# Patient Record
Sex: Male | Born: 1940 | Race: White | Hispanic: No | Marital: Married | Smoking: Never smoker
Health system: Southern US, Community
[De-identification: ages and names within clinical notes are randomized; demographics above are authoritative.]

## PROBLEM LIST (undated history)

## (undated) VITALS — BP 137/74 | HR 76 | Temp 96.8°F | Resp 18 | Ht 68.0 in | Wt 251.0 lb

## (undated) VITALS — BP 136/80 | HR 94 | Temp 97.5°F | Resp 16 | Ht 68.0 in | Wt 256.0 lb

## (undated) DIAGNOSIS — M1990 Unspecified osteoarthritis, unspecified site: Secondary | ICD-10-CM

## (undated) DIAGNOSIS — Z87442 Personal history of urinary calculi: Secondary | ICD-10-CM

## (undated) DIAGNOSIS — I447 Left bundle-branch block, unspecified: Secondary | ICD-10-CM

## (undated) DIAGNOSIS — R7303 Prediabetes: Secondary | ICD-10-CM

## (undated) DIAGNOSIS — Z973 Presence of spectacles and contact lenses: Secondary | ICD-10-CM

## (undated) DIAGNOSIS — Z8614 Personal history of Methicillin resistant Staphylococcus aureus infection: Secondary | ICD-10-CM

## (undated) DIAGNOSIS — K219 Gastro-esophageal reflux disease without esophagitis: Secondary | ICD-10-CM

## (undated) DIAGNOSIS — F319 Bipolar disorder, unspecified: Secondary | ICD-10-CM

## (undated) DIAGNOSIS — F419 Anxiety disorder, unspecified: Secondary | ICD-10-CM

## (undated) DIAGNOSIS — J45909 Unspecified asthma, uncomplicated: Secondary | ICD-10-CM

## (undated) DIAGNOSIS — F329 Major depressive disorder, single episode, unspecified: Secondary | ICD-10-CM

## (undated) DIAGNOSIS — H269 Unspecified cataract: Secondary | ICD-10-CM

## (undated) DIAGNOSIS — IMO0001 Reserved for inherently not codable concepts without codable children: Secondary | ICD-10-CM

## (undated) DIAGNOSIS — I10 Essential (primary) hypertension: Secondary | ICD-10-CM

## (undated) DIAGNOSIS — K602 Anal fissure, unspecified: Secondary | ICD-10-CM

## (undated) DIAGNOSIS — H3530 Unspecified macular degeneration: Secondary | ICD-10-CM

## (undated) DIAGNOSIS — H9190 Unspecified hearing loss, unspecified ear: Secondary | ICD-10-CM

## (undated) DIAGNOSIS — F32A Depression, unspecified: Secondary | ICD-10-CM

## (undated) DIAGNOSIS — T884XXA Failed or difficult intubation, initial encounter: Secondary | ICD-10-CM

## (undated) DIAGNOSIS — E785 Hyperlipidemia, unspecified: Secondary | ICD-10-CM

## (undated) HISTORY — PX: ANKLE SURGERY: SHX546

## (undated) HISTORY — PX: ORIF ELBOW FRACTURE: SHX5031

## (undated) HISTORY — PX: CARDIAC CATHETERIZATION: SHX172

## (undated) HISTORY — DX: Failed or difficult intubation, initial encounter: T88.4XXA

## (undated) HISTORY — PX: INGUINAL HERNIA REPAIR: SHX194

## (undated) HISTORY — DX: Unspecified macular degeneration: H35.30

## (undated) HISTORY — PX: TONSILLECTOMY: SUR1361

## (undated) HISTORY — DX: Gastro-esophageal reflux disease without esophagitis: K21.9

## (undated) HISTORY — DX: Anal fissure, unspecified: K60.2

## (undated) HISTORY — DX: Left bundle-branch block, unspecified: I44.7

## (undated) HISTORY — PX: KNEE SURGERY: SHX244

## (undated) HISTORY — DX: Essential (primary) hypertension: I10

## (undated) HISTORY — DX: Unspecified cataract: H26.9

## (undated) HISTORY — PX: TOTAL KNEE ARTHROPLASTY: SHX125

## (undated) HISTORY — PX: ANKLE FUSION: SHX5718

## (undated) HISTORY — PX: ELBOW SURGERY: SHX618

## (undated) HISTORY — PX: SHOULDER ARTHROSCOPY: SHX128

## (undated) HISTORY — DX: Depression, unspecified: F32.A

## (undated) HISTORY — DX: Bipolar disorder, unspecified: F31.9

## (undated) HISTORY — DX: Major depressive disorder, single episode, unspecified: F32.9

---

## 2003-08-19 ENCOUNTER — Encounter: Admission: RE | Admit: 2003-08-19 | Discharge: 2003-08-19 | Payer: Self-pay | Admitting: Family Medicine

## 2003-11-14 ENCOUNTER — Ambulatory Visit (HOSPITAL_COMMUNITY): Admission: RE | Admit: 2003-11-14 | Discharge: 2003-11-14 | Payer: Self-pay | Admitting: Cardiology

## 2005-03-31 ENCOUNTER — Inpatient Hospital Stay (HOSPITAL_COMMUNITY): Admission: RE | Admit: 2005-03-31 | Discharge: 2005-04-03 | Payer: Self-pay | Admitting: Psychiatry

## 2005-03-31 ENCOUNTER — Ambulatory Visit: Payer: Self-pay | Admitting: Psychiatry

## 2005-04-11 ENCOUNTER — Ambulatory Visit (HOSPITAL_COMMUNITY): Payer: Self-pay | Admitting: Psychiatry

## 2005-04-21 ENCOUNTER — Ambulatory Visit (HOSPITAL_COMMUNITY): Payer: Self-pay | Admitting: Psychiatry

## 2005-05-17 ENCOUNTER — Ambulatory Visit (HOSPITAL_COMMUNITY): Payer: Self-pay | Admitting: Psychiatry

## 2005-05-30 ENCOUNTER — Emergency Department (HOSPITAL_COMMUNITY): Admission: EM | Admit: 2005-05-30 | Discharge: 2005-05-30 | Payer: Self-pay | Admitting: Emergency Medicine

## 2005-06-13 ENCOUNTER — Ambulatory Visit (HOSPITAL_COMMUNITY): Payer: Self-pay | Admitting: Psychiatry

## 2005-07-14 ENCOUNTER — Ambulatory Visit (HOSPITAL_COMMUNITY): Payer: Self-pay | Admitting: Psychiatry

## 2005-09-20 ENCOUNTER — Ambulatory Visit (HOSPITAL_COMMUNITY): Payer: Self-pay | Admitting: Psychiatry

## 2005-10-10 ENCOUNTER — Ambulatory Visit (HOSPITAL_COMMUNITY): Payer: Self-pay | Admitting: Psychiatry

## 2005-11-09 ENCOUNTER — Ambulatory Visit (HOSPITAL_COMMUNITY): Payer: Self-pay | Admitting: Psychiatry

## 2006-01-09 ENCOUNTER — Ambulatory Visit (HOSPITAL_COMMUNITY): Payer: Self-pay | Admitting: Psychiatry

## 2006-02-02 ENCOUNTER — Ambulatory Visit (HOSPITAL_COMMUNITY): Payer: Self-pay | Admitting: Psychiatry

## 2006-03-02 ENCOUNTER — Ambulatory Visit (HOSPITAL_COMMUNITY): Payer: Self-pay | Admitting: Psychiatry

## 2006-04-26 ENCOUNTER — Ambulatory Visit (HOSPITAL_COMMUNITY): Payer: Self-pay | Admitting: Psychiatry

## 2006-05-30 ENCOUNTER — Ambulatory Visit (HOSPITAL_COMMUNITY): Payer: Self-pay | Admitting: Psychiatry

## 2006-07-26 ENCOUNTER — Ambulatory Visit (HOSPITAL_COMMUNITY): Payer: Self-pay | Admitting: Psychiatry

## 2006-09-22 ENCOUNTER — Ambulatory Visit (HOSPITAL_COMMUNITY): Payer: Self-pay | Admitting: Psychiatry

## 2006-10-06 ENCOUNTER — Ambulatory Visit (HOSPITAL_COMMUNITY): Payer: Self-pay | Admitting: Psychiatry

## 2006-11-06 ENCOUNTER — Ambulatory Visit (HOSPITAL_COMMUNITY): Payer: Self-pay | Admitting: Psychiatry

## 2006-12-11 ENCOUNTER — Ambulatory Visit (HOSPITAL_COMMUNITY): Payer: Self-pay | Admitting: Psychiatry

## 2007-02-06 ENCOUNTER — Ambulatory Visit (HOSPITAL_COMMUNITY): Payer: Self-pay | Admitting: Physician Assistant

## 2007-03-06 ENCOUNTER — Ambulatory Visit (HOSPITAL_COMMUNITY): Payer: Self-pay | Admitting: Psychiatry

## 2007-04-10 ENCOUNTER — Ambulatory Visit (HOSPITAL_COMMUNITY): Payer: Self-pay | Admitting: Psychiatry

## 2007-05-17 ENCOUNTER — Ambulatory Visit (HOSPITAL_COMMUNITY): Payer: Self-pay | Admitting: Psychiatry

## 2007-06-11 ENCOUNTER — Ambulatory Visit (HOSPITAL_COMMUNITY): Payer: Self-pay | Admitting: Psychiatry

## 2007-06-18 ENCOUNTER — Ambulatory Visit (HOSPITAL_COMMUNITY): Payer: Self-pay | Admitting: Psychiatry

## 2007-07-10 ENCOUNTER — Ambulatory Visit (HOSPITAL_COMMUNITY): Payer: Self-pay | Admitting: Psychiatry

## 2007-08-01 ENCOUNTER — Ambulatory Visit (HOSPITAL_COMMUNITY): Payer: Self-pay | Admitting: Psychiatry

## 2007-08-31 ENCOUNTER — Ambulatory Visit (HOSPITAL_COMMUNITY): Payer: Self-pay | Admitting: Psychiatry

## 2007-10-26 ENCOUNTER — Ambulatory Visit (HOSPITAL_COMMUNITY): Payer: Self-pay | Admitting: Psychiatry

## 2008-01-07 ENCOUNTER — Ambulatory Visit (HOSPITAL_COMMUNITY): Payer: Self-pay | Admitting: Psychiatry

## 2008-03-05 ENCOUNTER — Ambulatory Visit (HOSPITAL_COMMUNITY): Payer: Self-pay | Admitting: Psychiatry

## 2008-04-12 ENCOUNTER — Emergency Department (HOSPITAL_COMMUNITY): Admission: EM | Admit: 2008-04-12 | Discharge: 2008-04-12 | Payer: Self-pay | Admitting: Emergency Medicine

## 2008-05-14 ENCOUNTER — Ambulatory Visit (HOSPITAL_COMMUNITY): Payer: Self-pay | Admitting: Psychiatry

## 2008-06-27 ENCOUNTER — Ambulatory Visit (HOSPITAL_COMMUNITY): Payer: Self-pay | Admitting: Psychiatry

## 2008-08-06 ENCOUNTER — Ambulatory Visit (HOSPITAL_COMMUNITY): Payer: Self-pay | Admitting: Psychiatry

## 2008-11-04 ENCOUNTER — Ambulatory Visit (HOSPITAL_COMMUNITY): Payer: Self-pay | Admitting: Psychiatry

## 2009-02-24 ENCOUNTER — Ambulatory Visit (HOSPITAL_COMMUNITY): Payer: Self-pay | Admitting: Psychiatry

## 2009-06-17 ENCOUNTER — Ambulatory Visit (HOSPITAL_COMMUNITY): Payer: Self-pay | Admitting: Psychiatry

## 2009-09-30 ENCOUNTER — Ambulatory Visit (HOSPITAL_COMMUNITY): Payer: Self-pay | Admitting: Psychiatry

## 2009-12-29 ENCOUNTER — Ambulatory Visit (HOSPITAL_COMMUNITY): Payer: Self-pay | Admitting: Psychiatry

## 2010-03-23 ENCOUNTER — Ambulatory Visit (HOSPITAL_COMMUNITY): Payer: Self-pay | Admitting: Psychiatry

## 2010-04-15 ENCOUNTER — Ambulatory Visit (HOSPITAL_COMMUNITY): Payer: Self-pay | Admitting: Psychiatry

## 2010-04-23 ENCOUNTER — Ambulatory Visit (HOSPITAL_COMMUNITY): Payer: Self-pay | Admitting: Psychiatry

## 2010-05-07 ENCOUNTER — Ambulatory Visit (HOSPITAL_COMMUNITY): Payer: Self-pay | Admitting: Psychiatry

## 2010-05-11 ENCOUNTER — Ambulatory Visit (HOSPITAL_COMMUNITY): Payer: Self-pay | Admitting: Psychiatry

## 2010-07-30 ENCOUNTER — Ambulatory Visit (HOSPITAL_COMMUNITY): Payer: Self-pay | Admitting: Psychiatry

## 2010-11-23 ENCOUNTER — Encounter (HOSPITAL_COMMUNITY): Payer: 59 | Admitting: Physician Assistant

## 2010-11-23 DIAGNOSIS — F319 Bipolar disorder, unspecified: Secondary | ICD-10-CM

## 2010-12-24 ENCOUNTER — Encounter (HOSPITAL_COMMUNITY): Payer: 59 | Admitting: Physician Assistant

## 2011-01-07 ENCOUNTER — Encounter (HOSPITAL_COMMUNITY): Payer: 59 | Admitting: Physician Assistant

## 2011-01-07 DIAGNOSIS — F39 Unspecified mood [affective] disorder: Secondary | ICD-10-CM

## 2011-01-25 ENCOUNTER — Encounter (HOSPITAL_COMMUNITY): Payer: 59 | Admitting: Physician Assistant

## 2011-01-25 DIAGNOSIS — F39 Unspecified mood [affective] disorder: Secondary | ICD-10-CM

## 2011-01-26 ENCOUNTER — Encounter (HOSPITAL_COMMUNITY): Payer: 59 | Admitting: Physician Assistant

## 2011-02-04 NOTE — Cardiovascular Report (Signed)
NAME:  Sean Hill, Sean Hill                      ACCOUNT NO.:  [id number]   MEDICAL RECORD NO.:  [id number]                   PATIENT TYPE:  OIB   LOCATION:  2854                                 FACILITY:  [facility]   PHYSICIAN:  Salvadore Farber, M.D.             DATE OF BIRTH:  08/14/1941   DATE OF PROCEDURE:  11/14/2003  DATE OF DISCHARGE:                              CARDIAC CATHETERIZATION   PROCEDURE:  Left heart catheterization, left ventriculography, coronary  angiography.   INDICATIONS FOR PROCEDURE:  Mr. Woodfield is a 70 year old gentleman with  chest and arm discomfort occurring with exertion over the past several  months. On Monday he had an episode of similar discomfort  while arguing  with his boss. He was advised to come to the hospital  for inpatient  evaluation  and refused. He was therefore referred for outpatient diagnostic  angiography.  The patient had a cardiac catheterization [id number] reportedly demonstrating  angiographically normal coronary arteries.   DESCRIPTION OF PROCEDURE:  Informed consent was obtained. Under 1% Lidocaine  local anesthesia, a 6 French sheath was placed in the right femoral artery  using the modified Seldinger technique. Diagnostic angiography  and  ventriculography were performed using JL-4, JR-4, Sinclair Ship and  pigtail catheters.   During the procedure the patient experienced chest discomfort  identical to  his presenting symptom. He did experience a catheter-induced spasm of the  right coronary artery. However, his pain began several minutes prior to any  manipulation in the right coronary artery at a time when there was no spasm  in the coronary arteries and TIMI grade 3 flow to the entirety of the distal  vasculature.   While not related to his symptoms, intracoronary nitroglycerin was given to  relieve catheter induced spasm of the native RCA. After ventriculography the  pigtail catheter was pulled back to the aortic root.  Aortography was  performed by power injection.   The patient tolerated the procedure well and was transferred to the holding  room in stable condition. Sheaths will be removed there.   COMPLICATIONS:  None.   FINDINGS:  1. Left ventricular. 112/7/13. Ejection fraction 65% without regional wall     motion abnormality.  2. No aortic stenosis or mitral regurgitation.  3. Left main, angiographically normal.  4. Left anterior descending artery. A large vessel giving rise to a single     large diagonal branch. It is angiographically normal.  5. Circumflex.  A moderate sized vessel giving rise to a single obtuse     marginal. There was an ostial 20% lesion.  6. Right coronary artery. Large dominant vessel. It is angiographically     normal.   IMPRESSION/RECOMMENDATIONS:  1. Minimal coronary disease.  2. Normal left ventricular size and systolic  function.  3. No aortic stenosis of mitral regurgitation.  4. Arch aortogram, no evidence of aortic dissection. The left common carotid     arises from the  innominate artery. The left vertebral likely arises from     the arch just proximal to the takeoff of the left subclavian.   During the procedure the patient had chest pain identical to his presenting  pain. This clearly preceded the onset of any catheter induced spasm. There  were no electrocardiographic changes during this pain on monitor. Thus I  think we can definitively state that there is no evidence of cardiac  etiology to his chest discomfort. With the mild stenosis seen in the  circumflex, would proceed with aggressive primary prevention including  continued aspirin therapy.                                               Salvadore Farber, M.D.    WED/MEDQ  D:  11/14/2003  T:  11/15/2003  Job:  [id number]   cc:   Quita Skye. Artis Flock, M.D.  [address], [address]  [place]  [place] [address]  Fax: [phone]   Jesse Sans. Wall, M.D.

## 2011-02-04 NOTE — Discharge Summary (Signed)
NAME:  Sean Hill, Sean Hill NO.:  [id number]   MEDICAL RECORD NO.:  [id number]          PATIENT TYPE:  IPS   LOCATION:  0302                          FACILITY:  [facility]   PHYSICIAN:  Jeanice Lim, M.D. 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  Increased depression for 4-6 weeks,  neurovegetative symptoms, unable to sleep.  First inpatient treatment, first  psychiatric treatment, history of prior suicide attempt years ago by  overdose.   ADMISSION MEDICATIONS:  Zantac.   ALLERGIES:  PENICILLIN, CODEINE.   PHYSICAL AND NEUROLOGICAL EXAMINATION:  Multiple tattoos.  Otherwise within  normal limits.   ROUTINE ADMISSION LABS:  Within normal limits.   MENTAL STATUS EXAM:  Fully alert.  Affect some flattening and psychomotor  slowing.  Mood depressed, affect restricted.  Thought process positive for  suicidal ideation with plan.  Contracts for safety on the unit.  No  psychotic symptoms reported.  Cognitively intact.  Judgment and insight  impaired.   ADMISSION DIAGNOSES:  AXIS I:  Rule out bipolar disorder type 2.  AXIS II:  Deferred.  AXIS III:  Degenerative joint disease.  AXIS IV:  Severe stress with chronic psychosocial issues and medical  problems.  AXIS V:  35/65.   HOSPITAL COURSE:  The patient was admitted and ordered routine p.r.n.  medications, underwent further monitoring, and was encouraged to participate  in individual, group and milieu therapy.  Risperdal was started at 0.25 q.6  p.r.n., 0.5 q.h.s.  Weapons were followed up regarding securing weapons.  The patient described mood swings, obsessions with  computer games, driving  90 miles an hour, was irritable.  History of hypomanic episodes.  Family was  contacted for mobilizing support system and developing aftercare plan.  Had  difficulty sleeping.  Reported eventually tolerating medications and getting  a positive response and was discharged in improved condition with no  dangerous ideation or psychotic symptoms.  Mood more stable.  Given  medication education and discharged on:  1.  Trazodone 50 mg 1/2 to 2 one hour before sleep as needed, 6-8 for      consistent sleeping.  2.  Ambien 10 mg q.h.s. p.r.n. insomnia.  3.  Claritin 10 mg q.a.m.  4.  Lamictal 25 mg q.a.m. until July 28, then 2 q.a.m.  5.  Risperdal 0.5 1.5 q.8 p.m. and 1 hour before sleep.   DISPOSITION:  The patient was discharged in improved condition with no risk  issues.   DISCHARGE DIAGNOSES:  AXIS I:  Rule out bipolar disorder type 2.  AXIS II:  Deferred.  AXIS III:  Degenerative joint disease.  AXIS IV:  Severe stress with chronic psychosocial issues and medical  problems.  AXIS V:  Global assessment of function on discharge was  60.  Condition was  improved.      Jeanice Lim, M.D.  Electronically Signed     JEM/MEDQ  D:  05/29/2005  T:  05/29/2005  Job:  [id number]

## 2011-02-11 ENCOUNTER — Encounter (HOSPITAL_COMMUNITY): Payer: 59 | Admitting: Physician Assistant

## 2011-02-17 ENCOUNTER — Encounter (HOSPITAL_COMMUNITY): Payer: 59 | Admitting: Psychiatry

## 2011-02-17 DIAGNOSIS — F329 Major depressive disorder, single episode, unspecified: Secondary | ICD-10-CM

## 2011-02-17 DIAGNOSIS — F3289 Other specified depressive episodes: Secondary | ICD-10-CM

## 2011-03-04 ENCOUNTER — Encounter (HOSPITAL_COMMUNITY): Payer: 59 | Admitting: Psychiatry

## 2011-03-04 DIAGNOSIS — F3130 Bipolar disorder, current episode depressed, mild or moderate severity, unspecified: Secondary | ICD-10-CM

## 2011-03-04 DIAGNOSIS — F4010 Social phobia, unspecified: Secondary | ICD-10-CM

## 2011-03-08 ENCOUNTER — Ambulatory Visit (HOSPITAL_BASED_OUTPATIENT_CLINIC_OR_DEPARTMENT_OTHER)
Admission: RE | Admit: 2011-03-08 | Discharge: 2011-03-08 | Disposition: A | Discharge status: Home / Self Care | Payer: Medicare PPO | Source: Ambulatory Visit | Attending: Family Medicine | Admitting: Family Medicine

## 2011-03-08 ENCOUNTER — Ambulatory Visit (INDEPENDENT_AMBULATORY_CARE_PROVIDER_SITE_OTHER): Payer: 59 | Admitting: Family Medicine

## 2011-03-08 ENCOUNTER — Encounter: Payer: Self-pay | Admitting: Family Medicine

## 2011-03-08 DIAGNOSIS — G8929 Other chronic pain: Secondary | ICD-10-CM

## 2011-03-08 DIAGNOSIS — F319 Bipolar disorder, unspecified: Secondary | ICD-10-CM

## 2011-03-08 DIAGNOSIS — Z79899 Other long term (current) drug therapy: Secondary | ICD-10-CM

## 2011-03-08 DIAGNOSIS — M542 Cervicalgia: Secondary | ICD-10-CM | POA: Insufficient documentation

## 2011-03-08 DIAGNOSIS — M47812 Spondylosis without myelopathy or radiculopathy, cervical region: Secondary | ICD-10-CM

## 2011-03-08 DIAGNOSIS — K5732 Diverticulitis of large intestine without perforation or abscess without bleeding: Secondary | ICD-10-CM

## 2011-03-08 DIAGNOSIS — K5792 Diverticulitis of intestine, part unspecified, without perforation or abscess without bleeding: Secondary | ICD-10-CM

## 2011-03-08 LAB — COMPREHENSIVE METABOLIC PANEL
ALT: 32 U/L (ref 0–53)
AST: 35 U/L (ref 0–37)
Albumin: 4.5 g/dL (ref 3.5–5.2)
Alkaline Phosphatase: 76 U/L (ref 39–117)
BUN: 10 mg/dL (ref 6–23)
CO2: 22 mEq/L (ref 19–32)
Calcium: 9.5 mg/dL (ref 8.4–10.5)
Chloride: 103 mEq/L (ref 96–112)
Creat: 0.87 mg/dL (ref 0.50–1.35)
Glucose, Bld: 85 mg/dL (ref 70–99)
Potassium: 4.3 mEq/L (ref 3.5–5.3)
Sodium: 138 mEq/L (ref 135–145)
Total Bilirubin: 0.5 mg/dL (ref 0.3–1.2)
Total Protein: 7.2 g/dL (ref 6.0–8.3)

## 2011-03-08 LAB — CBC WITH DIFFERENTIAL/PLATELET
Basophils Absolute: 0 10*3/uL (ref 0.0–0.1)
Basophils Relative: 1 % (ref 0–1)
Eosinophils Absolute: 0.1 10*3/uL (ref 0.0–0.7)
Eosinophils Relative: 2 % (ref 0–5)
HCT: 47.8 % (ref 39.0–52.0)
Hemoglobin: 16 g/dL (ref 13.0–17.0)
Lymphocytes Relative: 38 % (ref 12–46)
Lymphs Abs: 2.2 10*3/uL (ref 0.7–4.0)
MCH: 28.3 pg (ref 26.0–34.0)
MCHC: 33.5 g/dL (ref 30.0–36.0)
MCV: 84.6 fL (ref 78.0–100.0)
Monocytes Absolute: 0.6 10*3/uL (ref 0.1–1.0)
Monocytes Relative: 10 % (ref 3–12)
Neutro Abs: 2.9 10*3/uL (ref 1.7–7.7)
Neutrophils Relative %: 49 % (ref 43–77)
Platelets: 258 10*3/uL (ref 150–400)
RBC: 5.65 MIL/uL (ref 4.22–5.81)
RDW: 15.4 % (ref 11.5–15.5)
WBC: 5.8 10*3/uL (ref 4.0–10.5)

## 2011-03-08 MED ORDER — TRAMADOL HCL 50 MG PO TABS: ORAL_TABLET | ORAL | Status: DC

## 2011-03-08 MED ORDER — METRONIDAZOLE 500 MG PO TABS: 500.0000 mg | ORAL_TABLET | Freq: Three times a day (TID) | ORAL | Status: AC

## 2011-03-08 MED ORDER — CIPROFLOXACIN HCL 500 MG PO TABS: 500.0000 mg | ORAL_TABLET | Freq: Two times a day (BID) | ORAL | Status: AC

## 2011-03-08 NOTE — Assessment & Plan Note (Signed)
Start tramadol 50mg , 1-2 q6h prn. Check c-spine x-rays first.  Proceed to MRI if significant abnormalities noted. Likely will refer to neurosurgeon after this.

## 2011-03-08 NOTE — Assessment & Plan Note (Addendum)
We are making the assumption now that his hip pain is referred pain from his diverticular disease. Will treat with cipro and flagyl as per orders today. Discussed dx, diet changes. Check CBC and CMET today.

## 2011-03-08 NOTE — Progress Notes (Addendum)
Office Note 03/08/2011  CC:  Chief Complaint  Patient presents with  . Establish Care  . Hip Pain    bilateral hip pain for the past 5-6 months  . Neck Pain    numbness and sharp pain with numbing    HPI:  Sean Hill is a 70 y.o. White male who is here to establish care and discuss neck pain and hip pain. Patient's most recent primary MD: none.  Orthopedist: Dr. Dimas Aguas.  Psychiatrist: Dr. Dora Sims at [facility]. Old records were not reviewed prior to or during today's visit.  Neck pain x 20+ yrs, worse the last few years, now with chronic numbness and pain radiating into top of right shoulder/trapezius area.  No history of acute neck injury or trauma to neck.  Pain intensity 7/10 most of the time.  No radiation into arms, no weakness.  +occipital headaches chronic lately.  Says he got x-rays a few years ago and was told he had some DDD ? Spinal stenosis, went through some PT and did cervical traction but none of this helped.  No surgery or injections.  Can't tolerate anti-inflammitories or tylenol due to GI side effects. He asks for re-evaluation of his neck problem +/- referral to specialist.  Also describes right hip pains that he was seeing his orthopedist for, waxing and waning intensity, injection no help, PT no help. MRI was done that revealed no hip abnormality but did show diverticulitis.  This MRI was about 3 wks ago and he was told to f/u with a PCP for the diverticulitis, no meds rx'd.  Past Medical History  Diagnosis Date  . Bipolar disorder     Has psychiatrist  . Hyperlipemia     Intol of meds: GI side effects  . 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  No hx of MRSA infection.  . 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 Ankle fracture surgery     hardware still in both ankles (titanium)  . Inguinal hernia  repair     Right side with mesh about 1993  . Elbow surgery     left---tendon  . Ankle surgery     Ankle tendon surgery    No family history on file.  History   Social History  . Marital Status: Married    Spouse Name: N/A    Number of Children: N/A  . Years of Education: N/A   Occupational History  . Not on file.   Social History Main Topics  . Smoking status: Never Smoker   . Smokeless tobacco: Not on file  . Alcohol Use: No  . Drug Use: No  . Sexually Active: Not on file   Other Topics Concern  . Not on file   Social History Narrative   Married, lives in [place].  Retired Quarry manager.Two daughters, both married, 2 grandchildren.No regular exercise.  Never smoker.  No ETOH.  No drugs.   NOTE: pt is NOT taking metronidazole and cipro as listed below (these were rx'd AFTER this encounter) Outpatient Encounter Prescriptions as of 03/08/2011  Medication Sig Dispense Refill  . Chlorphen-Pseudoeph-Methscop (ALLERGY AM/PM PO) Take by mouth 2 (two) times daily.        . clonazePAM (KLONOPIN) 1 MG tablet Take 1 mg by mouth at bedtime.        Marland Kitchen  divalproex (DEPAKOTE) 500 MG EC tablet Take 500 mg by mouth at bedtime.        . traZODone (DESYREL) 100 MG tablet Take 100 mg by mouth at bedtime.        Marland Kitchen venlafaxine (EFFEXOR) 100 MG tablet Take 100 mg by mouth 2 (two) times daily.        . ciprofloxacin (CIPRO) 500 MG tablet Take 1 tablet (500 mg total) by mouth 2 (two) times daily.  28 tablet  0  . metroNIDAZOLE (FLAGYL) 500 MG tablet Take 1 tablet (500 mg total) by mouth 3 (three) times daily.  42 tablet  0  . traMADol (ULTRAM) 50 MG tablet 1-2 tabs po q6h prn pain  30 tablet  1  Metamucil qd Dulcolax prn  Allergies  Allergen Reactions  . Penicillins     Hives and watery blisters    ROS Review of Systems  Constitutional: Negative for fever, chills, appetite change and fatigue.  HENT: Positive for neck pain (as per HPI) and neck stiffness. Negative for ear pain,  congestion, sore throat and dental problem.   Eyes: Negative for discharge, redness and visual disturbance.  Respiratory: Negative for cough, chest tightness, shortness of breath and wheezing.   Cardiovascular: Negative for chest pain, palpitations and leg swelling.  Gastrointestinal: Positive for constipation. Negative for nausea, vomiting, abdominal pain, diarrhea and blood in stool.  Genitourinary: Negative for dysuria, urgency, frequency, hematuria, flank pain and difficulty urinating.  Musculoskeletal: Positive for back pain (chronic low back) and arthralgias (hips). Negative for myalgias and joint swelling.  Skin: Negative for pallor and rash.  Neurological: Positive for headaches (occipital). Negative for dizziness, speech difficulty and weakness.  Hematological: Negative for adenopathy. Does not bruise/bleed easily.  Psychiatric/Behavioral: Negative for confusion and sleep disturbance. The patient is not nervous/anxious.     PE; Blood pressure 130/70, pulse 90, temperature 98.4 F (36.9 C), temperature source Oral, resp. rate 22, height 5\' 8"  (1.727 m), weight 259 lb (117.482 kg), SpO2 91.00%. Gen: Alert, well appearing.  Patient is oriented to person, place, time, and situation. HEENT: Scalp without lesions or hair loss.  Ears: EACs clear, normal epithelium.  TMs with good light reflex and landmarks bilaterally.  Eyes: no injection, icteris, swelling, or exudate.  EOMI, PERRLA. Nose: no drainage or turbinate edema/swelling.  No injection or focal lesion.  Mouth: lips without lesion/swelling.  Oral mucosa pink and moist.  Dentition intact and without obvious caries or gingival swelling.  Oropharynx without erythema, exudate, or swelling.  Neck: supple, ROM full.  Carotids 2+ bilat, without bruit.  No lymphadenopathy, thyromegaly, or mass. Chest: symmetric expansion, nonlabored respirations.  Clear and equal breath sounds in all lung fields.   CV: RRR, no m/r/g.  Peripheral pulses 2+ and  symmetric. ABD: soft, NT, ND, BS normal.  No hepatospenomegaly or mass.  No bruits. EXT: no clubbing, cyanosis, or edema.  Neck: no TTP.  ROM mildly limited in flexion/extension.  Limited lateral bending and rotation.  Spurlings neg. UE strength and sensation intact.   DTRs undetectable in all areas of both UEs and LEs.  Pertinent labs:  none  ASSESSMENT AND PLAN:  New patient: need to gather old records.  Neck pain, chronic Start tramadol 50mg , 1-2 q6h prn. Check c-spine x-rays first.  Proceed to MRI if significant abnormalities noted. Likely will refer to neurosurgeon after this.  Diverticulitis We are making the assumption now that his hip pain is referred pain from his diverticular disease. Will treat with cipro  and flagyl as per orders today. Discussed dx, diet changes. Check CBC and CMET today.     Return in about 4 weeks (around 04/05/2011).    ADDENDUM:  Reviewed records from orthopedist today and apparently his pelvic and left hip MRI 02/2011 showed a small left inguinal hernia with fat, as well as sigmoid diverticulosis and bilat L4-5 facet hypertrophy. Based on this info, it is unlikely that the antibiotics I rx'd at this encounter will help his hip pain.  Will discuss gen surg referral with patient at f/u. Also need to consider getting colonoscopy.  If still nothing to explain his left groin/hip pain after these things, then will discuss getting L-spine MRI with patient.--PM

## 2011-03-09 ENCOUNTER — Telehealth: Payer: Self-pay | Admitting: Family

## 2011-03-09 ENCOUNTER — Telehealth: Payer: Self-pay | Admitting: Family Medicine

## 2011-03-09 NOTE — Telephone Encounter (Signed)
Call returned to patient [phone] states that he would like to proceed with the MRI that will be scheduled by our office. He states that he has received a letter in the mail regarding a appointment with [facility] for July 10 th, 2012 with  Hali Marry, [name]. He asked that when his testing is complete if the results could be sent to [facility]. He was informed that our office would call him once the MRI has been scheduled. Patient has verbalized understanding and wait to hear back regarding MRI appointment date and time

## 2011-03-09 NOTE — Telephone Encounter (Signed)
Faxed to [facility]

## 2011-03-09 NOTE — Telephone Encounter (Signed)
Pt states he was returning your phone call??

## 2011-03-09 NOTE — Telephone Encounter (Signed)
Please request records from Dr. Dimas Aguas (or [facility]?) in [facility] (he's an orthopedist).  Thanks--PM

## 2011-03-16 ENCOUNTER — Ambulatory Visit (HOSPITAL_BASED_OUTPATIENT_CLINIC_OR_DEPARTMENT_OTHER)
Admission: RE | Admit: 2011-03-16 | Discharge: 2011-03-16 | Disposition: A | Discharge status: Home / Self Care | Payer: Medicare PPO | Source: Ambulatory Visit | Attending: Family Medicine | Admitting: Family Medicine

## 2011-03-16 DIAGNOSIS — M5030 Other cervical disc degeneration, unspecified cervical region: Secondary | ICD-10-CM | POA: Insufficient documentation

## 2011-03-16 DIAGNOSIS — M542 Cervicalgia: Secondary | ICD-10-CM

## 2011-03-16 DIAGNOSIS — G8929 Other chronic pain: Secondary | ICD-10-CM

## 2011-03-18 ENCOUNTER — Other Ambulatory Visit: Payer: Self-pay | Admitting: Family Medicine

## 2011-03-18 DIAGNOSIS — M47812 Spondylosis without myelopathy or radiculopathy, cervical region: Secondary | ICD-10-CM

## 2011-03-18 DIAGNOSIS — M542 Cervicalgia: Secondary | ICD-10-CM

## 2011-03-18 DIAGNOSIS — G8929 Other chronic pain: Secondary | ICD-10-CM

## 2011-03-21 ENCOUNTER — Encounter (HOSPITAL_COMMUNITY): Payer: 59 | Admitting: Psychiatry

## 2011-03-25 ENCOUNTER — Encounter (HOSPITAL_COMMUNITY): Payer: 59 | Admitting: Psychiatry

## 2011-03-25 DIAGNOSIS — F3130 Bipolar disorder, current episode depressed, mild or moderate severity, unspecified: Secondary | ICD-10-CM

## 2011-03-25 DIAGNOSIS — F4010 Social phobia, unspecified: Secondary | ICD-10-CM

## 2011-03-28 ENCOUNTER — Encounter: Payer: Self-pay | Admitting: Family Medicine

## 2011-04-04 ENCOUNTER — Encounter (HOSPITAL_COMMUNITY): Payer: 59 | Admitting: Psychiatry

## 2011-04-04 DIAGNOSIS — F3130 Bipolar disorder, current episode depressed, mild or moderate severity, unspecified: Secondary | ICD-10-CM

## 2011-04-04 DIAGNOSIS — F4010 Social phobia, unspecified: Secondary | ICD-10-CM

## 2011-04-07 ENCOUNTER — Ambulatory Visit: Payer: 59 | Admitting: Family Medicine

## 2011-04-11 ENCOUNTER — Encounter (HOSPITAL_COMMUNITY): Payer: 59 | Admitting: Psychiatry

## 2011-04-11 DIAGNOSIS — F4010 Social phobia, unspecified: Secondary | ICD-10-CM

## 2011-04-11 DIAGNOSIS — F3130 Bipolar disorder, current episode depressed, mild or moderate severity, unspecified: Secondary | ICD-10-CM

## 2011-04-19 ENCOUNTER — Encounter (HOSPITAL_COMMUNITY): Payer: 59 | Admitting: Physician Assistant

## 2011-04-19 DIAGNOSIS — F319 Bipolar disorder, unspecified: Secondary | ICD-10-CM

## 2011-05-09 ENCOUNTER — Encounter (HOSPITAL_COMMUNITY): Payer: Medicare PPO | Admitting: Psychiatry

## 2011-05-09 ENCOUNTER — Encounter (HOSPITAL_COMMUNITY): Payer: 59 | Admitting: Psychiatry

## 2011-05-09 DIAGNOSIS — F3130 Bipolar disorder, current episode depressed, mild or moderate severity, unspecified: Secondary | ICD-10-CM

## 2011-05-09 DIAGNOSIS — F4010 Social phobia, unspecified: Secondary | ICD-10-CM

## 2011-05-18 ENCOUNTER — Other Ambulatory Visit: Payer: Self-pay | Admitting: *Deleted

## 2011-05-18 ENCOUNTER — Encounter: Payer: Self-pay | Admitting: Family Medicine

## 2011-05-18 ENCOUNTER — Ambulatory Visit (INDEPENDENT_AMBULATORY_CARE_PROVIDER_SITE_OTHER): Payer: Medicare PPO | Admitting: Family Medicine

## 2011-05-18 VITALS — BP 132/80 | HR 84 | Wt 262.0 lb

## 2011-05-18 DIAGNOSIS — M26629 Arthralgia of temporomandibular joint, unspecified side: Secondary | ICD-10-CM

## 2011-05-18 DIAGNOSIS — R1032 Left lower quadrant pain: Secondary | ICD-10-CM

## 2011-05-18 DIAGNOSIS — R609 Edema, unspecified: Secondary | ICD-10-CM

## 2011-05-18 DIAGNOSIS — G8929 Other chronic pain: Secondary | ICD-10-CM

## 2011-05-18 DIAGNOSIS — K589 Irritable bowel syndrome without diarrhea: Secondary | ICD-10-CM

## 2011-05-18 DIAGNOSIS — M542 Cervicalgia: Secondary | ICD-10-CM

## 2011-05-18 LAB — POCT URINALYSIS DIPSTICK
Bilirubin, UA: NEGATIVE
Blood, UA: NEGATIVE
Glucose, UA: NEGATIVE
Ketones, UA: NEGATIVE
Leukocytes, UA: NEGATIVE
Nitrite, UA: NEGATIVE
Protein, UA: NEGATIVE
Spec Grav, UA: 1.01
Urobilinogen, UA: 1
pH, UA: 7

## 2011-05-18 MED ORDER — ZOSTER VACCINE LIVE 19400 UNT/0.65ML ~~LOC~~ SOLR: 0.6500 mL | Freq: Once | SUBCUTANEOUS | Status: DC

## 2011-05-18 NOTE — Progress Notes (Signed)
OFFICE VISIT  05/19/2011   CC:  Chief Complaint  Patient presents with  . Foot Swelling    bilateral  . earache/toothache    left side  . Shortness of Breath     HPI:    Patient is a 70 y.o. Caucasian male who presents for left ear/jaw pain. About 10-15d ago he developed pain in left lower molar area, cold sensitivity.  No fever. Went to dentist in [place] (Dr. Hanley Seamen) and eval was normal there (x-ray too) per pt report.  Was rx'd 10d course of clindamycin. Feels like it is not significantly better.  The pain seems to be more in the area of the TMJ/ear/mandible now.  Has chronic left sided tinnitis with some associated chronic hearing loss--bothering him worse lately with current pain on that side. No ST, no jaw swelling.  ROS: feels "out of shape"--has started trying to get into exercise lately on stationary bike, feels easily winded and mild malaise with this.  No chest pain, no dyspnea at rest, no cough.  ? Of some intermittent wheezing. Has chronic left groin/hip area pain still, mild and nagging.  No problems eating.  Says he feels like hands and feet swell a lot of the time--chronic problem without acute worsening.  Says he watches salt intake. Still with chronic nagging neck pain--says he was told by [facility] in [place] that they reviewed his MRI (but did not see him in the office per pt report) and told him that he was not a surgical candidate and that his best next option would be a pain management center.   Says he has had some postprandial BMs, bloating, mild abd pain over the last couple of months.  Seems to have come after finishing cipro/flagyl for what we thought was diverticulitis (see past 2 office notes of mine).  However, he seems to have had this to some degree for over a year, perhaps a few years, hard to tell.  No blood or mucous.  Appetite is fine.  No nocturnal BMs/diarrhea.  This is not tied to any specific food types. Also, he stopped his valproate for a  while recently but had to restart b/c he got into a manic phase.  He expresses some financial concerns and says he has to minimize medical costs as much as possible the rest of this year, plans on pursuing a few things next year.  Past Medical History  Diagnosis Date  . Bipolar disorder     Has psychiatrist  . Hyperlipemia     Intol of meds: GI side effects  . 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  No hx of MRSA infection.  . 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 Depression   . Nephrolithiasis   . GERD (gastroesophageal reflux disease)   . Inguinal hernia 02/25/11    Left sided hernia with fat  . Hip pain, left 2011-2012    Left hip and groin: MRI pelvis and left hip 02/18/11 showed NORMAL hip, with small inguinal hernia containing fat and sigmoid diverticulosis.  Hip pain presumably referred pain from hernia/diverticular dz??.  . Cervical spondylosis     Primarily C4-5 and C5-6--referred to [facility] 02/2011.    Past Surgical History  Procedure Date  . 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 Ankle fracture surgery     hardware still in both ankles (titanium)  .  Inguinal hernia repair     Right side with mesh about 1993  . Elbow surgery     left---tendon  . Ankle surgery     Ankle tendon surgery    Outpatient Prescriptions Prior to Visit  Medication Sig Dispense Refill  . Chlorphen-Pseudoeph-Methscop (ALLERGY AM/PM PO) Take by mouth 2 (two) times daily as needed.       . clonazePAM (KLONOPIN) 1 MG tablet Take 1 mg by mouth at bedtime.        . divalproex (DEPAKOTE) 500 MG EC tablet Take 500 mg by mouth at bedtime.        . traMADol (ULTRAM) 50 MG tablet 1-2 tabs po q6h prn pain  30 tablet  1  . traZODone (DESYREL) 100 MG tablet Take 100 mg by mouth at bedtime.        Marland Kitchen venlafaxine (EFFEXOR) 100 MG tablet Take 100 mg by mouth 2 (two) times daily.          Allergies  Allergen  Reactions  . Penicillins     Hives and watery blisters    ROS As per HPI  PE: Blood pressure 132/80, pulse 84, weight 262 lb (118.842 kg), SpO2 98.00%. Gen: Alert, well appearing.  Patient is oriented to person, place, time, and situation. HEENT: Scalp without lesions or hair loss.  Ears: EACs clear, normal epithelium.  TMs with good light reflex and landmarks bilaterally.  Eyes: no injection, icteris, swelling, or exudate.  EOMI, PERRLA. Nose: no drainage or turbinate edema/swelling.  No injection or focal lesion.  Mouth: lips without lesion/swelling.  Oral mucosa pink and moist.  NO gingival erythema or swelling or tenderness.  Several teeth are discolored and eroded but none are sensitive to touch with toungue blade.  Oropharynx without erythema, exudate, or swelling.  His left mandibular and especially left TMJ area are TTP and has mild laxity. Neck: supple, ROM full.  Carotids 2+ bilat, without bruit.  No lymphadenopathy, thyromegaly, or mass. Chest: symmetric expansion, nonlabored respirations.  Clear and equal breath sounds in all lung fields.   CV: RRR, no m/r/g.  Peripheral pulses 2+ and symmetric. ABD: soft, NT, ND, BS normal.  No hepatospenomegaly or mass.  No bruits.   Mild tenderness to palpation in left groin area but no mass palpable.  This same area is mildly uncomfortable to him with passive flexion of left hip and ER/IR of left hip. EXT: no clubbing or cyanosis.  Trace pitting edema from ankles to mid tibial area bilat.   No tenderness or erythema.  LABS:  CC UA today NORMAL. Normal CBC and CMET on 03/08/11.  IMPRESSION AND PLAN:  Irritable bowel Start trial of levsin 0.125mg , 1-2 tabs q4h prn. He wants to shoot for getting a colonoscopy next year (financial reasons)--for screening (also, diverticulosis was noted on MRI done of L-hip recently).   Edema I see no sign of significant fluid balance issues today. UA w/out protein. Reassured patient.  TMJ  arthralgia Discussed prn tylenol, possible dental visit for custom fitting of mouth piece/guard. Will get dental records (x-ray)  from recent visit in [place]. I really don't see any evidence suggestive of dental abscess or other infection.  Neck pain, chronic Stable, monitor for now, continue tylenol prn.  Never actually saw specialist that I referred him to. I told him he needs a more formal neurosurgery or pain management evaluation in the near future if pain and disability persists.  Left groin pain Stable, seems to wax and wane some.  Seems to possibly correlate with small inguinal hernia noted on the MRI done of his left hip recently.  For financial reasons he wants to defer surgical referral until 2013.   Pt did request zostavax today so I gave rx and he'll take it to pharmacy for dispensation and administration.  FOLLOW UP: Return if symptoms worsen or fail to improve.

## 2011-05-19 DIAGNOSIS — R609 Edema, unspecified: Secondary | ICD-10-CM | POA: Insufficient documentation

## 2011-05-19 DIAGNOSIS — R1032 Left lower quadrant pain: Secondary | ICD-10-CM | POA: Insufficient documentation

## 2011-05-19 DIAGNOSIS — M26629 Arthralgia of temporomandibular joint, unspecified side: Secondary | ICD-10-CM | POA: Insufficient documentation

## 2011-05-19 DIAGNOSIS — K589 Irritable bowel syndrome without diarrhea: Secondary | ICD-10-CM | POA: Insufficient documentation

## 2011-05-19 NOTE — Assessment & Plan Note (Signed)
Stable, monitor for now, continue tylenol prn.  Never actually saw specialist that I referred him to. I told him he needs a more formal neurosurgery or pain management evaluation in the near future if pain and disability persists.

## 2011-05-19 NOTE — Assessment & Plan Note (Signed)
Stable, seems to wax and wane some. Seems to possibly correlate with small inguinal hernia noted on the MRI done of his left hip recently.  For financial reasons he wants to defer surgical referral until 2013.

## 2011-05-19 NOTE — Assessment & Plan Note (Signed)
I see no sign of significant fluid balance issues today. UA w/out protein. Reassured patient.

## 2011-05-19 NOTE — Assessment & Plan Note (Signed)
Discussed prn tylenol, possible dental visit for custom fitting of mouth piece/guard. Will get dental records (x-ray)  from recent visit in [place]. I really don't see any evidence suggestive of dental abscess or other infection.

## 2011-05-19 NOTE — Assessment & Plan Note (Addendum)
Start trial of levsin 0.125mg , 1-2 tabs q4h prn. He wants to shoot for getting a colonoscopy next year (financial reasons)--for screening (also, diverticulosis was noted on MRI done of L-hip recently).

## 2011-06-13 ENCOUNTER — Encounter (HOSPITAL_COMMUNITY): Payer: Medicare PPO | Admitting: Psychiatry

## 2011-06-17 LAB — DIFFERENTIAL
Basophils Absolute: 0.1
Basophils Relative: 1
Eosinophils Absolute: 0.2
Eosinophils Relative: 2
Lymphocytes Relative: 24
Lymphs Abs: 1.8
Monocytes Absolute: 0.8
Monocytes Relative: 10
Neutro Abs: 4.8
Neutrophils Relative %: 63
Smear Review: ADEQUATE

## 2011-06-17 LAB — COMPREHENSIVE METABOLIC PANEL
ALT: 34
AST: 39 — ABNORMAL HIGH
Albumin: 3.9
Alkaline Phosphatase: 92
BUN: 7
CO2: 22
Calcium: 9.7
Chloride: 111
Creatinine, Ser: 0.9
GFR calc Af Amer: >60
GFR calc non Af Amer: >60
Glucose, Bld: 94
Potassium: 4.9
Sodium: 141
Total Bilirubin: 1.2
Total Protein: 6.5

## 2011-06-17 LAB — CBC
HCT: 49
Hemoglobin: 16.8
MCHC: 34.2
MCV: 85.8
Platelets: 178
RBC: 5.71
RDW: 14
WBC: 7.7

## 2011-06-17 LAB — D-DIMER, QUANTITATIVE: D-Dimer, Quant: 0.73 — ABNORMAL HIGH

## 2011-06-29 ENCOUNTER — Encounter (HOSPITAL_COMMUNITY): Payer: Medicare PPO | Admitting: Physician Assistant

## 2011-07-18 ENCOUNTER — Telehealth: Payer: Self-pay | Admitting: Family

## 2011-07-18 NOTE — Telephone Encounter (Signed)
I've left a message for the referral coordinator at [facility] to call me back to see what the status is of this referral. I also spoke with Dr Nash Dimmer office. He does see patients for neck pain but does not do surgery.

## 2011-07-18 NOTE — Telephone Encounter (Signed)
Pt was supposed to be referred to [facility] in June.  Referral was entered and info was faxed to them in July.  Pt states appt was never made.  Pt is in a lot of pain and he does not care who appt is made with.  Pt can go anytime/anyday.

## 2011-07-18 NOTE — Telephone Encounter (Signed)
Please investigate this, his initial referral was made in July to [facility] but somehow was never completed. Please try and facilitate for him secondary to worsening pain in necl

## 2011-07-18 NOTE — Telephone Encounter (Signed)
Patient called back to request Dr. Abner Greenspan to [facility] the referral to Dr. Modesto Charon office because he is in a lot of pain and they will not schedule it without a referral.

## 2011-07-19 ENCOUNTER — Telehealth: Payer: Self-pay | Admitting: Family Medicine

## 2011-07-19 MED ORDER — GABAPENTIN 300 MG PO CAPS: ORAL_CAPSULE | ORAL | Status: DC

## 2011-07-19 MED ORDER — HYDROCODONE-ACETAMINOPHEN 5-325 MG PO TABS: ORAL_TABLET | ORAL | Status: DC

## 2011-07-19 NOTE — Telephone Encounter (Signed)
[facility] called back, they do not take patient's insurance, I can set up the referral with Dr Modesto Charon or at [facility] do you have a preference?

## 2011-07-19 NOTE — Telephone Encounter (Signed)
I'm going to put in a referral order for this patient but need to see who has the earliest open appt.  We talked with Dr. Oneal Grout and she can see him Dec 1 at the earliest. Will you call [facility] at [phone] [facility]) and see when their first available opening is (for neck pain and degenerative disc disease).  Their web site says they accept his insurance [facility]). Let me know, then I'll decide where to refer and I'll put the order in EPIC.  Thx!

## 2011-07-19 NOTE — Telephone Encounter (Signed)
Spoke w/ pt on phone about ongoing neck pain and paresthesias into shoulder/arm. Will refer to either neurosurgeon or interventional pain MD and I also will start him on neurontin and prn vicodin.

## 2011-07-19 NOTE — Telephone Encounter (Signed)
Please advise 

## 2011-07-20 ENCOUNTER — Inpatient Hospital Stay (HOSPITAL_COMMUNITY)
Admission: RE | Admit: 2011-07-20 | Discharge: 2011-07-29 | DRG: 885 | Disposition: A | Discharge status: Home / Self Care | Payer: Medicare PPO | Attending: Psychiatry | Admitting: Psychiatry

## 2011-07-20 DIAGNOSIS — F339 Major depressive disorder, recurrent, unspecified: Principal | ICD-10-CM

## 2011-07-20 DIAGNOSIS — Z880 Allergy status to penicillin: Secondary | ICD-10-CM

## 2011-07-20 DIAGNOSIS — F411 Generalized anxiety disorder: Secondary | ICD-10-CM

## 2011-07-20 DIAGNOSIS — Z79899 Other long term (current) drug therapy: Secondary | ICD-10-CM

## 2011-07-20 DIAGNOSIS — R45851 Suicidal ideations: Secondary | ICD-10-CM

## 2011-07-20 DIAGNOSIS — F329 Major depressive disorder, single episode, unspecified: Secondary | ICD-10-CM

## 2011-07-20 DIAGNOSIS — Z96659 Presence of unspecified artificial knee joint: Secondary | ICD-10-CM

## 2011-07-20 DIAGNOSIS — M25569 Pain in unspecified knee: Secondary | ICD-10-CM

## 2011-07-20 DIAGNOSIS — G8929 Other chronic pain: Secondary | ICD-10-CM

## 2011-07-20 NOTE — Telephone Encounter (Signed)
I have faxed the referral form to [facility], when I called there they felt like they would be able to schedule the appt before 08/20/11. I was able to make notes on the old referral so I'm not sure if you need to set up a new referral or not.

## 2011-07-21 DIAGNOSIS — F411 Generalized anxiety disorder: Secondary | ICD-10-CM

## 2011-07-21 DIAGNOSIS — F339 Major depressive disorder, recurrent, unspecified: Secondary | ICD-10-CM

## 2011-07-21 LAB — COMPREHENSIVE METABOLIC PANEL
ALT: 41 U/L (ref 0–53)
AST: 38 U/L — ABNORMAL HIGH (ref 0–37)
Albumin: 4 g/dL (ref 3.5–5.2)
Alkaline Phosphatase: 90 U/L (ref 39–117)
BUN: 7 mg/dL (ref 6–23)
CO2: 26 mEq/L (ref 19–32)
Calcium: 9.8 mg/dL (ref 8.4–10.5)
Chloride: 102 mEq/L (ref 96–112)
Creatinine, Ser: 0.87 mg/dL (ref 0.50–1.35)
GFR calc Af Amer: >90 mL/min (ref >90)
GFR calc non Af Amer: 87 mL/min — ABNORMAL LOW (ref >90)
Glucose, Bld: 111 mg/dL — ABNORMAL HIGH (ref 70–99)
Potassium: 3.9 mEq/L (ref 3.5–5.1)
Sodium: 139 mEq/L (ref 135–145)
Total Bilirubin: 0.5 mg/dL (ref 0.3–1.2)
Total Protein: 7.3 g/dL (ref 6.0–8.3)

## 2011-07-21 LAB — CBC
HCT: 45.2 % (ref 39.0–52.0)
Hemoglobin: 15.9 g/dL (ref 13.0–17.0)
MCH: 29.3 pg (ref 26.0–34.0)
MCHC: 35.2 g/dL (ref 30.0–36.0)
MCV: 83.4 fL (ref 78.0–100.0)
Platelets: 267 10*3/uL (ref 150–400)
RBC: 5.42 MIL/uL (ref 4.22–5.81)
RDW: 13.9 % (ref 11.5–15.5)
WBC: 6 10*3/uL (ref 4.0–10.5)

## 2011-07-21 LAB — DIFFERENTIAL
Basophils Absolute: 0.1 10*3/uL (ref 0.0–0.1)
Basophils Relative: 1 % (ref 0–1)
Eosinophils Absolute: 0.1 10*3/uL (ref 0.0–0.7)
Eosinophils Relative: 1 % (ref 0–5)
Lymphocytes Relative: 41 % (ref 12–46)
Lymphs Abs: 2.5 10*3/uL (ref 0.7–4.0)
Monocytes Absolute: 0.5 10*3/uL (ref 0.1–1.0)
Monocytes Relative: 9 % (ref 3–12)
Neutro Abs: 2.8 10*3/uL (ref 1.7–7.7)
Neutrophils Relative %: 48 % (ref 43–77)

## 2011-07-21 LAB — DRUGS OF ABUSE SCREEN W/O ALC, ROUTINE URINE
Amphetamine Screen, Ur: NEGATIVE
Barbiturate Quant, Ur: NEGATIVE
Benzodiazepines.: NEGATIVE
Cocaine Metabolites: NEGATIVE
Creatinine,U: 73.9 mg/dL
Marijuana Metabolite: NEGATIVE
Methadone: NEGATIVE
Opiate Screen, Urine: NEGATIVE
Phencyclidine (PCP): NEGATIVE
Propoxyphene: NEGATIVE

## 2011-07-22 LAB — T3, FREE: T3, Free: 3 pg/mL (ref 2.3–4.2)

## 2011-07-22 LAB — TESTOSTERONE: Testosterone: 298.99 ng/dL (ref 250–890)

## 2011-07-23 MED ORDER — ACETAMINOPHEN 325 MG PO TABS: 650.0000 mg | ORAL_TABLET | Freq: Four times a day (QID) | ORAL | Status: DC | PRN

## 2011-07-23 MED ORDER — HYDROCODONE-ACETAMINOPHEN 5-325 MG PO TABS: 1.0000 | ORAL_TABLET | Freq: Four times a day (QID) | ORAL | Status: DC | PRN

## 2011-07-23 MED ORDER — SALINE SPRAY 0.65 % NA SOLN
2.0000 | Freq: Two times a day (BID) | NASAL | Status: DC | PRN
  Administered 2011-07-24 – 2011-07-26 (×3): 2 via NASAL
  Filled 2011-07-23: qty 44

## 2011-07-23 MED ORDER — HYDROXYZINE HCL 25 MG PO TABS
25.0000 mg | ORAL_TABLET | Freq: Three times a day (TID) | ORAL | Status: DC | PRN
  Filled 2011-07-23: qty 1

## 2011-07-23 MED ORDER — GABAPENTIN 300 MG PO CAPS
300.0000 mg | ORAL_CAPSULE | ORAL | Status: DC
  Administered 2011-07-24 (×2): 300 mg via ORAL
  Filled 2011-07-23 (×3): qty 1

## 2011-07-23 MED ORDER — VENLAFAXINE HCL ER 150 MG PO CP24
150.0000 mg | ORAL_CAPSULE | Freq: Every day | ORAL | Status: DC
  Administered 2011-07-24: 150 mg via ORAL
  Filled 2011-07-23: qty 1

## 2011-07-23 MED ORDER — ZIPRASIDONE HCL 80 MG PO CAPS
80.0000 mg | ORAL_CAPSULE | Freq: Every day | ORAL | Status: DC
  Administered 2011-07-24 – 2011-07-28 (×5): 80 mg via ORAL
  Filled 2011-07-23 (×7): qty 1

## 2011-07-23 MED ORDER — TRAZODONE HCL 100 MG PO TABS
100.0000 mg | ORAL_TABLET | Freq: Every day | ORAL | Status: DC
  Administered 2011-07-24 – 2011-07-28 (×5): 100 mg via ORAL
  Filled 2011-07-23: qty 14
  Filled 2011-07-23 (×7): qty 1

## 2011-07-23 MED ORDER — VENLAFAXINE HCL ER 75 MG PO CP24
75.0000 mg | ORAL_CAPSULE | Freq: Every day | ORAL | Status: DC
  Administered 2011-07-24: 75 mg via ORAL
  Filled 2011-07-23: qty 1

## 2011-07-23 MED ORDER — CLONAZEPAM 1 MG PO TABS
0.5000 mg | ORAL_TABLET | ORAL | Status: DC
  Administered 2011-07-24 – 2011-07-29 (×12): 0.5 mg via ORAL
  Filled 2011-07-23 (×7): qty 1

## 2011-07-23 MED ORDER — NABUMETONE 500 MG PO TABS
500.0000 mg | ORAL_TABLET | ORAL | Status: DC
  Administered 2011-07-24 – 2011-07-29 (×11): 500 mg via ORAL
  Filled 2011-07-23 (×4): qty 1
  Filled 2011-07-23: qty 28
  Filled 2011-07-23 (×5): qty 1
  Filled 2011-07-23: qty 28
  Filled 2011-07-23 (×3): qty 1

## 2011-07-23 MED ORDER — CLONAZEPAM 1 MG PO TABS
1.0000 mg | ORAL_TABLET | Freq: Every day | ORAL | Status: DC
  Administered 2011-07-24 – 2011-07-28 (×5): 1 mg via ORAL
  Filled 2011-07-23 (×4): qty 1

## 2011-07-23 MED ORDER — ALUM & MAG HYDROXIDE-SIMETH 200-200-20 MG/5ML PO SUSP
30.0000 mL | ORAL | Status: DC | PRN
  Administered 2011-07-24 – 2011-07-29 (×7): 30 mL via ORAL

## 2011-07-23 MED ORDER — MAGNESIUM HYDROXIDE 400 MG/5ML PO SUSP: 30.0000 mL | Freq: Every day | ORAL | Status: DC | PRN

## 2011-07-24 MED ORDER — HYDROXYZINE HCL 25 MG PO TABS: 25.0000 mg | ORAL_TABLET | ORAL | Status: DC | PRN

## 2011-07-24 MED ORDER — VENLAFAXINE HCL ER 150 MG PO CP24
150.0000 mg | ORAL_CAPSULE | ORAL | Status: DC
  Administered 2011-07-24 – 2011-07-29 (×11): 150 mg via ORAL
  Filled 2011-07-24 (×3): qty 1
  Filled 2011-07-24: qty 14
  Filled 2011-07-24 (×6): qty 1
  Filled 2011-07-24: qty 14
  Filled 2011-07-24 (×2): qty 1

## 2011-07-24 MED ORDER — GABAPENTIN 400 MG PO CAPS
400.0000 mg | ORAL_CAPSULE | ORAL | Status: DC
  Administered 2011-07-24 – 2011-07-26 (×7): 400 mg via ORAL
  Filled 2011-07-24 (×11): qty 1

## 2011-07-24 NOTE — Progress Notes (Signed)
  [facility] MD Progress Note  07/24/2011 2:44 PM  Diagnosis: Axis I:  Major Depressive Disorder - Recurrent                                  Generalized Anxiety Disorder   The patient was seen today and reports the following:  ADL's: Intact  Sleep: Good Sleep.Marland Kitchen  Appetite: Yes, AEB: Good Appetite.   Mild>(1-10) >Severe   Hopelessness (1-10): 0  Depression (1-10): 4  Anxiety (1-10): 1  Suicidal Ideation: None - Adamantly denies any current suicidal ideations. Plan: No  Intent: No  Means: No  Homicidal Ideation:   Adamantly denies any Homicidal Ideations. Plan: No  Intent: No  Means: No Mental Status: AO x 3  General Appearance /Behavior: Neat and Casual  Eye Contact: Good  Motor Behavior: Normal  Speech: Normal  Level of Consciousness: Alert  Mood: Depressed - Mild to Moderate  Affect: Mildly Constricted  Anxiety Level: Mild  Thought Process: Coherent  Thought Content: WNL  Perception: Normal  Judgment: Good  Insight: Good  Cognition: Orientation time, place and person  Sleep: Good  Tinnitus - Mild Vital Signs:Blood pressure 120/72, pulse 83, temperature 97.5 F (36.4 C), resp. rate 16, height 5\' 8"  (1.727 m), weight 113.85 kg (250 lb 15.9 oz).  Lab Results: No results found for this or any previous visit (from the past 48 hour(s)).  Treatment Plan Summary:  Daily contact with patient to assess and evaluate symptoms and progress in treatment  Medication management  The patient will deny suicidal ideations for 48 hours prior to discharge and have a depression and anxiety rating of 3 or less.   Plan:  1. Will increase Effexor XR 150 mgs to 1 capsule po q 8 am and 2 pm to further address his depressive symptoms. 2. Will increase the medication Neuronin to 400 mgs po q 8 am, 2 pm and 10 pm to further address the patient's tinnitus. 3. Continue to monitor.  07/24/2011, 2:44 PM

## 2011-07-24 NOTE — Progress Notes (Signed)
[facility] Group Notes:  (Counselor/Nursing/MHT/Case Management/Adjunct)  07/24/2011 1315pm  Type of Therapy:  Psychoeducational Skills  Participation Level:  Did Not Attend    Neila Gear 07/24/2011, 3:19 PM

## 2011-07-24 NOTE — Progress Notes (Signed)
  Notes:patient is depressed this morning, but still having a pretty good day, went to Dr for breakfast, taking meds as ordered by MD, attending group and participating, denies SI or Hi. q79min safety checks continue and support offered Safety maintained.     [name] 07/24/2011 11:20 AM

## 2011-07-25 MED ORDER — ACAMPROSATE CALCIUM 333 MG PO TBEC
333.0000 mg | DELAYED_RELEASE_TABLET | Freq: Three times a day (TID) | ORAL | Status: DC
  Administered 2011-07-25 – 2011-07-26 (×3): 333 mg via ORAL
  Filled 2011-07-25 (×7): qty 1

## 2011-07-25 NOTE — Progress Notes (Signed)
[facility] Group Notes:  (Counselor/Nursing/MHT/Case Management/Adjunct)  07/25/2011 1:03 PM  Type of Therapy:  Group Therapy  Participation Level:  Active  Participation Quality:  Inattentive  Affect:  Labile  Cognitive:  Appropriate  Insight:  Limited  Engagement in Group:  Good  Engagement in Therapy:  Limited  Modes of Intervention:  Support and Exploration  Summary of Progress/Problems: Corian participated in activity of choosing a picture to represent wellness to him, and identified that he is still in a fog but believes that being in the hospital is like someone handing him a flashlight. After this statement, he became less attentive carrying a side conversation with another group member, laughing softly at times and finally leaving the group all together.    Billie Lade 07/25/2011, 1:03 PM

## 2011-07-25 NOTE — Assessment & Plan Note (Signed)
NAMEWARREN, LINDAHL NO.:  [id number]  MEDICAL RECORD NO.:  [id number]  LOCATION:  0505                          FACILITY:  [facility]  PHYSICIAN:  Franchot Gallo, MD     DATE OF BIRTH:  1941/07/21  DATE OF ADMISSION:  07/20/2011 DATE OF DISCHARGE:                      PSYCHIATRIC ADMISSION ASSESSMENT   This is a 70 year old male voluntarily admitted on July 20, 2011.  REASON FOR ADMISSION:  The patient reports that his therapist and his wife thought he was depressed and that he should be admitted.  He does report that he "does not enjoy life" and has had trouble focusing.  He states that he does have a plan to kill himself at some point in time when, he states, "It's enough."  He talks about that he is unable to do his normal activities of daily living, hurting, having problems with ringing in the ears.  He was admitted to the unit yesterday and attempted to harm himself with his watch as he got very upset with one of the employees.  He denies any substance use.  He denies any homicidal thoughts.  He denies any psychotic symptoms.  PAST PSYCHIATRIC HISTORY:  First admission to [facility]. Did have a suicide attempt approximately 7 years ago.  Has been seen by [name] in the [facility] outpatient department.  SOCIAL HISTORY:  Patient is married.  Has been married for 45 years.  He lives with his wife.  FAMILY HISTORY:  None.  ALCOHOL AND DRUG HISTORY:  None.  PRIMARY CARE PROVIDER:  Dr. Marvel Plan.  MEDICAL PROBLEMS: 1. He has had knee replacements. 2. Currently, he has a pinched nerve in his neck. 3. Multiple ankle surgeries.  MEDICATIONS:  He lists: 1. Trazodone 100 mg q.h.s. 2. Klonopin 1 mg b.i.d. 3. Gabapentin 300 mg 1 b.i.d. 4. Effexor b.i.d. 5. Geodon 80 mg at bedtime.  DRUG ALLERGIES:  PENICILLIN.  PHYSICAL EXAM:  This is a well-nourished, well-developed male.  He is in no acute distress.  He is complaining of anxiety as he states  he has not received his medications as ordered.  MENTAL STATUS EXAM:  The patient was seen in the interdisciplinary treatment team.  Very angry.  He was on a one-to-one as he tried to hurt himself yesterday trying to kill self with watch band. Very angry, again at an employee.  Initially came in stating that he was "not worth a damn" when asked how he was doing.  He talks about his quality of life and how, at some point in time, he would end his life due to that.  He knows that his family would be upset, but they would eventually get over it.  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  He is dressed in his own clothing at this time.  Speech is articulate.  Axis I:  Major depressive disorder, recurrent; generalized anxiety disorder. Axis II:  Deferred. Axis III:  History of knee pain, pinched nerve. Axis IV:  Medical problems, possible other psychosocial problems. Axis V:  40.  PLAN:  After speaking with the patient, discontinue the one-to-one. Patient promises safety.  We also had the nursing supervisor  to speak with the patient to address the patient's issues.  We will order further labs.  We will put patient on long-acting Effexor.  We will reorder his Klonopin and have his trazodone for sleep, have hydrocodone available for pain and Relafen for his arthritic pain.  We will also contact his support group for safety issues.  His tentative length of stay at this time is 3-4 days.     Landry Corporal, N.P.   ______________________________ Franchot Gallo, MD    JO/MEDQ  D:  07/21/2011  T:  07/21/2011  Job:  [id number]  Electronically Signed by Limmie PatriciaP. on 07/25/2011 03:54:18 PM Electronically Signed by Franchot Gallo MD on 07/25/2011 05:45:43 PM

## 2011-07-25 NOTE — Progress Notes (Signed)
Pt denies hallucinations at this times. Pt said that he has had hallucinations in the past of floor moving and medication is helping. He does voice concerns about paying for medications at discharge and reports that he is following up with CM. Offered support and encouragement. Pt denies si and safety maintained on unit.

## 2011-07-25 NOTE — Progress Notes (Signed)
[facility] MD Progress Note  07/25/2011 4:24 PM  Diagnosis: Axis I:  Major Depressive Disorder - Recurrent                Generalized Anxiety Disorder   The patient was seen today and reports the following:   ADL's: Intact  Sleep: Good Sleep.  Appetite: Yes, AEB: Good Appetite.  Mild>(1-10) >Severe   Hopelessness (1-10): 0  Depression (1-10): 2-3  Anxiety (1-10): 0   Suicidal Ideation: None - Adamantly denies any current suicidal ideations.  Plan: No  Intent: No  Means: No  Homicidal Ideation: Adamantly denies any Homicidal Ideations.  Plan: No  Intent: No  Means: No Mental Status: AO x 3  General Appearance /Behavior: Neat and Casual  Eye Contact: Good  Motor Behavior: Normal  Speech: Normal  Level of Consciousness: Alert  Mood: Depressed - Mild  Affect: Mildly Constricted  Anxiety Level: Resolved  Thought Process: Coherent  Thought Content: WNL  Perception: Normal  Judgment: Good  Insight: Good  Cognition: Orientation time, place and person  Sleep: Good  Tinnitus - Moderate   Vital Signs:Blood pressure 125/80, pulse 77, temperature 97.4 F (36.3 C), temperature source Oral, resp. rate 20, height 5\' 8"  (1.727 m), weight 113.85 kg (250 lb 15.9 oz).  Lab Results: No results found for this or any previous visit (from the past 48 hour(s)). hour(s)).   Treatment Plan Summary:  1.  Daily contact with patient to assess and evaluate symptoms and progress in treatment  2.  Medication management  3.  The patient will deny suicidal ideations for 48 hours prior to discharge and have a depression and anxiety rating of 3 or less.   Plan:  1. Will continue current medications. 2. Will start Campral at 333 mgs po q 8 hours for tinnitus. 3. Continue to monitor.   Elberta Lachapelle 07/25/2011, 4:24 PM

## 2011-07-25 NOTE — Progress Notes (Signed)
  Pt. was compliant with scheduled hs medications, pt voiced no complaints or concerns,  Safety maintained on unit, will continue to monitor.

## 2011-07-25 NOTE — Progress Notes (Addendum)
Pt has been here since 07/20/11 and reports his depression at a 3 today and SI at a zero.  Pt reports that the meds they are giving him here are not helping with his sleep and he stays awake staring at the wall. Pt reported being maniac this weekend in terms of being very talkative and that not being usual for him. Pt. Currently seeing [name] and expressed a desire to change psychiatrists and wanting to see PA Lloyd Huger. Pt currently sees Maxcine Ham for out pt and will start the IOP program upon d/c. Potential d/c date tomorrow.  Pt was given information on suicide risk, warning signs and contact information as well as a safety planning. Jeannette How 07/25/2011 12:45 PM

## 2011-07-26 LAB — VITAMIN D 1,25 DIHYDROXY: Vitamin D 1, 25 (OH)2 Total: 87 pg/mL — ABNORMAL HIGH (ref 18–72)

## 2011-07-26 MED ORDER — ACAMPROSATE CALCIUM 333 MG PO TBEC
666.0000 mg | DELAYED_RELEASE_TABLET | Freq: Three times a day (TID) | ORAL | Status: DC
  Administered 2011-07-26 – 2011-07-28 (×5): 666 mg via ORAL
  Filled 2011-07-26 (×6): qty 2

## 2011-07-26 NOTE — Progress Notes (Signed)
Met with pt individually on this date.  Pt refused to come to d/c planning group today.  Pt was upset because he ran into night nurse he was not suppose to have contact with last night which prompted him to get upset again.  Pt states that a barrier to his d/c and plan to stay well is he is unable to pay for meds when he leaves the hospital.  Pt states he was paying $60 for his meds per month.  SW referred pt to [facility], explaining that they have a pharmacy that could assist him with meds.  Yesterday, pt states he doesn't want to do IOP anymore because it would cost a copay of $35 a visit and he is unable to afford this.  SW discussed pt being able to work with finance dept and set up a payment plan, to afford and attend IOP.  Pt was set up with [name] for therapy and [name], PA for med management.  SW will refer pt to IOP again.  Pt also was able to talk with [name], PA and explain why he is switching providers.

## 2011-07-26 NOTE — Progress Notes (Signed)
[facility] Group Notes:  (Counselor/Nursing/MHT/Case Management/Adjunct)  07/26/2011 4:06 PM  Type of Therapy:  Group Therapy  Participation Level:  Did Not Attend   Billie Lade 07/26/2011, 4:06 PM

## 2011-07-26 NOTE — Progress Notes (Signed)
  [facility] MD Progress Note  07/26/2011 3:27 PM  The patient was seen today and reports the following:  Diagnosis:  Axis I: Major Depressive Disorder - Recurrent  Generalized Anxiety Disorder   The patient was seen today and reports the following:   ADL's: Intact  Sleep: Good Sleep.  Appetite: Yes, AEB: Good Appetite.  Mild>(1-10) >Severe  Hopelessness (1-10): 8  Depression (1-10): 8 Anxiety (1-10): 2-3   Suicidal Ideation: None - Adamantly denies any current suicidal ideations.  Plan: No  Intent: No  Means: No  Homicidal Ideation: Adamantly denies any Homicidal Ideations.  Plan: No  Intent: No  Means: No  Mental Status: AO x 3  General Appearance /Behavior: Neat and Casual  Eye Contact: Good  Motor Behavior: Normal  Speech: Normal  Level of Consciousness: Alert  Mood: Depressed - Moderate to Severe Affect: Moderately Constricted  Anxiety Level: Mild  Thought Process: Coherent  Thought Content: WNL  Perception: Normal  Judgment: Good  Insight: Good  Cognition: Orientation time, place and person  Sleep: Good  Tinnitus - Moderate   The patient is concerned about how he is going to obtain his medications after discharge.  He states this has made him depressed and more hopeless.  Options including [facility] was offered.  Vital Signs:Blood pressure 134/82, pulse 73, temperature 97.3 F (36.3 C), temperature source Oral, resp. rate 18, height 5\' 8"  (1.727 m), weight 113.85 kg (250 lb 15.9 oz).  Lab Results: No results found for this or any previous visit (from the past 48 hour(s)).  Treatment Plan Summary:  1. Daily contact with patient to assess and evaluate symptoms and progress in treatment  2. Medication management  3. The patient will deny suicidal ideations for 48 hours prior to discharge and have a depression and anxiety rating of 3 or less.  Plan:   1. Will continue current medications.  2. Will increase Campral at 666 mgs po q 8 hours for tinnitus.  3.  Continue to monitor.   Sean Hill 07/26/2011, 3:27 PM

## 2011-07-26 NOTE — Progress Notes (Signed)
  Affect flat this morning.  Stated he did not want to attend any groups and would rather lie in his bed.  States that is what he normally does when he is depressed.  Patient rates his depression and hopelessness at an 8.  States he has had some problems since admission and did not feet they were being adequately addressed.  Patient spoke at length and I continued to encourage him to attend groups and to get out of his room and not allow this one situation to control his stay here.  He was able to process with me and agreed to try to get through the situation.  He also spent some time speaking with R. Suzie Portela at his request and he states this was a positive intervention for him.  Has been pleasant and cooperative with staff this shift.

## 2011-07-26 NOTE — Progress Notes (Signed)
Following the evening Wrap-Up group and snack, this Thereasa Parkin was approached by a male pt.regarding this gentleman's behavior. It was alleged that the pt. stared at a male peer of his and argued with her. She states that the pt.brought up information that she had revealed in an earlier group. She states that she felt uncomfortable since he confronted her repeatedly from the other side of the room and felt that he might harm her.  She also verbalized that she attempted to reason with the pt. by reassuring him that they were both dealing with similar issues and had been struggling with coping. The pt. Apparently responded back to her in an angry tone and another male peer mentioned to this Thereasa Parkin that she was willing to harm this gentleman if he had attacked her male peer. Both of the male patients reported to this Thereasa Parkin that they felt very uncomfortable around with this man and that they were also upset that he had violated the rules of the group by bringing up their responses from a previous group. Approximately 20-30 minutes prior to this incident, the patient yelled at a nurse and taunted him verbally.

## 2011-07-27 ENCOUNTER — Encounter: Payer: Self-pay | Admitting: Family Medicine

## 2011-07-27 MED ORDER — BUPROPION HCL ER (SR) 150 MG PO TB12
150.0000 mg | ORAL_TABLET | Freq: Every day | ORAL | Status: DC
  Administered 2011-07-27 – 2011-07-29 (×3): 150 mg via ORAL
  Filled 2011-07-27: qty 14
  Filled 2011-07-27 (×3): qty 1

## 2011-07-27 MED ORDER — ZIPRASIDONE HCL 40 MG PO CAPS
40.0000 mg | ORAL_CAPSULE | Freq: Every day | ORAL | Status: DC
  Administered 2011-07-27 – 2011-07-29 (×3): 40 mg via ORAL
  Filled 2011-07-27 (×2): qty 1
  Filled 2011-07-27: qty 42
  Filled 2011-07-27: qty 1

## 2011-07-27 MED ORDER — CARBAMAZEPINE 200 MG PO TABS
200.0000 mg | ORAL_TABLET | ORAL | Status: DC
  Administered 2011-07-27 – 2011-07-29 (×5): 200 mg via ORAL
  Filled 2011-07-27 (×3): qty 1
  Filled 2011-07-27: qty 28
  Filled 2011-07-27 (×2): qty 1
  Filled 2011-07-27: qty 28
  Filled 2011-07-27: qty 1

## 2011-07-27 NOTE — Progress Notes (Signed)
  Pt is awake and active on the unit this AM. Pt denies SI/HI and A/V hallucinations. Pt is attending groups and states that he will return to eating meals regularly. Pt affect is sad and mood is depressed but pt is cooperative with staff. Writer discussed the interpersonnal issue he was having with another pt, and he states that he will no longer address her in a negative manner. Pt states that he has been feeling angry due to his experience at [facility] during this admission. Pt has been ruminating over the events leading up to today and decided that he would stop eating and attending groups. Writer listened to pt, offered self and suggested that he focus on moving forward and living in the moment. Writer stated that it would be a greater loss for the pt to cheat himself out of the benefits of treatment by withdrawing than to move forward and accept the situation as it is. Pt states that he will participate in the future and eat his regular meals. Pt has been attending groups so far today. Director of adult unit notified of situation with pt conflict.  Pt expressed gratitude for staff intervention. Writer will continue to monitor.

## 2011-07-27 NOTE — Tx Team (Signed)
Interdisciplinary Treatment Plan Update (Adult)  Date:  07/28/2011  Time Reviewed:   10:55 am  Progress in Treatment: Attending groups: No. Participating in groups:  As evidenced by:  Sean Hill reports he is not attneding and prefers to stay in his room Taking medication as prescribed:  Yes. Tolerating medication:  As evidenced by:  some meds have been changes and pt report he is toelrating medications Family/Significant othe contact made:  Yes, individual(s) contacted:  wife was contacted over the weekend Patient understands diagnosis:  Yes. Discussing patient identified problems/goals with staff:  Yes. and As evidenced by:  Sean Hill has been discussing his concerns and problems he is having during treatment team and with other members of administration Medical problems stabilized or resolved:  Yes. Denies suicidal/homicidal ideation: No. and As evidenced by:  Sean Hill reported that he was not having SI "at this moment" duirng tx team on 07-27-11. Sean Hill went on to say that he didn't have anything to "do it with" and that he had some suicidal thoughts the evening before. Suicude prevention was and safety planning was discussed with Sean Hill Issues/concerns per patient self-inventory:  No. Other:  New problem(s) identified: Yes, Describe:  Sean Hill continues to regress citing his anxiety and depression are at an 8. He reports meds don't seem to be working and he is refusing to eat meals citing "it is the only thing I have control over". Sean Hill continues to get into Health and safety inspector with staff and other patients  Reason for Continuation of Hospitalization: Medication stabilization  Interventions implemented related to continuation of hospitalization:  Additional comments:  Estimated length of stay: 1 day  Discharge Plan:  [facility]   New goal(s):  Review of initial/current patient goals per problem list:   1.  Goal(s): decrease depression  Met:  Yes  Target date: by d/c date  As  evidenced by: pt reports decrease in depression from 8 to 1  2.  Goal (s): reduce anxiety  Met:  Yes  Target date:by date of d/c  As evidenced by: anxiety dropped from 8 to a 1  3.  Goal(s): reduce SI  Met:  Yes  Target date: by d/c date  As evidenced by: pt denies SI  4.  Goal(s):  Met:  No  Target date:  As evidenced by:  Attendees: Patient:     Family:     Physician:  Franchot Gallo, MD 07/28/2011 10:55  Nursing:   Neill Loft 07/28/2011 10:55  Case Manager:  Jeannette How  07/28/2011 10:55  Counselor:  Angus Palms 07/28/2011 10:55  Other:  Horace Porteous Hodnett 07/28/2011 10:55  Other:  Harl Favor 07/28/2011 10:55  Other:  Leeroy Bock Horton 07/28/2011 10:55  Other:  Cato Mulligan, RN 07/28/2011 10:55   Scribe for Treatment Team:   Jeannette How, 07/28/2011, 10:55

## 2011-07-27 NOTE — Progress Notes (Signed)
  Pt participated in group and interacts with the other patients well. Pt denies SI but has been reported by other patients that he has suggested that suicide is possible with the use of their wristbands. Pt also has some animosity towards a staff member, where he threatens to hit him if he approaches him. Pt was verbally deescalated and he went to his room shortly afterwards. I also had a 1:1 interaction with the patient, where I told him to focus on his treatment and to not be involved with the dislike of the employees presence. Pt reports that he didn't know that he was signing off his rights here, when he voluntarily admitted himself as a patient. Pt was educated on the purpose of the the units rules and policies. Pt stated that he doesn't eat breakfast and dinner as a outlet of having some control over what he can and can not do while he is here. Pt has been encouraged to eat and educated on the fact that hospitals psychiatric or not offer food at specific times. Pt has also reported that he doesn't want to come out for groups due to other patients input that he dislikes. Pt has been encouraged to go to groups and to decrease the amount of time in his room. Pt thoughts briefly diverged from his thoughts of the hospital as he talked about politics and the war. Undivided attention was given to this patient, with no disclosure of political views. Availability has been extended to this patient as needed. Pt safety remains with q84min checks.

## 2011-07-27 NOTE — Progress Notes (Signed)
[facility] Group Notes:  (Counselor/Nursing/MHT/Case Management/Adjunct)  07/27/2011 1:40 PM  Type of Therapy:  Group Therapy  Participation Level:  Minimal  Participation Quality:  Attentive and Inappropriate   Affect:  Irritable  Cognitive:  Appropriate  Insight:  Limited  Engagement in Group:  Limited  Engagement in Therapy:  Limited  Modes of Intervention:  Support and Exploration  Summary of Progress/Problems: Lanell talked about his tendency to be negative and get caught up in negative anxiety. He shared that if he were to lose a job he would be unable to focus on finding a new one because he would be so caught up in worrying about the consequences of not having a job. Levaughn seemed able to realize that the anxiety was worsening this situation, but did not seem willing/able to process through it to deal with the anxiety. He made several jokes throughout group that seemed aimed at getting the group off course.    Billie Lade 07/27/2011, 1:40 PM

## 2011-07-27 NOTE — Progress Notes (Signed)
Recreation Therapy Group Note  Date: 07/27/2011         Time: 1415      Group Topic/Focus: The focus of this group is on enhancing patients' ability to work cooperatively with others. Groups discusses barriers to cooperation and strategies for successful cooperation.   Participation Level: Did not attend  Participation Quality: Not Applicable  Affect: Not Applicable  Cognitive: Not Applicable   Additional Comments: None   Clorine Swing 07/27/2011 3:55 PM 

## 2011-07-27 NOTE — Telephone Encounter (Signed)
SW Chris at [facility], she has left several messages for patient. Advised her of recent hospital admit so she will hold referral & try calling later

## 2011-07-27 NOTE — Progress Notes (Signed)
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  Appetite: Yes, AEB: Good Appetite.   Mild>(1-10) >Severe  Hopelessness (1-10): 3  Depression (1-10): 8  Anxiety (1-10): 2-3   Suicidal Ideation: None - Adamantly denies any current suicidal ideations.  Plan: No  Intent: No  Means: No  Homicidal Ideation: Adamantly denies any Homicidal Ideations.  Plan: No  Intent: No  Means: No  Mental Status: AO x 3  General Appearance /Behavior: Neat and Casual  Eye Contact: Good  Motor Behavior: Normal  Speech: Normal  Level of Consciousness: Alert  Mood: Depressed - Moderate to Severe  Affect: Moderately Constricted  Anxiety Level: Mild  Thought Process: Coherent  Thought Content: WNL  Perception: Normal  Judgment: Good  Insight: Good  Cognition: Orientation time, place and person  Sleep: Good   Treatment Plan Summary:  1. Daily contact with patient to assess and evaluate symptoms and progress in treatment  2. Medication management  3. The patient will deny suicidal ideations for 48 hours prior to discharge and have a depression and anxiety rating of 3 or less.   Plan:  1. Will continue current medications.  2. Increase Geodon to 40 mgs po qam and 80 mgs po qhs for irritability and mood stabilization. 3. Start Tegretol 200 mgs po qam and hs for mood stabilization. 3. Start Wellbutrin SR 150 mgs po qam for antidepressant augmentation. 4. D/C Neurontin secondary to no benefit. 5. Continue to monitor.  Randy Readling 07/27/2011, 2:42 PM
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
## 2011-07-28 LAB — GLUCOSE, CAPILLARY: Glucose-Capillary: 80 mg/dL (ref 70–99)

## 2011-07-28 LAB — COMPREHENSIVE METABOLIC PANEL
ALT: 37 U/L (ref 0–53)
Alkaline Phosphatase: 85 U/L (ref 39–117)
BUN: 10 mg/dL (ref 6–23)
CO2: 24 mEq/L (ref 19–32)
GFR calc Af Amer: >90 mL/min (ref >90)
GFR calc non Af Amer: 83 mL/min — ABNORMAL LOW (ref >90)
Glucose, Bld: 102 mg/dL — ABNORMAL HIGH (ref 70–99)
Potassium: 3.8 mEq/L (ref 3.5–5.1)
Sodium: 136 mEq/L (ref 135–145)

## 2011-07-28 LAB — DIFFERENTIAL
Eosinophils Relative: 1 % (ref 0–5)
Lymphocytes Relative: 35 % (ref 12–46)
Lymphs Abs: 2 10*3/uL (ref 0.7–4.0)
Monocytes Relative: 9 % (ref 3–12)
Neutrophils Relative %: 53 % (ref 43–77)

## 2011-07-28 LAB — CBC
Hemoglobin: 15.1 g/dL (ref 13.0–17.0)
MCV: 83.6 fL (ref 78.0–100.0)
Platelets: 243 10*3/uL (ref 150–400)
RBC: 5.17 MIL/uL (ref 4.22–5.81)
WBC: 5.8 10*3/uL (ref 4.0–10.5)

## 2011-07-28 LAB — CARBAMAZEPINE LEVEL, TOTAL: Carbamazepine Lvl: 3.8 ug/mL — ABNORMAL LOW (ref 4.0–12.0)

## 2011-07-28 MED ORDER — ACAMPROSATE CALCIUM 333 MG PO TBEC
333.0000 mg | DELAYED_RELEASE_TABLET | Freq: Three times a day (TID) | ORAL | Status: DC
  Administered 2011-07-28 – 2011-07-29 (×4): 333 mg via ORAL
  Filled 2011-07-28 (×3): qty 1

## 2011-07-28 NOTE — Progress Notes (Signed)
  Pt is awake and active on the unit this AM. Pt affect is sad but mood is appropriate. Pt states that he is still doing his best to stay on track with his treatment plan. Writer offered self and encouraged pt to keep moving forward. Pt is attending meals and groups today and has continued to uphold his agreement with staff to let other pt's benefit from treatment without interrupting their plan of care. Pt denies SI/HI and A/V hallucinations. Writer will continue to monitor.

## 2011-07-28 NOTE — Progress Notes (Signed)
Inpatient Recreation Therapy Assessment  Patient Stressors: Other : Increased Depression, Medication Stabilization (tired all day), Stress of Admission  Coping Skills: Isolates (will leave stressful situations), Argues (verbally aggressive with others when he doesn't get his way), Talking (sharing feelings with others, occassionally), Suicide Attempt (last attempt prior to admission- 7 years ago), Self-Injury (cutting) and Other (restricting food)  Leisure Interests (2+): Role-play games (patient reports leisure interests were limited prior to discharge because of drowsiness related to medication)  Support System: Spouse/Partner Wife and Other 2 daughters  Self Esteem: 8  Personal Strengths: Psychologist, 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 Patient worries his medications don't allow him to be alert enough to drive.  Barriers to Leisure Upon Admission: Social and Physical  Awareness of Community Resources: Yes and City/County [place]  Patient's Goal for Hospitalization: Medication Stablization  Treatment Plan: Patient fully participated in the Treatment Plan  Goals for Discharge: Leisure Education Counseling  Comments/Additional Information: LRT met with patient at the request of the Publishing copy. Patient pleasant and cooperative, initially blaming other staff for his lengthy admission, but began to accept some responsibility as the discussion continued. Patient reports his leisure pursuits have been limited because his medications prior to admission made him too drowsy and he was uncomfortable being around other people. Patient feels these issues have been resolved during his admission. Patient reports he "has the tools," but hasn't been using them. Patient reports that at discharge he plans on walking, spending more time with his wife, and is  considering joining the [facility]. Patient also reports that he will be enrolling in an anger management class through Mental Health to address that concern. Recreation Therapist feels further individual treatment is not required, as the patient is scheduled for discharge tomorrow and has identified a comprehensive plan for discharge. Patient encouraged to recreation therapy group tomorrow if he hasn't been discharged yet.  Patient agreed to allow recreation therapy intern to participate in treatment: N/A - Not applicable    Sean Hill 07/28/2011 1:00 PM

## 2011-07-28 NOTE — Plan of Care (Signed)
Problem: Diagnosis: Increased Risk For Suicide Attempt Goal: STG-Patient Will Participate in Any Family Sessions STG: Patient will participate in any family sessions arranged by counselor.  Outcome: Adequate for Discharge RT met individually with patient (see Recreation Therapy Assessment) and patient is able to identify positive leisure activities he will engage in upon discharge to cope with stress/depression.
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
## 2011-07-28 NOTE — Plan of Care (Signed)
Problem: Alteration in mood Goal: STG-Patient is able to discuss feelings and issues (Patient is able to discuss feelings and issues leading to depression)  Outcome: Progressing Patient has not been attending all recreation therapy groups, RT met with patient today, patient encouraged to attend group tomorrow if he hasn't been discharged yet.

## 2011-07-28 NOTE — Progress Notes (Signed)
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  Appetite: Yes, AEB: Good Appetite.   Mild>(1-10) >Severe  Hopelessness (1-10): 0  Depression (1-10): 1  Anxiety (1-10): 1   Suicidal Ideation:  None - Adamantly denies any suicidal ideations.  Plan: No  Intent: No  Means: No  Homicidal Ideation: Adamantly denies any Homicidal Ideations.  Plan: No  Intent: No  Means: No  Mental Status: AO x 3  General Appearance /Behavior: Neat and Casual  Eye Contact: Good  Motor Behavior: Normal  Speech: Normal  Level of Consciousness: Alert  Mood: Depressed - Mildly Depressed.  Affect: Mildly Constricted  Anxiety Level: Mild  Thought Process: Coherent  Thought Content: WNL  Perception: Normal  Judgment: Good  Insight: Good  Cognition: Orientation time, place and person  Sleep:  Number of Hours: 6.25   Tinnitus:  Unchanged.  Vital Signs:Blood pressure 130/79, pulse 78, temperature 98.6 F (37 C), temperature source Oral, resp. rate 16, height 5\' 8"  (1.727 m), weight 113.85 kg (250 lb 15.9 oz).  Lab Results: No results found for this or any previous visit (from the past 48 hour(s)).  Treatment Plan Summary:  1. Daily contact with patient to assess and evaluate symptoms and progress in treatment  2. Medication management  3. The patient will deny suicidal ideations for 48 hours prior to discharge and have a depression and anxiety rating of 3 or less.   Plan:  1. Will continue current medications.  2. Will decrease Campral to 333 mgs po q 8 hours with plans to discontinue it since the patient has received no benefit regarding his tinnitus. 3. CBC with Diff, CMP and Serum Tegretol level tonight. 4. Possible discharge tomorrow.  Mirza Kidney 07/28/2011, 2:42 PM

## 2011-07-28 NOTE — Progress Notes (Addendum)
  Pt. Reports that he feels "fantastic" this evening. Reports that he is moving forward and feels better since he has gotten his "medications situated". Reports that he will  Attend Karaoke.    Encouragement and support given.  Pt. receptive

## 2011-07-28 NOTE — Progress Notes (Signed)
Pt. complians of pain associated with indigestion, writer administered Maalox. Pt. Denies SHI. Pt. Reports day been up and down "hadn't been eating and today is the first time I ate three meals". Writer asked patient if he had a medical problems that was keeping him from eating. He responds "no I thought that was the only control I had". Writer reminded patient that he was only doing something that would harm himself. Pt responded "I know, so I started eating" Staff will continue to monitor q12min for safety.
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
## 2011-07-29 MED ORDER — NABUMETONE 500 MG PO TABS: 500.0000 mg | ORAL_TABLET | ORAL | Status: DC

## 2011-07-29 MED ORDER — RANITIDINE HCL 150 MG/10ML PO SYRP: 150.0000 mg | ORAL_SOLUTION | Freq: Once | ORAL | Status: DC

## 2011-07-29 MED ORDER — BUPROPION HCL ER (SR) 150 MG PO TB12: 150.0000 mg | ORAL_TABLET | Freq: Every day | ORAL | Status: DC

## 2011-07-29 MED ORDER — PANTOPRAZOLE SODIUM 40 MG PO TBEC: 40.0000 mg | DELAYED_RELEASE_TABLET | Freq: Every day | ORAL | Status: DC

## 2011-07-29 MED ORDER — PANTOPRAZOLE SODIUM 40 MG PO TBEC
40.0000 mg | DELAYED_RELEASE_TABLET | Freq: Every day | ORAL | Status: DC
  Administered 2011-07-29: 40 mg via ORAL
  Filled 2011-07-29 (×2): qty 1
  Filled 2011-07-29: qty 14

## 2011-07-29 MED ORDER — FAMOTIDINE 20 MG PO TABS
20.0000 mg | ORAL_TABLET | Freq: Once | ORAL | Status: AC
  Administered 2011-07-29: 20 mg via ORAL
  Filled 2011-07-29 (×2): qty 1

## 2011-07-29 MED ORDER — ZIPRASIDONE HCL 40 MG PO CAPS: ORAL_CAPSULE | ORAL | Status: DC

## 2011-07-29 MED ORDER — CLONAZEPAM 0.5 MG PO TABS: 0.5000 mg | ORAL_TABLET | ORAL | Status: DC

## 2011-07-29 MED ORDER — VENLAFAXINE HCL ER 150 MG PO CP24: 150.0000 mg | ORAL_CAPSULE | ORAL | Status: DC

## 2011-07-29 MED ORDER — CARBAMAZEPINE 200 MG PO TABS: 200.0000 mg | ORAL_TABLET | ORAL | Status: DC

## 2011-07-29 NOTE — Progress Notes (Signed)
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  Appetite: Yes, AEB: Good Appetite.   Mild>(1-10) >Severe  Hopelessness (1-10): 0  Depression (1-10): 0 to 1  Anxiety (1-10): 0   Suicidal Ideation: None - Adamantly denies any suicidal ideations.  Plan: No  Intent: No  Means: No  Homicidal Ideation: Adamantly denies any Homicidal Ideations.  Plan: No  Intent: No  Means: No   Mental Status: AO x 3  General Appearance /Behavior: Neat and Casual  Eye Contact: Good  Motor Behavior: Normal  Speech: Normal  Level of Consciousness: Alert  Mood: Depressed - Mildly Depressed.  Affect: Mildly Constricted  Anxiety Level: Resolved. Thought Process: Coherent  Thought Content: WNL  Perception: Normal  Judgment: Good  Insight: Good  Cognition: Orientation time, place and person  Tinnitus: Unchanged.   The patient was very appreciative of the care he received while at behavioral health.  He states "you have saved my life."  He plans to begin [facility] IOP early next week.  Treatment Plan Summary:  1. Daily contact with patient to assess and evaluate symptoms and progress in treatment  2. Medication management  3. The patient will deny suicidal ideations for 48 hours prior to discharge and have a depression and anxiety rating of 3 or less.   Plan:  1. Will continue current medications.  2. Will discontinue the medication Campral since the patient has received no benefit regarding his tinnitus.  3. The patient will be discharged today as requested and will begin [facility] IOP early next week.  SUICIDE RISK:   Minimal: No identifiable suicidal ideation.  Patients presenting with no risk factors but with morbid ruminations; may be  classified as minimal risk based on the severity of the depressive symptoms   Sharhonda Atwood 07/29/2011, 12:58 PM

## 2011-07-29 NOTE — Progress Notes (Signed)
  Pt. Was d/c to care of  wife. All goals met, medications reviewed.  Samples given and prescriptions provided.  Pt. Denies SI/HI and denies pain.  Follow up plans and paper work reviewed.  All consents signed. Pt. Was appreciative of care given and terminated relationships with staff appropriately.

## 2011-07-29 NOTE — Progress Notes (Signed)
Pt. Eyes closed, resp. Even no distress noted. Staff will continue to monitor q15min for safety. 

## 2011-07-29 NOTE — Tx Team (Signed)
Interdisciplinary Treatment Plan Update (Adult)  Date:  07/29/2011 Time Reviewed:  10:00 am  Progress in Treatment: Attending groups: Yes. Participating in groups:  Yes. Taking medication as prescribed:  Yes. Tolerating medication:  Yes. Family/Significant othe contact made:  Yes, individual(s) contacted:  contact made with pt's wife Patient understands diagnosis:  Yes. Discussing patient identified problems/goals with staff:  Yes. Medical problems stabilized or resolved:  Yes. Denies suicidal/homicidal ideation: Yes. Pt denies SI/HI/.  Issues/concerns per patient self-inventory:  No. Other:  New problem(s) identified: No, Describe:  none identified  Reason for Continuation of Hospitalization: Pt stable to d/c today  Interventions implemented related to continuation of hospitalization: NA  Additional comments:   Estimated length of stay: D/C today  Discharge Plan: Pt will f/u with [facility] on Tuesday and [facility] for meds  New goal(s):  Review of initial/current patient goals per problem list:   1.  Goal(s): Reduce Depression  Met:  Yes  Target date: by d/c  As evidenced by: Pt ranking a depression from a 10 to a 0 today.  2.  Goal (s): Eliminate Suicidal Ideation  Met:  Yes  Target date: by d/c  As evidenced by: Pt denies SI.   3.  Goal(s): Medication Stabilization  Met:  Yes  Target date: by d/c  As evidenced by: Pt stable on medications as evidence by improved mood.   4.  Goal(s):  Met:  No  Target date:  As evidenced by:  Attendees: Patient:     Family:     Physician:  Franchot Gallo, MD 07/29/2011 10:10am  Nursing:    07/29/2011 10:10am  Case Manager:  Jeannette How, LCSW 07/29/2011 10:10am  Counselor:  Angus Palms, LCSW 07/29/2011 10:10am  Other:  Juline Patch, LCSW 07/29/2011 10:10am  Other:     Other:     Other:      Scribe for Treatment Team:   Carmina Miller
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
## 2011-07-29 NOTE — Progress Notes (Signed)
[facility] Group Notes:  (Counselor/Nursing/MHT/Case Management/Adjunct)  07/29/2011 2:41 PM  Type of Therapy:  Group Therapy  Participation Level:  Active  Participation Quality:  Attentive and sharing  Affect:  Appropriate  Cognitive:  Alert and Appropriate  Insight:  Limited  Engagement in Group:  Good  Engagement in Therapy:  Good  Modes of Intervention:  Support and Exploration  Summary of Progress/Problems: Maximino identified being around others engaging in the activity he is trying to avoid as a risk factor for relapse. He talked about not wanting to relapse into depression or mania, and identified that he is trying to trust his medication and adjust to his "new normal." Zachariah stated that his wife is his protective factor.   Billie Lade 07/29/2011, 2:41 PM

## 2011-08-01 NOTE — Progress Notes (Signed)
Patient Discharge Instructions:  Dictated admission note faxed, Date faxed:  08/01/2011 D/C instructions faxed, Date faxed:  08/01/2011 Med. Rec. Form faxed, Date faxed:  08/01/2011 Sent to [place] [facility]  Wandra Scot, 08/01/2011, 12:54 PM

## 2011-08-02 ENCOUNTER — Ambulatory Visit (HOSPITAL_COMMUNITY): Payer: Medicare PPO | Admitting: Physician Assistant

## 2011-08-02 LAB — CARBAMAZEPINE, FREE AND TOTAL: Carbamazepine Metabolite/: 0.2 ug/mL

## 2011-08-04 ENCOUNTER — Telehealth (HOSPITAL_COMMUNITY): Payer: Self-pay | Admitting: *Deleted

## 2011-08-04 NOTE — Telephone Encounter (Signed)
See message under phone encounter

## 2011-08-05 ENCOUNTER — Ambulatory Visit (HOSPITAL_COMMUNITY): Payer: Medicare PPO | Admitting: Psychology

## 2011-08-10 ENCOUNTER — Telehealth (HOSPITAL_COMMUNITY): Payer: Self-pay | Admitting: Physician Assistant

## 2011-08-10 NOTE — Telephone Encounter (Signed)
The patient states that his depression is getting worse since he was discharged from the hospital.  He states he is not suicidal, but is feeling remorseful due to being in the hospital. Is concerned about how his family is responding to his hospitalization.  He did not complete his post hospitalization follow up to start IOP and did not call to reschedule.  I will have [name] to call him and get him rescheduled to start IOP.  He was fine with this.

## 2011-08-13 ENCOUNTER — Ambulatory Visit (HOSPITAL_COMMUNITY)
Admission: RE | Admit: 2011-08-13 | Discharge: 2011-08-13 | Disposition: A | Discharge status: Home / Self Care | Payer: Medicare PPO | Source: Ambulatory Visit | Attending: Psychiatry | Admitting: Psychiatry

## 2011-08-13 DIAGNOSIS — F3132 Bipolar disorder, current episode depressed, moderate: Secondary | ICD-10-CM | POA: Insufficient documentation
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
## 2011-08-16 ENCOUNTER — Other Ambulatory Visit (HOSPITAL_COMMUNITY): Payer: Medicare PPO

## 2011-08-17 ENCOUNTER — Other Ambulatory Visit (HOSPITAL_COMMUNITY): Payer: Medicare PPO | Attending: Psychiatry

## 2011-08-17 ENCOUNTER — Encounter (HOSPITAL_COMMUNITY): Payer: Self-pay

## 2011-08-17 DIAGNOSIS — F419 Anxiety disorder, unspecified: Secondary | ICD-10-CM

## 2011-08-17 DIAGNOSIS — F39 Unspecified mood [affective] disorder: Secondary | ICD-10-CM

## 2011-08-17 DIAGNOSIS — F331 Major depressive disorder, recurrent, moderate: Secondary | ICD-10-CM | POA: Insufficient documentation

## 2011-08-17 DIAGNOSIS — F411 Generalized anxiety disorder: Secondary | ICD-10-CM | POA: Insufficient documentation

## 2011-08-17 NOTE — Progress Notes (Signed)
    Daily Group Progress Note  Program: IOP  Group Time: 9:00 - 10:30 a.m.  Participation Level: None  Behavioral Response: Appropriate  Type of Therapy:  Process Group  Summary of Progress: Patient did not say a word during the group even though the group made attempts to get him involved.  This was his first IOP group.  Group Time: 11:00 - Noon  Participation Level:  None  Behavioral Response: Appropriate  Type of Therapy: Psycho-education Group  Summary of Progress: Patient attended group on cognitive behavior therapy but did not interact.    Cleophas Dunker, LMFT, CTS
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
## 2011-08-18 ENCOUNTER — Other Ambulatory Visit (HOSPITAL_COMMUNITY): Payer: Medicare PPO

## 2011-08-18 NOTE — Progress Notes (Signed)
    Daily Group Progress Note  Program: IOP  Group Time: 9:00 - 10:30 a.m.  Participation Level: Minimal  Behavioral Response: Appropriate  Type of Therapy:  Process Group  Summary of Progress: Patient told the group that he had bipolar disorder and five suicide attempts including on a few weeks ago in our inpatient department.  Patient states he wants to die.  He reports his body is giving out due to all of the "thrills he experienced" throughout his life including skydiving, bungy jumping, etc.  Patient thinks he now has nothing to live for.  However, he did state with this last suicide attempt he was able to recognize what it was doing to his wife of 4 years of marriage and his children so he made a commitment not to hurt himself.  Group Time: 11:00 - Noon  Participation Level:  None  Behavioral Response: Appropriate  Type of Therapy: Psycho-education Group  Summary of Progress: Patient participated in a group on discharge planning.  Cleophas Dunker, LMFT, CTS

## 2011-08-19 ENCOUNTER — Other Ambulatory Visit (HOSPITAL_COMMUNITY): Payer: Medicare PPO

## 2011-08-19 MED ORDER — CLONAZEPAM 0.5 MG PO TABS: 0.5000 mg | ORAL_TABLET | ORAL | Status: DC

## 2011-08-19 NOTE — Progress Notes (Signed)
    Daily Group Progress Note  Program: IOP  Group Time: 0900-1015  Participation Level: Active  Behavioral Response: Appropriate, Sharing and Motivated  Type of Therapy:  Process Group  Summary of Progress:   Group discussion about developing a plan for self care.  Considered including enjoyable things on To-do lists, relaxation breaks during work, and avoiding being self-critical.  This generated a lot of sharing about things that have worked or not worked so well.  Handout included other items as well.     Group Time: 1030 - 1200  Participation Level:  Active  Behavioral Response: Appropriate, Sharing and Motivated  Type of Therapy: Process Group  Summary of Progress:  Sean Hill presented a rather intellectual justification for wanting to die, but stated that he had promised his wife he would not kill himself.  This facilitated a good discussion of coping with suicidal thoughts or death wishes shared by most of the group.  He continued to indicate no sense of a reason to continue to live, but attended well to the conclusions of peers that were different than his.  Shonna Chock, APRN, MS

## 2011-08-19 NOTE — Progress Notes (Unsigned)
Patient ID: Sean Hill, male   DOB: 1940-11-09, 70 y.o.   MRN: [id number]    Pt seen today has dc his tegretol and notes no difference. Has trouble sleeping , discussed sleep hygeine.   Pt will also increase his Klonopin to 2 mg po q hs and will continue 0.25 mg  q am and 2 pm.

## 2011-08-22 ENCOUNTER — Other Ambulatory Visit (HOSPITAL_COMMUNITY): Payer: Medicare PPO | Attending: Psychiatry

## 2011-08-22 DIAGNOSIS — F331 Major depressive disorder, recurrent, moderate: Secondary | ICD-10-CM | POA: Insufficient documentation

## 2011-08-22 DIAGNOSIS — F39 Unspecified mood [affective] disorder: Secondary | ICD-10-CM | POA: Insufficient documentation

## 2011-08-22 DIAGNOSIS — F411 Generalized anxiety disorder: Secondary | ICD-10-CM | POA: Insufficient documentation

## 2011-08-22 MED ORDER — CLONAZEPAM 0.5 MG PO TABS: 0.2500 mg | ORAL_TABLET | ORAL | Status: DC
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
## 2011-08-22 NOTE — Progress Notes (Signed)
    Daily Group Progress Note  Program: IOP  Group Time: 9:00 - 10:30 a.m.  Participation Level: Active  Behavioral Response: Appropriate and Sharing  Type of Therapy:  Process Group  Summary of Progress: Patient reported that from last weeks' group he was feeling better somewhat.  He still talks about "getting his medications right."  He also reported that his wife told him she does not trust him not to try to kill himself unless he shows less depression.  Gave feedback that he and his wife would do well to attend support groups on bipolar disorder.  Group Time: 11:00 - Noon  Participation Level:  Active  Behavioral Response: Appropriate, Sharing and Motivated  Type of Therapy: Psycho-education Group  Summary of Progress: Patient participated in a group on calming interventions including biofeedback and mindfulness meditations.  Reports he is interested in buying the [employer] biofeedback software.  Cleophas Dunker, LMFT, CTS

## 2011-08-22 NOTE — Progress Notes (Signed)
Addended by: Margit Banda D on: 08/22/2011 10:46 PM   Modules accepted: Orders

## 2011-08-23 ENCOUNTER — Other Ambulatory Visit (HOSPITAL_COMMUNITY): Payer: Medicare PPO

## 2011-08-23 NOTE — Progress Notes (Signed)
    Daily Group Progress Note  Program: IOP  Group Time: 9:00 - 10:30 a.m.  Participation Level: Active  Behavioral Response: Appropriate, Sharing and Motivated  Type of Therapy:  Process Group  Summary of Progress: Patient reports feeling sedated and actually fell asleep during the group.  Patient talked about having bipolar disorder regarding liking the mania that lead him to taking risks throughout his life.  He did state taking risks he "did not have a lot of regret," and suggested he think it through in order to identify if his behavior affected others in his life.  Group Time: 11:00 - Noon  Participation Level:  Active  Behavioral Response: Appropriate, Sharing and Motivated  Type of Therapy: Psycho-education Group  Summary of Progress: Patient participated in the group on medication management.  Cleophas Dunker, LMFT, CTS

## 2011-08-24 ENCOUNTER — Other Ambulatory Visit (HOSPITAL_COMMUNITY): Payer: Medicare PPO

## 2011-08-24 NOTE — Progress Notes (Signed)
    Daily Group Progress Note  Program: IOP  Group Time: 9:00 - 10:30 a.m.  Participation Level: Active  Behavioral Response: Appropriate and Sharing  Type of Therapy:  Process Group  Summary of Progress: Patient continues to talk about his life being over.  He reports he does not want to commit suicide however he does not want to live as well.  He was confronting on talking about suicide in a way that he believes he is "special" compared to others who have felt suicidal.  Group Time: 11:00 - Noon  Participation Level:  Active  Behavioral Response: Appropriate and Sharing  Type of Therapy: Psycho-education Group  Summary of Progress: Patient participated in a group on how to cope with fear.  Sean Dunker, LMFT, CTS

## 2011-08-25 ENCOUNTER — Other Ambulatory Visit (HOSPITAL_COMMUNITY): Payer: Medicare PPO

## 2011-08-26 ENCOUNTER — Other Ambulatory Visit (HOSPITAL_COMMUNITY): Payer: Medicare PPO | Admitting: Psychiatry

## 2011-08-26 MED ORDER — OMEPRAZOLE 40 MG PO CPDR: 40.0000 mg | DELAYED_RELEASE_CAPSULE | Freq: Once | ORAL | Status: DC

## 2011-08-26 MED ORDER — NEFAZODONE HCL 100 MG PO TABS: 100.0000 mg | ORAL_TABLET | Freq: Two times a day (BID) | ORAL | Status: AC

## 2011-08-26 MED ORDER — VENLAFAXINE HCL ER 150 MG PO CP24: 150.0000 mg | ORAL_CAPSULE | ORAL | Status: DC

## 2011-08-26 MED ORDER — CLONAZEPAM 1 MG PO TABS: 1.0000 mg | ORAL_TABLET | Freq: Three times a day (TID) | ORAL | Status: DC

## 2011-08-26 MED ORDER — BUPROPION HCL ER (SR) 150 MG PO TB12: 150.0000 mg | ORAL_TABLET | Freq: Every day | ORAL | Status: DC

## 2011-08-26 MED ORDER — ZIPRASIDONE HCL 40 MG PO CAPS: ORAL_CAPSULE | ORAL | Status: DC

## 2011-08-26 NOTE — Progress Notes (Signed)
    Daily Group Progress Note  Program: IOP  Group Time: 1030-1200  Participation Level: Active  Behavioral Response: Appropriate, Sharing and Motivated  Type of Therapy:  Process Group  Summary of Progress: Micheal reported that he is doing better and defined what this meant for him.  He accepted the group talking about being uncomfortable with his idea that suicide might be an option for him again in the future.  He was clearly taking responsibility for being quite self-centered in his life and having regrets about how he treated his wife and children by neglect and not being a warm person.  He was able to give helpful feedback to others.  He denies any intent to take his life at this time.    Group Time: 0900 - 1015  Participation Level:  Active  Behavioral Response: Appropriate, Sharing and Motivated  Type of Therapy: Psycho-education Group  Summary of Progress:   Using the theme of forgiveness, we talked about forgiving and being forgiven, the benefits and the difficulties.  Used the example of the [place] practice:  I'm sorry.  Please forgive me.  Thank You.  I love you.  Good discussion by the group.  Shonna Chock, APRN, CNS

## 2011-08-29 ENCOUNTER — Telehealth (HOSPITAL_COMMUNITY): Payer: Self-pay | Admitting: Psychiatry

## 2011-08-29 ENCOUNTER — Other Ambulatory Visit (HOSPITAL_COMMUNITY): Payer: Medicare PPO | Admitting: Psychiatry

## 2011-08-29 NOTE — Progress Notes (Signed)
    Daily Group Progress Note  Program: IOP  Group Time: 9:00 - 10:30 a.m.  Participation Level: None  Behavioral Response:   Type of Therapy:  Process Group  Summary of Progress: Patient did not attend group due to not being able to obtain transportation.  Group Time: 11:00 - Noon  Participation Level:  None  Behavioral Response:   Type of Therapy: Psycho-education Group  Summary of Progress: Patient participated in the grief and loss group with Theda Belfast.  Patient did not attend.  Cleophas Dunker, LMFT, CTS

## 2011-08-30 ENCOUNTER — Other Ambulatory Visit (HOSPITAL_COMMUNITY): Payer: Medicare PPO | Admitting: Psychiatry

## 2011-08-30 NOTE — Progress Notes (Signed)
    Daily Group Progress Note  Program: IOP  Group Time: 9:00 - 10:30 a.m.  Participation Level: Active  Behavioral Response: Appropriate, Sharing and Motivated  Type of Therapy:  Process Group  Summary of Progress:  Patient stated he realized he was triggered emotionally by the fact that his wife takes care of his father-in-law and that his wife's sisters do not help.  He states he had an argument with his wife last night leading to suicidal ideation and he began to think about "how to do it in the least upsetting way for my wife when she finds me."  Reported he thought about being admitted and then decided he would put off killing himself.  He did state his wife would be living with the father-in-law for a while to help take care of him.  Patient stated he was "very concerned about being alone and did not know how he would cope.  (this Clinical research associate spoke with [name] regarding suicidal thinking and possible plan and was given more days in IOP as a result of the doctor evaluating him for safety).  Patient did state he was going to buy the HeartMath biofeedback software to help calm anxiety at home.  Also discussed ways patient could approach his wife concerning being upset about her caretaking.  Group Time: 11:00  - Noon  Participation Level:  Minimal  Behavioral Response: Appropriate and Sharing  Type of Therapy: Psycho-education Group  Summary of Progress: Patient participated in a group on recovery, gratitude and resilience.  Patient stated he had a difficult time thinking about what to be grateful for but did come up with wife, family, and grandchildren.  He continues to state his belief that "getting his medications right" will be the event that will change him and also, that the topics being presented to him do not apply, that his situation is "different."  Cleophas Dunker, LMFT, CTS

## 2011-08-31 ENCOUNTER — Ambulatory Visit (HOSPITAL_COMMUNITY): Payer: Medicare PPO | Admitting: Physician Assistant

## 2011-08-31 ENCOUNTER — Other Ambulatory Visit (HOSPITAL_COMMUNITY): Payer: Medicare PPO | Admitting: Psychiatry

## 2011-08-31 NOTE — Progress Notes (Signed)
    Daily Group Progress Note  Program: IOP  Group Time: 9:00 - 10:30 a.m.  Participation Level: Minimal  Behavioral Response: Appropriate  Type of Therapy:  Process Group  Summary of Progress: Patient reported he was not suicidal last night.  He did try to talk to his wife but reported "not getting response I wanted" namely she was defensive.  The group commended him on trying and suggested that this was his first attempt.  Group Time: 11:00 - Noon  Participation Level:  Minimal  Behavioral Response: Appropriate  Type of Therapy: Psycho-education Group  Summary of Progress: Patient participated in a group on HeartMath biofeedback software.  Patient states he bought the software to use at home to help decrease his anxiety.  Cleophas Dunker, LMFT, CTS

## 2011-09-01 ENCOUNTER — Other Ambulatory Visit (HOSPITAL_COMMUNITY): Payer: Medicare PPO

## 2011-09-01 NOTE — Progress Notes (Signed)
    Daily Group Progress Note  Program: IOP  Group Time: 0900 - 10:15 AM  Participation Level: Active  Behavioral Response: Appropriate and Sharing  Type of Therapy:  Psycho-education Group  Summary of Progress: Patient unable to identify one coping skill. Patient states that he is physically unable to participate in the risky activities he once used as a way to cope. "I am just waiting to die, I wouldn't hurt myself because I have to much to live for".     Group Time: 10:45 - 12:00 PM  Participation Level:  Active  Behavioral Response: Appropriate  Type of Therapy: Process Group  Summary of Progress: Patient shared that he is just waiting for the end of his life as it comes naturally. He shared that he does not believe in anything after this life and does not believe in "a higher power." Group gave multiple suggestions on ways to occupy his free time, patient resistant. Patient gave appropriate and helpful input to other group members.  [facility]

## 2011-09-02 ENCOUNTER — Other Ambulatory Visit (HOSPITAL_COMMUNITY): Payer: Medicare PPO

## 2011-09-02 NOTE — Progress Notes (Signed)
    Daily Group Progress Note  Program: IOP  Group Time: 0900 - 1045  Participation Level: Active  Behavioral Response: Appropriate, Sharing and Motivated  Type of Therapy:  Process Group  Summary of Progress: Shared once again his intellectual argument for suicide as euthanasia.  Did get more involved in his feelings about being ill or needing physical caregiver and how hard this would be for him.  However, he does not have a medical advanced directive, so group encouraged him to get that done and especially to share his feelings with his children in detail so that if he cannot make decisions, they will have his wishes well understood.  Group listened to a funny story about one of his adventures and suggested he could be writing or recording his stories for his grandchildren.    Group Time: 1100 1200  Participation Level:  Active  Behavioral Response: Appropriate, Sharing and Motivated  Type of Therapy: Psycho-education Group  Summary of Progress:   Education provided in greater depth about using Heartmath.  Also focused on seeking greater contentment by recognizing and giving up our "stories" about ourselves and others than interfere with living in the present.  Recognize that our interpretations are only that, not "the truth".   Shonna Chock, APRN, CNS    [facility]

## 2011-09-05 ENCOUNTER — Other Ambulatory Visit (HOSPITAL_COMMUNITY): Payer: Medicare PPO

## 2011-09-05 MED ORDER — CARBAMAZEPINE 200 MG PO TABS: 200.0000 mg | ORAL_TABLET | Freq: Three times a day (TID) | ORAL | Status: DC

## 2011-09-05 NOTE — Patient Instructions (Signed)
Patient has appointments set with Forde Radon on 09/19/11 @ 9:30 AM and [name] PA on 10/10/11 @ 1 PM

## 2011-09-06 ENCOUNTER — Other Ambulatory Visit (HOSPITAL_COMMUNITY): Payer: Medicare PPO

## 2011-09-07 ENCOUNTER — Encounter (HOSPITAL_COMMUNITY): Payer: Self-pay | Admitting: Psychiatry

## 2011-09-07 ENCOUNTER — Other Ambulatory Visit (HOSPITAL_COMMUNITY): Payer: Medicare PPO

## 2011-09-07 NOTE — Progress Notes (Signed)
  [facility] Intensive Outpatient Program Discharge Summary  Sean Hill [id number]  Admission date: 08-17-11 Discharge date: 09-05-11  Reason for admission: Depression.   70 yr old male was dc from inpatient unit on 07-29-11 was admitted for further stabilization. Pt attended all groups and was continued on his meds which were. 1, Effexxor XR 150 mg BID,  2, Geodon 40 mg q am and 80 mg q hs. 3,Klonopin 0.5 mg q am  And 2 pm and  2tabs q hs.. 4, WellbutrinSR 150 mg q am. 5, Tegretol 200 mg po BID 6, Relafen 500 mg po BID. 7, Protonix 40 mg q am.   Pt was continued on these meds but c/o of sedation so his Tegretol was dc and his Geodon was changed to 60 mg po BID.and his Klonopinwas increased to 0.5 mg am and 2 pm and 2 mg po q hs. Pt gradually stabilized but began feeling depressed so he was restarted on his Tegretol 200 mg BID. Pt did well after this . Sleep, aap-good. Mood was stable with no SI /HI . No Hallucinations / delusions.He was tol his meds well and coping well.  Mental status at Discharge. ------ Alert, O/3. Mood-stable, No SI / HI. No Hallucinations / Delusions . R/R memory-good, judgement /insight-good, concentration / recall-good.  Diagnosis. Axis I- Bipolar disorder.-Mixed, -Depressed. II------- deffered. III---tinnitus,Arthralgia. IV-------Problems with social environment. V------- GAF   60.  DC Meds- -------------------   Geodon 60 mg BID.   , Klonopin 0.5 mg am and 2 pm and 2 mg at hs   , Tegretol 200 mg BID,     Wellbutrin SR 150 mg q am, and Relafen and Protonix.  Diet -regular, Activity as tolerated. F/u - Hessie Diener watt for meds and Adella Hare for therapy. Progress in Program Toward Treatment Goals: -----Heide Guile, MD 09/07/2011

## 2011-09-07 NOTE — Discharge Summary (Signed)
Physician Discharge Summary  Patient ID: Sean Hill MRN: [id number] DOB/AGE: [id number] y.o.  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  Appetite: Yes, AEB: Good Appetite.   Mild>(1-10) >Severe  Hopelessness (1-10): 0  Depression (1-10): 0 to 1  Anxiety (1-10): 0   Suicidal Ideation: None - Adamantly denies any suicidal ideations.  Plan: No  Intent: No  Means: No   Homicidal Ideation: Adamantly denies any Homicidal Ideations.  Plan: No  Intent: No  Means: No   Mental Status: AO x 3  General Appearance /Behavior: Neat and Casual  Eye Contact: Good  Motor Behavior: Normal  Speech: Normal  Level of Consciousness: Alert  Mood: Depressed - Mildly Depressed.  Affect: Mildly Constricted  Anxiety Level: Resolved.  Thought Process: Coherent  Thought Content: WNL  Perception: Normal  Judgment: Good  Insight: Good  Cognition: Orientation time, place and person  Tinnitus: Unchanged.  The patient was very appreciative of the care he received while at behavioral health. He states "you have saved my life." He plans to begin Mental Health IOP early next week.   Treatment Plan Summary:  1. Daily contact with patient to assess and evaluate symptoms and progress in treatment  2. Medication management  3. The patient will deny suicidal ideations for 48 hours prior to discharge and have a depression and anxiety rating of 3 or less.   Plan:  1. Will continue current medications.  2. Will discontinue the medication Campral since the patient has received no benefit regarding his tinnitus.  3. The patient will be discharged today as requested and will begin Mental Health IOP  early next week.   SUICIDE RISK:  Minimal: No identifiable suicidal ideation. Patients presenting with no risk factors but with morbid ruminations; may be classified as minimal risk based on the severity of the depressive symptoms  Hospital Course: The patient was admitted to [facility] on 07/20/2011 for treatment of depression and possible bipolar symptoms.  The patient also was expressing suicidal ideations secondary to not being able to perform the physical activities he has in the past.  He medications were adjusted and he attended group therapy multiple times a day.  On 07/29/2011, his condition had improved to where discharge to outpatient follow up was appropriate.  Discharged Condition: At time of discharge, the patient denied any significant feelings of depression, anxiety or hopelessness.  He also denied any suicidal or homicidal ideations and was appreciative of the help he had received at this facility.  Consults:  None.  Discharge Exam: Blood pressure 137/74, pulse 76, temperature 96.8 F (36 C), temperature source Oral, resp. rate 18, height 5\' 8"  (1.727 m), weight 113.85 kg (250 lb 15.9 oz).  Disposition: Home or Self Care  Discharge Orders    Future Orders Please Complete By Expires   Diet - low sodium heart healthy                                                 Follow-up Information    Make an appointment with [facility]. (Walk in Clinic for  medication assistance 8:00-11:00 am M-F)    Contact information:   [address], [place] [address] [phone]       Follow up with [facility] on 08/02/2011. (Start IOP on 11/13 at 8:45 am)    Contact information:   [address] [place], [place] [address] [phone]         Signed: Franchot Gallo 09/07/2011, 4:20 PM

## 2011-09-08 ENCOUNTER — Other Ambulatory Visit (HOSPITAL_COMMUNITY): Payer: Medicare PPO

## 2011-09-08 ENCOUNTER — Other Ambulatory Visit (HOSPITAL_COMMUNITY): Payer: Self-pay

## 2011-09-09 ENCOUNTER — Other Ambulatory Visit (HOSPITAL_COMMUNITY): Payer: Medicare PPO

## 2011-09-12 ENCOUNTER — Other Ambulatory Visit (HOSPITAL_COMMUNITY): Payer: Medicare PPO

## 2011-09-19 ENCOUNTER — Ambulatory Visit (INDEPENDENT_AMBULATORY_CARE_PROVIDER_SITE_OTHER): Payer: Medicare PPO | Admitting: Psychology

## 2011-09-19 ENCOUNTER — Encounter (HOSPITAL_COMMUNITY): Payer: Self-pay | Admitting: Psychology

## 2011-09-19 DIAGNOSIS — F3132 Bipolar disorder, current episode depressed, moderate: Secondary | ICD-10-CM

## 2011-09-19 NOTE — Progress Notes (Signed)
   THERAPIST PROGRESS NOTE  Session Time: 9.30am-10:25am  Participation Level: Active  Behavioral Response: Well GroomedAlertDepressed  Type of Therapy: Individual Therapy  Treatment Goals addressed: Diagnosis: Bipolar D/O and goal 1.  Interventions: CBT and Supportive  Summary: AUTHOR HATLESTAD is a 70 y.o. male who presents with depressed mood and affect.  Pt is on f/u from IOP and transferred care to this provider while primary therapist, Eloisa Northern is on leave till mid Feb 2013.  Pt discussed his care in inpt and IOP acknowledging that d/c meds himself prior to inpt tx led to deteroration in his functioning.  Pt reported that still thinks about suicide daily but not w/ intent or plan and reports daily SI as baseline.  Pt discussed protective factor of wife and family and promise made to them to not harm self as long as able to care for self.  Pt reported that bought Heart Math program and using several times a day and finds beneficial in reducing anxiety.  Pt discussed goal to maintain current improvements and checkin w/ therapist until primary therapist returns.   Suicidal/Homicidal: Yeswithout intent/plan  Therapist Response: Introduced self to pt and explored w/ pt recent care and transitions.  Processed w/pt current mood and assessed for safety.  Explored w/ pt use of coping skills and engagement w/ others.  Encouraged continued use of heartmath.  Plan: Return again in 2 weeks.  Diagnosis: Axis I: Bipolar, Depressed    Axis II: Deferred    Atlee Kluth, LPC 09/19/2011

## 2011-09-21 NOTE — Telephone Encounter (Signed)
Can you contact this patient and see where we are in this process of referral, etc.  thx--PM

## 2011-09-22 ENCOUNTER — Other Ambulatory Visit (HOSPITAL_COMMUNITY): Payer: Self-pay | Admitting: Physician Assistant

## 2011-09-22 DIAGNOSIS — F319 Bipolar disorder, unspecified: Secondary | ICD-10-CM

## 2011-10-03 ENCOUNTER — Ambulatory Visit (INDEPENDENT_AMBULATORY_CARE_PROVIDER_SITE_OTHER): Payer: Medicare PPO | Admitting: Psychology

## 2011-10-03 DIAGNOSIS — F314 Bipolar disorder, current episode depressed, severe, without psychotic features: Secondary | ICD-10-CM

## 2011-10-03 NOTE — Progress Notes (Signed)
   THERAPIST PROGRESS NOTE  Session Time: 11.20am-12:04pm  Participation Level: Active  Behavioral Response: Well GroomedAlertDepressed  Type of Therapy: Individual Therapy  Treatment Goals addressed: Diagnosis: Bipolar I D/O and goal 1.   Interventions: CBT and Other: Deep Breathing  Summary: Sean Hill is a 71 y.o. male who presents with blunted affect.  Pt reported that he is still struggling w/ feeling foggy and feels related to medication.  Pt thought process was slow but coherent.  Pt expresses hope that medications can be reduced but yet effective to manage moods.  Pt reported some depressed mood couple days ago- slept almost 24hours.  Pt denies any SI or intent.  Pt recognized yesterday had missed 3 doses of Effexor and feels this may have contributed to mood.  Pt reported using heartmath several times a day and effective in reducing anxiety.  Pt expressed anxiety about upcoming appt w/ [name] and whether can still have good working relationship together.  Pt acknowledged other perspectives for lack of interactions w/ Hessie Diener when inpt.  Pt was able to use deep breathing to reduce restless and acknowledged increased anxiety in session.   Suicidal/Homicidal: Nowithout intent/plan  Therapist Response: Assessed pt current functioning per his report.  Processed w/ pt increased depressed mood reported and potential contributing factors.  Processed w/pt anxiety about upcoming appointment and assisted in challenging cognitive distortions that lack of interactions w/ provider reflected their relationship.  Reflected to pt increased motor activity in leg and prompted for use of deep breathing skills.  Plan: Return again in 2 weeks, f/u as schedule w/ [name], PA on 10/10/11 and pt to call prn to counselor next week.  Diagnosis: Axis I: Bipolar, Depressed    Axis II: Deferred    YATES,LEANNE, LPC 10/03/2011

## 2011-10-10 ENCOUNTER — Ambulatory Visit (INDEPENDENT_AMBULATORY_CARE_PROVIDER_SITE_OTHER): Payer: MEDICARE | Admitting: Physician Assistant

## 2011-10-10 DIAGNOSIS — F319 Bipolar disorder, unspecified: Secondary | ICD-10-CM

## 2011-10-10 MED ORDER — GABAPENTIN 300 MG PO CAPS: 300.0000 mg | ORAL_CAPSULE | Freq: Three times a day (TID) | ORAL | Status: DC

## 2011-10-10 MED ORDER — BUPROPION HCL ER (SR) 150 MG PO TB12: 150.0000 mg | ORAL_TABLET | Freq: Every day | ORAL | Status: DC

## 2011-10-10 NOTE — Progress Notes (Signed)
[facility] [id number] Progress Note  Sean Hill [id number] 71 y.o.  10/10/2011 1:42 PM  Chief Complaint: Depression, anxiety, irritability  History of Present Illness: Sean Hill first clinic visit after having been hospitalized for manic behavior after he stopped taking his medications. After leaving the hospital. He attended the IOP group for about 2 weeks. While in the IOP his medications were adjusted. Sean Hill was seen with his wife, Sean Hill, today. She states that this is one of his "dumb" days . Sean Hill states that one of the medications that he is on is causing him to feel like a zombie. He continues to experience periods of depression, as well as periods of irritability and agitation. Some days he will want to stay in bed all day long. He also endorses having had some visual hallucinations after leaving the hospital.  Suicidal Ideation: No Plan Formed: No Patient has means to carry out plan: No  Homicidal Ideation: No Plan Formed: No Patient has means to carry out plan: No  Review of Systems: Psychiatric: Agitation: Yes Hallucination: Yes Depressed Mood: Yes Insomnia: No Hypersomnia: Yes Altered Concentration: Yes Feels Worthless: No Grandiose Ideas: No Belief In Special Powers: No New/Increased Substance Abuse: No Compulsions: No  Neurologic: Headache: No Seizure: No Paresthesias: No  Past Medical Family, Social History:   Outpatient Encounter Prescriptions as of 10/10/2011  Medication Sig Dispense Refill  . carbamazepine (TEGRETOL) 200 MG tablet Take 1 tablet (200 mg total) by mouth 3 (three) times daily.  90 tablet  2  . clonazePAM (KLONOPIN) 1 MG tablet Take 1 tablet (1 mg total) by mouth 3 (three) times daily.  90 tablet  0  . nabumetone (RELAFEN) 500 MG tablet Take 1 tablet (500 mg total) by mouth 2 (two) times daily in the am and at bedtime..  30 tablet  0  . omeprazole (PRILOSEC) 40 MG capsule Take 1 capsule (40 mg total) by mouth once.  30  capsule  0  . traZODone (DESYREL) 100 MG tablet TAKE ONE TABLET BY MOUTH AT BEDTIME  90 tablet  0  . venlafaxine (EFFEXOR-XR) 150 MG 24 hr capsule Take 1 capsule (150 mg total) by mouth 2 (two) times daily at 8am and 2pm.  60 capsule  1  . ziprasidone (GEODON) 40 MG capsule Take one in the am and 2 at bedtime  90 capsule  0  . DISCONTD: buPROPion (WELLBUTRIN SR) 150 MG 12 hr tablet Take 1 tablet (150 mg total) by mouth daily.  30 tablet  0  . DISCONTD: gabapentin (NEURONTIN) 100 MG capsule Take 100 mg by mouth 3 (three) times daily.        Marland Kitchen buPROPion (WELLBUTRIN SR) 150 MG 12 hr tablet Take 1 tablet (150 mg total) by mouth daily.  30 tablet  1  . gabapentin (NEURONTIN) 300 MG capsule Take 1 capsule (300 mg total) by mouth 3 (three) times daily.  90 capsule  1    Past Psychiatric History/Hospitalization(s): Anxiety: Yes Bipolar Disorder: Yes Depression: Yes Mania: Yes Psychosis: Yes Schizophrenia: No Personality Disorder: No Hospitalization for psychiatric illness: Yes History of Electroconvulsive Shock Therapy: No Prior Suicide Attempts: Yes  Physical Exam: Constitutional:  There were no vitals taken for this visit.  General Appearance: alert, oriented, no acute distress, well nourished and obese  Musculoskeletal: Strength & Muscle Tone: within normal limits Gait & Station: normal Patient leans: N/A  Psychiatric: Speech (describe rate, volume, coherence, spontaneity, and abnormalities if any): Clear and even  Thought Process (describe  rate, content, abstract reasoning, and computation): Slightly delayed  Associations: Coherent, Relevant and Circumstantial  Thoughts: normal  Mental Status: Orientation: oriented to person, place, time/date and situation Mood & Affect: depressed affect Attention Span & Concentration: Intact  Medical Decision Making (Choose Three): Established Problem, Stable/Improving (1), Review of Medication Regimen & Side Effects (2) and Review of New  Medication or Change in Dosage (2)  Assessment: Axis I: Bipolar disorder, not otherwise specified; mixed episode  Axis II: Deferred  Axis III: Obesity  Axis IV: Minimal  Axis V: 50   Plan: We will decrease his Tegretol from 200 mg 3 times daily to 200 mg twice daily, and increase his Neurontin from 100 mg 3 times a day to 300 mg 3 times a day. We will continue his other medications including Klonopin, Geodon, trazodone, Wellbutrin, and Effexor as currently prescribed. He will return for followup in 3-4 weeks. He will continue to visit frequently with his counselor. He is to call if there are any problems. He has been educated on increasing his exercise level as well as changing his diet to healthier choices.  Ottis Sarnowski, PA 10/10/2011

## 2011-10-12 NOTE — Progress Notes (Signed)
Addended by: Clarene Essex on: 10/12/2011 10:17 AM   Modules accepted: Level of Service

## 2011-10-18 ENCOUNTER — Ambulatory Visit (INDEPENDENT_AMBULATORY_CARE_PROVIDER_SITE_OTHER): Payer: MEDICARE | Admitting: Psychology

## 2011-10-18 DIAGNOSIS — F314 Bipolar disorder, current episode depressed, severe, without psychotic features: Secondary | ICD-10-CM

## 2011-10-18 NOTE — Progress Notes (Signed)
   THERAPIST PROGRESS NOTE  Session Time: 9.30am-10:15am  Participation Level: Active  Behavioral Response: NeatDrowsyDepressed  Type of Therapy: Individual Therapy  Treatment Goals addressed: Diagnosis: Bipolar I D/O and goal 1.  Interventions: CBT and Supportive  Summary: Sean Hill is a 71 y.o. male who presents with depressed mood and affect and reports feeling very drowsy (returning home from hospital at 2am last night).  Pt reported a lot of stressors over the past week including father-in-law in the hospital and wife receiving a dx of B-cell Non hodgkin's lymphona yesterday.  Pt discussed how if wife dies it's "a deal breaker" for him and would kill himself and believes to be euthanasia.  Pt denied any current plan or intent for suicide.  Pt expressed how he has been a support to wife in past w/ breast cancer and her tx and plan for being support to her now as well and that this is important focus for positive self care of own illness.  Pt reported that still dealing w/ a lot of drowsiness w/ depression and medication side effects.  Pt did report that med eval w/ [name] went well and feels confident in their working relationship.  Suicidal/Homicidal: Nowithout intent/plan  Therapist Response: assessed current functioning per pt report.  Assessed for pt safety give recent stressors reported.  Explored w/ pt use of coping skills w/ breathing and daily positive self care.  Discussed support network for pt and his wife during this time.  Plan: Return again in 1 weeks.  Diagnosis: Axis I: Bipolar, Depressed    Axis II: No diagnosis    YATES,LEANNE, LPC 10/18/2011

## 2011-10-25 ENCOUNTER — Ambulatory Visit (INDEPENDENT_AMBULATORY_CARE_PROVIDER_SITE_OTHER): Payer: MEDICARE | Admitting: Psychology

## 2011-10-25 ENCOUNTER — Other Ambulatory Visit (HOSPITAL_COMMUNITY): Payer: Self-pay | Admitting: Physician Assistant

## 2011-10-25 DIAGNOSIS — F319 Bipolar disorder, unspecified: Secondary | ICD-10-CM

## 2011-10-25 DIAGNOSIS — F314 Bipolar disorder, current episode depressed, severe, without psychotic features: Secondary | ICD-10-CM

## 2011-10-25 MED ORDER — ZIPRASIDONE HCL 60 MG PO CAPS: 60.0000 mg | ORAL_CAPSULE | Freq: Two times a day (BID) | ORAL | Status: DC

## 2011-10-25 MED ORDER — VENLAFAXINE HCL ER 150 MG PO CP24: 150.0000 mg | ORAL_CAPSULE | ORAL | Status: DC

## 2011-10-25 NOTE — Progress Notes (Signed)
   THERAPIST PROGRESS NOTE  Session Time: 9.30am-10:12am  Participation Level: Active  Behavioral Response: Casual and Well GroomedDrowsyDepressed  Type of Therapy: Individual Therapy  Treatment Goals addressed: Coping and Diagnosis: Bipolar I D/O.  Interventions: CBT and Strength-based  Summary: Sean Hill is a 71 y.o. male who presents with depressed affect- and drowsy as pt reports tired around this time in the morning following taken his medication.  Pt reported that his fatherinlaw died on 2011/11/19 and had memorial service yesterday.  Pt reported that he did sleep 24 hours on 10/23/11 and also missed his morning meds yesterday feeling increased hyper and shaky.  Pt also reported on upcoming treatment for wife's cancer and that prognosis has been given that very treatable.  Pt discussed need to restart his [employer] for coping,  Receptive to setting alarm for reminder of meds and discussed feeling purpose w/ caring for his wife and care for his fatherinlaw.    Suicidal/Homicidal: Nowithout intent/plan  Therapist Response: Assessed pt current functioning per his report.  Processed w/ pt stressors over past week and how he has coped through and awareness of needs for managing his meds more effectively. Discussed use of phone alarm for reminder and having travel pill box as well.  Processed w/pt use of other coping skills and encouraged restart of heartmath.  Reflected w/ pt reports of purpose and how this has had a positive effect on pt.  Plan: Return again in 1 week to f/u w/ [name], PA; transition to primary therapist who is returning in 2 weeks, call prn to this counselor prior to appt w/ Maxcine Ham.  Diagnosis: Axis I: Bipolar, Depressed    Axis II: No diagnosis    YATES,LEANNE, LPC 10/25/2011

## 2011-11-01 ENCOUNTER — Ambulatory Visit (INDEPENDENT_AMBULATORY_CARE_PROVIDER_SITE_OTHER): Payer: MEDICARE | Admitting: Physician Assistant

## 2011-11-01 DIAGNOSIS — F319 Bipolar disorder, unspecified: Secondary | ICD-10-CM

## 2011-11-02 NOTE — Progress Notes (Signed)
   [facility] Follow-up Outpatient Visit  Sean Hill 03-18-1941  Date: 11/01/11   Subjective: Sean Hill presents today to follow-up on his medications for Bipolar Disorder.  He complains that his current regimen is causing significant sedation, and he spends much of his time in bed.  He did not take his morning meds today, and he is able to function, but feels he is becoming more irritable.  The reduction of the Tegregtol at his last appt has not made significant improvement in his level of sedation.  He denies any SI/HI or AVH.  He is eating well, and is sleeping excessively.  He expresses much concern about his wife's health, as she has been diagnosed with Non-Hogkins Lymphoma, and is beginning chemotherapy.    There were no vitals filed for this visit.  Mental Status Examination  Appearance: Well groomed and casually dressed Alert: Yes Attention: good  Cooperative: Yes Eye Contact: Good Speech: clear and even Psychomotor Activity: Normal Memory/Concentration: intact Oriented: person, place, time/date and situation Mood: Depressed, Hopeless and Irritable Affect: Blunt and Constricted Thought Processes and Associations: Circumstantial and Intact Fund of Knowledge: Good Thought Content:  Insight: Fair Judgement: Fair  Diagnosis: Bipolar disorder  Treatment Plan: We will further decrease his Tegretol to 100mg  twice daily, and have him decrease his Klonopin to 1/2 tablet each morning and afternoon, with a full tablet at bedtime.  He will resume 1:1 with Maxcine Ham, and follow-up in 3 - 4 weeks for medication management.  Zlaty Alexa, PA

## 2011-11-04 ENCOUNTER — Encounter: Payer: Self-pay | Admitting: Family Medicine

## 2011-11-04 ENCOUNTER — Telehealth (HOSPITAL_COMMUNITY): Payer: Self-pay | Admitting: Psychology

## 2011-11-04 ENCOUNTER — Ambulatory Visit (INDEPENDENT_AMBULATORY_CARE_PROVIDER_SITE_OTHER): Payer: MEDICARE | Admitting: Family Medicine

## 2011-11-04 DIAGNOSIS — N3941 Urge incontinence: Secondary | ICD-10-CM

## 2011-11-04 DIAGNOSIS — J209 Acute bronchitis, unspecified: Secondary | ICD-10-CM

## 2011-11-04 MED ORDER — PREDNISONE 20 MG PO TABS: ORAL_TABLET | ORAL | Status: DC

## 2011-11-04 MED ORDER — ALBUTEROL SULFATE HFA 108 (90 BASE) MCG/ACT IN AERS: 2.0000 | INHALATION_SPRAY | RESPIRATORY_TRACT | Status: DC | PRN

## 2011-11-04 MED ORDER — OXYBUTYNIN CHLORIDE ER 5 MG PO TB24: ORAL_TABLET | ORAL | Status: DC

## 2011-11-04 NOTE — Progress Notes (Signed)
OFFICE NOTE  11/04/2011  CC:  Chief Complaint  Patient presents with  . URI    ? flu, clammy, cough, HA, nasal congestion, labored breathing began yesterday.  Has been at hospital with family last 2 weeks     HPI: Patient is a 71 y.o. Caucasian male who is here for cough. Pt presents complaining of respiratory symptoms for 2  days.  Mostly nasal congestion/runny nose, sneezing, and PND cough, ST.  Worst symptoms seems to be the chest tightness, +SOB.  Lately the symptoms seem to be worsening. Sinus pressure and HA in frontal/temples region.  Symptoms made worse by activity.  Symptoms improved by ibuprofen. Smoker? never Recent sick contact? Possible  Muscle or joint aches? Nothing new or significant.  Fatigue+.  Appetite down.   Flu shot this season at least 2 wks ago? yes Spending time at hospital a lot lately with sick family members, not getting much sleep.  ROS: no n/v/d or abdominal pain.  No rash.  No neck stiffness.   +Mild fatigue.  +Mild appetite loss.  Also, c/o urinary urgency without frequency, obstuctive sx's, without burning with urination.  No hematuria. Onset after additional psych meds started.   No nocturia.  Pertinent PMH:  Past Medical History  Diagnosis Date  . Bipolar disorder     Has psychiatrist.  BH admission (suicidal) 07/20/11.  Marland Kitchen Hyperlipemia     Intol of meds: GI side effects  . 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  No hx of MRSA infection.  . 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 Depression   . Nephrolithiasis   . GERD (gastroesophageal reflux disease)   . Inguinal hernia 02/25/11    Left sided hernia with fat  . Hip pain, left 2011-2012    Left hip and groin: MRI pelvis and left hip 02/18/11 showed NORMAL hip, with small inguinal hernia containing fat and sigmoid diverticulosis.  Hip pain presumably referred pain from hernia/diverticular dz??.  . Cervical spondylosis     Primarily C4-5 and  C5-6--referred to [facility] 02/2011.   Past surgical, social, and family history reviewed and no changes noted since last office visit.  MEDS:  Outpatient Prescriptions Prior to Visit  Medication Sig Dispense Refill  . buPROPion (WELLBUTRIN SR) 150 MG 12 hr tablet Take 1 tablet (150 mg total) by mouth daily.  30 tablet  1  . carbamazepine (TEGRETOL) 200 MG tablet Take 1 tablet (200 mg total) by mouth 3 (three) times daily.  90 tablet  2  . clonazePAM (KLONOPIN) 1 MG tablet Take 1 tablet (1 mg total) by mouth 3 (three) times daily.  90 tablet  0  . gabapentin (NEURONTIN) 300 MG capsule Take 1 capsule (300 mg total) by mouth 3 (three) times daily.  90 capsule  1  . traZODone (DESYREL) 100 MG tablet TAKE ONE TABLET BY MOUTH AT BEDTIME  90 tablet  0  . venlafaxine (EFFEXOR-XR) 150 MG 24 hr capsule Take 1 capsule (150 mg total) by mouth 2 (two) times daily at 8am and 2pm.  60 capsule  1  . ziprasidone (GEODON) 60 MG capsule Take 1 capsule (60 mg total) by mouth 2 (two) times daily with a meal.  60 capsule  1  . nabumetone (RELAFEN) 500 MG tablet Take 1 tablet (500 mg total) by mouth 2 (two) times daily in the am and at bedtime..  30 tablet  0  .  omeprazole (PRILOSEC) 40 MG capsule Take 1 capsule (40 mg total) by mouth once.  30 capsule  0    PE: Blood pressure 137/82, pulse 77, temperature 98.2 F (36.8 C), temperature source Temporal, weight 263 lb (119.296 kg), SpO2 94.00%. VS: noted--normal. Gen: alert, NAD, NONTOXIC APPEARING. HEENT: eyes without injection, drainage, or swelling.  Ears: EACs clear, TMs with normal light reflex and landmarks.  Nose: Clear rhinorrhea, with some dried, crusty exudate adherent to mildly injected mucosa.  No purulent d/c.  No paranasal sinus TTP.  No facial swelling.  Throat and mouth without focal lesion.  No pharyngial swelling, erythema, or exudate.   Neck: supple, no LAD.   LUNGS: CTA bilat, nonlabored resps.  Post-exhalation  coughing.  NO prolongation of exp phase. CV: RRR, no m/r/g. EXT: no c/c/e SKIN: no rash  IMPRESSION AND PLAN: 1) Acute bronchitis: prednisone 40mg  qd x 5d, albuterol HFA 2 puffs q4h prn, mucinex dm or robitussin dm otc prn.   2) Urinary urgency, likely med related. Discussed options, wants to try ditropan XL 5mg , 1-2 qAM.  FOLLOW UP:  4-6 wks to recheck urinary urgency

## 2011-11-07 ENCOUNTER — Encounter (HOSPITAL_COMMUNITY): Payer: Self-pay | Admitting: *Deleted

## 2011-11-07 NOTE — Progress Notes (Signed)
Patient called over weekend. Was diagnosed with bronchitis and put on Prednisone over the weekend. The Prednisone makes him jumpy and feel manic, so he took extra Klonopin. Instructed patient to take less Klonopin or go back to prescribed dose as Prednisone tapered.  Patient was taking Prednisone to get over bronchitis quicker so that her could go see his wife-in hospital with Non-Hodgkins lymphoma.

## 2011-11-08 ENCOUNTER — Ambulatory Visit (INDEPENDENT_AMBULATORY_CARE_PROVIDER_SITE_OTHER): Payer: MEDICARE | Admitting: Psychiatry

## 2011-11-08 ENCOUNTER — Telehealth: Payer: Self-pay | Admitting: Family Medicine

## 2011-11-08 DIAGNOSIS — F319 Bipolar disorder, unspecified: Secondary | ICD-10-CM

## 2011-11-08 MED ORDER — HYDROCODONE-HOMATROPINE 5-1.5 MG/5ML PO SYRP: ORAL_SOLUTION | ORAL | Status: AC

## 2011-11-08 NOTE — Telephone Encounter (Signed)
Cough is only symptom that remains for patient.  Sometimes productive.  Pt is using inhaler and taking prednisone.  Pt is hydrating well.  Pt would like cough syrup or something to help knock out cough.  Wife in hospital with pneumonia now and is starting chemo and he would like to be there with her.  Pt aware Dr. Milinda Cave out of office until this afternoon and is OK with that.

## 2011-11-08 NOTE — Telephone Encounter (Signed)
See notes related to phone call

## 2011-11-08 NOTE — Progress Notes (Signed)
   THERAPIST PROGRESS NOTE  Session Time: 3:00-3:50 pm  Participation Level: Active  Behavioral Response: CasualAlertDepressed  Type of Therapy: Individual Therapy  Treatment Goals addressed: Diagnosis: Bipolar Disorder  Interventions: Strength-based, Supportive and Reframing  Summary: Sean Hill is a 72 y.o. male who presents with Bipolar Disorder. This is writers first visit with patient following writers maternity leave. Patient updated Clinical research associate on events since last session including the death of his father in law in 2022/10/28, who patient was very close to and patients wife recent cancer diagnosis. Patient described the sadness and fear he has over his wife's cancer and how she is still in the hospital for active treatment.  Patient denies being suicidal and states he has been managing his depressive symptoms well considering these life stressors. Patient states he is starting to become active in the church again and describes trying to be less judgmental towards others.  Suicidal/Homicidal: Nowithout intent/plan  Therapist Response: Assessed patients current level of functioning, provided supportive counseling given his recent loss and wife's cancer diagnosis. Challenged negative thoughts and his tendency to jump to negative conclusions, assessed patient for active suicidal thoughts.   Plan: Return again in 1 weeks. Resume weekly counseling and update treatment plan at next session.  Diagnosis: Axis I: Bipolar, mixed    Axis II: none    Melanee Cordial E, LCSW 11/08/2011

## 2011-11-08 NOTE — Telephone Encounter (Signed)
Notified pt RX faxed to pharmacy.  Drowsiness warnings given.

## 2011-11-08 NOTE — Telephone Encounter (Signed)
Hycodan rx printed.  Please advise pt that this medication usually causes drowsiness and/or mild impairment so he should not drive while taking this med. thx

## 2011-11-09 ENCOUNTER — Telehealth: Payer: Self-pay | Admitting: *Deleted

## 2011-11-09 MED ORDER — AZITHROMYCIN 250 MG PO TABS: ORAL_TABLET | ORAL | Status: DC

## 2011-11-09 MED ORDER — PREDNISONE 20 MG PO TABS: ORAL_TABLET | ORAL | Status: DC

## 2011-11-09 NOTE — Telephone Encounter (Signed)
Pt called stating he needed to have a "come to [facility] meeting" with Dr. Milinda Cave and myself.  Pt states we called in cough syrup for him yesterday, but it has done nothing to help cough.  Pt coughed through most of phone call.  Pt states he is no better.  Cough is not productive, but "this stuff" is just sitting in his chest and not breaking up.  Pt states he is worse now than when he was seen.  Denies any fever.  Is hydrating well with hot and cold liquids. Please advise next step. Thanks.

## 2011-11-09 NOTE — Telephone Encounter (Signed)
I have sent in 2 new rx's: azithromycin x 5d and more prednisone (20mg  qd x 5d and then 1/2 20mg  tab qd x 6d). Encourage him to be patient, as these kinds of respiratory illnesses take a couple of weeks usually to get much improved. If not improved in 5d then he needs to come in for o/v f/u.

## 2011-11-09 NOTE — Telephone Encounter (Signed)
Advised and pt is agreeable.

## 2011-11-11 ENCOUNTER — Telehealth: Payer: Self-pay | Admitting: Internal Medicine

## 2011-11-11 NOTE — Telephone Encounter (Signed)
Ok with me 

## 2011-11-11 NOTE — Telephone Encounter (Signed)
Phone note sent to Dr Milinda Cave

## 2011-11-11 NOTE — Telephone Encounter (Signed)
Fine with me

## 2011-11-11 NOTE — Telephone Encounter (Signed)
Pt would like to switch from Dr Milinda Cave to Dr Artist Pais because family sees Dr Artist Pais and location is closer.

## 2011-11-15 ENCOUNTER — Ambulatory Visit (INDEPENDENT_AMBULATORY_CARE_PROVIDER_SITE_OTHER): Payer: MEDICARE | Admitting: Psychiatry

## 2011-11-15 ENCOUNTER — Telehealth (HOSPITAL_COMMUNITY): Payer: Self-pay | Admitting: Physician Assistant

## 2011-11-15 DIAGNOSIS — F319 Bipolar disorder, unspecified: Secondary | ICD-10-CM

## 2011-11-15 NOTE — Progress Notes (Signed)
   THERAPIST PROGRESS NOTE  Session Time: 3:00-3:50 pm  Participation Level: Active  Behavioral Response: Fairly GroomedAlertAnxious and Depressed  Type of Therapy: Individual Therapy  Treatment Goals addressed: Diagnosis: Bipolar disorder  Interventions: Solution Focused and Supportive  Summary: Sean Hill is a 71 y.o. male who presents with anxiety and depression. Patient reports concerns about taking a medication for his bronchitis and it increasing mania symptoms and leaving messages for his PA and not hearing back. Patient reports feeling unimportant and questioning a need to see a different PA. Patients appeared extremely anxious while discussing the possibility of addressing these concerns with the PA and stated he fears conflict. Patient was unable to come to a resolution due to his severe anxiety regarding the issue. Patient states he is managing his mood with his wife's cancer treatment and is stable.    Suicidal/Homicidal: Nowithout intent/plan  Therapist Response: Problem solved options for patient resolving the situation with the PA and assessed patients overall level of functioning.   Plan: Return again in 1 weeks.  Diagnosis: Axis I: Bipolar, mixed    Axis II: none    Irianna Gilday E, LCSW 11/15/2011

## 2011-11-15 NOTE — Telephone Encounter (Signed)
lmom for pt to call back

## 2011-11-15 NOTE — Telephone Encounter (Signed)
Tried to return pt's call regarding medications to take while he is on predisone as prescribed by another provider for bronchitis.  Left message on answering machine.

## 2011-11-17 NOTE — Telephone Encounter (Signed)
Pt is aware new pcp Dr Artist Pais

## 2011-11-22 ENCOUNTER — Ambulatory Visit (INDEPENDENT_AMBULATORY_CARE_PROVIDER_SITE_OTHER): Payer: MEDICARE | Admitting: Physician Assistant

## 2011-11-22 DIAGNOSIS — F319 Bipolar disorder, unspecified: Secondary | ICD-10-CM

## 2011-11-22 MED ORDER — GABAPENTIN 300 MG PO CAPS: 600.0000 mg | ORAL_CAPSULE | Freq: Three times a day (TID) | ORAL | Status: DC

## 2011-11-22 MED ORDER — BUPROPION HCL ER (SR) 150 MG PO TB12: 150.0000 mg | ORAL_TABLET | Freq: Every day | ORAL | Status: DC

## 2011-11-22 NOTE — Progress Notes (Signed)
   [facility] Behavioral Health Follow-up Outpatient Visit  Sean Hill December 23, 1940  Date: 11/22/11   Subjective: Sean Hill presents today with his wife to followup on medications prescribed for his bipolar disorder. He has recently been prescribed prednisone to take for a bout of bronchitis. He is no longer on his prednisone, and has decreased his Klonopin to his previous dose, which is 1/2 mg each morning, 1/2 mg at midday, and 1 mg at bedtime. He had increased it slightly while on the prednisone to control his increasingly manic behavior. He continues to experience a significant amount of anxiety and irritability. He is sleeping well at nighttime. His mind has cleared significantly since reducing the Tegretol, and he wishes to discontinue that medication. His wife raves that Sean Hill is doing extremely well, and he is taking care of her while she is undergoing chemotherapy for non-Hodgkin's lymphoma. Sean Hill denies any suicidal or homicidal ideation. He denies any auditory or visual hallucinations.  There were no vitals filed for this visit.  Mental Status Examination  Appearance: Casual Alert: Yes Attention: good  Cooperative: Yes Eye Contact: Good Speech: Clear and even Psychomotor Activity: Normal Memory/Concentration: Intact Oriented: person, place, time/date and situation Mood: Depressed, mildly Affect: Constricted Thought Processes and Associations: Goal Directed and Logical Fund of Knowledge: Good Thought Content:  Insight: Fair Judgement: Good  Diagnosis: Bipolar disorder  Treatment Plan: We will increase his Neurontin to 600 mg 3 times daily to target anxiety and irritability. We will further taper and discontinue his Tegretol. We will continue the Wellbutrin S. are 150 mg daily Klonopin 1 mg up to 3 times daily, trazodone 100 mg at bedtime and Effexor XR 150 mg twice daily. He will followup in one month.  Kennede Lusk, PA

## 2011-11-25 ENCOUNTER — Encounter (HOSPITAL_COMMUNITY): Payer: Self-pay | Admitting: Psychiatry

## 2011-11-25 ENCOUNTER — Ambulatory Visit (INDEPENDENT_AMBULATORY_CARE_PROVIDER_SITE_OTHER): Payer: MEDICARE | Admitting: Psychiatry

## 2011-11-25 DIAGNOSIS — F319 Bipolar disorder, unspecified: Secondary | ICD-10-CM

## 2011-11-25 NOTE — Progress Notes (Signed)
   THERAPIST PROGRESS NOTE  Session Time: 3:00-3:50 pm  Participation Level: Active  Behavioral Response: Fairly GroomedAlertEuthymic  Type of Therapy: Individual Therapy  Treatment Goals addressed: Diagnosis: Bipolar, most recent episode manic  Interventions: DBT  Summary: Sean Hill is a 71 y.o. male who presents with Bipolar disorder with most recent episode manic. He reports mild mania and describes it as "wanting to do everything right now" with a sense if immediacy or urgency. Patient states he met with his PA, [name] and his anger he had towards him in the last session was a misunderstanding. Patient states he is trying to manage his emotions better and be less reactive by using religion as a tool to help remind him to be a better person. Patient gave examples of how he continues to be emotionally reactive and struggle with making good decisions. Patient was introduced the the DBT skill of mindfulness and practiced the skill of "observing" and "describing" using only facts instead of patiens tendency to use opinions. Patient stated it was very helpful and states he will try to use this skill over the next week when he feels he is emotionally escalating.   Suicidal/Homicidal: Nowithout intent/plan  Therapist Response: Introduced patient to the DBT skill of mindfulness to assist with emotion regulation and to help patient manage his negative assumptions that fuel his anger. Patient responded well and appears interested in learning other DBT skills to manage emotional reactivity.   Plan: Return again in 1 week.  Diagnosis: Axis I: Bipolar, Manic    Axis II: None    Jamecia Lerman E, LCSW 11/25/2011

## 2011-11-28 ENCOUNTER — Ambulatory Visit (INDEPENDENT_AMBULATORY_CARE_PROVIDER_SITE_OTHER): Payer: MEDICARE | Admitting: Internal Medicine

## 2011-11-28 ENCOUNTER — Encounter: Payer: Self-pay | Admitting: Internal Medicine

## 2011-11-28 VITALS — BP 132/70 | HR 76 | Temp 98.1°F | Ht 70.0 in | Wt 258.0 lb

## 2011-11-28 DIAGNOSIS — K219 Gastro-esophageal reflux disease without esophagitis: Secondary | ICD-10-CM | POA: Insufficient documentation

## 2011-11-28 DIAGNOSIS — R3129 Other microscopic hematuria: Secondary | ICD-10-CM | POA: Insufficient documentation

## 2011-11-28 DIAGNOSIS — R3915 Urgency of urination: Secondary | ICD-10-CM

## 2011-11-28 LAB — POCT URINALYSIS DIPSTICK
Bilirubin, UA: NEGATIVE
Ketones, UA: NEGATIVE
Leukocytes, UA: NEGATIVE

## 2011-11-28 MED ORDER — OMEPRAZOLE-SODIUM BICARBONATE 40-1100 MG PO CAPS: 1.0000 | ORAL_CAPSULE | Freq: Every day | ORAL | Status: DC

## 2011-11-28 NOTE — Assessment & Plan Note (Signed)
71 year old with symptoms of urinary urgency has microscopic hematuria of unclear etiology. Refer to urology for further workup.

## 2011-11-28 NOTE — Assessment & Plan Note (Addendum)
Patient having persistent symptoms despite taking over-the-counter PPI. Increase Zegerid dose to 40/1100 mg once daily. Anti reflux handout provided.  If persistent, symptoms we discussed GI referral.

## 2011-11-28 NOTE — Assessment & Plan Note (Signed)
UA only shows microscopic hematuria.   No hx or sign to suggest prostatitis.  Refer to urology for further evaluation.  Question, symptoms related to psychiatric medication.  No glucouria.

## 2011-11-28 NOTE — Progress Notes (Signed)
Subjective:    Patient ID: Sean Hill, male    DOB: 06-02-1941, 71 y.o.   MRN: [id number]  HPI  71 year old white male with history of bipolar disorder and gastroesophageal reflux disease to establish. Patient is switching from Dr. Milinda Cave.   Patient reports history of gastroesophageal reflux disease. He is been using over-the-counter Zegerid. But despite taking PPI he is experiencing persistent reflux symptoms.  He reports being evaluated by a gastroenterologist in the remote past but does not recall having EGD. He denies any lower esophageal dysphagia. He denies weight loss.  Patient also complains of urinary urgency over the last 2 months. This is not associated with decrease in urine stream or any dysuria. He reports his urges have been so strong that he is experiencing occasional incontinence.  He is not sure whether his symptoms related to start of new psychiatric medications.   Review of Systems Negative for significant weight change. Denies hx of elevated blood sugars.  Past Medical History  Diagnosis Date  . Bipolar disorder     Has psychiatrist.  BH admission (suicidal) 07/20/11.  Marland Kitchen Hyperlipemia     Intol of meds: GI side effects  . 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  No hx of MRSA infection.  . 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 Depression   . Nephrolithiasis   . GERD (gastroesophageal reflux disease)   . Inguinal hernia 02/25/11    Left sided hernia with fat  . Hip pain, left 2011-2012    Left hip and groin: MRI pelvis and left hip 02/18/11 showed NORMAL hip, with small inguinal hernia containing fat and sigmoid diverticulosis.  Hip pain presumably referred pain from hernia/diverticular dz??.  . Cervical spondylosis     Primarily C4-5 and C5-6--referred to [facility] 02/2011.    History   Social History  . Marital Status: Married    Spouse Name: N/A    Number of Children: N/A    . Years of Education: N/A   Occupational History  . Not on file.   Social History Main Topics  . Smoking status: Never Smoker   . Smokeless tobacco: Not on file  . Alcohol Use: No  . Drug Use: No  . Sexually Active: Yes   Other Topics Concern  . Not on file   Social History Narrative   Married, lives in [place].  Retired Quarry manager.Two daughters, both married, 2 grandchildren.No regular exercise.  Never smoker.  No ETOH.  No drugs.    Past Surgical History  Procedure Date  . 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 Ankle fracture surgery     hardware still in both ankles (titanium)  . Inguinal hernia repair     Right side with mesh about 1993  . Elbow surgery     left---tendon  . Ankle surgery     Ankle tendon surgery    Family History  Problem Relation Age of Onset  . Alcohol abuse Mother   . Alcohol abuse Father   . Depression Daughter   . Paranoid behavior Daughter     Allergies  Allergen Reactions  . Penicillins Hives    Current Outpatient Prescriptions on File Prior to Visit  Medication Sig Dispense Refill  . albuterol (VENTOLIN HFA) 108 (90 BASE) MCG/ACT inhaler Inhale 2 puffs into the lungs every 4 (four) hours as needed for wheezing.  1 Inhaler  0  . azithromycin (ZITHROMAX) 250 MG tablet 2 tabs po qd x 1d, then 1 tab po qd x4d  6 each  0  . buPROPion (WELLBUTRIN SR) 150 MG 12 hr tablet Take 1 tablet (150 mg total) by mouth daily.  30 tablet  0  . carbamazepine (TEGRETOL) 200 MG tablet Take 1 tablet (200 mg total) by mouth 3 (three) times daily.  90 tablet  2  . clonazePAM (KLONOPIN) 1 MG tablet Take 1 tablet (1 mg total) by mouth 3 (three) times daily.  90 tablet  0  . gabapentin (NEURONTIN) 300 MG capsule Take 2 capsules (600 mg total) by mouth 3 (three) times daily.  90 capsule  0  . nabumetone (RELAFEN) 500 MG tablet Take 1 tablet (500 mg total) by mouth 2 (two) times daily in the am and at bedtime..  30 tablet  0  . omeprazole  (PRILOSEC) 40 MG capsule Take 1 capsule (40 mg total) by mouth once.  30 capsule  0  . oxybutynin (DITROPAN XL) 5 MG 24 hr tablet 1-2 tabs po qAM  30 tablet  2  . predniSONE (DELTASONE) 20 MG tablet 2 tabs po qd x 5d  10 tablet  0  . predniSONE (DELTASONE) 20 MG tablet 1 tab po qd x 5d, then 1/2 tab po qd x 6d, then stop  8 tablet  0  . traZODone (DESYREL) 100 MG tablet TAKE ONE TABLET BY MOUTH AT BEDTIME  90 tablet  0  . venlafaxine (EFFEXOR-XR) 150 MG 24 hr capsule Take 1 capsule (150 mg total) by mouth 2 (two) times daily at 8am and 2pm.  60 capsule  1  . ziprasidone (GEODON) 60 MG capsule Take 1 capsule (60 mg total) by mouth 2 (two) times daily with a meal.  60 capsule  1    BP 132/70  Pulse 76  Temp(Src) 98.1 F (36.7 C) (Oral)  Ht 5\' 10"  (1.778 m)  Wt 258 lb (117.028 kg)  BMI 37.02 kg/m2       Objective:   Physical Exam  Constitutional: He is oriented to person, place, and time. He appears well-developed and well-nourished.  HENT:  Head: Normocephalic and atraumatic.  Right Ear: External ear normal.  Left Ear: External ear normal.  Mouth/Throat: Oropharynx is clear and moist.  Eyes: Conjunctivae are normal. Pupils are equal, round, and reactive to light.  Neck: Neck supple.       No carotid bruit  Cardiovascular: Normal rate, regular rhythm and normal heart sounds.   Pulmonary/Chest: Effort normal and breath sounds normal. He has no wheezes. He has no rales.  Genitourinary: Rectum normal and prostate normal. Guaiac negative stool.  Musculoskeletal: He exhibits no edema.  Neurological: He is alert and oriented to person, place, and time. No cranial nerve deficit.  Skin: Skin is warm and dry.  Psychiatric: He has a normal mood and affect. His behavior is normal.      Assessment & Plan:

## 2011-11-29 LAB — MICROALBUMIN / CREATININE URINE RATIO
Microalb Creat Ratio: 6.3 mg/g (ref 0.0–30.0)
Microalb, Ur: 1.5 mg/dL (ref 0.0–1.9)

## 2011-12-01 ENCOUNTER — Other Ambulatory Visit (HOSPITAL_COMMUNITY): Payer: Self-pay

## 2011-12-02 ENCOUNTER — Encounter (HOSPITAL_COMMUNITY): Payer: Self-pay | Admitting: Psychiatry

## 2011-12-02 ENCOUNTER — Other Ambulatory Visit (HOSPITAL_COMMUNITY): Payer: Self-pay | Admitting: Physician Assistant

## 2011-12-02 ENCOUNTER — Other Ambulatory Visit (INDEPENDENT_AMBULATORY_CARE_PROVIDER_SITE_OTHER): Payer: Self-pay

## 2011-12-02 ENCOUNTER — Ambulatory Visit (HOSPITAL_COMMUNITY): Payer: Self-pay | Admitting: Psychiatry

## 2011-12-02 ENCOUNTER — Ambulatory Visit (INDEPENDENT_AMBULATORY_CARE_PROVIDER_SITE_OTHER): Payer: MEDICARE | Admitting: Psychiatry

## 2011-12-02 DIAGNOSIS — F319 Bipolar disorder, unspecified: Secondary | ICD-10-CM

## 2011-12-02 DIAGNOSIS — R3129 Other microscopic hematuria: Secondary | ICD-10-CM

## 2011-12-02 MED ORDER — GABAPENTIN 300 MG PO CAPS: 600.0000 mg | ORAL_CAPSULE | Freq: Three times a day (TID) | ORAL | Status: DC

## 2011-12-02 NOTE — Progress Notes (Signed)
   THERAPIST PROGRESS NOTE  Session Time: 3:00-3:50 pm  Participation Level: Active  Behavioral Response: NeatAlertAnxious  Type of Therapy: Individual Therapy  Treatment Goals addressed: Diagnosis: Bipolar Disorder  Interventions: Other: Heartmath (biofeedback)  Summary: Sean Hill is a 71 y.o. male who presents with manic behavior and reports feeling manic with racing thoughts and high anxiety. Patient states he was not planning on attending this session, but spoke to his PA, [name] and was informed he needed to attend. Patient is impulsive while manic and makes decisions that are not in the best interest of his care, but he states he listened to Hessie Diener and decided to attend.  When asked what he wanted to do to try to resolve some of his anxiety he stated he would like to use the session to practice the [employer] [facility], biofeedback technique that he used in previous sessions. Patient admitted he had purchased the program, but had not used it since January due to life being too stressful and busy. Patient was connected to the machine and registered high anxiety, but was able to use the "nuetral" technique" and then the "Quick Coherence technique" to regain a sense of calm. Patient reported less racing thoughts and decreased tension in his body following twenty minutes on the program. Patient reported back on the previous session by saying he used the DBT skill of mindfulness four times this past week and found it helpful some in reducing his anger towards others. Patient states he hopes to learn ways to better manage his mood so he can stop taking some of he medications due to being frustrated by the side affects.   Suicidal/Homicidal: Nowithout intent/plan  Therapist Response: Explored ways patient manages his mania and anxiety symptoms. Re-educated patient on the how to use heart math regularly in order for it to be effective to mange stress and to the "nuetral" technique" and the "Quick  Coherence technique". Encouraged him to breathe in the feeling of "calm" and transition away from thoughts as he had them. Assigned patient homework to do five minutes of heartmath daily until his next session next week and then writer down his reaction to it on a log to bring to his next session. Discussed how we will focus on combining DBT skills with the Biofeedback as a way to manage his depression and anxiety.   Plan: Return again in 1 week.  Diagnosis: Axis I: Bipolar, Manic    Axis II: None    Gevin Perea E, LCSW 12/02/2011

## 2011-12-03 LAB — URINALYSIS, MICROSCOPIC ONLY: Squamous Epithelial / LPF: NONE SEEN

## 2011-12-09 ENCOUNTER — Ambulatory Visit (INDEPENDENT_AMBULATORY_CARE_PROVIDER_SITE_OTHER): Payer: MEDICARE | Admitting: Psychiatry

## 2011-12-09 DIAGNOSIS — F3113 Bipolar disorder, current episode manic without psychotic features, severe: Secondary | ICD-10-CM

## 2011-12-09 NOTE — Progress Notes (Signed)
   THERAPIST PROGRESS NOTE  Session Time: 3:00-3:50 pm  Participation Level: Active  Behavioral Response: CasualAlertAnxious  Type of Therapy: Individual Therapy  Treatment Goals addressed: Diagnosis: Bipolar with mania  Interventions: Solution Focused and Biofeedback  Summary: Sean Hill is a 71 y.o. male who presents with manic behavior, impulsivity and limited insight. He states he feels "good" and describes how he was unable to complete his homework assignment from the previous session to do five minutes daily of heartmath for anxiety management, but how he researched how to stop all of his medications by tapering them down so he could be tested for Alzheimers. Patient was persistent in his desire to stop his medications due to being tired of the negative side affects. Patient states he has not informed his wife of his attempt to stop the medications and contacted his PA, but since he had not heard back he decided to proceed on his own. Patient did not appear motivated to explore the biofeedback and wanted to focus the conversation only on medication management despite writers numerous attempts at redirection back to therapy related goals. Patient eventually agreed to sign onto the heartmath program in compliance with his treatment goals and spent five minutes during the session practicing the technique while breathing in the word calm. patient agreed to retry the homework assignment over the course of the next week.   Suicidal/Homicidal: Nowithout intent/plan  Therapist Response: Confronted patient on his manic and impulsive behaviors. Discussed concerns with patient stopping his medications again and revisited consequences from his previous attempt a few months ago that sent him into the hospital. Set limits with patient to agree to involve his wife, PA and therapist before making any of his own medication changes. Educated patient on symptoms of mania and reminded him of therapists  role in doing therapeutic interventions not on m education management. Reintroduced heartmath as patients chosen strategy to try to manage mania symptoms.   Plan: Return again in 2  Weeks. Patient states he can't attend next week due to scheduling conflict. He agrees to do heartmath daily for five minutes immediately following breakfast and will discuss this at the next session.   Diagnosis: Axis I: Bipolar, Manic    Axis II: None    Gurfateh Mcclain E, LCSW 12/09/2011

## 2011-12-10 ENCOUNTER — Other Ambulatory Visit (HOSPITAL_COMMUNITY): Payer: Self-pay | Admitting: Physician Assistant

## 2011-12-12 ENCOUNTER — Telehealth (HOSPITAL_COMMUNITY): Payer: Self-pay | Admitting: Physician Assistant

## 2011-12-12 NOTE — Telephone Encounter (Signed)
Left message on pt's answering machine re: his message that he wanted to be referred to another provider.  He displayed similar behavior the last time he had become manic.  Pt also left a note last week after visiting with Maxcine Ham, that he was changing his Effexor back to the shorter acting version.  I approved of this.

## 2011-12-14 ENCOUNTER — Other Ambulatory Visit (HOSPITAL_COMMUNITY): Payer: Self-pay | Admitting: *Deleted

## 2011-12-19 NOTE — Telephone Encounter (Signed)
Patient is be followed by  Dr Artist Pais

## 2011-12-20 ENCOUNTER — Other Ambulatory Visit (HOSPITAL_COMMUNITY): Payer: Self-pay | Admitting: *Deleted

## 2011-12-22 ENCOUNTER — Other Ambulatory Visit (HOSPITAL_COMMUNITY): Payer: Self-pay | Admitting: Physician Assistant

## 2011-12-22 ENCOUNTER — Other Ambulatory Visit (HOSPITAL_COMMUNITY): Payer: Self-pay

## 2011-12-23 ENCOUNTER — Ambulatory Visit (INDEPENDENT_AMBULATORY_CARE_PROVIDER_SITE_OTHER): Payer: MEDICARE | Admitting: Psychiatry

## 2011-12-23 ENCOUNTER — Encounter (HOSPITAL_COMMUNITY): Payer: Self-pay | Admitting: Psychiatry

## 2011-12-23 DIAGNOSIS — F3112 Bipolar disorder, current episode manic without psychotic features, moderate: Secondary | ICD-10-CM

## 2011-12-23 NOTE — Progress Notes (Signed)
   THERAPIST PROGRESS NOTE  Session Time: 2:00-2:50 pm   Participation Level: Active  Behavioral Response: CasualAlertEuthymic  Type of Therapy: Individual Therapy  Treatment Goals addressed: Diagnosis: Bipolar Disorder  Interventions: Supportive and Reframing  Summary: Sean Hill is a 71 y.o. male who presents with euthymic mood and affect. He does not appear manic or depressed and reports feeling "stable". His speech was normal instead of pressured and his thoughts were organized and goal directed. He described having one of the best weeks that he has had in a long time with regards to elevated mood. He provided an update on his homework of doing heartmath daily and brought in a printout of his latest session which showed high coherence. He states he has been able to be more effective with the technique now that his thoughts are clearer. He described feeling optimistic about his upcoming appointment with [name] on Tuesday at the [facility] as she will be taking over the monitoring of his medications. Patient discussed his decision to leave [name], his PA for the past seven years due to feeling like he was not making as much progress with his mood stability as he would have preferred.   Suicidal/Homicidal: Nowithout intent/plan  Therapist Response: Assessed patients level of functioning for mania and depression, explored how to organize his first visit with his new PA in order to stay focused and ensure his needs are met. Assisted with creating a list of questions to ask at the first session. Provided supportive counseling regarding the progress he feels he has made with mood stability over the past week.   Plan: Return again in 1 week.  Diagnosis: Axis I: Bipolar, mixed    Axis II: None    Srishti Strnad E, LCSW 12/23/2011

## 2011-12-26 ENCOUNTER — Ambulatory Visit (HOSPITAL_COMMUNITY): Payer: Self-pay | Admitting: Physician Assistant

## 2011-12-26 ENCOUNTER — Encounter (HOSPITAL_COMMUNITY): Payer: Self-pay | Admitting: *Deleted

## 2011-12-26 NOTE — Progress Notes (Signed)
Received faxed refill request from [facility] on 12/20/11 for Tegretol 200 mg originally prescribed by Dr. Rutherford Limerick while patient was in IOP. Patient has decided to seek medical services per phone conversation with [name] PA on 12/12/11. Verifiedwith Mr.Watt that he is no longer filling RX for this patient on 4/2 and returned fax to pharmacy with that note on it.  Received duplicate fax on 12/21/11 requesting Tegretol. Contacted pharmacist to inform that patient not currently seen in this office for medication management. The pharmacist acknowledged information and said she would contact patient.

## 2012-01-09 ENCOUNTER — Encounter (HOSPITAL_COMMUNITY): Payer: Self-pay | Admitting: Psychiatry

## 2012-01-09 ENCOUNTER — Ambulatory Visit (INDEPENDENT_AMBULATORY_CARE_PROVIDER_SITE_OTHER): Payer: MEDICARE | Admitting: Psychiatry

## 2012-01-09 DIAGNOSIS — F319 Bipolar disorder, unspecified: Secondary | ICD-10-CM

## 2012-01-09 NOTE — Progress Notes (Signed)
   THERAPIST PROGRESS NOTE  Session Time: 2:00-2:50 pm  Participation Level Active  Behavioral Response: CasualAlertEuthymic  Type of Therapy: Individual Therapy  Treatment Goals addressed: Diagnosis: Bipolar Disorder  Interventions: DBT, Solution Focused and Strength-based  Summary: Sean Hill is a 71 y.o. male who presents with euthymic mood and affect. He reports stable mood consistent for the past month. He describes feeling "good" and "balanced". He states he had a positive experience at the [facility] with Sean Fordyce, PA and described medication changes (see record) that have been made. He states he is holding up with his wife's cancer treatment and reports overall good health and a feeling of "happiness". He was able to identify and list the contributing factors to his stable mood.    Suicidal/Homicidal: Nowithout intent/plan  Therapist Response: Inquired about visit with new PA and medication changes. Explored reasons for stable mood. Gave positive feedback for making behavioral changes using DBT skills. Explored future of treatment due to stable mood.   Plan: Return again in 1 week. Continue weekly sessions for the next three week and then determine a need for reduced sessions if mood remains stable.   Diagnosis: Axis I: Bipolar, mixed    Axis II: None    Sean Whiteaker E, LCSW 01/09/2012

## 2012-01-20 ENCOUNTER — Ambulatory Visit (INDEPENDENT_AMBULATORY_CARE_PROVIDER_SITE_OTHER): Payer: MEDICARE | Admitting: Psychiatry

## 2012-01-20 DIAGNOSIS — F319 Bipolar disorder, unspecified: Secondary | ICD-10-CM

## 2012-01-20 NOTE — Progress Notes (Signed)
   THERAPIST PROGRESS NOTE  Session Time: 2:00-2:50 pm  Participation Level: Active  Behavioral Response: CasualAlertEuthymic  Type of Therapy: Individual Therapy  Treatment Goals addressed: Diagnosis: Bipolar Disorder  Interventions: DBT, Solution Focused and Strength-based  Summary: Sean Hill is a 71 y.o. male who presents with euthymic mood and affect. This is the longest patient reports and demonstrates stable mood without having any signs of manic or depressive behavior. He demonstrates calm mannerisms and clear, goal oriented thought process with good insight. He states he continues to work closely with [name], PA, to manage his mental health medications and using the DBT skill of "[name]" to manage anger and impulsivity. He states this skill has been life changing for him and he is able to "stop" and think before responding to his anger. He states he is managing his wife's cancer treatment and also thinks he is being strong for her and taking more responsibility over his wellness due to her sickness. He is aware that this will be his challenge when she heals from treatment to continue taking this personal responsibility. He states he would like to return in two weeks and if mood continues to remain stable, move sessions out to monthly.   Suicidal/Homicidal: Nowithout intent/plan  Therapist Response: Reviewed emotionally stability and ways he is maintaining it, reviewed the DBT skill of "Wise Mind" and how he continues to use it for anger management. Explored ways to identify if depression or mania is returning. Discussed reducing therapy session frequency due to continued progress. Educated patient on the opportunity to attend a DBT class and gain further DBT skills, which patient is considering.   Plan: Return again in 2 weeks. Begin monthly sessions if mood remains stable.   Diagnosis: Axis I: Bipolar, mixed    Axis II: None    Yuette Putnam E,  LCSW 01/20/2012

## 2012-01-26 ENCOUNTER — Ambulatory Visit (HOSPITAL_COMMUNITY): Payer: Self-pay | Admitting: Psychiatry

## 2012-02-03 ENCOUNTER — Ambulatory Visit (INDEPENDENT_AMBULATORY_CARE_PROVIDER_SITE_OTHER): Payer: Medicare Other | Admitting: Psychiatry

## 2012-02-03 DIAGNOSIS — F3163 Bipolar disorder, current episode mixed, severe, without psychotic features: Secondary | ICD-10-CM

## 2012-02-06 NOTE — Progress Notes (Signed)
   THERAPIST PROGRESS NOTE  Session Time: 3:00-3:50 pm  Participation Level: Active  Behavioral Response: CasualAlertEuthymic  Type of Therapy: Individual Therapy  Treatment Goals addressed: Diagnosis: Bipolar Disorder  Interventions: DBT and Strength-based  Summary: Sean Hill is a 71 y.o. male who presents with euthymic mood and affect and in stable condition as he has for several weeks consistently. His mood appears balanced and stable, thoughts logical and coherent and has insight into his mood. He states he feels good and balanced and described how he is taking his medication as prescribed and intervening and challenging the depression and mania at the earliest sign of any shifts in mood. He states he has been using the DBT skill of [name] and it has been helping tremendously to manage anger and mood shifts. He states he would like to learn more how to be "more present in the moment" and described how he tends to struggle paying attention to just one thing. He states he would like to learn a technique to help him more with this and agreed to use the next session to practice the skill of Mindfulness. He states he is paying more attention to his physical health and going to doctors appointments, which he states would have scared him before he was well and states he is no longer suicidal or wanting to die. He described wanting to do whatever is necessary to maintain a long and healthy life with his wife. He gave a report on her cancer treatment and states if he continues to remain stable after her next treatment, he wants to begin monthly therapy sessions.   Suicidal/Homicidal: Nowithout intent/plan  Therapist Response: Reviewed progress in treatment and ways patient is maintaining stability, explored coping strategies and how he is managing his wife's cancer treatments. Discussed beginning monthly sessions after next session.   Plan: Return again in 2 weeks. Teach Mindfulness skill  to practice staying in the moment and then explore monthly sessions.   Diagnosis: Axis I: Bipolar, mixed    Axis II: None    [name], LCSW 02/06/2012

## 2012-02-14 ENCOUNTER — Other Ambulatory Visit (INDEPENDENT_AMBULATORY_CARE_PROVIDER_SITE_OTHER): Payer: Medicare Other

## 2012-02-14 DIAGNOSIS — F319 Bipolar disorder, unspecified: Secondary | ICD-10-CM

## 2012-02-14 DIAGNOSIS — Z79899 Other long term (current) drug therapy: Secondary | ICD-10-CM

## 2012-02-14 DIAGNOSIS — R3915 Urgency of urination: Secondary | ICD-10-CM

## 2012-02-14 DIAGNOSIS — Z0000 Encounter for general adult medical examination without abnormal findings: Secondary | ICD-10-CM

## 2012-02-14 DIAGNOSIS — M26629 Arthralgia of temporomandibular joint, unspecified side: Secondary | ICD-10-CM

## 2012-02-14 DIAGNOSIS — Z136 Encounter for screening for cardiovascular disorders: Secondary | ICD-10-CM

## 2012-02-14 LAB — CBC WITH DIFFERENTIAL/PLATELET
Basophils Absolute: 0 10*3/uL (ref 0.0–0.1)
Eosinophils Relative: 1.2 % (ref 0.0–5.0)
HCT: 48.6 % (ref 39.0–52.0)
Lymphs Abs: 1.9 10*3/uL (ref 0.7–4.0)
MCV: 88.5 fl (ref 78.0–100.0)
Monocytes Absolute: 0.6 10*3/uL (ref 0.1–1.0)
Platelets: 239 10*3/uL (ref 150.0–400.0)
RDW: 14.5 % (ref 11.5–14.6)

## 2012-02-14 LAB — BASIC METABOLIC PANEL
BUN: 12 mg/dL (ref 6–23)
Chloride: 105 mEq/L (ref 96–112)
Glucose, Bld: 103 mg/dL — ABNORMAL HIGH (ref 70–99)
Potassium: 4.4 mEq/L (ref 3.5–5.1)

## 2012-02-14 LAB — LIPID PANEL
Cholesterol: 234 mg/dL — ABNORMAL HIGH (ref 0–200)
Total CHOL/HDL Ratio: 6
Triglycerides: 453 mg/dL — ABNORMAL HIGH (ref 0.0–149.0)

## 2012-02-14 LAB — HEPATIC FUNCTION PANEL
Albumin: 3.8 g/dL (ref 3.5–5.2)
Bilirubin, Direct: 0.1 mg/dL (ref 0.0–0.3)
Total Protein: 6.6 g/dL (ref 6.0–8.3)

## 2012-02-16 LAB — POCT URINALYSIS DIPSTICK
Bilirubin, UA: NEGATIVE
Ketones, UA: NEGATIVE
pH, UA: 7

## 2012-02-21 ENCOUNTER — Ambulatory Visit (INDEPENDENT_AMBULATORY_CARE_PROVIDER_SITE_OTHER): Payer: Medicare Other | Admitting: Internal Medicine

## 2012-02-21 ENCOUNTER — Encounter: Payer: Self-pay | Admitting: Internal Medicine

## 2012-02-21 VITALS — BP 148/86 | HR 92 | Temp 98.1°F | Ht 68.5 in | Wt 259.0 lb

## 2012-02-21 DIAGNOSIS — R079 Chest pain, unspecified: Secondary | ICD-10-CM

## 2012-02-21 DIAGNOSIS — Z0000 Encounter for general adult medical examination without abnormal findings: Secondary | ICD-10-CM

## 2012-02-21 DIAGNOSIS — K219 Gastro-esophageal reflux disease without esophagitis: Secondary | ICD-10-CM

## 2012-02-21 MED ORDER — PANTOPRAZOLE SODIUM 40 MG PO TBEC: 40.0000 mg | DELAYED_RELEASE_TABLET | Freq: Two times a day (BID) | ORAL | Status: DC

## 2012-02-21 NOTE — Progress Notes (Signed)
Subjective:    Patient ID: Sean Hill, male    DOB: [birth year], 71 y.o.   MRN: [id number]  HPI  71 year old white male with history of bipolar disorder and gastroesophageal reflux disease for routine Medicare physical. At previous visit patient was switched to Zegerid for refractory reflux symptoms. It did not make significant improvement and it is somewhat cost prohibitive. Patient requests to be put back on protonic 40 mg twice daily.  Interval history - he was taken off Geodon.  His weight is stable.  He was seen by urologists and underwent cystoscopy. There were no bladder tumors but question benign tumor of kidney. His symptoms presumed secondary to prostatitis and he has been finishing course of Cipro for the last 30 days with improvement in his symptoms.  Medicare annual on his physical questionnaire reviewed in detail. See attached form.  Health maintenance items reviewed. Patient has never had a colonoscopy. He declines referral for colonoscopy due to financial reasons. He also declines Zostavax for financial reasons.   Review of Systems   Constitutional: Negative for activity change, appetite change and unexpected weight change.  Eyes: Negative for visual disturbance.  Respiratory: Negative for cough, chest tightness and shortness of breath.   Cardiovascular: positive  for chest wall pain (right side).  Genitourinary: Negative for difficulty urinating.  Neurological: Negative for headaches.  Gastrointestinal: Negative for abdominal pain, heartburn melena or hematochezia Psych: Negative for depression      Past Medical History  Diagnosis Date  . Bipolar disorder     Has psychiatrist.  BH admission (suicidal) 07/20/11.  Marland Kitchen Hyperlipemia     Intol of meds: GI side effects  . 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  No hx of MRSA infection.  . 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 Depression   . Nephrolithiasis     . GERD (gastroesophageal reflux disease)   . Inguinal hernia 02/25/11    Left sided hernia with fat  . Hip pain, left 2011-2012    Left hip and groin: MRI pelvis and left hip 02/18/11 showed NORMAL hip, with small inguinal hernia containing fat and sigmoid diverticulosis.  Hip pain presumably referred pain from hernia/diverticular dz??.  . Cervical spondylosis     Primarily C4-5 and C5-6--referred to [facility] 02/2011.    History   Social History  . Marital Status: Married    Spouse Name: N/A    Number of Children: N/A  . Years of Education: N/A   Occupational History  . Not on file.   Social History Main Topics  . Smoking status: Never Smoker   . Smokeless tobacco: Not on file  . Alcohol Use: No  . Drug Use: No  . Sexually Active: Yes   Other Topics Concern  . Not on file   Social History Narrative   Married, lives in [place].  Retired Quarry manager.Two daughters, both married, 2 grandchildren.No regular exercise.  Never smoker.  No ETOH.  No drugs.    Past Surgical History  Procedure Date  . 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 Ankle fracture surgery     hardware still in both ankles (titanium)  . Inguinal hernia repair     Right side with mesh about 1993  . Elbow surgery     left---tendon  . Ankle surgery     Ankle tendon surgery    Family History  Problem Relation Age of Onset  . Alcohol abuse Mother   . Alcohol abuse Father   . Depression Daughter   . Paranoid behavior Daughter     Allergies  Allergen Reactions  . Penicillins Hives    Current Outpatient Prescriptions on File Prior to Visit  Medication Sig Dispense Refill  . buPROPion (WELLBUTRIN SR) 150 MG 12 hr tablet TAKE ONE TABLET BY MOUTH ONE TIME DAILY  30 tablet  0  . clonazePAM (KLONOPIN) 1 MG tablet Take 1 tablet (1 mg total) by mouth 3 (three) times daily.  90 tablet  0  . gabapentin (NEURONTIN) 300 MG capsule Take 2 capsules (600 mg total)  by mouth 3 (three) times daily.  180 capsule  0  . LamoTRIgine (LAMICTAL STARTER PO) Take 5 mg by mouth daily.      . traZODone (DESYREL) 100 MG tablet TAKE ONE TABLET BY MOUTH AT BEDTIME  90 tablet  0  . DISCONTD: buPROPion (WELLBUTRIN SR) 150 MG 12 hr tablet Take 1 tablet (150 mg total) by mouth daily.  30 tablet  0  . DISCONTD: omeprazole (PRILOSEC) 40 MG capsule Take 1 capsule (40 mg total) by mouth once.  30 capsule  0  . DISCONTD: omeprazole-sodium bicarbonate (ZEGERID) 40-1100 MG per capsule Take 1 capsule by mouth daily before breakfast.  90 capsule  1  . DISCONTD: venlafaxine (EFFEXOR-XR) 150 MG 24 hr capsule Take 1 capsule (150 mg total) by mouth 2 (two) times daily at 8am and 2pm.  60 capsule  1    BP 148/86  Pulse 92  Temp(Src) 98.1 F (36.7 C) (Oral)  Ht 5' 8.5" (1.74 m)  Wt 259 lb (117.482 kg)  BMI 38.81 kg/m2   EKG reviewed    Objective:   Physical Exam  Constitutional: He is oriented to person, place, and time. He appears well-developed and well-nourished.  HENT:  Head: Normocephalic and atraumatic.  Right Ear: External ear normal.  Left Ear: External ear normal.  Mouth/Throat: Oropharynx is clear and moist.  Neck: Normal range of motion. Neck supple.  Cardiovascular: Normal rate, regular rhythm and normal heart sounds.   No murmur heard. Pulmonary/Chest: Effort normal and breath sounds normal. He has no wheezes. He has no rales.  Abdominal: Soft. He exhibits no mass. There is no tenderness.       Abdominal obesity  Musculoskeletal: He exhibits no edema.  Lymphadenopathy:    He has no cervical adenopathy.  Neurological: He is alert and oriented to person, place, and time. No cranial nerve deficit.  Skin: Skin is warm and dry.  Psychiatric: He has a normal mood and affect. His behavior is normal.          Assessment & Plan:

## 2012-02-21 NOTE — Patient Instructions (Addendum)
Please start weight loss program and start regular exercise program (Goal weight loss - approximately 25 lbs within next 6 months) Monitor your blood pressure at home.  Call our office if your readings are consistently greater than 140/80

## 2012-02-21 NOTE — Assessment & Plan Note (Signed)
Medicare annual bonus physical questionnaire reviewed in detail. Nutrition referral recommended the patient declined. Arrange IFOB.  He declines colonoscopy due to financial reasons. Mother adult vaccines up to date. Lipid panel reviewed in detail. Regular exercise and dietary changes discussed.

## 2012-02-21 NOTE — Assessment & Plan Note (Signed)
No improvement with Zegerid. Resume protonic 40 mg twice a day. Continue antireflux diet. Weight loss encouraged

## 2012-03-01 ENCOUNTER — Ambulatory Visit (INDEPENDENT_AMBULATORY_CARE_PROVIDER_SITE_OTHER): Payer: Medicare Other | Admitting: Psychiatry

## 2012-03-01 DIAGNOSIS — F319 Bipolar disorder, unspecified: Secondary | ICD-10-CM

## 2012-03-07 NOTE — Progress Notes (Signed)
   THERAPIST PROGRESS NOTE  Session Time: 3:00-3:50 pm  Participation Level: Active  Behavioral Response: CasualAlertEuthymic  Type of Therapy: Individual Therapy  Treatment Goals addressed: Diagnosis: Bipolar Disorder  Interventions: DBT, Strength-based and Supportive  Summary: Sean Hill is a 71 y.o. male who presents with euthymic mood and affect. He continues to report stable mood and expresses an ability to manage symptoms of mania and depression as they come by working his daily [employer] of activities. He provided an update on his wife's cancer treatment and how he is coping with her symptoms and chemo treatments. He expressed an interest near the end of the session to want to get off more of his mental health medications, as he has done in the past on his own and not under the supervision of a doctor. He agreed not to make any changes without consulting his PA. He also expressed an interest in monthly sessions due to his mood stability.   Suicidal/Homicidal: Nowithout intent/plan  Therapist Response: Reviewed patients progress and assessed for mood stability. Challenged his desire to begin changing his medications without consulting his doctor. Reminded him this is a symptom of his impulsive manic behavior. Agreed to monthly sessions to monitor wellness.   Plan: Return again in 4 weeks.  Diagnosis: Axis I: Bipolar, mixed    Axis II: None    Danaye Sobh E, LCSW 03/07/2012

## 2012-03-13 ENCOUNTER — Other Ambulatory Visit: Payer: Medicare Other

## 2012-03-13 LAB — FECAL OCCULT BLOOD, IMMUNOCHEMICAL: Fecal Occult Bld: NEGATIVE

## 2012-03-15 NOTE — Progress Notes (Signed)
Pt informed

## 2012-03-28 ENCOUNTER — Encounter: Payer: Self-pay | Admitting: Internal Medicine

## 2012-03-28 ENCOUNTER — Ambulatory Visit (INDEPENDENT_AMBULATORY_CARE_PROVIDER_SITE_OTHER): Payer: Medicare Other | Admitting: Internal Medicine

## 2012-03-28 VITALS — BP 130/80 | Temp 98.0°F | Wt 257.0 lb

## 2012-03-28 DIAGNOSIS — L84 Corns and callosities: Secondary | ICD-10-CM

## 2012-03-28 DIAGNOSIS — N529 Male erectile dysfunction, unspecified: Secondary | ICD-10-CM

## 2012-03-28 DIAGNOSIS — L0390 Cellulitis, unspecified: Secondary | ICD-10-CM | POA: Insufficient documentation

## 2012-03-28 DIAGNOSIS — L0291 Cutaneous abscess, unspecified: Secondary | ICD-10-CM

## 2012-03-28 MED ORDER — SILDENAFIL CITRATE 100 MG PO TABS: ORAL_TABLET | ORAL | Status: DC

## 2012-03-28 MED ORDER — OMEPRAZOLE 40 MG PO CPDR: 40.0000 mg | DELAYED_RELEASE_CAPSULE | Freq: Two times a day (BID) | ORAL | Status: DC

## 2012-03-28 MED ORDER — CEPHALEXIN 500 MG PO CAPS: 1000.0000 mg | ORAL_CAPSULE | Freq: Two times a day (BID) | ORAL | Status: AC

## 2012-03-28 NOTE — Progress Notes (Signed)
Subjective:    Patient ID: Sean Hill, male    DOB: 06/11/1941, 71 y.o.   MRN: [id number]  HPI  71 year old white male complains of redness and tenderness of his right great toe.  He has has history of callus on top of base of right toe. Skin  cracked and then turned red and tender.  He denies fever chills. He has a prosthetic right knee. He is worried about cellulitis and sepsis.  Review of Systems Negative for fever or chills  Past Medical History  Diagnosis Date  . Bipolar disorder     Has psychiatrist.  BH admission (suicidal) 07/20/11.  Marland Kitchen Hyperlipemia     Intol of meds: GI side effects  . 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  No hx of MRSA infection.  . 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 Depression   . Nephrolithiasis   . GERD (gastroesophageal reflux disease)   . Inguinal hernia 02/25/11    Left sided hernia with fat  . Hip pain, left 2011-2012    Left hip and groin: MRI pelvis and left hip 02/18/11 showed NORMAL hip, with small inguinal hernia containing fat and sigmoid diverticulosis.  Hip pain presumably referred pain from hernia/diverticular dz??.  . Cervical spondylosis     Primarily C4-5 and C5-6--referred to [facility] 02/2011.    History   Social History  . Marital Status: Married    Spouse Name: N/A    Number of Children: N/A  . Years of Education: N/A   Occupational History  . Not on file.   Social History Main Topics  . Smoking status: Never Smoker   . Smokeless tobacco: Not on file  . Alcohol Use: No  . Drug Use: No  . Sexually Active: Yes   Other Topics Concern  . Not on file   Social History Narrative   Married, lives in [place].  Retired Quarry manager.Two daughters, both married, 2 grandchildren.No regular exercise.  Never smoker.  No ETOH.  No drugs.    Past Surgical History  Procedure Date  . Total knee arthroplasty     Right and left (titanium)    . Ankle fracture surgery     hardware still in both ankles (titanium)  . Inguinal hernia repair     Right side with mesh about 1993  . Elbow surgery     left---tendon  . Ankle surgery     Ankle tendon surgery    Family History  Problem Relation Age of Onset  . Alcohol abuse Mother   . Alcohol abuse Father   . Depression Daughter   . Paranoid behavior Daughter     Allergies  Allergen Reactions  . Penicillins Hives    Current Outpatient Prescriptions on File Prior to Visit  Medication Sig Dispense Refill  . buPROPion (WELLBUTRIN SR) 150 MG 12 hr tablet TAKE ONE TABLET BY MOUTH ONE TIME DAILY  30 tablet  0  . clonazePAM (KLONOPIN) 1 MG tablet Take 1 tablet (1 mg total) by mouth 3 (three) times daily.  90 tablet  0  . gabapentin (NEURONTIN) 300 MG capsule Take 2 capsules (600 mg total) by mouth 3 (three) times daily.  180 capsule  0  . LamoTRIgine (LAMICTAL STARTER PO) Take 5 mg by mouth daily.      . traZODone (DESYREL) 100 MG tablet TAKE ONE TABLET BY MOUTH AT BEDTIME  90 tablet  0  .  omeprazole (PRILOSEC) 40 MG capsule Take 1 capsule (40 mg total) by mouth 2 (two) times daily.  60 capsule  5  . sildenafil (VIAGRA) 100 MG tablet 50-100 mg daily as needed for erectile dysfunction  3 tablet  0  . DISCONTD: venlafaxine (EFFEXOR-XR) 150 MG 24 hr capsule Take 1 capsule (150 mg total) by mouth 2 (two) times daily at 8am and 2pm.  60 capsule  1    BP 130/80  Temp 98 F (36.7 C) (Oral)  Wt 257 lb (116.574 kg)       Objective:   Physical Exam  Constitutional: He appears well-developed and well-nourished.  Cardiovascular: Normal rate, regular rhythm and normal heart sounds.   Pulmonary/Chest: Effort normal and breath sounds normal. He has no wheezes.  Skin:       2-3 cm callus at base of right great toe.  Skin has 2 small cracks and surrounding erythema       Assessment & Plan:

## 2012-03-28 NOTE — Assessment & Plan Note (Addendum)
71 year old white male with early cellulitis of right great toe. Treat with Keflex 1000 mg twice a day for 7 days. Keep area covered. 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 Refer to podiatrist.  He will likely require special shoes.  Patient advised to call office if symptoms persist or worsen.

## 2012-04-06 ENCOUNTER — Ambulatory Visit (INDEPENDENT_AMBULATORY_CARE_PROVIDER_SITE_OTHER): Payer: Medicare Other | Admitting: Psychiatry

## 2012-04-06 NOTE — Progress Notes (Unsigned)
   THERAPIST PROGRESS NOTE  Session Time: 2:00-2:50 pm  Participation Level: Active  Behavioral Response: CasualAlertAnxious and Depressed  Type of Therapy: Individual Therapy  Treatment Goals addressed: Anxiety and Diagnosis: Bipolar, manic  Interventions: CBT and Solution Focused  Summary: Sean Hill is a 71 y.o. male who presents with feelings of anxiety and mainia. He reports an increase in depressive symptoms since his last visit that did not get to a level of suicidal thinking and he was able to manage but states his mood has shifted to a manic state. He is aware of this because he has felt an increase in agitation, lack of patience, increase in desire to act impulsively and has found himself driving faster than the speed limit. He began the session by talking about not being able to afford his medications and having a desire to make changes. He is aware that this is symptom that usually accompanies his mania and was receptive to exploring this. He agreed to look into a program to assist with paying for his medications and remain on the ones he was prescribed. He identified his depression trigger as his wife's final cancer screen coming back as inconclusive to the cancer being completely gone. He processed feelings of anger and sadness with the outcome of the test. Patient described taking up free weight lifting as a way to help get more physical activity to help manage his mood. He continues to take an active role in trying to mange symptoms and had good insight into his current emotional state.   Suicidal/Homicidal: Nowithout intent/plan  Therapist Response: Provided supportive counseling and reflective listening regarding the update on his wife's cancer treatment. Explored symptoms of mania and how patient is maintaining his mood. Challenged impulsive thoughts and behaviors Discussed a need to increase sessions to weekly until his mood returns to stable.   Plan: Return again in  1 week. Monitory mania and depressive symptoms. Review ability to afford medications. Call patient with a medicare assistance program to help with financial costs. Provide supportive counseling regarding his wife's cancer treatment.   Diagnosis: Axis I: Bipolar, Manic    Axis II: None    Sean Donlon E, LCSW 04/06/2012

## 2012-04-12 ENCOUNTER — Encounter (HOSPITAL_COMMUNITY): Payer: Self-pay | Admitting: Psychiatry

## 2012-04-12 ENCOUNTER — Ambulatory Visit (INDEPENDENT_AMBULATORY_CARE_PROVIDER_SITE_OTHER): Payer: Medicare Other | Admitting: Psychiatry

## 2012-04-12 DIAGNOSIS — F3160 Bipolar disorder, current episode mixed, unspecified: Secondary | ICD-10-CM

## 2012-04-12 NOTE — Progress Notes (Signed)
   THERAPIST PROGRESS NOTE  Session Time: 3:00-3:50 pm  Participation Level: Active  Behavioral Response: Casual, Neat and Well GroomedAlertAnxious and Depressed  Type of Therapy: Individual Therapy  Treatment Goals addressed: Anxiety and Diagnosis: Bipolar Disorder,mixed  Interventions: CBT and Solution Focused  Summary: Sean Hill is a 71 y.o. male who presents with depressed and manic mood and affect. He reports an increase in depressive and manic symptoms and identified the triggers as those discussed in the previous session (learning about his wife's cancer update) and being on a steroid for his knee inflammation. Patient states every time he takes a steroid, it throws his mood off. He states he has two days left on this medication and is going to contact his doctor to discuss options for getting off it or switching medications. He also aggress to inform his mental health PA next time his doctor recommends a steroid to discuss possible side affects prior to filling the medication. He denies suicidal thoughts and agrees to enact the understood safety plan if this symptom occurs. He is still logical with his thought process and is not showing signs of impulsivity.  He processed feelings of disappointment with the return of his symptoms and states he gave his wife the key to the gun cabinet as a preventative measure despite no current intension's of self harm.   Suicidal/Homicidal: Nowithout intent/plan  Therapist Response: used strength based therapy to identify the personal responsibility patient is using over his illness and praise his efforts. Created an updated safety plan to manage symptoms and what to do if symptoms worsen. Educated patient on ways to collaborate between mental health and medical professions to reduce future relapse.   Plan: Return again in 1 week. Assess symptom severity and safety. Monitor for self harm. Explore results of ending the steroids.    Diagnosis: Axis I: Bipolar, mixed    Axis II: None    Sean Hill E, LCSW 04/12/2012

## 2012-04-23 ENCOUNTER — Encounter (HOSPITAL_COMMUNITY): Payer: Self-pay | Admitting: Psychiatry

## 2012-04-23 ENCOUNTER — Ambulatory Visit (INDEPENDENT_AMBULATORY_CARE_PROVIDER_SITE_OTHER): Payer: Medicare Other | Admitting: Psychiatry

## 2012-04-23 DIAGNOSIS — F319 Bipolar disorder, unspecified: Secondary | ICD-10-CM

## 2012-04-23 NOTE — Progress Notes (Signed)
   THERAPIST PROGRESS NOTE  Session Time: 2:00-2:50 pm  Participation Level: Active  Behavioral Response: Casual, Neat and Well GroomedAlertAnxious  Type of Therapy: Individual Therapy  Treatment Goals addressed: Anxiety  Interventions: Strength-based and Meditation: Progressive Muscle Relaxation  Summary: Sean Hill is a 71 y.o. male who presents with anxious mood and affect. Patient reports continued anxiety but less manic mood and behavior without any depressive symptoms. He states he is holding tension in his body and grinding his teeth to the point of cracking a filling that will need dental repair. He provided an update on how he is managing his wife's cancer recovery and states he had a medication evaluation this past week where they agreed to increase his Lamictal to help reduce the manic symptoms and he feels some improvement with this change. He states the feelings of anxiety are his main stressor and agreed to use the session to practice a relaxation technique called progressive muscle relaxation. He rated his anxiety high at the start of the exercise and following the exercise he noted a significant reduction in his tension and anxiety. He agreed to practice this technique daily until his next session.   Suicidal/Homicidal: Nowithout intent/plan  Therapist Response: Assess overall level of function and gathered information regarding medication changes. Discussed anxiety symptoms and introduced patient to a meditation technique for stress reduction called progressive muscle relaxation. Informed patient how he could find free versions of meditation exercises to practice at home.   Plan: Return again in 2 weeks. Re-assess levels of anxiety and mania. Revisit homework to use relaxation to manage anxiety.   Diagnosis: Axis I: Bipolar, Manic    Axis II: None    Sherrian Nunnelley E, LCSW 04/23/2012

## 2012-05-03 ENCOUNTER — Ambulatory Visit (HOSPITAL_COMMUNITY): Payer: Self-pay | Admitting: Psychiatry

## 2012-05-14 ENCOUNTER — Ambulatory Visit (INDEPENDENT_AMBULATORY_CARE_PROVIDER_SITE_OTHER): Payer: Medicare Other | Admitting: Psychiatry

## 2012-05-14 ENCOUNTER — Encounter (HOSPITAL_COMMUNITY): Payer: Self-pay | Admitting: Psychiatry

## 2012-05-14 DIAGNOSIS — F314 Bipolar disorder, current episode depressed, severe, without psychotic features: Secondary | ICD-10-CM

## 2012-05-14 NOTE — Progress Notes (Signed)
   THERAPIST PROGRESS NOTE  Session Time: 3:00-3:50 pm  Participation Level: Active  Behavioral Response: CasualAlertDepressed  Type of Therapy: Individual Therapy  Treatment Goals addressed: Coping  Interventions: Solution Focused, Supportive and Other: Education on Bipolar Disorder  Summary: Sean Hill is a 72 y.o. male who presents with depressed mood and affect. He states his wife had to drive him to the appointment due to being on new medications from his PA for depression and feeling "drunk" as his body adjusts to the increased doses. He has stayed in contact over the phone with writer this week and with his doctor, [name], to discuss and manage his depression symptoms. He has maintained good insight throughout this depression relapse. He reports having active thought of suicide two days ago, but enacted his safety plan and kept himself safe. He states he no longer has the thoughts of suicide but states he has eight of the nine depression symptoms. He describes some improvement in mood from yesterday. He had some negative thinking, as is common with his depression states. He began talking about "only having ten more years to live" and wondering about his medical health. He was able to see this negative thinking as a symptom of his depression.  He states he has been using his coping skills to manage symptoms and listed them. He questioned if he would have to live with mood shifts for the rest of his life and appeared discouraged. He continues being engaged in his wellness and was able to see how he is an active participant with this relapse as he is learning how to manage the symptoms.     Suicidal/Homicidal: Nowithout intent/plan  Therapist Response: Assessed level of depression, risk for safety. Monitored symptoms, discussed how patient is taking a more active role in his wellness, reviewed progress and made an action plan if symptoms return or worsen.   Plan: Return again  in 1 weeks. Monitor patient for safety. Continue teaching coping skills to maintain mood stability.   Diagnosis: Axis I: Bipolar, Depressed    Axis II: None    Granvil Djordjevic E, LCSW 05/14/2012

## 2012-05-18 ENCOUNTER — Ambulatory Visit (HOSPITAL_COMMUNITY): Payer: Self-pay | Admitting: Psychiatry

## 2012-05-23 ENCOUNTER — Ambulatory Visit (HOSPITAL_COMMUNITY): Payer: Self-pay | Admitting: Psychiatry

## 2012-05-30 ENCOUNTER — Encounter (HOSPITAL_COMMUNITY): Payer: Self-pay | Admitting: Psychiatry

## 2012-05-30 ENCOUNTER — Ambulatory Visit (INDEPENDENT_AMBULATORY_CARE_PROVIDER_SITE_OTHER): Payer: Medicare Other | Admitting: Psychiatry

## 2012-05-30 DIAGNOSIS — F3112 Bipolar disorder, current episode manic without psychotic features, moderate: Secondary | ICD-10-CM

## 2012-05-30 NOTE — Progress Notes (Signed)
   THERAPIST PROGRESS NOTE  Session Time: 2:00-2:50 pm  Participation Level: Active  Behavioral Response: Casual and NeatAlertAnxious and Irritable  Type of Therapy: Individual Therapy  Treatment Goals addressed: Anxiety and Diagnosis: Bipolar, manic  Interventions: Meditation: Mindfulness  Summary: Sean Hill is a 72 y.o. male who presents with anxious mood and affect and reports manic symptoms. He has racing thoughts and accelerated speech. He states he is aware he is manic and can identify the symptoms. He describes meting with his doctor and making some medication adjustments he is happy with. His goal for the session was to reduce the anxiety.  He participated in a [employer] and reported less anxiety afterwards. He agreed to do this daily until his next session. He also identified some of his anxiety triggers that increase his agitation and agreed to try to avoid them to reduce anger outbursts until the next session. He reported less anxiety at the end of the session.    Suicidal/Homicidal: Nowithout intent/plan  Therapist Response: Introduced patient to a mindfulness meditation and assigned it for daily homework. Explored triggers to agitation and a plan to avoid them.   Plan: Return again in 2 weeks.  Diagnosis: Axis I: Bipolar, Manic    Axis II: None    Blanton Kardell E, LCSW 05/30/2012

## 2012-06-27 ENCOUNTER — Ambulatory Visit (INDEPENDENT_AMBULATORY_CARE_PROVIDER_SITE_OTHER): Payer: Medicare Other | Admitting: Psychiatry

## 2012-06-27 ENCOUNTER — Encounter (HOSPITAL_COMMUNITY): Payer: Self-pay | Admitting: Psychiatry

## 2012-06-27 ENCOUNTER — Ambulatory Visit (HOSPITAL_COMMUNITY)
Admission: RE | Admit: 2012-06-27 | Discharge: 2012-06-27 | Disposition: A | Discharge status: Home / Self Care | Payer: No Typology Code available for payment source | Attending: Psychiatry | Admitting: Psychiatry

## 2012-06-27 DIAGNOSIS — F314 Bipolar disorder, current episode depressed, severe, without psychotic features: Secondary | ICD-10-CM

## 2012-06-27 DIAGNOSIS — F332 Major depressive disorder, recurrent severe without psychotic features: Secondary | ICD-10-CM | POA: Insufficient documentation

## 2012-06-27 NOTE — Progress Notes (Signed)
   THERAPIST PROGRESS NOTE  Session Time: 2:00-2:50 pm  Participation Level: Active  Behavioral Response: CasualAlertDepressed  Type of Therapy: Individual Therapy  Treatment Goals addressed: Coping  Interventions: Other: Crisis Stabilization  Summary: Sean Hill is a 71 y.o. male who presents with severe depressed mood and affect. Entire session was used for safety assessment and crisis planning. Patient reports high depression with thoughts of suicide this past week but denied current thoughts. He reported his depression at a 7/8 out of 10 but contracted for safety and agreed to take himself to the inpatient unit if thoughts returned. Sean Hill, Case manager witnessed the verbal contracting. Patient created a daily wellness list of activities to resume to combat the depression symptoms and agreed to begin the IOP group therapy program tomorrow.   Suicidal/Homicidal: Nowithout intent/plan  Therapist Response: Assessed patient for suicidality, referred patient to the assessment department for admission into the IOP program.   Plan: Begin the IOP group therapy program tomorrow.   Diagnosis: Axis I: Bipolar, Depressed    Axis II: None    Sean Mccrystal E, LCSW 06/27/2012

## 2012-06-27 NOTE — BH Assessment (Signed)
Assessment Note   Sean Hill is an 71 y.o. male, married, white who was referred for admission assessment for Psychiatric IOP by his current outpatient therapist, Maxcine Ham. Pt was accompanied by his wife who was present during assessment at the Pt's request. Pt reports he has been depressed for years but his depressive symptoms have been worse for the past month. His depressive symptoms including decreased energy, decreased sleep, not wanting to leave the house and feelings of sadness and hopelessness. He reports that he has been experiencing memory problems, both short-term and long-term memory. He repeatedly denies any current or recent suicidal ideation but Pt reports a history of 4 previous suicide attempts, including overdose, threatening to shoot himself with a gun and cutting his wrist while on a inpatient psychiatric unit on suicide precautions. Pt's wife does not feel Pt is at risk for suicide at this time and says she has no concerns with him going home today. Wife and Pt also confirm Pt does not have access to guns. Pt denies homicidal ideations or a history of violence. He denies psychotic symptoms. He denies alcohol or substance abuse. He denies any medical problems other than GERD.  Pt could not identify any stressors. He reports that his psychiatric Nurse Practioner, [name], recently changed his Trazodone, which he has been taking for years, to Ambien and that he cannot sleep well with Ambien. He also reports she prescribed Adderall but he has not filled the prescription due to his sleep problems. His wife reports she was recently treated for cancer and this has been stressful for the Pt. Pt's wife reports they have two adult daughters, both of whom have mental health problems. Pt reports both his parents were alcoholic and his wife says he has "horror stories" of growing up in an alcoholic household.   Axis I: 296.33 Major Depressive Disorder, Recurrent, Severe Without  Psychotic Features Axis II: Deferred Axis III:  Past Medical History  Diagnosis Date  . Bipolar disorder     Has psychiatrist.  BH admission (suicidal) 07/20/11.  Marland Kitchen Hyperlipemia     Intol of meds: GI side effects  . 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  No hx of MRSA infection.  . 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 Depression   . Nephrolithiasis   . GERD (gastroesophageal reflux disease)   . Inguinal hernia 02/25/11    Left sided hernia with fat  . Hip pain, left 2011-2012    Left hip and groin: MRI pelvis and left hip 02/18/11 showed NORMAL hip, with small inguinal hernia containing fat and sigmoid diverticulosis.  Hip pain presumably referred pain from hernia/diverticular dz??.  . Cervical spondylosis     Primarily C4-5 and C5-6--referred to [facility] 02/2011.   Axis IV: Spouse recovering from cancer Axis V: 41-50 serious symptoms  Past Medical History:  Past Medical History  Diagnosis Date  . Bipolar disorder     Has psychiatrist.  BH admission (suicidal) 07/20/11.  Marland Kitchen Hyperlipemia     Intol of meds: GI side effects  . 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  No hx of MRSA infection.  . 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 Depression   . Nephrolithiasis   . GERD (gastroesophageal reflux disease)   . Inguinal hernia 02/25/11    Left sided hernia with fat  .  Hip pain, left 2011-2012    Left hip and groin: MRI pelvis and left hip 02/18/11 showed NORMAL hip, with small inguinal hernia containing fat and sigmoid diverticulosis.  Hip pain presumably referred pain from hernia/diverticular dz??.  . Cervical spondylosis     Primarily C4-5 and C5-6--referred to [facility] 02/2011.    Past Surgical History  Procedure Date  . 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 Ankle fracture surgery     hardware  still in both ankles (titanium)  . Inguinal hernia repair     Right side with mesh about 1993  . Elbow surgery     left---tendon  . Ankle surgery     Ankle tendon surgery    Family History:  Family History  Problem Relation Age of Onset  . Alcohol abuse Mother   . Alcohol abuse Father   . Depression Daughter   . Paranoid behavior Daughter     Social History:  reports that he has never smoked. He does not have any smokeless tobacco history on file. He reports that he does not drink alcohol or use illicit drugs.  Additional Social History:  Alcohol / Drug Use Pain Medications: Denies Prescriptions: Denies Over the Counter: Denies History of alcohol / drug use?: No history of alcohol / drug abuse Longest period of sobriety (when/how long): NA  CIWA:   COWS:    Allergies:  Allergies  Allergen Reactions  . Penicillins Hives    Home Medications:  (Not in a hospital admission)  OB/GYN Status:  No LMP for male patient.  General Assessment Data Location of Assessment: [facility] Assessment Services Living Arrangements: Spouse/significant other Can pt return to current living arrangement?: Yes Admission Status: Voluntary Is patient capable of signing voluntary admission?: Yes Transfer from: Other (Comment) [facility]) Referral Source: Other (Therapist: Maxcine Ham)  Education Status Is patient currently in school?: No  Risk to self Suicidal Ideation: No Suicidal Intent: No Is patient at risk for suicide?: Yes (Pt denies any recent SI but has a history of 4 previous atte) Suicidal Plan?: No Access to Means: No What has been your use of drugs/alcohol within the last 12 months?: Pt denies use of drugs and alcohol Previous Attempts/Gestures: Yes How many times?: 4  Other Self Harm Risks: Pt denies Triggers for Past Attempts: Other (Comment) (Severe depressive symptoms) Intentional Self Injurious Behavior: None Family Suicide History: Yes (Nephew  committed suicide in 2012) Recent stressful life event(s): Other (Comment) (Wife recently was treated for cancer) Persecutory voices/beliefs?: No Depression: Yes Depression Symptoms: Despondent;Isolating;Fatigue;Guilt;Loss of interest in usual pleasures;Feeling worthless/self pity Substance abuse history and/or treatment for substance abuse?: No Suicide prevention information given to non-admitted patients: Yes (Pt and wife received suicide prevention information)  Risk to Others Homicidal Ideation: No Thoughts of Harm to Others: No Current Homicidal Intent: No Current Homicidal Plan: No Access to Homicidal Means: No Identified Victim: None History of harm to others?: No Assessment of Violence: None Noted Violent Behavior Description: Pt denies history of violence Does patient have access to weapons?: No (Wife says Pt does not have access to guns) Criminal Charges Pending?: No Does patient have a court date: No  Psychosis Hallucinations: None noted Delusions: None noted  Mental Status Report Appear/Hygiene: Other (Comment) (Casually dressed) Eye Contact: Good Motor Activity: Unremarkable Speech: Logical/coherent Level of Consciousness: Alert Mood: Depressed Affect: Depressed Anxiety Level: None Thought Processes: Coherent;Relevant Judgement: Unimpaired Orientation: Person;Place;Time;Situation Obsessive Compulsive Thoughts/Behaviors: Minimal  Cognitive Functioning Concentration: Decreased Memory: Recent Intact;Remote Intact (Pt reports episodic memory problems) IQ: Average Insight: Good Impulse Control: Good Appetite: Fair Weight Loss: 0  Weight Gain: 0  Sleep: Decreased Total Hours of Sleep: 3  Vegetative Symptoms: None  ADLScreening [facility] Assessment Services) Patient's cognitive ability adequate to safely complete daily activities?: Yes Patient able to express need for assistance with ADLs?: Yes Independently performs ADLs?: Yes (appropriate for developmental  age)  Abuse/Neglect [facility]) Physical Abuse: Denies Verbal Abuse: Yes, past (Comment) (Pt reports he grew up in an alcoholic household) Sexual Abuse: Denies  Prior Inpatient Therapy Prior Inpatient Therapy: Yes Prior Therapy Dates: 06/2011 Prior Therapy Facilty/Provider(s): [facility] Reason for Treatment: Suicidal ideation, depression  Prior Outpatient Therapy Prior Outpatient Therapy: Yes Prior Therapy Dates: Current Prior Therapy Facilty/Provider(s): [name] [name] Reason for Treatment: Depression  ADL Screening (condition at time of admission) Patient's cognitive ability adequate to safely complete daily activities?: Yes Patient able to express need for assistance with ADLs?: Yes Independently performs ADLs?: Yes (appropriate for developmental age) Weakness of Legs: None Weakness of Arms/Hands: None  Home Assistive Devices/Equipment Home Assistive Devices/Equipment: None    Abuse/Neglect Assessment (Assessment to be complete while patient is alone) Physical Abuse: Denies Verbal Abuse: Yes, past (Comment) (Pt reports he grew up in an alcoholic household) Sexual Abuse: Denies Exploitation of patient/patient's resources: Denies Self-Neglect: Denies     Merchant navy officer (For Healthcare) Advance Directive: Patient does not have advance directive;Not applicable, patient <33 years old Pre-existing out of facility DNR order (yellow form or pink MOST form): No Nutrition Screen- [facility] Adult/WL/AP Patient's home diet: Regular Have you recently lost weight without trying?: No Have you been eating poorly because of a decreased appetite?: No Malnutrition Screening Tool Score: 0   Additional Information 1:1 In Past 12 Months?: Yes (Pt attempted to cut his wrist while on inpatient unit) CIRT Risk: No Elopement Risk: No Does patient have medical clearance?: No     Disposition:  Disposition Disposition of Patient: Outpatient treatment Type of outpatient  treatment: Psych Intensive Outpatient  On Site Evaluation by:   Reviewed with Physician: Nelly Rout, MD   Pt contracted for safety and signed No Harm Contract. Both Pt and wife were given suicide prevention information and wife says she will be with Pt and has no concerns regarding his safety. Pt signed Consent For Release of Information and [name] and Maxcine Ham were contacted by voicemail notifying that Pt is agreeable to start Psych IOP on 06/28/2012. Contacted [name], [name] to complete Review of Systems. After waiting 45 minutes for examination Pt refused to wait any longer, declined examination and left with his wife.   Patsy Baltimore, Harlin Rain 06/27/2012 4:49 PM

## 2012-06-28 ENCOUNTER — Other Ambulatory Visit (HOSPITAL_COMMUNITY): Payer: No Typology Code available for payment source | Attending: Psychiatry

## 2012-06-28 ENCOUNTER — Encounter (HOSPITAL_COMMUNITY): Payer: Self-pay

## 2012-06-28 DIAGNOSIS — E785 Hyperlipidemia, unspecified: Secondary | ICD-10-CM | POA: Insufficient documentation

## 2012-06-28 DIAGNOSIS — K219 Gastro-esophageal reflux disease without esophagitis: Secondary | ICD-10-CM | POA: Insufficient documentation

## 2012-06-28 DIAGNOSIS — M47812 Spondylosis without myelopathy or radiculopathy, cervical region: Secondary | ICD-10-CM | POA: Insufficient documentation

## 2012-06-28 DIAGNOSIS — F411 Generalized anxiety disorder: Secondary | ICD-10-CM | POA: Insufficient documentation

## 2012-06-28 DIAGNOSIS — M25559 Pain in unspecified hip: Secondary | ICD-10-CM | POA: Insufficient documentation

## 2012-06-28 DIAGNOSIS — Z79899 Other long term (current) drug therapy: Secondary | ICD-10-CM | POA: Insufficient documentation

## 2012-06-28 DIAGNOSIS — F319 Bipolar disorder, unspecified: Secondary | ICD-10-CM

## 2012-06-28 DIAGNOSIS — F3132 Bipolar disorder, current episode depressed, moderate: Secondary | ICD-10-CM | POA: Insufficient documentation

## 2012-06-28 MED ORDER — TRAZODONE HCL 100 MG PO TABS: 50.0000 mg | ORAL_TABLET | Freq: Every day | ORAL | Status: DC

## 2012-06-28 MED ORDER — LAMOTRIGINE 100 MG PO TABS: 200.0000 mg | ORAL_TABLET | Freq: Two times a day (BID) | ORAL | Status: DC

## 2012-06-28 MED ORDER — GABAPENTIN 300 MG PO CAPS: 300.0000 mg | ORAL_CAPSULE | Freq: Three times a day (TID) | ORAL | Status: DC
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
## 2012-06-28 NOTE — Progress Notes (Signed)
Psychiatric Assessment Adult  Patient Identification:  Sean Hill Date of Evaluation:  06/28/2012 Chief Complaint: Depression and anxiety History of Chief Complaint:  71 year old white male well known to me from a previous admission was referred by his therapist Irven Shelling on because of increasing depression dysphoria and feelings of hopelessness. Patient has been undergoing multiple stressors since the beginning of this year. His wife was diagnosed with cancer in January 2 013, his father-in-law who he was close to died in [id number]. Patient and his wife are trying to downsize and it's been very overwhelming for him. Patient's  nurse practitioner is [name]  who he states has been changing his meds every time he goes in to see he, patient feels over medicated. States that she wanted to start him on Adderall but he did not feel comfortable. Last week his trazodone was discontinued and he was started on Ambien which she took for one night and stated that it did not help him so he returned back to taking trazodone. Patient states that due to all the medication changes he has developed a dry mouth and constantly has to drink water. He has also developed a tremor. Patient feels isolated overwhelmed and has been withdrawing from his social life. Chief Complaint  Patient presents with  . Depression    HPI Review of Systems Physical Exam  Depressive Symptoms: depressed mood, anhedonia, insomnia, psychomotor retardation, fatigue, feelings of worthlessness/guilt, difficulty concentrating, hopelessness, impaired memory, anxiety, insomnia, loss of energy/fatigue, weight gain, increased appetite,  (Hypo) Manic Symptoms:  None Anxiety Symptoms: Excessive Worry:  Yes Panic Symptoms:  No Agoraphobia:  No Obsessive Compulsive: No  Symptoms: None, Specific Phobias:  Yes Social Anxiety:  Yes  Psychotic Symptoms:  Hallucinations: No None Delusions:  No Paranoia:  No     Ideas of Reference:  No  PTSD Symptoms: None   Traumatic Brain Injury: No   Past Psychiatric History: Diagnosis: Bipolar disorder.   Hospitalizations: To inpatient hospitalizations at [facility]. Last one was in November 2 012. [facility]   Outpatient Care: Sees [name] nurse practitioner for medications and sees [place] in the [facility] at [facility] outpatient in [place]. In the past patient has seen Dr. Lolly Mustache and Hessie Diener watt  Substance Abuse Care:   Self-Mutilation:   Suicidal Attempts: 2 attempts for which she was hospitalized   Violent Behaviors:    Past Medical History:   Past Medical History  Diagnosis Date  . Bipolar disorder     Has psychiatrist.  BH admission (suicidal) 07/20/11.  Marland Kitchen Hyperlipemia     Intol of meds: GI side effects  . 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  No hx of MRSA infection.  . 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 Depression   . Nephrolithiasis   . GERD (gastroesophageal reflux disease)   . Inguinal hernia 02/25/11    Left sided hernia with fat  . Hip pain, left 2011-2012    Left hip and groin: MRI pelvis and left hip 02/18/11 showed NORMAL hip, with small inguinal hernia containing fat and sigmoid diverticulosis.  Hip pain presumably referred pain from hernia/diverticular dz??.  . Cervical spondylosis     Primarily C4-5 and C5-6--referred to [facility] 02/2011.   History of Loss of Consciousness:  No Seizure History:  No Cardiac History:  No Allergies:   Allergies  Allergen Reactions  .  Penicillins Hives   Current Medications:  Current Outpatient Prescriptions  Medication Sig Dispense Refill  . buPROPion (WELLBUTRIN SR) 150 MG 12 hr tablet TAKE ONE TABLET BY MOUTH ONE TIME DAILY  30 tablet  0  . clonazePAM (KLONOPIN) 1 MG tablet Take 1 mg by mouth at bedtime as needed.      . gabapentin (NEURONTIN) 300 MG capsule Take 1 capsule (300  mg total) by mouth 3 (three) times daily.  180 capsule  0  . omeprazole (PRILOSEC) 40 MG capsule Take 1 capsule (40 mg total) by mouth 2 (two) times daily.  60 capsule  5  . traZODone (DESYREL) 100 MG tablet Take 0.5 tablets (50 mg total) by mouth at bedtime.  90 tablet  0  . DISCONTD: clonazePAM (KLONOPIN) 1 MG tablet Take 1 tablet (1 mg total) by mouth 3 (three) times daily.  90 tablet  0  . DISCONTD: gabapentin (NEURONTIN) 300 MG capsule Take 2 capsules (600 mg total) by mouth 3 (three) times daily.  180 capsule  0  . DISCONTD: traZODone (DESYREL) 100 MG tablet TAKE ONE TABLET BY MOUTH AT BEDTIME  90 tablet  0  . lamoTRIgine (LAMICTAL) 100 MG tablet Take 2 tablets (200 mg total) by mouth 2 (two) times daily.  30 tablet  2  . sildenafil (VIAGRA) 100 MG tablet 50-100 mg daily as needed for erectile dysfunction  3 tablet  0  . DISCONTD: venlafaxine (EFFEXOR-XR) 150 MG 24 hr capsule Take 1 capsule (150 mg total) by mouth 2 (two) times daily at 8am and 2pm.  60 capsule  1    Previous Psychotropic Medications:  Medication Dose   Effexor, Geodon, Klonopin, Wellbutrin SR, Tegretol,   unknown                      Substance Abuse History in the last 12 months: Not applicable Substance Age of 1st Use Last Use Amount Specific Type  Nicotine      Alcohol      Cannabis      Opiates      Cocaine      Methamphetamines      LSD      Ecstasy      Benzodiazepines      Caffeine      Inhalants      Others:                          Medical Consequences of Substance Abuse:   Legal Consequences of Substance Abuse:   Family Consequences of Substance Abuse:   Blackouts:  No DT's:  No Withdrawal Symptoms:  No None  Social History: Current Place of Residence:  Place of Birth:  Family Members:  Marital Status:  Married Children: 2  Sons: 0  Daughters: 2 Relationships:  Education:  HS Print production planner Problems/Performance:  Religious Beliefs/Practices:  History of Abuse:  emotional (parents were alcoholic) Occupational Experiences; Hotel manager History:  None. Legal History:  Hobbies/Interests:   Family History:   Family History  Problem Relation Age of Onset  . Alcohol abuse Mother   . Alcohol abuse Father   . Depression Daughter   . Paranoid behavior Daughter     Mental Status Examination/Evaluation: Objective:  Appearance: Fairly Groomed obese male with multiple tattoos on his arms   Eye Contact::  Fair  Speech:  Normal Rate and Slow  Volume:  Decreased  Mood:  Depressed and anxious   Affect:  Constricted, Depressed and Restricted  Thought Process:  Circumstantial  Orientation:  Full  Thought Content:  Obsessions  Suicidal Thoughts:  No  Homicidal Thoughts:  No  Judgement:  Fair  Insight:  Fair  Psychomotor Activity:  Decreased  Akathisia:  No  Handed:  Right  AIMS (if indicated):    Assets:  Communication Skills Desire for Improvement Resilience Social Support    Laboratory/X-Ray Psychological Evaluation(s)        Assessment:  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, Depressed  AXIS I Anxiety Disorder NOS and Bipolar, Depressed  AXIS II Cluster C Traits  AXIS III Past Medical History  Diagnosis Date  . Bipolar disorder     Has psychiatrist.  BH admission (suicidal) 07/20/11.  Marland Kitchen Hyperlipemia     Intol of meds: GI side effects  . 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  No hx of MRSA infection.  . 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 Depression   . Nephrolithiasis   . GERD (gastroesophageal reflux disease)   . Inguinal hernia 02/25/11    Left sided hernia with fat  . Hip pain, left 2011-2012    Left hip and groin: MRI pelvis and left hip 02/18/11 showed NORMAL hip, with small inguinal hernia containing fat and sigmoid diverticulosis.  Hip pain presumably referred pain from hernia/diverticular dz??.  . Cervical spondylosis     Primarily C4-5 and C5-6--referred to [facility] 02/2011.     AXIS IV other psychosocial or environmental problems, problems related to social environment and problems with primary support group  AXIS V 51-60 moderate symptoms   Treatment Plan/Recommendations:  Plan of Care: Begin IOP   Laboratory:  None at this time  Psychotherapy: Group and individual therapy   Medications: Discussed decreasing Neurontin 300 mg 3 times a day, and trazodone 50 mg by mouth q. at bedtime. Patient will continue his other medications at the current doses. Discussed sleep hygiene and patient stated understanding.   Routine PRN Medications:  Yes  Consultations:   Safety Concerns:    Other:     Margit Banda [facility] 10/10/201312:23 PM

## 2012-06-29 ENCOUNTER — Other Ambulatory Visit (HOSPITAL_COMMUNITY): Payer: No Typology Code available for payment source

## 2012-06-29 NOTE — Progress Notes (Signed)
    Daily Group Progress Note  Program: IOP  Group Time: 9:00-10:30 am   Participation Level: None  Behavioral Response: None  Type of Therapy:  Process Group  Summary of Progress: Patient was not present for this portion of the group. Writer spoke to the patient on the phone prior to the start of the group with [name], Case Manager, present on the phone call due to patient stating he wanted to quit the group and that he did not plan to return. Patient appears to be lacking insight and judgment due to severe depression and was informed that if he did not come to the group it could result in consultation with the treatment team and psychiatrist to explore if inpatient hospitalization would need to be implemented due to the severity of depression symptoms and lack of insight. Patient was going against medical advice without consulting his individual therapist Hydrographic surveyor) by quitting the group. He agreed to come for the second portion of the group and arrived at 10:30 am.       Group Time: 10:30 am - 12:00 pm   Participation Level:  Minimal  Behavioral Response: Appropriate  Type of Therapy: Psycho-education Group  Summary of Progress: Patient expressed his desire to quit the group and how he only attended due to writer telling him he had to attend or it could result in inpatient hospitalization. He is questioning his psychiatrist and starting to make excuses for not attending the program due to financial stressors, which are two pattern symptoms of when his depression is returning. He agreed to attend one more group day on Monday to determine if he plans to continue in the group.   Maxcine Ham, MSW, LCSW

## 2012-07-02 ENCOUNTER — Other Ambulatory Visit (HOSPITAL_COMMUNITY): Payer: No Typology Code available for payment source

## 2012-07-02 NOTE — Progress Notes (Signed)
    Daily Group Progress Note  Program: IOP  Group Time: 9:00-10:30 am   Participation Level: None  Behavioral Response: Passive-Aggressive and Resistant  Type of Therapy:  Process Group  Summary of Progress: Patient appeared anxious and agitated and only participated when called upon. His thoughts were negative and he talked about not wanting to be in the group but how his wife made him attend. He said his depression is very high and he tapped his leg nervously. He said a family friends son committed suicide this weekend which made his wife more concerned about his own depression. He said he is struggling to relate to anyone in the group and does not feel like anyone is like him. He is being challenged to reframe negative thinking and consider ways the group could help him.      Group Time: 10:30 am - 12:00 pm   Participation Level:  Active  Behavioral Response: Appropriate  Type of Therapy: Psycho-education Group  Summary of Progress: Patient participated in a group with the focus on grief and loss and identifying ways to cope with painful losses.   Maxcine Ham, MSW, LCSW

## 2012-07-03 ENCOUNTER — Other Ambulatory Visit (HOSPITAL_COMMUNITY): Payer: No Typology Code available for payment source

## 2012-07-03 NOTE — Progress Notes (Signed)
    Daily Group Progress Note  Program: IOP  Group Time: 9:00-10:30 am   Participation Level: Active  Behavioral Response: Appropriate  Type of Therapy:  Process Group  Summary of Progress: Patient presented with agitated mood and affect. He reported feeling "annoyed" and was focused on the negatives during the discussion such as "what won't work, and how this group is not helping him". However, he talked more today and is starting to connect with others and trust the process of sharing. He wanted to end the group last Friday and he continues to attend. He shared how he feels the depression is shifting in to manic behavior and how he fears becoming impulsive with his anger, but contracts for safety and denies any thoughts to harm self or others. He agreed to return tomorrow. He also admitted that when he is manic, he begins making medication changes without consulting his doctor.      Group Time: 10:30 am - 12:00 pm   Participation Level:  Active  Behavioral Response: Appropriate  Type of Therapy: Psycho-education Group  Summary of Progress: Patient was introduced to the DBT skill of "Distress Tolerance" and identified unhealthy coping skills used to manage painful emotions and began learning about healthy skills to manage them.  Maxcine Ham, MSW, LCSW

## 2012-07-04 ENCOUNTER — Other Ambulatory Visit (HOSPITAL_COMMUNITY): Payer: No Typology Code available for payment source

## 2012-07-04 ENCOUNTER — Ambulatory Visit (HOSPITAL_COMMUNITY): Payer: Self-pay | Admitting: Psychiatry

## 2012-07-04 NOTE — Progress Notes (Signed)
    Daily Group Progress Note  Program: IOP  Group Time: 9:00-10:30 am   Participation Level: Active  Behavioral Response: Appropriate  Type of Therapy:  Process Group  Summary of Progress: Patient reports feeling "manic" today. He apologized for how negative he was about the presenter on Monday and is exploring how his impulsive comments could have a negative impact on relationships he has with others. He also identified that he still has not grieved his father-in-law's death and is unsure how to proceed with this process. He is getting more comfortable in the group setting and sharing more.      Group Time: 10:30 am - 12:00 pm   Participation Level:  Active  Behavioral Response: Appropriate  Type of Therapy: Psycho-education Group  Summary of Progress: Patient continued learning DBT skills of Distress Tolerance and focused on ACCEPTS, and self-soothing to manage painful feelings.   Maxcine Ham, MSW, LCSW

## 2012-07-05 ENCOUNTER — Other Ambulatory Visit (HOSPITAL_COMMUNITY): Payer: No Typology Code available for payment source

## 2012-07-05 NOTE — Progress Notes (Signed)
    Daily Group Progress Note  Program: IOP  Group Time: 9:00-10:30 am   Participation Level: Active  Behavioral Response: Appropriate  Type of Therapy:  Process Group  Summary of Progress: Patient brought food to share with writer and the group. Patient states he feels "manic" and tapped his leg nervously at different moments during the group. He is opening up more to the group and being less defensive. He connected with others who shared difficult relationship experiences and shared that his marriage of forty four years to his wife has had up's and down's. He is practicing expressing emotions with others and talking through feelings instead of isolating and using negative coping skills.      Group Time: 10:30 am - 12:00 pm   Participation Level:  Active  Behavioral Response: Appropriate  Type of Therapy: Psycho-education Group  Summary of Progress: Patient was educated on support groups through the [employer] that they can access to provide additional support to manage depression and anxiety symptoms.   Maxcine Ham, MSW, LCSW

## 2012-07-06 ENCOUNTER — Other Ambulatory Visit (HOSPITAL_COMMUNITY): Payer: No Typology Code available for payment source

## 2012-07-06 NOTE — Progress Notes (Signed)
    Daily Group Progress Note  Program: IOP  Group Time: 9:00-10:30 am   Participation Level: Active  Behavioral Response: Appropriate  Type of Therapy:  Process Group  Summary of Progress: Patient expressed and presented with high agitation. He was preoccupied with how he feels his medications for Bipolar Disorder are not working and was stuck in a negative thought pattern where he struggled to see situations from a neutral perspective. He expressed anger over having mental illness and received support from others who feel the same. He was able to use his words to release feelings instead of holding them in and as he shared he looked less angry and more engaged.      Group Time: 10:30 am - 12:00 pm   Participation Level:  Active  Behavioral Response: Appropriate  Type of Therapy: Psycho-education Group  Summary of Progress:  Patient was introduced to a stress management tool to symptoms of anxiety (biofeedback). Patient was educated on stress and how to regulate the body to create an internal feeling of calm.   Maxcine Ham, MSW, LCSW

## 2012-07-09 ENCOUNTER — Other Ambulatory Visit (HOSPITAL_COMMUNITY): Payer: No Typology Code available for payment source

## 2012-07-09 NOTE — Progress Notes (Signed)
    Daily Group Progress Note  Program: IOP  Group Time: 9:00-10:30 am   Participation Level: Active  Behavioral Response: Appropriate  Type of Therapy:  Process Group  Summary of Progress: Patient reports feeling "manic" today and appears anxious. He states it is a good feeling in comparison to the depression but he struggled to connect with other members and hear what they were saying because he was not able to fully listen to them. He practiced skills to allow him to try to be present with the group.      Group Time: 10:30 am - 12:00 pm   Participation Level:  Active  Behavioral Response: Appropriate  Type of Therapy: Grief and Loss with Nadine Counts [place]  Summary of Progress: Patient participated in a group with a focus in identifying current losses and identifying appropriate ways to grieve.   Maxcine Ham, MSW, LCSW

## 2012-07-10 ENCOUNTER — Other Ambulatory Visit (HOSPITAL_COMMUNITY): Payer: No Typology Code available for payment source

## 2012-07-10 NOTE — Progress Notes (Signed)
    Daily Group Progress Note  Program: IOP  Group Time: 9:00-10:30 am   Participation Level: Active  Behavioral Response: Appropriate  Type of Therapy:  Process Group  Summary of Progress: Patient is struggling with "being compliant" with treatment. When he is in a "manic state" he takes control of his treatment which he is demonstrating in the group. He stated he is feeling better and is starting to take personal responsibility over needing to take an active role in his wellness, but is still very quick to point out the negatives when people talk or about he skills he is learning to mange his depression. He states he has racing thoughts and has difficulty doing relaxation but is challenging himself daily to do meditation to calm his anxiety.      Group Time: 10:30 am - 12:00 pm   Participation Level:  Active  Behavioral Response: Resistant  Type of Therapy: Psycho-education Group  Summary of Progress: Patient learned about "The Five Love Languages" by Dr. Santina Evans. He refused to complete the quiz to explore his language and challenged the purpose of the assignment.   Maxcine Ham, MSW, LCSW

## 2012-07-11 ENCOUNTER — Ambulatory Visit (HOSPITAL_COMMUNITY): Payer: Self-pay | Admitting: Psychiatry

## 2012-07-11 ENCOUNTER — Other Ambulatory Visit (HOSPITAL_COMMUNITY): Payer: No Typology Code available for payment source

## 2012-07-11 NOTE — Progress Notes (Signed)
    Daily Group Progress Note  Program: IOP  Group Time: 9:00-10:30 am   Participation Level: Active  Behavioral Response: Appropriate  Type of Therapy:  Process Group  Summary of Progress: Patient continues to present with manic mood and behavior. He is struggling to connect fully to the group and is focused on interacting mainly with Clinical research associate. He is being redirected to form with the group as a whole. He is still annoyed at his illness of Bipolar Disorder and struggling to accept it as a condition. He fears not getting better.     Group Time: 10:30 am - 12:00 pm   Participation Level:  Active  Behavioral Response: Appropriate  Type of Therapy: Psycho-education Group  Summary of Progress: Patient learned the skill of Progressive Muscle Relaxation Meditation and practiced the technique to reduce stress symptoms and discussed how this could part of a regular routine for stress reduction.   Maxcine Ham, MSW, LCSW

## 2012-07-12 ENCOUNTER — Other Ambulatory Visit (HOSPITAL_COMMUNITY): Payer: No Typology Code available for payment source

## 2012-07-12 NOTE — Progress Notes (Unsigned)
    Daily Group Progress Note  Program: IOP  Group Time: 9:00-10:30 am    Participation Level: Active  Behavioral Response: Appropriate  Type of Therapy:  Process Group  Summary of Progress: Patient still reports high depression and anxiety symptoms. His main struggle is negative thinking pertaining to his mental illness. He is upset that he will have Bipolar Disorder for the rest of his life and have to take medications to treat it. His thoughts go quickly to what is not working and what is wrong in his life and he struggles to see the times in his life recently where he was happy.      Group Time: 10:30 am - 12:00 pm   Participation Level:  Active  Behavioral Response: Appropriate  Type of Therapy: Psycho-education Group  Summary of Progress: Patient learned the CBT skill of "Reframing" negative thoughts into neutral thoughts to reduce feelings of anxiety and depression.  Maxcine Ham, MSW, LCSW

## 2012-07-13 ENCOUNTER — Other Ambulatory Visit (HOSPITAL_COMMUNITY): Payer: No Typology Code available for payment source

## 2012-07-13 NOTE — Progress Notes (Unsigned)
    Daily Group Progress Note  Program: IOP  Group Time: 9:00-10:30 am   Participation Level: Active  Behavioral Response: Appropriate  Type of Therapy:  Process Group  Summary of Progress: Patient reports continued anger at his Bipolar illness and with having to take medications daily to treat the illness. He is solidified in his negative thinking and is unable to think rationally. Members challenged him on this, but he appeared to lack insight to hear their comments.      Group Time: 10:30 am - 12:00 pm   Participation Level:  Active  Behavioral Response: Appropriate  Type of Therapy: Psycho-education Group  Summary of Progress: Patient participated in a goodbye ceremony for two members ending the program today and said how they impacted her and wished them well while practicing healthy closure.   Maxcine Ham, MSW, LCSW

## 2012-07-16 ENCOUNTER — Other Ambulatory Visit (HOSPITAL_COMMUNITY): Payer: No Typology Code available for payment source

## 2012-07-16 ENCOUNTER — Telehealth (HOSPITAL_COMMUNITY): Payer: Self-pay | Admitting: Psychiatry

## 2012-07-16 NOTE — Telephone Encounter (Signed)
D:  Patient apparently left [facility] this morning after realizing that the group leader [name] [place], [place]) wasn't going to be leading group today.  Writer placed call to patient.  He stated that he left because someone else was leading group and he didn't get anything out of this particular person leading previously.  A:  Instructed patient to return to [facility] tomorrow so he can be discharged by the doctor and to say good-bye to the group.  R:  Pt receptive.

## 2012-07-17 ENCOUNTER — Other Ambulatory Visit (HOSPITAL_COMMUNITY): Payer: No Typology Code available for payment source

## 2012-07-17 NOTE — Progress Notes (Unsigned)
    Daily Group Progress Note  Program: IOP  Group Time: 9:00-10:30 am    Participation Level: None  Behavioral Response: Resistant  Type of Therapy:  Process Group  Summary of Progress: Today is patients last day in the group. He appeared annoyed that he had to be there to say goodbye and sat quietly and did not participate much. He sates he does not feel any better since starting, but was able to identify that he is impulsive and "acts first and thinks after". He continues to direct his own treatment without always working with his providers. He will return to [facility] for individual therapy to work on symptoms of mania.      Group Time: 10:30 am - 12:00 pm   Participation Level:  Active  Behavioral Response: Appropriate  Type of Therapy: Psycho-education Group  Summary of Progress: Patient participated in a goodbye ceremony for himself and practiced the skill of communication, expressing feelings and having healthy closure.   Maxcine Ham, MSW, LCSW

## 2012-07-17 NOTE — Progress Notes (Signed)
Patient ID: Sean Hill, male   DOB: 1941/07/28, 71 y.o.   MRN: [id number] D:  This is a 71 yr old married caucasian male, who was referred per therapist Maxcine Ham, LCSW), treatment for worsening depressive symptoms with SI.  Currently denies any SI/HI or A/V hallucinations.  Pt reports feeling a little worse since being in [facility]. "My depression and anxiety comes and goes.  Depends on the stress that day." Admits to binge drinking this past weekend.  States he had 10 coolers.  Reports that the groups were good.  C/O the age differences, but was able to learn some tools.  A:  D/C today.  F/U with [name], NP on 07-24-12 and Maxcine Ham, LCSW on 07-25-12.  Encouraged support groups.  R:  Pt receptive.

## 2012-07-17 NOTE — Patient Instructions (Signed)
Patient completed [facility] today.  Follow up with [name], NP on 07-24-12 @ 8:30 a.m and Maxcine Ham, LCSW on 07-25-12 @ 3pm.  Encouraged support groups.

## 2012-07-18 ENCOUNTER — Other Ambulatory Visit (HOSPITAL_COMMUNITY): Payer: No Typology Code available for payment source

## 2012-07-19 ENCOUNTER — Other Ambulatory Visit (HOSPITAL_COMMUNITY): Payer: No Typology Code available for payment source

## 2012-07-22 NOTE — Progress Notes (Signed)
  [facility] Intensive Outpatient Program Discharge Summary  [name] [id number]  Discharge Note  Patient:  Sean Hill is an 71 y.o., male DOB:  Nov 25, 1940  Date of Admission:  06-27-12  Date of Discharge:  07-17-12  Reason for Admission:Depression and anxiety  Hospital Course:Pt was continued on his medications , his trazadone was decresed to 50 mg q hs due to drowsiness but the pt coudnt sleep so it was increase to 100 mg q hs. His Neurontin was decreased to 300 mg tid. He tol this well He did well in group and gradually stabilized. He was coping well and tol his meds well.  Mental Status at Discharge:Alert, O/3, affect-appropriate, mood-good, speech-normal, no suicidal/ homicidal ideation noted. No hallucinations/ delusions.  Recent/remote memory-good, judgement/ insight-good, concentration/ recall-good.  Lab Results: No results found for this or any previous visit (from the past 48 hour(s)).  Current outpatient prescriptions:buPROPion (WELLBUTRIN SR) 150 MG 12 hr tablet, TAKE ONE TABLET BY MOUTH ONE TIME DAILY, Disp: 30 tablet, Rfl: 0;  clonazePAM (KLONOPIN) 1 MG tablet, Take 1 mg by mouth at bedtime as needed., Disp: , Rfl: ;  gabapentin (NEURONTIN) 300 MG capsule, Take 1 capsule (300 mg total) by mouth 3 (three) times daily., Disp: 180 capsule, Rfl: 0 lamoTRIgine (LAMICTAL) 100 MG tablet, Take 2 tablets (200 mg total) by mouth 2 (two) times daily., Disp: 30 tablet, Rfl: 2;  omeprazole (PRILOSEC) 40 MG capsule, Take 1 capsule (40 mg total) by mouth 2 (two) times daily., Disp: 60 capsule, Rfl: 5;  sildenafil (VIAGRA) 100 MG tablet, 50-100 mg daily as needed for erectile dysfunction, Disp: 3 tablet, Rfl: 0 traZODone (DESYREL) 100 MG tablet, Take 0.5 tablets (50 mg total) by mouth at bedtime., Disp: 90 tablet, Rfl: 0;  [DISCONTINUED] venlafaxine (EFFEXOR-XR) 150 MG 24 hr capsule, Take 1 capsule (150 mg total) by mouth 2 (two) times daily at 8am and 2pm., Disp:  60 capsule, Rfl: 1  Axis Diagnosis:  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, Depressed   Level of Care:  OP  Discharge destination:  Home  Is patient on multiple antipsychotic therapies at discharge:  No    Has Patient had three or more failed trials of antipsychotic monotherapy by history:  No  Patient phone:  [phone] (home)  Patient address:   [address] [place] [address],   Follow-up recommendations:  Activity:  as tolerated Diet:  Regular Other:  follow up for med with [name] and Maxcine Ham for therapy  Comments:    The patient received suicide prevention pamphlet:  Yes Belongings returned:    Margit Banda 07/22/2012, 5:41 PM    [facility] 07/22/2012

## 2012-07-25 ENCOUNTER — Ambulatory Visit (INDEPENDENT_AMBULATORY_CARE_PROVIDER_SITE_OTHER): Payer: Medicare Other | Admitting: Psychiatry

## 2012-08-03 ENCOUNTER — Ambulatory Visit (INDEPENDENT_AMBULATORY_CARE_PROVIDER_SITE_OTHER): Payer: Medicare Other | Admitting: Psychiatry

## 2012-08-03 DIAGNOSIS — F319 Bipolar disorder, unspecified: Secondary | ICD-10-CM

## 2012-08-03 DIAGNOSIS — K589 Irritable bowel syndrome without diarrhea: Secondary | ICD-10-CM

## 2012-08-03 NOTE — Progress Notes (Signed)
   THERAPIST PROGRESS NOTE  Session Time: 3:00-3:50 pm  Participation Level: Active  Behavioral Response: CasualAlertAnxious and Depressed  Type of Therapy: Individual Therapy  Treatment Goals addressed: Anxiety and Coping  Interventions: CBT and Supportive  Summary: Sean Hill is a 71 y.o. male who presents with depressed and anxious mood and affect. He reports higher anxiety than depression and appears anxious during the session. He states he is doing one hour of meditation daily and it is helping. He states his sleep is poor, about four hours per night and he feels that contributes to his anxiety level. He also states he enjoys being up late at night because that is the only time he has away from his wife, who he loves, but her tendency to micromanage things in the home causes him stress. The nights are his alone time. He states he has had thoughts of suicide this week, but has not had a plan to act on them, He states they were just fleeting thoughts due to feeling like his life does not have any purpose. He lacks activities that is engaged in that Hill in enjoyment. He showed the mood tracking device he found for his I-Pad that was his homework from last week and states he has been using it daily to track his moods and have more insight into his depression and manic episodes. He agrees to continue to use his tracker daily for mood management and he contracted for safety.  Suicidal/Homicidal: Nowithout intent/plan  Therapist Response: Assessed mood and overall level of functioning. Reviewed homework and discussed how to use the application to track mood affectively. Used cbt to explore how to add purposeful activities into daily functioning.   Plan: Return again in 1 weeks. Review new homework assignment of listing interests and strengths to discuss at next session to begin exploring activities that could Hill a sense of purpose.   Diagnosis: Axis I: Bipolar, depressed    Axis  II: No diagnosis    Lorrena Goranson E, LCSW 08/03/2012

## 2012-08-09 ENCOUNTER — Ambulatory Visit (INDEPENDENT_AMBULATORY_CARE_PROVIDER_SITE_OTHER): Payer: Medicare Other | Admitting: Psychiatry

## 2012-08-09 DIAGNOSIS — F319 Bipolar disorder, unspecified: Secondary | ICD-10-CM

## 2012-08-09 NOTE — Progress Notes (Signed)
   THERAPIST PROGRESS NOTE  Session Time: 3:00-3:50 pm  Participation Level: Active  Behavioral Response: CasualAlertAnxious and Depressed  Type of Therapy: Individual Therapy  Treatment Goals addressed: Coping  Interventions: CBT and Solution Focused  Summary: Sean Hill is a 71 y.o. male who presents with depressed and anxious mood and affect. Patient presents in an impulsive manic state with a lack of awareness of it. He immediatly starts the session with "I want to get off all my medications and I am serious this time". An extreme focus on stopping medications and an impulsive tendency to make his own medication changes is a primary symptom of his mania, which was discussed with patient during his last session. He states has has already began tapering down on some of his medications, but contracted with writer to not make any additional changes until next week. He struggled to see why he should involve his PA, [name] in helping taper down because he fears she wont' support this decision due to the severity of his past suicide attempts that occurred while tapering his own medications. He states he continues to do daily meditations for one hour and wants to explore other holistic ways to manage his bipolar symptoms including acupuncture. He contracted a second time before leaving the session that he will not make any medication changes until next weeks counseling session.   Suicidal/Homicidal: Nowithout intent/plan  Therapist Response: Assessed level of depression and mania. Explored active symptoms of mania and used cbt to challenge negative thought distortions and perceptions. Contracted for safety and with medications.   Plan: Return again in 1 weeks. Re-assess depression and mania symptoms. Explore medications and create a follow up plan.   Diagnosis: Axis I: Bipolar Disorder, manic    Axis II: No diagnosis    Carman Ching, LCSW 08/09/2012

## 2012-08-22 ENCOUNTER — Ambulatory Visit (INDEPENDENT_AMBULATORY_CARE_PROVIDER_SITE_OTHER): Payer: Medicare Other | Admitting: Psychiatry

## 2012-09-06 ENCOUNTER — Ambulatory Visit (INDEPENDENT_AMBULATORY_CARE_PROVIDER_SITE_OTHER): Payer: Medicare Other | Admitting: Psychiatry

## 2012-09-06 DIAGNOSIS — F319 Bipolar disorder, unspecified: Secondary | ICD-10-CM

## 2012-09-06 NOTE — Progress Notes (Signed)
   THERAPIST PROGRESS NOTE  Session Time: 2:00-2:50 pm  Participation Level: Active  Behavioral Response: Negative and CasualAlertNegative and Depressed  Type of Therapy: Individual Therapy  Treatment Goals addressed: Coping  Interventions: CBT and Solution Focused  Summary: Sean Hill is a 71 y.o. male who presents with severe depressed mood and anxious affect. Writer contacted patient to be able to come in earlier due to the severity of depression symptoms. Patient still reports severe depression symptoms including thoughts of suicide with a plan to jump off a tall building when he feels he is no longer able to manage his depression. He states that day is not today and contracts for safety. Patient said when his depression gets to the point he can no longer manage it then he will not want to live anymore. He is currently actively seeking treatments including acupuncture that was dicussed in his previous session last week. He has an appointment on December 27 at [facility] in [place] to help him treat his Bipolar symptoms.  Patient stated he he does not have any feelings towards any of his family and has been having conflict with his wife that has been increasing due to her feeling "burdened by patients mental illness:" Patient is struggling to see what he has going well in his life. He contracted for safety and agreed to attend his next therapy appointment and also agreed to come in for more frequent appointment if they become available.  Suicidal/Homicidal: Yeswith intent/plan but patient states the plan is not currently in affect and this is his baseline.  Therapist Response: Assessed patient for safety and explored patients motive for suicide. Discussed other treatments for the treatment of Patients Bipolar Disorder.   Plan: Return again in 1 week. Continue assessing for safety. Consult with the clinical team regarding patient safety.  Diagnosis: Axis I: Bipolar,  Depressed    Axis II: No diagnosis    Edwardo Wojnarowski E, LCSW 09/06/2012

## 2012-09-06 NOTE — Progress Notes (Deleted)
Patient ID: Sean Hill, male   DOB: 09-08-1941, 70 y.o.   MRN: [id number] Patient:   Sean Hill   DOB:   28-May-1941  MR Number:  [id number]  Location:  [facility] [place] [id number] [place] [address] Dept: [phone]           Date of Service:   ***  Start Time:   *** End Time:   ***  Provider/Observer:  Carman Ching LCSW       Billing Code/Service: [phone]  Chief Complaint:    No chief complaint on file.   Reason for Service:  ***  Current Status:  ***  Reliability of Information: ***  Behavioral Observation: Sean Hill  presents as a 71 y.o.-year-old {Handed:22697} {Race/ethnicity:17218} {INFANT GENDER IN OR:22171} who appeared his stated age. his dress was {Desc;appropriate/inappropriate:5787::"Appropriate"} and he was {Appearance:22683} and his manners were {Desc;appropriate/inappropriate:5787::"Appropriate"} to the situation.  There {were/were KVQ:25956} any physical disabilities noted.  he displayed an {Desc; ppropriate/inappropriate:30686::"appropriate"} level of cooperation and motivation.    Interactions:    {BHH PARTICIPATION LOVFI:43329}   Attention:   {Desc; normal/abnormal/low/high:18745}  Memory:   {Desc; normal/abnormal/low/high:18745}  Visuo-spatial:   {Desc; normal/abnormal/low/high:18745}  Speech (Volume):  {desc; low/normal/high/v JJOA:41660}  Speech:   {findings; speech psych:31885}  Thought Process:  {BHH THOUGHT PROCESS:22309}  Though Content:  {BHH THOUGHT CONTENT:22310}  Orientation:   {orientation:30299}  Judgment:   {BHH JUDGMENT:22312}  Planning:   {BHH JUDGMENT:22312}  Affect:    {BHH AFFECT:22266}  Mood:    {BHH MOOD:22306}  Insight:   {Insight (PAA):22695}  Intelligence:   {desc; low/normal/high/v YTKZ:60109}  Marital Status/Living: ***  Current Employment: ***  Past Employment:  ***  Substance  Use:  {Substance abuse:20568}  ***  Education:   {Education:22679}  Medical History:   Past Medical History  Diagnosis Date  . Bipolar disorder     Has psychiatrist.  BH admission (suicidal) 07/20/11.  Marland Kitchen Hyperlipemia     Intol of meds: GI side effects  . 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  No hx of MRSA infection.  . 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 Depression   . Nephrolithiasis   . GERD (gastroesophageal reflux disease)   . Inguinal hernia 02/25/11    Left sided hernia with fat  . Hip pain, left 2011-2012    Left hip and groin: MRI pelvis and left hip 02/18/11 showed NORMAL hip, with small inguinal hernia containing fat and sigmoid diverticulosis.  Hip pain presumably referred pain from hernia/diverticular dz??.  . Cervical spondylosis     Primarily C4-5 and C5-6--referred to [facility] 02/2011.        Outpatient Encounter Prescriptions as of 09/06/2012  Medication Sig Dispense Refill  . buPROPion (WELLBUTRIN SR) 150 MG 12 hr tablet TAKE ONE TABLET BY MOUTH ONE TIME DAILY  30 tablet  0  . clonazePAM (KLONOPIN) 1 MG tablet Take 1 mg by mouth at bedtime as needed.      . gabapentin (NEURONTIN) 300 MG capsule Take 1 capsule (300 mg total) by mouth 3 (three) times daily.  180 capsule  0  . lamoTRIgine (LAMICTAL) 100 MG tablet Take 2 tablets (200 mg total) by mouth 2 (two) times daily.  30 tablet  2  . omeprazole (PRILOSEC) 40 MG capsule Take 1 capsule (40 mg total) by mouth 2 (two) times daily.  60  capsule  5  . sildenafil (VIAGRA) 100 MG tablet 50-100 mg daily as needed for erectile dysfunction  3 tablet  0  . traZODone (DESYREL) 100 MG tablet Take 0.5 tablets (50 mg total) by mouth at bedtime.  90 tablet  0        ***  Sexual History:   History  Sexual Activity  . Sexually Active: Yes    Abuse/Trauma History: ***  Psychiatric History:  ***  Family Med/Psych History:  Family History   Problem Relation Age of Onset  . Alcohol abuse Mother   . Alcohol abuse Father   . Depression Daughter   . Paranoid behavior Daughter     Risk of Suicide/Violence: {desc; high/low:14016} ***  Impression/DX:  ***  Disposition/Plan:  ***  Diagnosis:    Axis I:  No diagnosis found.      Axis II: {psych axis 2:31910}       Axis III:  ***      Axis IV:  {psych axis iv:31915}          Axis V:  {psych axis v score:31919}

## 2012-10-01 ENCOUNTER — Ambulatory Visit (INDEPENDENT_AMBULATORY_CARE_PROVIDER_SITE_OTHER): Payer: Medicare Other | Admitting: Psychiatry

## 2012-10-01 DIAGNOSIS — F319 Bipolar disorder, unspecified: Secondary | ICD-10-CM

## 2012-10-01 NOTE — Progress Notes (Signed)
   THERAPIST PROGRESS NOTE  Session Time: 3:00-3:50 pm  Participation Level: Active  Behavioral Response: CasualAlertManic  Type of Therapy: Individual Therapy  Treatment Goals addressed: Coping  Interventions: CBT and Solution Focused  Summary: Sean Hill is a 72 y.o. male who presents with moderate manic mood and behavior. Patient lacked insight into his mania, despite giving an example as to how he gave his car for scrap metal the other day when it broke down instead of looking into the cause of the problem. He regrets this decision now and admitted he was "probably manic". He said he stopped doing his daily mood tracking application and agreed to resume this due to his inability to identify his mood state each day. He was very "medicaiton focused", as he gets when he is in a manic phase. He feels like none of them are working and is dissatisfied with the side affects. He said he is enjoying doing "holistic" mental health treatments and has done acupuncture weekly since our last visit. He still wants to explore a holistic psychiatrist to see if that would be more beneficial to him. He agrees to resume his daily mood tracker, look into adding therapeutic message and aromatherapy to his regimen of wellness activities and is going to explore holistic psychiatrists.   Suicidal/Homicidal: Nowithout intent/plan  Therapist Response: Assessed level of mania and determined it to be moderate. Explored daily maintenance list of wellness activities and recommended aromatherapy message to his regimen to explore. Gave patient the phone number to a holistic psychiatrist in [place] to explore before the next session.   Plan: Return again in 1 weeks. Continue treating depression and mania symptoms.  Diagnosis: Axis I: Bipolar, Manic    Axis II: No diagnosis    Carman Ching, LCSW 10/01/2012

## 2012-10-05 ENCOUNTER — Encounter: Payer: Self-pay | Admitting: Internal Medicine

## 2012-10-05 ENCOUNTER — Ambulatory Visit (INDEPENDENT_AMBULATORY_CARE_PROVIDER_SITE_OTHER): Payer: Medicare Other | Admitting: Internal Medicine

## 2012-10-05 VITALS — BP 140/70 | HR 80 | Temp 98.1°F | Wt 260.0 lb

## 2012-10-05 DIAGNOSIS — J209 Acute bronchitis, unspecified: Secondary | ICD-10-CM

## 2012-10-05 DIAGNOSIS — K149 Disease of tongue, unspecified: Secondary | ICD-10-CM

## 2012-10-05 MED ORDER — DOXYCYCLINE HYCLATE 100 MG PO TABS: 100.0000 mg | ORAL_TABLET | Freq: Two times a day (BID) | ORAL | Status: DC

## 2012-10-05 NOTE — Progress Notes (Signed)
Subjective:    Patient ID: Sean Hill, male    DOB: 11-05-1940, 72 y.o.   MRN: [id number]  HPI  72 year old white male with history of bipolar disorder complains of nonproductive cough for last 9 days. Symptoms started with nasal congestion and sore throat. He denies any fever but describes chest tightness and mild shortness of breath. Several days ago he coughed up yellowish sputum but now sputum is whitish in color.  Patient also complains of dry, cracked, and irritated tongue. This has been ongoing for last 2 months. He denies any white film on his tongue in the morning. Unclear whether symptoms related to some of his psychiatric medications.  Review of Systems Mild shortness of breath    Past Medical History  Diagnosis Date  . Bipolar disorder     Has psychiatrist.  BH admission (suicidal) 07/20/11.  Marland Kitchen Hyperlipemia     Intol of meds: GI side effects  . 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  No hx of MRSA infection.  . 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 Depression   . Nephrolithiasis   . GERD (gastroesophageal reflux disease)   . Inguinal hernia 02/25/11    Left sided hernia with fat  . Hip pain, left 2011-2012    Left hip and groin: MRI pelvis and left hip 02/18/11 showed NORMAL hip, with small inguinal hernia containing fat and sigmoid diverticulosis.  Hip pain presumably referred pain from hernia/diverticular dz??.  . Cervical spondylosis     Primarily C4-5 and C5-6--referred to [facility] 02/2011.    History   Social History  . Marital Status: Married    Spouse Name: N/A    Number of Children: N/A  . Years of Education: N/A   Occupational History  . Not on file.   Social History Main Topics  . Smoking status: Never Smoker   . Smokeless tobacco: Not on file  . Alcohol Use: No  . Drug Use: No  . Sexually Active: Yes   Other Topics Concern  . Not on file   Social History  Narrative   Married, lives in [place].  Retired Quarry manager.Two daughters, both married, 2 grandchildren.No regular exercise.  Never smoker.  No ETOH.  No drugs.    Past Surgical History  Procedure Date  . 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 Ankle fracture surgery     hardware still in both ankles (titanium)  . Inguinal hernia repair     Right side with mesh about 1993  . Elbow surgery     left---tendon  . Ankle surgery     Ankle tendon surgery    Family History  Problem Relation Age of Onset  . Alcohol abuse Mother   . Alcohol abuse Father   . Depression Daughter   . Paranoid behavior Daughter     Allergies  Allergen Reactions  . Penicillins Hives    Current Outpatient Prescriptions on File Prior to Visit  Medication Sig Dispense Refill  . buPROPion (WELLBUTRIN SR) 150 MG 12 hr tablet TAKE ONE TABLET BY MOUTH ONE TIME DAILY  30 tablet  0  . clonazePAM (KLONOPIN) 1 MG tablet Take 1 mg by mouth at bedtime as needed.      . gabapentin (NEURONTIN) 300 MG capsule Take 1 capsule (300 mg total) by mouth 3 (three) times daily.  180 capsule  0  .  lamoTRIgine (LAMICTAL) 100 MG tablet Take 2 tablets (200 mg total) by mouth 2 (two) times daily.  30 tablet  2  . omeprazole (PRILOSEC) 40 MG capsule Take 1 capsule (40 mg total) by mouth 2 (two) times daily.  60 capsule  5  . traZODone (DESYREL) 100 MG tablet Take 0.5 tablets (50 mg total) by mouth at bedtime.  90 tablet  0  . [DISCONTINUED] venlafaxine (EFFEXOR-XR) 150 MG 24 hr capsule Take 1 capsule (150 mg total) by mouth 2 (two) times daily at 8am and 2pm.  60 capsule  1    BP 140/70  Pulse 80  Temp 98.1 F (36.7 C) (Oral)  Wt 260 lb (117.935 kg)  SpO2 96%    Objective:   Physical Exam  Constitutional: He appears well-developed and well-nourished.  HENT:  Head: Normocephalic and atraumatic.  Right Ear: External ear normal.  Left Ear: External ear normal.  Mouth/Throat: No oropharyngeal exudate.         Tongue slightly enlarged, erythematous and has cracked appearance Oropharyngeal erythema  Neck: Neck supple.       No neck tenderness  Cardiovascular: Normal rate, regular rhythm and normal heart sounds.   Pulmonary/Chest: Effort normal.       Faint coarse breath sounds bilaterally, no active wheezing or rales  Psychiatric: He has a normal mood and affect. His behavior is normal.          Assessment & Plan:

## 2012-10-05 NOTE — Assessment & Plan Note (Signed)
72 year old white male with bipolar disorder presents with cough x9 days. Symptoms consistent with acute bronchitis. Treat with doxycycline 100 mg twice daily for 10 days.  Patient advised to call office if symptoms persist or worsen.

## 2012-10-05 NOTE — Assessment & Plan Note (Signed)
Patient complains of irritated, cracked, and dry tongue. It is unclear whether symptoms related to his psychiatric medications. No history to suggest oral thrush. Refer to ENT for further evaluation.

## 2012-10-05 NOTE — Patient Instructions (Addendum)
Please call our office if your symptoms do not improve or gets worse.  

## 2012-10-08 ENCOUNTER — Ambulatory Visit (INDEPENDENT_AMBULATORY_CARE_PROVIDER_SITE_OTHER): Payer: Medicare Other | Admitting: Psychiatry

## 2012-10-08 ENCOUNTER — Telehealth: Payer: Self-pay | Admitting: *Deleted

## 2012-10-08 DIAGNOSIS — F319 Bipolar disorder, unspecified: Secondary | ICD-10-CM

## 2012-10-08 NOTE — Progress Notes (Signed)
   THERAPIST PROGRESS NOTE  Session Time: 3:00-3:50 pm  Participation Level: Active  Behavioral Response: CasualAlertDepressed  Type of Therapy: Individual Therapy  Treatment Goals addressed: Coping  Interventions: CBT and Solution Focused  Summary: Sean Hill is a 72 y.o. male who presents with mild depressed mood and affect. Patient no longer appears to be functioning in a manic phase. He is less preoccupied with being frustrated with the mental health medications he is on and is more "accepting" of his need for them. Patient becomes very irritated with his medications when he is in a manic phase and his primary focus on treatment at that point is to explore how to get off from them. He states he has felt stable for the past week but still has no desire to live life. He is not suicidal, but not happy either. He contracted for safety and appears to be at his baseline. He expressed some dissatisfaction in his marriage due to being so different from his wife and feeling "constrained". He described how he does not go on vacations because his wife does not enjoy those things. He feels unhappy because he is not living a fun life.    Suicidal/Homicidal: Nowithout intent/plan  Therapist Response: Assessed his level of depression and educated patient on the shift from mania into depression. Taught skills to help patient identify the shift. Explored barriers to happiness and completed a safety assessment.   Plan: Return again when patient can get an appointment. He did not schedule appointments in advance, per writers request and as a consequence he does not have a next appointment. He is on the cancellation list.   Diagnosis: Axis I: Bipolar, mixed    Axis II: No diagnosis    Carman Ching, LCSW 10/08/2012

## 2012-10-08 NOTE — Telephone Encounter (Signed)
Message copied by Jacqualyn Posey on Mon Oct 08, 2012 10:31 AM ------      Message from: Meda Coffee      Created: Fri Oct 05, 2012  2:09 PM       Call pt - has he ever had colonoscopy?  If no, I suggest referral for screening colonoscopy

## 2012-10-08 NOTE — Telephone Encounter (Signed)
Pt declined colonoscopy

## 2012-10-08 NOTE — Telephone Encounter (Signed)
Opened in error

## 2012-11-03 ENCOUNTER — Other Ambulatory Visit: Payer: Self-pay

## 2012-11-04 ENCOUNTER — Other Ambulatory Visit: Payer: Self-pay | Admitting: Internal Medicine

## 2012-11-14 ENCOUNTER — Ambulatory Visit (INDEPENDENT_AMBULATORY_CARE_PROVIDER_SITE_OTHER): Payer: Medicare Other | Admitting: Psychiatry

## 2012-11-14 ENCOUNTER — Encounter (HOSPITAL_COMMUNITY): Payer: Self-pay | Admitting: Emergency Medicine

## 2012-11-14 ENCOUNTER — Emergency Department (HOSPITAL_COMMUNITY)
Admission: EM | Admit: 2012-11-14 | Discharge: 2012-11-14 | Disposition: A | Discharge status: Home / Self Care | Payer: Medicare Other | Attending: Emergency Medicine | Admitting: Emergency Medicine

## 2012-11-14 DIAGNOSIS — F319 Bipolar disorder, unspecified: Secondary | ICD-10-CM | POA: Insufficient documentation

## 2012-11-14 DIAGNOSIS — Z862 Personal history of diseases of the blood and blood-forming organs and certain disorders involving the immune mechanism: Secondary | ICD-10-CM | POA: Insufficient documentation

## 2012-11-14 DIAGNOSIS — Z79899 Other long term (current) drug therapy: Secondary | ICD-10-CM | POA: Insufficient documentation

## 2012-11-14 DIAGNOSIS — Z87442 Personal history of urinary calculi: Secondary | ICD-10-CM | POA: Insufficient documentation

## 2012-11-14 DIAGNOSIS — R269 Unspecified abnormalities of gait and mobility: Secondary | ICD-10-CM | POA: Insufficient documentation

## 2012-11-14 DIAGNOSIS — R413 Other amnesia: Secondary | ICD-10-CM | POA: Insufficient documentation

## 2012-11-14 DIAGNOSIS — T50995A Adverse effect of other drugs, medicaments and biological substances, initial encounter: Secondary | ICD-10-CM | POA: Insufficient documentation

## 2012-11-14 DIAGNOSIS — Z8719 Personal history of other diseases of the digestive system: Secondary | ICD-10-CM | POA: Insufficient documentation

## 2012-11-14 DIAGNOSIS — T50905S Adverse effect of unspecified drugs, medicaments and biological substances, sequela: Secondary | ICD-10-CM

## 2012-11-14 DIAGNOSIS — K219 Gastro-esophageal reflux disease without esophagitis: Secondary | ICD-10-CM | POA: Insufficient documentation

## 2012-11-14 DIAGNOSIS — Z8639 Personal history of other endocrine, nutritional and metabolic disease: Secondary | ICD-10-CM | POA: Insufficient documentation

## 2012-11-14 DIAGNOSIS — R259 Unspecified abnormal involuntary movements: Secondary | ICD-10-CM | POA: Insufficient documentation

## 2012-11-14 LAB — COMPREHENSIVE METABOLIC PANEL
ALT: 35 U/L (ref 0–53)
AST: 36 U/L (ref 0–37)
Alkaline Phosphatase: 107 U/L (ref 39–117)
CO2: 26 mEq/L (ref 19–32)
Calcium: 9.7 mg/dL (ref 8.4–10.5)
Chloride: 100 mEq/L (ref 96–112)
GFR calc Af Amer: 81 mL/min — ABNORMAL LOW (ref >90)
GFR calc non Af Amer: 70 mL/min — ABNORMAL LOW (ref >90)
Glucose, Bld: 94 mg/dL (ref 70–99)
Potassium: 4 mEq/L (ref 3.5–5.1)
Sodium: 137 mEq/L (ref 135–145)

## 2012-11-14 LAB — RAPID URINE DRUG SCREEN, HOSP PERFORMED
Amphetamines: NOT DETECTED
Benzodiazepines: NOT DETECTED
Cocaine: NOT DETECTED
Opiates: NOT DETECTED

## 2012-11-14 LAB — CBC
Hemoglobin: 15.4 g/dL (ref 13.0–17.0)
Platelets: 291 10*3/uL (ref 150–400)
RBC: 5.42 MIL/uL (ref 4.22–5.81)
WBC: 8.4 10*3/uL (ref 4.0–10.5)

## 2012-11-14 LAB — ETHANOL: Alcohol, Ethyl (B): <11 mg/dL (ref 0–11)

## 2012-11-14 NOTE — ED Provider Notes (Signed)
History     CSN: [id number]  Arrival date & time 11/14/12  1501   First MD Initiated Contact with Patient 11/14/12 1507      Chief Complaint  Patient presents with  . Medical Clearance    (Consider location/radiation/quality/duration/timing/severity/associated sxs/prior treatment) HPI Comments: 72 year old male presents emergency department with his wife with concerns about his bipolar medications. 3 weeks ago he was placed on lithium without any change in any of his other medications. Over the past 3 weeks he has been feeling off balance and shaky with decreased short-term memory. Wife states this has been an issue on and off for the past few years whenever he has medication changes. Both patient and wife were unhappy with the psychiatrist because she is very hard to get in touch with, and they have been unable to discuss this issue with the lithium with her. Patient's counselor found him a new psychiatrist, however he has not been able to have his initial appointment thus far. Denies any other problems at this time. No chest pain, sob, nausea, vomiting, SI, HI.   The history is provided by the patient and the spouse.    Past Medical History  Diagnosis Date  . Bipolar disorder     Has psychiatrist.  BH admission (suicidal) 07/20/11.  Marland Kitchen Hyperlipemia     Intol of meds: GI side effects  . 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  No hx of MRSA infection.  . 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 Depression   . Nephrolithiasis   . GERD (gastroesophageal reflux disease)   . Inguinal hernia 02/25/11    Left sided hernia with fat  . Hip pain, left 2011-2012    Left hip and groin: MRI pelvis and left hip 02/18/11 showed NORMAL hip, with small inguinal hernia containing fat and sigmoid diverticulosis.  Hip pain presumably referred pain from hernia/diverticular dz??.  . Cervical spondylosis     Primarily C4-5 and C5-6--referred to [facility] 02/2011.    Past Surgical History  Procedure Laterality Date  . 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 Ankle fracture surgery      hardware still in both ankles (titanium)  . Inguinal hernia repair      Right side with mesh about 1993  . Elbow surgery      left---tendon  . Ankle surgery      Ankle tendon surgery    Family History  Problem Relation Age of Onset  . Alcohol abuse Mother   . Alcohol abuse Father   . Depression Daughter   . Paranoid behavior Daughter     History  Substance Use Topics  . Smoking status: Never Smoker   . Smokeless tobacco: Not on file  . Alcohol Use: No      Review of Systems  Constitutional: Positive for activity change.  Respiratory: Negative for shortness of breath.   Cardiovascular: Negative for chest pain.  Gastrointestinal: Negative for nausea and vomiting.  Musculoskeletal: Positive for gait problem.  Neurological: Positive for dizziness and tremors. Negative for seizures and syncope.  Psychiatric/Behavioral: Positive for decreased concentration. Negative for suicidal ideas, hallucinations, behavioral problems and dysphoric mood.  All other systems reviewed and are negative.    Allergies  Penicillins  Home Medications   Current Outpatient Rx  Name  Route  Sig  Dispense  Refill  . clonazePAM (KLONOPIN) 1  MG tablet   Oral   Take 1 mg by mouth at bedtime as needed.         . lamoTRIgine (LAMICTAL) 100 MG tablet   Oral   Take 2 tablets (200 mg total) by mouth 2 (two) times daily.   30 tablet   2   . omeprazole (PRILOSEC) 40 MG capsule      TAKE ONE CAPSULE BY MOUTH TWICE DAILY   60 capsule   4     BP 139/69  Pulse 83  Temp(Src) 97.9 F (36.6 C) (Oral)  Resp 16  SpO2 94%  Physical Exam  Nursing note and vitals reviewed. Constitutional: He is oriented to person, place, and time. He appears well-developed. No distress.  Obese  HENT:  Head: Normocephalic and  atraumatic.  Mouth/Throat: Oropharynx is clear and moist.  Eyes: Conjunctivae and EOM are normal. Pupils are equal, round, and reactive to light. No scleral icterus.  Neck: Normal range of motion. Neck supple. No thyromegaly present.  Cardiovascular: Normal rate, regular rhythm, normal heart sounds and intact distal pulses.   Pulmonary/Chest: Effort normal and breath sounds normal. No respiratory distress.  Abdominal: Soft. Bowel sounds are normal. There is no tenderness.  Musculoskeletal: Normal range of motion. He exhibits no edema.  Neurological: He is alert and oriented to person, place, and time. He has normal strength. He displays tremor. No cranial nerve deficit or sensory deficit. Gait (unsteady) abnormal. GCS eye subscore is 4. GCS verbal subscore is 5. GCS motor subscore is 6.  Positive Romberg  Skin: Skin is warm and dry. He is not diaphoretic.  Psychiatric: He has a normal mood and affect. His speech is normal and behavior is normal. Judgment normal. He expresses no homicidal and no suicidal ideation. He exhibits abnormal recent memory.    ED Course  Procedures (including critical care time)  Labs Reviewed  COMPREHENSIVE METABOLIC PANEL - Abnormal; Notable for the following:    GFR calc non Af Amer 70 (*)    GFR calc Af Amer 81 (*)    All other components within normal limits  LITHIUM LEVEL - Abnormal; Notable for the following:    Lithium Lvl 0.40 (*)    All other components within normal limits  CBC  ETHANOL  URINE RAPID DRUG SCREEN (HOSP PERFORMED)   No results found.   1. Medication reaction, sequela       MDM  Patient is experiencing increased serotonin in his body do to venlafaxine interacting with the rest of his medications that he takes. I discussed this case with Dr. Radford Pax who looked up interactions of all of the drugs that patient is on. Venlafaxine increases effective most of his medications causing increased serotonin in the body. I advised patient to  begin to wean off of them that seen in followup with the psychiatrist as soon as possible. He is in no apparent distress and stable for discharge. Return precautions discussed. Patient and wife state understanding of plan and are agreeable.      Trevor Mace, PA-C 11/14/12 1706

## 2012-11-14 NOTE — ED Notes (Signed)
Spoke with [place], [name].  Plan is to d/c pt soon.  Robyn requested to keep pt in triage.

## 2012-11-14 NOTE — Progress Notes (Signed)
   THERAPIST PROGRESS NOTE  Session Time: 2:00-2:50 pm  Participation Level: Active  Behavioral Response: Casual, Neat and Well GroomedConfused, Drowsy and LethargicAnxious  Type of Therapy: Family Therapy  Treatment Goals addressed: Communication: Referred Patient to the Psychiatric ED\ and Coping  Interventions: Solution Focused  Summary: Sean Hill is a 72 y.o. male who presents with anxious mood and affect. Patient fell in the lobby due to feeling dizzy and disoriented. Paitents wife was present for the session and expressed serous concerns along with patient about patient being overmedicated to the point patient is a danger to himself and unable to physically and mentally function. Patients wife states they have tried repeatedly to get in contact with patients PA, [name] to discuss the medicaiton concerns but has not received any return calls. Patient was assessed and it was determined to send him inpatient to the psychiatric ED for medication stabilization. Patiens wife agreed and drove patient across the street for admission.   Suicidal/Homicidal: Nowithout intent/plan  Therapist Response: Assessed overall level of function and referred patient to psychiatric ED.  Plan: Return again after inpatient hospitalization.   Diagnosis: Axis I: Bipolar Mixed    Axis II: No diagnosis    Carman Ching, LCSW 11/14/2012

## 2012-11-14 NOTE — ED Notes (Signed)
States that he has bipolar and that 3 weeks ago they wanted to switch him to lithium from his other bipolar medicine.  States he has been prescribed an increasing amount of lithium but has not weened off any of his other medicine.  States he has been feeling like he has no balance, shaking, has fallen multiple times.  States he is overmedicated.  States he cannot stand anywhere unless he is holding onto something.  Denies SI/HI.

## 2012-11-18 NOTE — ED Provider Notes (Signed)
Medical screening examination/treatment/procedure(s) were performed by non-physician practitioner and as supervising physician I was immediately available for consultation/collaboration.    Rosalinda Seaman L Adarsh Mundorf, MD 11/18/12 1506 

## 2012-11-21 ENCOUNTER — Ambulatory Visit (INDEPENDENT_AMBULATORY_CARE_PROVIDER_SITE_OTHER): Payer: Medicare Other | Admitting: Psychiatry

## 2012-11-21 DIAGNOSIS — F319 Bipolar disorder, unspecified: Secondary | ICD-10-CM

## 2012-11-21 NOTE — Progress Notes (Signed)
   THERAPIST PROGRESS NOTE  Session Time: 2:00-2:50 pm  Participation Level: Active  Behavioral Response: CasualAlertDepressed  Type of Therapy: Individual Therapy  Treatment Goals addressed: Coping  Interventions: CBT and Solution Focused  Summary: Sean Hill is a 72 y.o. male who presents with mild depressed mood and affect. Patient appears medically stable, as apposed to the last session. Patient states he went to the medical floor at [facility] and was not admitted, but he doctor made some minor medication adjustments that helped reduce some of the dizziness. Patient was aware when it was brought to his attention that he has not been severely depressed or manic in several weeks and has not talked about suicide. This prompted him to talk about suicide. He mentioned how he would still like to die "in the near future" because he fears getting alzheimer's or dementia. Patient denied having any active plans. He described not feeling fulfilled and happy with his life now and remembered times from his past where he was happier and more fulfilled. He identified wanting to go to school to be a doctor, but feeling too old to fulfill that dream now. He talked about how he enjoys learning but does not have outlets to discuss with others what he learns. He was open to the idea of taking a class or training to engage his need for learning and connection with others on an intellectual level and agreed to talk about this in his next session. He also agreed to contact Dr. Farris Has at [facility] to get an appointment with her for medication management.   Suicidal/Homicidal: Nowithout intent/plan  Therapist Response: Assessed level of depression, completed suicide assessment, explored eductional opportunities to help with feeling more fulfilled. Gave patient the website for [employer] to explore training opportunities.   Plan: Return again in 1 weeks.  Diagnosis: Axis I: Bipolar  Disorder    Axis II: No diagnosis    Taqwa Deem E, LCSW 11/21/2012

## 2012-11-29 ENCOUNTER — Ambulatory Visit (INDEPENDENT_AMBULATORY_CARE_PROVIDER_SITE_OTHER): Payer: Medicare Other | Admitting: Psychiatry

## 2012-11-29 DIAGNOSIS — F319 Bipolar disorder, unspecified: Secondary | ICD-10-CM

## 2012-11-29 NOTE — Progress Notes (Signed)
   THERAPIST PROGRESS NOTE  Session Time: 3:00-3:50 pm  Participation Level: Active  Behavioral Response: Casual, Neat and Well GroomedAlertEuthymic  Type of Therapy: Individual Therapy  Treatment Goals addressed: Coping  Interventions: Solution Focused  Summary: Sean Hill is a 72 y.o. male who presents with euthymic mood and affect. Patient reports feeling better after being off some of his medications. Patient presents as clear, goal directed and denies depression and anxiety symptoms. He talked about his plan to continue trying to work with doctors to reduce his medications to a manageable level. Patient also expressed interests in things that he has enjoyed lately and discussed wanting to take a vacation to [place] with his wife.  He is managing his mood symptoms and states he is still working out with his weights and doing daily meditations.   Suicidal/Homicidal: Nowithout intent/plan  Therapist Response: Assessed overall level of mood. Inquired about previous medication management appointment. Reviewed coping skills for managing symptoms.   Plan: Return again in 1 weeks.  Diagnosis: Axis I: Bipolar Disorder    Axis II: No diagnosis    Carman Ching, LCSW 11/29/2012

## 2012-12-04 ENCOUNTER — Encounter (HOSPITAL_COMMUNITY): Payer: Self-pay | Admitting: *Deleted

## 2012-12-04 ENCOUNTER — Inpatient Hospital Stay (HOSPITAL_COMMUNITY)
Admission: RE | Admit: 2012-12-04 | Discharge: 2012-12-10 | DRG: 885 | Disposition: A | Discharge status: Home / Self Care | Payer: No Typology Code available for payment source | Attending: Psychiatry | Admitting: Psychiatry

## 2012-12-04 DIAGNOSIS — Z79899 Other long term (current) drug therapy: Secondary | ICD-10-CM

## 2012-12-04 DIAGNOSIS — F339 Major depressive disorder, recurrent, unspecified: Secondary | ICD-10-CM

## 2012-12-04 DIAGNOSIS — F319 Bipolar disorder, unspecified: Secondary | ICD-10-CM | POA: Diagnosis present

## 2012-12-04 DIAGNOSIS — F411 Generalized anxiety disorder: Secondary | ICD-10-CM | POA: Diagnosis present

## 2012-12-04 DIAGNOSIS — F332 Major depressive disorder, recurrent severe without psychotic features: Principal | ICD-10-CM | POA: Diagnosis present

## 2012-12-04 DIAGNOSIS — R45851 Suicidal ideations: Secondary | ICD-10-CM

## 2012-12-04 DIAGNOSIS — J45909 Unspecified asthma, uncomplicated: Secondary | ICD-10-CM | POA: Diagnosis present

## 2012-12-04 HISTORY — DX: Unspecified asthma, uncomplicated: J45.909

## 2012-12-04 LAB — LITHIUM LEVEL: Lithium Lvl: 0.15 mEq/L — ABNORMAL LOW (ref 0.80–1.40)

## 2012-12-04 LAB — CBC
HCT: 48.7 % (ref 39.0–52.0)
Hemoglobin: 16.7 g/dL (ref 13.0–17.0)
MCHC: 34.3 g/dL (ref 30.0–36.0)
MCV: 83.5 fL (ref 78.0–100.0)

## 2012-12-04 LAB — COMPREHENSIVE METABOLIC PANEL
Alkaline Phosphatase: 125 U/L — ABNORMAL HIGH (ref 39–117)
BUN: 9 mg/dL (ref 6–23)
Creatinine, Ser: 1.04 mg/dL (ref 0.50–1.35)
GFR calc Af Amer: 83 mL/min — ABNORMAL LOW (ref >90)
Glucose, Bld: 96 mg/dL (ref 70–99)
Potassium: 3.6 mEq/L (ref 3.5–5.1)
Total Bilirubin: 0.4 mg/dL (ref 0.3–1.2)
Total Protein: 7 g/dL (ref 6.0–8.3)

## 2012-12-04 MED ORDER — CLONAZEPAM 0.5 MG PO TABS
0.5000 mg | ORAL_TABLET | Freq: Every evening | ORAL | Status: DC | PRN
  Administered 2012-12-04 – 2012-12-09 (×5): 0.5 mg via ORAL
  Filled 2012-12-04 (×5): qty 1

## 2012-12-04 MED ORDER — TRAZODONE HCL 100 MG PO TABS
100.0000 mg | ORAL_TABLET | Freq: Every evening | ORAL | Status: DC | PRN
  Administered 2012-12-05 – 2012-12-09 (×5): 100 mg via ORAL
  Filled 2012-12-04 (×5): qty 1

## 2012-12-04 MED ORDER — MAGNESIUM HYDROXIDE 400 MG/5ML PO SUSP: 30.0000 mL | Freq: Every day | ORAL | Status: DC | PRN

## 2012-12-04 MED ORDER — VENLAFAXINE HCL ER 150 MG PO CP24
150.0000 mg | ORAL_CAPSULE | Freq: Every day | ORAL | Status: DC
  Filled 2012-12-04 (×6): qty 1

## 2012-12-04 MED ORDER — ACETAMINOPHEN 325 MG PO TABS
650.0000 mg | ORAL_TABLET | Freq: Four times a day (QID) | ORAL | Status: DC | PRN
  Administered 2012-12-06 – 2012-12-09 (×3): 650 mg via ORAL

## 2012-12-04 MED ORDER — ADULT MULTIVITAMIN W/MINERALS CH
1.0000 | ORAL_TABLET | Freq: Every day | ORAL | Status: DC
  Administered 2012-12-04 – 2012-12-10 (×7): 1 via ORAL
  Filled 2012-12-04 (×9): qty 1

## 2012-12-04 MED ORDER — BUPROPION HCL ER (SR) 150 MG PO TB12
150.0000 mg | ORAL_TABLET | Freq: Two times a day (BID) | ORAL | Status: DC
Start: 2012-12-04 — End: 2012-12-05
  Administered 2012-12-05: 150 mg via ORAL
  Filled 2012-12-04 (×8): qty 1

## 2012-12-04 MED ORDER — GABAPENTIN 300 MG PO CAPS
600.0000 mg | ORAL_CAPSULE | Freq: Three times a day (TID) | ORAL | Status: DC
  Administered 2012-12-04 – 2012-12-07 (×8): 600 mg via ORAL
  Filled 2012-12-04 (×2): qty 2
  Filled 2012-12-04: qty 1
  Filled 2012-12-04 (×10): qty 2
  Filled 2012-12-04: qty 1

## 2012-12-04 MED ORDER — LAMOTRIGINE 200 MG PO TABS
200.0000 mg | ORAL_TABLET | Freq: Two times a day (BID) | ORAL | Status: DC
  Administered 2012-12-04 – 2012-12-05 (×2): 200 mg via ORAL
  Filled 2012-12-04 (×3): qty 1
  Filled 2012-12-04: qty 2
  Filled 2012-12-04 (×3): qty 1
  Filled 2012-12-04: qty 2

## 2012-12-04 MED ORDER — ALUM & MAG HYDROXIDE-SIMETH 200-200-20 MG/5ML PO SUSP: 30.0000 mL | ORAL | Status: DC | PRN

## 2012-12-04 MED ORDER — PANTOPRAZOLE SODIUM 40 MG PO TBEC
80.0000 mg | DELAYED_RELEASE_TABLET | Freq: Every day | ORAL | Status: DC
  Administered 2012-12-04 – 2012-12-10 (×7): 80 mg via ORAL
  Filled 2012-12-04 (×10): qty 2

## 2012-12-04 NOTE — Progress Notes (Signed)
Adult Psychoeducational Group Note  Date:  12/04/2012 Time:  9:57 PM  Group Topic/Focus:  Making Healthy Choices:   The focus of this group is to help patients identify negative/unhealthy choices they were using prior to admission and identify positive/healthier coping strategies to replace them upon discharge.  Participation Level:  Active  Participation Quality:  Appropriate  Affect:  Appropriate  Cognitive:  Alert  Insight: Appropriate  Engagement in Group:  Supportive  Modes of Intervention:  Problem-solving  Additional Comments:Mr. Ulin cooperated in group and respected everyone's differences.    Annell Greening Comfrey 12/04/2012, 9:57 PM

## 2012-12-04 NOTE — Tx Team (Signed)
Initial Interdisciplinary Treatment Plan  PATIENT STRENGTHS: (choose at least two) Ability for insight Average or above average intelligence Communication skills Financial means General fund of knowledge Motivation for treatment/growth  PATIENT STRESSORS: Medication change or noncompliance Occupational concerns   PROBLEM LIST: Problem List/Patient Goals Date to be addressed Date deferred Reason deferred Estimated date of resolution  "I want to try to safely get off of all medications to treat the bipolar." 04 Dec 2012      "Decreased depression and more motivation to live." 04 Dec 2012                                                DISCHARGE CRITERIA:  Ability to meet basic life and health needs Adequate post-discharge living arrangements Improved stabilization in mood, thinking, and/or behavior Medical problems require only outpatient monitoring Motivation to continue treatment in a less acute level of care Need for constant or close observation no longer present Reduction of life-threatening or endangering symptoms to within safe limits Safe-care adequate arrangements made Verbal commitment to aftercare and medication compliance  PRELIMINARY DISCHARGE PLAN: Attend aftercare/continuing care group Outpatient therapy Return to previous living arrangement Return to previous work or school arrangements  PATIENT/FAMIILY INVOLVEMENT: This treatment plan has been presented to and reviewed with the patient, Sean Hill, and/or family member.  The patient and family have been given the opportunity to ask questions and make suggestions.  Sean Hill 12/04/2012, 7:18 PM

## 2012-12-04 NOTE — BH Assessment (Signed)
Assessment Note   Sean Hill is an 72 y.o. male. PT SEES SHANNON ENGLEHORN IN THE [facility] AND KE[facility].  HE HAS COMPLAINED ABOUT HIS MEDICATIONS BEING TOO STRONG  CAUSING HIM TO BE UNSTEADY ON HIS FEET AND CONFUSED AT TIMES.  HE DID NOT GET SUPPORT FROM HIS PSYCHIATRIST IN STOPPING HIS MEDICATIONS, THEREFORE HE HAS BEGUN TO TAPER HIS MEDICATIONS HIMSELF. HE HAS HAD WITHDRAWALS FROM THE EFFEXOR TAPER  AND IS NOW SAYING HE CAN NO LONGER GO ON THIS WAY AND IS THREATENING TO GO TO THE [place]ING [place]K AND JUMP TO HIS DEATH. HE REPORTS HE HAS HAD SEVERAL SUICIDE ATTEMPTS IN THE PAST 7 YEARS AND IS SURE THIS PLAN WILL WORK. PT DENIES H/I AND IS NOT PSYCHOTIC. PT ACCEPTED TO [facility].  Axis I: Major Depression, Recurrent severe WITHOUT PSYCHOTIC FEATURES Axis II: Deferred Axis III:  Past Medical History  Diagnosis Date  . Bipolar disorder     Has psychiatrist.  BH admission (suicidal) 07/20/11.  Marland Kitchen Hyperlipemia     Intol of meds: GI side effects  . 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  No hx of MRSA infection.  . 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 Depression   . Nephrolithiasis   . GERD (gastroesophageal reflux disease)   . Inguinal hernia 02/25/11    Left sided hernia with fat  . Hip pain, left 2011-2012    Left hip and groin: MRI pelvis and left hip 02/18/11 showed NORMAL hip, with small inguinal hernia containing fat and sigmoid diverticulosis.  Hip pain presumably referred pain from hernia/diverticular dz??.  . Cervical spondylosis     Primarily C4-5 and C5-6--referred to [facility] 02/2011.   Axis IV: problems with access to health care services Axis V: 11-20 some danger of hurting self or others possible OR occasionally fails to maintain minimal personal hygiene OR gross impairment in communication  Past Medical History:  Past Medical History   Diagnosis Date  . Bipolar disorder     Has psychiatrist.  BH admission (suicidal) 07/20/11.  Marland Kitchen Hyperlipemia     Intol of meds: GI side effects  . 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  No hx of MRSA infection.  . 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 Depression   . Nephrolithiasis   . GERD (gastroesophageal reflux disease)   . Inguinal hernia 02/25/11    Left sided hernia with fat  . Hip pain, left 2011-2012    Left hip and groin: MRI pelvis and left hip 02/18/11 showed NORMAL hip, with small inguinal hernia containing fat and sigmoid diverticulosis.  Hip pain presumably referred pain from hernia/diverticular dz??.  . Cervical spondylosis     Primarily C4-5 and C5-6--referred to [facility] 02/2011.    Past Surgical History  Procedure Laterality Date  . 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 Ankle fracture surgery      hardware still in both ankles (titanium)  . Inguinal hernia repair      Right side with mesh about 1993  . Elbow surgery      left---tendon  . Ankle surgery      Ankle tendon surgery    Family History:  Family History  Problem Relation Age of Onset  .  Alcohol abuse Mother   . Alcohol abuse Father   . Depression Daughter   . Paranoid behavior Daughter     Social History:  reports that he has never smoked. He does not have any smokeless tobacco history on file. He reports that he does not drink alcohol or use illicit drugs.  Additional Social History:  Alcohol / Drug Use Pain Medications: na Prescriptions: na Over the Counter: na History of alcohol / drug use?: No history of alcohol / drug abuse  CIWA:   COWS:    Allergies:  Allergies  Allergen Reactions  . Penicillins Hives    Home Medications:  Medications Prior to Admission  Medication Sig Dispense Refill  . buPROPion (WELLBUTRIN SR) 150 MG 12 hr tablet Take 150 mg by mouth 2 (two) times daily.       . clonazePAM (KLONOPIN) 1 MG tablet Take 1 mg by mouth at bedtime as needed for anxiety.       . Flaxseed, Linseed, (FLAX SEED OIL PO) Take 2 capsules by mouth 2 (two) times daily.      Marland Kitchen gabapentin (NEURONTIN) 300 MG capsule Take 600 mg by mouth 3 (three) times daily.      Marland Kitchen lamoTRIgine (LAMICTAL) 100 MG tablet Take 2 tablets (200 mg total) by mouth 2 (two) times daily.  30 tablet  2  . Multiple Vitamin (MULTIVITAMIN WITH MINERALS) TABS Take 1 tablet by mouth daily.      Marland Kitchen omeprazole (PRILOSEC) 40 MG capsule Take 40 mg by mouth 2 (two) times daily.      . traZODone (DESYREL) 100 MG tablet Take 100 mg by mouth at bedtime.      Marland Kitchen venlafaxine XR (EFFEXOR-XR) 150 MG 24 hr capsule Take 150 mg by mouth 2 (two) times daily.        OB/GYN Status:  No LMP for male patient.  General Assessment Data Location of Assessment: [facility] Living Arrangements: Alone Can pt return to current living arrangement?: Yes Admission Status: Voluntary Is patient capable of signing voluntary admission?: Yes Transfer from: Home Referral Source: Self/Family/Friend     Risk to self Suicidal Ideation: Yes-Currently Present Suicidal Intent: Yes-Currently Present Is patient at risk for suicide?: Yes Suicidal Plan?: Yes-Currently Present Specify Current Suicidal Plan: TO JUMP FROM P[place]ING [place]K [place] [address] Access to Means: Yes Specify Access to Suicidal Means: CAN DRIVE TO PARKING DECK What has been your use of drugs/alcohol within the last 12 months?: NONE Previous Attempts/Gestures: Yes How many times?: 5 Other Self Harm Risks: NONE Triggers for Past Attempts: Other personal contacts Intentional Self Injurious Behavior: None Family Suicide History: Yes (NEPHEW) Recent stressful life event(s): Recent negative physical changes (HOW MEDICATIONS KEEP HIM FROM FUNCTIONING) Persecutory voices/beliefs?: No Depression: Yes Depression Symptoms: Despondent;Loss of interest in usual  pleasures;Feeling worthless/self pity Substance abuse history and/or treatment for substance abuse?: No Suicide prevention information given to non-admitted patients: Not applicable  Risk to Others Homicidal Ideation: No Thoughts of Harm to Others: No Current Homicidal Intent: No Current Homicidal Plan: No Access to Homicidal Means: No Identified Victim: NA History of harm to others?: No Assessment of Violence: None Noted Violent Behavior Description:  (PT DRNIES ANY HISTORY OF VIOLENCE) Does patient have access to weapons?: No Criminal Charges Pending?: No Does patient have a court date: No  Psychosis Hallucinations: None noted Delusions: None noted  Mental Status Report Appear/Hygiene: Improved Eye Contact: Good Motor Activity: Freedom of movement Speech: Logical/coherent Level of Consciousness: Alert Mood: Depressed;Despair;Helpless;Sad Affect:  Depressed Anxiety Level: Minimal Thought Processes: Coherent;Relevant Judgement: Impaired Orientation: Person;Place;Time;Situation Obsessive Compulsive Thoughts/Behaviors: None  Cognitive Functioning Concentration: Normal Memory: Recent Intact;Remote Intact IQ: Average Insight: Poor Impulse Control: Poor Appetite: Good Weight Loss: 0 Weight Gain: 0 Sleep: No Change Total Hours of Sleep: 3 Vegetative Symptoms: None  ADLScreening [facility]) Patient's cognitive ability adequate to safely complete daily activities?: Yes Patient able to express need for assistance with ADLs?: Yes Independently performs ADLs?: Yes (appropriate for developmental age)  Abuse/Neglect [facility]) Physical Abuse: Denies Verbal Abuse: Denies Sexual Abuse: Denies  Prior Inpatient Therapy Prior Inpatient Therapy: Yes Prior Therapy Dates: 2012 Prior Therapy Facilty/Provider(s): [facility] Reason for Treatment: Suicidal ideation, depression  Prior Outpatient Therapy Prior Outpatient Therapy: Yes Prior Therapy Dates: Current Prior  Therapy Facilty/Provider([facility]): [name] Reason for Treatment: Depression  ADL Screening (condition at time of admission) Patient's cognitive ability adequate to safely complete daily activities?: Yes Patient able to express need for assistance with ADLs?: Yes Independently performs ADLs?: Yes (appropriate for developmental age) Weakness of Legs: None Weakness of Arms/Hands: None     Therapy Consults (therapy consults require a physician order) PT Evaluation Needed: No OT Evalulation Needed: No SLP Evaluation Needed: No Abuse/Neglect Assessment (Assessment to be complete while patient is alone) Physical Abuse: Denies Verbal Abuse: Denies Sexual Abuse: Denies Exploitation of patient/patient's resources: Denies Self-Neglect: Denies Values / Beliefs Cultural Requests During Hospitalization: None Spiritual Requests During Hospitalization: None Consults Spiritual Care Consult Needed: No Advance Directives (For Healthcare) Advance Directive: Patient does not have advance directive;Patient would not like information Pre-existing out of facility DNR order (yellow form or pink MOST form): No    Additional Information 1:1 In Past 12 Months?: Yes CIRT Risk: No Elopement Risk: No Does patient have medical clearance?: No     Disposition: PT ACCEPTED TO [facility] BY JAMISON LORD,NP TO DR RAVI.  Disposition Initial Assessment Completed for this Encounter: Yes Disposition of Patient: Inpatient treatment program Type of inpatient treatment program: Adult Type of outpatient treatment: Adult  On Site Evaluation by:   Reviewed with Physician:     Hattie Perch Winford 12/04/2012 4:30 PM

## 2012-12-04 NOTE — Progress Notes (Signed)
Patient ID: Sean Hill, male   DOB: 1940-11-24, 72 y.o.   MRN: [id number] D:  Patient admitted as a walk in.  He was brought in by his wife with worsening depression over the past couple of weeks.  He is followed by [name] for counseling in the outpatient department.  He is retired and states he has lost interest in most of the activities he used to enjoy.  States his mind races all the time and he does not sleep well.  Is also concerned about his memory and worries about developing Alzheimer's or dementia.  Lives with wife of 45 years and states they really do not have much in common any longer.  Sees [name], his therapist, as the most supportive person in his life at this time.  A:  Admission process completed.  Educated patient about groups and medications.  Oriented to the unit and to the group schedule.  Offered food and fluids.  Obtained order for no roommate due to patients extremely loud snoring.   R:  Pleasant and cooperative with the admission process.  Patient does admit to passive suicidal thoughts but agrees to seek out staff if feeling unsafe.  Safety precautions initiated for high fall risk.  Patient remains safe on the unit at this time.

## 2012-12-05 ENCOUNTER — Ambulatory Visit (HOSPITAL_COMMUNITY): Payer: Self-pay | Admitting: Psychiatry

## 2012-12-05 LAB — TSH: TSH: 1.55 u[IU]/mL (ref 0.350–4.500)

## 2012-12-05 MED ORDER — BUPROPION HCL ER (SR) 100 MG PO TB12
100.0000 mg | ORAL_TABLET | Freq: Two times a day (BID) | ORAL | Status: DC
  Administered 2012-12-06 – 2012-12-07 (×2): 100 mg via ORAL
  Filled 2012-12-05 (×6): qty 1

## 2012-12-05 MED ORDER — LAMOTRIGINE 100 MG PO TABS
100.0000 mg | ORAL_TABLET | Freq: Two times a day (BID) | ORAL | Status: DC
  Administered 2012-12-05 – 2012-12-07 (×4): 100 mg via ORAL
  Filled 2012-12-05 (×6): qty 1

## 2012-12-05 NOTE — [facility] Suicide Risk Assessment (Signed)
Suicide Risk Assessment  Admission Assessment     Nursing information obtained from:  Patient Demographic factors:  Male;Age 70 or older;Caucasian Current Mental Status:  Suicidal ideation indicated by patient Loss Factors:  Decline in physical health Historical Factors:  Prior suicide attempts;Family history of suicide;Family history of mental illness or substance abuse;Anniversary of important loss;Impulsivity Risk Reduction Factors:  Sense of responsibility to family;Living with another person, especially a relative;Positive therapeutic relationship;Positive coping skills or problem solving skills  CLINICAL FACTORS:   Depression:   Anhedonia Hopelessness Impulsivity Insomnia Severe  COGNITIVE FEATURES THAT CONTRIBUTE TO RISK:  Cognitively intact  SUICIDE RISK:   Mild:  Suicidal ideation of limited frequency, intensity, duration, and specificity.  There are no identifiable plans, no associated intent, mild dysphoria and related symptoms, good self-control (both objective and subjective assessment), few other risk factors, and identifiable protective factors, including available and accessible social support.  PLAN OF CARE: Adjust medications. Encourage attending groups and participating. Discharge when stable.  I certify that inpatient services furnished can reasonably be expected to improve the patient's condition.  Sean Hill 12/05/2012, 1:54 PM

## 2012-12-05 NOTE — Progress Notes (Signed)
Adult Psychoeducational Group Note  Date:  12/05/2012 Time:  8:43 PM  Group Topic/Focus:  Making Healthy Choices:   The focus of this group is to help patients identify negative/unhealthy choices they were using prior to admission and identify positive/healthier coping strategies to replace them upon discharge.  Participation Level:  Active  Participation Quality:  Supportive  Affect:  Appropriate  Cognitive:  Appropriate  Insight: Appropriate  Engagement in Group:  Supportive  Modes of Intervention:  Exploration  Additional Comments:  Sheron cooperated willingly in group.   Annell Greening Kramer 12/05/2012, 8:43 PM
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
## 2012-12-05 NOTE — Progress Notes (Signed)
Patient ID: Sean Hill, male   DOB: 01/10/1941, 72 y.o.   MRN: [id number] D: Patient in dayroom on approach. Pt presented with depressed mood and flat affect. Pt stated fraustration with medications and would like to taper off all of his medication. Pt stated "don't know why I'm still on all this medicines". Pt stated if the medicine is not adjusted he will sign a 72 hour release and do it himself. Pt denies SI/HI/AVH. Pt attended evening wrap up group and Interacted appropriately with peers. Cooperative with assessment. No acute distressed noted at this time.   A: Met with pt 1:1. Medications administered as prescribed. Writer encouraged pt to discuss feelings. Writer told pt medications has to be tapered slowly to help the body adjust. Pt encouraged to come to staff with any question or concerns.  R: Patient remains safe. He is complaint with medications and group programming. Continue current POC.

## 2012-12-05 NOTE — Tx Team (Signed)
Interdisciplinary Treatment Plan Update   Date Reviewed:  12/05/2012  Time Reviewed:  10:44 AM  Progress in Treatment:   Attending groups: Yes Participating in groups: Yes, Sean Hill is attending groups Taking medication as prescribed: Yes  Tolerating medication: Yes Family/Significant other contact made: No, but will ask for permission to contact wife Sean Hill understands diagnosis: Yes  Discussing Sean Hill identified problems/goals with staff: Yes Medical problems stabilized or resolved: Yes Denies suicidal/homicidal ideation: Yes Sean Hill has not harmed self or others: Yes  For review of initial/current Sean Hill goals, please see plan of care.  Estimated Length of Stay:  3-5 days  Reasons for Continued Hospitalization:  Anxiety Depression Medication stabilization Suicidal ideation  New Problems/Goals identified:    Discharge Plan or Barriers:   Home with outpatient follow up with [facility]  Additional Comments:  Sean Hill reports admitting to hospital with Bipolar Disorder and being on too many medications.  He endorses passive SI but able to contract for safety.  Sean Hill shared he does not want to be on any medications and is asking all meds be stopped.  Attendees:  Sean Hill:  12/05/2012 10:44 AM   Signature: Patrick North, MD 12/05/2012 10:44 AM  Signature:Tina Arlana Pouch, RN 12/05/2012 10:44 AM  Signature: Harold Barban, RN 12/05/2012 10:44 AM  Signature 12/05/2012 10:44 AM  Signature:   12/05/2012 10:44 AM  Signature:  Juline Patch, LCSW 12/05/2012 10:44 AM  Signature: Silverio Decamp, PMH-NP 12/05/2012 10:44 AM  Signature: Liliane Bade, BSW 12/05/2012 10:44 AM  Signature: 12/05/2012 10:44 AM  Signature:    Signature:    Signature:      Scribe for Treatment Team:   Juline Patch,  12/05/2012 10:44 AM \
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
## 2012-12-05 NOTE — Progress Notes (Signed)
Recreation Therapy Notes  Date: 03.19.2014 Time: 3:10pm Location: [facility] Art Room      Group Topic/Focus: Goal Setting  Participation Level: Did not attend   Jearl Klinefelter, LRT/CTRS        Jearl Klinefelter 12/05/2012 4:21 PM

## 2012-12-05 NOTE — Progress Notes (Signed)
[facility] LCSW Group Therapy  Emotional Regulation 1:15 -2:30  12/05/2012 3:15 PM  Type of Therapy:  Group Therapy  Participation Level:  Minimal  Participation Quality:  Appropriate  Affect:  Depressed and Lethargic  Cognitive:  Appropriate  Insight:  Developing/Improving  Engagement in Therapy:  Developing/Improving  Modes of Intervention:  Discussion, Problem-solving, Rapport Building and Support  Summary of Progress/Problems:   Patient remained in group but was very drowsy during group.   Wynn Banker 12/05/2012, 3:15 PM

## 2012-12-05 NOTE — Progress Notes (Signed)
[facility] LCSW Aftercare Discharge Planning Group Note  12/05/2012 10:14 AM  Participation Quality:  Appropriate  Affect:  Appropriate and Depressed  Cognitive:  Appropriate  Insight:  Developing/Improving  Engagement in Group:  Developing/Improving  Modes of Intervention:  Exploration, Problem-solving, Rapport Building and Support  Summary of Progress/Problems:  Patient reports admitting to hospital due to being on so many medications it leaves him feeling like a zombie.  Patient reports he wants to come off all medications.  He reports having home, transportation and access to medication.  He is followed outpatient by [facility].   Sean Hill 12/05/2012, 10:14 AM

## 2012-12-05 NOTE — Progress Notes (Signed)
D: Patient appropriate and cooperative with staff. Patient's affect/mood is anxious. He reported on the self inventory sheet that his sleep is poor, energy level is low, and ability to pay attention is poor. Patient rated feelings of hopelessness "3". Attending groups. Earlier this morning he stated, "I want to get off all these meds that I've been taking because that's why I'm in the shape I'm in now".   A: Support and encouragement provided to patient. Scheduled medications administered per MD orders. Maintain Q15 minute checks for safety.  R: Patient receptive. Denies SI/HI/AVH. Patient remains safe.

## 2012-12-05 NOTE — Progress Notes (Addendum)
Recreation Therapy Notes  Date: 03.19.2014 Time: 2:50pm Location: [address] Day Room      Group Topic/Focus: Musician (AAA/T)  Participation Level: Did not attend   [employer], LRT/CTRS  Jearl Klinefelter 12/05/2012 4:12 PM

## 2012-12-06 DIAGNOSIS — F3130 Bipolar disorder, current episode depressed, mild or moderate severity, unspecified: Secondary | ICD-10-CM

## 2012-12-06 NOTE — Progress Notes (Signed)
D: Patient appropriate and cooperative with staff. Patient's appears to be very preoccupied with being tapered off of his current medications. He verbalized that he's feeling sad and discouraged because he believes that he may have been misunderstood about his reasons for voluntarily being admitted here. Patient stated that "I'm not depressed, suicidal, or anything else, I feel better today than I have in 8 years and I don't need to be on all these medications that I've been taking all this time". He reported on the self inventory sheet that his sleep/appetite is poor, energy level is low and ability to pay attention is improving. Patient rated depression "8" and feelings of hopelessness "7".  A: Support and encouragement provided to patient. Writer informed MD of the above conversation. Administered scheduled medications per ordering MD. Monitor Q15 minute checks for safety.  R: Patient receptive. Denies SI/HI/AVH. Patient remains safe.

## 2012-12-06 NOTE — Progress Notes (Signed)
[facility] LCSW Aftercare Discharge Planning Group Note  12/06/2012 10:47 AM  Participation Quality:  Appropriate  Affect:  Appropriate and Depressed  Cognitive:  Appropriate  Insight: Engaged  Engagement in Group:  Engaged  Modes of Intervention:  Exploration, Problem-solving, Rapport Building and Support  Summary of Progress/Problems:  Patient reports being better today.  He denies SI/HI and rates all symptoms zero.  Patient shared he hopes to discharge as he came into the hospital to get off all meds.  He will continue to follow up with outpatient therapist.   Wynn Banker 12/06/2012, 10:47 AM

## 2012-12-06 NOTE — Progress Notes (Signed)
Adult Psychoeducational Group Note  Date:  12/06/2012 Time:  6:44 PM  Group Topic/Focus:  Overcoming Stress:   The focus of this group is to define stress and help patients assess their triggers.  Participation Level:  Minimal  Participation Quality:  Appropriate and Attentive  Affect:  Appropriate  Cognitive:  Appropriate  Insight: Appropriate  Engagement in Group:  Engaged  Modes of Intervention:  Activity, Discussion, Socialization and Support  Additional Comments:  [name] attended group and shared at times. Patient defined stress in own terms. Afterwards, patient explained what ways of good and bad stress. Patient then, partnered up with a peer and completed the stress interview in workbooks and afterwards shared what partner expressed. Managing stress ideas in workbook was reviewed with patient to complete during self-reflection time.   Sean Hill 12/06/2012, 6:44 PM

## 2012-12-06 NOTE — Progress Notes (Signed)
[facility] INPATIENT:  Family/Significant Other Suicide Prevention Education  Suicide Prevention Education:  Patient Refusal for Family/Significant Other Suicide Prevention Education: The patient Sean Hill has refused to provide written consent for family/significant other to be provided Family/Significant Other Suicide Prevention Education during admission and/or prior to discharge.  Physician notified.  Patient refused to give consent to contact wife to provide suicide prevention education and rescinded previous consent provided for contact with her.  Wynn Banker 12/06/2012, 10:50 AM

## 2012-12-06 NOTE — Progress Notes (Signed)
Pt attended Karaoke group; did not participate but was attentive and supportive of peers.  

## 2012-12-06 NOTE — Progress Notes (Signed)
Patient ID: PATRICIO POPWELL, male   DOB: Jul 06, 1941, 72 y.o.   MRN: [id number] D: Pt presented with depressed mood and flat affect. When asked how pt is doing he stated "great". Pt seem  more excited about his medications tapering after speaking to him doctor. Pt stated he is "looking forward to living his life without bipolar". Pt denies SI/HI/AVH and pain. Pt attended evening karaoke group and Interacted appropriately with peers. Cooperative with assessment. No acute distressed noted at this time.   A: Met with pt 1:1. Medications administered as prescribed. Writer encouraged pt to discuss feelings. Pt encouraged to come to staff with any question or concerns.  R: Patient remains safe. He is complaint with medications and group programming. Continue current POC.

## 2012-12-06 NOTE — Progress Notes (Signed)
[facility] MD Progress Note  12/06/2012 11:36 AM Sean Hill  MRN:  [id number] Subjective:  Sean Hill reports "feeling better today than ever." He was feeling very anxious regarding the status of his medication taper. Patient did not realize that his medication dosages have been decreased as he desires. Patient stated "I was getting anxious because I didn't feel like I was being heard." Tricia discussed how he had been feeling on the medications stating "I could not put words together, walk right and felt foggy in my mind. I want to get my life together and keep crossing items off my bucket list. Maybe I can go to [place] this year." Rates his depression at one and denies feeling anxious after speaking with provider. The patient is attending groups and interacting with peers on the unit.   Diagnosis:   Axis I: Bipolar, Depressed Axis II: Deferred Axis III:  Past Medical History  Diagnosis Date  . Bipolar disorder     Has psychiatrist.  BH admission (suicidal) 07/20/11.  Marland Kitchen Hyperlipemia     Intol of meds: GI side effects  . 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  No hx of MRSA infection.  . 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 Nephrolithiasis   . GERD (gastroesophageal reflux disease)   . Inguinal hernia 02/25/11    Left sided hernia with fat  . Hip pain, left 2011-2012    Left hip and groin: MRI pelvis and left hip 02/18/11 showed NORMAL hip, with small inguinal hernia containing fat and sigmoid diverticulosis.  Hip pain presumably referred pain from hernia/diverticular dz??.  . Cervical spondylosis     Primarily C4-5 and C5-6--referred to [facility] 02/2011.  . Depression   . Bipolar disorder, current episode depressed, moderate   . Sleep apnea   . Asthma    Axis IV: other psychosocial or environmental problems Axis V: 51-60 moderate symptoms  ADL's:  Intact  Sleep: Fair  Appetite:  Fair  Suicidal  Ideation:  Denies Homicidal Ideation:  Denies  AEB (as evidenced by):  Psychiatric Specialty Exam: Review of Systems  Constitutional: Negative.   Eyes: Negative.   Respiratory: Negative.   Cardiovascular: Positive for palpitations (Occasional palpitation patient associates with no longer being on effexor).  Gastrointestinal: Positive for diarrhea (Reports two episodes per day since medications being tapered).  Genitourinary: Negative.   Musculoskeletal: Negative.   Skin: Negative.   Neurological: Positive for headaches.  Endo/Heme/Allergies: Negative.   Psychiatric/Behavioral: Positive for depression. Negative for suicidal ideas and hallucinations. The patient is nervous/anxious and has insomnia.     Blood pressure 129/84, pulse 85, temperature 97.9 F (36.6 C), temperature source Oral, resp. rate 16, height 5\' 8"  (1.727 m), weight 116.121 kg (256 lb).Body mass index is 38.93 kg/(m^2).  General Appearance: Casual  Eye Contact::  Good  Speech:  Clear and Coherent  Volume:  Normal  Mood:  Angry and Anxious  Affect:  Appropriate and Full Range  Thought Process:  Goal Directed and Intact  Orientation:  Full (Time, Place, and Person)  Thought Content:  WDL  Suicidal Thoughts:  No  Homicidal Thoughts:  No  Memory:  Immediate;   Good Recent;   Good Remote;   Good  Judgement:  Fair  Insight:  Good  Psychomotor Activity:  Normal  Concentration:  Fair  Recall:  Good  Akathisia:  No  Handed:  Right  AIMS (if indicated):  Assets:  Communication Skills Desire for Improvement Financial Resources/Insurance Housing Intimacy Leisure Time Physical Health Resilience Social Support  Sleep:  Number of Hours: 5.25   Current Medications: Current Facility-Administered Medications  Medication Dose Route Frequency Provider Last Rate Last Dose  . acetaminophen (TYLENOL) tablet 650 mg  650 mg Oral Q6H PRN Cleotis Nipper, MD      . alum & mag hydroxide-simeth (MAALOX/MYLANTA)  200-200-20 MG/5ML suspension 30 mL  30 mL Oral Q4H PRN Cleotis Nipper, MD      . buPROPion [facility] SR) 12 hr tablet 100 mg  100 mg Oral BID Jaquarious Grey, MD      . clonazePAM (KLONOPIN) tablet 0.5 mg  0.5 mg Oral QHS PRN Cleotis Nipper, MD   0.5 mg at 12/05/12 2235  . gabapentin (NEURONTIN) capsule 600 mg  600 mg Oral TID Cleotis Nipper, MD   600 mg at 12/06/12 4132  . lamoTRIgine (LAMICTAL) tablet 100 mg  100 mg Oral BID Quantavis Obryant, MD   100 mg at 12/06/12 0813  . magnesium hydroxide (MILK OF MAGNESIA) suspension 30 mL  30 mL Oral Daily PRN Cleotis Nipper, MD      . multivitamin with minerals tablet 1 tablet  1 tablet Oral Daily Cleotis Nipper, MD   1 tablet at 12/06/12 0813  . pantoprazole (PROTONIX) EC tablet 80 mg  80 mg Oral Daily Cleotis Nipper, MD   80 mg at 12/06/12 0813  . traZODone (DESYREL) tablet 100 mg  100 mg Oral QHS PRN Cleotis Nipper, MD   100 mg at 12/05/12 2235    Lab Results:  Results for orders placed during the hospital encounter of 12/04/12 (from the past 48 hour(s))  LITHIUM LEVEL     Status: Abnormal   Collection Time    12/04/12  8:06 PM      Result Value Range   Lithium Lvl 0.15 (*) 0.80 - 1.40 mEq/L  CBC     Status: Abnormal   Collection Time    12/04/12  8:06 PM      Result Value Range   WBC 8.2  4.0 - 10.5 K/uL   RBC 5.83 (*) 4.22 - 5.81 MIL/uL   Hemoglobin 16.7  13.0 - 17.0 g/dL   HCT 44.0  10.2 - 72.5 %   MCV 83.5  78.0 - 100.0 fL   MCH 28.6  26.0 - 34.0 pg   MCHC 34.3  30.0 - 36.0 g/dL   RDW 36.6  44.0 - 34.7 %   Platelets 300  150 - 400 K/uL  COMPREHENSIVE METABOLIC PANEL     Status: Abnormal   Collection Time    12/04/12  8:06 PM      Result Value Range   Sodium 135  135 - 145 mEq/L   Potassium 3.6  3.5 - 5.1 mEq/L   Chloride 97  96 - 112 mEq/L   CO2 26  19 - 32 mEq/L   Glucose, Bld 96  70 - 99 mg/dL   BUN 9  6 - 23 mg/dL   Creatinine, Ser 4.25  0.50 - 1.35 mg/dL   Calcium 9.3  8.4 - 95.6 mg/dL   Total Protein 7.0  6.0 - 8.3 g/dL    Albumin 3.9  3.5 - 5.2 g/dL   AST 30  0 - 37 U/L   ALT 33  0 - 53 U/L   Alkaline Phosphatase 125 (*) 39 - 117 U/L   Total Bilirubin 0.4  0.3 - 1.2 mg/dL   GFR calc non Af Amer 71 (*) >90 mL/min   GFR calc Af Amer 83 (*) >90 mL/min   Comment:            The eGFR has been calculated     using the CKD EPI equation.     This calculation has not been     validated in all clinical     situations.     eGFR's persistently     <90 mL/min signify     possible Chronic Kidney Disease.  TSH     Status: None   Collection Time    12/04/12  8:06 PM      Result Value Range   TSH 1.550  0.350 - 4.500 uIU/mL    Physical Findings: AIMS: Facial and Oral Movements Muscles of Facial Expression: None, normal Lips and Perioral Area: None, normal Jaw: None, normal Tongue: None, normal,Extremity Movements Upper (arms, wrists, hands, fingers): Moderate Lower (legs, knees, ankles, toes): Minimal, Trunk Movements Neck, shoulders, hips: None, normal, Overall Severity Severity of abnormal movements (highest score from questions above): Minimal Incapacitation due to abnormal movements: Minimal Patient's awareness of abnormal movements (rate only patient's report): Aware, moderate distress, Dental Status Current problems with teeth and/or dentures?: No Does patient usually wear dentures?: No  CIWA:    COWS:     Treatment Plan Summary: Daily contact with patient to assess and evaluate symptoms and progress in treatment Medication management  Plan:Continue crisis management and stabilization.  Medication management: Patient's Wellbutrin has been decreased to 100 mg bid and Lamictal decreased to 100 mg bid. Effexor was discontinued (per pt he had stopped taking effexor recently). Patient appears to be experiencing very mild withdrawal symptoms but is tolerating the taper well.  Encouraged patient to attend groups and participate in group counseling sessions and activities.  Discharge plan in progress.  Patient will continue to be followed by [facility] outpatient.  Address health issues: Vital signs reviewed and currently stable.  Continue current treatment plan.   Medical Decision Making Problem Points:  Established problem, stable/improving (1) and Review of psycho-social stressors (1) Data Points:  Review of medication regiment & side effects (2) Review of new medications or change in dosage (2)  I certify that inpatient services furnished can reasonably be expected to improve the patient's condition.   Fransisca Kaufmann ANN NP-C 12/06/2012, 11:36 AM

## 2012-12-06 NOTE — Progress Notes (Addendum)
[facility] LCSW Group Therapy                 [facility] [employer] [facility] 1:15 -2:30 PM   12/06/2012 3:19 PM  Type of Therapy:  Group Therapy  Participation Level:  Minimal  Participation Quality:  Appropriate and Attentive  Affect:  Appropriate  Cognitive:  Appropriate  Insight:  Improving  Engagement in Therapy:  Improving  Modes of Intervention:  Discussion, Education, Exploration, Problem-solving, Rapport Building and Support  Summary of Progress/Problems:  Patient listened attentively to speaker from [employer].  He was focused on how having a diagnosed leads individuals to be put into a category and given a bunch of medicaitons.  Wynn Banker 12/06/2012, 3:19 PM

## 2012-12-07 MED ORDER — BUPROPION HCL ER (SR) 100 MG PO TB12
100.0000 mg | ORAL_TABLET | Freq: Every day | ORAL | Status: DC
  Administered 2012-12-08 – 2012-12-10 (×2): 100 mg via ORAL
  Filled 2012-12-07 (×4): qty 1

## 2012-12-07 MED ORDER — LAMOTRIGINE 100 MG PO TABS
100.0000 mg | ORAL_TABLET | Freq: Every day | ORAL | Status: DC
  Administered 2012-12-08 – 2012-12-10 (×3): 100 mg via ORAL
  Filled 2012-12-07 (×4): qty 1

## 2012-12-07 MED ORDER — GABAPENTIN 300 MG PO CAPS
300.0000 mg | ORAL_CAPSULE | Freq: Three times a day (TID) | ORAL | Status: DC
  Administered 2012-12-07 – 2012-12-10 (×10): 300 mg via ORAL
  Filled 2012-12-07 (×12): qty 1

## 2012-12-07 NOTE — Progress Notes (Signed)
Adult Psychoeducational Group Note  Date:  12/07/2012 Time:  10:45 AM  Group Topic/Focus:  Healthy Communication:   The focus of this group is to discuss communication, barriers to communication, as well as healthy ways to communicate with others.  Participation Level:  Active  Participation Quality:  Attentive  Affect:  Appropriate  Cognitive:  Appropriate  Insight: Improving  Engagement in Group:  Engaged  Modes of Intervention:  Discussion and Education  Additional Comments:  Pts goal is to get medication adjusted and get life back to normal. Pt participated in group and was active.  Dane Bloch T 12/07/2012, 10:45 AM

## 2012-12-07 NOTE — Progress Notes (Signed)
[facility] MD Progress Note  12/07/2012 11:50 AM Sean Hill  MRN:  [id number] Subjective:  Patient tolerating the taper in Lamictal and Bupropion. States he is able to think more clearly, continues to feel depressed. Not sleeping well.    Diagnosis:   Axis I: Major Depression, Recurrent severe Axis II: Deferred Axis III:  Past Medical History  Diagnosis Date  . Bipolar disorder     Has psychiatrist.  BH admission (suicidal) 07/20/11.  Marland Kitchen Hyperlipemia     Intol of meds: GI side effects  . 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  No hx of MRSA infection.  . 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 Nephrolithiasis   . GERD (gastroesophageal reflux disease)   . Inguinal hernia 02/25/11    Left sided hernia with fat  . Hip pain, left 2011-2012    Left hip and groin: MRI pelvis and left hip 02/18/11 showed NORMAL hip, with small inguinal hernia containing fat and sigmoid diverticulosis.  Hip pain presumably referred pain from hernia/diverticular dz??.  . Cervical spondylosis     Primarily C4-5 and C5-6--referred to [facility] 02/2011.  . Depression   . Bipolar disorder, current episode depressed, moderate   . Sleep apnea   . Asthma    Axis IV: other psychosocial or environmental problems Axis V: 41-50 serious symptoms  ADL's:  Intact  Sleep: Poor  Appetite:  Fair   Psychiatric Specialty Exam: Review of Systems  Constitutional: Negative.   HENT: Negative.   Eyes: Negative.   Respiratory: Negative.   Cardiovascular: Negative.   Gastrointestinal: Negative.   Genitourinary: Negative.   Musculoskeletal: Negative.   Skin: Negative.   Neurological: Negative.   Endo/Heme/Allergies: Negative.   Psychiatric/Behavioral: Positive for depression. The patient is nervous/anxious and has insomnia.     Blood pressure 153/83, pulse 99, temperature 99.4 F (37.4 C), temperature source Oral, resp. rate 20, height  5\' 8"  (1.727 m), weight 116.121 kg (256 lb).Body mass index is 38.93 kg/(m^2).  General Appearance: Casual  Eye Contact::  Fair  Speech:  Slow  Volume:  Decreased  Mood:  Anxious, Depressed and Dysphoric  Affect:  Constricted and Depressed  Thought Process:  Coherent  Orientation:  Full (Time, Place, and Person)  Thought Content:  WDL and Rumination  Suicidal Thoughts:  No  Homicidal Thoughts:  No  Memory:  Immediate;   Fair Recent;   Fair Remote;   Fair  Judgement:  Fair  Insight:  Present  Psychomotor Activity:  Decreased  Concentration:  Fair  Recall:  Fair  Akathisia:  No  Handed:  Right  AIMS (if indicated):     Assets:  Communication Skills Desire for Improvement Housing Social Support  Sleep:  Number of Hours: 5.25   Current Medications: Current Facility-Administered Medications  Medication Dose Route Frequency Provider Last Rate Last Dose  . acetaminophen (TYLENOL) tablet 650 mg  650 mg Oral Q6H PRN Cleotis Nipper, MD   650 mg at 12/06/12 2306  . alum & mag hydroxide-simeth (MAALOX/MYLANTA) 200-200-20 MG/5ML suspension 30 mL  30 mL Oral Q4H PRN Cleotis Nipper, MD      . Melene Muller ON 12/08/2012] buPROPion [facility] SR) 12 hr tablet 100 mg  100 mg Oral Daily Antwaine Boomhower, MD      . clonazePAM (KLONOPIN) tablet 0.5 mg  0.5 mg Oral QHS PRN Cleotis Nipper, MD   0.5 mg at 12/05/12 2235  .  gabapentin (NEURONTIN) capsule 300 mg  300 mg Oral TID Shunda Rabadi, MD      . Melene Muller ON 12/08/2012] lamoTRIgine (LAMICTAL) tablet 100 mg  100 mg Oral Daily Story Vanvranken, MD      . magnesium hydroxide (MILK OF MAGNESIA) suspension 30 mL  30 mL Oral Daily PRN Cleotis Nipper, MD      . multivitamin with minerals tablet 1 tablet  1 tablet Oral Daily Cleotis Nipper, MD   1 tablet at 12/07/12 0754  . pantoprazole (PROTONIX) EC tablet 80 mg  80 mg Oral Daily Cleotis Nipper, MD   80 mg at 12/07/12 0755  . traZODone (DESYREL) tablet 100 mg  100 mg Oral QHS PRN Cleotis Nipper, MD   100 mg at 12/06/12  2306    Lab Results: No results found for this or any previous visit (from the past 48 hour(s)).  Physical Findings: AIMS: Facial and Oral Movements Muscles of Facial Expression: None, normal Lips and Perioral Area: None, normal Jaw: None, normal Tongue: None, normal,Extremity Movements Upper (arms, wrists, hands, fingers): Moderate Lower (legs, knees, ankles, toes): Minimal, Trunk Movements Neck, shoulders, hips: None, normal, Overall Severity Severity of abnormal movements (highest score from questions above): Minimal Incapacitation due to abnormal movements: Minimal Patient's awareness of abnormal movements (rate only patient's report): Aware, moderate distress, Dental Status Current problems with teeth and/or dentures?: No Does patient usually wear dentures?: No  CIWA:    COWS:     Treatment Plan Summary: Daily contact with patient to assess and evaluate symptoms and progress in treatment Medication management  Plan: Decrease Lamictal to 100mg , decrease wellbutrin to 100mg . Counselled patient about the taper of medications and to watch for emerging mood symptoms. Encouraged to attend groups.  Medical Decision Making Problem Points:  Established problem, stable/improving (1), Review of last therapy session (1) and Review of psycho-social stressors (1) Data Points:  Review of medication regiment & side effects (2) Review of new medications or change in dosage (2)  I certify that inpatient services furnished can reasonably be expected to improve the patient's condition.   Vian Fluegel 12/07/2012, 11:50 AM

## 2012-12-07 NOTE — Progress Notes (Signed)
D: Patient cooperative with staff. Patient's affect/mood is appropriate to circumstance. He reported on the self inventory sheet that his appetite is improving, energy level is normal and ability to pay attention is good. Patient rated depression and feelings of hopelessness "1".  A: Support and encouragement provided to patient. Scheduled medications administered per MD orders. Maintain Q15 minute checks for safety.  R: Patient receptive. Denies SI/HI/AVH. Patient remains safe on the unit.

## 2012-12-07 NOTE — Progress Notes (Signed)
Adult Psychoeducational Group Note  Date:  12/07/2012 Time:  2:49 PM  Group Topic/Focus:  Early Warning Signs:   The focus of this group is to help patients identify signs or symptoms they exhibit before slipping into an unhealthy state or crisis.  Participation Level:  Active  Participation Quality:  Appropriate  Affect:  Appropriate  Cognitive:  Alert and Appropriate  Insight: Appropriate  Engagement in Group:  Engaged  Modes of Intervention:  Discussion, Education, Exploration and Support  Additional Comments:  Pt participated in icebreaker at outset of group by sharing that skydiving and bungy jumping are his favorite activities.  Group discussed that changes which occur in one's life which are the early warning signs of an ensuing crisis, including behavior, attitude, feeling, and thought changes. Group discussed that many of these changes may be noticed by one's loves ones. Staff noted the importance of completing a "Relapse Prevention Plan" in order to address early warning signs effectively in the future and avert crises. Pt shared that he feels that "medications are only 25% of what it takes" to prevent relapse, noting the importance of personal accountability and impulse-control.   Reinaldo Raddle K 12/07/2012, 2:49 PM

## 2012-12-07 NOTE — Progress Notes (Signed)
[facility] LCSW Aftercare Discharge Planning Group Note  12/07/2012 10:25 AM  Participation Quality:  Appropriate  Affect:  Appropriate  Cognitive:  Appropriate  Insight:  Engaged  Engagement in Group:  Engaged  Modes of Intervention:  Education, Exploration, Dentist, Rapport Building and Support  Summary of Progress/Problems:  Patient reports reports being much better and denies SI/HI.  He rates all symptoms at zero.  Patient is hopeful to discharge home today.  Wynn Banker 12/07/2012, 10:25 AM

## 2012-12-07 NOTE — Progress Notes (Signed)
[facility] LCSW Group Therapy                 Feelings Around Relapse 1:15 -2:30 PM   12/07/2012 3:29 PM  Type of Therapy:  Group Therapy  Participation Level:  Minimal  Participation Quality:  Appropriate and Attentive  Affect:  Appropriate  Cognitive:  Appropriate  Insight:  Improving  Engagement in Therapy:  Improving  Modes of Intervention:  Discussion, Education, Exploration, Problem-solving, Rapport Building and Support  Summary of Progress/Problems:  Patient advised relapsing would mean taking other people's advised without taking time to examine the evidence.  He shared he plans to stay active and be true to himself.  Wynn Banker 12/07/2012, 3:29 PM

## 2012-12-07 NOTE — Tx Team (Signed)
Interdisciplinary Treatment Plan Update   Date Reviewed:  12/07/2012  Time Reviewed:  9:36 AM  Progress in Treatment:   Attending groups: Yes Participating in groups: Yes, patient is attending groups Taking medication as prescribed: Yes  Tolerating medication: Yes Family/Significant other contact made: No.  Patient declined allowing contact with wife Patient understands diagnosis: Yes  Discussing patient identified problems/goals with staff: Yes Medical problems stabilized or resolved: Yes Denies suicidal/homicidal ideation: Yes Patient has not harmed self or others: Yes  For review of initial/current patient goals, please see plan of care.  Estimated Length of Stay:  1 day  Reasons for Continued Hospitalization:  Medication stabilization   New Problems/Goals identified:    Discharge Plan or Barriers:   Home with outpatient follow up with therapist.    Additional Comments:  Patient reports being much better and hopes to discharge home today.  He denies SI/HI and rates all symptoms at zero.  Attendees:  Patient:  12/07/2012 9:36 AM   Signature: Patrick North, MD 12/07/2012 9:36 AM  Signature:Jeanie Brooke Dare, RN 12/07/2012 9:36 AM  Signature: Harold Barban, RN 12/07/2012 9:36 AM  Signature 12/07/2012 9:36 AM  Signature:   12/07/2012 9:36 AM  Signature:  Juline Patch, LCSW 12/07/2012 9:36 AM  Signature: Silverio Decamp, PMH-NP 12/07/2012 9:36 AM  Signature: Liliane Bade, BSW 12/07/2012 9:36 AM  Signature: 12/07/2012 9:36 AM  Signature:    Signature:    Signature:      Scribe for Treatment Team:   Juline Patch,  12/07/2012 9:36 AM \

## 2012-12-07 NOTE — Progress Notes (Signed)
[facility] Group Notes:  (Nursing/MHT/Case Management/Adjunct)  Date:  12/07/2012  Time:  2000  Type of Therapy:  Psychoeducational Skills  Participation Level:  Active  Participation Quality:  Appropriate  Affect:  Appropriate  Cognitive:  Appropriate  Insight:  Improving  Engagement in Group:  Engaged  Modes of Intervention:  Education  Summary of Progress/Problems: The patient described his day as having been "great". He explained to the group that he learned that he is no longer Bipolar. He also verbalized that he feels better as the doctor reduces his medication. His goal for tomorrow is to work on having his medication eliminated.   Ankit Degregorio S 12/07/2012, 9:00 PM

## 2012-12-08 NOTE — Progress Notes (Signed)
Writer spoke with patient and he reports having had a better day and has been thinking positive. He reports starting to feel withdrawals from his medication, Effexor. He reports having cold sweats, queasy stomach and palpitations which he reports having all these before being admitted. He attended group this evening and participated. He reports that his goal is to be discharged on Monday. Patient denies si/hi/a/v hallucinations. Support and encouragement offered, safety maintained on unit with 15 min checks, will continue to monitor.

## 2012-12-08 NOTE — Progress Notes (Signed)
Patient ID: Sean Hill, male   DOB: [birth year], 72 y.o.   MRN: [id number] D: Patient continues to progress on medication tapering. Pt stated he "feels great". Pt stated his two daughters came to visit and he had an "intelligent conversation" with them. Pt stated his mind is clear and is looking forward to discharge on Monday. Pt mood/affect is depressed. Pt denies SI/HI/AVH and pain. Pt attended evening wrap up group and Interacted appropriately with peers. Cooperative with assessment. No acute distressed noted at this time.   A: Met with pt 1:1. Medications administered as prescribed. Writer encouraged pt to discuss feelings. Pt encouraged to come to staff with any question or concerns.  R: Patient remains safe. He is complaint with medications and group programming. Continue current POC.

## 2012-12-08 NOTE — Progress Notes (Signed)
Goals Group Note  Date: 12/08/2012 Time: 0915  Group Topic/Focus:  Identifying  Goals :    The focus of this group is to help patients identify their personal goals they want to  Strive for, to orinet them to their sat. Patient Workbooks and to motivate them to make positive change in their lives. Participation Level:  Active  Participation Quality: good  Affect: flat  Cognitive: minimal   Insight:  good  Engagement in Group: engaged  Additional Comments:   PD RN BC [phone]

## 2012-12-08 NOTE — Progress Notes (Signed)
[facility] MD Progress Note  12/08/2012 2:47 PM Sean Hill  MRN:  [id number] Subjective:  Sean Hill continues to feel better as his medication taper progresses. Patient stated during interview "I can now see the light at the end of the tunnel. I think I was improperly labeled with Bipolar eight years ago." He enjoyed a visit from his family yesterday. He continues to talk very positively about his future. Denies feeling depressed. Rates anxiety at a two. The patient attributes this to not being able to leave the building when he wants stating "How would you feel if you were held in a room for three days?" He reports that he has been using meditation as a coping skill during difficult times. Sean Hill is attending groups on the unit and interacting with peers. He is hopeful about being discharged from the hospital on Monday.  Diagnosis:   Axis I: Major Depression, Recurrent severe Axis II: Deferred Axis III:  Past Medical History  Diagnosis Date  . Bipolar disorder     Has psychiatrist.  BH admission (suicidal) 07/20/11.  Marland Kitchen Hyperlipemia     Intol of meds: GI side effects  . 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  No hx of MRSA infection.  . 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 Nephrolithiasis   . GERD (gastroesophageal reflux disease)   . Inguinal hernia 02/25/11    Left sided hernia with fat  . Hip pain, left 2011-2012    Left hip and groin: MRI pelvis and left hip 02/18/11 showed NORMAL hip, with small inguinal hernia containing fat and sigmoid diverticulosis.  Hip pain presumably referred pain from hernia/diverticular dz??.  . Cervical spondylosis     Primarily C4-5 and C5-6--referred to [facility] 02/2011.  . Depression   . Bipolar disorder, current episode depressed, moderate   . Sleep apnea   . Asthma    Axis IV: other psychosocial or environmental problems Axis V: 51-60 moderate symptoms  ADL's:   Intact  Sleep: Poor  Appetite:  Fair  Suicidal Ideation:  Denies Homicidal Ideation:  Denies AEB (as evidenced by):  Psychiatric Specialty Exam: Review of Systems  Constitutional: Negative.   HENT: Negative.   Eyes: Negative.   Respiratory: Negative.   Cardiovascular: Negative.   Gastrointestinal: Positive for diarrhea (Reports loose stools as possible effect of medication taper. ).  Genitourinary: Negative.   Musculoskeletal: Negative.   Skin: Negative.   Neurological: Negative.   Endo/Heme/Allergies: Negative.   Psychiatric/Behavioral: Positive for depression. Negative for suicidal ideas, hallucinations and substance abuse. The patient is nervous/anxious and has insomnia.     Blood pressure 148/69, pulse 92, temperature 97.6 F (36.4 C), temperature source Oral, resp. rate 16, height 5\' 8"  (1.727 m), weight 116.121 kg (256 lb).Body mass index is 38.93 kg/(m^2).  General Appearance: Casual  Eye Contact::  Good  Speech:  Clear and Coherent  Volume:  Normal  Mood:  Optimistic  Affect:  Appropriate  Thought Process:  Goal Directed and Linear  Orientation:  Full (Time, Place, and Person)  Thought Content:  WDL  Suicidal Thoughts:  No  Homicidal Thoughts:  No  Memory:  Immediate;   Good Recent;   Good Remote;   Good  Judgement:  Good  Insight:  Good  Psychomotor Activity:  Normal  Concentration:  Good  Recall:  Good  Akathisia:  No  Handed:  Right  AIMS (if indicated):  Assets:  Communication Skills Desire for Improvement Financial Resources/Insurance Housing Intimacy Leisure Time Resilience Social Support  Sleep:  Number of Hours: 4   Current Medications: Current Facility-Administered Medications  Medication Dose Route Frequency Provider Last Rate Last Dose  . acetaminophen (TYLENOL) tablet 650 mg  650 mg Oral Q6H PRN Cleotis Nipper, MD   650 mg at 12/07/12 1607  . alum & mag hydroxide-simeth (MAALOX/MYLANTA) 200-200-20 MG/5ML suspension 30 mL  30 mL  Oral Q4H PRN Cleotis Nipper, MD      . buPROPion [facility] SR) 12 hr tablet 100 mg  100 mg Oral Daily Himabindu Ravi, MD   100 mg at 12/08/12 0843  . clonazePAM (KLONOPIN) tablet 0.5 mg  0.5 mg Oral QHS PRN Cleotis Nipper, MD   0.5 mg at 12/08/12 0433  . gabapentin (NEURONTIN) capsule 300 mg  300 mg Oral TID Himabindu Ravi, MD   300 mg at 12/08/12 1202  . lamoTRIgine (LAMICTAL) tablet 100 mg  100 mg Oral Daily Himabindu Ravi, MD   100 mg at 12/08/12 0843  . magnesium hydroxide (MILK OF MAGNESIA) suspension 30 mL  30 mL Oral Daily PRN Cleotis Nipper, MD      . multivitamin with minerals tablet 1 tablet  1 tablet Oral Daily Cleotis Nipper, MD   1 tablet at 12/08/12 0843  . pantoprazole (PROTONIX) EC tablet 80 mg  80 mg Oral Daily Cleotis Nipper, MD   80 mg at 12/08/12 0843  . traZODone (DESYREL) tablet 100 mg  100 mg Oral QHS PRN Cleotis Nipper, MD   100 mg at 12/07/12 2303    Lab Results: No results found for this or any previous visit (from the past 48 hour(s)).  Physical Findings: AIMS: Facial and Oral Movements Muscles of Facial Expression: None, normal Lips and Perioral Area: None, normal Jaw: None, normal Tongue: None, normal,Extremity Movements Upper (arms, wrists, hands, fingers): None, normal Lower (legs, knees, ankles, toes): None, normal, Trunk Movements Neck, shoulders, hips: None, normal, Overall Severity Severity of abnormal movements (highest score from questions above): None, normal Incapacitation due to abnormal movements: None, normal Patient's awareness of abnormal movements (rate only patient's report): No Awareness, Dental Status Current problems with teeth and/or dentures?: No Does patient usually wear dentures?: No  CIWA:    COWS:     Treatment Plan Summary: Daily contact with patient to assess and evaluate symptoms and progress in treatment Medication management  Plan: Continue crisis management and stabilization.  Medication management: Reviewed and no changes  indicated. Lamictal currently decreased to 100 mg daily and Wellbutrin SR 100 mg daily.  Encouraged patient to attend groups and participate in group counseling sessions and activities.  Discharge plan in progress.  Reviewed coping skills to use during anxious episodes such as deep breathing with guided imagery.  Continue current treatment plan.   Medical Decision Making Problem Points:  Established problem, stable/improving (1) and Review of psycho-social stressors (1) Data Points:  Review of medication regiment & side effects (2)  I certify that inpatient services furnished can reasonably be expected to improve the patient's condition.   Fransisca Kaufmann ANN NP-C 12/08/2012, 2:47 PM

## 2012-12-08 NOTE — Progress Notes (Signed)
Psychoeducational Group Note  Date:  12/08/2012 Time:  0915  Group Topic/Focus:  Identifying Needs:   The focus of this group is to help patients identify their personal needs that have been historically problematic and identify healthy behaviors to address their needs.  Participation Level:  active Participation Quality: good Affect: flat Cognitive:  good  Insight:  good  Engagement in Group: engaged  Additional Comments:   PD RN BC 

## 2012-12-08 NOTE — Clinical Social Work Note (Signed)
[facility] Group Notes:  (Clinical Social Work)  12/08/2012   3:00-4:00PM  Summary of Progress/Problems:   The main focus of today's process group was for the patient to identify something in their life that led to their hospitalization that they would like to change, then to discuss their motivation to change.   The patient expressed that what he wanted to change was to get off medications he has been taking for 8 years due to an inaccurate diagnosis of bipolar disorder.  He states he has been in a fog for 8 years.  Is starting to feel better.  Showed much support toward other patients.  Type of Therapy:  Process Group  Participation Level:  Active  Participation Quality:  Appropriate, Attentive, Sharing and Supportive  Affect:  Appropriate  Cognitive:  Alert, Appropriate and Oriented  Insight:  Engaged  Engagement in Therapy:  Engaged  Modes of Intervention:  Clarification, Support and Processing, Exploration, Discussion   Ambrose Mantle, LCSW 12/08/2012, 4:37 PM

## 2012-12-08 NOTE — Progress Notes (Signed)
[facility] Group Notes:  (Nursing/MHT/Case Management/Adjunct)  Date:  12/08/2012  Time:  2000  Type of Therapy:  Psychoeducational Skills  Participation Level:  Active  Participation Quality:  Attentive  Affect:  Appropriate  Cognitive:  Appropriate  Insight:  Improving  Engagement in Group:  Engaged  Modes of Intervention:  Education  Summary of Progress/Problems: The patient expressed in group this evening that he had a good day. He stated that he felt as if he were experiencing symptoms of withdrawal.  Despite that, he has remained positive and has felt "empowered". His goal for tomorrow is to work on his discharge plans.   Hazle Coca S 12/08/2012, 9:55 PM

## 2012-12-08 NOTE — Progress Notes (Signed)
D: Appears flat on approach and is anxious in conversation. Cooperative with assessment. No acute distress noted. Feels like he is doing well but did c/o poor sleep. Hopeful for D/C Monday and feels like he will be ready. Feels like his mood is stable. Denies pain. Has no questions or concerns otherwise. Denies SI/HI/AVH and contracts for safety.   A: Safety maintained with q15 minute observation. Support and encouragement provided. Meds given as ordered by MD. Encouraged group participation. Encouraged to discuss poor sleep with practitioner.  R: Mood remains anxious. Visible on milieu and interacting well with peers. He is compliant with unit programming and POC. Remains safe on q15 min observation. Will continue current POC and q15 minute observation.

## 2012-12-09 NOTE — Progress Notes (Signed)
Psychoeducational Group Note  Date:  12/09/2012 Time: 1015 Group Topic/Focus:  Making Healthy Choices:   The focus of this group is to help patients identify negative/unhealthy choices they were using prior to admission and identify positive/healthier coping strategies to replace them upon discharge.  Participation Level:  Active  Participation Quality:  Appropriate  Affect:  Appropriate  Cognitive:  Appropriate  Insight:  Resistant  Engagement in Group:  Engaged  Additional Comments:    Rich Brave 7:02 PM. 12/09/2012

## 2012-12-09 NOTE — Progress Notes (Signed)
Patient ID: Sean Hill, male   DOB: 07/16/1941, 72 y.o.   MRN: [id number] Two patient on the unit reported to another nurse that pt has been complaining of chest pains and refused to tell the nursing staff because of fear of staying an extra day. Writer talked to pt of the complaint. Pt denied it and stated it was just indigestion. Pt stated he is feeling "great".  B/P was 134/76 PR 71. Pt is alert and oriented in his room. Pt pt denies any chest pain. Pt denies indigestion medication. Pt  daughter is visiting

## 2012-12-09 NOTE — Progress Notes (Signed)
[facility] Group Notes:  (Nursing/MHT/Case Management/Adjunct)  Date:  12/09/2012  Time:  2000  Type of Therapy:  Psychoeducational Skills  Participation Level:  Active  Participation Quality:  Attentive  Affect:  Appropriate  Cognitive:  Appropriate  Insight:  Improving  Engagement in Group:  Improving  Modes of Intervention:  Education  Summary of Progress/Problems: The patient stated that he had a "great day". He states that he slept well last evening. His goal is to get discharged, ie. "I hope to see this place in my rear view mirror".   Hazle Coca S 12/09/2012, 9:16 PM

## 2012-12-09 NOTE — Progress Notes (Signed)
D   Pt reports feeling better this morning  Not having the symptoms of withdrawal that he was having yesterday  He expressed the desire to come off of all the medications because he said he is not bipolar as he was dx and said for the past several years he has not been himself while taking the medications and is just now able to hold a logical conversation with his daughter   He took all the prescribed medications except for wellbutrin which he refused pending speaking with the doctor  His goal is to get off of all the medications during this hospitalization A   Verbal support given  Medications administered and effectiveness monitored   Q 15 min checks R   Pt safe at present

## 2012-12-09 NOTE — Progress Notes (Signed)
Patient ID: Sean Hill, male   DOB: 30-Aug-1941, 72 y.o.   MRN: [id number] D: Patient mood/affect is depressed/anxious. Pt stated he is feeling great and looking forward to discharge tomorrow. Pt denies SI/HI/AVH and pain. Pt is active on the unit and attending groups. Pt is appropriate with staff and peers. Cooperative with assessment. No acute distressed noted at this time.   A: Met with pt 1:1. Medications administered as prescribed. Writer encouraged pt to discuss feelings. Pt encouraged to come to staff with any question or concerns. 15 minutes checks for safety.  R: Patient remains safe. He is complaint with  group programming. Continue current POC.

## 2012-12-09 NOTE — Progress Notes (Signed)
Patient has been in the dayroom watching tv and interacting appropriately with peers. Patient attended group this evening and participated. He reports that he is looking forward to being discharged on tomorrow.Support and encouragement offered.  He denies si/hi/a/v hallucinations. Safety maintained with 15 min checks, will continue to monitor.

## 2012-12-09 NOTE — Clinical Social Work Note (Signed)
[facility] Group Notes:  (Clinical Social Work)  12/09/2012   3:00-4:00PM  Summary of Progress/Problems:    The main focus of today's process group was to define "support" and describe what healthy supports are.  We then discussed how and why to increase patient supports, using motivational interviewing.  An emphasis was placed on using counselor, doctor, therapy groups, self-help groups and problem-specific support groups to expand supports.   The patient expressed comfort with giving supports, less so with receiving it.  Talked about being raised by alcoholic parents who did not help him or provide him supportive background, how long it took for him to realize that they had so many problems, they just could not.  He did not actually figure this out until they were dead.    Type of Therapy:  Process Group  Participation Level:  Active  Participation Quality:  Appropriate, Attentive, Sharing and Supportive  Affect:  Appropriate  Cognitive:  Alert, Appropriate and Oriented  Insight:  Engaged  Engagement in Therapy:  Engaged  Modes of Intervention:  Education,  Support and Processing, Exploration, Discussion   Ambrose Mantle, LCSW 12/09/2012, 3:51 PM

## 2012-12-09 NOTE — Progress Notes (Signed)
[facility] MD Progress Note  12/09/2012 2:03 PM Sean Hill  MRN:  [id number] Subjective:  Sean Hill reports that he continues to do well on his taper. He denies feeling depressed but is mildly anxious about the fact that his taper is still in progress. Patient stated to writer "Why have we slowed down on this?" The patient was educated about the need to progress in a safe manner. The patient sees no reason to be on medications and remains focused on reporting that one bad time years ago led him to be on medication.  Diagnosis:   Axis I: Major Depression, Recurrent severe Axis II: Deferred Axis III:  Past Medical History  Diagnosis Date  . Bipolar disorder     Has psychiatrist.  BH admission (suicidal) 07/20/11.  Marland Kitchen Hyperlipemia     Intol of meds: GI side effects  . 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  No hx of MRSA infection.  . 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 Nephrolithiasis   . GERD (gastroesophageal reflux disease)   . Inguinal hernia 02/25/11    Left sided hernia with fat  . Hip pain, left 2011-2012    Left hip and groin: MRI pelvis and left hip 02/18/11 showed NORMAL hip, with small inguinal hernia containing fat and sigmoid diverticulosis.  Hip pain presumably referred pain from hernia/diverticular dz??.  . Cervical spondylosis     Primarily C4-5 and C5-6--referred to [facility] 02/2011.  . Depression   . Bipolar disorder, current episode depressed, moderate   . Sleep apnea   . Asthma    Axis IV: other psychosocial or environmental problems Axis V: 51-60 moderate symptoms  ADL's:  Intact  Sleep: Good  Appetite:  Good  Suicidal Ideation:  Denies Homicidal Ideation:  Denies AEB (as evidenced by):  Psychiatric Specialty Exam: Review of Systems  Constitutional: Negative.   HENT: Negative.   Eyes: Negative.   Respiratory: Negative.   Cardiovascular: Negative.   Gastrointestinal:  Negative.   Genitourinary: Negative.   Musculoskeletal: Negative.   Skin: Negative.   Neurological: Negative.   Endo/Heme/Allergies: Negative.   Psychiatric/Behavioral: Negative for depression, suicidal ideas, hallucinations and memory loss. The patient is nervous/anxious and has insomnia.     Blood pressure 134/80, pulse 89, temperature 98.4 F (36.9 C), temperature source Oral, resp. rate 18, height 5\' 8"  (1.727 m), weight 116.121 kg (256 lb).Body mass index is 38.93 kg/(m^2).  General Appearance: Casual  Eye Contact::  Good  Speech:  Clear and Coherent  Volume:  Normal  Mood:  Anxious  Affect:  Appropriate  Thought Process:  Goal Directed and Intact  Orientation:  Full (Time, Place, and Person)  Thought Content:  WDL  Suicidal Thoughts:  No  Homicidal Thoughts:  No  Memory:  Immediate;   Good Recent;   Good Remote;   Good  Judgement:  Fair  Insight:  Fair  Psychomotor Activity:  Normal  Concentration:  Good  Recall:  Good  Akathisia:  No  Handed:  Right  AIMS (if indicated):     Assets:  Communication Skills Desire for Improvement Financial Resources/Insurance Intimacy Leisure Time Physical Health Social Support  Sleep:  Number of Hours: 5   Current Medications: Current Facility-Administered Medications  Medication Dose Route Frequency Provider Last Rate Last Dose  . acetaminophen (TYLENOL) tablet 650 mg  650 mg Oral Q6H PRN Cleotis Nipper, MD   650 mg at 12/07/12  1607  . alum & mag hydroxide-simeth (MAALOX/MYLANTA) 200-200-20 MG/5ML suspension 30 mL  30 mL Oral Q4H PRN Cleotis Nipper, MD      . buPROPion [facility] SR) 12 hr tablet 100 mg  100 mg Oral Daily Himabindu Ravi, MD   100 mg at 12/08/12 0843  . clonazePAM (KLONOPIN) tablet 0.5 mg  0.5 mg Oral QHS PRN Cleotis Nipper, MD   0.5 mg at 12/08/12 2218  . gabapentin (NEURONTIN) capsule 300 mg  300 mg Oral TID Himabindu Ravi, MD   300 mg at 12/09/12 1255  . lamoTRIgine (LAMICTAL) tablet 100 mg  100 mg Oral Daily  Himabindu Ravi, MD   100 mg at 12/09/12 1610  . magnesium hydroxide (MILK OF MAGNESIA) suspension 30 mL  30 mL Oral Daily PRN Cleotis Nipper, MD      . multivitamin with minerals tablet 1 tablet  1 tablet Oral Daily Cleotis Nipper, MD   1 tablet at 12/09/12 [phone]  . pantoprazole (PROTONIX) EC tablet 80 mg  80 mg Oral Daily Cleotis Nipper, MD   80 mg at 12/09/12 0820  . traZODone (DESYREL) tablet 100 mg  100 mg Oral QHS PRN Cleotis Nipper, MD   100 mg at 12/08/12 2218    Lab Results: No results found for this or any previous visit (from the past 48 hour(s)).  Physical Findings: AIMS: Facial and Oral Movements Muscles of Facial Expression: None, normal Lips and Perioral Area: None, normal Jaw: None, normal Tongue: None, normal,Extremity Movements Upper (arms, wrists, hands, fingers): None, normal Lower (legs, knees, ankles, toes): None, normal, Trunk Movements Neck, shoulders, hips: None, normal, Overall Severity Severity of abnormal movements (highest score from questions above): None, normal Incapacitation due to abnormal movements: None, normal Patient's awareness of abnormal movements (rate only patient's report): No Awareness, Dental Status Current problems with teeth and/or dentures?: No Does patient usually wear dentures?: No  CIWA:    COWS:     Treatment Plan Summary: Daily contact with patient to assess and evaluate symptoms and progress in treatment Medication management  Plan: Continue crisis management and stabilization.  Medication management: Reviewed regimen and no changes indicated. Continue taper of Wellbutrin and Lamictal.  Encouraged patient to attend groups and participate in group counseling sessions and activities.  Discharge plan in progress.  Continue current treatment plan.   Medical Decision Making Problem Points:  Established problem, stable/improving (1) and Review of psycho-social stressors (1) Data Points:  Review of medication regiment & side effects  (2)  I certify that inpatient services furnished can reasonably be expected to improve the patient's condition.   Fransisca Kaufmann ANN NP-C 12/09/2012, 2:03 PM

## 2012-12-10 DIAGNOSIS — F489 Nonpsychotic mental disorder, unspecified: Secondary | ICD-10-CM

## 2012-12-10 DIAGNOSIS — F332 Major depressive disorder, recurrent severe without psychotic features: Principal | ICD-10-CM

## 2012-12-10 MED ORDER — BUPROPION HCL ER (SR) 100 MG PO TB12: 100.0000 mg | ORAL_TABLET | Freq: Every day | ORAL | Status: DC

## 2012-12-10 MED ORDER — LAMOTRIGINE 100 MG PO TABS: 100.0000 mg | ORAL_TABLET | Freq: Every day | ORAL | Status: DC

## 2012-12-10 MED ORDER — GABAPENTIN 300 MG PO CAPS: 300.0000 mg | ORAL_CAPSULE | Freq: Three times a day (TID) | ORAL | Status: DC

## 2012-12-10 MED ORDER — TRAZODONE HCL 100 MG PO TABS: 100.0000 mg | ORAL_TABLET | Freq: Every evening | ORAL | Status: DC | PRN

## 2012-12-10 NOTE — Tx Team (Signed)
Interdisciplinary Treatment Plan Update   Date Reviewed:  12/10/2012  Time Reviewed:  9:46 AM  Progress in Treatment:   Attending groups: Yes Participating in groups: Yes, patient is attending groups Taking medication as prescribed: Yes  Tolerating medication: Yes Family/Significant other contact made:  Contact made with wife. Patient understands diagnosis: Yes  Discussing patient identified problems/goals with staff: Yes Medical problems stabilized or resolved: Yes Denies suicidal/homicidal ideation: Yes Patient has not harmed self or others: Yes  For review of initial/current patient goals, please see plan of care.  Estimated Length of Stay:  Discharge home today  Reasons for Continued Hospitalization:   New Problems/Goals identified:    Discharge Plan or Barriers:   Home with outpatient follow up [facility] and Therapist    Additional Comments:  Patient reports being much better and hopes to discharge home today.  He denies SI/HI and rates all symptoms at zero.  Attendees:  Patient:   Sean Hill 12/10/2012 9:46 AM   Signature: Patrick North, MD 12/10/2012 9:46 AM  Signature:Carol Earlene Plater, RN 12/10/2012 9:46 AM  Signature: Quintella Reichert, RN 12/10/2012 9:46 AM  Signature 12/10/2012 9:46 AM  Signature:   12/10/2012 9:46 AM  Signature:  Juline Patch, LCSW 12/10/2012 9:46 AM  Signature:  12/10/2012 9:46 AM  Signature: Liliane Bade, BSW 12/10/2012 9:46 AM  Signature: 12/10/2012 9:46 AM  Signature:    Signature:    Signature:      Scribe for Treatment Team:   Juline Patch,  12/10/2012 9:46 AM \

## 2012-12-10 NOTE — Progress Notes (Signed)
[facility] Adult Case Management Discharge Plan :  Will you be returning to the same living situation after discharge: Yes,  Patient is returning home with wife. At discharge, do you have transportation home?:Yes,  Wife will transport patient home. Do you have the ability to pay for your medications:Yes,  Patient can afford medications  Release of information consent forms completed and in the chart;  Patient's signature needed at discharge.  Patient to Follow up at: Follow-up Information   Follow up with Maxcine Ham - [facility] On 12/11/2012. (You are scheduled to see [name] on Tuesday, December 11, 2012 at 3:00 PM)    Contact information:   [address] [place], [place]   [address]  [phone]      Follow up with [name] [facility] On 12/13/2012. (You are scheduled to see [name] on Thursday, December 13, 2012 at 3 PM)    Contact information:   [address] [place], [place]    [address]  [phone]      Patient denies SI/HI:   Yes,  Patient is not endorsing SI/HI or thoughts of self harm.    Safety Planning and Suicide Prevention discussed:  Yes,  Reviewed during aftercare groups,  Brooke Steinhilber, [place] July 12/10/2012, 12:18 PM

## 2012-12-10 NOTE — Progress Notes (Signed)
[facility] LCSW Aftercare Discharge Planning Group Note  12/10/2012 9:57 AM  Participation Quality:  Appropriate  Affect:  Appropriate  Cognitive:  Appropriate  Insight:  Engaged  Engagement in Group:  Engaged  Modes of Intervention:  Education, Exploration, Dentist, Rapport Building and Support  Summary of Progress/Problems:  Patient reports being fantastic today.  He denies SI/HI and rates all symptoms at zero.  He provided consent to speak with wife and also consents for follow up with [facility].  Patient is looking forward to discharging home today. Wynn Banker 12/10/2012, 9:57 AM

## 2012-12-10 NOTE — Discharge Summary (Signed)
Physician Discharge Summary Note  Patient:  Sean Hill is an 72 y.o., male MRN:  [id number] DOB:  1941/04/15 Patient phone:  [phone] (home)  Patient address:   [address] [place] [address]   Date of Admission:  12/04/2012 Date of Discharge: 12/10/2012  Discharge Diagnoses: Principal Problem:   Bipolar disorder  Axis Diagnosis:  AXIS I: Anxiety, post-partum and Major Depression, Recurrent severe  AXIS II: Deferred  AXIS III:  Past Medical History   Diagnosis  Date   .  Bipolar disorder      Has psychiatrist. BH admission (suicidal) 07/20/11.   Marland Kitchen  Hyperlipemia      Intol of meds: GI side effects   .  MRSA carrier      Intranasal bactroban tx 2011. No hx of MRSA infection.   .  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  Nephrolithiasis    .  GERD (gastroesophageal reflux disease)    .  Inguinal hernia  02/25/11     Left sided hernia with fat   .  Hip pain, left  2011-2012     Left hip and groin: MRI pelvis and left hip 02/18/11 showed NORMAL hip, with small inguinal hernia containing fat and sigmoid diverticulosis. Hip pain presumably referred pain from hernia/diverticular dz??.   .  Cervical spondylosis      Primarily C4-5 and C5-6--referred to [facility] 02/2011.   .  Depression    .  Bipolar disorder, current episode depressed, moderate    .  Sleep apnea    .  Asthma     AXIS IV: other psychosocial or environmental problems  AXIS V: 61-70 mild symptoms   Reason for admission: Patient is a 72 yo WM who presented with increasing depression and with suicidal thoughts  Of going to a local parking deck and jumping off.    Level of Care:  Inpatient Hospitalization.  Hospital Course:   The patient attended treatment team meeting this am and met with treatment team members. The patient's symptoms, treatment plan and response to treatment was discussed. The patient reports that his symptoms  have resolved.  During his hospital stay, Medications were tapered gradually, Lamictal and bupropion were tapered from 400mg  daily to 100mg  daily by discharge. Patient reported he was able to think more clearly and not forgetting words like he was doing. His neurontin was tapered to 300mg  po tid which patient tolerated well. Patient attended groups and benefited from improving his coping skills.  Patient received furthur medication management. They were ordered and received as outlined below:    Medication List    STOP taking these medications       clonazePAM 1 MG tablet  Commonly known as:  KLONOPIN     FLAX SEED OIL PO     multivitamin with minerals Tabs     venlafaxine XR 150 MG 24 hr capsule  Commonly known as:  EFFEXOR-XR      TAKE these medications     Indication   buPROPion 100 MG 12 hr tablet  Commonly known as:  WELLBUTRIN SR  Take 1 tablet (100 mg total) by mouth daily.   Indication:  Major Depressive Disorder     gabapentin 300 MG capsule  Commonly known as:  NEURONTIN  Take 1 capsule (300 mg total) by mouth 3 (three) times daily.      lamoTRIgine 100 MG tablet  Commonly known as:  LAMICTAL  Take 1 tablet (100 mg total) by mouth daily.   Indication:  Depression     omeprazole 40 MG capsule  Commonly known as:  PRILOSEC  Take 40 mg by mouth 2 (two) times daily.      traZODone 100 MG tablet  Commonly known as:  DESYREL  Take 1 tablet (100 mg total) by mouth at bedtime as needed for sleep.   Indication:  Major Depressive Disorder       They were also enrolled in group counseling sessions and activities in which they participated actively.       Follow-up Information   Follow up with Maxcine Ham - [facility] On 12/11/2012. (You are scheduled to see [name] on Tuesday, December 11, 2012 at 3:00 PM)    Contact information:   [address] [place], [place]   [address]  [phone]      Follow up with [name] [facility]  On 12/13/2012. (You are scheduled to see [name] on Thursday, December 13, 2012 at 3 PM)    Contact information:   [address] [place], [place]    [address]  [phone]      Upon discharge, patient denies suicidal, homicidal ideations, auditory, visual hallucinations and or delusional thinking. Patient left [facility] with all personal belongings via personal transportation in no apparent distress.  Consults:  Please see electronic medical record for details.  Significant Diagnostic Studies:  Please see electronic medical record for details.  Discharge Vitals:   Blood pressure 136/80, pulse 94, temperature 97.5 F (36.4 C), temperature source Oral, resp. rate 16, height 5\' 8"  (1.727 m), weight 116.121 kg (256 lb)..  Mental Status Exam: See Mental Status Examination and Suicide Risk Assessment completed by Attending Physician prior to discharge.  Discharge destination:  Home  Is patient on multiple antipsychotic therapies at discharge:  No  Has Patient had three or more failed trials of antipsychotic monotherapy by history: N/A Recommended Plan for Multiple Antipsychotic Therapies: N/A Discharge Orders   Future Appointments Provider Department Dept Phone   12/11/2012 3:00 PM Carman Ching, [place] [facility] [place] [phone]   12/19/2012 3:00 PM Carman Ching, LCSW BEHAVIORAL HEALTH OUTPATIENT THERAPY [place] [phone]   01/28/2013 3:00 PM Carman Ching, LCSW BEHAVIORAL HEALTH OUTPATIENT THERAPY [place] [phone]   02/20/2013 3:00 PM Carman Ching, LCSW BEHAVIORAL HEALTH OUTPATIENT THERAPY [place] [phone]   03/06/2013 3:00 PM Carman Ching, LCSW BEHAVIORAL HEALTH OUTPATIENT THERAPY [place] [phone]   03/13/2013 3:00 PM Carman Ching, LCSW BEHAVIORAL HEALTH OUTPATIENT THERAPY [place] [phone]   03/20/2013 3:00 PM Carman Ching, LCSW BEHAVIORAL HEALTH OUTPATIENT THERAPY  [phone]    Future Orders Complete By Expires     Diet - low sodium heart healthy  As directed     Increase activity slowly  As directed         Medication List    STOP taking these medications       clonazePAM 1 MG tablet  Commonly known as:  KLONOPIN     FLAX SEED OIL PO     multivitamin with minerals Tabs     venlafaxine XR 150 MG 24 hr capsule  Commonly known as:  EFFEXOR-XR      TAKE these medications     Indication   buPROPion 100 MG 12 hr tablet  Commonly known as:  WELLBUTRIN SR  Take 1 tablet (100 mg total) by mouth daily.   Indication:  Major Depressive Disorder  gabapentin 300 MG capsule  Commonly known as:  NEURONTIN  Take 1 capsule (300 mg total) by mouth 3 (three) times daily.      lamoTRIgine 100 MG tablet  Commonly known as:  LAMICTAL  Take 1 tablet (100 mg total) by mouth daily.   Indication:  Depression     omeprazole 40 MG capsule  Commonly known as:  PRILOSEC  Take 40 mg by mouth 2 (two) times daily.      traZODone 100 MG tablet  Commonly known as:  DESYREL  Take 1 tablet (100 mg total) by mouth at bedtime as needed for sleep.   Indication:  Major Depressive Disorder           Follow-up Information   Follow up with Maxcine Ham - [facility] On 12/11/2012. (You are scheduled to see [name] on Tuesday, December 11, 2012 at 3:00 PM)    Contact information:   [address] [place], [place]   [address]  [phone]      Follow up with [name] [facility] On 12/13/2012. (You are scheduled to see [name] on Thursday, December 13, 2012 at 3 PM)    Contact information:   [address] [place], [place]    [address]  [phone]     Follow-up recommendations:   Activities: Resume typical activities Diet: Resume typical diet Other: Follow up with outpatient provider and report any side effects to out patient prescriber.  Comments:  Take all your medications as prescribed by your mental healthcare  provider. Report any adverse effects and or reactions from your medicines to your outpatient provider promptly. Patient is instructed and cautioned to not engage in alcohol and or illegal drug use while on prescription medicines. In the event of worsening symptoms, patient is instructed to call the crisis hotline, 911 and or go to the nearest ED for appropriate evaluation and treatment of symptoms. Follow-up with your primary care provider for your other medical issues, concerns and or health care needs.  SignedDaleen Bo, [place]cla Forgione 12/10/2012 1:30 PM

## 2012-12-10 NOTE — Progress Notes (Signed)
Adult Psychoeducational Group Note  Date:  12/10/2012 Time:  11:51 AM  Group Topic/Focus:  Wellness Toolbox:   The focus of this group is to discuss various aspects of wellness, balancing those aspects and exploring ways to increase the ability to experience wellness.  Patients will create a wellness toolbox for use upon discharge.  Participation Level:  Active  Participation Quality:  Appropriate, Attentive and Sharing  Affect:  Appropriate  Cognitive:  Alert and Appropriate  Insight: Appropriate  Engagement in Group:  Engaged  Modes of Intervention:  Discussion  Additional Comments:  Pt was appropriate and attentive while attending group. Pt stated that meditation works for him and he wants to try scuba diving to help maintain his wellness in the future.   Sharyn Lull 12/10/2012, 11:51 AM

## 2012-12-10 NOTE — [facility] Suicide Risk Assessment (Signed)
Suicide Risk Assessment  Discharge Assessment     Demographic Factors:  Male and Caucasian  Mental Status Per Nursing Assessment::   On Admission:  Suicidal ideation indicated by patient  Current Mental Status by Physician: Patient alert and oriented to 4. Denies aH/VH/SI/HI.  Loss Factors: NA  Historical Factors: Impulsivity  Risk Reduction Factors:   Sense of responsibility to family, Positive social support and Positive coping skills or problem solving skills  Continued Clinical Symptoms:  Depression:   Recent sense of peace/wellbeing  Cognitive Features That Contribute To Risk:  Cognitively intact    Suicide Risk:  Minimal: No identifiable suicidal ideation.  Patients presenting with no risk factors but with morbid ruminations; may be classified as minimal risk based on the severity of the depressive symptoms  Discharge Diagnoses:   AXIS I:  Anxiety, post-partum and Major Depression, Recurrent severe AXIS II:  Deferred AXIS III:   Past Medical History  Diagnosis Date  . Bipolar disorder     Has psychiatrist.  BH admission (suicidal) 07/20/11.  Marland Kitchen Hyperlipemia     Intol of meds: GI side effects  . 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  No hx of MRSA infection.  . 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 Nephrolithiasis   . GERD (gastroesophageal reflux disease)   . Inguinal hernia 02/25/11    Left sided hernia with fat  . Hip pain, left 2011-2012    Left hip and groin: MRI pelvis and left hip 02/18/11 showed NORMAL hip, with small inguinal hernia containing fat and sigmoid diverticulosis.  Hip pain presumably referred pain from hernia/diverticular dz??.  . Cervical spondylosis     Primarily C4-5 and C5-6--referred to [facility] 02/2011.  . Depression   . Bipolar disorder, current episode depressed, moderate   . Sleep apnea   . Asthma    AXIS IV:  other psychosocial or environmental  problems AXIS V:  61-70 mild symptoms  Plan Of Care/Follow-up recommendations:  Activity:  as tolerated Diet:  low salt and low fat diet Follow up with outpatient appointments.  Is patient on multiple antipsychotic therapies at discharge:  No   Has Patient had three or more failed trials of antipsychotic monotherapy by history:  No  Recommended Plan for Multiple Antipsychotic Therapies: NA  Sean Hill 12/10/2012, 9:51 AM

## 2012-12-10 NOTE — Progress Notes (Signed)
Patient ID: Sean Hill, male   DOB: 12-05-1940, 72 y.o.   MRN: [id number] Pt was discharged ambulatory to ride with and live with his wife.  He denies SI/HI.  He verbalizes understanding of his discharge meds and followup. He was given scripts by MD.  He plans to follow uo with  [name] in [facility] outpatient and go to [facility] for med management.  Patient says he feels better with each medication reduction.  He knows to work with Tresa Endo V to further reduce meds and not do it on his own.  He is appreciative of care received and states he feels he was listened to.

## 2012-12-10 NOTE — Progress Notes (Signed)
[facility] INPATIENT:  Family/Significant Other Suicide Prevention Education  Suicide Prevention Education:  Education Completed; Sean Hill, Wife, [phone] has been identified by the patient as the family member/significant other with whom the patient will be residing, and identified as the person(s) who will aid the patient in the event of a mental health crisis (suicidal ideations/suicide attempt).  With written consent from the patient, the family member/significant other has been provided the following suicide prevention education, prior to the and/or following the discharge of the patient.  The suicide prevention education provided includes the following:  Suicide risk factors  Suicide prevention and interventions  National Suicide Hotline telephone number  [facility] assessment telephone number  [facility] Emergency Assistance 911  [facility] and/or Residential Mobile Crisis Unit telephone number  Request made of family/significant other to:  Remove weapons (e.g., guns, rifles, knives), all items previously/currently identified as safety concern.  Wife advised there are no guns in the home.  Remove drugs/medications (over-the-counter, prescriptions, illicit drugs), all items previously/currently identified as a safety concern.  The family member/significant other verbalizes understanding of the suicide prevention education information provided.  The family member/significant other agrees to remove the items of safety concern listed above.  Wynn Banker 12/10/2012, 11:38 AM

## 2012-12-11 ENCOUNTER — Ambulatory Visit (INDEPENDENT_AMBULATORY_CARE_PROVIDER_SITE_OTHER): Payer: Medicare Other | Admitting: Psychiatry

## 2012-12-11 DIAGNOSIS — F319 Bipolar disorder, unspecified: Secondary | ICD-10-CM

## 2012-12-11 MED ORDER — GABAPENTIN 300 MG PO CAPS: 300.0000 mg | ORAL_CAPSULE | Freq: Three times a day (TID) | ORAL | Status: DC

## 2012-12-11 MED ORDER — OMEPRAZOLE 40 MG PO CPDR: 40.0000 mg | DELAYED_RELEASE_CAPSULE | Freq: Two times a day (BID) | ORAL | Status: DC

## 2012-12-11 NOTE — Progress Notes (Deleted)
[facility] Biopsychosocial Assessment  Sean Hill 72 y.o. 12/11/2012   Referred by: ***   PRESENTING PROBLEM Chief Complaint: *** What are the main stressors in your life right now?  {CHL AMB BH LIFE STRESSORS:22831}   Describe a brief history of your present symptoms: ***  How long have you had these symptoms?: *** What effect have they had on your life?: ***   FAMILY ASSESSMENT Was the significant other/family member interviewed? {BHH YES OR NO:22294} If No, why?: *** Is significant other/family member supportive? {BHH YES OR NO:22294} Did significant other/family member express concerns for the patient? {BHH YES OR NO:22294} If Yes, describe: ***  Is significant other/family member willing to be part of treatment plan? {BHH YES OR NO:22294} Describe significant other/family member's perception of patient's illness: ***  Describe significant other/family member's perception of expectations with treatment: ***   MENTAL HEALTH HISTORY Have you ever been treated for a mental health problem? {BHH YES OR NO:22294}  If Yes, when? *** , where? ***, by whom? ***  Are you currently seeing a therapist or counselor? {BHH YES OR NO:22294} If Yes, whom? *** Have you ever had a mental health hospitalization? {BHH YES OR NO:22294} If Yes, when? *** , where? ***, why? ***, how many times? *** Have you ever had suicidal thoughts or attempted suicide? {BHH YES OR NO:22294} If Yes, when? ***  Describe ***  Have you ever been treated with medication for a mental health problem? {BHH YES OR NO:22294} If Yes, please list as completely as possible (name of medication, reason prescribed, and response: ***   FAMILY MENTAL HEALTH HISTORY Is there any history of mental health problems or substance abuse in your family? {BHH YES OR NO:22294} If Yes, please explain (include information on parents, siblings, aunts/uncles, grandparents, cousins, etc.): *** Has anyone in  your family been hospitalized for mental health problems? {BHH YES OR NO:22294} If Yes, please explain (including who, where, and for what length of time): ***   MARITAL STATUS Are you presently: {Marital Status:22678} How many times have you been married? *** Dates of previous marriages: *** Do you have any concerns regarding marriage? {BHH YES OR NO:22294} If Yes, please explain: ***  Do you have any children? {BHH YES OR NO:22294} If Yes, how many? *** Please list their sexes and ages: ***   LEISURE/RECREATION Describe how patient spends leisure time: ***   SOCIAL AND FAMILY HISTORY Who lives in your current household? *** Where were you born? *** Where did you grow up? *** Describe the household where you grew up: ***  Do you have siblings, step/half siblings? {BHH YES OR NO:22294} If Yes, please list names, sex and ages: *** Are your parents still living? {BHH YES OR NO:22294} If No, what was the cause of death? *** If Yes, father's age: ***   His health: *** If Yes, mother's age: *** Her health: *** Where do your parents live? *** Do you see them often? {BHH YES OR NO:22294} If No, why not? ***  Are your parents separated/divorced? {BHH YES OR NO:22294} If Yes, approximately when? *** Have you ever been exposed to any form of abuse? {BHH YES OR NO:22294} If Yes: {Type of abuse:20566} Did the abuse happen recently, or in the past? *** Were you the victim or offender, please explain: ***  Are you having problems with any member or your family? {BHH YES OR NO:22294} If Yes, please explain: ***  What Religion are you? *** Do you  have any cultural or religious beliefs which could impact your treatment? {BHH YES OR NO:22294} If Yes, please explain (including customs, celebrations, attitude towards alcohol and drugs, authority in family, etc):  ***  Have you ever been in the [employer]? {BHH YES OR NO:22294} If Yes, when? *** for how long? ***  Were you ever in active  combat? {BHH YES OR NO:22294} If Yes, when? *** for how long? *** Were there any lasting effects on you? {BHH YES OR NO:22294} If Yes, please explain: ***  Why did you leave the military (include type of discharge, disciplinary action, substance abuse, or any Post Traumatic Stress Symptoms): ***  Do you have any legal problems/involvements? {BHH YES OR NO:22294} If Yes, please explain: ***   EDUCATIONAL BACKGROUND How many grades have you completed? {misc; education:31912} Do you hold any Degrees? {BHH YES OR NO:22294} If Yes, in what? ***  From where? *** What were your special talents/interests in school? ***  Did you have any problems in school? {BHH YES OR NO:22294} If Yes, were these problems behavioral, attentional, or due to learning difficulties? *** Were any medications ever prescribed for these problems? {BHH YES OR NO:22294} If Yes, what were the medications, including the dosage, how long you took these and who prescribed them? ***   WORK HISTORY Do you work? {BHH YES OR NO:22294} If Yes, what is your occupation? *** How long have you been employed there? ***  Name of employer: *** Do you enjoy your present job? {BHH YES OR NO:22294} What is your previous work history? *** Are you having trouble on your present job or had difficulties holding a job? {BHH YES OR NO:22294} If Yes, please explain: ***  Does your spouse work? {BHH YES OR NO:22294} If Yes, where and for how long? *** Are you under financial stress? {BHH YES OR NO:22294} If Yes, please explain: ***  Financial Resources  Patient is: Self supportive (no assistance) {BHH YES OR NO:22294}    Requires referral for financial assistance {BHH YES OR ZO:10960}  Requires referral for credit counseling {BHH YES OR NO:22294}  Current situation affects financial situation {BHH YES OR AV:40981  Adolescent/child in need of financial support {BHH YES OR NO:22294} Is there anything else you would like to tell us?  Carman Ching, LCSW 12/11/2012

## 2012-12-11 NOTE — Progress Notes (Signed)
   THERAPIST PROGRESS NOTE  Session Time: 3:00-3:50 pm   Participation Level: Active  Behavioral Response: CasualAlertEuthymic  Type of Therapy: Individual Therapy  Treatment Goals addressed: Coping  Interventions: Solution Focused  Summary: Sean Hill is a 72 y.o. male who presents with euthymic mood and affect. This was patients first session following discharge from an inpatient hospitalization. Patient admitted himself to receive assistance getting off from several of his psychotropic medications. He states he is down to three mental health medications and feels "good". He states his thinking is clearer and his gross motor skills have improved significantly. Patient states he feels mild anxiety and agitated but is watching that to make sure it does not escalate into aggression.  He states "I feel like I have my life back". He was upset by how many medications he was taking and the negative side affects he was experiencing that he states he is no longer feeling.   Suicidal/Homicidal: Nowithout intent/plan  Therapist Response: Assessed overall level of functioning per patient self report and behaviors during the session. Explored recent hospitalization and discussed coping skills to continue to use to help manage anxiety and feelings of agitation.   Plan: Return again in 1 weeks.  Diagnosis: Axis I: Bipolar, mixed    Axis II: No diagnosis    Carman Ching, LCSW 12/11/2012

## 2012-12-13 NOTE — Progress Notes (Signed)
Patient Discharge Instructions:  After Visit Summary (AVS):   Faxed to:  12/13/12 Discharge Summary Note:   Faxed to:  12/13/12 Psychiatric Admission Assessment Note:   Faxed to:  12/13/12 Suicide Risk Assessment - Discharge Assessment:   Faxed to:  12/13/12 Faxed/Sent to the Next Level Care provider:  12/13/12 Next Level Care Provider Has Access to the EMR,  12/13/12 Faxed to [facility] @ [phone] Records provided to [facility] Outpatient clinic via CHL/Epic access  Jerelene Redden, 12/13/2012, 4:21 PM

## 2012-12-19 ENCOUNTER — Ambulatory Visit (INDEPENDENT_AMBULATORY_CARE_PROVIDER_SITE_OTHER): Payer: Medicare Other | Admitting: Psychiatry

## 2012-12-19 DIAGNOSIS — F319 Bipolar disorder, unspecified: Secondary | ICD-10-CM

## 2012-12-19 NOTE — Progress Notes (Signed)
   THERAPIST PROGRESS NOTE  Session Time: 3:00-3:50 pm  Participation Level: Active  Behavioral Response: CasualAlertAnxious and Irritable  Type of Therapy: Individual Therapy  Treatment Goals addressed: Coping  Interventions: CBT and Supportive  Summary: Sean Hill is a 72 y.o. male who presents with moderate anxious and irritable mood. Patient states he continues to remain stable in his mood and functioning as his body adjusts to stopping a majority of his mental health medications following his recent hospitalization. He states he has five main side affects: heart palpitations, increased anxiety, increased agitation, decreased appitite and vertigo with nausea. He states he can manage these symptoms because he is enjoying the mental clarity and increased gross motor ability he has since stopping the medication. He is hoping to feel better though. He expressed regret over incidences this past week where he "blew up at his wife". He states his impulsivity to react out of irritation is returning and he is keeping awareness on that. He expressed an interest in learning skills to better manage his anxiety and irritation.   Suicidal/Homicidal: Nowithout intent/plan  Therapist Response: Assessed overall level of functioning per patient self report. Reviewed symptoms and side affects of stopping medications and options to implement coping skills to manage symptoms.   Plan: Return again in 1 weeks.  Diagnosis: Axis I: Bipolar Disorder    Axis II: No diagnosis    Sean Hill E, LCSW 12/19/2012

## 2012-12-26 ENCOUNTER — Telehealth: Payer: Self-pay | Admitting: Internal Medicine

## 2012-12-26 NOTE — Telephone Encounter (Signed)
Patient Information:  Caller Name: Keigan  Phone: [phone]  Patient: Sean Hill, Sean Hill  Gender: Male  DOB: March 14, 1941  Age: 72 Years  PCP: Thomos Lemons [facility])  Office Follow Up:  Does the office need to follow up with this patient?: No  Instructions For The Office: N/A   Symptoms  Reason For Call & Symptoms: Diarrhea started about one month ago, has been daily.  Will have liquid or diarrhea stool with 60-90 min after eating.  Has had 5 stools in the last 24 hours. Has dry mouth.  Took last dose of Effexor 3-4 months ago - stopped cold [place] due to drugged feeling.  Reviewed Health History In EMR: Yes  Reviewed Medications In EMR: Yes  Reviewed Allergies In EMR: Yes  Reviewed Surgeries / Procedures: Yes  Date of Onset of Symptoms: 11/25/2012  Treatments Tried: Kaopectate, Imodium  Treatments Tried Worked: No  Guideline(s) Used:  Diarrhea  Disposition Per Guideline:   See Within 3 Days in Office  Reason For Disposition Reached:   Diarrhea persists > 7 days  Advice Given:  Fluids:  Drink more fluids, at least 8-10 glasses (8 oz or 240 ml) daily.  Supplement this with saltine crackers or soups to make certain that you are getting sufficient fluid and salt to meet your body's needs.  Avoid caffeinated beverages (Reason: caffeine is mildly dehydrating).  Nutrition:  Ideal initial foods include boiled starches/cereals (e.g., potatoes, rice, noodles, wheat, oats) with a small amount of salt to taste.  Other acceptable foods include: bananas, yogurt, crackers, soup.  Call Back If:  Signs of dehydration occur (e.g., no urine for more than 12 hours, very dry mouth, lightheaded, etc.)  You become worse.  Patient Will Follow Care Advice:  YES  Appointment Scheduled:  12/27/2012 14:00:00 Appointment Scheduled Provider:  Artist Pais, Doe-Hyun Molly Maduro) (Adults only)

## 2012-12-27 ENCOUNTER — Ambulatory Visit (INDEPENDENT_AMBULATORY_CARE_PROVIDER_SITE_OTHER): Payer: Medicare Other | Admitting: Internal Medicine

## 2012-12-27 ENCOUNTER — Encounter: Payer: Self-pay | Admitting: Internal Medicine

## 2012-12-27 VITALS — BP 152/80 | Temp 98.1°F | Wt 252.0 lb

## 2012-12-27 DIAGNOSIS — K146 Glossodynia: Secondary | ICD-10-CM | POA: Insufficient documentation

## 2012-12-27 DIAGNOSIS — R197 Diarrhea, unspecified: Secondary | ICD-10-CM | POA: Insufficient documentation

## 2012-12-27 LAB — CBC WITH DIFFERENTIAL/PLATELET
Basophils Relative: 0.6 % (ref 0.0–3.0)
Eosinophils Absolute: 0.1 10*3/uL (ref 0.0–0.7)
Hemoglobin: 15.3 g/dL (ref 13.0–17.0)
Lymphs Abs: 2.3 10*3/uL (ref 0.7–4.0)
MCHC: 33.6 g/dL (ref 30.0–36.0)
MCV: 83.9 fl (ref 78.0–100.0)
Monocytes Absolute: 0.7 10*3/uL (ref 0.1–1.0)
Neutro Abs: 4.2 10*3/uL (ref 1.4–7.7)
RBC: 5.44 Mil/uL (ref 4.22–5.81)

## 2012-12-27 LAB — BASIC METABOLIC PANEL
CO2: 26 mEq/L (ref 19–32)
Chloride: 104 mEq/L (ref 96–112)
Creatinine, Ser: 1 mg/dL (ref 0.4–1.5)
Sodium: 141 mEq/L (ref 135–145)

## 2012-12-27 MED ORDER — NYSTATIN 100000 UNIT/ML MT SUSP: 500000.0000 [IU] | Freq: Four times a day (QID) | OROMUCOSAL | Status: DC

## 2012-12-27 MED ORDER — COLESEVELAM HCL 625 MG PO TABS: 625.0000 mg | ORAL_TABLET | Freq: Three times a day (TID) | ORAL | Status: DC

## 2012-12-27 NOTE — Assessment & Plan Note (Signed)
Question oral candidiasis. Trial of nystatin suspension

## 2012-12-27 NOTE — Assessment & Plan Note (Signed)
Patient experiencing frequent loose stools since abruptly discontinuing Effexor. This likely experiencing withdrawal effect. Rule out infectious etiology. Check stools for C. Difficile, stool culture and stool lactoferrin. Check CBCD and BMET.  Use Welchol bid with meals for now.  Reassess in 2 weeks.

## 2012-12-27 NOTE — Progress Notes (Addendum)
Subjective:    Patient ID: Sean Hill, male    DOB: Mar 13, 1941, 72 y.o.   MRN: [id number]  HPI  72 year old white male with history of mood disorder for hospital followup. Patient had psychiatric admission on 12/04/2012. Patient presented with increasing depression and suicidal thoughts. Patient reports her suffered from numerous side effects from his psychiatric medications. He did not think he he was diagnosed correctly bipolar disorder. Patient wanted to taper himself off medications.  Patient's medications adjusted.  Patient advised to taper off Effexor over one year. However patient abruptly stopped medication. Since then she is experienced diarrhea. He is having 3 or 4 loose stools per day. He also reports abdominal distention, gurgling and gas. He denies any recent use of antibiotics. He has a bowel movement 30 minutes after eating a meal. He reports foul odor to his bowel movements.  Patient also complains of soreness of his tongue. Patient had shallow ulcers on his of his mouth 2 weeks ago. This has improved. Review of Systems Negative for abdominal pain, negative for bloody diarrhea  Past Medical History  Diagnosis Date  . Bipolar disorder     Has psychiatrist.  BH admission (suicidal) 07/20/11.  Marland Kitchen Hyperlipemia     Intol of meds: GI side effects  . 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  No hx of MRSA infection.  . 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 Nephrolithiasis   . GERD (gastroesophageal reflux disease)   . Inguinal hernia 02/25/11    Left sided hernia with fat  . Hip pain, left 2011-2012    Left hip and groin: MRI pelvis and left hip 02/18/11 showed NORMAL hip, with small inguinal hernia containing fat and sigmoid diverticulosis.  Hip pain presumably referred pain from hernia/diverticular dz??.  . Cervical spondylosis     Primarily C4-5 and C5-6--referred to [facility] 02/2011.  .  Depression   . Bipolar disorder, current episode depressed, moderate   . Sleep apnea   . Asthma     History   Social History  . Marital Status: Married    Spouse Name: N/A    Number of Children: N/A  . Years of Education: N/A   Occupational History  . Not on file.   Social History Main Topics  . Smoking status: Never Smoker   . Smokeless tobacco: Not on file  . Alcohol Use: No     Comment: One drink rarely  . Drug Use: No  . Sexually Active: Yes    Birth Control/ Protection: None     Comment: Same partner for 45 years   Other Topics Concern  . Not on file   Social History Narrative   Married, lives in [place].  Retired Quarry manager.   Two daughters, both married, 2 grandchildren.   No regular exercise.  Never smoker.  No ETOH.  No drugs.    Past Surgical History  Procedure Laterality Date  . 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 Ankle fracture surgery      hardware still in both ankles (titanium)  . Inguinal hernia repair      Right side with mesh about 1993  . Elbow surgery      left---tendon  . Ankle surgery      Ankle tendon surgery  . Tonsillectomy    . Appendectomy      Family History  Problem  Relation Age of Onset  . Alcohol abuse Mother   . Alcohol abuse Father   . Depression Daughter   . Paranoid behavior Daughter     Allergies  Allergen Reactions  . Penicillins Hives and Other (See Comments)    Watery blisters    Current Outpatient Prescriptions on File Prior to Visit  Medication Sig Dispense Refill  . gabapentin (NEURONTIN) 300 MG capsule Take 1 capsule (300 mg total) by mouth 3 (three) times daily.  90 capsule  0  . lamoTRIgine (LAMICTAL) 100 MG tablet Take 1 tablet (100 mg total) by mouth daily.  30 tablet  0  . omeprazole (PRILOSEC) 40 MG capsule Take 1 capsule (40 mg total) by mouth 2 (two) times daily.  1 capsule  0  . traZODone (DESYREL) 100 MG tablet Take 1 tablet (100 mg total) by mouth at bedtime as  needed for sleep.  30 tablet  0   No current facility-administered medications on file prior to visit.    BP 152/80  Temp(Src) 98.1 F (36.7 C) (Oral)  Wt 252 lb (114.306 kg)  BMI 38.33 kg/m2       Objective:   Physical Exam  Constitutional: He is oriented to person, place, and time. He appears well-developed and well-nourished.  HENT:  Head: Normocephalic and atraumatic.  Slight whitish film on tongue,  No visible sores or ulcers, "cracked" appearance of tongue,   Cardiovascular: Normal rate, regular rhythm and normal heart sounds.   Pulmonary/Chest: Effort normal and breath sounds normal.  Abdominal: Soft. Bowel sounds are normal. He exhibits no mass. There is no tenderness.  Musculoskeletal: He exhibits no edema.  Neurological: He is alert and oriented to person, place, and time.  Skin: Skin is warm and dry.  Psychiatric: He has a normal mood and affect. His behavior is normal.          Assessment & Plan:

## 2013-01-07 ENCOUNTER — Encounter (HOSPITAL_COMMUNITY): Payer: Self-pay

## 2013-01-07 ENCOUNTER — Telehealth: Payer: Self-pay | Admitting: Internal Medicine

## 2013-01-07 NOTE — Telephone Encounter (Signed)
Patient called stating that he would like a call back with stool sample results. Please assist.

## 2013-01-07 NOTE — Telephone Encounter (Signed)
Gave pt negative stool results

## 2013-01-08 ENCOUNTER — Ambulatory Visit (INDEPENDENT_AMBULATORY_CARE_PROVIDER_SITE_OTHER): Payer: Medicare Other | Admitting: Psychiatry

## 2013-01-08 DIAGNOSIS — F319 Bipolar disorder, unspecified: Secondary | ICD-10-CM

## 2013-01-09 NOTE — Progress Notes (Signed)
   THERAPIST PROGRESS NOTE  Session Time: 3:00-3:50 pm  Participation Level: Active  Behavioral Response: Neat and Well GroomedAlertAnxious  Type of Therapy: Individual Therapy  Treatment Goals addressed: Anxiety and Coping  Interventions: CBT and Solution Focused  Summary: Sean Hill is a 72 y.o. male who presents with high anxious mood, affect and behvior. Patient listed symptoms that he is experiencing that he feels are getting "unmanagable". He was unaware that the symptoms were connected to his anxiety and manic behavior returning now that he is unmediated. He was open to hearing writers opinion however. Patient states his impulsivity and annoyance is very high and he has started yelling at his wife again. He states he says very mean and hurtful things to her and then regrets his behavior. Patient states he can't use his meditation effectively because he is too "anixous". Patient agreed he needs to meet with his doctor and discuss medication options that can regain his stability to allow him to engage his coping skills. Patient agreed to Higher education careers adviser with his next medical appointment date.   Suicidal/Homicidal: Nowithout intent/plan  Therapist Response: Assessed patients increase in symptoms and explored causes. Reviewed the effectiveness of coping skills and encouraged patient to immediately seek medical consultation and explore resuming a medication regimen.   Plan: Return again in 1 weeks.  Diagnosis: Axis I: Bipolar Disorder    Axis II: No diagnosis    Carman Ching, LCSW 01/09/2013

## 2013-01-18 ENCOUNTER — Ambulatory Visit (INDEPENDENT_AMBULATORY_CARE_PROVIDER_SITE_OTHER): Payer: Medicare Other | Admitting: Psychiatry

## 2013-01-18 DIAGNOSIS — F319 Bipolar disorder, unspecified: Secondary | ICD-10-CM

## 2013-01-18 NOTE — Progress Notes (Signed)
   THERAPIST PROGRESS NOTE  Session Time: 2:00-2:50 pm  Participation Level: Active  Behavioral Response: CasualAlertAnxious and Manic  Type of Therapy: Individual Therapy  Treatment Goals addressed: Coping  Interventions: Solution Focused  Summary: Sean Hill is a 72 y.o. male who presents with high anxiety and mania symptoms. Patient stated he can't continue with these symptoms anymore. He denies thought to harm self or of suicide, but states his irritability is increasing to the point that he is in constant conflict with his wife and now others. He is difficult to be around because of his anger and impulsivity. He states he has called several psychiatrists and none of them are currently accepting his [employer]. Writer also contacted t hem during the session and could not find a local provider. Patient states he wants to be able to manage the irritability and impulsivity and not feel "in a fog". He agreed to see Dr. Lolly Mustache and scheduled a new patient appointment. He contracted for safety and gave Clinical research associate his weekend plans.   Suicidal/Homicidal: Nowithout intent/plan  Therapist Response: Assessed anxiety, depression and mania symptoms, completed a safety assessment, Writer referred patient to Dr. Lolly Mustache within the practice for a new patient appointment.   Plan: Return again in 2 weeks.  Diagnosis: Axis I: Bipolar Disorder    Axis II: No diagnosis    Bianka Liberati E, LCSW 01/18/2013

## 2013-01-28 ENCOUNTER — Ambulatory Visit (INDEPENDENT_AMBULATORY_CARE_PROVIDER_SITE_OTHER): Payer: No Typology Code available for payment source | Admitting: Psychiatry

## 2013-01-28 DIAGNOSIS — F319 Bipolar disorder, unspecified: Secondary | ICD-10-CM

## 2013-01-28 NOTE — Progress Notes (Signed)
   THERAPIST PROGRESS NOTE  Session Time: 3:00-3:50 pm  Participation Level: Active  Behavioral Response: Neat and Well GroomedAlertEuthymic  Type of Therapy: Individual Therapy  Treatment Goals addressed: Coping  Interventions: Solution Focused, Strength-based and Supportive  Summary: Sean Hill is a 72 y.o. male who presents with euthymic mood and affect. Patient reports a reduction in manic like symptoms with less irritability and more patience. He states he feels less "reved up" and getting closer to his normal, although still experiencing some anxiety. Patient talks about the future and buying a town home with his wife and brought pictures to show the new home they purchased. Patient is hopeful about his appointment tomorrow with Dr. Lolly Mustache and hopes to be able to find medications to help him balance his mood with less fluctuation.    Suicidal/Homicidal: Nowithout intent/plan  Therapist Response: Assessed overall level of functioning, explored causes for reduced irritability and used cbt to challenge negative distorted thinking.  Plan: Return again in 2  weeks.  Diagnosis: Axis I: Bipolar Disorder    Axis II: No diagnosis    Carman Ching, LCSW 01/28/2013

## 2013-01-29 ENCOUNTER — Encounter (HOSPITAL_COMMUNITY): Payer: Self-pay | Admitting: Psychiatry

## 2013-01-29 ENCOUNTER — Ambulatory Visit (INDEPENDENT_AMBULATORY_CARE_PROVIDER_SITE_OTHER): Payer: No Typology Code available for payment source | Admitting: Psychiatry

## 2013-01-29 VITALS — BP 142/73 | HR 80 | Ht 69.0 in | Wt 256.0 lb

## 2013-01-29 DIAGNOSIS — F319 Bipolar disorder, unspecified: Secondary | ICD-10-CM

## 2013-01-29 MED ORDER — LAMOTRIGINE 150 MG PO TABS: 150.0000 mg | ORAL_TABLET | Freq: Every day | ORAL | Status: DC

## 2013-01-29 NOTE — Progress Notes (Signed)
Patient ID: Sean Hill, male   DOB: 11-Mar-1941, 72 y.o.   MRN: [id number] Psychiatric assessment note.  Chief complaint. I like to start getting my medication from this office.  History of present illness. Patient is 72 year old Caucasian retired married man who is referred from his therapist Sean Hill for the management of his bipolar disorder.  [facility]  He was experiencing side effects due to polypharmacy, having depression and anxiety symptoms and difficulty to concentrate.  He was hoping that his medication will be adjusted.  At that time he was taking at least 6 psychotropic medication.  He was taking Effexor, Klonopin, Lamictal, Wellbutrin, Neurontin and amitriptyline.  He was discharged on Wellbutrin gabapentin Lamictal and trazodone.  Patient is started to come off from the Wellbutrin on his own.  He is still feeling very drowsy and difficulty to concentrate.  Since he is coming off from the medication he is more clear in his thinking however he started to have anxiety and nervousness.  Patient has seen in this office by [name] until a few years ago.  Patient told he was not happy and decided to see [name] who started him on Neurontin Klonopin and increase the dose of Effexor.  Patient currently has some symptoms of depression and anxiety.  He's sleeping okay.  He denies any paranoia or any hallucination.  He has some mood irritability and anger.  He really wants to come off from Wellbutrin but he is also very anxious that he may decompensate into his illness.  Patient does not drink or use any illegal substance.  Patient denies any paranoia or any hallucination.  He denies any aggression or violence.  Denies any active or passive suicidal thoughts.  He is not taking Klonopin Effexor at this time.  Past psychiatric history. Patient has at least 3 psychiatric hospitalization.  He has a history of taking overdose on his  medication and mixing with alcohol.  He has a history of cutting his wrist.  He is taking his psychiatric medication for past 8 years.  In the past he has seen [name] and [name].  As per chart he has taken in the past Tegretol, Depakote, Ambien, Geodon, Abilify, lithium, Provigil, Zoloft, Risperdal and Prstiq.  Patient do not recall very well the previous response of these medication.  However he felt taking all these medications together was making him very drowsy groggy and unable to function.  Patient endorsed history of mania and severe impulsive behavior.  He admitted history of buying unnecessary, getting speeding tickets and aggression.  Legal history. The patient has no current legal issues.  History of abuse. Patient denies any history of physical sexual verbal and emotional abuse.  Medical history. Patient has high triglycerides, irritable bowel syndrome, chronic pain, GERD.  He is not taking medication for his high triglycerides.  Patient denies any history of seizures, blackouts, traumatic brain injury or any loss of consciousness.  Family history. Patient endorse parent's were alcoholic, younger daughter suffers from depression, nephew died due to suicide.  Daughter has been admitted at least twice to [facility] and once [facility].  Education and work history. Patient has a high school education.  Currently he is retired.  Alcohol and substance use history. Patient admitted history of alcohol or any illegal substance use.  Psychosocial history. Patient is born and raised in [place].  His been married once.  He has 2 daughters.  Patient lives with his wife.  His daughter lives close by.  He has good relationship with his daughter.  Review of Systems  Constitutional: Positive for malaise/fatigue.  HENT: Positive for neck pain.   Gastrointestinal: Positive for heartburn.  Skin: Negative.   Neurological: Positive for weakness. Negative for dizziness,  tingling, tremors, sensory change, speech change, focal weakness, seizures and loss of consciousness.  Psychiatric/Behavioral: Positive for depression. Negative for suicidal ideas, hallucinations, memory loss and substance abuse. The patient is nervous/anxious and has insomnia.    Filed Vitals:   01/29/13 1330  BP: 142/73  Pulse: 80   Mental status examination Patient is a middle aged man who is getting shorts and T-shirt.  He is cooperative and maintained fair eye contact.  He has multiple tattoos on his arm and legs.  His speech is slow but clear and coherent.  His thought processes logical and goal-directed.  She denies any active versus is a partial homicidal thoughts.  He described his mood as anxious and his affect is mood appropriate.  He denies any auditory or visual hallucination.  There were no paranoia or delusion obsession present at this time.  His fund of knowledge is adequate.  His psychomotor activity is okay.  His memory is okay.  He has no difficulty in recalling recent events however some difficulty recalling old events.  There were no tremors or shakes present at this time.  She's alert and attentive x3.  His insight judgment and impulse control is okay.  Assessment  Axis I bipolar disorder NOS Axis II deferred  Axis III  Patient Active Problem List   Diagnosis Date Noted  . Erectile dysfunction 03/28/2012  . Microscopic hematuria 11/28/2011  . Gastroesophageal reflux disease 11/28/2011  . Bipolar disorder 11/25/2011  . 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 Irritable bowel 05/19/2011  . Neck pain, chronic 03/08/2011   Axis IV mild to moderate  Axis V 60-65  Plan I reviewed his symptoms, history, current medication and response to the medication.  Patient feels that polypharmacy is giving him decreased attention concentration and impairment in his functionality.  He wants to come off from his Wellbutrin.  I recommend to take Lamictal 150 mg to help his mood lability and he can  stop Wellbutrin.  Recommend continued trazodone 100 mg at bedtime for his insomnia.  He prefers mono therapy in the future.  He will see Sean Hill for coping and social skills.  We discussed in detail risk and benefits of medication including sedation, otherwise syndrome EPS.  Safety plan discussed in anytime having active suicidal thoughts or homicidal thoughts and he to call 911 or go to local emergency room.  Time spent 60 minutes.  I will see him again in 2 weeks.

## 2013-02-08 ENCOUNTER — Ambulatory Visit (INDEPENDENT_AMBULATORY_CARE_PROVIDER_SITE_OTHER): Payer: No Typology Code available for payment source | Admitting: Psychiatry

## 2013-02-08 DIAGNOSIS — F319 Bipolar disorder, unspecified: Secondary | ICD-10-CM

## 2013-02-08 NOTE — Progress Notes (Signed)
   THERAPIST PROGRESS NOTE  Session Time: 3:00-3:50 pm  Participation Level: Active  Behavioral Response: CasualAlertAnxious and manic  Type of Therapy: Individual Therapy  Treatment Goals addressed: Coping  Interventions: Solution Focused and Supportive  Summary: Sean Hill is a 72 y.o. male who presents with anxious mood and affect and reports symptoms of mania. Patient states he continues to be impulsive with how he talks to his wife and engages in arguments when trying to defend his point of view. Pt said he gets easily annoyed and is unable to see how defensive he is until later when his adrenaline goes down. Patient expressed this as his main concern that he plans to address next week with his psychiatrist. Patient said he does not have thoughts of suicide and is enjoying his ability to think and concentrate. Pt acknowledge his ability to remain stable despite his manic symptoms. Pt expressed stress associated with moving into his town home next week. Pt agreed to resume his meditation and heartmath to manage anxiety symptoms.     Suicidal/Homicidal: Nowithout intent/plan  Therapist Response: Assessed depression and mania symptoms, explored current stressors and coping skills for anxiety management. Acknowledge progress with maintaining wellness and not having suicidal thoughts.   Plan: Return again in 1 weeks.  Diagnosis: Axis I: bipolar disorder    Axis II: No diagnosis    Carman Ching, LCSW 02/08/2013

## 2013-02-12 ENCOUNTER — Ambulatory Visit (INDEPENDENT_AMBULATORY_CARE_PROVIDER_SITE_OTHER): Payer: No Typology Code available for payment source | Admitting: Psychiatry

## 2013-02-12 ENCOUNTER — Encounter (HOSPITAL_COMMUNITY): Payer: Self-pay | Admitting: Psychiatry

## 2013-02-12 VITALS — BP 129/67 | HR 77 | Ht 69.0 in | Wt 256.0 lb

## 2013-02-12 DIAGNOSIS — F319 Bipolar disorder, unspecified: Secondary | ICD-10-CM

## 2013-02-12 MED ORDER — LAMOTRIGINE 200 MG PO TABS: 200.0000 mg | ORAL_TABLET | Freq: Every day | ORAL | Status: DC

## 2013-02-12 MED ORDER — RISPERIDONE 1 MG PO TABS: 1.0000 mg | ORAL_TABLET | Freq: Every day | ORAL | Status: DC

## 2013-02-12 NOTE — Progress Notes (Signed)
Sean Hill [id number] Progress Note  Sean Hill [id number] 72 y.o.  02/12/2013 10:58 AM  Chief Complaint:  I still has a lot of irritability and agitation.  History of Present Illness: Patient is 72 year old Caucasian retired man who came for his followup appointment.  He was seen first time on May 13.  His medications were adjusted.  He stopped taking Wellbutrin on his own because he was feeling very dizzy.  Patient no longer complained of dizziness.  His attention and concentration is improved however he continues to have irritability anger and mood swing.  He admitted that he has manic cycles.  He also did relapse into drinking.  He drinks 2 bottles of wine to 4 times a week.  He denies any intoxication but admitted drinking to calm himself.  He continues to take Neurontin 300 mg twice a day despite recommended to take 3 times a day and trazodone 100 mg at bedtime.  We increased limited to on his last visit however he has not seen any much improvement.  He denies any hallucination or any active or passive suicidal thinking.    Suicidal Ideation: No Plan Formed: No Patient has means to carry out plan: No  Homicidal Ideation: No Plan Formed: No Patient has means to carry out plan: No  Review of Systems  Constitutional: Positive for malaise/fatigue.  HENT: Positive for neck pain. Negative for hearing loss and tinnitus.   Gastrointestinal: Positive for heartburn.  Musculoskeletal: Positive for back pain.  Neurological: Negative.  Negative for headaches.  Psychiatric/Behavioral: Positive for depression and substance abuse. Negative for hallucinations. The patient is nervous/anxious. The patient does not have insomnia.     Psychiatric: Agitation: Yes Hallucination: No Depressed Mood: Yes Insomnia: No Hypersomnia: No Altered Concentration: No Feels Worthless: Yes Grandiose Ideas: No Belief In Special Powers: No New/Increased Substance Abuse: Yes Compulsions:  No  Neurologic: Headache: No Seizure: No Paresthesias: No  Medical History:  Patient has a history of high triglycerides, irritable bowel syndrome, chronic pain, GERD.  He denies any history of seizures blackouts, traumatic brain injury or any loss of consciousness.  Legal history.  The patient has no current legal issues.   History of abuse.  Patient denies any history of physical sexual verbal and emotional abuse.   Medical history.  Patient has high triglycerides, irritable bowel syndrome, chronic pain, GERD. He is not taking medication for his high triglycerides. Patient denies any history of seizures, blackouts, traumatic brain injury or any loss of consciousness.   Family history.  Patient endorse parent's were alcoholic, younger daughter suffers from depression, nephew died due to suicide. Daughter has been admitted at least twice to [facility] and once [facility].   Education and work history.  Patient has a high school education. Currently he is retired.   Alcohol and substance use history.  Patient admitted history of alcohol or any illegal substance use.   Psychosocial history.  Patient is born and raised in [place]. His been married once. He has 2 daughters. Patient lives with his wife. His daughter lives close by. He has good relationship with his daughter.  Outpatient Encounter Prescriptions as of 02/12/2013  Medication Sig Dispense Refill  . colesevelam (WELCHOL) 625 MG tablet Take 1 tablet (625 mg total) by mouth 3 (three) times daily with meals.  90 tablet  1  . gabapentin (NEURONTIN) 300 MG capsule Take 1 capsule (300 mg total) by mouth 3 (three) times daily.  90 capsule  0  .  lamoTRIgine (LAMICTAL) 200 MG tablet Take 1 tablet (200 mg total) by mouth daily.  30 tablet  0  . omeprazole (PRILOSEC) 40 MG capsule Take 1 capsule (40 mg total) by mouth 2 (two) times daily.  1 capsule  0  . traZODone (DESYREL) 100 MG tablet Take 1 tablet (100 mg total)  by mouth at bedtime as needed for sleep.  30 tablet  0  . [DISCONTINUED] lamoTRIgine (LAMICTAL) 150 MG tablet Take 1 tablet (150 mg total) by mouth daily.  30 tablet  0  . risperiDONE (RISPERDAL) 1 MG tablet Take 1 tablet (1 mg total) by mouth at bedtime.  30 tablet  0   No facility-administered encounter medications on file as of 02/12/2013.    Past Psychiatric History/Hospitalization(s): Patient has at least 3 psychiatric hospitalization. He has a history of taking overdose on his medication and mixing with alcohol. He has a history of cutting his wrist. He is taking his psychiatric medication for past 8 years. In the past he has seen [name] and [name]. As per chart he has taken in the past Tegretol, Depakote, Ambien, Geodon, Abilify, lithium, Provigil, Zoloft, Risperdal and Prstiq. Patient do not recall very well the previous response of these medication. However he felt taking all these medications together was making him very drowsy groggy and unable to function. Patient endorsed history of mania and severe impulsive behavior. He admitted history of buying unnecessary, getting speeding tickets and aggression.  [facility] was [facility] in March 2014.  At that time he was experiencing side effects due to polypharmacy.  He was discharged on trazodone Neurontin Wellbutrin however he stopped taking Wellbutrin on his own and cut down his Neurontin from 600 mg to 300 mg twice a day.  Anxiety: Yes Bipolar Disorder: Yes Depression: Yes Mania: Yes Psychosis: Yes Schizophrenia: No Personality Disorder: No Hospitalization for psychiatric illness: Yes History of Electroconvulsive Shock Therapy: No Prior Suicide Attempts: Yes  Physical Exam: Constitutional:  BP 129/67  Pulse 77  Ht 5\' 9"  (1.753 m)  Wt 256 lb (116.121 kg)  BMI 37.79 kg/m2  General Appearance: well nourished and obese  Musculoskeletal: Strength & Muscle Tone: within normal limits Gait &  Station: normal Patient leans: N/A  Psychiatric: Speech (describe rate, volume, coherence, spontaneity, and abnormalities if any): Slow but clear and coherent.  Thought Process (describe rate, content, abstract reasoning, and computation): Logical and goal-directed.  Associations: Relevant and Intact  Thoughts: normal  Mental Status: Orientation: oriented to person, place, time/date, situation, day of week, month of year and year Mood & Affect: depressed affect, anxiety and labile affect Attention Span & Concentration: Fair  Medical Decision Making (Choose Three): Review of Psycho-Social Stressors (1), Established Problem, Worsening (2), Review of Last Therapy Session (1), Review of Medication Regimen & Side Effects (2) and Review of New Medication or Change in Dosage (2)  Assessment: Axis I: Bipolar disorder NOS  Axis II: Deferred  Axis III:  Patient Active Problem List   Diagnosis Date Noted  . Erectile dysfunction 03/28/2012  . Microscopic hematuria 11/28/2011  . Gastroesophageal reflux disease 11/28/2011  . Bipolar disorder 11/25/2011  . 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 Irritable bowel 05/19/2011  . Neck pain, chronic 03/08/2011    Axis IV: Moderate  Axis V: 60-65   Plan: I reviewed his symptoms, his current medication and response to medication.  I also discuss about his relapse into drinking.  I would increase his Lamictal to 200 mg  daily.  Patient is not experiencing any rash or itching.  I also discussed that he should tried respiratory 1 mg at bedtime.  In the past he remembered having sedation with the Risperdal but at the same time he was taking multiple psychotropic medication.  I recommend if he started to feel very sleepy and groggy then he can take half tablet otherwise he should try 1 mg.  He is taking a Neurontin 300 mg twice a day which I consider a low dose and recommend to try to 3 times a day.  He will continue trazodone if needed for insomnia.  He scheduled  to see therapist for coping and social skills.  Recommend to call us back if he is any question of conservatively worsening of the symptom.  Explained the side effects in detail especially use of drinking can interact with his psychotropic medication.  Safety plan discussed.  I will see him again in 3 weeks.  Time spent 25 minutes.  More than 50% of the time spent in psychoeducation counseling and coordination of care.  Acire Tang T., MD 02/12/2013

## 2013-02-13 ENCOUNTER — Emergency Department (INDEPENDENT_AMBULATORY_CARE_PROVIDER_SITE_OTHER): Payer: Medicare Other

## 2013-02-13 ENCOUNTER — Emergency Department (HOSPITAL_COMMUNITY)
Admission: EM | Admit: 2013-02-13 | Discharge: 2013-02-13 | Disposition: A | Discharge status: Home / Self Care | Payer: Medicare Other | Source: Home / Self Care | Attending: Family Medicine | Admitting: Family Medicine

## 2013-02-13 ENCOUNTER — Encounter (HOSPITAL_COMMUNITY): Payer: Self-pay | Admitting: Emergency Medicine

## 2013-02-13 DIAGNOSIS — S9030XA Contusion of unspecified foot, initial encounter: Secondary | ICD-10-CM

## 2013-02-13 DIAGNOSIS — S9031XA Contusion of right foot, initial encounter: Secondary | ICD-10-CM

## 2013-02-13 NOTE — ED Provider Notes (Signed)
History     CSN: [id number]  Arrival date & time 02/13/13  1431   First MD Initiated Contact with Patient 02/13/13 1539      Chief Complaint  Patient presents with  . Foot Injury    (Consider location/radiation/quality/duration/timing/severity/associated sxs/prior treatment) HPI Comments: 72 year old male with history of mood/psychiatric disorder among other comorbidities. Here complaining of right foot pain for 4 days. Patient describes as he injured his right food while moving and kicking a box out of the way with his right foot. States that he has prior surgery and hardware on the right foot. Has used ice and elevation with some relief. Patient prefers not to take any pain medications. He is concerned about possible hardware damage or bone fractures. Weight bearing but pain in small toe with walking.    Past Medical History  Diagnosis Date  . Bipolar disorder     Has psychiatrist.  [facility] admission (suicidal) 07/20/11.  Marland Kitchen Hyperlipemia     Intol of meds: GI side effects  . 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  No hx of MRSA infection.  . 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 Nephrolithiasis   . GERD (gastroesophageal reflux disease)   . Inguinal hernia 02/25/11    Left sided hernia with fat  . Hip pain, left 2011-2012    Left hip and groin: MRI pelvis and left hip 02/18/11 showed NORMAL hip, with small inguinal hernia containing fat and sigmoid diverticulosis.  Hip pain presumably referred pain from hernia/diverticular dz??.  . Cervical spondylosis     Primarily C4-5 and C5-6--referred to [facility] 02/2011.  . Depression   . Bipolar disorder, current episode depressed, moderate   . Sleep apnea   . Asthma     Past Surgical History  Procedure Laterality Date  . 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 Ankle fracture surgery      hardware still in both ankles (titanium)  .  Inguinal hernia repair      Right side with mesh about 1993  . Elbow surgery      left---tendon  . Ankle surgery      Ankle tendon surgery  . Tonsillectomy    . Appendectomy      Family History  Problem Relation Age of Onset  . Alcohol abuse Mother   . Alcohol abuse Father   . Depression Daughter   . Paranoid behavior Daughter     History  Substance Use Topics  . Smoking status: Never Smoker   . Smokeless tobacco: Not on file  . Alcohol Use: No     Comment: One drink rarely      Review of Systems  Constitutional: Negative for fever and chills.  Musculoskeletal:       Right foot pain as per HPI  Skin: Negative for wound.  All other systems reviewed and are negative.    Allergies  Penicillins  Home Medications   Current Outpatient Rx  Name  Route  Sig  Dispense  Refill  . lamoTRIgine (LAMICTAL) 200 MG tablet   Oral   Take 1 tablet (200 mg total) by mouth daily.   30 tablet   0     Please see change in dose   . risperiDONE (RISPERDAL) 1 MG tablet   Oral   Take 1 tablet (1 mg total) by mouth at bedtime.   30 tablet  0   . traZODone (DESYREL) 100 MG tablet   Oral   Take 1 tablet (100 mg total) by mouth at bedtime as needed for sleep.   30 tablet   0   . colesevelam (WELCHOL) 625 MG tablet   Oral   Take 1 tablet (625 mg total) by mouth 3 (three) times daily with meals.   90 tablet   1   . gabapentin (NEURONTIN) 300 MG capsule   Oral   Take 1 capsule (300 mg total) by mouth 3 (three) times daily.   90 capsule   0   . omeprazole (PRILOSEC) 40 MG capsule   Oral   Take 1 capsule (40 mg total) by mouth 2 (two) times daily.   1 capsule   0     BP 133/72  Pulse 69  Temp(Src) 98.1 F (36.7 C) (Oral)  Resp 16  SpO2 98%  Physical Exam  Nursing note and vitals reviewed. Constitutional: He is oriented to person, place, and time. He appears well-developed and well-nourished.  Cardiovascular: Normal heart sounds.   Pulmonary/Chest: Breath  sounds normal.  Musculoskeletal:  Right foot: tenderness to palpation in mid lateral dorsum of foot. Also reported tenderness at distal 5th metatarsal area. No obvious bruising or swelling.   Neurological: He is alert and oriented to person, place, and time.  Skin:  No wounds  Psychiatric: He has a normal mood and affect. His behavior is normal. Judgment and thought content normal.    ED Course  Procedures (including critical care time)  Labs Reviewed - No data to display Dg Foot Complete Right  02/13/2013   *RADIOLOGY REPORT*  Clinical Data: Right foot injury.  RIGHT FOOT COMPLETE - 3+ VIEW  Comparison: None.  Findings: No acute fracture or dislocation is identified.  There is evidence of prior hind foot surgery with tarsal fusion and hardware present.  No abnormal lucency is seen around indwelling surgical hardware.  Soft tissues are unremarkable.  IMPRESSION: No acute findings.   Original Report Authenticated By: Irish Lack, M.D.     1. Foot contusion, right, initial encounter       MDM  Placed on rigid soled shoe.  Supportive care and red flags that should prompt his return to medical attention discussed with patient and provided in writing.         Sharin Grave, MD 02/15/13 [id number]

## 2013-02-13 NOTE — ED Notes (Signed)
Pt c/o right foot injury x 4 days ago. Was moving and kicked a box out of the way. Has hx of ankle and bone fusion in the same foot. Pinky toe turned black and was very painful. Has used ice and tried to keep it elevated with mild relief. Patient is alert and oriented.

## 2013-02-14 ENCOUNTER — Ambulatory Visit (HOSPITAL_COMMUNITY): Payer: Self-pay | Admitting: Psychiatry

## 2013-02-18 ENCOUNTER — Telehealth: Payer: Self-pay | Admitting: Internal Medicine

## 2013-02-18 NOTE — Telephone Encounter (Signed)
Error/kh 

## 2013-02-20 ENCOUNTER — Ambulatory Visit (INDEPENDENT_AMBULATORY_CARE_PROVIDER_SITE_OTHER): Payer: Medicare Other | Admitting: Family Medicine

## 2013-02-20 ENCOUNTER — Encounter: Payer: Self-pay | Admitting: Family Medicine

## 2013-02-20 ENCOUNTER — Ambulatory Visit (INDEPENDENT_AMBULATORY_CARE_PROVIDER_SITE_OTHER): Payer: Medicare Other | Admitting: Psychiatry

## 2013-02-20 VITALS — BP 110/70 | Temp 98.0°F | Wt 261.0 lb

## 2013-02-20 DIAGNOSIS — F319 Bipolar disorder, unspecified: Secondary | ICD-10-CM

## 2013-02-20 DIAGNOSIS — M25579 Pain in unspecified ankle and joints of unspecified foot: Secondary | ICD-10-CM

## 2013-02-20 DIAGNOSIS — M25571 Pain in right ankle and joints of right foot: Secondary | ICD-10-CM

## 2013-02-20 DIAGNOSIS — I872 Venous insufficiency (chronic) (peripheral): Secondary | ICD-10-CM

## 2013-02-20 NOTE — Patient Instructions (Signed)
-  elevated legs above heart for 30 minutes twice daily  -compression stockings if you can  -ankle exercises daily  -follow up in 3-4 weeks

## 2013-02-20 NOTE — Progress Notes (Signed)
   THERAPIST PROGRESS NOTE  Session Time: 3:00-3:50 pm  Participation Level: Active  Behavioral Response: Casual, Neat and Well GroomedAlertDepressed  Type of Therapy: Individual Therapy  Treatment Goals addressed: Coping  Interventions: Strength-based and Supportive  Summary: Sean Hill is a 72 y.o. male who presents with moderate depressed mood and affect. Pt talked about the stress he is under while moving from his apartment into his new home and how difficult the move is due to his struggle with change. Pt expressed concern about his depression symptoms increasing and struggled to make the connection to this increase in symptoms to his life currently being in disorder. Pt denied thoughts of suicide and states he has been doing well while his wife has been out of town this week. Pt explored how his tendency to be introverted makes him feel awkward in society and contributes to feelings of depression. Pt explored how he will manage stress associated with the move and what coping skills are working for him.   Suicidal/Homicidal: Nowithout intent/plan  Therapist Response: Assessed Pts depression symptoms, explored and normalized stress associated with moving and discussed plans to travel to [place] as something Pt is using to look forward to.   Plan: Return again in 1 weeks.  Diagnosis: Axis I: Bipolar Disorder    Axis II: No diagnosis    Kalliopi Coupland E, LCSW 02/20/2013

## 2013-02-20 NOTE — Progress Notes (Signed)
Chief Complaint  Patient presents with  . Foot Injury    still has edema; had x-ray done not broken     HPI:  Acute visit for foot swelling after foot injury: -reports moved 3 weeks ago and kicked R foot into box -has had some pain and swelling in this foot since, has chronic swelling in ankles since knee and ankle surgeries -a little redness in skin at times -denies: fevers, chills, unable to bear weight, decreased ability to move foot -R foot pain is, 5/10,  in dorsal lateral area of foot - worse certain movements -had xrays last week and told no fracture -has hx of broken bones in both ankles with surgery - his prior orthopedic doctor retired  ROS: See pertinent positives and negatives per HPI.  Past Medical History  Diagnosis Date  . Bipolar disorder     Has psychiatrist.  BH admission (suicidal) 07/20/11.  Marland Kitchen Hyperlipemia     Intol of meds: GI side effects  . 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  No hx of MRSA infection.  . 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 Nephrolithiasis   . GERD (gastroesophageal reflux disease)   . Inguinal hernia 02/25/11    Left sided hernia with fat  . Hip pain, left 2011-2012    Left hip and groin: MRI pelvis and left hip 02/18/11 showed NORMAL hip, with small inguinal hernia containing fat and sigmoid diverticulosis.  Hip pain presumably referred pain from hernia/diverticular dz??.  . Cervical spondylosis     Primarily C4-5 and C5-6--referred to [facility] 02/2011.  . Depression   . Bipolar disorder, current episode depressed, moderate   . Sleep apnea   . Asthma     Family History  Problem Relation Age of Onset  . Alcohol abuse Mother   . Alcohol abuse Father   . Depression Daughter   . Paranoid behavior Daughter     History   Social History  . Marital Status: Married    Spouse Name: N/A    Number of Children: N/A  . Years of Education: N/A   Social  History Main Topics  . Smoking status: Never Smoker   . Smokeless tobacco: None  . Alcohol Use: No     Comment: One drink rarely  . Drug Use: No  . Sexually Active: Yes    Birth Control/ Protection: None     Comment: Same partner for 45 years   Other Topics Concern  . None   Social History Narrative   Married, lives in [place].  Retired Quarry manager.   Two daughters, both married, 2 grandchildren.   No regular exercise.  Never smoker.  No ETOH.  No drugs.    Current outpatient prescriptions:gabapentin (NEURONTIN) 300 MG capsule, Take 1 capsule (300 mg total) by mouth 3 (three) times daily., Disp: 90 capsule, Rfl: 0;  lamoTRIgine (LAMICTAL) 200 MG tablet, Take 1 tablet (200 mg total) by mouth daily., Disp: 30 tablet, Rfl: 0;  omeprazole (PRILOSEC) 40 MG capsule, Take 1 capsule (40 mg total) by mouth 2 (two) times daily., Disp: 1 capsule, Rfl: 0 risperiDONE (RISPERDAL) 1 MG tablet, Take 1 tablet (1 mg total) by mouth at bedtime., Disp: 30 tablet, Rfl: 0;  traZODone (DESYREL) 100 MG tablet, Take 1 tablet (100 mg total) by mouth at bedtime as needed for sleep., Disp: 30 tablet, Rfl: 0;  colesevelam (WELCHOL) 625 MG tablet, Take 1  tablet (625 mg total) by mouth 3 (three) times daily with meals., Disp: 90 tablet, Rfl: 1  EXAM:  Filed Vitals:   02/20/13 0857  BP: 110/70  Temp: 98 F (36.7 C)    Body mass index is 38.53 kg/(m^2).  GENERAL: vitals reviewed and listed above, alert, oriented, appears well hydrated and in no acute distress  HEENT: atraumatic, conjunttiva clear, no obvious abnormalities on inspection of external nose and ears  NECK: no obvious masses on inspection  MS: moves all extremities without noticeable abnormality -minimal erythema of pinky toe with some diffuse soft tissue TTP, bilat mild pitting edema and venous stasis changes in ankles/lower legs, no bony TTP, no signs  PSYCH: pleasant and cooperative, no obvious depression or anxiety  ASSESSMENT AND  PLAN:  Discussed the following assessment and plan:  Pain in joint, ankle and foot, right  Venous insufficiency (chronic) (peripheral)  -soft tissue injury suspected, resolving - conservative management -elevation and compression for chronic venous insufficiency s/p multiple surgeries -return precautions -Patient advised to return or notify a doctor immediately if symptoms worsen or persist or new concerns arise.  Patient Instructions  -elevated legs above heart for 30 minutes twice daily  -compression stockings if you can  -ankle exercises daily  -follow up in 3-4 weeks     Ival Basquez R.

## 2013-03-06 ENCOUNTER — Ambulatory Visit (INDEPENDENT_AMBULATORY_CARE_PROVIDER_SITE_OTHER): Payer: Medicare Other | Admitting: Psychiatry

## 2013-03-06 DIAGNOSIS — F319 Bipolar disorder, unspecified: Secondary | ICD-10-CM

## 2013-03-06 NOTE — Progress Notes (Signed)
   THERAPIST PROGRESS NOTE  Session Time: 3:00-3:50 pm  Participation Level: Active  Behavioral Response: Neat and Well GroomedAlertAnxious  Type of Therapy: Individual Therapy  Treatment Goals addressed: Coping  Interventions: Strength-based and Supportive  Summary: Sean Hill is a 72 y.o. male who presents with anxious mood and affect. Patient was ten minutes late for his session, which has never happened since Pt started therapy. Pt states his anxiety caused him to struggle with driving here today. Pt appeared to calm down as the session continued. Pt described stress associated with moving this past week and all the things that have sprang up that were unexpected and stress producing. Pt described feeling disappointed that after making the decision to go to [place] he will have to postpone the trip due to extra expenses popping up that took his savings. Pt overall is doing well with the transition and maintaining stable mood and functioning. Pt states he is even "enjoying the new house and gardening some".   Suicidal/Homicidal: Nowithout intent/plan  Therapist Response: Assessed overall level of depression, anxiety and mania symptoms. Explore coping skills to manage stressors with his mood and provided supportive therapy regarding the loss of Pts [place] trip and explored options to make this happen in the future.    Plan: Return again in 1 weeks.  Diagnosis: Axis I: Bipolar Disorder    Axis II: No diagnosis    Carman Ching, LCSW 03/06/2013

## 2013-03-11 ENCOUNTER — Ambulatory Visit (HOSPITAL_COMMUNITY): Payer: Self-pay | Admitting: Psychiatry

## 2013-03-13 ENCOUNTER — Ambulatory Visit (INDEPENDENT_AMBULATORY_CARE_PROVIDER_SITE_OTHER): Payer: Medicare Other | Admitting: Psychiatry

## 2013-03-13 DIAGNOSIS — F319 Bipolar disorder, unspecified: Secondary | ICD-10-CM

## 2013-03-13 NOTE — Progress Notes (Signed)
   THERAPIST PROGRESS NOTE  Session Time: 2:00-3:00 pm  Participation Level: Active  Behavioral Response: Neat and Well GroomedAlertDepressed  Type of Therapy: Individual Therapy  Treatment Goals addressed: Coping  Interventions: Solution Focused and Supportive  Summary: Sean Hill is a 72 y.o. male who presents with mild depressed mood and affect. Pt reports continued stable mood and ability mange both mania and depression symptoms. He indicated that he is able to feel some enjoyment and interest in things again and questions if it is because he is off some of the mental health medications he was on that was limiting his ability to feel emotions. Pt reports talking with his wife, Sean Hill, and Pt states that both are in agreement to try weaning Pt off all of his psychotropic medications in a safe way. Pt agreed to seek medical consultation and begin this process. Pt also indicated he wants to continue learning coping skills to manage his symptoms holistically and agreed to resume medications or enter into the hospital if Pts wife or writer have any concerns about his mood becoming concerning. Pt also agreed to look into Nuerofeedback as a new form of treatment to consider in the management of Pts Bipolar Disorder for discussion at his next session.   Suicidal/Homicidal: Nowithout intent/plan  Therapist Response: Assessed Pts overall level of functioning, used solution focused therapy to explore Pts consideration of trying to manage his Bipolar Disorder without medications and provided information on Nuerofeedback.   Plan: Return again in 2 weeks.  Diagnosis: Axis I: Bipolar Disorder    Axis II: No diagnosis    Sean Ching, LCSW 03/13/2013

## 2013-03-20 ENCOUNTER — Ambulatory Visit (HOSPITAL_COMMUNITY): Payer: Self-pay | Admitting: Psychiatry

## 2013-03-27 ENCOUNTER — Ambulatory Visit (INDEPENDENT_AMBULATORY_CARE_PROVIDER_SITE_OTHER): Payer: No Typology Code available for payment source | Admitting: Psychiatry

## 2013-03-27 DIAGNOSIS — F319 Bipolar disorder, unspecified: Secondary | ICD-10-CM

## 2013-03-27 NOTE — Progress Notes (Signed)
   THERAPIST PROGRESS NOTE  Session Time: 3:00-3:50 pm  Participation Level: Active  Behavioral Response: CasualAlertAnxious and Depressed  Type of Therapy: Individual Therapy  Treatment Goals addressed: Coping  Interventions: CBT, Solution Focused and Supportive  Summary: Sean Hill is a 72 y.o. male who presents with moderate depression and anxiety symptoms. Pt reports he decided to stop all of his medications with the exception of his Trazadone for sleep. Pt reports having depression and anxiety symptoms but presents as stable without an increase in symptoms. Pt is clear and goal directed in thought. He described feeling frustrated with dealing with stressors related to moving that have not been resolved yet and had the insight to understand that anyone would experience stress from the same situations. Pt said he is struggling with managing his worrisome thoughts and said he also has stopped doing some of his wellness activities such as mindfulness and heartmath to help manage those symptoms. Pt agreed to try resuming them throughout his routine to see if that makes a difference. Pt reported back on his homework of looking into the nuerofeedback and learned that they don't accept his insurance so that is not currently an option for him to use to manage his mental wellness. Pt explored feeling like he does not belong a "feeling different from others". Pt reported feeling less anxious and calmer at the end of his session.     Suicidal/Homicidal: Nowithout intent/plan  Therapist Response: Assessed level of depression and anxiety. Discussed discontinuation of medications and how to enact safety plan if necessary. Provided supportive therapy surrounding stress associated with moving. Educated on mindfulness as a tool to manage worrisome thoughts and to practice being more in the moment.   Plan: Return again in 1 weeks. Explore teaching more on mindfulness to reduce worrisome  thinking  Diagnosis: Axis I: Bipolar Disorder    Axis II: No diagnosis    Cassity Christian E, LCSW 03/27/2013

## 2013-04-03 ENCOUNTER — Ambulatory Visit (INDEPENDENT_AMBULATORY_CARE_PROVIDER_SITE_OTHER): Payer: No Typology Code available for payment source | Admitting: Psychiatry

## 2013-04-03 DIAGNOSIS — F319 Bipolar disorder, unspecified: Secondary | ICD-10-CM

## 2013-04-03 NOTE — Progress Notes (Signed)
   THERAPIST PROGRESS NOTE  Session Time: 3:00-4:00 pm  Participation Level: Active  Behavioral Response: Casual, Neat and Well GroomedAlertDepressed  Type of Therapy: Individual Therapy  Treatment Goals addressed: Coping  Interventions: CBT, Strength-based and Supportive  Summary: Sean Hill is a 72 y.o. male who presents with severe depressed mood and affect. Pt reports an increase in depressive symptoms since previous session with suicidal thoughts returning but no plan, intent or means. Pt described having thoughts of suicide as strengthening him to cause him to not feel "so trapped in life", but stated he would not end his life because he feels obligated to support his wife. Pt said it gives him strength and relief to be able to talk honestly about the severity of his depression and he feels better after each session. Pt said he came to terms that he needs medication to function an resumed his depression medications. He described feeling sad that he is realizing he needs medication to manage his symptoms, but knows he needs to be better to be a good husband and father. Pt reflected on his life and described regrets he had and how he is trying to make amends now with his family by being a better person. Pt said he would like to be able to attach more with others emotionally and described how growing up in an acholic home impacted his ability to connect with others. Pt is open to exploring ways to be more emotionally connected. Pt denied plans of suicide at the end of the session and contracted for safety.   Suicidal/Homicidal: Nowithout intent/plan  Therapist Response: Assessed level of depression, completed suicide risk assessment, reviewed reasons for not committing suicide, had Pt contract for safety.   Plan: Return again in 1 weeks. Update Treatment Plan  Diagnosis: Axis I: Bipolar Disorder    Axis II: No diagnosis    Kerwin Augustus E, LCSW 04/03/2013

## 2013-04-08 ENCOUNTER — Ambulatory Visit (INDEPENDENT_AMBULATORY_CARE_PROVIDER_SITE_OTHER): Payer: No Typology Code available for payment source | Admitting: Psychiatry

## 2013-04-08 ENCOUNTER — Encounter (HOSPITAL_COMMUNITY): Payer: Self-pay | Admitting: Psychiatry

## 2013-04-08 VITALS — BP 136/71 | HR 78 | Ht 69.0 in | Wt 251.0 lb

## 2013-04-08 DIAGNOSIS — F319 Bipolar disorder, unspecified: Secondary | ICD-10-CM

## 2013-04-08 MED ORDER — OLANZAPINE 5 MG PO TABS: 5.0000 mg | ORAL_TABLET | Freq: Every day | ORAL | Status: DC

## 2013-04-08 MED ORDER — FLUOXETINE HCL 10 MG PO CAPS: ORAL_CAPSULE | ORAL | Status: DC

## 2013-04-08 NOTE — Progress Notes (Signed)
[facility] [id number] Progress Note  Sean Hill [id number] 72 y.o.  04/08/2013 3:57 PM  Chief Complaint:  I stopped taking all medication.  I realized I need to restart my medication.  History of Present Illness: Patient is 72 year old Caucasian retired man who came for his followup appointment.  He was last seen in May , he did not came for his followup appointment until today.  Patient stopped taking all his psychiatric medication because he felt he does not needed.  He admitted to feeling more depressed irritable angry and having mood swing since he is no longer taking psychiatric medication.  On his last visit we started him on Risperdal however patient complaining of sedation and feeling groggy.  Patient wants to restart his medication but wants to try a different medication.  In the past he had tried multiple medication including Lamictal, Wellbutrin, Risperdal and, Neurontin.  He is no longer taking any of these medication.  He takes trazodone only at bedtime for insomnia.  He denies any hallucination but admitted irritability anger and severe mood swing.  He feeling isolated and withdrawn.  He is seeing therapist regularly.  Patient has been complaining of poor attention and concentration for a long time.  He feels psychiatric medication does not help his attention and concentration.  Since he is stop her psychotropic medication he does not see much improvement his attention and concentration.  He is willing to restart his medication however not the same medication he had tried in the past.  He denies any hallucination.  He admitted drinking more than usual.  He drinks 3-4 year every day but denies any binge drinking.  He denies any tremors or shakes.  He denies any blackouts.  Suicidal Ideation: Passive suicidal thoughts but no plan. Plan Formed: No Patient has means to carry out plan: No  Homicidal Ideation: No Plan Formed: No Patient has means to carry out plan:  No  Review of Systems  Constitutional: Positive for malaise/fatigue.  HENT: Positive for neck pain. Negative for hearing loss and tinnitus.   Gastrointestinal: Positive for heartburn.  Musculoskeletal: Positive for back pain.  Neurological: Negative.  Negative for headaches.  Psychiatric/Behavioral: Positive for depression and substance abuse. Negative for hallucinations. The patient is nervous/anxious. The patient does not have insomnia.     Psychiatric: Agitation: Yes Hallucination: No Depressed Mood: Yes Insomnia: No Hypersomnia: No Altered Concentration: No Feels Worthless: Yes Grandiose Ideas: No Belief In Special Powers: No New/Increased Substance Abuse: Yes Compulsions: No  Neurologic: Headache: No Seizure: No Paresthesias: No  Medical History:  Patient has a history of high triglycerides, irritable bowel syndrome, chronic pain, GERD.  He denies any history of seizures blackouts, traumatic brain injury or any loss of consciousness.  Legal history.  The patient has no current legal issues.   History of abuse.  Patient denies any history of physical sexual verbal and emotional abuse.   Medical history.  Patient has high triglycerides, irritable bowel syndrome, chronic pain, GERD. He is not taking medication for his high triglycerides. Patient denies any history of seizures, blackouts, traumatic brain injury or any loss of consciousness.   Family history.  Patient endorse parent's were alcoholic, younger daughter suffers from depression, nephew died due to suicide. Daughter has been admitted at least twice to [facility] and once [facility].   Education and work history.  Patient has a high school education. Currently he is retired.   Alcohol and substance use history.  Patient admitted history of  alcohol or any illegal substance use.  He admitted increased drinking and past few months.  Psychosocial history.  Patient is born and raised in [place]  narcotic. His been married once. He has 2 daughters. Patient lives with his wife. His daughter lives close by. He has good relationship with his daughter.  Outpatient Encounter Prescriptions as of 04/08/2013  Medication Sig Dispense Refill  . gabapentin (NEURONTIN) 300 MG capsule Take 1 capsule (300 mg total) by mouth 3 (three) times daily.  90 capsule  0  . omeprazole (PRILOSEC) 40 MG capsule Take 1 capsule (40 mg total) by mouth 2 (two) times daily.  1 capsule  0  . traZODone (DESYREL) 100 MG tablet Take 1 tablet (100 mg total) by mouth at bedtime as needed for sleep.  30 tablet  0  . colesevelam (WELCHOL) 625 MG tablet Take 1 tablet (625 mg total) by mouth 3 (three) times daily with meals.  90 tablet  1  . FLUoxetine (PROZAC) 10 MG capsule Take 1 capsule for 1 week and than 2 capsule daily  60 capsule  0  . OLANZapine (ZYPREXA) 5 MG tablet Take 1 tablet (5 mg total) by mouth at bedtime.  30 tablet  0  . [DISCONTINUED] lamoTRIgine (LAMICTAL) 200 MG tablet Take 1 tablet (200 mg total) by mouth daily.  30 tablet  0  . [DISCONTINUED] risperiDONE (RISPERDAL) 1 MG tablet Take 1 tablet (1 mg total) by mouth at bedtime.  30 tablet  0   No facility-administered encounter medications on file as of 04/08/2013.    Past Psychiatric History/Hospitalization(s): Patient has at least 3 psychiatric hospitalization. He has a history of taking overdose on his medication and mixing with alcohol. He has a history of cutting his wrist. He is taking his psychiatric medication for past 8 years. In the past he has seen [name] and [name]. As per chart he has taken in the past Tegretol, Depakote, Ambien, Geodon, Abilify, lithium, Provigil, Zoloft, Lamictal, Wellbutrin , Risperdal and Prstiq. Patient do not recall very well the previous response of these medication. However he felt taking all these medications together was making him very drowsy groggy and unable to function. Patient endorsed history of mania and  severe impulsive behavior. He admitted history of buying unnecessary, getting speeding tickets and aggression.  [facility].  At that time he was experiencing side effects due to polypharmacy.  He was discharged on trazodone Neurontin Wellbutrin however he stopped taking Wellbutrin on his own and cut down his Neurontin from 600 mg to 300 mg twice a day.  Anxiety: Yes Bipolar Disorder: Yes Depression: Yes Mania: Yes Psychosis: Yes Schizophrenia: No Personality Disorder: No Hospitalization for psychiatric illness: Yes History of Electroconvulsive Shock Therapy: No Prior Suicide Attempts: Yes  Physical Exam: Constitutional:  BP 136/71  Pulse 78  Ht 5\' 9"  (1.753 m)  Wt 251 lb (113.853 kg)  BMI 37.05 kg/m2  General Appearance: well nourished and obese  Musculoskeletal: Strength & Muscle Tone: within normal limits Gait & Station: normal Patient leans: N/A  Psychiatric: Speech (describe rate, volume, coherence, spontaneity, and abnormalities if any): Slow but clear and coherent.  Thought Process (describe rate, content, abstract reasoning, and computation): Logical and goal-directed.  Associations: Relevant and Intact  Thoughts: normal  Mental Status: Orientation: oriented to person, place, time/date, situation, day of week, month of year and year Mood & Affect: depressed affect, anxiety and labile affect Attention Span &  Concentration: Multimedia programmer (Choose Three): Review of Psycho-Social Stressors (1), Established Problem, Worsening (2), Review of Last Therapy Session (1), Review of Medication Regimen & Side Effects (2) and Review of New Medication or Change in Dosage (2)  Assessment: Axis I: Bipolar disorder NOS  Axis II: Deferred  Axis III:  Patient Active Problem List   Diagnosis Date Noted  . Erectile dysfunction 03/28/2012  . Microscopic hematuria 11/28/2011  . Gastroesophageal reflux  disease 11/28/2011  . Bipolar disorder 11/25/2011  . 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 Irritable bowel 05/19/2011  . Neck pain, chronic 03/08/2011    Axis IV: Moderate  Axis V: 60-65   Plan:  I have a long discussion with the patient.  I reviewed his chart.  He remembered taking Risperdal causing sedation and groggy feeling.  He remembered Seroquel causing burning sensation while urination and member Geodon did not help him.  He has never tried Zyprexa before.  At this time he is no longer taking Lamictal, Wellbutrin, Risperdal and and gabapentin.  We will try Zyprexa 5 mg at bedtime , Prozac 10 mg gradually increased to 20 mg in one week .  Recommend to take Neurontin to help his anxiety and chronic pain.  I also strongly recommend to stop his drinking .  Discussed in detail the consequences of alcohol on his psychiatric illness, interaction with psychotic medication and is in recovery.  Patient do not want Lamictal at this time.  We will discontinue Lamictal and Risperdal.  Discussed in detail safety plan that anytime having active suicidal thoughts or homicidal thoughts and he to call 911 or go to local emergency room.  I will see him again in 2-3 weeks.   Time spent 25 minutes.  More than 50% of the time spent in psychoeducation counseling and coordination of care.  Harveen Flesch T., MD 04/08/2013

## 2013-04-10 ENCOUNTER — Telehealth (HOSPITAL_COMMUNITY): Payer: Self-pay | Admitting: *Deleted

## 2013-04-10 ENCOUNTER — Ambulatory Visit (INDEPENDENT_AMBULATORY_CARE_PROVIDER_SITE_OTHER): Payer: No Typology Code available for payment source | Admitting: Psychiatry

## 2013-04-10 DIAGNOSIS — F319 Bipolar disorder, unspecified: Secondary | ICD-10-CM

## 2013-04-10 NOTE — Telephone Encounter (Signed)
Pt left VM: Saw MD on Monday. Dr. Lolly Mustache discussed two new medications for patient to try. Patient declined because he thought it would be too expensive. Now has thought about it and decided he would like to try if not too late ---- he did not fill any of the prescriptions  given to him on Monday. If he can start new medicine, please let him know and send Rx to [facility] on [address]

## 2013-04-10 NOTE — Progress Notes (Signed)
   THERAPIST PROGRESS NOTE  Session Time: 1:00-2:00 pm  Participation Level: Active  Behavioral Response: Neat and Well GroomedAlertDepressed  Type of Therapy: Individual Therapy  Treatment Goals addressed: Coping  Interventions: CBT  Summary: Sean Hill is a 72 y.o. male who presents with high depressed mood and affect. Pt presented with high negative, distorted thinking and challenged any idea that could help reduce his depression by pointing out the negative to each option. Pt struggled with being able to redirect his thoughts and talked about how he rejected all medication options presented by Dr. Lolly Mustache at his most recent medication visit. Pt was open to considering that there is still hope to finding something but struggled to understand that he could be talking himself into side affects of medications due to his high negative thought process. Pt said he would like to learn how to reduce his impulsivity and said that is getting him into difficult situations. Pt denied current thoughts of suicide and contracted for safety.   Suicidal/Homicidal: Nowithout intent/plan  Therapist Response: Assessed overall level of depression. Discussed the PGT DNA testing to consider to explore finding a class of medications that could be beneficial to Pt. Used CBT to challenge negative thinking.   Plan: Return again in 1 weeks.  Diagnosis: Axis I: bipolar Disorder    Axis II: No diagnosis    Carman Ching, LCSW 04/10/2013

## 2013-04-11 ENCOUNTER — Other Ambulatory Visit (HOSPITAL_COMMUNITY): Payer: Self-pay | Admitting: Psychiatry

## 2013-04-11 ENCOUNTER — Telehealth (HOSPITAL_COMMUNITY): Payer: Self-pay | Admitting: *Deleted

## 2013-04-11 NOTE — Telephone Encounter (Signed)
Patient called back this afternoon. States he has not heard from Dr. Lolly Mustache about the new medicine he initially refused at [facility] appt due to cost.  Patient states he has not filled the prescriptions given to him on Monday because he is waiting to talk to Dr. Lolly Mustache about possibly starting a new medicine.

## 2013-04-11 NOTE — Telephone Encounter (Signed)
Patient called and wanted to try a new medication which he initially refused on his last session.  He was given Zyprexa and Prozac.  I recommend to try Zyprexa and Prozac since they are available in generic and he has never tried before.  We will defer any new medication due to cost.  Patient does not want any brand name medication.  If he insists we will try Latuda.

## 2013-04-16 ENCOUNTER — Telehealth (HOSPITAL_COMMUNITY): Payer: Self-pay | Admitting: *Deleted

## 2013-04-16 NOTE — Telephone Encounter (Signed)
Pt called stating he has called several times in last week asking that a medication be called in to his pharmacy. States the medication needed changed due to cost. He spoke with Andrey Campanile who he states told him a message was sent to Dr. Lolly Mustache. Since he has not heard from Arfeen he states he no longer wants to be his patient. States he will find a new psychiatrist as he "does not seem to care." Explained message would be given to Dr. Lolly Mustache.

## 2013-04-17 ENCOUNTER — Ambulatory Visit (INDEPENDENT_AMBULATORY_CARE_PROVIDER_SITE_OTHER): Payer: No Typology Code available for payment source | Admitting: Psychiatry

## 2013-04-17 ENCOUNTER — Telehealth (HOSPITAL_COMMUNITY): Payer: Self-pay | Admitting: Psychiatry

## 2013-04-17 DIAGNOSIS — F319 Bipolar disorder, unspecified: Secondary | ICD-10-CM

## 2013-04-17 NOTE — Progress Notes (Signed)
   THERAPIST PROGRESS NOTE  Session Time: 3:00-4:00 pm  Participation Level: Active  Behavioral Response: Casual and NeatAlertDepressed  Type of Therapy: Individual Therapy  Treatment Goals addressed: Coping  Interventions: Solution Focused and Supportive  Summary: Sean Hill is a 73 y.o. male who presents with severe depressed mood, affect and negative thought process. Pt described his depression symptoms, lack of sleep, feelings of worthlessness and lack of enjoyment and interest. Pt said his depression is high but denied plans to attempt suicide. Pt described a desire to want to get well and said he is open to trying a medication recommended by Dr. Lolly Mustache, [name]. Pt has decided to return to see his previous PA, [name], after feeling disappointed in not getting return phone calls from [facility] in over a week, which was confirmed by Clinical research associate. Pt was able to challenge his negative thought process and understood that it was contributing to his frustration and depression symptoms. Pt was open minded to a discussion about his impulsivity sabotaging his wellness when looking at his history of being non compliant with medications and wanting to make changes when he is feeling good. Pt was open to the idea of asking about injectable medications to avoid Pts ability to stop taking his medications and take away his ability to make changes during moments of impulsivity. Pt agrees to discuss this with [name] tomorrow at his appointment.   Suicidal/Homicidal: Nowithout intent/plan  Therapist Response: Assessed Pts overall level of depression, assessed for safety, reviewed safety contract, explored impulsivity as pts way of sabotaging wellness and discussed ways to communicate this to his PA at his visist tomorrow.   Plan: Return again in 2 weeks. Pt said he was unable to get an appointment next week, but agreed to call if he needed anything.   Diagnosis: Axis I: bipolar Disorder    Axis  II: No diagnosis    Esma Kilts E, LCSW 04/17/2013

## 2013-04-17 NOTE — Telephone Encounter (Signed)
Call return.  Patient wants to refer himself some where else.  He is not happy because no one call him back earlier.  Patient was explained that provider was on vacation and he requested a new medication to start which cannot be done by covering physician.  Patient like to be followup somewhere else.  We will close his case and provide referrals.

## 2013-04-23 ENCOUNTER — Other Ambulatory Visit: Payer: Self-pay

## 2013-04-23 ENCOUNTER — Encounter (HOSPITAL_COMMUNITY): Payer: Self-pay | Admitting: Emergency Medicine

## 2013-04-23 ENCOUNTER — Inpatient Hospital Stay (HOSPITAL_COMMUNITY)
Admission: EM | Admit: 2013-04-23 | Discharge: 2013-04-30 | DRG: 917 | Disposition: A | Discharge status: Other Acute Inpatient Hospital | Payer: Medicare Other | Attending: Internal Medicine | Admitting: Internal Medicine

## 2013-04-23 DIAGNOSIS — J45909 Unspecified asthma, uncomplicated: Secondary | ICD-10-CM | POA: Diagnosis present

## 2013-04-23 DIAGNOSIS — Z96659 Presence of unspecified artificial knee joint: Secondary | ICD-10-CM

## 2013-04-23 DIAGNOSIS — J9691 Respiratory failure, unspecified with hypoxia: Secondary | ICD-10-CM

## 2013-04-23 DIAGNOSIS — T50901A Poisoning by unspecified drugs, medicaments and biological substances, accidental (unintentional), initial encounter: Secondary | ICD-10-CM

## 2013-04-23 DIAGNOSIS — T50902A Poisoning by unspecified drugs, medicaments and biological substances, intentional self-harm, initial encounter: Secondary | ICD-10-CM | POA: Diagnosis present

## 2013-04-23 DIAGNOSIS — Z880 Allergy status to penicillin: Secondary | ICD-10-CM

## 2013-04-23 DIAGNOSIS — G4700 Insomnia, unspecified: Secondary | ICD-10-CM | POA: Diagnosis present

## 2013-04-23 DIAGNOSIS — T424X4A Poisoning by benzodiazepines, undetermined, initial encounter: Principal | ICD-10-CM | POA: Diagnosis present

## 2013-04-23 DIAGNOSIS — E785 Hyperlipidemia, unspecified: Secondary | ICD-10-CM | POA: Diagnosis present

## 2013-04-23 DIAGNOSIS — G92 Toxic encephalopathy: Secondary | ICD-10-CM | POA: Diagnosis present

## 2013-04-23 DIAGNOSIS — J9600 Acute respiratory failure, unspecified whether with hypoxia or hypercapnia: Secondary | ICD-10-CM

## 2013-04-23 DIAGNOSIS — K219 Gastro-esophageal reflux disease without esophagitis: Secondary | ICD-10-CM | POA: Diagnosis present

## 2013-04-23 DIAGNOSIS — R4182 Altered mental status, unspecified: Secondary | ICD-10-CM

## 2013-04-23 DIAGNOSIS — G929 Unspecified toxic encephalopathy: Secondary | ICD-10-CM | POA: Diagnosis present

## 2013-04-23 DIAGNOSIS — F3130 Bipolar disorder, current episode depressed, mild or moderate severity, unspecified: Secondary | ICD-10-CM | POA: Diagnosis present

## 2013-04-23 DIAGNOSIS — G9340 Encephalopathy, unspecified: Secondary | ICD-10-CM | POA: Diagnosis present

## 2013-04-23 DIAGNOSIS — F319 Bipolar disorder, unspecified: Secondary | ICD-10-CM

## 2013-04-23 LAB — COMPREHENSIVE METABOLIC PANEL
AST: 31 U/L (ref 0–37)
AST: 23 U/L (ref 0–37)
Alkaline Phosphatase: 85 U/L (ref 39–117)
BUN: 12 mg/dL (ref 6–23)
BUN: 11 mg/dL (ref 6–23)
CO2: 24 mEq/L (ref 19–32)
CO2: 24 mEq/L (ref 19–32)
Calcium: 8.8 mg/dL (ref 8.4–10.5)
Chloride: 102 mEq/L (ref 96–112)
Chloride: 102 mEq/L (ref 96–112)
Creatinine, Ser: 0.86 mg/dL (ref 0.50–1.35)
Creatinine, Ser: 0.85 mg/dL (ref 0.50–1.35)
GFR calc Af Amer: >90 mL/min (ref >90)
GFR calc non Af Amer: 86 mL/min — ABNORMAL LOW (ref >90)
GFR calc non Af Amer: 86 mL/min — ABNORMAL LOW (ref >90)
Glucose, Bld: 111 mg/dL — ABNORMAL HIGH (ref 70–99)
Potassium: 4.2 mEq/L (ref 3.5–5.1)
Total Bilirubin: 0.4 mg/dL (ref 0.3–1.2)
Total Bilirubin: 0.4 mg/dL (ref 0.3–1.2)

## 2013-04-23 LAB — CBC WITH DIFFERENTIAL/PLATELET
Basophils Absolute: 0.1 10*3/uL (ref 0.0–0.1)
Basophils Absolute: 0 10*3/uL (ref 0.0–0.1)
Basophils Relative: 0 % (ref 0–1)
Eosinophils Relative: 1 % (ref 0–5)
HCT: 44.8 % (ref 39.0–52.0)
HCT: 43 % (ref 39.0–52.0)
Hemoglobin: 15.6 g/dL (ref 13.0–17.0)
Lymphocytes Relative: 33 % (ref 12–46)
Lymphocytes Relative: 19 % (ref 12–46)
MCHC: 34.2 g/dL (ref 30.0–36.0)
MCV: 81.7 fL (ref 78.0–100.0)
Monocytes Absolute: 0.5 10*3/uL (ref 0.1–1.0)
Monocytes Absolute: 0.5 10*3/uL (ref 0.1–1.0)
Monocytes Relative: 10 % (ref 3–12)
Neutro Abs: 3 10*3/uL (ref 1.7–7.7)
Neutro Abs: 4.8 10*3/uL (ref 1.7–7.7)
Platelets: 199 10*3/uL (ref 150–400)
RDW: 14.6 % (ref 11.5–15.5)
WBC: 5.5 10*3/uL (ref 4.0–10.5)
WBC: 6.7 10*3/uL (ref 4.0–10.5)

## 2013-04-23 LAB — ACETAMINOPHEN LEVEL: Acetaminophen (Tylenol), Serum: <15 ug/mL (ref 10–30)

## 2013-04-23 LAB — POCT I-STAT, CHEM 8
Creatinine, Ser: 0.9 mg/dL (ref 0.50–1.35)
HCT: 47 % (ref 39.0–52.0)
Hemoglobin: 16 g/dL (ref 13.0–17.0)
Potassium: 5.4 mEq/L — ABNORMAL HIGH (ref 3.5–5.1)
Sodium: 136 mEq/L (ref 135–145)
TCO2: 25 mmol/L (ref 0–100)

## 2013-04-23 LAB — SALICYLATE LEVEL: Salicylate Lvl: <2 mg/dL — ABNORMAL LOW (ref 2.8–20.0)

## 2013-04-23 LAB — URINALYSIS, ROUTINE W REFLEX MICROSCOPIC
Glucose, UA: NEGATIVE mg/dL
Ketones, ur: NEGATIVE mg/dL
Leukocytes, UA: NEGATIVE
Nitrite: NEGATIVE
Protein, ur: NEGATIVE mg/dL
Urobilinogen, UA: 0.2 mg/dL (ref 0.0–1.0)

## 2013-04-23 LAB — MRSA PCR SCREENING: MRSA by PCR: POSITIVE — AB

## 2013-04-23 LAB — APTT: aPTT: 31 seconds (ref 24–37)

## 2013-04-23 LAB — RAPID URINE DRUG SCREEN, HOSP PERFORMED
Barbiturates: NOT DETECTED
Cocaine: NOT DETECTED

## 2013-04-23 LAB — PHOSPHORUS: Phosphorus: 3.9 mg/dL (ref 2.3–4.6)

## 2013-04-23 LAB — ETHANOL: Alcohol, Ethyl (B): <11 mg/dL (ref 0–11)

## 2013-04-23 MED ORDER — LURASIDONE HCL 40 MG PO TABS
40.0000 mg | ORAL_TABLET | Freq: Every evening | ORAL | Status: DC | PRN
  Filled 2013-04-23 (×10): qty 1

## 2013-04-23 MED ORDER — ALBUTEROL SULFATE (5 MG/ML) 0.5% IN NEBU: 2.5000 mg | INHALATION_SOLUTION | RESPIRATORY_TRACT | Status: DC | PRN

## 2013-04-23 MED ORDER — SODIUM CHLORIDE 0.9 % IJ SOLN
3.0000 mL | Freq: Two times a day (BID) | INTRAMUSCULAR | Status: DC
  Administered 2013-04-24 – 2013-04-28 (×6): 3 mL via INTRAVENOUS

## 2013-04-23 MED ORDER — TRAZODONE HCL 100 MG PO TABS
100.0000 mg | ORAL_TABLET | Freq: Every evening | ORAL | Status: DC | PRN
  Filled 2013-04-23: qty 1

## 2013-04-23 MED ORDER — LORAZEPAM 2 MG/ML IJ SOLN
1.0000 mg | Freq: Four times a day (QID) | INTRAMUSCULAR | Status: DC | PRN
  Administered 2013-04-24: 1 mg via INTRAVENOUS
  Filled 2013-04-23 (×2): qty 1

## 2013-04-23 MED ORDER — OXYMETAZOLINE HCL 0.05 % NA SOLN
2.0000 | Freq: Two times a day (BID) | NASAL | Status: DC
  Administered 2013-04-24 – 2013-04-26 (×3): 2 via NASAL
  Filled 2013-04-23: qty 15

## 2013-04-23 MED ORDER — PANTOPRAZOLE SODIUM 40 MG PO TBEC
40.0000 mg | DELAYED_RELEASE_TABLET | Freq: Every day | ORAL | Status: DC
  Filled 2013-04-23: qty 1

## 2013-04-23 MED ORDER — SODIUM CHLORIDE 0.9 % IV SOLN: INTRAVENOUS | Status: AC

## 2013-04-23 MED ORDER — ONDANSETRON HCL 4 MG/2ML IJ SOLN
4.0000 mg | Freq: Four times a day (QID) | INTRAMUSCULAR | Status: DC | PRN
  Administered 2013-04-27: 4 mg via INTRAVENOUS
  Filled 2013-04-23: qty 2

## 2013-04-23 MED ORDER — ONDANSETRON HCL 4 MG PO TABS: 4.0000 mg | ORAL_TABLET | Freq: Four times a day (QID) | ORAL | Status: DC | PRN

## 2013-04-23 MED ORDER — NALOXONE HCL 1 MG/ML IJ SOLN
0.8000 mg | Freq: Once | INTRAMUSCULAR | Status: AC
  Administered 2013-04-23: 0.8 mg via INTRAVENOUS

## 2013-04-23 MED ORDER — ACETAMINOPHEN 650 MG RE SUPP: 650.0000 mg | Freq: Four times a day (QID) | RECTAL | Status: DC | PRN

## 2013-04-23 MED ORDER — ACETAMINOPHEN 325 MG PO TABS
650.0000 mg | ORAL_TABLET | Freq: Four times a day (QID) | ORAL | Status: DC | PRN
  Administered 2013-04-27 – 2013-04-29 (×3): 650 mg via ORAL
  Filled 2013-04-23 (×3): qty 2

## 2013-04-23 MED ORDER — SODIUM CHLORIDE 0.9 % IV SOLN
INTRAVENOUS | Status: DC
  Administered 2013-04-23: 21:00:00 via INTRAVENOUS
  Administered 2013-04-24: 125 mL/h via INTRAVENOUS
  Administered 2013-04-24: 11:00:00 via INTRAVENOUS
  Administered 2013-04-24 – 2013-04-25 (×2): 125 mL/h via INTRAVENOUS

## 2013-04-23 MED ORDER — SODIUM CHLORIDE 0.9 % IV SOLN
1000.0000 mL | Freq: Once | INTRAVENOUS | Status: AC
  Administered 2013-04-23: 1000 mL via INTRAVENOUS

## 2013-04-23 MED ORDER — SODIUM CHLORIDE 0.9 % IV SOLN
1000.0000 mL | INTRAVENOUS | Status: DC
  Administered 2013-04-23: 1000 mL via INTRAVENOUS

## 2013-04-23 NOTE — ED Provider Notes (Signed)
CSN: [id number]     Arrival date & time 04/23/13  1801 History     First MD Initiated Contact with Patient 04/23/13 1801     Chief Complaint  Patient presents with  . Ingestion   (Consider location/radiation/quality/duration/timing/severity/associated sxs/prior Treatment) The history is provided by the patient, the EMS personnel and the spouse. The history is limited by the condition of the patient.  Sean Hill is a 72 y.o. male hx of bipolar, HL here with drug ingestion. As per wife he is been more depressed recently. She was trying to bring him to behavioral health today around 4:30 and told him to get changed. He then overdosed on Klonopin and Prilosec. He claimed that he took 100 pills of Klonopin. However there are only 90 pills in the bottle and 32 pills were left on arrival to the ED. His Prilosec the bottle at 60 pills in it and only one was left. Patient brought by EMS and had nl CBG en route. No narcan was given en route.    Level V caveat- AMS   Past Medical History  Diagnosis Date  . Bipolar disorder     Has psychiatrist.  BH admission (suicidal) 07/20/11.  Marland Kitchen Hyperlipemia     Intol of meds: GI side effects  . 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  No hx of MRSA infection.  . 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 Nephrolithiasis   . GERD (gastroesophageal reflux disease)   . Inguinal hernia 02/25/11    Left sided hernia with fat  . Hip pain, left 2011-2012    Left hip and groin: MRI pelvis and left hip 02/18/11 showed NORMAL hip, with small inguinal hernia containing fat and sigmoid diverticulosis.  Hip pain presumably referred pain from hernia/diverticular dz??.  . Cervical spondylosis     Primarily C4-5 and C5-6--referred to [facility] 02/2011.  . Depression   . Bipolar disorder, current episode depressed, moderate   . Sleep apnea   . Asthma    Past Surgical History  Procedure  Laterality Date  . 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 Ankle fracture surgery      hardware still in both ankles (titanium)  . Inguinal hernia repair      Right side with mesh about 1993  . Elbow surgery      left---tendon  . Ankle surgery      Ankle tendon surgery  . Tonsillectomy    . Appendectomy     Family History  Problem Relation Age of Onset  . Alcohol abuse Mother   . Alcohol abuse Father   . Depression Daughter   . Paranoid behavior Daughter    History  Substance Use Topics  . Smoking status: Never Smoker   . Smokeless tobacco: Not on file  . Alcohol Use: No     Comment: One drink rarely    Review of Systems  Unable to perform ROS: Mental status change    Allergies  Codeine and Penicillins  Home Medications   Current Outpatient Rx  Name  Route  Sig  Dispense  Refill  . clonazePAM (KLONOPIN) 1 MG tablet   Oral   Take 1 mg by mouth 3 (three) times daily as needed for anxiety.         Marland Kitchen lurasidone (LATUDA) 40 MG TABS tablet   Oral   Take 40  mg by mouth at bedtime and may repeat dose one time if needed.         Marland Kitchen omeprazole (PRILOSEC) 40 MG capsule   Oral   Take 1 capsule (40 mg total) by mouth 2 (two) times daily.   1 capsule   0   . oxymetazoline (AFRIN) 0.05 % nasal spray   Nasal   Place 2 sprays into the nose 2 (two) times daily.         . traZODone (DESYREL) 100 MG tablet   Oral   Take 1 tablet (100 mg total) by mouth at bedtime as needed for sleep.   30 tablet   0    BP 116/66  Pulse 79  Resp 23  SpO2 93% Physical Exam  Nursing note and vitals reviewed. Constitutional:  Tired, difficult to arouse, moans to painful stimuli.   HENT:  Head: Normocephalic.  Mouth/Throat: Oropharynx is clear and moist.  Eyes:  Pupils small but reactive   Neck: Normal range of motion. Neck supple.  Cardiovascular: Normal rate, regular rhythm and normal heart sounds.   Pulmonary/Chest: Effort normal and breath sounds  normal. No respiratory distress. He has no wheezes. He has no rales.  Abdominal: Soft. Bowel sounds are normal. He exhibits no distension. There is no tenderness. There is no rebound and no guarding.  Obese   Musculoskeletal: Normal range of motion.  Neurological:  Tired, difficult to arouse, protecting his airway currently   Skin: Skin is warm.  Psychiatric:  Unable     ED Course   Procedures (including critical care time)  CRITICAL CARE Performed by: Silverio Lay, Yvonne Petite   Total critical care time: 30 min   Critical care time was exclusive of separately billable procedures and treating other patients.  Critical care was necessary to treat or prevent imminent or life-threatening deterioration.  Critical care was time spent personally by me on the following activities: development of treatment plan with patient and/or surrogate as well as nursing, discussions with consultants, evaluation of patient's response to treatment, examination of patient, obtaining history from patient or surrogate, ordering and performing treatments and interventions, ordering and review of laboratory studies, ordering and review of radiographic studies, pulse oximetry and re-evaluation of patient's condition.   Labs Reviewed  COMPREHENSIVE METABOLIC PANEL - Abnormal; Notable for the following:    Albumin 3.4 (*)    GFR calc non Af Amer 86 (*)    All other components within normal limits  SALICYLATE LEVEL - Abnormal; Notable for the following:    Salicylate Lvl <2.0 (*)    All other components within normal limits  POCT I-STAT, CHEM 8 - Abnormal; Notable for the following:    Potassium 5.4 (*)    Calcium, Ion 1.11 (*)    All other components within normal limits  ACETAMINOPHEN LEVEL  CBC WITH DIFFERENTIAL  URINE RAPID DRUG SCREEN (HOSP PERFORMED)  ETHANOL  URINALYSIS, ROUTINE W REFLEX MICROSCOPIC   No results found. No diagnosis found.   Date: 04/23/2013  Rate: 79  Rhythm: normal sinus rhythm  QRS  Axis: normal  Intervals: normal  ST/T Wave abnormalities: nonspecific ST changes  Conduction Disutrbances:none  Narrative Interpretation:   Old EKG Reviewed: unchanged    MDM  Sean Hill is a 72 y.o. male here with drug overdose now tired and difficult to arouse. I called poison control, who recommend supportive treatment and monitor for respiratory depression. Told me to hold off on giving flumazenil. Will check tox and labs and will  need medical admission.   8:18 PM Tox neg. Patient sleepy but protecting airway. Poison control recommend 4 hr tylenol levels, monitor respiratory status. Will admit to step down under Dr. Elisabeth Pigeon.    Richardean Canal, MD 04/23/13 2019

## 2013-04-23 NOTE — ED Notes (Signed)
Cordelia Pen- wife's phone number: [phone]

## 2013-04-23 NOTE — H&P (Signed)
[facility] History and Physical  Sean Hill JOA:[id number] DOB: May 18, 1941 DOA: 04/23/2013  Referring physician: ER physician PCP: Thomos Lemons, DO   Chief Complaint: drug overdose  HPI:  72 year old male with past medical history of bipolar disorder, depression, GERD who was brought to [facility] ED due to altered mental status. Per family (not present at this time at the bedside) patient has taken Klonopin and Prilosec, multiple tablets and was lethargic and unresponsive. History is not provided by the  patient due to altered mental status. In ED patient was given narcan and his mental status somewhat improved. Vital signs were stable in ED, BP 116/66, HR 79 and RR 19-23. O2 saturation was 90% on room air. BMP and CBC were essentially unremarkable. Poison control was contacted by ED physician and recommendation was to observe for CNS and respiratory depression and obtain EKG. The 12 lead EKG showed normal sinus rhythm.   Assessment and Plan:  Principal Problem:   *Respiratory failure with hypoxia - secondary to overdose on Klonopin - pt given narcan in ED; pt still altered but protecting airways - has required non re-breather mask but now on 4 L nasal canula and O2 saturation of 94%   *Drug overdose - on Klonopin - poison control contacted by an ED physician and recommendation is to monitor for CNS and respiratory depression. Recommended to obtain EKG in am, use IV fluids and oxygen support to keep O2 saturation above 90% - ativan 1 mg Q 6 hours as needed for withdrawals   *Acute drug induced encephalopathy - secondary to drug overdose - management as above  Active Problems:   Bipolar disorder - please consult psych in am   Gastroesophageal reflux disease - continue PPI therapy  Manson Passey [facility] [phone]  Review of Systems:  Unable to obtain due to altered mental status.  Past Medical History  Diagnosis Date  . Bipolar disorder     Has psychiatrist.  BH admission  (suicidal) 07/20/11.  Marland Kitchen Hyperlipemia     Intol of meds: GI side effects  . 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  No hx of MRSA infection.  . 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 Nephrolithiasis   . GERD (gastroesophageal reflux disease)   . Inguinal hernia 02/25/11    Left sided hernia with fat  . Hip pain, left 2011-2012    Left hip and groin: MRI pelvis and left hip 02/18/11 showed NORMAL hip, with small inguinal hernia containing fat and sigmoid diverticulosis.  Hip pain presumably referred pain from hernia/diverticular dz??.  . Cervical spondylosis     Primarily C4-5 and C5-6--referred to [facility] 02/2011.  . Depression   . Bipolar disorder, current episode depressed, moderate   . Sleep apnea   . Asthma    Past Surgical History  Procedure Laterality Date  . 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 Ankle fracture surgery      hardware still in both ankles (titanium)  . Inguinal hernia repair      Right side with mesh about 1993  . Elbow surgery      left---tendon  . Ankle surgery      Ankle tendon surgery  . Tonsillectomy    . Appendectomy     Social History:  reports that he has never smoked. He does not have any smokeless tobacco history on file. He  reports that he does not drink alcohol or use illicit drugs.  Allergies  Allergen Reactions  . Codeine   . Penicillins Hives and Other (See Comments)    Watery blisters     Family History  Problem Relation Age of Onset  . Alcohol abuse Mother   . Alcohol abuse Father   . Depression Daughter   . Paranoid behavior Daughter     Prior to Admission medications   Medication Sig Start Date End Date Taking? Authorizing Provider  clonazePAM (KLONOPIN) 1 MG tablet Take 1 mg by mouth 3 (three) times daily as needed for anxiety.   Yes Historical Provider, MD  lurasidone (LATUDA) 40 MG TABS tablet Take 40 mg by mouth at bedtime  and may repeat dose one time if needed.   Yes Historical Provider, MD  omeprazole (PRILOSEC) 40 MG capsule Take 1 capsule (40 mg total) by mouth 2 (two) times daily. 12/11/12  Yes Nanine Means, NP  oxymetazoline (AFRIN) 0.05 % nasal spray Place 2 sprays into the nose 2 (two) times daily.   Yes Historical Provider, MD  traZODone (DESYREL) 100 MG tablet Take 1 tablet (100 mg total) by mouth at bedtime as needed for sleep. 12/10/12  Yes Patrick North, MD   Physical Exam: Filed Vitals:   04/23/13 1756 04/23/13 1800 04/23/13 1845 04/23/13 2000  BP:  129/72 134/76 116/66  Pulse:  80 79 79  TempSrc:  Oral    Resp:  19  23  SpO2: 100% 99% 99% 93%    Physical Exam  Constitutional: Appears well-developed, no acute distress, lethargic HENT: Normocephalic. Dry mucus membranes Eyes: Conjunctivae and EOM are normal. PERRLA, no scleral icterus.  Neck: Normal ROM. Neck supple. No JVD. No tracheal deviation..  CVS: RRR, S1/S2 appreciated Pulmonary: Effort and breath sounds normal, no stridor, rhonchi, wheezes, rales.  Abdominal: Soft. BS +,  no distension, tenderness, rebound or guarding.  Musculoskeletal: Normal range of motion. No edema and no tenderness.  Lymphadenopathy: No lymphadenopathy noted, cervical, inguinal. Neuro: drowsy, no focal neurologic deficits Skin: Skin is warm and dry.  Psychiatric: unable to assess due to altered mental status   Labs on Admission:  Basic Metabolic Panel:  Recent Labs Lab 04/23/13 1830 04/23/13 1846  NA 135 136  K 4.2 5.4*  CL 102 106  CO2 24  --   GLUCOSE 95 95  BUN 12 16  CREATININE 0.86 0.90  CALCIUM 9.1  --    Liver Function Tests:  Recent Labs Lab 04/23/13 1830  AST 31  ALT 22  ALKPHOS 85  BILITOT 0.4  PROT 6.4  ALBUMIN 3.4*   No results found for this basename: LIPASE, AMYLASE,  in the last 168 hours No results found for this basename: AMMONIA,  in the last 168 hours CBC:  Recent Labs Lab 04/23/13 1830 04/23/13 1846  WBC 5.5   --   NEUTROABS 3.0  --   HGB 15.6 16.0  HCT 44.8 47.0  MCV 81.3  --   PLT 213  --    Cardiac Enzymes: No results found for this basename: CKTOTAL, CKMB, CKMBINDEX, TROPONINI,  in the last 168 hours BNP: No components found with this basename: POCBNP,  CBG: No results found for this basename: GLUCAP,  in the last 168 hours  Radiological Exams on Admission: No results found.  EKG: Normal sinus rhythm, no ST/T wave changes  Code Status: Full Family Communication: Pt at bedside Disposition Plan: Admit for further evaluation; stepdown unit  Manson Passey, MD  [facility] Pager [phone]  If 7PM-7AM, please contact night-coverage [email or web address] Password [facility] 04/23/2013, 8:32 PM

## 2013-04-23 NOTE — ED Notes (Signed)
Per EMS pt's wife got home and found pt on couch where pt states that he took approx 100 klonopin and unknown amount of Prilosec. Pt allergic to codeine.

## 2013-04-23 NOTE — ED Notes (Addendum)
Pt taken off the non-rebreather.  Nasal airway noted.  Pt's O2 sat went down to 90% on RA.  Pt put on  4L and O2 sat is 94%.

## 2013-04-23 NOTE — ED Notes (Signed)
1 pair white tennis shoes and 1 pair dark socks placed in belongings bag at bedside-pt also has 1 pair wire-rim glasses at bedside and has 2 silver colored hoops in his left ear which have not been removed.

## 2013-04-23 NOTE — ED Notes (Signed)
ROOM [id number] ASSIGNED@2029 

## 2013-04-23 NOTE — Progress Notes (Signed)
Patient is non responsive and unable to sign up for My Chart. Briscoe Burns BSN, RN-BC Admissions RN  04/23/2013 8:01 PM

## 2013-04-23 NOTE — ED Notes (Signed)
[id number]<JY> Expected date:<BR> Expected time:<BR> Means of arrival:<BR> Comments:<BR> EMS-OD

## 2013-04-23 NOTE — ED Notes (Addendum)
Per Poison Control, they recommended to monitor for CNS depression and respiratory depression. They advised that if flumazenil is used it should be done cautiously due to high risk of seizures. They advised to get an EKG, use IV fluids, and use respiratory support as required.

## 2013-04-24 ENCOUNTER — Ambulatory Visit (HOSPITAL_COMMUNITY): Payer: Medicare Other

## 2013-04-24 DIAGNOSIS — T438X2A Poisoning by other psychotropic drugs, intentional self-harm, initial encounter: Secondary | ICD-10-CM

## 2013-04-24 DIAGNOSIS — F3160 Bipolar disorder, current episode mixed, unspecified: Secondary | ICD-10-CM

## 2013-04-24 DIAGNOSIS — G9340 Encephalopathy, unspecified: Secondary | ICD-10-CM

## 2013-04-24 DIAGNOSIS — F1310 Sedative, hypnotic or anxiolytic abuse, uncomplicated: Secondary | ICD-10-CM

## 2013-04-24 DIAGNOSIS — T424X4A Poisoning by benzodiazepines, undetermined, initial encounter: Principal | ICD-10-CM

## 2013-04-24 DIAGNOSIS — T43502A Poisoning by unspecified antipsychotics and neuroleptics, intentional self-harm, initial encounter: Secondary | ICD-10-CM

## 2013-04-24 LAB — COMPREHENSIVE METABOLIC PANEL
CO2: 25 mEq/L (ref 19–32)
Calcium: 9 mg/dL (ref 8.4–10.5)
Creatinine, Ser: 0.83 mg/dL (ref 0.50–1.35)
GFR calc Af Amer: >90 mL/min (ref >90)
GFR calc non Af Amer: 87 mL/min — ABNORMAL LOW (ref >90)
Glucose, Bld: 119 mg/dL — ABNORMAL HIGH (ref 70–99)
Total Protein: 6.1 g/dL (ref 6.0–8.3)

## 2013-04-24 LAB — CBC
HCT: 42.6 % (ref 39.0–52.0)
Hemoglobin: 14.8 g/dL (ref 13.0–17.0)
RBC: 5.23 MIL/uL (ref 4.22–5.81)
WBC: 8.9 10*3/uL (ref 4.0–10.5)

## 2013-04-24 LAB — TSH: TSH: 0.777 u[IU]/mL (ref 0.350–4.500)

## 2013-04-24 LAB — GLUCOSE, CAPILLARY: Glucose-Capillary: 101 mg/dL — ABNORMAL HIGH (ref 70–99)

## 2013-04-24 MED ORDER — HALOPERIDOL LACTATE 5 MG/ML IJ SOLN
2.0000 mg | Freq: Once | INTRAMUSCULAR | Status: AC
  Administered 2013-04-24: 2 mg via INTRAVENOUS
  Filled 2013-04-24: qty 1

## 2013-04-24 MED ORDER — CHLORHEXIDINE GLUCONATE CLOTH 2 % EX PADS
6.0000 | MEDICATED_PAD | Freq: Every day | CUTANEOUS | Status: AC
  Administered 2013-04-24 – 2013-04-27 (×4): 6 via TOPICAL

## 2013-04-24 MED ORDER — MUPIROCIN 2 % EX OINT
1.0000 "application " | TOPICAL_OINTMENT | Freq: Two times a day (BID) | CUTANEOUS | Status: AC
  Administered 2013-04-24 – 2013-04-28 (×9): 1 via NASAL
  Filled 2013-04-24: qty 22

## 2013-04-24 MED ORDER — HALOPERIDOL LACTATE 5 MG/ML IJ SOLN
1.0000 mg | Freq: Four times a day (QID) | INTRAMUSCULAR | Status: DC | PRN
  Administered 2013-04-24 (×2): 1 mg via INTRAVENOUS
  Filled 2013-04-24 (×2): qty 1

## 2013-04-24 NOTE — Progress Notes (Signed)
Dr. David Stall notified regarding patient fluctuating behavior, between restlessness and lethargy, and patient pulling at lines and not following commands. Confirmed Ativan was appropriate to administer.   New orders also given.  Notified MD of wife's concerns about recent back pain. Will continue to monitor.  Kinnie Feil, RN

## 2013-04-24 NOTE — Progress Notes (Signed)
  [id number] Earlene Plater RN, BSN, CCN: [phone] Case management. Chart reviewed for discharge planning and present needs. Discharge needs: none present at time of review. Next chart review due:  [id number]

## 2013-04-24 NOTE — Progress Notes (Signed)
Patient restless after haldol and ativan administration.  Dr. David Stall notified.  Unable to complete MRI at this time due to restlessness.  New orders given.  Will continue to monitor.  Kinnie Feil, RN

## 2013-04-24 NOTE — Clinical Social Work Psychosocial (Signed)
Clinical Social Work Department BRIEF PSYCHOSOCIAL ASSESSMENT 04/24/2013  Patient:  Sean Hill, Sean Hill     Account Number:  [id number]     Admit date:  04/23/2013  Clinical Social Worker:  Jodelle Red  Date/Time:  04/24/2013 10:46 AM  Referred by:  Physician  Date Referred:  04/23/2013 Referred for  Behavioral Health Issues   Other Referral:   Interview type:  Family Other interview type:   wife-[name]    PSYCHOSOCIAL DATA Living Status:  WIFE Admitted from facility:   Level of care:   Primary support name:  Arboriculturist Primary support relationship to patient:  SPOUSE Degree of support available:   has two daughters as well    CURRENT CONCERNS Current Concerns  Behavioral Health Issues   Other Concerns:    SOCIAL WORK ASSESSMENT / PLAN CSW met with wife to offer support and check in. Pt not oriented currently. Per wife, this was an intentional OD and Pt has long h/o suicide attempts. She shared that Pt has a Veterinary surgeon and psychiatrist and that recent med changes were made. Per wife, Pt was very agitated recently. CSW explained that psych CSW will folllow along with psych to help determine plan for Pt. Wife reports to have excellent support to assist her currently.   Assessment/plan status:  Psychosocial Support/Ongoing Assessment of Needs Other assessment/ plan:   Psych CSW to follow and further assess   Information/referral to community resources:    PATIENT'S/FAMILY'S RESPONSE TO PLAN OF CARE: Pt not able to participate currently. Wife has good supports to assist her and agrees to CSW following. CSW to follow and assist with plan.    Doreen Salvage, LCSW ICU/Stepdown Clinical Social Worker [facility] Cell [phone] Hours 8am-1200pm M-F

## 2013-04-24 NOTE — Progress Notes (Signed)
[facility] PROGRESS NOTE Interim History: 72 year old male with past medical history of bipolar disorder, depression, GERD who was brought to [facility] ED due to altered mental status. Per family (not present at this time at the bedside) patient has taken Klonopin and Prilosec, multiple tablets and was lethargic and unresponsive. History is not provided by the patient due to altered mental status. It was a 90 bottle pill  and 32 pills were left on arrival to the ED.     Assessment/Plan: Respiratory failure with hypoxia - most likely due to BZD. - able to protect airway. - Sat > 90% on 2 L of O2.  Intentional Drug overdose: - EKG, cont to monitor sats, cont O2 support. - No events on telemetry. - Withdrawing form Noci stimuli. - No BZD's on urine (klonipin should be pick up by UDS). CT head rule out bleed,  Concern about a posterior fossa stroke, might need an MRI.   Acute encephalopathy - due to encephalopathy.   Gastroesophageal reflux disease  Bipolar disorder - psyq in am.    Code Status: full Family Communication: wife  Disposition Plan: inapteint   Consultants:  psy  Procedures:  CT head  Antibiotics:  None  HPI/Subjective: Pt lethargic, mumbling to voice and touch. Not responsive to verbal stimuli.  Objective: Filed Vitals:   04/24/13 0000 04/24/13 0028 04/24/13 0200 04/24/13 0400  BP: 135/68  154/73   Pulse: 72  88   Temp:  97.5 F (36.4 C)  97.1 F (36.2 C)  TempSrc:  Oral  Oral  Resp: 19  22   Height:      Weight:      SpO2: 98%  95%     Intake/Output Summary (Last 24 hours) at 04/24/13 0731 Last data filed at 04/24/13 0500  Gross per 24 hour  Intake    875 ml  Output   1100 ml  Net   -225 ml   Filed Weights   04/23/13 2115  Weight: 115.8 kg (255 lb 4.7 oz)    Exam:  General: lethargic, in no acute distress.  HEENT: No bruits, no goiter.  Heart: Regular rate and rhythm, without murmurs, rubs, gallops.  Lungs: Good air movement,  bilateral air movement.  Abdomen: Soft, nontender, nondistended, positive bowel sounds.  Neuro: resposive to voice but not able to answer question, responds with mumbles. responsive to noci stimuli   Data Reviewed: Basic Metabolic Panel:  Recent Labs Lab 04/23/13 1830 04/23/13 1846 04/23/13 2240 04/24/13 0332  NA 135 136 135 135  K 4.2 5.4* 4.1 4.0  CL 102 106 102 102  CO2 24  --  24 25  GLUCOSE 95 95 111* 119*  BUN 12 16 11 11   CREATININE 0.86 0.90 0.85 0.83  CALCIUM 9.1  --  8.8 9.0  MG  --   --  1.9  --   PHOS  --   --  3.9  --    Liver Function Tests:  Recent Labs Lab 04/23/13 1830 04/23/13 2240 04/24/13 0332  AST 31 23 23   ALT 22 20 18   ALKPHOS 85 82 81  BILITOT 0.4 0.4 0.5  PROT 6.4 6.0 6.1  ALBUMIN 3.4* 3.3* 3.4*   No results found for this basename: LIPASE, AMYLASE,  in the last 168 hours No results found for this basename: AMMONIA,  in the last 168 hours CBC:  Recent Labs Lab 04/23/13 1830 04/23/13 1846 04/23/13 2240 04/24/13 0332  WBC 5.5  --  6.7 8.9  NEUTROABS 3.0  --  4.8  --   HGB 15.6 16.0 14.7 14.8  HCT 44.8 47.0 43.0 42.6  MCV 81.3  --  81.7 81.5  PLT 213  --  199 201   Cardiac Enzymes: No results found for this basename: CKTOTAL, CKMB, CKMBINDEX, TROPONINI,  in the last 168 hours BNP (last 3 results) No results found for this basename: PROBNP,  in the last 8760 hours CBG: No results found for this basename: GLUCAP,  in the last 168 hours  Recent Results (from the past 240 hour(s))  MRSA PCR SCREENING     Status: Abnormal   Collection Time    04/23/13 10:04 PM      Result Value Range Status   MRSA by PCR POSITIVE (*) NEGATIVE Final   Comment:            The GeneXpert MRSA Assay (FDA     approved for NASAL specimens     only), is one component of a     comprehensive MRSA colonization     surveillance program. It is not     intended to diagnose MRSA     infection nor to guide or     monitor treatment for     MRSA  infections.     RESULT CALLED TO, READ BACK BY AND VERIFIED WITH:     PARKER,A RN @ 2328 ON 08.05.2014 BY MCREYNOLDS,B     Studies: No results found.  Scheduled Meds: . sodium chloride   Intravenous STAT  . lurasidone  40 mg Oral QHS,MR X 1  . oxymetazoline  2 spray Each Nare BID  . pantoprazole  40 mg Oral Daily  . sodium chloride  3 mL Intravenous Q12H   Continuous Infusions: . sodium chloride 1,000 mL (04/23/13 1954)  . sodium chloride 125 mL/hr (04/24/13 0340)     Marinda Elk  [facility] Pager [phone]. If 8PM-8AM, please contact night-coverage at [email or web address], password [facility] 04/24/2013, 7:31 AM  LOS: 1 day

## 2013-04-24 NOTE — Progress Notes (Signed)
PT Cancellation Note  Patient Details Name: Sean Hill MRN: [id number] DOB: June 08, 1941   Cancelled Treatment:    Reason Eval/Treat Not Completed: Patient's level of consciousness;Medical issues which prohibited therapy; Pt extremely confused adn restless per RN; Not appropriate for PT eval at this time.    [facility] 04/24/2013, 10:44 AM

## 2013-04-24 NOTE — Consult Note (Signed)
Reason for Consult: Bipolar disorder, status post suicide attempt with Klonopin intentionally and impulsively Referring Physician: Marinda Elk, MD   Sean Hill is an 72 y.o. male.  HPI: Patient is seen and chart reviewed. Patient wife is at bedside who provided information for this evaluation. Patient has been diagnosed with bipolar disorder and has been treated at private outpatient psychiatric counseling Center with medication management and his medication was recently changed to Jordan about 4 days ago. Patient has been depressed over few weeks and repeatedly makes statements like "I cannot eat like this" in the he also has multiple previous suicide attempts with overdose on his medications and self injurious behaviors. Patient has at least 4 or 582 psychiatric hospitalizations at [facility] and his last admission was March 2014.Patient wife stated that he was feeling his Klonopin about 2 weeks ago for 90 pills and they are 32 previous left after he was overdose on Klonopin this 1 mg. Reportedly he is supposed to take one pill at bedtime. Roughly he was taken 44 mg of Klonopin as overdose. His current medications are Latuda 40 mg a day, Klonopin 1 mg at bedtime for anxiety and trazodone at bedtime for sleep. Patient also has individual therapist [name] who has been seeing for some time.   Mental Status Examination: Patient appeared very drowsy and unable to participate in the evaluation. Rest of the psychiatric mental status examination was not performed at this.  Past Medical History  Diagnosis Date  . Bipolar disorder     Has psychiatrist.  BH admission (suicidal) 07/20/11.  Marland Kitchen Hyperlipemia     Intol of meds: GI side effects  . 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  No hx of MRSA infection.  . 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 Nephrolithiasis   . GERD (gastroesophageal reflux disease)   .  Inguinal hernia 02/25/11    Left sided hernia with fat  . Hip pain, left 2011-2012    Left hip and groin: MRI pelvis and left hip 02/18/11 showed NORMAL hip, with small inguinal hernia containing fat and sigmoid diverticulosis.  Hip pain presumably referred pain from hernia/diverticular dz??.  . Cervical spondylosis     Primarily C4-5 and C5-6--referred to [facility] 02/2011.  . Depression   . Bipolar disorder, current episode depressed, moderate   . Sleep apnea   . Asthma     Past Surgical History  Procedure Laterality Date  . 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 Ankle fracture surgery      hardware still in both ankles (titanium)  . Inguinal hernia repair      Right side with mesh about 1993  . Elbow surgery      left---tendon  . Ankle surgery      Ankle tendon surgery  . Tonsillectomy    . Appendectomy      Family History  Problem Relation Age of Onset  . Alcohol abuse Mother   . Alcohol abuse Father   . Depression Daughter   . Paranoid behavior Daughter     Social History:  reports that he has never smoked. He does not have any smokeless tobacco history on file. He reports that he does not drink alcohol or use illicit drugs.  Allergies:  Allergies  Allergen Reactions  . Codeine   . Penicillins Hives and Other (See Comments)  Watery blisters    Medications: I have reviewed the patient's current medications.  Results for orders placed during the hospital encounter of 04/23/13 (from the past 48 hour(s))  URINE RAPID DRUG SCREEN (HOSP PERFORMED)     Status: None   Collection Time    04/23/13  6:21 PM      Result Value Range   Opiates NONE DETECTED  NONE DETECTED   Cocaine NONE DETECTED  NONE DETECTED   Benzodiazepines NONE DETECTED  NONE DETECTED   Amphetamines NONE DETECTED  NONE DETECTED   Tetrahydrocannabinol NONE DETECTED  NONE DETECTED   Barbiturates NONE DETECTED  NONE DETECTED   Comment:            DRUG  SCREEN FOR MEDICAL PURPOSES     ONLY.  IF CONFIRMATION IS NEEDED     FOR ANY PURPOSE, NOTIFY LAB     WITHIN 5 DAYS.                LOWEST DETECTABLE LIMITS     FOR URINE DRUG SCREEN     Drug Class       Cutoff (ng/mL)     Amphetamine      1000     Barbiturate      200     Benzodiazepine   200     Tricyclics       300     Opiates          300     Cocaine          300     THC              50  URINALYSIS, ROUTINE W REFLEX MICROSCOPIC     Status: None   Collection Time    04/23/13  6:21 PM      Result Value Range   Color, Urine YELLOW  YELLOW   APPearance CLEAR  CLEAR   Specific Gravity, Urine 1.015  1.005 - 1.030   pH 5.5  5.0 - 8.0   Glucose, UA NEGATIVE  NEGATIVE mg/dL   Hgb urine dipstick NEGATIVE  NEGATIVE   Bilirubin Urine NEGATIVE  NEGATIVE   Ketones, ur NEGATIVE  NEGATIVE mg/dL   Protein, ur NEGATIVE  NEGATIVE mg/dL   Urobilinogen, UA 0.2  0.0 - 1.0 mg/dL   Nitrite NEGATIVE  NEGATIVE   Leukocytes, UA NEGATIVE  NEGATIVE   Comment: MICROSCOPIC NOT DONE ON URINES WITH NEGATIVE PROTEIN, BLOOD, LEUKOCYTES, NITRITE, OR GLUCOSE <1000 mg/dL.  ACETAMINOPHEN LEVEL     Status: None   Collection Time    04/23/13  6:30 PM      Result Value Range   Acetaminophen (Tylenol), Serum <15.0  10 - 30 ug/mL   Comment:            THERAPEUTIC CONCENTRATIONS VARY     SIGNIFICANTLY. A RANGE OF 10-30     ug/mL MAY BE AN EFFECTIVE     CONCENTRATION FOR MANY PATIENTS.     HOWEVER, SOME ARE BEST TREATED     AT CONCENTRATIONS OUTSIDE THIS     RANGE.     ACETAMINOPHEN CONCENTRATIONS     >150 ug/mL AT 4 HOURS AFTER     INGESTION AND >50 ug/mL AT 12     HOURS AFTER INGESTION ARE     OFTEN ASSOCIATED WITH TOXIC     REACTIONS.  CBC WITH DIFFERENTIAL     Status: None   Collection Time    04/23/13  6:30 PM  Result Value Range   WBC 5.5  4.0 - 10.5 K/uL   RBC 5.51  4.22 - 5.81 MIL/uL   Hemoglobin 15.6  13.0 - 17.0 g/dL   HCT 16.1  09.6 - 04.5 %   MCV 81.3  78.0 - 100.0 fL   MCH 28.3   26.0 - 34.0 pg   MCHC 34.8  30.0 - 36.0 g/dL   RDW 40.9  81.1 - 91.4 %   Platelets 213  150 - 400 K/uL   Neutrophils Relative % 55  43 - 77 %   Neutro Abs 3.0  1.7 - 7.7 K/uL   Lymphocytes Relative 33  12 - 46 %   Lymphs Abs 1.8  0.7 - 4.0 K/uL   Monocytes Relative 10  3 - 12 %   Monocytes Absolute 0.5  0.1 - 1.0 K/uL   Eosinophils Relative 2  0 - 5 %   Eosinophils Absolute 0.1  0.0 - 0.7 K/uL   Basophils Relative 1  0 - 1 %   Basophils Absolute 0.1  0.0 - 0.1 K/uL  COMPREHENSIVE METABOLIC PANEL     Status: Abnormal   Collection Time    04/23/13  6:30 PM      Result Value Range   Sodium 135  135 - 145 mEq/L   Potassium 4.2  3.5 - 5.1 mEq/L   Comment: SLIGHT HEMOLYSIS   Chloride 102  96 - 112 mEq/L   CO2 24  19 - 32 mEq/L   Glucose, Bld 95  70 - 99 mg/dL   BUN 12  6 - 23 mg/dL   Creatinine, Ser 7.82  0.50 - 1.35 mg/dL   Calcium 9.1  8.4 - 95.6 mg/dL   Total Protein 6.4  6.0 - 8.3 g/dL   Albumin 3.4 (*) 3.5 - 5.2 g/dL   AST 31  0 - 37 U/L   ALT 22  0 - 53 U/L   Alkaline Phosphatase 85  39 - 117 U/L   Total Bilirubin 0.4  0.3 - 1.2 mg/dL   GFR calc non Af Amer 86 (*) >90 mL/min   GFR calc Af Amer >90  >90 mL/min   Comment:            The eGFR has been calculated     using the CKD EPI equation.     This calculation has not been     validated in all clinical     situations.     eGFR's persistently     <90 mL/min signify     possible Chronic Kidney Disease.  ETHANOL     Status: None   Collection Time    04/23/13  6:30 PM      Result Value Range   Alcohol, Ethyl (B) <11  0 - 11 mg/dL   Comment:            LOWEST DETECTABLE LIMIT FOR     SERUM ALCOHOL IS 11 mg/dL     FOR MEDICAL PURPOSES ONLY  SALICYLATE LEVEL     Status: Abnormal   Collection Time    04/23/13  6:30 PM      Result Value Range   Salicylate Lvl <2.0 (*) 2.8 - 20.0 mg/dL  POCT I-STAT, CHEM 8     Status: Abnormal   Collection Time    04/23/13  6:46 PM      Result Value Range   Sodium 136  135 - 145  mEq/L   Potassium 5.4 (*) 3.5 -  5.1 mEq/L   Chloride 106  96 - 112 mEq/L   BUN 16  6 - 23 mg/dL   Creatinine, Ser 1.61  0.50 - 1.35 mg/dL   Glucose, Bld 95  70 - 99 mg/dL   Calcium, Ion 0.96 (*) 1.13 - 1.30 mmol/L   TCO2 25  0 - 100 mmol/L   Hemoglobin 16.0  13.0 - 17.0 g/dL   HCT 04.5  40.9 - 81.1 %  MRSA PCR SCREENING     Status: Abnormal   Collection Time    04/23/13 10:04 PM      Result Value Range   MRSA by PCR POSITIVE (*) NEGATIVE   Comment:            The GeneXpert MRSA Assay (FDA     approved for NASAL specimens     only), is one component of a     comprehensive MRSA colonization     surveillance program. It is not     intended to diagnose MRSA     infection nor to guide or     monitor treatment for     MRSA infections.     RESULT CALLED TO, READ BACK BY AND VERIFIED WITH:     PARKER,A RN @ 2328 ON 08.05.2014 BY MCREYNOLDS,B  COMPREHENSIVE METABOLIC PANEL     Status: Abnormal   Collection Time    04/23/13 10:40 PM      Result Value Range   Sodium 135  135 - 145 mEq/L   Potassium 4.1  3.5 - 5.1 mEq/L   Comment: DELTA CHECK NOTED     REPEATED TO VERIFY   Chloride 102  96 - 112 mEq/L   CO2 24  19 - 32 mEq/L   Glucose, Bld 111 (*) 70 - 99 mg/dL   BUN 11  6 - 23 mg/dL   Creatinine, Ser 9.14  0.50 - 1.35 mg/dL   Calcium 8.8  8.4 - 78.2 mg/dL   Total Protein 6.0  6.0 - 8.3 g/dL   Albumin 3.3 (*) 3.5 - 5.2 g/dL   AST 23  0 - 37 U/L   ALT 20  0 - 53 U/L   Alkaline Phosphatase 82  39 - 117 U/L   Total Bilirubin 0.4  0.3 - 1.2 mg/dL   GFR calc non Af Amer 86 (*) >90 mL/min   GFR calc Af Amer >90  >90 mL/min   Comment:            The eGFR has been calculated     using the CKD EPI equation.     This calculation has not been     validated in all clinical     situations.     eGFR's persistently     <90 mL/min signify     possible Chronic Kidney Disease.  MAGNESIUM     Status: None   Collection Time    04/23/13 10:40 PM      Result Value Range   Magnesium 1.9   1.5 - 2.5 mg/dL  PHOSPHORUS     Status: None   Collection Time    04/23/13 10:40 PM      Result Value Range   Phosphorus 3.9  2.3 - 4.6 mg/dL  CBC WITH DIFFERENTIAL     Status: None   Collection Time    04/23/13 10:40 PM      Result Value Range   WBC 6.7  4.0 - 10.5 K/uL   RBC 5.26  4.22 -  5.81 MIL/uL   Hemoglobin 14.7  13.0 - 17.0 g/dL   HCT 40.9  81.1 - 91.4 %   MCV 81.7  78.0 - 100.0 fL   MCH 27.9  26.0 - 34.0 pg   MCHC 34.2  30.0 - 36.0 g/dL   RDW 78.2  95.6 - 21.3 %   Platelets 199  150 - 400 K/uL   Neutrophils Relative % 73  43 - 77 %   Neutro Abs 4.8  1.7 - 7.7 K/uL   Lymphocytes Relative 19  12 - 46 %   Lymphs Abs 1.3  0.7 - 4.0 K/uL   Monocytes Relative 8  3 - 12 %   Monocytes Absolute 0.5  0.1 - 1.0 K/uL   Eosinophils Relative 1  0 - 5 %   Eosinophils Absolute 0.0  0.0 - 0.7 K/uL   Basophils Relative 0  0 - 1 %   Basophils Absolute 0.0  0.0 - 0.1 K/uL  APTT     Status: None   Collection Time    04/23/13 10:40 PM      Result Value Range   aPTT 31  24 - 37 seconds  PROTIME-INR     Status: None   Collection Time    04/23/13 10:40 PM      Result Value Range   Prothrombin Time 14.2  11.6 - 15.2 seconds   INR 1.12  0.00 - 1.49  TSH     Status: None   Collection Time    04/23/13 10:40 PM      Result Value Range   TSH 0.777  0.350 - 4.500 uIU/mL   Comment: Performed at [facility]  COMPREHENSIVE METABOLIC PANEL     Status: Abnormal   Collection Time    04/24/13  3:32 AM      Result Value Range   Sodium 135  135 - 145 mEq/L   Potassium 4.0  3.5 - 5.1 mEq/L   Chloride 102  96 - 112 mEq/L   CO2 25  19 - 32 mEq/L   Glucose, Bld 119 (*) 70 - 99 mg/dL   BUN 11  6 - 23 mg/dL   Creatinine, Ser 0.86  0.50 - 1.35 mg/dL   Calcium 9.0  8.4 - 57.8 mg/dL   Total Protein 6.1  6.0 - 8.3 g/dL   Albumin 3.4 (*) 3.5 - 5.2 g/dL   AST 23  0 - 37 U/L   ALT 18  0 - 53 U/L   Alkaline Phosphatase 81  39 - 117 U/L   Total Bilirubin 0.5  0.3 - 1.2 mg/dL   GFR calc non  Af Amer 87 (*) >90 mL/min   GFR calc Af Amer >90  >90 mL/min   Comment:            The eGFR has been calculated     using the CKD EPI equation.     This calculation has not been     validated in all clinical     situations.     eGFR's persistently     <90 mL/min signify     possible Chronic Kidney Disease.  CBC     Status: None   Collection Time    04/24/13  3:32 AM      Result Value Range   WBC 8.9  4.0 - 10.5 K/uL   RBC 5.23  4.22 - 5.81 MIL/uL   Hemoglobin 14.8  13.0 - 17.0 g/dL   HCT 42.6  39.0 - 52.0 %   MCV 81.5  78.0 - 100.0 fL   MCH 28.3  26.0 - 34.0 pg   MCHC 34.7  30.0 - 36.0 g/dL   RDW 16.1  09.6 - 04.5 %   Platelets 201  150 - 400 K/uL  GLUCOSE, CAPILLARY     Status: Abnormal   Collection Time    04/24/13  7:59 AM      Result Value Range   Glucose-Capillary 101 (*) 70 - 99 mg/dL    Ct Head Wo Contrast  04/24/2013   *RADIOLOGY REPORT*  Clinical Data: Encephalopathy.  Altered level of consciousness.  CT HEAD WITHOUT CONTRAST  Technique:  Contiguous axial images were obtained from the base of the skull through the vertex without contrast.  Comparison: None.  Findings: There is no acute intracranial hemorrhage, infarction, or mass lesion.  Brain parenchyma is normal.  The osseous structures are normal.  IMPRESSION: Normal exam.   Original Report Authenticated By: Francene Boyers, M.D.    Positive for bipolar, depression and Intensive suicide attempt by overdose on his medication Blood pressure 94/60, pulse 81, temperature 98.3 F (36.8 C), temperature source Oral, resp. rate 22, height 6' (1.829 m), weight 115.8 kg (255 lb 4.7 oz), SpO2 97.00%.   Assessment/Plan: Bipolar disorder most recent episode is mixed Status post benzodiazepine overdose as a suicide attempt  Psychiatric consultation will followup with patient when he is medically stable enough to participate in psychiatric evaluation.    Nehemiah Settle., MD 04/24/2013, 5:29 PM

## 2013-04-25 ENCOUNTER — Inpatient Hospital Stay (HOSPITAL_COMMUNITY): Payer: Medicare Other

## 2013-04-25 DIAGNOSIS — K219 Gastro-esophageal reflux disease without esophagitis: Secondary | ICD-10-CM

## 2013-04-25 LAB — CK: Total CK: 2130 U/L — ABNORMAL HIGH (ref 7–232)

## 2013-04-25 LAB — GLUCOSE, CAPILLARY: Glucose-Capillary: 86 mg/dL (ref 70–99)

## 2013-04-25 MED ORDER — HALOPERIDOL LACTATE 5 MG/ML IJ SOLN
5.0000 mg | Freq: Four times a day (QID) | INTRAMUSCULAR | Status: DC | PRN
  Administered 2013-04-25: 5 mg via INTRAVENOUS
  Filled 2013-04-25: qty 1

## 2013-04-25 MED ORDER — DEXTROSE-NACL 5-0.45 % IV SOLN
INTRAVENOUS | Status: DC
  Administered 2013-04-25 – 2013-04-27 (×6): via INTRAVENOUS
  Administered 2013-04-27: 1000 mL via INTRAVENOUS
  Administered 2013-04-28 – 2013-04-29 (×5): via INTRAVENOUS

## 2013-04-25 MED ORDER — HALOPERIDOL LACTATE 5 MG/ML IJ SOLN
2.0000 mg | Freq: Once | INTRAMUSCULAR | Status: AC
  Administered 2013-04-25: 2 mg via INTRAVENOUS

## 2013-04-25 MED ORDER — BIOTENE DRY MOUTH MT LIQD
15.0000 mL | Freq: Two times a day (BID) | OROMUCOSAL | Status: DC
  Administered 2013-04-25 – 2013-04-28 (×5): 15 mL via OROMUCOSAL

## 2013-04-25 MED ORDER — LORAZEPAM 2 MG/ML IJ SOLN
1.0000 mg | Freq: Three times a day (TID) | INTRAMUSCULAR | Status: DC | PRN
  Administered 2013-04-25 – 2013-04-26 (×2): 1 mg via INTRAVENOUS
  Filled 2013-04-25 (×6): qty 1

## 2013-04-25 MED ORDER — HALOPERIDOL LACTATE 5 MG/ML IJ SOLN
1.0000 mg | Freq: Four times a day (QID) | INTRAMUSCULAR | Status: DC | PRN
  Administered 2013-04-25 – 2013-04-27 (×5): 1 mg via INTRAVENOUS
  Filled 2013-04-25 (×9): qty 1

## 2013-04-25 MED ORDER — CHLORHEXIDINE GLUCONATE 0.12 % MT SOLN
15.0000 mL | Freq: Two times a day (BID) | OROMUCOSAL | Status: DC
  Administered 2013-04-25 – 2013-04-29 (×7): 15 mL via OROMUCOSAL
  Filled 2013-04-25 (×9): qty 15

## 2013-04-25 MED ORDER — LORAZEPAM 2 MG/ML IJ SOLN
2.0000 mg | Freq: Four times a day (QID) | INTRAMUSCULAR | Status: DC | PRN
  Administered 2013-04-25: 2 mg via INTRAVENOUS
  Filled 2013-04-25: qty 1

## 2013-04-25 MED ORDER — LORAZEPAM 2 MG/ML IJ SOLN
1.0000 mg | Freq: Once | INTRAMUSCULAR | Status: AC
  Administered 2013-04-25: 1 mg via INTRAVENOUS

## 2013-04-25 NOTE — Progress Notes (Signed)
PT Cancellation Note  Patient Details Name: Sean Hill MRN: [id number] DOB: 06-Apr-1941   Cancelled Treatment:    Reason Eval/Treat Not Completed: Patient's level of consciousness;Medical issues which prohibited therapy   [facility] 04/25/2013, 11:00 AM

## 2013-04-25 NOTE — Progress Notes (Signed)
Nutrition Brief Note  Patient identified on the [facility] Report   Wt Readings from Last 15 Encounters:  04/23/13 255 lb 4.7 oz (115.8 kg)  04/08/13 251 lb (113.853 kg)  02/20/13 261 lb (118.389 kg)  02/12/13 256 lb (116.121 kg)  01/29/13 256 lb (116.121 kg)  12/27/12 252 lb (114.306 kg)  12/04/12 256 lb (116.121 kg)  10/05/12 260 lb (117.935 kg)  03/28/12 257 lb (116.574 kg)  02/21/12 259 lb (117.482 kg)  11/28/11 258 lb (117.028 kg)  11/04/11 263 lb (119.296 kg)  07/20/11 250 lb 15.9 oz (113.85 kg)  05/18/11 262 lb (118.842 kg)  03/08/11 259 lb (117.482 kg)    Body mass index is 34.62 kg/(m^2). Patient meets criteria for obesity based on current BMI.   Current diet order is NPO. Labs and medications reviewed.   Pt asleep and unarousable in room. Admitted with intentional overdose on medication. Per weight history and RN, no weight loss has occurred.   No nutrition interventions warranted at this time. If nutrition issues arise, please consult RD.   Ebbie Latus RD, LDN

## 2013-04-25 NOTE — Progress Notes (Signed)
On assessment, pt agitated and restless in bed. Sitter unable to calm pt. Pt pulling at nasal canal and mittens, risk of pulling out PIV. 1mg  haldol given. No effect noticed after administration. Claiborne Billings paged by this RN to ask for PRN. Claiborne Billings paged back and will write for one time dose of ativan 1mg .

## 2013-04-25 NOTE — Progress Notes (Signed)
Patient scheduled for MRI at [facility] 2pm 04/26/13.  Per Interventional Radiology staff at [facility], patient needs to be arrived to [facility] at noon.  [facility] contacted regarding arrangements for roundtrip transfer.  [facility] to arrive at [facility] ICU/SD between 1100-1130.  Kinnie Feil, RN

## 2013-04-25 NOTE — Progress Notes (Signed)
Two earrings, a wedding band and an additional celtic ring were removed from the patient prior to imaging studies.  Jewelry was given to patient's wife who was at the bedside.  Kinnie Feil, RN

## 2013-04-25 NOTE — Progress Notes (Signed)
[facility] PROGRESS NOTE Interim History: 72 year old male with past medical history of bipolar disorder, depression, GERD who was brought to [facility] ED due to altered mental status. Per family (not present at this time at the bedside) patient has taken Klonopin and Prilosec, multiple tablets and was lethargic and unresponsive. History is not provided by the patient due to altered mental status. It was a 90 bottle pill  and 32 pills were left on arrival to the ED.   Assessment/Plan:  Acute encephalopathy: - EKG QTc < 440, cont to monitor sats, cont O2 support. - MRI pending. - No sign of infection, electrolytes with normal limits. - No history of ETOH abuse. Bun/Cr with in normal limits.  LFT's with in normal limits. - No events on telemetry. - Has not taken any of his medications. - No BZD's on urine (klonipin should be pick up by UDS). CT negative for bleed,  Concern about a posterior fossa stroke, need an MRI.  Bipolar disorder - pending, pt not cooperating.   Code Status: full Family Communication: wife  Disposition Plan: inapteint   Consultants:  psy  Procedures:  CT head  Antibiotics:  None  HPI/Subjective: Restless, nonverbal.  Objective: Filed Vitals:   04/24/13 1933 04/24/13 2200 04/25/13 0046 04/25/13 0344  BP: 126/63  123/71 127/79  Pulse: 89  90 85  Temp: 99.4 F (37.4 C)     TempSrc: Axillary     Resp: 25 30 22 24   Height:      Weight:      SpO2: 93% 88% 92% 95%    Intake/Output Summary (Last 24 hours) at 04/25/13 0721 Last data filed at 04/25/13 0600  Gross per 24 hour  Intake   2628 ml  Output      0 ml  Net   2628 ml   Filed Weights   04/23/13 2115  Weight: 115.8 kg (255 lb 4.7 oz)    Exam:  General: lethargic, in no acute distress.  HEENT: No bruits, no goiter.  Heart: Regular rate and rhythm, without murmurs, rubs, gallops.  Lungs: Good air movement, bilateral air movement.  Abdomen: Soft, nontender, nondistended, positive  bowel sounds.  Neuro: resposive to voice but not able to answer question, responds with mumbles. responsive to noci stimuli   Data Reviewed: Basic Metabolic Panel:  Recent Labs Lab 04/23/13 1830 04/23/13 1846 04/23/13 2240 04/24/13 0332  NA 135 136 135 135  K 4.2 5.4* 4.1 4.0  CL 102 106 102 102  CO2 24  --  24 25  GLUCOSE 95 95 111* 119*  BUN 12 16 11 11   CREATININE 0.86 0.90 0.85 0.83  CALCIUM 9.1  --  8.8 9.0  MG  --   --  1.9  --   PHOS  --   --  3.9  --    Liver Function Tests:  Recent Labs Lab 04/23/13 1830 04/23/13 2240 04/24/13 0332  AST 31 23 23   ALT 22 20 18   ALKPHOS 85 82 81  BILITOT 0.4 0.4 0.5  PROT 6.4 6.0 6.1  ALBUMIN 3.4* 3.3* 3.4*   No results found for this basename: LIPASE, AMYLASE,  in the last 168 hours No results found for this basename: AMMONIA,  in the last 168 hours CBC:  Recent Labs Lab 04/23/13 1830 04/23/13 1846 04/23/13 2240 04/24/13 0332  WBC 5.5  --  6.7 8.9  NEUTROABS 3.0  --  4.8  --   HGB 15.6 16.0 14.7 14.8  HCT  44.8 47.0 43.0 42.6  MCV 81.3  --  81.7 81.5  PLT 213  --  199 201   Cardiac Enzymes: No results found for this basename: CKTOTAL, CKMB, CKMBINDEX, TROPONINI,  in the last 168 hours BNP (last 3 results) No results found for this basename: PROBNP,  in the last 8760 hours CBG:  Recent Labs Lab 04/24/13 0759  GLUCAP 101*    Recent Results (from the past 240 hour(s))  MRSA PCR SCREENING     Status: Abnormal   Collection Time    04/23/13 10:04 PM      Result Value Range Status   MRSA by PCR POSITIVE (*) NEGATIVE Final   Comment:            The GeneXpert MRSA Assay (FDA     approved for NASAL specimens     only), is one component of a     comprehensive MRSA colonization     surveillance program. It is not     intended to diagnose MRSA     infection nor to guide or     monitor treatment for     MRSA infections.     RESULT CALLED TO, READ BACK BY AND VERIFIED WITH:     PARKER,A RN @ 2328 ON  08.05.2014 BY MCREYNOLDS,B     Studies: Ct Head Wo Contrast  04/24/2013   *RADIOLOGY REPORT*  Clinical Data: Encephalopathy.  Altered level of consciousness.  CT HEAD WITHOUT CONTRAST  Technique:  Contiguous axial images were obtained from the base of the skull through the vertex without contrast.  Comparison: None.  Findings: There is no acute intracranial hemorrhage, infarction, or mass lesion.  Brain parenchyma is normal.  The osseous structures are normal.  IMPRESSION: Normal exam.   Original Report Authenticated By: Francene Boyers, M.D.    Scheduled Meds: . Chlorhexidine Gluconate Cloth  6 each Topical Q0600  . lurasidone  40 mg Oral QHS,MR X 1  . mupirocin ointment  1 application Nasal BID  . oxymetazoline  2 spray Each Nare BID  . sodium chloride  3 mL Intravenous Q12H   Continuous Infusions: . sodium chloride 1,000 mL (04/23/13 1954)  . sodium chloride 125 mL/hr (04/25/13 0341)     Marinda Elk  [facility] Pager [phone]. If 8PM-8AM, please contact night-coverage at [email or web address], password [facility] 04/25/2013, 7:21 AM  LOS: 2 days

## 2013-04-25 NOTE — Progress Notes (Signed)
Clinical Social Work  CSW attempted to meet with patient in order to complete assessment. Per sitter, patient remains disoriented. CSW will follow up when patient is alert and oriented in order to complete assessment.  [place], [place] [phone]

## 2013-04-25 NOTE — Progress Notes (Signed)
Pt now sleeping but will occasionally wake and become agitated and restless. Benedetto Coons updated on patient status.

## 2013-04-26 ENCOUNTER — Ambulatory Visit (HOSPITAL_COMMUNITY)
Admit: 2013-04-26 | Discharge: 2013-04-26 | Disposition: A | Discharge status: Home / Self Care | Payer: Medicare Other | Attending: Internal Medicine | Admitting: Internal Medicine

## 2013-04-26 LAB — CBC
MCH: 28.4 pg (ref 26.0–34.0)
MCHC: 35 g/dL (ref 30.0–36.0)
RDW: 14.4 % (ref 11.5–15.5)

## 2013-04-26 LAB — BASIC METABOLIC PANEL
BUN: 8 mg/dL (ref 6–23)
Calcium: 9.2 mg/dL (ref 8.4–10.5)
GFR calc Af Amer: >90 mL/min (ref >90)
GFR calc non Af Amer: 84 mL/min — ABNORMAL LOW (ref >90)
Potassium: 3.7 mEq/L (ref 3.5–5.1)
Sodium: 138 mEq/L (ref 135–145)

## 2013-04-26 MED ORDER — MIDAZOLAM HCL 2 MG/2ML IJ SOLN
1.0000 mg | INTRAMUSCULAR | Status: DC | PRN
  Administered 2013-04-26 (×4): 2 mg via INTRAVENOUS

## 2013-04-26 MED ORDER — LORAZEPAM 2 MG/ML IJ SOLN
2.0000 mg | Freq: Once | INTRAMUSCULAR | Status: AC
  Administered 2013-04-26: 2 mg via INTRAVENOUS

## 2013-04-26 MED ORDER — FENTANYL CITRATE 0.05 MG/ML IJ SOLN
25.0000 ug | INTRAMUSCULAR | Status: DC | PRN
  Administered 2013-04-26 (×3): 50 ug via INTRAVENOUS

## 2013-04-26 MED ORDER — FENTANYL CITRATE 0.05 MG/ML IJ SOLN
INTRAMUSCULAR | Status: AC
  Filled 2013-04-26: qty 4

## 2013-04-26 MED ORDER — LORAZEPAM 2 MG/ML IJ SOLN
1.0000 mg | Freq: Once | INTRAMUSCULAR | Status: AC
  Administered 2013-04-26: 1 mg via INTRAVENOUS

## 2013-04-26 MED ORDER — HALOPERIDOL LACTATE 5 MG/ML IJ SOLN
4.0000 mg | Freq: Once | INTRAMUSCULAR | Status: AC
  Administered 2013-04-26: 4 mg via INTRAVENOUS

## 2013-04-26 MED ORDER — MIDAZOLAM HCL 2 MG/2ML IJ SOLN
INTRAMUSCULAR | Status: AC
  Filled 2013-04-26: qty 10

## 2013-04-26 MED ORDER — MORPHINE SULFATE 2 MG/ML IJ SOLN
2.0000 mg | INTRAMUSCULAR | Status: DC | PRN
  Administered 2013-04-26 – 2013-04-27 (×3): 2 mg via INTRAVENOUS
  Filled 2013-04-26 (×4): qty 1

## 2013-04-26 NOTE — Progress Notes (Signed)
[facility] PROGRESS NOTE Interim History: 72 year old male with past medical history of bipolar disorder, depression, GERD who was brought to [facility] ED due to altered mental status. Per family (not present at this time at the bedside) patient has taken Klonopin and Prilosec, multiple tablets and was lethargic and unresponsive. History is not provided by the patient due to altered mental status. It was a 90 bottle pill  and 32 pills were left on arrival to the ED.   Assessment/Plan:  Acute encephalopathy: - continues to be agitated not responding to commands. - check EKG QTc cont to monitor sats, cont O2 support. - MRI with conscious sedation pending. - Unclear etiology. - Has not taken any of his medications. - No BZD's on urine (klonipin should be pick up by UDS). CT negative for bleed,  Concern about a posterior fossa stroke, need an MRI. - change fluids to d 0.45%. Ativan and haldol as needed. ? Delirium, avoid high dose BZD's.   Bipolar disorder - pending, pt not cooperating.  - psyq consulted.  Code Status: full Family Communication: wife  Disposition Plan: inapteint   Consultants:  psy  Procedures:  CT head  Antibiotics:  None  HPI/Subjective: Restless, nonverbal.  Objective: Filed Vitals:   04/26/13 0300 04/26/13 0400 04/26/13 0500 04/26/13 0600  BP:  132/58    Pulse: 80 88 84 87  Temp:  98.6 F (37 C)    TempSrc:  Axillary    Resp: 23 24 25 21   Height:      Weight:  109.8 kg (242 lb 1 oz)    SpO2: 94% 95% 97% 92%    Intake/Output Summary (Last 24 hours) at 04/26/13 0739 Last data filed at 04/26/13 0600  Gross per 24 hour  Intake   2881 ml  Output   3040 ml  Net   -159 ml   Filed Weights   04/23/13 2115 04/26/13 0400  Weight: 115.8 kg (255 lb 4.7 oz) 109.8 kg (242 lb 1 oz)    Exam:  General: lethargic, in no acute distress.  HEENT: No bruits, no goiter.  Heart: Regular rate and rhythm, without murmurs, rubs, gallops.  Lungs: Good air  movement, bilateral air movement.  Abdomen: Soft, nontender, nondistended, positive bowel sounds.  Neuro: resposive to voice but not able to answer question, responds with mumbles. responsive to noci stimuli   Data Reviewed: Basic Metabolic Panel:  Recent Labs Lab 04/23/13 1830 04/23/13 1846 04/23/13 2240 04/24/13 0332 04/26/13 0530  NA 135 136 135 135 138  K 4.2 5.4* 4.1 4.0 3.7  CL 102 106 102 102 102  CO2 24  --  24 25 25   GLUCOSE 95 95 111* 119* 109*  BUN 12 16 11 11 8   CREATININE 0.86 0.90 0.85 0.83 0.90  CALCIUM 9.1  --  8.8 9.0 9.2  MG  --   --  1.9  --   --   PHOS  --   --  3.9  --   --    Liver Function Tests:  Recent Labs Lab 04/23/13 1830 04/23/13 2240 04/24/13 0332  AST 31 23 23   ALT 22 20 18   ALKPHOS 85 82 81  BILITOT 0.4 0.4 0.5  PROT 6.4 6.0 6.1  ALBUMIN 3.4* 3.3* 3.4*   No results found for this basename: LIPASE, AMYLASE,  in the last 168 hours No results found for this basename: AMMONIA,  in the last 168 hours CBC:  Recent Labs Lab 04/23/13 1830 04/23/13 1846  04/23/13 2240 04/24/13 0332 04/26/13 0530  WBC 5.5  --  6.7 8.9 9.4  NEUTROABS 3.0  --  4.8  --   --   HGB 15.6 16.0 14.7 14.8 15.8  HCT 44.8 47.0 43.0 42.6 45.1  MCV 81.3  --  81.7 81.5 81.1  PLT 213  --  199 201 211   Cardiac Enzymes:  Recent Labs Lab 04/25/13 1355  CKTOTAL 2130*   BNP (last 3 results) No results found for this basename: PROBNP,  in the last 8760 hours CBG:  Recent Labs Lab 04/24/13 0759 04/25/13 0755  GLUCAP 101* 86    Recent Results (from the past 240 hour(s))  MRSA PCR SCREENING     Status: Abnormal   Collection Time    04/23/13 10:04 PM      Result Value Range Status   MRSA by PCR POSITIVE (*) NEGATIVE Final   Comment:            The GeneXpert MRSA Assay (FDA     approved for NASAL specimens     only), is one component of a     comprehensive MRSA colonization     surveillance program. It is not     intended to diagnose MRSA      infection nor to guide or     monitor treatment for     MRSA infections.     RESULT CALLED TO, READ BACK BY AND VERIFIED WITH:     PARKER,A RN @ 2328 ON 08.05.2014 BY MCREYNOLDS,B     Studies: Ct Head Wo Contrast  04/24/2013   *RADIOLOGY REPORT*  Clinical Data: Encephalopathy.  Altered level of consciousness.  CT HEAD WITHOUT CONTRAST  Technique:  Contiguous axial images were obtained from the base of the skull through the vertex without contrast.  Comparison: None.  Findings: There is no acute intracranial hemorrhage, infarction, or mass lesion.  Brain parenchyma is normal.  The osseous structures are normal.  IMPRESSION: Normal exam.   Original Report Authenticated By: Francene Boyers, M.D.    Scheduled Meds: . antiseptic oral rinse  15 mL Mouth Rinse q12n4p  . chlorhexidine  15 mL Mouth Rinse BID  . Chlorhexidine Gluconate Cloth  6 each Topical Q0600  . lurasidone  40 mg Oral QHS,MR X 1  . mupirocin ointment  1 application Nasal BID  . oxymetazoline  2 spray Each Nare BID  . sodium chloride  3 mL Intravenous Q12H   Continuous Infusions: . dextrose 5 % and 0.45% NaCl 125 mL/hr at 04/26/13 0041     Marinda Elk  [facility] Pager [phone]. If 8PM-8AM, please contact night-coverage at [email or web address], password [facility] 04/26/2013, 7:39 AM  LOS: 3 days

## 2013-04-26 NOTE — Progress Notes (Signed)
Pt remains agitated, restless, pulling at lines and monitoring wires, and is combative with RN and sitter at bedside. Benedetto Coons aware and has written several one time doses for ativan and haldol. Since 1AM pt has received 2mg  ativan and 5mg  haldol. Despite increasing medication dosage, pt remains restless and thrashing in bed.

## 2013-04-26 NOTE — Progress Notes (Signed)
Patient asleep at this time, appears to be resting well. Occasional sleep interruptions lead to restlessness but patient is able to fall back asleep. Sitter remains at bedside, patient continues to wear mittens to prevent the pulling of lines and tubes. Will continue to monitor.

## 2013-04-26 NOTE — Progress Notes (Signed)
Patient ID: Sean Hill, male   DOB: 1941/09/06, 72 y.o.   MRN: [id number] Request received for MRI brain with IV conscious sedation today on pt with hx AMS/acute encephalopathy. Pt has known bipolar disorder and was brought to [facility] ER 8/5 with AMS, lethargy of uncertain etiology. CT head was neg for acute changes. Additional PMH as below. Exam: pt uncooperative, moaning and agitated. Chest- CTA bilat; heart- RRR; abd- soft, +BS,NT.    Filed Vitals:   04/26/13 1223  BP: 125/70  Pulse: 78  Resp: 20  SpO2: 99%  ] Past Medical History  Diagnosis Date  . Bipolar disorder     Has psychiatrist.  BH admission (suicidal) 07/20/11.  Marland Kitchen Hyperlipemia     Intol of meds: GI side effects  . 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  No hx of MRSA infection.  . 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 Nephrolithiasis   . GERD (gastroesophageal reflux disease)   . Inguinal hernia 02/25/11    Left sided hernia with fat  . Hip pain, left 2011-2012    Left hip and groin: MRI pelvis and left hip 02/18/11 showed NORMAL hip, with small inguinal hernia containing fat and sigmoid diverticulosis.  Hip pain presumably referred pain from hernia/diverticular dz??.  . Cervical spondylosis     Primarily C4-5 and C5-6--referred to [facility] 02/2011.  . Depression   . Bipolar disorder, current episode depressed, moderate   . Sleep apnea   . Asthma    Past Surgical History  Procedure Laterality Date  . 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 Ankle fracture surgery      hardware still in both ankles (titanium)  . Inguinal hernia repair      Right side with mesh about 1993  . Elbow surgery      left---tendon  . Ankle surgery      Ankle tendon surgery  . Tonsillectomy    . Appendectomy     Ct Head Wo Contrast  04/24/2013   *RADIOLOGY REPORT*  Clinical Data: Encephalopathy.  Altered level of consciousness.  CT HEAD  WITHOUT CONTRAST  Technique:  Contiguous axial images were obtained from the base of the skull through the vertex without contrast.  Comparison: None.  Findings: There is no acute intracranial hemorrhage, infarction, or mass lesion.  Brain parenchyma is normal.  The osseous structures are normal.  IMPRESSION: Normal exam.   Original Report Authenticated By: Francene Boyers, M.D.  Results for orders placed during the hospital encounter of 04/23/13  MRSA PCR SCREENING      Result Value Range   MRSA by PCR POSITIVE (*) NEGATIVE  ACETAMINOPHEN LEVEL      Result Value Range   Acetaminophen (Tylenol), Serum <15.0  10 - 30 ug/mL  CBC WITH DIFFERENTIAL      Result Value Range   WBC 5.5  4.0 - 10.5 K/uL   RBC 5.51  4.22 - 5.81 MIL/uL   Hemoglobin 15.6  13.0 - 17.0 g/dL   HCT 11.9  14.7 - 82.9 %   MCV 81.3  78.0 - 100.0 fL   MCH 28.3  26.0 - 34.0 pg   MCHC 34.8  30.0 - 36.0 g/dL   RDW 56.2  13.0 - 86.5 %   Platelets 213  150 - 400 K/uL   Neutrophils Relative % 55  43 - 77 %  Neutro Abs 3.0  1.7 - 7.7 K/uL   Lymphocytes Relative 33  12 - 46 %   Lymphs Abs 1.8  0.7 - 4.0 K/uL   Monocytes Relative 10  3 - 12 %   Monocytes Absolute 0.5  0.1 - 1.0 K/uL   Eosinophils Relative 2  0 - 5 %   Eosinophils Absolute 0.1  0.0 - 0.7 K/uL   Basophils Relative 1  0 - 1 %   Basophils Absolute 0.1  0.0 - 0.1 K/uL  COMPREHENSIVE METABOLIC PANEL      Result Value Range   Sodium 135  135 - 145 mEq/L   Potassium 4.2  3.5 - 5.1 mEq/L   Chloride 102  96 - 112 mEq/L   CO2 24  19 - 32 mEq/L   Glucose, Bld 95  70 - 99 mg/dL   BUN 12  6 - 23 mg/dL   Creatinine, Ser 1.61  0.50 - 1.35 mg/dL   Calcium 9.1  8.4 - 09.6 mg/dL   Total Protein 6.4  6.0 - 8.3 g/dL   Albumin 3.4 (*) 3.5 - 5.2 g/dL   AST 31  0 - 37 U/L   ALT 22  0 - 53 U/L   Alkaline Phosphatase 85  39 - 117 U/L   Total Bilirubin 0.4  0.3 - 1.2 mg/dL   GFR calc non Af Amer 86 (*) >90 mL/min   GFR calc Af Amer >90  >90 mL/min  URINE RAPID DRUG SCREEN  (HOSP PERFORMED)      Result Value Range   Opiates NONE DETECTED  NONE DETECTED   Cocaine NONE DETECTED  NONE DETECTED   Benzodiazepines NONE DETECTED  NONE DETECTED   Amphetamines NONE DETECTED  NONE DETECTED   Tetrahydrocannabinol NONE DETECTED  NONE DETECTED   Barbiturates NONE DETECTED  NONE DETECTED  ETHANOL      Result Value Range   Alcohol, Ethyl (B) <11  0 - 11 mg/dL  SALICYLATE LEVEL      Result Value Range   Salicylate Lvl <2.0 (*) 2.8 - 20.0 mg/dL  URINALYSIS, ROUTINE W REFLEX MICROSCOPIC      Result Value Range   Color, Urine YELLOW  YELLOW   APPearance CLEAR  CLEAR   Specific Gravity, Urine 1.015  1.005 - 1.030   pH 5.5  5.0 - 8.0   Glucose, UA NEGATIVE  NEGATIVE mg/dL   Hgb urine dipstick NEGATIVE  NEGATIVE   Bilirubin Urine NEGATIVE  NEGATIVE   Ketones, ur NEGATIVE  NEGATIVE mg/dL   Protein, ur NEGATIVE  NEGATIVE mg/dL   Urobilinogen, UA 0.2  0.0 - 1.0 mg/dL   Nitrite NEGATIVE  NEGATIVE   Leukocytes, UA NEGATIVE  NEGATIVE  COMPREHENSIVE METABOLIC PANEL      Result Value Range   Sodium 135  135 - 145 mEq/L   Potassium 4.1  3.5 - 5.1 mEq/L   Chloride 102  96 - 112 mEq/L   CO2 24  19 - 32 mEq/L   Glucose, Bld 111 (*) 70 - 99 mg/dL   BUN 11  6 - 23 mg/dL   Creatinine, Ser 0.45  0.50 - 1.35 mg/dL   Calcium 8.8  8.4 - 40.9 mg/dL   Total Protein 6.0  6.0 - 8.3 g/dL   Albumin 3.3 (*) 3.5 - 5.2 g/dL   AST 23  0 - 37 U/L   ALT 20  0 - 53 U/L   Alkaline Phosphatase 82  39 - 117 U/L  Total Bilirubin 0.4  0.3 - 1.2 mg/dL   GFR calc non Af Amer 86 (*) >90 mL/min   GFR calc Af Amer >90  >90 mL/min  MAGNESIUM      Result Value Range   Magnesium 1.9  1.5 - 2.5 mg/dL  PHOSPHORUS      Result Value Range   Phosphorus 3.9  2.3 - 4.6 mg/dL  CBC WITH DIFFERENTIAL      Result Value Range   WBC 6.7  4.0 - 10.5 K/uL   RBC 5.26  4.22 - 5.81 MIL/uL   Hemoglobin 14.7  13.0 - 17.0 g/dL   HCT 16.1  09.6 - 04.5 %   MCV 81.7  78.0 - 100.0 fL   MCH 27.9  26.0 - 34.0 pg    MCHC 34.2  30.0 - 36.0 g/dL   RDW 40.9  81.1 - 91.4 %   Platelets 199  150 - 400 K/uL   Neutrophils Relative % 73  43 - 77 %   Neutro Abs 4.8  1.7 - 7.7 K/uL   Lymphocytes Relative 19  12 - 46 %   Lymphs Abs 1.3  0.7 - 4.0 K/uL   Monocytes Relative 8  3 - 12 %   Monocytes Absolute 0.5  0.1 - 1.0 K/uL   Eosinophils Relative 1  0 - 5 %   Eosinophils Absolute 0.0  0.0 - 0.7 K/uL   Basophils Relative 0  0 - 1 %   Basophils Absolute 0.0  0.0 - 0.1 K/uL  APTT      Result Value Range   aPTT 31  24 - 37 seconds  PROTIME-INR      Result Value Range   Prothrombin Time 14.2  11.6 - 15.2 seconds   INR 1.12  0.00 - 1.49  COMPREHENSIVE METABOLIC PANEL      Result Value Range   Sodium 135  135 - 145 mEq/L   Potassium 4.0  3.5 - 5.1 mEq/L   Chloride 102  96 - 112 mEq/L   CO2 25  19 - 32 mEq/L   Glucose, Bld 119 (*) 70 - 99 mg/dL   BUN 11  6 - 23 mg/dL   Creatinine, Ser 7.82  0.50 - 1.35 mg/dL   Calcium 9.0  8.4 - 95.6 mg/dL   Total Protein 6.1  6.0 - 8.3 g/dL   Albumin 3.4 (*) 3.5 - 5.2 g/dL   AST 23  0 - 37 U/L   ALT 18  0 - 53 U/L   Alkaline Phosphatase 81  39 - 117 U/L   Total Bilirubin 0.5  0.3 - 1.2 mg/dL   GFR calc non Af Amer 87 (*) >90 mL/min   GFR calc Af Amer >90  >90 mL/min  CBC      Result Value Range   WBC 8.9  4.0 - 10.5 K/uL   RBC 5.23  4.22 - 5.81 MIL/uL   Hemoglobin 14.8  13.0 - 17.0 g/dL   HCT 21.3  08.6 - 57.8 %   MCV 81.5  78.0 - 100.0 fL   MCH 28.3  26.0 - 34.0 pg   MCHC 34.7  30.0 - 36.0 g/dL   RDW 46.9  62.9 - 52.8 %   Platelets 201  150 - 400 K/uL  TSH      Result Value Range   TSH 0.777  0.350 - 4.500 uIU/mL  GLUCOSE, CAPILLARY      Result Value Range   Glucose-Capillary 101 (*) 70 -  99 mg/dL  GLUCOSE, CAPILLARY      Result Value Range   Glucose-Capillary 86  70 - 99 mg/dL  CK      Result Value Range   Total CK 2130 (*) 7 - 232 U/L  CBC      Result Value Range   WBC 9.4  4.0 - 10.5 K/uL   RBC 5.56  4.22 - 5.81 MIL/uL   Hemoglobin 15.8  13.0 -  17.0 g/dL   HCT 45.4  09.8 - 11.9 %   MCV 81.1  78.0 - 100.0 fL   MCH 28.4  26.0 - 34.0 pg   MCHC 35.0  30.0 - 36.0 g/dL   RDW 14.7  82.9 - 56.2 %   Platelets 211  150 - 400 K/uL  BASIC METABOLIC PANEL      Result Value Range   Sodium 138  135 - 145 mEq/L   Potassium 3.7  3.5 - 5.1 mEq/L   Chloride 102  96 - 112 mEq/L   CO2 25  19 - 32 mEq/L   Glucose, Bld 109 (*) 70 - 99 mg/dL   BUN 8  6 - 23 mg/dL   Creatinine, Ser 1.30  0.50 - 1.35 mg/dL   Calcium 9.2  8.4 - 86.5 mg/dL   GFR calc non Af Amer 84 (*) >90 mL/min   GFR calc Af Amer >90  >90 mL/min  GLUCOSE, CAPILLARY      Result Value Range   Glucose-Capillary 117 (*) 70 - 99 mg/dL   Comment 1 Notify RN    POCT I-STAT, CHEM 8      Result Value Range   Sodium 136  135 - 145 mEq/L   Potassium 5.4 (*) 3.5 - 5.1 mEq/L   Chloride 106  96 - 112 mEq/L   BUN 16  6 - 23 mg/dL   Creatinine, Ser 7.84  0.50 - 1.35 mg/dL   Glucose, Bld 95  70 - 99 mg/dL   Calcium, Ion 6.96 (*) 1.13 - 1.30 mmol/L   TCO2 25  0 - 100 mmol/L   Hemoglobin 16.0  13.0 - 17.0 g/dL   HCT 29.5  28.4 - 13.2 %   A/P: Pt with acute encephalopathy of uncertain etiology. Tent plan is for MRI brain with IV conscious sedation today. Consent for procedure signed by pt's spouse.

## 2013-04-26 NOTE — ED Notes (Signed)
Spoke with Dr. Chestine Spore about pt; pt wheezing and sats hanging in 88-92% range on 4L. Pt has sleep apnea and Asthma. Pt still having periods of combativeness and severe agitation after Fentanyl given and 8mg  Versed. Will discontinue sedation now per MD and let ordering MD know. Will transfer pr back to [facility].

## 2013-04-26 NOTE — Progress Notes (Signed)
PT Cancellation Note  Patient Details Name: Sean Hill MRN: [id number] DOB: 16-Aug-1941   Cancelled Treatment:    Reason Eval/Treat Not Completed: Patient's level of consciousness;Medical issues which prohibited therapy [facility].  Pt currently agitated   Donnetta Hail 04/26/2013, 10:32 AM

## 2013-04-27 ENCOUNTER — Other Ambulatory Visit (HOSPITAL_COMMUNITY): Payer: Self-pay

## 2013-04-27 LAB — CK: Total CK: 1604 U/L — ABNORMAL HIGH (ref 7–232)

## 2013-04-27 MED ORDER — HYDRALAZINE HCL 20 MG/ML IJ SOLN: 5.0000 mg | Freq: Four times a day (QID) | INTRAMUSCULAR | Status: DC | PRN

## 2013-04-27 MED ORDER — LORAZEPAM 2 MG/ML IJ SOLN
2.0000 mg | Freq: Once | INTRAMUSCULAR | Status: AC
  Administered 2013-04-27: 2 mg via INTRAVENOUS
  Filled 2013-04-27: qty 1

## 2013-04-27 MED ORDER — HALOPERIDOL LACTATE 5 MG/ML IJ SOLN: 5.0000 mg | Freq: Once | INTRAMUSCULAR | Status: DC

## 2013-04-27 MED ORDER — HALOPERIDOL LACTATE 5 MG/ML IJ SOLN
5.0000 mg | Freq: Four times a day (QID) | INTRAMUSCULAR | Status: DC | PRN
  Administered 2013-04-27 – 2013-04-29 (×5): 5 mg via INTRAVENOUS
  Filled 2013-04-27 (×5): qty 1

## 2013-04-27 MED ORDER — HALOPERIDOL LACTATE 5 MG/ML IJ SOLN
5.0000 mg | Freq: Once | INTRAMUSCULAR | Status: AC
Start: 2013-04-27 — End: 2013-04-27
  Administered 2013-04-27: 5 mg via INTRAVENOUS

## 2013-04-27 NOTE — Progress Notes (Signed)
Patient ID: Sean Hill, male   DOB: 05/12/1941, 72 y.o.   MRN: [id number]  Patient is under sedation and unable to participate in psych assessment, and patient family is at bed side. Await for his ability to participate in psych assessment.    Nehemiah Settle., MD 04/27/2013 3:07 PM

## 2013-04-27 NOTE — Progress Notes (Signed)
[facility] PROGRESS NOTE Interim History: 72 year old male with past medical history of bipolar disorder, depression, GERD who was brought to [facility] ED due to altered mental status. Per family (not present at this time at the bedside) patient has taken Klonopin and Prilosec, multiple tablets and was lethargic and unresponsive. History is not provided by the patient due to altered mental status. It was a 90 bottle pill  and 32 pills were left on arrival to the ED.   Assessment/Plan:  Acute encephalopathy: - continues to be agitated not responding to commands. ? Delirium. - EKG QTc cont to be unchange. - MRI with conscious sedation not able to perform - Unclear etiology. - Has not taken any of his medications. - d/c morphine, trazadone as this can cause delirium. - change fluids to d 0.45%. Ativan and haldol as needed.   Bipolar disorder - pending, pt not cooperating.  - psyq consulted.  Code Status: full Family Communication: wife  Disposition Plan: inapteint   Consultants:  psy  Procedures:  CT head  Antibiotics:  None  HPI/Subjective: Restless, nonverbal.  Objective: Filed Vitals:   04/27/13 0400 04/27/13 0500 04/27/13 0600 04/27/13 0700  BP:   157/79   Pulse: 89 96 91 95  Temp:   98.7 F (37.1 C)   TempSrc:   Axillary   Resp: 20 17 18 16   Height:      Weight:  109.7 kg (241 lb 13.5 oz)    SpO2: 90% 96% 91% 96%    Intake/Output Summary (Last 24 hours) at 04/27/13 0737 Last data filed at 04/27/13 0600  Gross per 24 hour  Intake   2925 ml  Output    955 ml  Net   1970 ml   Filed Weights   04/23/13 2115 04/26/13 0400 04/27/13 0500  Weight: 115.8 kg (255 lb 4.7 oz) 109.8 kg (242 lb 1 oz) 109.7 kg (241 lb 13.5 oz)    Exam:  General: able , in no acute distress.  HEENT: No bruits, no goiter.  Heart: Regular rate and rhythm, without murmurs, rubs, gallops.  Lungs: Good air movement, bilateral air movement.  Abdomen: Soft, nontender, nondistended,  positive bowel sounds.  Neuro: resposive to voice but not able to answer question, responds with mumbles.    Data Reviewed: Basic Metabolic Panel:  Recent Labs Lab 04/23/13 1830 04/23/13 1846 04/23/13 2240 04/24/13 0332 04/26/13 0530  NA 135 136 135 135 138  K 4.2 5.4* 4.1 4.0 3.7  CL 102 106 102 102 102  CO2 24  --  24 25 25   GLUCOSE 95 95 111* 119* 109*  BUN 12 16 11 11 8   CREATININE 0.86 0.90 0.85 0.83 0.90  CALCIUM 9.1  --  8.8 9.0 9.2  MG  --   --  1.9  --   --   PHOS  --   --  3.9  --   --    Liver Function Tests:  Recent Labs Lab 04/23/13 1830 04/23/13 2240 04/24/13 0332  AST 31 23 23   ALT 22 20 18   ALKPHOS 85 82 81  BILITOT 0.4 0.4 0.5  PROT 6.4 6.0 6.1  ALBUMIN 3.4* 3.3* 3.4*   No results found for this basename: LIPASE, AMYLASE,  in the last 168 hours No results found for this basename: AMMONIA,  in the last 168 hours CBC:  Recent Labs Lab 04/23/13 1830 04/23/13 1846 04/23/13 2240 04/24/13 0332 04/26/13 0530  WBC 5.5  --  6.7 8.9 9.4  NEUTROABS 3.0  --  4.8  --   --   HGB 15.6 16.0 14.7 14.8 15.8  HCT 44.8 47.0 43.0 42.6 45.1  MCV 81.3  --  81.7 81.5 81.1  PLT 213  --  199 201 211   Cardiac Enzymes:  Recent Labs Lab 04/25/13 1355  CKTOTAL 2130*   BNP (last 3 results) No results found for this basename: PROBNP,  in the last 8760 hours CBG:  Recent Labs Lab 04/24/13 0759 04/25/13 0755 04/26/13 0846  GLUCAP 101* 86 117*    Recent Results (from the past 240 hour(s))  MRSA PCR SCREENING     Status: Abnormal   Collection Time    04/23/13 10:04 PM      Result Value Range Status   MRSA by PCR POSITIVE (*) NEGATIVE Final   Comment:            The GeneXpert MRSA Assay (FDA     approved for NASAL specimens     only), is one component of a     comprehensive MRSA colonization     surveillance program. It is not     intended to diagnose MRSA     infection nor to guide or     monitor treatment for     MRSA infections.     RESULT  CALLED TO, READ BACK BY AND VERIFIED WITH:     PARKER,A RN @ 2328 ON 08.05.2014 BY MCREYNOLDS,B     Studies: No results found.  Scheduled Meds: . antiseptic oral rinse  15 mL Mouth Rinse q12n4p  . chlorhexidine  15 mL Mouth Rinse BID  . Chlorhexidine Gluconate Cloth  6 each Topical Q0600  . lurasidone  40 mg Oral QHS,MR X 1  . mupirocin ointment  1 application Nasal BID  . oxymetazoline  2 spray Each Nare BID  . sodium chloride  3 mL Intravenous Q12H   Continuous Infusions: . dextrose 5 % and 0.45% NaCl 125 mL/hr at 04/27/13 0304     Marinda Elk  [facility] Pager [phone]. If 8PM-8AM, please contact night-coverage at [email or web address], password [facility] 04/27/2013, 7:37 AM  LOS: 4 days

## 2013-04-27 NOTE — Consult Note (Signed)
NEURO HOSPITALIST CONSULT NOTE    Reason for Consult: altered mental status.  HPI:                                                                                                                                          Sean Hill is an 72 y.o. male with a past medical history significant for bipolar disorder, multiple suicide attempts , asthma, sleep apnea, hyperlipidemia, nephrolithiasis, GERD, admitted to [facility] due to altered mental status. Review of his hospital records indicate that he took multiple tablets of klonopin and Prilosec which resulted on alteration of his mental status and respiratory failure with hypoxemia. He was treated with IV narcan upon arrival to the ED.  CT brain on admission showed no acute abnormality He lethargic and at times agitated and thus MRI brain was requested. Unfortunately, MRI couldn't be completed due to patient's agitation. Family and nurse at the bedside report that he has been doing better this afternoon and has been able to recognize family.    Past Medical History  Diagnosis Date  . Bipolar disorder     Has psychiatrist.  BH admission (suicidal) 07/20/11.  Marland Kitchen Hyperlipemia     Intol of meds: GI side effects  . 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  No hx of MRSA infection.  . 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 Nephrolithiasis   . GERD (gastroesophageal reflux disease)   . Inguinal hernia 02/25/11    Left sided hernia with fat  . Hip pain, left 2011-2012    Left hip and groin: MRI pelvis and left hip 02/18/11 showed NORMAL hip, with small inguinal hernia containing fat and sigmoid diverticulosis.  Hip pain presumably referred pain from hernia/diverticular dz??.  . Cervical spondylosis     Primarily C4-5 and C5-6--referred to [facility] 02/2011.  . Depression   . Bipolar disorder, current episode depressed, moderate   . Sleep apnea   .  Asthma     Past Surgical History  Procedure Laterality Date  . 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 Ankle fracture surgery      hardware still in both ankles (titanium)  . Inguinal hernia repair      Right side with mesh about 1993  . Elbow surgery      left---tendon  . Ankle surgery      Ankle tendon surgery  . Tonsillectomy    . Appendectomy      Family History  Problem Relation Age of Onset  . Alcohol abuse Mother   . Alcohol abuse Father   . Depression Daughter   . Paranoid behavior Daughter     Social  History:  reports that he has never smoked. He does not have any smokeless tobacco history on file. He reports that he does not drink alcohol or use illicit drugs.  Allergies  Allergen Reactions  . Codeine   . Penicillins Hives and Other (See Comments)    Watery blisters    MEDICATIONS:                                                                                                                     I have reviewed the patient's current medications.   ROS:  Unable to obtain due to mental status.                                                                                                                    History obtained from chart review and family       Physical exam: sedated, no apparent distress. Blood pressure 123/73, pulse 77, temperature 98.4 F (36.9 C), temperature source Oral, resp. rate 14, height 6' (1.829 m), weight 109.7 kg (241 lb 13.5 oz), SpO2 98.00%. Head: normocephalic. Neck: supple, no bruits, no JVD. Cardiac: no murmurs. Lungs: clear. Abdomen: soft, no tender, no mass. Extremities: no edema.    Neurologic Examination:                                                                                                      Mental Status: Lethargic but open eyes and is able to follow simple commands.  Speech fluent without evidence of aphasia.   Cranial Nerves: II: Discs flat bilaterally; Visual fields grossly  normal, pupils equal, round, reactive to light and accommodation III,IV, VI: ptosis not present, extra-ocular motions intact bilaterally V,VII: smile symmetric, facial light touch sensation normal bilaterally VIII: hearing normal bilaterally IX,X: gag reflex present XI: bilateral shoulder shrug XII: midline tongue extension Motor: Right : Upper extremity   5/5    Left:     Upper extremity   5/5  Lower extremity   5/5     Lower extremity   5/5 Tone and bulk:normal tone throughout; no atrophy noted  Sensory: Pinprick and light touch intact throughout, bilaterally Deep Tendon Reflexes:  1+ all over Plantars: Right: downgoing   Left: downgoing Cerebellar: Unable to test. Gait:  Unable to test.  CV: pulses palpable throughout  No meningeal signs.   Lab Results  Component Value Date/Time   CHOL 234* 02/14/2012  9:55 AM    Results for orders placed during the hospital encounter of 04/23/13 (from the past 48 hour(s))  CBC     Status: None   Collection Time    04/26/13  5:30 AM      Result Value Range   WBC 9.4  4.0 - 10.5 K/uL   Comment: WHITE COUNT CONFIRMED ON SMEAR   RBC 5.56  4.22 - 5.81 MIL/uL   Hemoglobin 15.8  13.0 - 17.0 g/dL   HCT 78.2  95.6 - 21.3 %   MCV 81.1  78.0 - 100.0 fL   MCH 28.4  26.0 - 34.0 pg   MCHC 35.0  30.0 - 36.0 g/dL   RDW 08.6  57.8 - 46.9 %   Platelets 211  150 - 400 K/uL  BASIC METABOLIC PANEL     Status: Abnormal   Collection Time    04/26/13  5:30 AM      Result Value Range   Sodium 138  135 - 145 mEq/L   Potassium 3.7  3.5 - 5.1 mEq/L   Chloride 102  96 - 112 mEq/L   CO2 25  19 - 32 mEq/L   Glucose, Bld 109 (*) 70 - 99 mg/dL   BUN 8  6 - 23 mg/dL   Creatinine, Ser 6.29  0.50 - 1.35 mg/dL   Calcium 9.2  8.4 - 52.8 mg/dL   GFR calc non Af Amer 84 (*) >90 mL/min   GFR calc Af Amer >90  >90 mL/min   Comment:            The eGFR has been calculated     using the CKD EPI equation.     This calculation has not been     validated in all  clinical     situations.     eGFR's persistently     <90 mL/min signify     possible Chronic Kidney Disease.  GLUCOSE, CAPILLARY     Status: Abnormal   Collection Time    04/26/13  8:46 AM      Result Value Range   Glucose-Capillary 117 (*) 70 - 99 mg/dL   Comment 1 Notify RN    CK     Status: Abnormal   Collection Time    04/27/13  8:41 AM      Result Value Range   Total CK 1604 (*) 7 - 232 U/L    No results found.   Assessment/Plan: 72 y/o with persistent alteration of mental status status post recent drug overdose with clonazepam and Prilosec. Neuro-exam is non focal and is reported as more alert and able to briefly interact with family. He told me " thanks Dr" at the end of my assessment. I believe patient has a toxic encephalopathy which appears to be gradually improving. Will reassess tomorrow and decide whether or not about further neurological testing necessary at this moment. Wyatt Portela ,MD [facility] [phone]  04/27/2013, 4:38 PM

## 2013-04-27 NOTE — Progress Notes (Addendum)
Patient observed being cooperative. Improving in cognition and following commands. Removed restraints . Patient has sitter at bedside

## 2013-04-28 MED ORDER — TRAZODONE HCL 100 MG PO TABS
100.0000 mg | ORAL_TABLET | Freq: Every evening | ORAL | Status: DC | PRN
  Administered 2013-04-28 – 2013-04-29 (×2): 100 mg via ORAL
  Filled 2013-04-28 (×2): qty 1

## 2013-04-28 MED ORDER — SALINE SPRAY 0.65 % NA SOLN
1.0000 | NASAL | Status: DC | PRN
  Filled 2013-04-28: qty 44

## 2013-04-28 NOTE — Progress Notes (Signed)
NEURO HOSPITALIST PROGRESS NOTE   SUBJECTIVE:                                                                                                                        Sleeping a lot but less agitated/restless and able to interact with family and staff. No other neurological developments.  OBJECTIVE:                                                                                                                           Vital signs in last 24 hours: Temp:  [97.4 F (36.3 C)-98.7 F (37.1 C)] 98.2 F (36.8 C) (08/10 0811) Pulse Rate:  [61-87] 80 (08/10 1000) Resp:  [14-20] 14 (08/10 1000) BP: (110-136)/(49-76) 117/55 mmHg (08/10 0805) SpO2:  [92 %-98 %] 97 % (08/10 1000)  Intake/Output from previous day: 08/09 0701 - 08/10 0700 In: 3876.3 [P.O.:720; I.V.:3056.3] Out: 2195 [Urine:2195] Intake/Output this shift: Total I/O In: 865 [P.O.:240; I.V.:625] Out: 1250 [Urine:1250] Nutritional status: General  Past Medical History  Diagnosis Date  . Bipolar disorder     Has psychiatrist.  BH admission (suicidal) 07/20/11.  Marland Kitchen Hyperlipemia     Intol of meds: GI side effects  . 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  No hx of MRSA infection.  . 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 Nephrolithiasis   . GERD (gastroesophageal reflux disease)   . Inguinal hernia 02/25/11    Left sided hernia with fat  . Hip pain, left 2011-2012    Left hip and groin: MRI pelvis and left hip 02/18/11 showed NORMAL hip, with small inguinal hernia containing fat and sigmoid diverticulosis.  Hip pain presumably referred pain from hernia/diverticular dz??.  . Cervical spondylosis     Primarily C4-5 and C5-6--referred to [facility] 02/2011.  . Depression   . Bipolar disorder, current episode depressed, moderate   . Sleep apnea   . Asthma     Neurologic Exam:  Mental status: Open eyes and is  able to follow simple commands. Oriented to year-month. Knows president's name. Speech fluent without evidence of aphasia.  Cranial Nerves:  II: Discs flat bilaterally; Visual fields grossly  normal, pupils equal, round, reactive to light and accommodation  III,IV, VI: ptosis not present, extra-ocular motions intact bilaterally  V,VII: smile symmetric, facial light touch sensation normal bilaterally  VIII: hearing normal bilaterally  IX,X: gag reflex present  XI: bilateral shoulder shrug  XII: midline tongue extension  Motor:  Right : Upper extremity 5/5 Left: Upper extremity 5/5  Lower extremity 5/5 Lower extremity 5/5  Tone and bulk:normal tone throughout; no atrophy noted  Sensory: Pinprick and light touch intact throughout, bilaterally   Lab Results: Lab Results  Component Value Date/Time   CHOL 234* 02/14/2012  9:55 AM   Lipid Panel No results found for this basename: CHOL, TRIG, HDL, CHOLHDL, VLDL, LDLCALC,  in the last 72 hours  Studies/Results: No results found.  MEDICATIONS                                                                                                                       I have reviewed the patient's current medications.  ASSESSMENT/PLAN:                                                                                                           Toxic encephalopathy, resolving. Will defer neurological testing at this moment. Will continue to follow. Ermalene Postin  [facility] [phone]  04/28/2013, 12:37 PM

## 2013-04-28 NOTE — Progress Notes (Addendum)
[facility] PROGRESS NOTE Interim History: 72 year old male with past medical history of bipolar disorder, depression, GERD who was brought to [facility] ED due to altered mental status. Per family (not present at this time at the bedside) patient has taken Klonopin and Prilosec, multiple tablets and was lethargic and unresponsive. History is not provided by the patient due to altered mental status. It was a 90 bottle pill  and 32 pills were left on arrival to the ED.   Assessment/Plan:  Acute encephalopathy: -  Following commands alert and oriented x2 -  MRI, cont haldol prn. - consulted neurology rec resume oral meds. - KVO IV fluids advance diet.  Suicide attempt: - re-consutl psyq. - pt depressive  Bipolar disorder - pt cooperating.  - psyq consulted.  Code Status: full Family Communication: wife  Disposition Plan: inapteint   Consultants:  psy  Procedures:  CT head  Antibiotics:  None  HPI/Subjective: Alert no complains. Is hungry.  Objective: Filed Vitals:   04/27/13 1900 04/27/13 2023 04/27/13 2348 04/28/13 0341  BP:  120/76 136/59 119/58  Pulse: 85 75 76 72  Temp:  98.2 F (36.8 C) 98.7 F (37.1 C) 97.4 F (36.3 C)  TempSrc:  Axillary Oral Oral  Resp: 20 16 18 14   Height:      Weight:      SpO2: 95% 92% 96% 92%    Intake/Output Summary (Last 24 hours) at 04/28/13 0759 Last data filed at 04/28/13 1610  Gross per 24 hour  Intake 3751.25 ml  Output   2195 ml  Net 1556.25 ml   Filed Weights   04/23/13 2115 04/26/13 0400 04/27/13 0500  Weight: 115.8 kg (255 lb 4.7 oz) 109.8 kg (242 lb 1 oz) 109.7 kg (241 lb 13.5 oz)    Exam:  General: A&O x2 in no acute distress.  HEENT: No bruits, no goiter.  Heart: Regular rate and rhythm, without murmurs, rubs, gallops.  Lungs: Good air movement, bilateral air movement.  Abdomen: Soft, nontender, nondistended, positive bowel sounds.  Neuro: able to follow commands, no focal. appropriate.    Data  Reviewed: Basic Metabolic Panel:  Recent Labs Lab 04/23/13 1830 04/23/13 1846 04/23/13 2240 04/24/13 0332 04/26/13 0530  NA 135 136 135 135 138  K 4.2 5.4* 4.1 4.0 3.7  CL 102 106 102 102 102  CO2 24  --  24 25 25   GLUCOSE 95 95 111* 119* 109*  BUN 12 16 11 11 8   CREATININE 0.86 0.90 0.85 0.83 0.90  CALCIUM 9.1  --  8.8 9.0 9.2  MG  --   --  1.9  --   --   PHOS  --   --  3.9  --   --    Liver Function Tests:  Recent Labs Lab 04/23/13 1830 04/23/13 2240 04/24/13 0332  AST 31 23 23   ALT 22 20 18   ALKPHOS 85 82 81  BILITOT 0.4 0.4 0.5  PROT 6.4 6.0 6.1  ALBUMIN 3.4* 3.3* 3.4*   No results found for this basename: LIPASE, AMYLASE,  in the last 168 hours No results found for this basename: AMMONIA,  in the last 168 hours CBC:  Recent Labs Lab 04/23/13 1830 04/23/13 1846 04/23/13 2240 04/24/13 0332 04/26/13 0530  WBC 5.5  --  6.7 8.9 9.4  NEUTROABS 3.0  --  4.8  --   --   HGB 15.6 16.0 14.7 14.8 15.8  HCT 44.8 47.0 43.0 42.6 45.1  MCV 81.3  --  81.7 81.5 81.1  PLT 213  --  199 201 211   Cardiac Enzymes:  Recent Labs Lab 04/25/13 1355 04/27/13 0841  CKTOTAL 2130* 1604*   BNP (last 3 results) No results found for this basename: PROBNP,  in the last 8760 hours CBG:  Recent Labs Lab 04/24/13 0759 04/25/13 0755 04/26/13 0846  GLUCAP 101* 86 117*    Recent Results (from the past 240 hour(s))  MRSA PCR SCREENING     Status: Abnormal   Collection Time    04/23/13 10:04 PM      Result Value Range Status   MRSA by PCR POSITIVE (*) NEGATIVE Final   Comment:            The GeneXpert MRSA Assay (FDA     approved for NASAL specimens     only), is one component of a     comprehensive MRSA colonization     surveillance program. It is not     intended to diagnose MRSA     infection nor to guide or     monitor treatment for     MRSA infections.     RESULT CALLED TO, READ BACK BY AND VERIFIED WITH:     PARKER,A RN @ 2328 ON 08.05.2014 BY MCREYNOLDS,B      Studies: No results found.  Scheduled Meds: . antiseptic oral rinse  15 mL Mouth Rinse q12n4p  . chlorhexidine  15 mL Mouth Rinse BID  . Chlorhexidine Gluconate Cloth  6 each Topical Q0600  . mupirocin ointment  1 application Nasal BID  . sodium chloride  3 mL Intravenous Q12H   Continuous Infusions: . dextrose 5 % and 0.45% NaCl 125 mL/hr at 04/28/13 0002     Marinda Elk  [facility] Pager [phone]. If 8PM-8AM, please contact night-coverage at [email or web address], password [facility] 04/28/2013, 7:59 AM  LOS: 5 days

## 2013-04-29 LAB — GLUCOSE, CAPILLARY
Glucose-Capillary: 114 mg/dL — ABNORMAL HIGH (ref 70–99)
Glucose-Capillary: 117 mg/dL — ABNORMAL HIGH (ref 70–99)
Glucose-Capillary: 96 mg/dL (ref 70–99)

## 2013-04-29 MED ORDER — LORAZEPAM 2 MG/ML IJ SOLN: 1.0000 mg | Freq: Once | INTRAMUSCULAR | Status: DC

## 2013-04-29 MED ORDER — DIPHENHYDRAMINE HCL 50 MG/ML IJ SOLN
25.0000 mg | Freq: Once | INTRAMUSCULAR | Status: AC
  Administered 2013-04-29: 25 mg via INTRAVENOUS
  Filled 2013-04-29: qty 1

## 2013-04-29 NOTE — Progress Notes (Signed)
NP paged this NP secondary to pt wanting to go home. He was admitted with OD of Klonopin and secondary to sedation, psych has been unable to see him this visit. This NP to bedside to discuss with pt. Mr. Hoopes expresses concern that he is "not being helped" and having "his time wasted" just "sitting here." I discussed the situation of care surrounding a suicide attempt, and after that, he has agreed to stay on his own. Will inform attending at 7am about pt's concerns. Hopefully, psych can see early am and some decision can be made about him needing inpt vs outpt psych care.  Jimmye Norman, NP [facility]

## 2013-04-29 NOTE — Progress Notes (Addendum)
[facility] PROGRESS NOTE Interim History: 72 year old male with past medical history of bipolar disorder, depression, GERD who was brought to [facility] ED due to altered mental status. Per family (not present at this time at the bedside) patient has taken Klonopin and Prilosec, multiple tablets and was lethargic and unresponsive. History is not provided by the patient due to altered mental status. It was a 90 bottle pill  and 32 pills were left on arrival to the ED.   Assessment/Plan:  Acute encephalopathy: -  Following commands alert and oriented x3 -  Re-consult psyq for SI. - consulted neurology rec resume oral meds. - KVO IV fluids advance diet. - not agitate or aggressive. - admitted to Suicide attempt.  Suicide attempt: - re-consutl psyq. - pt depressive  Bipolar disorder - pt cooperating.  - psyq consulted.  Code Status: full Family Communication: wife  Disposition Plan: inapteint   Consultants:  psy  Procedures:  CT head  Antibiotics:  None  HPI/Subjective: Alert no complains.  Objective: Filed Vitals:   04/28/13 1255 04/28/13 1441 04/28/13 1522 04/28/13 2217  BP: 117/60  116/58 129/70  Pulse: 79  80 74  Temp:  97.7 F (36.5 C) 98.2 F (36.8 C) 98.1 F (36.7 C)  TempSrc:  Oral Oral Oral  Resp: 17  20 18   Height:   5' 10.5" (1.791 m)   Weight:   108.863 kg (240 lb)   SpO2: 95%  96% 96%    Intake/Output Summary (Last 24 hours) at 04/29/13 1046 Last data filed at 04/28/13 2100  Gross per 24 hour  Intake   1460 ml  Output   2025 ml  Net   -565 ml   Filed Weights   04/26/13 0400 04/27/13 0500 04/28/13 1522  Weight: 109.8 kg (242 lb 1 oz) 109.7 kg (241 lb 13.5 oz) 108.863 kg (240 lb)    Exam:  General: A&O x2 in no acute distress.  HEENT: No bruits, no goiter.  Heart: Regular rate and rhythm, without murmurs, rubs, gallops.  Lungs: Good air movement, bilateral air movement.  Abdomen: Soft, nontender, nondistended, positive bowel sounds.   Neuro: able to follow commands, no focal. appropriate.    Data Reviewed: Basic Metabolic Panel:  Recent Labs Lab 04/23/13 1830 04/23/13 1846 04/23/13 2240 04/24/13 0332 04/26/13 0530  NA 135 136 135 135 138  K 4.2 5.4* 4.1 4.0 3.7  CL 102 106 102 102 102  CO2 24  --  24 25 25   GLUCOSE 95 95 111* 119* 109*  BUN 12 16 11 11 8   CREATININE 0.86 0.90 0.85 0.83 0.90  CALCIUM 9.1  --  8.8 9.0 9.2  MG  --   --  1.9  --   --   PHOS  --   --  3.9  --   --    Liver Function Tests:  Recent Labs Lab 04/23/13 1830 04/23/13 2240 04/24/13 0332  AST 31 23 23   ALT 22 20 18   ALKPHOS 85 82 81  BILITOT 0.4 0.4 0.5  PROT 6.4 6.0 6.1  ALBUMIN 3.4* 3.3* 3.4*   No results found for this basename: LIPASE, AMYLASE,  in the last 168 hours No results found for this basename: AMMONIA,  in the last 168 hours CBC:  Recent Labs Lab 04/23/13 1830 04/23/13 1846 04/23/13 2240 04/24/13 0332 04/26/13 0530  WBC 5.5  --  6.7 8.9 9.4  NEUTROABS 3.0  --  4.8  --   --   HGB  15.6 16.0 14.7 14.8 15.8  HCT 44.8 47.0 43.0 42.6 45.1  MCV 81.3  --  81.7 81.5 81.1  PLT 213  --  199 201 211   Cardiac Enzymes:  Recent Labs Lab 04/25/13 1355 04/27/13 0841  CKTOTAL 2130* 1604*   BNP (last 3 results) No results found for this basename: PROBNP,  in the last 8760 hours CBG:  Recent Labs Lab 04/25/13 0755 04/26/13 0846 04/27/13 0831 04/28/13 0735 04/29/13 0751  GLUCAP 86 117* 114* 117* 96    Recent Results (from the past 240 hour(s))  MRSA PCR SCREENING     Status: Abnormal   Collection Time    04/23/13 10:04 PM      Result Value Range Status   MRSA by PCR POSITIVE (*) NEGATIVE Final   Comment:            The GeneXpert MRSA Assay (FDA     approved for NASAL specimens     only), is one component of a     comprehensive MRSA colonization     surveillance program. It is not     intended to diagnose MRSA     infection nor to guide or     monitor treatment for     MRSA infections.      RESULT CALLED TO, READ BACK BY AND VERIFIED WITH:     PARKER,A RN @ 2328 ON 08.05.2014 BY MCREYNOLDS,B     Studies: No results found.  Scheduled Meds: . antiseptic oral rinse  15 mL Mouth Rinse q12n4p  . chlorhexidine  15 mL Mouth Rinse BID  . sodium chloride  3 mL Intravenous Q12H   Continuous Infusions: . dextrose 5 % and 0.45% NaCl 125 mL/hr at 04/28/13 1706     FELIZ Rosine Beat  [facility] Pager [phone]. If 8PM-8AM, please contact night-coverage at [email or web address], password [facility] 04/29/2013, 10:46 AM  LOS: 6 days

## 2013-04-29 NOTE — Progress Notes (Signed)
NEURO HOSPITALIST PROGRESS NOTE   SUBJECTIVE:                                                                                                                        Patient is alert and oriented, calm, following all commands. HE is concerned about long term SE due to ingesting the pills.   OBJECTIVE:                                                                                                                           Vital signs in last 24 hours: Temp:  [97.7 F (36.5 C)-98.2 F (36.8 C)] 98.1 F (36.7 C) (08/10 2217) Pulse Rate:  [74-80] 74 (08/10 2217) Resp:  [17-20] 18 (08/10 2217) BP: (116-129)/(58-70) 129/70 mmHg (08/10 2217) SpO2:  [95 %-96 %] 96 % (08/10 2217) Weight:  [108.863 kg (240 lb)] 108.863 kg (240 lb) (08/10 1522)  Intake/Output from previous day: 08/10 0701 - 08/11 0700 In: 2075 [P.O.:1200; I.V.:875] Out: 2725 [Urine:2725] Intake/Output this shift:   Nutritional status: General  Past Medical History  Diagnosis Date  . Bipolar disorder     Has psychiatrist.  BH admission (suicidal) 07/20/11.  Marland Kitchen Hyperlipemia     Intol of meds: GI side effects  . 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  No hx of MRSA infection.  . 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 Nephrolithiasis   . GERD (gastroesophageal reflux disease)   . Inguinal hernia 02/25/11    Left sided hernia with fat  . Hip pain, left 2011-2012    Left hip and groin: MRI pelvis and left hip 02/18/11 showed NORMAL hip, with small inguinal hernia containing fat and sigmoid diverticulosis.  Hip pain presumably referred pain from hernia/diverticular dz??.  . Cervical spondylosis     Primarily C4-5 and C5-6--referred to [facility] 02/2011.  . Depression   . Bipolar disorder, current episode depressed, moderate   . Sleep apnea   . Asthma      Neurologic Exam:  Mental Status: Alert, oriented,  thought content appropriate.  Speech fluent without evidence of aphasia.  Able to follow 3 step commands without difficulty. Cranial  Nerves: II: Discs flat bilaterally; Visual fields grossly normal, pupils equal, round, reactive to light and accommodation III,IV, VI: ptosis not present, extra-ocular motions intact bilaterally V,VII: smile symmetric, facial light touch sensation normal bilaterally VIII: hearing normal bilaterally IX,X: gag reflex present XI: bilateral shoulder shrug XII: midline tongue extension Motor: Right : Upper extremity   5/5    Left:     Upper extremity   5/5  Lower extremity   5/5     Lower extremity   5/5 Tone and bulk:normal tone throughout; no atrophy noted Sensory: Pinprick and light touch intact throughout, bilaterally Deep Tendon Reflexes:  Right: Upper Extremity   Left: Upper extremity   biceps (C-5 to C-6) 2/4   biceps (C-5 to C-6) 2/4 tricep (C7) 2/4    triceps (C7) 2/4 Brachioradialis (C6) 2/4  Brachioradialis (C6) 2/4  Lower Extremity Lower Extremity  quadriceps (L-2 to L-4) 1/4   quadriceps (L-2 to L-4) 1/4 Achilles (S1) 1/4   Achilles (S1) 1/4  Plantars: Right: downgoing   Left: downgoing Cerebellar: normal finger-to-nose,  normal heel-to-shin test CV: pulses palpable throughout    Lab Results: Lab Results  Component Value Date/Time   CHOL 234* 02/14/2012  9:55 AM   Lipid Panel No results found for this basename: CHOL, TRIG, HDL, CHOLHDL, VLDL, LDLCALC,  in the last 72 hours  Studies/Results: No results found.  MEDICATIONS                                                                                                                        Scheduled: . antiseptic oral rinse  15 mL Mouth Rinse q12n4p  . chlorhexidine  15 mL Mouth Rinse BID  . sodium chloride  3 mL Intravenous Q12H    ASSESSMENT/PLAN:                                                                                                            Toxic encephalopathy,  resolved. No further neurologic diagnostic testing recommended at this time. Neurology will S/O --please call with any questions.   Assessment and plan discussed with with attending physician and they are in agreement.    Felicie Morn PA-C [facility] [phone]  04/29/2013, 11:32 AM

## 2013-04-29 NOTE — Progress Notes (Signed)
Paged NP on call due to pt having concerns about not receiving treatment while in the hospital. Pt was requesting to sign himself out, but explained to pt that I would not feel safe allowing him to do that. Discussed with Donnamarie Poag, NP, that pt was on suicide precautions but had no IVC papers. Notified pt's wife of the situation to see if she could help talk him into wanting to stay. Then Donnamarie Poag, NP, arrived to the floor to talk to the patient.

## 2013-04-29 NOTE — Evaluation (Signed)
Physical Therapy Evaluation Patient Details Name: Sean Hill MRN: [id number] DOB: 06/22/1941 Today's Date: 04/29/2013 Time: 4098-1191 PT Time Calculation (min): 16 min  PT Assessment / Plan / Recommendation History of Present Illness  72 year old male with past medical history of bipolar disorder, depression, GERD who was brought to [facility] ED due to altered mental status. Per family (not present at this time at the bedside) patient has taken Klonopin and Prilosec, multiple tablets and was lethargic and unresponsive. History is not provided by the  patient due to altered mental status. In ED patient was given narcan and his mental status somewhat improved.   Clinical Impression  On eval, pt required Min guard assist for mobility-able to ambulate ~175 feet while pushing IV pole. Anticipate pt's mobility should continue to improve. Recommend HHPT vs no follow up, depending on progress. Will follow.     PT Assessment  Patient needs continued PT services    Follow Up Recommendations  Home health PT;No PT follow up    Does the patient have the potential to tolerate intense rehabilitation      Barriers to Discharge        Equipment Recommendations  None recommended by PT    Recommendations for Other Services     Frequency Min 3X/week    Precautions / Restrictions Precautions Precautions: Fall Restrictions Weight Bearing Restrictions: No   Pertinent Vitals/Pain No c/o pain      Mobility  Bed Mobility Bed Mobility: Supine to Sit;Sit to Supine Supine to Sit: 6: Modified independent (Device/Increase time) Sit to Supine: 6: Modified independent (Device/Increase time) Transfers Transfers: Sit to Stand;Stand to Sit Sit to Stand: 5: Supervision;From bed Stand to Sit: 5: Supervision;To bed Ambulation/Gait Ambulation/Gait Assistance: 4: Min guard Ambulation Distance (Feet): 175 Feet Ambulation/Gait Assistance Details: Pt pushed IV pole. Slow gait speed. Slightly unsteady.  Gait  Pattern: Decreased step length - right;Decreased step length - left;Decreased stride length    Exercises     PT Diagnosis: Difficulty walking;Generalized weakness  PT Problem List: Decreased strength;Decreased activity tolerance;Decreased mobility;Decreased balance;Decreased knowledge of use of DME PT Treatment Interventions: DME instruction;Gait training;Functional mobility training;Therapeutic activities;Therapeutic exercise;Patient/family education     PT Goals(Current goals can be found in the care plan section) Acute Rehab PT Goals Patient Stated Goal: home PT Goal Formulation: With patient Time For Goal Achievement: 05/13/13 Potential to Achieve Goals: Good  Visit Information  Last PT Received On: 04/29/13 Assistance Needed: +1 History of Present Illness: 72 year old male with past medical history of bipolar disorder, depression, GERD who was brought to [facility] [place] ED due to altered mental status. Per family (not present at this time at the bedside) patient has taken Klonopin and Prilosec, multiple tablets and was lethargic and unresponsive. History is not provided by the  patient due to altered mental status. In ED patient was given narcan and his mental status somewhat improved.        Prior Functioning  Home Living Family/patient expects to be discharged to:: Private residence Living Arrangements: Spouse/significant other Available Help at Discharge: Family Type of Home:  (townhome) Home Access: Stairs to enter Secretary/administrator of Steps: 2 Entrance Stairs-Rails: None Home Layout: One level Home Equipment: Environmental consultant - 2 wheels;Cane - single point Prior Function Level of Independence: Independent Communication Communication: No difficulties    Cognition  Cognition Arousal/Alertness: Awake/alert Behavior During Therapy: WFL for tasks assessed/performed Overall Cognitive Status: Within Functional Limits for tasks assessed    Extremity/Trunk Assessment Upper Extremity  Assessment  Upper Extremity Assessment: Generalized weakness Lower Extremity Assessment Lower Extremity Assessment: Generalized weakness Cervical / Trunk Assessment Cervical / Trunk Assessment: Normal   Balance    End of Session PT - End of Session Activity Tolerance: Patient tolerated treatment well Patient left: in bed;with call bell/phone within reach;with nursing/sitter in room  GP     Rebeca Alert, MPT Pager: [phone]

## 2013-04-29 NOTE — Progress Notes (Signed)
Clinical Social Work Department CLINICAL SOCIAL WORK PSYCHIATRY SERVICE LINE ASSESSMENT 04/29/2013  Patient:  Sean Hill  Account:  [id number]  Admit Date:  04/23/2013  Clinical Social Worker:  Unk Lightning, LCSW  Date/Time:  04/29/2013 10:30 AM Referred by:  Physician  Date referred:  04/29/2013 Reason for Referral  Psychosocial assessment   Presenting Symptoms/Problems (In the person's/family's own words):   Psych consulted due to patient being admitted after overdosing on medications.   Abuse/Neglect/Trauma History (check all that apply)  Denies history   Abuse/Neglect/Trauma Comments:   Psychiatric History (check all that apply)  Outpatient treatment  Inpatient/hospitilization   Psychiatric medications:  Klonopin 1 mg  Latuda 40 mg   Current Mental Health Hospitalizations/Previous Mental Health History:   Patient reports he was diagnosed with bipolar disorder when he was 62 years. Patient believes he has had symptoms of bipolar all his life. Patient currently receiving medication management and outpatient therapy.   Current provider:   [employer] [facility] and Date:   [place], [place]   Current Medications:   acetaminophen, acetaminophen, albuterol, haloperidol lactate, hydrALAZINE, ondansetron (ZOFRAN) IV, ondansetron, sodium chloride, traZODone            . antiseptic oral rinse  15 mL Mouth Rinse q12n4p  . chlorhexidine  15 mL Mouth Rinse BID  . sodium chloride  3 mL Intravenous Q12H   Previous Impatient Admission/Date/Reason:   Patient reports he has been at [facility]. Patient most recent hospital stay was at [facility] in March 2014. Patient is requesting inpatient stay at [facility].   Emotional Health / Current Symptoms    Suicide/Self Harm  Suicide attempt in past (date/description)   Suicide attempt in the past:   Patient was admitted after overdosing on around 180 Klonopin pills. Patient reports this is his third or fourth suicide attempt. Patient denies any  current SI or HI. Patient reports he is impulsive and overdosed due to increased stress with recent move.   Other harmful behavior:   None reported   Psychotic/Dissociative Symptoms  None reported   Other Psychotic/Dissociative Symptoms:    Attention/Behavioral Symptoms  Within Normal Limits   Other Attention / Behavioral Symptoms:   Patient engaged throughout assessment.    Cognitive Impairment  Orientation - Place  Orientation - Self  Orientation - Situation  Orientation - Time   Other Cognitive Impairment:   Patient alert and oriented throughout assessment.    Mood and Adjustment  Flat    Stress, Anxiety, Trauma, Any Recent Loss/Stressor  Other - See comment   Anxiety (frequency):   Patient reports he feels anxious at times but it could be related to manic symptoms.   Phobia (specify):   N/A   Compulsive behavior (specify):   N/A   Obsessive behavior (specify):   N/A   Other:   Patient and wife recently moved into a new home. Patient was against the move and is struggling with adjusting to new environment.   Substance Abuse/Use  Current substance use   SBIRT completed (please refer for detailed history):  Y  Self-reported substance use:   Patient reports he drinks 2-3 beers a night. Patient denies any further substance use. Patient does not feel that alcohol affects his life and does not feel he requires treatment for substance use.   Urinary Drug Screen Completed:  Y Alcohol level:   <11    Environmental/Housing/Living Arrangement  Stable housing   Who is in the home:   Wife   Emergency contact:  Cherrie-wife   Financial  Medicare   Patient's Strengths and Goals (patient's own words):   Patient reports that he is involved with treatment and agreeable to treatment at [facility].   Clinical Social Worker's Interpretive Summary:   CSW received referral to complete psychosocial assessment. CSW reviewed chart and met with patient at bedside. CSW  introduced myself and explained role. No visitors were present during assessment.    Patient reports he was admitted after overdosing on Klonopin. Patient had gotten his prescription filled for a 3 month supply and reports he took all of the pills. Patient denies taking any other medications.    Patient reports that he was diagnosed with bipolar about 10 years ago but feels he has suffered from symptoms throughout his entire life. Patient reports that he experiences manic phases at times but most recently has been feeling depressed. Patient's wife wanted to move into a new home and patient agreed but now regrets that decision and is having a difficult time adjusting to their new home. Patient has been married for 45 years and has 2 children and 2 grandchildren. Patient reports good relationships with all family members.    Patient has attempted suicide in the past as well. Patient was recently admitted to [facility] in March 2014 and then followed up with intensive outpatient. Patient currently receives medication management and outpatient therapy but feels he needs more supportive treatment at this time. Patient is requesting a short term stay at [facility] [address] and then to follow up with intensive outpatient again.    Patient regrets suicide attempt and reports he was feeling impulsive. Patient currently denies any SI or HI or psychotic symptoms. CSW and patient discussed wife's involvement with treatment and CSW suggested that wife assist with creating a safe environment due to patient's impulsive nature. Patient reports that wife has already locked up his guns and any weapons. Patient is agreeable to wife managing his medications to avoid any future attempts.    Patient was alert and oriented throughout assessment. Patient had appropriate eye contact with a flat affect. Patient is agreeable for wife to be involved with treatment. CSW called and left a message for wife. Patient is agreeable to treatment and is  goal-directed at this time. Patient is concerned about medical concerns due to amount of pills he consumed.    CSW explained that psych MD would assess patient and CSW would assist with disposition. CSW will continue to follow.   Disposition:  Recommend Psych CSW continuing to support while in hospital

## 2013-04-30 ENCOUNTER — Inpatient Hospital Stay (HOSPITAL_COMMUNITY)
Admission: AD | Admit: 2013-04-30 | Discharge: 2013-05-06 | DRG: 885 | Disposition: A | Discharge status: Home / Self Care | Payer: No Typology Code available for payment source | Source: Intra-hospital | Attending: Psychiatry | Admitting: Psychiatry

## 2013-04-30 ENCOUNTER — Ambulatory Visit (HOSPITAL_COMMUNITY): Payer: Self-pay | Admitting: Psychiatry

## 2013-04-30 ENCOUNTER — Encounter (HOSPITAL_COMMUNITY): Payer: Self-pay | Admitting: *Deleted

## 2013-04-30 ENCOUNTER — Telehealth (HOSPITAL_COMMUNITY): Payer: Self-pay | Admitting: *Deleted

## 2013-04-30 ENCOUNTER — Encounter (HOSPITAL_COMMUNITY): Payer: Self-pay

## 2013-04-30 DIAGNOSIS — G9340 Encephalopathy, unspecified: Secondary | ICD-10-CM

## 2013-04-30 DIAGNOSIS — Z79899 Other long term (current) drug therapy: Secondary | ICD-10-CM

## 2013-04-30 DIAGNOSIS — J9691 Respiratory failure, unspecified with hypoxia: Secondary | ICD-10-CM

## 2013-04-30 DIAGNOSIS — F3160 Bipolar disorder, current episode mixed, unspecified: Principal | ICD-10-CM | POA: Diagnosis present

## 2013-04-30 DIAGNOSIS — T50901A Poisoning by unspecified drugs, medicaments and biological substances, accidental (unintentional), initial encounter: Secondary | ICD-10-CM

## 2013-04-30 DIAGNOSIS — Z5189 Encounter for other specified aftercare: Secondary | ICD-10-CM

## 2013-04-30 DIAGNOSIS — F319 Bipolar disorder, unspecified: Secondary | ICD-10-CM

## 2013-04-30 DIAGNOSIS — F411 Generalized anxiety disorder: Secondary | ICD-10-CM | POA: Diagnosis present

## 2013-04-30 DIAGNOSIS — F603 Borderline personality disorder: Secondary | ICD-10-CM | POA: Diagnosis present

## 2013-04-30 DIAGNOSIS — K219 Gastro-esophageal reflux disease without esophagitis: Secondary | ICD-10-CM

## 2013-04-30 MED ORDER — ACETAMINOPHEN 325 MG PO TABS
650.0000 mg | ORAL_TABLET | Freq: Four times a day (QID) | ORAL | Status: DC | PRN
  Administered 2013-05-01 – 2013-05-04 (×2): 650 mg via ORAL

## 2013-04-30 MED ORDER — LURASIDONE HCL 40 MG PO TABS
40.0000 mg | ORAL_TABLET | Freq: Every evening | ORAL | Status: DC | PRN
  Administered 2013-04-30: 40 mg via ORAL
  Filled 2013-04-30 (×7): qty 1

## 2013-04-30 MED ORDER — PANTOPRAZOLE SODIUM 40 MG PO TBEC
40.0000 mg | DELAYED_RELEASE_TABLET | Freq: Every day | ORAL | Status: DC
  Administered 2013-05-01 – 2013-05-06 (×6): 40 mg via ORAL
  Filled 2013-04-30 (×7): qty 1

## 2013-04-30 MED ORDER — TRAZODONE HCL 100 MG PO TABS
100.0000 mg | ORAL_TABLET | Freq: Every evening | ORAL | Status: DC | PRN
  Administered 2013-04-30 – 2013-05-05 (×6): 100 mg via ORAL
  Filled 2013-04-30 (×2): qty 1
  Filled 2013-04-30: qty 7
  Filled 2013-04-30 (×4): qty 1

## 2013-04-30 MED ORDER — MAGNESIUM HYDROXIDE 400 MG/5ML PO SUSP: 30.0000 mL | Freq: Every day | ORAL | Status: DC | PRN

## 2013-04-30 MED ORDER — ALUM & MAG HYDROXIDE-SIMETH 200-200-20 MG/5ML PO SUSP
30.0000 mL | ORAL | Status: DC | PRN
  Administered 2013-05-01 (×3): 30 mL via ORAL

## 2013-04-30 MED ORDER — OXYMETAZOLINE HCL 0.05 % NA SOLN: 2.0000 | Freq: Two times a day (BID) | NASAL | Status: DC | PRN

## 2013-04-30 MED ORDER — SALINE SPRAY 0.65 % NA SOLN
1.0000 | NASAL | Status: DC | PRN
  Administered 2013-05-03: 1 via NASAL
  Filled 2013-04-30: qty 44

## 2013-04-30 NOTE — Progress Notes (Signed)
Patient appeared calm, received his HS medications without difficulty. Denied SI/HI and denied hallucinations. Q 15 minute check continues as ordered to maintain safety.

## 2013-04-30 NOTE — BH Assessment (Signed)
[facility] Assessment Progress Note  Per Thurman Coyer, RN, Administrative Coordinator, pt has been seen in consult by Leata Mouse, MD, who has accepted pt to [facility] to his own service.  Pt assigned to Rm 500-1.  Minerva Areola spoke to pt's nurse at 17:38 to notify her, asking that she have pt sign Voluntary Admission and Consent for Treatment.  Doylene Canning, MA Triage Specialist 04/30/2013 @ 17:42

## 2013-04-30 NOTE — Progress Notes (Signed)
[facility] PROGRESS NOTE  Assessment/Plan: Acute encephalopathy: -Following commands alert and oriented x3 -waiting psyq rec's. -KVO IV fluids advance diet. -not agitation or aggressive behavior. -admitted 2/2 Suicide attempt; stable and currently no SI and hallucinations. Will benefit of inpatient, but will follow psych rec's.  Suicide attempt: -pt depressive; no SI thoughts currently -waiting psych evaluation and recommendations  Bipolar disorder -pt cooperating and willing to go inpatient if needed for treatment. -no active SI or hallucinations -psyq consulted; waiting recommendations.  Code Status: full Family Communication: wife  Disposition Plan: medically stable waiting psych evaluation for discharge   Consultants:  psych  Procedures:  CT head  Antibiotics:  None  HPI/Subjective: AAOx3; no complaints. afebrile  Objective: Filed Vitals:   04/29/13 2250 04/30/13 0110 04/30/13 0631 04/30/13 1349  BP: 106/56  124/78 118/72  Pulse: 69  85 68  Temp: 98.1 F (36.7 C)  97.9 F (36.6 C) 98 F (36.7 C)  TempSrc: Oral  Oral Oral  Resp: 16  16 18   Height:      Weight:  108 kg (238 lb 1.6 oz)    SpO2: 95%  95% 97%    Intake/Output Summary (Last 24 hours) at 04/30/13 1456 Last data filed at 04/30/13 1200  Gross per 24 hour  Intake   4360 ml  Output      0 ml  Net   4360 ml   Filed Weights   04/27/13 0500 04/28/13 1522 04/30/13 0110  Weight: 109.7 kg (241 lb 13.5 oz) 108.863 kg (240 lb) 108 kg (238 lb 1.6 oz)    Exam:  General: A&O x3; in no acute distress.  HEENT: No bruits, no goiter.  Heart: Regular rate and rhythm, without murmurs, rubs, gallops.  Lungs: Good air movement, bilaterally; no complaints of SOB.  Abdomen: Soft, nontender, nondistended, positive bowel sounds.  Neuro: able to follow commands, no focal deficit.   Data Reviewed: Basic Metabolic Panel:  Recent Labs Lab 04/23/13 1830 04/23/13 1846 04/23/13 2240  04/24/13 0332 04/26/13 0530  NA 135 136 135 135 138  K 4.2 5.4* 4.1 4.0 3.7  CL 102 106 102 102 102  CO2 24  --  24 25 25   GLUCOSE 95 95 111* 119* 109*  BUN 12 16 11 11 8   CREATININE 0.86 0.90 0.85 0.83 0.90  CALCIUM 9.1  --  8.8 9.0 9.2  MG  --   --  1.9  --   --   PHOS  --   --  3.9  --   --    Liver Function Tests:  Recent Labs Lab 04/23/13 1830 04/23/13 2240 04/24/13 0332  AST 31 23 23   ALT 22 20 18   ALKPHOS 85 82 81  BILITOT 0.4 0.4 0.5  PROT 6.4 6.0 6.1  ALBUMIN 3.4* 3.3* 3.4*   CBC:  Recent Labs Lab 04/23/13 1830 04/23/13 1846 04/23/13 2240 04/24/13 0332 04/26/13 0530  WBC 5.5  --  6.7 8.9 9.4  NEUTROABS 3.0  --  4.8  --   --   HGB 15.6 16.0 14.7 14.8 15.8  HCT 44.8 47.0 43.0 42.6 45.1  MCV 81.3  --  81.7 81.5 81.1  PLT 213  --  199 201 211   Cardiac Enzymes:  Recent Labs Lab 04/25/13 1355 04/27/13 0841  CKTOTAL 2130* 1604*   CBG:  Recent Labs Lab 04/26/13 0846 04/27/13 0831 04/28/13 0735 04/29/13 0751 04/30/13 0759  GLUCAP 117* 114* 117* 96 101*    Recent Results (from the  past 240 hour(s))  MRSA PCR SCREENING     Status: Abnormal   Collection Time    04/23/13 10:04 PM      Result Value Range Status   MRSA by PCR POSITIVE (*) NEGATIVE Final   Comment:            The GeneXpert MRSA Assay (FDA     approved for NASAL specimens     only), is one component of a     comprehensive MRSA colonization     surveillance program. It is not     intended to diagnose MRSA     infection nor to guide or     monitor treatment for     MRSA infections.     RESULT CALLED TO, READ BACK BY AND VERIFIED WITH:     PARKER,A RN @ 2328 ON 08.05.2014 BY MCREYNOLDS,B     Studies: No results found.  Scheduled Meds: . antiseptic oral rinse  15 mL Mouth Rinse q12n4p  . chlorhexidine  15 mL Mouth Rinse BID  . sodium chloride  3 mL Intravenous Q12H   Continuous Infusions:     Sharlyn Odonnel  [facility] Pager [phone]. If 8PM-8AM, please  contact night-coverage at [email or web address], password [facility] 04/30/2013, 2:56 PM  LOS: 7 days

## 2013-04-30 NOTE — BH Assessment (Addendum)
Assessment Note  Sean Hill is a 72 y.o. married white male.  Pt was admitted to [facility] on 04/23/2013 following an overdose on a large quantity of Klonopin, somewhere between 44 and 180 mg, with acknowledged suicidal intent.  On 04/30/2013 pt was seen in consult by Sean Mouse, MD.  Per his consult note:  "HPI: Patient is seen and chart reviewed. Patient stated that has been suffering with the separation and divorce and has been feeding suicide and chronically. Patient 7 wife is at bedside who provided information for this evaluation. Patient has been diagnosed with bipolar disorder and has been treated at [facility] psychiatric counseling [facility] with medication management and his medication was recently changed to [place] about 4 days ago. Patient has been depressed over few weeks and repeatedly makes statements like "I cannot eat like this" in the he also has multiple previous suicide attempts with overdose on his medications and self injurious behaviors. Patient has at least 4 or 582 psychiatric hospitalizations at [facility] and his last admission was March 2014.Patient wife stated that he was feeling his Klonopin about 2 weeks ago for 90 pills and they are 32 previous left after he was overdose on Klonopin this 1 mg. Reportedly he is supposed to take one pill at bedtime. Roughly he was taken 44 mg of Klonopin as overdose. His current medications are Latuda 40 mg a day, Klonopin 1 mg at bedtime for anxiety and trazodone at bedtime for sleep. Patient also has individual therapist Sean Hill who has been seeing for some time.  "Mental Status Examination: Patient is awake, alert and oriented to time place person and situation. He has fair eye contact. Patient has depressed mood and his affect was constricted. He has normal rate, rhythm, and low volume of speech. His thought process is linear and goal directed. Patient has denied suicidal, homicidal ideations, intentions  or plans. Patient has no evidence of auditory or visual hallucinations, delusions, and paranoia. Patient has fair insight judgment and impulse control.  "Social History: reports that he has never smoked. He does not have any smokeless tobacco history on file. He reports that he does not drink alcohol or use illicit drugs.  "Recommended acute psychiatric hospitalization for safety, crisis stabilization medication management.  "Patient has showed interest in participating in intense outpatient program after the initial stabilization."  On 04/29/2013 pt was assessed by Unk Lightning, LCSW.  Her interpretive summary reports the following:  "CSW received referral to complete psychosocial assessment. CSW reviewed chart and met with patient at bedside. CSW introduced myself and explained role. No visitors were present during assessment.     "Patient reports he was admitted after overdosing on Klonopin. Patient had gotten his prescription filled for a 3 month supply and reports he took all of the pills. Patient denies taking any other medications.     "Patient reports that he was diagnosed with bipolar about 10 years ago but feels he has suffered from symptoms throughout his entire life. Patient reports that he experiences manic phases at times but most recently has been feeling depressed. Patient's wife wanted to move into a new home and patient agreed but now regrets that decision and is having a difficult time adjusting to their new home. Patient has been married for 45 years and has 2 children and 2 grandchildren. Patient reports good relationships with all family members.     "Patient has attempted suicide in the past as well. Patient was recently admitted to [facility] in  March 2014 and then followed up with intensive outpatient. Patient currently receives medication management and outpatient therapy but feels he needs more supportive treatment at this time. Patient is requesting a short term stay at [facility] and  then to follow up with intensive outpatient again.     "Patient regrets suicide attempt and reports he was feeling impulsive. Patient currently denies any SI or HI or psychotic symptoms. CSW and patient discussed wife's involvement with treatment and CSW suggested that wife assist with creating a safe environment due to patient's impulsive nature. Patient reports that wife has already locked up his guns and any weapons. Patient is agreeable to wife managing his medications to avoid any future attempts.     "Patient was alert and oriented throughout assessment. Patient had appropriate eye contact with a flat affect. Patient is agreeable for wife to be involved with treatment. CSW called and left a message for wife. Patient is agreeable to treatment and is goal-directed at this time. Patient is concerned about medical concerns due to amount of pills he consumed.     "CSW explained that psych MD would assess patient and CSW would assist with disposition. CSW will continue to follow."      Axis I: Bipolar I Disorder, most recent episode mixed, severe, without psychotic features 296.63 Axis II: Deferred 799.9 Axis III:  Past Medical History  Diagnosis Date  . Bipolar disorder     Has psychiatrist.  BH admission (suicidal) 07/20/11.  Marland Kitchen Hyperlipemia     Intol of meds: GI side effects  . 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  No hx of MRSA infection.  . 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 Nephrolithiasis   . GERD (gastroesophageal reflux disease)   . Inguinal hernia 02/25/11    Left sided hernia with fat  . Hip pain, left 2011-2012    Left hip and groin: MRI pelvis and left hip 02/18/11 showed NORMAL hip, with small inguinal hernia containing fat and sigmoid diverticulosis.  Hip pain presumably referred pain from hernia/diverticular dz??.  . Cervical spondylosis     Primarily C4-5 and C5-6--referred to [facility]  02/2011.  . Depression   . Bipolar disorder, current episode depressed, moderate   . Sleep apnea   . Asthma   . Overdose of benzodiazepine 04/30/2013    Status post   Axis IV: housing problems, other psychosocial or environmental problems and problems with primary support group Axis V: GAF = 35  Past Medical History:  Past Medical History  Diagnosis Date  . Bipolar disorder     Has psychiatrist.  BH admission (suicidal) 07/20/11.  Marland Kitchen Hyperlipemia     Intol of meds: GI side effects  . 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  No hx of MRSA infection.  . 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 Nephrolithiasis   . GERD (gastroesophageal reflux disease)   . Inguinal hernia 02/25/11    Left sided hernia with fat  . Hip pain, left 2011-2012    Left hip and groin: MRI pelvis and left hip 02/18/11 showed NORMAL hip, with small inguinal hernia containing fat and sigmoid diverticulosis.  Hip pain presumably referred pain from hernia/diverticular dz??.  . Cervical spondylosis     Primarily C4-5 and C5-6--referred to [facility] 02/2011.  . Depression   . Bipolar disorder,  current episode depressed, moderate   . Sleep apnea   . Asthma   . Overdose of benzodiazepine 04/30/2013    Status post    Past Surgical History  Procedure Laterality Date  . 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 Ankle fracture surgery      hardware still in both ankles (titanium)  . Inguinal hernia repair      Right side with mesh about 1993  . Elbow surgery      left---tendon  . Ankle surgery      Ankle tendon surgery  . Tonsillectomy    . Appendectomy      Family History:  Family History  Problem Relation Age of Onset  . Alcohol abuse Mother   . Alcohol abuse Father   . Depression Daughter   . Paranoid behavior Daughter     Social History:  reports that he has never smoked. He does not have any smokeless  tobacco history on file. He reports that he does not drink alcohol or use illicit drugs.  Additional Social History:  Alcohol / Drug Use Pain Medications: Denies Prescriptions: Denies Over the Counter: Denies History of alcohol / drug use?: No history of alcohol / drug abuse Substance #1 Name of Substance 1: Alcohol 1 - Age of First Use: Unknown 1 - Amount (size/oz): 2 - 3 beers 1 - Frequency: Nightly 1 - Duration: Unknown 1 - Last Use / Amount: Unknown  CIWA:   COWS:    Allergies:  Allergies  Allergen Reactions  . Codeine   . Penicillins Hives and Other (See Comments)    Watery blisters    Home Medications:  (Not in a hospital admission)  OB/GYN Status:  No LMP for male patient.  General Assessment Data Location of Assessment: [facility] Services Is this a Tele or Face-to-Face Assessment?: Face-to-Face ("Telephone call" - medical floor patient) Is this an Initial Assessment or a Re-assessment for this encounter?: Initial Assessment Living Arrangements: Spouse/significant other Can pt return to current living arrangement?: Yes Admission Status: Voluntary Is patient capable of signing voluntary admission?: Yes Transfer from: Acute Hospital Referral Source: Medical Floor [facility])  Medical Screening Exam [facility] Walk-in ONLY) Medical Exam completed: No Reason for MSE not completed: Other: (Transfer from [facility] to [facility] for admission)  [facility] Crisis Care Plan Living Arrangements: Spouse/significant other Name of Psychiatrist: [name] @ [facility] Name of Therapist: Maxcine Ham @ [facility]  Education Status Is patient currently in school?: No  Risk to self Suicidal Ideation: Yes-Currently Present Suicidal Intent: Yes-Currently Present Is patient at risk for suicide?: Yes Suicidal Plan?: Yes-Currently Present Specify Current Suicidal Plan: Pt overdosed on 44 - 180 mg of Klonopin in a suicide  attempt Access to Means: Yes Specify Access to Suicidal Means: Prescription medications What has been your use of drugs/alcohol within the last 12 months?: Drinks 2 - 3 beers nightly Previous Attempts/Gestures: Yes How many times?: 4 (3 - 4 by overdose) Other Self Harm Risks: Impulsive act; pt is now remorseful Triggers for Past Attempts: Unknown Intentional Self Injurious Behavior:  (Unspecified self injurious behavior mentioned) Family Suicide History: No (Daughter: depression, paranoia) Recent stressful life event(s): Conflict (Comment) (Recent move to new home, opposed by pt.) Persecutory voices/beliefs?: No Depression: Yes Depression Symptoms:  (Unspecified) Substance abuse history and/or treatment for substance abuse?:  (2 - 3 beers/night) Suicide prevention information given to non-admitted patients: Not applicable  Risk to Others Homicidal  Ideation: No Thoughts of Harm to Others: No Current Homicidal Intent: No Current Homicidal Plan: No Access to Homicidal Means: No Identified Victim: None History of harm to others?: No Assessment of Violence: None Noted Violent Behavior Description: Calm/cooperative Does patient have access to weapons?: No (Guns have been locked up) Criminal Charges Pending?: No (None reported) Does patient have a court date: No  Psychosis Hallucinations: None noted Delusions: None noted  Mental Status Report Appear/Hygiene: Other (Comment) (Casual) Eye Contact: Good Motor Activity: Unable to assess Speech: Soft;Other (Comment) (Otherwise WNL) Level of Consciousness: Alert Mood: Depressed Affect: Other (Comment) (Constricted) Anxiety Level:  (Unspecified) Thought Processes: Coherent;Relevant Judgement: Unimpaired (Fair) Orientation: Person;Place;Time;Situation Obsessive Compulsive Thoughts/Behaviors: None  Cognitive Functioning Concentration: Normal Memory:  (Unspecified) IQ: Average Insight: Fair Impulse Control: Fair Appetite:  (Pt  has recently stated, "I cannot eat like this.") Weight Loss:  (Unknown) Weight Gain:  (Unknown) Sleep:  (Unknown) Total Hours of Sleep:  (Unknown) Vegetative Symptoms:  (Unknown)  ADLScreening [facility] Assessment Services) Patient's cognitive ability adequate to safely complete daily activities?:  (Unknown) Patient able to express need for assistance with ADLs?:  (Unknown) Independently performs ADLs?:  (Unknown)  Prior Inpatient Therapy Prior Inpatient Therapy: Yes Prior Therapy Dates: 11/2012: most recent admission to [facility] of 4 or 5 for depression & suicidality  Prior Outpatient Therapy Prior Outpatient Therapy: Yes Prior Therapy Dates: Current psychiatry: [name] @ [facility] Prior Therapy Facilty/Provider(s): 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 Reason for Treatment: Hx of MH-IOP at [facility]  ADL Screening (condition at time of admission) Patient's cognitive ability adequate to safely complete daily activities?:  (Unknown) Is the patient deaf or have difficulty hearing?: No Does the patient have difficulty seeing, even when wearing glasses/contacts?: No Does the patient have difficulty concentrating, remembering, or making decisions?: No Patient able to express need for assistance with ADLs?:  (Unknown) Does the patient have difficulty dressing or bathing?:  (Unknown) Independently performs ADLs?:  (Unknown) Communication: Independent Is this a change from baseline?:  (Unknown) Dressing (OT):  (Unknown) Is this a change from baseline?:  (Unknown) Grooming:  (Unknown) Is this a change from baseline?:  (Unknown) Feeding:  (Unknown) Is this a change from baseline?:  (Unknown) Bathing:  (Unknown) Is this a change from baseline?:  (Unknown) Toileting:  (Unknown) Is this a change from baseline?:  (Unknown) In/Out Bed:  (Unknown) Is this a change from baseline?:  (Unknown) Walks in Home: Independent (Asymetrical gait) Is this a change from baseline?:  (Unknown) Does  the patient have difficulty walking or climbing stairs?:  (Unknown) Weakness of Legs:  (Asymetrical, but stable gait) Weakness of Arms/Hands: None  Home Assistive Devices/Equipment Home Assistive Devices/Equipment: Eyeglasses    Abuse/Neglect Assessment (Assessment to be complete while patient is alone) Physical Abuse: Denies Verbal Abuse: Denies Sexual Abuse: Denies Exploitation of patient/patient's resources: Denies Self-Neglect: Denies     Merchant navy officer (For Healthcare) Advance Directive:  (Unknown) Pre-existing out of facility DNR order (yellow form or pink MOST form):  (Unknown) Nutrition Screen- [facility] Adult/WL/AP Patient's home diet:  (Unknown)  Additional Information 1:1 In Past 12 Months?: No CIRT Risk: No Elopement Risk: No Does patient have medical clearance?: Yes     Disposition:  Disposition Initial Assessment Completed for this Encounter: Yes Disposition of Patient: Inpatient treatment program Type of inpatient treatment program: Adult Per Thurman Coyer, RN, Administrative Coordinator, pt seen in consult by Dr Elsie Saas, who agrees to accept pt to [facility] to his own service, Rm. 500-1.  Minerva Areola spoke to [employer] nurse  and requested that she have pt sign Voluntary Admission and Consent for Treatment, which was subsequently completed.  On Site Evaluation by:   Reviewed with Physician:  Sean Mouse, MD  Raphael Gibney 04/30/2013 9:45 PM

## 2013-04-30 NOTE — Progress Notes (Signed)
Patient ID: Sean Hill, male   DOB: Mar 05, 1941, 72 y.o.   MRN: [id number]  Admission Note:  D:72 yr male who presents [facility] in no acute distress for the treatment of SI and Depression. Pt appears flat and depressed. Pt was calm and cooperative with admission process. Pt currently denies SI/HI/AVH/Pain. Pt states " I took 180 Klonopin" . Pt was in ICU and pt was given Narcan to revive. Pt has a Hx of MRSA, but it's Colonized Pt has Past medical Hx of Bipolar, Hyperlipidemia, MRSA Carrier (colonization), Nephrolithasis, GERD, L/R Knee surgery. L/R ankle fusion . Pt states the pressures of life just came down at one time and he just couldn't cope.  A: Skin was assessed and found to be clear of any abnormal marks ather than bruises on forearms, R-hand and Tattoos on back, R/L arm and R/L shins. POC and unit policies explained and understanding verbalized. Consents obtained. Food and fluids offered, and fluids accepted.   Pt had no additional questions or concerns.

## 2013-04-30 NOTE — Tx Team (Signed)
Initial Interdisciplinary Treatment Plan  PATIENT STRENGTHS: (choose at least two) General fund of knowledge Supportive family/friends  PATIENT STRESSORS: Health problems Medication change or noncompliance   PROBLEM LIST: Problem List/Patient Goals Date to be addressed Date deferred Reason deferred Estimated date of resolution  Depression 04/30/13     SI 04/30/13     Risk for suicide 04/30/13                                          DISCHARGE CRITERIA:  Improved stabilization in mood, thinking, and/or behavior  PRELIMINARY DISCHARGE PLAN: Attend aftercare/continuing care group Attend PHP/IOP Outpatient therapy  PATIENT/FAMIILY INVOLVEMENT: This treatment plan has been presented to and reviewed with the patient, GRAYTON LOBO.  The patient and family have been given the opportunity to ask questions and make suggestions.  Durrel, Mcnee A 04/30/2013, 10:16 PM

## 2013-04-30 NOTE — Discharge Summary (Signed)
Physician Discharge Summary  Sean Hill ZOX:[id number] DOB: 01-04-1941 DOA: 04/23/2013  PCP: Thomos Lemons, DO  Admit date: 04/23/2013 Discharge date: 04/30/2013  Time spent: >30 minutes  Discharge Diagnoses:  Principal Problem:   Acute encephalopathy Active Problems:   Bipolar disorder   Gastroesophageal reflux disease   Drug overdose   Respiratory failure with hypoxia Allergic rhinitis Insomnia  Discharge Condition: medically stable. Will discharge to [facility] when bed available for further management and mood stabilization.  Diet recommendation: heart healthy diet  Filed Weights   04/27/13 0500 04/28/13 1522 04/30/13 0110  Weight: 109.7 kg (241 lb 13.5 oz) 108.863 kg (240 lb) 108 kg (238 lb 1.6 oz)    History of present illness:  72 year old male with past medical history of bipolar disorder, depression, GERD who was brought to [facility] ED due to altered mental status. Per family (not present at this time at the bedside) patient has taken Klonopin and Prilosec, multiple tablets and was lethargic and unresponsive. History is not provided by the patient due to altered mental status. In ED patient was given narcan and his mental status somewhat improved. Vital signs were stable in ED, BP 116/66, HR 79 and RR 19-23. O2 saturation was 90% on room air. BMP and CBC were essentially unremarkable. Poison control was contacted by ED physician and recommendation was to observe for CNS and respiratory depression and obtain EKG.   Hospital Course:  Acute encephalopathy:  -secondary to klonopin overdose -Following commands alert and oriented x3  -Per psyq rec's will transfer to [facility] for further management and mood stabilization.  -not agitation, no hallucinations and no suicidal thoughts currently.   Suicide attempt:  -pt depressed and with flat affect; no SI thoughts currently  -will transfer to [facility] for further evaluation and treatment -offending agent has been discontinue  Bipolar disorder   -pt cooperating and willing to go inpatient if needed for treatment.  -no active SI or hallucinations  -psyq consulted and recommended to transfer patient to [facility] for inpatient treatment. -continue Latuda -offending agent has been discontinue  GERD: continue PPI  Insomnia:continue trazodone  Allergic rhinitis: continue PRN afrin for congestion.  Procedures: See below for x-ray reports  Consultations:  Psychiatry   Discharge Exam: Filed Vitals:   04/30/13 1349  BP: 118/72  Pulse: 68  Temp: 98 F (36.7 C)  Resp: 18    General: A&O x3; in no acute distress.  HEENT: No bruits, no goiter.  Heart: Regular rate and rhythm, without murmurs, rubs, gallops.  Lungs: Good air movement, bilaterally; no complaints of SOB.  Abdomen: Soft, nontender, nondistended, positive bowel sounds.  Neuro: able to follow commands, no focal deficit.    Discharge Instructions  Discharge Orders   Future Orders Complete By Expires     Discharge instructions  As directed     Comments:      Discharge to [facility] for further evaluation and treatment. Take medications as prescribed Follow a heart healthy diet        Medication List    STOP taking these medications       clonazePAM 1 MG tablet  Commonly known as:  KLONOPIN      TAKE these medications       LATUDA 40 MG Tabs tablet  Generic drug:  lurasidone  Take 40 mg by mouth at bedtime and may repeat dose one time if needed.     omeprazole 40 MG capsule  Commonly known as:  PRILOSEC  Take 1 capsule (  40 mg total) by mouth 2 (two) times daily.     oxymetazoline 0.05 % nasal spray  Commonly known as:  AFRIN  Place 2 sprays into the nose 2 (two) times daily as needed for congestion (use daily for up to 5 days; then rest for 3 days before using it again).     traZODone 100 MG tablet  Commonly known as:  DESYREL  Take 1 tablet (100 mg total) by mouth at bedtime as needed for sleep.       Allergies  Allergen Reactions  . Codeine    . Penicillins Hives and Other (See Comments)    Watery blisters      The results of significant diagnostics from this hospitalization (including imaging, microbiology, ancillary and laboratory) are listed below for reference.    Significant Diagnostic Studies: Ct Head Wo Contrast  04/24/2013   *RADIOLOGY REPORT*  Clinical Data: Encephalopathy.  Altered level of consciousness.  CT HEAD WITHOUT CONTRAST  Technique:  Contiguous axial images were obtained from the base of the skull through the vertex without contrast.  Comparison: None.  Findings: There is no acute intracranial hemorrhage, infarction, or mass lesion.  Brain parenchyma is normal.  The osseous structures are normal.  IMPRESSION: Normal exam.   Original Report Authenticated By: Francene Boyers, M.D.    Microbiology: Recent Results (from the past 240 hour(s))  MRSA PCR SCREENING     Status: Abnormal   Collection Time    04/23/13 10:04 PM      Result Value Range Status   MRSA by PCR POSITIVE (*) NEGATIVE Final   Comment:            The GeneXpert MRSA Assay (FDA     approved for NASAL specimens     only), is one component of a     comprehensive MRSA colonization     surveillance program. It is not     intended to diagnose MRSA     infection nor to guide or     monitor treatment for     MRSA infections.     RESULT CALLED TO, READ BACK BY AND VERIFIED WITH:     PARKER,A RN @ 2328 ON 08.05.2014 BY MCREYNOLDS,B     Labs: Basic Metabolic Panel:  Recent Labs Lab 04/23/13 1830 04/23/13 1846 04/23/13 2240 04/24/13 0332 04/26/13 0530  NA 135 136 135 135 138  K 4.2 5.4* 4.1 4.0 3.7  CL 102 106 102 102 102  CO2 24  --  24 25 25   GLUCOSE 95 95 111* 119* 109*  BUN 12 16 11 11 8   CREATININE 0.86 0.90 0.85 0.83 0.90  CALCIUM 9.1  --  8.8 9.0 9.2  MG  --   --  1.9  --   --   PHOS  --   --  3.9  --   --    Liver Function Tests:  Recent Labs Lab 04/23/13 1830 04/23/13 2240 04/24/13 0332  AST 31 23 23   ALT 22 20 18    ALKPHOS 85 82 81  BILITOT 0.4 0.4 0.5  PROT 6.4 6.0 6.1  ALBUMIN 3.4* 3.3* 3.4*   CBC:  Recent Labs Lab 04/23/13 1830 04/23/13 1846 04/23/13 2240 04/24/13 0332 04/26/13 0530  WBC 5.5  --  6.7 8.9 9.4  NEUTROABS 3.0  --  4.8  --   --   HGB 15.6 16.0 14.7 14.8 15.8  HCT 44.8 47.0 43.0 42.6 45.1  MCV 81.3  --  81.7 81.5  81.1  PLT 213  --  199 201 211   Cardiac Enzymes:  Recent Labs Lab 04/25/13 1355 04/27/13 0841  CKTOTAL 2130* 1604*   CBG:  Recent Labs Lab 04/26/13 0846 04/27/13 0831 04/28/13 0735 04/29/13 0751 04/30/13 0759  GLUCAP 117* 114* 117* 96 101*    Signed:  Sharmel Ballantine  [facility] 04/30/2013, 5:17 PM

## 2013-04-30 NOTE — Consult Note (Signed)
Reason for Consult: Bipolar disorder, status post suicide attempt with Klonopin intentionally and impulsively Referring Physician: Vassie Loll, MD    Sean Hill is an 72 y.o. male.  HPI: Patient is seen and chart reviewed. Patient stated that has been suffering with the separation and divorce and has been feeding suicide and chronically. Patient 5 wife is at bedside who provided information for this evaluation. Patient has been diagnosed with bipolar disorder and has been treated at [facility] with medication management and his medication was recently changed to [place] about 4 days ago. Patient has been depressed over few weeks and repeatedly makes statements like "I cannot eat like this" in the he also has multiple previous suicide attempts with overdose on his medications and self injurious behaviors. Patient has at least 4 or 582 psychiatric hospitalizations at [facility] and his last admission was March 2014.Patient wife stated that he was feeling his Klonopin about 2 weeks ago for 90 pills and they are 32 previous left after he was overdose on Klonopin this 1 mg. Reportedly he is supposed to take one pill at bedtime. Roughly he was taken 44 mg of Klonopin as overdose. His current medications are Latuda 40 mg a day, Klonopin 1 mg at bedtime for anxiety and trazodone at bedtime for sleep. Patient also has individual therapist [name] who has been seeing for some time.   Mental Status Examination: Patient is awake, alert and oriented to time place person and situation. He has fair eye contact. Patient has depressed mood and his affect was constricted. He has normal rate, rhythm, and low volume of speech. His thought process is linear and goal directed. Patient has denied suicidal, homicidal ideations, intentions or plans. Patient has no evidence of auditory or visual hallucinations, delusions, and paranoia. Patient has fair insight  judgment and impulse control.  Past Medical History  Diagnosis Date  . Bipolar disorder     Has psychiatrist.  BH admission (suicidal) 07/20/11.  Marland Kitchen Hyperlipemia     Intol of meds: GI side effects  . 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  No hx of MRSA infection.  . 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 Nephrolithiasis   . GERD (gastroesophageal reflux disease)   . Inguinal hernia 02/25/11    Left sided hernia with fat  . Hip pain, left 2011-2012    Left hip and groin: MRI pelvis and left hip 02/18/11 showed NORMAL hip, with small inguinal hernia containing fat and sigmoid diverticulosis.  Hip pain presumably referred pain from hernia/diverticular dz??.  . Cervical spondylosis     Primarily C4-5 and C5-6--referred to [facility] 02/2011.  . Depression   . Bipolar disorder, current episode depressed, moderate   . Sleep apnea   . Asthma     Past Surgical History  Procedure Laterality Date  . 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 Ankle fracture surgery      hardware still in both ankles (titanium)  . Inguinal hernia repair      Right side with mesh about 1993  . Elbow surgery      left---tendon  . Ankle surgery      Ankle tendon surgery  . Tonsillectomy    . Appendectomy      Family History  Problem Relation Age of Onset  . Alcohol abuse Mother   .  Alcohol abuse Father   . Depression Daughter   . Paranoid behavior Daughter     Social History:  reports that he has never smoked. He does not have any smokeless tobacco history on file. He reports that he does not drink alcohol or use illicit drugs.  Allergies:  Allergies  Allergen Reactions  . Codeine   . Penicillins Hives and Other (See Comments)    Watery blisters    Medications: I have reviewed the patient's current medications.  Results for orders placed during the hospital encounter of 04/23/13 (from the past 48  hour(s))  GLUCOSE, CAPILLARY     Status: None   Collection Time    04/29/13  7:51 AM      Result Value Range   Glucose-Capillary 96  70 - 99 mg/dL   Comment 1 Notify RN    GLUCOSE, CAPILLARY     Status: Abnormal   Collection Time    04/30/13  7:59 AM      Result Value Range   Glucose-Capillary 101 (*) 70 - 99 mg/dL    No results found.  Positive for bipolar, depression and Intensive suicide attempt by overdose on his medication Blood pressure 118/72, pulse 68, temperature 98 F (36.7 C), temperature source Oral, resp. rate 18, height 5' 10.5" (1.791 m), weight 108 kg (238 lb 1.6 oz), SpO2 97.00%.   Assessment/Plan: Bipolar disorder most recent episode is mixed S/P benzodiazepine overdose as a suicide attempt  Recommended acute psychiatric hospitalization for safety, crisis stabilization medication management Patient has showed interest in participating in intense outpatient program after the initial stabilization No medications recommended at this time The patient psychiatric consultation   Nehemiah Settle., MD 04/30/2013, 3:37 PM

## 2013-05-01 DIAGNOSIS — F3130 Bipolar disorder, current episode depressed, mild or moderate severity, unspecified: Secondary | ICD-10-CM

## 2013-05-01 MED ORDER — FAMOTIDINE 20 MG PO TABS
20.0000 mg | ORAL_TABLET | Freq: Two times a day (BID) | ORAL | Status: DC
  Administered 2013-05-01 – 2013-05-06 (×10): 20 mg via ORAL
  Filled 2013-05-01 (×13): qty 1

## 2013-05-01 MED ORDER — LURASIDONE HCL 40 MG PO TABS
40.0000 mg | ORAL_TABLET | Freq: Every day | ORAL | Status: DC
  Administered 2013-05-01 – 2013-05-02 (×2): 40 mg via ORAL
  Filled 2013-05-01 (×4): qty 1

## 2013-05-01 NOTE — BHH Group Notes (Signed)
[facility] LCSW Aftercare Discharge Planning Group Note   05/01/2013 8:45 AM  Participation Quality:  Alert and Appropriate   Mood/Affect:  Appropriate, Flat and Depressed  Depression Rating:  5  Anxiety Rating:  5  Thoughts of Suicide:  Pt denies SI/HI  Will you contract for safety?   Yes  Current AVH: Pt denies  Plan for Discharge/Comments:  Pt attended discharge planning group and actively participated in group.  CSW provided pt with today's workbook.  Pt states that he came to the hospital for a suicide attempt, in which he took 180 Klonipins and was in a coma for 5 days.  Pt names recent stressor as moving and not liking the change.  Pt lives with his wife in [place] and can return home there.  Pt states that he is seen by [name], NP at [facility] for medication management and Maxcine Ham at [facility] for therapy.  CSW confirmed pt's follow up appointments.  Pt is open to stepping down to IOP; CSW is waiting for an answer from outpatient if this is an option for pt.  No further needs voiced by pt at this time.    Transportation Means: Pt has access to transportation  Supports: No supports mentioned at this time  Reyes Ivan, LCSWA 05/01/2013 11:53 AM

## 2013-05-01 NOTE — BHH Counselor (Signed)
Adult Psychosocial Assessment Update Interdisciplinary Team  [facility] admissions[facility]discharges:  Admissions Discharges  Date: 12/04/12 Date: 12/10/12  Date: Date:  Date: Date:  Date: Date:  Date: Date:   Changes since the last Psychosocial Assessment (including adherence to outpatient mental health and/or substance abuse treatment, situational issues contributing to decompensation and/or relapse). Pt states that since last admission he has been dealing with marital issues and moving, which both were stressful for him.  Pt states that he does not do well with change.  Pt did complete IOP through [facility] since last admission and has been seeing Maxcine Ham and [name] for therapy and medication management regularly.             Discharge Plan 1. Will you be returning to the same living situation after discharge?   Yes: X Return home No:      If no, what is your plan?           2. Would you like a referral for services when you are discharged? Yes:  X  If yes, for what services? Pt will return to [facility] for medication management and therapy.  Pt is considering [facility] as well.  No:              Summary and Recommendations (to be completed by the evaluator) Patient is a 72 year old Caucasian Male with a diagnosis of Bipolar I Disorder, most recent episode mixed, severe, without psychotic features.  Patient lives in [place] with his wife.  Patient will benefit from crisis stabilization, medication evaluation, group therapy and psycho education in addition to case management for discharge planning.                         Signature:  Carmina Miller, 05/01/2013 8:21 AM

## 2013-05-01 NOTE — Progress Notes (Signed)
Security notified of pt's transfer to [facility]. Pt escorted by security and Blanchie Dessert, NT, to [facility] Rm [id number].

## 2013-05-01 NOTE — BHH Suicide Risk Assessment (Signed)
Suicide Risk Assessment  Admission Assessment     Nursing information obtained from:  Patient Demographic factors:  Male;Age 72 or older;Unemployed Current Mental Status:  NA Loss Factors:  Decline in physical health;Financial problems / change in socioeconomic status Historical Factors:  NA Risk Reduction Factors:  Living with another person, especially a relative;Positive therapeutic relationship  CLINICAL FACTORS:   Severe Anxiety and/or Agitation Bipolar Disorder:   Depressive phase Depression:   Anhedonia Hopelessness Impulsivity Insomnia Recent sense of peace/wellbeing Severe Personality Disorders:   Cluster B Chronic Pain Unstable or Poor Therapeutic Relationship Previous Psychiatric Diagnoses and Treatments Medical Diagnoses and Treatments/Surgeries  COGNITIVE FEATURES THAT CONTRIBUTE TO RISK:  Closed-mindedness Loss of executive function Polarized thinking Thought constriction (tunnel vision)    SUICIDE RISK:   Severe:  Frequent, intense, and enduring suicidal ideation, specific plan, no subjective intent, but some objective markers of intent (i.e., choice of lethal method), the method is accessible, some limited preparatory behavior, evidence of impaired self-control, severe dysphoria/symptomatology, multiple risk factors present, and few if any protective factors, particularly a lack of social support.  PLAN OF CARE: Admit voluntarily from [facility] for bipolar I disorder most recent episode depression and status post suicide and a patent but overdose on his medication Klonopin and Protonix. Patient needed acute psychiatric inpatient hospitalization for crisis stabilization, safety and secured therapeutic milieu and medication management.  I certify that inpatient services furnished can reasonably be expected to improve the patient's condition.   Nehemiah Settle., M.D. 05/01/2013, 12:17 PM

## 2013-05-01 NOTE — Progress Notes (Signed)
Adult Psychoeducational Group Note  Date:  05/01/2013 Time:  8:52 PM  Group Topic/Focus:  Building Self Esteem:   The Focus of this group is helping patients become aware of the effects of self-esteem on their lives, the things they and others do that enhance or undermine their self-esteem, seeing the relationship between their level of self-esteem and the choices they make and learning ways to enhance self-esteem. Wrap-Up Group:   The focus of this group is to help patients review their daily goal of treatment and discuss progress on daily workbooks.  Participation Level:  None  Participation Quality:  Resistant  Affect:  Defensive, Irritable and Resistant  Cognitive:  Appropriate  Insight: Lacking  Engagement in Group:  Resistant  Modes of Intervention:  Exploration  Additional Comments:  Pt did not participate in group. Pt was very negative during group.  Ivis Henneman, Randal Buba 05/01/2013, 8:52 PM

## 2013-05-01 NOTE — Progress Notes (Signed)
D: Patient in the dayroom on first approach.  Patient irritable and angry stating his stomach has been hurting and protonix is not enough.  Patient also states he is upset because he was not allowed to have his shirts his wife brought in on the unit due to the content on the shirt.  Patient states he is ready for discharge and states he is going to [facility] to get the help he needs.  Donell Sievert notified about stomach complaints and new orders received. Patient less irritable and angry after Pepcid was added tonight and given.  Patient denies SI/HI and denies AVH.   A: Staff to monitor Q 15 mins for safety.  Encouragement and support offered.  Scheduled medications administered per orders.  Trazodone administered prn for sleep. R: Patient remains safe on the unit.  Patient attended group tonight.  Patient visible on the unit and interacting with peers.  Patient taking administered medications.

## 2013-05-01 NOTE — H&P (Signed)
Psychiatric Admission Assessment Adult  Patient Identification:  Sean Hill Date of Evaluation:  05/01/2013 Chief Complaint:  BIPOLAR DISORDER History of Present Illness:Patient is admitted from [facility] medical floor for bipolar disorder, MRE is depression and suicidal attempt with large dose of tranquilizers (Klopnopin about 44 mg) which required few days of stay at medical ICU. Patient is known to this provider from psychiatric consultation services. Patient has been diagnosed with bipolar disorder and has been treated at [facility] psychiatric counseling [facility] with medication management. Reportedly his medication was recently changed to [place] about 4 days ago prior to the incident. Patient has been depressed over few weeks and repeatedly makes statements like "I cannot eat like this" . Patient wife stated that he does not like changes and mostly reacts impulsivliy. Patient and his family relocated from apartment to a newly bought condo in [place], even though initially he was excited and he started worrying about getting into financial problems for no reason. He has multiple previous suicide attempts with overdose on his medications and self injurious behaviors. Patient has at least 4 Acute psychiatric hospitalizations at [facility] and his last admission was March 2014. His current medications are Latuda 40 mg a day, Klonopin 1 mg at bedtime for anxiety and trazodone at bedtime for sleep. Patient also has individual therapist [name] at [facility] out patient care.   Elements:  Location:  [facility] adult. Quality:  Bipolar depression. Severity:  severe suicidal attempt. Timing:  relocation of home. Duration:  few days. Context:  suicidal attempt and change of his medication. Associated Signs/Synptoms: Depression Symptoms:  depressed mood, anhedonia, insomnia, psychomotor retardation, fatigue, feelings of worthlessness/guilt, difficulty  concentrating, hopelessness, impaired memory, suicidal attempt, decreased labido, decreased appetite, (Hypo) Manic Symptoms:  Distractibility, Flight of Ideas, Impulsivity, Irritable Mood, Labiality of Mood, Anxiety Symptoms:  Excessive Worry, Psychotic Symptoms:  none PTSD Symptoms: NA  Psychiatric Specialty Exam: Physical Exam  ROS  Blood pressure 100/63, pulse 87, temperature 98.2 F (36.8 C), temperature source Oral, resp. rate 16, height 5\' 8"  (1.727 m), weight 112.946 kg (249 lb).Body mass index is 37.87 kg/(m^2).  General Appearance: Disheveled and Guarded  Eye Contact::  Minimal  Speech:  Clear and Coherent and Slow  Volume:  Decreased  Mood:  Anxious, Depressed, Hopeless and Worthless  Affect:  Constricted, Depressed and Flat  Thought Process:  Coherent and Intact  Orientation:  Full (Time, Place, and Person)  Thought Content:  Obsessions and Rumination  Suicidal Thoughts:  Yes.  with intent/plan  Homicidal Thoughts:  No  Memory:  Immediate;   Fair  Judgement:  Impaired  Insight:  Lacking  Psychomotor Activity:  Psychomotor Retardation  Concentration:  Fair  Recall:  Fair  Akathisia:  NA  Handed:  Right  AIMS (if indicated):     Assets:  Communication Skills Desire for Improvement Housing Intimacy Physical Health Resilience Social Support Transportation  Sleep:  Number of Hours: 6.25    Past Psychiatric History: Diagnosis:  Hospitalizations:  Outpatient Care:  Substance Abuse Care:  Self-Mutilation:  Suicidal Attempts:  Violent Behaviors:   Past Medical History:   Past Medical History  Diagnosis Date  . Bipolar disorder     Has psychiatrist.  BH admission (suicidal) 07/20/11.  Marland Kitchen Hyperlipemia     Intol of meds: GI side effects  . 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  No hx of MRSA infection.  . Tinnitus     with bilat hearing loss (secondary to  excessive hunting/shooting)  . Nephrolithiasis   . GERD (gastroesophageal reflux  disease)   . Inguinal hernia 02/25/11    Left sided hernia with fat  . Hip pain, left 2011-2012    Left hip and groin: MRI pelvis and left hip 02/18/11 showed NORMAL hip, with small inguinal hernia containing fat and sigmoid diverticulosis.  Hip pain presumably referred pain from hernia/diverticular dz??.  . Cervical spondylosis     Primarily C4-5 and C5-6--referred to [facility] 02/2011.  . Depression   . Bipolar disorder, current episode depressed, moderate   . Sleep apnea   . Asthma   . Overdose of benzodiazepine 04/30/2013    Status post   None. Allergies:   Allergies  Allergen Reactions  . Codeine   . Penicillins Hives and Other (See Comments)    Watery blisters   PTA Medications: Prescriptions prior to admission  Medication Sig Dispense Refill  . lurasidone (LATUDA) 40 MG TABS tablet Take 40 mg by mouth at bedtime and may repeat dose one time if needed.      Marland Kitchen omeprazole (PRILOSEC) 40 MG capsule Take 1 capsule (40 mg total) by mouth 2 (two) times daily.  1 capsule  0  . oxymetazoline (AFRIN) 0.05 % nasal spray Place 2 sprays into the nose 2 (two) times daily as needed for congestion (use daily for up to 5 days; then rest for 3 days before using it again).  30 mL  0  . traZODone (DESYREL) 100 MG tablet Take 1 tablet (100 mg total) by mouth at bedtime as needed for sleep.  30 tablet  0    Previous Psychotropic Medications:  Medication/Dose  Latuda  klonopin             Substance Abuse History in the last 12 months:  no  Consequences of Substance Abuse: NA  Social History:  reports that he has never smoked. He does not have any smokeless tobacco history on file. He reports that he does not drink alcohol or use illicit drugs. Additional Social History:                      Current Place of Residence:   Place of Birth:   Family Members: Marital Status:  Married Children:  Sons:  Daughters: Relationships: Education:  [employer] Problems/Performance: Religious Beliefs/Practices: History of Abuse (Emotional/Phsycial/Sexual) Teacher, music History:  None. Legal History: Hobbies/Interests:  Family History:   Family History  Problem Relation Age of Onset  . Alcohol abuse Mother   . Alcohol abuse Father   . Depression Daughter   . Paranoid behavior Daughter     Results for orders placed during the hospital encounter of 04/23/13 (from the past 72 hour(s))  GLUCOSE, CAPILLARY     Status: None   Collection Time    04/29/13  7:51 AM      Result Value Range   Glucose-Capillary 96  70 - 99 mg/dL   Comment 1 Notify RN    GLUCOSE, CAPILLARY     Status: Abnormal   Collection Time    04/30/13  7:59 AM      Result Value Range   Glucose-Capillary 101 (*) 70 - 99 mg/dL   Psychological Evaluations:  Assessment:   AXIS I:  Bipolar, Depressed AXIS II:  Deferred AXIS III:   Past Medical History  Diagnosis Date  . Bipolar disorder     Has psychiatrist.  BH admission (suicidal) 07/20/11.  Marland Kitchen  Hyperlipemia     Intol of meds: GI side effects  . 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  No hx of MRSA infection.  . 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 Nephrolithiasis   . GERD (gastroesophageal reflux disease)   . Inguinal hernia 02/25/11    Left sided hernia with fat  . Hip pain, left 2011-2012    Left hip and groin: MRI pelvis and left hip 02/18/11 showed NORMAL hip, with small inguinal hernia containing fat and sigmoid diverticulosis.  Hip pain presumably referred pain from hernia/diverticular dz??.  . Cervical spondylosis     Primarily C4-5 and C5-6--referred to [facility] 02/2011.  . Depression   . Bipolar disorder, current episode depressed, moderate   . Sleep apnea   . Asthma   . Overdose of benzodiazepine 04/30/2013    Status post   AXIS IV:  other psychosocial or environmental problems and  problems related to social environment AXIS V:  41-50 serious symptoms  Treatment Plan/Recommendations:  Admit for crisis stabilization, safe and secure therapeutic milieu and medication management.  Treatment Plan Summary: Daily contact with patient to assess and evaluate symptoms and progress in treatment Medication management Current Medications:  Current Facility-Administered Medications  Medication Dose Route Frequency Provider Last Rate Last Dose  . acetaminophen (TYLENOL) tablet 650 mg  650 mg Oral Q6H PRN Kerry Hough, PA-C   650 mg at 05/01/13 0815  . alum & mag hydroxide-simeth (MAALOX/MYLANTA) 200-200-20 MG/5ML suspension 30 mL  30 mL Oral Q4H PRN Kerry Hough, PA-C   30 mL at 05/01/13 0916  . lurasidone (LATUDA) tablet 40 mg  40 mg Oral QHS,MR X 1 Kerry Hough, PA-C   40 mg at 04/30/13 2238  . magnesium hydroxide (MILK OF MAGNESIA) suspension 30 mL  30 mL Oral Daily PRN Kerry Hough, PA-C      . pantoprazole (PROTONIX) EC tablet 40 mg  40 mg Oral Daily Kerry Hough, PA-C   40 mg at 05/01/13 0811  . sodium chloride (OCEAN) 0.65 % nasal spray 1 spray  1 spray Each Nare PRN Kerry Hough, PA-C      . traZODone (DESYREL) tablet 100 mg  100 mg Oral QHS PRN Kerry Hough, PA-C   100 mg at 04/30/13 2241    Observation Level/Precautions:  15 minute checks  Laboratory:  Reviewed available labs from [place] hospitalization  Psychotherapy:  Individual, group, milieu therapy and family sesssions  Medications:  Latuda and consider IM depot medication, no tranquilizers due to recent overdose  Consultations:  none  Discharge Concerns:  safety  Estimated LOS: 5-7 days  Other:     I certify that inpatient services furnished can reasonably be expected to improve the patient's condition.    Nehemiah Settle., MD 8/13/20141:03 PM

## 2013-05-01 NOTE — Progress Notes (Signed)
D:Pt is awake interacting on the unit this morning. Pt reports that he did not sleep well and c/o of back pain that he rates as a 5 on 1-10 scale with 10 being the most painful. Pt talked about his recent suicide attempt and how his mood is better in the mornings and he becomes more depressed in the evenings. Pt talked about alternative therapies that he has used to help with depression/anxiety such as heart math. Pt said that his wife is religious but that he is not believing that people were created from a "mass of soup." He talked about history and the existence of Jesus.   A:Offered support, encouragement and 15 minute checks. Gave prn medication as needed R:Pt denies si and hi. Pt denies hallucinations. Safety maintained on the unit.

## 2013-05-01 NOTE — [facility] Group Notes (Signed)
[facility] LCSW Group Therapy  05/01/2013  1:15 PM   Type of Therapy:  Group Therapy  Participation Level:  Active  Participation Quality:  Appropriate and Attentive  Affect:  Appropriate, Depressed and Flat  Cognitive:  Alert and Appropriate  Insight:  Developing/Improving and Engaged  Engagement in Therapy:  Developing/Improving and Engaged  Modes of Intervention:  Clarification, Confrontation, Discussion, Education, Exploration, Limit-setting, Orientation, Problem-solving, Rapport Building, Dance movement psychotherapist, Socialization and Support  Summary of Progress/Problems: The topic for group today was emotional regulation.  This group focused on both positive and negative emotion identification and allowed group members to process ways to identify feelings, regulate negative emotions, and find healthy ways to manage internal/external emotions. Group members were asked to reflect on a time when their reaction to an emotion led to a negative outcome and explored how alternative responses using emotion regulation would have benefited them. Group members were also asked to discuss a time when emotion regulation was utilized when a negative emotion was experienced. Pt shared that he is impulsive and has overdosed four times, acting on his emotions rather than stopping and thinking about it.  Pt was able to relate to peers and process about acting on impulse and ways to stop and think before acting.  Pt actively participated and was engaged in group discussion.    Reyes Ivan, LCSWA 05/01/2013 2:42 PM

## 2013-05-01 NOTE — Progress Notes (Signed)
Recreation Therapy Notes  Date: 08.13.2014 Time: 3:00pm Location: [address] Dayroom  Group Topic: Communication, Team Building, Problem Solving  Goal Area(s) Addresses:  Patient will be able to recognize use of communication, team building and problem solving during course of group activity. Patient will verbalize need for communication, team building and problem solving to make group activity successful.  Patient will verbalize benefit of communication, team building and problem solving to post d/c goals.   Behavioral Response: Engaged, Appropriate, Sharing  Intervention: Team Building Exercise  Activity: [employer]. Patients were given the following supplied: 5 rubber bands, 5 paper clips, 3 index cards, 5 drinking straws and a length of masking tape. In teams of 3, using the provided materials patients were asked to build a structure that could launch a ping pong ball approximately 10 feet.   Education: Customer service manager, Discharge Planning, Relapse Prevention  Education Outcome: Needs additional education.   Clinical Observations/Feedback: Patient made no contributions to opening discussion, but participated with team mates to building launching mechanism. Patient admitted communication between his team mates could have been better during activity. Patient shared that he does not use healthy communication outside of the hospital, explaining that he has a hard time trusting people. Patient explained he has built a wall around himself and in the past when he has let people in they have devalued the trust he has put in them. Patient shared that this has stayed with him and prevented him from building a support system outside of his family.   Marykay Lex Carlas Vandyne, LRT/CTRS  Jearl Klinefelter 05/01/2013 5:14 PM

## 2013-05-01 NOTE — Progress Notes (Signed)
Adult Psychoeducational Group Note  Date:  05/01/2013 Time:  1:52 PM  Group Topic/Focus:  Crisis Planning:   The purpose of this group is to help patients create a crisis plan for use upon discharge or in the future, as needed.  Participation Level:  Active  Participation Quality:  Appropriate, Attentive, Sharing and Supportive  Affect:  Appropriate  Cognitive:  Alert and Appropriate  Insight: Appropriate and Good  Engagement in Group:  Engaged  Modes of Intervention:  Discussion, Education and Support  Additional Comments:  Pt discussed the crisis that brought him to the hospital and his triggers. Pt was asked to think about if this was a crisis he was able to control, change or a reoccurring crisis and what he could have possibly done to change the situation if anything.  When coping skills were discussed at the end of the group, pt stated "I dont buy into the whole coping skills thing. It doesn't work for me." Pt was encouraged by other pts to keep trying different coping skills and challenging himself till he finds something that works. Pt was given the definition of a coping skill and asked to think about why coping skills havent worked in the past for him and what coping skills exactly they were.    Dalia Heading 05/01/2013, 1:52 PM

## 2013-05-01 NOTE — Tx Team (Signed)
Interdisciplinary Treatment Plan Update (Adult)  Date: 05/01/2013  Time Reviewed:  9:45 AM  Progress in Treatment: Attending groups: Yes Participating in groups:  Yes Taking medication as prescribed:  Yes Tolerating medication:  Yes Family/Significant othe contact made: CSW assessing  Patient understands diagnosis:  Yes Discussing patient identified problems/goals with staff:  Yes Medical problems stabilized or resolved:  Yes Denies suicidal/homicidal ideation: Yes Issues/concerns per patient self-inventory:  Yes Other:  New problem(s) identified: N/A  Discharge Plan or Barriers: CSW will schedule pt's follow up appointments.  Pt is seen by [facility] and Maxcine Ham for medication management and therapy.    Reason for Continuation of Hospitalization: Anxiety Depression Medication Stabilization  Comments: N/A  Estimated length of stay: 3-5 days  For review of initial/current patient goals, please see plan of care.  Attendees: Patient:     Family:     Physician:  Dr. Javier Glazier 05/01/2013 9:56 AM   Nursing:   Harold Barban, RN 05/01/2013 9:56 AM   Clinical Social Worker:  Reyes Ivan, LCSWA 05/01/2013 9:56 AM   Other: [name], PA 05/01/2013 9:56 AM   Other:  Frankey Shown, MA care coordination 05/01/2013 9:56 AM   Other:  Juline Patch, LCSW 05/01/2013 9:56 AM   Other:  Chinita Greenland, RN 05/01/2013 9:56 AM  Other:    Other:    Other:    Other:    Other:    Other:     Scribe for Treatment Team:   Carmina Miller, 05/01/2013 9:56 AM

## 2013-05-01 NOTE — Progress Notes (Signed)
Adult Psychoeducational Group Note  Date:  05/01/2013 Time:  10:00AM Group Topic/Focus:  Therapeutic Activity  Participation Level:  Active  Participation Quality:  Appropriate and Attentive  Affect:  Appropriate  Cognitive:  Alert and Appropriate  Insight: Appropriate  Engagement in Group:  Engaged  Modes of Intervention:  Discussion  Additional Comments: Pt. Was attentive and appropriate during today's therapeutic activity. Pt. Was able to participate in build a bear and the subject was personal development. Pt. Was able to have positive interaction and communication with peers. Pt. Was able to guess the following words Goals, Values, Self Control, and various words dealing with today's topic of personal development.   Bing Plume D 05/01/2013, 10:34 AM

## 2013-05-02 DIAGNOSIS — F319 Bipolar disorder, unspecified: Secondary | ICD-10-CM

## 2013-05-02 MED ORDER — NON FORMULARY: 400.0000 mg | Freq: Once | Status: DC

## 2013-05-02 MED ORDER — ARIPIPRAZOLE 5 MG PO TABS
5.0000 mg | ORAL_TABLET | Freq: Once | ORAL | Status: AC
  Administered 2013-05-02: 5 mg via ORAL
  Filled 2013-05-02 (×2): qty 1

## 2013-05-02 MED ORDER — ARIPIPRAZOLE ER 400 MG IM SUSR
400.0000 mg | Freq: Once | INTRAMUSCULAR | Status: AC
  Administered 2013-05-03: 400 mg via INTRAMUSCULAR

## 2013-05-02 NOTE — Progress Notes (Signed)
Adult Psychoeducational Group Note  Date:  05/02/2013 Time:  2:47 PM  Group Topic/Focus:  Overcoming Stress:   The focus of this group is to define stress and help patients assess their triggers.  Participation Level:  None  Participation Quality:  Resistant  Affect:  Angry and Defensive  Cognitive:  Oriented  Insight: None  Engagement in Group:  None  Modes of Intervention:  Activity, Discussion, Education and Support  Additional Comments:  Pt attended but did not participate.   Reynolds Bowl 05/02/2013, 2:47 PM

## 2013-05-02 NOTE — Progress Notes (Signed)
Patient ID: Sean Hill, male   DOB: [birth year], 72 y.o.   MRN: [id number] Pt attended group but did not want to participate. He remained silent until the very end when he shared about what it was like skydiving.

## 2013-05-02 NOTE — Progress Notes (Signed)
[facility] MD Progress Note  05/02/2013 3:34 PM Sean Hill  MRN:  [id number] Subjective:  I've signed a 72 hour notice for discharge. Objective: Patient states that he is angry over several things, including his nurse yesterday who offered him ginger ale for his upset stomach. He was also angry as he was asked not to wear his "T shirt with the pirate on it, as it may be offensive to others." He refuses to take any other medication when suggested. Diagnosis:  Bipolar disorder   ADL's:  Intact  Sleep: Poor  Appetite:  Fair  Suicidal Ideation:  denies Homicidal Ideation:  "not at this time." AEB (as evidenced by):  Psychiatric Specialty Exam: Review of Systems  Constitutional: Negative.  Negative for fever, chills, weight loss, malaise/fatigue and diaphoresis.  HENT: Negative for congestion and sore throat.   Eyes: Negative for blurred vision, double vision and photophobia.  Respiratory: Negative for cough, shortness of breath and wheezing.   Cardiovascular: Negative for chest pain, palpitations and PND.  Gastrointestinal: Negative for heartburn, nausea, vomiting, abdominal pain, diarrhea and constipation.  Musculoskeletal: Negative for myalgias, joint pain and falls.  Neurological: Negative for dizziness, tingling, tremors, sensory change, speech change, focal weakness, seizures, loss of consciousness, weakness and headaches.  Endo/Heme/Allergies: Negative for polydipsia. Does not bruise/bleed easily.  Psychiatric/Behavioral: Negative for depression, suicidal ideas, hallucinations, memory loss and substance abuse. The patient is not nervous/anxious and does not have insomnia.     Blood pressure 100/63, pulse 87, temperature 98.2 F (36.8 C), temperature source Oral, resp. rate 16, height 5\' 8"  (1.727 m), weight 112.946 kg (249 lb).Body mass index is 37.87 kg/(m^2).  General Appearance: Casual  Eye Contact::  Fair  Speech:  Pressured  Volume:  Increased  Mood:  Irritable   Affect:  Labile  Thought Process:  Circumstantial  Orientation:  Full (Time, Place, and Person)  Thought Content:  Negative  Suicidal Thoughts:  No  Homicidal Thoughts:  No  Memory:  NA Immediate;   Fair  Judgement:  Impaired  Insight:  Lacking  Psychomotor Activity:  Normal  Concentration:  Poor  Recall:  Poor  Akathisia:  No  Handed:  Right  AIMS (if indicated):     Assets:  Communication Skills Desire for Improvement Housing  Sleep:  Number of Hours: 5.75   Current Medications: Current Facility-Administered Medications  Medication Dose Route Frequency Provider Last Rate Last Dose  . acetaminophen (TYLENOL) tablet 650 mg  650 mg Oral Q6H PRN Kerry Hough, PA-C   650 mg at 05/01/13 0815  . alum & mag hydroxide-simeth (MAALOX/MYLANTA) 200-200-20 MG/5ML suspension 30 mL  30 mL Oral Q4H PRN Kerry Hough, PA-C   30 mL at 05/01/13 2000  . famotidine (PEPCID) tablet 20 mg  20 mg Oral BID Kerry Hough, PA-C   20 mg at 05/02/13 0754  . lurasidone (LATUDA) tablet 40 mg  40 mg Oral QHS Kerry Hough, PA-C   40 mg at 05/01/13 2213  . magnesium hydroxide (MILK OF MAGNESIA) suspension 30 mL  30 mL Oral Daily PRN Kerry Hough, PA-C      . pantoprazole (PROTONIX) EC tablet 40 mg  40 mg Oral Daily Kerry Hough, PA-C   40 mg at 05/02/13 0753  . sodium chloride (OCEAN) 0.65 % nasal spray 1 spray  1 spray Each Nare PRN Kerry Hough, PA-C      . traZODone (DESYREL) tablet 100 mg  100 mg Oral QHS PRN Mena Goes  Simon, PA-C   100 mg at 05/01/13 2213    Lab Results: No results found for this or any previous visit (from the past 48 hour(s)).  Physical Findings: AIMS: Facial and Oral Movements Muscles of Facial Expression: None, normal Lips and Perioral Area: None, normal Jaw: None, normal Tongue: None, normal,Extremity Movements Upper (arms, wrists, hands, fingers): None, normal Lower (legs, knees, ankles, toes): None, normal, Trunk Movements Neck, shoulders, hips: None,  normal, Overall Severity Severity of abnormal movements (highest score from questions above): None, normal Incapacitation due to abnormal movements: None, normal Patient's awareness of abnormal movements (rate only patient's report): No Awareness, Dental Status Current problems with teeth and/or dentures?: No Does patient usually wear dentures?: No  CIWA:  CIWA-Ar Total: 1 COWS:  COWS Total Score: 7  Treatment Plan Summary: Daily contact with patient to assess and evaluate symptoms and progress in treatment Medication management  Plan: 1. Will initiate Abilify 5mg  po now x 1 day. 2. Will order Abilify Maintenna for tomorrow. 3. Will advocate for patient to get his T shirt back as it is now his focus, and disruptive to the therapeutic process.  Medical Decision Making Problem Points:  Established problem, stable/improving (1), New problem, with no additional work-up planned (3) and Review of psycho-social stressors (1) Data Points:  Review or order medicine tests (1)  I certify that inpatient services furnished can reasonably be expected to improve the patient's condition.  Rona Ravens. [place] RPAC 3:46 PM 05/02/2013  Reviewed the information documented and agree with the treatment plan.  Rayetta Veith,JANARDHAHA R. 05/03/2013 8:43 PM

## 2013-05-02 NOTE — [facility] Group Notes (Signed)
[facility] LCSW Group Therapy  [facility] 1:15 - 2:30 PM  05/02/2013   Type of Therapy:  Group Therapy  Participation Level:  Active  Participation Quality:  Attentive  Affect:  Appropriate  Cognitive:  Appropriate  Insight:  Developing/Improving and Engaged  Engagement in Therapy:  Developing/Improving Engaged  Modes of Intervention:  Discussion, Education, Exploration, Problem-Solving, Rapport Building, Support   Summary of Progress/Problems:  Patient listened attentively to speaker from [facility].   Patient asked questions about services and made comments on the presentation.   He shared he was not aware of [facility].  Wynn Banker 05/02/2013

## 2013-05-02 NOTE — Progress Notes (Signed)
Recreation Therapy Notes  Date: 08.14.2014 Time: 2:45pm Location: [address] Dayroom   Group Topic: Animal Assisted Activities  Goal Area(s) Addresses:  Patient will interact appropriately with dog team.    Behavioral Response: Appropriate.   Education: Engineer, petroleum.   Education Outcome: Acknowledges understanding  Clinical Observations/Feedback: Dog Team: Runner, broadcasting/film/video. Patient interacted appropriately with peer, dog team, LRT and MHT.    Marykay Lex Khalin Royce, LRT/CTRS  Jearl Klinefelter 05/02/2013 7:33 PM

## 2013-05-02 NOTE — Progress Notes (Signed)
Adult Psychoeducational Group Note  Date:  05/02/2013 Time:  9:00 AM  Group Topic/Focus:  Self Esteem Action Plan:   The focus of this group is to help patients create a plan to continue to build self-esteem after discharge.  Participation Level:  Did Not Attend  Additional Comments: Pt. was speaking with the doctor at the time that group was taking place.  Harold Barban E 05/02/2013, 9:43 AM

## 2013-05-02 NOTE — Progress Notes (Signed)
Adult Psychoeducational Group Note  Date:  05/02/2013 Time:  11:29 PM  Group Topic/Focus:  Wrap-Up Group:   The focus of this group is to help patients review their daily goal of treatment and discuss progress on daily workbooks.  Participation Level:  Minimal  Participation Quality:  Appropriate  Affect:  Appropriate  Cognitive:  Appropriate  Insight: Appropriate and Good  Engagement in Group:  Engaged  Modes of Intervention:  Activity  Additional Comments:  Pt. Attended Milana Huntsman, Summer Parthasarathy 05/02/2013, 11:29 PM

## 2013-05-02 NOTE — Progress Notes (Signed)
D: Patient appropriate and cooperative with staff and peers. Patient's affect/mood is flat, depressed and slightly irritable at times. He verbalized that his sleep is poor, appetite is good and energy level is normal. Patient expressed earlier this morning that he has no depression or feelings of hopelessness and would like to go home today.   A: Support and encouragement provided to patient. Scheduled medications administered per MD orders. Maintain Q15 minute checks for safety.  R: Patient receptive. Denies SI/HI/AVH. Patient remains safe.

## 2013-05-03 ENCOUNTER — Ambulatory Visit (HOSPITAL_COMMUNITY): Payer: Self-pay | Admitting: Psychiatry

## 2013-05-03 MED ORDER — ARIPIPRAZOLE 5 MG PO TABS
5.0000 mg | ORAL_TABLET | Freq: Every day | ORAL | Status: DC
  Administered 2013-05-04 – 2013-05-06 (×3): 5 mg via ORAL
  Filled 2013-05-03 (×4): qty 1

## 2013-05-03 MED ORDER — BENZTROPINE MESYLATE 1 MG PO TABS
1.0000 mg | ORAL_TABLET | Freq: Two times a day (BID) | ORAL | Status: DC
  Administered 2013-05-03 – 2013-05-06 (×6): 1 mg via ORAL
  Filled 2013-05-03 (×8): qty 1

## 2013-05-03 NOTE — Progress Notes (Signed)
Patient ID: Sean Hill, male   DOB: [birth year], 72 y.o.   MRN: [id number] D: Took over patient's care @ 2330. Patient in bed sleeping. Respiration regular and unlabored. No sign of distress noted at this time A: 15 mins checks for safety. R: Patient  is safe.

## 2013-05-03 NOTE — [facility] Group Notes (Signed)
[facility] LCSW Aftercare Discharge Planning Group Note   05/03/2013 8:45 AM  Participation Quality:  Alert and Appropriate   Mood/Affect:  Appropriate, Flat and Depressed  Depression Rating:  2  Anxiety Rating:  2  Thoughts of Suicide:  Pt denies SI/HI  Will you contract for safety?   Yes  Current AVH: Pt denies  Plan for Discharge/Comments:  Pt attended discharge planning group and actively participated in group.  CSW provided pt with today's workbook.  Pt reports feeling well today.  Pt states that he decided to go on an injectable.  Pt will return home in [place].  Pt has follow up scheduled at [facility] for medication management and [facility] for therapy and IOP.  No further needs voiced by pt at this time.    Transportation Means: Pt has access to transportation  Supports: No supports mentioned at this time  Reyes Ivan, LCSWA 05/03/2013 10:02 AM

## 2013-05-03 NOTE — Progress Notes (Signed)
The focus of this group is to help patients review their daily goal of treatment and discuss progress on daily workbooks.  During wrap up group, Sean Hill shared that he had a good day because he started a new treatment and he feels hopeful that it will work. 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

## 2013-05-03 NOTE — [facility] Group Notes (Signed)
[facility] LCSW Group Therapy  05/03/2013  1:15 PM   Type of Therapy:  Group Therapy  Participation Level:  Did Not Attend - pt was with PA during group time  [facility], LCSWA 05/03/2013 2:01 PM

## 2013-05-03 NOTE — Progress Notes (Signed)
Adult Psychoeducational Group Note  Date:  05/03/2013 Time:  5:06 PM  Group Topic/Focus:  Relapse Prevention Planning:   The focus of this group is to define relapse and discuss the need for planning to combat relapse.  Participation Level:  Active  Participation Quality:  Appropriate, Sharing and Supportive  Affect:  Appropriate  Cognitive:  Appropriate  Insight: Appropriate  Engagement in Group:  Engaged and Supportive  Modes of Intervention:  Discussion, Education, Socialization and Support  Additional Comments:  Pt stated he wanted to find out what he was doing wrong in order to prevent relapse in the future. Staff encouraged him to start on something small in order to work towards a larger goal. Pt stated he wanted to work on staying on his meds and finding the right meds.  Sean Hill 05/03/2013, 5:06 PM

## 2013-05-03 NOTE — Tx Team (Signed)
Interdisciplinary Treatment Plan Update (Adult)  Date: 05/03/2013  Time Reviewed:  9:45 AM  Progress in Treatment: Attending groups: Yes Participating in groups:  Yes Taking medication as prescribed:  Yes Tolerating medication:  Yes Family/Significant othe contact made: Yes Patient understands diagnosis:  Yes Discussing patient identified problems/goals with staff:  Yes Medical problems stabilized or resolved:  Yes Denies suicidal/homicidal ideation: Yes Issues/concerns per patient self-inventory:  Yes Other:  New problem(s) identified: N/A  Discharge Plan or Barriers: Pt is scheduled to follow up at [facility] for medication management and [facility] for therapy, as well as IOP.    Reason for Continuation of Hospitalization: Anxiety Depression Mania Medication Stabilization  Comments: Pt signed 72 hour request for d/c but is not stable and was recently started on meds.  IVC will be needed this weekend.    Estimated length of stay: 2-3 days  For review of initial/current patient goals, please see plan of care.  Attendees: Patient:     Family:     Physician:  Dr. Javier Glazier 05/03/2013 9:40 AM   Nursing:   Lamount Cranker, RN 05/03/2013 9:40 AM   Clinical Social Worker:  Reyes Ivan, LCSWA 05/03/2013 9:40 AM   Other: [name], PA 05/03/2013 9:40 AM   Other:  Frankey Shown, MA care coordination 05/03/2013 9:40 AM   Other:  Juline Patch, LCSW 05/03/2013 9:40 AM   Other:  Harold Barban, RN 05/03/2013 9:40 AM  Other:    Other:    Other:    Other:    Other:    Other:     Scribe for Treatment Team:   Carmina Miller, 05/03/2013 9:40 AM

## 2013-05-03 NOTE — Progress Notes (Signed)
Adult Psychoeducational Group Note  Date:  05/03/2013 Time:  11:32 AM  Group Topic/Focus:  Therapeutic Activity  Participation Level:  Active  Participation Quality:  Appropriate  Affect:  Appropriate  Cognitive:  Appropriate  Insight: Appropriate  Engagement in Group:  Engaged  Modes of Intervention:  Discussion    Sean Hill 05/03/2013, 11:32 AM

## 2013-05-03 NOTE — [facility] Suicide Risk Assessment (Signed)
[facility] INPATIENT:  Family/Significant Other Suicide Prevention Education  Suicide Prevention Education:  Education Completed; Sean Hill - wife [phone]),  (name of family member/significant other) has been identified by the patient as the family member/significant other with whom the patient will be residing, and identified as the person(s) who will aid the patient in the event of a mental health crisis (suicidal ideations/suicide attempt).  With written consent from the patient, the family member/significant other has been provided the following suicide prevention education, prior to the and/or following the discharge of the patient.  The suicide prevention education provided includes the following:  Suicide risk factors  Suicide prevention and interventions  National Suicide Hotline telephone number  [facility] assessment telephone number  [facility] Emergency Assistance 911  [facility] and/or Residential Mobile Crisis Unit telephone number  Request made of family/significant other to:  Remove weapons (e.g., guns, rifles, knives), all items previously/currently identified as safety concern.    Remove drugs/medications (over-the-counter, prescriptions, illicit drugs), all items previously/currently identified as a safety concern.  The family member/significant other verbalizes understanding of the suicide prevention education information provided.  The family member/significant other agrees to remove the items of safety concern listed above.  Sean Hill 05/03/2013, 11:13 AM

## 2013-05-03 NOTE — Progress Notes (Signed)
D: Patient appropriate and cooperative with staff and peers. Patient's affect/mood is anxious. He reported on the self inventory sheet that his sleep is poor, appetite and ability to pay attention are both good and energy level is normal. Patient rated depression "2" and feelings of hopelessness "1". He's attending groups and interacting with peers in the milieu. Compliant with medication regimen. Patient is no longer expressing the desire to be discharged from the facility.  A: Support and encouragement provided to patient. Administered scheduled medications per ordering MD. Monitor Q15 minute checks for safety.  R: Patient receptive. Denies SI/HI/AVH. Patient remains safe on the unit.

## 2013-05-03 NOTE — Progress Notes (Signed)
Patient ID: Sean Hill, male   DOB: 1940/12/16, 72 y.o.   MRN: [id number] [facility] MD Progress Note  05/03/2013 4:28 PM Sean Hill  MRN:  [id number]  Subjective: "I'm doing just great, I don't know if it is placebo effect or not." Objective: Patient is up and active on unit milieu. Has reported feeling "great" notes his depression is a 1-2 out of 10, but had a "wave of depression" last night lasting 1 hour which he noted was an 8/10. He noted that it came on suddenly and did not have any triggers. He reports he feels fine now and is no longer anxious.  Cobi understands that he will not be leaving over the weekend.  He states he has to be out of the hospital by the "30th because he has reservations at the beach, and his wife is tired of dealing with all of her issues alone."  Diagnosis:  Bipolar disorder   ADL's:  Intact  Sleep: Poor  Appetite:  Fair  Suicidal Ideation:  denies Homicidal Ideation:  "not at this time." AEB (as evidenced by):  Psychiatric Specialty Exam: Review of Systems  Constitutional: Negative.  Negative for fever, chills, weight loss, malaise/fatigue and diaphoresis.  HENT: Negative for congestion and sore throat.   Eyes: Negative for blurred vision, double vision and photophobia.  Respiratory: Negative for cough, shortness of breath and wheezing.   Cardiovascular: Negative for chest pain, palpitations and PND.  Gastrointestinal: Negative for heartburn, nausea, vomiting, abdominal pain, diarrhea and constipation.  Musculoskeletal: Negative for myalgias, joint pain and falls.  Neurological: Negative for dizziness, tingling, tremors, speech change, focal weakness, seizures, loss of consciousness, weakness and headaches. Sensory change: notes that the coner of his mouth is drooling since starting Latuda.  Endo/Heme/Allergies: Negative for polydipsia. Does not bruise/bleed easily.  Psychiatric/Behavioral: Negative for depression, suicidal ideas,  hallucinations, memory loss and substance abuse. The patient is not nervous/anxious and does not have insomnia.     Blood pressure 142/72, pulse 85, temperature 97.9 F (36.6 C), temperature source Oral, resp. rate 18, height 5\' 8"  (1.727 m), weight 112.946 kg (249 lb).Body mass index is 37.87 kg/(m^2).  General Appearance: Casual  Eye Contact::  Fair  Speech:  Pressured  Volume:  Increased  Mood:  Irritable  Affect:  Labile  Thought Process:  Circumstantial  Orientation:  Full (Time, Place, and Person)  Thought Content:  Negative  Suicidal Thoughts:  No  Homicidal Thoughts:  No  Memory:  NA Immediate;   Fair  Judgement:  Impaired  Insight:  Lacking  Psychomotor Activity:  Normal  Concentration:  Poor  Recall:  Poor  Akathisia:  No  Handed:  Right  AIMS (if indicated):     Assets:  Communication Skills Desire for Improvement Housing  Sleep:  Number of Hours: 5   Current Medications: Current Facility-Administered Medications  Medication Dose Route Frequency Provider Last Rate Last Dose  . acetaminophen (TYLENOL) tablet 650 mg  650 mg Oral Q6H PRN Kerry Hough, PA-C   650 mg at 05/01/13 0815  . alum & mag hydroxide-simeth (MAALOX/MYLANTA) 200-200-20 MG/5ML suspension 30 mL  30 mL Oral Q4H PRN Kerry Hough, PA-C   30 mL at 05/01/13 2000  . benztropine (COGENTIN) tablet 1 mg  1 mg Oral BID [name], PA-C   1 mg at 05/03/13 1409  . famotidine (PEPCID) tablet 20 mg  20 mg Oral BID Kerry Hough, PA-C   20 mg at 05/03/13 0746  . lurasidone (  LATUDA) tablet 40 mg  40 mg Oral QHS Kerry Hough, PA-C   40 mg at 05/02/13 2148  . magnesium hydroxide (MILK OF MAGNESIA) suspension 30 mL  30 mL Oral Daily PRN Kerry Hough, PA-C      . pantoprazole (PROTONIX) EC tablet 40 mg  40 mg Oral Daily Kerry Hough, PA-C   40 mg at 05/03/13 0745  . sodium chloride (OCEAN) 0.65 % nasal spray 1 spray  1 spray Each Nare PRN Kerry Hough, PA-C      . traZODone (DESYREL) tablet 100  mg  100 mg Oral QHS PRN Kerry Hough, PA-C   100 mg at 05/02/13 2148    Lab Results: No results found for this or any previous visit (from the past 48 hour(s)).  Physical Findings: AIMS: Facial and Oral Movements Muscles of Facial Expression: None, normal Lips and Perioral Area: None, normal Jaw: None, normal Tongue: None, normal,Extremity Movements Upper (arms, wrists, hands, fingers): None, normal Lower (legs, knees, ankles, toes): None, normal, Trunk Movements Neck, shoulders, hips: None, normal, Overall Severity Severity of abnormal movements (highest score from questions above): None, normal Incapacitation due to abnormal movements: None, normal Patient's awareness of abnormal movements (rate only patient's report): No Awareness, Dental Status Current problems with teeth and/or dentures?: No Does patient usually wear dentures?: No  CIWA:  CIWA-Ar Total: 1 COWS:  COWS Total Score: 7  Treatment Plan Summary: Daily contact with patient to assess and evaluate symptoms and progress in treatment Medication management  Plan: 1. Will D/C Latuda due to his reports of drooling on the corner of his mouth.  2. Will add Cogentin to cover any EPS or dystonic conditions. 3. Will give oral Abilify 5mg  po qd starting tomorrow. 4. He is NOT a weekend discharge and should be re-petitioned if he tries to leave the hospital. Medical Decision Making Problem Points:  Established problem, stable/improving (1), New problem, with no additional work-up planned (3) and Review of psycho-social stressors (1) Data Points:  Review or order medicine tests (1)  I certify that inpatient services furnished can reasonably be expected to improve the patient's condition.  Rona Ravens. [place] RPAC 4:28 PM 05/03/2013  Reviewed the information documented and agree with the treatment plan.  Analeya Luallen,JANARDHAHA R. 05/03/2013 8:47 PM

## 2013-05-04 NOTE — Progress Notes (Signed)
 .  Psychoeducational Group Note    Date: 05/04/2013 Time:  0930   Goal Setting Purpose of Group: To be able to set a goal that is measurable and that can be accomplished in one day Participation Level:  Active  Participation Quality:  Appropriate  Affect:  Appropriate  Cognitive:  Appropriate  Insight:  Engaged and Improving  Engagement in Group:  Engaged  Additional Comments:   Dione Housekeeper

## 2013-05-04 NOTE — Progress Notes (Signed)
[facility] Group Notes:  (Nursing/MHT/Case Management/Adjunct)  Date:  05/04/2013  Time:  9:45 PM  Type of Therapy:  Psychoeducational Skills  Participation Level:  Minimal  Participation Quality:  Attentive  Affect:  Appropriate  Cognitive:  Appropriate  Insight:  Appropriate  Engagement in Group:  Engaged  Modes of Intervention:  Support  Summary of Progress/Problems: Pt was appropriate during the group this evening. Pt mentioned that he was feeling a lot better today and he was able to participate in a majority of the groups today. Pt made no mention of any pain or discomfort  Jola Baptist 05/04/2013, 9:45 PM

## 2013-05-04 NOTE — Progress Notes (Signed)
Patient ID: Sean Hill, male   DOB: August 02, 1941, 72 y.o.   MRN: [id number] D: Pt is awake and active on the unit this AM. Pt denies SI/HI and A/V hallucinations. Pt rates their depression at 2 and hopelessness at 0. Pt mood is depressed/irritable and his affect is sad. Pt writes that he hopes to "develop a hobby" after discharge. Writer discussed pt's issues with anger, depression and compulsive behavior. Pt stated that he didn't really want to kill himself during this last SA, but he did it anyway. Writer suggested that these suicide attempts have become a habitual unhealthy-coping mechanism that he uses without even thinking, and that he try to develop a mental pause button in order to create a space of awareness without reacting. Pt acknowledges that he needs to stop and think before he acts. Pt states that he will begin to examine all of his interactions in order to change his negative behavior patterns, and be mindful of his thoughts and emotions as they arise. Pt seems hopeful and optomistic about effecting positive change in his life.   A: Encouraged pt to discuss feelings with staff and administered medication per MD orders. Writer also encouraged pt to participate in groups.  R: Pt is attending groups and tolerating medications well. Writer will continue to monitor. 15 minute checks are ongoing for safety.

## 2013-05-04 NOTE — Progress Notes (Signed)
Adult Psychoeducational Group Note  Date:  05/04/2013 Time:  1:15PM  Group Topic/Focus:  Developing a Wellness Toolbox:   The focus of this group is to help patients develop a "wellness toolbox" with skills and strategies to promote recovery upon discharge.  Participation Level:  Did Not Attend  Additional Comments:  Pt did not attend group session.  [name] R 05/04/2013, 3:03 PM

## 2013-05-04 NOTE — Progress Notes (Signed)
Psychoeducational Group Note  Date: 05/04/2013 Time:  1015  Group Topic/Focus:  Identifying Needs:   The focus of this group is to help patients identify their personal needs that have been historically problematic and identify healthy behaviors to address their needs.  Participation Level:  Active  Participation Quality:  Appropriate  Affect:  Appropriate  Cognitive:  Appropriate  Insight:  Improving  Engagement in Group:  Engaged  Additional Comments:    Bridger Pizzi A  

## 2013-05-04 NOTE — [facility] Group Notes (Signed)
[facility] Group Notes:  (Clinical Social Work)  05/04/2013   3:00-4:00PM  Summary of Progress/Problems:   The main focus of today's process group was for the patient to identify ways in which they have sabotaged their own mental health wellness/recovery.  Motivational interviewing was used to explore the reasons they engage in this behavior, and reasons they may have for wanting to change.  The Stages of Change were explained to the group using a handout, and patients identified where they are with regard to changing self-defeating behaviors.  The patient expressed with insight that he has progressively turned to suicidal gestures and attempts to help him cope with things that are going wrong in his life.  He states he has become more and more impulsive, and his last attempt was almost successful and took place very quickly with no consideration.  He reported that he must change if he wants to live,  He is in the Preparation Stage and showed considerable insight into the need for specific plans.  Type of Therapy:  Process Group  Participation Level:  Active  Participation Quality:  Attentive and Sharing  Affect:  Blunted and Depressed  Cognitive:  Appropriate and Oriented  Insight:  Engaged  Engagement in Therapy:  Engaged  Modes of Intervention:  Education, Motivational Interviewing   Ambrose Mantle, LCSW 05/04/2013, 4:31 PM

## 2013-05-04 NOTE — Progress Notes (Signed)
Patient ID: ANTWIONE PICOTTE, male   DOB: 1941-06-30, 72 y.o.   MRN: [id number] D)  Has been talking to a male peer near his age most of the evening, stated is feeling better, smiling.  States he had a small disagreement with his wife prior to this OD, states has done it before.  Was able to contract for safety and currently denies thoughts of self harm.  Attended group this evening, then chatted with previously mentioned male peer. Came to med window for hs meds, went to bed shortly after. A)  Will continue to monitor for safety, continue POC R)  Safety maintained.

## 2013-05-04 NOTE — Progress Notes (Signed)
Patient ID: Sean Hill, male   DOB: 28-Mar-1941, 72 y.o.   MRN: [id number]  [facility] MD Progress Note  05/04/2013 3:24 PM Sean Hill  MRN:  [id number]  Subjective:  Patient states "I had a really serious overdose. I was always planning on suicide. Since I had that injection I am not thinking like that. See I was tweaking my own medication, taking too much Abilify or not taking it. I think this shot will work well for me in the future. I'm really feeling better." Objective: Patient continues to appear depressed. The patient remains at risk for self harm due to mood swings and impulsive behaviors involving serious self harm.   Diagnosis:  Bipolar disorder   ADL's:  Intact  Sleep: Poor  Appetite:  Fair  Suicidal Ideation:  denies Homicidal Ideation:  "not at this time." AEB (as evidenced by):  Psychiatric Specialty Exam: Review of Systems  Constitutional: Negative.  Negative for fever, chills, weight loss, malaise/fatigue and diaphoresis.  HENT: Negative for congestion and sore throat.   Eyes: Negative for blurred vision, double vision and photophobia.  Respiratory: Negative for cough, shortness of breath and wheezing.   Cardiovascular: Negative for chest pain, palpitations and PND.  Gastrointestinal: Negative for heartburn, nausea, vomiting, abdominal pain, diarrhea and constipation.  Genitourinary: Negative.   Musculoskeletal: Negative for myalgias, joint pain and falls.  Neurological: Negative for dizziness, tingling, tremors, speech change, focal weakness, seizures, loss of consciousness, weakness and headaches. Sensory change: notes that the coner of his mouth is drooling since starting Latuda.  Endo/Heme/Allergies: Negative for polydipsia. Does not bruise/bleed easily.  Psychiatric/Behavioral: Positive for depression and suicidal ideas. Negative for hallucinations, memory loss and substance abuse. The patient is not nervous/anxious and does not have insomnia.      Blood pressure 116/75, pulse 101, temperature 98.4 F (36.9 C), temperature source Oral, resp. rate 20, height 5\' 8"  (1.727 m), weight 112.946 kg (249 lb).Body mass index is 37.87 kg/(m^2).  General Appearance: Casual  Eye Contact::  Fair  Speech:  Pressured  Volume:  Increased  Mood:  Irritable  Affect:  Labile  Thought Process:  Circumstantial  Orientation:  Full (Time, Place, and Person)  Thought Content:  Negative  Suicidal Thoughts:  No  Homicidal Thoughts:  No  Memory:  NA Immediate;   Fair  Judgement:  Impaired  Insight:  Lacking  Psychomotor Activity:  Normal  Concentration:  Poor  Recall:  Poor  Akathisia:  No  Handed:  Right  AIMS (if indicated):     Assets:  Communication Skills Desire for Improvement Housing  Sleep:  Number of Hours: 5   Current Medications: Current Facility-Administered Medications  Medication Dose Route Frequency Provider Last Rate Last Dose  . acetaminophen (TYLENOL) tablet 650 mg  650 mg Oral Q6H PRN Kerry Hough, PA-C   650 mg at 05/01/13 0815  . alum & mag hydroxide-simeth (MAALOX/MYLANTA) 200-200-20 MG/5ML suspension 30 mL  30 mL Oral Q4H PRN Kerry Hough, PA-C   30 mL at 05/01/13 2000  . benztropine (COGENTIN) tablet 1 mg  1 mg Oral BID [name], PA-C   1 mg at 05/03/13 1409  . famotidine (PEPCID) tablet 20 mg  20 mg Oral BID Kerry Hough, PA-C   20 mg at 05/03/13 0746  . lurasidone (LATUDA) tablet 40 mg  40 mg Oral QHS Kerry Hough, PA-C   40 mg at 05/02/13 2148  . magnesium hydroxide (MILK OF MAGNESIA) suspension 30 mL  30  mL Oral Daily PRN Kerry Hough, PA-C      . pantoprazole (PROTONIX) EC tablet 40 mg  40 mg Oral Daily Kerry Hough, PA-C   40 mg at 05/03/13 0745  . sodium chloride (OCEAN) 0.65 % nasal spray 1 spray  1 spray Each Nare PRN Kerry Hough, PA-C      . traZODone (DESYREL) tablet 100 mg  100 mg Oral QHS PRN Kerry Hough, PA-C   100 mg at 05/02/13 2148    Lab Results: No results found for this  or any previous visit (from the past 48 hour(s)).  Physical Findings: AIMS: Facial and Oral Movements Muscles of Facial Expression: None, normal Lips and Perioral Area: None, normal Jaw: None, normal Tongue: None, normal,Extremity Movements Upper (arms, wrists, hands, fingers): None, normal Lower (legs, knees, ankles, toes): None, normal, Trunk Movements Neck, shoulders, hips: None, normal, Overall Severity Severity of abnormal movements (highest score from questions above): None, normal Incapacitation due to abnormal movements: None, normal Patient's awareness of abnormal movements (rate only patient's report): No Awareness, Dental Status Current problems with teeth and/or dentures?: No Does patient usually wear dentures?: No  CIWA:  CIWA-Ar Total: 1 COWS:  COWS Total Score: 7  Treatment Plan Summary: Daily contact with patient to assess and evaluate symptoms and progress in treatment Medication management  Plan: Continue crisis management and stabilization.  Medication management: Continue Abilify 5mg  daily for mood stability and scheduled Cogentin.  Encouraged patient to attend groups and participate in group counseling sessions and activities.  Discharge plan in progress.  Continue current treatment plan.  Address health issues: Vitals reviewed and stable.  Medical Decision Making Problem Points:  Established problem, stable/improving (1), New problem, with no additional work-up planned (3) and Review of psycho-social stressors (1) Data Points:  Review or order medicine tests (1)  I certify that inpatient services furnished can reasonably be expected to improve the patient's condition.  Fransisca Kaufmann NP-C 3:24 PM 05/04/2013  Jaquane Boughner, Vernona Rieger 05/04/2013 3:24 PM

## 2013-05-05 NOTE — [facility] Group Notes (Signed)
[facility] Group Notes:  (Clinical Social Work)  05/05/2013   3:00-4:00PM  Summary of Progress/Problems:   The main focus of today's process group was to   identify the patient's current support system and decide on other supports that can be put in place.  The picture on workbook was used to discuss why additional supports are needed, and a hand-out was distributed with four definitions/levels of support, then used to talk about how patients have given and received all different kinds of support.  An emphasis was placed on using counselor, doctor, therapy groups, 12-step groups, and problem-specific support groups to expand supports.  The patient demonstrated good insight and participated fully,    Type of Therapy:  Process Group  Participation Level:  Active  Participation Quality:  Appropriate, Attentive, Sharing and Supportive  Affect:  Blunted  Cognitive:  Appropriate  Insight:  Engaged  Engagement in Therapy:  Engaged  Modes of Intervention:  Education,  Support and [employer], LCSW 05/05/2013, 2:08 PM

## 2013-05-05 NOTE — Progress Notes (Signed)
Psychoeducational Group Note  Psychoeducational Group Note  Date: 05/05/2013 Time:  05/05/2013  Group Topic/Focus:  Gratefulness:  The focus of this group is to help patients identify what two things they are most grateful for in their lives. What helps ground them and to center them on their work to their recovery.  Participation Level:  Active  Participation Quality:  Appropriate  Affect:  Appropriate  Cognitive:  Appropriate  Insight:  Improving  Engagement in Group:  Engaged  Additional Comments:    Leshae Mcclay A  

## 2013-05-05 NOTE — Progress Notes (Signed)
Patient ID: Sean Hill, male   DOB: 05-22-1941, 72 y.o.   MRN: [id number]  [facility] MD Progress Note  05/05/2013 10:59 AM LUISFERNANDO BRIGHTWELL  MRN:  [id number]  Subjective:  Patient is up and active on the unit and in the millieu. Reporting he feels "great" sleep is good and appetite is good, states no problems with the medication and his stomach pain has stopped. Objective: Affect is brighter, still remains at risk due to his impulsivity and unpredictability. Tolerating the Abilify well. Anticipating D/C tomorrow per treatment plan barring complications. Diagnosis:  Bipolar disorder   ADL's:  Intact  Sleep: Poor  Appetite:  Fair  Suicidal Ideation:  denies Homicidal Ideation:  "not at this time." AEB (as evidenced by):  Psychiatric Specialty Exam: Review of Systems  Constitutional: Negative.  Negative for fever, chills, weight loss, malaise/fatigue and diaphoresis.  HENT: Negative for congestion and sore throat.   Eyes: Negative for blurred vision, double vision and photophobia.  Respiratory: Negative for cough, shortness of breath and wheezing.   Cardiovascular: Negative for chest pain, palpitations and PND.  Gastrointestinal: Negative for heartburn, nausea, vomiting, abdominal pain, diarrhea and constipation.  Genitourinary: Negative.   Musculoskeletal: Negative for myalgias, joint pain and falls.  Neurological: Negative for dizziness, tingling, tremors, speech change, focal weakness, seizures, loss of consciousness, weakness and headaches. Sensory change: notes that the coner of his mouth is drooling since starting Latuda.  Endo/Heme/Allergies: Negative for polydipsia. Does not bruise/bleed easily.  Psychiatric/Behavioral: Positive for depression and suicidal ideas. Negative for hallucinations, memory loss and substance abuse. The patient is not nervous/anxious and does not have insomnia.     Blood pressure 153/91, pulse 78, temperature 97 F (36.1 C), temperature source  Oral, resp. rate 18, height 5\' 8"  (1.727 m), weight 112.946 kg (249 lb).Body mass index is 37.87 kg/(m^2).  General Appearance: Casual  Eye Contact::  Fair  Speech:  Pressured  Volume:  Increased  Mood:  Less anxious less labile, but not yet euthymic  Affect:  brighter  Thought Process:  Circumstantial  Orientation:  Full (Time, Place, and Person)  Thought Content:  Negative  Suicidal Thoughts:  No  Homicidal Thoughts:  No  Memory:  NA Immediate;   Fair  Judgement:  Impaired  Insight:  Lacking  Psychomotor Activity:  Normal  Concentration:  Poor  Recall:  Poor  Akathisia:  No  Handed:  Right  AIMS (if indicated):     Assets:  Communication Skills Desire for Improvement Housing  Sleep:  Number of Hours: 5.5   Current Medications: Current Facility-Administered Medications  Medication Dose Route Frequency Provider Last Rate Last Dose  . acetaminophen (TYLENOL) tablet 650 mg  650 mg Oral Q6H PRN Kerry Hough, PA-C   650 mg at 05/01/13 0815  . alum & mag hydroxide-simeth (MAALOX/MYLANTA) 200-200-20 MG/5ML suspension 30 mL  30 mL Oral Q4H PRN Kerry Hough, PA-C   30 mL at 05/01/13 2000  . benztropine (COGENTIN) tablet 1 mg  1 mg Oral BID [name], PA-C   1 mg at 05/03/13 1409  . famotidine (PEPCID) tablet 20 mg  20 mg Oral BID Kerry Hough, PA-C   20 mg at 05/03/13 0746  . lurasidone (LATUDA) tablet 40 mg  40 mg Oral QHS Kerry Hough, PA-C   40 mg at 05/02/13 2148  . magnesium hydroxide (MILK OF MAGNESIA) suspension 30 mL  30 mL Oral Daily PRN Kerry Hough, PA-C      . pantoprazole (  PROTONIX) EC tablet 40 mg  40 mg Oral Daily Kerry Hough, PA-C   40 mg at 05/03/13 0745  . sodium chloride (OCEAN) 0.65 % nasal spray 1 spray  1 spray Each Nare PRN Kerry Hough, PA-C      . traZODone (DESYREL) tablet 100 mg  100 mg Oral QHS PRN Kerry Hough, PA-C   100 mg at 05/02/13 2148    Lab Results: No results found for this or any previous visit (from the past 48  hour(s)).  Physical Findings: AIMS: Facial and Oral Movements Muscles of Facial Expression: None, normal Lips and Perioral Area: None, normal Jaw: None, normal Tongue: None, normal,Extremity Movements Upper (arms, wrists, hands, fingers): None, normal Lower (legs, knees, ankles, toes): None, normal, Trunk Movements Neck, shoulders, hips: None, normal, Overall Severity Severity of abnormal movements (highest score from questions above): None, normal Incapacitation due to abnormal movements: None, normal Patient's awareness of abnormal movements (rate only patient's report): No Awareness, Dental Status Current problems with teeth and/or dentures?: No Does patient usually wear dentures?: No  CIWA:  CIWA-Ar Total: 1 COWS:  COWS Total Score: 7  Treatment Plan Summary: Daily contact with patient to assess and evaluate symptoms and progress in treatment Medication management  Plan: Continue crisis management and stabilization.  Medication management: Continue Abilify 5mg  daily for mood stability and scheduled Cogentin.  Encouraged patient to attend groups and participate in group counseling sessions and activities.  Discharge plan in progress.  Continue current treatment plan.  Address health issues: Vitals reviewed and stable.  ELOS: D/C in AM as planned if no problems.  Medical Decision Making Problem Points:  Established problem, stable/improving (1), New problem, with no additional work-up planned (3) and Review of psycho-social stressors (1) Data Points:  Review or order medicine tests (1)  I certify that inpatient services furnished can reasonably be expected to improve the patient's condition.  Rona Ravens. Audianna Landgren RPAC 11:03 AM 05/05/2013

## 2013-05-05 NOTE — Progress Notes (Signed)
Adult Psychoeducational Group Note  Date:  05/05/2013 Time:  1:15PM  Group Topic/Focus:  Identifying Needs:   The focus of this group is to help patients identify their personal needs that have been historically problematic and identify healthy behaviors to address their needs.  Participation Level:  Active  Participation Quality:  Appropriate  Affect:  Appropriate  Cognitive:  Appropriate  Insight: Appropriate  Engagement in Group:  Engaged  Modes of Intervention:  Activity and Discussion  Additional Comments:  Pt was an active participant in the group session as Staff discussed the importance of a healthy support system. Pt also engaged in a therapeutic activity where they learned their personal love language.  [name] R 05/05/2013, 5:15 PM

## 2013-05-05 NOTE — Progress Notes (Addendum)
Patient ID: Sean Hill, male   DOB: 01-Apr-1941, 72 y.o.   MRN: [id number] D)  Has been in the dayroom most of the evening, talking with select peers, one in particular who is near his age.  Did c/o some discomfort in his feet from walking on the hard floors here, is wearing his shoes and had plastic tabs in them to help keep them on, but states he doesn't get the same kind of support he does when he is able to have his shoe strings.  Has been pleasant and cooperative, talked about his overdose again this evening and how impulsive it had been, trying to understand why.  States before it had always been planned, etc.  And felt it gave him a sense of control, that if anything too bad happened he could always end it with suicide.   A) Was encouraged to further analyze his overdose and what he had said, as he had done this after arguing with his wife.  Will continue to monitor for safety, continue POC, support. R)  Remains safe on unit at this time, trying to gain some insight.

## 2013-05-05 NOTE — Progress Notes (Signed)
Psychoeducational Group Note  Date:  05/05/2013 Time:  1015  Group Topic/Focus:  Making Healthy Choices:   The focus of this group is to help patients identify negative/unhealthy choices they were using prior to admission and identify positive/healthier coping strategies to replace them upon discharge.  Participation Level:  Active  Participation Quality:  Attentive  Affect:  Appropriate  Cognitive:  Appropriate  Insight:  Engaged  Engagement in Group:  Engaged  Additional Comments:    Zaniah Titterington A 05/05/2013 

## 2013-05-05 NOTE — [facility] Group Notes (Signed)
Adult Psychoeducational Group Note  Date:  05/05/2013 Time:  11:47 PM  Group Topic/Focus:  Wrap-Up Group:   The focus of this group is to help patients review their daily goal of treatment and discuss progress on daily workbooks.  Participation Level:  Active  Participation Quality:  Appropriate, Attentive, Sharing and Supportive  Affect:  Depressed and Flat  Cognitive:  Alert and Appropriate  Insight: Improving  Engagement in Group:  Engaged and Improving  Modes of Intervention:  Discussion, Exploration and Support  Additional Comments:  Pt attended wrap up group this evening.  Pt is still actively working on his goal to find out what his triggers are for SI thoughts in the past.  He states he is currently no longer SI after his attempt on 04/30/13.  His worked in his workbook and can identify supports including his therapist, psychiatrist, wife and children.  However, he states he is not confident in ability of his psychiatrist who knew about SI and did not work with him on these thoughts.  Underwriter offered possible referrals, pt refused.  Pt said that hopefully his psychiatrist would listen and be more attentive to his thoughts and emotions.  Aundria Rud, Jermell Holeman L 05/05/2013, 11:47 PM

## 2013-05-05 NOTE — Progress Notes (Signed)
Patient ID: Sean Hill, male   DOB: 28-Aug-1941, 72 y.o.   MRN: [id number] D: Pt is awake and active on the unit this AM. Pt denies SI/HI and A/V hallucinations. Pt rates their depression at 1 and hopelessness at 0. Pt's mood is appropriate and his affect is sad. Pt writes that he plans to take his medications appropriately. Pt states that he is having great success in using his "pause button" and not react inappropriately to situations in the milleu. He is amazed at his own ability to avoid conflict and make better decisions. Writer encouraged pt to continue his practice now and after discharge in order to improve his life circumstances.   A: Encouraged pt to discuss feelings with staff and administered medication per MD orders. Writer also encouraged pt to participate in groups.  R: Pt is attending groups and tolerating medications well. Writer will continue to monitor. 15 minute checks are ongoing for safety.

## 2013-05-06 DIAGNOSIS — F3160 Bipolar disorder, current episode mixed, unspecified: Principal | ICD-10-CM

## 2013-05-06 DIAGNOSIS — F1910 Other psychoactive substance abuse, uncomplicated: Secondary | ICD-10-CM

## 2013-05-06 MED ORDER — OMEPRAZOLE 40 MG PO CPDR: 40.0000 mg | DELAYED_RELEASE_CAPSULE | Freq: Two times a day (BID) | ORAL | Status: DC

## 2013-05-06 MED ORDER — TRAZODONE HCL 100 MG PO TABS: 100.0000 mg | ORAL_TABLET | Freq: Every evening | ORAL | Status: DC | PRN

## 2013-05-06 MED ORDER — ARIPIPRAZOLE 5 MG PO TABS: 5.0000 mg | ORAL_TABLET | Freq: Every day | ORAL | Status: DC

## 2013-05-06 MED ORDER — BENZTROPINE MESYLATE 1 MG PO TABS: 1.0000 mg | ORAL_TABLET | Freq: Two times a day (BID) | ORAL | Status: DC

## 2013-05-06 NOTE — [facility] Group Notes (Signed)
[facility] LCSW Aftercare Discharge Planning Group Note   05/06/2013 8:45 AM  Participation Quality:  Alert and Appropriate   Mood/Affect:  Appropriate and Calm  Depression Rating:  0  Anxiety Rating:  0  Thoughts of Suicide:  Pt denies SI/HI  Will you contract for safety?   Yes  Current AVH: Pt denies  Plan for Discharge/Comments:  Pt attended discharge planning group and actively participated in group.  CSW provided pt with today's workbook.  Pt reports feeling well today and ready to d/c.  Pt will return home in [place].  Pt has follow up scheduled at [facility] for medication management and [facility] for therapy and IOP.  No further needs voiced by pt at this time.    Transportation Means: Pt has access to transportation  Supports: No supports mentioned at this time  Sean Hill, LCSWA 05/06/2013 10:02 AM

## 2013-05-06 NOTE — Discharge Summary (Signed)
Physician Discharge Summary Note  Patient:  Sean Hill is an 72 y.o., male MRN:  [id number] DOB:  November 02, 1940 Patient phone:  [phone] (home)  Patient address:   [address] [place] [address],   Date of Admission:  04/30/2013 Date of Discharge: 05/06/2013  Reason for Admission:  Suicide attempt  Discharge Diagnoses: Principal Problem:   Bipolar disorder Active Problems:   Drug overdose  Review of Systems  Constitutional: Negative.  Negative for fever, chills, weight loss, malaise/fatigue and diaphoresis.  HENT: Negative for congestion and sore throat.   Eyes: Negative for blurred vision, double vision and photophobia.  Respiratory: Negative for cough, shortness of breath and wheezing.   Cardiovascular: Negative for chest pain, palpitations and PND.  Gastrointestinal: Negative for heartburn, nausea, vomiting, abdominal pain, diarrhea and constipation.  Musculoskeletal: Negative for myalgias, joint pain and falls.  Neurological: Negative for dizziness, tingling, tremors, sensory change, speech change, focal weakness, seizures, loss of consciousness, weakness and headaches.  Endo/Heme/Allergies: Negative for polydipsia. Does not bruise/bleed easily.  Psychiatric/Behavioral: Negative for depression, suicidal ideas, hallucinations, memory loss and substance abuse. The patient is not nervous/anxious and does not have insomnia.   Discharge Diagnoses:  AXIS I: Bipolar, mixed and Substance Abuse  AXIS II: Borderline Personality Dis.  AXIS III:  Past Medical History   Diagnosis  Date   .  Bipolar 1 disorder, mixed    .  Anxiety    .  Depression     AXIS IV: other psychosocial or environmental problems, problems related to social environment and problems with primary support group  AXIS V: 51-60 moderate symptoms  Level of Care:  OP  Hospital Course:  Sean Hill was admitted from the medical floor after he attempted suicide with a significant overdose of Klonopin  requiring him to be in the ICU for 4 days.  Upon medical clearance he was transferred to [facility] for further stabilization and crisis management.     Upon arrival to the unit Sean Hill was evaluated and his symptoms were identified. Medication management was continued on the Jordan. He was felt to be manic in his presentation. He also voiced some concern that he became suicidal due to the Jordan. There was some question to his reliability and medical compliance and a long term IM antipsychotic was recommend. He agreed to this and Abilify Loni Dolly was given after an oral dose was given without side effects. His Latuda was slowly discontinued and the po Abilify ws continued.        Sean Hill responded well to the medication and supportive therapeutic millieu.        He was evaluated each day by a clinical provider to ascertain the patient's response to treatment.  Improvement was noted by the patient's report of decreasing symptoms, improved sleep and appetite, affect, medication tolerance, behavior, and participation in unit programming.   He was asked each day to complete a self inventory noting mood, mental status, pain, new symptoms, anxiety and concerns.        The patient responded well to medication and being in a therapeutic and supportive environment. Positive and appropriate behavior was noted and the patient was motivated for recovery. He worked closely with the treatment team and case manager to develop a discharge plan with appropriate goals. Coping skills, problem solving as well as relaxation therapies were also part of the unit programming.         By the day of discharge the patient was in much improved condition than  upon admission.  Symptoms were reported as significantly decreased or resolved completely.  The patient denied SI/HI and voiced no AVH. He was motivated to continue taking medication with a goal of continued improvement in mental health.  He was discharged home with a plan to follow up as  noted below. Consults:  None Significant Diagnostic Studies:  None  Discharge Vitals:   Blood pressure 132/82, pulse 92, temperature 97.7 F (36.5 C), temperature source Oral, resp. rate 18, height 5\' 8"  (1.727 m), weight 112.946 kg (249 lb). Body mass index is 37.87 kg/(m^2). Lab Results:   No results found for this or any previous visit (from the past 72 hour(s)).  Physical Findings: AIMS: Facial and Oral Movements Muscles of Facial Expression: None, normal Lips and Perioral Area: None, normal Jaw: None, normal Tongue: None, normal,Extremity Movements Upper (arms, wrists, hands, fingers): None, normal Lower (legs, knees, ankles, toes): None, normal, Trunk Movements Neck, shoulders, hips: None, normal, Overall Severity Severity of abnormal movements (highest score from questions above): None, normal Incapacitation due to abnormal movements: None, normal Patient's awareness of abnormal movements (rate only patient's report): No Awareness, Dental Status Current problems with teeth and/or dentures?: No Does patient usually wear dentures?: No  CIWA:  CIWA-Ar Total: 1 COWS:  COWS Total Score: 7  Psychiatric Specialty Exam: See Psychiatric Specialty Exam and Suicide Risk Assessment completed by Attending Physician prior to discharge.  Discharge destination:  Home  Is patient on multiple antipsychotic therapies at discharge:  No   Has Patient had three or more failed trials of antipsychotic monotherapy by history:  No  Recommended Plan for Multiple Antipsychotic Therapies: NA  Discharge Orders   Future Appointments Provider Department Dept Phone   06/18/2013 2:30 PM Carman Ching, LCSW [facility] [place] [phone]   Future Orders Complete By Expires   Diet - low sodium heart healthy  As directed    Discharge instructions  As directed    Comments:     Take all of your medications as directed. Be sure to keep all of your follow up  appointments.  If you are unable to keep your follow up appointment, call your Doctor's office to let them know, and reschedule.  Make sure that you have enough medication to last until your appointment. Be sure to get plenty of rest. Going to bed at the same time each night will help. Try to avoid sleeping during the day.  Increase your activity as tolerated. Regular exercise will help you to sleep better and improve your mental health. Eating a heart healthy diet is recommended. Try to avoid salty or fried foods. Be sure to avoid all alcohol and illegal drugs.   Increase activity slowly  As directed        Medication List    STOP taking these medications       LATUDA 40 MG Tabs tablet  Generic drug:  lurasidone     oxymetazoline 0.05 % nasal spray  Commonly known as:  AFRIN      TAKE these medications     Indication   ARIPiprazole 5 MG tablet  Commonly known as:  ABILIFY  Take 1 tablet (5 mg total) by mouth daily.   Indication:  Manic-Depression, Major Depressive Disorder     benztropine 1 MG tablet  Commonly known as:  COGENTIN  Take 1 tablet (1 mg total) by mouth 2 (two) times daily.   Indication:  Extrapyramidal Reaction caused by Medications     omeprazole 40  MG capsule  Commonly known as:  PRILOSEC  Take 1 capsule (40 mg total) by mouth 2 (two) times daily.   Indication:  Gastroesophageal Reflux Disease with Current Symptoms     traZODone 100 MG tablet  Commonly known as:  DESYREL  Take 1 tablet (100 mg total) by mouth at bedtime as needed for sleep.   Indication:  Trouble Sleeping, Major Depressive Disorder           Follow-up Information   Follow up with [facility] On 05/13/2013. (Arrive at 8:45 am to start IOP.  Groups are Monday - Friday 9 am - 12 pm)    Contact information:   [address] [place], [place] [address] [phone]      Follow up with [facility] On 05/21/2013. (Appointment scheduled at 1:45 pm  with [name], NP for medication management)    Contact information:   [address] [place], [place] [address] Phone: [phone] Fax: [phone]      Follow up with [facility] On 06/18/2013. (Appointment scheduled at 2:30 pm with Maxcine Ham for therapy)    Contact information:   [address] [place], [place] [address] [phone]      Follow-up recommendations:   Activities: Resume activity as tolerated. Diet: Heart healthy low sodium diet Tests: Follow up testing will be determined by your out patient provider.  Comments:    Total Discharge Time:  Greater than 30 minutes.  Signed: MASHBURN,NEIL 05/06/2013, 12:25 PM  Patient examined personally and developed treatment plan. Case discussed with treatment team. Reviewed the information documented and agree with the treatment plan.  Blakely Maranan,JANARDHAHA R. 05/07/2013 4:30 PM

## 2013-05-06 NOTE — Progress Notes (Signed)
[facility] Adult Case Management Discharge Plan :  Will you be returning to the same living situation after discharge: Yes,  returning home At discharge, do you have transportation home?:Yes,  access to transportation Do you have the ability to pay for your medications:Yes,  access to meds  Release of information consent forms completed and in the chart;  Patient's signature needed at discharge.  Patient to Follow up at: Follow-up Information   Follow up with [facility] On 05/13/2013. (Arrive at 8:45 am to start IOP.  Groups are Monday - Friday 9 am - 12 pm)    Contact information:   [address] [place], [place] [address] [phone]      Follow up with [facility] On 05/21/2013. (Appointment scheduled at 1:45 pm with [name], NP for medication management)    Contact information:   [address] [place], [place] [address] Phone: [phone] Fax: [phone]      Follow up with [facility] On 06/18/2013. (Appointment scheduled at 2:30 pm with Maxcine Ham for therapy)    Contact information:   [address] [place], [place] [address] [phone]      Patient denies SI/HI:   Yes,  denies SI/HI    Safety Planning and Suicide Prevention discussed:  Yes,  discussed with pt and pt's wife.  See suicide prevention education note.    Carmina Miller 05/06/2013, 10:22 AM

## 2013-05-06 NOTE — Progress Notes (Signed)
Discharge note: Pt denies SI/HI. Pt received both written and verbal discharge instructions, sample meds and prescriptions. Pt agreed to f/u appt and med regimen. Pt received all belongings for room. Pt left [facility] safely.
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
## 2013-05-06 NOTE — Progress Notes (Addendum)
D) Pt. Reports that he is not suicidal at this time, but "understands the importance of identifying support systems", in order to have resources for the future.  Pt. States he is "ready for d/c. And brightens in affect when speaking about leaving [facility].  A) Support given and pt. Teaching done re: accessing support system and keeping resources close at hand.  R) Pt. Receptive and "looking forward to d/c".

## 2013-05-06 NOTE — Progress Notes (Signed)
Patient ID: Sean Hill, male   DOB: 1941-04-14, 72 y.o.   MRN: [id number] D)  Has spent the evening in the dayroom talking to male peer close to his age, they seem to appreciate each other's company.  Has been pleasant, cooperative, attended group.  Affect seemed a little brighter this evening, only request was for trazodone tonight for sleep.  Still working on his triggers, trying to understand what caused him to react the way he did and does. A)  Support, encouragement, will continue to monitor for safety, continue POC R)  Safety maintained.

## 2013-05-06 NOTE — Tx Team (Signed)
Interdisciplinary Treatment Plan Update (Adult)  Date: 05/06/2013  Time Reviewed:  9:45 AM  Progress in Treatment: Attending groups: Yes Participating in groups:  Yes Taking medication as prescribed:  Yes Tolerating medication:  Yes Family/Significant othe contact made: Yes Patient understands diagnosis:  Yes Discussing patient identified problems/goals with staff:  Yes Medical problems stabilized or resolved:  Yes Denies suicidal/homicidal ideation: Yes Issues/concerns per patient self-inventory:  Yes Other:  New problem(s) identified: N/A  Discharge Plan or Barriers: Pt has follow up scheduled at [facility] for medication management and [facility] for therapy and IOP.    Reason for Continuation of Hospitalization: Stable to d/c  Comments: N/A  Estimated length of stay: D/C today  For review of initial/current patient goals, please see plan of care.  Attendees: Patient:  Sean Hill 05/06/2013 10:10 AM   Family:     Physician:  Dr. Javier Glazier 05/06/2013 10:08 AM   Nursing:   Dellia Cloud, RN 05/06/2013 10:08 AM   Clinical Social Worker:  Reyes Ivan, LCSWA 05/06/2013 10:08 AM   Other: [name], PA 05/06/2013 10:08 AM   Other:  Quintella Reichert, RN 05/06/2013 10:08 AM   Other:  Juline Patch, LCSW 05/06/2013 10:08 AM   Other:  Onnie Boer, RN case manager 05/06/2013 10:08 AM   Other:    Other:    Other:    Other:    Other:    Other:     Scribe for Treatment Team:   Carmina Miller, 05/06/2013 10:08 AM

## 2013-05-10 NOTE — Progress Notes (Signed)
Patient Discharge Instructions:  After Visit Summary (AVS):   Faxed to:  05/10/13 Discharge Summary Note:   Faxed to:  05/10/13 Psychiatric Admission Assessment Note:   Faxed to:  05/10/13 Suicide Risk Assessment - Discharge Assessment:   Faxed to:  05/10/13 Faxed/Sent to the Next Level Care provider:  05/10/13 Next Level Care Provider Has Access to the EMR, 05/10/13 Faxed to [facility] @ [phone] Records provided to [facility] via [facility]/Epic access.  Jerelene Redden, 05/10/2013, 1:18 PM

## 2013-05-13 ENCOUNTER — Encounter (HOSPITAL_COMMUNITY): Payer: Self-pay | Admitting: *Deleted

## 2013-05-13 ENCOUNTER — Other Ambulatory Visit (HOSPITAL_COMMUNITY): Payer: No Typology Code available for payment source

## 2013-05-13 NOTE — Progress Notes (Signed)
Cogentin authorized by [employer] 05/09/13 to 05/09/14 Notified pt and pharmacy.

## 2013-05-14 ENCOUNTER — Ambulatory Visit (HOSPITAL_COMMUNITY): Payer: Self-pay | Admitting: Psychiatry

## 2013-05-14 ENCOUNTER — Other Ambulatory Visit (HOSPITAL_COMMUNITY): Payer: No Typology Code available for payment source | Attending: Psychiatry

## 2013-05-15 ENCOUNTER — Other Ambulatory Visit (HOSPITAL_COMMUNITY): Payer: No Typology Code available for payment source

## 2013-05-16 ENCOUNTER — Telehealth (HOSPITAL_COMMUNITY): Payer: Self-pay | Admitting: *Deleted

## 2013-05-16 ENCOUNTER — Telehealth (HOSPITAL_COMMUNITY): Payer: Self-pay | Admitting: Psychiatry

## 2013-05-16 ENCOUNTER — Other Ambulatory Visit (HOSPITAL_COMMUNITY): Payer: No Typology Code available for payment source

## 2013-05-16 NOTE — Telephone Encounter (Signed)
Wife called him concerned about patient's condition.  He was discharged on August 18 with a followup to see this Clinical research associate on September 11.  I reviewed his discharge medication , He was given Abilify injection on August 15 and Abilify 5 mg at bedtime and trazodone 100 mg at bedtime.  Kathlene November is a concern about patient's sedation.  He is sleeping 18-20 hours a day.  I recommend to stop trazodone and Abilify since she only had Abilify injection given .  Recommended to call us back if does not improve .  Patient scheduled to see this writer on September 11.

## 2013-05-16 NOTE — Telephone Encounter (Signed)
Pt wife left VM with several concerns regarding his medication and wondering if adjustments are needed. Has an appointment on 9/11 with Dr. Lolly Mustache Patient very groggy, sleeping 20 hours out of 24.  Still on oral Abilify--changing over, after receiving injection while in hospital. Eyes and mouth very dry. Gets extremely anxious, especially at night around bedtime. They are leaving for beach on Saturday and pt anxious about trip

## 2013-05-17 ENCOUNTER — Other Ambulatory Visit (HOSPITAL_COMMUNITY): Payer: No Typology Code available for payment source

## 2013-05-21 ENCOUNTER — Other Ambulatory Visit (HOSPITAL_COMMUNITY): Payer: No Typology Code available for payment source

## 2013-05-22 ENCOUNTER — Other Ambulatory Visit (HOSPITAL_COMMUNITY): Payer: No Typology Code available for payment source

## 2013-05-23 ENCOUNTER — Ambulatory Visit (HOSPITAL_COMMUNITY): Payer: Self-pay | Admitting: Psychiatry

## 2013-05-23 ENCOUNTER — Other Ambulatory Visit (HOSPITAL_COMMUNITY): Payer: No Typology Code available for payment source

## 2013-05-24 ENCOUNTER — Other Ambulatory Visit (HOSPITAL_COMMUNITY): Payer: No Typology Code available for payment source

## 2013-05-27 ENCOUNTER — Other Ambulatory Visit (HOSPITAL_COMMUNITY): Payer: No Typology Code available for payment source

## 2013-05-28 ENCOUNTER — Other Ambulatory Visit (HOSPITAL_COMMUNITY): Payer: No Typology Code available for payment source

## 2013-05-29 ENCOUNTER — Other Ambulatory Visit (HOSPITAL_COMMUNITY): Payer: No Typology Code available for payment source

## 2013-05-30 ENCOUNTER — Encounter (HOSPITAL_COMMUNITY): Payer: Self-pay | Admitting: Psychiatry

## 2013-05-30 ENCOUNTER — Ambulatory Visit (INDEPENDENT_AMBULATORY_CARE_PROVIDER_SITE_OTHER): Payer: No Typology Code available for payment source | Admitting: Psychiatry

## 2013-05-30 ENCOUNTER — Other Ambulatory Visit (HOSPITAL_COMMUNITY): Payer: No Typology Code available for payment source

## 2013-05-30 VITALS — BP 140/81 | HR 88 | Ht 69.0 in | Wt 250.0 lb

## 2013-05-30 DIAGNOSIS — F319 Bipolar disorder, unspecified: Secondary | ICD-10-CM

## 2013-05-30 MED ORDER — ARIPIPRAZOLE ER 400 MG IM SUSR: 400.0000 mg | INTRAMUSCULAR | Status: DC

## 2013-05-30 MED ORDER — MIRTAZAPINE 15 MG PO TABS: 15.0000 mg | ORAL_TABLET | Freq: Every day | ORAL | Status: DC

## 2013-05-30 MED ORDER — BENZTROPINE MESYLATE 1 MG PO TABS: 1.0000 mg | ORAL_TABLET | Freq: Two times a day (BID) | ORAL | Status: DC

## 2013-05-30 NOTE — Progress Notes (Signed)
[facility] [id number] Progress Note  Sean Hill [id number] 72 y.o.  05/30/2013 2:30 PM  Chief Complaint:  I. was admitted to the behavioral Health Center after taking overdose on the medication.    History of Present Illness: Patient is 72 year old Caucasian retired man who came for his followup appointment.  Patient was admitted to behavioral Health Center from August 12 2 August 18.  This is his second psychiatric hospitalization in this unit.  Patient took overdose on Klonopin and latuda which was given by Har[facility] Hazel vigil at [facility] .  He require ICU treatment for 4 days and later transferred to behavioral Health Center for further stabilization.  Patient was last seen in this office on July 31 and he was recommended Zyprexa and Prozac but after a few days patient call us back , leaving messages that he does not want to try Zyprexa and Prozac.  He wants to see a different psychiatrist.  Patient went to see [facility] vigil however after a few days he overdosed on the medication.  Patient was given Abilify maintaina intramuscular on August 15 , recommended Abilify 5 mg daily and trazodone 100 mg at bedtime.  Later , patient's wife called and reported that patient is sleeping too much with the medication.  He was recommended to stop trazodone and Abilify since he already had injection of Abilify given .  Patient stopped Abilify but continued to take trazodone.  Now he is complaining of poor sleep and excessive dry mouth with trazodone.  He likes Abilify injection.  He denies any suicidal thoughts or homicidal thoughts .  He denies any hallucination and agitation .  He also reported not drinking alcohol since release from the hospital.  He has some socialization with his daughter who lives close.  The patient feel less depressed with Abilify.  He was to continue Abilify and Cogentin but like to try a different medication because of excessive dry mouth.  Patient is also concerned  about weight gain however he realized it could not be medication induced because there was a time when he was not taking medication he still has obesity.  Patient is thinking bariatric surgery.  Patient admitted sometime feeling sad and hopeless but denies any crying spells, mood swing or any hallucination.  He also denies any tremors or any shakes.  He is not drinking or using any illegal substance.  Despite the side effects he like to continue Abilify because it is helping his depression and bipolar disorder.  He sees [name] this office for coping and social skills.  Suicidal Ideation: No Plan Formed: No Patient has means to carry out plan: No  Homicidal Ideation: No Plan Formed: No Patient has means to carry out plan: No  Review of Systems  Constitutional: Positive for malaise/fatigue.  HENT: Positive for neck pain. Negative for hearing loss and tinnitus.   Gastrointestinal: Positive for heartburn.  Musculoskeletal: Positive for back pain.  Neurological: Negative.  Negative for headaches.  Psychiatric/Behavioral: Negative for hallucinations. The patient has insomnia.     Psychiatric: Agitation: Yes Hallucination: No Depressed Mood: Yes Insomnia: No Hypersomnia: No Altered Concentration: No Feels Worthless: Yes Grandiose Ideas: No Belief In Special Powers: No New/Increased Substance Abuse: Yes Compulsions: No  Neurologic: Headache: No Seizure: No Paresthesias: No  Medical History:  Patient has a history of high triglycerides, irritable bowel syndrome, chronic pain, GERD.  He denies any history of seizures blackouts, traumatic brain injury or any loss of consciousness.  Legal history.  The  patient has no current legal issues.   History of abuse.  Patient denies any history of physical sexual verbal and emotional abuse.   Medical history.  Patient has high triglycerides, irritable bowel syndrome, chronic pain, GERD. He is not taking medication for his high triglycerides.  Patient denies any history of seizures, blackouts, traumatic brain injury or any loss of consciousness.   Family history.  Patient endorse parent's were alcoholic, younger daughter suffers from depression, nephew died due to suicide. Daughter has been admitted at least twice to [facility] and once [facility].   Education and work history.  Patient has a high school education. Currently he is retired.   Alcohol and substance use history.  Patient admitted history of alcohol or any illegal substance use.  He admitted increased drinking and past few months.  Psychosocial history.  Patient is born and raised in [place]. His been married once. He has 2 daughters. Patient lives with his wife. His daughter lives close by. He has good relationship with his daughter.  Outpatient Encounter Prescriptions as of 05/30/2013  Medication Sig Dispense Refill  . benztropine (COGENTIN) 1 MG tablet Take 1 tablet (1 mg total) by mouth 2 (two) times daily.  60 tablet  0  . omeprazole (PRILOSEC) 40 MG capsule Take 1 capsule (40 mg total) by mouth 2 (two) times daily.  1 capsule  0  . [DISCONTINUED] benztropine (COGENTIN) 1 MG tablet Take 1 tablet (1 mg total) by mouth 2 (two) times daily.  60 tablet  0  . [DISCONTINUED] traZODone (DESYREL) 100 MG tablet Take 1 tablet (100 mg total) by mouth at bedtime as needed for sleep.  30 tablet  0  . ARIPiprazole (ABILIFY MAINTENA) 400 MG SUSR Inject 400 mg into the muscle every 30 (thirty) days.  1 each  0  . mirtazapine (REMERON) 15 MG tablet Take 1 tablet (15 mg total) by mouth at bedtime.  30 tablet  0  . [DISCONTINUED] ARIPiprazole (ABILIFY MAINTENA) 400 MG SUSR Inject 400 mg into the muscle every 30 (thirty) days.  1 each  0  . [DISCONTINUED] ARIPiprazole (ABILIFY) 5 MG tablet Take 1 tablet (5 mg total) by mouth daily.  30 tablet  0   No facility-administered encounter medications on file as of 05/30/2013.   Recent Results (from the past 2160 hour(s))   URINE RAPID DRUG SCREEN (HOSP PERFORMED)     Status: None   Collection Time    04/23/13  6:21 PM      Result Value Range   Opiates NONE DETECTED  NONE DETECTED   Cocaine NONE DETECTED  NONE DETECTED   Benzodiazepines NONE DETECTED  NONE DETECTED   Amphetamines NONE DETECTED  NONE DETECTED   Tetrahydrocannabinol NONE DETECTED  NONE DETECTED   Barbiturates NONE DETECTED  NONE DETECTED   Comment:            DRUG SCREEN FOR MEDICAL PURPOSES     ONLY.  IF CONFIRMATION IS NEEDED     FOR ANY PURPOSE, NOTIFY LAB     WITHIN 5 DAYS.                LOWEST DETECTABLE LIMITS     FOR URINE DRUG SCREEN     Drug Class       Cutoff (ng/mL)     Amphetamine      1000     Barbiturate      200     Benzodiazepine   200  Tricyclics       300     Opiates          300     Cocaine          300     THC              50  URINALYSIS, ROUTINE W REFLEX MICROSCOPIC     Status: None   Collection Time    04/23/13  6:21 PM      Result Value Range   Color, Urine YELLOW  YELLOW   APPearance CLEAR  CLEAR   Specific Gravity, Urine 1.015  1.005 - 1.030   pH 5.5  5.0 - 8.0   Glucose, UA NEGATIVE  NEGATIVE mg/dL   Hgb urine dipstick NEGATIVE  NEGATIVE   Bilirubin Urine NEGATIVE  NEGATIVE   Ketones, ur NEGATIVE  NEGATIVE mg/dL   Protein, ur NEGATIVE  NEGATIVE mg/dL   Urobilinogen, UA 0.2  0.0 - 1.0 mg/dL   Nitrite NEGATIVE  NEGATIVE   Leukocytes, UA NEGATIVE  NEGATIVE   Comment: MICROSCOPIC NOT DONE ON URINES WITH NEGATIVE PROTEIN, BLOOD, LEUKOCYTES, NITRITE, OR GLUCOSE <1000 mg/dL.  ACETAMINOPHEN LEVEL     Status: None   Collection Time    04/23/13  6:30 PM      Result Value Range   Acetaminophen (Tylenol), Serum <15.0  10 - 30 ug/mL   Comment:            THERAPEUTIC CONCENTRATIONS VARY     SIGNIFICANTLY. A RANGE OF 10-30     ug/mL MAY BE AN EFFECTIVE     CONCENTRATION FOR MANY PATIENTS.     HOWEVER, SOME ARE BEST TREATED     AT CONCENTRATIONS OUTSIDE THIS     RANGE.     ACETAMINOPHEN  CONCENTRATIONS     >150 ug/mL AT 4 HOURS AFTER     INGESTION AND >50 ug/mL AT 12     HOURS AFTER INGESTION ARE     OFTEN ASSOCIATED WITH TOXIC     REACTIONS.  CBC WITH DIFFERENTIAL     Status: None   Collection Time    04/23/13  6:30 PM      Result Value Range   WBC 5.5  4.0 - 10.5 K/uL   RBC 5.51  4.22 - 5.81 MIL/uL   Hemoglobin 15.6  13.0 - 17.0 g/dL   HCT 16.1  09.6 - 04.5 %   MCV 81.3  78.0 - 100.0 fL   MCH 28.3  26.0 - 34.0 pg   MCHC 34.8  30.0 - 36.0 g/dL   RDW 40.9  81.1 - 91.4 %   Platelets 213  150 - 400 K/uL   Neutrophils Relative % 55  43 - 77 %   Neutro Abs 3.0  1.7 - 7.7 K/uL   Lymphocytes Relative 33  12 - 46 %   Lymphs Abs 1.8  0.7 - 4.0 K/uL   Monocytes Relative 10  3 - 12 %   Monocytes Absolute 0.5  0.1 - 1.0 K/uL   Eosinophils Relative 2  0 - 5 %   Eosinophils Absolute 0.1  0.0 - 0.7 K/uL   Basophils Relative 1  0 - 1 %   Basophils Absolute 0.1  0.0 - 0.1 K/uL  COMPREHENSIVE METABOLIC PANEL     Status: Abnormal   Collection Time    04/23/13  6:30 PM      Result Value Range   Sodium 135  135 - 145 mEq/L  Potassium 4.2  3.5 - 5.1 mEq/L   Comment: SLIGHT HEMOLYSIS   Chloride 102  96 - 112 mEq/L   CO2 24  19 - 32 mEq/L   Glucose, Bld 95  70 - 99 mg/dL   BUN 12  6 - 23 mg/dL   Creatinine, Ser 9.14  0.50 - 1.35 mg/dL   Calcium 9.1  8.4 - 78.2 mg/dL   Total Protein 6.4  6.0 - 8.3 g/dL   Albumin 3.4 (*) 3.5 - 5.2 g/dL   AST 31  0 - 37 U/L   ALT 22  0 - 53 U/L   Alkaline Phosphatase 85  39 - 117 U/L   Total Bilirubin 0.4  0.3 - 1.2 mg/dL   GFR calc non Af Amer 86 (*) >90 mL/min   GFR calc Af Amer >90  >90 mL/min   Comment:            The eGFR has been calculated     using the CKD EPI equation.     This calculation has not been     validated in all clinical     situations.     eGFR's persistently     <90 mL/min signify     possible Chronic Kidney Disease.  ETHANOL     Status: None   Collection Time    04/23/13  6:30 PM      Result Value Range    Alcohol, Ethyl (B) <11  0 - 11 mg/dL   Comment:            LOWEST DETECTABLE LIMIT FOR     SERUM ALCOHOL IS 11 mg/dL     FOR MEDICAL PURPOSES ONLY  SALICYLATE LEVEL     Status: Abnormal   Collection Time    04/23/13  6:30 PM      Result Value Range   Salicylate Lvl <2.0 (*) 2.8 - 20.0 mg/dL  POCT I-STAT, CHEM 8     Status: Abnormal   Collection Time    04/23/13  6:46 PM      Result Value Range   Sodium 136  135 - 145 mEq/L   Potassium 5.4 (*) 3.5 - 5.1 mEq/L   Chloride 106  96 - 112 mEq/L   BUN 16  6 - 23 mg/dL   Creatinine, Ser 9.56  0.50 - 1.35 mg/dL   Glucose, Bld 95  70 - 99 mg/dL   Calcium, Ion 2.13 (*) 1.13 - 1.30 mmol/L   TCO2 25  0 - 100 mmol/L   Hemoglobin 16.0  13.0 - 17.0 g/dL   HCT 08.6  57.8 - 46.9 %  MRSA PCR SCREENING     Status: Abnormal   Collection Time    04/23/13 10:04 PM      Result Value Range   MRSA by PCR POSITIVE (*) NEGATIVE   Comment:            The GeneXpert MRSA Assay (FDA     approved for NASAL specimens     only), is one component of a     comprehensive MRSA colonization     surveillance program. It is not     intended to diagnose MRSA     infection nor to guide or     monitor treatment for     MRSA infections.     RESULT CALLED TO, READ BACK BY AND VERIFIED WITH:     PARKER,A RN @ 2328 ON 08.05.2014 BY MCREYNOLDS,B  COMPREHENSIVE METABOLIC PANEL  Status: Abnormal   Collection Time    04/23/13 10:40 PM      Result Value Range   Sodium 135  135 - 145 mEq/L   Potassium 4.1  3.5 - 5.1 mEq/L   Comment: DELTA CHECK NOTED     REPEATED TO VERIFY   Chloride 102  96 - 112 mEq/L   CO2 24  19 - 32 mEq/L   Glucose, Bld 111 (*) 70 - 99 mg/dL   BUN 11  6 - 23 mg/dL   Creatinine, Ser 1.61  0.50 - 1.35 mg/dL   Calcium 8.8  8.4 - 09.6 mg/dL   Total Protein 6.0  6.0 - 8.3 g/dL   Albumin 3.3 (*) 3.5 - 5.2 g/dL   AST 23  0 - 37 U/L   ALT 20  0 - 53 U/L   Alkaline Phosphatase 82  39 - 117 U/L   Total Bilirubin 0.4  0.3 - 1.2 mg/dL   GFR calc  non Af Amer 86 (*) >90 mL/min   GFR calc Af Amer >90  >90 mL/min   Comment:            The eGFR has been calculated     using the CKD EPI equation.     This calculation has not been     validated in all clinical     situations.     eGFR's persistently     <90 mL/min signify     possible Chronic Kidney Disease.  MAGNESIUM     Status: None   Collection Time    04/23/13 10:40 PM      Result Value Range   Magnesium 1.9  1.5 - 2.5 mg/dL  PHOSPHORUS     Status: None   Collection Time    04/23/13 10:40 PM      Result Value Range   Phosphorus 3.9  2.3 - 4.6 mg/dL  CBC WITH DIFFERENTIAL     Status: None   Collection Time    04/23/13 10:40 PM      Result Value Range   WBC 6.7  4.0 - 10.5 K/uL   RBC 5.26  4.22 - 5.81 MIL/uL   Hemoglobin 14.7  13.0 - 17.0 g/dL   HCT 04.5  40.9 - 81.1 %   MCV 81.7  78.0 - 100.0 fL   MCH 27.9  26.0 - 34.0 pg   MCHC 34.2  30.0 - 36.0 g/dL   RDW 91.4  78.2 - 95.6 %   Platelets 199  150 - 400 K/uL   Neutrophils Relative % 73  43 - 77 %   Neutro Abs 4.8  1.7 - 7.7 K/uL   Lymphocytes Relative 19  12 - 46 %   Lymphs Abs 1.3  0.7 - 4.0 K/uL   Monocytes Relative 8  3 - 12 %   Monocytes Absolute 0.5  0.1 - 1.0 K/uL   Eosinophils Relative 1  0 - 5 %   Eosinophils Absolute 0.0  0.0 - 0.7 K/uL   Basophils Relative 0  0 - 1 %   Basophils Absolute 0.0  0.0 - 0.1 K/uL  APTT     Status: None   Collection Time    04/23/13 10:40 PM      Result Value Range   aPTT 31  24 - 37 seconds  PROTIME-INR     Status: None   Collection Time    04/23/13 10:40 PM      Result Value Range  Prothrombin Time 14.2  11.6 - 15.2 seconds   INR 1.12  0.00 - 1.49  TSH     Status: None   Collection Time    04/23/13 10:40 PM      Result Value Range   TSH 0.777  0.350 - 4.500 uIU/mL   Comment: Performed at [facility]  COMPREHENSIVE METABOLIC PANEL     Status: Abnormal   Collection Time    04/24/13  3:32 AM      Result Value Range   Sodium 135  135 - 145 mEq/L    Potassium 4.0  3.5 - 5.1 mEq/L   Chloride 102  96 - 112 mEq/L   CO2 25  19 - 32 mEq/L   Glucose, Bld 119 (*) 70 - 99 mg/dL   BUN 11  6 - 23 mg/dL   Creatinine, Ser 1.61  0.50 - 1.35 mg/dL   Calcium 9.0  8.4 - 09.6 mg/dL   Total Protein 6.1  6.0 - 8.3 g/dL   Albumin 3.4 (*) 3.5 - 5.2 g/dL   AST 23  0 - 37 U/L   ALT 18  0 - 53 U/L   Alkaline Phosphatase 81  39 - 117 U/L   Total Bilirubin 0.5  0.3 - 1.2 mg/dL   GFR calc non Af Amer 87 (*) >90 mL/min   GFR calc Af Amer >90  >90 mL/min   Comment:            The eGFR has been calculated     using the CKD EPI equation.     This calculation has not been     validated in all clinical     situations.     eGFR's persistently     <90 mL/min signify     possible Chronic Kidney Disease.  CBC     Status: None   Collection Time    04/24/13  3:32 AM      Result Value Range   WBC 8.9  4.0 - 10.5 K/uL   RBC 5.23  4.22 - 5.81 MIL/uL   Hemoglobin 14.8  13.0 - 17.0 g/dL   HCT 04.5  40.9 - 81.1 %   MCV 81.5  78.0 - 100.0 fL   MCH 28.3  26.0 - 34.0 pg   MCHC 34.7  30.0 - 36.0 g/dL   RDW 91.4  78.2 - 95.6 %   Platelets 201  150 - 400 K/uL  GLUCOSE, CAPILLARY     Status: Abnormal   Collection Time    04/24/13  7:59 AM      Result Value Range   Glucose-Capillary 101 (*) 70 - 99 mg/dL  GLUCOSE, CAPILLARY     Status: None   Collection Time    04/25/13  7:55 AM      Result Value Range   Glucose-Capillary 86  70 - 99 mg/dL  CK     Status: Abnormal   Collection Time    04/25/13  1:55 PM      Result Value Range   Total CK 2130 (*) 7 - 232 U/L  CBC     Status: None   Collection Time    04/26/13  5:30 AM      Result Value Range   WBC 9.4  4.0 - 10.5 K/uL   Comment: WHITE COUNT CONFIRMED ON SMEAR   RBC 5.56  4.22 - 5.81 MIL/uL   Hemoglobin 15.8  13.0 - 17.0 g/dL   HCT 21.3  08.6 -  52.0 %   MCV 81.1  78.0 - 100.0 fL   MCH 28.4  26.0 - 34.0 pg   MCHC 35.0  30.0 - 36.0 g/dL   RDW 16.1  09.6 - 04.5 %   Platelets 211  150 - 400 K/uL  BASIC  METABOLIC PANEL     Status: Abnormal   Collection Time    04/26/13  5:30 AM      Result Value Range   Sodium 138  135 - 145 mEq/L   Potassium 3.7  3.5 - 5.1 mEq/L   Chloride 102  96 - 112 mEq/L   CO2 25  19 - 32 mEq/L   Glucose, Bld 109 (*) 70 - 99 mg/dL   BUN 8  6 - 23 mg/dL   Creatinine, Ser 4.09  0.50 - 1.35 mg/dL   Calcium 9.2  8.4 - 81.1 mg/dL   GFR calc non Af Amer 84 (*) >90 mL/min   GFR calc Af Amer >90  >90 mL/min   Comment:            The eGFR has been calculated     using the CKD EPI equation.     This calculation has not been     validated in all clinical     situations.     eGFR's persistently     <90 mL/min signify     possible Chronic Kidney Disease.  GLUCOSE, CAPILLARY     Status: Abnormal   Collection Time    04/26/13  8:46 AM      Result Value Range   Glucose-Capillary 117 (*) 70 - 99 mg/dL   Comment 1 Notify RN    GLUCOSE, CAPILLARY     Status: Abnormal   Collection Time    04/27/13  8:31 AM      Result Value Range   Glucose-Capillary 114 (*) 70 - 99 mg/dL   Comment 1 Notify RN    CK     Status: Abnormal   Collection Time    04/27/13  8:41 AM      Result Value Range   Total CK 1604 (*) 7 - 232 U/L  GLUCOSE, CAPILLARY     Status: Abnormal   Collection Time    04/28/13  7:35 AM      Result Value Range   Glucose-Capillary 117 (*) 70 - 99 mg/dL  GLUCOSE, CAPILLARY     Status: None   Collection Time    04/29/13  7:51 AM      Result Value Range   Glucose-Capillary 96  70 - 99 mg/dL   Comment 1 Notify RN    GLUCOSE, CAPILLARY     Status: Abnormal   Collection Time    04/30/13  7:59 AM      Result Value Range   Glucose-Capillary 101 (*) 70 - 99 mg/dL    Past Psychiatric History/Hospitalization(s): Patient has at least 4-5 psychiatric hospitalization.  His last psychiatric admission was in July 2014 .  He has a history of taking overdose on his medication and mixing with alcohol. He has a history of cutting his wrist. He is taking his psychiatric  medication for past 8 years. In the past he has seen [name] and [name]. As per chart he has taken in the past Tegretol, Depakote, Ambien, Geodon, Abilify, lithium, Provigil, Zoloft, Neurontin , Lamictal, Wellbutrin , Risperdal and Prstiq.  He said he overdosed on Latuda. Patient do not recall very well the previous response of these  medication. However he felt taking all these medications together was making him very drowsy groggy and unable to function. Patient endorsed history of mania and severe impulsive behavior. He admitted history of buying unnecessary, getting speeding tickets and aggression.  Patient reported history of side effects due to polypharmacy.    Anxiety: Yes Bipolar Disorder: Yes Depression: Yes Mania: Yes Psychosis: Yes Schizophrenia: No Personality Disorder: No Hospitalization for psychiatric illness: Yes History of Electroconvulsive Shock Therapy: No Prior Suicide Attempts: Yes  Physical Exam: Constitutional:  BP 140/81  Pulse 88  Ht 5\' 9"  (1.753 m)  Wt 250 lb (113.399 kg)  BMI 36.9 kg/m2  General Appearance: well nourished and obese  Musculoskeletal: Strength & Muscle Tone: within normal limits Gait & Station: normal Patient leans: N/A  Psychiatric: Speech (describe rate, volume, coherence, spontaneity, and abnormalities if any): Slow but clear and coherent.  Thought Process (describe rate, content, abstract reasoning, and computation): Logical and goal-directed.  Associations: Relevant and Intact  Thoughts: normal  Mental Status: Orientation: oriented to person, place, time/date, situation, day of week, month of year and year Mood & Affect: depressed affect, anxiety and labile affect Attention Span & Concentration: Fair  Medical Decision Making (Choose Three): Established Problem, Stable/Improving (1), Review of Psycho-Social Stressors (1), Review or order clinical lab tests (1), Review and summation of old records (2), Review of Last  Therapy Session (1), Review of Medication Regimen & Side Effects (2) and Review of New Medication or Change in Dosage (2)  Assessment: Axis I: Bipolar disorder NOS  Axis II: Deferred  Axis III:  Patient Active Problem List   Diagnosis Date Noted  . Drug overdose 04/23/2013  . Respiratory failure with hypoxia 04/23/2013  . Acute encephalopathy 04/23/2013  . Gastroesophageal reflux disease 11/28/2011  . Bipolar disorder 11/25/2011    Axis IV: Moderate  Axis V: 60-65   Plan:  I review her discharge summary, present medication , psychosocial stressors and a sponsor to his medication.  I believe patient is doing much better on Abilify maintaina 400 mg IM .  Patient is due to get another injection a few days.  I will discontinue trazodone as patient is complaining of dry mouth .  In the past he had tried Ambien, Klonopin however due to the history of overdose on these medications and alcohol use we will defer these medication.  We will try Remeron 15 mg which he has never tried before.  I explained the risks and benefits of medication especially metabolic side effects .  Recommend to see therapist for coping and social skills.  I also recommend to see his primary care physician to get a referral for bariatric surgery .  Patient is scheduled to see nurse on Monday to give injection .  I will see him again in 2-3 weeks.  Time spent 25 minutes.  More than 50% of the time spent in psychoeducation, counseling and coordination of care.  Discuss safety plan that anytime having active suicidal thoughts or homicidal thoughts then patient need to call 911 or go to the local emergency room.  Lincon Sahlin T., MD 05/30/2013

## 2013-05-31 ENCOUNTER — Other Ambulatory Visit (HOSPITAL_COMMUNITY): Payer: No Typology Code available for payment source

## 2013-06-03 ENCOUNTER — Other Ambulatory Visit (HOSPITAL_COMMUNITY): Payer: No Typology Code available for payment source

## 2013-06-03 ENCOUNTER — Ambulatory Visit (INDEPENDENT_AMBULATORY_CARE_PROVIDER_SITE_OTHER): Payer: No Typology Code available for payment source | Admitting: *Deleted

## 2013-06-03 VITALS — BP 135/96 | HR 89 | Ht 69.0 in

## 2013-06-03 DIAGNOSIS — F319 Bipolar disorder, unspecified: Secondary | ICD-10-CM

## 2013-06-03 MED ORDER — ARIPIPRAZOLE ER 400 MG IM SUSR
400.0000 mg | INTRAMUSCULAR | Status: DC
  Administered 2013-06-03: 400 mg via INTRAMUSCULAR

## 2013-06-03 NOTE — Progress Notes (Signed)
Patient here for injection of Abilify Maintena.  Patient states mood has been good since discharge from hospital.  States his only problem with this medicine is the severe dry mouth,using Biotene and drinking lots of fluids during the day. States it is a problem sleeping more than two hours at a time, the dry mouth wakes him up at night about every two hours.  Advised him MD would be informed.

## 2013-06-11 ENCOUNTER — Telehealth (HOSPITAL_COMMUNITY): Payer: Self-pay | Admitting: *Deleted

## 2013-06-11 MED ORDER — TRAZODONE HCL 100 MG PO TABS: 50.0000 mg | ORAL_TABLET | Freq: Every day | ORAL | Status: DC

## 2013-06-11 NOTE — Telephone Encounter (Signed)
Contacted pt/wife per Dr. Sheela Stack instructions.  Per Dr.Arfeen, stop Remeron and start Trazodone 50 mg, take 1/2 to 1 tablet at bedtime. Spouse reports pt has taken Trazodone in past and still has some 100 mg tablets.  Instructed pt per Dr. Lolly Mustache to take 1/2 of 100 mg tablet and let provider know in a few days if it is helpful. Spouse verbalized understanding.

## 2013-06-12 ENCOUNTER — Telehealth (HOSPITAL_COMMUNITY): Payer: Self-pay | Admitting: Psychiatry

## 2013-06-12 ENCOUNTER — Telehealth (HOSPITAL_COMMUNITY): Payer: Self-pay | Admitting: *Deleted

## 2013-06-12 MED ORDER — HYDROXYZINE PAMOATE 50 MG PO CAPS: ORAL_CAPSULE | ORAL | Status: DC

## 2013-06-12 NOTE — Telephone Encounter (Signed)
Patient left VM:He has been on Trazodone in past.Did not help in past and he does not want to try again.When wife called yesterday, she did not know this,or that it was not effective.  Is there something he can take to help sleep as he is not sleeping

## 2013-06-12 NOTE — Telephone Encounter (Signed)
I returned patient's phone call.  He is complaining of poor sleep.  He had tried Remeron and recently trazodone without any help.  We will try Vistaril 50 mg one to 2 tablet at bedtime for insomnia.  Recommend to call us back if he does not feel any improvement.

## 2013-06-18 ENCOUNTER — Ambulatory Visit (INDEPENDENT_AMBULATORY_CARE_PROVIDER_SITE_OTHER): Payer: No Typology Code available for payment source | Admitting: Psychiatry

## 2013-06-18 DIAGNOSIS — F319 Bipolar disorder, unspecified: Secondary | ICD-10-CM

## 2013-06-18 NOTE — Progress Notes (Signed)
   THERAPIST PROGRESS NOTE  Session Time: 2:30-3:30 pm  Participation Level: Active  Behavioral Response: Casual, Neat and Well GroomedAlertEuthymic  Type of Therapy: Individual Therapy  Treatment Goals addressed: Coping  Interventions: CBT and Supportive  Summary: Sean Hill is a 72 y.o. male. This is Pts first session with Clinical research associate since he cancelled all his therapy appointments and attempted suicide by overdose during a manic episode. Pt reports stable mood and increased ability to control his moods. He expressed dissatisfaction about side affects from the medications, but denied suicidal thoughts. Pt said he wants to have his wife attend his next appointment to discuss triggers and make a crisis plan for what to do if Pt gets manic again and is not able to control his own wellness and needs intervention. Pt agrees he needs an activity outside the home daily to engage in that will distract away from him focusing on his side affects and his illness.   Suicidal/Homicidal: Nowithout intent/plan  Therapist Response: Assessed overall level of functioning per Pt self report and learned about Pts hospital experience, reviewed triggers and discussed creating a crisis plan.   Plan: Return again in 2 weeks. Review triggers an create crisis plan with Pts wife present at next session.   Diagnosis: Axis I: Bipolar Disorder    Axis II: No diagnosis    Sean Hill E, LCSW 06/18/2013

## 2013-06-20 ENCOUNTER — Encounter (HOSPITAL_COMMUNITY): Payer: Self-pay | Admitting: Psychiatry

## 2013-06-20 ENCOUNTER — Ambulatory Visit (INDEPENDENT_AMBULATORY_CARE_PROVIDER_SITE_OTHER): Payer: No Typology Code available for payment source | Admitting: Psychiatry

## 2013-06-20 VITALS — BP 125/76 | HR 80 | Ht 69.0 in | Wt 258.0 lb

## 2013-06-20 DIAGNOSIS — F319 Bipolar disorder, unspecified: Secondary | ICD-10-CM

## 2013-06-20 MED ORDER — ARIPIPRAZOLE ER 400 MG IM SUSR: 400.0000 mg | INTRAMUSCULAR | Status: DC

## 2013-06-20 MED ORDER — BENZTROPINE MESYLATE 1 MG PO TABS: 1.0000 mg | ORAL_TABLET | Freq: Two times a day (BID) | ORAL | Status: DC

## 2013-06-20 MED ORDER — DIVALPROEX SODIUM 250 MG PO DR TAB: DELAYED_RELEASE_TABLET | ORAL | Status: DC

## 2013-06-20 NOTE — Progress Notes (Signed)
[facility] [id number] Progress Note  MARTEZE VECCHIO [id number] 72 y.o.  06/20/2013 1:45 PM  Chief Complaint:  I have difficulty falling asleep.  I tried Remeron and Vistaril but they don't help.  I'm start taking trazodone 100 mg and getting only 2 hours sleep.    History of Present Illness: Patient is 72 year old Caucasian retired man who came for his followup appointment.  Patient is now taking Abilify maintaina 400 mg IM every month.  Patient is doing much better with Abilify .  He endorsed much improvement in his depression, irritability, anger and suicidal thinking however he continued to endorse poor sleep .  He believed Abilify is causing insomnia .  Initially we tried Cogentin and later he added trazodone and Remeron and recently Vistaril.  Patient has seen no improvement with Vistaril and Remeron.  He start taking trazodone 100 mg .  He sleeping only 2-3 hours.  He is taking Cogentin and denies any tremors or shakes.  He does not want to change Abilify because it is helping his mood and depression.  Patient has taken multiple medications in the past however he has seen much improvement with Abilify injection.  He complained dry mouth and drinking water to avoid sedation and dry mouth.  He has no tremors or shakes.  He is also concerned about his weight and had consulted bariatric surgery however due to his age he is not a candidate for surgery.  His thinking to lose weight by doing regular exercise and watching his calorie intake.  Like to continue Abilify because he is not depressed and not having any crying spells, hallucination paranoia or any suicidal thinking.  He is seeing therapist in this office for coping and social skills.   Suicidal Ideation: No Plan Formed: No Patient has means to carry out plan: No  Homicidal Ideation: No Plan Formed: No Patient has means to carry out plan: No  Review of Systems  HENT: Negative.   Gastrointestinal: Positive for heartburn.   Musculoskeletal: Positive for back pain.  Neurological: Negative.   Psychiatric/Behavioral: Negative for hallucinations. The patient has insomnia.     Psychiatric: Agitation: Yes Hallucination: No Depressed Mood: Yes Insomnia: No Hypersomnia: No Altered Concentration: No Feels Worthless: Yes Grandiose Ideas: No Belief In Special Powers: No New/Increased Substance Abuse: Yes Compulsions: No  Neurologic: Headache: No Seizure: No Paresthesias: No  Medical History:  Patient has a history of high triglycerides, irritable bowel syndrome, chronic pain, GERD.  He denies any history of seizures blackouts, traumatic brain injury or any loss of consciousness.  Legal history.  The patient has no current legal issues.   History of abuse.  Patient denies any history of physical sexual verbal and emotional abuse.   Medical history.  Patient has high triglycerides, irritable bowel syndrome, chronic pain, GERD. He is not taking medication for his high triglycerides. Patient denies any history of seizures, blackouts, traumatic brain injury or any loss of consciousness.   Family history.  Patient endorse parent's were alcoholic, younger daughter suffers from depression, nephew died due to suicide. Daughter has been admitted at least twice to [facility] and once [facility].   Education and work history.  Patient has a high school education. Currently he is retired.   Alcohol and substance use history.  Patient admitted history of alcohol or any illegal substance use.  He admitted increased drinking and past few months.  Psychosocial history.  Patient is born and raised in [place]. His been married once.  He has 2 daughters. Patient lives with his wife. His daughter lives close by. He has good relationship with his daughter.   Past Psychiatric History/Hospitalization(s): Patient has at least 4-5 psychiatric hospitalization.  His last psychiatric admission was in July  2014 .  He has a history of taking overdose on his medication and mixing with alcohol. He has a history of cutting his wrist. He is taking his psychiatric medication for past 8 years. In the past he has seen [name] and [name]. As per chart he has taken in the past Tegretol, Depakote, Ambien, Geodon, Abilify, lithium, Provigil, Zoloft, Neurontin , Lamictal, Wellbutrin , Risperdal and Prstiq.  He said he overdosed on Latuda. Patient do not recall very well the previous response of these medication. However he felt taking all these medications together was making him very drowsy groggy and unable to function. Patient endorsed history of mania and severe impulsive behavior. He admitted history of buying unnecessary, getting speeding tickets and aggression.  Patient reported history of side effects due to polypharmacy.  Recently he had tried Remeron and Vistaril for insomnia but did not help him.  Anxiety: Yes Bipolar Disorder: Yes Depression: Yes Mania: Yes Psychosis: Yes Schizophrenia: No Personality Disorder: No Hospitalization for psychiatric illness: Yes History of Electroconvulsive Shock Therapy: No Prior Suicide Attempts: Yes  Physical Exam: Constitutional:  BP 125/76  Pulse 80  Ht 5\' 9"  (1.753 m)  Wt 258 lb (117.028 kg)  BMI 38.08 kg/m2  General Appearance: well nourished and obese  Musculoskeletal: Strength & Muscle Tone: within normal limits Gait & Station: normal Patient leans: N/A  Psychiatric: Speech (describe rate, volume, coherence, spontaneity, and abnormalities if any): Slow but clear and coherent.  Thought Process (describe rate, content, abstract reasoning, and computation): Logical and goal-directed.  Associations: Relevant and Intact  Thoughts: normal  Mental Status: Orientation: oriented to person, place, time/date, situation, day of week, month of year and year Mood & Affect: depressed affect, anxiety and labile affect Attention Span & Concentration:  Fair  Medical Decision Making (Choose Three): Established Problem, Stable/Improving (1), Review of Psycho-Social Stressors (1), Review of Last Therapy Session (1), Review of Medication Regimen & Side Effects (2) and Review of New Medication or Change in Dosage (2)  Assessment: Axis I: Bipolar disorder NOS  Axis II: Deferred  Axis III:  Patient Active Problem List   Diagnosis Date Noted  . Drug overdose 04/23/2013  . Respiratory failure with hypoxia 04/23/2013  . Acute encephalopathy 04/23/2013  . Gastroesophageal reflux disease 11/28/2011  . Bipolar disorder 11/25/2011    Axis IV: Moderate  Axis V: 60-65   Plan:  I review his current medication, blood results and response to his medication.  I will discontinue Vistaril suspicion is no longer taking it.  I recommend to try Depakote 250 mg 1-2 tablets at bedtime for insomnia and mood lability.  Recommend to use trazodone only if cannot sleep with Depakote.  I also discussed with ECT option since patient had tried multiple medications with limited response.  Patient believes his insomnia is due to Abilify, I recommended if he continues to have insomnia despite above recommendation then we should discuss further for ECT.  I recommend to read about ECT option.  Discussed in detail the risks and benefits of medication.  Patient has tried Depakote in the past but do not remember the response very well.  Recommend to see therapist for coping and social skills.  I will see him again in 4 weeks.  Time spent 25 minutes.  More than 50% of the time spent in psychoeducation, counseling and coordination of care.  Discuss safety plan that anytime having active suicidal thoughts or homicidal thoughts then patient need to call 911 or go to the local emergency room.  ARFEEN,SYED T., MD 06/20/2013

## 2013-06-22 ENCOUNTER — Other Ambulatory Visit: Payer: Self-pay | Admitting: Internal Medicine

## 2013-06-24 ENCOUNTER — Telehealth (HOSPITAL_COMMUNITY): Payer: Self-pay | Admitting: *Deleted

## 2013-06-24 ENCOUNTER — Telehealth (HOSPITAL_COMMUNITY): Payer: Self-pay | Admitting: Psychiatry

## 2013-06-24 DIAGNOSIS — G4700 Insomnia, unspecified: Secondary | ICD-10-CM

## 2013-06-24 MED ORDER — TEMAZEPAM 15 MG PO CAPS: 15.0000 mg | ORAL_CAPSULE | Freq: Every evening | ORAL | Status: DC | PRN

## 2013-06-24 NOTE — Telephone Encounter (Signed)
Patient called and continued to complain about insomnia.  He sleeping only 2 hours with Depakote.  Recommend temazepam 15 mg capsule for insomnia.  Patient has taken benzodiazepines in the past with good response however he had overdosed on Klonopin in the past.  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  If temazepam does not help him we will consider ECT treatment since patient had tried multiple medication and he believes that Abilify is causing insomnia.  I also recommend to call us back if he has any further questions.

## 2013-06-24 NOTE — Telephone Encounter (Signed)
Pt left WU:Sean Hill is still keeping him awake at night.Sleeping only a few hours at a time.Wants to discuss proceeding with ECT. Requests call from provider

## 2013-07-01 ENCOUNTER — Ambulatory Visit (HOSPITAL_COMMUNITY): Payer: Self-pay | Admitting: Psychiatry

## 2013-07-02 ENCOUNTER — Ambulatory Visit (INDEPENDENT_AMBULATORY_CARE_PROVIDER_SITE_OTHER): Payer: No Typology Code available for payment source | Admitting: *Deleted

## 2013-07-02 DIAGNOSIS — F319 Bipolar disorder, unspecified: Secondary | ICD-10-CM

## 2013-07-02 MED ORDER — ARIPIPRAZOLE ER 400 MG IM SUSR: 400.0000 mg | Freq: Once | INTRAMUSCULAR | Status: DC

## 2013-07-02 NOTE — Progress Notes (Signed)
Injection given of Abilify Maintena. Lot #[id number], obtained medication from [facility] pharmacy. Box also had lot [phone]. Given in Left gluteal, upper outer quadrant. No complaints.

## 2013-07-09 ENCOUNTER — Ambulatory Visit (INDEPENDENT_AMBULATORY_CARE_PROVIDER_SITE_OTHER): Payer: No Typology Code available for payment source | Admitting: Psychiatry

## 2013-07-09 DIAGNOSIS — F319 Bipolar disorder, unspecified: Secondary | ICD-10-CM

## 2013-07-09 NOTE — Progress Notes (Signed)
   THERAPIST PROGRESS NOTE  Session Time: 3:30-4:30 pm  Participation Level: Active  Behavioral Response: CasualAlertEuthymic  Type of Therapy: Individual Therapy  Treatment Goals addressed: Coping  Interventions: Solution Focused  Summary: Sean Hill is a 72 y.o. male who presents with euthymic mood and affect and reports the medications working and allowing for stable mood without emotional outbursts. Pt reports his primary dissatisfaction with the side affects from the medications, specifically the dry mouth. Pt said he is still interested in pursuing ECT treatments and is looking into the at this week to discuss at his next session.   Suicidal/Homicidal: Nowithout intent/plan  Therapist Response: Assessed overall level of functioning and reviewed progress with symptoms. Discussed ECT as a possible treatment.   Plan: Return again in 1 weeks.  Diagnosis: Axis I: Bipolar disorder    Axis II: No diagnosis    Sean Krist E, LCSW 07/09/2013

## 2013-07-16 ENCOUNTER — Ambulatory Visit (INDEPENDENT_AMBULATORY_CARE_PROVIDER_SITE_OTHER): Payer: No Typology Code available for payment source | Admitting: Psychiatry

## 2013-07-16 DIAGNOSIS — F319 Bipolar disorder, unspecified: Secondary | ICD-10-CM

## 2013-07-16 NOTE — Progress Notes (Signed)
   THERAPIST PROGRESS NOTE  Session Time: 1:30-2:30 pm  Participation Level: Active  Behavioral Response: CasualAlertEuthymic  Type of Therapy: Family Therapy  Treatment Goals addressed: Coping  Interventions: CBT and Solution Focused  Summary: Sean Hill is a 72 y.o. male who presents with his wife Cordelia Pen) and states he has been feeling "good". Pt processed his stabilized mood but stated he continues to have mild impulsivity and irratability. Pt is looking forward to his ECT appointment and is awaiting he referral.     Suicidal/Homicidal: Nowithout intent/plan  Therapist Response: Assessed overall level of functioning and reviewed current symptoms. Discussed the process of ECT.  Plan: Return again in 1 weeks.  Diagnosis: Axis I: Bipolar Disorder    Axis II: No diagnosis    Elianny Buxbaum E, LCSW 07/16/2013

## 2013-07-18 ENCOUNTER — Ambulatory Visit (INDEPENDENT_AMBULATORY_CARE_PROVIDER_SITE_OTHER): Payer: No Typology Code available for payment source | Admitting: Psychiatry

## 2013-07-18 ENCOUNTER — Encounter (HOSPITAL_COMMUNITY): Payer: Self-pay | Admitting: Psychiatry

## 2013-07-18 VITALS — BP 142/83 | HR 86 | Ht 69.0 in | Wt 260.0 lb

## 2013-07-18 DIAGNOSIS — F319 Bipolar disorder, unspecified: Secondary | ICD-10-CM

## 2013-07-18 DIAGNOSIS — G4700 Insomnia, unspecified: Secondary | ICD-10-CM

## 2013-07-18 MED ORDER — TRAZODONE HCL 100 MG PO TABS: 50.0000 mg | ORAL_TABLET | Freq: Every day | ORAL | Status: DC

## 2013-07-18 MED ORDER — BENZTROPINE MESYLATE 1 MG PO TABS: 1.0000 mg | ORAL_TABLET | Freq: Two times a day (BID) | ORAL | Status: DC

## 2013-07-18 MED ORDER — TEMAZEPAM 15 MG PO CAPS: 15.0000 mg | ORAL_CAPSULE | Freq: Every evening | ORAL | Status: DC | PRN

## 2013-07-18 NOTE — Progress Notes (Signed)
[facility] [address] Progress Note  Sean Hill [id number] 72 y.o.  07/18/2013 2:27 PM  Chief Complaint:  I like temazepam but I still have insomnia some nights.  I have gained weight.  I'm thinking seriously about ECT.    History of Present Illness: Sean Hill came for his followup appointment.  We started him on temazepam 15 mg for insomnia.  He likes it because some of the night he is able to sleep.  However there are still sometimes he has difficulty falling asleep.  He continues to have irritability anger and mood swing however he denies any suicidal thoughts or homicidal thoughts.  His thinking seriously about ECT.  He has appointment with Dr. Mat Carne packs tomorrow for ECT evaluation.  We have a long discussion about ECT treatment and patient is thinking about it seriously.  He had tried many medication and he has side effects.  He likes the Abilify intramuscular which is helping his depression and suicidal thoughts however he continues to have manic-like symptoms.  He endorsed side effects including weight gain, insomnia, and dry mouth and wondering if he can come off from psychotropic medication.  He is seeing therapist.  He is not taking Depakote.  His tremors are much better with Cogentin 1 mg twice a day.  He still requires trazodone for insomnia.  He denies any hallucinations or any paranoia.  He is not drinking or using any little substance.  He is getting Abilify injection every month.   Suicidal Ideation: No Plan Formed: No Patient has means to carry out plan: No  Homicidal Ideation: No Plan Formed: No Patient has means to carry out plan: No  Review of Systems  HENT: Negative.   Gastrointestinal: Positive for heartburn.  Musculoskeletal: Positive for back pain.  Neurological: Negative.   Psychiatric/Behavioral: Negative for hallucinations. The patient has insomnia.     Psychiatric: Agitation: Yes Hallucination: No Depressed Mood: Yes Insomnia:  No Hypersomnia: No Altered Concentration: No Feels Worthless: No Grandiose Ideas: No Belief In Special Powers: No New/Increased Substance Abuse: No Compulsions: No  Neurologic: Headache: No Seizure: No Paresthesias: No  Medical History:  Patient has a history of high triglycerides, irritable bowel syndrome, chronic pain, GERD.  He denies any history of seizures blackouts, traumatic brain injury or any loss of consciousness.  Legal history.  The patient has no current legal issues.   History of abuse.  Patient denies any history of physical sexual verbal and emotional abuse.   Medical history.  Patient has high triglycerides, irritable bowel syndrome, chronic pain, GERD. He is not taking medication for his high triglycerides. Patient denies any history of seizures, blackouts, traumatic brain injury or any loss of consciousness.   Family history.  Patient endorse parent's were alcoholic, younger daughter suffers from depression, nephew died due to suicide. Daughter has been admitted at least twice to [facility] and once [facility].   Education and work history.  Patient has a high school education. Currently he is retired.   Alcohol and substance use history.  Patient admitted history of alcohol or any illegal substance use.  He admitted increased drinking and past few months.  Psychosocial history.  Patient is born and raised in [place]. His been married once. He has 2 daughters. Patient lives with his wife. His daughter lives close by. He has good relationship with his daughter.   Past Psychiatric History/Hospitalization(s): Patient has at least 4-5 psychiatric hospitalization.  His last psychiatric admission was in July 2014 .  He has a history of taking overdose on his medication and mixing with alcohol. He has a history of cutting his wrist. He is taking his psychiatric medication for past 8 years. In the past he has seen [name] and [name]. As per  chart he has taken in the past Tegretol, Depakote, Ambien, Geodon, Abilify, lithium, Provigil, Zoloft, Neurontin , Lamictal, Wellbutrin , Risperdal and Prstiq.  He said he overdosed on Latuda. Patient do not recall very well the previous response of these medication. However he felt taking all these medications together was making him very drowsy groggy and unable to function. Patient endorsed history of mania and severe impulsive behavior. He admitted history of buying unnecessary, getting speeding tickets and aggression.  Patient reported history of side effects due to polypharmacy.  Recently he had tried Remeron and Vistaril for insomnia but did not help him.  Anxiety: Yes Bipolar Disorder: Yes Depression: Yes Mania: Yes Psychosis: Yes Schizophrenia: No Personality Disorder: No Hospitalization for psychiatric illness: Yes History of Electroconvulsive Shock Therapy: No Prior Suicide Attempts: Yes  Physical Exam: Constitutional:  BP 142/83  Pulse 86  Ht 5\' 9"  (1.753 m)  Wt 260 lb (117.935 kg)  BMI 38.38 kg/m2  Recent Results (from the past 2160 hour(s))  URINE RAPID DRUG SCREEN (HOSP PERFORMED)     Status: None   Collection Time    04/23/13  6:21 PM      Result Value Range   Opiates NONE DETECTED  NONE DETECTED   Cocaine NONE DETECTED  NONE DETECTED   Benzodiazepines NONE DETECTED  NONE DETECTED   Amphetamines NONE DETECTED  NONE DETECTED   Tetrahydrocannabinol NONE DETECTED  NONE DETECTED   Barbiturates NONE DETECTED  NONE DETECTED   Comment:            DRUG SCREEN FOR MEDICAL PURPOSES     ONLY.  IF CONFIRMATION IS NEEDED     FOR ANY PURPOSE, NOTIFY LAB     WITHIN 5 DAYS.                LOWEST DETECTABLE LIMITS     FOR URINE DRUG SCREEN     Drug Class       Cutoff (ng/mL)     Amphetamine      1000     Barbiturate      200     Benzodiazepine   200     Tricyclics       300     Opiates          300     Cocaine          300     THC              50  URINALYSIS, ROUTINE W  REFLEX MICROSCOPIC     Status: None   Collection Time    04/23/13  6:21 PM      Result Value Range   Color, Urine YELLOW  YELLOW   APPearance CLEAR  CLEAR   Specific Gravity, Urine 1.015  1.005 - 1.030   pH 5.5  5.0 - 8.0   Glucose, UA NEGATIVE  NEGATIVE mg/dL   Hgb urine dipstick NEGATIVE  NEGATIVE   Bilirubin Urine NEGATIVE  NEGATIVE   Ketones, ur NEGATIVE  NEGATIVE mg/dL   Protein, ur NEGATIVE  NEGATIVE mg/dL   Urobilinogen, UA 0.2  0.0 - 1.0 mg/dL   Nitrite NEGATIVE  NEGATIVE   Leukocytes, UA NEGATIVE  NEGATIVE  Comment: MICROSCOPIC NOT DONE ON URINES WITH NEGATIVE PROTEIN, BLOOD, LEUKOCYTES, NITRITE, OR GLUCOSE <1000 mg/dL.  ACETAMINOPHEN LEVEL     Status: None   Collection Time    04/23/13  6:30 PM      Result Value Range   Acetaminophen (Tylenol), Serum <15.0  10 - 30 ug/mL   Comment:            THERAPEUTIC CONCENTRATIONS VARY     SIGNIFICANTLY. A RANGE OF 10-30     ug/mL MAY BE AN EFFECTIVE     CONCENTRATION FOR MANY PATIENTS.     HOWEVER, SOME ARE BEST TREATED     AT CONCENTRATIONS OUTSIDE THIS     RANGE.     ACETAMINOPHEN CONCENTRATIONS     >150 ug/mL AT 4 HOURS AFTER     INGESTION AND >50 ug/mL AT 12     HOURS AFTER INGESTION ARE     OFTEN ASSOCIATED WITH TOXIC     REACTIONS.  CBC WITH DIFFERENTIAL     Status: None   Collection Time    04/23/13  6:30 PM      Result Value Range   WBC 5.5  4.0 - 10.5 K/uL   RBC 5.51  4.22 - 5.81 MIL/uL   Hemoglobin 15.6  13.0 - 17.0 g/dL   HCT 16.1  09.6 - 04.5 %   MCV 81.3  78.0 - 100.0 fL   MCH 28.3  26.0 - 34.0 pg   MCHC 34.8  30.0 - 36.0 g/dL   RDW 40.9  81.1 - 91.4 %   Platelets 213  150 - 400 K/uL   Neutrophils Relative % 55  43 - 77 %   Neutro Abs 3.0  1.7 - 7.7 K/uL   Lymphocytes Relative 33  12 - 46 %   Lymphs Abs 1.8  0.7 - 4.0 K/uL   Monocytes Relative 10  3 - 12 %   Monocytes Absolute 0.5  0.1 - 1.0 K/uL   Eosinophils Relative 2  0 - 5 %   Eosinophils Absolute 0.1  0.0 - 0.7 K/uL   Basophils Relative 1  0  - 1 %   Basophils Absolute 0.1  0.0 - 0.1 K/uL  COMPREHENSIVE METABOLIC PANEL     Status: Abnormal   Collection Time    04/23/13  6:30 PM      Result Value Range   Sodium 135  135 - 145 mEq/L   Potassium 4.2  3.5 - 5.1 mEq/L   Comment: SLIGHT HEMOLYSIS   Chloride 102  96 - 112 mEq/L   CO2 24  19 - 32 mEq/L   Glucose, Bld 95  70 - 99 mg/dL   BUN 12  6 - 23 mg/dL   Creatinine, Ser 7.82  0.50 - 1.35 mg/dL   Calcium 9.1  8.4 - 95.6 mg/dL   Total Protein 6.4  6.0 - 8.3 g/dL   Albumin 3.4 (*) 3.5 - 5.2 g/dL   AST 31  0 - 37 U/L   ALT 22  0 - 53 U/L   Alkaline Phosphatase 85  39 - 117 U/L   Total Bilirubin 0.4  0.3 - 1.2 mg/dL   GFR calc non Af Amer 86 (*) >90 mL/min   GFR calc Af Amer >90  >90 mL/min   Comment:            The eGFR has been calculated     using the CKD EPI equation.     This calculation has  not been     validated in all clinical     situations.     eGFR's persistently     <90 mL/min signify     possible Chronic Kidney Disease.  ETHANOL     Status: None   Collection Time    04/23/13  6:30 PM      Result Value Range   Alcohol, Ethyl (B) <11  0 - 11 mg/dL   Comment:            LOWEST DETECTABLE LIMIT FOR     SERUM ALCOHOL IS 11 mg/dL     FOR MEDICAL PURPOSES ONLY  SALICYLATE LEVEL     Status: Abnormal   Collection Time    04/23/13  6:30 PM      Result Value Range   Salicylate Lvl <2.0 (*) 2.8 - 20.0 mg/dL  POCT I-STAT, CHEM 8     Status: Abnormal   Collection Time    04/23/13  6:46 PM      Result Value Range   Sodium 136  135 - 145 mEq/L   Potassium 5.4 (*) 3.5 - 5.1 mEq/L   Chloride 106  96 - 112 mEq/L   BUN 16  6 - 23 mg/dL   Creatinine, Ser 1.61  0.50 - 1.35 mg/dL   Glucose, Bld 95  70 - 99 mg/dL   Calcium, Ion 0.96 (*) 1.13 - 1.30 mmol/L   TCO2 25  0 - 100 mmol/L   Hemoglobin 16.0  13.0 - 17.0 g/dL   HCT 04.5  40.9 - 81.1 %  MRSA PCR SCREENING     Status: Abnormal   Collection Time    04/23/13 10:04 PM      Result Value Range   MRSA by PCR  POSITIVE (*) NEGATIVE   Comment:            The GeneXpert MRSA Assay (FDA     approved for NASAL specimens     only), is one component of a     comprehensive MRSA colonization     surveillance program. It is not     intended to diagnose MRSA     infection nor to guide or     monitor treatment for     MRSA infections.     RESULT CALLED TO, READ BACK BY AND VERIFIED WITH:     PARKER,A RN @ 2328 ON 08.05.2014 BY MCREYNOLDS,B  COMPREHENSIVE METABOLIC PANEL     Status: Abnormal   Collection Time    04/23/13 10:40 PM      Result Value Range   Sodium 135  135 - 145 mEq/L   Potassium 4.1  3.5 - 5.1 mEq/L   Comment: DELTA CHECK NOTED     REPEATED TO VERIFY   Chloride 102  96 - 112 mEq/L   CO2 24  19 - 32 mEq/L   Glucose, Bld 111 (*) 70 - 99 mg/dL   BUN 11  6 - 23 mg/dL   Creatinine, Ser 9.14  0.50 - 1.35 mg/dL   Calcium 8.8  8.4 - 78.2 mg/dL   Total Protein 6.0  6.0 - 8.3 g/dL   Albumin 3.3 (*) 3.5 - 5.2 g/dL   AST 23  0 - 37 U/L   ALT 20  0 - 53 U/L   Alkaline Phosphatase 82  39 - 117 U/L   Total Bilirubin 0.4  0.3 - 1.2 mg/dL   GFR calc non Af Amer 86 (*) >90 mL/min  GFR calc Af Amer >90  >90 mL/min   Comment:            The eGFR has been calculated     using the CKD EPI equation.     This calculation has not been     validated in all clinical     situations.     eGFR's persistently     <90 mL/min signify     possible Chronic Kidney Disease.  MAGNESIUM     Status: None   Collection Time    04/23/13 10:40 PM      Result Value Range   Magnesium 1.9  1.5 - 2.5 mg/dL  PHOSPHORUS     Status: None   Collection Time    04/23/13 10:40 PM      Result Value Range   Phosphorus 3.9  2.3 - 4.6 mg/dL  CBC WITH DIFFERENTIAL     Status: None   Collection Time    04/23/13 10:40 PM      Result Value Range   WBC 6.7  4.0 - 10.5 K/uL   RBC 5.26  4.22 - 5.81 MIL/uL   Hemoglobin 14.7  13.0 - 17.0 g/dL   HCT 40.9  81.1 - 91.4 %   MCV 81.7  78.0 - 100.0 fL   MCH 27.9  26.0 - 34.0 pg    MCHC 34.2  30.0 - 36.0 g/dL   RDW 78.2  95.6 - 21.3 %   Platelets 199  150 - 400 K/uL   Neutrophils Relative % 73  43 - 77 %   Neutro Abs 4.8  1.7 - 7.7 K/uL   Lymphocytes Relative 19  12 - 46 %   Lymphs Abs 1.3  0.7 - 4.0 K/uL   Monocytes Relative 8  3 - 12 %   Monocytes Absolute 0.5  0.1 - 1.0 K/uL   Eosinophils Relative 1  0 - 5 %   Eosinophils Absolute 0.0  0.0 - 0.7 K/uL   Basophils Relative 0  0 - 1 %   Basophils Absolute 0.0  0.0 - 0.1 K/uL  APTT     Status: None   Collection Time    04/23/13 10:40 PM      Result Value Range   aPTT 31  24 - 37 seconds  PROTIME-INR     Status: None   Collection Time    04/23/13 10:40 PM      Result Value Range   Prothrombin Time 14.2  11.6 - 15.2 seconds   INR 1.12  0.00 - 1.49  TSH     Status: None   Collection Time    04/23/13 10:40 PM      Result Value Range   TSH 0.777  0.350 - 4.500 uIU/mL   Comment: Performed at [facility]  COMPREHENSIVE METABOLIC PANEL     Status: Abnormal   Collection Time    04/24/13  3:32 AM      Result Value Range   Sodium 135  135 - 145 mEq/L   Potassium 4.0  3.5 - 5.1 mEq/L   Chloride 102  96 - 112 mEq/L   CO2 25  19 - 32 mEq/L   Glucose, Bld 119 (*) 70 - 99 mg/dL   BUN 11  6 - 23 mg/dL   Creatinine, Ser 0.86  0.50 - 1.35 mg/dL   Calcium 9.0  8.4 - 57.8 mg/dL   Total Protein 6.1  6.0 - 8.3 g/dL   Albumin 3.4 (*)  3.5 - 5.2 g/dL   AST 23  0 - 37 U/L   ALT 18  0 - 53 U/L   Alkaline Phosphatase 81  39 - 117 U/L   Total Bilirubin 0.5  0.3 - 1.2 mg/dL   GFR calc non Af Amer 87 (*) >90 mL/min   GFR calc Af Amer >90  >90 mL/min   Comment:            The eGFR has been calculated     using the CKD EPI equation.     This calculation has not been     validated in all clinical     situations.     eGFR's persistently     <90 mL/min signify     possible Chronic Kidney Disease.  CBC     Status: None   Collection Time    04/24/13  3:32 AM      Result Value Range   WBC 8.9  4.0 - 10.5 K/uL    RBC 5.23  4.22 - 5.81 MIL/uL   Hemoglobin 14.8  13.0 - 17.0 g/dL   HCT 16.1  09.6 - 04.5 %   MCV 81.5  78.0 - 100.0 fL   MCH 28.3  26.0 - 34.0 pg   MCHC 34.7  30.0 - 36.0 g/dL   RDW 40.9  81.1 - 91.4 %   Platelets 201  150 - 400 K/uL  GLUCOSE, CAPILLARY     Status: Abnormal   Collection Time    04/24/13  7:59 AM      Result Value Range   Glucose-Capillary 101 (*) 70 - 99 mg/dL  GLUCOSE, CAPILLARY     Status: None   Collection Time    04/25/13  7:55 AM      Result Value Range   Glucose-Capillary 86  70 - 99 mg/dL  CK     Status: Abnormal   Collection Time    04/25/13  1:55 PM      Result Value Range   Total CK 2130 (*) 7 - 232 U/L  CBC     Status: None   Collection Time    04/26/13  5:30 AM      Result Value Range   WBC 9.4  4.0 - 10.5 K/uL   Comment: WHITE COUNT CONFIRMED ON SMEAR   RBC 5.56  4.22 - 5.81 MIL/uL   Hemoglobin 15.8  13.0 - 17.0 g/dL   HCT 78.2  95.6 - 21.3 %   MCV 81.1  78.0 - 100.0 fL   MCH 28.4  26.0 - 34.0 pg   MCHC 35.0  30.0 - 36.0 g/dL   RDW 08.6  57.8 - 46.9 %   Platelets 211  150 - 400 K/uL  BASIC METABOLIC PANEL     Status: Abnormal   Collection Time    04/26/13  5:30 AM      Result Value Range   Sodium 138  135 - 145 mEq/L   Potassium 3.7  3.5 - 5.1 mEq/L   Chloride 102  96 - 112 mEq/L   CO2 25  19 - 32 mEq/L   Glucose, Bld 109 (*) 70 - 99 mg/dL   BUN 8  6 - 23 mg/dL   Creatinine, Ser 6.29  0.50 - 1.35 mg/dL   Calcium 9.2  8.4 - 52.8 mg/dL   GFR calc non Af Amer 84 (*) >90 mL/min   GFR calc Af Amer >90  >90 mL/min   Comment:  The eGFR has been calculated     using the CKD EPI equation.     This calculation has not been     validated in all clinical     situations.     eGFR's persistently     <90 mL/min signify     possible Chronic Kidney Disease.  GLUCOSE, CAPILLARY     Status: Abnormal   Collection Time    04/26/13  8:46 AM      Result Value Range   Glucose-Capillary 117 (*) 70 - 99 mg/dL   Comment 1 Notify RN     GLUCOSE, CAPILLARY     Status: Abnormal   Collection Time    04/27/13  8:31 AM      Result Value Range   Glucose-Capillary 114 (*) 70 - 99 mg/dL   Comment 1 Notify RN    CK     Status: Abnormal   Collection Time    04/27/13  8:41 AM      Result Value Range   Total CK 1604 (*) 7 - 232 U/L  GLUCOSE, CAPILLARY     Status: Abnormal   Collection Time    04/28/13  7:35 AM      Result Value Range   Glucose-Capillary 117 (*) 70 - 99 mg/dL  GLUCOSE, CAPILLARY     Status: None   Collection Time    04/29/13  7:51 AM      Result Value Range   Glucose-Capillary 96  70 - 99 mg/dL   Comment 1 Notify RN    GLUCOSE, CAPILLARY     Status: Abnormal   Collection Time    04/30/13  7:59 AM      Result Value Range   Glucose-Capillary 101 (*) 70 - 99 mg/dL    General Appearance: well nourished and obese  Musculoskeletal: Strength & Muscle Tone: within normal limits Gait & Station: normal Patient leans: N/A  Psychiatric: Speech (describe rate, volume, coherence, spontaneity, and abnormalities if any): Slow but clear and coherent.  Thought Process (describe rate, content, abstract reasoning, and computation): Logical and goal-directed.  Associations: Relevant and Intact  Thoughts: normal and rummination  Mental Status: Orientation: oriented to person, place, time/date, situation, day of week, month of year and year Mood & Affect: depressed affect, anxiety and labile affect Attention Span & Concentration: Fair  Medical Decision Making (Choose Three): Established Problem, Stable/Improving (1), Review of Psycho-Social Stressors (1), Review of Last Therapy Session (1), Review of Medication Regimen & Side Effects (2) and Review of New Medication or Change in Dosage (2)  Assessment: Axis I: Bipolar disorder NOS  Axis II: Deferred  Axis III:  Patient Active Problem List   Diagnosis Date Noted  . Drug overdose 04/23/2013  . Respiratory failure with hypoxia 04/23/2013  . Acute  encephalopathy 04/23/2013  . Gastroesophageal reflux disease 11/28/2011  . Bipolar disorder 11/25/2011    Axis IV: Moderate  Axis V: 60-65   Plan:  Patient has appointment with Dr. Mat Carne packs for ECT evaluation.  I encouraged him to keep that appointment.  I will continue temazepam 15 mg at bedtime suspicion is sleeping better with this medication.  I will discontinue Depakote.  We'll continue trazodone and Cogentin.  Recommend to see therapist for coping and social skills.  Followup in 4 weeks.  Patient will call us about his ECT evaluation.  Time spent 25 minutes.  More than 50% of the time spent in psychoeducation, counseling and coordination of care.  Discuss safety plan  that anytime having active suicidal thoughts or homicidal thoughts then patient need to call 911 or go to the local emergency room.  Taten Merrow T., MD 07/18/2013

## 2013-07-23 ENCOUNTER — Ambulatory Visit (INDEPENDENT_AMBULATORY_CARE_PROVIDER_SITE_OTHER): Payer: No Typology Code available for payment source | Admitting: Psychiatry

## 2013-07-23 DIAGNOSIS — F319 Bipolar disorder, unspecified: Secondary | ICD-10-CM

## 2013-07-23 NOTE — Progress Notes (Signed)
   THERAPIST PROGRESS NOTE  Session Time: 1:30-2:30 pm  Participation Level: Active  Behavioral Response: CasualAlertEuthymic  Type of Therapy: Individual Therapy  Treatment Goals addressed: Coping  Interventions: CBT  Summary: Sean Hill is a 72 y.o. male who presents with severe anxious mood and affect. Pt said he feels increased anxiety associated with feeling a "loss of control" over things in his life such as his wife's possible returning cancer and awaiting the decision from [facility] about he ECT treatments. Pt described presenting all the negative reasons to the ECT doctor as to why it won't work for him and then in today's session was discouraged because they may not deem him an appropriate patient. Pt catastrophizes and was in a negative frame of thought where he was identifying all the negatives in his life and struggled to see anything positive. Pt said he still wants the ECT treatment and agreed to not point out the barriers with them and discuss them only in sessions with Clinical research associate.   Suicidal/Homicidal: Nowithout intent/plan  Therapist Response: Assessed overall level of functioning per Pt self report, challenged negative thought processes and encouraged a more open mind. Encouraged Pt get out more due to focusing too much on himself and his "symptoms".   Plan: Return again in 1 week.  Diagnosis: Axis I: Bipolar Disorder    Axis II: No diagnosis    Creg Gilmer E, LCSW 07/23/2013

## 2013-07-25 ENCOUNTER — Other Ambulatory Visit: Payer: Self-pay

## 2013-07-29 ENCOUNTER — Ambulatory Visit (INDEPENDENT_AMBULATORY_CARE_PROVIDER_SITE_OTHER): Payer: No Typology Code available for payment source | Admitting: Psychiatry

## 2013-07-29 DIAGNOSIS — F319 Bipolar disorder, unspecified: Secondary | ICD-10-CM

## 2013-07-29 NOTE — Progress Notes (Signed)
   THERAPIST PROGRESS NOTE  Session Time: 1:30-2:20 pm  Participation Level: Active  Behavioral Response: Neat and Well GroomedAlertEuthymic  Type of Therapy: Individual Therapy  Treatment Goals addressed: Coping  Interventions: Solution Focused  Summary: Sean Hill is a 72 y.o. male who presents with euthymic mood and affect. Pt states the medications are "working very well" and Pt denies any active mood related symptoms. His main concern was the "side affects" he is having from his medications, dry mouth, blurred vision etc, however Pt is unsure if those symptoms could be caused by his medical medications. Pt is constantly aware of side affects of his medications and is overly focused on them. Writer spoke with Dr. Patterson Hammersmith from [name] regarding Pts referral for ECT treatment during Pts session and he stated he does not feel Pt is currently appropriate due to how Pt described a minimal amount of depression symptoms at his recent consultation. Pt was exploring the possibility of TMS treatments to be discussed next week at his appointment with dr. Lolly Mustache as an alternative.   Suicidal/Homicidal: Nowithout intent/plan  Therapist Response: Assessed overall level of functioning and encouraged Pt get out of the house to distract away from his side affects and also discussed TMS treatment with Dr. Lolly Mustache.   Plan: Return again in 1  weeks.  Diagnosis: Axis I: Bipolar disorder    Axis II: No diagnosis    Sean Cure E, LCSW 07/29/2013

## 2013-07-30 ENCOUNTER — Ambulatory Visit (HOSPITAL_COMMUNITY): Payer: Self-pay | Admitting: Psychiatry

## 2013-07-30 ENCOUNTER — Ambulatory Visit (INDEPENDENT_AMBULATORY_CARE_PROVIDER_SITE_OTHER): Payer: No Typology Code available for payment source | Admitting: *Deleted

## 2013-07-30 VITALS — BP 138/81 | HR 71 | Ht 69.0 in | Wt 261.0 lb

## 2013-07-30 DIAGNOSIS — F319 Bipolar disorder, unspecified: Secondary | ICD-10-CM

## 2013-07-30 MED ORDER — ARIPIPRAZOLE ER 400 MG IM SUSR
400.0000 mg | INTRAMUSCULAR | Status: DC
  Administered 2013-07-30: 400 mg via INTRAMUSCULAR

## 2013-07-31 ENCOUNTER — Ambulatory Visit (INDEPENDENT_AMBULATORY_CARE_PROVIDER_SITE_OTHER): Payer: No Typology Code available for payment source | Admitting: Psychiatry

## 2013-07-31 ENCOUNTER — Encounter (HOSPITAL_COMMUNITY): Payer: Self-pay | Admitting: Psychiatry

## 2013-07-31 DIAGNOSIS — G4700 Insomnia, unspecified: Secondary | ICD-10-CM

## 2013-07-31 DIAGNOSIS — F319 Bipolar disorder, unspecified: Secondary | ICD-10-CM

## 2013-07-31 MED ORDER — BENZTROPINE MESYLATE 1 MG PO TABS: 1.0000 mg | ORAL_TABLET | Freq: Two times a day (BID) | ORAL | Status: DC

## 2013-07-31 MED ORDER — TEMAZEPAM 15 MG PO CAPS: 15.0000 mg | ORAL_CAPSULE | Freq: Every evening | ORAL | Status: DC | PRN

## 2013-07-31 MED ORDER — TOPIRAMATE 25 MG PO TABS: 25.0000 mg | ORAL_TABLET | Freq: Every day | ORAL | Status: DC

## 2013-07-31 MED ORDER — TRAZODONE HCL 50 MG PO TABS: 50.0000 mg | ORAL_TABLET | Freq: Every day | ORAL | Status: DC

## 2013-07-31 NOTE — Progress Notes (Signed)
[facility] [address] Progress Note  Sean Hill [id number] 72 y.o.  07/31/2013 1:28 PM  Chief Complaint:  I am gaining weight.  I still have a lot of dry mouth.  However when sleeping better.      History of Present Illness: Sean Hill came for his followup appointment.  He has seen Sean Hill for ACD evaluation however he was found to be not appropriate for ECT treatment at this time.  Patient was disappointed .  Overall he is doing better with Abilify.  He continues to have side effects related to Abilify which are mostly dry mouth and weight gain .  His mood is better.  He denies any irritability anger or any suicidal thoughts.  He is concerned about his wife who may relapse into cancer again.  After the consultation for ECT he decided to stay on Abilify which helped him than any other psychotropic medication.  He is wondering if anything else can he take to help weight loss and dry mouth.  He is taking trazodone 50 mg at bedtime, Cogentin 1 mg twice a day and Abilify 400 mg IM.  He is also taking temazepam for insomnia.  I told him to not to take temazepam every night for better efficacy.  He's been doing that and he liked the temazepam dating every other night.  He is not drinking or using any illegal substance.  He is not paranoid or having any suicidal thoughts.  Suicidal Ideation: No Plan Formed: No Patient has means to carry out plan: No  Homicidal Ideation: No Plan Formed: No Patient has means to carry out plan: No  Review of Systems  HENT: Negative.   Musculoskeletal: Positive for back pain.  Neurological: Negative.   Psychiatric/Behavioral: Negative for hallucinations.    Psychiatric: Agitation: Yes Hallucination: No Depressed Mood: Yes Insomnia: No Hypersomnia: No Altered Concentration: No Feels Worthless: No Grandiose Ideas: No Belief In Special Powers: No New/Increased Substance Abuse: No Compulsions: No  Neurologic: Headache: No Seizure:  No Paresthesias: No  Medical History:  Patient has a history of high triglycerides, irritable bowel syndrome, chronic pain, GERD.  He denies any history of seizures blackouts, traumatic brain injury or any loss of consciousness.  Legal history.  The patient has no current legal issues.   History of abuse.  Patient denies any history of physical sexual verbal and emotional abuse.   Medical history.  Patient has high triglycerides, irritable bowel syndrome, chronic pain, GERD. He is not taking medication for his high triglycerides. Patient denies any history of seizures, blackouts, traumatic brain injury or any loss of consciousness.   Family history.  Patient endorse parent's were alcoholic, younger daughter suffers from depression, nephew died due to suicide. Daughter has been admitted at least twice to [facility] and once [facility].   Education and work history.  Patient has a high school education. Currently he is retired.   Alcohol and substance use history.  Patient admitted history of alcohol or any illegal substance use.  He admitted increased drinking and past few months.  Psychosocial history.  Patient is born and raised in [place]. His been married once. He has 2 daughters. Patient lives with his wife. His daughter lives close by. He has good relationship with his daughter.   Past Psychiatric History/Hospitalization(s): Patient has at least 4-5 psychiatric hospitalization.  His last psychiatric admission was in July 2014 .  He has a history of taking overdose on his medication and mixing with alcohol.  He has a history of cutting his wrist. He is taking his psychiatric medication for past 8 years. In the past he has seen Sean Hill and Sean Hill. As per chart he has taken in the past Tegretol, Depakote, Ambien, Geodon, Abilify, lithium, Provigil, Zoloft, Neurontin , Lamictal, Wellbutrin , Risperdal and Prstiq.  He said he overdosed on Latuda. Patient do not  recall very well the previous response of these medication. However he felt taking all these medications together was making him very drowsy groggy and unable to function. Patient endorsed history of mania and severe impulsive behavior. He admitted history of buying unnecessary, getting speeding tickets and aggression.  Patient reported history of side effects due to polypharmacy.  Recently he had tried Remeron and Vistaril for insomnia but did not help him. Anxiety: Yes Bipolar Disorder: Yes Depression: Yes Mania: Yes Psychosis: Yes Schizophrenia: No Personality Disorder: No Hospitalization for psychiatric illness: Yes History of Electroconvulsive Shock Therapy: No Prior Suicide Attempts: Yes  Physical Exam: Constitutional:  There were no vitals taken for this visit. patient refused his vital today because he has vitals yesterday.   Recent Results (from the past 2160 hour(s))  URINE RAPID DRUG SCREEN (HOSP PERFORMED)     Status: None   Collection Time    04/23/13  6:21 PM      Result Value Range   Opiates NONE DETECTED  NONE DETECTED   Cocaine NONE DETECTED  NONE DETECTED   Benzodiazepines NONE DETECTED  NONE DETECTED   Amphetamines NONE DETECTED  NONE DETECTED   Tetrahydrocannabinol NONE DETECTED  NONE DETECTED   Barbiturates NONE DETECTED  NONE DETECTED   Comment:            DRUG SCREEN FOR MEDICAL PURPOSES     ONLY.  IF CONFIRMATION IS NEEDED     FOR ANY PURPOSE, NOTIFY LAB     WITHIN 5 DAYS.                LOWEST DETECTABLE LIMITS     FOR URINE DRUG SCREEN     Drug Class       Cutoff (ng/mL)     Amphetamine      1000     Barbiturate      200     Benzodiazepine   200     Tricyclics       300     Opiates          300     Cocaine          300     THC              50  URINALYSIS, ROUTINE W REFLEX MICROSCOPIC     Status: None   Collection Time    04/23/13  6:21 PM      Result Value Range   Color, Urine YELLOW  YELLOW   APPearance CLEAR  CLEAR   Specific Gravity, Urine  1.015  1.005 - 1.030   pH 5.5  5.0 - 8.0   Glucose, UA NEGATIVE  NEGATIVE mg/dL   Hgb urine dipstick NEGATIVE  NEGATIVE   Bilirubin Urine NEGATIVE  NEGATIVE   Ketones, ur NEGATIVE  NEGATIVE mg/dL   Protein, ur NEGATIVE  NEGATIVE mg/dL   Urobilinogen, UA 0.2  0.0 - 1.0 mg/dL   Nitrite NEGATIVE  NEGATIVE   Leukocytes, UA NEGATIVE  NEGATIVE   Comment: MICROSCOPIC NOT DONE ON URINES WITH NEGATIVE PROTEIN, BLOOD, LEUKOCYTES, NITRITE, OR GLUCOSE <1000 mg/dL.  ACETAMINOPHEN LEVEL     Status: None   Collection Time    04/23/13  6:30 PM      Result Value Range   Acetaminophen (Tylenol), Serum <15.0  10 - 30 ug/mL   Comment:            THERAPEUTIC CONCENTRATIONS VARY     SIGNIFICANTLY. A RANGE OF 10-30     ug/mL MAY BE AN EFFECTIVE     CONCENTRATION FOR MANY PATIENTS.     HOWEVER, SOME ARE BEST TREATED     AT CONCENTRATIONS OUTSIDE THIS     RANGE.     ACETAMINOPHEN CONCENTRATIONS     >150 ug/mL AT 4 HOURS AFTER     INGESTION AND >50 ug/mL AT 12     HOURS AFTER INGESTION ARE     OFTEN ASSOCIATED WITH TOXIC     REACTIONS.  CBC WITH DIFFERENTIAL     Status: None   Collection Time    04/23/13  6:30 PM      Result Value Range   WBC 5.5  4.0 - 10.5 K/uL   RBC 5.51  4.22 - 5.81 MIL/uL   Hemoglobin 15.6  13.0 - 17.0 g/dL   HCT 16.1  09.6 - 04.5 %   MCV 81.3  78.0 - 100.0 fL   MCH 28.3  26.0 - 34.0 pg   MCHC 34.8  30.0 - 36.0 g/dL   RDW 40.9  81.1 - 91.4 %   Platelets 213  150 - 400 K/uL   Neutrophils Relative % 55  43 - 77 %   Neutro Abs 3.0  1.7 - 7.7 K/uL   Lymphocytes Relative 33  12 - 46 %   Lymphs Abs 1.8  0.7 - 4.0 K/uL   Monocytes Relative 10  3 - 12 %   Monocytes Absolute 0.5  0.1 - 1.0 K/uL   Eosinophils Relative 2  0 - 5 %   Eosinophils Absolute 0.1  0.0 - 0.7 K/uL   Basophils Relative 1  0 - 1 %   Basophils Absolute 0.1  0.0 - 0.1 K/uL  COMPREHENSIVE METABOLIC PANEL     Status: Abnormal   Collection Time    04/23/13  6:30 PM      Result Value Range   Sodium 135  135  - 145 mEq/L   Potassium 4.2  3.5 - 5.1 mEq/L   Comment: SLIGHT HEMOLYSIS   Chloride 102  96 - 112 mEq/L   CO2 24  19 - 32 mEq/L   Glucose, Bld 95  70 - 99 mg/dL   BUN 12  6 - 23 mg/dL   Creatinine, Ser 7.82  0.50 - 1.35 mg/dL   Calcium 9.1  8.4 - 95.6 mg/dL   Total Protein 6.4  6.0 - 8.3 g/dL   Albumin 3.4 (*) 3.5 - 5.2 g/dL   AST 31  0 - 37 U/L   ALT 22  0 - 53 U/L   Alkaline Phosphatase 85  39 - 117 U/L   Total Bilirubin 0.4  0.3 - 1.2 mg/dL   GFR calc non Af Amer 86 (*) >90 mL/min   GFR calc Af Amer >90  >90 mL/min   Comment:            The eGFR has been calculated     using the CKD EPI equation.     This calculation has not been     validated in all clinical     situations.  eGFR's persistently     <90 mL/min signify     possible Chronic Kidney Disease.  ETHANOL     Status: None   Collection Time    04/23/13  6:30 PM      Result Value Range   Alcohol, Ethyl (B) <11  0 - 11 mg/dL   Comment:            LOWEST DETECTABLE LIMIT FOR     SERUM ALCOHOL IS 11 mg/dL     FOR MEDICAL PURPOSES ONLY  SALICYLATE LEVEL     Status: Abnormal   Collection Time    04/23/13  6:30 PM      Result Value Range   Salicylate Lvl <2.0 (*) 2.8 - 20.0 mg/dL  POCT I-STAT, CHEM 8     Status: Abnormal   Collection Time    04/23/13  6:46 PM      Result Value Range   Sodium 136  135 - 145 mEq/L   Potassium 5.4 (*) 3.5 - 5.1 mEq/L   Chloride 106  96 - 112 mEq/L   BUN 16  6 - 23 mg/dL   Creatinine, Ser 1.61  0.50 - 1.35 mg/dL   Glucose, Bld 95  70 - 99 mg/dL   Calcium, Ion 0.96 (*) 1.13 - 1.30 mmol/L   TCO2 25  0 - 100 mmol/L   Hemoglobin 16.0  13.0 - 17.0 g/dL   HCT 04.5  40.9 - 81.1 %  MRSA PCR SCREENING     Status: Abnormal   Collection Time    04/23/13 10:04 PM      Result Value Range   MRSA by PCR POSITIVE (*) NEGATIVE   Comment:            The GeneXpert MRSA Assay (FDA     approved for NASAL specimens     only), is one component of a     comprehensive MRSA colonization      surveillance program. It is not     intended to diagnose MRSA     infection nor to guide or     monitor treatment for     MRSA infections.     RESULT CALLED TO, READ BACK BY AND VERIFIED WITH:     PARKER,A RN @ 2328 ON 08.05.2014 BY MCREYNOLDS,B  COMPREHENSIVE METABOLIC PANEL     Status: Abnormal   Collection Time    04/23/13 10:40 PM      Result Value Range   Sodium 135  135 - 145 mEq/L   Potassium 4.1  3.5 - 5.1 mEq/L   Comment: DELTA CHECK NOTED     REPEATED TO VERIFY   Chloride 102  96 - 112 mEq/L   CO2 24  19 - 32 mEq/L   Glucose, Bld 111 (*) 70 - 99 mg/dL   BUN 11  6 - 23 mg/dL   Creatinine, Ser 9.14  0.50 - 1.35 mg/dL   Calcium 8.8  8.4 - 78.2 mg/dL   Total Protein 6.0  6.0 - 8.3 g/dL   Albumin 3.3 (*) 3.5 - 5.2 g/dL   AST 23  0 - 37 U/L   ALT 20  0 - 53 U/L   Alkaline Phosphatase 82  39 - 117 U/L   Total Bilirubin 0.4  0.3 - 1.2 mg/dL   GFR calc non Af Amer 86 (*) >90 mL/min   GFR calc Af Amer >90  >90 mL/min   Comment:  The eGFR has been calculated     using the CKD EPI equation.     This calculation has not been     validated in all clinical     situations.     eGFR's persistently     <90 mL/min signify     possible Chronic Kidney Disease.  MAGNESIUM     Status: None   Collection Time    04/23/13 10:40 PM      Result Value Range   Magnesium 1.9  1.5 - 2.5 mg/dL  PHOSPHORUS     Status: None   Collection Time    04/23/13 10:40 PM      Result Value Range   Phosphorus 3.9  2.3 - 4.6 mg/dL  CBC WITH DIFFERENTIAL     Status: None   Collection Time    04/23/13 10:40 PM      Result Value Range   WBC 6.7  4.0 - 10.5 K/uL   RBC 5.26  4.22 - 5.81 MIL/uL   Hemoglobin 14.7  13.0 - 17.0 g/dL   HCT 16.1  09.6 - 04.5 %   MCV 81.7  78.0 - 100.0 fL   MCH 27.9  26.0 - 34.0 pg   MCHC 34.2  30.0 - 36.0 g/dL   RDW 40.9  81.1 - 91.4 %   Platelets 199  150 - 400 K/uL   Neutrophils Relative % 73  43 - 77 %   Neutro Abs 4.8  1.7 - 7.7 K/uL   Lymphocytes  Relative 19  12 - 46 %   Lymphs Abs 1.3  0.7 - 4.0 K/uL   Monocytes Relative 8  3 - 12 %   Monocytes Absolute 0.5  0.1 - 1.0 K/uL   Eosinophils Relative 1  0 - 5 %   Eosinophils Absolute 0.0  0.0 - 0.7 K/uL   Basophils Relative 0  0 - 1 %   Basophils Absolute 0.0  0.0 - 0.1 K/uL  APTT     Status: None   Collection Time    04/23/13 10:40 PM      Result Value Range   aPTT 31  24 - 37 seconds  PROTIME-INR     Status: None   Collection Time    04/23/13 10:40 PM      Result Value Range   Prothrombin Time 14.2  11.6 - 15.2 seconds   INR 1.12  0.00 - 1.49  TSH     Status: None   Collection Time    04/23/13 10:40 PM      Result Value Range   TSH 0.777  0.350 - 4.500 uIU/mL   Comment: Performed at [facility]  COMPREHENSIVE METABOLIC PANEL     Status: Abnormal   Collection Time    04/24/13  3:32 AM      Result Value Range   Sodium 135  135 - 145 mEq/L   Potassium 4.0  3.5 - 5.1 mEq/L   Chloride 102  96 - 112 mEq/L   CO2 25  19 - 32 mEq/L   Glucose, Bld 119 (*) 70 - 99 mg/dL   BUN 11  6 - 23 mg/dL   Creatinine, Ser 7.82  0.50 - 1.35 mg/dL   Calcium 9.0  8.4 - 95.6 mg/dL   Total Protein 6.1  6.0 - 8.3 g/dL   Albumin 3.4 (*) 3.5 - 5.2 g/dL   AST 23  0 - 37 U/L   ALT 18  0 - 53 U/L  Alkaline Phosphatase 81  39 - 117 U/L   Total Bilirubin 0.5  0.3 - 1.2 mg/dL   GFR calc non Af Amer 87 (*) >90 mL/min   GFR calc Af Amer >90  >90 mL/min   Comment:            The eGFR has been calculated     using the CKD EPI equation.     This calculation has not been     validated in all clinical     situations.     eGFR's persistently     <90 mL/min signify     possible Chronic Kidney Disease.  CBC     Status: None   Collection Time    04/24/13  3:32 AM      Result Value Range   WBC 8.9  4.0 - 10.5 K/uL   RBC 5.23  4.22 - 5.81 MIL/uL   Hemoglobin 14.8  13.0 - 17.0 g/dL   HCT 16.1  09.6 - 04.5 %   MCV 81.5  78.0 - 100.0 fL   MCH 28.3  26.0 - 34.0 pg   MCHC 34.7  30.0 - 36.0  g/dL   RDW 40.9  81.1 - 91.4 %   Platelets 201  150 - 400 K/uL  GLUCOSE, CAPILLARY     Status: Abnormal   Collection Time    04/24/13  7:59 AM      Result Value Range   Glucose-Capillary 101 (*) 70 - 99 mg/dL  GLUCOSE, CAPILLARY     Status: None   Collection Time    04/25/13  7:55 AM      Result Value Range   Glucose-Capillary 86  70 - 99 mg/dL  CK     Status: Abnormal   Collection Time    04/25/13  1:55 PM      Result Value Range   Total CK 2130 (*) 7 - 232 U/L  CBC     Status: None   Collection Time    04/26/13  5:30 AM      Result Value Range   WBC 9.4  4.0 - 10.5 K/uL   Comment: WHITE COUNT CONFIRMED ON SMEAR   RBC 5.56  4.22 - 5.81 MIL/uL   Hemoglobin 15.8  13.0 - 17.0 g/dL   HCT 78.2  95.6 - 21.3 %   MCV 81.1  78.0 - 100.0 fL   MCH 28.4  26.0 - 34.0 pg   MCHC 35.0  30.0 - 36.0 g/dL   RDW 08.6  57.8 - 46.9 %   Platelets 211  150 - 400 K/uL  BASIC METABOLIC PANEL     Status: Abnormal   Collection Time    04/26/13  5:30 AM      Result Value Range   Sodium 138  135 - 145 mEq/L   Potassium 3.7  3.5 - 5.1 mEq/L   Chloride 102  96 - 112 mEq/L   CO2 25  19 - 32 mEq/L   Glucose, Bld 109 (*) 70 - 99 mg/dL   BUN 8  6 - 23 mg/dL   Creatinine, Ser 6.29  0.50 - 1.35 mg/dL   Calcium 9.2  8.4 - 52.8 mg/dL   GFR calc non Af Amer 84 (*) >90 mL/min   GFR calc Af Amer >90  >90 mL/min   Comment:            The eGFR has been calculated     using the CKD EPI equation.  This calculation has not been     validated in all clinical     situations.     eGFR's persistently     <90 mL/min signify     possible Chronic Kidney Disease.  GLUCOSE, CAPILLARY     Status: Abnormal   Collection Time    04/26/13  8:46 AM      Result Value Range   Glucose-Capillary 117 (*) 70 - 99 mg/dL   Comment 1 Notify RN    GLUCOSE, CAPILLARY     Status: Abnormal   Collection Time    04/27/13  8:31 AM      Result Value Range   Glucose-Capillary 114 (*) 70 - 99 mg/dL   Comment 1 Notify RN    CK      Status: Abnormal   Collection Time    04/27/13  8:41 AM      Result Value Range   Total CK 1604 (*) 7 - 232 U/L  GLUCOSE, CAPILLARY     Status: Abnormal   Collection Time    04/28/13  7:35 AM      Result Value Range   Glucose-Capillary 117 (*) 70 - 99 mg/dL  GLUCOSE, CAPILLARY     Status: None   Collection Time    04/29/13  7:51 AM      Result Value Range   Glucose-Capillary 96  70 - 99 mg/dL   Comment 1 Notify RN    GLUCOSE, CAPILLARY     Status: Abnormal   Collection Time    04/30/13  7:59 AM      Result Value Range   Glucose-Capillary 101 (*) 70 - 99 mg/dL    General Appearance: well nourished and obese  Musculoskeletal: Strength & Muscle Tone: within normal limits Gait & Station: normal Patient leans: N/A  Psychiatric: Speech (describe rate, volume, coherence, spontaneity, and abnormalities if any): Slow but clear and coherent.  Thought Process (describe rate, content, abstract reasoning, and computation): Logical and goal-directed.  Associations: Relevant and Intact  Thoughts: normal and rummination  Mental Status: Orientation: oriented to person, place, time/date, situation, day of week, month of year and year Mood & Affect: anxiety Attention Span & Concentration: Fair  Medical Decision Making (Choose Three): Established Problem, Stable/Improving (1), Review or order clinical lab tests (1), Review of Last Therapy Session (1), Review of Medication Regimen & Side Effects (2) and Review of New Medication or Change in Dosage (2)  Assessment: Axis I: Bipolar disorder NOS  Axis II: Deferred  Axis III:  Patient Active Problem List   Diagnosis Date Noted  . Drug overdose 04/23/2013  . Respiratory failure with hypoxia 04/23/2013  . Acute encephalopathy 04/23/2013  . Gastroesophageal reflux disease 11/28/2011  . Bipolar disorder 11/25/2011    Axis IV: Moderate  Axis V: 60-65   Plan:  I will discontinue Depakote.  I was at Topamax 25 mg at bedtime.   Recommended to take Cogentin half tablet twice a day to avoid any dry mouth.  Continue trazodone 50 mg at bedtime and temazepam 30 mg for insomnia.  Continue Abilify 400 mg IM every 4 weeks.  Patient does not want his vitals to be taken since he has taken yesterday.  We discussed in detail because cognition and program.  Patient is hoping that Topamax would reduce his weight.  Followup in 2 months.  Recommended to see therapist for coping and social skills.  Time spent 25 minutes.  More than 50% of the time spent in psychoeducation,  counseling and coordination of care.  Discuss safety plan that anytime having active suicidal thoughts or homicidal thoughts then patient need to call 911 or go to the local emergency room.  Georgi Navarrete T., MD 07/31/2013

## 2013-08-06 ENCOUNTER — Ambulatory Visit (INDEPENDENT_AMBULATORY_CARE_PROVIDER_SITE_OTHER): Payer: No Typology Code available for payment source | Admitting: Psychiatry

## 2013-08-06 DIAGNOSIS — F319 Bipolar disorder, unspecified: Secondary | ICD-10-CM

## 2013-08-07 NOTE — Progress Notes (Signed)
THERAPIST PROGRESS NOTE  Session Time: 1:30-2:30 pm  Participation Level: Active  Behavioral Response: CasualAlertEuthymic  Type of Therapy: Individual Therapy  Treatment Goals addressed: Coping  Interventions: Other: Updated Treatment Plan Goals  Summary: Sean Hill is a 72 y.o. male who presents with euthymic mood and affect and said this is the best emotionally he has felt in a long time.   Suicidal/Homicidal: Nowithout intent/plan  Therapist Response: Updated Treatment Plan Goals see below for current plan   INDIVIDUAL TREATMENT PLAN       Date of Admission:  04/15/2010 Date of Treatment Plan: 08/06/2013 Service: [x]  Group  [x]  Individual [x]  Comprehensive [x]  Revised due to: []  Change in Diagnosis     []  Change in Service     [x]  Expiration of Previous Treatment Plan Diagnosis(es):  Recommended Treatment:  [x]  Chemical Dependency (CD-IOP) group therapy 3x weekly and 1:1 individual therapy as required  []  Mental Health (MH-IOP) group therapy 5x weekly and 1:1 individual therapy as required  [x]  Individual Therapy  []  Family Therapy  [x]  Couples Therapy  [] Other:           Possible Negative Outcomes of Treatment: Symptoms of mental illness may increase and changes in relationship can occur during the course of treatment.  Client's Strengths (What are the strengths and resources that will help clients move towards their goals):  Intelligent, Assertive, Motivated and attends all sessions    Personal Recovery Goal(s)/Client's Goals for Treatment (use client's words):  Manage Bipolar Symptoms    Objective Behavioral Criteria for Discharge: Client will maintain stability in the community as demonstrated by the following:   No suicidal behaviors for 3 months.   No hospitalizations or emergency room visits for psychological reasons for 3 months.   No SIB behaviors requiring medical attention for 3 months.   Emotional regulation by reporting distress at an  average of 5/10 or below for 3 months.   Demonstration of healthy community supports by initiating peer contact at least twice weekly separate from the treatment environment.   Agreed-upon transition plan to a less restrictive setting.  INFORMED CONSENT TO TREATMENT. I acknowledge that I have been informed of and am able to understand my diagnosis, the nature and purpose of the proposed treatment, the risks and benefits of the proposed treatment, the possible negative outcomes of and possible alternatives to the proposed treatment, the probability that the proposed treatment will be successful, and the prognosis if I choose not to receive the treatment. Further, I have been informed of the extent and limits of confidentiality of treatment information. I understand the risks, benefits, possible negative outcomes of and alternatives to this proposed treatment, and my signature below verifies that I have actively participated in the development of my treatment plan and that I am willingly and voluntarily agreeing to the treatment outlined in this plan. Assessed Needs (Problem) Client's Goals (what client will do)   Interventions (what staff will do)   1.  Bipolar Disorder  o Manage Symptoms of Bipolar Disorder  o Teach DBT skills to manage bipolar symptoms o Explore impulsivity and consequences of behaviors associated with it.  o Encourage sharing of feelings associated with having Bipolar Disorder and the effects it has on overall functioning. o Explore holistic treatment options for symptoms management.    2.  Side Effects from medications    o Learn to Accept and tolerate side effects of medications.   o Teach the DBT skill of ACCEPTS and Radical Acceptance to  help Pt manage medication side effects. o Educate on skills of distraction and mindfulness. o Provide supportive therapy around the difficulty managing symptoms. o Explore options to make the side effects tolerable.    3. Too much  Unstructured Time   o Get out of the house regularly during the week.  o Encourage weekly outings to reduce depression. o Assign homework to encourage socialization outside of the home. o Explore volunteer opportunities to participate in to be more active.    I have [] received [] declined a copy of this treatment plan.  Client: Date: Guardian/Parent: Date:   Therapist: Date: Licensed Provider (if applicable): Date:    Six month review:   I have reviewed this treatment plan and consider it still valid. Client: Date: Guardian/Parent: Date:      Plan: Return again in 1 weeks.  Diagnosis: Axis I: Bipolar Disorder    Axis II: No diagnosis    Jasim Harari E, LCSW 08/07/2013

## 2013-08-13 ENCOUNTER — Ambulatory Visit (INDEPENDENT_AMBULATORY_CARE_PROVIDER_SITE_OTHER): Payer: No Typology Code available for payment source | Admitting: Psychiatry

## 2013-08-13 DIAGNOSIS — F319 Bipolar disorder, unspecified: Secondary | ICD-10-CM

## 2013-08-13 NOTE — Progress Notes (Signed)
   THERAPIST PROGRESS NOTE  Session Time: 2:30-3:25 pm  Participation Level: Active  Behavioral Response: Neat and Well GroomedAlertEuthymic  Type of Therapy: Individual Therapy  Treatment Goals addressed: Coping  Interventions: CBT and Supportive  Summary: Sean Hill is a 72 y.o. male who presents with elevated mood and affect and said "I am doing well and this is the best I have felt in the past eight years". Pt said he feels the medication has him stabilized and he is learning to accept the side affects of the medications. Pt is talkative about his family and appeared more invested in his relationships with his wife and children. Pt showed more compassion towards them than in previous sessions. Pt said he continues to get out of the house and feels it makes him feel better. Pt said he would like to find a job for about 15-20 hours per week and would like to explore that going forward in sessions.  Pt also said he has been getting along better with is wife and is able to delay what he says to her that may trigger an argument. Pt denies any outbursts from anger. This is the is the most stable writer has seen Pt since Pt started therapy.   Suicidal/Homicidal: Nowithout intent/plan  Therapist Response: Assessed overall level of functioning. Reviewed mood stability and acceptance of medical condition and medications, reviewed treatment goals and explored getting a job to manage free time.   Plan: Return again in 1 weeks.  Diagnosis: Axis I: Bipolar Disorder    Axis II: No diagnosis    Carman Ching, LCSW 08/13/2013

## 2013-08-20 ENCOUNTER — Ambulatory Visit (INDEPENDENT_AMBULATORY_CARE_PROVIDER_SITE_OTHER): Payer: No Typology Code available for payment source | Admitting: Psychiatry

## 2013-08-20 ENCOUNTER — Ambulatory Visit (HOSPITAL_COMMUNITY): Payer: Self-pay | Admitting: Psychiatry

## 2013-08-20 DIAGNOSIS — F319 Bipolar disorder, unspecified: Secondary | ICD-10-CM

## 2013-08-20 NOTE — Progress Notes (Signed)
   THERAPIST PROGRESS NOTE  Session Time: 1:30-2:20 pm  Participation Level: Active  Behavioral Response: Neat and Well GroomedAlertEuthymic  Type of Therapy: Individual Therapy  Treatment Goals addressed: Coping  Interventions: CBT and Supportive  Summary: Sean Hill is a 72 y.o. male who presents with elevated mood and affect. Pt said he continues to be doing well without any mood fluctuations or conflicts with anyone. Pt said he "enjoyed Thanksgiving with his family". Pt has never talked about enjoying a holiday or spending time with his family. Pt is noticing a difference in his tolerance of others and situations and said he feels more "at ease". Pt said he is even tolerating the side affects of the medications. 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  Pt discussed moving to every other week appointments in the New Year.    Suicidal/Homicidal: Nowithout intent/plan  Therapist Response: Assessed overall level of functioning per Pt self report. Reviewed behaviors Pt is engaging in to maintain stable mood and encouraged continued routine for mood stability.   Plan: Return again in 1 weeks.  Diagnosis: Axis I: Bipolar Disorder    Axis II: No diagnosis    Carman Ching, LCSW 08/20/2013

## 2013-08-27 ENCOUNTER — Ambulatory Visit (INDEPENDENT_AMBULATORY_CARE_PROVIDER_SITE_OTHER): Payer: No Typology Code available for payment source | Admitting: *Deleted

## 2013-08-27 ENCOUNTER — Ambulatory Visit (INDEPENDENT_AMBULATORY_CARE_PROVIDER_SITE_OTHER): Payer: No Typology Code available for payment source | Admitting: Psychiatry

## 2013-08-27 VITALS — BP 143/74 | HR 75 | Ht 69.0 in | Wt 262.0 lb

## 2013-08-27 DIAGNOSIS — F319 Bipolar disorder, unspecified: Secondary | ICD-10-CM

## 2013-08-27 MED ORDER — ARIPIPRAZOLE ER 400 MG IM SUSR: 400.0000 mg | INTRAMUSCULAR | Status: DC

## 2013-08-27 NOTE — Progress Notes (Signed)
   THERAPIST PROGRESS NOTE  Session Time: 1:30-2:25 pm  Participation Level: Active  Behavioral Response: Casual, Neat and Well GroomedAlertDepressed  Type of Therapy: Individual Therapy  Treatment Goals addressed: Coping and Diagnosis: Bipolar Disorder  Interventions: CBT and Solution Focused  Summary: Sean Hill is a 72 y.o. male who presents with mild depressed mood and affect. Pt reports an increase in depression symptoms for the past week, but states his mood is improving today. Pt said he had increased irritability, sadness and impulsivity. Pt said he terminated some online freindships in his moments of impulsivity that he can't now repair. Pt said he regrets making those decions. Pt connected the increase in depression with it being the final week of his mood injection. Pt said he received his injection today. Pt is going to monitor this going forward to determine if he sees a pattern. Despite the depression, Pt reports still being able to function. Pt said he chose to take his wife out to a movie and continued to engage in routine activities. Pt is aware that he is still making progress with managing his depression and feels this was a minor fluctuation in mood. Pt said he feels "comfortable" with where he is in life now and explored what it would look like if he felt "happy". Pt talked about how he still feels he needs something to engage in outside of the home that gives him purpose, but he is still unsure of what that will be. Pt agreed to explore this for homework and also agreed to make a sign to put on his computer as a way to slow his impulsive reactions to de-friending people online during moments of impulsivity.   Suicidal/Homicidal: Nowithout intent/plan  Therapist Response: Assessed overall level of depression symptoms per Pt self report, reviewed depression triggers, encouraged mood tracking to monitor symptoms and worked on treatment goal of trying to find an activity  out of the home to manage free time.   Plan: Return again in 1 weeks.  Diagnosis: Axis I: Bipolar Disorder    Axis II: No diagnosis    Belem Hintze E, LCSW 08/27/2013

## 2013-08-27 NOTE — Progress Notes (Signed)
Abilify Maintena 400 mg given L upper outer Quadrant, Lot # [id number], (Sample) Pt denies any problems with past injections

## 2013-08-29 ENCOUNTER — Telehealth (HOSPITAL_COMMUNITY): Payer: Self-pay

## 2013-09-04 ENCOUNTER — Ambulatory Visit (HOSPITAL_COMMUNITY): Payer: Self-pay | Admitting: Psychiatry

## 2013-09-10 ENCOUNTER — Ambulatory Visit (INDEPENDENT_AMBULATORY_CARE_PROVIDER_SITE_OTHER): Payer: No Typology Code available for payment source | Admitting: Psychiatry

## 2013-09-10 DIAGNOSIS — F319 Bipolar disorder, unspecified: Secondary | ICD-10-CM

## 2013-09-11 NOTE — Progress Notes (Signed)
   THERAPIST PROGRESS NOTE  Session Time: 1:30-2:25 pm  Participation Level: Active  Behavioral Response: Casual, Neat and Well GroomedAlertEuthymic  Type of Therapy: Individual Therapy  Treatment Goals addressed: Diagnosis: Bipolar Disorder and goal of depression  Interventions: CBT and Solution Focused  Summary: Sean Hill is a 72 y.o. male who presents with continued stable mood and affect. Pt reports feeling "good" and being able to tolerate medication side affects. Pt reports a good relationship with his wife and increased ability to avoid conflict with her. Pt demonstrates improved ability to show compassion and empathy towards his family and others. Pt said he feels good with where he is.   Suicidal/Homicidal: Nowithout intent/plan  Therapist Response: Worked on goal of depression, explored continued improved mood and used dbt skills to reinforce continued wellness.   Plan: Return again in 1 weeks.  Diagnosis: Axis I: Bipolar disorder    Axis II: No diagnosis    Carman Ching, LCSW 09/11/2013

## 2013-09-17 ENCOUNTER — Ambulatory Visit (INDEPENDENT_AMBULATORY_CARE_PROVIDER_SITE_OTHER): Payer: No Typology Code available for payment source | Admitting: Psychiatry

## 2013-09-17 DIAGNOSIS — F319 Bipolar disorder, unspecified: Secondary | ICD-10-CM

## 2013-09-17 NOTE — Progress Notes (Signed)
   THERAPIST PROGRESS NOTE  Session Time: 3:30-4:25 pm  Participation Level: Active  Behavioral Response: CasualAlertDepressed  Type of Therapy: Individual Therapy  Treatment Goals addressed: Diagnosis: Goal of Depression  Interventions: CBT, DBT and Solution Focused  Summary: Sean Hill is a 72 y.o. male. Pt reports high depression symptoms with suicidal ideation without plans, intent or means. Pt identified that his increase in depression coincides with the final week of the calendar month where his psychotropic medication injection has worn off. Pt reported the same pattern last month and plans to address this with his psychiatrist at his session next week. Pt is due for his next injection next week. Pts thoughts were negative in nature with regret over his past and financial worries about his future. Pt struggled to identify this as a side affect from his medication wearing off and required continuous cognitive restructuring to help him see this as a pattern of his medication wearing off.  Pt agreed to mark on his calendar going forward his good days and mark that the final week of the month would be difficult with depressive symptoms. Pt also identified the rain that has occurred over the past several days as a major depression contributor. Pt denied plans to harm self and agreed to come back next week and resume use of his light box daily to help with depression symptoms.   Suicidal/Homicidal: Nowithout intent/plan  Therapist Response: Worked on goal of depression, Assessed overall level of depression per Pt self report, completed a safety assessment used cbt to challenge negative cognitive distortions, encouraged use of his light box and calender mood tracking.   Plan: Return again in 1 weeks.  Diagnosis: Axis I: Bipolar Disorder    Axis II: No diagnosis    Kutler Vanvranken E, LCSW 09/17/2013

## 2013-09-18 ENCOUNTER — Other Ambulatory Visit (HOSPITAL_COMMUNITY): Payer: Self-pay | Admitting: Psychiatry

## 2013-09-18 DIAGNOSIS — F319 Bipolar disorder, unspecified: Secondary | ICD-10-CM

## 2013-09-22 ENCOUNTER — Other Ambulatory Visit (HOSPITAL_COMMUNITY): Payer: Self-pay | Admitting: Psychiatry

## 2013-09-23 NOTE — Telephone Encounter (Signed)
Refill request for lamictal not appropriate, medication not prescribed to patient at this time.

## 2013-09-24 ENCOUNTER — Ambulatory Visit (INDEPENDENT_AMBULATORY_CARE_PROVIDER_SITE_OTHER): Payer: Medicare HMO | Admitting: Psychiatry

## 2013-09-24 ENCOUNTER — Telehealth (HOSPITAL_COMMUNITY): Payer: Self-pay | Admitting: *Deleted

## 2013-09-24 ENCOUNTER — Ambulatory Visit (INDEPENDENT_AMBULATORY_CARE_PROVIDER_SITE_OTHER): Payer: Medicare HMO | Admitting: *Deleted

## 2013-09-24 VITALS — BP 146/78 | HR 65 | Ht 69.0 in | Wt 257.8 lb

## 2013-09-24 DIAGNOSIS — F319 Bipolar disorder, unspecified: Secondary | ICD-10-CM

## 2013-09-24 MED ORDER — ARIPIPRAZOLE ER 400 MG IM SUSR
400.0000 mg | INTRAMUSCULAR | Status: DC
Start: 2013-09-24 — End: 2013-09-24

## 2013-09-24 NOTE — Telephone Encounter (Signed)
Pt left VM:Pt wanted MD to know that the 4th week following injection,he is very depressed.kay for the first 3 weeks, but the 4th week is a deep depression.Wants to know if any medication changes or anything else can be done?

## 2013-09-24 NOTE — Progress Notes (Signed)
   THERAPIST PROGRESS NOTE  Session Time: 2:30-3:20 pm  Participation Level: Active  Behavioral Response: Neat and Well GroomedAlertDepressed  Type of Therapy: Individual Therapy  Treatment Goals addressed: Diagnosis: Depression  Interventions: CBT and Solution Focused  Summary: Sean Hill is a 73 y.o. male who presents with depressed mood and affect. Pt reports increased depression from previous session with feelings of hopelessness, however Pt is aware of the symptoms of his depression due to his medication wearing off and Pt contacted his nurse to discuss options to add with medications. Pt has increased insight into his mood and behavior. Pt said he has tracked his mood daily on his calendar and plans to continue doing so to monitor symptoms. Pt denied thoughts and plans of suicide. Pt said he wants to still find something to do to "occupy his time" and Pt gave examples of what he has been considering. Pt agreed to look into volunteer opportunities in [place] before next session.    Suicidal/Homicidal: Nowithout intent/plan  Therapist Response: Worked on goal of depression, Assessed for safety and completed safety assessment, reviwed depression symptoms and identified a plan of action for talking with his psychiatrist about medication additions to help with final week of his injection and discussed volunteer opportunities.   Plan: Return again in 2 weeks.  Diagnosis: Axis I: Bipolar Disorder    Axis II: No diagnosis    Bowen Goyal E, LCSW 09/24/2013

## 2013-09-24 NOTE — Progress Notes (Deleted)
    Daily Group Progress Note  Program: {CHL AMB BH IOP/CDIOP Program Type:21022744}  Group Time:   Participation Level: {CHL AMB BH Group Participation:21022742}  Behavioral Response: {CHL AMB BH Group Behavior:21022743}  Type of Therapy:  {CHL AMB BH Type of Therapy:21022741}  Summary of Progress: ***     Group Time:   Participation Level:  {CHL AMB BH Group Participation:21022742}  Behavioral Response: {CHL AMB BH Group Behavior:21022743}  Type of Therapy: {CHL AMB BH Type of Therapy:21022741}  Summary of Progress: ***  Ijanae Macapagal E, LCSW

## 2013-09-24 NOTE — Progress Notes (Signed)
Abilify Maintena 400mg  injected, Right Upper Outer Quadrant Lot # [id number], sample from [place] Charted in progress note due to [facility] not available

## 2013-09-27 ENCOUNTER — Ambulatory Visit (HOSPITAL_COMMUNITY): Payer: Self-pay | Admitting: Psychiatry

## 2013-10-01 ENCOUNTER — Encounter (HOSPITAL_COMMUNITY): Payer: Self-pay | Admitting: Psychiatry

## 2013-10-01 ENCOUNTER — Telehealth (HOSPITAL_COMMUNITY): Payer: Self-pay | Admitting: *Deleted

## 2013-10-01 ENCOUNTER — Ambulatory Visit (HOSPITAL_COMMUNITY): Payer: Self-pay | Admitting: *Deleted

## 2013-10-01 ENCOUNTER — Ambulatory Visit (INDEPENDENT_AMBULATORY_CARE_PROVIDER_SITE_OTHER): Payer: Medicare HMO | Admitting: Psychiatry

## 2013-10-01 VITALS — BP 138/64 | HR 68 | Ht 70.0 in | Wt 259.6 lb

## 2013-10-01 DIAGNOSIS — F319 Bipolar disorder, unspecified: Secondary | ICD-10-CM

## 2013-10-01 DIAGNOSIS — G4700 Insomnia, unspecified: Secondary | ICD-10-CM

## 2013-10-01 MED ORDER — TEMAZEPAM 15 MG PO CAPS: 15.0000 mg | ORAL_CAPSULE | Freq: Every evening | ORAL | Status: DC | PRN

## 2013-10-01 MED ORDER — TRAZODONE HCL 100 MG PO TABS: 50.0000 mg | ORAL_TABLET | Freq: Every day | ORAL | Status: DC

## 2013-10-01 MED ORDER — BENZTROPINE MESYLATE 1 MG PO TABS: 1.0000 mg | ORAL_TABLET | Freq: Every day | ORAL | Status: DC

## 2013-10-01 NOTE — Telephone Encounter (Signed)
Pt left VO:[id number] tell Dr.Arfeen that LUX of his light box is 10,000.Patient states MD wanted to know to tell him how long to sit in front of it

## 2013-10-01 NOTE — Progress Notes (Signed)
[facility] [phone] Progress Note  Sean Hill [id number] 73 y.o.  10/01/2013 2:01 PM  Chief Complaint:  I'm feeling sad and depressed.  Sometime I cannot sleep.        History of Present Illness: Sean Hill came for his followup appointment with his wife Sean Hill .  This is the first time I meeting his wife.  Patient endorses lately feeling sad and depressed .  Patient reported that out of 3 weeks in a month he feels good but usually last week he feels very sad depressed and very anxious.  He also endorsed poor sleep .  He had a good Christmas.  Wife endorse after such a long time he was able to travel and visit patient's wife's sister in [place].  Wife endorsed that he is doing much better from the Abilify injection .  She also agreed that after the injection 3 weeks goal very well in fourth week he started to feel more anxious and nervous.  He is taking Cogentin 1 mg half tablet twice a day .  He does not feel that his tremors are getting worse.  He continues to have dry mouth and some time drinks water in the middle of the night.  He also received a letter from his psychiatrist from [place] .  Patient went to have ECT evaluation however it was determined that patient does not require ECT treatment at this time.  Patient is taking trazodone 50 mg at bedtime, temazepam 15 mg at bedtime and Cogentin 0.5 mg twice a day.  Denies any hallucination, paranoia or any aggression.  He feels sad and depressed .  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  Patient endorse that winter , cloudy weather and dark does make it more sad and depressed.  He enjoys summer more than mentors.  Life is questioning if patient can use light therapy patient is not drinking or using any illegal substances.  He seen [name] for counseling.  Denies any suicidal thoughts or homicidal thoughts.  He is happy because he is  able to loose some weight from the past however he is not taking Topamax.  Suicidal Ideation: No Plan Formed: No Patient has means to carry out plan: No  Homicidal Ideation: No Plan Formed: No Patient has means to carry out plan: No  Review of Systems  Psychiatric/Behavioral: Positive for depression. The patient is nervous/anxious.     Psychiatric: Agitation: No Hallucination: No Depressed Mood: Yes Insomnia: Yes Hypersomnia: No Altered Concentration: No Feels Worthless: No Grandiose Ideas: No Belief In Special Powers: No New/Increased Substance Abuse: No Compulsions: No  Neurologic: Headache: No Seizure: No Paresthesias: No  Medical History:  Patient has a history of high triglycerides, irritable bowel syndrome, chronic pain, GERD.    Psychosocial history.  Patient is [place]. His been married once. He has 2 daughters. Patient lives with his wife. His daughter lives close by. He has good relationship with his daughter.   Past Psychiatric History/Hospitalization(s): Patient has at least 4-5 psychiatric hospitalization.  His last psychiatric admission was in July 2014 .  He has a history of taking overdose on his medication and mixing with alcohol. He has a history of cutting his wrist. He is taking his psychiatric medication for past 8 years. In the past he has seen Jimmye Norman and Lajuana Ripple. As per chart he has  taken in the past Tegretol, Depakote, Ambien, Geodon, Abilify, lithium, Provigil, Zoloft, Neurontin , Lamictal, Wellbutrin , Risperdal and Prstiq.  He said he overdosed on Latuda. Patient do not recall very well the previous response of these medication. However he felt taking all these medications together was making him very drowsy groggy and unable to function. Patient endorsed history of mania and severe impulsive behavior. He admitted history of buying unnecessary, getting speeding tickets and aggression.  Patient reported history of  side effects due to polypharmacy.  Recently he had tried Remeron and Vistaril for insomnia but did not help him. Anxiety: Yes Bipolar Disorder: Yes Depression: Yes Mania: Yes Psychosis: Yes Schizophrenia: No Personality Disorder: No Hospitalization for psychiatric illness: Yes History of Electroconvulsive Shock Therapy: No Prior Suicide Attempts: Yes  Physical Exam: Constitutional:  BP 138/64  Pulse 68  Ht 5\' 10"  (1.778 m)  Wt 259 lb 9.6 oz (117.754 kg)  BMI 37.25 kg/m2   General Appearance: well nourished and obese  Musculoskeletal: Strength & Muscle Tone: within normal limits Gait & Station: normal Patient leans: Patient has normal posture  Psychiatric: Speech (describe rate, volume, coherence, spontaneity, and abnormalities if any): Slow but clear and coherent.  Thought Process (describe rate, content, abstract reasoning, and computation): Logical and goal-directed.  Associations: Relevant and Intact  Thoughts: normal and rummination  Mental Status: Orientation: oriented to person, place, time/date, situation, day of week, month of year and year Mood & Affect: depressed affect and anxiety Attention Span & Concentration: [place] Making (Choose Three): Established Problem, Stable/Improving (1), Established Problem, Worsening (2), Review of Last Therapy Session (1), Review of Medication Regimen & Side Effects (2) and Review of New Medication or Change in Dosage (2)  Assessment: Axis I: Bipolar disorder NOS  Axis II: Deferred  Axis III:  Patient Active Problem List   Diagnosis Date Noted  . Drug overdose 04/23/2013  . Respiratory failure with hypoxia 04/23/2013  . Acute encephalopathy 04/23/2013  . Gastroesophageal reflux disease 11/28/2011  . Bipolar disorder 11/25/2011    Axis IV: Moderate  Axis V: 60-65   Plan:  I recommend to increase trazodone 100 mg at bedtime to help his sleep and depression.  I also recommended to take Cogentin 1  mg one in the morning to avoid dry mouth in the night time.  He is not taking Topamax and I will discontinue.  Wife is questioning about light therapy and I don't see any problem to continue light therapy.  She endorse winter and darkness makes him more depressed.  Continue Abilify 400 mg IM every 4 weeks, temazepam 15 mg for insomnia.  Recommended to see [name] for coping and social skills.  Time spent 25 minutes.  More than 50% of the time spent in psychoeducation, counseling and coordination of care.  Discuss safety plan that anytime having active suicidal thoughts or homicidal thoughts then patient need to call 911 or go to the local emergency room.  Michalina Calbert T., MD 10/01/2013

## 2013-10-03 ENCOUNTER — Telehealth (HOSPITAL_COMMUNITY): Payer: Self-pay | Admitting: Psychiatry

## 2013-10-03 NOTE — Telephone Encounter (Signed)
Patient inquired about light therapy.  He has light 10,000 lux.  I recommend the use 10-15 times 3-4 times a week.

## 2013-10-11 ENCOUNTER — Other Ambulatory Visit (INDEPENDENT_AMBULATORY_CARE_PROVIDER_SITE_OTHER): Payer: Medicare HMO

## 2013-10-11 DIAGNOSIS — Z125 Encounter for screening for malignant neoplasm of prostate: Secondary | ICD-10-CM

## 2013-10-11 DIAGNOSIS — Z136 Encounter for screening for cardiovascular disorders: Secondary | ICD-10-CM

## 2013-10-11 DIAGNOSIS — F319 Bipolar disorder, unspecified: Secondary | ICD-10-CM

## 2013-10-11 DIAGNOSIS — Z0000 Encounter for general adult medical examination without abnormal findings: Secondary | ICD-10-CM

## 2013-10-11 LAB — HEPATIC FUNCTION PANEL
ALT: 26 U/L (ref 0–53)
AST: 20 U/L (ref 0–37)
Albumin: 3.8 g/dL (ref 3.5–5.2)
Alkaline Phosphatase: 78 U/L (ref 39–117)
BILIRUBIN TOTAL: 0.8 mg/dL (ref 0.3–1.2)
Bilirubin, Direct: 0.1 mg/dL (ref 0.0–0.3)
Total Protein: 6.7 g/dL (ref 6.0–8.3)

## 2013-10-11 LAB — BASIC METABOLIC PANEL
BUN: 15 mg/dL (ref 6–23)
CO2: 29 mEq/L (ref 19–32)
Calcium: 9.5 mg/dL (ref 8.4–10.5)
Chloride: 104 mEq/L (ref 96–112)
Creatinine, Ser: 1.1 mg/dL (ref 0.4–1.5)
GFR: 68.72 mL/min (ref >60.00)
Glucose, Bld: 81 mg/dL (ref 70–99)
Potassium: 4.5 mEq/L (ref 3.5–5.1)
SODIUM: 140 meq/L (ref 135–145)

## 2013-10-11 LAB — POCT URINALYSIS DIPSTICK
Bilirubin, UA: NEGATIVE
Blood, UA: NEGATIVE
Glucose, UA: NEGATIVE
Ketones, UA: NEGATIVE
Leukocytes, UA: NEGATIVE
Nitrite, UA: NEGATIVE
Protein, UA: NEGATIVE
Spec Grav, UA: 1.015
Urobilinogen, UA: 0.2
pH, UA: 6.5

## 2013-10-11 LAB — CBC WITH DIFFERENTIAL/PLATELET
Basophils Absolute: 0 10*3/uL (ref 0.0–0.1)
Basophils Relative: 0.4 % (ref 0.0–3.0)
Eosinophils Absolute: 0.1 10*3/uL (ref 0.0–0.7)
Eosinophils Relative: 1.3 % (ref 0.0–5.0)
HCT: 49.7 % (ref 39.0–52.0)
Hemoglobin: 16.8 g/dL (ref 13.0–17.0)
LYMPHS PCT: 33.9 % (ref 12.0–46.0)
Lymphs Abs: 2.7 10*3/uL (ref 0.7–4.0)
MCHC: 33.9 g/dL (ref 30.0–36.0)
MCV: 86 fl (ref 78.0–100.0)
MONOS PCT: 5.6 % (ref 3.0–12.0)
Monocytes Absolute: 0.4 10*3/uL (ref 0.1–1.0)
Neutro Abs: 4.7 10*3/uL (ref 1.4–7.7)
Neutrophils Relative %: 58.8 % (ref 43.0–77.0)
PLATELETS: 215 10*3/uL (ref 150.0–400.0)
RBC: 5.78 Mil/uL (ref 4.22–5.81)
RDW: 16 % — ABNORMAL HIGH (ref 11.5–14.6)
WBC: 7.9 10*3/uL (ref 4.5–10.5)

## 2013-10-11 LAB — LIPID PANEL
CHOLESTEROL: 243 mg/dL — AB (ref 0–200)
HDL: 44.3 mg/dL (ref >39.00)
TRIGLYCERIDES: 234 mg/dL — AB (ref 0.0–149.0)
Total CHOL/HDL Ratio: 5
VLDL: 46.8 mg/dL — AB (ref 0.0–40.0)

## 2013-10-11 LAB — LDL CHOLESTEROL, DIRECT: LDL DIRECT: 158.4 mg/dL

## 2013-10-11 LAB — PSA: PSA: 0.28 ng/mL (ref 0.10–4.00)

## 2013-10-11 LAB — TSH: TSH: 2.13 u[IU]/mL (ref 0.35–5.50)

## 2013-10-16 ENCOUNTER — Ambulatory Visit (INDEPENDENT_AMBULATORY_CARE_PROVIDER_SITE_OTHER): Payer: Medicare HMO | Admitting: Psychiatry

## 2013-10-16 DIAGNOSIS — F319 Bipolar disorder, unspecified: Secondary | ICD-10-CM

## 2013-10-16 NOTE — Progress Notes (Signed)
   THERAPIST PROGRESS NOTE  Session Time: 2:00-2:50 pm  Participation Level: Active  Behavioral Response: CasualAlertAnxious and Depressed  Type of Therapy: Individual Therapy  Treatment Goals addressed: Diagnosis: Depression and anxiety  Interventions: CBT and Solution Focused  Summary: Sean Hill is a 73 y.o. male who presents with severe depressed mood, flat affect, passive suicidal thinking without plan, attempt or means. Pt said his depression symptoms continue to be high with decreased energy, sad mood, feelings of hopeless and no motivation. Pt is able to contract for safety after completing a thorough safety assessment, but stated he wants to find something that will work to reduce his symptoms. Pt said he continues to sit with his light box twice daily for fifteen minutes and agreed to try to spend some more time outside, since he feels the lack of sun due to winter weather has increased his depression symptoms. Pt is hoping that he will be able to find something that will help maintain a more stable level of mood. Pt agrees to enact crisis plan if symptoms worsen and agrees to attend his next appointment to get his injection on Feb 3. Pt will resume sessions with Probation officer at the new [place] location.   Suicidal/Homicidal: Yeswithout intent/plan  Therapist Response: Assessed overall level of depression symptoms per pt self report, completed a thorough safety assessment, notified Dr. Adele Schilder and Inocencio Homes about the severity of pts symptoms and had Pt contract for safety.   Plan: Return again in 2 weeks at the [place] location. Pt agrees to enact crisis plan if necessary.   Diagnosis: Axis I: Bipolar Disorder    Axis II: No diagnosis    Roberta Angell E, LCSW 10/16/2013

## 2013-10-18 ENCOUNTER — Encounter: Payer: Medicare HMO | Admitting: Internal Medicine

## 2013-10-22 ENCOUNTER — Ambulatory Visit (INDEPENDENT_AMBULATORY_CARE_PROVIDER_SITE_OTHER): Payer: Medicare HMO | Admitting: *Deleted

## 2013-10-22 VITALS — BP 131/84 | HR 66 | Ht 70.0 in | Wt 259.8 lb

## 2013-10-22 DIAGNOSIS — F319 Bipolar disorder, unspecified: Secondary | ICD-10-CM

## 2013-10-22 MED ORDER — ARIPIPRAZOLE ER 400 MG IM SUSR: 400.0000 mg | INTRAMUSCULAR | Status: DC

## 2013-10-22 NOTE — Progress Notes (Signed)
Abilify Maintena 400mg  injected, Left Upper Outer Quadrant  Lot # [id number], sample from [place]  Charted in progress note due to [facility] not available

## 2013-10-25 ENCOUNTER — Telehealth (HOSPITAL_COMMUNITY): Payer: Self-pay

## 2013-10-29 ENCOUNTER — Ambulatory Visit (INDEPENDENT_AMBULATORY_CARE_PROVIDER_SITE_OTHER): Payer: Medicare HMO | Admitting: Psychiatry

## 2013-10-29 ENCOUNTER — Ambulatory Visit (HOSPITAL_COMMUNITY): Payer: Self-pay | Admitting: Psychiatry

## 2013-10-29 DIAGNOSIS — F319 Bipolar disorder, unspecified: Secondary | ICD-10-CM

## 2013-10-29 NOTE — Progress Notes (Signed)
   THERAPIST PROGRESS NOTE  Session Time: 10:00-10:50pm  Participation Level: Active  Behavioral Response: CasualAlertEuthymic  Type of Therapy: Individual Therapy  Treatment Goals addressed: Depression and Anxiety  Interventions: CBT and Solution Focused  Summary: Sean Hill is a 73 y.o. male who presents with elevated mood and affect. Pt states he received his injection last week for depression and felt improvement in mood within two days. Pt states he wonders if the reason the shot was not effective last month could be due to it being mixed up too early before it was injected. Pt said he had an appointment to get the shot and then was pulled into the therapy session and did not get the injection until an hour later. Pt states he was told by the nurse that the shot has a half life of thirty minutes once mixed. Pt is going to look into this with the nurse and also agrees to resume tracking his moods this month to identify if he mood begins to go down near the end of the months when the shot wears off. Pt denies any active depression and anxiety symptoms and states he is considering taking up fishing weekly as an outlet to get him out of the house.   Suicidal/Homicidal: No  Therapist Response: Assessed overall level of functioning per Pt self report. Explored possible causes for improved mood and reinforced continued use of daily coping skills for depression management. Discussed options to increase socialization.   Plan: Return again in 2 weeks.  Diagnosis: Axis I: Bipolar Disorder      Tresa Res, LCSW 10/29/2013

## 2013-10-30 ENCOUNTER — Ambulatory Visit (HOSPITAL_COMMUNITY): Payer: Self-pay | Admitting: Psychiatry

## 2013-10-31 ENCOUNTER — Encounter (HOSPITAL_COMMUNITY): Payer: Self-pay | Admitting: *Deleted

## 2013-10-31 NOTE — Progress Notes (Signed)
Cogentin 1 mg authorized by [facility] from 09/19/13 thru 09/18/14 Reference # [id number] Patient and Pharmacy notified by phone

## 2013-11-12 ENCOUNTER — Ambulatory Visit (HOSPITAL_COMMUNITY): Payer: Self-pay | Admitting: Psychiatry

## 2013-11-12 ENCOUNTER — Other Ambulatory Visit (HOSPITAL_COMMUNITY): Payer: Self-pay | Admitting: *Deleted

## 2013-11-12 NOTE — Telephone Encounter (Signed)
Refill not needed at this time. Last ordered 10/01/13 with one refill Per fax, last filled 11/02/13 Will fax response to pharmacy : Not needed at this time

## 2013-11-13 ENCOUNTER — Ambulatory Visit (HOSPITAL_COMMUNITY): Payer: Self-pay | Admitting: Psychiatry

## 2013-11-15 ENCOUNTER — Encounter: Payer: Medicare HMO | Admitting: Internal Medicine

## 2013-11-15 ENCOUNTER — Ambulatory Visit (INDEPENDENT_AMBULATORY_CARE_PROVIDER_SITE_OTHER): Payer: Medicare HMO | Admitting: Psychiatry

## 2013-11-15 DIAGNOSIS — F319 Bipolar disorder, unspecified: Secondary | ICD-10-CM

## 2013-11-15 NOTE — Progress Notes (Signed)
   THERAPIST PROGRESS NOTE  Session Time: 11:00-11:50 pm  Participation Level: Active  Behavioral Response: CasualAlertDepressed  Type of Therapy: Individual Therapy  Treatment Goals addressed: Bipolar Disorder and Too much unstructured time.   Interventions: CBT  Summary: Sean Hill is a 73 y.o. male who presents with severe depressed mood and affect. Pt reports feeling "ok" at the start of the session, but his physical appearance and affect appeared highly depressed. Pt started listing having all nine symptoms of depression and realized he was severely depressed and initially lacked insight into the severity. Pt completed the PHQ-9 depression screen and scored a 27/27. Pt said he has had thoughts of wanting to die, but denied plans attempts or means. Pt said his wife and their mortgage is keeping him from doing anything. Pt contracted for safety. Pt is focused mostly on finding the right medication and discussed ways he could communicate the severity of his depression more clearly at his next doctors appointment. Pt said he is also very upset about being overweight and increase in appetite, a side affect from his shot. Pt presented with hopeless thought processes's and struggled to see solutions for any of his difficulties. Pt said he was open to looking into other areas he can manage his depression in addition to medicaiton to discuss at the next session. Pt has not been consistent with the behavioral changes discussed in previous sessions. He is not consisently tracking mood and symptoms, getting out of the house or working on diet and exercise.    Suicidal/Homicidal: Yeswithout intent/plan  Therapist Response: Assessed overall level of depression symptoms through administering the PHQ-9 and by Pt self report. Assessed for safety and Pt contracted. Discussed skills of assertive communication to use at his next doctors appointment and educated and encouraged Pt start using a more  holistic approach to treating his Bipolar disorder instead of just focusing on the medication.   Plan: Return again in 2 weeks. Have Pt complete an assessment on how he is managing different areas of his life to treat the bipolar disorder, diet, exercise, social outings, thoughts, medication, etc. At next visit.   Diagnosis: Axis I: Bipolar Disorder       Tresa Res, LCSW 11/15/2013

## 2013-11-19 ENCOUNTER — Ambulatory Visit (INDEPENDENT_AMBULATORY_CARE_PROVIDER_SITE_OTHER): Payer: Medicare HMO | Admitting: *Deleted

## 2013-11-19 VITALS — BP 135/74 | HR 70 | Ht 70.0 in | Wt 262.0 lb

## 2013-11-19 DIAGNOSIS — F319 Bipolar disorder, unspecified: Secondary | ICD-10-CM

## 2013-11-19 NOTE — Progress Notes (Signed)
Abilify Maintena 400 mg, IM Right Upper Outer Quadrant  Lot # [id number]  Sample from [place]

## 2013-11-22 ENCOUNTER — Encounter: Payer: Medicare HMO | Admitting: Family

## 2013-11-26 ENCOUNTER — Ambulatory Visit (INDEPENDENT_AMBULATORY_CARE_PROVIDER_SITE_OTHER): Payer: Medicare HMO | Admitting: Psychiatry

## 2013-11-26 ENCOUNTER — Ambulatory Visit (HOSPITAL_COMMUNITY): Payer: Self-pay | Admitting: Psychiatry

## 2013-11-26 DIAGNOSIS — F319 Bipolar disorder, unspecified: Secondary | ICD-10-CM

## 2013-11-26 NOTE — Progress Notes (Signed)
   THERAPIST PROGRESS NOTE  Session Time: 10:00-10:55 am  Participation Level: Active  Behavioral Response: CasualAlertAnxious and Depressed  Type of Therapy: Individual Therapy  Treatment Goals addressed: Bipolar Disorder and Too much unstructured time  Interventions: CBT and Solution Focused  Summary: Sean Hill is a 73 y.o. male who presents with severe depressed mood and affect. Pt scored a 27/27 on the PHQ-9 depression inventory. Pt reports severe depression symptoms including; sleeping most of the day and night, no motivation or interest and severe depressed mood with thoughts of suicide. Pt denies plans, attempts or means and said "i could never kill myself because of my obligation to my wife".  Pt contracted for safety and agreed to enact his crisis plan if symptoms worsen or he feels unsafe. Pt said he has informed his wife about the severity of his depression. Pt said he is not getting out of the house unless he has an obligation or appointment and almost cancelled his appointment today due not wanting to put forth the effort. Pt said he is disgusted by his weight gain and has stopped eating for the past two days in a quick attempt to loose weight. Pt said he wants "quick results". He said he struggles with engaging in behavioral changes that require any effort on his part. Pt agreed to resume eating, get outside daily and contact his psychiatrist to see if there is an earlier appointment.   Suicidal/Homicidal: Yes, without plans, means or attempts  Therapist Response: Assessed overall level of depression symptoms per Pt self report and the PHQ-9. Completed safety assessment and dicussed talking to psychiatrist again about ECT or [place] since he is now in an active state of depression. Challenged negative distorted thinking about catastrophizing about the future and educated on the need to start making behavioral changes to impact depression symptoms, explored barriers to making  behavioral changes.   Plan: Return again in 2 weeks.  Diagnosis: Axis I: Bipolar Disorder        Tresa Res, LCSW 11/26/2013

## 2013-11-27 ENCOUNTER — Ambulatory Visit (HOSPITAL_COMMUNITY): Payer: Self-pay | Admitting: Psychiatry

## 2013-11-29 ENCOUNTER — Ambulatory Visit (INDEPENDENT_AMBULATORY_CARE_PROVIDER_SITE_OTHER): Payer: Medicare HMO | Admitting: Psychiatry

## 2013-11-29 ENCOUNTER — Encounter (HOSPITAL_COMMUNITY): Payer: Self-pay | Admitting: Psychiatry

## 2013-11-29 VITALS — BP 140/78 | HR 76 | Ht 70.0 in | Wt 260.4 lb

## 2013-11-29 DIAGNOSIS — F319 Bipolar disorder, unspecified: Secondary | ICD-10-CM

## 2013-11-29 DIAGNOSIS — G4700 Insomnia, unspecified: Secondary | ICD-10-CM

## 2013-11-29 MED ORDER — CLONAZEPAM 0.5 MG PO TABS: 0.5000 mg | ORAL_TABLET | Freq: Every day | ORAL | Status: DC

## 2013-11-29 MED ORDER — TRAZODONE HCL 100 MG PO TABS: 100.0000 mg | ORAL_TABLET | Freq: Every day | ORAL | Status: DC

## 2013-11-29 MED ORDER — BENZTROPINE MESYLATE 1 MG PO TABS: 1.0000 mg | ORAL_TABLET | Freq: Every day | ORAL | Status: DC

## 2013-11-29 MED ORDER — TEMAZEPAM 15 MG PO CAPS: 15.0000 mg | ORAL_CAPSULE | Freq: Every evening | ORAL | Status: DC | PRN

## 2013-11-29 MED ORDER — ESCITALOPRAM OXALATE 10 MG PO TABS: 10.0000 mg | ORAL_TABLET | Freq: Every day | ORAL | Status: DC

## 2013-11-29 NOTE — Progress Notes (Signed)
Sean Hill [phone] Progress Note  Sean Hill [id number] 73 y.o.  11/29/2013 10:00 AM  Chief Complaint:  I still feel very sad depressed and anxious.          History of Present Illness: Sean Hill came for his followup appointment.  He is compliant with his psychotropic medication but he continued to be very anxious depressed and sad.  He sometimes feels hopeless and helpless.  He admitted some time passive suicidal thoughts but no plan.  He does not want to hurt his wife and he wants to live.  He does not feel his current medication is working very well.  He does feel better with Abilify injection but he feels very anxious nervous and jittery.  He admitted social isolation, withdrawn and anhedonia.  He wants to try a different medication which he can take that the Abilify.  He has ECT evaluation in the past however it was determined that he does not require ECT treatment at that time.  Patient is seeing [name] on her regular basis.  He has good support from his family.  He wants to get better.  He denies any hallucination or any paranoia.  He denies any agitation.  He is not drinking or using any illegal substances.  He tried using light therapy which helps him but he continues to feel some time sad and tearful.  We have increased her trazodone in the past which helped some of the sleep but also complaining of dry mouth.  He is taking temazepam but is helping his sleep.  He is taking Cogentin to help the tremor is coming from Abilify.  His tremors are much improved from the past.  Suicidal Ideation: Patient endorse passive and fleeting thoughts of suicide but no plan. Plan Formed: No Patient has means to carry out plan: No  Homicidal Ideation: No Plan Formed: No Patient has means to carry out plan: No  Review of Systems  HENT:       Dry mouth  Psychiatric/Behavioral: Positive for depression. The patient is nervous/anxious.     Psychiatric: Agitation: No Hallucination:  No Depressed Mood: Yes Insomnia: Yes Hypersomnia: No Altered Concentration: No Feels Worthless: No Grandiose Ideas: No Belief In Special Powers: No New/Increased Substance Abuse: No Compulsions: No  Neurologic: Headache: No Seizure: No Paresthesias: No  Medical History:  Patient has a history of high triglycerides, irritable bowel syndrome, chronic pain, GERD.    Psychosocial history.  Patient is born and raised [place]. His been married once. He has 2 daughters. Patient lives with his wife. His daughter lives close by. He has good relationship with his daughter.   Past Psychiatric History/Hospitalization(s): Patient has at least 4-5 psychiatric hospitalization.  His last psychiatric admission was in July 2014 .  He has a history of taking overdose on his medication and mixing with alcohol. He has a history of cutting his wrist. He is taking his psychiatric medication for past 8 years. In the past he has seen Jimmye Norman and Lajuana Ripple. As per chart he has taken in the past Tegretol, Depakote, Ambien, Geodon, Abilify, lithium, Provigil, Zoloft, Neurontin , Lamictal, Wellbutrin , Risperdal and Prstiq.  He said he overdosed on Latuda. Patient do not recall very well the previous response of these medication. However he felt taking all these medications together was making him very drowsy groggy and unable to function. Patient endorsed history of mania and severe impulsive behavior. He admitted history of buying unnecessary, getting speeding tickets and  aggression.  Patient reported history of side effects due to polypharmacy.  Recently he had tried Remeron and Vistaril for insomnia but did not help him. Anxiety: Yes Bipolar Disorder: Yes Depression: Yes Mania: Yes Psychosis: Yes Schizophrenia: No Personality Disorder: No Hospitalization for psychiatric illness: Yes History of Electroconvulsive Shock Therapy: No Prior Suicide Attempts: Yes  Physical  Exam: Constitutional:  BP 140/78  Pulse 76  Ht 5\' 10"  (1.778 m)  Wt 260 lb 6.4 oz (118.117 kg)  BMI 37.36 kg/m2   General Appearance: well nourished and obese  Musculoskeletal: Strength & Muscle Tone: within normal limits Gait & Station: normal Patient leans: Patient has normal posture  Mental status examination Patient is casually dressed and fairly groomed.  He appeared anxious and nervous.  He described his mood as sad and depressed.  He admitted some time hopeless feeling denies any active suicidal thoughts or plan.  He denies any hallucinations or any paranoia.  His psychomotor activity is slightly decreased.  His speech is slow but clear and coherent.  His fund of knowledge is good.  His thought processes slow but logical and goal directed.  He denies any auditory or visual hallucination.  There were no delusions or any paranoia.  His attention and concentration is fair.  He described his mood as depressed and his affect is constricted.  He is alert and oriented x3.  His memory is good.  His insight judgment and impulse control is okay.  Established Problem, Stable/Improving (1), New problem, with additional work up planned, Review of Psycho-Social Stressors (1), Established Problem, Worsening (2), Review of Last Therapy Session (1), Review of Medication Regimen & Side Effects (2) and Review of New Medication or Change in Dosage (2)  Assessment: Axis I: Bipolar disorder NOS  Axis II: Deferred  Axis III:  Patient Active Problem List   Diagnosis Date Noted  . Drug overdose 04/23/2013  . Respiratory failure with hypoxia 04/23/2013  . Acute encephalopathy 04/23/2013  . Gastroesophageal reflux disease 11/28/2011  . Bipolar disorder 11/25/2011    Axis IV: Moderate  Axis V: 60-65   Plan:  I do believe patient is experiencing increased depression anxiety from the past.  In the past he had tried Klonopin which helps his anxiety .  I reviewed his record .  He has never tried  Lexapro .  I will add Klonopin 0.5 mg as needed for severe anxiety and also start Lexapro 10 mg daily to help her depression and anxiety symptoms.  Continue Abilify injection, trazodone, temazepam and Cogentin at present dose.  However we will consider taking him off from temazepam and Klonopin also helps sleep.  Discussed in detail the risks and benefits of medication.  Encouraged to see a therapist a regular basis.  Discuss safety plan that anytime having active suicidal parts of homicidal problem he need to call 911 of emergency room.  I also offered intensive outpatient program but patient declined due to financial restrain and does not feel improvement with the program in the past.  I will see him again in 3 weeks.  I strongly recommended to call us back if he has a question of if he feels worse thing of the symptom. Time spent 25 minutes.  More than 50% of the time spent in psychoeducation, counseling and coordination of care.    ARFEEN,SYED T., MD 11/29/2013

## 2013-12-02 ENCOUNTER — Encounter: Payer: Self-pay | Admitting: Family

## 2013-12-02 ENCOUNTER — Ambulatory Visit (INDEPENDENT_AMBULATORY_CARE_PROVIDER_SITE_OTHER): Payer: Medicare HMO | Admitting: Family

## 2013-12-02 VITALS — BP 130/60 | HR 87 | Ht 67.75 in | Wt 260.0 lb

## 2013-12-02 DIAGNOSIS — K219 Gastro-esophageal reflux disease without esophagitis: Secondary | ICD-10-CM

## 2013-12-02 DIAGNOSIS — E785 Hyperlipidemia, unspecified: Secondary | ICD-10-CM

## 2013-12-02 DIAGNOSIS — E669 Obesity, unspecified: Secondary | ICD-10-CM

## 2013-12-02 DIAGNOSIS — F319 Bipolar disorder, unspecified: Secondary | ICD-10-CM

## 2013-12-02 MED ORDER — SIMVASTATIN 20 MG PO TABS: 20.0000 mg | ORAL_TABLET | Freq: Every day | ORAL | Status: DC

## 2013-12-02 NOTE — Patient Instructions (Signed)
Fat and Cholesterol Control Diet  Fat and cholesterol levels in your blood and organs are influenced by your diet. High levels of fat and cholesterol may lead to diseases of the heart, small and large blood vessels, gallbladder, liver, and pancreas.  CONTROLLING FAT AND CHOLESTEROL WITH DIET  Although exercise and lifestyle factors are important, your diet is key. That is because certain foods are known to raise cholesterol and others to lower it. The goal is to balance foods for their effect on cholesterol and more importantly, to replace saturated and trans fat with other types of fat, such as monounsaturated fat, polyunsaturated fat, and omega-3 fatty acids.  On average, a person should consume no more than 15 to 17 g of saturated fat daily. Saturated and trans fats are considered "bad" fats, and they will raise LDL cholesterol. Saturated fats are primarily found in animal products such as meats, butter, and cream. However, that does not mean you need to give up all your favorite foods. Today, there are good tasting, low-fat, low-cholesterol substitutes for most of the things you like to eat. Choose low-fat or nonfat alternatives. Choose round or loin cuts of red meat. These types of cuts are lowest in fat and cholesterol. Chicken (without the skin), fish, veal, and ground turkey breast are great choices. Eliminate fatty meats, such as hot dogs and salami. Even shellfish have little or no saturated fat. Have a 3 oz (85 g) portion when you eat lean meat, poultry, or fish.  Trans fats are also called "partially hydrogenated oils." They are oils that have been scientifically manipulated so that they are solid at room temperature resulting in a longer shelf life and improved taste and texture of foods in which they are added. Trans fats are found in stick margarine, some tub margarines, cookies, crackers, and baked goods.   When baking and cooking, oils are a great substitute for butter. The monounsaturated oils are  especially beneficial since it is believed they lower LDL and raise HDL. The oils you should avoid entirely are saturated tropical oils, such as coconut and palm.   Remember to eat a lot from food groups that are naturally free of saturated and trans fat, including fish, fruit, vegetables, beans, grains (barley, rice, couscous, bulgur wheat), and pasta (without cream sauces).   IDENTIFYING FOODS THAT LOWER FAT AND CHOLESTEROL   Soluble fiber may lower your cholesterol. This type of fiber is found in fruits such as apples, vegetables such as broccoli, potatoes, and carrots, legumes such as beans, peas, and lentils, and grains such as barley. Foods fortified with plant sterols (phytosterol) may also lower cholesterol. You should eat at least 2 g per day of these foods for a cholesterol lowering effect.   Read package labels to identify low-saturated fats, trans fat free, and low-fat foods at the supermarket. Select cheeses that have only 2 to 3 g saturated fat per ounce. Use a heart-healthy tub margarine that is free of trans fats or partially hydrogenated oil. When buying baked goods (cookies, crackers), avoid partially hydrogenated oils. Breads and muffins should be made from whole grains (whole-wheat or whole oat flour, instead of "flour" or "enriched flour"). Buy non-creamy canned soups with reduced salt and no added fats.   FOOD PREPARATION TECHNIQUES   Never deep-fry. If you must fry, either stir-fry, which uses very little fat, or use non-stick cooking sprays. When possible, broil, bake, or roast meats, and steam vegetables. Instead of putting butter or margarine on vegetables, use lemon   and herbs, applesauce, and cinnamon (for squash and sweet potatoes). Use nonfat yogurt, salsa, and low-fat dressings for salads.   LOW-SATURATED FAT / LOW-FAT FOOD SUBSTITUTES  Meats / Saturated Fat (g)  · Avoid: Steak, marbled (3 oz/85 g) / 11 g  · Choose: Steak, lean (3 oz/85 g) / 4 g  · Avoid: Hamburger (3 oz/85 g) / 7  g  · Choose: Hamburger, lean (3 oz/85 g) / 5 g  · Avoid: Ham (3 oz/85 g) / 6 g  · Choose: Ham, lean cut (3 oz/85 g) / 2.4 g  · Avoid: Chicken, with skin, dark meat (3 oz/85 g) / 4 g  · Choose: Chicken, skin removed, dark meat (3 oz/85 g) / 2 g  · Avoid: Chicken, with skin, light meat (3 oz/85 g) / 2.5 g  · Choose: Chicken, skin removed, light meat (3 oz/85 g) / 1 g  Dairy / Saturated Fat (g)  · Avoid: Whole milk (1 cup) / 5 g  · Choose: Low-fat milk, 2% (1 cup) / 3 g  · Choose: Low-fat milk, 1% (1 cup) / 1.5 g  · Choose: Skim milk (1 cup) / 0.3 g  · Avoid: Hard cheese (1 oz/28 g) / 6 g  · Choose: Skim milk cheese (1 oz/28 g) / 2 to 3 g  · Avoid: Cottage cheese, 4% fat (1 cup) / 6.5 g  · Choose: Low-fat cottage cheese, 1% fat (1 cup) / 1.5 g  · Avoid: Ice cream (1 cup) / 9 g  · Choose: Sherbet (1 cup) / 2.5 g  · Choose: Nonfat frozen yogurt (1 cup) / 0.3 g  · Choose: Frozen fruit bar / trace  · Avoid: Whipped cream (1 tbs) / 3.5 g  · Choose: Nondairy whipped topping (1 tbs) / 1 g  Condiments / Saturated Fat (g)  · Avoid: Mayonnaise (1 tbs) / 2 g  · Choose: Low-fat mayonnaise (1 tbs) / 1 g  · Avoid: Butter (1 tbs) / 7 g  · Choose: Extra light margarine (1 tbs) / 1 g  · Avoid: Coconut oil (1 tbs) / 11.8 g  · Choose: Olive oil (1 tbs) / 1.8 g  · Choose: Corn oil (1 tbs) / 1.7 g  · Choose: Safflower oil (1 tbs) / 1.2 g  · Choose: Sunflower oil (1 tbs) / 1.4 g  · Choose: Soybean oil (1 tbs) / 2.4 g  · Choose: Canola oil (1 tbs) / 1 g  Document Released: 09/05/2005 Document Revised: 12/31/2012 Document Reviewed: 02/24/2011  Exit[employer]® Patient Information ©2014 [employer].

## 2013-12-02 NOTE — Progress Notes (Signed)
Subjective:    Patient ID: Sean Hill, male    DOB: 1941/02/11, 73 y.o.   MRN: [id number]  HPI 73 y.o. White male presents today for a physical. presents today for a physical. Pt has hx or arthritis, asthma, GERD, Depression, hyperlipidemia, kidney disease. Pt was also treated in ICU for attempted suicide in August by overdosing on his medication. Discussed routine exams such as colonoscopy, yearly eye exams and immunizations. Current medications were reviewed with pt. Pt states that he is not currently depressed and has no plan for suicide. Denies fever, fatigue, malaise. Acknowledges increased appetite since starting psych meds.     Review of Systems  Constitutional: Positive for appetite change.       Acknowledges increased appetite.   HENT: Negative.   Eyes: Negative.   Respiratory: Negative.   Cardiovascular: Negative.   Gastrointestinal: Negative.   Endocrine: Negative.   Genitourinary: Negative.   Musculoskeletal: Negative.   Allergic/Immunologic: Negative.   Neurological: Negative.   Hematological: Negative.   Psychiatric/Behavioral: The patient is nervous/anxious.        Acknowledges depression.    Past Medical History  Diagnosis Date  . Bipolar disorder     Has psychiatrist.  BH admission (suicidal) 07/20/11.  Marland Kitchen Hyperlipemia     Intol of meds: GI side effects  . 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  No hx of MRSA infection.  . 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 Nephrolithiasis   . GERD (gastroesophageal reflux disease)   . Inguinal hernia 02/25/11    Left sided hernia with fat  . Hip pain, left 2011-2012    Left hip and groin: MRI pelvis and left hip 02/18/11 showed NORMAL hip, with small inguinal hernia containing fat and sigmoid diverticulosis.  Hip pain presumably referred pain from hernia/diverticular dz??.  . Cervical spondylosis     Primarily C4-5 and C5-6--referred to [facility] 02/2011.  .  Depression   . Bipolar disorder, current episode depressed, moderate   . Sleep apnea   . Asthma   . Overdose of benzodiazepine 04/30/2013    Status post    History   Social History  . Marital Status: Married    Spouse Name: N/A    Number of Children: N/A  . Years of Education: N/A   Occupational History  . Not on file.   Social History Main Topics  . Smoking status: Never Smoker   . Smokeless tobacco: Not on file  . Alcohol Use: No     Comment: One drink rarely  . Drug Use: No  . Sexual Activity: Yes    Birth Control/ Protection: None     Comment: Same partner for 45 years   Other Topics Concern  . Not on file   Social History Narrative   Married, lives in [place].  Retired Dance movement psychotherapist.   Two daughters, both married, 2 grandchildren.   No regular exercise.  Never smoker.  No ETOH.  No drugs.    Past Surgical History  Procedure Laterality Date  . 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 Ankle fracture surgery      hardware still in both ankles (titanium)  . Inguinal hernia repair      Right side with mesh about 1993  . Elbow surgery      left---tendon  . Ankle surgery      Ankle tendon surgery  . Tonsillectomy    .  Appendectomy      Family History  Problem Relation Age of Onset  . Alcohol abuse Mother   . Alcohol abuse Father   . Depression Daughter   . Paranoid behavior Daughter     Allergies  Allergen Reactions  . Codeine   . Penicillins Hives and Other (See Comments)    Watery blisters    Current Outpatient Prescriptions on File Prior to Visit  Medication Sig Dispense Refill  . ARIPiprazole (ABILIFY MAINTENA) 400 MG SUSR Inject 400 mg into the muscle every 30 (thirty) days.  1 each  0  . benztropine (COGENTIN) 1 MG tablet Take 1 tablet (1 mg total) by mouth daily.  30 tablet  0  . clonazePAM (KLONOPIN) 0.5 MG tablet Take 1 tablet (0.5 mg total) by mouth daily. For severe anxiety  15 tablet  0  . escitalopram (LEXAPRO) 10  MG tablet Take 1 tablet (10 mg total) by mouth daily.  30 tablet  0  . omeprazole (PRILOSEC) 40 MG capsule Take one capsule by mouth twice daily  60 capsule  3  . temazepam (RESTORIL) 15 MG capsule Take 1 capsule (15 mg total) by mouth at bedtime as needed for sleep.  30 capsule  0  . traZODone (DESYREL) 100 MG tablet Take 1 tablet (100 mg total) by mouth at bedtime.  30 tablet  0   No current facility-administered medications on file prior to visit.    BP 130/60  Pulse 87  Ht 5' 7.75" (1.721 m)  Wt 260 lb (117.935 kg)  BMI 39.82 kg/m2  SpO2 99%chart     Objective:   Physical Exam  Constitutional: He is oriented to person, place, and time. He appears well-developed and well-nourished. He is active.  HENT:  Head: Normocephalic.  Right Ear: Tympanic membrane, external ear and ear canal normal.  Left Ear: Tympanic membrane, external ear and ear canal normal.  Nose: Nose normal.  Mouth/Throat: Uvula is midline, oropharynx is clear and moist and mucous membranes are normal.  Eyes: Conjunctivae, EOM and lids are normal. Pupils are equal, round, and reactive to light.  Neck: Trachea normal and normal range of motion. Neck supple.  Cardiovascular: Normal rate, regular rhythm, normal heart sounds, intact distal pulses and normal pulses.   Abdominal: Soft. Normal appearance and bowel sounds are normal.  Genitourinary: Rectum normal, prostate normal, testes normal and penis normal. Guaiac negative stool. Cremasteric reflex is present. Circumcised.  Musculoskeletal: Normal range of motion.  Neurological: He is alert and oriented to person, place, and time. He has normal reflexes.  Skin: Skin is warm, dry and intact.  Psychiatric: His speech is normal. Thought content normal. He is slowed. He exhibits a depressed mood.          Assessment & Plan:  73 y.o. White male presents today for a physical.  - Hyperlipidemia: Start Simvastatin 20mg  p.o. Daily  - Depression: Managed by behavioral  health. Currently denies suicide ideation.  - Obesity: Advised patient to exercise daily, and change diet to a low fat and cholesterol diet.  - Labs: discussed pertinent lab findings including elevated lipids.   - Education: Exercise every day, start with brisk walking and gradually increase to help control weight. Eat a diet low in saturated fats and cholesterol.   Follow up: 6 week lab follow up for lipid panel.

## 2013-12-10 ENCOUNTER — Ambulatory Visit (HOSPITAL_COMMUNITY): Payer: Self-pay | Admitting: Psychiatry

## 2013-12-11 ENCOUNTER — Ambulatory Visit (HOSPITAL_COMMUNITY): Payer: Self-pay | Admitting: Psychiatry

## 2013-12-18 ENCOUNTER — Ambulatory Visit (INDEPENDENT_AMBULATORY_CARE_PROVIDER_SITE_OTHER): Payer: Medicare HMO | Admitting: Psychiatry

## 2013-12-18 DIAGNOSIS — F319 Bipolar disorder, unspecified: Secondary | ICD-10-CM

## 2013-12-18 NOTE — Progress Notes (Signed)
   THERAPIST PROGRESS NOTE  Session Time: 1:00-1:50 pm  Participation Level: Active  Behavioral Response: Casual, Neat and Well GroomedAlertEuthymic  Type of Therapy: Individual Therapy  Treatment Goals addressed: Bipolar Disorder  Interventions: CBT, Strength-based and Supportive  Summary: SYAIR FRICKER is a 73 y.o. male who presents with elevated mood and affect and reports feeling "very good" for the past two weeks. Pt denied any active depression, anxiety or manic symptoms and questions if the addition of Lexapro could be making the difference. Pt said he is getting out of the house daily and feels optimistic. Pt agreed that his weight is an issue and that he needs to get some exercise Pt said his wife has been trying to get him to attend a water Zumba class, but he has been reluctant. Pt agreed to give thought to attending.    Suicidal/Homicidal: No  Therapist Response: Assessed overall level of functioning per Pt self report, reviewed progress since last session and explored possibilities for improved mood including weather change into Spring, reviewed depression coping skills and encouraged continued daily activity and exercise.   Plan: Return again in 2 weeks.  Diagnosis: Axis I: Bipolar Disorder     Tresa Res, LCSW 12/18/2013

## 2013-12-24 ENCOUNTER — Ambulatory Visit (INDEPENDENT_AMBULATORY_CARE_PROVIDER_SITE_OTHER): Payer: Medicare HMO | Admitting: Psychiatry

## 2013-12-24 ENCOUNTER — Ambulatory Visit (INDEPENDENT_AMBULATORY_CARE_PROVIDER_SITE_OTHER): Payer: Medicare HMO | Admitting: *Deleted

## 2013-12-24 VITALS — BP 122/73 | HR 68 | Ht 68.0 in | Wt 259.8 lb

## 2013-12-24 DIAGNOSIS — F319 Bipolar disorder, unspecified: Secondary | ICD-10-CM

## 2013-12-24 NOTE — Progress Notes (Signed)
Abilify Maintena 400 mg, IM  Left [address]  Lot # [id number]  Sample from [place]  Patient reports elevation in mood for past 3 weeks, thinks it is because of starting Lexapro

## 2013-12-24 NOTE — Progress Notes (Signed)
   THERAPIST PROGRESS NOTE  Session Time: 10:00-10:50 am  Participation Level: Active  Behavioral Response: Casual, Neat and Well GroomedAlertEuthymic  Type of Therapy: Individual Therapy  Treatment Goals addressed: Bipolar and Too much unstructured time  Interventions: CBT, Solution Focused, Strength-based and Supportive  Summary: Sean Hill is a 73 y.o. male who presents with stable mood and affect. Pt continues to report elevated mood without depressive or manic symptoms and appears to be at baseline. He is scheduled for his mental health injectable medication today. He said he continues to get out of the house daily and said he went to the garden center and purchased two plants he plans to plant in his front yard as soon as the weather remains stable. Pt said he feels "good" and reviewed his progress in treatment. Pt identified that in the past eight years he has remained stable with only one suicide attempt and he identified that his mania has subsided significantly and he also is able to interact more effectively in social situations. Pt said he is grateful for the progress he has made.    Suicidal/Homicidal: No  Therapist Response: assessed overall level of functioning per Pt self report, explored causes for stable mood and reviewed progress with mood stability, encouraged continued daily social interaction and outings and time outside, reviewed depression coping skills and encouraged continued use.   Plan: Return again in 1 week. Pt said he does not plan to continue with another provider while writer is on maternity leave.   Diagnosis: Axis I: Bipolar Disorder       Tresa Res, LCSW 12/24/2013

## 2013-12-25 ENCOUNTER — Other Ambulatory Visit: Payer: Self-pay | Admitting: Internal Medicine

## 2013-12-25 ENCOUNTER — Other Ambulatory Visit (HOSPITAL_COMMUNITY): Payer: Self-pay | Admitting: Psychiatry

## 2013-12-25 DIAGNOSIS — F319 Bipolar disorder, unspecified: Secondary | ICD-10-CM

## 2013-12-30 ENCOUNTER — Ambulatory Visit (INDEPENDENT_AMBULATORY_CARE_PROVIDER_SITE_OTHER): Payer: Medicare HMO | Admitting: Psychiatry

## 2013-12-30 DIAGNOSIS — F319 Bipolar disorder, unspecified: Secondary | ICD-10-CM

## 2013-12-30 NOTE — Progress Notes (Signed)
   THERAPIST PROGRESS NOTE  Session Time: 11:00-11:50 am  Participation Level: Active  Behavioral Response: Casual, Neat and Well GroomedAlertEuthymic  Type of Therapy: Individual Therapy  Treatment Goals addressed: Bipolar Disorder and unstructured time  Interventions: CBT, Solution Focused and Supportive  Summary: Sean Hill is a 73 y.o. male who presents with elevated mood and affect and continues to be functioning at a high level of wellness. Pt was smiling and said he "feels good". 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 Pt said he questions himself because he feels so good and this behavior is so outside of his normal character. Pt said he continues to get outside the house daily, has been buying more plants and planting them at his home and is being more social. Pt said he will try to add in some weekly exercise as his next goal for maintaining his wellness. He expressed some concerns about his depression returning, but he was able to challenge it and return to his present state of thinking. Pts thoughts were positive in nature.    Suicidal/Homicidal: No  Therapist Response: Assessed overall level of functioning, reviewed progress and stability in treatment, discussed behavior changes contributing to stabilized mood, encouraged continued use of these skills and encouraged weekly exercise be added in.   Plan: Return again in 1 weeks.  Diagnosis: Axis I: Bipolar Disorder       Tresa Res, LCSW 12/30/2013

## 2013-12-31 ENCOUNTER — Ambulatory Visit (INDEPENDENT_AMBULATORY_CARE_PROVIDER_SITE_OTHER): Payer: Medicare HMO | Admitting: Psychiatry

## 2013-12-31 ENCOUNTER — Encounter (HOSPITAL_COMMUNITY): Payer: Self-pay | Admitting: Psychiatry

## 2013-12-31 VITALS — BP 145/66 | HR 66 | Ht 68.0 in | Wt 262.8 lb

## 2013-12-31 DIAGNOSIS — F319 Bipolar disorder, unspecified: Secondary | ICD-10-CM

## 2013-12-31 DIAGNOSIS — G4700 Insomnia, unspecified: Secondary | ICD-10-CM

## 2013-12-31 MED ORDER — TEMAZEPAM 15 MG PO CAPS: 15.0000 mg | ORAL_CAPSULE | Freq: Every evening | ORAL | Status: DC | PRN

## 2013-12-31 MED ORDER — TRAZODONE HCL 100 MG PO TABS: 100.0000 mg | ORAL_TABLET | Freq: Every day | ORAL | Status: DC

## 2013-12-31 MED ORDER — ESCITALOPRAM OXALATE 10 MG PO TABS: ORAL_TABLET | ORAL | Status: DC

## 2013-12-31 MED ORDER — BENZTROPINE MESYLATE 1 MG PO TABS: 1.0000 mg | ORAL_TABLET | Freq: Every day | ORAL | Status: DC

## 2013-12-31 NOTE — Progress Notes (Signed)
[facility] [phone] Progress Note  Sean Hill [id number] 73 y.o.  12/31/2013 3:57 PM  Chief Complaint:  I am feeling better with Lexapro.            History of Present Illness: Sean Hill came for his followup appointment.  On his last visit we started him on Lexapro 10 mg.  He is doing much better with the Lexapro.  He is more energetic and less depressed.  We also started him on Klonopin however he has not taken more than twice.  He is sleeping good but temazepam and trazodone.  He is more motivated and less anxious.  His seeing therapist for counseling.  He denies any crying spells, any feeling of hopelessness or worthlessness.  He was to continue Lexapro along with other psychotropic medication.  Is getting injection Abilify every 4 weeks.  He has no tremors or shakes.  Patient does not report any side effects of medication.  He denies any paranoia hallucination.  He is not drinking or using any illegal substances.  Suicidal Ideation: No Plan Formed: No Patient has means to carry out plan: No  Homicidal Ideation: No Plan Formed: No Patient has means to carry out plan: No  Review of Systems  HENT:       Dry mouth    Psychiatric: Agitation: No Hallucination: No Depressed Mood: No Insomnia: No Hypersomnia: No Altered Concentration: No Feels Worthless: No Grandiose Ideas: No Belief In Special Powers: No New/Increased Substance Abuse: No Compulsions: No  Neurologic: Headache: No Seizure: No Paresthesias: No  Medical History:  Patient has a history of high triglycerides, irritable bowel syndrome, chronic pain, GERD.    Psychosocial history.  Patient is born and raised in [place]. His been married once. He has 2 daughters. Patient lives with his wife. His daughter lives close by. He has good relationship with his daughter.   Past Psychiatric History/Hospitalization(s): Patient has at least 4-5 psychiatric hospitalization.  His last psychiatric  admission was in July 2014 .  He has a history of taking overdose on his medication and mixing with alcohol. He has a history of cutting his wrist. He is taking his psychiatric medication for past 8 years. In the past he has seen Sean Hill and Sean Hill. As per chart he has taken in the past Tegretol, Depakote, Ambien, Geodon, Abilify, lithium, Provigil, Zoloft, Neurontin , Lamictal, Wellbutrin , Risperdal and Prstiq.  He said he overdosed on Latuda. Patient do not recall very well the previous response of these medication. However he felt taking all these medications together was making him very drowsy groggy and unable to function. Patient endorsed history of mania and severe impulsive behavior. He admitted history of buying unnecessary, getting speeding tickets and aggression.  Patient reported history of side effects due to polypharmacy.  Recently he had tried Remeron and Vistaril for insomnia but did not help him. Anxiety: Yes Bipolar Disorder: Yes Depression: Yes Mania: Yes Psychosis: Yes Schizophrenia: No Personality Disorder: No Hospitalization for psychiatric illness: Yes History of Electroconvulsive Shock Therapy: No Prior Suicide Attempts: Yes  Physical Exam: Constitutional:  BP 145/66  Pulse 66  Ht 5\' 8"  (1.727 m)  Wt 262 lb 12.8 oz (119.205 kg)  BMI 39.97 kg/m2   General Appearance: well nourished and obese  Musculoskeletal: Strength & Muscle Tone: within normal limits Gait & Station: normal Patient leans: Patient has normal posture  Mental status examination Patient is casually dressed and fairly groomed.  He is cooperative and pleasant.  He maintained good eye contact.  He described his mood is better and his affect is improved from the past.  He denies any active or passive suicidal parts or homicidal thought.  There were no paranoia or delusions.  His psychomotor activity is normal.  His speech is slow but clear and coherent.  His fund of knowledge is good.  His  thought processes slow but logical and goal directed.  He denies any auditory or visual hallucination.  There were no delusions or any paranoia.  His attention and concentration is fair.  He is alert and oriented x3.  His memory is good.  His insight judgment and impulse control is okay.  Established Problem, Stable/Improving (1), Review of Psycho-Social Stressors (1), Review of Last Therapy Session (1) and Review of Medication Regimen & Side Effects (2)  Assessment: Axis I: Bipolar disorder NOS  Axis II: Deferred  Axis III:  Patient Active Problem List   Diagnosis Date Noted  . Drug overdose 04/23/2013  . Respiratory failure with hypoxia 04/23/2013  . Acute encephalopathy 04/23/2013  . Gastroesophageal reflux disease 11/28/2011  . Bipolar disorder 11/25/2011    Axis IV: Moderate  Axis V: 60-65   Plan:  Patient is doing better on his current psychotropic medication.  He does not have any side effects of medication.  I will continue Lexapro 10 mg daily, Cogentin 1 mg at bedtime, Abilify maintaina injection 400 mg every 4 weeks, temazepam 15 mg at bedtime, trazodone 100 mg at bedtime.  Patient still has refills remaining on his Klonopin.  Recommend her to see that as her coping and social skills.  I will see him again in 2 months.  Recommended to call us back if he is any question or any concern.   Secily Walthour T., MD 12/31/2013

## 2014-01-07 ENCOUNTER — Ambulatory Visit (INDEPENDENT_AMBULATORY_CARE_PROVIDER_SITE_OTHER): Payer: Medicare HMO | Admitting: Psychiatry

## 2014-01-07 DIAGNOSIS — F319 Bipolar disorder, unspecified: Secondary | ICD-10-CM

## 2014-01-07 NOTE — Progress Notes (Signed)
   THERAPIST PROGRESS NOTE  Session Time: 10:00-10:50 am   Participation Level: Active  Behavioral Response: CasualAlertelevated mood  Type of Therapy: Individual Therapy  Treatment Goals addressed: Bipolar Disorder and Too much unstructured time  Interventions: CBT and Supportive  Summary: Sean Hill is a 73 y.o. male who presents with elevated mood and affect for the fourth week in a row. Pt said he continues to feel good with no depression symptoms and rated his mood a 8/10 with (10 being high). Pt said he continues to get out of the house, spend time outside planting things and doing things with his wife. Pt said it feels good to feel good. Pt said his primary stressor is his continued shoulder pain and he has an appointment today with his doctor to explore options for pain relief, but is hoping to avoid surgery. Pt said it impacting his sleep and he also plans to address this with the doctor.    Suicidal/Homicidal: No  Therapist Response: Assessed overall level of depression per Pt self report, reviwed progress in treatment and causes for continued elevated mood, encouraged continued use of coping skills.   Plan: Return again in 1  weeks.  Diagnosis: Axis I: Bipolar Disorder      Tresa Res, LCSW 01/07/2014

## 2014-01-21 ENCOUNTER — Ambulatory Visit (INDEPENDENT_AMBULATORY_CARE_PROVIDER_SITE_OTHER): Payer: Medicare HMO | Admitting: Psychiatry

## 2014-01-21 DIAGNOSIS — F319 Bipolar disorder, unspecified: Secondary | ICD-10-CM

## 2014-01-22 ENCOUNTER — Ambulatory Visit (INDEPENDENT_AMBULATORY_CARE_PROVIDER_SITE_OTHER): Payer: Medicare HMO | Admitting: *Deleted

## 2014-01-22 ENCOUNTER — Other Ambulatory Visit (HOSPITAL_COMMUNITY): Payer: Self-pay | Admitting: Psychiatry

## 2014-01-22 VITALS — BP 119/69 | HR 68 | Ht 68.0 in | Wt 261.6 lb

## 2014-01-22 DIAGNOSIS — F319 Bipolar disorder, unspecified: Secondary | ICD-10-CM

## 2014-01-22 NOTE — Progress Notes (Signed)
Abilify Maintena 400 mg, IM  Right Upper Outer Quadrant  Lot # [id number] Sample from [facility]

## 2014-01-22 NOTE — Progress Notes (Signed)
   THERAPIST PROGRESS NOTE  Session Time: 4:00-4:50 pm  Participation Level: Active  Behavioral Response: Neat and Well GroomedAlertEuthymic  Type of Therapy: Individual Therapy  Treatment Goals addressed: Bipolar Disorder  Interventions: CBT and Supportive  Summary: KYHEEM BATHGATE is a 73 y.o. male who presents with euthymic mood and affect and reports still feeling very good and at his baseline level of functioning. Pt said he feels his medications are working perfectly to maintain his mood and is scheduled for his injection tomorrow. Pt said he continues to work outside the house planting things and is enjoying being more active outside of the home. Pts main stressor is relating to shoulder pain where he is awaiting the results of an MRI to determine the course of surgery that will be needed. Pt processed feelings about the upcoming surgery and said he would rather not have it, but knows it is necessary due to the amount of pain he is in. Pt said he is trying to lose weight and has restricted his calories. Pt agreed to consider adding in walking and exercise to help with weight loss and ensure mood stability.    Suicidal/Homicidal: No   Therapist Response: Assessed overall level of functioning per Pt self report, reviewed coping strategies maintaining wellness, encouraged more caloric intake to ensure mood stability and encouraged exercise.   Plan: Return again in 1 weeks.  Diagnosis: Axis I: Bipolar Disorder      Tresa Res, LCSW 01/22/2014

## 2014-02-04 ENCOUNTER — Ambulatory Visit (INDEPENDENT_AMBULATORY_CARE_PROVIDER_SITE_OTHER): Payer: Medicare HMO | Admitting: Psychiatry

## 2014-02-04 DIAGNOSIS — F319 Bipolar disorder, unspecified: Secondary | ICD-10-CM

## 2014-02-05 NOTE — Progress Notes (Signed)
   THERAPIST PROGRESS NOTE  Session Time: 4:00-4:50 am   Participation Level: Active  Behavioral Response: CasualAlertEuthymic  Type of Therapy: Individual Therapy  Treatment Goals addressed: Bipolar Disorder  Interventions: CBT, Strength-based and Supportive  Summary: Sean Hill is a 73 y.o. male who presents with euthymic mood and affect and continues to report feeling good. Pt has been feeling good for the past three months consistently and processed feelings of happiness associated with this longevity of mood stability. Pt said he continues to feel the medications are working and continues to try to explore things to do outside of the home that he enjoys and brings him a sense of purpose. Pt has meeting tomorrow with his shoulder surgery doctor to discuss surgery options. Pt said he would like to just do it and get it over with even if it means having surgery while his wife is out of town this next week. Pt explored his tendency towards impulsivity and how this has caused increase stress in his life in the past and he was open to exploring that with insight. Pt processed feelings associated with writer going on maternity leave and said he feels the most stable he has in a long time and agreed to contact [facility] if he needs additional support while Probation officer is on leave.    Suicidal/Homicidal: No  Therapist Response: Assessed overall level of functioning, reviewed longevity with mood stability and coping skills working to maintain it. Challenged impulsive thought processes's and explored consequences to impulsive behaviors, revived maternity leave plan.   Plan: Return again when writer returns from maternity leave Diagnosis: Axis I: Bipolar Disorder        Tresa Res, LCSW 02/05/2014

## 2014-02-20 ENCOUNTER — Ambulatory Visit (INDEPENDENT_AMBULATORY_CARE_PROVIDER_SITE_OTHER): Payer: Medicare HMO | Admitting: *Deleted

## 2014-02-20 VITALS — BP 136/79 | HR 59 | Ht 68.0 in | Wt 261.0 lb

## 2014-02-20 DIAGNOSIS — F319 Bipolar disorder, unspecified: Secondary | ICD-10-CM

## 2014-02-20 NOTE — Progress Notes (Signed)
Abilify Maintena 400 mg, IM  Left Upper Outer Quadrant  Lot # [id number]  Sample from [facility]

## 2014-03-03 ENCOUNTER — Ambulatory Visit (HOSPITAL_COMMUNITY): Payer: Self-pay | Admitting: Psychiatry

## 2014-03-11 ENCOUNTER — Ambulatory Visit (INDEPENDENT_AMBULATORY_CARE_PROVIDER_SITE_OTHER): Payer: Medicare HMO | Admitting: Psychiatry

## 2014-03-11 ENCOUNTER — Encounter (HOSPITAL_COMMUNITY): Payer: Self-pay | Admitting: Psychiatry

## 2014-03-11 VITALS — BP 128/80 | HR 73 | Ht 68.0 in | Wt 262.6 lb

## 2014-03-11 DIAGNOSIS — G4700 Insomnia, unspecified: Secondary | ICD-10-CM

## 2014-03-11 DIAGNOSIS — F3177 Bipolar disorder, in partial remission, most recent episode mixed: Secondary | ICD-10-CM

## 2014-03-11 DIAGNOSIS — F319 Bipolar disorder, unspecified: Secondary | ICD-10-CM

## 2014-03-11 MED ORDER — TRAZODONE HCL 100 MG PO TABS: 100.0000 mg | ORAL_TABLET | Freq: Every day | ORAL | Status: DC

## 2014-03-11 MED ORDER — TEMAZEPAM 15 MG PO CAPS: 15.0000 mg | ORAL_CAPSULE | Freq: Every evening | ORAL | Status: DC | PRN

## 2014-03-11 MED ORDER — BENZTROPINE MESYLATE 1 MG PO TABS: 1.0000 mg | ORAL_TABLET | Freq: Every day | ORAL | Status: DC

## 2014-03-11 MED ORDER — ESCITALOPRAM OXALATE 10 MG PO TABS: ORAL_TABLET | ORAL | Status: DC

## 2014-03-11 NOTE — Progress Notes (Signed)
Sean Hill [phone] Progress Note  Sean Hill [id number] 73 y.o.  03/11/2014 4:39 PM  Chief Complaint:  Medication management and followup.              History of Present Illness: Sean Hill came for his followup appointment.  He is compliant with his psychotropic medication.  He likes the Lexapro .  He is feeling less depressed and less anxious.  He is complaining of shoulder pain and he may require shoulder surgery in October.  He is not taking Klonopin .  He likes temazepam and trazodone but is helping his sleep.  He continues to have dry mouth which he believes because of the Abilify but he does not want to change his medication because it is helping his mood swings anger and irritability.  He endorsed that his wife also noticed improvement in his mood and behavior.  He is sleeping good.  He denies any crying spells, feeling of hopelessness or worthlessness.  He has no tremors or shakes.  His getting Abilify injection 400 mg every 4 weeks .  He is not drinking or using any legal substances.  He has no paranoia or any delusions.  His vitals are stable.  He has not seen [name] because she is on maternity leave.  He does not want to see a new therapist and he will wait when she comes from maternity leave.  Suicidal Ideation: No Plan Formed: No Patient has means to carry out plan: No  Homicidal Ideation: No Plan Formed: No Patient has means to carry out plan: No  Review of Systems  HENT:       Dry mouth    Psychiatric: Agitation: No Hallucination: No Depressed Mood: No Insomnia: No Hypersomnia: No Altered Concentration: No Feels Worthless: No Grandiose Ideas: No Belief In Special Powers: No New/Increased Substance Abuse: No Compulsions: No  Neurologic: Headache: No Seizure: No Paresthesias: No  Medical History:  Patient has a history of high triglycerides, irritable bowel syndrome, chronic pain, GERD.  His primary care physician is Dr. Shawna Orleans.  Psychosocial  history.  Patient is born and raised in [place]. His been married once. He has 2 daughters. Patient lives with his wife. His daughter lives close by. He has good relationship with his daughter.  Past Psychiatric History/Hospitalization(s): Patient has at least 4-5 psychiatric hospitalization.  His last psychiatric admission was in July 2014 .  He has a history of taking overdose on his medication and mixing with alcohol. He has a history of cutting his wrist.  He has seen in the past Jimmye Norman and [employer].  He has taken Tegretol, Depakote, Ambien, Remeron, Vistaril , Geodon, Abilify, lithium, Provigil, Zoloft, Neurontin , Lamictal, Wellbutrin , Risperdal and Prstiq.  He had overdose on Latuda. Patient endorsed history of mania and severe impulsive behavior. He admitted history of buying unnecessary, getting speeding tickets and aggression.  Patient reported history of side effects due to polypharmacy.   Anxiety: Yes Bipolar Disorder: Yes Depression: Yes Mania: Yes Psychosis: Yes Schizophrenia: No Personality Disorder: No Hospitalization for psychiatric illness: Yes History of Electroconvulsive Shock Therapy: No Prior Suicide Attempts: Yes  Physical Exam: Constitutional:  BP 128/80  Pulse 73  Ht 5\' 8"  (1.727 m)  Wt 262 lb 9.6 oz (119.115 kg)  BMI 39.94 kg/m2   General Appearance: well nourished and obese  Musculoskeletal: Strength & Muscle Tone: within normal limits Gait & Station: normal Patient leans: Patient has normal posture  Mental status examination Patient is casually  dressed and fairly groomed.  He is cooperative and pleasant.  He maintained good eye contact.  He described his mood is euthymic and his affect is mood appropriate.  He denies any active or passive suicidal parts or homicidal thought.  There were no paranoia or delusions.  His psychomotor activity is normal.  His speech is slow but clear and coherent.  His fund of knowledge is good.  His thought  processes slow but logical and goal directed.  He denies any auditory or visual hallucination.  There were no delusions or any paranoia.  His attention and concentration is fair.  He is alert and oriented x3.  His memory is good.  His insight judgment and impulse control is okay.  Established Problem, Stable/Improving (1), Review of Psycho-Social Stressors (1), Review of Last Therapy Session (1) and Review of Medication Regimen & Side Effects (2)  Assessment: Axis I: Bipolar disorder NOS  Axis II: Deferred  Axis III:  Patient Active Problem List   Diagnosis Date Noted  . Drug overdose 04/23/2013  . Respiratory failure with hypoxia 04/23/2013  . Acute encephalopathy 04/23/2013  . Gastroesophageal reflux disease 11/28/2011  . Bipolar disorder 11/25/2011    Axis IV: Moderate  Axis V: 60-65   Plan:  Patient is doing better on his current psychotropic medication.  He does not have any side effects of medication.  I will continue Lexapro 10 mg daily, Cogentin 1 mg at bedtime, Abilify maintaina injection 400 mg every 4 weeks, temazepam 15 mg at bedtime, trazodone 100 mg at bedtime.  I will discontinue Klonopin since patient is not taking any more.  He will resume counseling when his therapist comes from maternity leave .  Recommended to call us back if he has any question or any concern.  Followup in 3 months.  Joely Losier T., MD 03/11/2014

## 2014-03-24 ENCOUNTER — Ambulatory Visit (HOSPITAL_COMMUNITY): Payer: Self-pay | Admitting: *Deleted

## 2014-03-25 ENCOUNTER — Ambulatory Visit (INDEPENDENT_AMBULATORY_CARE_PROVIDER_SITE_OTHER): Payer: Medicare HMO | Admitting: *Deleted

## 2014-03-25 VITALS — BP 148/77 | HR 76 | Ht 68.0 in | Wt 260.0 lb

## 2014-03-25 DIAGNOSIS — F319 Bipolar disorder, unspecified: Secondary | ICD-10-CM

## 2014-03-25 NOTE — Progress Notes (Signed)
Abilify Maintena 400 mg, IM  Right Upper Outer Quadrant  Lot # [id number] Sample from [place]

## 2014-04-22 ENCOUNTER — Ambulatory Visit (INDEPENDENT_AMBULATORY_CARE_PROVIDER_SITE_OTHER): Payer: Medicare HMO | Admitting: *Deleted

## 2014-04-22 VITALS — BP 144/81 | HR 72 | Ht 68.0 in | Wt 260.6 lb

## 2014-04-22 DIAGNOSIS — F319 Bipolar disorder, unspecified: Secondary | ICD-10-CM

## 2014-04-22 NOTE — Progress Notes (Signed)
Abilify Maintena 400 mg, IM   Left Upper Outer Quadrant   Lot # [id number] Sample from [place]

## 2014-05-20 ENCOUNTER — Ambulatory Visit (INDEPENDENT_AMBULATORY_CARE_PROVIDER_SITE_OTHER): Payer: Medicare HMO | Admitting: *Deleted

## 2014-05-20 VITALS — BP 133/76 | HR 73 | Wt 262.0 lb

## 2014-05-20 DIAGNOSIS — F319 Bipolar disorder, unspecified: Secondary | ICD-10-CM

## 2014-05-20 NOTE — Progress Notes (Signed)
Abilify Maintena 400mg  injection given in right upper outer quadrant. No complaints or issues, patient states the medication is working good for him.. Lot #[id number], supplied by [facility] pharmacy

## 2014-05-21 ENCOUNTER — Other Ambulatory Visit (HOSPITAL_COMMUNITY): Payer: Self-pay | Admitting: Psychiatry

## 2014-05-22 ENCOUNTER — Other Ambulatory Visit (HOSPITAL_COMMUNITY): Payer: Self-pay | Admitting: Psychiatry

## 2014-05-22 DIAGNOSIS — F3177 Bipolar disorder, in partial remission, most recent episode mixed: Secondary | ICD-10-CM

## 2014-05-22 MED ORDER — BENZTROPINE MESYLATE 1 MG PO TABS: 1.0000 mg | ORAL_TABLET | Freq: Every day | ORAL | Status: DC

## 2014-06-02 ENCOUNTER — Telehealth: Payer: Self-pay | Admitting: Internal Medicine

## 2014-06-02 NOTE — Telephone Encounter (Signed)
[id number] - [place], [place] - [address] is requesting re-fill on omeprazole (PRILOSEC) 40 MG capsule

## 2014-06-02 NOTE — Telephone Encounter (Signed)
Pt needs an OV  

## 2014-06-02 NOTE — Telephone Encounter (Signed)
LMOM for pt to call office and schedule OV. °

## 2014-06-11 ENCOUNTER — Ambulatory Visit (HOSPITAL_COMMUNITY): Payer: Self-pay | Admitting: Psychiatry

## 2014-06-18 ENCOUNTER — Other Ambulatory Visit: Payer: Self-pay | Admitting: Internal Medicine

## 2014-06-20 ENCOUNTER — Ambulatory Visit (INDEPENDENT_AMBULATORY_CARE_PROVIDER_SITE_OTHER): Payer: Medicare HMO | Admitting: Psychiatry

## 2014-06-20 ENCOUNTER — Ambulatory Visit (INDEPENDENT_AMBULATORY_CARE_PROVIDER_SITE_OTHER): Payer: Medicare HMO | Admitting: *Deleted

## 2014-06-20 ENCOUNTER — Encounter (HOSPITAL_COMMUNITY): Payer: Self-pay | Admitting: Psychiatry

## 2014-06-20 VITALS — BP 124/73 | HR 72 | Wt 261.2 lb

## 2014-06-20 DIAGNOSIS — F3177 Bipolar disorder, in partial remission, most recent episode mixed: Secondary | ICD-10-CM

## 2014-06-20 DIAGNOSIS — F319 Bipolar disorder, unspecified: Secondary | ICD-10-CM

## 2014-06-20 MED ORDER — BENZTROPINE MESYLATE 1 MG PO TABS: 1.0000 mg | ORAL_TABLET | Freq: Every day | ORAL | Status: DC

## 2014-06-20 MED ORDER — TRAZODONE HCL 100 MG PO TABS: 100.0000 mg | ORAL_TABLET | Freq: Every day | ORAL | Status: DC

## 2014-06-20 MED ORDER — ESCITALOPRAM OXALATE 20 MG PO TABS: ORAL_TABLET | ORAL | Status: DC

## 2014-06-20 MED ORDER — TEMAZEPAM 15 MG PO CAPS: 15.0000 mg | ORAL_CAPSULE | Freq: Every evening | ORAL | Status: DC | PRN

## 2014-06-20 NOTE — Progress Notes (Signed)
Abilify Maintena 400mg  given in Left Upper Outer Quadrant. Pt states he has been doing great. No issues or complaints noted. Lot #[id number] supplied by [facility].

## 2014-06-20 NOTE — Progress Notes (Signed)
[facility] [phone] Progress Note  Sean Hill [id number] 73 y.o.  06/20/2014 12:02 PM  Chief Complaint:  Medication management and followup.              History of Present Illness: Sean Hill came for his followup appointment.  He is compliant with his psychotropic medication.  Sometime he feels anxious and nervous.  However his depression is under control.  He likes Abilify injection.  He denies any tremors or shakes.  He is seeing [name] for counseling.  He is sleeping better.  He denies any crying spells or any feeling of hopelessness.  His appetite is okay.  His lungs are stable.  He lives with his wife who is very supportive. He is not drinking or using any legal substances.  He has no paranoia or any delusions.  His vitals are stable.    Suicidal Ideation: No Plan Formed: No Patient has means to carry out plan: No  Homicidal Ideation: No Plan Formed: No Patient has means to carry out plan: No  Review of Systems  Constitutional: Negative.   HENT:       Dry mouth  Skin: Negative.   Neurological: Negative.   Psychiatric/Behavioral: The patient is nervous/anxious.        Anxiety    Psychiatric: Agitation: No Hallucination: No Depressed Mood: No Insomnia: No Hypersomnia: No Altered Concentration: No Feels Worthless: No Grandiose Ideas: No Belief In Special Powers: No New/Increased Substance Abuse: No Compulsions: No  Neurologic: Headache: No Seizure: No Paresthesias: No  Medical History:  Patient has a history of high triglycerides, irritable bowel syndrome, chronic pain, GERD.  His primary care physician is Dr. Shawna Orleans.  Psychosocial history.  Patient is born and raised in [place]. His been married once. He has 2 daughters. Patient lives with his wife. His daughter lives close by. He has good relationship with his daughter.  Past Psychiatric History/Hospitalization(s): Patient has at least 4-5 psychiatric hospitalization.  His last  psychiatric admission was in July 2014 .  He has a history of taking overdose on his medication and mixing with alcohol. He has a history of cutting his wrist.  He has taken Tegretol, Depakote, Ambien, Remeron, Vistaril , Geodon, Abilify, lithium, Provigil, Zoloft, Neurontin , Lamictal, Wellbutrin , Risperdal and Prstiq.  He had overdose on Latuda. Patient endorsed history of mania and severe impulsive behavior. He admitted history of buying unnecessary, getting speeding tickets and aggression.  Patient reported history of side effects due to polypharmacy.   Anxiety: Yes Bipolar Disorder: Yes Depression: Yes Mania: Yes Psychosis: Yes Schizophrenia: No Personality Disorder: No Hospitalization for psychiatric illness: Yes History of Electroconvulsive Shock Therapy: No Prior Suicide Attempts: Yes  Physical Exam: Constitutional:  BP 124/73  Pulse 72  Wt 261 lb 3.2 oz (118.48 kg)   General Appearance: well nourished and obese  Musculoskeletal: Strength & Muscle Tone: within normal limits Gait & Station: normal Patient leans: Patient has normal posture  Mental status examination Patient is casually dressed and fairly groomed.  He is anxious but cooperative.  He maintained good eye contact.  He described his mood euthymic and his affect is mood appropriate.  He denies any active or passive suicidal parts or homicidal thought.  There were no paranoia or delusions.  His psychomotor activity is normal.  His speech is slow but clear and coherent.  His fund of knowledge is good.  His thought processes slow but logical and goal directed.  He denies any auditory or visual hallucination.  There were no delusions or any paranoia.  His attention and concentration is fair.  He is alert and oriented x3.  His memory is good.  His insight judgment and impulse control is okay.  Established Problem, Stable/Improving (1), Review of Psycho-Social Stressors (1), Established Problem, Worsening (2), Review of Last  Therapy Session (1), Review of Medication Regimen & Side Effects (2) and Review of New Medication or Change in Dosage (2)  Assessment: Axis I: Bipolar disorder NOS  Axis II: Deferred  Axis III:  Patient Active Problem List   Diagnosis Date Noted  . Drug overdose 04/23/2013  . Respiratory failure with hypoxia 04/23/2013  . Acute encephalopathy 04/23/2013  . Gastroesophageal reflux disease 11/28/2011  . Bipolar disorder 11/25/2011    Axis IV: Moderate  Axis V: 60-65   Plan:  I recommended to increase Lexapro 20 mg daily .  Continue Abilify injection 400 mg every [redacted] weeks along with temazepam 15 mg at bedtime, trazodone 100 mg at bedtime .  Patient started seeing [name] for counseling , encouraged to keep appointment in the bases.  Recommended to call us back if he has any questions or if he feels worsening of her symptoms.  I will see him again in 3 months. Time spent 25 minutes.  More than 50% of the time spent in psychoeducation, counseling and coordination of care.  Discuss safety plan that anytime having active suicidal thoughts or homicidal thoughts then patient need to call 911 or go to the local emergency room.   ARFEEN,SYED T., MD 06/20/2014

## 2014-06-23 ENCOUNTER — Ambulatory Visit (HOSPITAL_COMMUNITY): Payer: Self-pay | Admitting: *Deleted

## 2014-07-19 ENCOUNTER — Other Ambulatory Visit (HOSPITAL_COMMUNITY): Payer: Self-pay | Admitting: Psychiatry

## 2014-07-24 ENCOUNTER — Ambulatory Visit (INDEPENDENT_AMBULATORY_CARE_PROVIDER_SITE_OTHER): Payer: Medicare HMO | Admitting: *Deleted

## 2014-07-24 VITALS — BP 124/74 | HR 68 | Ht 69.5 in | Wt 257.2 lb

## 2014-07-24 DIAGNOSIS — F3177 Bipolar disorder, in partial remission, most recent episode mixed: Secondary | ICD-10-CM

## 2014-07-24 NOTE — Progress Notes (Signed)
Abilify Maintena 400mg  given IM Right Upper Outer Quadrant.  No issues or complaints noted with previous injection  Lot #[id number] supplied by [facility].

## 2014-08-20 ENCOUNTER — Other Ambulatory Visit (HOSPITAL_COMMUNITY): Payer: Self-pay | Admitting: Psychiatry

## 2014-08-25 ENCOUNTER — Ambulatory Visit (HOSPITAL_COMMUNITY): Payer: Self-pay | Admitting: *Deleted

## 2014-08-26 ENCOUNTER — Ambulatory Visit (HOSPITAL_COMMUNITY): Payer: Self-pay | Admitting: *Deleted

## 2014-08-26 ENCOUNTER — Other Ambulatory Visit (HOSPITAL_COMMUNITY): Payer: Self-pay | Admitting: Psychiatry

## 2014-08-26 ENCOUNTER — Ambulatory Visit (INDEPENDENT_AMBULATORY_CARE_PROVIDER_SITE_OTHER): Payer: Medicare HMO | Admitting: *Deleted

## 2014-08-26 VITALS — BP 138/81 | HR 67 | Resp 18 | Ht 70.0 in | Wt 255.6 lb

## 2014-08-26 DIAGNOSIS — F3177 Bipolar disorder, in partial remission, most recent episode mixed: Secondary | ICD-10-CM

## 2014-08-26 DIAGNOSIS — F3131 Bipolar disorder, current episode depressed, mild: Secondary | ICD-10-CM

## 2014-08-26 MED ORDER — ARIPIPRAZOLE ER 400 MG IM SUSR: 400.0000 mg | INTRAMUSCULAR | Status: DC

## 2014-08-26 NOTE — Progress Notes (Signed)
D. Patient presented with appropriate affect, level mood and stated he had been doing well for over 1 year.  Patient with no suicidal or homicidal ideations, no auditory or visual hallucinations and no other concerns noted.  Patient stated Abilify Maintena working great.  A.  Injection prepared as ordered.  Lot #[id number], expires 05/2016.  Pharmacy supplied sample.  Injection given in R gluteal area and tolerated without complaint.  R. Patient denied any pain or problems with injection.  Agreed to return in 1 month for next injection.  No concerns noted this date.

## 2014-09-18 ENCOUNTER — Other Ambulatory Visit (HOSPITAL_COMMUNITY): Payer: Self-pay | Admitting: Psychiatry

## 2014-09-19 NOTE — Telephone Encounter (Signed)
Chart reviewed. Refills appropriate. Called in Restoril refill. Pt did not have enough until next appointment.

## 2014-09-22 ENCOUNTER — Ambulatory Visit (HOSPITAL_COMMUNITY): Payer: Self-pay | Admitting: Psychiatry

## 2014-09-24 ENCOUNTER — Ambulatory Visit (INDEPENDENT_AMBULATORY_CARE_PROVIDER_SITE_OTHER): Payer: Medicare HMO | Admitting: *Deleted

## 2014-09-24 VITALS — BP 138/81 | HR 70 | Ht 70.0 in | Wt 256.6 lb

## 2014-09-24 DIAGNOSIS — F3177 Bipolar disorder, in partial remission, most recent episode mixed: Secondary | ICD-10-CM

## 2014-09-25 ENCOUNTER — Ambulatory Visit (INDEPENDENT_AMBULATORY_CARE_PROVIDER_SITE_OTHER): Payer: Medicare HMO | Admitting: Psychiatry

## 2014-09-25 ENCOUNTER — Encounter (HOSPITAL_COMMUNITY): Payer: Self-pay | Admitting: Psychiatry

## 2014-09-25 VITALS — BP 142/64 | HR 96 | Ht 70.0 in | Wt 256.8 lb

## 2014-09-25 DIAGNOSIS — F319 Bipolar disorder, unspecified: Secondary | ICD-10-CM

## 2014-09-25 MED ORDER — TEMAZEPAM 15 MG PO CAPS: 15.0000 mg | ORAL_CAPSULE | Freq: Every evening | ORAL | Status: DC | PRN

## 2014-09-25 MED ORDER — TRAZODONE HCL 100 MG PO TABS: 100.0000 mg | ORAL_TABLET | Freq: Every day | ORAL | Status: DC

## 2014-09-25 MED ORDER — ESCITALOPRAM OXALATE 20 MG PO TABS: 20.0000 mg | ORAL_TABLET | Freq: Every day | ORAL | Status: DC

## 2014-09-25 MED ORDER — BENZTROPINE MESYLATE 1 MG PO TABS: 1.0000 mg | ORAL_TABLET | Freq: Every day | ORAL | Status: DC

## 2014-09-25 NOTE — Progress Notes (Addendum)
[facility] [phone] Progress Note  Sean Hill [id number] 74 y.o.  09/25/2014 4:30 PM  Chief Complaint:  Medication management and followup.              History of Present Illness: Sean Hill came for his followup appointment.  On his last visit we increased Lexapro 20 mg.  He is doing much better.  He is less anxious and less depressed.  He sleeping good.  He is seeing [name] for counseling.  He has no tremors or shakes.  Sometime he is complaining of dry mouth but there has been no other concern.  His irritability, anger and rage has been much improved.  His appetite is okay.  His vitals are stable.  Patient lives with his wife who is very supportive.  Patient denies drinking or using any illegal substances.  Suicidal Ideation: No Plan Formed: No Patient has means to carry out plan: No  Homicidal Ideation: No Plan Formed: No Patient has means to carry out plan: No  Review of Systems  Constitutional: Negative.   HENT:       Dry mouth  Skin: Negative.   Neurological: Negative.     Psychiatric: Agitation: No Hallucination: No Depressed Mood: No Insomnia: No Hypersomnia: No Altered Concentration: No Feels Worthless: No Grandiose Ideas: No Belief In Special Powers: No New/Increased Substance Abuse: No Compulsions: No  Neurologic: Headache: No Seizure: No Paresthesias: No  Medical History:  Patient has a history of high triglycerides, irritable bowel syndrome, chronic pain, GERD.  His primary care physician is Dr. Shawna Orleans.  Psychosocial history.  Patient is born and raised in [place]. His been married once. He has 2 daughters. Patient lives with his wife. His daughter lives close by. He has good relationship with his daughter.  Past Psychiatric History/Hospitalization(s): Patient has at least 4-5 psychiatric hospitalization.  His last psychiatric admission was in July 2014 .  He has a history of taking overdose on his medication and mixing with  alcohol. He has a history of cutting his wrist.  He has taken Tegretol, Depakote, Ambien, Remeron, Vistaril , Geodon, Abilify, lithium, Provigil, Zoloft, Neurontin , Lamictal, Wellbutrin , Risperdal and Prstiq.  He had overdose on Latuda. Patient endorsed history of mania and severe impulsive behavior. He admitted history of buying unnecessary, getting speeding tickets and aggression.  Patient reported history of side effects due to polypharmacy.   Anxiety: Yes Bipolar Disorder: Yes Depression: Yes Mania: Yes Psychosis: Yes Schizophrenia: No Personality Disorder: No Hospitalization for psychiatric illness: Yes History of Electroconvulsive Shock Therapy: No Prior Suicide Attempts: Yes  Physical Exam: Constitutional:  BP 142/64 mmHg  Pulse 96  Ht 5\' 10"  (1.778 m)  Wt 256 lb 12.8 oz (116.484 kg)  BMI 36.85 kg/m2   General Appearance: well nourished and obese  Musculoskeletal: Strength & Muscle Tone: within normal limits Gait & Station: normal Patient leans: Patient has normal posture  Mental status examination Patient is casually dressed and fairly groomed.  He is pleasant and cooperative.  He maintained good eye contact.  He described his mood good and his affect is okay. He denies any active or passive suicidal parts or homicidal thought.  There were no paranoia or delusions.  His psychomotor activity is normal.  His speech is slow but clear and coherent.  His fund of knowledge is good.  His thought process is slow but logical and goal directed.  He denies any auditory or visual hallucination.  There were no delusions or any paranoia.  His attention and concentration is fair.  He is alert and oriented x3.  His memory is good.  His insight judgment and impulse control is okay.  Established Problem, Stable/Improving (1), Review of Psycho-Social Stressors (1), Review or order clinical lab tests (1), Review of Last Therapy Session (1) and Review of Medication Regimen & Side Effects  (2)  Assessment: Axis I: Bipolar disorder NOS  Axis II: Deferred  Axis III:  Patient Active Problem List   Diagnosis Date Noted  . Drug overdose 04/23/2013  . Respiratory failure with hypoxia 04/23/2013  . Acute encephalopathy 04/23/2013  . Gastroesophageal reflux disease 11/28/2011  . Bipolar disorder 11/25/2011    Axis IV: Moderate  Axis V: 60-65   Plan:  Patient is doing better on his current medication.  I will continue Abilify injection 400 mg IM every 4 weeks, Cogentin 1 mg at bedtime, temazepam 15 mg at bedtime, trazodone 100 mg at bedtime and Lexapro 20 mg daily.  Patient has not seen his primary care physician in a while .  We will do blood work including CBC, CMP, hemoglobin A1c and TSH.  Encouraged to keep appointment with [facility] for therapy.  Recommended to call us back if he has any question or any concern.  Follow-up in 3 months.  Kaliope Quinonez T., MD 09/25/2014

## 2014-09-26 ENCOUNTER — Other Ambulatory Visit (HOSPITAL_COMMUNITY): Payer: Self-pay | Admitting: *Deleted

## 2014-09-26 NOTE — Progress Notes (Signed)
09/26/14-Charting injection that was given by Debarah Crape, RN on 09/24/14 in Left Upper Gluteus Maximus. Tolerated well with no complaints. Given at 10am. Debarah Crape, RN was unable to chart in Epic.

## 2014-10-23 ENCOUNTER — Ambulatory Visit (INDEPENDENT_AMBULATORY_CARE_PROVIDER_SITE_OTHER): Payer: Medicare HMO | Admitting: Psychiatry

## 2014-10-23 VITALS — BP 130/79 | HR 79

## 2014-10-23 DIAGNOSIS — F319 Bipolar disorder, unspecified: Secondary | ICD-10-CM

## 2014-10-23 MED ORDER — ARIPIPRAZOLE ER 400 MG IM SUSR
400.0000 mg | INTRAMUSCULAR | Status: DC
  Administered 2014-10-23: 400 mg via INTRAMUSCULAR

## 2014-10-23 NOTE — Progress Notes (Addendum)
Patient presented with appropriate affect, level mood but admitted he has been having increased symptoms of depression over the past month.  Patient states Winter months are always a little more difficult for him but denies any auditory or visual hallucinations.  Denies any suicidal or homicidal ideations and reports taking medication daily as prescribed.  A. Patient's due Abilify Maintena prepared and given to patient in his right upper outer gluteal quadrant.  R. Patient tolerated injection without complaint of pain or concerns.  Patient reported he would contact this nurse or practice back if symptoms of depression continued to increase as denies need for evaluation today.  Patient reports sleeping without difficulties and again no SI/HI currently. Patient states ready for improvement in the weather. Will see again in 30 days for next injection.  Patient's medication was a Sample medication and supplied by facility.

## 2014-11-24 ENCOUNTER — Encounter (HOSPITAL_COMMUNITY): Payer: Self-pay | Admitting: Psychiatry

## 2014-11-24 ENCOUNTER — Ambulatory Visit (INDEPENDENT_AMBULATORY_CARE_PROVIDER_SITE_OTHER): Payer: Medicare HMO | Admitting: Psychiatry

## 2014-11-24 VITALS — BP 148/80 | HR 67 | Ht 70.0 in | Wt 254.6 lb

## 2014-11-24 DIAGNOSIS — F3131 Bipolar disorder, current episode depressed, mild: Secondary | ICD-10-CM

## 2014-11-24 DIAGNOSIS — F319 Bipolar disorder, unspecified: Secondary | ICD-10-CM

## 2014-11-24 MED ORDER — ARIPIPRAZOLE ER 400 MG IM SUSR: 400.0000 mg | INTRAMUSCULAR | Status: DC

## 2014-11-24 MED ORDER — ARIPIPRAZOLE ER 400 MG IM SUSR
400.0000 mg | INTRAMUSCULAR | Status: DC
  Administered 2014-11-24: 400 mg via INTRAMUSCULAR

## 2014-11-24 NOTE — Patient Instructions (Signed)
Return in 30 days.

## 2014-11-24 NOTE — Progress Notes (Signed)
D. Patient presented with appropriate affect, level mood and reported no current symptoms.  Patient denied any auditory or visual hallucinations and stated not sleeping as well lately due to getting up at night periodically with a dry mouth.  Patient denied any suicidal or homicidal thoughts and reported being glad spring is coming soon and improved weather.  Patient reported his blood pressure has been up a little lately and is planning to follow up with his primary care provider.  A. Patient's Abilify Maintena 400mg  injection prepared as ordered and given to patient in his left upper outer gluteal area. Lot # [id number] with Expiration date of September 2017 sample. R. Patient tolerated injection without complaint of pain or discomfort and stated he really felt the "abilify is helping my mood" and keeping more level.  Patient agreed to return in 30 days for his next due injection and will see Dr. Adele Schilder that date for next scheduled evaluation.

## 2014-12-04 LAB — CBC WITH DIFFERENTIAL/PLATELET
Basophils Absolute: 0.1 10*3/uL (ref 0.0–0.1)
Basophils Relative: 1 % (ref 0–1)
EOS PCT: 4 % (ref 0–5)
Eosinophils Absolute: 0.3 10*3/uL (ref 0.0–0.7)
HCT: 47.2 % (ref 39.0–52.0)
Hemoglobin: 16.2 g/dL (ref 13.0–17.0)
LYMPHS PCT: 39 % (ref 12–46)
Lymphs Abs: 2.5 10*3/uL (ref 0.7–4.0)
MCH: 29.7 pg (ref 26.0–34.0)
MCHC: 34.3 g/dL (ref 30.0–36.0)
MCV: 86.6 fL (ref 78.0–100.0)
MPV: 10.8 fL (ref 8.6–12.4)
Monocytes Absolute: 0.4 10*3/uL (ref 0.1–1.0)
Monocytes Relative: 6 % (ref 3–12)
NEUTROS PCT: 50 % (ref 43–77)
Neutro Abs: 3.3 10*3/uL (ref 1.7–7.7)
Platelets: 259 10*3/uL (ref 150–400)
RBC: 5.45 MIL/uL (ref 4.22–5.81)
RDW: 14.8 % (ref 11.5–15.5)
WBC: 6.5 10*3/uL (ref 4.0–10.5)

## 2014-12-04 LAB — COMPLETE METABOLIC PANEL WITH GFR
ALBUMIN: 3.9 g/dL (ref 3.5–5.2)
ALK PHOS: 72 U/L (ref 39–117)
ALT: 26 U/L (ref 0–53)
AST: 27 U/L (ref 0–37)
BUN: 9 mg/dL (ref 6–23)
CO2: 27 meq/L (ref 19–32)
Calcium: 9.7 mg/dL (ref 8.4–10.5)
Chloride: 105 mEq/L (ref 96–112)
Creat: 0.88 mg/dL (ref 0.50–1.35)
GFR, Est Non African American: 86 mL/min
GLUCOSE: 109 mg/dL — AB (ref 70–99)
Potassium: 4.3 mEq/L (ref 3.5–5.3)
Sodium: 139 mEq/L (ref 135–145)
Total Bilirubin: 0.6 mg/dL (ref 0.2–1.2)
Total Protein: 6.7 g/dL (ref 6.0–8.3)

## 2014-12-04 LAB — TSH: TSH: 2.188 u[IU]/mL (ref 0.350–4.500)

## 2014-12-04 LAB — HEMOGLOBIN A1C
Hgb A1c MFr Bld: 5.9 % — ABNORMAL HIGH (ref <5.7)
Mean Plasma Glucose: 123 mg/dL — ABNORMAL HIGH (ref <117)

## 2014-12-21 ENCOUNTER — Other Ambulatory Visit (HOSPITAL_COMMUNITY): Payer: Self-pay | Admitting: Psychiatry

## 2014-12-21 NOTE — Telephone Encounter (Signed)
Refill request for Cogentin declined as was originally written 09/25/14 plus 2 refills and patient returns for evaluation on 12/25/14.

## 2014-12-23 ENCOUNTER — Ambulatory Visit (INDEPENDENT_AMBULATORY_CARE_PROVIDER_SITE_OTHER): Payer: Medicare HMO | Admitting: Psychiatry

## 2014-12-23 VITALS — BP 132/75 | HR 71 | Ht 70.0 in | Wt 255.4 lb

## 2014-12-23 DIAGNOSIS — F3131 Bipolar disorder, current episode depressed, mild: Secondary | ICD-10-CM | POA: Diagnosis not present

## 2014-12-23 MED ORDER — ARIPIPRAZOLE ER 400 MG IM SUSR: 400.0000 mg | INTRAMUSCULAR | Status: DC

## 2014-12-23 MED ORDER — ARIPIPRAZOLE ER 400 MG IM SUSR
400.0000 mg | INTRAMUSCULAR | Status: DC
  Administered 2014-12-23: 400 mg via INTRAMUSCULAR

## 2014-12-23 NOTE — Progress Notes (Signed)
D. Patient presented with appropriate affect, level mood and stated was here for requested and scheduled Abilify Maintena 400mg  IM injection today as would like to have when available.  New order entered per protocol and reminded patient of need to see MD later this week.  Patient denied any auditory or visual hallucinations, no suicidal or homicidal ideations and stated "the Abilify really works for me".  A. Patient's Abilify Maintena 400mg  IM injection prepared as ordered and given to patient in his upper right outer gluteal quadrant.  R. Patient tolerated injection without complaint of any pain or discomfort and stated he would return to see Dr. Adele Schilder this week for evaluation.  Patient reported plan to return in 30 days for next injection and stated sleeping and eating fine with no current symptoms or problems.  Patient to call as needed.  Return in 30 days.

## 2014-12-25 ENCOUNTER — Encounter (HOSPITAL_COMMUNITY): Payer: Self-pay | Admitting: Psychiatry

## 2014-12-25 ENCOUNTER — Ambulatory Visit (INDEPENDENT_AMBULATORY_CARE_PROVIDER_SITE_OTHER): Payer: Medicare HMO | Admitting: Psychiatry

## 2014-12-25 VITALS — BP 122/70 | HR 74 | Ht 70.0 in | Wt 254.8 lb

## 2014-12-25 DIAGNOSIS — F319 Bipolar disorder, unspecified: Secondary | ICD-10-CM | POA: Diagnosis not present

## 2014-12-25 MED ORDER — ESCITALOPRAM OXALATE 20 MG PO TABS: 20.0000 mg | ORAL_TABLET | Freq: Every day | ORAL | Status: DC

## 2014-12-25 MED ORDER — TEMAZEPAM 15 MG PO CAPS: 15.0000 mg | ORAL_CAPSULE | Freq: Every evening | ORAL | Status: DC | PRN

## 2014-12-25 MED ORDER — BENZTROPINE MESYLATE 1 MG PO TABS: 1.0000 mg | ORAL_TABLET | Freq: Every day | ORAL | Status: DC

## 2014-12-25 MED ORDER — TRAZODONE HCL 100 MG PO TABS: 100.0000 mg | ORAL_TABLET | Freq: Every day | ORAL | Status: DC

## 2014-12-25 NOTE — Progress Notes (Signed)
[facility] [phone] Progress Note  NIKOLAOS MADDOCKS [id number] 74 y.o.  12/25/2014 2:42 PM  Chief Complaint:  Medication management and followup.              History of Present Illness: Sean Hill came for his followup appointment.  He is complying with his medication and denies any side effects.  He is getting Abilify injection every 4 weeks.  He mentioned his depression and anxiety is under control.  He likes increase Lexapro .  He has no tremors or shakes.  His sleep is good.  He still has dry mouth but intensity is less from the past.  He is seeing [name] every 2 weeks and hoping to see once a month in the future.  He denies any anger, mania, feeling of hopelessness or worthlessness.  He is not involved in any self abusive behavior.  His appetite is okay.  His vitals are stable.  His wife is very supportive.  Patient denies drinking or using any illegal substances.    Suicidal Ideation: No Plan Formed: No Patient has means to carry out plan: No  Homicidal Ideation: No Plan Formed: No Patient has means to carry out plan: No  ROS  Psychiatric: Agitation: No Hallucination: No Depressed Mood: No Insomnia: No Hypersomnia: No Altered Concentration: No Feels Worthless: No Grandiose Ideas: No Belief In Special Powers: No New/Increased Substance Abuse: No Compulsions: No  Neurologic: Headache: No Seizure: No Paresthesias: No  Medical History:  Patient has a history of high triglycerides, irritable bowel syndrome, chronic pain, GERD.  His primary care physician is Dr. Shawna Orleans.  Psychosocial history.  Patient is born and raised in [place]. His been married once. He has 2 daughters. Patient lives with his wife. His daughter lives close by. He has good relationship with his daughter.  Past Psychiatric History/Hospitalization(s): Patient has at least 4-5 psychiatric hospitalization.  His last psychiatric admission was in July 2014 .  He has a history of taking  overdose on his medication and mixing with alcohol. He has a history of cutting his wrist.  He has taken Tegretol, Depakote, Ambien, Remeron, Vistaril , Geodon, Abilify, lithium, Provigil, Zoloft, Neurontin , Lamictal, Wellbutrin , Risperdal and Prstiq.  He had overdose on Latuda. Patient endorsed history of mania and severe impulsive behavior. He admitted history of buying unnecessary, getting speeding tickets and aggression.  Patient reported history of side effects due to polypharmacy.   Anxiety: Yes Bipolar Disorder: Yes Depression: Yes Mania: Yes Psychosis: Yes Schizophrenia: No Personality Disorder: No Hospitalization for psychiatric illness: Yes History of Electroconvulsive Shock Therapy: No Prior Suicide Attempts: Yes  Physical Exam: Constitutional:  BP 122/70 mmHg  Pulse 74  Ht _0  (1.778 m)  Wt 254 lb 12.8 oz (115.577 kg)  BMI 36.56 kg/m2   Recent Results (from the past 2160 hour(s))  TSH     Status: None   Collection Time: 12/03/14  3:47 PM  Result Value Ref Range   TSH 2.188 0.350 - 4.500 uIU/mL  Hemoglobin A1c     Status: Abnormal   Collection Time: 12/03/14  3:47 PM  Result Value Ref Range   Hgb A1c MFr Bld 5.9 (H) <5.7 %    Comment:  According to the ADA Clinical Practice Recommendations for 2011, when HbA1c is used as a screening test:     >=6.5%   Diagnostic of Diabetes Mellitus            (if abnormal result is confirmed)   5.7-6.4%   Increased risk of developing Diabetes Mellitus   References:Diagnosis and Classification of Diabetes Mellitus,Diabetes FHLK,5625,63(SLHTD 1):S62-S69 and Standards of Medical Care in         Diabetes - 2011,Diabetes SKAJ,6811,57 (Suppl 1):S11-S61.      Mean Plasma Glucose 123 (H) <117 mg/dL  COMPLETE METABOLIC PANEL WITH GFR     Status: Abnormal   Collection Time: 12/03/14  3:47 PM  Result Value Ref Range   Sodium 139 135 - 145 mEq/L   Potassium 4.3  3.5 - 5.3 mEq/L   Chloride 105 96 - 112 mEq/L   CO2 27 19 - 32 mEq/L   Glucose, Bld 109 (H) 70 - 99 mg/dL   BUN 9 6 - 23 mg/dL   Creat 0.88 0.50 - 1.35 mg/dL   Total Bilirubin 0.6 0.2 - 1.2 mg/dL   Alkaline Phosphatase 72 39 - 117 U/L   AST 27 0 - 37 U/L   ALT 26 0 - 53 U/L   Total Protein 6.7 6.0 - 8.3 g/dL   Albumin 3.9 3.5 - 5.2 g/dL   Calcium 9.7 8.4 - 10.5 mg/dL   GFR, Est African American >89 mL/min   GFR, Est Non African American 86 mL/min    Comment:   The estimated GFR is a calculation valid for adults (>=14 years old) that uses the CKD-EPI algorithm to adjust for age and sex. It is   not to be used for children, pregnant women, hospitalized patients,    patients on dialysis, or with rapidly changing kidney function. According to the NKDEP, eGFR >89 is normal, 60-89 shows mild impairment, 30-59 shows moderate impairment, 15-29 shows severe impairment and <15 is ESRD.     CBC with Differential     Status: None   Collection Time: 12/03/14  3:47 PM  Result Value Ref Range   WBC 6.5 4.0 - 10.5 K/uL   RBC 5.45 4.22 - 5.81 MIL/uL   Hemoglobin 16.2 13.0 - 17.0 g/dL   HCT 47.2 39.0 - 52.0 %   MCV 86.6 78.0 - 100.0 fL   MCH 29.7 26.0 - 34.0 pg   MCHC 34.3 30.0 - 36.0 g/dL   RDW 14.8 11.5 - 15.5 %   Platelets 259 150 - 400 K/uL   MPV 10.8 8.6 - 12.4 fL   Neutrophils Relative % 50 43 - 77 %   Neutro Abs 3.3 1.7 - 7.7 K/uL   Lymphocytes Relative 39 12 - 46 %   Lymphs Abs 2.5 0.7 - 4.0 K/uL   Monocytes Relative 6 3 - 12 %   Monocytes Absolute 0.4 0.1 - 1.0 K/uL   Eosinophils Relative 4 0 - 5 %   Eosinophils Absolute 0.3 0.0 - 0.7 K/uL   Basophils Relative 1 0 - 1 %   Basophils Absolute 0.1 0.0 - 0.1 K/uL   Smear Review Criteria for review not met     General Appearance: well nourished and obese  Musculoskeletal: Strength & Muscle Tone: within normal limits Gait & Station: normal Patient leans: Patient has normal posture  Mental status examination Patient is  casually dressed and fairly groomed.  He is pleasant and cooperative.  He maintained good eye contact.  He described his mood  good and his affect is okay. He denies any active or passive suicidal parts or homicidal thought.  There were no paranoia or delusions.  His psychomotor activity is normal.  His speech is slow but clear and coherent.  His fund of knowledge is good.  His thought process is slow but logical and goal directed.  He denies any auditory or visual hallucination.  There were no delusions or any paranoia.  His attention and concentration is fair.  He is alert and oriented x3.  His memory is good.  His insight judgment and impulse control is okay.  Established Problem, Stable/Improving (1), Review of Psycho-Social Stressors (1), Review or order clinical lab tests (1), Review of Last Therapy Session (1) and Review of Medication Regimen & Side Effects (2)  Assessment: Axis I: Bipolar disorder NOS  Axis II: Deferred  Axis III:  Patient Active Problem List   Diagnosis Date Noted  . Gastroesophageal reflux disease 11/28/2011  . Bipolar disorder 11/25/2011    Plan:  I reviewed his blood work which is normal.  He is normal TSH, CBC and basic chemistry.  His hemoglobin A1c is 5.9. Patient is doing better on his current medication.  He has no side effects.  Recommended to continue Abilify injection 400 mg IM every 4 weeks, Cogentin 1 mg at bedtime, temazepam 15 mg at bedtime, trazodone 100 mg at bedtime and Lexapro 20 mg daily.  Encouraged to keep appointment with [facility] for therapy.  Recommended to call us back if he has any question or any concern.  Follow-up in 3 months.  Kealey Kemmer T., MD 12/25/2014

## 2015-01-02 ENCOUNTER — Encounter: Payer: Self-pay | Admitting: Internal Medicine

## 2015-01-02 ENCOUNTER — Ambulatory Visit (INDEPENDENT_AMBULATORY_CARE_PROVIDER_SITE_OTHER): Payer: Medicare HMO | Admitting: Internal Medicine

## 2015-01-02 VITALS — BP 132/80 | HR 85 | Temp 98.0°F | Ht 70.0 in | Wt 256.0 lb

## 2015-01-02 DIAGNOSIS — L309 Dermatitis, unspecified: Secondary | ICD-10-CM

## 2015-01-02 DIAGNOSIS — M7989 Other specified soft tissue disorders: Secondary | ICD-10-CM

## 2015-01-02 DIAGNOSIS — R194 Change in bowel habit: Secondary | ICD-10-CM

## 2015-01-02 LAB — BASIC METABOLIC PANEL
BUN: 10 mg/dL (ref 6–23)
CO2: 27 mEq/L (ref 19–32)
Calcium: 9.3 mg/dL (ref 8.4–10.5)
Chloride: 105 mEq/L (ref 96–112)
Creatinine, Ser: 0.93 mg/dL (ref 0.40–1.50)
GFR: 84.87 mL/min (ref >60.00)
Glucose, Bld: 96 mg/dL (ref 70–99)
POTASSIUM: 3.7 meq/L (ref 3.5–5.1)
SODIUM: 138 meq/L (ref 135–145)

## 2015-01-02 LAB — CBC WITH DIFFERENTIAL/PLATELET
BASOS ABS: 0 10*3/uL (ref 0.0–0.1)
BASOS PCT: 0.6 % (ref 0.0–3.0)
EOS ABS: 0.1 10*3/uL (ref 0.0–0.7)
Eosinophils Relative: 2.1 % (ref 0.0–5.0)
HCT: 45.6 % (ref 39.0–52.0)
Hemoglobin: 15.8 g/dL (ref 13.0–17.0)
LYMPHS PCT: 39.1 % (ref 12.0–46.0)
Lymphs Abs: 2 10*3/uL (ref 0.7–4.0)
MCHC: 34.6 g/dL (ref 30.0–36.0)
MCV: 86 fl (ref 78.0–100.0)
Monocytes Absolute: 0.4 10*3/uL (ref 0.1–1.0)
Monocytes Relative: 8.1 % (ref 3.0–12.0)
NEUTROS PCT: 50.1 % (ref 43.0–77.0)
Neutro Abs: 2.5 10*3/uL (ref 1.4–7.7)
PLATELETS: 232 10*3/uL (ref 150.0–400.0)
RBC: 5.3 Mil/uL (ref 4.22–5.81)
RDW: 14.8 % (ref 11.5–15.5)
WBC: 5 10*3/uL (ref 4.0–10.5)

## 2015-01-02 LAB — SEDIMENTATION RATE: SED RATE: 6 mm/h (ref 0–22)

## 2015-01-02 LAB — HEPATIC FUNCTION PANEL
ALBUMIN: 3.9 g/dL (ref 3.5–5.2)
ALT: 29 U/L (ref 0–53)
AST: 30 U/L (ref 0–37)
Alkaline Phosphatase: 66 U/L (ref 39–117)
Bilirubin, Direct: 0.1 mg/dL (ref 0.0–0.3)
Total Bilirubin: 0.5 mg/dL (ref 0.2–1.2)
Total Protein: 6.6 g/dL (ref 6.0–8.3)

## 2015-01-02 MED ORDER — TRIAMCINOLONE ACETONIDE 0.025 % EX CREA: 1.0000 "application " | TOPICAL_CREAM | Freq: Two times a day (BID) | CUTANEOUS | Status: DC

## 2015-01-02 NOTE — Progress Notes (Signed)
Pre visit review using our clinic review tool, if applicable. No additional management support is needed unless otherwise documented below in the visit note. 

## 2015-01-02 NOTE — Progress Notes (Addendum)
Subjective:    Patient ID: Sean Hill, male    DOB: 23-Sep-1940, 74 y.o.   MRN: [id number]  HPI  74 year old white male with history of Bipolar disorder presents chief complaint of left leg swelling.  It has been gradually worsening over last several weeks.  No history of injury or trauma.  He is accompanied by supportive wife.  She noticed left leg swelling was much worse yesterday.  Patient also noticed possible rash described as flat red spots.  She looked up on internet and thought they could be petechiae.  Area involved does not itch.  Lower extremity swelling worse with prolonged standing.  I last saw patient is 12/27/2012.  Patient complained of diarrhea.  He recalls he was referred to GI for colonoscopy.  He never scheduled colonoscopy as directed.  He has never completed screening colonoscopy.   Review of Systems Negative for chest pain or shortness of breath.      Past Medical History  Diagnosis Date  . Bipolar disorder     Has psychiatrist.  BH admission (suicidal) 07/20/11.  Marland Kitchen Hyperlipemia     Intol of meds: GI side effects  . 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  No hx of MRSA infection.  . 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 Nephrolithiasis   . GERD (gastroesophageal reflux disease)   . Inguinal hernia 02/25/11    Left sided hernia with fat  . Hip pain, left 2011-2012    Left hip and groin: MRI pelvis and left hip 02/18/11 showed NORMAL hip, with small inguinal hernia containing fat and sigmoid diverticulosis.  Hip pain presumably referred pain from hernia/diverticular dz??.  . Cervical spondylosis     Primarily C4-5 and C5-6--referred to [facility] 02/2011.  . Depression   . Bipolar disorder, current episode depressed, moderate   . Sleep apnea   . Asthma   . Overdose of benzodiazepine 04/30/2013    Status post    History   Social History  . Marital Status: Married   Spouse Name: N/A  . Number of Children: N/A  . Years of Education: N/A   Occupational History  . Not on file.   Social History Main Topics  . Smoking status: Never Smoker   . Smokeless tobacco: Not on file  . Alcohol Use: No     Comment: One drink rarely  . Drug Use: No  . Sexual Activity: Yes    Birth Control/ Protection: None     Comment: Same partner for 45 years   Other Topics Concern  . Not on file   Social History Narrative   Married, lives in [place].  Retired Dance movement psychotherapist.   Two daughters, both married, 2 grandchildren.   No regular exercise.  Never smoker.  No ETOH.  No drugs.    Past Surgical History  Procedure Laterality Date  . 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 Ankle fracture surgery      hardware still in both ankles (titanium)  . Inguinal hernia repair      Right side with mesh about 1993  . Elbow surgery      left---tendon  . Ankle surgery      Ankle tendon surgery  . Tonsillectomy    . Appendectomy      Family History  Problem Relation Age of Onset  . Alcohol abuse Mother   .  Alcohol abuse Father   . Depression Daughter   . Paranoid behavior Daughter     Allergies  Allergen Reactions  . Codeine   . Penicillins Hives and Other (See Comments)    Watery blisters    Current Outpatient Prescriptions on File Prior to Visit  Medication Sig Dispense Refill  . ARIPiprazole (ABILIFY MAINTENA) 400 MG SUSR Inject 400 mg into the muscle every 30 (thirty) days. 1 each 0  . benztropine (COGENTIN) 1 MG tablet Take 1 tablet (1 mg total) by mouth daily. 30 tablet 2  . escitalopram (LEXAPRO) 20 MG tablet Take 1 tablet (20 mg total) by mouth daily. 30 tablet 2  . omeprazole (PRILOSEC) 40 MG capsule TAKE ONE CAPSULE BY MOUTH TWICE DAILY  60 capsule 10  . temazepam (RESTORIL) 15 MG capsule Take 1 capsule (15 mg total) by mouth at bedtime as needed. for sleep 30 capsule 2  . traZODone (DESYREL) 100 MG tablet Take 1 tablet (100 mg  total) by mouth at bedtime. 30 tablet 2   No current facility-administered medications on file prior to visit.    BP 132/80 mmHg  Pulse 85  Temp(Src) 98 F (36.7 C) (Oral)  Ht 5\' 10"  (1.778 m)  Wt 256 lb (116.121 kg)  BMI 36.73 kg/m2    Objective:   Physical Exam  Constitutional: He appears well-developed and well-nourished. No distress.  HENT:  Head: Normocephalic and atraumatic.  Cardiovascular: Normal rate, regular rhythm and normal heart sounds.   No murmur heard. Pulmonary/Chest: Effort normal and breath sounds normal. He has no wheezes.  Musculoskeletal:  +1 pitting edema left lower leg.  No redness or calf tenderness  Skin: Skin is warm and dry.  Scattered macular erythematous circular spots distributed over left lower leg, non tender  Psychiatric: He has a normal mood and affect. His behavior is normal.          Assessment & Plan:

## 2015-01-05 ENCOUNTER — Ambulatory Visit (HOSPITAL_COMMUNITY): Payer: Medicare HMO | Attending: Cardiovascular Disease | Admitting: Cardiology

## 2015-01-05 DIAGNOSIS — M7989 Other specified soft tissue disorders: Secondary | ICD-10-CM | POA: Insufficient documentation

## 2015-01-05 NOTE — Progress Notes (Signed)
Left lower venous duplex performed  

## 2015-01-06 DIAGNOSIS — M7989 Other specified soft tissue disorders: Secondary | ICD-10-CM | POA: Insufficient documentation

## 2015-01-06 DIAGNOSIS — R194 Change in bowel habit: Secondary | ICD-10-CM | POA: Insufficient documentation

## 2015-01-06 NOTE — Assessment & Plan Note (Signed)
Patent's left lower leg swelling likely due to venous insufficiency.  Obtain venous doppler to rule out DVT.  He has rash near ankles.  I doubt petechiae or vasculitis.  Check CBCD.  Trial of topical steroid.

## 2015-01-06 NOTE — Assessment & Plan Note (Signed)
Patient seen in the past for loose stools.  He was referred for colonoscopy but he declined.  He again defers screening colonoscopy.  We will look into Cologuard covered by his insurance.

## 2015-01-06 NOTE — Addendum Note (Signed)
Addended by: Rosine Abe on: 01/06/2015 02:13 PM   Modules accepted: Orders

## 2015-01-09 ENCOUNTER — Telehealth (HOSPITAL_COMMUNITY): Payer: Self-pay | Admitting: *Deleted

## 2015-01-09 ENCOUNTER — Telehealth: Payer: Self-pay | Admitting: *Deleted

## 2015-01-09 NOTE — Telephone Encounter (Signed)
Pt aware.

## 2015-01-09 NOTE — Telephone Encounter (Signed)
Patient's appointment for 03-27-15 needs to be rescheduled--provider out. LMOM for patient to call us back to r/s.

## 2015-01-09 NOTE — Telephone Encounter (Signed)
-----   Message from [place], DO sent at 01/06/2015  2:18 PM EDT ----- Call pt - venous doppler of lower left leg negative for DVT

## 2015-01-16 ENCOUNTER — Other Ambulatory Visit (HOSPITAL_COMMUNITY): Payer: Self-pay | Admitting: Psychiatry

## 2015-01-16 NOTE — Telephone Encounter (Signed)
Declined new orders for patient's Lexapro and Trazodone as new orders were e-scribed on 12/25/14 plus 2 refills.

## 2015-01-16 NOTE — Telephone Encounter (Signed)
Dr. Adele Schilder,   Received refill request for patient's medication Lexapro x 2 today.  [facility]. Per AJ at pharmacy on 12-25-2014 the system converted over from [facility].  When the system converted over a lot of patient's information or pescriptions did not convert over.  Per AJ the last RX's filled for patient they can see in system was in March, they do not see RX's dated for 12-25-2014.  Per AJ the [facility] that night, 12-25-2014.  I called patient.  I explained to patient what pharmacy told me about the conversion.  Patient stated he was in the pharmacy on 12-25-2014 and was able to get his RX's.  Patient stated he has enough medication until 01-24-2015. New RX's on 01-24-15 will have to be resent to the pharmacy.  Patient will be here 01-22-2015 for injection.  Next office visit will be 03-26-2015.

## 2015-01-19 ENCOUNTER — Ambulatory Visit: Payer: Self-pay | Admitting: Internal Medicine

## 2015-01-19 ENCOUNTER — Other Ambulatory Visit (HOSPITAL_COMMUNITY): Payer: Self-pay | Admitting: Psychiatry

## 2015-01-20 ENCOUNTER — Other Ambulatory Visit (HOSPITAL_COMMUNITY): Payer: Self-pay | Admitting: Psychiatry

## 2015-01-20 NOTE — Telephone Encounter (Signed)
Given on 12/25/15 with 2 refills.

## 2015-01-21 ENCOUNTER — Other Ambulatory Visit (HOSPITAL_COMMUNITY): Payer: Self-pay | Admitting: Psychiatry

## 2015-01-21 NOTE — Telephone Encounter (Signed)
Lexapro refill request was declined as new orders were e-scribed 12/25/14 plus 2 refills by Dr. Adele Schilder and were confirmed received.

## 2015-01-22 ENCOUNTER — Ambulatory Visit (INDEPENDENT_AMBULATORY_CARE_PROVIDER_SITE_OTHER): Payer: Medicare HMO | Admitting: Psychiatry

## 2015-01-22 ENCOUNTER — Telehealth (HOSPITAL_COMMUNITY): Payer: Self-pay | Admitting: *Deleted

## 2015-01-22 DIAGNOSIS — F319 Bipolar disorder, unspecified: Secondary | ICD-10-CM | POA: Diagnosis not present

## 2015-01-22 DIAGNOSIS — F3131 Bipolar disorder, current episode depressed, mild: Secondary | ICD-10-CM

## 2015-01-22 MED ORDER — ARIPIPRAZOLE ER 400 MG IM SUSR
400.0000 mg | INTRAMUSCULAR | Status: DC
  Administered 2015-01-22: 400 mg via INTRAMUSCULAR

## 2015-01-22 MED ORDER — ESCITALOPRAM OXALATE 20 MG PO TABS: 20.0000 mg | ORAL_TABLET | Freq: Every day | ORAL | Status: DC

## 2015-01-22 MED ORDER — ARIPIPRAZOLE ER 400 MG IM SUSR: 400.0000 mg | INTRAMUSCULAR | Status: DC

## 2015-01-22 NOTE — Progress Notes (Signed)
D. Patient presented with appropriate affect, level and pleasant mood and denied any auditory or visual hallucinations, no suicidal or homicidal ideations and denied any problems with sleep or appetite or other symptoms.  Patient stated he was doing well with hi Abilify Maintena injections and [facility] [place] [facility] on there lost refill of his Lexapro.  A. Patient's due Abilify Maintena 400mg  IM injection prepared as ordered and given to patient in his left upper outer gluteal quadrant.  R. Patient tolerated the injection without complaint of pain or discomfort.  Hope Pigeon, CMA will call pharmacy to verify his past order for Lexapro and needed refill.  Patient to call if any further problems and will see again in one month.

## 2015-01-22 NOTE — Telephone Encounter (Signed)
Patient came in today for an injection.  Patient stated he is out of Lexapro.  Note in EPIC 01-16-15 stated RX that was sent on 12-25-14 was not converted over, last RX on file was March.  Per Emeterio Reeve., RN okay to send RX of Lexapro with one refill for patient.

## 2015-01-28 ENCOUNTER — Ambulatory Visit: Payer: Self-pay | Admitting: Internal Medicine

## 2015-02-11 ENCOUNTER — Ambulatory Visit: Payer: Self-pay | Admitting: Internal Medicine

## 2015-02-23 ENCOUNTER — Encounter (HOSPITAL_COMMUNITY): Payer: Self-pay | Admitting: Psychiatry

## 2015-02-23 ENCOUNTER — Ambulatory Visit (INDEPENDENT_AMBULATORY_CARE_PROVIDER_SITE_OTHER): Payer: Medicare HMO | Admitting: Psychiatry

## 2015-02-23 VITALS — BP 126/79 | HR 72 | Ht 69.0 in | Wt 257.0 lb

## 2015-02-23 DIAGNOSIS — F319 Bipolar disorder, unspecified: Secondary | ICD-10-CM

## 2015-02-23 MED ORDER — ARIPIPRAZOLE ER 400 MG IM SUSR
400.0000 mg | INTRAMUSCULAR | Status: DC
  Administered 2015-02-23: 400 mg via INTRAMUSCULAR

## 2015-02-23 NOTE — Progress Notes (Signed)
Patient ID: Sean Hill, male   DOB: 11/06/1940, 74 y.o.   MRN: [id number] D. Patient presented with appropriate affect, level mood but did admit some increased signs of depression over the past week.  Patient stated he felt this may be due to all the recent rain and denied any suicidal or homicidal ideations.  Patient stated he was sleeping fine with a good appetite.  Stated he was having some chronic pain today in his neck, shoulders and knees which he has had total replacement.  Pain at a 4 out of 0-10 scale with 0 being none and 10 being the highest he could suffer. Patient's Abilify Maintena 400mg  IM injection prepared as ordered and given to patient in his right upper outer gluteal quadrant.  R. Patient tolerated injection without complaint of pain or discomfort and agreed to call back if depressive symptoms did not improve with upcoming weather change or if felt needed to be seen by Dr. Adele Schilder.  Patient stable at this time and stated no pain with injection received today.  Patient to call as needed.

## 2015-03-11 ENCOUNTER — Ambulatory Visit (INDEPENDENT_AMBULATORY_CARE_PROVIDER_SITE_OTHER): Payer: Medicare HMO | Admitting: *Deleted

## 2015-03-11 DIAGNOSIS — Z23 Encounter for immunization: Secondary | ICD-10-CM | POA: Diagnosis not present

## 2015-03-11 LAB — COLOGUARD: COLOGUARD: POSITIVE

## 2015-03-13 ENCOUNTER — Ambulatory Visit: Payer: Self-pay | Admitting: *Deleted

## 2015-03-15 ENCOUNTER — Other Ambulatory Visit (HOSPITAL_COMMUNITY): Payer: Self-pay | Admitting: Psychiatry

## 2015-03-15 DIAGNOSIS — F319 Bipolar disorder, unspecified: Secondary | ICD-10-CM

## 2015-03-17 ENCOUNTER — Other Ambulatory Visit (HOSPITAL_COMMUNITY): Payer: Self-pay | Admitting: Psychiatry

## 2015-03-17 NOTE — Telephone Encounter (Signed)
Chart reviewed, Refill appropriate. Not enough until next appointment July 20th.

## 2015-03-18 NOTE — Telephone Encounter (Signed)
Patient's prescription refill for Cogentin refilled per protocol for one time after patient's appointment had to be moved back from 04/08/15 to 03/24/15 per Dr. Adele Schilder.

## 2015-03-24 ENCOUNTER — Ambulatory Visit (INDEPENDENT_AMBULATORY_CARE_PROVIDER_SITE_OTHER): Payer: Medicare HMO | Admitting: Psychiatry

## 2015-03-24 VITALS — BP 136/72 | HR 73 | Ht 70.0 in | Wt 255.4 lb

## 2015-03-24 DIAGNOSIS — F3131 Bipolar disorder, current episode depressed, mild: Secondary | ICD-10-CM | POA: Diagnosis not present

## 2015-03-24 DIAGNOSIS — F319 Bipolar disorder, unspecified: Secondary | ICD-10-CM

## 2015-03-24 MED ORDER — ARIPIPRAZOLE ER 400 MG IM SUSR
400.0000 mg | INTRAMUSCULAR | Status: DC
  Administered 2015-03-24: 400 mg via INTRAMUSCULAR

## 2015-03-24 MED ORDER — ARIPIPRAZOLE ER 400 MG IM SUSR: 400.0000 mg | INTRAMUSCULAR | Status: DC

## 2015-03-24 NOTE — Progress Notes (Signed)
Patient ID: Sean Hill, male   DOB: [birth year], 74 y.o.   MRN: [id number] D. Patient presented with flat affect, level mood but admitted he has been a little more depressed as of late.  Patient denied any suicidal or homicidal ideations, no auditory or visual hallucinations and no problems with sleep or appetite but admits to isolating more and just not feeling as good.  Patient plans to discuss with Dr. Adele Schilder at evaluation scheduled for 04/08/15.  A. Patient's due Abilify Maintena 400mg  IM injection prepared as ordered and given to patient in his Left upper outer gluteal quadrant.  R. Patient tolerated injection without complaint of pain or discomfort and agreed to return in 30 days for next injection and to see Dr. Adele Schilder on 04/08/15.  Patient to call if any problems before then as Dr. Adele Schilder is currently on vacation but will have another provider follow up if needed.  Again patient denied any SI or HI and will call if any change or worsening of symptoms

## 2015-03-27 ENCOUNTER — Ambulatory Visit (HOSPITAL_COMMUNITY): Payer: Self-pay | Admitting: Psychiatry

## 2015-04-08 ENCOUNTER — Ambulatory Visit (INDEPENDENT_AMBULATORY_CARE_PROVIDER_SITE_OTHER): Payer: Medicare HMO | Admitting: Psychiatry

## 2015-04-08 ENCOUNTER — Encounter (HOSPITAL_COMMUNITY): Payer: Self-pay | Admitting: Psychiatry

## 2015-04-08 VITALS — BP 120/76 | HR 82 | Temp 97.9°F | Resp 19 | Ht 70.0 in | Wt 259.8 lb

## 2015-04-08 DIAGNOSIS — F319 Bipolar disorder, unspecified: Secondary | ICD-10-CM | POA: Diagnosis not present

## 2015-04-08 MED ORDER — ESCITALOPRAM OXALATE 20 MG PO TABS: ORAL_TABLET | ORAL | Status: DC

## 2015-04-08 MED ORDER — TEMAZEPAM 15 MG PO CAPS: 15.0000 mg | ORAL_CAPSULE | Freq: Every evening | ORAL | Status: DC | PRN

## 2015-04-08 MED ORDER — TRAZODONE HCL 100 MG PO TABS: 100.0000 mg | ORAL_TABLET | Freq: Every day | ORAL | Status: DC

## 2015-04-08 MED ORDER — BENZTROPINE MESYLATE 1 MG PO TABS: ORAL_TABLET | ORAL | Status: DC

## 2015-04-08 NOTE — Progress Notes (Signed)
[place] [phone] Progress Note  Sean Hill [id number] 74 y.o.  04/08/2015 9:01 AM  Chief Complaint:  Medication management and followup.              History of Present Illness: Sean Hill came for his followup appointment.  He is taking his medication as prescribed.  Sometime he feel dizzy when he stands up in the morning.  However overall he is tolerating his medication without any side effects.  He denies any recent manic episodes.  Few weeks ago he was feeling sad and depressed but he handled his illness very well.  He denies any specific reason which caused him sad.  He sleeping good.  He denies any irritability, anger, mood swing.  He denies any paranoia or any hallucination.  He is seeing [name] for counseling. He is not involved in any self abusive behavior.  His appetite is okay.  His vitals are stable.  His wife is very supportive.  Patient denies drinking or using any illegal substances.  He is taking Cogentin, Lexapro, temazepam, trazodone and Abilify injection every 4 weeks.  He saw his primary care physician Dr. Shawna Orleans in April and is happy that his lab results were normal.  There were no changes in his medication.  Suicidal Ideation: No Plan Formed: No Patient has means to carry out plan: No  Homicidal Ideation: No Plan Formed: No Patient has means to carry out plan: No  Review of Systems  Neurological: Positive for dizziness.       Occasional dizziness    Psychiatric: Agitation: No Hallucination: No Depressed Mood: No Insomnia: No Hypersomnia: No Altered Concentration: No Feels Worthless: No Grandiose Ideas: No Belief In Special Powers: No New/Increased Substance Abuse: No Compulsions: No  Neurologic: Headache: No Seizure: No Paresthesias: No  Medical History:  Patient has a history of high triglycerides, irritable bowel syndrome, chronic pain, GERD.  His primary care physician is Dr. Shawna Orleans.  Psychosocial history.  Patient is born and raised  in [place]. His been married once. He has 2 daughters. Patient lives with his wife. His daughter lives close by. He has good relationship with his daughter.  Past Psychiatric History/Hospitalization(s): Patient has at least 4-5 psychiatric hospitalization.  His last psychiatric admission was in July 2014 .  He has a history of taking overdose on his medication and mixing with alcohol. He has a history of cutting his wrist.  He has taken Tegretol, Depakote, Ambien, Remeron, Vistaril , Geodon, Abilify, lithium, Provigil, Zoloft, Neurontin , Lamictal, Wellbutrin , Risperdal and Prstiq.  He had overdose on Latuda. Patient endorsed history of mania and severe impulsive behavior. He admitted history of buying unnecessary, getting speeding tickets and aggression.  Patient reported history of side effects due to polypharmacy.   Anxiety: Yes Bipolar Disorder: Yes Depression: Yes Mania: Yes Psychosis: Yes Schizophrenia: No Personality Disorder: No Hospitalization for psychiatric illness: Yes History of Electroconvulsive Shock Therapy: No Prior Suicide Attempts: Yes  Physical Exam: Constitutional:  BP 120/76 mmHg  Pulse 82  Temp(Src) 97.9 F (36.6 C)  Resp 19  Ht 5\' 10"  (1.778 m)  Wt 259 lb 12.8 oz (117.845 kg)  BMI 37.28 kg/m2  SpO2 100%   No results found for this or any previous visit (from the past 2160 hour(s)).  General Appearance: well nourished and obese  Musculoskeletal: Strength & Muscle Tone: within normal limits Gait & Station: normal Patient leans: Patient has normal posture  Mental status examination Patient is casually dressed and fairly groomed.  He is pleasant and cooperative.  He maintained good eye contact.  He described his mood good and his affect is okay. He denies any active or passive suicidal parts or homicidal thought.  There were no paranoia or delusions.  His psychomotor activity is normal.  His speech is slow but clear and coherent.  His fund of  knowledge is good.  His thought process is slow but logical and goal directed.  He denies any auditory or visual hallucination.  There were no delusions or any paranoia.  His attention and concentration is fair.  He is alert and oriented x3.  His memory is good.  His insight judgment and impulse control is okay.  Established Problem, Stable/Improving (1), Review of Psycho-Social Stressors (1), Review or order clinical lab tests (1), Review of Last Therapy Session (1) and Review of Medication Regimen & Side Effects (2)  Assessment: Axis I: Bipolar disorder NOS  Axis II: Deferred  Axis III:  Patient Active Problem List   Diagnosis Date Noted  . Left leg swelling 01/06/2015  . Change in bowel habits 01/06/2015  . Gastroesophageal reflux disease 11/28/2011  . Bipolar disorder 11/25/2011    Plan:  I discussed medication side effects especially post and posture .  I also recommended that he can try taking half doses of trazodone to see if it helps resolve his dizziness.  However in the past patient had tried low-dose trazodone which did not resolve his insomnia.  Discussed medication side effects and benefits.  I also reviewed his blood work results from April.  Encouraged to see [name] for counseling.  Discuss to work as a Psychologist, occupational to keep himself busy.  Discussed sleep hygiene and weight maintenance.  Overall patient is fairly stable on his medication.  I will continue Abilify injection 400 mg IM every 4 weeks, Cogentin 1 mg at bedtime, temazepam 15 mg at bedtime, trazodone 100 mg  half to one tablet at bedtime and Lexapro 20 mg daily. Follow-up in 3 months.  ARFEEN,SYED T., MD 04/08/2015

## 2015-04-13 ENCOUNTER — Other Ambulatory Visit (HOSPITAL_COMMUNITY): Payer: Self-pay | Admitting: Psychiatry

## 2015-04-27 ENCOUNTER — Ambulatory Visit (INDEPENDENT_AMBULATORY_CARE_PROVIDER_SITE_OTHER): Payer: Medicare HMO | Admitting: Psychiatry

## 2015-04-27 VITALS — BP 129/67 | HR 74 | Ht 70.5 in | Wt 258.8 lb

## 2015-04-27 DIAGNOSIS — F3131 Bipolar disorder, current episode depressed, mild: Secondary | ICD-10-CM

## 2015-04-27 MED ORDER — ARIPIPRAZOLE ER 400 MG IM SUSR: 400.0000 mg | INTRAMUSCULAR | Status: DC

## 2015-04-27 MED ORDER — ARIPIPRAZOLE ER 400 MG IM SUSR
400.0000 mg | INTRAMUSCULAR | Status: DC
  Administered 2015-04-27: 400 mg via INTRAMUSCULAR

## 2015-04-27 NOTE — Progress Notes (Signed)
Patient ID: Sean Hill, male   DOB: Dec 31, 1940, 74 y.o.   MRN: [id number] D. Patient presented with appropriate affect, level and pleasant mood and denied any auditory or visual hallucinations and no problems with sleep, appetite or other symptoms or concerns.   A. Patient's due Abilify Maintena 400mg  IM injection prepared as ordered and given to patient in his right upper outer gluteal quadrant.  R. Patient tolerated injection without complaint of pain or discomfort and will return in 1 month for next injection. Patient stated doing well on medication with no concerns today and will call if any problems.

## 2015-05-07 ENCOUNTER — Emergency Department (HOSPITAL_COMMUNITY): Payer: Medicare HMO

## 2015-05-07 ENCOUNTER — Encounter (HOSPITAL_COMMUNITY): Payer: Self-pay | Admitting: Emergency Medicine

## 2015-05-07 ENCOUNTER — Emergency Department (HOSPITAL_COMMUNITY)
Admission: EM | Admit: 2015-05-07 | Discharge: 2015-05-08 | Disposition: A | Discharge status: Home / Self Care | Payer: Medicare HMO | Attending: Emergency Medicine | Admitting: Emergency Medicine

## 2015-05-07 DIAGNOSIS — E785 Hyperlipidemia, unspecified: Secondary | ICD-10-CM | POA: Insufficient documentation

## 2015-05-07 DIAGNOSIS — Z8739 Personal history of other diseases of the musculoskeletal system and connective tissue: Secondary | ICD-10-CM | POA: Insufficient documentation

## 2015-05-07 DIAGNOSIS — H532 Diplopia: Secondary | ICD-10-CM | POA: Insufficient documentation

## 2015-05-07 DIAGNOSIS — Z79899 Other long term (current) drug therapy: Secondary | ICD-10-CM | POA: Diagnosis not present

## 2015-05-07 DIAGNOSIS — J45909 Unspecified asthma, uncomplicated: Secondary | ICD-10-CM | POA: Diagnosis not present

## 2015-05-07 DIAGNOSIS — Z87442 Personal history of urinary calculi: Secondary | ICD-10-CM | POA: Insufficient documentation

## 2015-05-07 DIAGNOSIS — F319 Bipolar disorder, unspecified: Secondary | ICD-10-CM | POA: Insufficient documentation

## 2015-05-07 DIAGNOSIS — I10 Essential (primary) hypertension: Secondary | ICD-10-CM | POA: Insufficient documentation

## 2015-05-07 DIAGNOSIS — Z8614 Personal history of Methicillin resistant Staphylococcus aureus infection: Secondary | ICD-10-CM | POA: Diagnosis not present

## 2015-05-07 DIAGNOSIS — Z880 Allergy status to penicillin: Secondary | ICD-10-CM | POA: Diagnosis not present

## 2015-05-07 DIAGNOSIS — K219 Gastro-esophageal reflux disease without esophagitis: Secondary | ICD-10-CM | POA: Insufficient documentation

## 2015-05-07 DIAGNOSIS — Z7982 Long term (current) use of aspirin: Secondary | ICD-10-CM | POA: Insufficient documentation

## 2015-05-07 LAB — I-STAT CHEM 8, ED
BUN: 10 mg/dL (ref 6–20)
CREATININE: 1 mg/dL (ref 0.61–1.24)
Calcium, Ion: 1.13 mmol/L (ref 1.13–1.30)
Chloride: 107 mmol/L (ref 101–111)
GLUCOSE: 108 mg/dL — AB (ref 65–99)
HEMATOCRIT: 48 % (ref 39.0–52.0)
HEMOGLOBIN: 16.3 g/dL (ref 13.0–17.0)
Potassium: 3.7 mmol/L (ref 3.5–5.1)
Sodium: 139 mmol/L (ref 135–145)
TCO2: 22 mmol/L (ref 0–100)

## 2015-05-07 NOTE — ED Notes (Signed)
Pt arrived to the Ed with a complaint of diplopia.  Pt states symptoms started around 1200 hrs today.  Pt states he thought it would go away but it didn't and he developed a headache.  Pt states he can put his hand over one eye and see normally.Pt states he has had a productive but forceful cough for over two weeks.

## 2015-05-07 NOTE — ED Provider Notes (Signed)
CSN: [id number]     Arrival date & time 05/07/15  2025 History   First MD Initiated Contact with Patient 05/07/15 2258     Chief Complaint  Patient presents with  . Diplopia     (Consider location/radiation/quality/duration/timing/severity/associated sxs/prior Treatment) HPI  Sean Hill is a 74 y.o. male with past medical history of hyperlipidemia, hypertension presenting today with appropriate. Patient states this began around noon today. It has been persistent all day long and associated with a left-sided headache. He states he can see normally when he puts his hand over either eye. He's also had a productive cough over the past several weeks. He denies ever having this in the past. He denies other neurological symptoms such as weakness or numbness. Denies any history of stroke in the past. Patient has no further complaints.  10 Systems reviewed and are negative for acute change except as noted in the HPI.    Past Medical History  Diagnosis Date  . Bipolar disorder     Has psychiatrist.  [facility] admission (suicidal) 07/20/11.  Marland Kitchen Hyperlipemia     Intol of meds: GI side effects  . 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  No hx of MRSA infection.  . 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 Nephrolithiasis   . GERD (gastroesophageal reflux disease)   . Inguinal hernia 02/25/11    Left sided hernia with fat  . Hip pain, left 2011-2012    Left hip and groin: MRI pelvis and left hip 02/18/11 showed NORMAL hip, with small inguinal hernia containing fat and sigmoid diverticulosis.  Hip pain presumably referred pain from hernia/diverticular dz??.  . Cervical spondylosis     Primarily C4-5 and C5-6--referred to [facility] 02/2011.  . Depression   . Bipolar disorder, current episode depressed, moderate   . Sleep apnea   . Asthma   . Overdose of benzodiazepine 04/30/2013    Status post  . Hypertension 02/23/15     Dr. Parke Poisson started Lisinopril   Past Surgical History  Procedure Laterality Date  . 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 Ankle fracture surgery      hardware still in both ankles (titanium)  . Inguinal hernia repair      Right side with mesh about 1993  . Elbow surgery      left---tendon  . Ankle surgery      Ankle tendon surgery  . Tonsillectomy    . Appendectomy     Family History  Problem Relation Age of Onset  . Alcohol abuse Mother   . Alcohol abuse Father   . Depression Daughter   . Paranoid behavior Daughter    Social History  Substance Use Topics  . Smoking status: Never Smoker   . Smokeless tobacco: None  . Alcohol Use: No     Comment: One drink rarely    Review of Systems    Allergies  Codeine and Penicillins  Home Medications   Prior to Admission medications   Medication Sig Start Date End Date Taking? Authorizing Provider  Apoaequorin (PREVAGEN PO) Take 1 tablet by mouth daily.   Yes Historical Provider, MD  ARIPiprazole (ABILIFY MAINTENA) 400 MG SUSR Inject 400 mg into the muscle every 30 (thirty) days. 04/27/15  Yes [name], MD  aspirin 81 MG tablet Take 81 mg by mouth daily.   Yes Historical Provider, MD  benztropine (COGENTIN) 1 MG tablet TAKE 1 TABLET (1 MG TOTAL) BY MOUTH DAILY. Patient taking differently: Take 1 mg by mouth daily.  04/08/15  Yes [name], MD  escitalopram (LEXAPRO) 20 MG tablet TAKE 1 TABLET (20 MG TOTAL) BY MOUTH DAILY. Patient taking differently: Take 20 mg by mouth daily.  04/08/15  Yes [name], MD  Flaxseed, Linseed, (FLAXSEED OIL PO) Take 1 tablet by mouth daily.   Yes Historical Provider, MD  Multiple Vitamin (MULTIVITAMIN WITH MINERALS) TABS tablet Take 1 tablet by mouth daily.   Yes Historical Provider, MD  omeprazole (PRILOSEC) 40 MG capsule TAKE ONE CAPSULE BY MOUTH TWICE DAILY  Patient taking differently: TAKE ONE CAPSULE BY MOUTH TWICE DAILY 06/19/14  Yes Doe-Hyun R Shawna Orleans, DO   oxymetazoline (AFRIN) 0.05 % nasal spray Place 1 spray into both nostrils 2 (two) times daily.   Yes Historical Provider, MD  temazepam (RESTORIL) 15 MG capsule Take 1 capsule (15 mg total) by mouth at bedtime as needed. for sleep 04/08/15  Yes [name], MD  traZODone (DESYREL) 100 MG tablet Take 1 tablet (100 mg total) by mouth at bedtime. 04/08/15  Yes [name], MD  Vitamin D, Cholecalciferol, 1000 UNITS TABS Take 1 tablet by mouth daily.   Yes Historical Provider, MD  triamcinolone (KENALOG) 0.025 % cream Apply 1 application topically 2 (two) times daily. Patient not taking: Reported on 05/07/2015 01/02/15   Doe-Hyun R Shawna Orleans, DO   BP 137/70 mmHg  Pulse 72  Temp(Src) 97.9 F (36.6 C) (Oral)  Resp 21  SpO2 94% Physical Exam  Constitutional: He is oriented to person, place, and time. Vital signs are normal. He appears well-developed and well-nourished.  Non-toxic appearance. He does not appear ill. No distress.  HENT:  Head: Normocephalic and atraumatic.  Nose: Nose normal.  Mouth/Throat: Oropharynx is clear and moist. No oropharyngeal exudate.  Eyes: Conjunctivae and EOM are normal. Pupils are equal, round, and reactive to light. No scleral icterus.  Vision is 20/20 bilaterally.  Neck: Normal range of motion. Neck supple. No tracheal deviation, no edema, no erythema and normal range of motion present. No thyroid mass and no thyromegaly present.  Cardiovascular: Normal rate, regular rhythm, S1 normal, S2 normal, normal heart sounds, intact distal pulses and normal pulses.  Exam reveals no gallop and no friction rub.   No murmur heard. Pulses:      Radial pulses are 2+ on the right side, and 2+ on the left side.       Dorsalis pedis pulses are 2+ on the right side, and 2+ on the left side.  Pulmonary/Chest: Effort normal and breath sounds normal. No respiratory distress. He has no wheezes. He has no rhonchi. He has no rales.  Abdominal: Soft. Normal appearance and bowel sounds are  normal. He exhibits no distension, no ascites and no mass. There is no hepatosplenomegaly. There is no tenderness. There is no rebound, no guarding and no CVA tenderness.  Musculoskeletal: Normal range of motion. He exhibits no edema or tenderness.  Lymphadenopathy:    He has no cervical adenopathy.  Neurological: He is alert and oriented to person, place, and time. He has normal strength. No cranial nerve deficit or sensory deficit. He exhibits normal muscle tone.  Normal strength and sensation in all extremities. Normal cerebellar testing. Normal gait.  Skin: Skin is warm, dry and intact. No petechiae and no rash noted. He is not diaphoretic. No erythema. No pallor.  Psychiatric: He has  a normal mood and affect. His behavior is normal. Judgment normal.  Nursing note and vitals reviewed.   ED Course  Procedures (including critical care time) Labs Review Labs Reviewed  I-STAT CHEM 8, ED - Abnormal; Notable for the following:    Glucose, Bld 108 (*)    All other components within normal limits  CBC WITH DIFFERENTIAL/PLATELET    Imaging Review Dg Chest 2 View  05/08/2015   CLINICAL DATA:  Sudden onset of double vision.  EXAM: CHEST  2 VIEW  COMPARISON:  04/12/2008 most recent available  FINDINGS: No cardiomegaly.  Negative aortic and hilar contours.  Mild bronchitic changes are stable. There is no edema, consolidation, effusion, or pneumothorax. No acute osseous findings.  IMPRESSION: Stable exam.  No evidence of acute cardiopulmonary disease.   Electronically Signed   By: Monte Fantasia M.D.   On: 05/08/2015 00:11   Mr Brain Wo Contrast  05/08/2015   CLINICAL DATA:  Initial evaluation for acute headache, diplopia.  EXAM: MRI HEAD WITHOUT CONTRAST  MRV HEAD WITHOUT CONTRAST  TECHNIQUE: Multiplanar, multiecho pulse sequences of the brain and surrounding structures were obtained without intravenous contrast. Angiographic images of the intracranial venous structures were obtained using MRV  technique without intravenous contrast.  COMPARISON:  Prior CT from 04/24/2013.  FINDINGS: Mild diffuse prominence of the CSF containing spaces is compatible with generalized cerebral atrophy. Patchy and confluent T2/FLAIR hyperintensity within the periventricular and deep white matter both cerebral hemispheres noted, nonspecific, but most likely related to chronic small vessel ischemic disease.  No abnormal foci of restricted diffusion to suggest acute intracranial infarct. Minimal high signal intensity in the region of the posterior limb of the right internal capsule on axial DWI sequence favored to be artifactual in nature. Gray-white matter differentiation maintained. Normal intravascular flow voids are preserved. Vertebrobasilar system appears diminutive. No acute or chronic intracranial hemorrhage.  No mass lesion, mass effect, or midline shift. No hydrocephalus. No extra-axial fluid collection.  Craniocervical junction within normal limits. Mild degenerative changes present within the visualized upper cervical spine.  Pituitary gland normal.  No acute abnormality about the orbits.  Mild mucosal thickening within the ethmoidal air cells. Paranasal sinuses are otherwise clear. No mastoid effusion. Inner ear structures grossly normal.  Bone marrow signal intensity within normal limits. Scalp soft tissues unremarkable.  Dedicated MRV images demonstrate no focal filling defect to suggest venous sinus thrombosis. Superior sagittal, transverse, and sigmoid sinuses are widely patent. Visualized proximal internal jugular veins appear patent. Deep venous structures including the straight sinus and vein of Galen appear patent.  IMPRESSION: MRI HEAD IMPRESSION:  1. No acute intracranial process identified. 2. Mild atrophy with chronic small vessel ischemic disease.  MRV HEAD IMPRESSION:  Negative MRV of the brain.   Electronically Signed   By: Jeannine Boga M.D.   On: 05/08/2015 03:22   Mr Mrv Head Wo  Cm  05/08/2015   CLINICAL DATA:  Initial evaluation for acute headache, diplopia.  EXAM: MRI HEAD WITHOUT CONTRAST  MRV HEAD WITHOUT CONTRAST  TECHNIQUE: Multiplanar, multiecho pulse sequences of the brain and surrounding structures were obtained without intravenous contrast. Angiographic images of the intracranial venous structures were obtained using MRV technique without intravenous contrast.  COMPARISON:  Prior CT from 04/24/2013.  FINDINGS: Mild diffuse prominence of the CSF containing spaces is compatible with generalized cerebral atrophy. Patchy and confluent T2/FLAIR hyperintensity within the periventricular and deep white matter both cerebral hemispheres noted, nonspecific, but most likely related to chronic small vessel  ischemic disease.  No abnormal foci of restricted diffusion to suggest acute intracranial infarct. Minimal high signal intensity in the region of the posterior limb of the right internal capsule on axial DWI sequence favored to be artifactual in nature. Gray-white matter differentiation maintained. Normal intravascular flow voids are preserved. Vertebrobasilar system appears diminutive. No acute or chronic intracranial hemorrhage.  No mass lesion, mass effect, or midline shift. No hydrocephalus. No extra-axial fluid collection.  Craniocervical junction within normal limits. Mild degenerative changes present within the visualized upper cervical spine.  Pituitary gland normal.  No acute abnormality about the orbits.  Mild mucosal thickening within the ethmoidal air cells. Paranasal sinuses are otherwise clear. No mastoid effusion. Inner ear structures grossly normal.  Bone marrow signal intensity within normal limits. Scalp soft tissues unremarkable.  Dedicated MRV images demonstrate no focal filling defect to suggest venous sinus thrombosis. Superior sagittal, transverse, and sigmoid sinuses are widely patent. Visualized proximal internal jugular veins appear patent. Deep venous structures  including the straight sinus and vein of Galen appear patent.  IMPRESSION: MRI HEAD IMPRESSION:  1. No acute intracranial process identified. 2. Mild atrophy with chronic small vessel ischemic disease.  MRV HEAD IMPRESSION:  Negative MRV of the brain.   Electronically Signed   By: Jeannine Boga M.D.   On: 05/08/2015 03:22   I have personally reviewed and evaluated these images and lab results as part of my medical decision-making.   EKG Interpretation None      MDM   Final diagnoses:  Diplopia    Patient presents emergency primary for sudden onset hypoxia. Differential includes possible aneurysm, stroke, and central venous thrombosis due to history of recent viral URI. Will evaluate with MRI of the brain. Patient will be transferred to [facility] for the imaging study. Also will obtain chest x-ray for patient's productive cough.  MRI negative.  HA improved with toradol and reglan.  Optho follo up is tomorrow.  Patient stable for Dc.  Everlene Balls, MD 05/08/15 [phone]

## 2015-05-08 ENCOUNTER — Ambulatory Visit (HOSPITAL_COMMUNITY)
Admit: 2015-05-08 | Discharge: 2015-05-08 | Disposition: A | Discharge status: Home / Self Care | Payer: Medicare HMO | Attending: Emergency Medicine | Admitting: Emergency Medicine

## 2015-05-08 MED ORDER — METOCLOPRAMIDE HCL 10 MG PO TABS
10.0000 mg | ORAL_TABLET | Freq: Once | ORAL | Status: AC
  Administered 2015-05-08: 10 mg via ORAL
  Filled 2015-05-08: qty 1

## 2015-05-08 MED ORDER — KETOROLAC TROMETHAMINE 60 MG/2ML IM SOLN
60.0000 mg | Freq: Once | INTRAMUSCULAR | Status: AC
  Administered 2015-05-08: 60 mg via INTRAMUSCULAR
  Filled 2015-05-08: qty 2

## 2015-05-08 NOTE — ED Notes (Signed)
Pt transported to [facility] via [facility]

## 2015-05-08 NOTE — ED Notes (Signed)
Returned from [facility] after [place]

## 2015-05-08 NOTE — Discharge Instructions (Signed)
Diplopia Mr. Stirewalt, your MRI does not show any cause for your double vision.  See the eye doctor during your appointment later today for further evaluation.  Take ibuprofen as needed for headache.  If symptoms worsen, come back to the ED immediately.  Thank you   Double vision (diplopia) means that you are seeing two of everything. Diplopia usually occurs with both eyes open, and may be worse when looking in one particular direction. If both eyes must be open to see double, this is called binocular diplopia. If double images are seen in just one eye, this is called monocular diplopia.  CAUSES  Binocular Diplopia  Disorder affecting the muscles that move the eyes or the nerves that control those muscles.  Tumor or other mass pushing on an eye from beside or behind the eye.  Myasthenia gravis. This is a neuromuscular illness that causes the body's muscles to tire easily. The eye muscles and eyelid muscles become weak. The eyes do not track together well.  Grave's disease. This is an overactivity of the thyroid gland. This condition causes swelling of tissues around the eyes. This produces a bulging out of the eyeball.  Blowout fracture of the bone around the eye. The muscles of the eye socket are damaged. This often happens when the eye is hit with force.  Complications after certain surgeries, such as a lens implant after cataract surgery.  Fluid-filled mass (abscess) behind or beside the eye, infection, or abnormal connection between arteries and veins. Sometimes, no cause is found. Monocular Diplopia  Problems with corrective lens or contacts.  Corneal abrasion, infection, severe injury, or bulging and irregularity of the corneal surface (keratoconus).  Irregularities of the pupil from drugs, severe injury, or other causes.  Problems involving the lens of the eye, such as opacities or cataracts.  Complications after certain surgeries, such as a lens implant after cataract  surgery.  Retinal detachment or problems involving the blood vessels of the retina. Sometimes, no cause is found. SYMPTOMS  Binocular Diplopia  When one eye is closed or covered, the double images disappear. Monocular Diplopia  When the unaffected eye is closed or covered, the double images remain. The double images disappear when the affected eye is closed or covered. DIAGNOSIS  A diagnosis is made during an eye exam. TREATMENT  Treatment depends on the cause or underlying disease.   Relief of double vision symptoms may be achieved by patching one eye or by using special glasses.  Surgery on the muscles of the eye may be needed. SEEK IMMEDIATE MEDICAL CARE IF:   You see two images of a single object you are looking at, either with both eyes open or with just one eye open. Document Released: 07/07/2004 Document Revised: 11/28/2011 Document Reviewed: 04/23/2008 [facility] Patient Information 2015 [place], [place]. This information is not intended to replace advice given to you by your health care provider. Make sure you discuss any questions you have with your health care provider.

## 2015-05-08 NOTE — ED Notes (Signed)
Bed: MO70 Expected date:  Expected time:  Means of arrival:  Comments: Pt coming back, to [facility] MRI

## 2015-05-20 ENCOUNTER — Encounter: Payer: Self-pay | Admitting: Adult Health

## 2015-05-20 ENCOUNTER — Ambulatory Visit (INDEPENDENT_AMBULATORY_CARE_PROVIDER_SITE_OTHER): Payer: Medicare HMO | Admitting: Adult Health

## 2015-05-20 VITALS — Temp 98.8°F | Ht 70.5 in | Wt 259.3 lb

## 2015-05-20 DIAGNOSIS — H532 Diplopia: Secondary | ICD-10-CM

## 2015-05-20 DIAGNOSIS — J0120 Acute ethmoidal sinusitis, unspecified: Secondary | ICD-10-CM | POA: Diagnosis not present

## 2015-05-20 MED ORDER — MINOCYCLINE HCL 100 MG PO CAPS: 100.0000 mg | ORAL_CAPSULE | Freq: Two times a day (BID) | ORAL | Status: DC

## 2015-05-20 NOTE — Progress Notes (Signed)
Pre visit review using our clinic review tool, if applicable. No additional management support is needed unless otherwise documented below in the visit note. 

## 2015-05-20 NOTE — Progress Notes (Signed)
Subjective:    Patient ID: Sean Hill, male    DOB: 11/01/1940, 74 y.o.   MRN: [id number]  HPI  74 year old male who presents to the office today for ER follow-up. On 05/07/2015 he was sitting on the couch when he suddenly had double vision.He called this office and was told to report to the ER by the triage nurse.  Per ER note:   Patient states this began around noon today. It has been persistent all day long and associated with a left-sided headache. He states he can see normally when he puts his hand over either eye. He's also had a productive cough over the past several weeks. He denies ever having this in the past. He denies other neurological symptoms such as weakness or numbness. Denies any history of stroke in the past. Patient has no further complaints.  MRI of brain negative. Xray of chest negative  He continues to have blurred vision when using both eyes. In order to see clearly he has to keep one eye covered with a patch.   He saw opthalmology one week ago and they did not know the reason for the blurred vision  Denies any eye pain, headaches, fever, n/v/d.   He does endorse feeling like he has a sinus infection, which has been an ongoing issue from around the same time he went to the ER. Endorses sinus pain and pressure as well has rhinorrhea and dry cough.    Review of Systems  Unable to perform ROS Constitutional: Positive for fatigue. Negative for fever, chills, activity change and appetite change.  HENT: Positive for congestion, rhinorrhea and sinus pressure. Negative for ear discharge, ear pain, sore throat, tinnitus and trouble swallowing.   Eyes: Positive for visual disturbance. Negative for photophobia, pain, discharge, redness and itching.  Respiratory: Negative.   Cardiovascular: Negative.   Gastrointestinal: Negative.   Neurological: Negative.   All other systems reviewed and are negative.      Objective:   Physical Exam  Constitutional: He is  oriented to person, place, and time. He appears well-developed and well-nourished. No distress.  HENT:  Head: Normocephalic and atraumatic.  Right Ear: External ear normal.  Left Ear: External ear normal.  Nose: Nose normal.  Mouth/Throat: Oropharynx is clear and moist. No oropharyngeal exudate.  Eyes: Conjunctivae and EOM are normal. Pupils are equal, round, and reactive to light. Right eye exhibits no discharge. Left eye exhibits no discharge. No scleral icterus.  Neck: Normal range of motion. Neck supple.  Cardiovascular: Normal rate, regular rhythm, normal heart sounds and intact distal pulses.  Exam reveals no gallop and no friction rub.   No murmur heard. No carotid bruit  Pulmonary/Chest: Effort normal and breath sounds normal. No respiratory distress. He has no wheezes. He has no rales. He exhibits no tenderness.  Musculoskeletal: Normal range of motion. He exhibits no edema or tenderness.  Lymphadenopathy:    He has no cervical adenopathy.  Neurological: He is alert and oriented to person, place, and time. He has normal reflexes.  Skin: Skin is warm and dry. No rash noted. He is not diaphoretic. No erythema. No pallor.  Psychiatric: He has a normal mood and affect. His behavior is normal. Judgment and thought content normal.  Nursing note and vitals reviewed.     Assessment & Plan:  1. Diplopia - Unsure of etiology. Does not appear as Temporal Arteritis, stroke or aneurysm.  - Carotid; Future for possible occlusion in carotid arteries - Ambulatory  referral to Ophthalmology- Refer to [facility]  2. Acute ethmoidal sinusitis, recurrence not specified - Normal Saline nasal spray - minocycline (MINOCIN,DYNACIN) 100 MG capsule; Take 1 capsule (100 mg total) by mouth 2 (two) times daily.  Dispense: 14 capsule; Refill: 7 - Follow up if no improvement in the next 2-3 days.

## 2015-05-20 NOTE — Patient Instructions (Signed)
It was great meeting you today!  Someone will call you to schedule an appointment with the [facility] as well as for the Carotid Ultrasound.   I have sent in a prescription for Doxycycline. Please take this twice a day for 7 days. If out in the sun, please use sunscreen and keep covered as much as you can because this medication can cause you to get a sun burn more easily.   Please go to the ER with any pain, increased blurred vision or any other concerning symptom.

## 2015-05-22 ENCOUNTER — Encounter (HOSPITAL_COMMUNITY): Payer: Self-pay

## 2015-05-26 ENCOUNTER — Ambulatory Visit (INDEPENDENT_AMBULATORY_CARE_PROVIDER_SITE_OTHER): Payer: Medicare HMO | Admitting: Psychiatry

## 2015-05-26 DIAGNOSIS — F3162 Bipolar disorder, current episode mixed, moderate: Secondary | ICD-10-CM

## 2015-05-26 MED ORDER — ARIPIPRAZOLE ER 400 MG IM SUSR
400.0000 mg | INTRAMUSCULAR | Status: DC
  Administered 2015-05-26: 400 mg via INTRAMUSCULAR

## 2015-05-26 NOTE — Progress Notes (Signed)
Patient ID: Sean Hill, male   DOB: 03/16/1941, 74 y.o.   MRN: [id number] Patient presented with appropriate affect, level and pleasant mood but wearing an eye patch on his left eye today.  Patient stated he started getting double vision for an unknown cause, had an MRI that was clear and is scheduled to have a carotid scan on 05/27/15 to see if a blockage could be causing double vision. Patient questioned if could be due to Cogentin which he has taken a long time and never had symptoms of anticholinergic effect in the past. Stated his optometrist reviewed his list of medication too and Dr. Salem Senate consulted as agrees unlikely this sudden onset of double vision from current medications.  Patient to keep tests for tomorrow and to keep [place] informed.  States some disturbance in sleep due to eye concerns but denied any auditory or visual hallucinations and no problems with suicidal or homicidal ideations.  Patient's due Abilify Maintena 400mg  IM injection prepared as ordered and given to patient in his left upper outer gluteal quadrant area.  Patient tolerated injection without compliant of pain or discomfort and agreed to return in 30 days for next injection.  Patient to keep [place] informed of status with eye and to call if providers think may be related to any medications.  Patient to also call if notices any change in mood or problems with sleep or other symptoms.

## 2015-05-27 ENCOUNTER — Ambulatory Visit (HOSPITAL_COMMUNITY)
Admission: RE | Admit: 2015-05-27 | Discharge: 2015-05-27 | Disposition: A | Discharge status: Home / Self Care | Payer: Medicare HMO | Source: Ambulatory Visit | Attending: Adult Health | Admitting: Adult Health

## 2015-05-27 DIAGNOSIS — E785 Hyperlipidemia, unspecified: Secondary | ICD-10-CM | POA: Insufficient documentation

## 2015-05-27 DIAGNOSIS — I10 Essential (primary) hypertension: Secondary | ICD-10-CM | POA: Insufficient documentation

## 2015-05-27 DIAGNOSIS — I6523 Occlusion and stenosis of bilateral carotid arteries: Secondary | ICD-10-CM | POA: Insufficient documentation

## 2015-05-27 DIAGNOSIS — H532 Diplopia: Secondary | ICD-10-CM | POA: Diagnosis not present

## 2015-05-28 ENCOUNTER — Telehealth: Payer: Self-pay | Admitting: Adult Health

## 2015-05-28 NOTE — Telephone Encounter (Signed)
Left VM to call back regarding results from Carotid ultra sound

## 2015-05-29 ENCOUNTER — Telehealth: Payer: Self-pay | Admitting: Adult Health

## 2015-05-29 NOTE — Telephone Encounter (Signed)
Spoke with Legrand Como on the phone.He is aware of his US carotid results. He endorses that he has periods of time where his vision has corrected itself. States " it seems to be getting better."

## 2015-06-24 ENCOUNTER — Ambulatory Visit (INDEPENDENT_AMBULATORY_CARE_PROVIDER_SITE_OTHER): Payer: Medicare HMO | Admitting: Psychiatry

## 2015-06-24 VITALS — BP 116/70 | HR 74 | Ht 70.0 in | Wt 256.8 lb

## 2015-06-24 DIAGNOSIS — F3162 Bipolar disorder, current episode mixed, moderate: Secondary | ICD-10-CM

## 2015-06-24 MED ORDER — ARIPIPRAZOLE ER 400 MG IM SUSR
400.0000 mg | INTRAMUSCULAR | Status: DC
  Administered 2015-06-24: 400 mg via INTRAMUSCULAR

## 2015-06-24 NOTE — Progress Notes (Signed)
Patient ID: Sean Hill, male   DOB: 11/27/1940, 74 y.o.   MRN: [id number] D. Patient presented with appropriate affect, level and pleasant mood and reported his eye was better and he was no longer getting periods of double vision.  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  Patient denied any suicidal or homicidal ideations and no auditory or visual hallucinations.  States no current symptoms or complaints.  A. Patient's due Abilify Maintena 400mg  IM injection prepared as ordered and given to patient in his right upper outer gluteal area.  R. Patient tolerated injection without complaint of pain or discomfort and agreed to return in one month for next due injection and in the coming week for next MD evaluation.  Discussed with patient [place] assistance program that could help patient apply for Abilify Maintena coverage and patient signed consents to send information to work with them on continued medication coverage.  Patient to call back if any problems and left stable and without any current concerns.

## 2015-07-09 ENCOUNTER — Ambulatory Visit (INDEPENDENT_AMBULATORY_CARE_PROVIDER_SITE_OTHER): Payer: Medicare HMO | Admitting: Psychiatry

## 2015-07-09 ENCOUNTER — Encounter (HOSPITAL_COMMUNITY): Payer: Self-pay | Admitting: Psychiatry

## 2015-07-09 VITALS — BP 128/77 | HR 80 | Ht 68.0 in | Wt 258.8 lb

## 2015-07-09 DIAGNOSIS — F319 Bipolar disorder, unspecified: Secondary | ICD-10-CM

## 2015-07-09 MED ORDER — ESCITALOPRAM OXALATE 20 MG PO TABS: 20.0000 mg | ORAL_TABLET | Freq: Every day | ORAL | Status: DC

## 2015-07-09 MED ORDER — BENZTROPINE MESYLATE 1 MG PO TABS: 1.0000 mg | ORAL_TABLET | Freq: Every day | ORAL | Status: DC

## 2015-07-09 MED ORDER — TEMAZEPAM 15 MG PO CAPS: 15.0000 mg | ORAL_CAPSULE | Freq: Every evening | ORAL | Status: DC | PRN

## 2015-07-09 NOTE — Progress Notes (Signed)
[place] [phone] Progress Note  Sean Hill [id number] 74 y.o.  07/09/2015 2:25 PM  Chief Complaint:  Medication management and followup.              History of Present Illness: Sean Hill came for his followup appointment.  He is taking his medication as prescribed.  Overall he described his mood stable.  Since we adjusted his trazodone he is no longer complaining of dizziness.  Usually he gets depressed and October but he is happy that his mood is stable.  He denies any irritability, suicidal thoughts, crying spells or any feeling of hopelessness or worthlessness.  He is seeing Sean Hill for counseling.  He is scheduled to see her tomorrow.  His energy level is good.  He is not involved in any self abusive behavior.  He sleeping good.  He denies any paranoia or any hallucination.  He wants to continue Cogentin, Lexapro, temazepam, trazodone and Abilify injection every 4 weeks.  Patient denies drinking or using any illegal substances.  He lives with his wife who is supportive.  Suicidal Ideation: No Plan Formed: No Patient has means to carry out plan: No  Homicidal Ideation: No Plan Formed: No Patient has means to carry out plan: No  Review of Systems  Constitutional: Negative for weight loss and malaise/fatigue.  Musculoskeletal: Negative.   Skin: Negative for itching and rash.  Neurological: Negative for dizziness, tremors and headaches.    Psychiatric: Agitation: No Hallucination: No Depressed Mood: No Insomnia: No Hypersomnia: No Altered Concentration: No Feels Worthless: No Grandiose Ideas: No Belief In Special Powers: No New/Increased Substance Abuse: No Compulsions: No  Neurologic: Headache: No Seizure: No Paresthesias: No  Medical History:  Patient has a history of high triglycerides, irritable bowel syndrome, chronic pain, GERD.  His primary care physician is Dr. Shawna Hill.  Psychosocial history.  Patient is born and raised in [place].  His been married once. He has 2 daughters. Patient lives with his wife. His daughter lives close by. He has good relationship with his daughter.  Past Psychiatric History/Hospitalization(s): Patient has at least 4-5 psychiatric hospitalization.  His last psychiatric admission was in July 2014 .  He has a history of taking overdose on his medication and mixing with alcohol. He has a history of cutting his wrist.  He has taken Tegretol, Depakote, Ambien, Remeron, Vistaril , Geodon, Abilify, lithium, Provigil, Zoloft, Neurontin , Lamictal, Wellbutrin , Risperdal and Prstiq.  He had overdose on Latuda. Patient endorsed history of mania and severe impulsive behavior. He admitted history of buying unnecessary, getting speeding tickets and aggression.  Patient reported history of side effects due to polypharmacy.   Anxiety: Yes Bipolar Disorder: Yes Depression: Yes Mania: Yes Psychosis: Yes Schizophrenia: No Personality Disorder: No Hospitalization for psychiatric illness: Yes History of Electroconvulsive Shock Therapy: No Prior Suicide Attempts: Yes  Physical Exam: Constitutional:  BP 128/77 mmHg  Pulse 80  Ht 5\' 8"  (1.727 m)  Wt 258 lb 12.8 oz (117.391 kg)  BMI 39.36 kg/m2   Recent Results (from the past 2160 hour(s))  I-stat chem 8, ed     Status: Abnormal   Collection Time: 05/07/15 11:26 PM  Result Value Ref Range   Sodium 139 135 - 145 mmol/L   Potassium 3.7 3.5 - 5.1 mmol/L   Chloride 107 101 - 111 mmol/L   BUN 10 6 - 20 mg/dL   Creatinine, Ser 1.00 0.61 - 1.24 mg/dL   Glucose, Bld 108 (H) 65 - 99 mg/dL  Calcium, Ion 1.13 1.13 - 1.30 mmol/L   TCO2 22 0 - 100 mmol/L   Hemoglobin 16.3 13.0 - 17.0 g/dL   HCT 48.0 39.0 - 52.0 %    General Appearance: well nourished and obese  Musculoskeletal: Strength & Muscle Tone: within normal limits Gait & Station: normal Patient leans: Patient has normal posture  Mental status examination Patient is casually dressed and fairly  groomed.  He is pleasant and cooperative.  He maintained good eye contact.  He described his mood good and his affect is okay. He denies any active or passive suicidal parts or homicidal thought.  There were no paranoia or delusions.  His psychomotor activity is normal.  His speech is slow but clear and coherent.  His fund of knowledge is good.  His thought process is slow but logical and goal directed.  He denies any auditory or visual hallucination.  There were no delusions or any paranoia.  His attention and concentration is fair.  He is alert and oriented x3.  His memory is good.  His insight judgment and impulse control is okay.  Established Problem, Stable/Improving (1), Review of Psycho-Social Stressors (1), Review or order clinical lab tests (1), Review of Last Therapy Session (1) and Review of Medication Regimen & Side Effects (2)  Assessment: Axis I: Bipolar disorder NOS  Axis II: Deferred  Axis III:  Patient Active Problem List   Diagnosis Date Noted  . Left leg swelling 01/06/2015  . Change in bowel habits 01/06/2015  . Gastroesophageal reflux disease 11/28/2011  . Bipolar disorder ([place]) 11/25/2011    Plan:  Patient is a stable on his current psychiatric medication.  He is no more complaining of dizziness.  He is seeing Sean Hill for counseling.  Discussed medication side effects and benefits.  I will continue Abilify injection 400 mg IM every 4 weeks, Cogentin 1 mg at bedtime, temazepam 15 mg at bedtime, trazodone 100 mg  half to one tablet at bedtime and Lexapro 20 mg daily. Follow-up in 3 months.  Sean Hill T., MD 07/09/2015

## 2015-07-16 ENCOUNTER — Other Ambulatory Visit: Payer: Self-pay | Admitting: Internal Medicine

## 2015-07-16 ENCOUNTER — Other Ambulatory Visit (HOSPITAL_COMMUNITY): Payer: Self-pay | Admitting: Psychiatry

## 2015-07-18 ENCOUNTER — Other Ambulatory Visit (HOSPITAL_COMMUNITY): Payer: Self-pay | Admitting: Psychiatry

## 2015-07-21 ENCOUNTER — Other Ambulatory Visit (HOSPITAL_COMMUNITY): Payer: Self-pay | Admitting: Psychiatry

## 2015-07-21 DIAGNOSIS — F319 Bipolar disorder, unspecified: Secondary | ICD-10-CM

## 2015-07-21 MED ORDER — TRAZODONE HCL 100 MG PO TABS: 100.0000 mg | ORAL_TABLET | Freq: Every day | ORAL | Status: DC

## 2015-07-22 ENCOUNTER — Ambulatory Visit (INDEPENDENT_AMBULATORY_CARE_PROVIDER_SITE_OTHER): Payer: Medicare HMO

## 2015-07-22 VITALS — BP 130/70 | HR 75 | Ht 68.0 in | Wt 260.0 lb

## 2015-07-22 DIAGNOSIS — F3131 Bipolar disorder, current episode depressed, mild: Secondary | ICD-10-CM

## 2015-07-22 DIAGNOSIS — F319 Bipolar disorder, unspecified: Secondary | ICD-10-CM

## 2015-07-22 MED ORDER — ARIPIPRAZOLE ER 400 MG IM SUSR
400.0000 mg | INTRAMUSCULAR | Status: DC
Start: 2015-07-22 — End: 2015-07-22
  Administered 2015-07-22: 400 mg via INTRAMUSCULAR

## 2015-07-22 MED ORDER — ARIPIPRAZOLE ER 400 MG IM SUSR: 400.0000 mg | INTRAMUSCULAR | Status: DC

## 2015-07-22 NOTE — Progress Notes (Signed)
Patient ID: Sean Hill, male   DOB: [birth year], 74 y.o.   MRN: [id number] D. Patient presented with appropriate affect, level and pleasant mood and denied any auditory or visual hallucinations, no suicidal or homicidal ideations, no problems with sleep or appetite and reported "i'm doing good".   Met with Dr. Adele Schilder to arrange to send a new Abilify Maintena 400 mg IM order to [name] who will be assisting patient to trying to get continued coverage of medication.  Patient agreed with this plan.  A. Patient's due Abilify Maintena 400 mg IM injection prepared and given to patient in his left upper outer gluteal quadrant.  R. Patient tolerated due injection without compliant of any pain or discomfort and agreed to call [name] and to have them follow up with him over the coming month for assistance with medication refill orders of Abilify Maintena.  Informed if medication covered the pharmacy agreed to deliver his medication to out office each month.  A new order for patient's Abilify Jodi Geralds was called into [name] with [name], pharmacist as it printed out by mistake and that order was voided.  Informed of patient's contact information as patient had previously signed a consent for them to contact him and to assist with trying to apply for continued coverage of Abilify Maintena medication.  Provided patient's current insurance information and requested the pharmacy call us back if anything else needed and to also call patient if needed to assist with trying to set up medication coverage.  Pharmacist agreed with plan and will see patient again in one month.

## 2015-07-23 ENCOUNTER — Ambulatory Visit (HOSPITAL_COMMUNITY): Payer: Self-pay

## 2015-08-24 ENCOUNTER — Encounter (HOSPITAL_COMMUNITY): Payer: Self-pay

## 2015-08-24 ENCOUNTER — Ambulatory Visit (INDEPENDENT_AMBULATORY_CARE_PROVIDER_SITE_OTHER): Payer: Medicare HMO

## 2015-08-24 VITALS — BP 136/74 | HR 85 | Ht 69.0 in | Wt 260.0 lb

## 2015-08-24 DIAGNOSIS — F319 Bipolar disorder, unspecified: Secondary | ICD-10-CM | POA: Diagnosis not present

## 2015-08-24 MED ORDER — ARIPIPRAZOLE ER 400 MG IM SUSR
400.0000 mg | INTRAMUSCULAR | Status: DC
Start: 2015-08-24 — End: 2015-08-24
  Administered 2015-08-24: 400 mg via INTRAMUSCULAR

## 2015-08-24 NOTE — Progress Notes (Signed)
Patient ID: Sean Hill, male   DOB: 1941/04/13, 74 y.o.   MRN: [id number] D. Patient presented with appropriate affect, level mood and denied any auditory or visual hallucinations and no suicidal or homicidal ideations.  Patient reported no problems with sleep or appetite and only some minimal depressive symptoms "around this time of year".  Patient reported no problems with medications and discussed concern patient's Abilify Jodi Geralds is not covered under his insurance and will cost more than $800 a month per report from pharmacist at [name].  A. Patient's due Abilify maintena 400 mg IM injection prepared as ordered and given to patient in his right upper outer gluteal area.  R. Patient tolerated injection without complaint of pain or discomfort and assisted patient with printing out application for [employer] for continued Abilify Maintena medication from [employer].  Patient agreed to complete the forms and to return to this nurse to assist with applying for continued monthly medication.  Patient agreed to also discuss with [place] his new insurance beginning in January to see if this change would cover his monthly medication.  Patient to call if any problems with symptoms over the coming month and will call to return once he as applications forms prepared to turn in.

## 2015-09-10 ENCOUNTER — Other Ambulatory Visit (HOSPITAL_COMMUNITY): Payer: Self-pay | Admitting: Psychiatry

## 2015-09-25 ENCOUNTER — Ambulatory Visit (INDEPENDENT_AMBULATORY_CARE_PROVIDER_SITE_OTHER): Payer: PPO

## 2015-09-25 VITALS — Ht 70.0 in | Wt 259.0 lb

## 2015-09-25 DIAGNOSIS — F319 Bipolar disorder, unspecified: Secondary | ICD-10-CM

## 2015-09-25 MED ORDER — ARIPIPRAZOLE ER 400 MG IM SUSR
400.0000 mg | INTRAMUSCULAR | Status: DC
  Administered 2015-09-25: 400 mg via INTRAMUSCULAR

## 2015-09-25 NOTE — Progress Notes (Signed)
Patient ID: Sean Hill, male   DOB: November 28, 1940, 75 y.o.   MRN: [id number] D. Patient presented with flat affect, level mood but stated he had been worried about getting approval for Abilify Maintena medication as states he cannot afford $900 per month for injection and it is not covered under his insurance.  Patient agreed to apply for patient assistance and [employer] paperwork provided for patient again to complete as informed we could not continue to supply samples.  Patient stated plan to complete paperwork and to return in the coming week.  Patient denied any auditory or visual hallucinations, no suicidal or homicidal ideations and reported he does not want to change medication if not necessary as reports doing much better with injection medication.  A. Patient's due Abilify Maintena medication prepared and administered to patient in his left upper outer gluteal area.  R. Patient tolerated injection without compliant of pain or discomfort and agreed to return in one month for next injection.  Patient also agreed to return paperwork for [employer] application for injection medication assistance in the coming week and stated plan to keep appointment for Dr.Arfeen on 10/12/15.

## 2015-10-01 ENCOUNTER — Telehealth (HOSPITAL_COMMUNITY): Payer: Self-pay

## 2015-10-01 NOTE — Telephone Encounter (Signed)
Telephone call with patient to follow up on need to finish completing [employer] application for Abilify Maintena.  Patient stated he had the form but did not want to do the application at this time due to he was not comfortable with diagnosis of paranoid schizophrenia.  States when he sees Dr. Adele Schilder this month he may request to change to po Abilify as thinks this may be covered by his insurance at a lower cost.  Patient to call back if changes his mind about application.

## 2015-10-06 ENCOUNTER — Other Ambulatory Visit (HOSPITAL_COMMUNITY): Payer: Self-pay | Admitting: Psychiatry

## 2015-10-07 ENCOUNTER — Other Ambulatory Visit (HOSPITAL_COMMUNITY): Payer: Self-pay | Admitting: Psychiatry

## 2015-10-12 ENCOUNTER — Other Ambulatory Visit (HOSPITAL_COMMUNITY): Payer: Self-pay | Admitting: Psychiatry

## 2015-10-12 ENCOUNTER — Ambulatory Visit (HOSPITAL_COMMUNITY): Payer: PPO

## 2015-10-12 ENCOUNTER — Ambulatory Visit (INDEPENDENT_AMBULATORY_CARE_PROVIDER_SITE_OTHER): Payer: PPO | Admitting: Psychiatry

## 2015-10-12 ENCOUNTER — Encounter (HOSPITAL_COMMUNITY): Payer: Self-pay | Admitting: Psychiatry

## 2015-10-12 VITALS — BP 150/77 | HR 84 | Ht 69.0 in | Wt 258.6 lb

## 2015-10-12 DIAGNOSIS — F3131 Bipolar disorder, current episode depressed, mild: Secondary | ICD-10-CM

## 2015-10-12 DIAGNOSIS — F319 Bipolar disorder, unspecified: Secondary | ICD-10-CM | POA: Diagnosis not present

## 2015-10-12 MED ORDER — ARIPIPRAZOLE ER 400 MG IM SUSR: 400.0000 mg | INTRAMUSCULAR | Status: DC

## 2015-10-12 MED ORDER — BENZTROPINE MESYLATE 1 MG PO TABS: 1.0000 mg | ORAL_TABLET | Freq: Every day | ORAL | Status: DC

## 2015-10-12 MED ORDER — ESCITALOPRAM OXALATE 20 MG PO TABS: 20.0000 mg | ORAL_TABLET | Freq: Every day | ORAL | Status: DC

## 2015-10-12 MED ORDER — TEMAZEPAM 15 MG PO CAPS: 15.0000 mg | ORAL_CAPSULE | Freq: Every evening | ORAL | Status: DC | PRN

## 2015-10-12 MED ORDER — TRAZODONE HCL 100 MG PO TABS: 100.0000 mg | ORAL_TABLET | Freq: Every day | ORAL | Status: DC

## 2015-10-12 NOTE — Progress Notes (Signed)
[facility] [phone] Progress Note  Sean Hill [id number] 75 y.o.  10/12/2015 10:21 AM  Chief Complaint:  Medication management and followup.              History of Present Illness: Sean Hill came for his followup appointment.  He had a good Christmas.  Usually around Christmas time he gets very anxious and depressed but this time he felt one of the best Christmas in recent years.  He sleeping good.  He denies any hallucination, paranoia, irritability, mania or any depressive symptoms.  He is taking his medication as prescribed.  He denies any feeling of hopelessness or worthlessness.  He continues to see [name] for counseling.  However he had not seen every week lately.  He is still has dry mouth but less intense feeling better.  He wants to continue Cogentin, Lexapro, temazepam, Abilify and trazodone.  Patient denies drinking or using any illegal substances.  His appetite is okay.  His vitals are stable.  Patient lives with his wife is very supportive.  He received a letter stating that his Cogentin is not approved from [employer].  I explained that it is not expensive and can be available $4 at [facility] without insurance.  Patient agreed to get from pharmacy at [facility].  Suicidal Ideation: No Plan Formed: No Patient has means to carry out plan: No  Homicidal Ideation: No Plan Formed: No Patient has means to carry out plan: No  Review of Systems  Constitutional: Negative for weight loss and malaise/fatigue.  Musculoskeletal: Negative.   Skin: Negative for itching and rash.  Neurological: Negative for dizziness, tremors and headaches.    Psychiatric: Agitation: No Hallucination: No Depressed Mood: No Insomnia: No Hypersomnia: No Altered Concentration: No Feels Worthless: No Grandiose Ideas: No Belief In Special Powers: No New/Increased Substance Abuse: No Compulsions: No  Neurologic: Headache: No Seizure: No Paresthesias: No  Medical History:   Patient has a history of high triglycerides, irritable bowel syndrome, chronic pain, GERD.  His primary care physician is Dr. Shawna Orleans.  Psychosocial history.  Patient is born and raised in [place]. His been married once. He has 2 daughters. Patient lives with his wife. His daughter lives close by. He has good relationship with his daughter.  Past Psychiatric History/Hospitalization(s): Patient has at least 4-5 psychiatric hospitalization.  His last psychiatric admission was in July 2014 .  He has a history of taking overdose on his medication and mixing with alcohol. He has a history of cutting his wrist.  He has taken Tegretol, Depakote, Ambien, Remeron, Vistaril , Geodon, Abilify, lithium, Provigil, Zoloft, Neurontin , Lamictal, Wellbutrin , Risperdal and Prstiq.  He had overdose on Latuda. Patient endorsed history of mania and severe impulsive behavior. He admitted history of buying unnecessary, getting speeding tickets and aggression.  Patient reported history of side effects due to polypharmacy.   Anxiety: Yes Bipolar Disorder: Yes Depression: Yes Mania: Yes Psychosis: Yes Schizophrenia: No Personality Disorder: No Hospitalization for psychiatric illness: Yes History of Electroconvulsive Shock Therapy: No Prior Suicide Attempts: Yes  Physical Exam: Constitutional:  BP 150/77 mmHg  Pulse 84  Ht 5\' 9"  (1.753 m)  Wt 258 lb 9.6 oz (117.3 kg)  BMI 38.17 kg/m2   No results found for this or any previous visit (from the past 2160 hour(s)).  General Appearance: well nourished and obese  Musculoskeletal: Strength & Muscle Tone: within normal limits Gait & Station: normal Patient leans: Patient has normal posture  Mental status examination Patient  is casually dressed and fairly groomed.  He is pleasant and cooperative.  He maintained good eye contact.  He described his mood good and his affect is okay. He denies any active or passive suicidal parts or homicidal thought.   There were no paranoia or delusions.  His psychomotor activity is normal.  His speech is slow but clear and coherent.  His fund of knowledge is good.  His thought process is slow but logical and goal directed.  He denies any auditory or visual hallucination.  There were no delusions or any paranoia.  His attention and concentration is fair.  He is alert and oriented x3.  His memory is good.  His insight judgment and impulse control is okay.  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 Left leg swelling 01/06/2015  . Change in bowel habits 01/06/2015  . Gastroesophageal reflux disease 11/28/2011  . Bipolar disorder ([place]) 11/25/2011    Plan:  Patient is a stable on his current psychiatric medication.  He is concerned about his insurance which does not cover Cogentin however when I ask that it is only $4 at local pharmacy without insurance he was relieved and agreed to continue Cogentin.  Discussed medication side effects and benefits.  Recommended to see [name] for counseling.  I will continue Abilify injection 400 mg IM every 4 weeks, Cogentin 1 mg at bedtime, temazepam 15 mg at bedtime, trazodone 100 mg  half to one tablet at bedtime and Lexapro 20 mg daily. Follow-up in 3 months.  Kasch Borquez T., MD 10/12/2015

## 2015-10-13 DIAGNOSIS — F411 Generalized anxiety disorder: Secondary | ICD-10-CM | POA: Diagnosis not present

## 2015-10-13 DIAGNOSIS — F319 Bipolar disorder, unspecified: Secondary | ICD-10-CM | POA: Diagnosis not present

## 2015-10-26 ENCOUNTER — Ambulatory Visit (INDEPENDENT_AMBULATORY_CARE_PROVIDER_SITE_OTHER): Payer: PPO

## 2015-10-26 VITALS — BP 136/78 | HR 75 | Ht 69.0 in | Wt 261.2 lb

## 2015-10-26 DIAGNOSIS — F319 Bipolar disorder, unspecified: Secondary | ICD-10-CM | POA: Diagnosis not present

## 2015-10-26 MED ORDER — ARIPIPRAZOLE ER 400 MG IM SUSR
400.0000 mg | INTRAMUSCULAR | Status: DC
  Administered 2015-10-26: 400 mg via INTRAMUSCULAR

## 2015-10-26 NOTE — Progress Notes (Signed)
Patient ID: Sean Hill, male   DOB: 1941-08-19, 75 y.o.   MRN: [id number] Patient presented with flat affect, level mood but admitted he had been more anxious recently due to being worried about continued coverage for Abilify Maintena.  Patient applying currently for patient assistance and informed patient our facility would continue to assist at present with samples until a decision is reached per Dr. Adele Schilder.  Patient denied any auditory or visual hallucinations, no suicidal or homicidal ideations and reported no current problems with mood or appetite.  Patient received due Abilify Maintena 400 mg IM injection from Dennie Maizes, CMA as ordered and agreed to return in 1 month for next due injection. Patient to call as needed.

## 2015-10-26 NOTE — Progress Notes (Signed)
Patient ID: Sean Hill, male   DOB: [birth year], 75 y.o.   MRN: [id number] Patient presents for injection of Abilify maintena 400mg  IM Injection. Injection prepared as ordered and givin in right upper outer gluteal quadrant. Patient tolerated without pain or discomfort, agrees to return in 30 days for next injection. Patient will be sending in application for medication assistance this month.

## 2015-10-29 ENCOUNTER — Ambulatory Visit (HOSPITAL_BASED_OUTPATIENT_CLINIC_OR_DEPARTMENT_OTHER)
Admission: AD | Admit: 2015-10-29 | Discharge: 2015-10-29 | Disposition: A | Discharge status: Home / Self Care | Payer: PPO | Attending: Orthopedic Surgery | Admitting: Orthopedic Surgery

## 2015-10-29 ENCOUNTER — Ambulatory Visit (HOSPITAL_BASED_OUTPATIENT_CLINIC_OR_DEPARTMENT_OTHER): Admission: AD | Admit: 2015-10-29 | Payer: Self-pay | Admitting: Orthopedic Surgery

## 2015-10-29 ENCOUNTER — Encounter (HOSPITAL_BASED_OUTPATIENT_CLINIC_OR_DEPARTMENT_OTHER): Admission: AD | Disposition: A | Discharge status: Home / Self Care | Payer: Self-pay | Source: Home / Self Care

## 2015-10-29 ENCOUNTER — Ambulatory Visit (HOSPITAL_BASED_OUTPATIENT_CLINIC_OR_DEPARTMENT_OTHER): Payer: PPO | Admitting: Certified Registered"

## 2015-10-29 ENCOUNTER — Emergency Department (HOSPITAL_COMMUNITY): Payer: PPO

## 2015-10-29 ENCOUNTER — Encounter (HOSPITAL_COMMUNITY): Payer: Self-pay

## 2015-10-29 DIAGNOSIS — S52611B Displaced fracture of right ulna styloid process, initial encounter for open fracture type I or II: Secondary | ICD-10-CM | POA: Insufficient documentation

## 2015-10-29 DIAGNOSIS — F319 Bipolar disorder, unspecified: Secondary | ICD-10-CM | POA: Insufficient documentation

## 2015-10-29 DIAGNOSIS — S52591A Other fractures of lower end of right radius, initial encounter for closed fracture: Secondary | ICD-10-CM | POA: Diagnosis not present

## 2015-10-29 DIAGNOSIS — M25562 Pain in left knee: Secondary | ICD-10-CM | POA: Diagnosis not present

## 2015-10-29 DIAGNOSIS — S53104A Unspecified dislocation of right ulnohumeral joint, initial encounter: Secondary | ICD-10-CM | POA: Diagnosis not present

## 2015-10-29 DIAGNOSIS — G4730 Sleep apnea, unspecified: Secondary | ICD-10-CM | POA: Insufficient documentation

## 2015-10-29 DIAGNOSIS — Z79899 Other long term (current) drug therapy: Secondary | ICD-10-CM | POA: Diagnosis not present

## 2015-10-29 DIAGNOSIS — K219 Gastro-esophageal reflux disease without esophagitis: Secondary | ICD-10-CM | POA: Insufficient documentation

## 2015-10-29 DIAGNOSIS — I10 Essential (primary) hypertension: Secondary | ICD-10-CM | POA: Insufficient documentation

## 2015-10-29 DIAGNOSIS — I447 Left bundle-branch block, unspecified: Secondary | ICD-10-CM

## 2015-10-29 DIAGNOSIS — J45909 Unspecified asthma, uncomplicated: Secondary | ICD-10-CM | POA: Insufficient documentation

## 2015-10-29 DIAGNOSIS — S6991XA Unspecified injury of right wrist, hand and finger(s), initial encounter: Secondary | ICD-10-CM | POA: Diagnosis not present

## 2015-10-29 DIAGNOSIS — Y92008 Other place in unspecified non-institutional (private) residence as the place of occurrence of the external cause: Secondary | ICD-10-CM | POA: Diagnosis not present

## 2015-10-29 DIAGNOSIS — S52571A Other intraarticular fracture of lower end of right radius, initial encounter for closed fracture: Secondary | ICD-10-CM | POA: Diagnosis not present

## 2015-10-29 DIAGNOSIS — E785 Hyperlipidemia, unspecified: Secondary | ICD-10-CM | POA: Insufficient documentation

## 2015-10-29 DIAGNOSIS — Z96653 Presence of artificial knee joint, bilateral: Secondary | ICD-10-CM | POA: Diagnosis not present

## 2015-10-29 DIAGNOSIS — W19XXXA Unspecified fall, initial encounter: Secondary | ICD-10-CM

## 2015-10-29 DIAGNOSIS — Z7982 Long term (current) use of aspirin: Secondary | ICD-10-CM | POA: Diagnosis not present

## 2015-10-29 DIAGNOSIS — S52501B Unspecified fracture of the lower end of right radius, initial encounter for open fracture type I or II: Secondary | ICD-10-CM

## 2015-10-29 DIAGNOSIS — S59201A Unspecified physeal fracture of lower end of radius, right arm, initial encounter for closed fracture: Secondary | ICD-10-CM | POA: Diagnosis not present

## 2015-10-29 DIAGNOSIS — M79631 Pain in right forearm: Secondary | ICD-10-CM | POA: Diagnosis not present

## 2015-10-29 DIAGNOSIS — S52501A Unspecified fracture of the lower end of right radius, initial encounter for closed fracture: Secondary | ICD-10-CM | POA: Diagnosis not present

## 2015-10-29 DIAGNOSIS — S53124A Posterior dislocation of right ulnohumeral joint, initial encounter: Secondary | ICD-10-CM | POA: Diagnosis not present

## 2015-10-29 DIAGNOSIS — G8918 Other acute postprocedural pain: Secondary | ICD-10-CM | POA: Diagnosis not present

## 2015-10-29 DIAGNOSIS — W108XXA Fall (on) (from) other stairs and steps, initial encounter: Secondary | ICD-10-CM | POA: Diagnosis not present

## 2015-10-29 HISTORY — DX: Left bundle-branch block, unspecified: I44.7

## 2015-10-29 HISTORY — PX: OPEN REDUCTION INTERNAL FIXATION (ORIF) DISTAL RADIAL FRACTURE: SHX5989

## 2015-10-29 LAB — CBC WITH DIFFERENTIAL/PLATELET
BASOS PCT: 1 %
Basophils Absolute: 0.1 10*3/uL (ref 0.0–0.1)
EOS ABS: 0.1 10*3/uL (ref 0.0–0.7)
EOS PCT: 2 %
HCT: 45.2 % (ref 39.0–52.0)
HEMOGLOBIN: 15.6 g/dL (ref 13.0–17.0)
Lymphocytes Relative: 44 %
Lymphs Abs: 2.2 10*3/uL (ref 0.7–4.0)
MCH: 29.3 pg (ref 26.0–34.0)
MCHC: 34.5 g/dL (ref 30.0–36.0)
MCV: 85 fL (ref 78.0–100.0)
MONOS PCT: 8 %
Monocytes Absolute: 0.4 10*3/uL (ref 0.1–1.0)
NEUTROS PCT: 45 %
Neutro Abs: 2.2 10*3/uL (ref 1.7–7.7)
PLATELETS: 197 10*3/uL (ref 150–400)
RBC: 5.32 MIL/uL (ref 4.22–5.81)
RDW: 14.2 % (ref 11.5–15.5)
WBC: 4.9 10*3/uL (ref 4.0–10.5)

## 2015-10-29 LAB — I-STAT CHEM 8, ED
BUN: 14 mg/dL (ref 6–20)
CALCIUM ION: 1.15 mmol/L (ref 1.13–1.30)
Chloride: 105 mmol/L (ref 101–111)
Creatinine, Ser: 0.9 mg/dL (ref 0.61–1.24)
GLUCOSE: 165 mg/dL — AB (ref 65–99)
HCT: 48 % (ref 39.0–52.0)
Hemoglobin: 16.3 g/dL (ref 13.0–17.0)
Potassium: 3.7 mmol/L (ref 3.5–5.1)
SODIUM: 141 mmol/L (ref 135–145)
TCO2: 24 mmol/L (ref 0–100)

## 2015-10-29 SURGERY — OPEN REDUCTION INTERNAL FIXATION (ORIF) DISTAL RADIUS FRACTURE
Anesthesia: Regional | Site: Arm Lower | Laterality: Right

## 2015-10-29 MED ORDER — ONDANSETRON HCL 4 MG/2ML IJ SOLN
INTRAMUSCULAR | Status: AC
  Filled 2015-10-29: qty 2

## 2015-10-29 MED ORDER — BUPIVACAINE HCL (PF) 0.25 % IJ SOLN
INTRAMUSCULAR | Status: AC
  Filled 2015-10-29: qty 30

## 2015-10-29 MED ORDER — DEXAMETHASONE SODIUM PHOSPHATE 10 MG/ML IJ SOLN
INTRAMUSCULAR | Status: AC
  Filled 2015-10-29: qty 1

## 2015-10-29 MED ORDER — FENTANYL CITRATE (PF) 100 MCG/2ML IJ SOLN
INTRAMUSCULAR | Status: AC
  Filled 2015-10-29: qty 2

## 2015-10-29 MED ORDER — ONDANSETRON HCL 4 MG/2ML IJ SOLN
4.0000 mg | Freq: Once | INTRAMUSCULAR | Status: AC
  Administered 2015-10-29: 4 mg via INTRAVENOUS
  Filled 2015-10-29: qty 2

## 2015-10-29 MED ORDER — 0.9 % SODIUM CHLORIDE (POUR BTL) OPTIME
TOPICAL | Status: DC | PRN
  Administered 2015-10-29: 400 mL

## 2015-10-29 MED ORDER — LACTATED RINGERS IV SOLN
INTRAVENOUS | Status: DC
  Administered 2015-10-29: 13:00:00 via INTRAVENOUS

## 2015-10-29 MED ORDER — 0.9 % SODIUM CHLORIDE (POUR BTL) OPTIME: TOPICAL | Status: DC | PRN

## 2015-10-29 MED ORDER — BUPIVACAINE HCL (PF) 0.25 % IJ SOLN: INTRAMUSCULAR | Status: DC | PRN

## 2015-10-29 MED ORDER — CEFAZOLIN SODIUM 1-5 GM-% IV SOLN
1.0000 g | Freq: Once | INTRAVENOUS | Status: AC
  Administered 2015-10-29: 1 g via INTRAVENOUS
  Filled 2015-10-29: qty 50

## 2015-10-29 MED ORDER — PROMETHAZINE HCL 25 MG/ML IJ SOLN: 6.2500 mg | INTRAMUSCULAR | Status: DC | PRN

## 2015-10-29 MED ORDER — GLYCOPYRROLATE 0.2 MG/ML IJ SOLN: 0.2000 mg | Freq: Once | INTRAMUSCULAR | Status: DC | PRN

## 2015-10-29 MED ORDER — ONDANSETRON HCL 4 MG/2ML IJ SOLN
INTRAMUSCULAR | Status: DC | PRN
  Administered 2015-10-29: 4 mg via INTRAVENOUS

## 2015-10-29 MED ORDER — SCOPOLAMINE 1 MG/3DAYS TD PT72: 1.0000 | MEDICATED_PATCH | Freq: Once | TRANSDERMAL | Status: DC | PRN

## 2015-10-29 MED ORDER — MORPHINE SULFATE (PF) 4 MG/ML IV SOLN
4.0000 mg | Freq: Once | INTRAVENOUS | Status: AC
  Administered 2015-10-29: 4 mg via INTRAVENOUS
  Filled 2015-10-29: qty 1

## 2015-10-29 MED ORDER — OXYCODONE HCL 5 MG PO TABS
ORAL_TABLET | ORAL | Status: AC
  Filled 2015-10-29: qty 1

## 2015-10-29 MED ORDER — CEFAZOLIN SODIUM-DEXTROSE 2-3 GM-% IV SOLR
INTRAVENOUS | Status: AC
  Filled 2015-10-29: qty 50

## 2015-10-29 MED ORDER — KETOROLAC TROMETHAMINE 30 MG/ML IJ SOLN
30.0000 mg | Freq: Once | INTRAMUSCULAR | Status: AC | PRN
  Administered 2015-10-29: 30 mg via INTRAVENOUS

## 2015-10-29 MED ORDER — CEFAZOLIN SODIUM-DEXTROSE 2-3 GM-% IV SOLR
INTRAVENOUS | Status: DC | PRN
  Administered 2015-10-29: 2 g via INTRAVENOUS

## 2015-10-29 MED ORDER — HYDROMORPHONE HCL 1 MG/ML IJ SOLN: 0.2500 mg | INTRAMUSCULAR | Status: DC | PRN

## 2015-10-29 MED ORDER — DEXAMETHASONE SODIUM PHOSPHATE 10 MG/ML IJ SOLN
INTRAMUSCULAR | Status: DC | PRN
  Administered 2015-10-29: 10 mg via INTRAVENOUS

## 2015-10-29 MED ORDER — TETANUS-DIPHTH-ACELL PERTUSSIS 5-2.5-18.5 LF-MCG/0.5 IM SUSP
0.5000 mL | Freq: Once | INTRAMUSCULAR | Status: AC
  Administered 2015-10-29: 0.5 mL via INTRAMUSCULAR
  Filled 2015-10-29: qty 0.5

## 2015-10-29 MED ORDER — LIDOCAINE HCL (CARDIAC) 20 MG/ML IV SOLN
INTRAVENOUS | Status: DC | PRN
  Administered 2015-10-29: 60 mg via INTRAVENOUS

## 2015-10-29 MED ORDER — OXYCODONE-ACETAMINOPHEN 5-325 MG PO TABS: ORAL_TABLET | ORAL | Status: DC

## 2015-10-29 MED ORDER — MIDAZOLAM HCL 2 MG/2ML IJ SOLN
1.0000 mg | INTRAMUSCULAR | Status: DC | PRN
  Administered 2015-10-29: 2 mg via INTRAVENOUS

## 2015-10-29 MED ORDER — SUCCINYLCHOLINE CHLORIDE 20 MG/ML IJ SOLN
INTRAMUSCULAR | Status: DC | PRN
  Administered 2015-10-29 (×2): 100 mg via INTRAVENOUS

## 2015-10-29 MED ORDER — PROPOFOL 10 MG/ML IV BOLUS
INTRAVENOUS | Status: DC | PRN
  Administered 2015-10-29: 200 mg via INTRAVENOUS

## 2015-10-29 MED ORDER — FENTANYL CITRATE (PF) 100 MCG/2ML IJ SOLN
50.0000 ug | INTRAMUSCULAR | Status: DC | PRN
  Administered 2015-10-29: 50 ug via INTRAVENOUS

## 2015-10-29 MED ORDER — MIDAZOLAM HCL 2 MG/2ML IJ SOLN
INTRAMUSCULAR | Status: AC
  Filled 2015-10-29: qty 2

## 2015-10-29 MED ORDER — OXYCODONE HCL 5 MG PO TABS
5.0000 mg | ORAL_TABLET | Freq: Once | ORAL | Status: AC | PRN
  Administered 2015-10-29: 5 mg via ORAL

## 2015-10-29 MED ORDER — KETOROLAC TROMETHAMINE 30 MG/ML IJ SOLN
INTRAMUSCULAR | Status: AC
  Filled 2015-10-29: qty 1

## 2015-10-29 MED ORDER — SULFAMETHOXAZOLE-TRIMETHOPRIM 800-160 MG PO TABS: 1.0000 | ORAL_TABLET | Freq: Two times a day (BID) | ORAL | Status: DC

## 2015-10-29 SURGICAL SUPPLY — 79 items
BANDAGE ACE 3X5.8 VEL STRL LF (GAUZE/BANDAGES/DRESSINGS) ×2 IMPLANT
BIT DRILL 2.0 LNG QUCK RELEASE (BIT) IMPLANT
BIT DRILL 2.8X5 QR DISP (BIT) ×1 IMPLANT
BLADE SURG 15 STRL LF DISP TIS (BLADE) ×3 IMPLANT
BLADE SURG 15 STRL SS (BLADE) ×6
BNDG CMPR 9X4 STRL LF SNTH (GAUZE/BANDAGES/DRESSINGS) ×1
BNDG ESMARK 4X9 LF (GAUZE/BANDAGES/DRESSINGS) ×2 IMPLANT
BNDG GAUZE ELAST 4 BULKY (GAUZE/BANDAGES/DRESSINGS) ×2 IMPLANT
BNDG PLASTER X FAST 3X3 WHT LF (CAST SUPPLIES) IMPLANT
BNDG PLASTER X FAST 5X5 WHT LF (CAST SUPPLIES) ×10 IMPLANT
BNDG PLSTR 5X5 XFST ST WHT LF (CAST SUPPLIES) ×10
BNDG PLSTR 9X3 FST ST WHT (CAST SUPPLIES)
CHLORAPREP W/TINT 26ML (MISCELLANEOUS) ×2 IMPLANT
CORDS BIPOLAR (ELECTRODE) ×2 IMPLANT
COVER BACK TABLE 60X90IN (DRAPES) ×2 IMPLANT
COVER MAYO STAND STRL (DRAPES) ×2 IMPLANT
DRAPE EXTREMITY T 121X128X90 (DRAPE) ×2 IMPLANT
DRAPE OEC MINIVIEW 54X84 (DRAPES) ×2 IMPLANT
DRAPE SURG 17X23 STRL (DRAPES) ×2 IMPLANT
DRILL 2.0 LNG QUICK RELEASE (BIT) ×2
GAUZE SPONGE 4X4 12PLY STRL (GAUZE/BANDAGES/DRESSINGS) ×2 IMPLANT
GAUZE XEROFORM 1X8 LF (GAUZE/BANDAGES/DRESSINGS) ×2 IMPLANT
GLOVE BIO SURGEON STRL SZ7 (GLOVE) ×2 IMPLANT
GLOVE BIO SURGEON STRL SZ7.5 (GLOVE) ×2 IMPLANT
GLOVE BIOGEL M STRL SZ7.5 (GLOVE) ×2 IMPLANT
GLOVE BIOGEL PI IND STRL 6.5 (GLOVE) ×1 IMPLANT
GLOVE BIOGEL PI IND STRL 8 (GLOVE) ×2 IMPLANT
GLOVE BIOGEL PI IND STRL 8.5 (GLOVE) ×1 IMPLANT
GLOVE BIOGEL PI INDICATOR 6.5 (GLOVE) ×1
GLOVE BIOGEL PI INDICATOR 8 (GLOVE) ×2
GLOVE BIOGEL PI INDICATOR 8.5 (GLOVE) ×1
GLOVE SURG ORTHO 8.0 STRL STRW (GLOVE) ×1 IMPLANT
GOWN STRL REUS W/ TWL LRG LVL3 (GOWN DISPOSABLE) ×1 IMPLANT
GOWN STRL REUS W/ TWL XL LVL3 (GOWN DISPOSABLE) IMPLANT
GOWN STRL REUS W/TWL LRG LVL3 (GOWN DISPOSABLE)
GOWN STRL REUS W/TWL XL LVL3 (GOWN DISPOSABLE) ×5 IMPLANT
GUIDEWIRE ORTHO 0.054X6 (WIRE) ×6 IMPLANT
NDL HYPO 25X1 1.5 SAFETY (NEEDLE) IMPLANT
NEEDLE HYPO 25X1 1.5 SAFETY (NEEDLE) ×2 IMPLANT
NS IRRIG 1000ML POUR BTL (IV SOLUTION) ×2 IMPLANT
PACK BASIN DAY SURGERY FS (CUSTOM PROCEDURE TRAY) ×2 IMPLANT
PAD CAST 3X4 CTTN HI CHSV (CAST SUPPLIES) ×1 IMPLANT
PADDING CAST ABS 4INX4YD NS (CAST SUPPLIES)
PADDING CAST ABS COTTON 4X4 ST (CAST SUPPLIES) ×1 IMPLANT
PADDING CAST COTTON 3X4 STRL (CAST SUPPLIES) ×2
PLATE STD RT ACULOC 2 (Plate) ×1 IMPLANT
SCREW BN FT 16X2.3XLCK HEX CRT (Screw) IMPLANT
SCREW CORTICAL LOCKING 2.3X14M (Screw) ×2 IMPLANT
SCREW CORTICAL LOCKING 2.3X16M (Screw) ×2 IMPLANT
SCREW CORTICAL LOCKING 2.3X20M (Screw) ×6 IMPLANT
SCREW CORTICAL LOCKING 2.3X22M (Screw) ×2 IMPLANT
SCREW CORTICAL LOCKING 2.3X24M (Screw) ×2 IMPLANT
SCREW FX20X2.3XSMTH LCK NS CRT (Screw) ×3 IMPLANT
SCREW FX22X2.3XLCK SMTH NS CRT (Screw) ×1 IMPLANT
SCREW FX24X2.3XLCK SMTH NS CRT (Screw) IMPLANT
SCREW HEX 3.5X15 NLCKG STRL (Screw) ×1 IMPLANT
SCREW HEX 3.5X15MM (Screw) ×2 IMPLANT
SCREW NLCKG 13 3.5X13 HEXA (Screw) IMPLANT
SCREW NON-LOCK 3.5X13 (Screw) ×2 IMPLANT
SCREW NONLOCK HEX 3.5X12 (Screw) ×1 IMPLANT
SLEEVE SCD COMPRESS KNEE MED (MISCELLANEOUS) ×2 IMPLANT
SPLINT PLASTER CAST XFAST 4X15 (CAST SUPPLIES) ×10 IMPLANT
SPLINT PLASTER XTRA FAST SET 4 (CAST SUPPLIES) ×10
STOCKINETTE 4X48 STRL (DRAPES) ×2 IMPLANT
SUCTION FRAZIER HANDLE 10FR (MISCELLANEOUS)
SUCTION TUBE FRAZIER 10FR DISP (MISCELLANEOUS) IMPLANT
SUT ETHILON 3 0 PS 1 (SUTURE) IMPLANT
SUT ETHILON 4 0 PS 2 18 (SUTURE) ×2 IMPLANT
SUT VIC AB 2-0 SH 27 (SUTURE)
SUT VIC AB 2-0 SH 27XBRD (SUTURE) IMPLANT
SUT VIC AB 3-0 PS1 18 (SUTURE)
SUT VIC AB 3-0 PS1 18XBRD (SUTURE) IMPLANT
SUT VICRYL 4-0 PS2 18IN ABS (SUTURE) ×4 IMPLANT
SYR BULB 3OZ (MISCELLANEOUS) ×2 IMPLANT
SYR CONTROL 10ML LL (SYRINGE) ×2 IMPLANT
TOWEL OR 17X24 6PK STRL BLUE (TOWEL DISPOSABLE) ×4 IMPLANT
TOWEL OR NON WOVEN STRL DISP B (DISPOSABLE) ×1 IMPLANT
TUBE CONNECTING 20X1/4 (TUBING) IMPLANT
UNDERPAD 30X30 (UNDERPADS AND DIAPERS) ×2 IMPLANT

## 2015-10-29 NOTE — ED Notes (Signed)
Patient will be transported to Day Surgery by his spouse with IV in place. Dr. Maryan Rued aware.

## 2015-10-29 NOTE — Op Note (Signed)
Intra-operative fluoroscopic images in the AP, lateral, and oblique views were taken and evaluated by myself.  Reduction and hardware placement were confirmed.  There was no intraarticular penetration of permanent hardware.  

## 2015-10-29 NOTE — ED Notes (Signed)
Patient transported to X-ray 

## 2015-10-29 NOTE — Transfer of Care (Signed)
Immediate Anesthesia Transfer of Care Note  Patient: Sean Hill  Procedure(s) Performed: Procedure(s): OPEN REDUCTION INTERNAL FIXATION (ORIF) DISTAL RADIAL FRACTURE (Right)  Patient Location: PACU  Anesthesia Type:GA combined with regional for post-op pain  Level of Consciousness: awake, alert , oriented and patient cooperative  Airway & Oxygen Therapy: Patient Spontanous Breathing and Patient connected to face mask oxygen  Post-op Assessment: Report given to RN, Post -op Vital signs reviewed and stable and Patient moving all extremities  Post vital signs: Reviewed and stable  Last Vitals:  Filed Vitals:   10/29/15 1350 10/29/15 1355  BP: 122/65 117/79  Pulse: 71 78  Temp:    Resp: 16 14    Complications: No apparent anesthesia complications

## 2015-10-29 NOTE — ED Provider Notes (Addendum)
CSN: [id number]     Arrival date & time 10/29/15  [id number] History   First MD Initiated Contact with Patient 10/29/15 [phone]     Chief Complaint  Patient presents with  . Fall  . Wrist Injury  . Knee Pain     (Consider location/radiation/quality/duration/timing/severity/associated sxs/prior Treatment) Patient is a 75 y.o. male presenting with fall, wrist injury, and knee pain. The history is provided by the patient and the spouse.  Fall This is a new (pt was carrying a heavy basket out of the house and tripped going down the steps and fell) problem. The current episode started less than 1 hour ago. The problem occurs constantly. The problem has not changed since onset.Associated symptoms comments: Right wrist pain and deformity.  Bilateral knee abrasions.  And abrasion to the face.  No LOC or head injury.  No numbness or weakness of the right hand.  No anticoagulation.. The symptoms are aggravated by bending and twisting. Relieved by: immobilization. The treatment provided no relief.  Wrist Injury Knee Pain   Past Medical History  Diagnosis Date  . Bipolar disorder [facility])     Has psychiatrist.  BH admission (suicidal) 07/20/11.  Marland Kitchen Hyperlipemia     Intol of meds: GI side effects  . 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  No hx of MRSA infection.  . 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 Nephrolithiasis   . GERD (gastroesophageal reflux disease)   . Inguinal hernia 02/25/11    Left sided hernia with fat  . Hip pain, left 2011-2012    Left hip and groin: MRI pelvis and left hip 02/18/11 showed NORMAL hip, with small inguinal hernia containing fat and sigmoid diverticulosis.  Hip pain presumably referred pain from hernia/diverticular dz??.  . Cervical spondylosis     Primarily C4-5 and C5-6--referred to [facility] 02/2011.  . Depression   . Bipolar disorder, current episode depressed, moderate ([place])   . Sleep apnea     . Asthma   . Overdose of benzodiazepine 04/30/2013    Status post  . Hypertension 02/23/15    Dr. Parke Poisson started Lisinopril   Past Surgical History  Procedure Laterality Date  . 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 Ankle fracture surgery      hardware still in both ankles (titanium)  . Inguinal hernia repair      Right side with mesh about 1993  . Elbow surgery      left---tendon  . Ankle surgery      Ankle tendon surgery  . Tonsillectomy    . Appendectomy     Family History  Problem Relation Age of Onset  . Alcohol abuse Mother   . Alcohol abuse Father   . Depression Daughter   . Paranoid behavior Daughter    Social History  Substance Use Topics  . Smoking status: Never Smoker   . Smokeless tobacco: None  . Alcohol Use: No     Comment: One drink rarely    Review of Systems  All other systems reviewed and are negative.     Allergies  Codeine and Penicillins  Home Medications   Prior to Admission medications   Medication Sig Start Date End Date Taking? Authorizing Provider  Apoaequorin (PREVAGEN PO) Take 1 tablet by mouth daily.    Historical Provider, MD  ARIPiprazole (ABILIFY MAINTENA) 400 MG SUSR Inject 400 mg  into the muscle every 30 (thirty) days. 10/12/15   [name], MD  aspirin 81 MG tablet Take 81 mg by mouth daily.    Historical Provider, MD  benztropine (COGENTIN) 1 MG tablet Take 1 tablet (1 mg total) by mouth daily. 10/12/15   [name], MD  escitalopram (LEXAPRO) 20 MG tablet Take 1 tablet (20 mg total) by mouth daily. 10/12/15   [name], MD  Flaxseed, Linseed, (FLAXSEED OIL PO) Take 1 tablet by mouth daily.    Historical Provider, MD  Multiple Vitamin (MULTIVITAMIN WITH MINERALS) TABS tablet Take 1 tablet by mouth daily.    Historical Provider, MD  omeprazole (PRILOSEC) 40 MG capsule TAKE ONE CAPSULE BY MOUTH TWICE DAILY 07/16/15   Doe-Hyun R Shawna Orleans, DO  oxymetazoline (AFRIN) 0.05 % nasal spray Place 1 spray into both  nostrils 2 (two) times daily.    Historical Provider, MD  temazepam (RESTORIL) 15 MG capsule Take 1 capsule (15 mg total) by mouth at bedtime as needed. for sleep 10/12/15   [name], MD  traZODone (DESYREL) 100 MG tablet Take 1 tablet (100 mg total) by mouth at bedtime. 10/12/15   [name], MD  Vitamin D, Cholecalciferol, 1000 UNITS TABS Take 1 tablet by mouth daily.    Historical Provider, MD   BP 103/55 mmHg  Pulse 66  Temp(Src) 98.1 F (36.7 C) (Oral)  Resp 17  SpO2 95% Physical Exam  Constitutional: He is oriented to person, place, and time. He appears well-developed and well-nourished. No distress.  HENT:  Head: Normocephalic and atraumatic.    Mouth/Throat: Oropharynx is clear and moist.  No orbital tenderness  Eyes: Conjunctivae and EOM are normal. Pupils are equal, round, and reactive to light.  Neck: Normal range of motion. Neck supple. No spinous process tenderness and no muscular tenderness present.  Cardiovascular: Normal rate, regular rhythm and intact distal pulses.   No murmur heard. Pulmonary/Chest: Effort normal and breath sounds normal. No respiratory distress. He has no wheezes. He has no rales.  Abdominal: Soft. He exhibits no distension. There is no tenderness. There is no rebound and no guarding.  Musculoskeletal: He exhibits tenderness. He exhibits no edema.       Right elbow: Normal.      Right wrist: He exhibits decreased range of motion, bony tenderness, swelling, deformity and laceration.       Right knee: Normal.       Left knee: He exhibits normal range of motion, no swelling, no effusion and no deformity. Tenderness found. Medial joint line and lateral joint line tenderness noted.       Arms:      Legs: 2+ radial pulse on the right.  Neurological: He is alert and oriented to person, place, and time.  Skin: Skin is warm and dry. No rash noted. No erythema.  Psychiatric: He has a normal mood and affect. His behavior is normal.  Nursing note and  vitals reviewed.   ED Course  Procedures (including critical care time) Labs Review Labs Reviewed  I-STAT CHEM 8, ED - Abnormal; Notable for the following:    Glucose, Bld 165 (*)    All other components within normal limits  CBC WITH DIFFERENTIAL/PLATELET    Imaging Review Dg Wrist Complete Right  10/29/2015  CLINICAL DATA:  Status post fall, right wrist pain EXAM: RIGHT WRIST - COMPLETE 3+ VIEW COMPARISON:  None. FINDINGS: There is a comminuted distal radial metaphysis fracture with articular surface involvement. There  is 17 mm of volar displacement. There is no radiocarpal dislocation. There is a nondisplaced fracture at the base of the ulnar styloid process. There is generalized osteopenia. 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 IMPRESSION: 1. Comminuted distal radial metaphysis fracture with articular surface involvement. There is 17 mm of volar displacement. 2. Nondisplaced fracture at the base of the ulnar styloid process. Electronically Signed   By: Kathreen Devoid   On: 10/29/2015 10:07   Dg Knee Complete 4 Views Left  10/29/2015  CLINICAL DATA:  Status post fall with left knee pain. EXAM: LEFT KNEE - COMPLETE 4+ VIEW COMPARISON:  None. FINDINGS: The left knee demonstrates a total knee arthroplasty without evidence of hardware failure complication. There is no significant joint effusion. There is no fracture or dislocation. The alignment is anatomic. IMPRESSION: No acute osseous injury of the left knee. Electronically Signed   By: Kathreen Devoid   On: 10/29/2015 10:04   I have personally reviewed and evaluated these images and lab results as part of my medical decision-making.   EKG Interpretation None      MDM   Final diagnoses:  Open fracture of distal end of radius, right, type I or II, initial encounter  Fall, initial encounter   patient is a 75 year old male with a mechanical fall down the steps today with injury to the right wrist and knees.  Patient was able to ambulate with only minimal pain in the knees but significant pain in the right wrist. On exam patient has obvious deformity and concern for an open fracture. 2+ radial pulse with normal finger sensation and movement. Patient only takes a baby aspirin but no other anticoagulation. He did eat at 7 AM this morning.  He denies any loss of consciousness or head injury just a scrape on his face.  Imaging of the wrist and knee pending. Patient given a tetanus shot and pain control.  10:24 AM The film is within normal limits. Wrist film shows a comminuted distal radial metaphyseal fracture with volar displacement and an ulnar styloid fracture. Consult to Dr. Fredna Dow  10:55 AM Pt will go by POV to the surgery day center.  He received anceph prior to transfer.  IV left in as pt going straight to day center.  Blanchie Dessert, MD 10/29/15 [place], MD 10/29/15 [place], MD 10/29/15 1058

## 2015-10-29 NOTE — Anesthesia Preprocedure Evaluation (Signed)
Anesthesia Evaluation  Patient identified by MRN, date of birth, ID band Patient awake    Reviewed: Allergy & Precautions, NPO status , Patient's Chart, lab work & pertinent test results  Airway Mallampati: II  TM Distance: <3 FB Neck ROM: Full    Dental no notable dental hx.    Pulmonary sleep apnea ,    Pulmonary exam normal breath sounds clear to auscultation       Cardiovascular hypertension, Pt. on medications Normal cardiovascular exam Rhythm:Regular Rate:Normal     Neuro/Psych Bipolar Disorder negative neurological ROS     GI/Hepatic negative GI ROS, Neg liver ROS,   Endo/Other  Morbid obesity  Renal/GU negative Renal ROS  negative genitourinary   Musculoskeletal negative musculoskeletal ROS (+)   Abdominal   Peds negative pediatric ROS (+)  Hematology negative hematology ROS (+)   Anesthesia Other Findings   Reproductive/Obstetrics negative OB ROS                             Anesthesia Physical Anesthesia Plan  ASA: III  Anesthesia Plan: General   Post-op Pain Management: GA combined w/ Regional for post-op pain   Induction: Intravenous  Airway Management Planned: Oral ETT  Additional Equipment:   Intra-op Plan:   Post-operative Plan: Extubation in OR  Informed Consent: I have reviewed the patients History and Physical, chart, labs and discussed the procedure including the risks, benefits and alternatives for the proposed anesthesia with the patient or authorized representative who has indicated his/her understanding and acceptance.   Dental advisory given  Plan Discussed with: CRNA  Anesthesia Plan Comments: (Ate at 0730)        Anesthesia Quick Evaluation

## 2015-10-29 NOTE — Brief Op Note (Signed)
10/29/2015  4:25 PM  PATIENT:  [name]  75 y.o. male  PRE-OPERATIVE DIAGNOSIS:  Right distal radius fracture, possible open ulna fracture  POST-OPERATIVE DIAGNOSIS:  Right comminuted intraarticular distal radius fracture, open ulna fracture, elbow dislocation  PROCEDURE:  Procedure(s): OPEN REDUCTION INTERNAL FIXATION (ORIF) DISTAL RADIAL FRACTURE (Right), irrigation and debridement open ulna fracture, closed reduction elbow dislocation  SURGEON:  Surgeon(s) and Role:    * Leanora Cover, MD - Primary  PHYSICIAN ASSISTANT:   ASSISTANTS: Daryll Brod, MD   ANESTHESIA:   regional and general  EBL:  Total I/O In: 1600 [I.V.:1600] Out: -   BLOOD ADMINISTERED:none  DRAINS: none   LOCAL MEDICATIONS USED:  NONE  SPECIMEN:  No Specimen  DISPOSITION OF SPECIMEN:  N/A  COUNTS:  YES  TOURNIQUET:   Total Tourniquet Time Documented: Upper Arm (Right) - 71 minutes Total: Upper Arm (Right) - 71 minutes   DICTATION: .Other Dictation: Dictation Number [id number]  PLAN OF CARE: Discharge to home after PACU  PATIENT DISPOSITION:  PACU - hemodynamically stable.

## 2015-10-29 NOTE — Op Note (Signed)
I assisted Surgeon(s) and Role:    * Leanora Cover, MD - Primary on the Procedure(s): OPEN REDUCTION INTERNAL FIXATION (ORIF) DISTAL RADIAL FRACTURE on 10/29/2015.  I provided assistance on this case as follows: retraction, debridement of the fracture, mobilization of fracture fragments, maintenance of reduction during plate fixation, radiographic interpretation, wound clusure, all of the wrist, plus reduction of the elbow disclocation and application of dressings and splints. I was present for the entire case.  Electronically signed by: Wynonia Sours, MD Date: 10/29/2015 Time: 4:35 PM

## 2015-10-29 NOTE — ED Notes (Signed)
Pt c/o R wrist injury, bilateral knee pain/abrasions, and R side facial pain/abrasions after a fall this morning.  Pain score 7/10.  Pt reports he was helping his wife when he stumbled and fell down 3-4 brick steps.  Unknown LOC.  Pt able to ambulate into department.  Hx of bilateral knee replacement.  Wrist deformity noted.

## 2015-10-29 NOTE — Anesthesia Procedure Notes (Addendum)
Procedure Name: Intubation Performed by: Baxter Flattery Pre-anesthesia Checklist: Patient identified, Emergency Drugs available, Suction available and Patient being monitored Patient Re-evaluated:Patient Re-evaluated prior to inductionOxygen Delivery Method: Circle System Utilized Preoxygenation: Pre-oxygenation with 100% oxygen Intubation Type: IV induction Ventilation: Mask ventilation without difficulty and Nasal airway inserted- appropriate to patient size Laryngoscope Size: Mac, 3, Glidescope and 4 (unable to visualize cord with MAC blade, easy intubation with Glide scope ) Grade View: Grade II Tube type: Oral Tube size: 8.0 mm Number of attempts: 2 (full beard, missing teeth, large tongue) Airway Equipment and Method: Stylet,  Oral airway and Video-laryngoscopy Placement Confirmation: ETT inserted through vocal cords under direct vision,  positive ETCO2 and breath sounds checked- equal and bilateral Secured at: 23 cm Tube secured with: Tape Dental Injury: Teeth and Oropharynx as per pre-operative assessment    Anesthesia Regional Block:  Supraclavicular block  Pre-Anesthetic Checklist: ,, timeout performed, Correct Patient, Correct Site, Correct Laterality, Correct Procedure, Correct Position, site marked, Risks and benefits discussed,  Surgical consent,  Pre-op evaluation,  At surgeon's request and post-op pain management  Laterality: Right  Prep: chloraprep       Needles:  Injection technique: Single-shot  Needle Type: Echogenic Stimulator Needle     Needle Length: 9cm 9 cm Needle Gauge: 21 and 21 G    Additional Needles:  Procedures: ultrasound guided (picture in chart) Supraclavicular block Narrative:  Injection made incrementally with aspirations every 5 mL.  Performed by: Personally  Anesthesiologist: Rod Mae  Additional Notes: Risks, benefits and alternative to block explained extensively.  Patient tolerated procedure well, without  complications.

## 2015-10-29 NOTE — H&P (Signed)
Sean Hill is an 75 y.o. male.   Chief Complaint: right distal radius fracture HPI: 75 yo male present with wife states he fell from a standing height this morning injuring right wrist.  Seen at [facility] where XR revealed right distal radius and ulnar styloid fractures.  Given IV Ancef at ED for what was felt to be open fracture at ulnar side of wrist.  Sent to [facility] for further care.  He reports no previous injury to right arm and no other injury at this time other than scrapes on knees and forehead.  Allergies:  Allergies  Allergen Reactions  . Codeine Shortness Of Breath  . Penicillins Hives, Rash and Other (See Comments)    Watery blisters Has patient had a PCN reaction causing immediate rash, facial/tongue/throat swelling, SOB or lightheadedness with hypotension: No Has patient had a PCN reaction causing severe rash involving mucus membranes or skin necrosis: No Has patient had a PCN reaction that required hospitalization No Has patient had a PCN reaction occurring within the last 10 years: No If all of the above answers are "NO", then may proceed with Cephalosporin use.     Past Medical History  Diagnosis Date  . Bipolar disorder [facility])     Has psychiatrist.  BH admission (suicidal) 07/20/11.  Marland Kitchen Hyperlipemia     Intol of meds: GI side effects  . 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  No hx of MRSA infection.  . 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 Nephrolithiasis   . GERD (gastroesophageal reflux disease)   . Inguinal hernia 02/25/11    Left sided hernia with fat  . Hip pain, left 2011-2012    Left hip and groin: MRI pelvis and left hip 02/18/11 showed NORMAL hip, with small inguinal hernia containing fat and sigmoid diverticulosis.  Hip pain presumably referred pain from hernia/diverticular dz??.  . Cervical spondylosis     Primarily C4-5 and C5-6--referred to [facility] 02/2011.  .  Depression   . Bipolar disorder, current episode depressed, moderate ([place])   . Sleep apnea   . Asthma   . Overdose of benzodiazepine 04/30/2013    Status post  . Hypertension 02/23/15    Dr. Parke Poisson started Lisinopril    Past Surgical History  Procedure Laterality Date  . 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 Ankle fracture surgery      hardware still in both ankles (titanium)  . Inguinal hernia repair      Right side with mesh about 1993  . Elbow surgery      left---tendon  . Ankle surgery      Ankle tendon surgery  . Tonsillectomy    . Appendectomy      Family History: Family History  Problem Relation Age of Onset  . Alcohol abuse Mother   . Alcohol abuse Father   . Depression Daughter   . Paranoid behavior Daughter     Social History:   reports that he has never smoked. He does not have any smokeless tobacco history on file. He reports that he does not drink alcohol or use illicit drugs.  Medications: Medications Prior to Admission  Medication Sig Dispense Refill  . Apoaequorin (PREVAGEN PO) Take 1 tablet by mouth daily.    . ARIPiprazole (ABILIFY MAINTENA) 400 MG SUSR Inject 400 mg into the muscle every 30 (thirty) days. 1  each 2  . aspirin 81 MG tablet Take 81 mg by mouth daily.    . benztropine (COGENTIN) 1 MG tablet Take 1 tablet (1 mg total) by mouth daily. 30 tablet 2  . escitalopram (LEXAPRO) 20 MG tablet Take 1 tablet (20 mg total) by mouth daily. 30 tablet 2  . Flaxseed, Linseed, (FLAXSEED OIL PO) Take 1 tablet by mouth daily.    . Multiple Vitamin (MULTIVITAMIN WITH MINERALS) TABS tablet Take 1 tablet by mouth daily.    Marland Kitchen omeprazole (PRILOSEC) 40 MG capsule TAKE ONE CAPSULE BY MOUTH TWICE DAILY (Patient taking differently: TAKE ONE CAPSULE BY MOUTH every morning) 60 capsule 5  . oxymetazoline (AFRIN) 0.05 % nasal spray Place 1 spray into both nostrils 2 (two) times daily as needed for congestion.     . temazepam (RESTORIL) 15 MG capsule  Take 1 capsule (15 mg total) by mouth at bedtime as needed. for sleep 30 capsule 2  . traZODone (DESYREL) 100 MG tablet Take 1 tablet (100 mg total) by mouth at bedtime. 30 tablet 2  . Vitamin D, Cholecalciferol, 1000 UNITS TABS Take 1 tablet by mouth daily.      Results for orders placed or performed during the hospital encounter of 10/29/15 (from the past 48 hour(s))  CBC with Differential/Platelet     Status: None   Collection Time: 10/29/15  9:13 AM  Result Value Ref Range   WBC 4.9 4.0 - 10.5 K/uL   RBC 5.32 4.22 - 5.81 MIL/uL   Hemoglobin 15.6 13.0 - 17.0 g/dL   HCT 45.2 39.0 - 52.0 %   MCV 85.0 78.0 - 100.0 fL   MCH 29.3 26.0 - 34.0 pg   MCHC 34.5 30.0 - 36.0 g/dL   RDW 14.2 11.5 - 15.5 %   Platelets 197 150 - 400 K/uL   Neutrophils Relative % 45 %   Neutro Abs 2.2 1.7 - 7.7 K/uL   Lymphocytes Relative 44 %   Lymphs Abs 2.2 0.7 - 4.0 K/uL   Monocytes Relative 8 %   Monocytes Absolute 0.4 0.1 - 1.0 K/uL   Eosinophils Relative 2 %   Eosinophils Absolute 0.1 0.0 - 0.7 K/uL   Basophils Relative 1 %   Basophils Absolute 0.1 0.0 - 0.1 K/uL  I-stat chem 8, ed     Status: Abnormal   Collection Time: 10/29/15  9:20 AM  Result Value Ref Range   Sodium 141 135 - 145 mmol/L   Potassium 3.7 3.5 - 5.1 mmol/L   Chloride 105 101 - 111 mmol/L   BUN 14 6 - 20 mg/dL   Creatinine, Ser 0.90 0.61 - 1.24 mg/dL   Glucose, Bld 165 (H) 65 - 99 mg/dL   Calcium, Ion 1.15 1.13 - 1.30 mmol/L   TCO2 24 0 - 100 mmol/L   Hemoglobin 16.3 13.0 - 17.0 g/dL   HCT 48.0 39.0 - 52.0 %    Dg Wrist Complete Right  10/29/2015  CLINICAL DATA:  Status post fall, right wrist pain EXAM: RIGHT WRIST - COMPLETE 3+ VIEW COMPARISON:  None. FINDINGS: There is a comminuted distal radial metaphysis fracture with articular surface involvement. There is 17 mm of volar displacement. There is no radiocarpal dislocation. There is a nondisplaced fracture at the base of the ulnar styloid process. There is generalized  osteopenia. 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 IMPRESSION: 1. Comminuted distal radial metaphysis fracture with articular surface involvement. There is 17 mm of volar displacement. 2. Nondisplaced fracture at  the base of the ulnar styloid process. Electronically Signed   By: Kathreen Devoid   On: 10/29/2015 10:07   Dg Knee Complete 4 Views Left  10/29/2015  CLINICAL DATA:  Status post fall with left knee pain. EXAM: LEFT KNEE - COMPLETE 4+ VIEW COMPARISON:  None. FINDINGS: The left knee demonstrates a total knee arthroplasty without evidence of hardware failure complication. There is no significant joint effusion. There is no fracture or dislocation. The alignment is anatomic. IMPRESSION: No acute osseous injury of the left knee. Electronically Signed   By: Kathreen Devoid   On: 10/29/2015 10:04     A comprehensive review of systems was negative.  Blood pressure 132/67, pulse 78, temperature 98 F (36.7 C), temperature source Oral, resp. rate 18, height 5' 9.5" (1.765 m), weight 118.389 kg (261 lb), SpO2 93 %.  General appearance: alert, cooperative and appears stated age Head: Normocephalic, without obvious abnormality, atraumatic Neck: supple, symmetrical, trachea midline Resp: clear to auscultation bilaterally Cardio: regular rate and rhythm GI: abdomen soft Extremities: intact sensation and capillary refill all digits.  +epl/fpl/io.  Visible deformity at wrist. Pulses: 2+ and symmetric Skin: Skin color, texture, turgor normal. No rashes or lesions Neurologic: Grossly normal Incision/Wound: Scrapes on right hand.  Small wound at dorsoulnar side of wrist.  No active bleeding.    Assessment/Plan Right distal radius fracture with possible open ulna component.  Non operative and operative treatment options were discussed with the patient and patient wishes to proceed with operative treatment. Recommend OR for ORIF and I&D of fracture.  Risks, benefits, and  alternatives of surgery were discussed and the patient agrees with the plan of care.   Roseline Ebarb R 10/29/2015, 1:37 PM

## 2015-10-29 NOTE — Progress Notes (Signed)
Assisted Dr. Rose with right, ultrasound guided, supraclavicular block. Side rails up, monitors on throughout procedure. See vital signs in flow sheet. Tolerated Procedure well. 

## 2015-10-29 NOTE — Op Note (Signed)
NAMETREMAYNE, POLYNICE NO.:  [id number]  MEDICAL RECORD NO.:  [id number]  LOCATION:  WOTF                         FACILITY:  [facility]  PHYSICIAN:  Leanora Cover, MD        DATE OF BIRTH:  March 16, 1941  DATE OF PROCEDURE:  10/29/2015 DATE OF DISCHARGE:                              OPERATIVE REPORT   PREOPERATIVE DIAGNOSES:  Right comminuted intra-articular distal radius fracture and possible open ulna fracture.  POSTOPERATIVE DIAGNOSES:  Right comminuted intra-articular distal radius fracture three or more articular fragments, open ulnar styloid fracture, and elbow dislocation.  PROCEDURE:   1. Open reduction and internal fixation of right comminuted intra-articular distal radius fracture greater than 3 intra-articular fragments.   2. Irrigation and debridement including skin and subcutaneous tissues including sharp debridement with knife of ulnar styloid Fracture 3. Closed reduction of right elbow dislocation 4. Right brachioradialis lengthening with repair.  SURGEON:  Leanora Cover, MD.  ASSISTANT:  Daryll Brod, MD.  ANESTHESIA:  General with regional.  IV FLUIDS:  Per anesthesia flow sheet.  ESTIMATED BLOOD LOSS:  Minimal.  COMPLICATIONS:  None.  SPECIMENS:  None.  TOURNIQUET TIME:  71 minutes.  DISPOSITION:  Stable to PACU.  INDICATIONS:  Mr. Feezor is a 75 year old male who this morning fell while carrying a basket injuring his right arm.  He was seen at [facility] Department where radiographs were taken revealing a right distal radius fracture.  There was a wound at the dorsal ulnar side of the wrist, it was felt to be open.  He was transferred over to [facility] for further evaluation and care.  I recommended operative fixation of distal radius fracture with irrigation and debridement of the possible open distal ulna fracture.  Risks, benefits, and alternatives of surgery were discussed including risk of blood loss; infection; damage to  nerves, vessels, tendons, ligaments, bone; failure of surgery; need for additional surgery; complications with wound healing; continued pain; nonunion; malunion; stiffness, and infection.  He voiced understanding of these risks and elected to proceed.  He was also noted to have a tender right elbow preoperatively.  OPERATIVE COURSE:  After being identified preoperatively by myself, the patient and I agreed upon procedure and site of procedure.  Surgical site was marked.  The risks, benefits, and alternatives of surgery were reviewed and he wished to proceed.  Surgical consent had been signed. He was given IV Ancef as preoperative antibiotic coverage.  He had a penicillin allergy listed but after test dose had no reaction to Ancef. He was given a regional block by Anesthesia in preoperative holding.  He was transferred to the operating room, placed on the operating table in supine position with the right upper extremity on arm board.  General anesthesia was induced by anesthesiologist.  Right upper extremity was prepped and draped in normal sterile orthopedic fashion.  A surgical pause was performed between surgeons, anesthesia, and operating room staff, and all were in agreement as to the patient, procedure, and site of procedure.  Tourniquet at the proximal aspect of the extremity was inflated to 250 mmHg after exsanguination of the limb with Esmarch bandage.  The elbow was noted to have difficulty with flexion.  C-arm was used in AP, lateral, and oblique projections to evaluate the elbow. He was noted to have subluxation of the elbow posteriorly.  In the AP view, it looked good, on the lateral view, there was a posterior subluxation with perching of the radial head.  This was later able to be reduced in a closed fashion.  Attention was turned to the open distal ulna fracture.  The wound was explored.  There was a small amount of black grit which was removed sharply with a knife.  This  included the skin and subcutaneous tissues.  The wound was copiously irrigated with sterile saline.  Attention was turned to the distal wrist.  A standard volar Mallie Mussel approach was used.  The bipolar electrocautery was used to obtain hemostasis.  The superficial and deep portions of the FCR tendon sheath were incised, and the FCR and FPL were swept ulnarly to protect the palmar cutaneous branch of the median nerve.  The brachioradialis was identified and released in a step-cut fashion for later repair with lengthening.  The pronator quadratus was released and elevated.  There was significant tearing of the pronator quadratus.  The fracture site was easily identified.  It was cleared of soft tissue interposition and reduced under direct visualization.  There was comminution that extended into the articular surface.  There were styloid fragments, essential fragment and ulnar-sided fragment.  The Acumed volar distal radial locking plate was selected and secured to the bone with the guide pins. Once adequate fit had been obtained, the C-arm was used in AP and lateral projections to ensure appropriate reduction and position of hardware which was the case.  Standard AO drilling measuring technique was used.  A single screw was placed in the slotted hole in the ulnar shaft of the plate.  The distal holes were filled with locking pegs with the exception of the styloid holes, which were filled with locking screws.  Remaining holes in the shaft of the plate were filled with nonlocking screws.  Good purchase was obtained.  C-arm was used in AP, lateral, and oblique projections to ensure appropriate reduction and position of hardware which was the case.  He had good pronation and supination of the wrist.  The wound was copiously irrigated with sterile saline.  The brachioradialis was repaired in a lengthened fashion using 4-0 Vicryl suture.  A single figure-of-eight suture was able to be placed in a  pronator quadratus.  This was distal.  The remainder was too torn to hold the stitch.  Two interrupted Vicryl sutures were placed in subcutaneous tissues and the skin was closed with 4-0 nylon horizontal mattress fashion.  The wound was dressed with sterile Xeroform and 4x4s. The open ulnar-sided wound was dressed with Xeroform and 4 x 4s and wrapped with a Kerlix bandage.  A posterior splint with a side bar was placed and wrapped with Kerlix and Ace bandage.  The elbow was able to be reduced in a closed fashion after procedure.  C-arm was used in AP and lateral projections to ensure appropriate reduction which was the case.  There was good concentric reduction of the radial head and ulnohumeral joint.  The splint was wrapped with an Ace bandage lightly. Tourniquet was deflated at 71 minutes.  Fingertips were pink with brisk capillary refill after deflation of the tourniquet.  The operative drapes were broken down, and the patient was awoken from anesthesia safely.  He was transferred back to stretcher and taken to PACU in stable condition.  I will see him back in the office next week for postoperative followup.  I will give him Percocet 5/325, 1-2 p.o. q.6 hours p.r.n. pain, dispensed #40 and Bactrim DS 1 p.o. b.i.d. x10 days.     Leanora Cover, MD     KK/MEDQ  D:  10/29/2015  T:  10/29/2015  Job:  [id number]

## 2015-10-29 NOTE — Discharge Instructions (Addendum)

## 2015-10-29 NOTE — Op Note (Signed)
[id number] 

## 2015-10-30 ENCOUNTER — Encounter (HOSPITAL_BASED_OUTPATIENT_CLINIC_OR_DEPARTMENT_OTHER): Payer: Self-pay | Admitting: Orthopedic Surgery

## 2015-10-30 NOTE — Anesthesia Postprocedure Evaluation (Signed)
Anesthesia Post Note  Patient: Sean Hill  Procedure(s) Performed: Procedure(s) (LRB): OPEN REDUCTION INTERNAL FIXATION (ORIF) DISTAL RADIAL FRACTURE (Right)  Patient location during evaluation: PACU Anesthesia Type: General and Regional Level of consciousness: awake and alert Pain management: pain level controlled Vital Signs Assessment: post-procedure vital signs reviewed and stable Respiratory status: spontaneous breathing, nonlabored ventilation, respiratory function stable and patient connected to nasal cannula oxygen Cardiovascular status: blood pressure returned to baseline and stable Postop Assessment: no signs of nausea or vomiting Anesthetic complications: no    Last Vitals:  Filed Vitals:   10/29/15 1745 10/29/15 1815  BP: 140/77 154/84  Pulse: 88 88  Temp:  37.1 C  Resp: 17 18    Last Pain:  Filed Vitals:   10/30/15 0943  PainSc: 2                  Gina Leblond S

## 2015-10-31 ENCOUNTER — Other Ambulatory Visit (HOSPITAL_COMMUNITY): Payer: Self-pay | Admitting: Psychiatry

## 2015-11-02 DIAGNOSIS — M79601 Pain in right arm: Secondary | ICD-10-CM | POA: Diagnosis not present

## 2015-11-02 DIAGNOSIS — S52531A Colles' fracture of right radius, initial encounter for closed fracture: Secondary | ICD-10-CM | POA: Diagnosis not present

## 2015-11-02 DIAGNOSIS — S53104D Unspecified dislocation of right ulnohumeral joint, subsequent encounter: Secondary | ICD-10-CM | POA: Diagnosis not present

## 2015-11-04 ENCOUNTER — Other Ambulatory Visit (HOSPITAL_COMMUNITY): Payer: Self-pay | Admitting: Psychiatry

## 2015-11-04 NOTE — Telephone Encounter (Signed)
Given on January 23.  It is too soon to refill.

## 2015-11-16 ENCOUNTER — Ambulatory Visit: Payer: Self-pay | Admitting: Adult Health

## 2015-11-16 ENCOUNTER — Encounter: Payer: Self-pay | Admitting: Adult Health

## 2015-11-16 ENCOUNTER — Ambulatory Visit (INDEPENDENT_AMBULATORY_CARE_PROVIDER_SITE_OTHER)
Admission: RE | Admit: 2015-11-16 | Discharge: 2015-11-16 | Disposition: A | Discharge status: Home / Self Care | Payer: PPO | Source: Ambulatory Visit | Attending: Adult Health | Admitting: Adult Health

## 2015-11-16 ENCOUNTER — Ambulatory Visit (INDEPENDENT_AMBULATORY_CARE_PROVIDER_SITE_OTHER): Payer: PPO | Admitting: Adult Health

## 2015-11-16 VITALS — BP 120/70 | Temp 99.2°F | Ht 69.5 in | Wt 254.7 lb

## 2015-11-16 DIAGNOSIS — M79671 Pain in right foot: Secondary | ICD-10-CM | POA: Diagnosis not present

## 2015-11-16 DIAGNOSIS — M25572 Pain in left ankle and joints of left foot: Secondary | ICD-10-CM | POA: Diagnosis not present

## 2015-11-16 DIAGNOSIS — S92415A Nondisplaced fracture of proximal phalanx of left great toe, initial encounter for closed fracture: Secondary | ICD-10-CM | POA: Diagnosis not present

## 2015-11-16 DIAGNOSIS — M25579 Pain in unspecified ankle and joints of unspecified foot: Secondary | ICD-10-CM | POA: Diagnosis not present

## 2015-11-16 DIAGNOSIS — M25571 Pain in right ankle and joints of right foot: Secondary | ICD-10-CM

## 2015-11-16 DIAGNOSIS — M7989 Other specified soft tissue disorders: Secondary | ICD-10-CM | POA: Diagnosis not present

## 2015-11-16 NOTE — Patient Instructions (Addendum)
It was great seeing you again!  I will follow up with you regarding your x rays.   Follow up with me for your physical.   Please let me know if you need anything.

## 2015-11-16 NOTE — Progress Notes (Signed)
Subjective:    Patient ID: Sean Hill, male    DOB: 1941-02-11, 75 y.o.   MRN: [id number]  HPI  75 year old male, who presents to the office today for evaluation of bilateral ankle and foot pain since 10/29/2015. On this day he was seen in the ER for Right comminuted intra-articular distal radius fracture and possible open ulna fracture after a fall while carrying a basket.   Unfortunately, this appeared to be a hard fall - he has had prior surgery on both ankles with fusion multiple years ago. He believes that he re injured them during the fall. He complains of pain, especially across the top of the right foot.  He is able to move his feet vertically but not horizontally ( due to fusions).   Some aspects of his feet feel numb from time to time.   Review of Systems  Constitutional: Negative.   Musculoskeletal: Positive for back pain and arthralgias. Negative for gait problem.  Skin: Positive for color change. Negative for pallor, rash and wound.  All other systems reviewed and are negative.  Past Medical History  Diagnosis Date  . Bipolar disorder [facility])     Has psychiatrist.  BH admission (suicidal) 07/20/11.  Marland Kitchen Hyperlipemia     Intol of meds: GI side effects  . 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  No hx of MRSA infection.  . 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 Nephrolithiasis   . GERD (gastroesophageal reflux disease)   . Inguinal hernia 02/25/11    Left sided hernia with fat  . Hip pain, left 2011-2012    Left hip and groin: MRI pelvis and left hip 02/18/11 showed NORMAL hip, with small inguinal hernia containing fat and sigmoid diverticulosis.  Hip pain presumably referred pain from hernia/diverticular dz??.  . Cervical spondylosis     Primarily C4-5 and C5-6--referred to [facility] 02/2011.  . Depression   . Bipolar disorder, current episode depressed, moderate ([place])   . Sleep apnea     . Asthma   . Overdose of benzodiazepine 04/30/2013    Status post  . Hypertension 02/23/15    Dr. Parke Poisson started Lisinopril    Social History   Social History  . Marital Status: Married    Spouse Name: N/A  . Number of Children: N/A  . Years of Education: N/A   Occupational History  . Not on file.   Social History Main Topics  . Smoking status: Never Smoker   . Smokeless tobacco: Not on file  . Alcohol Use: No     Comment: One drink rarely  . Drug Use: No  . Sexual Activity: Not Currently    Birth Control/ Protection: None     Comment: Same partner for 45 years   Other Topics Concern  . Not on file   Social History Narrative   Married, lives in [place].  Retired Dance movement psychotherapist.   Two daughters, both married, 2 grandchildren.   No regular exercise.  Never smoker.  No ETOH.  No drugs.    Past Surgical History  Procedure Laterality Date  . 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 Ankle fracture surgery      hardware still in both ankles (titanium)  . Inguinal hernia repair      Right side with mesh about 1993  . Elbow surgery  left---tendon  . Ankle surgery      Ankle tendon surgery  . Tonsillectomy    . Appendectomy    . Open reduction internal fixation (orif) distal radial fracture Right 10/29/2015    Procedure: OPEN REDUCTION INTERNAL FIXATION (ORIF) DISTAL RADIAL FRACTURE;  Surgeon: Leanora Cover, MD;  Location: [facility];  Service: Orthopedics;  Laterality: Right;    Family History  Problem Relation Age of Onset  . Alcohol abuse Mother   . Alcohol abuse Father   . Depression Daughter   . Paranoid behavior Daughter     Allergies  Allergen Reactions  . Codeine Shortness Of Breath  . Penicillins Hives, Rash and Other (See Comments)    Watery blisters Has patient had a PCN reaction causing immediate rash, facial/tongue/throat swelling, SOB or lightheadedness with hypotension: No Has patient had a PCN reaction causing  severe rash involving mucus membranes or skin necrosis: No Has patient had a PCN reaction that required hospitalization No Has patient had a PCN reaction occurring within the last 10 years: No If all of the above answers are "NO", then may proceed with Cephalosporin use.     Current Outpatient Prescriptions on File Prior to Visit  Medication Sig Dispense Refill  . Apoaequorin (PREVAGEN PO) Take 1 tablet by mouth daily.    . ARIPiprazole (ABILIFY MAINTENA) 400 MG SUSR Inject 400 mg into the muscle every 30 (thirty) days. 1 each 2  . aspirin 81 MG tablet Take 81 mg by mouth daily.    . benztropine (COGENTIN) 1 MG tablet Take 1 tablet (1 mg total) by mouth daily. 30 tablet 2  . escitalopram (LEXAPRO) 20 MG tablet Take 1 tablet (20 mg total) by mouth daily. 30 tablet 2  . Flaxseed, Linseed, (FLAXSEED OIL PO) Take 1 tablet by mouth daily.    . Multiple Vitamin (MULTIVITAMIN WITH MINERALS) TABS tablet Take 1 tablet by mouth daily.    Marland Kitchen omeprazole (PRILOSEC) 40 MG capsule TAKE ONE CAPSULE BY MOUTH TWICE DAILY (Patient taking differently: TAKE ONE CAPSULE BY MOUTH every morning) 60 capsule 5  . oxyCODONE-acetaminophen (PERCOCET) 5-325 MG tablet 1-2 tabs po q6 hours prn pain 40 tablet 0  . oxymetazoline (AFRIN) 0.05 % nasal spray Place 1 spray into both nostrils 2 (two) times daily as needed for congestion.     . temazepam (RESTORIL) 15 MG capsule Take 1 capsule (15 mg total) by mouth at bedtime as needed. for sleep 30 capsule 2  . traZODone (DESYREL) 100 MG tablet Take 1 tablet (100 mg total) by mouth at bedtime. 30 tablet 2  . Vitamin D, Cholecalciferol, 1000 UNITS TABS Take 1 tablet by mouth daily.     No current facility-administered medications on file prior to visit.    BP 120/70 mmHg  Temp(Src) 99.2 F (37.3 C) (Oral)  Ht 5' 9.5" (1.765 m)  Wt 254 lb 11.2 oz (115.531 kg)  BMI 37.09 kg/m2       Objective:   Physical Exam  Constitutional: He is oriented to person, place, and time.  He appears well-developed and well-nourished. No distress.  Cardiovascular: Normal rate, regular rhythm, normal heart sounds and intact distal pulses.  Exam reveals no gallop and no friction rub.   No murmur heard. Pulmonary/Chest: Effort normal and breath sounds normal. No respiratory distress. He has no wheezes. He has no rales. He exhibits no tenderness.  Musculoskeletal: Normal range of motion. He exhibits tenderness. He exhibits no edema.  Tenderness with palpation across top of  right foot. Vertical movements, no horizontal movements. Good distal pulses, good cap refill. Slight redness discoloration to left second toe. No warmness or edema noted.   Neurological: He is alert and oriented to person, place, and time. He has normal reflexes.  Skin: Skin is warm and dry. No rash noted. He is not diaphoretic. No erythema. No pallor.  Psychiatric: He has a normal mood and affect. His behavior is normal. Judgment and thought content normal.  Nursing note and vitals reviewed.     Assessment & Plan:  1. Pain in joint, ankle and foot, unspecified laterality - Likely contusions from fall. Due to extensive orthopedic work, will get imaging to make sure.  - DG Ankle Complete Left; Future - DG Foot Complete Left; Future - DG Foot Complete Right; Future - DG Ankle Complete Right; Future - Consider ortho consult.

## 2015-11-16 NOTE — Progress Notes (Signed)
Pre visit review using our clinic review tool, if applicable. No additional management support is needed unless otherwise documented below in the visit note. 

## 2015-11-17 ENCOUNTER — Telehealth: Payer: Self-pay | Admitting: Adult Health

## 2015-11-17 MED ORDER — BUPIVACAINE HCL (PF) 0.5 % IJ SOLN
INTRAMUSCULAR | Status: DC | PRN
  Administered 2015-10-29: 30 mL via PERINEURAL

## 2015-11-17 NOTE — Addendum Note (Signed)
Addendum  created 11/17/15 0750 by Myrtie Soman, MD   Modules edited: Anesthesia Blocks and Procedures, Anesthesia Medication Administration, Clinical Notes   Clinical Notes:  File: [id number]

## 2015-11-17 NOTE — Telephone Encounter (Signed)
Spoke to Sean Hill on the phone and updated him on his x rays.

## 2015-11-19 DIAGNOSIS — S52614E Nondisplaced fracture of right ulna styloid process, subsequent encounter for open fracture type I or II with routine healing: Secondary | ICD-10-CM | POA: Diagnosis not present

## 2015-11-19 DIAGNOSIS — S52571D Other intraarticular fracture of lower end of right radius, subsequent encounter for closed fracture with routine healing: Secondary | ICD-10-CM | POA: Diagnosis not present

## 2015-11-19 DIAGNOSIS — M79601 Pain in right arm: Secondary | ICD-10-CM | POA: Diagnosis not present

## 2015-11-19 DIAGNOSIS — S53104D Unspecified dislocation of right ulnohumeral joint, subsequent encounter: Secondary | ICD-10-CM | POA: Diagnosis not present

## 2015-11-23 ENCOUNTER — Ambulatory Visit (INDEPENDENT_AMBULATORY_CARE_PROVIDER_SITE_OTHER): Payer: PPO

## 2015-11-23 VITALS — BP 145/79 | HR 78 | Ht 70.0 in | Wt 256.2 lb

## 2015-11-23 DIAGNOSIS — F319 Bipolar disorder, unspecified: Secondary | ICD-10-CM | POA: Diagnosis not present

## 2015-11-23 DIAGNOSIS — F3110 Bipolar disorder, current episode manic without psychotic features, unspecified: Secondary | ICD-10-CM

## 2015-11-23 MED ORDER — ARIPIPRAZOLE ER 400 MG IM SUSR
400.0000 mg | INTRAMUSCULAR | Status: DC
  Administered 2015-11-23: 400 mg via INTRAMUSCULAR

## 2015-11-23 NOTE — Progress Notes (Signed)
Patient ID: Sean Hill, male   DOB: May 21, 1941, 75 y.o.   MRN: [id number] Patient came in for Abilify Maintena IM 400 mg injection today. Patient is in a splint on his Left wrist and he explains he took a fall, had a severe break and needed surgery. He is healing fast and seems to be feeling good. Patient denies any auditory or visual hallucinations. He states he is sleeping and eating good. Abilify Maintena 400 mg injection was prepared as ordered and injected IM into patients left upper outer gluteal area. The patient tolerated the procedure well and agreed to come back in 30 days.

## 2015-11-25 DIAGNOSIS — S53104D Unspecified dislocation of right ulnohumeral joint, subsequent encounter: Secondary | ICD-10-CM | POA: Diagnosis not present

## 2015-11-25 DIAGNOSIS — S52571D Other intraarticular fracture of lower end of right radius, subsequent encounter for closed fracture with routine healing: Secondary | ICD-10-CM | POA: Diagnosis not present

## 2015-11-25 DIAGNOSIS — S52614E Nondisplaced fracture of right ulna styloid process, subsequent encounter for open fracture type I or II with routine healing: Secondary | ICD-10-CM | POA: Diagnosis not present

## 2015-12-03 ENCOUNTER — Telehealth (HOSPITAL_COMMUNITY): Payer: Self-pay

## 2015-12-03 NOTE — Telephone Encounter (Signed)
Met with Dr. Adele Schilder to complete patient's Tier exception request to have Abilify Maintena 400 mg IM every 30 days lowered to a more cost effective tier 3 medication under patient's current insurance plan.  Faxed forms with Dr. Marguerite Olea signature and information into [facility] [phone].

## 2015-12-03 NOTE — Telephone Encounter (Signed)
Telephone call with patient's [place] to request forms for a tier exception request for patient's Ability Maintena after receiving a message from the [place] Patient assistance program that patient did not qualify for Abilify Maintena under their program but should be approved under his current insurance as a Tier 4 medication.  Spoke with Butch Penny, representative with [employer] who verified Abilify Maintena should be covered as a tier 4 medication under their plan, that at present he did not need a prior authorization and they agreed to fax forms for our office to complete to request a tier exception so patient could pay less monthly for the medication.  Currently patient would be paying $85 a month but if approved would decrease cost of monthly injection to $45 a month.  Forms will be completed upon receipt and returned with request to assist with attempt to lower patient's monthly cost for medication.

## 2015-12-09 DIAGNOSIS — F319 Bipolar disorder, unspecified: Secondary | ICD-10-CM | POA: Diagnosis not present

## 2015-12-09 DIAGNOSIS — F411 Generalized anxiety disorder: Secondary | ICD-10-CM | POA: Diagnosis not present

## 2015-12-11 DIAGNOSIS — S52571D Other intraarticular fracture of lower end of right radius, subsequent encounter for closed fracture with routine healing: Secondary | ICD-10-CM | POA: Diagnosis not present

## 2015-12-11 DIAGNOSIS — M79601 Pain in right arm: Secondary | ICD-10-CM | POA: Diagnosis not present

## 2015-12-11 DIAGNOSIS — S53104D Unspecified dislocation of right ulnohumeral joint, subsequent encounter: Secondary | ICD-10-CM | POA: Diagnosis not present

## 2015-12-11 DIAGNOSIS — S52614E Nondisplaced fracture of right ulna styloid process, subsequent encounter for open fracture type I or II with routine healing: Secondary | ICD-10-CM | POA: Diagnosis not present

## 2015-12-14 ENCOUNTER — Other Ambulatory Visit: Payer: Self-pay | Admitting: Orthopedic Surgery

## 2015-12-14 DIAGNOSIS — S53104D Unspecified dislocation of right ulnohumeral joint, subsequent encounter: Secondary | ICD-10-CM

## 2015-12-20 ENCOUNTER — Other Ambulatory Visit (HOSPITAL_COMMUNITY): Payer: Self-pay | Admitting: Psychiatry

## 2015-12-21 ENCOUNTER — Other Ambulatory Visit (HOSPITAL_COMMUNITY): Payer: Self-pay | Admitting: Psychiatry

## 2015-12-22 ENCOUNTER — Other Ambulatory Visit (HOSPITAL_COMMUNITY): Payer: Self-pay | Admitting: Psychiatry

## 2015-12-23 ENCOUNTER — Telehealth (HOSPITAL_COMMUNITY): Payer: Self-pay

## 2015-12-23 ENCOUNTER — Ambulatory Visit (INDEPENDENT_AMBULATORY_CARE_PROVIDER_SITE_OTHER): Payer: PPO

## 2015-12-23 VITALS — BP 132/76 | HR 70 | Ht 70.0 in | Wt 258.8 lb

## 2015-12-23 DIAGNOSIS — F3131 Bipolar disorder, current episode depressed, mild: Secondary | ICD-10-CM | POA: Diagnosis not present

## 2015-12-23 MED ORDER — ARIPIPRAZOLE ER 400 MG IM SUSR
400.0000 mg | INTRAMUSCULAR | Status: DC
  Administered 2015-12-23: 400 mg via INTRAMUSCULAR

## 2015-12-23 NOTE — Progress Notes (Signed)
Patient ID: Sean Hill, male   DOB: 11-Jun-1941, 75 y.o.   MRN: [id number] Patient presented with flat affect, level mood but acknowledged he has had more "down days" over the past month as he prepares for probable right elbow surgery to stabilize the joint to stop it from popping out of place.  Patient denied any auditory or visual hallucinations, no thoughts of wanting to harm self or others and reported still doing okay with current medication regimen with Abilify Maintena each month.  Discussed with patient call to his insurance company this date to follow up on his requested tier exception for cost of Abilify Maintena, which had been denied but representative requested we resubmit the request as states she did not think it was reviewed correctly as only a tier exception and would possibly be approved.  Informed patient this nurse would resubmit the tier exception request today.  Prepared a provided sample of Abilify Maintena 400 mg IM for injection this date as ordered by standing order from Dr. Adele Schilder.  Injection administered in patient's right upper outer gluteal area and patient tolerated injection without compliant of pain or discomfort.  Patient to return in 30 days for next injection and to call back if any continued symptoms of depression persist or worsen as patient reports thinking this will improve once right elbow repaired.  Patient to call as needed and denies any suicidal or homicidal ideations at this time.

## 2015-12-23 NOTE — Telephone Encounter (Signed)
Faxed a request for patient's Abilify Maintena to be covered as a Tier exception through patient's insurance for the second time as it was denied but representative admitted on phone call today they denied approval but did not even look at the request as a tier exception even though this was clearly identified on the request.  Informed patient while in for injection today of the problem and re-faxed tier exception request as the medication is approved through his insurance but will cost approximately $85 a month and trying to have this lowered to $45 a month due to his current finances. Request faxed to [facility] with highlighted areas on request to again identify this request as a tier exception.  Agreed to contact patient once a decision was received.

## 2015-12-24 ENCOUNTER — Ambulatory Visit
Admission: RE | Admit: 2015-12-24 | Discharge: 2015-12-24 | Disposition: A | Discharge status: Home / Self Care | Payer: PPO | Source: Ambulatory Visit | Attending: Orthopedic Surgery | Admitting: Orthopedic Surgery

## 2015-12-24 DIAGNOSIS — S53104D Unspecified dislocation of right ulnohumeral joint, subsequent encounter: Secondary | ICD-10-CM

## 2015-12-24 DIAGNOSIS — S53094A Other dislocation of right radial head, initial encounter: Secondary | ICD-10-CM | POA: Diagnosis not present

## 2015-12-24 MED ORDER — IOHEXOL 180 MG/ML  SOLN
5.0000 mL | Freq: Once | INTRAMUSCULAR | Status: AC | PRN
  Administered 2015-12-24: 5 mL via INTRA_ARTICULAR

## 2015-12-25 ENCOUNTER — Other Ambulatory Visit: Payer: Self-pay | Admitting: Orthopedic Surgery

## 2015-12-28 ENCOUNTER — Encounter (HOSPITAL_BASED_OUTPATIENT_CLINIC_OR_DEPARTMENT_OTHER): Payer: Self-pay | Admitting: *Deleted

## 2015-12-28 NOTE — Progress Notes (Signed)
   12/28/15 0925  OBSTRUCTIVE SLEEP APNEA  Have you ever been diagnosed with sleep apnea through a sleep study? No  Do you snore loudly (loud enough to be heard through closed doors)?  1  Do you often feel tired, fatigued, or sleepy during the daytime (such as falling asleep during driving or talking to someone)? 0  Has anyone observed you stop breathing during your sleep? 1  Do you have, or are you being treated for high blood pressure? 0  BMI more than 35 kg/m2? 1  Age > 50 (1-yes) 1  Neck circumference greater than:Male 16 inches or larger, Male 17inches or larger? 0  Male Gender (Yes=1) 1  Obstructive Sleep Apnea Score 5

## 2015-12-28 NOTE — Progress Notes (Signed)
Chart reviewed by Dr Al Corpus, EKG with new LBBB, hx OSA, HTN. OK for [facility].

## 2015-12-29 ENCOUNTER — Ambulatory Visit (HOSPITAL_BASED_OUTPATIENT_CLINIC_OR_DEPARTMENT_OTHER): Payer: PPO | Admitting: Anesthesiology

## 2015-12-29 ENCOUNTER — Ambulatory Visit (HOSPITAL_BASED_OUTPATIENT_CLINIC_OR_DEPARTMENT_OTHER)
Admission: RE | Admit: 2015-12-29 | Discharge: 2015-12-29 | Disposition: A | Discharge status: Home / Self Care | Payer: PPO | Source: Ambulatory Visit | Attending: Orthopedic Surgery | Admitting: Orthopedic Surgery

## 2015-12-29 ENCOUNTER — Encounter (HOSPITAL_BASED_OUTPATIENT_CLINIC_OR_DEPARTMENT_OTHER)
Admission: RE | Disposition: A | Discharge status: Home / Self Care | Payer: Self-pay | Source: Ambulatory Visit | Attending: Orthopedic Surgery

## 2015-12-29 ENCOUNTER — Encounter (HOSPITAL_BASED_OUTPATIENT_CLINIC_OR_DEPARTMENT_OTHER): Payer: Self-pay | Admitting: Anesthesiology

## 2015-12-29 DIAGNOSIS — E785 Hyperlipidemia, unspecified: Secondary | ICD-10-CM | POA: Diagnosis not present

## 2015-12-29 DIAGNOSIS — X58XXXA Exposure to other specified factors, initial encounter: Secondary | ICD-10-CM | POA: Diagnosis not present

## 2015-12-29 DIAGNOSIS — K219 Gastro-esophageal reflux disease without esophagitis: Secondary | ICD-10-CM | POA: Diagnosis not present

## 2015-12-29 DIAGNOSIS — Z7982 Long term (current) use of aspirin: Secondary | ICD-10-CM | POA: Diagnosis not present

## 2015-12-29 DIAGNOSIS — Z880 Allergy status to penicillin: Secondary | ICD-10-CM | POA: Insufficient documentation

## 2015-12-29 DIAGNOSIS — S53441A Ulnar collateral ligament sprain of right elbow, initial encounter: Secondary | ICD-10-CM | POA: Diagnosis not present

## 2015-12-29 DIAGNOSIS — S56511A Strain of other extensor muscle, fascia and tendon at forearm level, right arm, initial encounter: Secondary | ICD-10-CM | POA: Diagnosis not present

## 2015-12-29 DIAGNOSIS — G4730 Sleep apnea, unspecified: Secondary | ICD-10-CM | POA: Insufficient documentation

## 2015-12-29 DIAGNOSIS — Z22322 Carrier or suspected carrier of Methicillin resistant Staphylococcus aureus: Secondary | ICD-10-CM | POA: Diagnosis not present

## 2015-12-29 DIAGNOSIS — M25521 Pain in right elbow: Secondary | ICD-10-CM | POA: Diagnosis not present

## 2015-12-29 DIAGNOSIS — Z6836 Body mass index (BMI) 36.0-36.9, adult: Secondary | ICD-10-CM | POA: Insufficient documentation

## 2015-12-29 DIAGNOSIS — I10 Essential (primary) hypertension: Secondary | ICD-10-CM | POA: Diagnosis not present

## 2015-12-29 DIAGNOSIS — G8918 Other acute postprocedural pain: Secondary | ICD-10-CM | POA: Diagnosis not present

## 2015-12-29 HISTORY — PX: ULNAR COLLATERAL LIGAMENT REPAIR: SHX6159

## 2015-12-29 SURGERY — REPAIR, LIGAMENT, ULNAR COLLATERAL
Anesthesia: Regional | Site: Elbow | Laterality: Right

## 2015-12-29 MED ORDER — ONDANSETRON HCL 4 MG/2ML IJ SOLN
INTRAMUSCULAR | Status: DC | PRN
  Administered 2015-12-29: 4 mg via INTRAVENOUS

## 2015-12-29 MED ORDER — FENTANYL CITRATE (PF) 100 MCG/2ML IJ SOLN
INTRAMUSCULAR | Status: AC
  Filled 2015-12-29: qty 2

## 2015-12-29 MED ORDER — HYDROMORPHONE HCL 1 MG/ML IJ SOLN: 0.2500 mg | INTRAMUSCULAR | Status: DC | PRN

## 2015-12-29 MED ORDER — LIDOCAINE HCL (CARDIAC) 20 MG/ML IV SOLN
INTRAVENOUS | Status: DC | PRN
  Administered 2015-12-29: 20 mg via INTRAVENOUS

## 2015-12-29 MED ORDER — MIDAZOLAM HCL 2 MG/2ML IJ SOLN
INTRAMUSCULAR | Status: AC
  Filled 2015-12-29: qty 2

## 2015-12-29 MED ORDER — BUPIVACAINE HCL (PF) 0.25 % IJ SOLN
INTRAMUSCULAR | Status: DC | PRN
  Administered 2015-12-29: 10 mL

## 2015-12-29 MED ORDER — BUPIVACAINE-EPINEPHRINE (PF) 0.5% -1:200000 IJ SOLN
INTRAMUSCULAR | Status: DC | PRN
  Administered 2015-12-29: 30 mL via PERINEURAL

## 2015-12-29 MED ORDER — MEPERIDINE HCL 25 MG/ML IJ SOLN: 6.2500 mg | INTRAMUSCULAR | Status: DC | PRN

## 2015-12-29 MED ORDER — OXYCODONE HCL 5 MG PO TABS: 5.0000 mg | ORAL_TABLET | Freq: Once | ORAL | Status: DC | PRN

## 2015-12-29 MED ORDER — DEXAMETHASONE SODIUM PHOSPHATE 10 MG/ML IJ SOLN
INTRAMUSCULAR | Status: AC
  Filled 2015-12-29: qty 1

## 2015-12-29 MED ORDER — 0.9 % SODIUM CHLORIDE (POUR BTL) OPTIME
TOPICAL | Status: DC | PRN
  Administered 2015-12-29: 150 mL

## 2015-12-29 MED ORDER — SCOPOLAMINE 1 MG/3DAYS TD PT72: 1.0000 | MEDICATED_PATCH | Freq: Once | TRANSDERMAL | Status: DC | PRN

## 2015-12-29 MED ORDER — VANCOMYCIN HCL IN DEXTROSE 1-5 GM/200ML-% IV SOLN
INTRAVENOUS | Status: AC
  Filled 2015-12-29: qty 200

## 2015-12-29 MED ORDER — CHLORHEXIDINE GLUCONATE 4 % EX LIQD: 60.0000 mL | Freq: Once | CUTANEOUS | Status: DC

## 2015-12-29 MED ORDER — ONDANSETRON HCL 4 MG/2ML IJ SOLN
INTRAMUSCULAR | Status: AC
  Filled 2015-12-29: qty 2

## 2015-12-29 MED ORDER — OXYCODONE HCL 5 MG/5ML PO SOLN: 5.0000 mg | Freq: Once | ORAL | Status: DC | PRN

## 2015-12-29 MED ORDER — MIDAZOLAM HCL 2 MG/2ML IJ SOLN
1.0000 mg | INTRAMUSCULAR | Status: DC | PRN
  Administered 2015-12-29: 2 mg via INTRAVENOUS

## 2015-12-29 MED ORDER — DEXAMETHASONE SODIUM PHOSPHATE 10 MG/ML IJ SOLN
INTRAMUSCULAR | Status: DC | PRN
  Administered 2015-12-29: 10 mg via INTRAVENOUS

## 2015-12-29 MED ORDER — VANCOMYCIN HCL IN DEXTROSE 1-5 GM/200ML-% IV SOLN
1000.0000 mg | INTRAVENOUS | Status: AC
  Administered 2015-12-29: 1000 mg via INTRAVENOUS

## 2015-12-29 MED ORDER — LIDOCAINE HCL (CARDIAC) 20 MG/ML IV SOLN
INTRAVENOUS | Status: AC
  Filled 2015-12-29: qty 5

## 2015-12-29 MED ORDER — OXYCODONE-ACETAMINOPHEN 5-325 MG PO TABS: ORAL_TABLET | ORAL | Status: DC

## 2015-12-29 MED ORDER — PROPOFOL 10 MG/ML IV BOLUS
INTRAVENOUS | Status: AC
  Filled 2015-12-29: qty 40

## 2015-12-29 MED ORDER — PROPOFOL 10 MG/ML IV BOLUS
INTRAVENOUS | Status: DC | PRN
  Administered 2015-12-29: 20 mg via INTRAVENOUS
  Administered 2015-12-29: 200 mg via INTRAVENOUS
  Administered 2015-12-29: 30 mg via INTRAVENOUS

## 2015-12-29 MED ORDER — LACTATED RINGERS IV SOLN
INTRAVENOUS | Status: DC
  Administered 2015-12-29 (×2): via INTRAVENOUS

## 2015-12-29 MED ORDER — FENTANYL CITRATE (PF) 100 MCG/2ML IJ SOLN
50.0000 ug | INTRAMUSCULAR | Status: AC | PRN
  Administered 2015-12-29: 50 ug via INTRAVENOUS
  Administered 2015-12-29: 100 ug via INTRAVENOUS
  Administered 2015-12-29: 50 ug via INTRAVENOUS

## 2015-12-29 MED ORDER — GLYCOPYRROLATE 0.2 MG/ML IJ SOLN: 0.2000 mg | Freq: Once | INTRAMUSCULAR | Status: DC | PRN

## 2015-12-29 SURGICAL SUPPLY — 83 items
ANCH SUT 1 SHRT SM RGD INSRTR (Anchor) ×2 IMPLANT
ANCHOR SUT 1.45 SZ 1 SHORT (Anchor) ×2 IMPLANT
BANDAGE ACE 3X5.8 VEL STRL LF (GAUZE/BANDAGES/DRESSINGS) ×1 IMPLANT
BANDAGE ACE 4X5 VEL STRL LF (GAUZE/BANDAGES/DRESSINGS) ×2 IMPLANT
BLADE MINI RND TIP GREEN BEAV (BLADE) ×2 IMPLANT
BLADE SURG 15 STRL LF DISP TIS (BLADE) ×2 IMPLANT
BLADE SURG 15 STRL SS (BLADE) ×4
BNDG CMPR 9X4 STRL LF SNTH (GAUZE/BANDAGES/DRESSINGS) ×1
BNDG ELASTIC 2X5.8 VLCR STR LF (GAUZE/BANDAGES/DRESSINGS) IMPLANT
BNDG ESMARK 4X9 LF (GAUZE/BANDAGES/DRESSINGS) ×2 IMPLANT
BNDG GAUZE ELAST 4 BULKY (GAUZE/BANDAGES/DRESSINGS) ×2 IMPLANT
CATH ROBINSON RED A/P 8FR (CATHETERS) IMPLANT
CHLORAPREP W/TINT 26ML (MISCELLANEOUS) ×2 IMPLANT
CORDS BIPOLAR (ELECTRODE) ×2 IMPLANT
COVER BACK TABLE 60X90IN (DRAPES) ×2 IMPLANT
COVER MAYO STAND STRL (DRAPES) ×2 IMPLANT
CUFF TOURNIQUET SINGLE 18IN (TOURNIQUET CUFF) ×1 IMPLANT
CUFF TOURNIQUET SINGLE 24IN (TOURNIQUET CUFF) ×1 IMPLANT
DECANTER SPIKE VIAL GLASS SM (MISCELLANEOUS) IMPLANT
DRAPE EXTREMITY T 121X128X90 (DRAPE) ×2 IMPLANT
DRAPE OEC MINIVIEW 54X84 (DRAPES) ×2 IMPLANT
DRAPE SURG 17X23 STRL (DRAPES) ×2 IMPLANT
DRSG PAD ABDOMINAL 8X10 ST (GAUZE/BANDAGES/DRESSINGS) IMPLANT
GAUZE SPONGE 4X4 12PLY STRL (GAUZE/BANDAGES/DRESSINGS) ×2 IMPLANT
GAUZE XEROFORM 1X8 LF (GAUZE/BANDAGES/DRESSINGS) ×2 IMPLANT
GLOVE BIO SURGEON STRL SZ7.5 (GLOVE) ×2 IMPLANT
GLOVE BIOGEL PI IND STRL 7.0 (GLOVE) IMPLANT
GLOVE BIOGEL PI IND STRL 7.5 (GLOVE) IMPLANT
GLOVE BIOGEL PI IND STRL 8 (GLOVE) ×1 IMPLANT
GLOVE BIOGEL PI IND STRL 8.5 (GLOVE) ×1 IMPLANT
GLOVE BIOGEL PI INDICATOR 7.0 (GLOVE) ×2
GLOVE BIOGEL PI INDICATOR 7.5 (GLOVE) ×1
GLOVE BIOGEL PI INDICATOR 8 (GLOVE) ×1
GLOVE BIOGEL PI INDICATOR 8.5 (GLOVE) ×1
GLOVE ECLIPSE 6.5 STRL STRAW (GLOVE) ×2 IMPLANT
GLOVE SURG ORTHO 8.0 STRL STRW (GLOVE) ×2 IMPLANT
GLOVE SURG SS PI 7.0 STRL IVOR (GLOVE) ×2 IMPLANT
GOWN STRL REUS W/ TWL LRG LVL3 (GOWN DISPOSABLE) ×1 IMPLANT
GOWN STRL REUS W/TWL LRG LVL3 (GOWN DISPOSABLE) ×4
GOWN STRL REUS W/TWL XL LVL3 (GOWN DISPOSABLE) ×3 IMPLANT
K-WIRE .035X4 (WIRE) IMPLANT
NDL HYPO 25X1 1.5 SAFETY (NEEDLE) IMPLANT
NDL KEITH (NEEDLE) IMPLANT
NDL SAFETY ECLIPSE 18X1.5 (NEEDLE) IMPLANT
NEEDLE HYPO 18GX1.5 SHARP (NEEDLE)
NEEDLE HYPO 22GX1.5 SAFETY (NEEDLE) ×2 IMPLANT
NEEDLE HYPO 25X1 1.5 SAFETY (NEEDLE) IMPLANT
NEEDLE KEITH (NEEDLE) IMPLANT
NS IRRIG 1000ML POUR BTL (IV SOLUTION) ×2 IMPLANT
PACK BASIN DAY SURGERY FS (CUSTOM PROCEDURE TRAY) ×2 IMPLANT
PAD CAST 3X4 CTTN HI CHSV (CAST SUPPLIES) ×1 IMPLANT
PAD CAST 4YDX4 CTTN HI CHSV (CAST SUPPLIES) ×1 IMPLANT
PADDING CAST ABS 4INX4YD NS (CAST SUPPLIES) ×1
PADDING CAST ABS COTTON 4X4 ST (CAST SUPPLIES) ×1 IMPLANT
PADDING CAST COTTON 3X4 STRL (CAST SUPPLIES) ×2
PADDING CAST COTTON 4X4 STRL (CAST SUPPLIES) ×2
PASSER SUT SWANSON 36MM LOOP (INSTRUMENTS) IMPLANT
SLEEVE SCD COMPRESS KNEE MED (MISCELLANEOUS) ×2 IMPLANT
SLING ARM FOAM STRAP XLG (SOFTGOODS) ×2 IMPLANT
SPLINT FAST PLASTER 5X30 (CAST SUPPLIES) ×10
SPLINT PLASTER CAST FAST 5X30 (CAST SUPPLIES) IMPLANT
SPLINT PLASTER CAST XFAST 3X15 (CAST SUPPLIES) ×10 IMPLANT
SPLINT PLASTER XTRA FASTSET 3X (CAST SUPPLIES) ×10
STOCKINETTE 4X48 STRL (DRAPES) ×2 IMPLANT
SUT ETHIBOND 3-0 V-5 (SUTURE) IMPLANT
SUT ETHILON 3 0 PS 1 (SUTURE) ×2 IMPLANT
SUT ETHILON 4 0 PS 2 18 (SUTURE) ×2 IMPLANT
SUT FIBERWIRE 2-0 18 17.9 3/8 (SUTURE)
SUT FIBERWIRE 3-0 18 TAPR NDL (SUTURE)
SUT MERSILENE 2.0 SH NDLE (SUTURE) IMPLANT
SUT MERSILENE 4 0 P 3 (SUTURE) IMPLANT
SUT MERSILENE 6 0 P 1 (SUTURE) IMPLANT
SUT SILK 4 0 PS 2 (SUTURE) IMPLANT
SUT VIC AB 0 SH 27 (SUTURE) IMPLANT
SUT VIC AB 2-0 SH 27 (SUTURE) ×2
SUT VIC AB 2-0 SH 27XBRD (SUTURE) IMPLANT
SUT VICRYL 4-0 PS2 18IN ABS (SUTURE) IMPLANT
SUTURE FIBERWR 2-0 18 17.9 3/8 (SUTURE) IMPLANT
SUTURE FIBERWR 3-0 18 TAPR NDL (SUTURE) IMPLANT
SYR BULB 3OZ (MISCELLANEOUS) ×2 IMPLANT
SYR CONTROL 10ML LL (SYRINGE) ×1 IMPLANT
TOWEL OR 17X24 6PK STRL BLUE (TOWEL DISPOSABLE) ×4 IMPLANT
UNDERPAD 30X30 (UNDERPADS AND DIAPERS) ×2 IMPLANT

## 2015-12-29 NOTE — Discharge Instructions (Addendum)
Post Anesthesia Home Care Instructions  Activity: Get plenty of rest for the remainder of the day. A responsible adult should stay with you for 24 hours following the procedure.  For the next 24 hours, DO NOT: -Drive a car -Paediatric nurse -Drink alcoholic beverages -Take any medication unless instructed by your physician -Make any legal decisions or sign important papers.  Meals: Start with liquid foods such as gelatin or soup. Progress to regular foods as tolerated. Avoid greasy, spicy, heavy foods. If nausea and/or vomiting occur, drink only clear liquids until the nausea and/or vomiting subsides. Call your physician if vomiting continues.  Special Instructions/Symptoms: Your throat may feel dry or sore from the anesthesia or the breathing tube placed in your throat during surgery. If this causes discomfort, gargle with warm salt water. The discomfort should disappear within 24 hours.  If you had a scopolamine patch placed behind your ear for the management of post- operative nausea and/or vomiting:  1. The medication in the patch is effective for 72 hours, after which it should be removed.  Wrap patch in a tissue and discard in the trash. Wash hands thoroughly with soap and water. 2. You may remove the patch earlier than 72 hours if you experience unpleasant side effects which may include dry mouth, dizziness or visual disturbances. 3. Avoid touching the patch. Wash your hands with soap and water after contact with the patch.   Regional Anesthesia Blocks  1. Numbness or the inability to move the "blocked" extremity may last from 3-48 hours after placement. The length of time depends on the medication injected and your individual response to the medication. If the numbness is not going away after 48 hours, call your surgeon.  2. The extremity that is blocked will need to be protected until the numbness is gone and the  Strength has returned. Because you cannot feel it, you will need  to take extra care to avoid injury. Because it may be weak, you may have difficulty moving it or using it. You may not know what position it is in without looking at it while the block is in effect.  3. For blocks in the legs and feet, returning to weight bearing and walking needs to be done carefully. You will need to wait until the numbness is entirely gone and the strength has returned. You should be able to move your leg and foot normally before you try and bear weight or walk. You will need someone to be with you when you first try to ensure you do not fall and possibly risk injury.  4. Bruising and tenderness at the needle site are common side effects and will resolve in a few days.  5. Persistent numbness or new problems with movement should be communicated to the surgeon or the [place] [phone] [place] [phone]).    [facility] Center Instructions Hand Surgery  Wound Care: Keep your hand elevated above the level of your heart.  Do not allow it to dangle by your side.  Keep the dressing dry and do not remove it unless your doctor advises you to do so.  He will usually change it at the time of your post-op visit.  Moving your fingers is advised to stimulate circulation but will depend on the site of your surgery.  If you have a splint applied, your doctor will advise you regarding movement.  Activity: Do not drive or operate machinery today.  Rest today and then you may  return to your normal activity and work as indicated by your physician.  Diet:  Drink liquids today or eat a light diet.  You may resume a regular diet tomorrow.    General expectations: Pain for two to three days. Fingers may become slightly swollen.  Call your doctor if any of the following occur: Severe pain not relieved by pain medication. Elevated temperature. Dressing soaked with blood. Inability to move fingers. White or bluish color to fingers.    Post Anesthesia  Home Care Instructions  Activity: Get plenty of rest for the remainder of the day. A responsible adult should stay with you for 24 hours following the procedure.  For the next 24 hours, DO NOT: -Drive a car -Paediatric nurse -Drink alcoholic beverages -Take any medication unless instructed by your physician -Make any legal decisions or sign important papers.  Meals: Start with liquid foods such as gelatin or soup. Progress to regular foods as tolerated. Avoid greasy, spicy, heavy foods. If nausea and/or vomiting occur, drink only clear liquids until the nausea and/or vomiting subsides. Call your physician if vomiting continues.  Special Instructions/Symptoms: Your throat may feel dry or sore from the anesthesia or the breathing tube placed in your throat during surgery. If this causes discomfort, gargle with warm salt water. The discomfort should disappear within 24 hours.  If you had a scopolamine patch placed behind your ear for the management of post- operative nausea and/or vomiting:  1. The medication in the patch is effective for 72 hours, after which it should be removed.  Wrap patch in a tissue and discard in the trash. Wash hands thoroughly with soap and water. 2. You may remove the patch earlier than 72 hours if you experience unpleasant side effects which may include dry mouth, dizziness or visual disturbances. 3. Avoid touching the patch. Wash your hands with soap and water after contact with the patch.   Regional Anesthesia Blocks  1. Numbness or the inability to move the "blocked" extremity may last from 3-48 hours after placement. The length of time depends on the medication injected and your individual response to the medication. If the numbness is not going away after 48 hours, call your surgeon.  2. The extremity that is blocked will need to be protected until the numbness is gone and the  Strength has returned. Because you cannot feel it, you will need to take extra  care to avoid injury. Because it may be weak, you may have difficulty moving it or using it. You may not know what position it is in without looking at it while the block is in effect.  3. For blocks in the legs and feet, returning to weight bearing and walking needs to be done carefully. You will need to wait until the numbness is entirely gone and the strength has returned. You should be able to move your leg and foot normally before you try and bear weight or walk. You will need someone to be with you when you first try to ensure you do not fall and possibly risk injury.  4. Bruising and tenderness at the needle site are common side effects and will resolve in a few days.  5. Persistent numbness or new problems with movement should be communicated to the surgeon or the [place] [phone] [place] [phone]).

## 2015-12-29 NOTE — Op Note (Signed)
Sean Hill, Sean Hill            ACCOUNT NO.:  [id number]  MEDICAL RECORD NO.:  [id number]  LOCATION:                                 FACILITY:  PHYSICIAN:  Leanora Cover, MD        DATE OF BIRTH:  1941-05-08  DATE OF PROCEDURE:  12/29/2015 DATE OF DISCHARGE:                              OPERATIVE REPORT   PREOPERATIVE DIAGNOSIS:  Right lateral ulnar collateral ligament tear with extensor muscle origin tear.  POSTOPERATIVE DIAGNOSIS:  Right lateral ulnar collateral ligament tear with extensor muscle origin tear.  PROCEDURE:   1. Right elbow lateral ulnar collateral ligament repair 2. Right elbow extensor muscle origin repair.  SURGEON:  Leanora Cover, MD.  ASSISTANT:  Daryll Brod, MD.  ANESTHESIA:  General with regional.  IV FLUIDS:  Per anesthesia flow sheet.  ESTIMATED BLOOD LOSS:  Minimal.  COMPLICATIONS:  None.  SPECIMENS:  None.  TIME OF TOURNIQUET:  53 minutes.  DISPOSITION:  Stable to PACU.  INDICATIONS:  Sean Hill is a 75 year old male, who sustained an injury from a fall 2 months ago.  He underwent open reduction and internal fixation of the distal radius fracture and closed reduction of an elbow dislocation at that time.  He has noted continued instability of the elbow.  MRI showed avulsion of the lateral collateral ligament origin at the distal humerus as well as avulsion of the extensor muscle origin.  We discussed treatment options including continued nonoperative care versus operative repair versus reconstruction.  Risks, benefits, and alternatives of surgery were discussed including risk of blood loss; infection; damage to nerves, vessels, tendons, ligaments, bone; failure of surgery; need for additional surgery; complications with wound healing; continued pain; and continued instability.  He voiced understanding of these risks and elected to proceed.  OPERATIVE COURSE:  After being identified preoperatively by myself, the patient and I agreed  upon procedure and site of procedure.  The surgical site was marked.  The risks, benefits, and alternatives of surgery were reviewed and he wished to proceed.  Surgical consent had been signed. He was given IV Ancef as preoperative antibiotic prophylaxis.  He was transferred to the operating room and placed on the operating table in supine position with the right upper extremity on arm board.  General anesthesia was induced by Anesthesiology.  A regional block had been performed by Anesthesia in preoperative holding.  Right upper extremity was prepped and draped in normal sterile orthopedic fashion.  Surgical pause was performed between surgeons, anesthesia, and operating room staff, and all were in agreement as to the patient, procedure, and site of procedure.  Tourniquet at the proximal aspect of the extremity was inflated to 250 mmHg after exsanguination of the limb with an Esmarch bandage.  Incision was made over the radiocapitellar joint, carried into subcutaneous tissues by spreading technique.  Bipolar electrocautery was used to obtain hemostasis.  The muscle origin was split.  The joint was easily identified.  There was avulsion of the lateral ulnar collateral ligament from the origin at the distal humerus.  The extensor muscle origin had also been avulsed.  The joint was inspected.  There was an impaction fracture of the posterior aspect of the  capitellum.  No other fractures or joint damage was noted.  The origin of the lateral ulnar collateral ligament was identified.  There was a bone fragment stuck to this, which was removed.  The origin was able to be brought back to the bony origin.  C-arm was used in lateral projections to ensure good position of the origin as well as reduction of the joint.  There was no subluxation.  Two JuggerKnot suture anchors were used.  One was placed at the lateral ulnar collateral ligament origin and 1 over the extensor muscle origin.  These were  used to repair the lateral ulnar collateral ligament back to its origin and to repair the extensor muscle origin back to the humerus.  The suture was then used to run the incision that had been made in the extensor muscle origin for visualization.  2-0 Vicryl was used in inverted interrupted fashion for the subcutaneous tissues, and skin was closed with 3-0 nylon in horizontal mattress fashion.  10 mL of 0.25% plain Marcaine was used in the wound to augment postoperative analgesia.  The wound was dressed with sterile Xeroform, 4x4s, and ABD and wrapped with a Kerlix bandage.  A posterior splint with a side bar was placed with the elbow at 90 degrees and the forearm in pronation.  This was wrapped with Ace bandage.  Tourniquet was deflated at 53 minutes.  Fingertips were pink with brisk capillary refill after deflation of the tourniquet.  Operative drapes were broken down.  The patient was awoken from anesthesia safely.  He was transferred back to stretcher and taken to PACU in stable condition.  I will see him back in the office in 1 week for postoperative followup.  I will give him Percocet 5/325, 1-2 p.o. q.6 hours p.r.n. pain, dispense #40.     Leanora Cover, MD     KK/MEDQ  D:  12/29/2015  T:  12/29/2015  Job:  [id number]

## 2015-12-29 NOTE — H&P (Signed)
MOUSSA KOWALCHUK is an 75 y.o. male.   Chief Complaint: right elbow instability HPI: 75 yo male 2 months s/p orif distal radius fracture and closed reduction of elbow dislocation with continued instability of elbow.  MRI shows tear of LUCL and extensor origin.  He wishes to have LUCL repair vs reconstruction.  Allergies:  Allergies  Allergen Reactions  . Codeine Shortness Of Breath  . Penicillins Hives, Rash and Other (See Comments)    Watery blisters Has patient had a PCN reaction causing immediate rash, facial/tongue/throat swelling, SOB or lightheadedness with hypotension: No Has patient had a PCN reaction causing severe rash involving mucus membranes or skin necrosis: No Has patient had a PCN reaction that required hospitalization No Has patient had a PCN reaction occurring within the last 10 years: No If all of the above answers are "NO", then may proceed with Cephalosporin use.     Past Medical History  Diagnosis Date  . Bipolar disorder [facility])     Has psychiatrist.  BH admission (suicidal) 07/20/11.  Marland Kitchen Hyperlipemia     Intol of meds: GI side effects  . 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  No hx of MRSA infection.  . 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 Nephrolithiasis   . GERD (gastroesophageal reflux disease)   . Inguinal hernia 02/25/11    Left sided hernia with fat  . Hip pain, left 2011-2012    Left hip and groin: MRI pelvis and left hip 02/18/11 showed NORMAL hip, with small inguinal hernia containing fat and sigmoid diverticulosis.  Hip pain presumably referred pain from hernia/diverticular dz??.  . Cervical spondylosis     Primarily C4-5 and C5-6--referred to [facility] 02/2011.  . Depression   . Bipolar disorder, current episode depressed, moderate ([place])   . Overdose of benzodiazepine 04/30/2013    Status post  . Hypertension      no meds now  . Dysrhythmia 10-29-15     new LBBB   . Asthma     as child, no problems now  . Sleep apnea     states he has never had sleep study and only wife states he has OSA    Past Surgical History  Procedure Laterality Date  . 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 Ankle fracture surgery      hardware still in both ankles (titanium)  . Inguinal hernia repair      Right side with mesh about 1993  . Elbow surgery      left---tendon  . Ankle surgery      Ankle tendon surgery  . Tonsillectomy    . Appendectomy    . Open reduction internal fixation (orif) distal radial fracture Right 10/29/2015    Procedure: OPEN REDUCTION INTERNAL FIXATION (ORIF) DISTAL RADIAL FRACTURE;  Surgeon: Leanora Cover, MD;  Location: [facility];  Service: Orthopedics;  Laterality: Right;    Family History: Family History  Problem Relation Age of Onset  . Alcohol abuse Mother   . Alcohol abuse Father   . Depression Daughter   . Paranoid behavior Daughter     Social History:   reports that he has never smoked. He does not have any smokeless tobacco history on file. He reports that he drinks alcohol. He reports that he does not use illicit drugs.  Medications: Medications Prior to Admission  Medication Sig  Dispense Refill  . ARIPiprazole (ABILIFY MAINTENA) 400 MG SUSR Inject 400 mg into the muscle every 30 (thirty) days. 1 each 2  . aspirin 81 MG tablet Take 81 mg by mouth daily.    . benztropine (COGENTIN) 1 MG tablet Take 1 tablet (1 mg total) by mouth daily. 30 tablet 2  . escitalopram (LEXAPRO) 20 MG tablet Take 1 tablet (20 mg total) by mouth daily. 30 tablet 2  . Flaxseed, Linseed, (FLAXSEED OIL PO) Take 1 tablet by mouth daily.    . Multiple Vitamin (MULTIVITAMIN WITH MINERALS) TABS tablet Take 1 tablet by mouth daily.    Marland Kitchen omeprazole (PRILOSEC) 40 MG capsule TAKE ONE CAPSULE BY MOUTH TWICE DAILY (Patient taking differently: TAKE ONE CAPSULE BY MOUTH every morning) 60 capsule 5  . oxymetazoline (AFRIN) 0.05 %  nasal spray Place 1 spray into both nostrils 2 (two) times daily as needed for congestion.     . temazepam (RESTORIL) 15 MG capsule Take 1 capsule (15 mg total) by mouth at bedtime as needed. for sleep 30 capsule 2  . traZODone (DESYREL) 100 MG tablet Take 1 tablet (100 mg total) by mouth at bedtime. 30 tablet 2  . Vitamin D, Cholecalciferol, 1000 UNITS TABS Take 1 tablet by mouth daily.      No results found for this or any previous visit (from the past 48 hour(s)).  No results found.   A comprehensive review of systems was negative.  Blood pressure 147/85, pulse 127, temperature 98.5 F (36.9 C), temperature source Oral, resp. rate 15, height 5\' 10"  (1.778 m), weight 116.121 kg (256 lb), SpO2 96 %.  General appearance: alert, cooperative and appears stated age Head: Normocephalic, without obvious abnormality, atraumatic Neck: supple, symmetrical, trachea midline Resp: clear to auscultation bilaterally Cardio: regular rate and rhythm GI: non-tender Extremities: Intact sensation and capillary refill all digits.  +epl/fpl/io.  No wounds. Pulses: 2+ and symmetric Skin: Skin color, texture, turgor normal. No rashes or lesions Neurologic: Grossly normal Incision/Wound:none  Assessment/Plan Right elbow LUCL tear.  Plan right LUCL repair vs reconstruction.  Non operative and operative treatment options were discussed with the patient and patient wishes to proceed with operative treatment. Risks, benefits, and alternatives of surgery were discussed and the patient agrees with the plan of care.   Arianis Bowditch R 12/29/2015, 1:16 PM

## 2015-12-29 NOTE — Transfer of Care (Signed)
Immediate Anesthesia Transfer of Care Note  Patient: Sean Hill  Procedure(s) Performed: Procedure(s): REPAIR  RIGHT LATERAL ULNAR COLLATERAL LIGAMENT TEAR  EXTENSOR ORIGIN  (Right)  Patient Location: PACU  Anesthesia Type:GA combined with regional for post-op pain  Level of Consciousness: awake, alert  and patient cooperative  Airway & Oxygen Therapy: Patient Spontanous Breathing and Patient connected to face mask oxygen  Post-op Assessment: Report given to RN, Post -op Vital signs reviewed and stable and Patient moving all extremities  Post vital signs: Reviewed and stable  Last Vitals:  Filed Vitals:   12/29/15 1300 12/29/15 1502  BP: 147/85 150/84  Pulse: 127   Temp:    Resp: 15     Complications: No apparent anesthesia complications

## 2015-12-29 NOTE — Anesthesia Postprocedure Evaluation (Signed)
Anesthesia Post Note  Patient: Sean Hill  Procedure(s) Performed: Procedure(s) (LRB): REPAIR  RIGHT LATERAL ULNAR COLLATERAL LIGAMENT TEAR  EXTENSOR ORIGIN  (Right)  Patient location during evaluation: PACU Anesthesia Type: General Level of consciousness: awake Pain management: pain level controlled Vital Signs Assessment: post-procedure vital signs reviewed and stable Respiratory status: spontaneous breathing Cardiovascular status: stable Anesthetic complications: no    Last Vitals:  Filed Vitals:   12/29/15 1515 12/29/15 1530  BP: 144/90 159/86  Pulse: 97 93  Temp:    Resp: 19 16    Last Pain:  Filed Vitals:   12/29/15 1533  PainSc: 0-No pain                 EDWARDS,Martell Mcfadyen

## 2015-12-29 NOTE — Brief Op Note (Signed)
12/29/2015  2:56 PM  PATIENT:  Sean Hill  75 y.o. male  PRE-OPERATIVE DIAGNOSIS:  RIGHT LATERAL ULNAR COLLATERAL LIGAMENT TEAR, exensor MUSCLE ORIGIN TEAR   POST-OPERATIVE DIAGNOSIS:  RIGHT LATERAL ULNAR COLLATERAL LIGAMENT TEAR, extensor MUSCLE ORIGIN TEAR   PROCEDURE:  Procedure(s): REPAIR  RIGHT LATERAL ULNAR COLLATERAL LIGAMENT TEAR  EXTENSOR ORIGIN  (Right)  SURGEON:  Surgeon(s) and Role:    * Leanora Cover, MD - Primary    * Daryll Brod, MD - Assisting  PHYSICIAN ASSISTANT:   ASSISTANTS: Daryll Brod, MD   ANESTHESIA:   regional and general  EBL:  Total I/O In: 1600 [I.V.:1600] Out: 10 [Blood:10]  BLOOD ADMINISTERED:none  DRAINS: none   LOCAL MEDICATIONS USED:  MARCAINE     SPECIMEN:  No Specimen  DISPOSITION OF SPECIMEN:  N/A  COUNTS:  YES  TOURNIQUET:  Right arm: 53 minutes at 250 mmHg  DICTATION: .Other Dictation: Dictation Number [id number]  PLAN OF CARE: Discharge to home after PACU  PATIENT DISPOSITION:  PACU - hemodynamically stable.

## 2015-12-29 NOTE — Op Note (Signed)
Intra-operative fluoroscopic images in the AP, lateral, and oblique views were taken and evaluated by myself.  Reduction and hardware placement were confirmed.  There was no intraarticular penetration of permanent hardware.  

## 2015-12-29 NOTE — Op Note (Signed)
[id number] 

## 2015-12-29 NOTE — Anesthesia Preprocedure Evaluation (Signed)
Anesthesia Evaluation  Patient identified by MRN, date of birth, ID band Patient awake    Reviewed: Allergy & Precautions, NPO status , Patient's Chart, lab work & pertinent test results  Airway Mallampati: II  TM Distance: >3 FB Neck ROM: Full    Dental  (+) Teeth Intact, Dental Advisory Given   Pulmonary sleep apnea and Continuous Positive Airway Pressure Ventilation ,    breath sounds clear to auscultation       Cardiovascular hypertension, Pt. on medications  Rhythm:Regular Rate:Normal     Neuro/Psych    GI/Hepatic GERD  Medicated and Controlled,  Endo/Other  Morbid obesity  Renal/GU      Musculoskeletal   Abdominal   Peds  Hematology   Anesthesia Other Findings   Reproductive/Obstetrics                             Anesthesia Physical Anesthesia Plan  ASA: III  Anesthesia Plan: General   Post-op Pain Management: GA combined w/ Regional for post-op pain   Induction: Intravenous  Airway Management Planned: LMA  Additional Equipment:   Intra-op Plan:   Post-operative Plan: Extubation in OR  Informed Consent: I have reviewed the patients History and Physical, chart, labs and discussed the procedure including the risks, benefits and alternatives for the proposed anesthesia with the patient or authorized representative who has indicated his/her understanding and acceptance.   Dental advisory given  Plan Discussed with: CRNA, Anesthesiologist and Surgeon  Anesthesia Plan Comments:         Anesthesia Quick Evaluation

## 2015-12-29 NOTE — Progress Notes (Signed)
  Assisted Dr. Crews with left, ultrasound guided, supraclavicular block. Side rails up, monitors on throughout procedure. See vital signs in flow sheet. Tolerated Procedure well. 

## 2015-12-29 NOTE — Anesthesia Procedure Notes (Addendum)
Anesthesia Regional Block:  Supraclavicular block  Pre-Anesthetic Checklist: ,, timeout performed, Correct Patient, Correct Site, Correct Laterality, Correct Procedure, Correct Position, site marked, Risks and benefits discussed,  Surgical consent,  Pre-op evaluation,  At surgeon's request and post-op pain management  Laterality: Right and Upper  Prep: chloraprep       Needles:  Injection technique: Single-shot  Needle Type: Echogenic Stimulator Needle     Needle Length: 5cm 5 cm Needle Gauge: 21 and 21 G    Additional Needles:  Procedures: ultrasound guided (picture in chart) Supraclavicular block Narrative:  Start time: 12/29/2015 12:37 PM End time: 12/29/2015 12:42 PM Injection made incrementally with aspirations every 5 mL.  Performed by: Personally  Anesthesiologist: CREWS, DAVID   Procedure Name: LMA Insertion Date/Time: 12/29/2015 1:37 PM Performed by: Baxter Flattery Pre-anesthesia Checklist: Patient identified, Emergency Drugs available, Suction available and Patient being monitored Patient Re-evaluated:Patient Re-evaluated prior to inductionOxygen Delivery Method: Circle System Utilized Preoxygenation: Pre-oxygenation with 100% oxygen Intubation Type: IV induction Ventilation: Mask ventilation without difficulty LMA: LMA inserted LMA Size: 5.0 Number of attempts: 1 Airway Equipment and Method: Bite block Placement Confirmation: positive ETCO2 and breath sounds checked- equal and bilateral Tube secured with: Tape Dental Injury: Teeth and Oropharynx as per pre-operative assessment       Right Supraclavicular Block image

## 2015-12-29 NOTE — Op Note (Signed)
I assisted Surgeon(s) and Role:    * Leanora Cover, MD - Primary    * Daryll Brod, MD - Assisting on the Procedure(s): REPAIR  RIGHT LATERAL ULNAR COLLATERAL LIGAMENT TEAR  EXTENSOR ORIGIN  on 12/29/2015.  I provided assistance on this case as follows: approach, retraction, arthrotomy of the joint, debridement of the joint. Identification of the avulsed ligament. Placement of anchors, reduction and stabilization of the joint during ligament and extensor origin repair, closure of the wound and application of dressings and splints. I was present for the entire procedure.  Electronically signed by: Wynonia Sours, MD Date: 12/29/2015 Time: 3:01 PM

## 2015-12-30 ENCOUNTER — Encounter (HOSPITAL_BASED_OUTPATIENT_CLINIC_OR_DEPARTMENT_OTHER): Payer: Self-pay | Admitting: Orthopedic Surgery

## 2016-01-04 ENCOUNTER — Other Ambulatory Visit: Payer: Self-pay | Admitting: Internal Medicine

## 2016-01-05 DIAGNOSIS — M25521 Pain in right elbow: Secondary | ICD-10-CM | POA: Diagnosis not present

## 2016-01-05 DIAGNOSIS — S53441D Ulnar collateral ligament sprain of right elbow, subsequent encounter: Secondary | ICD-10-CM | POA: Diagnosis not present

## 2016-01-05 DIAGNOSIS — M25621 Stiffness of right elbow, not elsewhere classified: Secondary | ICD-10-CM | POA: Diagnosis not present

## 2016-01-08 DIAGNOSIS — F319 Bipolar disorder, unspecified: Secondary | ICD-10-CM | POA: Diagnosis not present

## 2016-01-08 DIAGNOSIS — F411 Generalized anxiety disorder: Secondary | ICD-10-CM | POA: Diagnosis not present

## 2016-01-11 ENCOUNTER — Ambulatory Visit (HOSPITAL_COMMUNITY): Payer: Self-pay | Admitting: Psychiatry

## 2016-01-12 DIAGNOSIS — S53441D Ulnar collateral ligament sprain of right elbow, subsequent encounter: Secondary | ICD-10-CM | POA: Diagnosis not present

## 2016-01-14 ENCOUNTER — Ambulatory Visit (HOSPITAL_COMMUNITY): Payer: Self-pay | Admitting: Psychiatry

## 2016-01-20 ENCOUNTER — Ambulatory Visit (INDEPENDENT_AMBULATORY_CARE_PROVIDER_SITE_OTHER): Payer: PPO | Admitting: Psychiatry

## 2016-01-20 ENCOUNTER — Encounter (HOSPITAL_COMMUNITY): Payer: Self-pay | Admitting: Psychiatry

## 2016-01-20 ENCOUNTER — Ambulatory Visit (HOSPITAL_BASED_OUTPATIENT_CLINIC_OR_DEPARTMENT_OTHER): Payer: PPO | Admitting: *Deleted

## 2016-01-20 VITALS — BP 146/82 | HR 85 | Wt 256.4 lb

## 2016-01-20 DIAGNOSIS — F319 Bipolar disorder, unspecified: Secondary | ICD-10-CM

## 2016-01-20 DIAGNOSIS — F3110 Bipolar disorder, current episode manic without psychotic features, unspecified: Secondary | ICD-10-CM

## 2016-01-20 DIAGNOSIS — F3131 Bipolar disorder, current episode depressed, mild: Secondary | ICD-10-CM

## 2016-01-20 MED ORDER — TEMAZEPAM 15 MG PO CAPS: 15.0000 mg | ORAL_CAPSULE | Freq: Every evening | ORAL | Status: DC | PRN

## 2016-01-20 MED ORDER — ARIPIPRAZOLE ER 400 MG IM SUSR: 400.0000 mg | INTRAMUSCULAR | Status: DC

## 2016-01-20 MED ORDER — ESCITALOPRAM OXALATE 20 MG PO TABS: 20.0000 mg | ORAL_TABLET | Freq: Every day | ORAL | Status: DC

## 2016-01-20 MED ORDER — TRAZODONE HCL 100 MG PO TABS: 100.0000 mg | ORAL_TABLET | Freq: Every day | ORAL | Status: DC

## 2016-01-20 NOTE — Progress Notes (Signed)
Patient ID: Sean Hill, male   DOB: 1941-01-26, 75 y.o.   MRN: [id number] Patient came for IM injection of Abilify Maintena 400mg . Pt had no complaints and tolerated injection without complaint. States that "it didn't hurt a bit." Given in left upper outer quadrant. Medication supplied by [facility] pharmacy as sample. Lot [id number] Expiration July 2019.

## 2016-01-20 NOTE — Progress Notes (Signed)
[name] [phone] Progress Note  Sean Hill:[id number] 75 y.o.  01/20/2016 4:06 PM  Chief Complaint:  Medication management and followup.              History of Present Illness: Sean Hill came for his followup appointment.  Sean Hill is taking his medication as prescribed.  In February Sean Hill fell and broke his arm. And Sean Hill has a cast on his right arm.  Sean Hill is hoping to come off from the cast in 4 weeks.  Sean Hill admitted Sean Hill was feeling sad because Sean Hill could not do a lot of things but denies any crying spells, hopelessness or any suicidal thoughts.  Sean Hill continued to see [facility] for counseling.  Sean Hill is getting Abilify injection every 4 weeks.  Sean Hill reported no side effects.  His dry mouth and constipation is less severe and less intense.  Sean Hill sleeping good.  Sean Hill has no tremors or shakes.  Sean Hill wants to continue temazepam, Abilify, trazodone, Lexapro and Cogentin.  Patient denies drinking alcohol or using any illegal substances.  Sean Hill lives with his wife who is very supportive.  His appetite is okay.  Energy level is good.  His vitals are stable.  Suicidal Ideation: No Plan Formed: No Patient has means to carry out plan: No  Homicidal Ideation: No Plan Formed: No Patient has means to carry out plan: No  Review of Systems  Constitutional: Negative for weight loss and malaise/fatigue.  Musculoskeletal: Negative.   Skin: Negative for itching and rash.  Neurological: Negative for dizziness, tremors and headaches.    Psychiatric: Agitation: No Hallucination: No Depressed Mood: No Insomnia: No Hypersomnia: No Altered Concentration: No Feels Worthless: No Grandiose Ideas: No Belief In Special Powers: No New/Increased Substance Abuse: No Compulsions: No  Neurologic: Headache: No Seizure: No Paresthesias: No  Medical History:  Patient has a history of high triglycerides, irritable bowel syndrome, chronic pain, GERD.  His primary care physician is Dr. Shawna Orleans.  Psychosocial history.  Patient is  born and raised in [place]. His been married once. Sean Hill has 2 daughters. Patient lives with his wife. His daughter lives close by. Sean Hill has good relationship with his daughter.  Past Psychiatric History/Hospitalization(s): Patient has at least 4-5 psychiatric hospitalization.  His last psychiatric admission was in July 2014 .  Sean Hill has a history of taking overdose on his medication and mixing with alcohol. Sean Hill has a history of cutting his wrist.  Sean Hill has taken Tegretol, Depakote, Ambien, Remeron, Vistaril , Geodon, Abilify, lithium, Provigil, Zoloft, Neurontin , Lamictal, Wellbutrin , Risperdal and Prstiq.  Sean Hill had overdose on Latuda. Patient endorsed history of mania and severe impulsive behavior. Sean Hill admitted history of buying unnecessary, getting speeding tickets and aggression.  Patient reported history of side effects due to polypharmacy.   Anxiety: Yes Bipolar Disorder: Yes Depression: Yes Mania: Yes Psychosis: Yes Schizophrenia: No Personality Disorder: No Hospitalization for psychiatric illness: Yes History of Electroconvulsive Shock Therapy: No Prior Suicide Attempts: Yes  Physical Exam: Constitutional:  BP 146/82 mmHg  Pulse 85  Wt 256 lb 6.4 oz (116.302 kg)   Recent Results (from the past 2160 hour(s))  CBC with Differential/Platelet     Status: None   Collection Time: 10/29/15  9:13 AM  Result Value Ref Range   WBC 4.9 4.0 - 10.5 K/uL   RBC 5.32 4.22 - 5.81 MIL/uL   Hemoglobin 15.6 13.0 - 17.0 g/dL   HCT 45.2 39.0 - 52.0 %   MCV 85.0 78.0 - 100.0 fL  MCH 29.3 26.0 - 34.0 pg   MCHC 34.5 30.0 - 36.0 g/dL   RDW 14.2 11.5 - 15.5 %   Platelets 197 150 - 400 K/uL   Neutrophils Relative % 45 %   Neutro Abs 2.2 1.7 - 7.7 K/uL   Lymphocytes Relative 44 %   Lymphs Abs 2.2 0.7 - 4.0 K/uL   Monocytes Relative 8 %   Monocytes Absolute 0.4 0.1 - 1.0 K/uL   Eosinophils Relative 2 %   Eosinophils Absolute 0.1 0.0 - 0.7 K/uL   Basophils Relative 1 %   Basophils Absolute 0.1  0.0 - 0.1 K/uL  I-stat chem 8, ed     Status: Abnormal   Collection Time: 10/29/15  9:20 AM  Result Value Ref Range   Sodium 141 135 - 145 mmol/L   Potassium 3.7 3.5 - 5.1 mmol/L   Chloride 105 101 - 111 mmol/L   BUN 14 6 - 20 mg/dL   Creatinine, Ser 0.90 0.61 - 1.24 mg/dL   Glucose, Bld 165 (H) 65 - 99 mg/dL   Calcium, Ion 1.15 1.13 - 1.30 mmol/L   TCO2 24 0 - 100 mmol/L   Hemoglobin 16.3 13.0 - 17.0 g/dL   HCT 48.0 39.0 - 52.0 %    General Appearance: well nourished and obese  Musculoskeletal: Strength & Muscle Tone: within normal limits Gait & Station: normal Patient leans: Patient has normal posture  Mental status examination Patient is casually dressed and fairly groomed.  Sean Hill is pleasant and cooperative.  Sean Hill maintained good eye contact.  Sean Hill has a cast on his right arm.  Sean Hill described his mood good and his affect is okay. Sean Hill denies any active or passive suicidal parts or homicidal thought.  There were no paranoia or delusions.  His psychomotor activity is normal.  His speech is slow but clear and coherent.  His fund of knowledge is good.  His thought process is slow but logical and goal directed.  Sean Hill denies any auditory or visual hallucination.  There were no delusions or any paranoia.  His attention and concentration is fair.  Sean Hill is alert and oriented x3.  His memory is good.  His insight judgment and impulse control is okay.  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 Left leg swelling 01/06/2015  . Change in bowel habits 01/06/2015  . Gastroesophageal reflux disease 11/28/2011  . Bipolar disorder ([place]) 11/25/2011    Plan:  Patient is a stable on his current  psychiatric medication.  I will continue Abilify injection 400 mg IM every 4 weeks, Cogentin 1 mg at bedtime, temazepam 15 mg at bedtime, trazodone 100 mg  half to one tablet at bedtime and Lexapro 20 mg daily.  Discussed medication side effects and benefits.  Encouraged to keep appointment with [facility] for counseling.  Follow-up in 3 months.  Dennard Vezina T., MD 01/20/2016

## 2016-01-21 ENCOUNTER — Other Ambulatory Visit (HOSPITAL_COMMUNITY): Payer: Self-pay | Admitting: Psychiatry

## 2016-01-25 ENCOUNTER — Other Ambulatory Visit (HOSPITAL_COMMUNITY): Payer: Self-pay | Admitting: Psychiatry

## 2016-01-26 ENCOUNTER — Other Ambulatory Visit (HOSPITAL_COMMUNITY): Payer: Self-pay | Admitting: Psychiatry

## 2016-01-26 ENCOUNTER — Telehealth (HOSPITAL_COMMUNITY): Payer: Self-pay

## 2016-01-26 NOTE — Telephone Encounter (Signed)
Sent in by Dr. Adele Schilder

## 2016-01-26 NOTE — Telephone Encounter (Signed)
When patient came in on 5/3, his medications were refilled. His Benztropine was not renewed, but it looks like you want him to continue on this medication. Is it okay to send in the refill? Please review and advise, thank you

## 2016-01-27 ENCOUNTER — Telehealth (HOSPITAL_COMMUNITY): Payer: Self-pay | Admitting: *Deleted

## 2016-01-27 NOTE — Telephone Encounter (Signed)
Prior authorization for Benztropine received. Submitted online with cover my meds. Awaiting response.

## 2016-01-29 ENCOUNTER — Telehealth (HOSPITAL_COMMUNITY): Payer: Self-pay

## 2016-01-29 NOTE — Telephone Encounter (Signed)
Fax received from patients insurance company, prior authorization for his Benstropine Mes 1 mg tablet. Authorization good from 01/28/2016 to 09/18/2016. There was not a reference number on the fax.

## 2016-02-02 DIAGNOSIS — S52571D Other intraarticular fracture of lower end of right radius, subsequent encounter for closed fracture with routine healing: Secondary | ICD-10-CM | POA: Diagnosis not present

## 2016-02-02 DIAGNOSIS — S53441D Ulnar collateral ligament sprain of right elbow, subsequent encounter: Secondary | ICD-10-CM | POA: Diagnosis not present

## 2016-02-03 DIAGNOSIS — S53441D Ulnar collateral ligament sprain of right elbow, subsequent encounter: Secondary | ICD-10-CM | POA: Diagnosis not present

## 2016-02-03 DIAGNOSIS — M25621 Stiffness of right elbow, not elsewhere classified: Secondary | ICD-10-CM | POA: Diagnosis not present

## 2016-02-03 DIAGNOSIS — M25521 Pain in right elbow: Secondary | ICD-10-CM | POA: Diagnosis not present

## 2016-02-03 DIAGNOSIS — R29898 Other symptoms and signs involving the musculoskeletal system: Secondary | ICD-10-CM | POA: Diagnosis not present

## 2016-02-09 DIAGNOSIS — F411 Generalized anxiety disorder: Secondary | ICD-10-CM | POA: Diagnosis not present

## 2016-02-09 DIAGNOSIS — F319 Bipolar disorder, unspecified: Secondary | ICD-10-CM | POA: Diagnosis not present

## 2016-02-10 DIAGNOSIS — M25621 Stiffness of right elbow, not elsewhere classified: Secondary | ICD-10-CM | POA: Diagnosis not present

## 2016-02-10 DIAGNOSIS — M25521 Pain in right elbow: Secondary | ICD-10-CM | POA: Diagnosis not present

## 2016-02-10 DIAGNOSIS — R29898 Other symptoms and signs involving the musculoskeletal system: Secondary | ICD-10-CM | POA: Diagnosis not present

## 2016-02-10 DIAGNOSIS — S53441D Ulnar collateral ligament sprain of right elbow, subsequent encounter: Secondary | ICD-10-CM | POA: Diagnosis not present

## 2016-02-22 ENCOUNTER — Ambulatory Visit (INDEPENDENT_AMBULATORY_CARE_PROVIDER_SITE_OTHER): Payer: PPO

## 2016-02-22 DIAGNOSIS — F319 Bipolar disorder, unspecified: Secondary | ICD-10-CM

## 2016-02-22 MED ORDER — ARIPIPRAZOLE ER 400 MG IM SUSR
400.0000 mg | INTRAMUSCULAR | Status: DC
  Administered 2016-02-22: 400 mg via INTRAMUSCULAR

## 2016-02-22 NOTE — Progress Notes (Signed)
Patient ID: Sean Hill, male   DOB: 1940/11/30, 75 y.o.   MRN: [id number] Patient presents today with appropriate affect for his injection of Abilify Maintena 400 mg. Patient reports he is sleeping and eating well, denies any SI/HI and is not having any visual or auditory hallucinations. The injection of Abilify Maintena 400 mg was prepared as ordered and administered IM in patients Upper right outer gluteal quadrant. Patient tolerated the procedure well and agreed to return in 30 days.

## 2016-03-15 DIAGNOSIS — S52614E Nondisplaced fracture of right ulna styloid process, subsequent encounter for open fracture type I or II with routine healing: Secondary | ICD-10-CM | POA: Diagnosis not present

## 2016-03-15 DIAGNOSIS — S52571D Other intraarticular fracture of lower end of right radius, subsequent encounter for closed fracture with routine healing: Secondary | ICD-10-CM | POA: Diagnosis not present

## 2016-03-15 DIAGNOSIS — S53441D Ulnar collateral ligament sprain of right elbow, subsequent encounter: Secondary | ICD-10-CM | POA: Diagnosis not present

## 2016-03-15 DIAGNOSIS — F411 Generalized anxiety disorder: Secondary | ICD-10-CM | POA: Diagnosis not present

## 2016-03-15 DIAGNOSIS — S53104S Unspecified dislocation of right ulnohumeral joint, sequela: Secondary | ICD-10-CM | POA: Diagnosis not present

## 2016-03-15 DIAGNOSIS — F319 Bipolar disorder, unspecified: Secondary | ICD-10-CM | POA: Diagnosis not present

## 2016-03-23 ENCOUNTER — Ambulatory Visit (HOSPITAL_COMMUNITY): Payer: PPO

## 2016-03-23 DIAGNOSIS — F319 Bipolar disorder, unspecified: Secondary | ICD-10-CM

## 2016-03-23 MED ORDER — ARIPIPRAZOLE ER 400 MG IM SUSR
400.0000 mg | INTRAMUSCULAR | Status: DC
  Administered 2016-03-23: 400 mg via INTRAMUSCULAR

## 2016-03-23 NOTE — Progress Notes (Signed)
Patient ID: Sean Hill, male   DOB: [birth year], 75 y.o.   MRN: [id number] Patient presents today with appropriate affect for his injection of Abilify Maintena 400 mg. Patient reports he is sleeping and eating well, denies any SI/HI and is not having any visual or auditory hallucinations. The injection of Abilify Maintena 400 mg was prepared as ordered and administered IM in patients Upper left outer gluteal quadrant. Patient tolerated the procedure well and agreed to return in 30 days.

## 2016-04-03 ENCOUNTER — Other Ambulatory Visit (HOSPITAL_COMMUNITY): Payer: Self-pay | Admitting: Psychiatry

## 2016-04-13 DIAGNOSIS — M654 Radial styloid tenosynovitis [de Quervain]: Secondary | ICD-10-CM | POA: Diagnosis not present

## 2016-04-13 DIAGNOSIS — S6981XA Other specified injuries of right wrist, hand and finger(s), initial encounter: Secondary | ICD-10-CM | POA: Diagnosis not present

## 2016-04-14 DIAGNOSIS — F319 Bipolar disorder, unspecified: Secondary | ICD-10-CM | POA: Diagnosis not present

## 2016-04-14 DIAGNOSIS — F411 Generalized anxiety disorder: Secondary | ICD-10-CM | POA: Diagnosis not present

## 2016-04-15 ENCOUNTER — Other Ambulatory Visit: Payer: Self-pay | Admitting: Orthopedic Surgery

## 2016-04-15 DIAGNOSIS — S66801A Unspecified injury of other specified muscles, fascia and tendons at wrist and hand level, right hand, initial encounter: Secondary | ICD-10-CM

## 2016-04-19 ENCOUNTER — Other Ambulatory Visit (HOSPITAL_COMMUNITY): Payer: Self-pay

## 2016-04-19 DIAGNOSIS — F319 Bipolar disorder, unspecified: Secondary | ICD-10-CM

## 2016-04-19 MED ORDER — BENZTROPINE MESYLATE 1 MG PO TABS: 1.0000 mg | ORAL_TABLET | Freq: Every day | ORAL | 1 refills | Status: DC

## 2016-04-19 MED ORDER — TEMAZEPAM 15 MG PO CAPS: 15.0000 mg | ORAL_CAPSULE | Freq: Every evening | ORAL | 1 refills | Status: DC | PRN

## 2016-04-21 ENCOUNTER — Ambulatory Visit (HOSPITAL_COMMUNITY): Payer: Self-pay | Admitting: Psychiatry

## 2016-04-25 ENCOUNTER — Ambulatory Visit (INDEPENDENT_AMBULATORY_CARE_PROVIDER_SITE_OTHER): Payer: PPO

## 2016-04-25 ENCOUNTER — Telehealth (HOSPITAL_COMMUNITY): Payer: Self-pay

## 2016-04-25 DIAGNOSIS — F319 Bipolar disorder, unspecified: Secondary | ICD-10-CM

## 2016-04-25 MED ORDER — ARIPIPRAZOLE ER 400 MG IM SUSR
400.0000 mg | INTRAMUSCULAR | Status: DC
  Administered 2016-04-25 – 2016-06-23 (×3): 400 mg via INTRAMUSCULAR

## 2016-04-25 NOTE — Telephone Encounter (Signed)
Patient came in for his injection today and brought a bill with him. The bill was for his injection on 6/5. I called billing and they said they would send the bill for review. Patient was given a sample that day and can not be charged for the medication. I made sure that they had not billed him for his 7/5 injection, they had not.

## 2016-04-25 NOTE — Progress Notes (Signed)
Patient presents today with appropriate affect for his injection of Abilify Maintena 400 mg. Patient reports he is sleeping and eating well, denies any SI/HI and is not having any visual or auditory hallucinations. Patient brought a copy of the bill he received from last months injection, I told him I would discuss with billing. Patient was given a sample injection and should not have been charged for that.  The injection of Abilify Maintena 400 mg was prepared as ordered and administered IM in patients Upper right outer gluteal quadrant. Patient tolerated the procedure well and agreed to return in 30 days.

## 2016-04-27 ENCOUNTER — Ambulatory Visit
Admission: RE | Admit: 2016-04-27 | Discharge: 2016-04-27 | Disposition: A | Discharge status: Home / Self Care | Payer: PPO | Source: Ambulatory Visit | Attending: Orthopedic Surgery | Admitting: Orthopedic Surgery

## 2016-04-27 DIAGNOSIS — S66801A Unspecified injury of other specified muscles, fascia and tendons at wrist and hand level, right hand, initial encounter: Secondary | ICD-10-CM

## 2016-04-27 DIAGNOSIS — M25531 Pain in right wrist: Secondary | ICD-10-CM | POA: Diagnosis not present

## 2016-04-27 MED ORDER — IOPAMIDOL (ISOVUE-M 200) INJECTION 41%
5.0000 mL | Freq: Once | INTRAMUSCULAR | Status: AC
  Administered 2016-04-27: 5 mL via INTRA_ARTICULAR

## 2016-05-02 DIAGNOSIS — S6981XA Other specified injuries of right wrist, hand and finger(s), initial encounter: Secondary | ICD-10-CM | POA: Diagnosis not present

## 2016-05-02 DIAGNOSIS — S52571D Other intraarticular fracture of lower end of right radius, subsequent encounter for closed fracture with routine healing: Secondary | ICD-10-CM | POA: Diagnosis not present

## 2016-05-02 DIAGNOSIS — M19131 Post-traumatic osteoarthritis, right wrist: Secondary | ICD-10-CM | POA: Diagnosis not present

## 2016-05-02 DIAGNOSIS — M654 Radial styloid tenosynovitis [de Quervain]: Secondary | ICD-10-CM | POA: Diagnosis not present

## 2016-05-24 ENCOUNTER — Ambulatory Visit (INDEPENDENT_AMBULATORY_CARE_PROVIDER_SITE_OTHER): Payer: PPO

## 2016-05-24 ENCOUNTER — Other Ambulatory Visit: Payer: Self-pay | Admitting: Orthopedic Surgery

## 2016-05-24 DIAGNOSIS — F3131 Bipolar disorder, current episode depressed, mild: Secondary | ICD-10-CM

## 2016-05-24 DIAGNOSIS — S6981XA Other specified injuries of right wrist, hand and finger(s), initial encounter: Secondary | ICD-10-CM | POA: Diagnosis not present

## 2016-05-24 DIAGNOSIS — M19131 Post-traumatic osteoarthritis, right wrist: Secondary | ICD-10-CM | POA: Diagnosis not present

## 2016-05-24 DIAGNOSIS — F319 Bipolar disorder, unspecified: Secondary | ICD-10-CM | POA: Diagnosis not present

## 2016-05-24 MED ORDER — ARIPIPRAZOLE ER 400 MG IM SUSR: 400.0000 mg | INTRAMUSCULAR | 0 refills | Status: DC

## 2016-05-24 NOTE — Progress Notes (Signed)
Patient presented with appropriate affect, level mood and denied any current auditory or visual hallucinations.  Denies any racing thoughts or problems with mood.  Patient states he will have another surgery on his right wrist in the coming week due to ongoing problems with full range of motion.  Patient reported no current problems with medications and no other concerns.  Informed patient his bill for past medication had been worked out.  New order from [facility] for patient's ongoing use of Abilify Maintena injections every 30 days.  Medication drawn up and administered as ordered in patient's left out gluteal quadrant.  Patient tolerated injection without complaint of pain or discomfort and patient to return in 30 days for next due injection.  Patient reminded of scheduled evaluation with Dr. Adele Schilder set for 06/09/16 and patient to call if any problems prior to appointment.

## 2016-05-25 DIAGNOSIS — F319 Bipolar disorder, unspecified: Secondary | ICD-10-CM | POA: Diagnosis not present

## 2016-05-25 DIAGNOSIS — F411 Generalized anxiety disorder: Secondary | ICD-10-CM | POA: Diagnosis not present

## 2016-05-27 ENCOUNTER — Encounter (HOSPITAL_BASED_OUTPATIENT_CLINIC_OR_DEPARTMENT_OTHER): Payer: Self-pay | Admitting: *Deleted

## 2016-05-27 ENCOUNTER — Telehealth: Payer: Self-pay | Admitting: Internal Medicine

## 2016-05-27 NOTE — Progress Notes (Signed)
Patient chart and EKG shown to Dr Ola Spurr. States patient needs a cardiac workup for new LBBB. Alisha at Dr Levell July office notified.

## 2016-05-27 NOTE — Telephone Encounter (Signed)
errort

## 2016-05-30 ENCOUNTER — Telehealth: Payer: Self-pay | Admitting: Internal Medicine

## 2016-05-30 NOTE — Telephone Encounter (Signed)
Pt needs a referral to cardiologist for abnormal ekg. Pt said he discuss this with cory. Pt had to cancel his wrist surgery last week due to left bundle branch blockage

## 2016-05-30 NOTE — Telephone Encounter (Signed)
He needs to establish with myself or a new provider. He needs to be evaluated prior to having a referral done

## 2016-05-30 NOTE — Telephone Encounter (Signed)
Please advise 

## 2016-05-30 NOTE — Telephone Encounter (Signed)
Pt has an appt on 06-07-16

## 2016-06-01 ENCOUNTER — Other Ambulatory Visit (HOSPITAL_COMMUNITY): Payer: Self-pay | Admitting: Psychiatry

## 2016-06-02 ENCOUNTER — Encounter (HOSPITAL_BASED_OUTPATIENT_CLINIC_OR_DEPARTMENT_OTHER): Payer: Self-pay

## 2016-06-02 ENCOUNTER — Ambulatory Visit (HOSPITAL_BASED_OUTPATIENT_CLINIC_OR_DEPARTMENT_OTHER): Admit: 2016-06-02 | Payer: PPO | Admitting: Orthopedic Surgery

## 2016-06-02 SURGERY — WRIST ARTHROSCOPY WITH FOVEAL TRIANGULAR FIBROCARTILAGE COMPLEX REPAIR
Anesthesia: Choice | Laterality: Right

## 2016-06-07 ENCOUNTER — Ambulatory Visit (INDEPENDENT_AMBULATORY_CARE_PROVIDER_SITE_OTHER): Payer: PPO | Admitting: Adult Health

## 2016-06-07 ENCOUNTER — Encounter: Payer: Self-pay | Admitting: Adult Health

## 2016-06-07 VITALS — BP 134/82 | Temp 98.6°F | Ht 69.0 in | Wt 260.8 lb

## 2016-06-07 DIAGNOSIS — I447 Left bundle-branch block, unspecified: Secondary | ICD-10-CM | POA: Diagnosis not present

## 2016-06-07 DIAGNOSIS — Z7689 Persons encountering health services in other specified circumstances: Secondary | ICD-10-CM

## 2016-06-07 DIAGNOSIS — E785 Hyperlipidemia, unspecified: Secondary | ICD-10-CM

## 2016-06-07 DIAGNOSIS — Z23 Encounter for immunization: Secondary | ICD-10-CM | POA: Diagnosis not present

## 2016-06-07 DIAGNOSIS — Z7189 Other specified counseling: Secondary | ICD-10-CM

## 2016-06-07 DIAGNOSIS — I10 Essential (primary) hypertension: Secondary | ICD-10-CM

## 2016-06-07 MED ORDER — ATORVASTATIN CALCIUM 40 MG PO TABS: 40.0000 mg | ORAL_TABLET | Freq: Every day | ORAL | 3 refills | Status: DC

## 2016-06-07 NOTE — Progress Notes (Signed)
Patient presents to clinic today to establish care. He is a pleasant 75 year old male who  has a past medical history of Asthma; Bipolar disorder ([place]); Cervical spondylosis; Depression; Dysrhythmia (10-29-15); GERD (gastroesophageal reflux disease); Hip pain, left (2011-2012); Hyperlipemia; Hypertension; Inguinal hernia (02/25/11); Loss of hearing; MRSA carrier; Nephrolithiasis; Overdose of benzodiazepine (04/30/2013); Sleep apnea; and Tinnitus.   He is last physical was in January 2015 with MD Shawna Orleans   Acute Concerns: Establish Care   Chronic Issues: Bi Polar  - Well controlled with current medication.   Hypertension  - He is not currently on any medications. He does check at home periodically ( once every four months).  BP Readings from Last 3 Encounters:  06/07/16 134/82  05/24/16 130/78  01/20/16 (!) 146/82   LBBB - He was to have RIGHT WRIST ARTHROSCOPY WITH DEBRIDEMENT VS TRIANGULAR FIBROCARTILAGE COMPLEX REPAIR but prior to surgery his EKG showed LBBB. Surgery would like patient to be evaluated by cardiology prior to surgery .   Hyperlipidemia  - He reports that he has not taken medication " in a long time". He ran out of his statin and never had it refilled.   Health Maintenance: Dental -- Routine Care Vision -- Routine Care  Immunizations -- Needs flu and pneumonia vaccination  Colonoscopy -- Cologuard last year  Diet:  He has started a low carb diet  Exercise:  Does not exercise   Is followed by  1. Behavioral health    Past Medical History:  Diagnosis Date  . Asthma    as child, no problems now  . Bipolar disorder [facility])    Has psychiatrist.  BH admission (suicidal) 07/20/11.  Marland Kitchen Cervical spondylosis    Primarily C4-5 and C5-6--referred to [facility] 02/2011.  . Depression   . Dysrhythmia 10-29-15    new LBBB  . GERD (gastroesophageal reflux disease)   . Hip pain, left 2011-2012   Left hip and groin: MRI pelvis and left hip 02/18/11  showed NORMAL hip, with small inguinal hernia containing fat and sigmoid diverticulosis.  Hip pain presumably referred pain from hernia/diverticular dz??.  . Hyperlipemia    Intol of meds: GI side effects  . Hypertension     no meds now  . Inguinal hernia 02/25/11   Left sided hernia with fat  . Loss of hearing   . MRSA carrier    Intranasal bactroban tx 2011.  No hx of MRSA infection.  . Nephrolithiasis   . Overdose of benzodiazepine 04/30/2013   Status post  . Sleep apnea    states he has never had sleep study and only wife states he has OSA  . 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 ANKLE FRACTURE SURGERY     hardware still in both ankles (titanium)  . ANKLE SURGERY     Ankle tendon surgery  . ELBOW SURGERY     left---tendon  . INGUINAL HERNIA REPAIR     Right side with mesh about 1993  . OPEN REDUCTION INTERNAL FIXATION (ORIF) DISTAL RADIAL FRACTURE Right 10/29/2015   Procedure: OPEN REDUCTION INTERNAL FIXATION (ORIF) DISTAL RADIAL FRACTURE;  Surgeon: Leanora Cover, MD;  Location: [facility];  Service: Orthopedics;  Laterality: Right;  . TONSILLECTOMY    . TOTAL KNEE ARTHROPLASTY     Right and left (titanium)  . ULNAR COLLATERAL LIGAMENT REPAIR Right 12/29/2015  Procedure: REPAIR  RIGHT LATERAL ULNAR COLLATERAL LIGAMENT TEAR  EXTENSOR ORIGIN ;  Surgeon: Leanora Cover, MD;  Location: [facility];  Service: Orthopedics;  Laterality: Right;    Current Outpatient Prescriptions on File Prior to Visit  Medication Sig Dispense Refill  . ARIPiprazole ER (ABILIFY MAINTENA) 400 MG SUSR Inject 400 mg into the muscle every 30 (thirty) days. 1 each 0  . aspirin 81 MG tablet Take 81 mg by mouth daily.    . benztropine (COGENTIN) 1 MG tablet Take 1 tablet (1 mg total) by mouth daily. 30 tablet 1  . escitalopram (LEXAPRO) 20 MG tablet TAKE 1 TABLET EVERY DAY 30 tablet 1    . Flaxseed, Linseed, (FLAXSEED OIL PO) Take 1 tablet by mouth daily.    . Multiple Vitamin (MULTIVITAMIN WITH MINERALS) TABS tablet Take 1 tablet by mouth daily.    Marland Kitchen omeprazole (PRILOSEC) 40 MG capsule TAKE ONE CAPSULE BY MOUTH TWICE DAILY 60 capsule 5  . oxymetazoline (AFRIN) 0.05 % nasal spray Place 1 spray into both nostrils 2 (two) times daily as needed for congestion.     . temazepam (RESTORIL) 15 MG capsule Take 1 capsule (15 mg total) by mouth at bedtime as needed. for sleep 30 capsule 1  . traZODone (DESYREL) 100 MG tablet TAKE 1 TABLET (100 MG TOTAL) BY MOUTH AT BEDTIME. 30 tablet 1  . Vitamin D, Cholecalciferol, 1000 UNITS TABS Take 1 tablet by mouth daily.     Current Facility-Administered Medications on File Prior to Visit  Medication Dose Route Frequency Provider Last Rate Last Dose  . ARIPiprazole ER SUSR 400 mg  400 mg Intramuscular Q30 days Hampton Abbot, MD   400 mg at 05/24/16 1129    Allergies  Allergen Reactions  . Codeine Shortness Of Breath  . Penicillins Hives, Rash and Other (See Comments)    Watery blisters Has patient had a PCN reaction causing immediate rash, facial/tongue/throat swelling, SOB or lightheadedness with hypotension: No Has patient had a PCN reaction causing severe rash involving mucus membranes or skin necrosis: No Has patient had a PCN reaction that required hospitalization No Has patient had a PCN reaction occurring within the last 10 years: No If all of the above answers are "NO", then may proceed with Cephalosporin use.     Family History  Problem Relation Age of Onset  . Alcohol abuse Mother   . Ovarian cancer Mother   . Alcohol abuse Father   . Pancreatic cancer Father   . Depression Daughter   . Paranoid behavior Daughter     Social History   Social History  . Marital status: Married    Spouse name: N/A  . Number of children: N/A  . Years of education: N/A   Occupational History  . Not on file.   Social History Main  Topics  . Smoking status: Never Smoker  . Smokeless tobacco: Not on file  . Alcohol use 0.0 oz/week     Comment: One drink rarely  . Drug use: No  . Sexual activity: Not Currently    Birth control/ protection: None     Comment: Same partner for 45 years   Other Topics Concern  . Not on file   Social History Narrative   Married, lives in [place].  Retired Dance movement psychotherapist.   Two daughters, both married,3 grandchildren ( 56, 48, 1 year)    No regular exercise.  Never smoker.  No ETOH.  No drugs.    Review of Systems  Constitutional: Negative.   HENT: Negative.   Eyes: Negative.   Respiratory: Negative.   Cardiovascular: Negative.   Gastrointestinal: Negative.   Genitourinary: Negative.   Musculoskeletal: Negative.   Skin: Negative.   Neurological: Negative.   Endo/Heme/Allergies: Negative.   Psychiatric/Behavioral: Negative.   All other systems reviewed and are negative.   BP 134/82   Temp 98.6 F (37 C) (Oral)   Ht 5\' 9"  (1.753 m)   Wt 260 lb 12.8 oz (118.3 kg)   BMI 38.51 kg/m   Physical Exam  Constitutional: He is oriented to person, place, and time and well-developed, well-nourished, and in no distress. No distress.  HENT:  Head: Normocephalic and atraumatic.  Right Ear: External ear normal.  Left Ear: External ear normal.  Nose: Nose normal.  Mouth/Throat: Oropharynx is clear and moist. No oropharyngeal exudate.  Eyes: Conjunctivae and EOM are normal. Pupils are equal, round, and reactive to light. Left eye exhibits no discharge. No scleral icterus.  Neck: Normal range of motion. Neck supple. No JVD present. No tracheal deviation present. No thyromegaly present.  Cardiovascular: Normal rate, regular rhythm, normal heart sounds and intact distal pulses.  Exam reveals no friction rub.   No murmur heard. Pulmonary/Chest: Effort normal and breath sounds normal. No stridor. No respiratory distress. He has no wheezes. He has no rales. He exhibits no tenderness.    Abdominal: Soft. Bowel sounds are normal. He exhibits no distension and no mass. There is no tenderness. There is no rebound and no guarding.  Obese   Musculoskeletal: Normal range of motion. He exhibits edema (trace bilateral lower extremity edema ). He exhibits no tenderness or deformity.  Neurological: He is alert and oriented to person, place, and time. Gait normal. GCS score is 15.  Skin: Skin is warm and dry. No rash noted. He is not diaphoretic. No erythema. No pallor.  Old surgical scars on bilateral knees  Psychiatric: Mood, memory, affect and judgment normal.  Nursing note and vitals reviewed.   Assessment/Plan:  1. Encounter to establish care - He will follow up for his CPE - Follow up sooner with any acute issues - Will restart statin  - Encouraged to start exercising 3 x a week  - Encouraged heart healthy diet  2. LBBB (left bundle branch block) - EKG 12-Lead- NSR, Rate 72 - Will still refer to cardiology for pre surgical clearance  - Ambulatory referral to Cardiology  3. Hyperlipidemia - atorvastatin (LIPITOR) 40 MG tablet; Take 1 tablet (40 mg total) by mouth daily.  Dispense: 90 tablet; Refill: 3  4. Essential hypertension - EKG 12-Lead  5. Need for influenza vaccination - Flu vaccine HIGH DOSE PF (Fluzone High dose)  [name], NP

## 2016-06-07 NOTE — Patient Instructions (Signed)
It was great seeing you today!  Someone from the cardiology office will contract you to set up that appointment.   I have sent in a prescription for Lipitor to help lower your cholesterol   Please follow up with me for your physical exam. If you need anything prior to that, please tle me know

## 2016-06-09 ENCOUNTER — Ambulatory Visit (HOSPITAL_COMMUNITY): Payer: Self-pay | Admitting: Psychiatry

## 2016-06-10 ENCOUNTER — Encounter: Payer: Self-pay | Admitting: Cardiology

## 2016-06-10 ENCOUNTER — Ambulatory Visit (INDEPENDENT_AMBULATORY_CARE_PROVIDER_SITE_OTHER): Payer: PPO | Admitting: Cardiology

## 2016-06-10 VITALS — BP 128/84 | HR 71 | Ht 69.5 in | Wt 261.2 lb

## 2016-06-10 DIAGNOSIS — I447 Left bundle-branch block, unspecified: Secondary | ICD-10-CM | POA: Diagnosis not present

## 2016-06-10 DIAGNOSIS — Z01810 Encounter for preprocedural cardiovascular examination: Secondary | ICD-10-CM | POA: Diagnosis not present

## 2016-06-10 DIAGNOSIS — E785 Hyperlipidemia, unspecified: Secondary | ICD-10-CM

## 2016-06-10 NOTE — Progress Notes (Signed)
PCP: Sean Peng, NP  Clinic Note: Chief Complaint  Patient presents with  . New Patient (Initial Visit)    sob when exerting self. lightheaded; when getting up too fast.  . Pre-op Exam    LBBB on EKG    HPI: Sean Hill is a 75 y.o. male with a PMH below who presents today for Initial cardiology consultation for preoperative assessment based on a left bundle branch block noted on EKG in February 2017 for preoperative evaluation. He is now pending his third surgery to repair his wrist/arm after a fall down the stairs earlier this year. Apparently left bundle branch block was noted in the first EKG for his preoperative evaluation back in February, and then was not there the second time. The second time he had been evaluated by his PCP and everything is felt to be normal so is cleared for surgery. However now for his third surgery for some reason he has to cardiology evaluation for an EKG back in January. He does not have any prior cardiac history besides being told he had some thickening of the heart back in the early 80s when he had a cardiac catheterization showing normal coronaries.Sean Hill was last seen on September 19 by his new PCP to establish care. He has a diagnosis of asthma and bipolar disease/depression. He is relatively limited as far as activity because of his bipolar medications making him feel chronically fatigued.  Recent Hospitalizations: Only for his surgeries.  Studies Reviewed:   Carotid Dopplers: Mild heterogenous plaque bilaterally in the internal carotid arteries. Normal subclavian arteries bilaterally. Patent vertebral arteries with antegrade flow.  Interval History: [name] presents today really without any cardiac symptoms. He has some exertional dyspnea that he really to be stability deconditioned. He doesn't do much the way of any activity as far as exercise. He is moderately obese. He carries a diagnosis of hypertension and hyperlipidemia  but is not on an antihypertensive agent. He was just started on atorvastatin by his new PCP.  He denies any chest tightness or pressure with rest or exertion. He'll note some exertional dyspnea if he tries to go fast or goes upstairs. This is pretty much chronic and not new. He would easily be able to walk around the block at a stable phase, but would get short of breath going up stairs or an incline. > 4 METS. Mild Edema but no PND/orthopnea -> he sleeps with his head propped up a little bit for GERD issues. He snores, but his wife does not indicate that he has episodes of stopping breathing at nighttime. He does have daytime sleepiness.  No palpitations, weakness or syncope/near syncope. No TIA/amaurosis fugax symptoms. No claudication.  ROS: A comprehensive was performed. Review of Systems  Constitutional: Positive for malaise/fatigue (Generally feels like he is in a constant stupor from his bipolar medications.).  HENT: Negative for nosebleeds.   Respiratory: Positive for shortness of breath. Negative for cough and wheezing.   Cardiovascular: Negative.        Per history of present illness  Gastrointestinal: Negative for blood in stool.  Musculoskeletal: Positive for joint pain (Right arm and left knee).  Neurological: Positive for dizziness (Sometimes with change in position). Negative for headaches.  Psychiatric/Behavioral: Positive for depression (Stable bipolar). Negative for memory loss. Suicidal ideas: .all.  All other systems reviewed and are negative.   Past Medical History:  Diagnosis Date  . Asthma    as child, no problems now  .  Bipolar disorder [facility])    Has psychiatrist.  BH admission (suicidal) 07/20/11.  Marland Kitchen Cervical spondylosis    Primarily C4-5 and C5-6--referred to [facility] 02/2011.  . Depression   . GERD (gastroesophageal reflux disease)   . Hip pain, left 2011-2012   Left hip and groin: MRI pelvis and left hip 02/18/11 showed NORMAL  hip, with small inguinal hernia containing fat and sigmoid diverticulosis.  Hip pain presumably referred pain from hernia/diverticular dz??.  . Hyperlipemia    Intol of meds: GI side effects  . Hypertension     no meds now  . Inguinal hernia 02/25/11   Left sided hernia with fat  . Left bundle branch block (LBBB) on electrocardiogram 10/29/2015   Rate related bundle branch block. Only noted once in February 2017.  Marland Kitchen Loss of hearing   . MRSA carrier    Intranasal bactroban tx 2011.  No hx of MRSA infection.  . Nephrolithiasis   . Overdose of benzodiazepine 04/30/2013   Status post  . Sleep apnea    states he has never had sleep study and only wife states he has OSA  . 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 ANKLE FRACTURE SURGERY     hardware still in both ankles (titanium)  . ANKLE SURGERY     Ankle tendon surgery  . CARDIAC CATHETERIZATION  1983   Angiographically normal coronary arteries. LVH noted.  . ELBOW SURGERY     left---tendon  . INGUINAL HERNIA REPAIR     Right side with mesh about 1993  . OPEN REDUCTION INTERNAL FIXATION (ORIF) DISTAL RADIAL FRACTURE Right 10/29/2015   Procedure: OPEN REDUCTION INTERNAL FIXATION (ORIF) DISTAL RADIAL FRACTURE;  Surgeon: Leanora Cover, MD;  Location: [facility];  Service: Orthopedics;  Laterality: Right;  . TONSILLECTOMY    . TOTAL KNEE ARTHROPLASTY     Right and left (titanium)  . ULNAR COLLATERAL LIGAMENT REPAIR Right 12/29/2015   Procedure: REPAIR  RIGHT LATERAL ULNAR COLLATERAL LIGAMENT TEAR  EXTENSOR ORIGIN ;  Surgeon: Leanora Cover, MD;  Location: [facility];  Service: Orthopedics;  Laterality: Right;    Prior to Admission medications   Medication Sig Start Date End Date Taking? Authorizing Provider  ARIPiprazole ER (ABILIFY MAINTENA) 400 MG SUSR Inject 400 mg into the muscle every 30 (thirty) days. 05/24/16  Yes  Clarene Reamer, MD  aspirin 81 MG tablet Take 81 mg by mouth daily.   Yes Historical Provider, MD  atorvastatin (LIPITOR) 40 MG tablet Take 1 tablet (40 mg total) by mouth daily. 06/07/16  Yes Sean Peng, NP  benztropine (COGENTIN) 1 MG tablet Take 1 tablet (1 mg total) by mouth daily. 04/19/16  Yes [name], MD  escitalopram (LEXAPRO) 20 MG tablet TAKE 1 TABLET EVERY DAY 06/01/16  Yes [name], MD  Flaxseed, Linseed, (FLAXSEED OIL PO) Take 1 tablet by mouth daily.   Yes Historical Provider, MD  Multiple Vitamin (MULTIVITAMIN WITH MINERALS) TABS tablet Take 1 tablet by mouth daily.   Yes Historical Provider, MD  omeprazole (PRILOSEC) 40 MG capsule TAKE ONE CAPSULE BY MOUTH TWICE DAILY 01/04/16  Yes Doe-Hyun R Shawna Orleans, DO  oxymetazoline (AFRIN) 0.05 % nasal spray Place 1 spray into both nostrils 2 (two) times daily as needed for congestion.    Yes Historical Provider, MD  temazepam (RESTORIL) 15 MG capsule Take 1 capsule (  15 mg total) by mouth at bedtime as needed. for sleep 04/19/16  Yes [name], MD  traZODone (DESYREL) 100 MG tablet TAKE 1 TABLET (100 MG TOTAL) BY MOUTH AT BEDTIME. 04/06/16  Yes [name], MD  Vitamin D, Cholecalciferol, 1000 UNITS TABS Take 1 tablet by mouth daily.   Yes Historical Provider, MD   Allergies  Allergen Reactions  . Codeine Shortness Of Breath  . Penicillins Hives, Rash and Other (See Comments)    Watery blisters Has patient had a PCN reaction causing immediate rash, facial/tongue/throat swelling, SOB or lightheadedness with hypotension: No Has patient had a PCN reaction causing severe rash involving mucus membranes or skin necrosis: No Has patient had a PCN reaction that required hospitalization No Has patient had a PCN reaction occurring within the last 10 years: No If all of the above answers are "NO", then may proceed with Cephalosporin use.     Social History   Social History  . Marital status: Married    Spouse name: N/A  . Number of  children: N/A  . Years of education: N/A   Social History Main Topics  . Smoking status: Never Smoker  . Smokeless tobacco: Never Used  . Alcohol use 0.0 oz/week     Comment: One drink rarely  . Drug use: No  . Sexual activity: Not Currently    Birth control/ protection: None     Comment: Same partner for 45 years   Other Topics Concern  . None   Social History Narrative   Married, lives in [place].  Retired Dance movement psychotherapist.   Two daughters, both married,3 grandchildren ( 20, 12, 1 year)    No regular exercise.  Never smoker.  No ETOH.  No drugs.   Family History  Problem Relation Age of Onset  . Alcohol abuse Mother   . Ovarian cancer Mother   . Alcohol abuse Father   . Pancreatic cancer Father   . Depression Daughter   . Paranoid behavior Daughter   . Hyperlipidemia Brother    Wt Readings from Last 3 Encounters:  06/10/16 118.5 kg (261 lb 3.2 oz)  06/07/16 118.3 kg (260 lb 12.8 oz)  12/29/15 116.1 kg (256 lb)    PHYSICAL EXAM BP 128/84   Pulse 71   Ht 5' 9.5" (1.765 m)   Wt 118.5 kg (261 lb 3.2 oz)   BMI 38.02 kg/m  General appearance: alert, cooperative, appears stated age, no distress and Moderate truncal obesity; somewhat blunted/flat affect but pleasant mood. Neck: no adenopathy, no carotid bruit and no JVD Lungs: clear to auscultation bilaterally, normal percussion bilaterally and non-labored Heart: Distant heart sounds. regular rate and rhythm, S1 and S2 normal, no murmur, click, rub or gallop; unable to palpate PMI Abdomen: soft, non-tender; bowel sounds normal; no masses,  no organomegaly; truncal obesity Extremities: extremities normal, atraumatic, no cyanosis, and edema trace Pulses: 2+ and symmetric;  Skin: normal, mobility and turgor normal, no edema and no evidence of bleeding or bruising Neurologic: Mental status: Alert, oriented, thought content appropriate Cranial nerves: normal (II-XII grossly intact)    Adult ECG Report  Rate: 71 ;  Rhythm:  normal sinus rhythm and LVH with mild repolarization abnormality. Q-wave in lead III - not c/w Inferior MI, age undetermined ;   Narrative Interpretation: Relatively normal EKG.  (only LBBB noted was in Feb 2017 - HR was 81 bpm.   Other studies Reviewed: Additional studies/ records that were reviewed today include:  Recent Labs:  Lab Results  Component Value Date   CHOL 243 (H) 10/11/2013   HDL 44.30 10/11/2013   LDLDIRECT 158.4 10/11/2013   TRIG 234.0 (H) 10/11/2013   CHOLHDL 5 10/11/2013      ASSESSMENT / PLAN: Problem List Items Addressed This Visit    Preoperative cardiovascular examination - Primary    PREOPERATIVE CARDIAC RISK ASSESSMENT   Revised Cardiac Risk Index:  High Risk Surgery: no; peripheral orthopedic surgery  Defined as Intraperitoneal, intrathoracic or suprainguinal vascular  Active CAD: no; no active anginal symptoms  CHF: no; stable mild exertional dyspnea more related to deconditioning.  Cerebrovascular Disease: no;   Diabetes: no; On Insulin: no  CKD (Cr >~ 2): no;   Total: o Estimated Risk of Adverse Outcome: LOW RISK Patient for LOW RISK Surgery  Estimated Risk of MI, PE, VF/VT (Cardiac Arrest), Complete Heart Block: <1 %   ACC/AHA Guidelines for "Clearance":  Step 1 - Need for Emergency Surgery: No: but time sensitive  If Yes - go straight to OR with perioperative surveillance  Step 2 - Active Cardiac Conditions (Unstable Angina, Decompensated HF, Significant  Arrhytmias - Complete HB, Mobitz II, Symptomatic VT or SVT, Severe Aortic Stenosis - mean gradient > 40 mmHg, Valve area < 1.0 cm2):   No:    If Yes - Evaluate & Treat per ACC/AHA Guidelines  Step 3 -  Low Risk Surgery: Yes  If Yes --> proceed to OR  If No --> Step 4  Step 4 - Functional Capacity >= 4 METS without symptoms: Yes  If Yes --> proceed to OR  If No --> Step 5  Step 5 --  Clinical Risk Factors (CRF)   3 or more: No: 0 See above  If Yes -- assess  Surgical Risk, --   (High Risk Non-cardiac), Intraabdominal or thoracic vascular surgery consider testing if it will change management.  Intermediate Risk: Proceed to OR with HR control, or consider testing if it will change management  No CRFs: Yes  If Yes --> Proceed to OR  Based on ACC/AHA recommendations for preoperative evaluation, the patient will be a low risk patient for low risk surgery. Would proceed to the OR without further cardiology evaluation. The LBBB noted on EKG is not currently present. Will be evaluated with an echocardiogram. Only if this is abnormal we'll proceed with further evaluation.        Relevant Orders   EKG 12-Lead   ECHOCARDIOGRAM COMPLETE   Left bundle branch block (LBBB) on electrocardiogram (Chronic)    Left bundle-branch block noted on one EKG in February 2017. The rate was faster than this current EKG. He does not have any significant cardiac symptoms otherwise. My suspicion is that this is a rate related bundle branch block, and would like to put him on treadmill to assess it I am not however it should and doing preop ischemic evaluation in a patient is asymptomatic.  Plan: Check 2-D echocardiogram to assess LV function and for any wall motion abnormality to explain LBBB.  - Further evaluation pending results of echo.      Relevant Orders   EKG 12-Lead   ECHOCARDIOGRAM COMPLETE   Hyperlipidemia    I don't see recent labs. Was just started on a statin by his PCP       Other Visit Diagnoses   None.    We will be available is needed post-OP.  Current medicines are reviewed at length with the patient today. (+/- concerns) None The following changes  have been made: None Studies Ordered:   Orders Placed This Encounter  Procedures  . EKG 12-Lead  . ECHOCARDIOGRAM COMPLETE   ROV: 6 WEEKS WITH DR Brylei Pedley. (Should be Post-OP)   Glenetta Hew, M.D., M.S. Interventional Cardiologist   Pager # [phone] Phone # [phone] [address] [place], [place] [address]

## 2016-06-10 NOTE — Patient Instructions (Addendum)
SCHEDULE AT [facility] has requested that you have an echocardiogram. Echocardiography is a painless test that uses sound waves to create images of your heart. It provides your doctor with information about the size and shape of your heart and how well your heart's chambers and valves are working. This procedure takes approximately one hour. There are no restrictions for this procedure.   IF TEST IS NORMAL ,YOU WILL HAVE CLEARANCE FOR SURGERY  Your physician recommends that you schedule a follow-up appointment in: [place].

## 2016-06-12 ENCOUNTER — Encounter: Payer: Self-pay | Admitting: Cardiology

## 2016-06-12 NOTE — Assessment & Plan Note (Signed)
I don't see recent labs. Was just started on a statin by his PCP

## 2016-06-12 NOTE — Assessment & Plan Note (Signed)
Left bundle-branch block noted on one EKG in February 2017. The rate was faster than this current EKG. He does not have any significant cardiac symptoms otherwise. My suspicion is that this is a rate related bundle branch block, and would like to put him on treadmill to assess it I am not however it should and doing preop ischemic evaluation in a patient is asymptomatic.  Plan: Check 2-D echocardiogram to assess LV function and for any wall motion abnormality to explain LBBB.  - Further evaluation pending results of echo.

## 2016-06-12 NOTE — Assessment & Plan Note (Signed)
PREOPERATIVE CARDIAC RISK ASSESSMENT   Revised Cardiac Risk Index:  High Risk Surgery: no; peripheral orthopedic surgery  Defined as Intraperitoneal, intrathoracic or suprainguinal vascular  Active CAD: no; no active anginal symptoms  CHF: no; stable mild exertional dyspnea more related to deconditioning.  Cerebrovascular Disease: no;   Diabetes: no; On Insulin: no  CKD (Cr >~ 2): no;   Total: o Estimated Risk of Adverse Outcome: LOW RISK Patient for LOW RISK Surgery  Estimated Risk of MI, PE, VF/VT (Cardiac Arrest), Complete Heart Block: <1 %   ACC/AHA Guidelines for "Clearance":  Step 1 - Need for Emergency Surgery: No: but time sensitive  If Yes - go straight to OR with perioperative surveillance  Step 2 - Active Cardiac Conditions (Unstable Angina, Decompensated HF, Significant  Arrhytmias - Complete HB, Mobitz II, Symptomatic VT or SVT, Severe Aortic Stenosis - mean gradient > 40 mmHg, Valve area < 1.0 cm2):   No:    If Yes - Evaluate & Treat per ACC/AHA Guidelines  Step 3 -  Low Risk Surgery: Yes  If Yes --> proceed to OR  If No --> Step 4  Step 4 - Functional Capacity >= 4 METS without symptoms: Yes  If Yes --> proceed to OR  If No --> Step 5  Step 5 --  Clinical Risk Factors (CRF)   3 or more: No: 0 See above  If Yes -- assess Surgical Risk, --   (High Risk Non-cardiac), Intraabdominal or thoracic vascular surgery consider testing if it will change management.  Intermediate Risk: Proceed to OR with HR control, or consider testing if it will change management  No CRFs: Yes  If Yes --> Proceed to OR  Based on ACC/AHA recommendations for preoperative evaluation, the patient will be a low risk patient for low risk surgery. Would proceed to the OR without further cardiology evaluation. The LBBB noted on EKG is not currently present. Will be evaluated with an echocardiogram. Only if this is abnormal we'll proceed with further evaluation.

## 2016-06-18 ENCOUNTER — Other Ambulatory Visit (HOSPITAL_COMMUNITY): Payer: Self-pay | Admitting: Psychiatry

## 2016-06-18 DIAGNOSIS — F319 Bipolar disorder, unspecified: Secondary | ICD-10-CM

## 2016-06-20 ENCOUNTER — Other Ambulatory Visit (HOSPITAL_COMMUNITY): Payer: Self-pay

## 2016-06-20 DIAGNOSIS — F319 Bipolar disorder, unspecified: Secondary | ICD-10-CM

## 2016-06-20 MED ORDER — TEMAZEPAM 15 MG PO CAPS: 15.0000 mg | ORAL_CAPSULE | Freq: Every evening | ORAL | 1 refills | Status: DC | PRN

## 2016-06-22 DIAGNOSIS — F319 Bipolar disorder, unspecified: Secondary | ICD-10-CM | POA: Diagnosis not present

## 2016-06-22 DIAGNOSIS — F411 Generalized anxiety disorder: Secondary | ICD-10-CM | POA: Diagnosis not present

## 2016-06-23 ENCOUNTER — Ambulatory Visit (INDEPENDENT_AMBULATORY_CARE_PROVIDER_SITE_OTHER): Payer: PPO

## 2016-06-23 DIAGNOSIS — F319 Bipolar disorder, unspecified: Secondary | ICD-10-CM | POA: Diagnosis not present

## 2016-06-23 DIAGNOSIS — F3131 Bipolar disorder, current episode depressed, mild: Secondary | ICD-10-CM

## 2016-06-23 NOTE — Progress Notes (Signed)
Patient presented for injection of Abilify Maintena 400 mgwith appropriate affect, level mood and denied any current AH/VH  Denies any racing thoughts or problems with mood.   Patient reported no current problems with medications and no other concerns.  Abilify Maintena 400 mg was prepared as ordered and administered as ordered IM in patient's right upper outer gluteal quadrant.  Patient tolerated injection without complaint of pain or discomfort and patient to return in 30 days for next due injection. Patient agreed to come back in 30 days and to call with any problems or questions.

## 2016-06-24 ENCOUNTER — Ambulatory Visit (HOSPITAL_COMMUNITY): Payer: PPO | Attending: Internal Medicine

## 2016-06-24 ENCOUNTER — Other Ambulatory Visit: Payer: Self-pay

## 2016-06-24 DIAGNOSIS — I501 Left ventricular failure: Secondary | ICD-10-CM | POA: Diagnosis not present

## 2016-06-24 DIAGNOSIS — Z01810 Encounter for preprocedural cardiovascular examination: Secondary | ICD-10-CM

## 2016-06-24 DIAGNOSIS — I340 Nonrheumatic mitral (valve) insufficiency: Secondary | ICD-10-CM | POA: Diagnosis not present

## 2016-06-24 DIAGNOSIS — I447 Left bundle-branch block, unspecified: Secondary | ICD-10-CM | POA: Diagnosis not present

## 2016-06-28 ENCOUNTER — Other Ambulatory Visit (HOSPITAL_COMMUNITY): Payer: Self-pay | Admitting: Psychiatry

## 2016-06-28 NOTE — Progress Notes (Signed)
Echo results:Kermit Balo news: Essentially normal echocardiogram and normal pump function.  EF: 60-65%. No regional wall motion abnormalities suggests No signs to suggest heart attack.. Normal Valve function.     -- Suggests LBBB is related to chronic wear & tear in the conduction system & not related to structural abnormality  Glenetta Hew, MD

## 2016-06-29 ENCOUNTER — Other Ambulatory Visit: Payer: Self-pay | Admitting: Orthopedic Surgery

## 2016-06-29 ENCOUNTER — Other Ambulatory Visit: Payer: Self-pay | Admitting: Internal Medicine

## 2016-07-07 ENCOUNTER — Encounter (HOSPITAL_BASED_OUTPATIENT_CLINIC_OR_DEPARTMENT_OTHER): Payer: Self-pay | Admitting: *Deleted

## 2016-07-14 ENCOUNTER — Ambulatory Visit (HOSPITAL_BASED_OUTPATIENT_CLINIC_OR_DEPARTMENT_OTHER): Payer: PPO | Admitting: Certified Registered"

## 2016-07-14 ENCOUNTER — Encounter (HOSPITAL_BASED_OUTPATIENT_CLINIC_OR_DEPARTMENT_OTHER)
Admission: RE | Disposition: A | Discharge status: Home / Self Care | Payer: Self-pay | Source: Ambulatory Visit | Attending: Orthopedic Surgery

## 2016-07-14 ENCOUNTER — Ambulatory Visit (HOSPITAL_BASED_OUTPATIENT_CLINIC_OR_DEPARTMENT_OTHER)
Admission: RE | Admit: 2016-07-14 | Discharge: 2016-07-14 | Disposition: A | Discharge status: Home / Self Care | Payer: PPO | Source: Ambulatory Visit | Attending: Orthopedic Surgery | Admitting: Orthopedic Surgery

## 2016-07-14 ENCOUNTER — Encounter (HOSPITAL_BASED_OUTPATIENT_CLINIC_OR_DEPARTMENT_OTHER): Payer: Self-pay | Admitting: Certified Registered"

## 2016-07-14 DIAGNOSIS — J45909 Unspecified asthma, uncomplicated: Secondary | ICD-10-CM | POA: Diagnosis not present

## 2016-07-14 DIAGNOSIS — Z800 Family history of malignant neoplasm of digestive organs: Secondary | ICD-10-CM | POA: Diagnosis not present

## 2016-07-14 DIAGNOSIS — I447 Left bundle-branch block, unspecified: Secondary | ICD-10-CM | POA: Insufficient documentation

## 2016-07-14 DIAGNOSIS — F419 Anxiety disorder, unspecified: Secondary | ICD-10-CM | POA: Insufficient documentation

## 2016-07-14 DIAGNOSIS — M24631 Ankylosis, right wrist: Secondary | ICD-10-CM | POA: Diagnosis not present

## 2016-07-14 DIAGNOSIS — Z6836 Body mass index (BMI) 36.0-36.9, adult: Secondary | ICD-10-CM | POA: Diagnosis not present

## 2016-07-14 DIAGNOSIS — Z811 Family history of alcohol abuse and dependence: Secondary | ICD-10-CM | POA: Diagnosis not present

## 2016-07-14 DIAGNOSIS — Z880 Allergy status to penicillin: Secondary | ICD-10-CM | POA: Insufficient documentation

## 2016-07-14 DIAGNOSIS — M47812 Spondylosis without myelopathy or radiculopathy, cervical region: Secondary | ICD-10-CM | POA: Insufficient documentation

## 2016-07-14 DIAGNOSIS — Z8041 Family history of malignant neoplasm of ovary: Secondary | ICD-10-CM | POA: Insufficient documentation

## 2016-07-14 DIAGNOSIS — Z885 Allergy status to narcotic agent status: Secondary | ICD-10-CM | POA: Diagnosis not present

## 2016-07-14 DIAGNOSIS — I10 Essential (primary) hypertension: Secondary | ICD-10-CM | POA: Diagnosis not present

## 2016-07-14 DIAGNOSIS — Z9989 Dependence on other enabling machines and devices: Secondary | ICD-10-CM | POA: Diagnosis not present

## 2016-07-14 DIAGNOSIS — G8918 Other acute postprocedural pain: Secondary | ICD-10-CM | POA: Diagnosis not present

## 2016-07-14 DIAGNOSIS — M19131 Post-traumatic osteoarthritis, right wrist: Secondary | ICD-10-CM | POA: Insufficient documentation

## 2016-07-14 DIAGNOSIS — I129 Hypertensive chronic kidney disease with stage 1 through stage 4 chronic kidney disease, or unspecified chronic kidney disease: Secondary | ICD-10-CM | POA: Diagnosis not present

## 2016-07-14 DIAGNOSIS — F319 Bipolar disorder, unspecified: Secondary | ICD-10-CM | POA: Insufficient documentation

## 2016-07-14 DIAGNOSIS — G4730 Sleep apnea, unspecified: Secondary | ICD-10-CM | POA: Diagnosis not present

## 2016-07-14 DIAGNOSIS — E785 Hyperlipidemia, unspecified: Secondary | ICD-10-CM | POA: Insufficient documentation

## 2016-07-14 DIAGNOSIS — S63591A Other specified sprain of right wrist, initial encounter: Secondary | ICD-10-CM | POA: Diagnosis not present

## 2016-07-14 DIAGNOSIS — S6981XA Other specified injuries of right wrist, hand and finger(s), initial encounter: Secondary | ICD-10-CM | POA: Diagnosis not present

## 2016-07-14 DIAGNOSIS — G4733 Obstructive sleep apnea (adult) (pediatric): Secondary | ICD-10-CM | POA: Insufficient documentation

## 2016-07-14 DIAGNOSIS — M65831 Other synovitis and tenosynovitis, right forearm: Secondary | ICD-10-CM | POA: Insufficient documentation

## 2016-07-14 DIAGNOSIS — K219 Gastro-esophageal reflux disease without esophagitis: Secondary | ICD-10-CM | POA: Diagnosis not present

## 2016-07-14 DIAGNOSIS — M25531 Pain in right wrist: Secondary | ICD-10-CM | POA: Diagnosis not present

## 2016-07-14 HISTORY — DX: Anxiety disorder, unspecified: F41.9

## 2016-07-14 HISTORY — PX: WRIST ARTHROSCOPY WITH DEBRIDEMENT: SHX6194

## 2016-07-14 SURGERY — WRIST ARTHROSCOPY WITH DEBRIDEMENT
Anesthesia: Regional | Site: Wrist | Laterality: Right

## 2016-07-14 MED ORDER — FENTANYL CITRATE (PF) 100 MCG/2ML IJ SOLN
50.0000 ug | INTRAMUSCULAR | Status: DC | PRN
  Administered 2016-07-14: 75 ug via INTRAVENOUS

## 2016-07-14 MED ORDER — FENTANYL CITRATE (PF) 100 MCG/2ML IJ SOLN
INTRAMUSCULAR | Status: AC
  Filled 2016-07-14: qty 2

## 2016-07-14 MED ORDER — PROPOFOL 10 MG/ML IV BOLUS
INTRAVENOUS | Status: AC
  Filled 2016-07-14: qty 20

## 2016-07-14 MED ORDER — DEXAMETHASONE SODIUM PHOSPHATE 10 MG/ML IJ SOLN
INTRAMUSCULAR | Status: DC | PRN
Start: 2016-07-14 — End: 2016-07-14
  Administered 2016-07-14: 10 mg via INTRAVENOUS

## 2016-07-14 MED ORDER — CHLORHEXIDINE GLUCONATE 4 % EX LIQD: 60.0000 mL | Freq: Once | CUTANEOUS | Status: DC

## 2016-07-14 MED ORDER — MIDAZOLAM HCL 2 MG/2ML IJ SOLN: 1.0000 mg | INTRAMUSCULAR | Status: DC | PRN

## 2016-07-14 MED ORDER — PROPOFOL 10 MG/ML IV BOLUS
INTRAVENOUS | Status: DC | PRN
  Administered 2016-07-14: 300 mg via INTRAVENOUS

## 2016-07-14 MED ORDER — MIDAZOLAM HCL 2 MG/2ML IJ SOLN
INTRAMUSCULAR | Status: AC
  Filled 2016-07-14: qty 2

## 2016-07-14 MED ORDER — FENTANYL CITRATE (PF) 100 MCG/2ML IJ SOLN: 25.0000 ug | INTRAMUSCULAR | Status: DC | PRN

## 2016-07-14 MED ORDER — GLYCOPYRROLATE 0.2 MG/ML IJ SOLN: 0.2000 mg | Freq: Once | INTRAMUSCULAR | Status: DC | PRN

## 2016-07-14 MED ORDER — ONDANSETRON HCL 4 MG/2ML IJ SOLN
INTRAMUSCULAR | Status: AC
  Filled 2016-07-14: qty 2

## 2016-07-14 MED ORDER — DEXAMETHASONE SODIUM PHOSPHATE 10 MG/ML IJ SOLN
INTRAMUSCULAR | Status: AC
  Filled 2016-07-14: qty 1

## 2016-07-14 MED ORDER — OXYCODONE-ACETAMINOPHEN 5-325 MG PO TABS: ORAL_TABLET | ORAL | 0 refills | Status: DC

## 2016-07-14 MED ORDER — LIDOCAINE HCL (CARDIAC) 20 MG/ML IV SOLN
INTRAVENOUS | Status: DC | PRN
  Administered 2016-07-14: 20 mg via INTRAVENOUS

## 2016-07-14 MED ORDER — LACTATED RINGERS IV SOLN
INTRAVENOUS | Status: DC
  Administered 2016-07-14: 13:00:00 via INTRAVENOUS

## 2016-07-14 MED ORDER — LIDOCAINE 2% (20 MG/ML) 5 ML SYRINGE
INTRAMUSCULAR | Status: AC
  Filled 2016-07-14: qty 5

## 2016-07-14 MED ORDER — VANCOMYCIN HCL IN DEXTROSE 1-5 GM/200ML-% IV SOLN
1000.0000 mg | INTRAVENOUS | Status: AC
  Administered 2016-07-14: 1000 mg via INTRAVENOUS

## 2016-07-14 MED ORDER — SCOPOLAMINE 1 MG/3DAYS TD PT72: 1.0000 | MEDICATED_PATCH | Freq: Once | TRANSDERMAL | Status: DC | PRN

## 2016-07-14 MED ORDER — VANCOMYCIN HCL IN DEXTROSE 1-5 GM/200ML-% IV SOLN
INTRAVENOUS | Status: AC
  Filled 2016-07-14: qty 200

## 2016-07-14 MED ORDER — ROPIVACAINE HCL 5 MG/ML IJ SOLN
INTRAMUSCULAR | Status: DC | PRN
  Administered 2016-07-14: 25 mL via PERINEURAL

## 2016-07-14 MED ORDER — ONDANSETRON HCL 4 MG/2ML IJ SOLN: 4.0000 mg | Freq: Once | INTRAMUSCULAR | Status: DC | PRN

## 2016-07-14 SURGICAL SUPPLY — 82 items
BANDAGE ACE 3X5.8 VEL STRL LF (GAUZE/BANDAGES/DRESSINGS) ×2 IMPLANT
BANDAGE ACE 4X5 VEL STRL LF (GAUZE/BANDAGES/DRESSINGS) ×2 IMPLANT
BLADE CUDA 2.0 (BLADE) IMPLANT
BLADE EAR TYMPAN 2.5 60D BEAV (BLADE) IMPLANT
BLADE MINI RND TIP GREEN BEAV (BLADE) IMPLANT
BLADE SURG 15 STRL LF DISP TIS (BLADE) ×2 IMPLANT
BLADE SURG 15 STRL SS (BLADE) ×4
BNDG CMPR 9X4 STRL LF SNTH (GAUZE/BANDAGES/DRESSINGS)
BNDG ESMARK 4X9 LF (GAUZE/BANDAGES/DRESSINGS) IMPLANT
BNDG GAUZE ELAST 4 BULKY (GAUZE/BANDAGES/DRESSINGS) ×2 IMPLANT
BUR CUDA 2.9 (BURR) IMPLANT
BUR FULL RADIUS 2.0 (BURR) IMPLANT
BUR FULL RADIUS 2.9 (BURR) IMPLANT
BUR GATOR 2.9 (BURR) IMPLANT
BUR SPHERICAL 2.9 (BURR) IMPLANT
CANISTER SUCT 1200ML W/VALVE (MISCELLANEOUS) IMPLANT
CHLORAPREP W/TINT 26ML (MISCELLANEOUS) ×2 IMPLANT
CORDS BIPOLAR (ELECTRODE) IMPLANT
COVER BACK TABLE 60X90IN (DRAPES) ×2 IMPLANT
CUFF TOURNIQUET SINGLE 18IN (TOURNIQUET CUFF) ×2 IMPLANT
DRAPE EXTREMITY T 121X128X90 (DRAPE) ×2 IMPLANT
DRAPE IMP U-DRAPE 54X76 (DRAPES) ×2 IMPLANT
DRAPE OEC MINIVIEW 54X84 (DRAPES) IMPLANT
DRAPE SURG 17X23 STRL (DRAPES) IMPLANT
ELECT SMALL JOINT 90D BASC (ELECTRODE) IMPLANT
GAUZE SPONGE 4X4 12PLY STRL (GAUZE/BANDAGES/DRESSINGS) ×2 IMPLANT
GAUZE XEROFORM 1X8 LF (GAUZE/BANDAGES/DRESSINGS) ×2 IMPLANT
GLOVE BIO SURGEON STRL SZ7.5 (GLOVE) ×2 IMPLANT
GLOVE BIOGEL PI IND STRL 7.5 (GLOVE) ×1 IMPLANT
GLOVE BIOGEL PI IND STRL 8.5 (GLOVE) ×1 IMPLANT
GLOVE BIOGEL PI INDICATOR 7.5 (GLOVE) ×1
GLOVE BIOGEL PI INDICATOR 8.5 (GLOVE) ×1
GLOVE SURG ORTHO 8.0 STRL STRW (GLOVE) ×1 IMPLANT
GLOVE SURG SS PI 7.0 STRL IVOR (GLOVE) ×1 IMPLANT
GLOVE SURG SYN 7.5  E (GLOVE) ×1
GLOVE SURG SYN 7.5 E (GLOVE) ×1 IMPLANT
GLOVE SURG SYN 7.5 PF PI (GLOVE) IMPLANT
GOWN STRL REUS W/ TWL LRG LVL3 (GOWN DISPOSABLE) ×2 IMPLANT
GOWN STRL REUS W/TWL LRG LVL3 (GOWN DISPOSABLE) ×4
GOWN STRL REUS W/TWL XL LVL3 (GOWN DISPOSABLE) ×4 IMPLANT
IV SET EXTENSION GRAVITY 40 LF (IV SETS) ×2 IMPLANT
MANIFOLD NEPTUNE II (INSTRUMENTS) IMPLANT
NDL EPIDURAL TUOHY 20GX3.5 (NEEDLE) IMPLANT
NDL SAFETY ECLIPSE 18X1.5 (NEEDLE) ×1 IMPLANT
NDL SPNL 18GX3.5 QUINCKE PK (NEEDLE) IMPLANT
NEEDLE HYPO 18GX1.5 SHARP (NEEDLE) ×2
NEEDLE HYPO 22GX1.5 SAFETY (NEEDLE) ×2 IMPLANT
NEEDLE SPNL 18GX3.5 QUINCKE PK (NEEDLE) IMPLANT
NEEDLE TUOHY 20GX3.5 (NEEDLE) IMPLANT
PACK BASIN DAY SURGERY FS (CUSTOM PROCEDURE TRAY) ×2 IMPLANT
PAD CAST 3X4 CTTN HI CHSV (CAST SUPPLIES) ×1 IMPLANT
PADDING CAST ABS 3INX4YD NS (CAST SUPPLIES) ×1
PADDING CAST ABS 4INX4YD NS (CAST SUPPLIES) ×1
PADDING CAST ABS COTTON 3X4 (CAST SUPPLIES) ×1 IMPLANT
PADDING CAST ABS COTTON 4X4 ST (CAST SUPPLIES) ×1 IMPLANT
PADDING CAST COTTON 3X4 STRL (CAST SUPPLIES) ×2
PROBE BIPOLAR ARTHRO 85MM 30D (MISCELLANEOUS) ×1 IMPLANT
ROUTER HOODED VORTEX 2.9MM (BLADE) IMPLANT
SET ARTHROSCOPY TUBING (MISCELLANEOUS) ×2
SET ARTHROSCOPY TUBING LN (MISCELLANEOUS) IMPLANT
SET SM JOINT TUBING/CANN (CANNULA) IMPLANT
SLEEVE SCD COMPRESS KNEE MED (MISCELLANEOUS) ×2 IMPLANT
SPLINT PLASTER CAST XFAST 3X15 (CAST SUPPLIES) ×30 IMPLANT
SPLINT PLASTER XTRA FASTSET 3X (CAST SUPPLIES) ×30
STOCKINETTE 4X48 STRL (DRAPES) ×2 IMPLANT
STRIP CLOSURE SKIN 1/2X4 (GAUZE/BANDAGES/DRESSINGS) IMPLANT
STRIP CLOSURE SKIN 1/4X4 (GAUZE/BANDAGES/DRESSINGS) IMPLANT
SUCTION FRAZIER HANDLE 10FR (MISCELLANEOUS)
SUCTION TUBE FRAZIER 10FR DISP (MISCELLANEOUS) IMPLANT
SUT ETHILON 4 0 PS 2 18 (SUTURE) ×1 IMPLANT
SUT ETHILON 5 0 P 3 18 (SUTURE)
SUT MERSILENE 4 0 P 3 (SUTURE) IMPLANT
SUT NYLON ETHILON 5-0 P-3 1X18 (SUTURE) IMPLANT
SUT PDS AB 2-0 CT2 27 (SUTURE) IMPLANT
SUT STEEL 4 0 (SUTURE) IMPLANT
SUT VIC AB 2-0 PS2 27 (SUTURE) IMPLANT
SUT VICRYL 4-0 PS2 18IN ABS (SUTURE) IMPLANT
SYR BULB 3OZ (MISCELLANEOUS) ×2 IMPLANT
SYR CONTROL 10ML LL (SYRINGE) ×2 IMPLANT
TUBE CONNECTING 20X1/4 (TUBING) IMPLANT
UNDERPAD 30X30 (UNDERPADS AND DIAPERS) ×2 IMPLANT
WATER STERILE IRR 1000ML POUR (IV SOLUTION) ×2 IMPLANT

## 2016-07-14 NOTE — Anesthesia Procedure Notes (Signed)
Procedure Name: LMA Insertion Date/Time: 07/14/2016 1:58 PM Performed by: Melynda Ripple D Pre-anesthesia Checklist: Patient identified, Emergency Drugs available, Suction available and Patient being monitored Patient Re-evaluated:Patient Re-evaluated prior to inductionOxygen Delivery Method: Circle system utilized Preoxygenation: Pre-oxygenation with 100% oxygen Intubation Type: IV induction Ventilation: Mask ventilation without difficulty LMA: LMA inserted LMA Size: 5.0 Number of attempts: 1 Airway Equipment and Method: Bite block Placement Confirmation: positive ETCO2 Tube secured with: Tape Dental Injury: Teeth and Oropharynx as per pre-operative assessment

## 2016-07-14 NOTE — Op Note (Signed)
I assisted Surgeon(s) and Role:    * Leanora Cover, MD - Primary    * Daryll Brod, MD - Assisting on the Procedure(s): RIGHT WRIST ARTHROSCOPY WITH DEBRIDEMENT  TRIANGULAR FIBROCARTILAGE COMPLEX on 07/14/2016.  I provided assistance on this case as follows: setup,stabilization of the arm, interpretation of the pathology,treatment of the pathology, application of the dressings and splint. I was present for the entire case.  Electronically signed by: Wynonia Sours, MD Date: 07/14/2016 Time: 3:04 PM

## 2016-07-14 NOTE — Brief Op Note (Signed)
07/14/2016  3:07 PM  PATIENT:  Sean Hill  75 y.o. male  PRE-OPERATIVE DIAGNOSIS:  Right Wrist Triangular Fibrocartilage Complex Tear #S69.81XA  POST-OPERATIVE DIAGNOSIS:  Right Wrist Triangular Fibrocartilage Complex Tear   PROCEDURE:  Procedure(s): RIGHT WRIST ARTHROSCOPY WITH DEBRIDEMENT  TRIANGULAR FIBROCARTILAGE COMPLEX (Right)  SURGEON:  Surgeon(s) and Role:    * Leanora Cover, MD - Primary    * Daryll Brod, MD - Assisting  PHYSICIAN ASSISTANT:   ASSISTANTS: Daryll Brod, MD   ANESTHESIA:   regional and general  EBL:  Total I/O In: 900 [I.V.:900] Out: 5 [Blood:5]  BLOOD ADMINISTERED:none  DRAINS: none   LOCAL MEDICATIONS USED:  NONE  SPECIMEN:  No Specimen  DISPOSITION OF SPECIMEN:  N/A  COUNTS:  YES  TOURNIQUET:    DICTATION: .Other Dictation: Dictation Number [id number]  PLAN OF CARE: Discharge to home after PACU  PATIENT DISPOSITION:  PACU - hemodynamically stable.

## 2016-07-14 NOTE — Anesthesia Preprocedure Evaluation (Signed)
Anesthesia Evaluation  Patient identified by MRN, date of birth, ID band Patient awake    Reviewed: Allergy & Precautions, NPO status , Patient's Chart, lab work & pertinent test results  Airway Mallampati: II  TM Distance: >3 FB Neck ROM: Full    Dental  (+) Teeth Intact, Dental Advisory Given   Pulmonary asthma , sleep apnea and Continuous Positive Airway Pressure Ventilation ,    breath sounds clear to auscultation       Cardiovascular hypertension, Pt. on medications  Rhythm:Regular Rate:Normal     Neuro/Psych PSYCHIATRIC DISORDERS Anxiety Depression Bipolar Disorder    GI/Hepatic GERD  Medicated and Controlled,  Endo/Other  Morbid obesity  Renal/GU Renal disease     Musculoskeletal  (+) Arthritis ,   Abdominal   Peds  Hematology   Anesthesia Other Findings   Reproductive/Obstetrics                             Anesthesia Physical  Anesthesia Plan  ASA: III  Anesthesia Plan: General and Regional   Post-op Pain Management: GA combined w/ Regional for post-op pain   Induction: Intravenous  Airway Management Planned: LMA  Additional Equipment:   Intra-op Plan:   Post-operative Plan: Extubation in OR  Informed Consent: I have reviewed the patients History and Physical, chart, labs and discussed the procedure including the risks, benefits and alternatives for the proposed anesthesia with the patient or authorized representative who has indicated his/her understanding and acceptance.   Dental advisory given  Plan Discussed with: CRNA, Anesthesiologist and Surgeon  Anesthesia Plan Comments:         Anesthesia Quick Evaluation

## 2016-07-14 NOTE — H&P (Signed)
Sean Hill is an 75 y.o. male.   Chief Complaint: right wrist pain HPI: 75 yo male with pain in right wrist after distal radius fracture.  Pain localizes at ulnar styloid and radial side of wrist.  He wishes to have a wrist arthroscopy with debridement vs tfcc repair for management of symptoms.  Allergies:  Allergies  Allergen Reactions  . Codeine Shortness Of Breath  . Penicillins Hives, Rash and Other (See Comments)    Watery blisters Has patient had a PCN reaction causing immediate rash, facial/tongue/throat swelling, SOB or lightheadedness with hypotension: No Has patient had a PCN reaction causing severe rash involving mucus membranes or skin necrosis: No Has patient had a PCN reaction that required hospitalization No Has patient had a PCN reaction occurring within the last 10 years: No If all of the above answers are "NO", then may proceed with Cephalosporin use.     Past Medical History:  Diagnosis Date  . Anxiety   . Asthma    as child, no problems now  . Bipolar disorder [facility])    Has psychiatrist.  BH admission (suicidal) 07/20/11.  Marland Kitchen Cervical spondylosis    Primarily C4-5 and C5-6--referred to [facility] 02/2011.  . Depression   . GERD (gastroesophageal reflux disease)   . Hip pain, left 2011-2012   Left hip and groin: MRI pelvis and left hip 02/18/11 showed NORMAL hip, with small inguinal hernia containing fat and sigmoid diverticulosis.  Hip pain presumably referred pain from hernia/diverticular dz??.  . Hyperlipemia    Intol of meds: GI side effects  . Hypertension     no meds now  . Inguinal hernia 02/25/11   Left sided hernia with fat  . Left bundle branch block (LBBB) on electrocardiogram 10/29/2015   Rate related bundle branch block. Only noted once in February 2017.  Marland Kitchen Loss of hearing   . MRSA carrier    Intranasal bactroban tx 2011.  No hx of MRSA infection.  . Nephrolithiasis   . Overdose of benzodiazepine 04/30/2013   Status post  . Sleep apnea    states he has never had sleep study and only wife states he has OSA  . 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 ANKLE FRACTURE SURGERY     hardware still in both ankles (titanium)  . ANKLE SURGERY     Ankle tendon surgery  . CARDIAC CATHETERIZATION  1983   Angiographically normal coronary arteries. LVH noted.  . ELBOW SURGERY     left---tendon  . INGUINAL HERNIA REPAIR     Right side with mesh about 1993  . OPEN REDUCTION INTERNAL FIXATION (ORIF) DISTAL RADIAL FRACTURE Right 10/29/2015   Procedure: OPEN REDUCTION INTERNAL FIXATION (ORIF) DISTAL RADIAL FRACTURE;  Surgeon: Leanora Cover, MD;  Location: [place];  Service: Orthopedics;  Laterality: Right;  . TONSILLECTOMY    . TOTAL KNEE ARTHROPLASTY     Right and left (titanium)  . ULNAR COLLATERAL LIGAMENT REPAIR Right 12/29/2015   Procedure: REPAIR  RIGHT LATERAL ULNAR COLLATERAL LIGAMENT TEAR  EXTENSOR ORIGIN ;  Surgeon: Leanora Cover, MD;  Location: [facility];  Service: Orthopedics;  Laterality: Right;    Family History: Family History  Problem Relation Age of Onset  . Alcohol abuse Mother   . Ovarian cancer Mother   . Alcohol abuse Father   . Pancreatic cancer Father   .  Depression Daughter   . Paranoid behavior Daughter   . Hyperlipidemia Brother     Social History:   reports that he has never smoked. He has never used smokeless tobacco. He reports that he drinks alcohol. He reports that he does not use drugs.  Medications: Medications Prior to Admission  Medication Sig Dispense Refill  . ARIPiprazole ER (ABILIFY MAINTENA) 400 MG SUSR Inject 400 mg into the muscle every 30 (thirty) days. 1 each 0  . aspirin 81 MG tablet Take 81 mg by mouth daily.    Marland Kitchen atorvastatin (LIPITOR) 40 MG tablet Take 1 tablet (40 mg total) by mouth daily. 90 tablet 3  . benztropine (COGENTIN)  1 MG tablet TAKE 1 TABLET BY MOUTH DAILY. 30 tablet 1  . escitalopram (LEXAPRO) 20 MG tablet TAKE 1 TABLET EVERY DAY 30 tablet 1  . Flaxseed, Linseed, (FLAXSEED OIL PO) Take 1 tablet by mouth daily.    . Multiple Vitamin (MULTIVITAMIN WITH MINERALS) TABS tablet Take 1 tablet by mouth daily.    Marland Kitchen omeprazole (PRILOSEC) 40 MG capsule TAKE ONE CAPSULE BY MOUTH TWICE DAILY 60 capsule 5  . oxymetazoline (AFRIN) 0.05 % nasal spray Place 1 spray into both nostrils 2 (two) times daily as needed for congestion.     . temazepam (RESTORIL) 15 MG capsule Take 1 capsule (15 mg total) by mouth at bedtime as needed. for sleep 30 capsule 1  . traZODone (DESYREL) 100 MG tablet TAKE 1 TABLET (100 MG TOTAL) BY MOUTH AT BEDTIME. 30 tablet 1  . Vitamin D, Cholecalciferol, 1000 UNITS TABS Take 1 tablet by mouth daily.      No results found for this or any previous visit (from the past 48 hour(s)).  No results found.   A comprehensive review of systems was negative.  Blood pressure 126/72, pulse 65, temperature 97.9 F (36.6 C), temperature source Oral, resp. rate 15, height 5\' 10"  (1.778 m), weight 116.6 kg (257 lb), SpO2 97 %.  General appearance: alert, cooperative and appears stated age Head: Normocephalic, without obvious abnormality, atraumatic Neck: supple, symmetrical, trachea midline Resp: clear to auscultation bilaterally Cardio: regular rate and rhythm GI: non-tender Extremities: Intact sensation and capillary refill all digits.  +epl/fpl/io.  No wounds. Pulses: 2+ and symmetric Skin: Skin color, texture, turgor normal. No rashes or lesions Neurologic: Grossly normal Incision/Wound:none  Assessment/Plan Right wrist pain possible tfcc injury.  Plan wrist arthroscopy with debridement vs tfcc repair.  Non operative and operative treatment options were discussed with the patient and patient wishes to proceed with operative treatment. Risks, benefits, and alternatives of surgery were discussed and  the patient agrees with the plan of care.   [name] 07/14/2016, 1:35 PM

## 2016-07-14 NOTE — Discharge Instructions (Addendum)

## 2016-07-14 NOTE — Progress Notes (Signed)
Assisted Dr. [place] Pink with right, ultrasound guided, supraclavicular block. Side rails up, monitors on throughout procedure. See vital signs in flow sheet. Tolerated Procedure well.

## 2016-07-14 NOTE — Op Note (Signed)
[id number] 

## 2016-07-14 NOTE — Transfer of Care (Signed)
Immediate Anesthesia Transfer of Care Note  Patient: Sean Hill  Procedure(s) Performed: Procedure(s): RIGHT WRIST ARTHROSCOPY WITH DEBRIDEMENT  TRIANGULAR FIBROCARTILAGE COMPLEX (Right)  Patient Location: PACU  Anesthesia Type:GA combined with regional for post-op pain  Level of Consciousness: sedated  Airway & Oxygen Therapy: Patient Spontanous Breathing and Patient connected to face mask oxygen  Post-op Assessment: Report given to RN and Post -op Vital signs reviewed and stable  Post vital signs: Reviewed and stable  Last Vitals:  Vitals:   07/14/16 1330 07/14/16 1335  BP:  126/72  Pulse: 65 65  Resp: 15 15  Temp:      Last Pain:  Vitals:   07/14/16 1245  TempSrc: Oral  PainSc: 5          Complications: No apparent anesthesia complications

## 2016-07-14 NOTE — Anesthesia Procedure Notes (Signed)
Anesthesia Regional Block:  Supraclavicular block  Pre-Anesthetic Checklist: ,, timeout performed, Correct Patient, Correct Site, Correct Laterality, Correct Procedure, Correct Position, site marked, Risks and benefits discussed,  Surgical consent,  Pre-op evaluation,  At surgeon's request and post-op pain management  Laterality: Right  Prep: chloraprep       Needles:  Injection technique: Single-shot  Needle Type: Stimiplex     Needle Length: 5cm 5 cm Needle Gauge: 21 G    Additional Needles:  Procedures: ultrasound guided (picture in chart) Supraclavicular block Narrative:  Injection made incrementally with aspirations every 5 mL.  Performed by: Personally  Anesthesiologist: Humna Moorehouse  Additional Notes: Risks, benefits and alternative to block explained extensively.  Patient tolerated procedure well, without complications.

## 2016-07-15 ENCOUNTER — Encounter (HOSPITAL_BASED_OUTPATIENT_CLINIC_OR_DEPARTMENT_OTHER): Payer: Self-pay | Admitting: Orthopedic Surgery

## 2016-07-15 NOTE — Op Note (Signed)
NAMEBABACAR, BELL            ACCOUNT NO.:  [id number]  MEDICAL RECORD NO.:  [id number]  LOCATION:                                 FACILITY:  PHYSICIAN:  Leanora Cover, MD        DATE OF BIRTH:  1941-05-12  DATE OF PROCEDURE:  07/14/2016 DATE OF DISCHARGE:                              OPERATIVE REPORT   PREOPERATIVE DIAGNOSIS:  Right wrist pain with possible TFCC tear.  POSTOPERATIVE DIAGNOSIS:  Right wrist adhesions, posttraumatic arthritis.  PROCEDURE:  Right wrist arthroscopy with debridement and release of adhesions, intra-articular.  SURGEON:  Leanora Cover, MD.  ASSISTANT:  Daryll Brod, MD.  ANESTHESIA:  General with regional.  IV FLUIDS:  Per anesthesia flow sheet.  ESTIMATED BLOOD LOSS:  Minimal.  COMPLICATIONS:  None.  SPECIMENS:  None.  TOURNIQUET:  None.  DISPOSITION:  Stable to PACU.  INDICATIONS:  Mr. Clare is a 75 year old male, who sustained a distal radius fracture, which was repaired with open reduction and internal fixation.  He has continued to have pain in his wrist.  He has tried nonoperative modalities.  He wishes to have an arthroscopy for diagnosis and potential repair of TFCC as necessary.  Risks, benefits, and alternative of surgery were discussed including risk of blood loss; infection; damage to nerves, vessels, tendons, ligaments, bone; failure of surgery; need for additional surgery; complications with wound healing; continued pain.  He voiced understanding of these risks and elected to proceed.  OPERATIVE COURSE:  After being identified preoperatively by myself, the patient and I agreed upon procedure and site of procedure.  Surgical site was marked.  The risks, benefits, and alternatives of surgery were reviewed and he wished to proceed.  Surgical consent had been signed. He was given IV vancomycin as preoperative antibiotic prophylaxis.  He was transferred to the operating room and placed on the operating room table in supine  position with the right upper extremity on arm board. General anesthesia induced by Anesthesiology.  Right upper extremity was prepped and draped in normal sterile orthopedic fashion.  Surgical pause was performed between surgeons, anesthesia, and operating room staff; and all were in agreement as to the patient, procedure, and site of procedure.  Tourniquet at the proximal aspect of the extremity was never inflated.  The forearm was placed in the wrist arthroscopy tower.  The joint was insufflated with sterile saline.  Incision was made through the skin only and the 3/4 portal created.  The camera was introduced. The joint was examined.  There was synovitis and adhesions in several locations including at the radial side of the joint at the scapholunate interval.  The TFCC was patulous and loose.  It did not appear to have a peripheral tear.  There were fracture lines through the lunate facet with synovitis within.  The radiofrequency ablator was introduced through the 4/5 portal.  This was used to ablate the synovitis and release the adhesions at the radioscaphoid and scapholunate locations. Heat was used to shrink the patulous nature of the TFCC near its radial attachment.  The shaver was then introduced and used to debride the synovitis within the previous fracture sites.  The dorsal synovitis and at the  ulnar side of the wrist was treated with the radiofrequency ablator as well.  The midcarpal portal was then created and the camera introduced.  There was some synovitis here.  The articular cartilage within the midcarpal joint appeared intact.  The arthroscopic equipment was all removed.  The portals were closed with 4-0 nylon in a horizontal mattress fashion.  They were then dressed with sterile Xeroform, 4x4s, and wrapped with a Kerlix bandage.  A volar splint was placed and wrapped with Kerlix and Ace bandage.  Tourniquet had never been inflated.  The operative drapes were broken  down, and the patient was awakened from anesthesia safely.  He was transferred back to stretcher and taken to PACU in stable condition.  I will see him back in the office in 1 week for postoperative followup.  I will give him Norco 5/325 one to two p.o. q.6 hours p.r.n. pain, dispensed #20.     Leanora Cover, MD     KK/MEDQ  D:  07/14/2016  T:  07/15/2016  Job:  [id number]

## 2016-07-15 NOTE — Anesthesia Postprocedure Evaluation (Signed)
Anesthesia Post Note  Patient: JARREL MESSMAN  Procedure(s) Performed: Procedure(s) (LRB): RIGHT WRIST ARTHROSCOPY WITH DEBRIDEMENT  TRIANGULAR FIBROCARTILAGE COMPLEX (Right)  Patient location during evaluation: PACU Anesthesia Type: General and Regional Level of consciousness: awake and alert Pain management: pain level controlled Vital Signs Assessment: post-procedure vital signs reviewed and stable Respiratory status: spontaneous breathing, nonlabored ventilation, respiratory function stable and patient connected to nasal cannula oxygen Cardiovascular status: blood pressure returned to baseline and stable Postop Assessment: no signs of nausea or vomiting Anesthetic complications: no    Last Vitals:  Vitals:   07/14/16 1545 07/14/16 1630  BP: 129/78 123/82  Pulse: 70 70  Resp: 11 16  Temp:  36.7 C    Last Pain:  Vitals:   07/15/16 0947  TempSrc:   PainSc: 0-No pain                 Zenaida Deed

## 2016-07-21 ENCOUNTER — Other Ambulatory Visit (HOSPITAL_COMMUNITY): Payer: Self-pay | Admitting: Psychiatry

## 2016-07-25 ENCOUNTER — Ambulatory Visit (INDEPENDENT_AMBULATORY_CARE_PROVIDER_SITE_OTHER): Payer: PPO | Admitting: Cardiology

## 2016-07-25 ENCOUNTER — Encounter: Payer: Self-pay | Admitting: Cardiology

## 2016-07-25 DIAGNOSIS — E784 Other hyperlipidemia: Secondary | ICD-10-CM | POA: Diagnosis not present

## 2016-07-25 DIAGNOSIS — E7849 Other hyperlipidemia: Secondary | ICD-10-CM

## 2016-07-25 DIAGNOSIS — I447 Left bundle-branch block, unspecified: Secondary | ICD-10-CM | POA: Diagnosis not present

## 2016-07-25 NOTE — Progress Notes (Signed)
PCP: [name], NP  Clinic Note: Chief Complaint  Patient presents with  . Follow-up  . Edema    right feet    HPI: Sean Hill is a 75 y.o. male with a PMH below who presents today for 6 week follow-up after initial consultation for abnormal EKG.[name] was last seen on 06/10/2016 for initial cardiology consultation left bundle branch block. We did an echocardiogram which was essentially normal. He therefore went for his last surgery.  Recent Hospitalizations: For his wrist surgery 07/14/2016.  Studies Reviewed:  Echo10/02/2016: Normal LV function EF 60-65%. Grade 1 diastolic dysfunction. Otherwise essentially normal.  Interval History: Sean Hill presents today for follow-up after his surgery. He did well with the surgery without any problems. He has been totally asymptomatic from a cardiac standpoint. Has not been very active, and has gotten a little bit deconditioned. But he is still denying any resting or exertional chest tightness or pressure. He may get some exertional dyspnea if he forces himself to deconditioning. No palpitations, lightheadedness, dizziness, weakness or syncope/near syncope. No TIA/amaurosis fugax symptoms. No claudication.   ROS: A comprehensive was performed. Review of Systems  Constitutional: Negative.  Negative for malaise/fatigue and weight loss.  HENT: Negative.   Respiratory: Negative.   Cardiovascular: Negative.   Gastrointestinal: Positive for heartburn (Takes PPI). Negative for blood in stool, constipation and melena.  Musculoskeletal: Negative for back pain and falls.       Right wrist seems to be healing well. Less painful. Last surgery was successful arthroscopic surgery - apparently has maybe 1 more surgery left  Skin: Negative.   Neurological: Negative for dizziness, focal weakness and loss of consciousness.  Endo/Heme/Allergies: Negative for environmental allergies.  Psychiatric/Behavioral: Negative for memory  loss. The patient is not nervous/anxious and does not have insomnia.        Stable bipolar  All other systems reviewed and are negative.   Past Medical History:  Diagnosis Date  . Anxiety   . Asthma    as child, no problems now  . Bipolar disorder [facility])    Has psychiatrist.  BH admission (suicidal) 07/20/11.  Marland Kitchen Cervical spondylosis    Primarily C4-5 and C5-6--referred to [facility] 02/2011.  . Depression   . GERD (gastroesophageal reflux disease)   . Hip pain, left 2011-2012   Left hip and groin: MRI pelvis and left hip 02/18/11 showed NORMAL hip, with small inguinal hernia containing fat and sigmoid diverticulosis.  Hip pain presumably referred pain from hernia/diverticular dz??.  . Hyperlipemia    Intol of meds: GI side effects  . Hypertension     no meds now  . Inguinal hernia 02/25/11   Left sided hernia with fat  . Left bundle branch block (LBBB) on electrocardiogram 10/29/2015   Rate related bundle branch block. Only noted once in February 2017.  Marland Kitchen Loss of hearing   . MRSA carrier    Intranasal bactroban tx 2011.  No hx of MRSA infection.  . Nephrolithiasis   . Overdose of benzodiazepine 04/30/2013   Status post  . Sleep apnea    states he has never had sleep study and only wife states he has OSA  . 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 ANKLE FRACTURE SURGERY     hardware still in both ankles (titanium)  . ANKLE SURGERY     Ankle tendon  surgery  . CARDIAC CATHETERIZATION  1983   Angiographically normal coronary arteries. LVH noted.  . ELBOW SURGERY     left---tendon  . INGUINAL HERNIA REPAIR     Right side with mesh about 1993  . OPEN REDUCTION INTERNAL FIXATION (ORIF) DISTAL RADIAL FRACTURE Right 10/29/2015   Procedure: OPEN REDUCTION INTERNAL FIXATION (ORIF) DISTAL RADIAL FRACTURE;  Surgeon: Leanora Cover, MD;  Location: [place];  Service: Orthopedics;  Laterality: Right;  . TONSILLECTOMY    . TOTAL KNEE ARTHROPLASTY     Right and left (titanium)  . TRANSTHORACIC ECHOCARDIOGRAM  06/24/2016   Normal LV function EF 60-65%. Grade 1 diastolic dysfunction. Otherwise essentially normal.  . ULNAR COLLATERAL LIGAMENT REPAIR Right 12/29/2015   Procedure: REPAIR  RIGHT LATERAL ULNAR COLLATERAL LIGAMENT TEAR  EXTENSOR ORIGIN ;  Surgeon: Leanora Cover, MD;  Location: [place];  Service: Orthopedics;  Laterality: Right;  . WRIST ARTHROSCOPY WITH DEBRIDEMENT Right 07/14/2016   Procedure: RIGHT WRIST ARTHROSCOPY WITH DEBRIDEMENT  TRIANGULAR FIBROCARTILAGE COMPLEX;  Surgeon: Leanora Cover, MD;  Location: [place];  Service: Orthopedics;  Laterality: Right;    Prior to Admission medications   Medication Sig Start Date End Date Taking? Authorizing Provider  ARIPiprazole ER (ABILIFY MAINTENA) 400 MG SUSR Inject 400 mg into the muscle every 30 (thirty) days. 05/24/16  Yes Clarene Reamer, MD  aspirin 81 MG tablet Take 81 mg by mouth daily.   Yes Historical Provider, MD  atorvastatin (LIPITOR) 40 MG tablet Take 1 tablet (40 mg total) by mouth daily. 06/07/16  Yes [name], NP  benztropine (COGENTIN) 1 MG tablet TAKE 1 TABLET BY MOUTH DAILY. 06/30/16  Yes [name], MD  celecoxib (CELEBREX) 200 MG capsule Take 200 mg by mouth daily. 07/22/16  Yes Historical Provider, MD  escitalopram (LEXAPRO) 20 MG tablet TAKE 1 TABLET EVERY DAY 07/25/16  Yes [name], MD  Flaxseed, Linseed, (FLAXSEED OIL PO) Take 1 tablet by mouth daily.   Yes Historical Provider, MD  Multiple Vitamin (MULTIVITAMIN WITH MINERALS) TABS tablet Take 1 tablet by mouth daily.   Yes Historical Provider, MD  omeprazole (PRILOSEC) 40 MG capsule TAKE ONE CAPSULE BY MOUTH TWICE DAILY 06/29/16  Yes [name], NP  oxymetazoline (AFRIN) 0.05 % nasal spray Place 1 spray into both nostrils 2 (two) times daily as needed for congestion.     Yes Historical Provider, MD  temazepam (RESTORIL) 15 MG capsule Take 1 capsule (15 mg total) by mouth at bedtime as needed. for sleep 06/20/16  Yes [name], MD  traZODone (DESYREL) 100 MG tablet TAKE 1 TABLET (100 MG TOTAL) BY MOUTH AT BEDTIME. 04/06/16  Yes [name], MD  Vitamin D, Cholecalciferol, 1000 UNITS TABS Take 1 tablet by mouth daily.   Yes Historical Provider, MD   Allergies  Allergen Reactions  . Codeine Shortness Of Breath  . Penicillins Hives, Rash and Other (See Comments)    Watery blisters Has patient had a PCN reaction causing immediate rash, facial/tongue/throat swelling, SOB or lightheadedness with hypotension: No Has patient had a PCN reaction causing severe rash involving mucus membranes or skin necrosis: No Has patient had a PCN reaction that required hospitalization No Has patient had a PCN reaction occurring within the last 10 years: No If all of the above answers are "NO", then may proceed with Cephalosporin use.     Social History   Social History  . Marital status: Married  Spouse name: N/A  . Number of children: N/A  . Years of education: N/A   Social History Main Topics  . Smoking status: Never Smoker  . Smokeless tobacco: Never Used  . Alcohol use 0.0 oz/week     Comment: One drink rarely  . Drug use: No  . Sexual activity: Not Currently    Birth control/ protection: None     Comment: Same partner for 45 years   Other Topics Concern  . None   Social History Narrative   Married, lives in [place].  Retired Dance movement psychotherapist.   Two daughters, both married,3 grandchildren ( 74, 80, 1 year)    No regular exercise.  Never smoker.  No ETOH.  No drugs.    Family History  Problem Relation Age of Onset  . Alcohol abuse Mother   . Ovarian cancer Mother   . Alcohol abuse Father   . Pancreatic cancer Father   . Depression Daughter   . Paranoid behavior Daughter   . Hyperlipidemia Brother     Wt Readings from Last 3 Encounters:    07/25/16 119.3 kg (263 lb)  07/14/16 116.6 kg (257 lb)  06/10/16 118.5 kg (261 lb 3.2 oz)    PHYSICAL EXAM BP 131/73   Pulse 68   Ht 5\' 10"  (1.778 m)   Wt 119.3 kg (263 lb)   SpO2 94%   BMI 37.74 kg/m  General appearance: alert, cooperative, appears stated age, no distress and Moderate truncal obesity; somewhat blunted/flat affect but pleasant mood. Neck: no adenopathy, no carotid bruit and no JVD Lungs: clear to auscultation bilaterally, normal percussion bilaterally and non-labored Heart: Distant heart sounds. regular rate and rhythm, S1 and S2 normal, no murmur, click, rub or gallop; unable to palpate PMI Abdomen: soft, non-tender; bowel sounds normal; no masses,  no organomegaly; truncal obesity Extremities: extremities normal, atraumatic, no cyanosis, and edema trace - R wrist bandaid - sore with minimal swelling Pulses: 2+ and symmetric;  Neurologic: Mental status: Alert, oriented, thought content appropriate    Adult ECG Report Not checked  Other studies Reviewed: Additional studies/ records that were reviewed today include:  Recent Labs:  n/a    ASSESSMENT / PLAN: Problem List Items Addressed This Visit    Left bundle branch block (LBBB) on electrocardiogram (Chronic)    Only noted on one EKG back in February. I suspect that this may be a rate related bundle. Normal echocardiogram and no active cardiac symptoms. Probably not ischemic mediated based on his lack of symptoms.  Would monitor as rate related bundle-branch blocks tend to become chronic bundle-branch blocks. In the absence of symptoms, would not do any further evaluation.      Hyperlipidemia (Chronic)    On statin. Monitored by PCP.         Current medicines are reviewed at length with the patient today. (+/- concerns) none The following changes have been made:   Patient Instructions  NO CHANGE IN CURRENT MEDICATIONS   Your physician wants you to follow-up in: [place]. You  will receive a reminder letter in the mail two months in advance. If you don't receive a letter, please call our office to schedule the follow-up appointment.   If you need a refill on your cardiac medications before your next appointment, please call your pharmacy.    Studies Ordered:   No orders of the defined types were placed in this encounter.     Glenetta Hew, M.D., M.S. Interventional Cardiologist  Pager # [phone] Phone # [phone] [address]. [place], [place] [address]

## 2016-07-25 NOTE — Patient Instructions (Signed)
NO CHANGE IN CURRENT MEDICATIONS  Your physician wants you to follow-up in 12 MONTHS WITH DR HARDING.  You will receive a reminder letter in the mail two months in advance. If you don't receive a letter, please call our office to schedule the follow-up appointment.  If you need a refill on your cardiac medications before your next appointment, please call your pharmacy.   

## 2016-07-26 DIAGNOSIS — F319 Bipolar disorder, unspecified: Secondary | ICD-10-CM | POA: Diagnosis not present

## 2016-07-26 DIAGNOSIS — F411 Generalized anxiety disorder: Secondary | ICD-10-CM | POA: Diagnosis not present

## 2016-07-27 ENCOUNTER — Ambulatory Visit (INDEPENDENT_AMBULATORY_CARE_PROVIDER_SITE_OTHER): Payer: PPO

## 2016-07-27 ENCOUNTER — Encounter: Payer: Self-pay | Admitting: Cardiology

## 2016-07-27 DIAGNOSIS — F3131 Bipolar disorder, current episode depressed, mild: Secondary | ICD-10-CM

## 2016-07-27 NOTE — Progress Notes (Signed)
Patient presented for injection of Abilify Maintena 400 mgwith appropriate affect, level mood and denied any current AH/VH Denies any racing thoughts or problems with mood.  Patient reported no current problems with medications and no other concerns. Abilify Maintena 400 mg was prepared as ordered and administered as ordered IM in patient's left upper outer gluteal quadrant. Patient tolerated injection without complaint of pain or discomfort and patient to return in 30 days for next due injection. Patient agreed to come back in 30 days and to call with any problems or questions

## 2016-07-27 NOTE — Assessment & Plan Note (Signed)
On statin. Monitored by PCP. 

## 2016-07-27 NOTE — Assessment & Plan Note (Signed)
Only noted on one EKG back in February. I suspect that this may be a rate related bundle. Normal echocardiogram and no active cardiac symptoms. Probably not ischemic mediated based on his lack of symptoms.  Would monitor as rate related bundle-branch blocks tend to become chronic bundle-branch blocks. In the absence of symptoms, would not do any further evaluation.

## 2016-07-28 MED ORDER — ARIPIPRAZOLE ER 400 MG IM PRSY
400.0000 mg | PREFILLED_SYRINGE | INTRAMUSCULAR | Status: DC
  Administered 2016-07-27 – 2017-10-25 (×17): 400 mg via INTRAMUSCULAR

## 2016-07-29 DIAGNOSIS — M19131 Post-traumatic osteoarthritis, right wrist: Secondary | ICD-10-CM | POA: Diagnosis not present

## 2016-07-29 DIAGNOSIS — M1811 Unilateral primary osteoarthritis of first carpometacarpal joint, right hand: Secondary | ICD-10-CM | POA: Diagnosis not present

## 2016-07-31 ENCOUNTER — Other Ambulatory Visit (HOSPITAL_COMMUNITY): Payer: Self-pay | Admitting: Psychiatry

## 2016-08-09 ENCOUNTER — Ambulatory Visit (HOSPITAL_COMMUNITY): Payer: Self-pay | Admitting: Psychiatry

## 2016-08-10 ENCOUNTER — Encounter (HOSPITAL_COMMUNITY): Payer: Self-pay | Admitting: Psychiatry

## 2016-08-10 ENCOUNTER — Ambulatory Visit (INDEPENDENT_AMBULATORY_CARE_PROVIDER_SITE_OTHER): Payer: PPO | Admitting: Psychiatry

## 2016-08-10 VITALS — BP 126/78 | HR 72 | Ht 70.0 in | Wt 260.0 lb

## 2016-08-10 DIAGNOSIS — F319 Bipolar disorder, unspecified: Secondary | ICD-10-CM | POA: Diagnosis not present

## 2016-08-10 MED ORDER — TEMAZEPAM 15 MG PO CAPS: 15.0000 mg | ORAL_CAPSULE | Freq: Every evening | ORAL | 2 refills | Status: DC | PRN

## 2016-08-10 MED ORDER — ESCITALOPRAM OXALATE 20 MG PO TABS: 20.0000 mg | ORAL_TABLET | Freq: Every day | ORAL | 2 refills | Status: DC

## 2016-08-10 MED ORDER — BENZTROPINE MESYLATE 1 MG PO TABS: 1.0000 mg | ORAL_TABLET | Freq: Every day | ORAL | 2 refills | Status: DC

## 2016-08-10 MED ORDER — TRAZODONE HCL 100 MG PO TABS: 100.0000 mg | ORAL_TABLET | Freq: Every day | ORAL | 2 refills | Status: DC

## 2016-08-10 NOTE — Progress Notes (Signed)
[facility] [phone] Progress Note  EUGENE SHADOWENS [id number] 75 y.o.  08/10/2016 4:14 PM  Chief Complaint:  I have been feeling more anxious and depressed.  My pain in her right hand is not getting better.  I may need another surgery.                History of Present Illness: Noey came for his follow-up appointment.  He is taking his medication but lately noticed more pain in his right hand.  He may need another surgery in coming weeks.  He recently seen cardiologist for the clearance.  He had EKG which shows initially left bundle branch block but repeated EKG were normal.  His starting his medication and reported no side effects.  He continues to take temazepam Abilify trazodone Lexapro and Cogentin.  She denies taking alcohol using any illegal substances.  His appetite is okay.  His vital signs are stable.  He lives with his wife is very supportive.  At this time he like to wait to change his medication or less out of the surgery he continued to feel sad and depressed.  Suicidal Ideation: No Plan Formed: No Patient has means to carry out plan: No  Homicidal Ideation: No Plan Formed: No Patient has means to carry out plan: No  Review of Systems  Constitutional: Negative.   HENT: Negative.   Respiratory: Negative.   Musculoskeletal: Positive for joint pain.       Right joint pain in his hand  Skin: Negative.   Neurological: Negative.   Psychiatric/Behavioral: Positive for depression. The patient is nervous/anxious.     Psychiatric: Agitation: No Hallucination: No Depressed Mood: No Insomnia: No Hypersomnia: No Altered Concentration: No Feels Worthless: No Grandiose Ideas: No Belief In Special Powers: No New/Increased Substance Abuse: No Compulsions: No  Neurologic: Headache: No Seizure: No Paresthesias: No  Medical History:  Patient has a history of high triglycerides, irritable bowel syndrome, chronic pain, GERD.  His primary care physician is Dr.  Shawna Orleans.  Psychosocial history.  Patient is born [place]. His been married once. He has 2 daughters. Patient lives with his wife. His daughter lives close by. He has good relationship with his daughter.  Past Psychiatric History/Hospitalization(s): Patient has at least 4-5 psychiatric hospitalization.  His last psychiatric admission was in July 2014 .  He has a history of taking overdose on his medication and mixing with alcohol. He has a history of cutting his wrist.  He has taken Tegretol, Depakote, Ambien, Remeron, Vistaril , Geodon, Abilify, lithium, Provigil, Zoloft, Neurontin , Lamictal, Wellbutrin , Risperdal and Prstiq.  He had overdose on Latuda. Patient endorsed history of mania and severe impulsive behavior. He admitted history of buying unnecessary, getting speeding tickets and aggression.  Patient reported history of side effects due to polypharmacy.   Anxiety: Yes Bipolar Disorder: Yes Depression: Yes Mania: Yes Psychosis: Yes Schizophrenia: No Personality Disorder: No Hospitalization for psychiatric illness: Yes History of Electroconvulsive Shock Therapy: No Prior Suicide Attempts: Yes  Physical Exam: Constitutional:  BP 126/78   Pulse 72   Ht 5\' 10"  (1.778 m)   Wt 260 lb (117.9 kg)   BMI 37.31 kg/m    No results found for this or any previous visit (from the past 2160 hour(s)).  General Appearance: well nourished and obese  Musculoskeletal: Strength & Muscle Tone: within normal limits Gait & Station: normal Patient leans: Patient has normal posture  Mental status examination Patient is a middle-aged man  who is casually dressed and groomed.  He is pleasant, cooperative and maintained good eye contact.  His speech is slow, clear, coherent with normal rate and volume.  He is using splint on his right hand .  He described his mood anxious and his affect is constricted.  There were no delusions, paranoia or any obsessive thoughts.  Patient denies  any active or passive suicidal thoughts or homicidal thought.  There were no flight of ideas or any loose association.  His psychomotor activity is slow.  His fund of knowledge is adequate.  He is alert and oriented 3.  His insight judgment and impulse control is okay.  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 Preoperative cardiovascular examination 06/10/2016  . Left bundle branch block (LBBB) on electrocardiogram 06/10/2016  . Hyperlipidemia 06/07/2016  . Left leg swelling 01/06/2015  . Change in bowel habits 01/06/2015  . Gastroesophageal reflux disease 11/28/2011  . Bipolar disorder ([place]) 11/25/2011    Plan:  I discussed in detail about his current situation.  We will not change his medication since patient is scheduled to have surgery in coming weeks.  However if depression and anxiety continue to persist then we will adjust the medication.  Patient has the past clinical dry mouth with increasing the dose of medication.  At this time he is comfortable on his current medication.  He will continue Abilify injection 400 mg intramuscular every 4 weeks, Cogentin 1 mg at bedtime, temazepam 15 mg at bedtime, trazodone 100 mg at bedtime.  He continued to see [facility] for counseling.  Follow-up in 3 months. Discussed medication side effects and benefits.  Recommended to call us back if there is any question, concern or worsening of the symptoms.  Discuss safety plan that anytime having active suicidal thoughts or homicidal thoughts and she need to call 911 or go to the local emergency room. Rayford Williamsen T.,  MD 08/10/2016

## 2016-08-18 ENCOUNTER — Other Ambulatory Visit (INDEPENDENT_AMBULATORY_CARE_PROVIDER_SITE_OTHER): Payer: PPO

## 2016-08-18 DIAGNOSIS — Z125 Encounter for screening for malignant neoplasm of prostate: Secondary | ICD-10-CM

## 2016-08-18 DIAGNOSIS — Z0000 Encounter for general adult medical examination without abnormal findings: Secondary | ICD-10-CM

## 2016-08-18 LAB — POC URINALSYSI DIPSTICK (AUTOMATED)
BILIRUBIN UA: NEGATIVE
Blood, UA: NEGATIVE
Glucose, UA: NEGATIVE
KETONES UA: NEGATIVE
LEUKOCYTES UA: NEGATIVE
Nitrite, UA: NEGATIVE
PH UA: 6.5
Protein, UA: NEGATIVE
SPEC GRAV UA: 1.01
Urobilinogen, UA: 0.2

## 2016-08-18 LAB — BASIC METABOLIC PANEL
BUN: 12 mg/dL (ref 6–23)
CALCIUM: 9.4 mg/dL (ref 8.4–10.5)
CO2: 25 mEq/L (ref 19–32)
CREATININE: 0.9 mg/dL (ref 0.40–1.50)
Chloride: 104 mEq/L (ref 96–112)
GFR: 87.75 mL/min (ref >60.00)
GLUCOSE: 113 mg/dL — AB (ref 70–99)
POTASSIUM: 4.1 meq/L (ref 3.5–5.1)
Sodium: 139 mEq/L (ref 135–145)

## 2016-08-18 LAB — HEPATIC FUNCTION PANEL
ALT: 29 U/L (ref 0–53)
AST: 35 U/L (ref 0–37)
Albumin: 4.1 g/dL (ref 3.5–5.2)
Alkaline Phosphatase: 89 U/L (ref 39–117)
BILIRUBIN DIRECT: 0.1 mg/dL (ref 0.0–0.3)
BILIRUBIN TOTAL: 0.6 mg/dL (ref 0.2–1.2)
Total Protein: 6.6 g/dL (ref 6.0–8.3)

## 2016-08-18 LAB — LIPID PANEL
CHOL/HDL RATIO: 5
CHOLESTEROL: 166 mg/dL (ref 0–200)
HDL: 32.3 mg/dL — ABNORMAL LOW (ref >39.00)

## 2016-08-18 LAB — CBC WITH DIFFERENTIAL/PLATELET
BASOS ABS: 0 10*3/uL (ref 0.0–0.1)
Basophils Relative: 0.6 % (ref 0.0–3.0)
EOS ABS: 0.1 10*3/uL (ref 0.0–0.7)
EOS PCT: 1.6 % (ref 0.0–5.0)
HCT: 46 % (ref 39.0–52.0)
HEMOGLOBIN: 15.7 g/dL (ref 13.0–17.0)
LYMPHS ABS: 1.9 10*3/uL (ref 0.7–4.0)
Lymphocytes Relative: 33.1 % (ref 12.0–46.0)
MCHC: 34.1 g/dL (ref 30.0–36.0)
MCV: 86.1 fl (ref 78.0–100.0)
MONO ABS: 0.4 10*3/uL (ref 0.1–1.0)
Monocytes Relative: 7.3 % (ref 3.0–12.0)
NEUTROS PCT: 57.4 % (ref 43.0–77.0)
Neutro Abs: 3.3 10*3/uL (ref 1.4–7.7)
Platelets: 213 10*3/uL (ref 150.0–400.0)
RBC: 5.34 Mil/uL (ref 4.22–5.81)
RDW: 14.2 % (ref 11.5–15.5)
WBC: 5.7 10*3/uL (ref 4.0–10.5)

## 2016-08-18 LAB — PSA: PSA: 0.25 ng/mL (ref 0.10–4.00)

## 2016-08-18 LAB — TSH: TSH: 2.09 u[IU]/mL (ref 0.35–4.50)

## 2016-08-18 LAB — LDL CHOLESTEROL, DIRECT: Direct LDL: 46 mg/dL

## 2016-08-23 ENCOUNTER — Ambulatory Visit (INDEPENDENT_AMBULATORY_CARE_PROVIDER_SITE_OTHER): Payer: PPO | Admitting: Adult Health

## 2016-08-23 ENCOUNTER — Encounter: Payer: Self-pay | Admitting: Adult Health

## 2016-08-23 VITALS — BP 164/70 | Temp 97.9°F | Ht 70.0 in | Wt 261.0 lb

## 2016-08-23 DIAGNOSIS — I10 Essential (primary) hypertension: Secondary | ICD-10-CM | POA: Diagnosis not present

## 2016-08-23 DIAGNOSIS — Z0000 Encounter for general adult medical examination without abnormal findings: Secondary | ICD-10-CM | POA: Diagnosis not present

## 2016-08-23 DIAGNOSIS — R7309 Other abnormal glucose: Secondary | ICD-10-CM

## 2016-08-23 DIAGNOSIS — E784 Other hyperlipidemia: Secondary | ICD-10-CM | POA: Diagnosis not present

## 2016-08-23 DIAGNOSIS — E7849 Other hyperlipidemia: Secondary | ICD-10-CM

## 2016-08-23 LAB — POCT GLYCOSYLATED HEMOGLOBIN (HGB A1C): HEMOGLOBIN A1C: 6.1

## 2016-08-23 MED ORDER — FENOFIBRATE 145 MG PO TABS: 145.0000 mg | ORAL_TABLET | Freq: Every day | ORAL | 3 refills | Status: DC

## 2016-08-23 MED ORDER — LISINOPRIL 20 MG PO TABS: 20.0000 mg | ORAL_TABLET | Freq: Every day | ORAL | 1 refills | Status: DC

## 2016-08-23 NOTE — Patient Instructions (Addendum)
It was great seeing you today!  I have started you on a two new medications.  1. Lisinopril 20 mg - this is to control your blood pressure 2. Fenofibrate - take this with lipitor to help bring down your triglyceride levels  I would like to see you back in one month to make sure your blood pressure is going down   I would also like to see you back in six months to recheck your lipid panel  Your A1c is 6.1 - you are creeping into the diabetic range

## 2016-08-23 NOTE — Progress Notes (Signed)
Subjective:    Patient ID: Sean Hill, male    DOB: Jan 10, 1941, 75 y.o.   MRN: [id number]  HPI  Patient presents for yearly preventative medicine examination. He is a pleasant 75 year old male who  has a past medical history of Anxiety; Asthma; Bipolar disorder ([place]); Cervical spondylosis; Depression; GERD (gastroesophageal reflux disease); Hip pain, left (2011-2012); Hyperlipemia; Hypertension; Inguinal hernia (02/25/11); Left bundle branch block (LBBB) on electrocardiogram (10/29/2015); Loss of hearing; MRSA carrier; Nephrolithiasis; Overdose of benzodiazepine (04/30/2013); Sleep apnea; and Tinnitus.  All immunizations and health maintenance protocols were reviewed with the patient and needed orders were placed.  Medication reconciliation,  past medical history, social history, problem list and allergies were reviewed in detail with the patient  Goals were established with regard to weight loss, exercise, and  diet in compliance with medications  His only interval history is that of a right wrist arthroscopy with debridement and release of adhesions. This procedure was done on 07/14/2016. He continues to wear a soft splint and endorses that he continues to have pain in his right wrist. He does have a follow up appointment with orthopedics coming up.   Review of Systems  Constitutional: Negative.   HENT: Negative.   Eyes: Negative.   Respiratory: Negative.   Cardiovascular: Negative.   Gastrointestinal: Negative.   Endocrine: Negative.   Genitourinary: Negative.   Musculoskeletal: Negative.   Allergic/Immunologic: Negative.   Neurological: Negative.   Hematological: Negative.   Psychiatric/Behavioral: Negative.   All other systems reviewed and are negative.  Past Medical History:  Diagnosis Date  . Anxiety   . Asthma    as child, no problems now  . Bipolar disorder [facility])    Has psychiatrist.  BH admission (suicidal) 07/20/11.  Marland Kitchen Cervical spondylosis    Primarily C4-5  and C5-6--referred to [facility] 02/2011.  . Depression   . GERD (gastroesophageal reflux disease)   . Hip pain, left 2011-2012   Left hip and groin: MRI pelvis and left hip 02/18/11 showed NORMAL hip, with small inguinal hernia containing fat and sigmoid diverticulosis.  Hip pain presumably referred pain from hernia/diverticular dz??.  . Hyperlipemia    Intol of meds: GI side effects  . Hypertension     no meds now  . Inguinal hernia 02/25/11   Left sided hernia with fat  . Left bundle branch block (LBBB) on electrocardiogram 10/29/2015   Rate related bundle branch block. Only noted once in February 2017.  Marland Kitchen Loss of hearing   . MRSA carrier    Intranasal bactroban tx 2011.  No hx of MRSA infection.  . Nephrolithiasis   . Overdose of benzodiazepine 04/30/2013   Status post  . Sleep apnea    states he has never had sleep study and only wife states he has OSA  . 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 Marital status: Married    Spouse name: N/A  . Number of children: N/A  . Years of education: N/A   Occupational History  . Not on file.   Social History Main Topics  . Smoking status: Never Smoker  . Smokeless tobacco: Never Used  . Alcohol use 0.0 oz/week     Comment: One drink rarely  . Drug use: No  . Sexual activity: Not Currently    Birth control/ protection: None     Comment: Same partner for 45 years  Other Topics Concern  . Not on file   Social History Narrative   Married, lives in [place].  Retired Dance movement psychotherapist.   Two daughters, both married,3 grandchildren ( 65, 68, 1 year)    No regular exercise.  Never smoker.  No ETOH.  No drugs.    Past Surgical History:  Procedure Laterality Date  . ANKLE FRACTURE SURGERY     hardware still in both ankles (titanium)  . ANKLE SURGERY     Ankle tendon surgery  . CARDIAC CATHETERIZATION  1983   Angiographically  normal coronary arteries. LVH noted.  . ELBOW SURGERY     left---tendon  . INGUINAL HERNIA REPAIR     Right side with mesh about 1993  . OPEN REDUCTION INTERNAL FIXATION (ORIF) DISTAL RADIAL FRACTURE Right 10/29/2015   Procedure: OPEN REDUCTION INTERNAL FIXATION (ORIF) DISTAL RADIAL FRACTURE;  Surgeon: Leanora Cover, MD;  Location: [place];  Service: Orthopedics;  Laterality: Right;  . TONSILLECTOMY    . TOTAL KNEE ARTHROPLASTY     Right and left (titanium)  . TRANSTHORACIC ECHOCARDIOGRAM  06/24/2016   Normal LV function EF 60-65%. Grade 1 diastolic dysfunction. Otherwise essentially normal.  . ULNAR COLLATERAL LIGAMENT REPAIR Right 12/29/2015   Procedure: REPAIR  RIGHT LATERAL ULNAR COLLATERAL LIGAMENT TEAR  EXTENSOR ORIGIN ;  Surgeon: Leanora Cover, MD;  Location: [place];  Service: Orthopedics;  Laterality: Right;  . WRIST ARTHROSCOPY WITH DEBRIDEMENT Right 07/14/2016   Procedure: RIGHT WRIST ARTHROSCOPY WITH DEBRIDEMENT  TRIANGULAR FIBROCARTILAGE COMPLEX;  Surgeon: Leanora Cover, MD;  Location: [place];  Service: Orthopedics;  Laterality: Right;    Family History  Problem Relation Age of Onset  . Alcohol abuse Mother   . Ovarian cancer Mother   . Alcohol abuse Father   . Pancreatic cancer Father   . Depression Daughter   . Paranoid behavior Daughter   . Hyperlipidemia Brother     Allergies  Allergen Reactions  . Codeine Shortness Of Breath  . Penicillins Hives, Rash and Other (See Comments)    Watery blisters Has patient had a PCN reaction causing immediate rash, facial/tongue/throat swelling, SOB or lightheadedness with hypotension: No Has patient had a PCN reaction causing severe rash involving mucus membranes or skin necrosis: No Has patient had a PCN reaction that required hospitalization No Has patient had a PCN reaction occurring within the last 10 years: No If all of the above answers are "NO", then may proceed with  Cephalosporin use.     Current Outpatient Prescriptions on File Prior to Visit  Medication Sig Dispense Refill  . ARIPiprazole ER (ABILIFY MAINTENA) 400 MG SUSR Inject 400 mg into the muscle every 30 (thirty) days. 1 each 0  . aspirin 81 MG tablet Take 81 mg by mouth daily.    . benztropine (COGENTIN) 1 MG tablet Take 1 tablet (1 mg total) by mouth daily. 30 tablet 2  . celecoxib (CELEBREX) 200 MG capsule Take 200 mg by mouth daily.    Marland Kitchen escitalopram (LEXAPRO) 20 MG tablet Take 1 tablet (20 mg total) by mouth daily. 30 tablet 2  . Flaxseed, Linseed, (FLAXSEED OIL PO) Take 1 tablet by mouth daily.    . Multiple Vitamin (MULTIVITAMIN WITH MINERALS) TABS tablet Take 1 tablet by mouth daily.    Marland Kitchen omeprazole (PRILOSEC) 40 MG capsule TAKE ONE CAPSULE BY MOUTH TWICE DAILY 60 capsule 5  . oxymetazoline (AFRIN) 0.05 % nasal spray Place 1 spray into both nostrils 2 (two)  times daily as needed for congestion.     . temazepam (RESTORIL) 15 MG capsule Take 1 capsule (15 mg total) by mouth at bedtime as needed. for sleep 30 capsule 2  . traZODone (DESYREL) 100 MG tablet Take 1 tablet (100 mg total) by mouth at bedtime. 30 tablet 2  . Vitamin D, Cholecalciferol, 1000 UNITS TABS Take 1 tablet by mouth daily.    Marland Kitchen atorvastatin (LIPITOR) 40 MG tablet Take 1 tablet (40 mg total) by mouth daily. (Patient not taking: Reported on 08/23/2016) 90 tablet 3   Current Facility-Administered Medications on File Prior to Visit  Medication Dose Route Frequency Provider Last Rate Last Dose  . ARIPiprazole ER PRSY 400 mg  400 mg Intramuscular Q28 days [name], MD   400 mg at 07/27/16 0914    BP (!) 164/70   Temp 97.9 F (36.6 C) (Oral)   Ht 5\' 10"  (1.778 m)   Wt 261 lb (118.4 kg)   BMI 37.45 kg/m       Objective:   Physical Exam  Constitutional: He is oriented to person, place, and time. He appears well-developed and well-nourished. No distress.  obese  HENT:  Head: Normocephalic and atraumatic.  Right  Ear: External ear normal.  Left Ear: External ear normal.  Nose: Nose normal.  Mouth/Throat: Oropharynx is clear and moist.  Eyes: Conjunctivae and EOM are normal. Pupils are equal, round, and reactive to light. Right eye exhibits no discharge. Left eye exhibits no discharge. No scleral icterus.  Neck: Normal range of motion. Neck supple. No JVD present. No tracheal deviation present. No thyromegaly present.  Cardiovascular: Normal rate, regular rhythm, normal heart sounds and intact distal pulses.  Exam reveals no gallop and no friction rub.   No murmur heard. Pulmonary/Chest: Effort normal and breath sounds normal. No stridor. No respiratory distress. He has no wheezes. He has no rales. He exhibits no tenderness.  Abdominal: Soft. Bowel sounds are normal. He exhibits no distension and no mass. There is no tenderness. There is no rebound and no guarding.  Musculoskeletal: Normal range of motion. He exhibits no edema, tenderness or deformity.  Lymphadenopathy:    He has no cervical adenopathy.  Neurological: He is alert and oriented to person, place, and time. He has normal reflexes. He displays normal reflexes. No cranial nerve deficit. He exhibits normal muscle tone. Coordination normal.  Skin: Skin is warm and dry. No rash noted. No erythema. No pallor.  Surgical scars on bilateral knees  Psychiatric: He has a normal mood and affect. His behavior is normal. Judgment and thought content normal.  Nursing note and vitals reviewed.     Assessment & Plan:  1. Routine general medical examination at a health care facility - Reviewed labs in detail with patient and all questions answered - Educated on the importance of diet and exercise  - Follow up in one year for CPE  2. Elevated glucose - POC HgB A1c- 6.1 - Educated on the importance of diet and exercise to lower diabetes risk  3. Other hyperlipidemia - Continue with statin - fenofibrate (TRICOR) 145 MG tablet; Take 1 tablet (145 mg  total) by mouth daily.  Dispense: 90 tablet; Refill: 3 - Decrease transfats in diet.  - Follow up in 6 months  Lab Results  Component Value Date   CHOL 166 08/18/2016   HDL 32.30 (L) 08/18/2016   LDLDIRECT 46.0 08/18/2016   TRIG (H) 08/18/2016    667.0 Triglyceride is over 400; calculations  on Lipids are invalid.   CHOLHDL 5 08/18/2016    4. Essential hypertension - Would like him to start monitoring his blood pressure at home and bring log to appointment in one month  - lisinopril (PRINIVIL,ZESTRIL) 20 MG tablet; Take 1 tablet (20 mg total) by mouth daily.  Dispense: 30 tablet; Refill: 1  [name], NP

## 2016-08-25 ENCOUNTER — Ambulatory Visit (INDEPENDENT_AMBULATORY_CARE_PROVIDER_SITE_OTHER): Payer: PPO

## 2016-08-25 DIAGNOSIS — F3170 Bipolar disorder, currently in remission, most recent episode unspecified: Secondary | ICD-10-CM | POA: Diagnosis not present

## 2016-08-25 DIAGNOSIS — F411 Generalized anxiety disorder: Secondary | ICD-10-CM | POA: Diagnosis not present

## 2016-08-25 DIAGNOSIS — F3131 Bipolar disorder, current episode depressed, mild: Secondary | ICD-10-CM

## 2016-08-25 DIAGNOSIS — F319 Bipolar disorder, unspecified: Secondary | ICD-10-CM | POA: Diagnosis not present

## 2016-08-25 NOTE — Progress Notes (Signed)
Patient presented for injection of Abilify Maintena 400 mg with appropriate affect, level mood and denied any current AH/VHDenies any racing thoughts or problems with mood. Patient reported no current problems with medications and no other concerns. Abilify Maintena 400 mg was prepared as orderedand administered as ordered IMin patient's right upper outergluteal quadrant. Patient tolerated injection without complaint of pain or discomfort and patient to return in 30 days for next due injection. Patient agreed to come back in 30 days and to call with any problems or questions

## 2016-08-26 DIAGNOSIS — M1811 Unilateral primary osteoarthritis of first carpometacarpal joint, right hand: Secondary | ICD-10-CM | POA: Diagnosis not present

## 2016-08-30 ENCOUNTER — Other Ambulatory Visit: Payer: Self-pay | Admitting: Orthopedic Surgery

## 2016-09-02 ENCOUNTER — Other Ambulatory Visit (HOSPITAL_COMMUNITY): Payer: Self-pay | Admitting: Psychiatry

## 2016-09-05 ENCOUNTER — Telehealth: Payer: Self-pay | Admitting: *Deleted

## 2016-09-05 NOTE — Telephone Encounter (Signed)
He was finding her last surgery. He is fine to the surgery too.  Glenetta Hew, MD

## 2016-09-05 NOTE — Telephone Encounter (Signed)
Request for surgical clearance:  1. What type of surgery is being performed? RIGHT THUMB CARPOMETACARPAL ARTHOPLASTY  2. When is this surgery scheduled? 09/22/2016  3. Are there any medications that need to be held prior to surgery and how long? ASPIRIN  4. Name of physician performing surgery?  DR Leanora Cover  5. What is your office phone and fax number? FAX [place] [email or web address]

## 2016-09-06 NOTE — Telephone Encounter (Signed)
Routed information to office Dr Fredna Dow

## 2016-09-16 ENCOUNTER — Encounter (HOSPITAL_BASED_OUTPATIENT_CLINIC_OR_DEPARTMENT_OTHER): Payer: Self-pay | Admitting: *Deleted

## 2016-09-16 NOTE — Progress Notes (Signed)
Patient had new LBBB noted on EKG, has already  been evaluated by Dr Ellyn Hack and had normal ECHO. No problems reported by patient.

## 2016-09-16 NOTE — Progress Notes (Signed)
   09/16/16 0905  OBSTRUCTIVE SLEEP APNEA  Have you ever been diagnosed with sleep apnea through a sleep study? No  Do you snore loudly (loud enough to be heard through closed doors)?  1  Do you often feel tired, fatigued, or sleepy during the daytime (such as falling asleep during driving or talking to someone)? 0  Has anyone observed you stop breathing during your sleep? 0  Do you have, or are you being treated for high blood pressure? 1  BMI more than 35 kg/m2? 1  Age > 50 (1-yes) 1  Neck circumference greater than:Male 16 inches or larger, Male 17inches or larger? 0  Male Gender (Yes=1) 1  Obstructive Sleep Apnea Score 5

## 2016-09-22 ENCOUNTER — Encounter (HOSPITAL_BASED_OUTPATIENT_CLINIC_OR_DEPARTMENT_OTHER): Payer: Self-pay | Admitting: Anesthesiology

## 2016-09-22 ENCOUNTER — Encounter (HOSPITAL_BASED_OUTPATIENT_CLINIC_OR_DEPARTMENT_OTHER)
Admission: RE | Disposition: A | Discharge status: Home / Self Care | Payer: Self-pay | Source: Ambulatory Visit | Attending: Orthopedic Surgery

## 2016-09-22 ENCOUNTER — Ambulatory Visit (HOSPITAL_BASED_OUTPATIENT_CLINIC_OR_DEPARTMENT_OTHER): Payer: Medicare HMO | Admitting: Anesthesiology

## 2016-09-22 ENCOUNTER — Ambulatory Visit (HOSPITAL_BASED_OUTPATIENT_CLINIC_OR_DEPARTMENT_OTHER)
Admission: RE | Admit: 2016-09-22 | Discharge: 2016-09-22 | Disposition: A | Discharge status: Home / Self Care | Payer: Medicare HMO | Source: Ambulatory Visit | Attending: Orthopedic Surgery | Admitting: Orthopedic Surgery

## 2016-09-22 DIAGNOSIS — G4730 Sleep apnea, unspecified: Secondary | ICD-10-CM | POA: Diagnosis not present

## 2016-09-22 DIAGNOSIS — R69 Illness, unspecified: Secondary | ICD-10-CM | POA: Diagnosis not present

## 2016-09-22 DIAGNOSIS — Z880 Allergy status to penicillin: Secondary | ICD-10-CM | POA: Diagnosis not present

## 2016-09-22 DIAGNOSIS — G8918 Other acute postprocedural pain: Secondary | ICD-10-CM | POA: Diagnosis not present

## 2016-09-22 DIAGNOSIS — Z79899 Other long term (current) drug therapy: Secondary | ICD-10-CM | POA: Diagnosis not present

## 2016-09-22 DIAGNOSIS — M1811 Unilateral primary osteoarthritis of first carpometacarpal joint, right hand: Secondary | ICD-10-CM | POA: Diagnosis not present

## 2016-09-22 DIAGNOSIS — J45909 Unspecified asthma, uncomplicated: Secondary | ICD-10-CM | POA: Insufficient documentation

## 2016-09-22 DIAGNOSIS — Z7982 Long term (current) use of aspirin: Secondary | ICD-10-CM | POA: Insufficient documentation

## 2016-09-22 DIAGNOSIS — E785 Hyperlipidemia, unspecified: Secondary | ICD-10-CM | POA: Insufficient documentation

## 2016-09-22 DIAGNOSIS — I10 Essential (primary) hypertension: Secondary | ICD-10-CM | POA: Insufficient documentation

## 2016-09-22 DIAGNOSIS — F419 Anxiety disorder, unspecified: Secondary | ICD-10-CM | POA: Insufficient documentation

## 2016-09-22 DIAGNOSIS — F319 Bipolar disorder, unspecified: Secondary | ICD-10-CM | POA: Insufficient documentation

## 2016-09-22 DIAGNOSIS — K219 Gastro-esophageal reflux disease without esophagitis: Secondary | ICD-10-CM | POA: Insufficient documentation

## 2016-09-22 HISTORY — PX: CARPOMETACARPEL SUSPENSION PLASTY: SHX5005

## 2016-09-22 SURGERY — CARPOMETACARPEL (CMC) SUSPENSION PLASTY
Anesthesia: General | Site: Hand | Laterality: Right

## 2016-09-22 MED ORDER — VANCOMYCIN HCL IN DEXTROSE 1-5 GM/200ML-% IV SOLN
1000.0000 mg | INTRAVENOUS | Status: AC
  Administered 2016-09-22: 1000 mg via INTRAVENOUS

## 2016-09-22 MED ORDER — DEXAMETHASONE SODIUM PHOSPHATE 10 MG/ML IJ SOLN
INTRAMUSCULAR | Status: DC | PRN
  Administered 2016-09-22: 10 mg via INTRAVENOUS

## 2016-09-22 MED ORDER — FENTANYL CITRATE (PF) 100 MCG/2ML IJ SOLN
INTRAMUSCULAR | Status: AC
  Filled 2016-09-22: qty 2

## 2016-09-22 MED ORDER — LACTATED RINGERS IV SOLN
INTRAVENOUS | Status: DC
  Administered 2016-09-22: 11:00:00 via INTRAVENOUS

## 2016-09-22 MED ORDER — FENTANYL CITRATE (PF) 100 MCG/2ML IJ SOLN: 25.0000 ug | INTRAMUSCULAR | Status: DC | PRN

## 2016-09-22 MED ORDER — MIDAZOLAM HCL 2 MG/2ML IJ SOLN
INTRAMUSCULAR | Status: AC
  Filled 2016-09-22: qty 2

## 2016-09-22 MED ORDER — PHENYLEPHRINE HCL 10 MG/ML IJ SOLN
INTRAVENOUS | Status: DC | PRN
  Administered 2016-09-22: 40 ug/min via INTRAVENOUS

## 2016-09-22 MED ORDER — CHLORHEXIDINE GLUCONATE 4 % EX LIQD: 60.0000 mL | Freq: Once | CUTANEOUS | Status: DC

## 2016-09-22 MED ORDER — SCOPOLAMINE 1 MG/3DAYS TD PT72: 1.0000 | MEDICATED_PATCH | Freq: Once | TRANSDERMAL | Status: DC | PRN

## 2016-09-22 MED ORDER — MIDAZOLAM HCL 2 MG/2ML IJ SOLN
1.0000 mg | INTRAMUSCULAR | Status: DC | PRN
  Administered 2016-09-22: 2 mg via INTRAVENOUS

## 2016-09-22 MED ORDER — PHENYLEPHRINE HCL 10 MG/ML IJ SOLN
INTRAMUSCULAR | Status: DC | PRN
  Administered 2016-09-22: 40 ug via INTRAVENOUS
  Administered 2016-09-22: 50 ug via INTRAVENOUS

## 2016-09-22 MED ORDER — PROPOFOL 10 MG/ML IV BOLUS
INTRAVENOUS | Status: DC | PRN
  Administered 2016-09-22: 300 mg via INTRAVENOUS

## 2016-09-22 MED ORDER — VANCOMYCIN HCL IN DEXTROSE 1-5 GM/200ML-% IV SOLN
INTRAVENOUS | Status: AC
  Filled 2016-09-22: qty 200

## 2016-09-22 MED ORDER — OXYCODONE-ACETAMINOPHEN 5-325 MG PO TABS: ORAL_TABLET | ORAL | 0 refills | Status: DC

## 2016-09-22 MED ORDER — FENTANYL CITRATE (PF) 100 MCG/2ML IJ SOLN
50.0000 ug | INTRAMUSCULAR | Status: AC | PRN
  Administered 2016-09-22: 100 ug via INTRAVENOUS
  Administered 2016-09-22 (×2): 50 ug via INTRAVENOUS

## 2016-09-22 SURGICAL SUPPLY — 68 items
BANDAGE ACE 3X5.8 VEL STRL LF (GAUZE/BANDAGES/DRESSINGS) IMPLANT
BLADE MINI RND TIP GREEN BEAV (BLADE) ×2 IMPLANT
BLADE SURG 15 STRL LF DISP TIS (BLADE) ×2 IMPLANT
BLADE SURG 15 STRL SS (BLADE) ×4
BNDG CMPR 9X4 STRL LF SNTH (GAUZE/BANDAGES/DRESSINGS) ×1
BNDG ELASTIC 2X5.8 VLCR STR LF (GAUZE/BANDAGES/DRESSINGS) IMPLANT
BNDG ESMARK 4X9 LF (GAUZE/BANDAGES/DRESSINGS) ×2 IMPLANT
BNDG GAUZE ELAST 4 BULKY (GAUZE/BANDAGES/DRESSINGS) ×2 IMPLANT
CHLORAPREP W/TINT 26ML (MISCELLANEOUS) ×2 IMPLANT
CORDS BIPOLAR (ELECTRODE) ×2 IMPLANT
COVER BACK TABLE 60X90IN (DRAPES) ×2 IMPLANT
COVER MAYO STAND STRL (DRAPES) ×2 IMPLANT
CUFF TOURNIQUET SINGLE 18IN (TOURNIQUET CUFF) ×2 IMPLANT
DECANTER SPIKE VIAL GLASS SM (MISCELLANEOUS) IMPLANT
DRAPE EXTREMITY T 121X128X90 (DRAPE) ×2 IMPLANT
DRAPE OEC MINIVIEW 54X84 (DRAPES) IMPLANT
DRAPE SURG 17X23 STRL (DRAPES) ×2 IMPLANT
DRSG PAD ABDOMINAL 8X10 ST (GAUZE/BANDAGES/DRESSINGS) IMPLANT
GAUZE SPONGE 4X4 12PLY STRL (GAUZE/BANDAGES/DRESSINGS) ×2 IMPLANT
GAUZE XEROFORM 1X8 LF (GAUZE/BANDAGES/DRESSINGS) ×2 IMPLANT
GLOVE BIO SURGEON STRL SZ7.5 (GLOVE) ×2 IMPLANT
GLOVE BIOGEL PI IND STRL 8 (GLOVE) ×1 IMPLANT
GLOVE BIOGEL PI IND STRL 8.5 (GLOVE) IMPLANT
GLOVE BIOGEL PI INDICATOR 8 (GLOVE) ×1
GLOVE BIOGEL PI INDICATOR 8.5 (GLOVE)
GLOVE SURG ORTHO 8.0 STRL STRW (GLOVE) IMPLANT
GOWN STRL REUS W/ TWL LRG LVL3 (GOWN DISPOSABLE) ×1 IMPLANT
GOWN STRL REUS W/TWL LRG LVL3 (GOWN DISPOSABLE) ×2
GOWN STRL REUS W/TWL XL LVL3 (GOWN DISPOSABLE) ×2 IMPLANT
K-WIRE .035X4 (WIRE) IMPLANT
NDL HYPO 25X1 1.5 SAFETY (NEEDLE) IMPLANT
NDL KEITH (NEEDLE) IMPLANT
NDL SAFETY ECLIPSE 18X1.5 (NEEDLE) IMPLANT
NEEDLE HYPO 18GX1.5 SHARP (NEEDLE)
NEEDLE HYPO 22GX1.5 SAFETY (NEEDLE) IMPLANT
NEEDLE HYPO 25X1 1.5 SAFETY (NEEDLE) IMPLANT
NEEDLE KEITH (NEEDLE) IMPLANT
NS IRRIG 1000ML POUR BTL (IV SOLUTION) ×2 IMPLANT
PACK BASIN DAY SURGERY FS (CUSTOM PROCEDURE TRAY) ×2 IMPLANT
PAD CAST 3X4 CTTN HI CHSV (CAST SUPPLIES) ×1 IMPLANT
PAD CAST 4YDX4 CTTN HI CHSV (CAST SUPPLIES) IMPLANT
PADDING CAST ABS 4INX4YD NS (CAST SUPPLIES) ×1
PADDING CAST ABS COTTON 4X4 ST (CAST SUPPLIES) ×1 IMPLANT
PADDING CAST COTTON 3X4 STRL (CAST SUPPLIES) ×2
PADDING CAST COTTON 4X4 STRL (CAST SUPPLIES)
PASSER SUT SWANSON 36MM LOOP (INSTRUMENTS) IMPLANT
SLEEVE SCD COMPRESS KNEE MED (MISCELLANEOUS) IMPLANT
SPLINT FAST PLASTER 5X30 (CAST SUPPLIES)
SPLINT PLASTER CAST FAST 5X30 (CAST SUPPLIES) IMPLANT
SPLINT PLASTER CAST XFAST 4X15 (CAST SUPPLIES) IMPLANT
SPLINT PLASTER XTRA FAST SET 4 (CAST SUPPLIES)
STOCKINETTE 4X48 STRL (DRAPES) ×2 IMPLANT
SUT ETHIBOND 3-0 V-5 (SUTURE) IMPLANT
SUT ETHILON 3 0 PS 1 (SUTURE) IMPLANT
SUT ETHILON 4 0 PS 2 18 (SUTURE) ×2 IMPLANT
SUT FIBERWIRE 2-0 18 17.9 3/8 (SUTURE) ×2
SUT MERSILENE 2.0 SH NDLE (SUTURE) IMPLANT
SUT MERSILENE 4 0 P 3 (SUTURE) IMPLANT
SUT SILK 4 0 PS 2 (SUTURE) IMPLANT
SUT STEEL 3 0 (SUTURE) IMPLANT
SUT VIC AB 0 SH 27 (SUTURE) IMPLANT
SUT VICRYL 4-0 PS2 18IN ABS (SUTURE) ×2 IMPLANT
SUTURE FIBERWR 2-0 18 17.9 3/8 (SUTURE) ×1 IMPLANT
SYR BULB 3OZ (MISCELLANEOUS) ×2 IMPLANT
SYR CONTROL 10ML LL (SYRINGE) IMPLANT
TOWEL OR 17X24 6PK STRL BLUE (TOWEL DISPOSABLE) ×4 IMPLANT
TOWEL OR NON WOVEN STRL DISP B (DISPOSABLE) ×2 IMPLANT
UNDERPAD 30X30 (UNDERPADS AND DIAPERS) ×2 IMPLANT

## 2016-09-22 NOTE — Anesthesia Preprocedure Evaluation (Signed)
Anesthesia Evaluation  Patient identified by MRN, date of birth, ID band Patient awake    Reviewed: Allergy & Precautions, NPO status   Airway Mallampati: II  TM Distance: >3 FB     Dental   Pulmonary asthma , sleep apnea ,    breath sounds clear to auscultation       Cardiovascular hypertension,  Rhythm:Regular Rate:Normal     Neuro/Psych    GI/Hepatic Neg liver ROS, GERD  ,  Endo/Other    Renal/GU Renal disease     Musculoskeletal  (+) Arthritis ,   Abdominal   Peds  Hematology   Anesthesia Other Findings   Reproductive/Obstetrics                             Anesthesia Physical Anesthesia Plan  ASA: III  Anesthesia Plan: General   Post-op Pain Management:  Regional for Post-op pain   Induction: Intravenous  Airway Management Planned: LMA  Additional Equipment:   Intra-op Plan:   Post-operative Plan:   Informed Consent: I have reviewed the patients History and Physical, chart, labs and discussed the procedure including the risks, benefits and alternatives for the proposed anesthesia with the patient or authorized representative who has indicated his/her understanding and acceptance.   Dental advisory given  Plan Discussed with: CRNA and Anesthesiologist  Anesthesia Plan Comments:         Anesthesia Quick Evaluation

## 2016-09-22 NOTE — Discharge Instructions (Addendum)

## 2016-09-22 NOTE — Progress Notes (Signed)
[facility] CRNA Assisted Dr. Oletta Lamas with right, ultrasound guided, supraclavicular block. Side rails up, monitors on throughout procedure. See vital signs in flow sheet. Tolerated Procedure well. All Meds gven by CRNA and in Anesthesia flowsheet.

## 2016-09-22 NOTE — Anesthesia Postprocedure Evaluation (Signed)
Anesthesia Post Note  Patient: Sean Hill  Procedure(s) Performed: Procedure(s) (LRB): right thumb carpometacarpal  ARTHROPLASTY (Right)  Patient location during evaluation: PACU Anesthesia Type: General and Regional Level of consciousness: awake and alert Pain management: pain level controlled Vital Signs Assessment: post-procedure vital signs reviewed and stable Respiratory status: spontaneous breathing, nonlabored ventilation, respiratory function stable and patient connected to nasal cannula oxygen Cardiovascular status: blood pressure returned to baseline and stable Postop Assessment: no signs of nausea or vomiting Anesthetic complications: no       Last Vitals:  Vitals:   09/22/16 1430 09/22/16 1445  BP: (!) 100/53 (!) 110/48  Pulse:  99  Resp:  14  Temp:      Last Pain:  Vitals:   09/22/16 1415  TempSrc:   PainSc: 2                  Robbi Spells,W. EDMOND

## 2016-09-22 NOTE — H&P (Signed)
Sean Hill is an 76 y.o. male.   Chief Complaint: right thumb cmc arthritis HPI: 76 yo male with pain in right wrist and cmc joint of thumb.  He has tried splinting and injections without lasting relief.  He wishes to undergo trapeziectomy with ligament reconstruction for management of symptoms.  Allergies:  Allergies  Allergen Reactions  . Codeine Shortness Of Breath  . Penicillins Hives, Rash and Other (See Comments)    Watery blisters Has patient had a PCN reaction causing immediate rash, facial/tongue/throat swelling, SOB or lightheadedness with hypotension: No Has patient had a PCN reaction causing severe rash involving mucus membranes or skin necrosis: No Has patient had a PCN reaction that required hospitalization No Has patient had a PCN reaction occurring within the last 10 years: No If all of the above answers are "NO", then may proceed with Cephalosporin use.     Past Medical History:  Diagnosis Date  . Anxiety   . Asthma    as child, no problems now  . Bipolar disorder [facility])    Has psychiatrist.  BH admission (suicidal) 07/20/11.  Marland Kitchen Cervical spondylosis    Primarily C4-5 and C5-6--referred to [facility] 02/2011.  . Depression   . GERD (gastroesophageal reflux disease)   . Hip pain, left 2011-2012   Left hip and groin: MRI pelvis and left hip 02/18/11 showed NORMAL hip, with small inguinal hernia containing fat and sigmoid diverticulosis.  Hip pain presumably referred pain from hernia/diverticular dz??.  . Hyperlipemia    Intol of meds: GI side effects  . Hypertension     no meds now  . Inguinal hernia 02/25/11   Left sided hernia with fat  . Left bundle branch block (LBBB) on electrocardiogram 10/29/2015   Rate related bundle branch block. Only noted once in February 2017.  Marland Kitchen Loss of hearing   . MRSA carrier    Intranasal bactroban tx 2011.  No hx of MRSA infection.  . Nephrolithiasis   . Overdose of benzodiazepine 04/30/2013   Status post  . Sleep apnea    states he has never had sleep study and only wife states he has OSA  . 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 ANKLE FRACTURE SURGERY     hardware still in both ankles (titanium)  . ANKLE SURGERY     Ankle tendon surgery  . CARDIAC CATHETERIZATION  1983   Angiographically normal coronary arteries. LVH noted.  . ELBOW SURGERY     left---tendon  . INGUINAL HERNIA REPAIR     Right side with mesh about 1993  . OPEN REDUCTION INTERNAL FIXATION (ORIF) DISTAL RADIAL FRACTURE Right 10/29/2015   Procedure: OPEN REDUCTION INTERNAL FIXATION (ORIF) DISTAL RADIAL FRACTURE;  Surgeon: Leanora Cover, MD;  Location: [place];  Service: Orthopedics;  Laterality: Right;  . TONSILLECTOMY    . TOTAL KNEE ARTHROPLASTY     Right and left (titanium)  . TRANSTHORACIC ECHOCARDIOGRAM  06/24/2016   Normal LV function EF 60-65%. Grade 1 diastolic dysfunction. Otherwise essentially normal.  . ULNAR COLLATERAL LIGAMENT REPAIR Right 12/29/2015   Procedure: REPAIR  RIGHT LATERAL ULNAR COLLATERAL LIGAMENT TEAR  EXTENSOR ORIGIN ;  Surgeon: Leanora Cover, MD;  Location: [place];  Service: Orthopedics;  Laterality: Right;  . WRIST ARTHROSCOPY WITH DEBRIDEMENT Right 07/14/2016   Procedure: RIGHT WRIST ARTHROSCOPY WITH DEBRIDEMENT  TRIANGULAR FIBROCARTILAGE COMPLEX;  Surgeon: Leanora Cover, MD;  Location: [place];  Service: Orthopedics;  Laterality: Right;    Family History: Family History  Problem Relation Age of Onset  . Alcohol abuse Mother   . Ovarian cancer Mother   . Alcohol abuse Father   . Pancreatic cancer Father   . Depression Daughter   . Paranoid behavior Daughter   . Hyperlipidemia Brother     Social History:   reports that he has never smoked. He has never used smokeless tobacco. He reports that he drinks alcohol. He reports that  he does not use drugs.  Medications: Facility-Administered Medications Prior to Admission  Medication Dose Route Frequency Provider Last Rate Last Dose  . ARIPiprazole ER PRSY 400 mg  400 mg Intramuscular Q28 days [name], MD   400 mg at 08/25/16 0919   Medications Prior to Admission  Medication Sig Dispense Refill  . ARIPiprazole ER (ABILIFY MAINTENA) 400 MG SUSR Inject 400 mg into the muscle every 30 (thirty) days. 1 each 0  . aspirin 81 MG tablet Take 81 mg by mouth daily.    Marland Kitchen atorvastatin (LIPITOR) 40 MG tablet Take 1 tablet (40 mg total) by mouth daily. 90 tablet 3  . benztropine (COGENTIN) 1 MG tablet Take 1 tablet (1 mg total) by mouth daily. 30 tablet 2  . escitalopram (LEXAPRO) 20 MG tablet Take 1 tablet (20 mg total) by mouth daily. 30 tablet 2  . fenofibrate (TRICOR) 145 MG tablet Take 1 tablet (145 mg total) by mouth daily. 90 tablet 3  . Flaxseed, Linseed, (FLAXSEED OIL PO) Take 1 tablet by mouth daily.    Marland Kitchen lisinopril (PRINIVIL,ZESTRIL) 20 MG tablet Take 1 tablet (20 mg total) by mouth daily. 30 tablet 1  . Multiple Vitamin (MULTIVITAMIN WITH MINERALS) TABS tablet Take 1 tablet by mouth daily.    Marland Kitchen omeprazole (PRILOSEC) 40 MG capsule TAKE ONE CAPSULE BY MOUTH TWICE DAILY 60 capsule 5  . oxymetazoline (AFRIN) 0.05 % nasal spray Place 1 spray into both nostrils 2 (two) times daily as needed for congestion.     . temazepam (RESTORIL) 15 MG capsule Take 1 capsule (15 mg total) by mouth at bedtime as needed. for sleep 30 capsule 2  . traZODone (DESYREL) 100 MG tablet Take 1 tablet (100 mg total) by mouth at bedtime. 30 tablet 2  . Vitamin D, Cholecalciferol, 1000 UNITS TABS Take 1 tablet by mouth daily.      No results found for this or any previous visit (from the past 48 hour(s)).  No results found.   A comprehensive review of systems was negative.  Blood pressure (!) 120/52, pulse 66, temperature 98.1 F (36.7 C), temperature source Oral, resp. rate 18, height 5'  10" (1.778 m), weight 116.1 kg (256 lb), SpO2 96 %.  General appearance: alert, cooperative and appears stated age Head: Normocephalic, without obvious abnormality, atraumatic Neck: supple, symmetrical, trachea midline Resp: clear to auscultation bilaterally Cardio: regular rate and rhythm GI: non-tender Extremities: Intact sensation and capillary refill all digits.  +epl/fpl/io.  No wounds.  Pulses: 2+ and symmetric Skin: Skin color, texture, turgor normal. No rashes or lesions Neurologic: Grossly normal Incision/Wound:none  Assessment/Plan Right thumb cmc OA.  Non operative and operative treatment options were discussed with the patient and patient wishes to proceed with operative treatment. Risks, benefits, and alternatives of surgery were discussed and the patient agrees with the plan of care.   Rilee Wendling R 09/22/2016, 11:13 AM

## 2016-09-22 NOTE — Anesthesia Procedure Notes (Signed)
Anesthesia Regional Block:  Supraclavicular block  Pre-Anesthetic Checklist: ,, timeout performed, Correct Patient, Correct Site, Correct Laterality, Correct Procedure, Correct Position, site marked, Risks and benefits discussed,  Surgical consent,  Pre-op evaluation,  At surgeon's request and post-op pain management  Laterality: Right  Prep: chloraprep       Needles:   Needle Type: Stimulator Needle - 40          Additional Needles:  Procedures: Doppler guided, ultrasound guided (picture in chart) and nerve stimulator Supraclavicular block Narrative:  Start time: 09/22/2016 11:18 AM End time: 09/22/2016 11:45 AM Injection made incrementally with aspirations every 5 mL.  Performed by: Personally  Anesthesiologist: Belinda Block

## 2016-09-22 NOTE — Op Note (Signed)
I assisted Surgeon(s) and Role:    * Leanora Cover, MD - Primary    * Daryll Brod, MD - Assisting on the Procedure(s): right thumb carpometacarpal  ARTHROPLASTY on 09/22/2016.  I provided assistance on this case as follows: approach, dissection, bone removal, tendon harvest, tendon transfer, reconstruction,closure and application of dressing. I was present for the entire case.  Electronically signed by: Wynonia Sours, MD Date: 09/22/2016 Time: 2:13 PM

## 2016-09-22 NOTE — Anesthesia Procedure Notes (Signed)
Procedure Name: LMA Insertion Date/Time: 09/22/2016 12:09 PM Performed by: Melynda Ripple D Pre-anesthesia Checklist: Patient identified, Emergency Drugs available, Suction available and Patient being monitored Patient Re-evaluated:Patient Re-evaluated prior to inductionOxygen Delivery Method: Circle system utilized Preoxygenation: Pre-oxygenation with 100% oxygen Intubation Type: IV induction Ventilation: Mask ventilation without difficulty LMA: LMA inserted LMA Size: 5.0 Number of attempts: 1 Airway Equipment and Method: Bite block Placement Confirmation: positive ETCO2 Tube secured with: Tape Dental Injury: Teeth and Oropharynx as per pre-operative assessment

## 2016-09-22 NOTE — Transfer of Care (Signed)
Immediate Anesthesia Transfer of Care Note  Patient: Sean Hill  Procedure(s) Performed: Procedure(s) with comments: right thumb carpometacarpal  ARTHROPLASTY (Right) - right thumb carpometacarpal  ARTHROPLASTY  Patient Location: PACU  Anesthesia Type:General  Level of Consciousness: awake, alert  and oriented  Airway & Oxygen Therapy: Patient Spontanous Breathing and Patient connected to face mask oxygen  Post-op Assessment: Report given to RN and Post -op Vital signs reviewed and stable  Post vital signs: Reviewed and stable  Last Vitals:  Vitals:   09/22/16 1150 09/22/16 1346  BP: (!) 126/58 120/64  Pulse: 60 (!) 111  Resp: 12 16  Temp:      Last Pain:  Vitals:   09/22/16 1106  TempSrc: Oral  PainSc: 5       Patients Stated Pain Goal: 3 ([id number])  Complications: No apparent anesthesia complications

## 2016-09-22 NOTE — Brief Op Note (Signed)
09/22/2016  1:40 PM  PATIENT:  Sean Hill  76 y.o. male  PRE-OPERATIVE DIAGNOSIS:  right thumb carpometacarpal osteoarthritis  POST-OPERATIVE DIAGNOSIS:  right thumb carpometacarpal osteoarthritis  PROCEDURE:  Procedure(s) with comments: right thumb carpometacarpal  ARTHROPLASTY (Right) - right thumb carpometacarpal  ARTHROPLASTY  SURGEON:  Surgeon(s) and Role:    * Leanora Cover, MD - Primary    * Daryll Brod, MD - Assisting  PHYSICIAN ASSISTANT:   ASSISTANTS: Daryll Brod, MD   ANESTHESIA:   regional and general  EBL:  Total I/O In: 1000 [I.V.:1000] Out: 5 [Blood:5]  BLOOD ADMINISTERED:none  DRAINS: none   LOCAL MEDICATIONS USED:  NONE  SPECIMEN:  No Specimen  DISPOSITION OF SPECIMEN:  N/A  COUNTS:  YES  TOURNIQUET:   Total Tourniquet Time Documented: Upper Arm (Right) - 72 minutes Total: Upper Arm (Right) - 72 minutes   DICTATION: .Other Dictation: Dictation Number [id number]  PLAN OF CARE: Discharge to home after PACU  PATIENT DISPOSITION:  PACU - hemodynamically stable.

## 2016-09-23 ENCOUNTER — Encounter (HOSPITAL_BASED_OUTPATIENT_CLINIC_OR_DEPARTMENT_OTHER): Payer: Self-pay | Admitting: Orthopedic Surgery

## 2016-09-23 ENCOUNTER — Other Ambulatory Visit: Payer: Self-pay | Admitting: Adult Health

## 2016-09-23 ENCOUNTER — Ambulatory Visit (INDEPENDENT_AMBULATORY_CARE_PROVIDER_SITE_OTHER): Payer: Medicare HMO | Admitting: Adult Health

## 2016-09-23 ENCOUNTER — Telehealth: Payer: Self-pay | Admitting: Adult Health

## 2016-09-23 VITALS — BP 130/76 | Temp 97.6°F | Ht 70.0 in | Wt 260.6 lb

## 2016-09-23 DIAGNOSIS — I10 Essential (primary) hypertension: Secondary | ICD-10-CM

## 2016-09-23 MED ORDER — ONDANSETRON HCL 4 MG PO TABS: 4.0000 mg | ORAL_TABLET | Freq: Three times a day (TID) | ORAL | 0 refills | Status: DC | PRN

## 2016-09-23 NOTE — Telephone Encounter (Signed)
See below

## 2016-09-23 NOTE — Telephone Encounter (Signed)
Pt has changed his mind about Rx for nausea. Wife states pt threw up all the way home. [facility] [address]

## 2016-09-23 NOTE — Addendum Note (Signed)
Addendum  created 09/23/16 1544 by Terrance Mass, CRNA   Charge Capture section accepted

## 2016-09-23 NOTE — Progress Notes (Signed)
Subjective:    Patient ID: Sean Hill, male    DOB: 1941/04/04, 76 y.o.   MRN: [id number]  HPI  76 year old male who presents to the office today for one month follow up regarding hypertension. During his last visit we had started him on Lisinopril 20 mg daily.   Today in the office he reports that he had surgery on his right hand yesterday. At that appointment his blood pressure was 120/61. He has not been checking his blood pressure at home. He has start exercising and dieting, he has cut out sugars and carbs. His weight is down about 5 pounds.   He denies any chest pain, SOB, or dizziness or lightheadedness.   BP Readings from Last 3 Encounters:  09/23/16 130/76  09/22/16 121/63  08/23/16 (!) 164/70    Review of Systems  Constitutional: Negative.   Respiratory: Negative.   Cardiovascular: Negative.   Neurological: Negative.   All other systems reviewed and are negative.  Past Medical History:  Diagnosis Date  . Anxiety   . Asthma    as child, no problems now  . Bipolar disorder [facility])    Has psychiatrist.  BH admission (suicidal) 07/20/11.  Marland Kitchen Cervical spondylosis    Primarily C4-5 and C5-6--referred to [facility] 02/2011.  . Depression   . GERD (gastroesophageal reflux disease)   . Hip pain, left 2011-2012   Left hip and groin: MRI pelvis and left hip 02/18/11 showed NORMAL hip, with small inguinal hernia containing fat and sigmoid diverticulosis.  Hip pain presumably referred pain from hernia/diverticular dz??.  . Hyperlipemia    Intol of meds: GI side effects  . Hypertension     no meds now  . Inguinal hernia 02/25/11   Left sided hernia with fat  . Left bundle branch block (LBBB) on electrocardiogram 10/29/2015   Rate related bundle branch block. Only noted once in February 2017.  Marland Kitchen Loss of hearing   . MRSA carrier    Intranasal bactroban tx 2011.  No hx of MRSA infection.  . Nephrolithiasis   . Overdose of benzodiazepine  04/30/2013   Status post  . Sleep apnea    states he has never had sleep study and only wife states he has OSA  . 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 Marital status: Married    Spouse name: N/A  . Number of children: N/A  . Years of education: N/A   Occupational History  . Not on file.   Social History Main Topics  . Smoking status: Never Smoker  . Smokeless tobacco: Never Used  . Alcohol use 0.0 oz/week     Comment: One drink rarely  . Drug use: No  . Sexual activity: Not Currently    Birth control/ protection: None     Comment: Same partner for 45 years   Other Topics Concern  . Not on file   Social History Narrative   Married, lives in [place].  Retired Dance movement psychotherapist.   Two daughters, both married,3 grandchildren ( 58, 60, 1 year)    No regular exercise.  Never smoker.  No ETOH.  No drugs.    Past Surgical History:  Procedure Laterality Date  . ANKLE FRACTURE SURGERY     hardware still in both ankles (titanium)  . ANKLE SURGERY     Ankle tendon surgery  . [place]  Angiographically normal coronary arteries. LVH noted.  Marland Kitchen CARPOMETACARPEL SUSPENSION PLASTY Right 09/22/2016   Procedure: right thumb carpometacarpal  ARTHROPLASTY;  Surgeon: Leanora Cover, MD;  Location: [place];  Service: Orthopedics;  Laterality: Right;  right thumb carpometacarpal  ARTHROPLASTY  . ELBOW SURGERY     left---tendon  . INGUINAL HERNIA REPAIR     Right side with mesh about 1993  . OPEN REDUCTION INTERNAL FIXATION (ORIF) DISTAL RADIAL FRACTURE Right 10/29/2015   Procedure: OPEN REDUCTION INTERNAL FIXATION (ORIF) DISTAL RADIAL FRACTURE;  Surgeon: Leanora Cover, MD;  Location: [place];  Service: Orthopedics;  Laterality: Right;  . TONSILLECTOMY    . TOTAL KNEE ARTHROPLASTY     Right and left (titanium)  . TRANSTHORACIC ECHOCARDIOGRAM  06/24/2016   Normal  LV function EF 60-65%. Grade 1 diastolic dysfunction. Otherwise essentially normal.  . ULNAR COLLATERAL LIGAMENT REPAIR Right 12/29/2015   Procedure: REPAIR  RIGHT LATERAL ULNAR COLLATERAL LIGAMENT TEAR  EXTENSOR ORIGIN ;  Surgeon: Leanora Cover, MD;  Location: [place];  Service: Orthopedics;  Laterality: Right;  . WRIST ARTHROSCOPY WITH DEBRIDEMENT Right 07/14/2016   Procedure: RIGHT WRIST ARTHROSCOPY WITH DEBRIDEMENT  TRIANGULAR FIBROCARTILAGE COMPLEX;  Surgeon: Leanora Cover, MD;  Location: [place];  Service: Orthopedics;  Laterality: Right;    Family History  Problem Relation Age of Onset  . Alcohol abuse Mother   . Ovarian cancer Mother   . Alcohol abuse Father   . Pancreatic cancer Father   . Depression Daughter   . Paranoid behavior Daughter   . Hyperlipidemia Brother     Allergies  Allergen Reactions  . Codeine Shortness Of Breath  . Penicillins Hives, Rash and Other (See Comments)    Watery blisters Has patient had a PCN reaction causing immediate rash, facial/tongue/throat swelling, SOB or lightheadedness with hypotension: No Has patient had a PCN reaction causing severe rash involving mucus membranes or skin necrosis: No Has patient had a PCN reaction that required hospitalization No Has patient had a PCN reaction occurring within the last 10 years: No If all of the above answers are "NO", then may proceed with Cephalosporin use.     Current Outpatient Prescriptions on File Prior to Visit  Medication Sig Dispense Refill  . ARIPiprazole ER (ABILIFY MAINTENA) 400 MG SUSR Inject 400 mg into the muscle every 30 (thirty) days. 1 each 0  . aspirin 81 MG tablet Take 81 mg by mouth daily.    Marland Kitchen atorvastatin (LIPITOR) 40 MG tablet Take 1 tablet (40 mg total) by mouth daily. 90 tablet 3  . benztropine (COGENTIN) 1 MG tablet Take 1 tablet (1 mg total) by mouth daily. 30 tablet 2  . escitalopram (LEXAPRO) 20 MG tablet Take 1 tablet (20 mg total) by  mouth daily. 30 tablet 2  . fenofibrate (TRICOR) 145 MG tablet Take 1 tablet (145 mg total) by mouth daily. 90 tablet 3  . Flaxseed, Linseed, (FLAXSEED OIL PO) Take 1 tablet by mouth daily.    Marland Kitchen lisinopril (PRINIVIL,ZESTRIL) 20 MG tablet Take 1 tablet (20 mg total) by mouth daily. 30 tablet 1  . Multiple Vitamin (MULTIVITAMIN WITH MINERALS) TABS tablet Take 1 tablet by mouth daily.    Marland Kitchen omeprazole (PRILOSEC) 40 MG capsule TAKE ONE CAPSULE BY MOUTH TWICE DAILY 60 capsule 5  . oxyCODONE-acetaminophen (PERCOCET) 5-325 MG tablet 1-2 tabs PO q6 hours prn pain 30 tablet 0  . oxymetazoline (AFRIN) 0.05 % nasal spray Place 1 spray into both  nostrils 2 (two) times daily as needed for congestion.     . temazepam (RESTORIL) 15 MG capsule Take 1 capsule (15 mg total) by mouth at bedtime as needed. for sleep 30 capsule 2  . traZODone (DESYREL) 100 MG tablet Take 1 tablet (100 mg total) by mouth at bedtime. 30 tablet 2  . Vitamin D, Cholecalciferol, 1000 UNITS TABS Take 1 tablet by mouth daily.     Current Facility-Administered Medications on File Prior to Visit  Medication Dose Route Frequency Provider Last Rate Last Dose  . ARIPiprazole ER PRSY 400 mg  400 mg Intramuscular Q28 days [name], MD   400 mg at 08/25/16 0919    BP 130/76   Temp 97.6 F (36.4 C) (Oral)   Ht 5\' 10"  (1.778 m)   Wt 260 lb 9.6 oz (118.2 kg)   BMI 37.39 kg/m       Objective:   Physical Exam  Constitutional: He is oriented to person, place, and time. He appears well-developed and well-nourished. No distress.  Cardiovascular: Normal rate, regular rhythm, normal heart sounds and intact distal pulses.  Exam reveals no gallop and no friction rub.   No murmur heard. Pulmonary/Chest: Effort normal and breath sounds normal. No respiratory distress. He has no wheezes. He has no rales. He exhibits no tenderness.  Neurological: He is alert and oriented to person, place, and time.  Skin: Skin is warm and dry. No rash noted. He  is not diaphoretic. No erythema. No pallor.  Psychiatric: He has a normal mood and affect. His behavior is normal. Judgment and thought content normal.  Nursing note and vitals reviewed.     Assessment & Plan:  1. Essential hypertension - Continue with current dose of lisinopril  - Continue to work on diet and exercise - he is making improvement  - Monitor BP at home.  - Follow up as needed  [name], NP

## 2016-09-23 NOTE — Progress Notes (Signed)
Pre visit review using our clinic review tool, if applicable. No additional management support is needed unless otherwise documented below in the visit note. 

## 2016-09-23 NOTE — Telephone Encounter (Signed)
Patient's wife notified and verbalized understanding.  

## 2016-09-23 NOTE — Telephone Encounter (Signed)
Zofran sent in

## 2016-09-23 NOTE — Op Note (Signed)
NAMEKAIKANE, BENGEL            ACCOUNT NO.:  [id number]  MEDICAL RECORD NO.:  [id number]  LOCATION:                                 FACILITY:  PHYSICIAN:  Leanora Cover, MD        DATE OF BIRTH:  May 16, 1941  DATE OF PROCEDURE:  09/22/2016 DATE OF DISCHARGE:                              OPERATIVE REPORT   PREOPERATIVE DIAGNOSIS:  Right thumb carpometacarpal osteoarthritis.  POSTOPERATIVE DIAGNOSIS:  Right thumb carpometacarpal osteoarthritis.  PROCEDURE:  Right thumb carpometacarpal arthroplasty with abductor pollicis longus tendon transfer.  SURGEON:  Leanora Cover, MD.  ASSISTANT:  Daryll Brod, MD.  ANESTHESIA:  General with regional.  IV FLUIDS:  Per anesthesia flow sheet.  ESTIMATED BLOOD LOSS:  Minimal.  COMPLICATIONS:  None.  SPECIMENS:  None.  TOURNIQUET TIME:  72 minutes.  DISPOSITION:  Stable to PACU.  INDICATIONS:  Sean Hill is a 76 year old male who has had pain at the base of his right thumb.  He has tried splinting and injections without lasting relief.  He wishes to have a [facility] arthroplasty for management of the symptoms.  Risks, benefits, and alternative of surgery were discussed including the risk of blood loss; infection; damage to nerves, vessels, tendons, ligaments, bone; failure of surgery; need for additional surgery; complications with wound healing; continued pain. He voiced understanding of these risks and elected to proceed.  OPERATIVE COURSE:  After being identified preoperatively by myself, the patient and I agreed upon procedure and site of procedure.  Surgical site was marked.  The risks, benefits, and alternatives of surgery were reviewed and he wished to proceed.  Surgical consent had been signed. He was given IV Ancef as preoperative antibiotic prophylaxis.  He was transported to the operating room and placed on the operating room table in supine position with the right upper extremity on arm board.  General anesthesia induced by  anesthesiologist.  A regional block had been performed by Anesthesia in the preoperative holding.  Right upper extremity was prepped and draped in normal sterile orthopedic fashion. A surgical pause was performed between surgeons, anesthesia, and operating room staff and all were in agreement as to the patient, procedure, and site of procedure.  Tourniquet at the proximal aspect of the extremity was inflated to 250 mmHg after exsanguination of the limb with an Esmarch bandage.  Incision was made at the thumb at the hairy- glabrous border and carried into subcutaneous tissues by spreading technique.  The thumb CMC joint was identified.  The radial artery was identified and protected throughout the case.  The STT joint was identified.  The trapezium was localized and confirmed with a radiograph.  The trapezium was then freed up of soft tissue attachments and removed using a combination of Beaver blade, curette and rongeur. The APL tendon was then identified.  A slip of APL was selected.  An incision was made more proximally on the forearm to perform a tendon harvest.  A wire was used to separate the tendon which was then transected proximally and brought back into the distal wound.  A drill hole was made in the thumb metacarpal from the dorsal aspect across to the articular metaphyseal junction.  An  additional hole was made in the index finger metacarpal dorsally.  The EPL tendon was then able to be passed through the drill hole in the thumb and through the hole in the index finger metacarpal and then brought back right over top of the index finger metacarpal.  This was sewn to itself using 2-0 FiberWire suture.  It was then brought back under the thumb metacarpal as a sling and sutured to itself again.  This provided good suspension of the thumb metacarpal.  The wounds were all copiously irrigated with sterile saline.  The capsule of the [facility] joint was closed with a 4-0 Vicryl suture.  The  skin was closed with 4-0 nylon in a horizontal mattress fashion.  The wounds were all dressed with sterile Xeroform, 4x4s, and wrapped with a Kerlix bandage.  A thumb spica splint was placed and wrapped with Kerlix and Ace bandage.  Tourniquet was deflated at 72 minutes.  Fingertips were pink with brisk capillary refill after deflation of the tourniquet.  The operative drapes were broken down, and the patient was awakened from anesthesia safely.  He was transferred back to a stretcher and taken to PACU in stable condition.  I will see him back in the office in 1 week for postoperative followup.  I will give him Percocet 5/325 one to two p.o. q.6 hours p.r.n. pain dispensed #30.     Leanora Cover, MD     KK/MEDQ  D:  09/22/2016  T:  09/23/2016  Job:  [id number]

## 2016-09-26 ENCOUNTER — Encounter (HOSPITAL_COMMUNITY): Payer: Self-pay

## 2016-09-26 ENCOUNTER — Ambulatory Visit (INDEPENDENT_AMBULATORY_CARE_PROVIDER_SITE_OTHER): Payer: Medicare HMO

## 2016-09-26 DIAGNOSIS — F3131 Bipolar disorder, current episode depressed, mild: Secondary | ICD-10-CM

## 2016-09-26 DIAGNOSIS — F3130 Bipolar disorder, current episode depressed, mild or moderate severity, unspecified: Secondary | ICD-10-CM

## 2016-09-26 DIAGNOSIS — R69 Illness, unspecified: Secondary | ICD-10-CM | POA: Diagnosis not present

## 2016-09-26 NOTE — Progress Notes (Signed)
Patient presented for injection of Abilify Maintena 400 mg with appropriate affect, level mood and denied any current AH/VHDenies any racing thoughts or problems with mood.Patient reported no current problems with medications and no other concerns. Patient reports he had a nice quiet holiday. Abilify Maintena 400 mg was prepared as orderedand administered as ordered IMin patient's left upper outergluteal quadrant. Patient tolerated injection without complaint of pain or discomfort and patient to return in 30 days for next due injection. Patient agreed to come back in 30 days and to call with any problems or questions

## 2016-09-27 DIAGNOSIS — F411 Generalized anxiety disorder: Secondary | ICD-10-CM | POA: Diagnosis not present

## 2016-09-27 DIAGNOSIS — R69 Illness, unspecified: Secondary | ICD-10-CM | POA: Diagnosis not present

## 2016-09-30 DIAGNOSIS — M1811 Unilateral primary osteoarthritis of first carpometacarpal joint, right hand: Secondary | ICD-10-CM | POA: Diagnosis not present

## 2016-09-30 DIAGNOSIS — M79644 Pain in right finger(s): Secondary | ICD-10-CM | POA: Diagnosis not present

## 2016-10-12 ENCOUNTER — Other Ambulatory Visit: Payer: Self-pay | Admitting: Adult Health

## 2016-10-12 DIAGNOSIS — I10 Essential (primary) hypertension: Secondary | ICD-10-CM

## 2016-10-12 NOTE — Telephone Encounter (Signed)
Ok to refill for one year  

## 2016-10-24 ENCOUNTER — Ambulatory Visit (INDEPENDENT_AMBULATORY_CARE_PROVIDER_SITE_OTHER): Payer: Medicare HMO

## 2016-10-24 DIAGNOSIS — R69 Illness, unspecified: Secondary | ICD-10-CM | POA: Diagnosis not present

## 2016-10-24 DIAGNOSIS — F3111 Bipolar disorder, current episode manic without psychotic features, mild: Secondary | ICD-10-CM | POA: Diagnosis not present

## 2016-10-26 DIAGNOSIS — M1811 Unilateral primary osteoarthritis of first carpometacarpal joint, right hand: Secondary | ICD-10-CM | POA: Diagnosis not present

## 2016-10-28 DIAGNOSIS — F411 Generalized anxiety disorder: Secondary | ICD-10-CM | POA: Diagnosis not present

## 2016-10-28 DIAGNOSIS — R69 Illness, unspecified: Secondary | ICD-10-CM | POA: Diagnosis not present

## 2016-11-02 DIAGNOSIS — M79644 Pain in right finger(s): Secondary | ICD-10-CM | POA: Diagnosis not present

## 2016-11-10 ENCOUNTER — Encounter (HOSPITAL_COMMUNITY): Payer: Self-pay | Admitting: Psychiatry

## 2016-11-10 ENCOUNTER — Ambulatory Visit (INDEPENDENT_AMBULATORY_CARE_PROVIDER_SITE_OTHER): Payer: Medicare HMO | Admitting: Psychiatry

## 2016-11-10 DIAGNOSIS — Z8349 Family history of other endocrine, nutritional and metabolic diseases: Secondary | ICD-10-CM | POA: Diagnosis not present

## 2016-11-10 DIAGNOSIS — Z8041 Family history of malignant neoplasm of ovary: Secondary | ICD-10-CM | POA: Diagnosis not present

## 2016-11-10 DIAGNOSIS — Z79891 Long term (current) use of opiate analgesic: Secondary | ICD-10-CM

## 2016-11-10 DIAGNOSIS — Z811 Family history of alcohol abuse and dependence: Secondary | ICD-10-CM

## 2016-11-10 DIAGNOSIS — Z79899 Other long term (current) drug therapy: Secondary | ICD-10-CM

## 2016-11-10 DIAGNOSIS — F319 Bipolar disorder, unspecified: Secondary | ICD-10-CM

## 2016-11-10 DIAGNOSIS — Z818 Family history of other mental and behavioral disorders: Secondary | ICD-10-CM | POA: Diagnosis not present

## 2016-11-10 DIAGNOSIS — R69 Illness, unspecified: Secondary | ICD-10-CM | POA: Diagnosis not present

## 2016-11-10 DIAGNOSIS — Z800 Family history of malignant neoplasm of digestive organs: Secondary | ICD-10-CM | POA: Diagnosis not present

## 2016-11-10 DIAGNOSIS — Z7982 Long term (current) use of aspirin: Secondary | ICD-10-CM

## 2016-11-10 DIAGNOSIS — Z9889 Other specified postprocedural states: Secondary | ICD-10-CM

## 2016-11-10 DIAGNOSIS — Z888 Allergy status to other drugs, medicaments and biological substances status: Secondary | ICD-10-CM

## 2016-11-10 DIAGNOSIS — Z880 Allergy status to penicillin: Secondary | ICD-10-CM | POA: Diagnosis not present

## 2016-11-10 MED ORDER — ESCITALOPRAM OXALATE 20 MG PO TABS
20.0000 mg | ORAL_TABLET | Freq: Every day | ORAL | 2 refills | Status: DC
Start: 2016-11-10 — End: 2017-03-23

## 2016-11-10 MED ORDER — TEMAZEPAM 15 MG PO CAPS: 15.0000 mg | ORAL_CAPSULE | Freq: Every evening | ORAL | 2 refills | Status: DC | PRN

## 2016-11-10 MED ORDER — TRAZODONE HCL 100 MG PO TABS: 100.0000 mg | ORAL_TABLET | Freq: Every day | ORAL | 2 refills | Status: DC

## 2016-11-10 MED ORDER — BENZTROPINE MESYLATE 1 MG PO TABS
1.0000 mg | ORAL_TABLET | Freq: Every day | ORAL | 2 refills | Status: DC
Start: 2016-11-10 — End: 2017-02-07

## 2016-11-10 NOTE — Progress Notes (Signed)
BH MD/PA/NP OP Progress Note  11/10/2016 4:28 PM Sean Hill  MRN:  [id number]  Chief Complaint:  Subjective:  I'm doing good.  I'm sleeping better.  HPI: Sean Hill came for his follow-up appointment.  He is complaining of right elbow pain.  He has multiple surgeries and he is still wearing cast.  He is doing physical therapy and notice improvement in his pain.  He sleeping better.  He denies any paranoia, hallucination, irritability or any anger.  He continues to get Abilify injection every 4 weeks.  He admitted that his mood has been stable the best in recent years.  He denies any crying spells or any feeling of hopelessness or worthlessness.  He likes his medication area patient denies drinking alcohol or using any illegal substances.  He lives with his wife who is supportive.  His appetite is okay.  His vital signs are stable.   Visit Diagnosis:    ICD-9-CM ICD-10-CM   1. Bipolar I disorder (HCC) 296.7 F31.9 benztropine (COGENTIN) 1 MG tablet     escitalopram (LEXAPRO) 20 MG tablet     temazepam (RESTORIL) 15 MG capsule     traZODone (DESYREL) 100 MG tablet    Past Psychiatric History: Reviewed.   Patient has at least 4-5 psychiatric hospitalization.  His last psychiatric admission was in July 2014 .  He has a history of taking overdose on his medication and mixing with alcohol. He has a history of cutting his wrist.  He has taken Tegretol, Depakote, Ambien, Remeron, Vistaril , Geodon, Abilify, lithium, Provigil, Zoloft, Neurontin , Lamictal, Wellbutrin , Risperdal and Prstiq.  He had overdose on Latuda. Patient endorsed history of mania and severe impulsive behavior. He admitted history of buying unnecessary, getting speeding tickets and aggression.  Patient reported history of side effects due to polypharmacy.    Past Medical History:  Past Medical History:  Diagnosis Date  . Anxiety   . Asthma    as child, no problems now  . Bipolar disorder [facility])    Has psychiatrist.  BH  admission (suicidal) 07/20/11.  Marland Kitchen Cervical spondylosis    Primarily C4-5 and C5-6--referred to [facility] 02/2011.  . Depression   . GERD (gastroesophageal reflux disease)   . Hip pain, left 2011-2012   Left hip and groin: MRI pelvis and left hip 02/18/11 showed NORMAL hip, with small inguinal hernia containing fat and sigmoid diverticulosis.  Hip pain presumably referred pain from hernia/diverticular dz??.  . Hyperlipemia    Intol of meds: GI side effects  . Hypertension     no meds now  . Inguinal hernia 02/25/11   Left sided hernia with fat  . Left bundle branch block (LBBB) on electrocardiogram 10/29/2015   Rate related bundle branch block. Only noted once in February 2017.  Marland Kitchen Loss of hearing   . MRSA carrier    Intranasal bactroban tx 2011.  No hx of MRSA infection.  . Nephrolithiasis   . Overdose of benzodiazepine 04/30/2013   Status post  . Sleep apnea    states he has never had sleep study and only wife states he has OSA  . 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 ANKLE FRACTURE SURGERY     hardware still in both ankles (titanium)  . ANKLE SURGERY     Ankle tendon surgery  . CARDIAC CATHETERIZATION  1983   Angiographically normal coronary  arteries. LVH noted.  Marland Kitchen CARPOMETACARPEL SUSPENSION PLASTY Right 09/22/2016   Procedure: right thumb carpometacarpal  ARTHROPLASTY;  Surgeon: Leanora Cover, MD;  Location: [facility];  Service: Orthopedics;  Laterality: Right;  right thumb carpometacarpal  ARTHROPLASTY  . ELBOW SURGERY     left---tendon  . INGUINAL HERNIA REPAIR     Right side with mesh about 1993  . OPEN REDUCTION INTERNAL FIXATION (ORIF) DISTAL RADIAL FRACTURE Right 10/29/2015   Procedure: OPEN REDUCTION INTERNAL FIXATION (ORIF) DISTAL RADIAL FRACTURE;  Surgeon: Leanora Cover, MD;  Location: [place];  Service: Orthopedics;   Laterality: Right;  . TONSILLECTOMY    . TOTAL KNEE ARTHROPLASTY     Right and left (titanium)  . TRANSTHORACIC ECHOCARDIOGRAM  06/24/2016   Normal LV function EF 60-65%. Grade 1 diastolic dysfunction. Otherwise essentially normal.  . ULNAR COLLATERAL LIGAMENT REPAIR Right 12/29/2015   Procedure: REPAIR  RIGHT LATERAL ULNAR COLLATERAL LIGAMENT TEAR  EXTENSOR ORIGIN ;  Surgeon: Leanora Cover, MD;  Location: [place];  Service: Orthopedics;  Laterality: Right;  . WRIST ARTHROSCOPY WITH DEBRIDEMENT Right 07/14/2016   Procedure: RIGHT WRIST ARTHROSCOPY WITH DEBRIDEMENT  TRIANGULAR FIBROCARTILAGE COMPLEX;  Surgeon: Leanora Cover, MD;  Location: [place];  Service: Orthopedics;  Laterality: Right;    Family Psychiatric History: Reviewed.   Family History:  Family History  Problem Relation Age of Onset  . Alcohol abuse Mother   . Ovarian cancer Mother   . Alcohol abuse Father   . Pancreatic cancer Father   . Depression Daughter   . Paranoid behavior Daughter   . Hyperlipidemia Brother     Social History:  Social History   Social History  . Marital status: Married    Spouse name: N/A  . Number of children: N/A  . Years of education: N/A   Social History Main Topics  . Smoking status: Never Smoker  . Smokeless tobacco: Never Used  . Alcohol use 0.0 oz/week     Comment: One drink rarely  . Drug use: No  . Sexual activity: Not Currently    Birth control/ protection: None     Comment: Same partner for 45 years   Other Topics Concern  . None   Social History Narrative   Married, lives in [place].  Retired Dance movement psychotherapist.   Two daughters, both married,3 grandchildren ( 31, 53, 1 year)    No regular exercise.  Never smoker.  No ETOH.  No drugs.    Allergies:  Allergies  Allergen Reactions  . Codeine Shortness Of Breath  . Penicillins Hives, Rash and Other (See Comments)    Watery blisters Has patient had a PCN reaction causing immediate rash,  facial/tongue/throat swelling, SOB or lightheadedness with hypotension: No Has patient had a PCN reaction causing severe rash involving mucus membranes or skin necrosis: No Has patient had a PCN reaction that required hospitalization No Has patient had a PCN reaction occurring within the last 10 years: No If all of the above answers are "NO", then may proceed with Cephalosporin use.     Metabolic Disorder Labs: Lab Results  Component Value Date   HGBA1C 6.1 08/23/2016   MPG 123 (H) 12/03/2014   No results found for: PROLACTIN Lab Results  Component Value Date   CHOL 166 08/18/2016   TRIG (H) 08/18/2016    667.0 Triglyceride is over 400; calculations on Lipids are invalid.   HDL 32.30 (L) 08/18/2016   CHOLHDL 5 08/18/2016   VLDL  46.8 (H) 10/11/2013     Current Medications: Current Outpatient Prescriptions  Medication Sig Dispense Refill  . ARIPiprazole ER (ABILIFY MAINTENA) 400 MG SUSR Inject 400 mg into the muscle every 30 (thirty) days. 1 each 0  . aspirin 81 MG tablet Take 81 mg by mouth daily.    Marland Kitchen atorvastatin (LIPITOR) 40 MG tablet Take 1 tablet (40 mg total) by mouth daily. 90 tablet 3  . benztropine (COGENTIN) 1 MG tablet Take 1 tablet (1 mg total) by mouth daily. 30 tablet 2  . celecoxib (CELEBREX) 200 MG capsule TAKE 1 CAPSULE (200 MG TOTAL) BY MOUTH DAILY. TAKE WITH FOOD  2  . escitalopram (LEXAPRO) 20 MG tablet Take 1 tablet (20 mg total) by mouth daily. 30 tablet 2  . fenofibrate (TRICOR) 145 MG tablet Take 1 tablet (145 mg total) by mouth daily. 90 tablet 3  . Flaxseed, Linseed, (FLAXSEED OIL PO) Take 1 tablet by mouth daily.    Marland Kitchen lisinopril (PRINIVIL,ZESTRIL) 20 MG tablet Take by mouth.    Marland Kitchen lisinopril (PRINIVIL,ZESTRIL) 20 MG tablet TAKE 1 TABLET (20 MG TOTAL) BY MOUTH DAILY. 90 tablet 3  . Multiple Vitamin (MULTIVITAMIN WITH MINERALS) TABS tablet Take 1 tablet by mouth daily.    Marland Kitchen omeprazole (PRILOSEC) 40 MG capsule TAKE ONE CAPSULE BY MOUTH TWICE DAILY 60  capsule 5  . ondansetron (ZOFRAN) 4 MG tablet Take 1 tablet (4 mg total) by mouth every 8 (eight) hours as needed for nausea or vomiting. 20 tablet 0  . oxyCODONE-acetaminophen (PERCOCET) 5-325 MG tablet 1-2 tabs PO q6 hours prn pain 30 tablet 0  . oxymetazoline (AFRIN) 0.05 % nasal spray Place 1 spray into both nostrils 2 (two) times daily as needed for congestion.     . temazepam (RESTORIL) 15 MG capsule Take 1 capsule (15 mg total) by mouth at bedtime as needed. for sleep 30 capsule 2  . traZODone (DESYREL) 100 MG tablet Take 1 tablet (100 mg total) by mouth at bedtime. 30 tablet 2  . Vitamin D, Cholecalciferol, 1000 UNITS TABS Take 1 tablet by mouth daily.     Current Facility-Administered Medications  Medication Dose Route Frequency Provider Last Rate Last Dose  . ARIPiprazole ER PRSY 400 mg  400 mg Intramuscular Q28 days [name], MD   400 mg at 10/24/16 1555    Neurologic: Headache: No Seizure: No Paresthesias: No  Musculoskeletal: Strength & Muscle Tone: decreased Gait & Station: normal Patient leans: N/A  Psychiatric Specialty Exam: Review of Systems  Constitutional: Negative.   HENT: Negative.   Respiratory: Negative.   Musculoskeletal: Positive for joint pain.  Skin: Negative.   Neurological: Negative.     Blood pressure 120/68, pulse 68, height 5' 9.5" (1.765 m), weight 258 lb 12.8 oz (117.4 kg).Body mass index is 37.67 kg/m.  General Appearance: Casual  Eye Contact:  Good  Speech:  Clear and Coherent and Slow  Volume:  Normal  Mood:  Euthymic  Affect:  Congruent  Thought Process:  Goal Directed  Orientation:  Full (Time, Place, and Person)  Thought Content: WDL and Logical   Suicidal Thoughts:  No  Homicidal Thoughts:  No  Memory:  Immediate;   Good Recent;   Good Remote;   Good  Judgement:  Good  Insight:  Good  Psychomotor Activity:  Normal  Concentration:  Concentration: Good and Attention Span: Good  Recall:  Good  Fund of Knowledge: Good   Language: Good  Akathisia:  No  Handed:  Right  AIMS (if indicated):  0  Assets:  Communication Skills Desire for Improvement Housing Resilience Social Support  ADL's:  Intact  Cognition: WNL  Sleep:  good   Assessment: Bipolar disorder type I.  Plan: Patient is a stable on his current psychiatric medication.  I will continue Abilify injection 400 mg every 4 weeks.  Continue Cogentin 1 mg at bedtime, temazepam 15 mg at bedtime and Tylenol 100 mg at bedtime.  He is seeing [name] for counseling.  Follow-up in 3 months.Discussed medication side effects and benefits.  Recommended to call us back if there is any question, concern or worsening of the symptoms.  Discuss safety plan that anytime having active suicidal thoughts or homicidal thoughts and she need to call 911 or go to the local emergency room.   ARFEEN,SYED T., MD 11/10/2016, 4:28 PM

## 2016-11-22 ENCOUNTER — Ambulatory Visit (INDEPENDENT_AMBULATORY_CARE_PROVIDER_SITE_OTHER): Payer: Medicare HMO

## 2016-11-22 ENCOUNTER — Encounter (HOSPITAL_COMMUNITY): Payer: Self-pay

## 2016-11-22 VITALS — BP 118/66 | HR 77 | Ht 69.5 in | Wt 257.0 lb

## 2016-11-22 DIAGNOSIS — F319 Bipolar disorder, unspecified: Secondary | ICD-10-CM

## 2016-11-22 DIAGNOSIS — F3131 Bipolar disorder, current episode depressed, mild: Secondary | ICD-10-CM | POA: Diagnosis not present

## 2016-11-22 DIAGNOSIS — R69 Illness, unspecified: Secondary | ICD-10-CM | POA: Diagnosis not present

## 2016-11-22 NOTE — Progress Notes (Signed)
Patient presented with flat affect, level mood and reported doing fine with denial of any problems with mood.  Patient denies any suicidal or homicidal ideations, no auditory or visual hallucinations and discussed it had been 4 years since he had been inpatient for behavioral health issues.  Patient reported doing well with Abilify Maintena and denied any problems or concerns at this time.  States he is tired of having hand in a semi-cast and is seeing his orthopaedic provider again 11/23/16 to see if further surgeries will have to be done on his wrist.  Patient's due Abilify Maintena 400mg  IM injection prepared as ordered and given to patient in his left upper out gluteal area.  Patient tolerated injection without complaint of pain or discomfort and agreed to return in 1 month for next due injection.  Patient to call if any problems prior to then and left without any complaints of pain or discomfort.

## 2016-11-23 DIAGNOSIS — M25649 Stiffness of unspecified hand, not elsewhere classified: Secondary | ICD-10-CM | POA: Diagnosis not present

## 2016-11-23 DIAGNOSIS — M25639 Stiffness of unspecified wrist, not elsewhere classified: Secondary | ICD-10-CM | POA: Diagnosis not present

## 2016-11-23 DIAGNOSIS — M1811 Unilateral primary osteoarthritis of first carpometacarpal joint, right hand: Secondary | ICD-10-CM | POA: Diagnosis not present

## 2016-11-30 DIAGNOSIS — M25649 Stiffness of unspecified hand, not elsewhere classified: Secondary | ICD-10-CM | POA: Diagnosis not present

## 2016-11-30 DIAGNOSIS — R531 Weakness: Secondary | ICD-10-CM | POA: Diagnosis not present

## 2016-11-30 DIAGNOSIS — R69 Illness, unspecified: Secondary | ICD-10-CM | POA: Diagnosis not present

## 2016-11-30 DIAGNOSIS — M25639 Stiffness of unspecified wrist, not elsewhere classified: Secondary | ICD-10-CM | POA: Diagnosis not present

## 2016-11-30 DIAGNOSIS — F411 Generalized anxiety disorder: Secondary | ICD-10-CM | POA: Diagnosis not present

## 2016-12-07 DIAGNOSIS — M25641 Stiffness of right hand, not elsewhere classified: Secondary | ICD-10-CM | POA: Diagnosis not present

## 2016-12-07 DIAGNOSIS — M25631 Stiffness of right wrist, not elsewhere classified: Secondary | ICD-10-CM | POA: Diagnosis not present

## 2016-12-14 DIAGNOSIS — M25649 Stiffness of unspecified hand, not elsewhere classified: Secondary | ICD-10-CM | POA: Diagnosis not present

## 2016-12-22 DIAGNOSIS — M25649 Stiffness of unspecified hand, not elsewhere classified: Secondary | ICD-10-CM | POA: Diagnosis not present

## 2016-12-22 DIAGNOSIS — R531 Weakness: Secondary | ICD-10-CM | POA: Diagnosis not present

## 2016-12-22 DIAGNOSIS — M25639 Stiffness of unspecified wrist, not elsewhere classified: Secondary | ICD-10-CM | POA: Diagnosis not present

## 2016-12-26 ENCOUNTER — Ambulatory Visit (INDEPENDENT_AMBULATORY_CARE_PROVIDER_SITE_OTHER): Payer: Medicare HMO

## 2016-12-26 DIAGNOSIS — R2232 Localized swelling, mass and lump, left upper limb: Secondary | ICD-10-CM | POA: Diagnosis not present

## 2016-12-26 DIAGNOSIS — M19131 Post-traumatic osteoarthritis, right wrist: Secondary | ICD-10-CM | POA: Diagnosis not present

## 2016-12-26 DIAGNOSIS — M1811 Unilateral primary osteoarthritis of first carpometacarpal joint, right hand: Secondary | ICD-10-CM | POA: Diagnosis not present

## 2016-12-26 DIAGNOSIS — R69 Illness, unspecified: Secondary | ICD-10-CM | POA: Diagnosis not present

## 2016-12-26 DIAGNOSIS — M79645 Pain in left finger(s): Secondary | ICD-10-CM | POA: Diagnosis not present

## 2016-12-26 DIAGNOSIS — M1812 Unilateral primary osteoarthritis of first carpometacarpal joint, left hand: Secondary | ICD-10-CM | POA: Diagnosis not present

## 2016-12-26 DIAGNOSIS — F3110 Bipolar disorder, current episode manic without psychotic features, unspecified: Secondary | ICD-10-CM

## 2016-12-26 DIAGNOSIS — F319 Bipolar disorder, unspecified: Secondary | ICD-10-CM | POA: Diagnosis not present

## 2016-12-26 NOTE — Progress Notes (Signed)
Patient presented with normal affect, level mood and reported doing fine with denial of any problems with mood.  Patient denies any suicidal or homicidal ideations, no auditory or visual hallucinations and discussed it had been 4 years since he had been inpatient for behavioral health issues. Patient reported doing well with Abilify Maintena and denied any problems or concerns at this time. Patient's due Abilify Maintena 400mg  IM injection prepared as ordered and given to patient in his right upper out gluteal area.  Patient tolerated injection without complaint of pain or discomfort and agreed to return in 1 month for next due injection.  Patient to call if any problems prior to then and left without any complaints of pain or discomfort.

## 2017-01-04 DIAGNOSIS — K219 Gastro-esophageal reflux disease without esophagitis: Secondary | ICD-10-CM | POA: Diagnosis not present

## 2017-01-04 DIAGNOSIS — Z6827 Body mass index (BMI) 27.0-27.9, adult: Secondary | ICD-10-CM | POA: Diagnosis not present

## 2017-01-04 DIAGNOSIS — G4700 Insomnia, unspecified: Secondary | ICD-10-CM | POA: Diagnosis not present

## 2017-01-04 DIAGNOSIS — H9113 Presbycusis, bilateral: Secondary | ICD-10-CM | POA: Diagnosis not present

## 2017-01-04 DIAGNOSIS — E785 Hyperlipidemia, unspecified: Secondary | ICD-10-CM | POA: Diagnosis not present

## 2017-01-04 DIAGNOSIS — I10 Essential (primary) hypertension: Secondary | ICD-10-CM | POA: Diagnosis not present

## 2017-01-04 DIAGNOSIS — R69 Illness, unspecified: Secondary | ICD-10-CM | POA: Diagnosis not present

## 2017-01-04 DIAGNOSIS — Z0000 Encounter for general adult medical examination without abnormal findings: Secondary | ICD-10-CM | POA: Diagnosis not present

## 2017-01-04 DIAGNOSIS — E7800 Pure hypercholesterolemia, unspecified: Secondary | ICD-10-CM | POA: Diagnosis not present

## 2017-01-04 DIAGNOSIS — H9313 Tinnitus, bilateral: Secondary | ICD-10-CM | POA: Diagnosis not present

## 2017-01-17 ENCOUNTER — Encounter: Payer: Self-pay | Admitting: Adult Health

## 2017-01-17 ENCOUNTER — Ambulatory Visit (INDEPENDENT_AMBULATORY_CARE_PROVIDER_SITE_OTHER): Payer: Medicare HMO | Admitting: Adult Health

## 2017-01-17 VITALS — BP 120/52 | Temp 98.4°F | Wt 246.4 lb

## 2017-01-17 DIAGNOSIS — J069 Acute upper respiratory infection, unspecified: Secondary | ICD-10-CM | POA: Diagnosis not present

## 2017-01-17 LAB — POCT INFLUENZA A/B
INFLUENZA A, POC: NEGATIVE
INFLUENZA B, POC: NEGATIVE

## 2017-01-17 MED ORDER — HYDROCODONE-HOMATROPINE 5-1.5 MG/5ML PO SYRP: 5.0000 mL | ORAL_SOLUTION | Freq: Three times a day (TID) | ORAL | 0 refills | Status: DC | PRN

## 2017-01-17 MED ORDER — DOXYCYCLINE HYCLATE 100 MG PO CAPS: 100.0000 mg | ORAL_CAPSULE | Freq: Two times a day (BID) | ORAL | 0 refills | Status: DC

## 2017-01-17 MED ORDER — IPRATROPIUM-ALBUTEROL 0.5-2.5 (3) MG/3ML IN SOLN: 3.0000 mL | Freq: Once | RESPIRATORY_TRACT | Status: DC

## 2017-01-17 MED ORDER — PREDNISONE 10 MG PO TABS: ORAL_TABLET | ORAL | 0 refills | Status: DC

## 2017-01-17 NOTE — Patient Instructions (Signed)
WE NOW OFFER   [facility] FAST TRACK!!!  SAME DAY Appointments for ACUTE CARE  Such as: Sprains, Injuries, cuts, abrasions, rashes, muscle pain, joint pain, back pain Colds, flu, sore throats, headache, allergies, cough, fever  Ear pain, sinus and eye infections Abdominal pain, nausea, vomiting, diarrhea, upset stomach Animal/insect bites  3 Easy Ways to Schedule: Walk-In Scheduling Call in scheduling Mychart Sign-up: [email or web address]         

## 2017-01-17 NOTE — Progress Notes (Signed)
Subjective:    Patient ID: LEVII HAIRFIELD, male    DOB: 03/09/1941, 76 y.o.   MRN: [id number]  URI   This is a new problem. The current episode started 1 to 4 weeks ago (7-10 days ). The maximum temperature recorded prior to his arrival was 101 - 101.9 F. The fever has been present for 3 to 4 days. Associated symptoms include congestion, coughing (semi productive ), diarrhea (resolved), rhinorrhea and wheezing. Pertinent negatives include no ear pain, headaches, nausea, sinus pain or sore throat. He has tried inhaler use for the symptoms. The treatment provided no relief.    Review of Systems  Constitutional: Positive for activity change, diaphoresis, fatigue and fever.  HENT: Positive for congestion and rhinorrhea. Negative for ear discharge, ear pain, sinus pain, sinus pressure, sore throat and tinnitus.   Respiratory: Positive for cough (semi productive ), chest tightness, shortness of breath and wheezing.   Cardiovascular: Negative.   Gastrointestinal: Positive for diarrhea (resolved). Negative for nausea.  Neurological: Negative for headaches.  Psychiatric/Behavioral: Positive for sleep disturbance.     Past Medical History:  Diagnosis Date  . Anxiety   . Asthma    as child, no problems now  . Bipolar disorder [facility])    Has psychiatrist.  BH admission (suicidal) 07/20/11.  Marland Kitchen Cervical spondylosis    Primarily C4-5 and C5-6--referred to [facility] 02/2011.  . Depression   . GERD (gastroesophageal reflux disease)   . Hip pain, left 2011-2012   Left hip and groin: MRI pelvis and left hip 02/18/11 showed NORMAL hip, with small inguinal hernia containing fat and sigmoid diverticulosis.  Hip pain presumably referred pain from hernia/diverticular dz??.  . Hyperlipemia    Intol of meds: GI side effects  . Hypertension     no meds now  . Inguinal hernia 02/25/11   Left sided hernia with fat  . Left bundle branch block (LBBB) on electrocardiogram 10/29/2015     Rate related bundle branch block. Only noted once in February 2017.  Marland Kitchen Loss of hearing   . MRSA carrier    Intranasal bactroban tx 2011.  No hx of MRSA infection.  . Nephrolithiasis   . Overdose of benzodiazepine 04/30/2013   Status post  . Sleep apnea    states he has never had sleep study and only wife states he has OSA  . 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 Marital status: Married    Spouse name: N/A  . Number of children: N/A  . Years of education: N/A   Occupational History  . Not on file.   Social History Main Topics  . Smoking status: Never Smoker  . Smokeless tobacco: Never Used  . Alcohol use No     Comment: One drink rarely  . Drug use: No  . Sexual activity: Not Currently    Birth control/ protection: None     Comment: Same partner for 45 years   Other Topics Concern  . Not on file   Social History Narrative   Married, lives in [place].  Retired Dance movement psychotherapist.   Two daughters, both married,3 grandchildren ( 93, 52, 1 year)    No regular exercise.  Never smoker.  No ETOH.  No drugs.    Past Surgical History:  Procedure Laterality Date  . ANKLE FRACTURE SURGERY     hardware still in both ankles (titanium)  .  ANKLE SURGERY     Ankle tendon surgery  . CARDIAC CATHETERIZATION  1983   Angiographically normal coronary arteries. LVH noted.  Marland Kitchen CARPOMETACARPEL SUSPENSION PLASTY Right 09/22/2016   Procedure: right thumb carpometacarpal  ARTHROPLASTY;  Surgeon: Leanora Cover, MD;  Location: [facility];  Service: Orthopedics;  Laterality: Right;  right thumb carpometacarpal  ARTHROPLASTY  . ELBOW SURGERY     left---tendon  . INGUINAL HERNIA REPAIR     Right side with mesh about 1993  . OPEN REDUCTION INTERNAL FIXATION (ORIF) DISTAL RADIAL FRACTURE Right 10/29/2015   Procedure: OPEN REDUCTION INTERNAL FIXATION (ORIF) DISTAL RADIAL FRACTURE;  Surgeon: Leanora Cover, MD;   Location: [facility] [place];  Service: Orthopedics;  Laterality: Right;  . TONSILLECTOMY    . TOTAL KNEE ARTHROPLASTY     Right and left (titanium)  . TRANSTHORACIC ECHOCARDIOGRAM  06/24/2016   Normal LV function EF 60-65%. Grade 1 diastolic dysfunction. Otherwise essentially normal.  . ULNAR COLLATERAL LIGAMENT REPAIR Right 12/29/2015   Procedure: REPAIR  RIGHT LATERAL ULNAR COLLATERAL LIGAMENT TEAR  EXTENSOR ORIGIN ;  Surgeon: Leanora Cover, MD;  Location: [place];  Service: Orthopedics;  Laterality: Right;  . WRIST ARTHROSCOPY WITH DEBRIDEMENT Right 07/14/2016   Procedure: RIGHT WRIST ARTHROSCOPY WITH DEBRIDEMENT  TRIANGULAR FIBROCARTILAGE COMPLEX;  Surgeon: Leanora Cover, MD;  Location: [facility];  Service: Orthopedics;  Laterality: Right;    Family History  Problem Relation Age of Onset  . Alcohol abuse Mother   . Ovarian cancer Mother   . Alcohol abuse Father   . Pancreatic cancer Father   . Depression Daughter   . Paranoid behavior Daughter   . Hyperlipidemia Brother     Allergies  Allergen Reactions  . Codeine Shortness Of Breath  . Penicillins Hives, Rash and Other (See Comments)    Watery blisters Has patient had a PCN reaction causing immediate rash, facial/tongue/throat swelling, SOB or lightheadedness with hypotension: No Has patient had a PCN reaction causing severe rash involving mucus membranes or skin necrosis: No Has patient had a PCN reaction that required hospitalization No Has patient had a PCN reaction occurring within the last 10 years: No If all of the above answers are "NO", then may proceed with Cephalosporin use.     Current Outpatient Prescriptions on File Prior to Visit  Medication Sig Dispense Refill  . ARIPiprazole ER (ABILIFY MAINTENA) 400 MG SUSR Inject 400 mg into the muscle every 30 (thirty) days. 1 each 0  . aspirin 81 MG tablet Take 81 mg by mouth daily.    Marland Kitchen atorvastatin (LIPITOR) 40 MG tablet Take 1  tablet (40 mg total) by mouth daily. 90 tablet 3  . benztropine (COGENTIN) 1 MG tablet Take 1 tablet (1 mg total) by mouth daily. 30 tablet 2  . celecoxib (CELEBREX) 200 MG capsule TAKE 1 CAPSULE (200 MG TOTAL) BY MOUTH DAILY. TAKE WITH FOOD  2  . escitalopram (LEXAPRO) 20 MG tablet Take 1 tablet (20 mg total) by mouth daily. 30 tablet 2  . fenofibrate (TRICOR) 145 MG tablet Take 1 tablet (145 mg total) by mouth daily. 90 tablet 3  . Flaxseed, Linseed, (FLAXSEED OIL PO) Take 1 tablet by mouth daily.    Marland Kitchen lisinopril (PRINIVIL,ZESTRIL) 20 MG tablet Take by mouth.    Marland Kitchen lisinopril (PRINIVIL,ZESTRIL) 20 MG tablet TAKE 1 TABLET (20 MG TOTAL) BY MOUTH DAILY. 90 tablet 3  . Multiple Vitamin (MULTIVITAMIN WITH MINERALS) TABS tablet Take 1 tablet by mouth  daily.    . omeprazole (PRILOSEC) 40 MG capsule TAKE ONE CAPSULE BY MOUTH TWICE DAILY 60 capsule 5  . ondansetron (ZOFRAN) 4 MG tablet Take 1 tablet (4 mg total) by mouth every 8 (eight) hours as needed for nausea or vomiting. 20 tablet 0  . oxyCODONE-acetaminophen (PERCOCET) 5-325 MG tablet 1-2 tabs PO q6 hours prn pain 30 tablet 0  . oxymetazoline (AFRIN) 0.05 % nasal spray Place 1 spray into both nostrils 2 (two) times daily as needed for congestion.     . temazepam (RESTORIL) 15 MG capsule Take 1 capsule (15 mg total) by mouth at bedtime as needed. for sleep 30 capsule 2  . traZODone (DESYREL) 100 MG tablet Take 1 tablet (100 mg total) by mouth at bedtime. 30 tablet 2  . Vitamin D, Cholecalciferol, 1000 UNITS TABS Take 1 tablet by mouth daily.     Current Facility-Administered Medications on File Prior to Visit  Medication Dose Route Frequency Provider Last Rate Last Dose  . ARIPiprazole ER PRSY 400 mg  400 mg Intramuscular Q28 days [name], MD   400 mg at 12/26/16 1330    BP (!) 120/52 (BP Location: Right Arm, Patient Position: Sitting, Cuff Size: Large)   Temp 98.4 F (36.9 C)   Wt 246 lb 6.4 oz (111.8 kg)   BMI 36.39 kg/m         Objective:   Physical Exam  Constitutional: He is oriented to person, place, and time. He appears well-developed and well-nourished. No distress.  HENT:  Head: Normocephalic and atraumatic.  Right Ear: External ear normal.  Left Ear: External ear normal.  Nose: Nose normal.  Mouth/Throat: Oropharynx is clear and moist. No oropharyngeal exudate.  Eyes: Conjunctivae and EOM are normal. Pupils are equal, round, and reactive to light. Right eye exhibits no discharge. Left eye exhibits no discharge. No scleral icterus.  Neck: Normal range of motion. Neck supple. No thyromegaly present.  Cardiovascular: Normal rate, regular rhythm, normal heart sounds and intact distal pulses.  Exam reveals no gallop and no friction rub.   No murmur heard. Pulmonary/Chest: Effort normal. No respiratory distress. He has wheezes in the right upper field, the right middle field, the right lower field, the left upper field, the left middle field and the left lower field. He has rhonchi in the right upper field, the right middle field, the right lower field, the left upper field, the left middle field and the left lower field. He exhibits no tenderness.  Lymphadenopathy:    He has no cervical adenopathy.  Neurological: He is alert and oriented to person, place, and time.  Skin: Skin is warm. No rash noted. He is diaphoretic. No erythema. No pallor.  Psychiatric: He has a normal mood and affect. His behavior is normal. Judgment and thought content normal.  Nursing note and vitals reviewed.     Assessment & Plan:  1. Upper respiratory tract infection, unspecified type - Flu negative. There is concern for pneumonia. I am going to cover him with Doxycycline.  - doxycycline (VIBRAMYCIN) 100 MG capsule; Take 1 capsule (100 mg total) by mouth 2 (two) times daily.  Dispense: 20 capsule; Refill: 0 - predniSONE (DELTASONE) 10 MG tablet; 40 mg x 3 days, 20 mg x 3 days, 10 mg x 3 days  Dispense: 21 tablet; Refill: 0 -  HYDROcodone-homatropine (HYCODAN) 5-1.5 MG/5ML syrup; Take 5 mLs by mouth every 8 (eight) hours as needed for cough.  Dispense: 120 mL; Refill: 0 - POC  Influenza A/B - ipratropium-albuterol (DUONEB) 0.5-2.5 (3) MG/3ML nebulizer solution 3 mL; Take 3 mLs by nebulization once.  Patient reported feeling as though he could breath easier after duo neb. Rhonchi and wheezing had decreased significantly. After duo neb.   - Follow up if no improvement in 2-3 days or sooner if symptoms do not improve.   [name], NP

## 2017-01-24 ENCOUNTER — Encounter (HOSPITAL_COMMUNITY): Payer: Self-pay

## 2017-01-24 ENCOUNTER — Ambulatory Visit (INDEPENDENT_AMBULATORY_CARE_PROVIDER_SITE_OTHER): Payer: Medicare HMO

## 2017-01-24 VITALS — BP 118/62 | HR 67 | Ht 68.0 in | Wt 249.0 lb

## 2017-01-24 DIAGNOSIS — F3131 Bipolar disorder, current episode depressed, mild: Secondary | ICD-10-CM | POA: Diagnosis not present

## 2017-01-24 DIAGNOSIS — R69 Illness, unspecified: Secondary | ICD-10-CM | POA: Diagnosis not present

## 2017-01-24 DIAGNOSIS — F319 Bipolar disorder, unspecified: Secondary | ICD-10-CM

## 2017-01-24 NOTE — Progress Notes (Signed)
Patient presented with flat affect, level mood and scratchy voice today as reports he was recently diagnosed with pneumonia and is currently taking Doxycycline and Prednisone.  States he is feeling better than he was the previous week but just tired at times and still with some congestion.  States he has no suicidal or homicidal ideations, no auditory or visual hallucinations and reports he is very happy he has continued to stay on Abilify Maintena medication as reports he has not returned to inpatient since starting the medication several years back.  Patient's due Abilify Maintena 400 mg IM injection prepared as ordered and given to patient in his left upper outer gluteal quadrant.  Patient tolerated injection without complaint and agreed to return in 30 days for next due injection.  Patient to call if any problems or concerns prior to next appointment and stated plan to finish prednisone taper and antibiotic to heal from pneumonia.  Patient stable at this time and 97% O2.  Will call as needed.

## 2017-01-26 ENCOUNTER — Telehealth: Payer: Self-pay

## 2017-01-26 NOTE — Telephone Encounter (Signed)
Patient is on the list for [employer] 2018 and may be a good candidate for an AWV. Please let me know if/when appt is scheduled.   

## 2017-01-30 ENCOUNTER — Other Ambulatory Visit: Payer: Self-pay | Admitting: Adult Health

## 2017-02-02 ENCOUNTER — Telehealth (HOSPITAL_COMMUNITY): Payer: Self-pay

## 2017-02-02 NOTE — Telephone Encounter (Signed)
Medication management - Prior Authorization for patient's prescribed Benztropine completed online with CoverMyMeds and submitted to patient's plan for approval.

## 2017-02-03 DIAGNOSIS — R69 Illness, unspecified: Secondary | ICD-10-CM | POA: Diagnosis not present

## 2017-02-03 NOTE — Telephone Encounter (Signed)
Medication management - Fax received from patient's [place] insurance reporting he was approved for Benztropine medication, Referral #[id number] from 09/17/16 - 09/18/17. Message left at patient's [facility] that patient's Cogentin was approved and should be able to be filled now without problems. Requested pharmacy call back if any problems filling medication order.

## 2017-02-07 ENCOUNTER — Encounter (HOSPITAL_COMMUNITY): Payer: Self-pay | Admitting: Psychiatry

## 2017-02-07 ENCOUNTER — Ambulatory Visit (INDEPENDENT_AMBULATORY_CARE_PROVIDER_SITE_OTHER): Payer: Medicare HMO | Admitting: Psychiatry

## 2017-02-07 DIAGNOSIS — F319 Bipolar disorder, unspecified: Secondary | ICD-10-CM

## 2017-02-07 DIAGNOSIS — Z818 Family history of other mental and behavioral disorders: Secondary | ICD-10-CM

## 2017-02-07 DIAGNOSIS — Z811 Family history of alcohol abuse and dependence: Secondary | ICD-10-CM | POA: Diagnosis not present

## 2017-02-07 DIAGNOSIS — R69 Illness, unspecified: Secondary | ICD-10-CM | POA: Diagnosis not present

## 2017-02-07 MED ORDER — TEMAZEPAM 15 MG PO CAPS: 15.0000 mg | ORAL_CAPSULE | Freq: Every evening | ORAL | 0 refills | Status: DC | PRN

## 2017-02-07 MED ORDER — BENZTROPINE MESYLATE 1 MG PO TABS: 1.0000 mg | ORAL_TABLET | Freq: Every day | ORAL | 0 refills | Status: DC

## 2017-02-07 MED ORDER — TRAZODONE HCL 100 MG PO TABS: 100.0000 mg | ORAL_TABLET | Freq: Every day | ORAL | 0 refills | Status: DC

## 2017-02-07 NOTE — Progress Notes (Signed)
BH MD/PA/NP OP Progress Note  02/07/2017 4:10 PM Sean Hill  MRN:  [id number]  Chief Complaint:   Subjective:  I am doing good.  I'm taking the medication.  HPI: Joshual came for his follow-up appointment.  He is compliant with his medication and denies any side effects.  Lately she's been complaining off right knee joint and back pain.  He had knee replacement 15 years ago and he think his pain is coming back slowly.  He is getting Abilify injection every 4 weeks.  He denies any irritability, anger, mania, psychosis or any hallucination.  He believed that he is the best at this time.  He has no concerns other than some time dry mouth.  He has no tremors or shakes.  He denies any feeling of hopelessness or worthlessness.  Patient denies drinking alcohol or using any illegal substances.  He lives with his wife who is very supportive.  His appetite is okay.  His vital signs are stable.  Recently he had pneumonia and he finished antibiotic and he feels he is recovered fully.  His vital signs are stable.  Visit Diagnosis:    ICD-9-CM ICD-10-CM   1. Bipolar I disorder (HCC) 296.7 F31.9 traZODone (DESYREL) 100 MG tablet     temazepam (RESTORIL) 15 MG capsule     benztropine (COGENTIN) 1 MG tablet    Past Psychiatric History: Reviewed. Patient has at least 4-5 psychiatric hospitalization. His last psychiatric admission was in July 2014 . He has a history of taking overdose on his medication and mixing with alcohol. He has a history of cutting his wrist. He has taken Tegretol, Depakote, Ambien, Remeron, Vistaril , Geodon, Abilify, lithium, Provigil, Zoloft, Neurontin , Lamictal, Wellbutrin , Risperdal and Prstiq. He had overdose on Latuda. Patient endorsed history of mania and severe impulsive behavior. He admitted history of buying unnecessary, getting speeding tickets and aggression. Patient reported history of side effects due to polypharmacy.   Past Medical History:  Past Medical  History:  Diagnosis Date  . Anxiety   . Asthma    as child, no problems now  . Bipolar disorder [facility])    Has psychiatrist.  BH admission (suicidal) 07/20/11.  Marland Kitchen Cervical spondylosis    Primarily C4-5 and C5-6--referred to [facility] 02/2011.  . Depression   . GERD (gastroesophageal reflux disease)   . Hip pain, left 2011-2012   Left hip and groin: MRI pelvis and left hip 02/18/11 showed NORMAL hip, with small inguinal hernia containing fat and sigmoid diverticulosis.  Hip pain presumably referred pain from hernia/diverticular dz??.  . Hyperlipemia    Intol of meds: GI side effects  . Hypertension     no meds now  . Inguinal hernia 02/25/11   Left sided hernia with fat  . Left bundle branch block (LBBB) on electrocardiogram 10/29/2015   Rate related bundle branch block. Only noted once in February 2017.  Marland Kitchen Loss of hearing   . MRSA carrier    Intranasal bactroban tx 2011.  No hx of MRSA infection.  . Nephrolithiasis   . Overdose of benzodiazepine 04/30/2013   Status post  . Sleep apnea    states he has never had sleep study and only wife states he has OSA  . 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 ANKLE FRACTURE SURGERY     hardware still in both ankles (titanium)  .  ANKLE SURGERY     Ankle tendon surgery  . CARDIAC CATHETERIZATION  1983   Angiographically normal coronary arteries. LVH noted.  Marland Kitchen CARPOMETACARPEL SUSPENSION PLASTY Right 09/22/2016   Procedure: right thumb carpometacarpal  ARTHROPLASTY;  Surgeon: Leanora Cover, MD;  Location: [place];  Service: Orthopedics;  Laterality: Right;  right thumb carpometacarpal  ARTHROPLASTY  . ELBOW SURGERY     left---tendon  . INGUINAL HERNIA REPAIR     Right side with mesh about 1993  . OPEN REDUCTION INTERNAL FIXATION (ORIF) DISTAL RADIAL FRACTURE Right 10/29/2015   Procedure: OPEN REDUCTION INTERNAL  FIXATION (ORIF) DISTAL RADIAL FRACTURE;  Surgeon: Leanora Cover, MD;  Location: [place];  Service: Orthopedics;  Laterality: Right;  . TONSILLECTOMY    . TOTAL KNEE ARTHROPLASTY     Right and left (titanium)  . TRANSTHORACIC ECHOCARDIOGRAM  06/24/2016   Normal LV function EF 60-65%. Grade 1 diastolic dysfunction. Otherwise essentially normal.  . ULNAR COLLATERAL LIGAMENT REPAIR Right 12/29/2015   Procedure: REPAIR  RIGHT LATERAL ULNAR COLLATERAL LIGAMENT TEAR  EXTENSOR ORIGIN ;  Surgeon: Leanora Cover, MD;  Location: [place];  Service: Orthopedics;  Laterality: Right;  . WRIST ARTHROSCOPY WITH DEBRIDEMENT Right 07/14/2016   Procedure: RIGHT WRIST ARTHROSCOPY WITH DEBRIDEMENT  TRIANGULAR FIBROCARTILAGE COMPLEX;  Surgeon: Leanora Cover, MD;  Location: [place];  Service: Orthopedics;  Laterality: Right;    Family Psychiatric History: Reviewed.  Family History:  Family History  Problem Relation Age of Onset  . Alcohol abuse Mother   . Ovarian cancer Mother   . Alcohol abuse Father   . Pancreatic cancer Father   . Depression Daughter   . Paranoid behavior Daughter   . Hyperlipidemia Brother     Social History:  Social History   Social History  . Marital status: Married    Spouse name: N/A  . Number of children: N/A  . Years of education: N/A   Social History Main Topics  . Smoking status: Never Smoker  . Smokeless tobacco: Never Used  . Alcohol use No     Comment: One drink rarely  . Drug use: No  . Sexual activity: Not Currently    Birth control/ protection: None     Comment: Same partner for 45 years   Other Topics Concern  . Not on file   Social History Narrative   Married, lives in [place].  Retired Dance movement psychotherapist.   Two daughters, both married,3 grandchildren ( 67, 78, 1 year)    No regular exercise.  Never smoker.  No ETOH.  No drugs.    Allergies:  Allergies  Allergen Reactions  . Codeine Shortness Of Breath   . Penicillins Hives, Rash and Other (See Comments)    Watery blisters Has patient had a PCN reaction causing immediate rash, facial/tongue/throat swelling, SOB or lightheadedness with hypotension: No Has patient had a PCN reaction causing severe rash involving mucus membranes or skin necrosis: No Has patient had a PCN reaction that required hospitalization No Has patient had a PCN reaction occurring within the last 10 years: No If all of the above answers are "NO", then may proceed with Cephalosporin use.     Metabolic Disorder Labs: Lab Results  Component Value Date   HGBA1C 6.1 08/23/2016   MPG 123 (H) 12/03/2014   No results found for: PROLACTIN Lab Results  Component Value Date   CHOL 166 08/18/2016   TRIG (H) 08/18/2016    667.0 Triglyceride is over  400; calculations on Lipids are invalid.   HDL 32.30 (L) 08/18/2016   CHOLHDL 5 08/18/2016   VLDL 46.8 (H) 10/11/2013     Current Medications: Current Outpatient Prescriptions  Medication Sig Dispense Refill  . ARIPiprazole ER (ABILIFY MAINTENA) 400 MG SUSR Inject 400 mg into the muscle every 30 (thirty) days. 1 each 0  . aspirin 81 MG tablet Take 81 mg by mouth daily.    Marland Kitchen atorvastatin (LIPITOR) 40 MG tablet Take 1 tablet (40 mg total) by mouth daily. 90 tablet 3  . benztropine (COGENTIN) 1 MG tablet Take 1 tablet (1 mg total) by mouth daily. 30 tablet 2  . celecoxib (CELEBREX) 200 MG capsule TAKE 1 CAPSULE (200 MG TOTAL) BY MOUTH DAILY. TAKE WITH FOOD  2  . doxycycline (VIBRAMYCIN) 100 MG capsule Take 1 capsule (100 mg total) by mouth 2 (two) times daily. 20 capsule 0  . escitalopram (LEXAPRO) 20 MG tablet Take 1 tablet (20 mg total) by mouth daily. 30 tablet 2  . fenofibrate (TRICOR) 145 MG tablet Take 1 tablet (145 mg total) by mouth daily. 90 tablet 3  . Flaxseed, Linseed, (FLAXSEED OIL PO) Take 1 tablet by mouth daily.    Marland Kitchen HYDROcodone-homatropine (HYCODAN) 5-1.5 MG/5ML syrup Take 5 mLs by mouth every 8 (eight) hours as  needed for cough. 120 mL 0  . lisinopril (PRINIVIL,ZESTRIL) 20 MG tablet Take by mouth.    Marland Kitchen lisinopril (PRINIVIL,ZESTRIL) 20 MG tablet TAKE 1 TABLET (20 MG TOTAL) BY MOUTH DAILY. 90 tablet 3  . Multiple Vitamin (MULTIVITAMIN WITH MINERALS) TABS tablet Take 1 tablet by mouth daily.    Marland Kitchen omeprazole (PRILOSEC) 40 MG capsule TAKE ONE CAPSULE BY MOUTH TWICE DAILY 60 capsule 5  . oxymetazoline (AFRIN) 0.05 % nasal spray Place 1 spray into both nostrils 2 (two) times daily as needed for congestion.     . predniSONE (DELTASONE) 10 MG tablet 40 mg x 3 days, 20 mg x 3 days, 10 mg x 3 days 21 tablet 0  . temazepam (RESTORIL) 15 MG capsule Take 1 capsule (15 mg total) by mouth at bedtime as needed. for sleep 30 capsule 2  . traZODone (DESYREL) 100 MG tablet Take 1 tablet (100 mg total) by mouth at bedtime. 30 tablet 2  . Vitamin D, Cholecalciferol, 1000 UNITS TABS Take 1 tablet by mouth daily.     Current Facility-Administered Medications  Medication Dose Route Frequency Provider Last Rate Last Dose  . ARIPiprazole ER PRSY 400 mg  400 mg Intramuscular Q28 days [name], MD   400 mg at 01/24/17 1540  . ipratropium-albuterol (DUONEB) 0.5-2.5 (3) MG/3ML nebulizer solution 3 mL  3 mL Nebulization Once Nafziger, Tommi Rumps, NP        Neurologic: Headache: No Seizure: No Paresthesias: No  Musculoskeletal: Strength & Muscle Tone: within normal limits Gait & Station: normal Patient leans: N/A  Psychiatric Specialty Exam: Review of Systems  HENT:       Dry mouth  Musculoskeletal: Positive for back pain and joint pain.    Blood pressure 128/72, pulse 70, height 5\' 9"  (1.753 m), weight 246 lb 3.2 oz (111.7 kg).Body mass index is 36.36 kg/m.  General Appearance: Casual  Eye Contact:  Good  Speech:  Clear and Coherent  Volume:  Normal  Mood:  Euthymic  Affect:  Congruent  Thought Process:  Goal Directed  Orientation:  Full (Time, Place, and Person)  Thought Content: WDL and Logical   Suicidal  Thoughts:  No  Homicidal Thoughts:  No  Memory:  Immediate;   Good Recent;   Good Remote;   Good  Judgement:  Good  Insight:  Good  Psychomotor Activity:  Normal  Concentration:  Concentration: Good and Attention Span: Good  Recall:  Good  Fund of Knowledge: Good  Language: Good  Akathisia:  No  Handed:  Right  AIMS (if indicated):  0  Assets:  Communication Skills Desire for Improvement Housing Resilience Social Support  ADL's:  Intact  Cognition: WNL  Sleep:  good    Assessment: Bipolar disorder type I.  Plan: Patient is a stable on his current psychiatric medication.  I encouraged him to see [facility] orthopedic for knee joint pain.  I will continue Abilify injection 400 mg intramuscular every 4 weeks.  Continue trazodone 100 mg at bedtime, temazepam 15 mg at bedtime and Cogentin 1 mg at bedtime.  Encourage to drink water to avoid dry mouth.  Discussed medication side effects and benefits.  Patient is seeing [name] regularly for counseling.  Recommended to call us back if he is any question, concern or if he feel worsening of the symptom.  Follow-up in 3 months.  Berley Gambrell T., MD 02/07/2017, 4:10 PM

## 2017-02-18 IMAGING — CR DG WRIST COMPLETE 3+V*R*
4 series · 4 of 4 positions shown · non-contrast
Comparison: None.

CLINICAL DATA: Status post fall, right wrist pain

EXAM:
RIGHT WRIST - COMPLETE 3+ VIEW

[x wrist pa right]
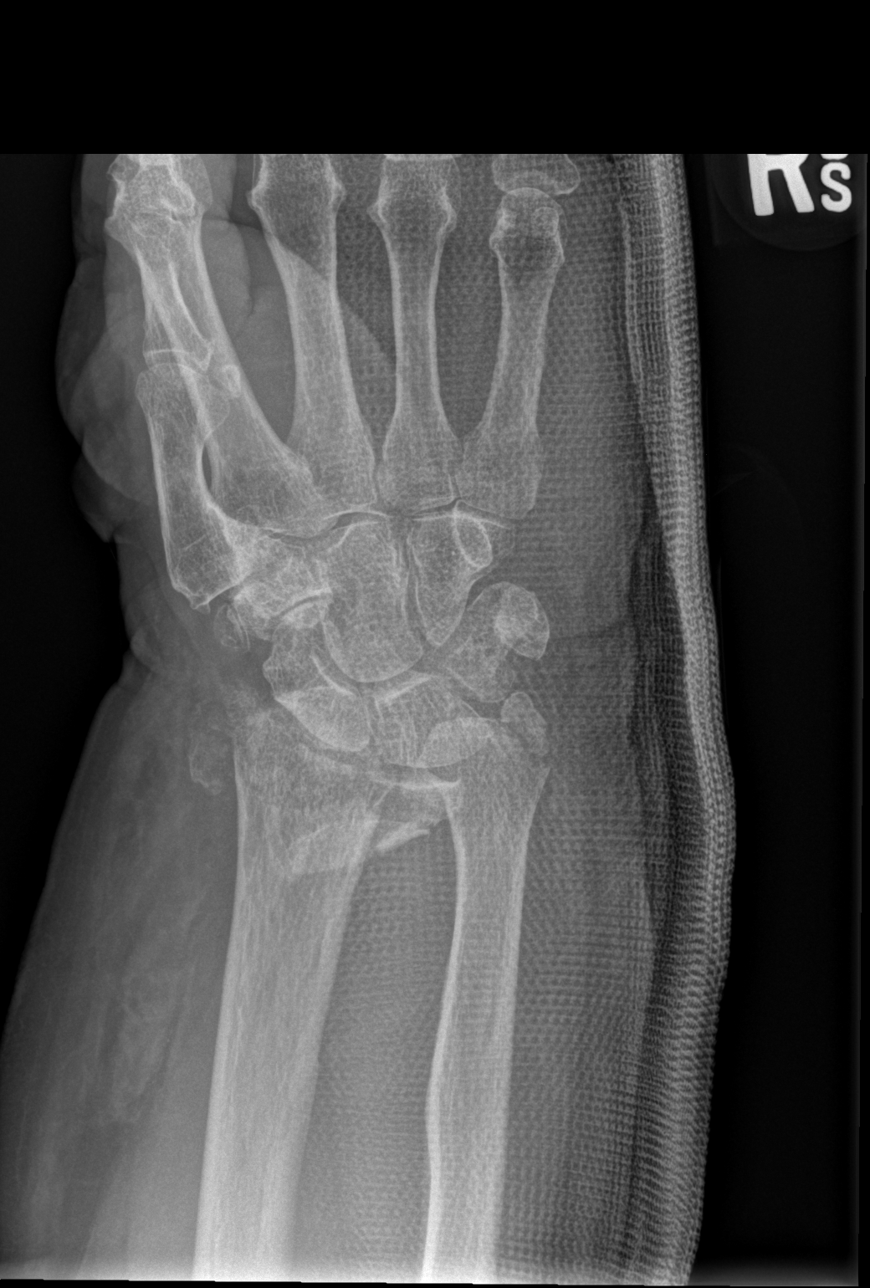

[x wrist obl right]
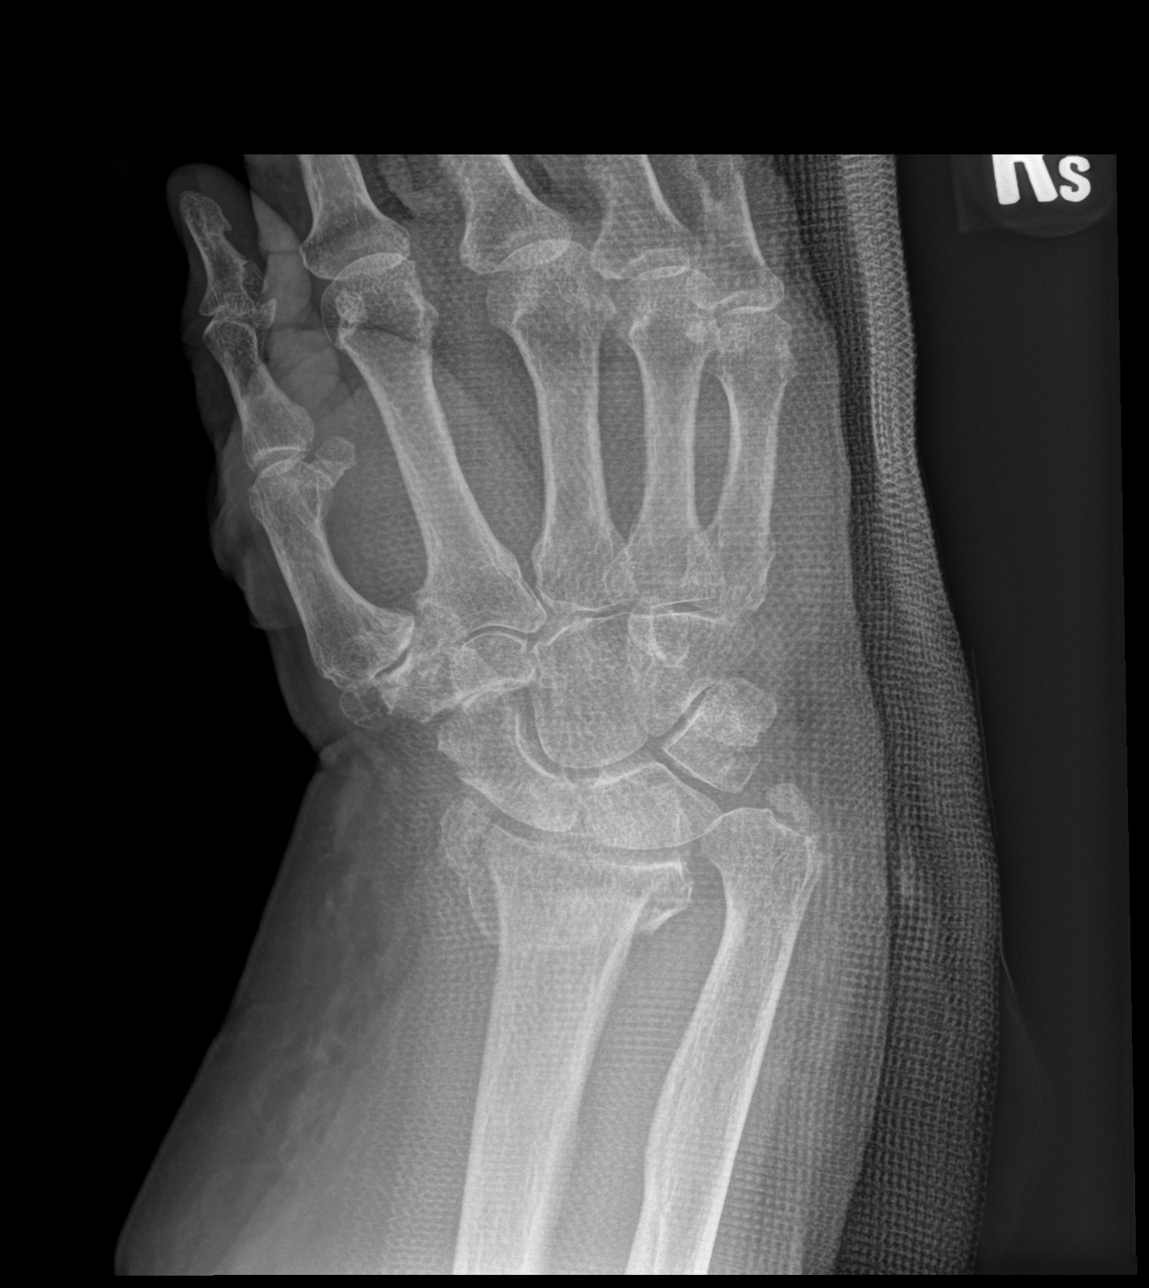

[x wrist lat right]
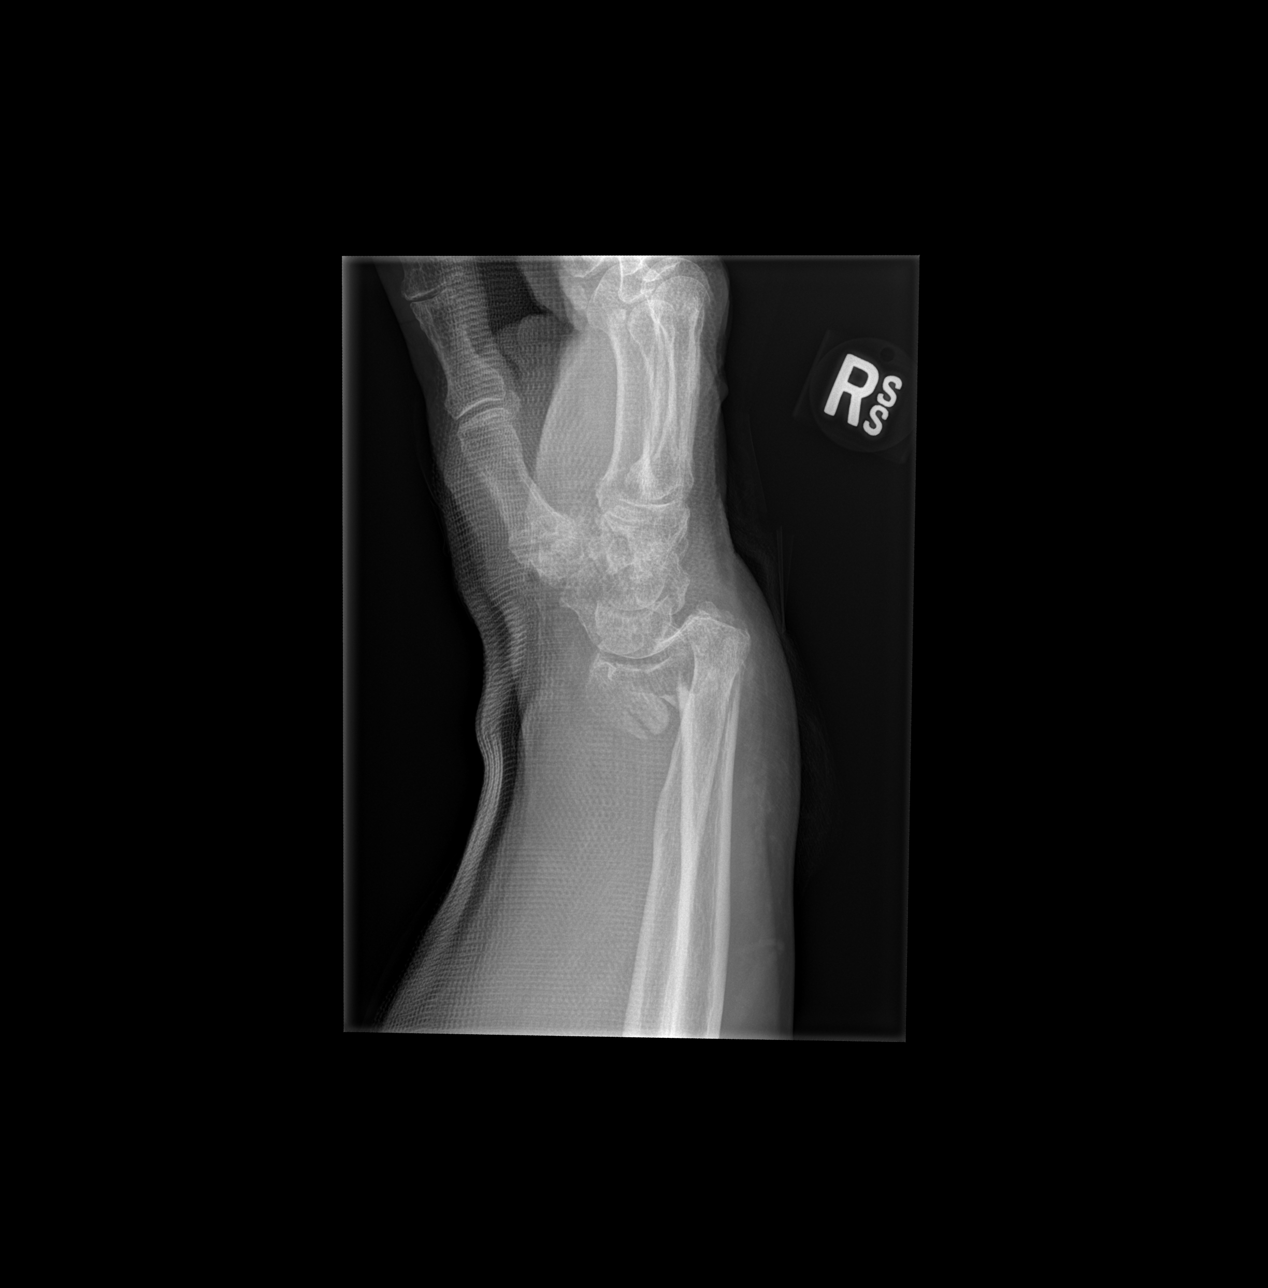

[x wrist navicular view right]
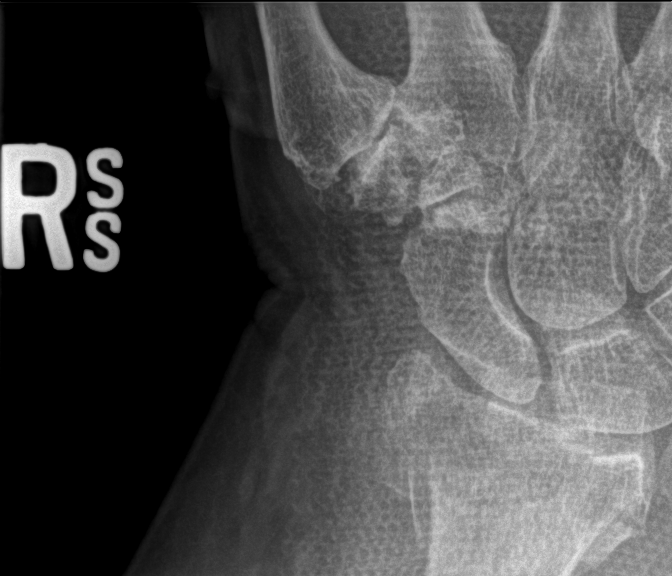

[4 of 4 positions shown; findings below may reference images not displayed]

FINDINGS: There is a comminuted distal radial metaphysis fracture with
articular surface involvement. There is 17 mm of volar displacement.

There is no radiocarpal dislocation.

There is a nondisplaced fracture at the base of the ulnar styloid
process.

There is generalized osteopenia.

There is severe osteoarthritis of the scaphotrapeziotrapezoid joint.
IMPRESSION: 1. Comminuted distal radial metaphysis fracture with articular
surface involvement. There is 17 mm of volar displacement.
2. Nondisplaced fracture at the base of the ulnar styloid process.

## 2017-02-24 ENCOUNTER — Ambulatory Visit (INDEPENDENT_AMBULATORY_CARE_PROVIDER_SITE_OTHER): Payer: Medicare HMO

## 2017-02-24 ENCOUNTER — Other Ambulatory Visit (INDEPENDENT_AMBULATORY_CARE_PROVIDER_SITE_OTHER): Payer: Medicare HMO | Admitting: Psychiatry

## 2017-02-24 DIAGNOSIS — F3131 Bipolar disorder, current episode depressed, mild: Secondary | ICD-10-CM

## 2017-02-24 DIAGNOSIS — F319 Bipolar disorder, unspecified: Secondary | ICD-10-CM

## 2017-02-24 DIAGNOSIS — R69 Illness, unspecified: Secondary | ICD-10-CM | POA: Diagnosis not present

## 2017-02-24 NOTE — Progress Notes (Signed)
Patient was given Abilify injection on his right thigh

## 2017-02-24 NOTE — Progress Notes (Signed)
sample was given to pt at 9:13 am in the rt upper outer quadrant. lot # [id number] jul 2019

## 2017-02-27 DIAGNOSIS — M1611 Unilateral primary osteoarthritis, right hip: Secondary | ICD-10-CM | POA: Diagnosis not present

## 2017-02-27 DIAGNOSIS — Z96651 Presence of right artificial knee joint: Secondary | ICD-10-CM | POA: Diagnosis not present

## 2017-03-10 ENCOUNTER — Encounter: Payer: Self-pay | Admitting: Adult Health

## 2017-03-10 ENCOUNTER — Ambulatory Visit (INDEPENDENT_AMBULATORY_CARE_PROVIDER_SITE_OTHER): Payer: Medicare HMO | Admitting: Adult Health

## 2017-03-10 VITALS — BP 110/60 | Temp 98.1°F | Ht 69.0 in | Wt 248.3 lb

## 2017-03-10 DIAGNOSIS — J0101 Acute recurrent maxillary sinusitis: Secondary | ICD-10-CM

## 2017-03-10 LAB — POCT RAPID STREP A (OFFICE): Rapid Strep A Screen: NEGATIVE

## 2017-03-10 MED ORDER — FLUTICASONE PROPIONATE 50 MCG/ACT NA SUSP: 2.0000 | Freq: Every day | NASAL | 6 refills | Status: DC

## 2017-03-10 MED ORDER — DOXYCYCLINE HYCLATE 100 MG PO CAPS
100.0000 mg | ORAL_CAPSULE | Freq: Two times a day (BID) | ORAL | 0 refills | Status: DC
Start: 2017-03-10 — End: 2017-09-21

## 2017-03-10 NOTE — Progress Notes (Signed)
Subjective:    Patient ID: Sean Hill, male    DOB: 1941/08/11, 76 y.o.   MRN: [id number]  HPI  76 year old male who  has a past medical history of Anxiety; Asthma; Bipolar disorder ([place]); Cervical spondylosis; Depression; GERD (gastroesophageal reflux disease); Hip pain, left (2011-2012); Hyperlipemia; Hypertension; Inguinal hernia (02/25/11); Left bundle branch block (LBBB) on electrocardiogram (10/29/2015); Loss of hearing; MRSA carrier; Nephrolithiasis; Overdose of benzodiazepine (04/30/2013); Sleep apnea; and Tinnitus.   He presents to the office today for chest congestion, productive cough, sore throat, right ear pain and sinus pain and pressure. He has had his symptoms for 7 days. He does not feel as though he is feeling any better than he has when it first started.   He denies any SOB, wheezing, n/v/d   Review of Systems  Constitutional: Positive for fatigue and unexpected weight change.  HENT: Positive for ear pain, postnasal drip, sinus pain, sinus pressure, sore throat and voice change. Negative for tinnitus.   Respiratory: Positive for cough.   Cardiovascular: Negative.   Gastrointestinal: Negative.   All other systems reviewed and are negative.  Past Medical History:  Diagnosis Date  . Anxiety   . Asthma    as child, no problems now  . Bipolar disorder [facility])    Has psychiatrist.  BH admission (suicidal) 07/20/11.  Marland Kitchen Cervical spondylosis    Primarily C4-5 and C5-6--referred to [facility] 02/2011.  . Depression   . GERD (gastroesophageal reflux disease)   . Hip pain, left 2011-2012   Left hip and groin: MRI pelvis and left hip 02/18/11 showed NORMAL hip, with small inguinal hernia containing fat and sigmoid diverticulosis.  Hip pain presumably referred pain from hernia/diverticular dz??.  . Hyperlipemia    Intol of meds: GI side effects  . Hypertension     no meds now  . Inguinal hernia 02/25/11   Left sided hernia with fat  . Left  bundle branch block (LBBB) on electrocardiogram 10/29/2015   Rate related bundle branch block. Only noted once in February 2017.  Marland Kitchen Loss of hearing   . MRSA carrier    Intranasal bactroban tx 2011.  No hx of MRSA infection.  . Nephrolithiasis   . Overdose of benzodiazepine 04/30/2013   Status post  . Sleep apnea    states he has never had sleep study and only wife states he has OSA  . 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 Marital status: Married    Spouse name: N/A  . Number of children: N/A  . Years of education: N/A   Occupational History  . Not on file.   Social History Main Topics  . Smoking status: Never Smoker  . Smokeless tobacco: Never Used  . Alcohol use No     Comment: One drink rarely  . Drug use: No  . Sexual activity: Not Currently    Birth control/ protection: None     Comment: Same partner for 45 years   Other Topics Concern  . Not on file   Social History Narrative   Married, lives in [place].  Retired Dance movement psychotherapist.   Two daughters, both married,3 grandchildren ( 45, 22, 1 year)    No regular exercise.  Never smoker.  No ETOH.  No drugs.    Past Surgical History:  Procedure Laterality Date  . ANKLE FRACTURE SURGERY  hardware still in both ankles (titanium)  . ANKLE SURGERY     Ankle tendon surgery  . CARDIAC CATHETERIZATION  1983   Angiographically normal coronary arteries. LVH noted.  Marland Kitchen CARPOMETACARPEL SUSPENSION PLASTY Right 09/22/2016   Procedure: right thumb carpometacarpal  ARTHROPLASTY;  Surgeon: Leanora Cover, MD;  Location: [place];  Service: Orthopedics;  Laterality: Right;  right thumb carpometacarpal  ARTHROPLASTY  . ELBOW SURGERY     left---tendon  . INGUINAL HERNIA REPAIR     Right side with mesh about 1993  . OPEN REDUCTION INTERNAL FIXATION (ORIF) DISTAL RADIAL FRACTURE Right 10/29/2015   Procedure: OPEN REDUCTION INTERNAL FIXATION  (ORIF) DISTAL RADIAL FRACTURE;  Surgeon: Leanora Cover, MD;  Location: [place];  Service: Orthopedics;  Laterality: Right;  . TONSILLECTOMY    . TOTAL KNEE ARTHROPLASTY     Right and left (titanium)  . TRANSTHORACIC ECHOCARDIOGRAM  06/24/2016   Normal LV function EF 60-65%. Grade 1 diastolic dysfunction. Otherwise essentially normal.  . ULNAR COLLATERAL LIGAMENT REPAIR Right 12/29/2015   Procedure: REPAIR  RIGHT LATERAL ULNAR COLLATERAL LIGAMENT TEAR  EXTENSOR ORIGIN ;  Surgeon: Leanora Cover, MD;  Location: [place];  Service: Orthopedics;  Laterality: Right;  . WRIST ARTHROSCOPY WITH DEBRIDEMENT Right 07/14/2016   Procedure: RIGHT WRIST ARTHROSCOPY WITH DEBRIDEMENT  TRIANGULAR FIBROCARTILAGE COMPLEX;  Surgeon: Leanora Cover, MD;  Location: [place];  Service: Orthopedics;  Laterality: Right;    Family History  Problem Relation Age of Onset  . Alcohol abuse Mother   . Ovarian cancer Mother   . Alcohol abuse Father   . Pancreatic cancer Father   . Depression Daughter   . Paranoid behavior Daughter   . Hyperlipidemia Brother     Allergies  Allergen Reactions  . Codeine Shortness Of Breath  . Penicillins Hives, Rash and Other (See Comments)    Watery blisters Has patient had a PCN reaction causing immediate rash, facial/tongue/throat swelling, SOB or lightheadedness with hypotension: No Has patient had a PCN reaction causing severe rash involving mucus membranes or skin necrosis: No Has patient had a PCN reaction that required hospitalization No Has patient had a PCN reaction occurring within the last 10 years: No If all of the above answers are "NO", then may proceed with Cephalosporin use.     Current Outpatient Prescriptions on File Prior to Visit  Medication Sig Dispense Refill  . ARIPiprazole ER (ABILIFY MAINTENA) 400 MG SUSR Inject 400 mg into the muscle every 30 (thirty) days. 1 each 0  . aspirin 81 MG tablet Take 81 mg by mouth  daily.    Marland Kitchen atorvastatin (LIPITOR) 40 MG tablet Take 1 tablet (40 mg total) by mouth daily. 90 tablet 3  . benztropine (COGENTIN) 1 MG tablet Take 1 tablet (1 mg total) by mouth daily. 90 tablet 0  . escitalopram (LEXAPRO) 20 MG tablet Take 1 tablet (20 mg total) by mouth daily. 30 tablet 2  . fenofibrate (TRICOR) 145 MG tablet Take 1 tablet (145 mg total) by mouth daily. 90 tablet 3  . Flaxseed, Linseed, (FLAXSEED OIL PO) Take 1 tablet by mouth daily.    Marland Kitchen lisinopril (PRINIVIL,ZESTRIL) 20 MG tablet TAKE 1 TABLET (20 MG TOTAL) BY MOUTH DAILY. 90 tablet 3  . Multiple Vitamin (MULTIVITAMIN WITH MINERALS) TABS tablet Take 1 tablet by mouth daily.    Marland Kitchen omeprazole (PRILOSEC) 40 MG capsule TAKE ONE CAPSULE BY MOUTH TWICE DAILY 60 capsule 5  . oxymetazoline (AFRIN) 0.05 %  nasal spray Place 1 spray into both nostrils 2 (two) times daily as needed for congestion.     . temazepam (RESTORIL) 15 MG capsule Take 1 capsule (15 mg total) by mouth at bedtime as needed. for sleep 90 capsule 0  . traZODone (DESYREL) 100 MG tablet Take 1 tablet (100 mg total) by mouth at bedtime. 90 tablet 0  . Vitamin D, Cholecalciferol, 1000 UNITS TABS Take 1 tablet by mouth daily.     Current Facility-Administered Medications on File Prior to Visit  Medication Dose Route Frequency Provider Last Rate Last Dose  . ARIPiprazole ER PRSY 400 mg  400 mg Intramuscular Q28 days [name], MD   400 mg at 02/24/17 0913  . ipratropium-albuterol (DUONEB) 0.5-2.5 (3) MG/3ML nebulizer solution 3 mL  3 mL Nebulization Once Christal Lagerstrom, NP        BP 110/60 (BP Location: Left Arm, Patient Position: Sitting, Cuff Size: Normal)   Temp 98.1 F (36.7 C) (Oral)   Ht 5\' 9"  (1.753 m)   Wt 248 lb 4.8 oz (112.6 kg)   BMI 36.67 kg/m       Objective:   Physical Exam  Constitutional: He is oriented to person, place, and time. He appears well-developed and well-nourished. No distress.  HENT:  Head: Normocephalic and atraumatic.  Right  Ear: Hearing, external ear and ear canal normal. Tympanic membrane is bulging. Tympanic membrane is not erythematous.  Left Ear: Hearing, external ear and ear canal normal. Tympanic membrane is not erythematous and not bulging.  Nose: Mucosal edema present. Right sinus exhibits maxillary sinus tenderness. Right sinus exhibits no frontal sinus tenderness. Left sinus exhibits maxillary sinus tenderness. Left sinus exhibits no frontal sinus tenderness.  Mouth/Throat: Uvula is midline, oropharynx is clear and moist and mucous membranes are normal. No oropharyngeal exudate, posterior oropharyngeal edema, posterior oropharyngeal erythema or tonsillar abscesses.  + PND  Cardiovascular: Normal rate, regular rhythm, normal heart sounds and intact distal pulses.  Exam reveals no gallop.   No murmur heard. Pulmonary/Chest: Effort normal and breath sounds normal. No respiratory distress. He has no wheezes. He has no rales. He exhibits no tenderness.  Neurological: He is alert and oriented to person, place, and time.  Skin: Skin is warm and dry. No rash noted. He is not diaphoretic. No erythema. No pallor.  Psychiatric: He has a normal mood and affect. His behavior is normal. Judgment and thought content normal.  Nursing note and vitals reviewed.     Assessment & Plan:  1. Acute recurrent maxillary sinusitis - POC Rapid Strep A- negative  - doxycycline (VIBRAMYCIN) 100 MG capsule; Take 1 capsule (100 mg total) by mouth 2 (two) times daily.  Dispense: 14 capsule; Refill: 0 - fluticasone (FLONASE) 50 MCG/ACT nasal spray; Place 2 sprays into both nostrils daily.  Dispense: 16 g; Refill: 6  [name], NP

## 2017-03-13 ENCOUNTER — Other Ambulatory Visit (HOSPITAL_COMMUNITY): Payer: Self-pay | Admitting: Psychiatry

## 2017-03-13 DIAGNOSIS — F319 Bipolar disorder, unspecified: Secondary | ICD-10-CM

## 2017-03-15 DIAGNOSIS — R69 Illness, unspecified: Secondary | ICD-10-CM | POA: Diagnosis not present

## 2017-03-15 DIAGNOSIS — F411 Generalized anxiety disorder: Secondary | ICD-10-CM | POA: Diagnosis not present

## 2017-03-15 DIAGNOSIS — M1611 Unilateral primary osteoarthritis, right hip: Secondary | ICD-10-CM | POA: Diagnosis not present

## 2017-03-23 ENCOUNTER — Ambulatory Visit (INDEPENDENT_AMBULATORY_CARE_PROVIDER_SITE_OTHER): Payer: Medicare HMO

## 2017-03-23 ENCOUNTER — Encounter (HOSPITAL_COMMUNITY): Payer: Self-pay

## 2017-03-23 VITALS — BP 128/76 | HR 81 | Ht 68.5 in | Wt 248.0 lb

## 2017-03-23 DIAGNOSIS — R69 Illness, unspecified: Secondary | ICD-10-CM | POA: Diagnosis not present

## 2017-03-23 DIAGNOSIS — F3131 Bipolar disorder, current episode depressed, mild: Secondary | ICD-10-CM | POA: Diagnosis not present

## 2017-03-23 DIAGNOSIS — F319 Bipolar disorder, unspecified: Secondary | ICD-10-CM

## 2017-03-23 MED ORDER — ESCITALOPRAM OXALATE 20 MG PO TABS
20.0000 mg | ORAL_TABLET | Freq: Every day | ORAL | 0 refills | Status: DC
Start: 2017-03-23 — End: 2017-04-25

## 2017-03-23 MED ORDER — ARIPIPRAZOLE ER 400 MG IM SRER: 400.0000 mg | INTRAMUSCULAR | 2 refills | Status: DC

## 2017-03-23 NOTE — Progress Notes (Signed)
Patient presented with appropriate affect, level mood and reported he was in need of a new Lexapro 20 mg order as states his pharmacy never received a new order after his last visit with Dr. Adele Schilder and he has been taking it daily but now is out.  Patient reported no problem with current medication regimen and stated doing "fine" with monthly Abilify Maintena injection   States he may have to have knee surgery at some point but not sure of when.  Met with Dr. Doyne Keel who provided a new one time order for patient's Lexapro 20 mg to be sent to his requested pharmacy since Dr. Adele Schilder is off today.  Dr. Doyne Keel also provided a new Abilify Maintena 400 mg IM injection order with 2 refills until patient returns to see Dr. Adele Schilder 06/08/17.  Both orders entered as verbally authorized.  Lexapro was e-scribed for one 30 day order to patient's requested [facility].  Patient's due Abilify Maintena 400 mg IM injection was prepared as ordered and given to patient in  His left upper outer gluteal area as patient had to change appointment to today when he was available to come in.  Patient tolerated due and ordered injection without any complaint of pain or discomfort and will return in 30 days for next due injection.  Patient to call as needed and denied any suicidal or homicidal ideations or other symptoms at this time.

## 2017-03-29 DIAGNOSIS — Z96651 Presence of right artificial knee joint: Secondary | ICD-10-CM | POA: Diagnosis not present

## 2017-03-29 DIAGNOSIS — M7061 Trochanteric bursitis, right hip: Secondary | ICD-10-CM | POA: Diagnosis not present

## 2017-03-29 DIAGNOSIS — M1611 Unilateral primary osteoarthritis, right hip: Secondary | ICD-10-CM | POA: Diagnosis not present

## 2017-04-15 IMAGING — MR MR ELBOW*R* W/CM
5 series · 35 of 40 positions shown · non-contrast
Comparison: None.

CLINICAL DATA: Right elbow pain, numbness, and weakness. Fall on
11/08/2015 on outstretched hand.

EXAM:
MRI OF THE RIGHT ELBOW WITH INTRA-ARTICULAR CONTRAST
TECHNIQUE: Multiplanar, multisequence MR imaging of the elbow was performed
following the administration of intra-articular contrast.

[Series 3: T1 fat-sat · axial · right · 3.0mm · 0.36mm/px · z∈[-32,+61]mm · 8 of 25 slices shown (1 of 4)]
[im 1/25]
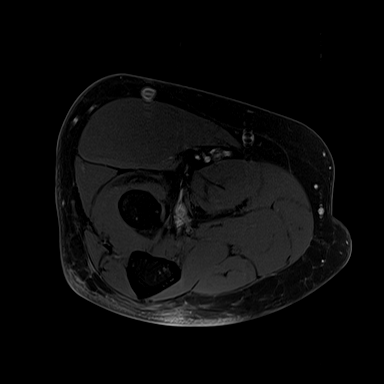
[im 4/25]
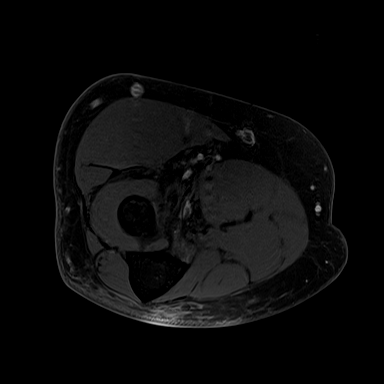
[im 7/25]
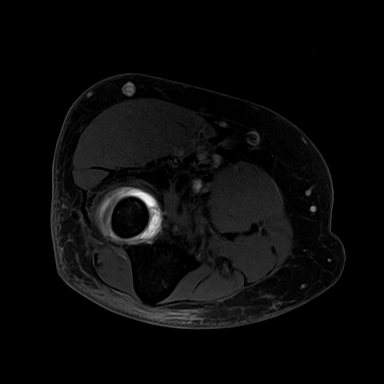
[im 11/25]
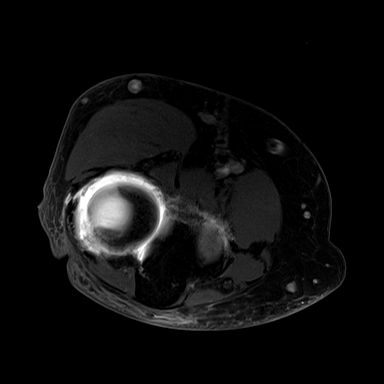
[im 14/25]
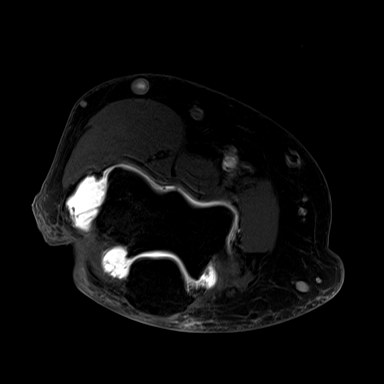
[im 18/25]
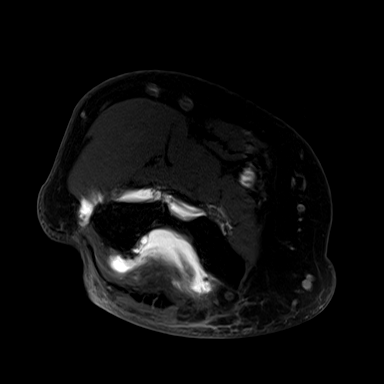
[im 21/25]
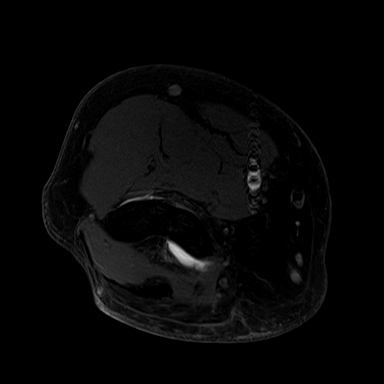
[im 25/25]
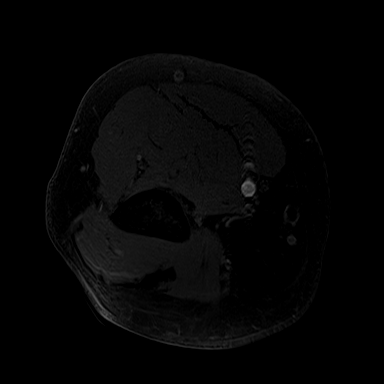

[Series 4: T1 fat-sat · coronal · right · 3.0mm · 0.36mm/px · 8 of 25 slices shown (2 of 4)]
[im 1/25]
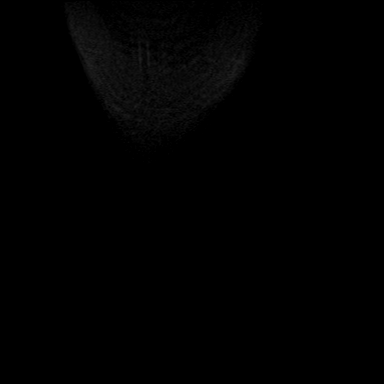
[im 4/25]
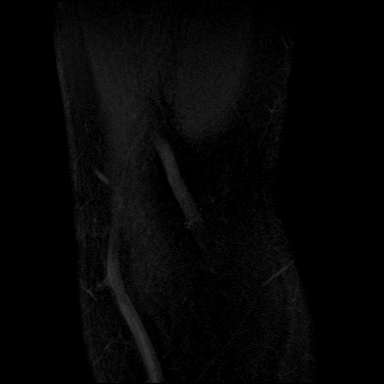
[im 7/25]
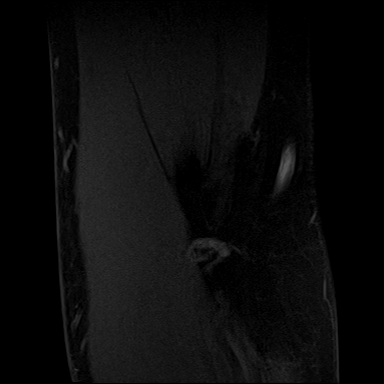
[im 11/25]
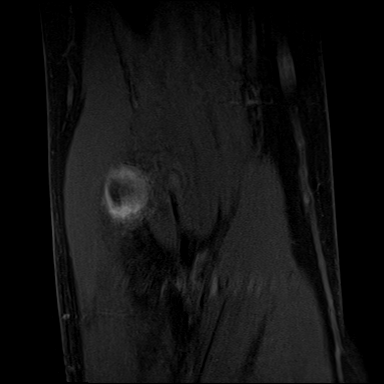
[im 14/25]
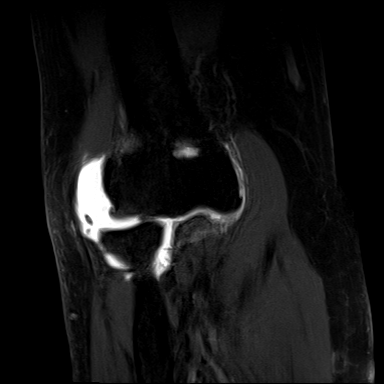
[im 18/25]
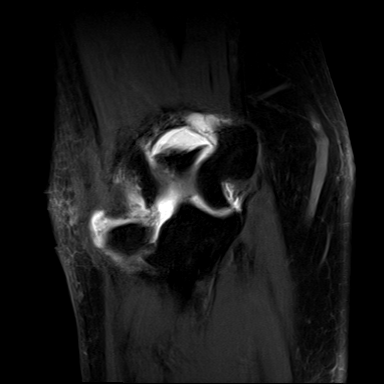
[im 21/25]
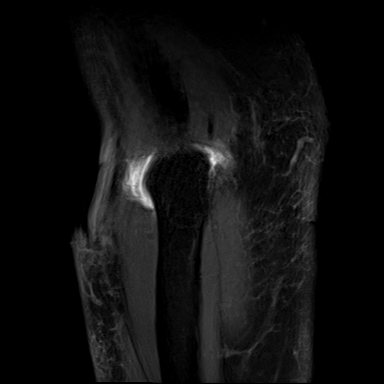
[im 25/25]
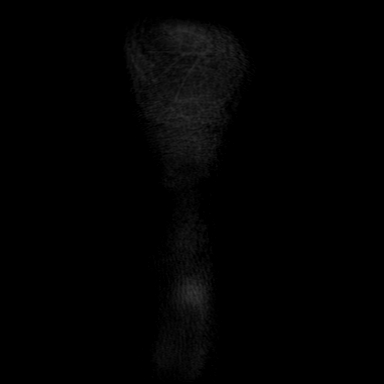

[Series 5: T1 fat-sat · coronal · right · 3.0mm · 0.36mm/px · 8 of 25 slices shown (3 of 4)]
[im 1/25]
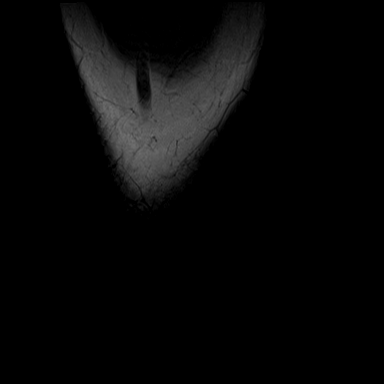
[im 4/25]
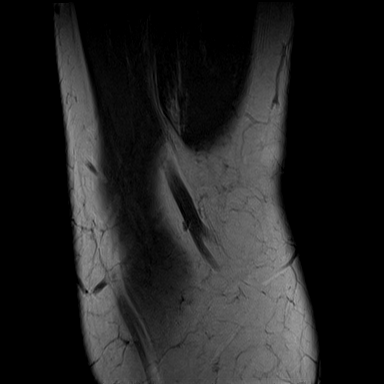
[im 7/25]
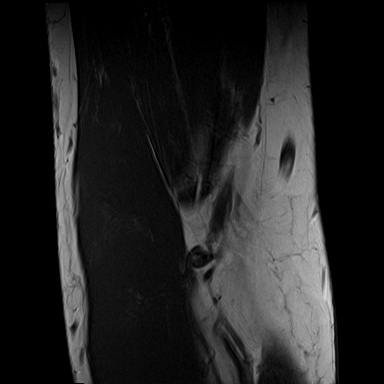
[im 11/25]
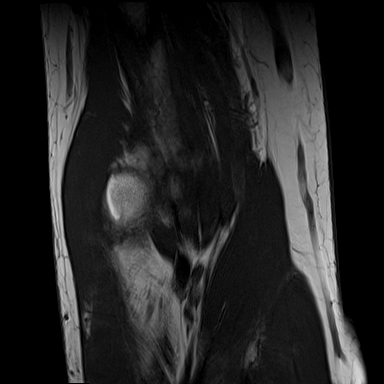
[im 14/25]
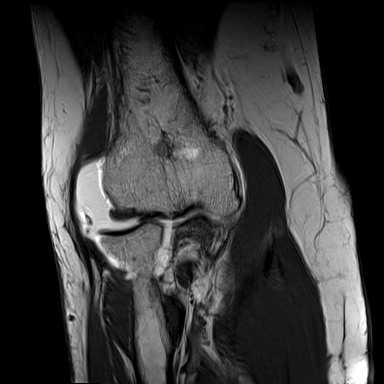
[im 18/25]
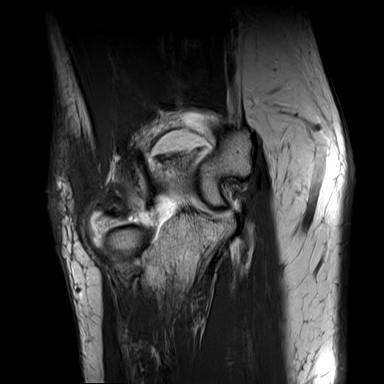
[im 21/25]
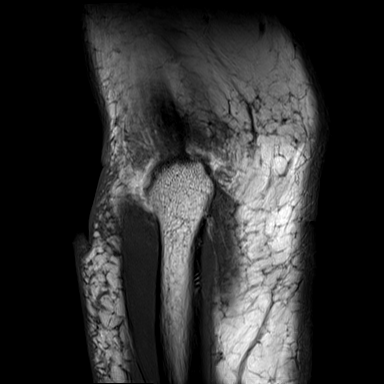
[im 25/25]
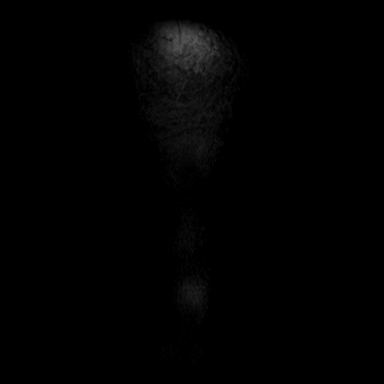

[Series 6: T2 fat-sat · coronal · right · 3.0mm · 0.44mm/px · 8 of 25 slices shown]
[im 1/25]
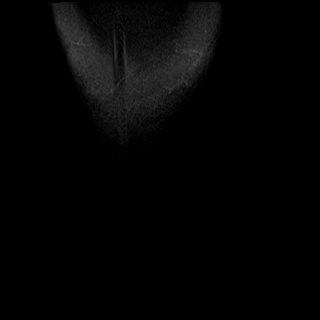
[im 4/25]
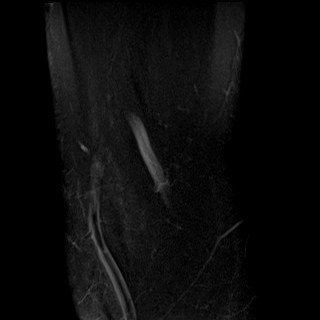
[im 7/25]
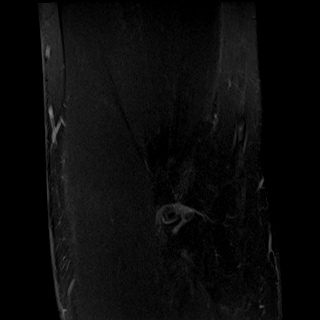
[im 11/25]
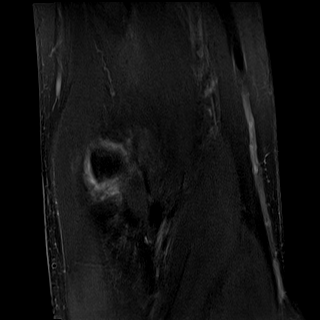
[im 14/25]
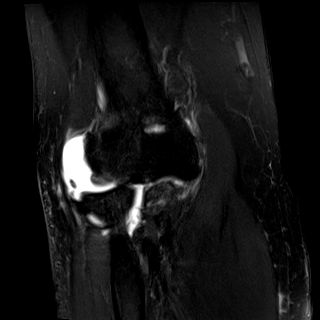
[im 18/25]
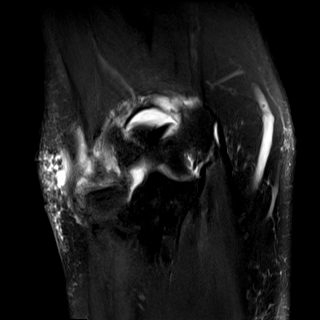
[im 21/25]
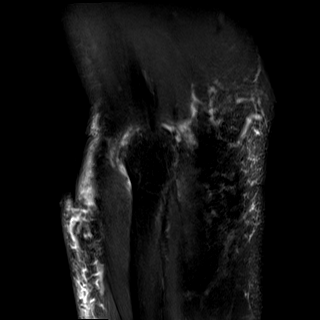
[im 25/25]
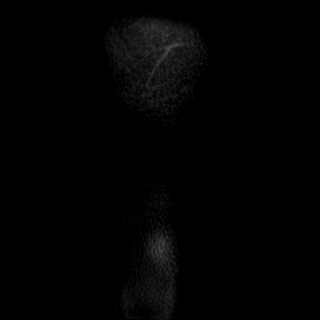

[Series 7: T1 fat-sat · sagittal · right · 3.0mm · 0.36mm/px · 3 of 25 slices shown (4 of 4)]
[im 1/25]
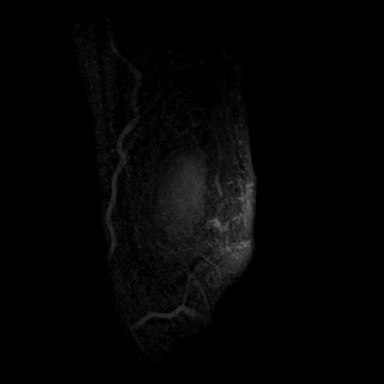
[im 4/25]
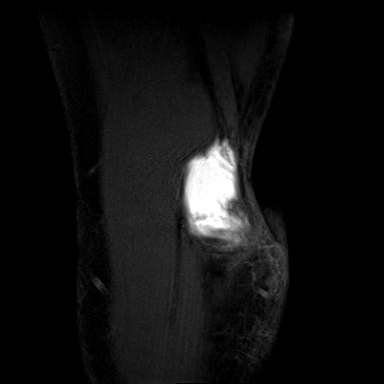
[im 7/25]
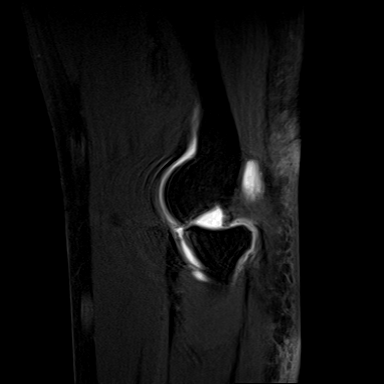

[35 of 40 positions shown; findings below may reference images not displayed]

FINDINGS: Despite efforts by the technologist and patient, motion artifact is
present on today's exam and could not be eliminated. This reduces
exam sensitivity and specificity.

TENDONS

Common forearm flexor origin: Partially torn at the origination
site, images 7 through 9 series 6, without total disruption.

Common forearm extensor origin: Common extensor tendon is torn away
from the lateral epicondyle.

Biceps: Mild distal tendinopathy.

Triceps: Mild distal tendinopathy

LIGAMENTS

Medial stabilizers: Partially torn proximally, images 8 through 10
series 6. There also appears to be some mild tearing of the ulnar
collateral ligament at the sublime tubercle.

Lateral stabilizers: Ruptured fibular collateral ligament and
ruptured lateral ulnar collateral ligament.

Cartilage: Severe chondral thinning and potentially chronic
impaction of the lateral femoral condyle. Fissuring along the
intercondylar portion of the distal humeral articular surface, image
[DATE].

Joint: The joint is distended with contrast. I do not see a free
fragment.

Cubital tunnel: Unremarkable

Bones: Mild posterior displacement the radial head with respect to
the lateral femoral condyle. Flattening and marginal spurring of the
lateral femoral condyle, likely chronic.
IMPRESSION: 1. Ruptured common extensor tendon and ruptured fibular collateral
ligament. Completely torn lateral ulnar collateral ligament.
2. Partial tearing of the common flexor tendon, with partial tearing
of the ulnar collateral ligament proximally and mild tearing of the
ulnar collateral ligament distally at the sublime tubercle.
3. Flattening suggesting impaction along the lateral femoral
condyle, although likely chronic given the lack of marrow edema.
Fissuring along the intercondylar portion of the distal humeral
articular surface within the cartilage. Mild posterior displacement
of the radial head with respect to the lateral femoral condyle.
4. Mild distal biceps and triceps tendinopathy.

## 2017-04-19 DIAGNOSIS — T8484XD Pain due to internal orthopedic prosthetic devices, implants and grafts, subsequent encounter: Secondary | ICD-10-CM | POA: Diagnosis not present

## 2017-04-19 DIAGNOSIS — Z96651 Presence of right artificial knee joint: Secondary | ICD-10-CM | POA: Diagnosis not present

## 2017-04-23 ENCOUNTER — Other Ambulatory Visit (HOSPITAL_COMMUNITY): Payer: Self-pay | Admitting: Psychiatry

## 2017-04-23 DIAGNOSIS — F319 Bipolar disorder, unspecified: Secondary | ICD-10-CM

## 2017-04-25 ENCOUNTER — Ambulatory Visit (INDEPENDENT_AMBULATORY_CARE_PROVIDER_SITE_OTHER): Payer: Medicare HMO

## 2017-04-25 DIAGNOSIS — F319 Bipolar disorder, unspecified: Secondary | ICD-10-CM | POA: Diagnosis not present

## 2017-04-25 DIAGNOSIS — R69 Illness, unspecified: Secondary | ICD-10-CM | POA: Diagnosis not present

## 2017-04-25 DIAGNOSIS — F3131 Bipolar disorder, current episode depressed, mild: Secondary | ICD-10-CM

## 2017-04-25 MED ORDER — ESCITALOPRAM OXALATE 20 MG PO TABS: 20.0000 mg | ORAL_TABLET | Freq: Every day | ORAL | 0 refills | Status: DC

## 2017-04-25 NOTE — Progress Notes (Signed)
Patient arrived with appropriate mood and affect for his injection of Abilify Maintena 400 mg. Patient presents in a good mood, reports no side effects, no SI/HI and no visual or audio hallucinations. The Abilify Maintena 400 mg injection was prepared as ordered and given to patient in his upper right gluteal quadrant. Patient tolerated the procedure well and without complaint and will be back in 30 days. Patient will call if he develops any symptoms or has any questions.

## 2017-04-27 DIAGNOSIS — F411 Generalized anxiety disorder: Secondary | ICD-10-CM | POA: Diagnosis not present

## 2017-04-27 DIAGNOSIS — R69 Illness, unspecified: Secondary | ICD-10-CM | POA: Diagnosis not present

## 2017-05-08 DIAGNOSIS — R69 Illness, unspecified: Secondary | ICD-10-CM | POA: Diagnosis not present

## 2017-05-12 ENCOUNTER — Other Ambulatory Visit (HOSPITAL_COMMUNITY): Payer: Self-pay

## 2017-05-12 DIAGNOSIS — F319 Bipolar disorder, unspecified: Secondary | ICD-10-CM

## 2017-05-12 MED ORDER — TRAZODONE HCL 100 MG PO TABS: 100.0000 mg | ORAL_TABLET | Freq: Every day | ORAL | 0 refills | Status: DC

## 2017-05-15 ENCOUNTER — Other Ambulatory Visit: Payer: Self-pay

## 2017-05-15 DIAGNOSIS — R69 Illness, unspecified: Secondary | ICD-10-CM | POA: Diagnosis not present

## 2017-05-15 DIAGNOSIS — F319 Bipolar disorder, unspecified: Secondary | ICD-10-CM

## 2017-05-15 MED ORDER — TEMAZEPAM 15 MG PO CAPS: 15.0000 mg | ORAL_CAPSULE | Freq: Every evening | ORAL | 0 refills | Status: DC | PRN

## 2017-05-23 DIAGNOSIS — R69 Illness, unspecified: Secondary | ICD-10-CM | POA: Diagnosis not present

## 2017-05-24 ENCOUNTER — Ambulatory Visit (INDEPENDENT_AMBULATORY_CARE_PROVIDER_SITE_OTHER): Payer: Medicare HMO

## 2017-05-24 DIAGNOSIS — F3131 Bipolar disorder, current episode depressed, mild: Secondary | ICD-10-CM

## 2017-05-24 DIAGNOSIS — F310 Bipolar disorder, current episode hypomanic: Secondary | ICD-10-CM

## 2017-05-24 DIAGNOSIS — R69 Illness, unspecified: Secondary | ICD-10-CM | POA: Diagnosis not present

## 2017-05-24 NOTE — Progress Notes (Signed)
Patient arrived with appropriate mood and affect for his injection of Abilify Maintena 400 mg. Patient presents in a good mood, reports no side effects, no SI/HI and no visual or audio hallucinations. Patient states he has been having some manic symptoms, he sees the doctor in 2 weeks and will address at that time. The Abilify Maintena 400 mg injection was prepared as ordered and given to patient in his upper left gluteal quadrant. Patient tolerated the procedure well and without complaint and will be back in 30 days. Patient will call if he develops any symptoms or has any questions.

## 2017-05-27 ENCOUNTER — Other Ambulatory Visit: Payer: Self-pay | Admitting: Adult Health

## 2017-05-27 DIAGNOSIS — E785 Hyperlipidemia, unspecified: Secondary | ICD-10-CM

## 2017-06-06 ENCOUNTER — Other Ambulatory Visit (HOSPITAL_COMMUNITY): Payer: Self-pay

## 2017-06-06 DIAGNOSIS — F319 Bipolar disorder, unspecified: Secondary | ICD-10-CM

## 2017-06-06 MED ORDER — BENZTROPINE MESYLATE 1 MG PO TABS: 1.0000 mg | ORAL_TABLET | Freq: Every day | ORAL | 0 refills | Status: DC

## 2017-06-08 ENCOUNTER — Ambulatory Visit (HOSPITAL_COMMUNITY): Payer: Self-pay | Admitting: Psychiatry

## 2017-06-09 ENCOUNTER — Encounter: Payer: Self-pay | Admitting: Adult Health

## 2017-06-20 ENCOUNTER — Ambulatory Visit (INDEPENDENT_AMBULATORY_CARE_PROVIDER_SITE_OTHER): Payer: Medicare HMO

## 2017-06-20 ENCOUNTER — Encounter (HOSPITAL_COMMUNITY): Payer: Self-pay | Admitting: Psychiatry

## 2017-06-20 ENCOUNTER — Ambulatory Visit (INDEPENDENT_AMBULATORY_CARE_PROVIDER_SITE_OTHER): Payer: Medicare HMO | Admitting: Psychiatry

## 2017-06-20 DIAGNOSIS — F319 Bipolar disorder, unspecified: Secondary | ICD-10-CM

## 2017-06-20 DIAGNOSIS — Z79899 Other long term (current) drug therapy: Secondary | ICD-10-CM | POA: Diagnosis not present

## 2017-06-20 DIAGNOSIS — F3112 Bipolar disorder, current episode manic without psychotic features, moderate: Secondary | ICD-10-CM

## 2017-06-20 DIAGNOSIS — R69 Illness, unspecified: Secondary | ICD-10-CM | POA: Diagnosis not present

## 2017-06-20 MED ORDER — ESCITALOPRAM OXALATE 10 MG PO TABS: 10.0000 mg | ORAL_TABLET | Freq: Every day | ORAL | 0 refills | Status: DC

## 2017-06-20 MED ORDER — TEMAZEPAM 15 MG PO CAPS: 15.0000 mg | ORAL_CAPSULE | Freq: Every evening | ORAL | 0 refills | Status: DC | PRN

## 2017-06-20 MED ORDER — TRAZODONE HCL 100 MG PO TABS: ORAL_TABLET | ORAL | 0 refills | Status: DC

## 2017-06-20 MED ORDER — BENZTROPINE MESYLATE 1 MG PO TABS: 1.0000 mg | ORAL_TABLET | Freq: Every day | ORAL | 0 refills | Status: DC

## 2017-06-20 NOTE — Progress Notes (Signed)
    Patient arrived with appropriate mood and affect for his injection of Abilify Maintena 400 mg. Patient presents in a good mood, reports no side effects, no SI/HI and no visual or audio hallucinations. The Abilify Maintena 400 mg injection was prepared as ordered and given to patient in his upper left gluteal quadrant. Patient tolerated the procedure well and without complaint and will be back in 30 days. Patient will call if he develops any symptoms or has any questions.

## 2017-06-20 NOTE — Progress Notes (Signed)
BH MD/PA/NP OP Progress Note  06/20/2017 3:52 PM Sean Hill  MRN:  [id number]  Chief Complaint:  I'm doing fine but sometime I forget things.  HPI: Sean Hill came for his follow-up appointment.  He is compliant with medication and reported no concerns.  However sometime he feel that his memory is not as sharp.  He forgets direction.  He is not sure if medicine causing it but he has noticed since taking the Lexapro he's been more forgetful.  But he is happy because his mood is stable.  He sleeping good.  He denies any irritability, mania, psychosis, hallucination or any racing thoughts.  He denies any feeling of hopelessness or worthlessness.  He has no tremors or shakes.  He denies any delusions.  His energy level is good.  His appetite is okay.  He denies drinking or using any illegal substances.  He lives with his wife is supportive.  He continues to see Sean Hill on a regular basis for coping skills.  Visit Diagnosis:    ICD-10-CM   1. Bipolar I disorder (HCC) F31.9 traZODone (DESYREL) 100 MG tablet    temazepam (RESTORIL) 15 MG capsule    benztropine (COGENTIN) 1 MG tablet    escitalopram (LEXAPRO) 10 MG tablet    Past Psychiatric History: Reviewed. Patient has at least 4-5 psychiatric hospitalization. His last psychiatric admission was in July 2014 . He has a history of taking overdose on his medication and mixing with alcohol. He has a history of cutting his wrist. He has taken Tegretol, Depakote, Ambien, Remeron, Vistaril , Geodon, Abilify, lithium, Provigil, Zoloft, Neurontin , Lamictal, Wellbutrin , Risperdal and Prstiq. He had overdose on Latuda. Patient endorsed history of mania and severe impulsive behavior. He admitted history of buying unnecessary, getting speeding tickets and aggression. Patient reported history of side effects due to polypharmacy.   Past Medical History:  Past Medical History:  Diagnosis Date  . Anxiety   . Asthma    as child, no problems now  .  Bipolar disorder [facility])    Has psychiatrist.  BH admission (suicidal) 07/20/11.  Marland Kitchen Cervical spondylosis    Primarily C4-5 and C5-6--referred to [facility] 02/2011.  . Depression   . GERD (gastroesophageal reflux disease)   . Hip pain, left 2011-2012   Left hip and groin: MRI pelvis and left hip 02/18/11 showed NORMAL hip, with small inguinal hernia containing fat and sigmoid diverticulosis.  Hip pain presumably referred pain from hernia/diverticular dz??.  . Hyperlipemia    Intol of meds: GI side effects  . Hypertension     no meds now  . Inguinal hernia 02/25/11   Left sided hernia with fat  . Left bundle branch block (LBBB) on electrocardiogram 10/29/2015   Rate related bundle branch block. Only noted once in February 2017.  Marland Kitchen Loss of hearing   . MRSA carrier    Intranasal bactroban tx 2011.  No hx of MRSA infection.  . Nephrolithiasis   . Overdose of benzodiazepine 04/30/2013   Status post  . Sleep apnea    states he has never had sleep study and only wife states he has OSA  . 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 ANKLE FRACTURE SURGERY     hardware still in both ankles (titanium)  . ANKLE SURGERY     Ankle tendon surgery  . [place]  Angiographically normal coronary arteries. LVH noted.  Marland Kitchen CARPOMETACARPEL SUSPENSION PLASTY Right 09/22/2016   Procedure: right thumb carpometacarpal  ARTHROPLASTY;  Surgeon: Leanora Cover, MD;  Location: [place];  Service: Orthopedics;  Laterality: Right;  right thumb carpometacarpal  ARTHROPLASTY  . ELBOW SURGERY     left---tendon  . INGUINAL HERNIA REPAIR     Right side with mesh about 1993  . OPEN REDUCTION INTERNAL FIXATION (ORIF) DISTAL RADIAL FRACTURE Right 10/29/2015   Procedure: OPEN REDUCTION INTERNAL FIXATION (ORIF) DISTAL RADIAL FRACTURE;  Surgeon: Leanora Cover, MD;  Location: [place];  Service: Orthopedics;  Laterality: Right;  . TONSILLECTOMY    . TOTAL KNEE ARTHROPLASTY     Right and left (titanium)  . TRANSTHORACIC ECHOCARDIOGRAM  06/24/2016   Normal LV function EF 60-65%. Grade 1 diastolic dysfunction. Otherwise essentially normal.  . ULNAR COLLATERAL LIGAMENT REPAIR Right 12/29/2015   Procedure: REPAIR  RIGHT LATERAL ULNAR COLLATERAL LIGAMENT TEAR  EXTENSOR ORIGIN ;  Surgeon: Leanora Cover, MD;  Location: [place];  Service: Orthopedics;  Laterality: Right;  . WRIST ARTHROSCOPY WITH DEBRIDEMENT Right 07/14/2016   Procedure: RIGHT WRIST ARTHROSCOPY WITH DEBRIDEMENT  TRIANGULAR FIBROCARTILAGE COMPLEX;  Surgeon: Leanora Cover, MD;  Location: [place];  Service: Orthopedics;  Laterality: Right;    Family Psychiatric History: Reviewed.  Family History:  Family History  Problem Relation Age of Onset  . Alcohol abuse Mother   . Ovarian cancer Mother   . Alcohol abuse Father   . Pancreatic cancer Father   . Depression Daughter   . Paranoid behavior Daughter   . Hyperlipidemia Brother     Social History:  Social History   Social History  . Marital status: Married    Spouse name: N/A  . Number of children: N/A  . Years of education: N/A   Social History Main Topics  . Smoking status: Never Smoker  . Smokeless tobacco: Never Used  . Alcohol use No     Comment: One drink rarely  . Drug use: No  . Sexual activity: Not Currently    Birth control/ protection: None     Comment: Same partner for 45 years   Other Topics Concern  . Not on file   Social History Narrative   Married, lives in [place].  Retired Dance movement psychotherapist.   Two daughters, both married,3 grandchildren ( 42, 57, 1 year)    No regular exercise.  Never smoker.  No ETOH.  No drugs.    Allergies:  Allergies  Allergen Reactions  . Codeine Shortness Of Breath  . Penicillins Hives, Rash and Other (See Comments)    Watery blisters Has patient  had a PCN reaction causing immediate rash, facial/tongue/throat swelling, SOB or lightheadedness with hypotension: No Has patient had a PCN reaction causing severe rash involving mucus membranes or skin necrosis: No Has patient had a PCN reaction that required hospitalization No Has patient had a PCN reaction occurring within the last 10 years: No If all of the above answers are "NO", then may proceed with Cephalosporin use.     Metabolic Disorder Labs: Lab Results  Component Value Date   HGBA1C 6.1 08/23/2016   MPG 123 (H) 12/03/2014   No results found for: PROLACTIN Lab Results  Component Value Date   CHOL 166 08/18/2016   TRIG (H) 08/18/2016    667.0 Triglyceride is over 400; calculations on Lipids are invalid.   HDL 32.30 (L) 08/18/2016   CHOLHDL 5 08/18/2016  VLDL 46.8 (H) 10/11/2013   Lab Results  Component Value Date   TSH 2.09 08/18/2016   TSH 2.188 12/03/2014    Therapeutic Level Labs: Lab Results  Component Value Date   LITHIUM 0.15 (L) 12/04/2012   LITHIUM 0.40 (L) 11/14/2012   No results found for: VALPROATE No components found for:  CBMZ  Current Medications: Current Outpatient Prescriptions  Medication Sig Dispense Refill  . ARIPiprazole ER 400 MG SRER Inject 400 mg into the muscle every 30 (thirty) days. 1 each 2  . aspirin 81 MG tablet Take 81 mg by mouth daily.    Marland Kitchen atorvastatin (LIPITOR) 40 MG tablet TAKE 1 TABLET (40 MG TOTAL) BY MOUTH DAILY. 90 tablet 3  . benztropine (COGENTIN) 1 MG tablet Take 1 tablet (1 mg total) by mouth daily. 90 tablet 0  . doxycycline (VIBRAMYCIN) 100 MG capsule Take 1 capsule (100 mg total) by mouth 2 (two) times daily. 14 capsule 0  . escitalopram (LEXAPRO) 20 MG tablet Take 1 tablet (20 mg total) by mouth daily. 90 tablet 0  . fenofibrate (TRICOR) 145 MG tablet Take 1 tablet (145 mg total) by mouth daily. 90 tablet 3  . Flaxseed, Linseed, (FLAXSEED OIL PO) Take 1 tablet by mouth daily.    . fluticasone (FLONASE) 50  MCG/ACT nasal spray Place 2 sprays into both nostrils daily. 16 g 6  . lisinopril (PRINIVIL,ZESTRIL) 20 MG tablet TAKE 1 TABLET (20 MG TOTAL) BY MOUTH DAILY. 90 tablet 3  . Multiple Vitamin (MULTIVITAMIN WITH MINERALS) TABS tablet Take 1 tablet by mouth daily.    Marland Kitchen omeprazole (PRILOSEC) 40 MG capsule TAKE ONE CAPSULE BY MOUTH TWICE DAILY 60 capsule 5  . oxymetazoline (AFRIN) 0.05 % nasal spray Place 1 spray into both nostrils 2 (two) times daily as needed for congestion.     . temazepam (RESTORIL) 15 MG capsule Take 1 capsule (15 mg total) by mouth at bedtime as needed. for sleep 90 capsule 0  . traZODone (DESYREL) 100 MG tablet Take 1 tablet (100 mg total) by mouth at bedtime. 90 tablet 0  . Vitamin D, Cholecalciferol, 1000 UNITS TABS Take 1 tablet by mouth daily.     Current Facility-Administered Medications  Medication Dose Route Frequency Provider Last Rate Last Dose  . ARIPiprazole ER PRSY 400 mg  400 mg Intramuscular Q28 days [name], MD   400 mg at 05/24/17 0918  . ipratropium-albuterol (DUONEB) 0.5-2.5 (3) MG/3ML nebulizer solution 3 mL  3 mL Nebulization Once Nafziger, Tommi Rumps, NP         Musculoskeletal: Strength & Muscle Tone: within normal limits Gait & Station: normal Patient leans: N/A  Psychiatric Specialty Exam: ROS  Blood pressure 128/80, pulse 72, height 5' 9.5" (1.765 m), weight 260 lb (117.9 kg).There is no height or weight on file to calculate BMI.  General Appearance: Casual  Eye Contact:  Good  Speech:  Clear and Coherent  Volume:  Normal  Mood:  Euthymic  Affect:  Appropriate  Thought Process:  Goal Directed  Orientation:  Full (Time, Place, and Person)  Thought Content: Logical   Suicidal Thoughts:  No  Homicidal Thoughts:  No  Memory:  Immediate;   Fair Recent;   Fair Remote;   Fair  Judgement:  Good  Insight:  Good  Psychomotor Activity:  Normal  Concentration:  Concentration: Good and Attention Span: Good  Recall:  Good  Fund of Knowledge:  Good  Language: Good  Akathisia:  No  Handed:  Right  AIMS (if indicated): not done  Assets:  Communication Skills Desire for Improvement Housing Resilience Social Support  ADL's:  Intact  Cognition: WNL  Sleep:  Good   Screenings: AUDIT     Admission (Discharged) from OP Visit from 12/04/2012 in [place] [facility]  Alcohol Use Disorder Identification Test Final Score (AUDIT)  1    PHQ2-9     Clinical Support from 02/23/2015 in [facility] Office Visit from 01/02/2015 in [facility] from 12/02/2013 in [place] [facility] Total Score  1  0  4       Assessment and Plan: Bipolar disorder type I.  Patient is a stable on his current medication.  He is concerned about his memory which he believes worsening since he's taking the Lexapro.  However he is also concerned if he stopped Lexapro than his symptoms get worse.  I recommended to try Lexapro 10 mg and also recommended to take half tablet trazodone as it may be causing memory impairment due to anticholinergic side effects.  He will continue temazepam 15 mg at bedtime, Cogentin 1 mg at bedtime and Abilify 400 mg intramuscular every 4 week.  He does not want to reduce Cogentin because it is helping his tremors.  Recommended to call us back if he has any question or any concern.  He will continue counseling with Sean Hill.  I will see him again in 3 months.   Bauer Ausborn T., MD 06/20/2017, 3:52 PM

## 2017-06-23 DIAGNOSIS — R69 Illness, unspecified: Secondary | ICD-10-CM | POA: Diagnosis not present

## 2017-06-23 DIAGNOSIS — F411 Generalized anxiety disorder: Secondary | ICD-10-CM | POA: Diagnosis not present

## 2017-07-14 DIAGNOSIS — M19131 Post-traumatic osteoarthritis, right wrist: Secondary | ICD-10-CM | POA: Diagnosis not present

## 2017-07-14 DIAGNOSIS — M1811 Unilateral primary osteoarthritis of first carpometacarpal joint, right hand: Secondary | ICD-10-CM | POA: Diagnosis not present

## 2017-07-21 DIAGNOSIS — R69 Illness, unspecified: Secondary | ICD-10-CM | POA: Diagnosis not present

## 2017-07-21 DIAGNOSIS — F411 Generalized anxiety disorder: Secondary | ICD-10-CM | POA: Diagnosis not present

## 2017-07-24 ENCOUNTER — Ambulatory Visit (INDEPENDENT_AMBULATORY_CARE_PROVIDER_SITE_OTHER): Payer: Medicare HMO

## 2017-07-24 DIAGNOSIS — F3131 Bipolar disorder, current episode depressed, mild: Secondary | ICD-10-CM

## 2017-07-24 DIAGNOSIS — R69 Illness, unspecified: Secondary | ICD-10-CM | POA: Diagnosis not present

## 2017-07-24 DIAGNOSIS — F319 Bipolar disorder, unspecified: Secondary | ICD-10-CM

## 2017-07-24 NOTE — Progress Notes (Signed)
Patient arrived with appropriate mood and affect for his injection of Abilify Maintena 400 mg. Patient presents in a good mood, reports no side effects, no SI/HI and no visual or audio hallucinations. The Abilify Maintena 400 mg injection was prepared as ordered and given to patient in his upper leftgluteal quadrant. Patient tolerated the procedure well and without complaint and will be back in 30 days. Patient will call if he develops any symptoms or has any questions.

## 2017-07-26 DIAGNOSIS — H524 Presbyopia: Secondary | ICD-10-CM | POA: Diagnosis not present

## 2017-07-26 DIAGNOSIS — H2513 Age-related nuclear cataract, bilateral: Secondary | ICD-10-CM | POA: Diagnosis not present

## 2017-07-26 DIAGNOSIS — H25013 Cortical age-related cataract, bilateral: Secondary | ICD-10-CM | POA: Diagnosis not present

## 2017-07-30 ENCOUNTER — Other Ambulatory Visit: Payer: Self-pay | Admitting: Adult Health

## 2017-08-01 DIAGNOSIS — R69 Illness, unspecified: Secondary | ICD-10-CM | POA: Diagnosis not present

## 2017-08-01 NOTE — Telephone Encounter (Signed)
Sent to the pharmacy by e-scribe for 30 days. 

## 2017-08-25 ENCOUNTER — Ambulatory Visit (INDEPENDENT_AMBULATORY_CARE_PROVIDER_SITE_OTHER): Payer: Medicare HMO

## 2017-08-25 ENCOUNTER — Other Ambulatory Visit: Payer: Self-pay | Admitting: Adult Health

## 2017-08-25 DIAGNOSIS — R69 Illness, unspecified: Secondary | ICD-10-CM | POA: Diagnosis not present

## 2017-08-25 DIAGNOSIS — M654 Radial styloid tenosynovitis [de Quervain]: Secondary | ICD-10-CM | POA: Diagnosis not present

## 2017-08-25 DIAGNOSIS — F3131 Bipolar disorder, current episode depressed, mild: Secondary | ICD-10-CM | POA: Diagnosis not present

## 2017-08-25 DIAGNOSIS — M19031 Primary osteoarthritis, right wrist: Secondary | ICD-10-CM | POA: Diagnosis not present

## 2017-08-25 DIAGNOSIS — E7849 Other hyperlipidemia: Secondary | ICD-10-CM

## 2017-08-25 DIAGNOSIS — F319 Bipolar disorder, unspecified: Secondary | ICD-10-CM

## 2017-08-25 NOTE — Progress Notes (Signed)
Pt came in for his abilify 400mg  injection. Pt was given a sample  Lot # [id number] exp  Dec 2020 it was given in the rt gluteus. Pt tolerated well.

## 2017-08-25 NOTE — Telephone Encounter (Signed)
Pt now due for cpx and lab work. Left a message to call back.  Needs to be scheduled.  CRM created.

## 2017-08-30 NOTE — Telephone Encounter (Signed)
Sent to the pharmacy by e-scribe.  Spoke to the pt and scheduled him for cpx on 09/21/17.

## 2017-08-31 ENCOUNTER — Other Ambulatory Visit: Payer: Self-pay

## 2017-09-03 ENCOUNTER — Other Ambulatory Visit: Payer: Self-pay | Admitting: Adult Health

## 2017-09-04 NOTE — Telephone Encounter (Signed)
FILLED ON 08/01/17.  DUPLICATE REQUEST.

## 2017-09-06 ENCOUNTER — Other Ambulatory Visit (HOSPITAL_COMMUNITY): Payer: Self-pay | Admitting: Psychiatry

## 2017-09-06 DIAGNOSIS — F319 Bipolar disorder, unspecified: Secondary | ICD-10-CM

## 2017-09-07 ENCOUNTER — Other Ambulatory Visit: Payer: Self-pay | Admitting: Adult Health

## 2017-09-07 NOTE — Telephone Encounter (Signed)
Sent to the pharmacy by e-scribe.  Pt has upcoming appt on 09/21/17.

## 2017-09-08 ENCOUNTER — Other Ambulatory Visit (HOSPITAL_COMMUNITY): Payer: Self-pay | Admitting: Psychiatry

## 2017-09-09 ENCOUNTER — Other Ambulatory Visit (HOSPITAL_COMMUNITY): Payer: Self-pay | Admitting: Psychiatry

## 2017-09-09 DIAGNOSIS — F319 Bipolar disorder, unspecified: Secondary | ICD-10-CM

## 2017-09-14 DIAGNOSIS — G43B1 Ophthalmoplegic migraine, intractable: Secondary | ICD-10-CM | POA: Diagnosis not present

## 2017-09-14 DIAGNOSIS — H2513 Age-related nuclear cataract, bilateral: Secondary | ICD-10-CM | POA: Diagnosis not present

## 2017-09-14 DIAGNOSIS — H25013 Cortical age-related cataract, bilateral: Secondary | ICD-10-CM | POA: Diagnosis not present

## 2017-09-18 DIAGNOSIS — H31422 Serous choroidal detachment, left eye: Secondary | ICD-10-CM | POA: Diagnosis not present

## 2017-09-18 DIAGNOSIS — H2513 Age-related nuclear cataract, bilateral: Secondary | ICD-10-CM | POA: Diagnosis not present

## 2017-09-18 DIAGNOSIS — H25013 Cortical age-related cataract, bilateral: Secondary | ICD-10-CM | POA: Diagnosis not present

## 2017-09-21 ENCOUNTER — Telehealth (HOSPITAL_COMMUNITY): Payer: Self-pay

## 2017-09-21 ENCOUNTER — Ambulatory Visit (HOSPITAL_COMMUNITY): Payer: Self-pay | Admitting: Psychiatry

## 2017-09-21 ENCOUNTER — Encounter: Payer: Self-pay | Admitting: Adult Health

## 2017-09-21 ENCOUNTER — Ambulatory Visit (INDEPENDENT_AMBULATORY_CARE_PROVIDER_SITE_OTHER): Payer: Medicare HMO | Admitting: Adult Health

## 2017-09-21 VITALS — BP 130/60 | HR 66 | Temp 98.0°F | Ht 68.5 in | Wt 260.8 lb

## 2017-09-21 DIAGNOSIS — Z125 Encounter for screening for malignant neoplasm of prostate: Secondary | ICD-10-CM | POA: Diagnosis not present

## 2017-09-21 DIAGNOSIS — E7849 Other hyperlipidemia: Secondary | ICD-10-CM

## 2017-09-21 DIAGNOSIS — I10 Essential (primary) hypertension: Secondary | ICD-10-CM

## 2017-09-21 DIAGNOSIS — Z0000 Encounter for general adult medical examination without abnormal findings: Secondary | ICD-10-CM | POA: Diagnosis not present

## 2017-09-21 DIAGNOSIS — K219 Gastro-esophageal reflux disease without esophagitis: Secondary | ICD-10-CM | POA: Diagnosis not present

## 2017-09-21 LAB — HEPATIC FUNCTION PANEL
ALBUMIN: 4.2 g/dL (ref 3.5–5.2)
ALT: 18 U/L (ref 0–53)
AST: 22 U/L (ref 0–37)
Alkaline Phosphatase: 55 U/L (ref 39–117)
Bilirubin, Direct: 0.1 mg/dL (ref 0.0–0.3)
TOTAL PROTEIN: 6.4 g/dL (ref 6.0–8.3)
Total Bilirubin: 0.6 mg/dL (ref 0.2–1.2)

## 2017-09-21 LAB — BASIC METABOLIC PANEL
BUN: 15 mg/dL (ref 6–23)
CALCIUM: 9.5 mg/dL (ref 8.4–10.5)
CO2: 29 meq/L (ref 19–32)
Chloride: 103 mEq/L (ref 96–112)
Creatinine, Ser: 1.13 mg/dL (ref 0.40–1.50)
GFR: 67.28 mL/min (ref >60.00)
GLUCOSE: 90 mg/dL (ref 70–99)
Potassium: 4.5 mEq/L (ref 3.5–5.1)
Sodium: 139 mEq/L (ref 135–145)

## 2017-09-21 LAB — LIPID PANEL
CHOLESTEROL: 133 mg/dL (ref 0–200)
HDL: 30.8 mg/dL — AB (ref >39.00)
NonHDL: 102.12
TRIGLYCERIDES: 276 mg/dL — AB (ref 0.0–149.0)
Total CHOL/HDL Ratio: 4
VLDL: 55.2 mg/dL — ABNORMAL HIGH (ref 0.0–40.0)

## 2017-09-21 LAB — CBC WITH DIFFERENTIAL/PLATELET
BASOS ABS: 0.1 10*3/uL (ref 0.0–0.1)
Basophils Relative: 1 % (ref 0.0–3.0)
EOS ABS: 0.1 10*3/uL (ref 0.0–0.7)
Eosinophils Relative: 2.5 % (ref 0.0–5.0)
HCT: 45 % (ref 39.0–52.0)
Hemoglobin: 14.8 g/dL (ref 13.0–17.0)
LYMPHS ABS: 1.9 10*3/uL (ref 0.7–4.0)
LYMPHS PCT: 33.2 % (ref 12.0–46.0)
MCHC: 32.9 g/dL (ref 30.0–36.0)
MCV: 88.2 fl (ref 78.0–100.0)
MONO ABS: 0.4 10*3/uL (ref 0.1–1.0)
Monocytes Relative: 7.4 % (ref 3.0–12.0)
NEUTROS ABS: 3.2 10*3/uL (ref 1.4–7.7)
NEUTROS PCT: 55.9 % (ref 43.0–77.0)
PLATELETS: 233 10*3/uL (ref 150.0–400.0)
RBC: 5.09 Mil/uL (ref 4.22–5.81)
RDW: 14.5 % (ref 11.5–15.5)
WBC: 5.7 10*3/uL (ref 4.0–10.5)

## 2017-09-21 LAB — PSA: PSA: 0.22 ng/mL (ref 0.10–4.00)

## 2017-09-21 LAB — HEMOGLOBIN A1C: Hgb A1c MFr Bld: 6.1 % (ref 4.6–6.5)

## 2017-09-21 LAB — TSH: TSH: 2.3 u[IU]/mL (ref 0.35–4.50)

## 2017-09-21 LAB — LDL CHOLESTEROL, DIRECT: LDL DIRECT: 79 mg/dL

## 2017-09-21 MED ORDER — OMEPRAZOLE 40 MG PO CPDR
40.0000 mg | DELAYED_RELEASE_CAPSULE | Freq: Two times a day (BID) | ORAL | 3 refills | Status: DC
Start: 2017-09-21 — End: 2017-10-26

## 2017-09-21 MED ORDER — FENOFIBRATE 145 MG PO TABS: 145.0000 mg | ORAL_TABLET | Freq: Every day | ORAL | 3 refills | Status: DC

## 2017-09-21 NOTE — Patient Instructions (Signed)
It was great seeing you today   Cologuard will be sent to your house   I will follow up with you on the blood work   Please work on weight loss through diet and exercise   Health Maintenance, Male A healthy lifestyle and preventative care can promote health and wellness.  Maintain regular health, dental, and eye exams.  Eat a healthy diet. Foods like vegetables, fruits, whole grains, low-fat dairy products, and lean protein foods contain the nutrients you need and are low in calories. Decrease your intake of foods high in solid fats, added sugars, and salt. Get information about a proper diet from your health care provider, if necessary.  Regular physical exercise is one of the most important things you can do for your health. Most adults should get at least 150 minutes of moderate-intensity exercise (any activity that increases your heart rate and causes you to sweat) each week. In addition, most adults need muscle-strengthening exercises on 2 or more days a week.   Maintain a healthy weight. The body mass index (BMI) is a screening tool to identify possible weight problems. It provides an estimate of body fat based on height and weight. Your health care provider can find your BMI and can help you achieve or maintain a healthy weight. For males 20 years and older:  A BMI below 18.5 is considered underweight.  A BMI of 18.5 to 24.9 is normal.  A BMI of 25 to 29.9 is considered overweight.  A BMI of 30 and above is considered obese.  Maintain normal blood lipids and cholesterol by exercising and minimizing your intake of saturated fat. Eat a balanced diet with plenty of fruits and vegetables. Blood tests for lipids and cholesterol should begin at age 4 and be repeated every 5 years. If your lipid or cholesterol levels are high, you are over age 63, or you are at high risk for heart disease, you may need your cholesterol levels checked more frequently.Ongoing high lipid and cholesterol  levels should be treated with medicines if diet and exercise are not working.  If you smoke, find out from your health care provider how to quit. If you do not use tobacco, do not start.  Lung cancer screening is recommended for adults aged 106-80 years who are at high risk for developing lung cancer because of a history of smoking. A yearly low-dose CT scan of the lungs is recommended for people who have at least a 30-pack-year history of smoking and are current smokers or have quit within the past 15 years. A pack year of smoking is smoking an average of 1 pack of cigarettes a day for 1 year (for example, a 30-pack-year history of smoking could mean smoking 1 pack a day for 30 years or 2 packs a day for 15 years). Yearly screening should continue until the smoker has stopped smoking for at least 15 years. Yearly screening should be stopped for people who develop a health problem that would prevent them from having lung cancer treatment.  If you choose to drink alcohol, do not have more than 2 drinks per day. One drink is considered to be 12 oz (360 mL) of beer, 5 oz (150 mL) of wine, or 1.5 oz (45 mL) of liquor.  Avoid the use of street drugs. Do not share needles with anyone. Ask for help if you need support or instructions about stopping the use of drugs.  High blood pressure causes heart disease and increases the risk of  stroke. High blood pressure is more likely to develop in:  People who have blood pressure in the end of the normal range (100-139/85-89 mm Hg).  People who are overweight or obese.  People who are African American.  If you are 52-34 years of age, have your blood pressure checked every 3-5 years. If you are 46 years of age or older, have your blood pressure checked every year. You should have your blood pressure measured twice--once when you are at a hospital or clinic, and once when you are not at a hospital or clinic. Record the average of the two measurements. To check your  blood pressure when you are not at a hospital or clinic, you can use:  An automated blood pressure machine at a pharmacy.  A home blood pressure monitor.  If you are 28-47 years old, ask your health care provider if you should take aspirin to prevent heart disease.  Diabetes screening involves taking a blood sample to check your fasting blood sugar level. This should be done once every 3 years after age 54 if you are at a normal weight and without risk factors for diabetes. Testing should be considered at a younger age or be carried out more frequently if you are overweight and have at least 1 risk factor for diabetes.  Colorectal cancer can be detected and often prevented. Most routine colorectal cancer screening begins at the age of 50 and continues through age 85. However, your health care provider may recommend screening at an earlier age if you have risk factors for colon cancer. On a yearly basis, your health care provider may provide home test kits to check for hidden blood in the stool. A small camera at the end of a tube may be used to directly examine the colon (sigmoidoscopy or colonoscopy) to detect the earliest forms of colorectal cancer. Talk to your health care provider about this at age 9 when routine screening begins. A direct exam of the colon should be repeated every 5-10 years through age 38, unless early forms of precancerous polyps or small growths are found.  People who are at an increased risk for hepatitis B should be screened for this virus. You are considered at high risk for hepatitis B if:  You were born in a country where hepatitis B occurs often. Talk with your health care provider about which countries are considered high risk.  Your parents were born in a high-risk country and you have not received a shot to protect against hepatitis B (hepatitis B vaccine).  You have HIV or AIDS.  You use needles to inject street drugs.  You live with, or have sex with,  someone who has hepatitis B.  You are a man who has sex with other men (MSM).  You get hemodialysis treatment.  You take certain medicines for conditions like cancer, organ transplantation, and autoimmune conditions.  Hepatitis C blood testing is recommended for all people born from 1 through 1963 and any individual with known risk factors for hepatitis C.  Healthy men should no longer receive prostate-specific antigen (PSA) blood tests as part of routine cancer screening. Talk to your health care provider about prostate cancer screening.  Testicular cancer screening is not recommended for adolescents or adult males who have no symptoms. Screening includes self-exam, a health care provider exam, and other screening tests. Consult with your health care provider about any symptoms you have or any concerns you have about testicular cancer.  Practice safe sex. Use  condoms and avoid high-risk sexual practices to reduce the spread of sexually transmitted infections (STIs).  You should be screened for STIs, including gonorrhea and chlamydia if:  You are sexually active and are younger than 24 years.  You are older than 24 years, and your health care provider tells you that you are at risk for this type of infection.  Your sexual activity has changed since you were last screened, and you are at an increased risk for chlamydia or gonorrhea. Ask your health care provider if you are at risk.  If you are at risk of being infected with HIV, it is recommended that you take a prescription medicine daily to prevent HIV infection. This is called pre-exposure prophylaxis (PrEP). You are considered at risk if:  You are a man who has sex with other men (MSM).  You are a heterosexual man who is sexually active with multiple partners.  You take drugs by injection.  You are sexually active with a partner who has HIV.  Talk with your health care provider about whether you are at high risk of being  infected with HIV. If you choose to begin PrEP, you should first be tested for HIV. You should then be tested every 3 months for as long as you are taking PrEP.  Use sunscreen. Apply sunscreen liberally and repeatedly throughout the day. You should seek shade when your shadow is shorter than you. Protect yourself by wearing long sleeves, pants, a wide-brimmed hat, and sunglasses year round whenever you are outdoors.  Tell your health care provider of new moles or changes in moles, especially if there is a change in shape or color. Also, tell your health care provider if a mole is larger than the size of a pencil eraser.  A one-time screening for abdominal aortic aneurysm (AAA) and surgical repair of large AAAs by ultrasound is recommended for men aged 73-75 years who are current or former smokers.  Stay current with your vaccines (immunizations).   This information is not intended to replace advice given to you by your health care provider. Make sure you discuss any questions you have with your health care provider.   Document Released: 03/03/2008 Document Revised: 09/26/2014 Document Reviewed: 01/31/2011 Elsevier Interactive Patient Education [employer].

## 2017-09-21 NOTE — Progress Notes (Signed)
Subjective:    Patient ID: Sean Hill, male    DOB: 08/05/1941, 77 y.o.   MRN: [id number]  HPI  Patient presents for yearly preventative medicine examination. He is a pleasant 77 year old male who  has a past medical history of Anxiety, Asthma, Bipolar disorder ([place]), Cervical spondylosis, Depression, GERD (gastroesophageal reflux disease), Hip pain, left (2011-2012), Hyperlipemia, Hypertension, Inguinal hernia (02/25/11), Left bundle branch block (LBBB) on electrocardiogram (10/29/2015), Loss of hearing, MRSA carrier, Nephrolithiasis, Overdose of benzodiazepine (04/30/2013), Sleep apnea, and Tinnitus.  He has a history of hyperlipidemia for which he takes lipitor and tricor as well as a baby aspirin   He is followed by psychiatry ( Dr. Renaldo Fiddler) for history of depression and bipolar. He is currently maintained on Cogentin, Lexapro, Trazodone, and Abilify injections. Today in the office he reports that his mood is stable. He denies any hallucinations, depression, suicidal ideation, or irritability   For insomnia he takes Restoril.    He takes lisinopril 20 mg for hypertension   All immunizations and health maintenance protocols were reviewed with the patient and needed orders were placed.  Appropriate screening laboratory values were ordered for the patient including screening of hyperlipidemia, renal function and hepatic function. If indicated by BPH, a PSA was ordered.  Medication reconciliation,  past medical history, social history, problem list and allergies were reviewed in detail with the patient  Goals were established with regard to weight loss, exercise, and  diet in compliance with medications. He does not exercise on regular basis. He does not follow a specific diet.   Wt Readings from Last 3 Encounters:  09/21/17 260 lb 12.8 oz (118.3 kg)  06/20/17 260 lb (117.9 kg)  03/23/17 248 lb (112.5 kg)   End of life planning was discussed.  He is due for a colonoscopy,  but he refuses to do a colonoscopy. He is ok with doing colo guard.   He reports that he is currently being treated by his eye doctor for a " ulcer or blister on my cornea".   Review of Systems  Constitutional: Negative.   HENT: Positive for hearing loss.   Eyes: Positive for visual disturbance.  Respiratory: Negative.   Cardiovascular: Negative.   Gastrointestinal: Negative.   Endocrine: Negative.   Genitourinary: Negative.   Musculoskeletal: Positive for arthralgias and back pain.  Skin: Negative.   Allergic/Immunologic: Negative.   Neurological: Negative.   Hematological: Negative.   Psychiatric/Behavioral: Negative.   All other systems reviewed and are negative.  Past Medical History:  Diagnosis Date  . Anxiety   . Asthma    as child, no problems now  . Bipolar disorder [facility])    Has psychiatrist.  BH admission (suicidal) 07/20/11.  Marland Kitchen Cervical spondylosis    Primarily C4-5 and C5-6--referred to [facility] 02/2011.  . Depression   . GERD (gastroesophageal reflux disease)   . Hip pain, left 2011-2012   Left hip and groin: MRI pelvis and left hip 02/18/11 showed NORMAL hip, with small inguinal hernia containing fat and sigmoid diverticulosis.  Hip pain presumably referred pain from hernia/diverticular dz??.  . Hyperlipemia    Intol of meds: GI side effects  . Hypertension     no meds now  . Inguinal hernia 02/25/11   Left sided hernia with fat  . Left bundle branch block (LBBB) on electrocardiogram 10/29/2015   Rate related bundle branch block. Only noted once in February 2017.  Marland Kitchen Loss of hearing   .  MRSA carrier    Intranasal bactroban tx 2011.  No hx of MRSA infection.  . Nephrolithiasis   . Overdose of benzodiazepine 04/30/2013   Status post  . Sleep apnea    states he has never had sleep study and only wife states he has OSA  . 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 Marital status: Married    Spouse name: Not on file  . Number of children: Not on file  . Years of education: Not on file  . Highest education level: Not on file  Social Needs  . Financial resource strain: Not on file  . Food insecurity - worry: Not on file  . Food insecurity - inability: Not on file  . Transportation needs - medical: Not on file  . Transportation needs - non-medical: Not on file  Occupational History  . Not on file  Tobacco Use  . Smoking status: Never Smoker  . Smokeless tobacco: Never Used  Substance and Sexual Activity  . Alcohol use: No    Alcohol/week: 0.0 oz    Comment: One drink rarely  . Drug use: No  . Sexual activity: Not Currently    Birth control/protection: None    Comment: Same partner for 45 years  Other Topics Concern  . Not on file  Social History Narrative   Married, lives in [place].  Retired Dance movement psychotherapist.   Two daughters, both married,3 grandchildren ( 36, 90, 1 year)    No regular exercise.  Never smoker.  No ETOH.  No drugs.    Past Surgical History:  Procedure Laterality Date  . ANKLE FRACTURE SURGERY     hardware still in both ankles (titanium)  . ANKLE SURGERY     Ankle tendon surgery  . CARDIAC CATHETERIZATION  1983   Angiographically normal coronary arteries. LVH noted.  Marland Kitchen CARPOMETACARPEL SUSPENSION PLASTY Right 09/22/2016   Procedure: right thumb carpometacarpal  ARTHROPLASTY;  Surgeon: Leanora Cover, MD;  Location: [place];  Service: Orthopedics;  Laterality: Right;  right thumb carpometacarpal  ARTHROPLASTY  . ELBOW SURGERY     left---tendon  . INGUINAL HERNIA REPAIR     Right side with mesh about 1993  . OPEN REDUCTION INTERNAL FIXATION (ORIF) DISTAL RADIAL FRACTURE Right 10/29/2015   Procedure: OPEN REDUCTION INTERNAL FIXATION (ORIF) DISTAL RADIAL FRACTURE;  Surgeon: Leanora Cover, MD;  Location: [place];  Service: Orthopedics;  Laterality: Right;  .  TONSILLECTOMY    . TOTAL KNEE ARTHROPLASTY     Right and left (titanium)  . TRANSTHORACIC ECHOCARDIOGRAM  06/24/2016   Normal LV function EF 60-65%. Grade 1 diastolic dysfunction. Otherwise essentially normal.  . ULNAR COLLATERAL LIGAMENT REPAIR Right 12/29/2015   Procedure: REPAIR  RIGHT LATERAL ULNAR COLLATERAL LIGAMENT TEAR  EXTENSOR ORIGIN ;  Surgeon: Leanora Cover, MD;  Location: [place];  Service: Orthopedics;  Laterality: Right;  . WRIST ARTHROSCOPY WITH DEBRIDEMENT Right 07/14/2016   Procedure: RIGHT WRIST ARTHROSCOPY WITH DEBRIDEMENT  TRIANGULAR FIBROCARTILAGE COMPLEX;  Surgeon: Leanora Cover, MD;  Location: [place];  Service: Orthopedics;  Laterality: Right;    Family History  Problem Relation Age of Onset  . Alcohol abuse Mother   . Ovarian cancer Mother   . Alcohol abuse Father   . Pancreatic cancer Father   . Depression Daughter   . Paranoid behavior Daughter   . Hyperlipidemia Brother  Allergies  Allergen Reactions  . Codeine Shortness Of Breath  . Penicillins Hives, Rash and Other (See Comments)    Watery blisters Has patient had a PCN reaction causing immediate rash, facial/tongue/throat swelling, SOB or lightheadedness with hypotension: No Has patient had a PCN reaction causing severe rash involving mucus membranes or skin necrosis: No Has patient had a PCN reaction that required hospitalization No Has patient had a PCN reaction occurring within the last 10 years: No If all of the above answers are "NO", then may proceed with Cephalosporin use.     Current Outpatient Medications on File Prior to Visit  Medication Sig Dispense Refill  . ARIPiprazole ER 400 MG SRER Inject 400 mg into the muscle every 30 (thirty) days. 1 each 2  . aspirin 81 MG tablet Take 81 mg by mouth daily.    Marland Kitchen atorvastatin (LIPITOR) 40 MG tablet TAKE 1 TABLET (40 MG TOTAL) BY MOUTH DAILY. 90 tablet 3  . benztropine (COGENTIN) 1 MG tablet Take 1 tablet (1 mg  total) by mouth daily. 90 tablet 0  . escitalopram (LEXAPRO) 10 MG tablet Take 1 tablet (10 mg total) by mouth daily. 90 tablet 0  . fenofibrate (TRICOR) 145 MG tablet TAKE 1 TABLET (145 MG TOTAL) BY MOUTH DAILY. 90 tablet 0  . Flaxseed, Linseed, (FLAXSEED OIL PO) Take 1 tablet by mouth daily.    . fluticasone (FLONASE) 50 MCG/ACT nasal spray Place 2 sprays into both nostrils daily. 16 g 6  . lisinopril (PRINIVIL,ZESTRIL) 20 MG tablet TAKE 1 TABLET (20 MG TOTAL) BY MOUTH DAILY. 90 tablet 3  . Multiple Vitamin (MULTIVITAMIN WITH MINERALS) TABS tablet Take 1 tablet by mouth daily.    Marland Kitchen omeprazole (PRILOSEC) 40 MG capsule TAKE 1 CAPSULE BY MOUTH TWICE A DAY 60 capsule 0  . oxymetazoline (AFRIN) 0.05 % nasal spray Place 1 spray into both nostrils 2 (two) times daily as needed for congestion.     . temazepam (RESTORIL) 15 MG capsule Take 1 capsule (15 mg total) by mouth at bedtime as needed. for sleep 90 capsule 0  . traZODone (DESYREL) 100 MG tablet Take 1/2 to 1 tab at bed time 90 tablet 0  . Vitamin D, Cholecalciferol, 1000 UNITS TABS Take 1 tablet by mouth daily.     Current Facility-Administered Medications on File Prior to Visit  Medication Dose Route Frequency Provider Last Rate Last Dose  . ARIPiprazole ER PRSY 400 mg  400 mg Intramuscular Q28 days [name], MD   400 mg at 08/25/17 1159  . ipratropium-albuterol (DUONEB) 0.5-2.5 (3) MG/3ML nebulizer solution 3 mL  3 mL Nebulization Once Vaness Jelinski, NP        BP 130/60 (BP Location: Left Arm, Patient Position: Sitting, Cuff Size: Large)   Pulse 66   Temp 98 F (36.7 C) (Oral)   Ht 5' 8.5" (1.74 m)   Wt 260 lb 12.8 oz (118.3 kg)   SpO2 94%   BMI 39.08 kg/m       Objective:   Physical Exam  Constitutional: He is oriented to person, place, and time. He appears well-developed and well-nourished. No distress.  HENT:  Head: Normocephalic and atraumatic.  Right Ear: External ear normal.  Left Ear: External ear normal.  Nose:  Nose normal.  Mouth/Throat: Oropharynx is clear and moist. No oropharyngeal exudate.  Eyes: Conjunctivae and EOM are normal. Pupils are equal, round, and reactive to light. Right eye exhibits no discharge. Left eye exhibits no discharge. No scleral  icterus.  Neck: Trachea normal and normal range of motion. Neck supple. No JVD present. No tracheal tenderness present. Carotid bruit is not present. No tracheal deviation present. No thyroid mass and no thyromegaly present.  Cardiovascular: Normal rate, regular rhythm, normal heart sounds and intact distal pulses. Exam reveals no gallop and no friction rub.  No murmur heard. Pulmonary/Chest: Effort normal and breath sounds normal. No stridor. No respiratory distress. He has no wheezes. He has no rales. He exhibits no tenderness.  Abdominal: Soft. Bowel sounds are normal. He exhibits no distension and no mass. There is no tenderness. There is no rebound and no guarding.  Obese   Genitourinary:  Genitourinary Comments: Deferred: will do PSA   Musculoskeletal: Normal range of motion. He exhibits no edema, tenderness or deformity.  Lymphadenopathy:    He has no cervical adenopathy.  Neurological: He is alert and oriented to person, place, and time. He has normal reflexes. He displays normal reflexes. No cranial nerve deficit. He exhibits normal muscle tone. Coordination normal.  Skin: Skin is warm and dry. No rash noted. He is not diaphoretic. No erythema. No pallor.  Psychiatric: He has a normal mood and affect. His behavior is normal. Judgment and thought content normal.  Nursing note and vitals reviewed.     Assessment & Plan:  1. Routine general medical examination at a health care facility - Follow up in one year or sooner if needed - Will order cologuard for colon cancer screen.  - Encouraged weight loss through diet and exercise  - Basic metabolic panel - CBC with Differential/Platelet - Hemoglobin A1c - Hepatic function panel - Lipid  panel - TSH - PSA  2. Other hyperlipidemia - educated on the important of diet and exercise  - Basic metabolic panel - CBC with Differential/Platelet - Hemoglobin A1c - Hepatic function panel - Lipid panel - TSH - PSA - fenofibrate (TRICOR) 145 MG tablet; Take 1 tablet (145 mg total) by mouth daily.  Dispense: 90 tablet; Refill: 3  3. Essential hypertension - Controlled. Continue current dose  - Basic metabolic panel - CBC with Differential/Platelet - Hemoglobin A1c - Hepatic function panel - Lipid panel - TSH - PSA  4. Gastroesophageal reflux disease, esophagitis presence not specified azole (PRILOSEC) 40 MG capsule; Take 1 capsule (40 mg total) by mouth 2 (two) times daily.  Dispense: 90 capsule; Refill: 3   [name], NP

## 2017-09-21 NOTE — Telephone Encounter (Signed)
Medication change request - Fax received from pt's pharmacy per patient request to change Escitlopram 10 mg (cost $126) to Citalopram 20 mg ($0 cost) or Sertraline 50 mg ($0 cost).  Patient returns for eval 10/12/17.

## 2017-09-22 ENCOUNTER — Other Ambulatory Visit (HOSPITAL_COMMUNITY): Payer: Self-pay | Admitting: Psychiatry

## 2017-09-22 DIAGNOSIS — H2513 Age-related nuclear cataract, bilateral: Secondary | ICD-10-CM | POA: Diagnosis not present

## 2017-09-22 DIAGNOSIS — H353221 Exudative age-related macular degeneration, left eye, with active choroidal neovascularization: Secondary | ICD-10-CM | POA: Diagnosis not present

## 2017-09-22 DIAGNOSIS — H353111 Nonexudative age-related macular degeneration, right eye, early dry stage: Secondary | ICD-10-CM | POA: Diagnosis not present

## 2017-09-22 DIAGNOSIS — F319 Bipolar disorder, unspecified: Secondary | ICD-10-CM

## 2017-09-22 DIAGNOSIS — H3530 Unspecified macular degeneration: Secondary | ICD-10-CM

## 2017-09-22 HISTORY — DX: Unspecified macular degeneration: H35.30

## 2017-09-25 ENCOUNTER — Ambulatory Visit (INDEPENDENT_AMBULATORY_CARE_PROVIDER_SITE_OTHER): Payer: Medicare HMO

## 2017-09-25 ENCOUNTER — Encounter (HOSPITAL_COMMUNITY): Payer: Self-pay

## 2017-09-25 VITALS — BP 122/64 | HR 81 | Ht 69.0 in | Wt 260.0 lb

## 2017-09-25 DIAGNOSIS — F319 Bipolar disorder, unspecified: Secondary | ICD-10-CM | POA: Diagnosis not present

## 2017-09-25 DIAGNOSIS — R69 Illness, unspecified: Secondary | ICD-10-CM | POA: Diagnosis not present

## 2017-09-25 MED ORDER — ARIPIPRAZOLE ER 400 MG IM SRER: 400.0000 mg | INTRAMUSCULAR | 2 refills | Status: DC

## 2017-09-25 NOTE — Telephone Encounter (Signed)
He can try Celexa 20 mg instead of Lexapro 10 mg

## 2017-09-25 NOTE — Progress Notes (Signed)
Patient in today for due Abilify Maintena 400 mg IM injection.  Patient denied any auditory or visual hallucinations, no suicidal or homicidal ideations and reports mood has been good with no current concerns.  Patient remarked about how well Abilify Jodi Geralds is doing for him and keeping mood level.  Patient did reported recently and newly diagnosed issue with macular degeneration in his left eye and started monthly injections into the eye this past Friday on 09/22/17 from his optometrist.  Patient's due Abilify Maintena 400 mg IM injection prepared as ordered and given to patient in his right upper outer gluteal area.  Patient tolerated due injection without complaint of pain or discomfort and agreed to return in 30 days for next due injection.  Patient to call if any problems prior to then and has next appointment with Dr. Adele Schilder 10/12/17.

## 2017-09-26 MED ORDER — CITALOPRAM HYDROBROMIDE 20 MG PO TABS: 20.0000 mg | ORAL_TABLET | Freq: Every day | ORAL | 0 refills | Status: DC

## 2017-09-26 NOTE — Telephone Encounter (Signed)
Sent in the order for Celexa and called patient to let him know, patient is agreeable to this plan. He also informed me that he received a bill for his injection, I got the information from him and I will call the billing department.

## 2017-09-27 DIAGNOSIS — F411 Generalized anxiety disorder: Secondary | ICD-10-CM | POA: Diagnosis not present

## 2017-09-27 DIAGNOSIS — R69 Illness, unspecified: Secondary | ICD-10-CM | POA: Diagnosis not present

## 2017-09-28 ENCOUNTER — Other Ambulatory Visit: Payer: Self-pay | Admitting: Adult Health

## 2017-09-28 ENCOUNTER — Other Ambulatory Visit (HOSPITAL_COMMUNITY): Payer: Self-pay | Admitting: Psychiatry

## 2017-09-28 DIAGNOSIS — I10 Essential (primary) hypertension: Secondary | ICD-10-CM

## 2017-09-28 NOTE — Telephone Encounter (Signed)
No longer taking Lexapro.  It was switched to Celexa

## 2017-10-03 NOTE — Telephone Encounter (Signed)
Sent to the pharmacy by e-scribe.  Pt due for cpx 09/2018

## 2017-10-06 DIAGNOSIS — M19031 Primary osteoarthritis, right wrist: Secondary | ICD-10-CM | POA: Diagnosis not present

## 2017-10-10 ENCOUNTER — Encounter: Payer: Self-pay | Admitting: Family Medicine

## 2017-10-11 ENCOUNTER — Other Ambulatory Visit: Payer: Self-pay | Admitting: Orthopedic Surgery

## 2017-10-12 ENCOUNTER — Ambulatory Visit (HOSPITAL_COMMUNITY): Payer: Self-pay | Admitting: Psychiatry

## 2017-10-20 DIAGNOSIS — H353221 Exudative age-related macular degeneration, left eye, with active choroidal neovascularization: Secondary | ICD-10-CM | POA: Diagnosis not present

## 2017-10-20 DIAGNOSIS — H353111 Nonexudative age-related macular degeneration, right eye, early dry stage: Secondary | ICD-10-CM | POA: Diagnosis not present

## 2017-10-20 DIAGNOSIS — H2513 Age-related nuclear cataract, bilateral: Secondary | ICD-10-CM | POA: Diagnosis not present

## 2017-10-25 ENCOUNTER — Ambulatory Visit (INDEPENDENT_AMBULATORY_CARE_PROVIDER_SITE_OTHER): Payer: Medicare HMO

## 2017-10-25 DIAGNOSIS — F3131 Bipolar disorder, current episode depressed, mild: Secondary | ICD-10-CM

## 2017-10-25 DIAGNOSIS — F411 Generalized anxiety disorder: Secondary | ICD-10-CM | POA: Diagnosis not present

## 2017-10-25 DIAGNOSIS — R69 Illness, unspecified: Secondary | ICD-10-CM | POA: Diagnosis not present

## 2017-10-25 NOTE — Progress Notes (Signed)
Patient in today for due Abilify Maintena 400 mg IM injection.  Patient denied any auditory or visual hallucinations, no suicidal or homicidal ideations and reports mood has been good with no current concerns. Patient's due Abilify Maintena 400 mg IM injection prepared as ordered and given to patient in his left upper outer gluteal area.  Patient tolerated due injection without complaint of pain or discomfort and agreed to return in 30 days for next due injection.  Patient to call if any problems prior to then.

## 2017-10-26 ENCOUNTER — Encounter (HOSPITAL_BASED_OUTPATIENT_CLINIC_OR_DEPARTMENT_OTHER)
Admission: RE | Admit: 2017-10-26 | Discharge: 2017-10-26 | Disposition: A | Discharge status: Home / Self Care | Payer: Medicare HMO | Source: Ambulatory Visit | Attending: Orthopedic Surgery | Admitting: Orthopedic Surgery

## 2017-10-26 ENCOUNTER — Encounter (HOSPITAL_BASED_OUTPATIENT_CLINIC_OR_DEPARTMENT_OTHER): Payer: Self-pay | Admitting: *Deleted

## 2017-10-26 ENCOUNTER — Other Ambulatory Visit: Payer: Self-pay

## 2017-10-26 DIAGNOSIS — Z01818 Encounter for other preprocedural examination: Secondary | ICD-10-CM | POA: Insufficient documentation

## 2017-10-26 DIAGNOSIS — Z01810 Encounter for preprocedural cardiovascular examination: Secondary | ICD-10-CM | POA: Diagnosis not present

## 2017-10-26 NOTE — Progress Notes (Signed)
   10/26/17 0915  OBSTRUCTIVE SLEEP APNEA  Have you ever been diagnosed with sleep apnea through a sleep study? No  Do you snore loudly (loud enough to be heard through closed doors)?  0  Do you often feel tired, fatigued, or sleepy during the daytime (such as falling asleep during driving or talking to someone)? 0  Has anyone observed you stop breathing during your sleep? 0  Do you have, or are you being treated for high blood pressure? 1  BMI more than 35 kg/m2? 1  Age > 28 (1-yes) 1  Male Gender (Yes=1) 1  Obstructive Sleep Apnea Score 4  Score 5 or greater  Results sent to PCP [name])

## 2017-10-26 NOTE — Progress Notes (Signed)
EKG and Echo ( 06/28/16)reviewed by Dr. Valma Cava, will proceed with surgery as scheduled.

## 2017-10-31 ENCOUNTER — Ambulatory Visit (INDEPENDENT_AMBULATORY_CARE_PROVIDER_SITE_OTHER): Payer: Medicare HMO | Admitting: Psychiatry

## 2017-10-31 ENCOUNTER — Encounter (HOSPITAL_COMMUNITY): Payer: Self-pay | Admitting: Psychiatry

## 2017-10-31 VITALS — BP 138/72 | HR 72 | Ht 69.0 in | Wt 260.4 lb

## 2017-10-31 DIAGNOSIS — Z818 Family history of other mental and behavioral disorders: Secondary | ICD-10-CM | POA: Diagnosis not present

## 2017-10-31 DIAGNOSIS — H3530 Unspecified macular degeneration: Secondary | ICD-10-CM | POA: Diagnosis not present

## 2017-10-31 DIAGNOSIS — Z915 Personal history of self-harm: Secondary | ICD-10-CM | POA: Diagnosis not present

## 2017-10-31 DIAGNOSIS — Z79899 Other long term (current) drug therapy: Secondary | ICD-10-CM | POA: Diagnosis not present

## 2017-10-31 DIAGNOSIS — F319 Bipolar disorder, unspecified: Secondary | ICD-10-CM

## 2017-10-31 DIAGNOSIS — Z811 Family history of alcohol abuse and dependence: Secondary | ICD-10-CM | POA: Diagnosis not present

## 2017-10-31 DIAGNOSIS — R69 Illness, unspecified: Secondary | ICD-10-CM | POA: Diagnosis not present

## 2017-10-31 DIAGNOSIS — F419 Anxiety disorder, unspecified: Secondary | ICD-10-CM

## 2017-10-31 MED ORDER — TRAZODONE HCL 100 MG PO TABS: ORAL_TABLET | ORAL | 0 refills | Status: DC

## 2017-10-31 MED ORDER — BENZTROPINE MESYLATE 1 MG PO TABS: 1.0000 mg | ORAL_TABLET | Freq: Every day | ORAL | 0 refills | Status: DC

## 2017-10-31 MED ORDER — CITALOPRAM HYDROBROMIDE 20 MG PO TABS: 20.0000 mg | ORAL_TABLET | Freq: Every day | ORAL | 0 refills | Status: DC

## 2017-10-31 MED ORDER — TEMAZEPAM 15 MG PO CAPS: 15.0000 mg | ORAL_CAPSULE | Freq: Every evening | ORAL | 0 refills | Status: DC | PRN

## 2017-10-31 NOTE — Progress Notes (Signed)
BH MD/PA/NP OP Progress Note  10/31/2017 10:22 AM Sean Hill  MRN:  [id number]  Chief Complaint: I am doing fine.  I think switching Lexapro to Celexa help my memory.  HPI: Sean Hill came for his follow-up appointment.  He is taking all his medication and denies any side effects.  Recently we switch Lexapro to Celexa due to cost and he noticed improvement in his memory.  He is also taking over-the-counter focus factor pill for past 2 weeks.  He is not sure if over-the-counter helps or switching Lexapro to Celexa help his concentration and memory.  He was also diagnosed with macular degeneration and now he is getting monthly injection in his eyes from Dr. Baird Cancer.  He is handling his illness much better.  He denies any paranoia, hallucination, suicidal thoughts or homicidal thought.  He denies any irritability, anger or any crying spells.  He recently seen his primary care physician and he was told everything is good.  His energy level is good.  He has a good Christmas and holidays.  He lives with his wife who is supportive.  Patient continues to see [name] for CBT and supportive therapy.  Visit Diagnosis:    ICD-10-CM   1. Bipolar I disorder (HCC) F31.9 traZODone (DESYREL) 100 MG tablet    temazepam (RESTORIL) 15 MG capsule    citalopram (CELEXA) 20 MG tablet    benztropine (COGENTIN) 1 MG tablet    Past Psychiatric History: Reviewed. Patient has at least 4-5 psychiatric hospitalization. His last psychiatric admission was in July 2014 . He has history of taking overdose on his medication and mixing with alcohol. He has a history of cutting his wrist. He has taken Tegretol, Depakote, Ambien, Remeron, Vistaril , Geodon, Abilify, lithium, Provigil, Zoloft, Neurontin , Lamictal, Wellbutrin , Risperdal and Prstiq. He took overdose on [place]. Patient had history of mania and severe impulsive behavior. He admitted history of buying unnecessary, getting speeding tickets and aggression. Patient  reported history of side effects due to polypharmacy.   Past Medical History:  Past Medical History:  Diagnosis Date  . Anxiety   . Arthritis    right wrist; pt. states "everywhere"  . Bipolar disorder ([place])   . Depression   . GERD (gastroesophageal reflux disease)   . History of kidney stones   . 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 Hyperlipidemia   . Hypertension    states under control with med., has been on med. x 1 yr.  . Impaired hearing   . Left bundle branch block (LBBB) on electrocardiogram 10/29/2015  . Macular degeneration (senile) of retina    left    Past Surgical History:  Procedure Laterality Date  . ANKLE FUSION Bilateral   . ANKLE SURGERY Bilateral    ligament surgery  . CARDIAC CATHETERIZATION  x 2   1983; 11/14/2003  . CARPOMETACARPEL SUSPENSION PLASTY Right 09/22/2016   Procedure: right thumb carpometacarpal  ARTHROPLASTY;  Surgeon: Leanora Cover, MD;  Location: [facility];  Service: Orthopedics;  Laterality: Right;  right thumb carpometacarpal  ARTHROPLASTY  . ELBOW SURGERY Left   . INGUINAL HERNIA REPAIR Right   . OPEN REDUCTION INTERNAL FIXATION (ORIF) DISTAL RADIAL FRACTURE Right 10/29/2015   Procedure: OPEN REDUCTION INTERNAL FIXATION (ORIF) DISTAL RADIAL FRACTURE;  Surgeon: Leanora Cover, MD;  Location: [facility];  Service: Orthopedics;  Laterality: Right;  . ORIF ELBOW FRACTURE Right   . TONSILLECTOMY    . TOTAL  KNEE ARTHROPLASTY Bilateral   . ULNAR COLLATERAL LIGAMENT REPAIR Right 12/29/2015   Procedure: REPAIR  RIGHT LATERAL ULNAR COLLATERAL LIGAMENT TEAR  EXTENSOR ORIGIN ;  Surgeon: Leanora Cover, MD;  Location: [facility];  Service: Orthopedics;  Laterality: Right;  . WRIST ARTHROSCOPY WITH DEBRIDEMENT Right 07/14/2016   Procedure: RIGHT WRIST ARTHROSCOPY WITH DEBRIDEMENT  TRIANGULAR FIBROCARTILAGE COMPLEX;  Surgeon: Leanora Cover, MD;  Location: [facility];  Service:  Orthopedics;  Laterality: Right;    Family Psychiatric History: Reviewed.  Family History:  Family History  Problem Relation Age of Onset  . Alcohol abuse Mother   . Ovarian cancer Mother   . Alcohol abuse Father   . Pancreatic cancer Father   . Depression Daughter   . Paranoid behavior Daughter   . Hyperlipidemia Brother     Social History:  Social History   Socioeconomic History  . Marital status: Married    Spouse name: None  . Number of children: None  . Years of education: None  . Highest education level: None  Social Needs  . Financial resource strain: None  . Food insecurity - worry: None  . Food insecurity - inability: None  . Transportation needs - medical: None  . Transportation needs - non-medical: None  Occupational History  . None  Tobacco Use  . Smoking status: Never Smoker  . Smokeless tobacco: Never Used  Substance and Sexual Activity  . Alcohol use: Yes    Comment: occasionally  . Drug use: No  . Sexual activity: Not Currently    Birth control/protection: None  Other Topics Concern  . None  Social History Narrative   Married, lives in [place].  Retired Dance movement psychotherapist.   Two daughters, both married,3 grandchildren ( 16, 41, 1 year)    No regular exercise.  Never smoker.  No ETOH.  No drugs.    Allergies:  Allergies  Allergen Reactions  . Penicillins Hives    BLISTERS  . Codeine Rash    Metabolic Disorder Labs: Lab Results  Component Value Date   HGBA1C 6.1 09/21/2017   MPG 123 (H) 12/03/2014   No results found for: PROLACTIN Lab Results  Component Value Date   CHOL 133 09/21/2017   TRIG 276.0 (H) 09/21/2017   HDL 30.80 (L) 09/21/2017   CHOLHDL 4 09/21/2017   VLDL 55.2 (H) 09/21/2017   Lab Results  Component Value Date   TSH 2.30 09/21/2017   TSH 2.09 08/18/2016    Therapeutic Level Labs: Lab Results  Component Value Date   LITHIUM 0.15 (L) 12/04/2012   LITHIUM 0.40 (L) 11/14/2012   No results found for:  VALPROATE No components found for:  CBMZ  Current Medications: Current Outpatient Medications  Medication Sig Dispense Refill  . ARIPiprazole ER 400 MG SRER Inject 400 mg into the muscle every 30 (thirty) days. 1 each 2  . atorvastatin (LIPITOR) 40 MG tablet TAKE 1 TABLET (40 MG TOTAL) BY MOUTH DAILY. 90 tablet 3  . benztropine (COGENTIN) 1 MG tablet TAKE 1 TABLET BY MOUTH EVERY DAY 90 tablet 0  . citalopram (CELEXA) 20 MG tablet Take 1 tablet (20 mg total) by mouth daily. 30 tablet 0  . fenofibrate (TRICOR) 145 MG tablet Take 1 tablet (145 mg total) by mouth daily. 90 tablet 3  . lisinopril (PRINIVIL,ZESTRIL) 20 MG tablet TAKE 1 TABLET (20 MG TOTAL) BY MOUTH DAILY. 90 tablet 3  . Multiple Vitamin (MULTIVITAMIN WITH MINERALS) TABS tablet Take 1 tablet by mouth daily.    Marland Kitchen  Omega-3 Fatty Acids (FISH OIL PO) Take by mouth.    Marland Kitchen omeprazole (PRILOSEC) 40 MG capsule Take 40 mg by mouth daily.    . temazepam (RESTORIL) 15 MG capsule Take 1 capsule (15 mg total) by mouth at bedtime as needed. for sleep 90 capsule 0  . traZODone (DESYREL) 100 MG tablet Take 1/2 to 1 tab at bed time 90 tablet 0  . Vitamin D, Cholecalciferol, 1000 UNITS TABS Take 1 tablet by mouth daily.    Marland Kitchen aspirin 81 MG tablet Take 81 mg by mouth daily.     Current Facility-Administered Medications  Medication Dose Route Frequency Provider Last Rate Last Dose  . ipratropium-albuterol (DUONEB) 0.5-2.5 (3) MG/3ML nebulizer solution 3 mL  3 mL Nebulization Once Nafziger, Tommi Rumps, NP         Musculoskeletal: Strength & Muscle Tone: within normal limits Gait & Station: normal Patient leans: N/A  Psychiatric Specialty Exam: ROS  Blood pressure 138/72, pulse 72, height 5\' 9"  (1.753 m), weight 260 lb 6.4 oz (118.1 kg).Body mass index is 38.45 kg/m.  General Appearance: Casual  Eye Contact:  Good  Speech:  Clear and Coherent  Volume:  Normal  Mood:  Euthymic  Affect:  Appropriate  Thought Process:  Goal Directed  Orientation:   Full (Time, Place, and Person)  Thought Content: Logical   Suicidal Thoughts:  No  Homicidal Thoughts:  No  Memory:  Immediate;   Fair Recent;   Fair Remote;   Fair  Judgement:  Good  Insight:  Good  Psychomotor Activity:  Tremor  Concentration:  Concentration: Fair and Attention Span: Fair  Recall:  [place] of Knowledge: Good  Language: Good  Akathisia:  No  Handed:  Right  AIMS (if indicated): not done  Assets:  Communication Skills Desire for Improvement Housing Resilience Social Support  ADL's:  Intact  Cognition: WNL  Sleep:  Good   Screenings: AUDIT     Admission (Discharged) from OP Visit from 12/04/2012 in [facility]  Alcohol Use Disorder Identification Test Final Score (AUDIT)  1    PHQ2-9     Office Visit from 09/21/2017 in [facility] [place] from 02/23/2015 in [facility] Office Visit from 01/02/2015 in [facility] from 12/02/2013 in [facility]  PHQ-2 Total Score  0  1  0  4       Assessment and Plan: Bipolar disorder type I.  Anxiety disorder NOS.  Patient doing better on his current medication.  He is no longer taking Lexapro.  He is now taking Celexa 20 mg.  Discussed medication side effects and benefits.  Continue trazodone 100 mg half to 1 tablet at bedtime, Cogentin 1 mg at bedtime, Celexa 20 mg daily, temazepam 15 mg at bedtime and Abilify injection 400 mg intramuscular every 4 weeks.  Discussed polypharmacy.  Encouraged to continue counseling with [facility].  Recommended to call us back if he has any question or any concern.  Follow-up in 3 months.   [name], MD 10/31/2017, 10:22 AM

## 2017-11-01 NOTE — Anesthesia Preprocedure Evaluation (Signed)
Anesthesia Evaluation  Patient identified by MRN, date of birth, ID band Patient awake    Reviewed: Allergy & Precautions, NPO status   Airway Mallampati: II  TM Distance: >3 FB Neck ROM: Full    Dental no notable dental hx.    Pulmonary asthma , sleep apnea ,    Pulmonary exam normal breath sounds clear to auscultation       Cardiovascular hypertension, Normal cardiovascular exam Rhythm:Regular Rate:Normal     Neuro/Psych    GI/Hepatic Neg liver ROS, GERD  ,  Endo/Other    Renal/GU Renal disease     Musculoskeletal  (+) Arthritis ,   Abdominal Normal abdominal exam  (+)   Peds  Hematology   Anesthesia Other Findings   Reproductive/Obstetrics                             Anesthesia Physical  Anesthesia Plan  ASA: III  Anesthesia Plan: General   Post-op Pain Management:  Regional for Post-op pain   Induction: Intravenous  PONV Risk Score and Plan: 1 and Ondansetron and Treatment may vary due to age or medical condition  Airway Management Planned: LMA  Additional Equipment:   Intra-op Plan:   Post-operative Plan:   Informed Consent: I have reviewed the patients History and Physical, chart, labs and discussed the procedure including the risks, benefits and alternatives for the proposed anesthesia with the patient or authorized representative who has indicated his/her understanding and acceptance.   Dental advisory given  Plan Discussed with: CRNA and Anesthesiologist  Anesthesia Plan Comments:         Anesthesia Quick Evaluation

## 2017-11-02 ENCOUNTER — Encounter (HOSPITAL_BASED_OUTPATIENT_CLINIC_OR_DEPARTMENT_OTHER)
Admission: RE | Disposition: A | Discharge status: Home / Self Care | Payer: Self-pay | Source: Ambulatory Visit | Attending: Orthopedic Surgery

## 2017-11-02 ENCOUNTER — Ambulatory Visit (HOSPITAL_BASED_OUTPATIENT_CLINIC_OR_DEPARTMENT_OTHER)
Admission: RE | Admit: 2017-11-02 | Discharge: 2017-11-02 | Disposition: A | Discharge status: Home / Self Care | Payer: Medicare HMO | Source: Ambulatory Visit | Attending: Orthopedic Surgery | Admitting: Orthopedic Surgery

## 2017-11-02 ENCOUNTER — Ambulatory Visit (HOSPITAL_BASED_OUTPATIENT_CLINIC_OR_DEPARTMENT_OTHER): Payer: Medicare HMO | Admitting: Anesthesiology

## 2017-11-02 ENCOUNTER — Encounter (HOSPITAL_BASED_OUTPATIENT_CLINIC_OR_DEPARTMENT_OTHER): Payer: Self-pay | Admitting: *Deleted

## 2017-11-02 DIAGNOSIS — Z7982 Long term (current) use of aspirin: Secondary | ICD-10-CM | POA: Diagnosis not present

## 2017-11-02 DIAGNOSIS — E785 Hyperlipidemia, unspecified: Secondary | ICD-10-CM | POA: Insufficient documentation

## 2017-11-02 DIAGNOSIS — G4730 Sleep apnea, unspecified: Secondary | ICD-10-CM | POA: Diagnosis not present

## 2017-11-02 DIAGNOSIS — Z79899 Other long term (current) drug therapy: Secondary | ICD-10-CM | POA: Diagnosis not present

## 2017-11-02 DIAGNOSIS — M19031 Primary osteoarthritis, right wrist: Secondary | ICD-10-CM | POA: Insufficient documentation

## 2017-11-02 DIAGNOSIS — I447 Left bundle-branch block, unspecified: Secondary | ICD-10-CM | POA: Insufficient documentation

## 2017-11-02 DIAGNOSIS — Z96691 Finger-joint replacement of right hand: Secondary | ICD-10-CM | POA: Diagnosis not present

## 2017-11-02 DIAGNOSIS — Z9889 Other specified postprocedural states: Secondary | ICD-10-CM | POA: Diagnosis not present

## 2017-11-02 DIAGNOSIS — F329 Major depressive disorder, single episode, unspecified: Secondary | ICD-10-CM | POA: Diagnosis not present

## 2017-11-02 DIAGNOSIS — I10 Essential (primary) hypertension: Secondary | ICD-10-CM | POA: Insufficient documentation

## 2017-11-02 DIAGNOSIS — M25541 Pain in joints of right hand: Secondary | ICD-10-CM | POA: Diagnosis present

## 2017-11-02 DIAGNOSIS — H9190 Unspecified hearing loss, unspecified ear: Secondary | ICD-10-CM | POA: Diagnosis not present

## 2017-11-02 DIAGNOSIS — Z8614 Personal history of Methicillin resistant Staphylococcus aureus infection: Secondary | ICD-10-CM | POA: Insufficient documentation

## 2017-11-02 DIAGNOSIS — K219 Gastro-esophageal reflux disease without esophagitis: Secondary | ICD-10-CM | POA: Diagnosis not present

## 2017-11-02 DIAGNOSIS — M19041 Primary osteoarthritis, right hand: Secondary | ICD-10-CM | POA: Diagnosis not present

## 2017-11-02 DIAGNOSIS — Z96653 Presence of artificial knee joint, bilateral: Secondary | ICD-10-CM | POA: Insufficient documentation

## 2017-11-02 DIAGNOSIS — R69 Illness, unspecified: Secondary | ICD-10-CM | POA: Diagnosis not present

## 2017-11-02 DIAGNOSIS — G8918 Other acute postprocedural pain: Secondary | ICD-10-CM | POA: Diagnosis not present

## 2017-11-02 HISTORY — DX: Unspecified osteoarthritis, unspecified site: M19.90

## 2017-11-02 HISTORY — PX: CARPOMETACARPEL SUSPENSION PLASTY: SHX5005

## 2017-11-02 HISTORY — DX: Personal history of Methicillin resistant Staphylococcus aureus infection: Z86.14

## 2017-11-02 HISTORY — DX: Hyperlipidemia, unspecified: E78.5

## 2017-11-02 HISTORY — DX: Reserved for inherently not codable concepts without codable children: IMO0001

## 2017-11-02 HISTORY — DX: Unspecified hearing loss, unspecified ear: H91.90

## 2017-11-02 HISTORY — DX: Personal history of urinary calculi: Z87.442

## 2017-11-02 SURGERY — CARPOMETACARPEL (CMC) SUSPENSION PLASTY
Anesthesia: General | Site: Hand | Laterality: Right

## 2017-11-02 MED ORDER — PROPOFOL 10 MG/ML IV BOLUS
INTRAVENOUS | Status: DC | PRN
  Administered 2017-11-02: 150 mg via INTRAVENOUS

## 2017-11-02 MED ORDER — LIDOCAINE 2% (20 MG/ML) 5 ML SYRINGE
INTRAMUSCULAR | Status: AC
  Filled 2017-11-02: qty 5

## 2017-11-02 MED ORDER — BUPIVACAINE HCL (PF) 0.75 % IJ SOLN
INTRAMUSCULAR | Status: DC | PRN
  Administered 2017-11-02: 25 mL via PERINEURAL

## 2017-11-02 MED ORDER — LACTATED RINGERS IV SOLN
INTRAVENOUS | Status: DC
  Administered 2017-11-02 (×2): via INTRAVENOUS

## 2017-11-02 MED ORDER — ONDANSETRON HCL 4 MG/2ML IJ SOLN
INTRAMUSCULAR | Status: DC | PRN
  Administered 2017-11-02: 4 mg via INTRAVENOUS

## 2017-11-02 MED ORDER — LIDOCAINE HCL (CARDIAC) 20 MG/ML IV SOLN
INTRAVENOUS | Status: DC | PRN
  Administered 2017-11-02: 100 mg via INTRAVENOUS

## 2017-11-02 MED ORDER — VANCOMYCIN HCL IN DEXTROSE 1-5 GM/200ML-% IV SOLN
INTRAVENOUS | Status: AC
  Filled 2017-11-02: qty 200

## 2017-11-02 MED ORDER — MIDAZOLAM HCL 2 MG/2ML IJ SOLN
1.0000 mg | INTRAMUSCULAR | Status: DC | PRN
  Administered 2017-11-02: 2 mg via INTRAVENOUS

## 2017-11-02 MED ORDER — MEPERIDINE HCL 25 MG/ML IJ SOLN: 6.2500 mg | INTRAMUSCULAR | Status: DC | PRN

## 2017-11-02 MED ORDER — FENTANYL CITRATE (PF) 100 MCG/2ML IJ SOLN
INTRAMUSCULAR | Status: AC
  Filled 2017-11-02: qty 2

## 2017-11-02 MED ORDER — OXYCODONE-ACETAMINOPHEN 5-325 MG PO TABS: ORAL_TABLET | ORAL | 0 refills | Status: DC

## 2017-11-02 MED ORDER — PROPOFOL 10 MG/ML IV BOLUS
INTRAVENOUS | Status: AC
  Filled 2017-11-02: qty 20

## 2017-11-02 MED ORDER — FENTANYL CITRATE (PF) 100 MCG/2ML IJ SOLN
50.0000 ug | INTRAMUSCULAR | Status: DC | PRN
  Administered 2017-11-02: 100 ug via INTRAVENOUS

## 2017-11-02 MED ORDER — FENTANYL CITRATE (PF) 100 MCG/2ML IJ SOLN: 25.0000 ug | INTRAMUSCULAR | Status: DC | PRN

## 2017-11-02 MED ORDER — MIDAZOLAM HCL 2 MG/2ML IJ SOLN
INTRAMUSCULAR | Status: AC
  Filled 2017-11-02: qty 2

## 2017-11-02 MED ORDER — VANCOMYCIN HCL IN DEXTROSE 1-5 GM/200ML-% IV SOLN
1000.0000 mg | INTRAVENOUS | Status: AC
  Administered 2017-11-02: 1000 mg via INTRAVENOUS

## 2017-11-02 MED ORDER — DEXAMETHASONE SODIUM PHOSPHATE 4 MG/ML IJ SOLN
INTRAMUSCULAR | Status: DC | PRN
  Administered 2017-11-02: 10 mg via INTRAVENOUS

## 2017-11-02 MED ORDER — ONDANSETRON HCL 4 MG/2ML IJ SOLN
INTRAMUSCULAR | Status: AC
Start: 2017-11-02 — End: ?
  Filled 2017-11-02: qty 4

## 2017-11-02 MED ORDER — DEXAMETHASONE SODIUM PHOSPHATE 10 MG/ML IJ SOLN
INTRAMUSCULAR | Status: AC
  Filled 2017-11-02: qty 1

## 2017-11-02 MED ORDER — CHLORHEXIDINE GLUCONATE 4 % EX LIQD: 60.0000 mL | Freq: Once | CUTANEOUS | Status: DC

## 2017-11-02 MED ORDER — SCOPOLAMINE 1 MG/3DAYS TD PT72: 1.0000 | MEDICATED_PATCH | Freq: Once | TRANSDERMAL | Status: DC | PRN

## 2017-11-02 SURGICAL SUPPLY — 74 items
BANDAGE ACE 3X5.8 VEL STRL LF (GAUZE/BANDAGES/DRESSINGS) IMPLANT
BLADE MINI RND TIP GREEN BEAV (BLADE) ×2 IMPLANT
BLADE SURG 15 STRL LF DISP TIS (BLADE) ×2 IMPLANT
BLADE SURG 15 STRL SS (BLADE) ×4
BNDG CMPR 9X4 STRL LF SNTH (GAUZE/BANDAGES/DRESSINGS) ×1
BNDG ELASTIC 2X5.8 VLCR STR LF (GAUZE/BANDAGES/DRESSINGS) IMPLANT
BNDG ESMARK 4X9 LF (GAUZE/BANDAGES/DRESSINGS) ×2 IMPLANT
BNDG GAUZE ELAST 4 BULKY (GAUZE/BANDAGES/DRESSINGS) ×2 IMPLANT
CHLORAPREP W/TINT 26ML (MISCELLANEOUS) ×2 IMPLANT
CORD BIPOLAR FORCEPS 12FT (ELECTRODE) ×2 IMPLANT
COVER BACK TABLE 60X90IN (DRAPES) ×2 IMPLANT
COVER MAYO STAND STRL (DRAPES) ×2 IMPLANT
CUFF TOURNIQUET SINGLE 18IN (TOURNIQUET CUFF) IMPLANT
DECANTER SPIKE VIAL GLASS SM (MISCELLANEOUS) IMPLANT
DRAPE EXTREMITY T 121X128X90 (DRAPE) ×2 IMPLANT
DRAPE OEC MINIVIEW 54X84 (DRAPES) ×2 IMPLANT
DRAPE SURG 17X23 STRL (DRAPES) ×2 IMPLANT
DRSG PAD ABDOMINAL 8X10 ST (GAUZE/BANDAGES/DRESSINGS) IMPLANT
GAUZE SPONGE 4X4 12PLY STRL (GAUZE/BANDAGES/DRESSINGS) ×2 IMPLANT
GAUZE XEROFORM 1X8 LF (GAUZE/BANDAGES/DRESSINGS) ×2 IMPLANT
GLOVE BIO SURGEON STRL SZ7.5 (GLOVE) ×3 IMPLANT
GLOVE BIOGEL PI IND STRL 7.0 (GLOVE) IMPLANT
GLOVE BIOGEL PI IND STRL 7.5 (GLOVE) IMPLANT
GLOVE BIOGEL PI IND STRL 8 (GLOVE) ×1 IMPLANT
GLOVE BIOGEL PI IND STRL 8.5 (GLOVE) ×1 IMPLANT
GLOVE BIOGEL PI INDICATOR 7.0 (GLOVE) ×1
GLOVE BIOGEL PI INDICATOR 7.5 (GLOVE) ×1
GLOVE BIOGEL PI INDICATOR 8 (GLOVE) ×1
GLOVE BIOGEL PI INDICATOR 8.5 (GLOVE) ×1
GLOVE ECLIPSE 6.5 STRL STRAW (GLOVE) ×2 IMPLANT
GLOVE SURG ORTHO 8.0 STRL STRW (GLOVE) ×1 IMPLANT
GOWN STRL REUS W/ TWL LRG LVL3 (GOWN DISPOSABLE) ×1 IMPLANT
GOWN STRL REUS W/ TWL XL LVL3 (GOWN DISPOSABLE) IMPLANT
GOWN STRL REUS W/TWL LRG LVL3 (GOWN DISPOSABLE) ×2
GOWN STRL REUS W/TWL XL LVL3 (GOWN DISPOSABLE) ×4 IMPLANT
K-WIRE .035X4 (WIRE) IMPLANT
NDL HYPO 25X1 1.5 SAFETY (NEEDLE) IMPLANT
NDL SAFETY ECLIPSE 18X1.5 (NEEDLE) IMPLANT
NEEDLE HYPO 18GX1.5 SHARP (NEEDLE)
NEEDLE HYPO 22GX1.5 SAFETY (NEEDLE) IMPLANT
NEEDLE HYPO 25X1 1.5 SAFETY (NEEDLE) IMPLANT
NEEDLE KEITH (NEEDLE) IMPLANT
NS IRRIG 1000ML POUR BTL (IV SOLUTION) ×2 IMPLANT
PACK BASIN DAY SURGERY FS (CUSTOM PROCEDURE TRAY) ×2 IMPLANT
PAD CAST 3X4 CTTN HI CHSV (CAST SUPPLIES) ×1 IMPLANT
PAD CAST 4YDX4 CTTN HI CHSV (CAST SUPPLIES) IMPLANT
PADDING CAST ABS 4INX4YD NS (CAST SUPPLIES) ×1
PADDING CAST ABS COTTON 4X4 ST (CAST SUPPLIES) ×1 IMPLANT
PADDING CAST COTTON 3X4 STRL (CAST SUPPLIES) ×2
PADDING CAST COTTON 4X4 STRL (CAST SUPPLIES)
PASSER SUT SWANSON 36MM LOOP (INSTRUMENTS) IMPLANT
SLEEVE SCD COMPRESS KNEE MED (MISCELLANEOUS) ×2 IMPLANT
SPLINT FAST PLASTER 5X30 (CAST SUPPLIES)
SPLINT PLASTER CAST FAST 5X30 (CAST SUPPLIES) IMPLANT
SPLINT PLASTER CAST XFAST 4X15 (CAST SUPPLIES) IMPLANT
SPLINT PLASTER XTRA FAST SET 4 (CAST SUPPLIES)
STOCKINETTE 4X48 STRL (DRAPES) ×2 IMPLANT
SUT ETHIBOND 3-0 V-5 (SUTURE) IMPLANT
SUT ETHILON 3 0 PS 1 (SUTURE) IMPLANT
SUT ETHILON 4 0 PS 2 18 (SUTURE) ×2 IMPLANT
SUT FIBERWIRE 2-0 18 17.9 3/8 (SUTURE) ×2
SUT MERSILENE 2.0 SH NDLE (SUTURE) IMPLANT
SUT MERSILENE 4 0 P 3 (SUTURE) IMPLANT
SUT SILK 4 0 PS 2 (SUTURE) IMPLANT
SUT STEEL 3 0 (SUTURE) ×1 IMPLANT
SUT VIC AB 0 SH 27 (SUTURE) IMPLANT
SUT VICRYL 4-0 PS2 18IN ABS (SUTURE) ×2 IMPLANT
SUTURE FIBERWR 2-0 18 17.9 3/8 (SUTURE) ×1 IMPLANT
SYR BULB 3OZ (MISCELLANEOUS) ×2 IMPLANT
SYR CONTROL 10ML LL (SYRINGE) IMPLANT
SYSTEM IMPLANT TIGHTROPE MINI (Anchor) ×2 IMPLANT
TOWEL OR 17X24 6PK STRL BLUE (TOWEL DISPOSABLE) ×4 IMPLANT
TOWEL OR NON WOVEN STRL DISP B (DISPOSABLE) ×2 IMPLANT
UNDERPAD 30X30 (UNDERPADS AND DIAPERS) ×1 IMPLANT

## 2017-11-02 NOTE — Discharge Instructions (Addendum)

## 2017-11-02 NOTE — Op Note (Signed)
I assisted Surgeon(s) and Role:    * Leanora Cover, MD - Primary    * Daryll Brod, MD - Assisting on the Procedure(s): RIGHT THUMB SUSPENSIONPLASTY WITH [place] on 11/02/2017.  I provided assistance on this case as follows: setup,approach, retraction, removal of the distal scaphoid, the suspension arthroplasty, closure of the wounds, and application of the dressing and splints.  Electronically signed by: Wynonia Sours, MD Date: 11/02/2017 Time: 11:04 AM

## 2017-11-02 NOTE — Progress Notes (Signed)
Assisted Dr. Oddono with right, ultrasound guided, supraclavicular block. Side rails up, monitors on throughout procedure. See vital signs in flow sheet. Tolerated Procedure well. 

## 2017-11-02 NOTE — Anesthesia Procedure Notes (Signed)
Anesthesia Regional Block: Supraclavicular block   Pre-Anesthetic Checklist: ,, timeout performed, Correct Patient, Correct Site, Correct Laterality, Correct Procedure, Correct Position, site marked, Risks and benefits discussed,  Surgical consent,  Pre-op evaluation,  At surgeon's request and post-op pain management  Laterality: Right  Prep: chloraprep       Needles:  Injection technique: Single-shot  Needle Type: Echogenic Stimulator Needle     Needle Length: 5cm  Needle Gauge: 22     Additional Needles:   Procedures:, nerve stimulator,,, ultrasound used (permanent image in chart),,,,  Narrative:  Start time: 11/02/2017 9:18 AM End time: 11/02/2017 9:22 AM Injection made incrementally with aspirations every 5 mL.  Performed by: Personally  Anesthesiologist: Janeece Riggers, MD  Additional Notes: Functioning IV was confirmed and monitors were applied.  A 34mm 22ga Arrow echogenic stimulator needle was used. Sterile prep and drape,hand hygiene and sterile gloves were used. Ultrasound guidance: relevant anatomy identified, needle position confirmed, local anesthetic spread visualized around nerve(s)., vascular puncture avoided.  Image printed for medical record. Negative aspiration and negative test dose prior to incremental administration of local anesthetic. The patient tolerated the procedure well.

## 2017-11-02 NOTE — Anesthesia Postprocedure Evaluation (Signed)
Anesthesia Post Note  Patient: Sean Hill  Procedure(s) Performed: RIGHT THUMB SUSPENSIONPLASTY WITH RIGHTROPE, ISTAL POLE SCAPHOID  EXCISION (Right Hand)     Patient location during evaluation: PACU Anesthesia Type: General Level of consciousness: awake and alert Pain management: pain level controlled Vital Signs Assessment: post-procedure vital signs reviewed and stable Respiratory status: spontaneous breathing, nonlabored ventilation, respiratory function stable and patient connected to nasal cannula oxygen Cardiovascular status: blood pressure returned to baseline and stable Postop Assessment: no apparent nausea or vomiting Anesthetic complications: no    Last Vitals:  Vitals:   11/02/17 1115 11/02/17 1130  BP: 136/71 103/75  Pulse: 97 91  Resp: 14 12  Temp:    SpO2: 92% 93%    Last Pain:  Vitals:   11/02/17 1130  TempSrc:   PainSc: 0-No pain                 Therasa Lorenzi

## 2017-11-02 NOTE — Brief Op Note (Signed)
11/02/2017  10:59 AM  PATIENT:  Sean Hill  77 y.o. male  PRE-OPERATIVE DIAGNOSIS:  RIGHT WRIST ARTHRITIS  POST-OPERATIVE DIAGNOSIS:  RIGHT WRIST ARTHRITIS  PROCEDURE:  Procedure(s): RIGHT THUMB SUSPENSIONPLASTY WITH RIGHTROPE, ISTAL POLE SCAPHOID  EXCISION (Right)  SURGEON:  Surgeon(s) and Role:    * Leanora Cover, MD - Primary    * Daryll Brod, MD - Assisting  PHYSICIAN ASSISTANT:   ASSISTANTS: Daryll Brod, MD   ANESTHESIA:   regional and general  EBL:  Minimal  BLOOD ADMINISTERED:none  DRAINS: none   LOCAL MEDICATIONS USED:  NONE  SPECIMEN:  No Specimen  DISPOSITION OF SPECIMEN:  N/A  COUNTS:  YES  TOURNIQUET:   Total Tourniquet Time Documented: Upper Arm (Right) - 55 minutes Total: Upper Arm (Right) - 55 minutes   DICTATION: .Other Dictation: Dictation Number [id number]  PLAN OF CARE: Discharge to home after PACU  PATIENT DISPOSITION:  PACU - hemodynamically stable.

## 2017-11-02 NOTE — Anesthesia Procedure Notes (Signed)
Procedure Name: LMA Insertion Date/Time: 11/02/2017 9:43 AM Performed by: Lyndee Leo, CRNA Pre-anesthesia Checklist: Patient identified, Emergency Drugs available, Suction available and Patient being monitored Patient Re-evaluated:Patient Re-evaluated prior to induction Oxygen Delivery Method: Circle system utilized Preoxygenation: Pre-oxygenation with 100% oxygen Induction Type: IV induction Ventilation: Mask ventilation without difficulty LMA: LMA inserted LMA Size: 4.0 Number of attempts: 1 Airway Equipment and Method: Bite block Placement Confirmation: positive ETCO2 Tube secured with: Tape Dental Injury: Teeth and Oropharynx as per pre-operative assessment

## 2017-11-02 NOTE — Transfer of Care (Signed)
Immediate Anesthesia Transfer of Care Note  Patient: Sean Hill  Procedure(s) Performed: RIGHT THUMB SUSPENSIONPLASTY WITH RIGHTROPE, ISTAL POLE SCAPHOID  EXCISION (Right Hand)  Patient Location: PACU  Anesthesia Type:General  Level of Consciousness: awake, sedated and patient cooperative  Airway & Oxygen Therapy: Patient Spontanous Breathing and Patient connected to face mask oxygen  Post-op Assessment: Report given to RN and Post -op Vital signs reviewed and stable  Post vital signs: Reviewed and stable  Last Vitals:  Vitals:   11/02/17 0920 11/02/17 0925  BP: (!) 151/81   Pulse: 92 99  Resp: 16 15  Temp:    SpO2: 95% 95%    Last Pain:  Vitals:   11/02/17 0851  TempSrc: Oral  PainSc: 0-No pain         Complications: No apparent anesthesia complications

## 2017-11-02 NOTE — Op Note (Signed)
[id number] 

## 2017-11-02 NOTE — H&P (Signed)
Sean Hill is an 77 y.o. male.   Chief Complaint: right thumb pain HPI: 77 yo male s/p suspensionplasty right thumb with continued pain at cmc level.  He has tried non operative treatments without lasting relief.  He wishes to undergo repeat suspensionplasty with tightrope augmentation and distal pole scaphoid excision.  Allergies:  Allergies  Allergen Reactions  . Penicillins Hives    BLISTERS  . Codeine Rash    Past Medical History:  Diagnosis Date  . Anxiety   . Arthritis    right wrist; pt. states "everywhere"  . Bipolar disorder ([place])   . Depression   . GERD (gastroesophageal reflux disease)   . History of kidney stones   . 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 Hyperlipidemia   . Hypertension    states under control with med., has been on med. x 1 yr.  . Impaired hearing   . Left bundle branch block (LBBB) on electrocardiogram 10/29/2015  . Macular degeneration (senile) of retina    left    Past Surgical History:  Procedure Laterality Date  . ANKLE FUSION Bilateral   . ANKLE SURGERY Bilateral    ligament surgery  . CARDIAC CATHETERIZATION  x 2   1983; 11/14/2003  . CARPOMETACARPEL SUSPENSION PLASTY Right 09/22/2016   Procedure: right thumb carpometacarpal  ARTHROPLASTY;  Surgeon: Leanora Cover, MD;  Location: [place];  Service: Orthopedics;  Laterality: Right;  right thumb carpometacarpal  ARTHROPLASTY  . ELBOW SURGERY Left   . INGUINAL HERNIA REPAIR Right   . OPEN REDUCTION INTERNAL FIXATION (ORIF) DISTAL RADIAL FRACTURE Right 10/29/2015   Procedure: OPEN REDUCTION INTERNAL FIXATION (ORIF) DISTAL RADIAL FRACTURE;  Surgeon: Leanora Cover, MD;  Location: [place] [facility];  Service: Orthopedics;  Laterality: Right;  . ORIF ELBOW FRACTURE Right   . TONSILLECTOMY    . TOTAL KNEE ARTHROPLASTY Bilateral   . ULNAR COLLATERAL LIGAMENT REPAIR Right 12/29/2015   Procedure: REPAIR  RIGHT LATERAL ULNAR COLLATERAL LIGAMENT  TEAR  EXTENSOR ORIGIN ;  Surgeon: Leanora Cover, MD;  Location: [place];  Service: Orthopedics;  Laterality: Right;  . WRIST ARTHROSCOPY WITH DEBRIDEMENT Right 07/14/2016   Procedure: RIGHT WRIST ARTHROSCOPY WITH DEBRIDEMENT  TRIANGULAR FIBROCARTILAGE COMPLEX;  Surgeon: Leanora Cover, MD;  Location: [place];  Service: Orthopedics;  Laterality: Right;    Family History: Family History  Problem Relation Age of Onset  . Alcohol abuse Mother   . Ovarian cancer Mother   . Alcohol abuse Father   . Pancreatic cancer Father   . Depression Daughter   . Paranoid behavior Daughter   . Hyperlipidemia Brother     Social History:   reports that  has never smoked. he has never used smokeless tobacco. He reports that he drinks alcohol. He reports that he does not use drugs.  Medications: Facility-Administered Medications Prior to Admission  Medication Dose Route Frequency Provider Last Rate Last Dose  . ipratropium-albuterol (DUONEB) 0.5-2.5 (3) MG/3ML nebulizer solution 3 mL  3 mL Nebulization Once [name], NP       Medications Prior to Admission  Medication Sig Dispense Refill  . ARIPiprazole ER 400 MG SRER Inject 400 mg into the muscle every 30 (thirty) days. 1 each 2  . aspirin 81 MG tablet Take 81 mg by mouth daily.    Marland Kitchen atorvastatin (LIPITOR) 40 MG tablet TAKE 1 TABLET (40 MG TOTAL) BY MOUTH DAILY. 90 tablet 3  . benztropine (  COGENTIN) 1 MG tablet Take 1 tablet (1 mg total) by mouth daily. 90 tablet 0  . citalopram (CELEXA) 20 MG tablet Take 1 tablet (20 mg total) by mouth daily. 30 tablet 0  . fenofibrate (TRICOR) 145 MG tablet Take 1 tablet (145 mg total) by mouth daily. 90 tablet 3  . lisinopril (PRINIVIL,ZESTRIL) 20 MG tablet TAKE 1 TABLET (20 MG TOTAL) BY MOUTH DAILY. 90 tablet 3  . Multiple Vitamin (MULTIVITAMIN WITH MINERALS) TABS tablet Take 1 tablet by mouth daily.    . Omega-3 Fatty Acids (FISH OIL PO) Take by mouth.    Marland Kitchen omeprazole  (PRILOSEC) 40 MG capsule Take 40 mg by mouth daily.    . temazepam (RESTORIL) 15 MG capsule Take 1 capsule (15 mg total) by mouth at bedtime as needed. for sleep 90 capsule 0  . traZODone (DESYREL) 100 MG tablet Take 1/2 to 1 tab at bed time 90 tablet 0  . Vitamin D, Cholecalciferol, 1000 UNITS TABS Take 1 tablet by mouth daily.      No results found for this or any previous visit (from the past 48 hour(s)).  No results found.   A comprehensive review of systems was negative.  Blood pressure 124/68, pulse 69, temperature 97.9 F (36.6 C), temperature source Oral, height 5\' 10"  (1.778 m), weight 117.1 kg (258 lb 4 oz).  General appearance: alert, cooperative and appears stated age Head: Normocephalic, without obvious abnormality, atraumatic Neck: supple, symmetrical, trachea midline Cardio: regular rate and rhythm Resp: clear to auscultation bilaterally Extremities: Intact sensation and capillary refill all digits.  +epl/fpl/io.  No wounds.  Pulses: 2+ and symmetric Skin: Skin color, texture, turgor normal. 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  Non operative and operative treatment options were discussed with the patient and patient wishes to proceed with operative treatment. Risks, benefits, and alternatives of surgery were discussed and the patient agrees with the plan of care.   Hilliard Borges R 11/02/2017, 9:16 AM

## 2017-11-03 ENCOUNTER — Encounter (HOSPITAL_BASED_OUTPATIENT_CLINIC_OR_DEPARTMENT_OTHER): Payer: Self-pay | Admitting: Orthopedic Surgery

## 2017-11-03 NOTE — Op Note (Signed)
NAME:  ARMOND, CUTHRELL                 ACCOUNT NO.:  MEDICAL RECORD NO.:  [id number]  LOCATION:                                 FACILITY:  PHYSICIAN:  Leanora Cover, MD        DATE OF BIRTH:  01-09-1941  DATE OF PROCEDURE:  11/02/2017 DATE OF DISCHARGE:                              OPERATIVE REPORT   PREOPERATIVE DIAGNOSIS:  Right scaphotrapeziotrapezoidal osteoarthritis.  POSTOPERATIVE DIAGNOSIS:  Right scaphotrapeziotrapezoidal osteoarthritis.  PROCEDURE:   1. Right wrist distal pole of scaphoid excision 2. Right thumb suspensionplasty with TightRope.  SURGEON:  Leanora Cover, MD.  ASSISTANT:  Daryll Brod, MD.  ANESTHESIA:  General with regional.  IV FLUIDS:  Per anesthesia flow sheet.  ESTIMATED BLOOD LOSS:  Minimal.  COMPLICATIONS:  None.  SPECIMENS:  None.  TOURNIQUET TIME:  55 minutes.  DISPOSITION:  Stable to PACU.  INDICATIONS:  Mr. Peddy is a 77 year old male, who has undergone multiple procedures of his right wrist including a suspension plasty. He has continued to have pain at the radial side of the wrist, particularly at the STT joint.  He wishes to undergo a distal pole of scaphoid excision and potential repeat suspension plasty.  Risks, benefits, and alternatives of surgery were discussed including risk of blood loss; infection; damage to nerves, vessels, tendons, ligaments, bone; failure of surgery; need for additional surgery; complications with wound healing; continued pain.  He voiced understanding of these risks and elected to proceed.  OPERATIVE COURSE:  After being identified preoperatively by myself, the patient and I agreed upon procedure and site of procedure.  Surgical site was marked.  Risks, benefits, and alternatives of surgery were reviewed and he wished to proceed.  Surgical consent had been signed. He was given IV vancomycin as preoperative antibiotic prophylaxis.  He was transferred to the operating room and placed on the  operating room table in supine position with the right upper extremity on arm board.  A regional block had been performed by Anesthesia in preoperative holding. General anesthesia was induced in the operating room.  Right upper extremity was prepped and draped in normal sterile orthopedic fashion. Surgical pause was performed between surgeons, Anesthesia, and operating room staff; and all were in agreement as to the patient, procedure, and site of procedure.  Tourniquet at the proximal aspect of the extremity was inflated to 250 mmHg after exsanguination of the limb with an Esmarch bandage.  Previous incision was followed at the [facility] joint of the thumb and carried into the subcutaneous tissues by spreading technique. The radial artery was identified and protected throughout the case.  The previous APL suspension plasty was identified and appeared intact. There was scar tissue within the trapeziectomy site.  The distal pole of the scaphoid was identified and cleared of soft tissue attachments.  A rongeur was used to excise the distal pole of the scaphoid.  C-arm was used in AP and lateral projections to ensure appropriate decompression at the STT joint, which was the case.  There was subsidence of the thumb metacarpal.  It was felt that a TightRope augmentation would be appropriate.  We did not feel that a repeat suspension plasty using  local tendinous tissue was necessary.  The TightRope was then passed from the metacarpal base of the thumb across the index finger metacarpal.  The buttons were placed down.  They had good secure hold against the bone.  This was tightened down.  The C-arm was used in AP and lateral projections to ensure appropriate suspension of the thumb metacarpal, which was the case.  The wounds were copiously irrigated with sterile saline and closed with 4-0 nylon in a horizontal mattress fashion.  They were then dressed with sterile Xeroform, 4x4s, and wrapped with a  Kerlix bandage.  A volar and right thumb spica splint were placed and wrapped with Kerlix and Ace bandage.  Tourniquet was deflated at 55 minutes.  Fingertips were pink with brisk capillary refill after deflation of tourniquet.  The operative drapes were broken down.  The patient was awoken from anesthesia safely.  He was transferred back to stretcher and taken to PACU in stable condition.  I will see him back in the office in 1 week for postoperative followup.  I will give him Percocet 5/325 one to two p.o. q.6 hours p.r.n. pain, dispensed #30.     Leanora Cover, MD     KK/MEDQ  D:  11/02/2017  T:  11/02/2017  Job:  [id number]

## 2017-11-08 ENCOUNTER — Encounter (HOSPITAL_BASED_OUTPATIENT_CLINIC_OR_DEPARTMENT_OTHER): Payer: Self-pay | Admitting: Orthopedic Surgery

## 2017-11-08 DIAGNOSIS — M1811 Unilateral primary osteoarthritis of first carpometacarpal joint, right hand: Secondary | ICD-10-CM | POA: Diagnosis not present

## 2017-11-08 DIAGNOSIS — M19031 Primary osteoarthritis, right wrist: Secondary | ICD-10-CM | POA: Diagnosis not present

## 2017-11-17 DIAGNOSIS — H2513 Age-related nuclear cataract, bilateral: Secondary | ICD-10-CM | POA: Diagnosis not present

## 2017-11-17 DIAGNOSIS — H353221 Exudative age-related macular degeneration, left eye, with active choroidal neovascularization: Secondary | ICD-10-CM | POA: Diagnosis not present

## 2017-11-17 DIAGNOSIS — H353111 Nonexudative age-related macular degeneration, right eye, early dry stage: Secondary | ICD-10-CM | POA: Diagnosis not present

## 2017-11-21 ENCOUNTER — Other Ambulatory Visit (HOSPITAL_COMMUNITY): Payer: Self-pay | Admitting: Psychiatry

## 2017-11-21 DIAGNOSIS — F319 Bipolar disorder, unspecified: Secondary | ICD-10-CM

## 2017-11-23 ENCOUNTER — Ambulatory Visit (INDEPENDENT_AMBULATORY_CARE_PROVIDER_SITE_OTHER): Payer: Medicare HMO

## 2017-11-23 ENCOUNTER — Other Ambulatory Visit (HOSPITAL_COMMUNITY): Payer: Self-pay | Admitting: Psychiatry

## 2017-11-23 DIAGNOSIS — R69 Illness, unspecified: Secondary | ICD-10-CM | POA: Diagnosis not present

## 2017-11-23 DIAGNOSIS — F3112 Bipolar disorder, current episode manic without psychotic features, moderate: Secondary | ICD-10-CM | POA: Diagnosis not present

## 2017-11-23 MED ORDER — ARIPIPRAZOLE ER 400 MG IM SRER
400.0000 mg | INTRAMUSCULAR | Status: DC
  Administered 2017-11-23 – 2019-09-23 (×24): 400 mg via INTRAMUSCULAR

## 2017-11-23 NOTE — Progress Notes (Signed)
Patient in today with appropriate affect for due Abilify Maintena 400 mg IM injection. Patient denied any auditory or visual hallucinations, no suicidal or homicidal ideations and reports mood has been good with no current concerns.Patient's due Abilify Maintena 400 mg IM injection prepared as ordered and given to patient in his right upper outer gluteal area. Patient tolerated due injection without complaint of pain or discomfort and agreed to return in 30 days for next due injection. Patient to call if any problems prior to then.

## 2017-11-24 DIAGNOSIS — F411 Generalized anxiety disorder: Secondary | ICD-10-CM | POA: Diagnosis not present

## 2017-11-24 DIAGNOSIS — R69 Illness, unspecified: Secondary | ICD-10-CM | POA: Diagnosis not present

## 2017-12-20 ENCOUNTER — Ambulatory Visit (INDEPENDENT_AMBULATORY_CARE_PROVIDER_SITE_OTHER): Payer: Medicare HMO

## 2017-12-20 DIAGNOSIS — F3112 Bipolar disorder, current episode manic without psychotic features, moderate: Secondary | ICD-10-CM | POA: Diagnosis not present

## 2017-12-20 DIAGNOSIS — R69 Illness, unspecified: Secondary | ICD-10-CM | POA: Diagnosis not present

## 2017-12-20 NOTE — Progress Notes (Signed)
Patient in today with appropriate affect for due Abilify Maintena 400 mg IM injection. Patient denied any auditory or visual hallucinations, no suicidal or homicidal ideations and reports mood has been good with no current concerns.Patient's due Abilify Maintena 400 mg IM injection prepared as ordered and given to patient in hisleftupper outer gluteal area. Patient tolerated due injection without complaint of pain or discomfort and agreed to return in 30 days for next due injection. Patient to call if any problems prior to then.

## 2017-12-27 DIAGNOSIS — H2513 Age-related nuclear cataract, bilateral: Secondary | ICD-10-CM | POA: Diagnosis not present

## 2017-12-27 DIAGNOSIS — H353221 Exudative age-related macular degeneration, left eye, with active choroidal neovascularization: Secondary | ICD-10-CM | POA: Diagnosis not present

## 2017-12-27 DIAGNOSIS — H353111 Nonexudative age-related macular degeneration, right eye, early dry stage: Secondary | ICD-10-CM | POA: Diagnosis not present

## 2017-12-29 DIAGNOSIS — F411 Generalized anxiety disorder: Secondary | ICD-10-CM | POA: Diagnosis not present

## 2017-12-29 DIAGNOSIS — R69 Illness, unspecified: Secondary | ICD-10-CM | POA: Diagnosis not present

## 2018-01-03 DIAGNOSIS — M1811 Unilateral primary osteoarthritis of first carpometacarpal joint, right hand: Secondary | ICD-10-CM | POA: Diagnosis not present

## 2018-01-09 DIAGNOSIS — H353222 Exudative age-related macular degeneration, left eye, with inactive choroidal neovascularization: Secondary | ICD-10-CM | POA: Diagnosis not present

## 2018-01-09 DIAGNOSIS — H2513 Age-related nuclear cataract, bilateral: Secondary | ICD-10-CM | POA: Diagnosis not present

## 2018-01-09 DIAGNOSIS — H25013 Cortical age-related cataract, bilateral: Secondary | ICD-10-CM | POA: Diagnosis not present

## 2018-01-18 DIAGNOSIS — R69 Illness, unspecified: Secondary | ICD-10-CM | POA: Diagnosis not present

## 2018-01-22 ENCOUNTER — Ambulatory Visit (INDEPENDENT_AMBULATORY_CARE_PROVIDER_SITE_OTHER): Payer: Medicare HMO

## 2018-01-22 DIAGNOSIS — F3112 Bipolar disorder, current episode manic without psychotic features, moderate: Secondary | ICD-10-CM

## 2018-01-22 DIAGNOSIS — F319 Bipolar disorder, unspecified: Secondary | ICD-10-CM

## 2018-01-22 DIAGNOSIS — R69 Illness, unspecified: Secondary | ICD-10-CM | POA: Diagnosis not present

## 2018-01-22 NOTE — Progress Notes (Signed)
Patient in todaywith appropriate affectfor due Abilify Maintena 400 mg IM injection. Patient denied any auditory or visual hallucinations, no suicidal or homicidal ideations and reports mood has been good with no current concerns.Patient's due Abilify Maintena 400 mg IM injection prepared as ordered and given to patient in hisrightupper outer gluteal area. Patient tolerated due injection without complaint of pain or discomfort and agreed to return in 30 days for next due injection. Patient to call if any problems prior to then.

## 2018-01-29 ENCOUNTER — Encounter (HOSPITAL_COMMUNITY): Payer: Self-pay | Admitting: Psychiatry

## 2018-01-29 ENCOUNTER — Ambulatory Visit (HOSPITAL_COMMUNITY): Payer: Medicare HMO | Admitting: Psychiatry

## 2018-01-29 DIAGNOSIS — F319 Bipolar disorder, unspecified: Secondary | ICD-10-CM

## 2018-01-29 DIAGNOSIS — Z818 Family history of other mental and behavioral disorders: Secondary | ICD-10-CM | POA: Diagnosis not present

## 2018-01-29 DIAGNOSIS — Z811 Family history of alcohol abuse and dependence: Secondary | ICD-10-CM

## 2018-01-29 DIAGNOSIS — F419 Anxiety disorder, unspecified: Secondary | ICD-10-CM | POA: Diagnosis not present

## 2018-01-29 DIAGNOSIS — R69 Illness, unspecified: Secondary | ICD-10-CM | POA: Diagnosis not present

## 2018-01-29 MED ORDER — TEMAZEPAM 15 MG PO CAPS: 15.0000 mg | ORAL_CAPSULE | Freq: Every evening | ORAL | 0 refills | Status: DC | PRN

## 2018-01-29 MED ORDER — TRAZODONE HCL 100 MG PO TABS: ORAL_TABLET | ORAL | 0 refills | Status: DC

## 2018-01-29 MED ORDER — CITALOPRAM HYDROBROMIDE 20 MG PO TABS: 20.0000 mg | ORAL_TABLET | Freq: Every day | ORAL | 0 refills | Status: DC

## 2018-01-29 NOTE — Progress Notes (Signed)
BH MD/PA/NP OP Progress Note  01/29/2018 10:31 AM Sean Hill  MRN:  [id number]  Chief Complaint: I am doing good.  I like the medication.  HPI: Sun came for his follow-up appointment.  He is compliant with medication and denies any side effects.  He has chronic side effects including memory concentration and dry mouth.  However he is happy that his current psychiatric medication helping his mood, mania and psychosis.  He endorses memory and concentration problem is chronic but stable.  He is also diagnosed with macular degeneration and seeing Dr. Baird Cancer at retina specialist.  Denies any agitation, paranoia, hallucination.  Despite difficulty remembering things he continues to drive without any problem.  He lives with his wife who is very supportive.  Patient has mild tremors but does not interfere in his daily activities.  He is seeing [name] for CBT and supportive therapy.  Patient is getting monthly injection of Abilify.  Patient denies drinking or using any illegal substances.  He is sleeping good with temazepam and trazodone.  Visit Diagnosis:    ICD-10-CM   1. Bipolar I disorder (HCC) F31.9 traZODone (DESYREL) 100 MG tablet    temazepam (RESTORIL) 15 MG capsule    citalopram (CELEXA) 20 MG tablet    Past Psychiatric History: Reviewed. Patient has at least 4-5 psychiatric hospitalization. His last psychiatric admission was in July 2014 . He has history of taking overdose on his medication and mixing with alcohol. He has a history of cutting his wrist. He has taken Tegretol, Depakote, Ambien, Remeron, Vistaril , Geodon, Abilify, lithium, Provigil, Zoloft, Neurontin , Lamictal, Wellbutrin , Risperdal and Prstiq. He took overdose on [place]. Patient had history of mania and severe impulsive behavior. He admitted history of buying unnecessary, getting speeding tickets and aggression. Patient reported history of side effects due to polypharmacy.   Past Medical History:  Past  Medical History:  Diagnosis Date  . Anxiety   . Arthritis    right wrist; pt. states "everywhere"  . Bipolar disorder ([place])   . Depression   . GERD (gastroesophageal reflux disease)   . History of kidney stones   . 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 Hyperlipidemia   . Hypertension    states under control with med., has been on med. x 1 yr.  . Impaired hearing   . Left bundle branch block (LBBB) on electrocardiogram 10/29/2015  . Macular degeneration (senile) of retina    left    Past Surgical History:  Procedure Laterality Date  . ANKLE FUSION Bilateral   . ANKLE SURGERY Bilateral    ligament surgery  . CARDIAC CATHETERIZATION  x 2   1983; 11/14/2003  . CARPOMETACARPEL SUSPENSION PLASTY Right 09/22/2016   Procedure: right thumb carpometacarpal  ARTHROPLASTY;  Surgeon: Leanora Cover, MD;  Location: [place];  Service: Orthopedics;  Laterality: Right;  right thumb carpometacarpal  ARTHROPLASTY  . CARPOMETACARPEL SUSPENSION PLASTY Right 11/02/2017   Procedure: RIGHT THUMB SUSPENSIONPLASTY WITH TIGHTROPE, DISTAL POLE SCAPHOID  EXCISION;  Surgeon: Leanora Cover, MD;  Location: [place];  Service: Orthopedics;  Laterality: Right;  . ELBOW SURGERY Left   . INGUINAL HERNIA REPAIR Right   . OPEN REDUCTION INTERNAL FIXATION (ORIF) DISTAL RADIAL FRACTURE Right 10/29/2015   Procedure: OPEN REDUCTION INTERNAL FIXATION (ORIF) DISTAL RADIAL FRACTURE;  Surgeon: Leanora Cover, MD;  Location: [place];  Service: Orthopedics;  Laterality: Right;  . ORIF ELBOW FRACTURE Right   .  TONSILLECTOMY    . TOTAL KNEE ARTHROPLASTY Bilateral   . ULNAR COLLATERAL LIGAMENT REPAIR Right 12/29/2015   Procedure: REPAIR  RIGHT LATERAL ULNAR COLLATERAL LIGAMENT TEAR  EXTENSOR ORIGIN ;  Surgeon: Leanora Cover, MD;  Location: [place];  Service: Orthopedics;  Laterality: Right;  . WRIST ARTHROSCOPY WITH DEBRIDEMENT Right 07/14/2016    Procedure: RIGHT WRIST ARTHROSCOPY WITH DEBRIDEMENT  TRIANGULAR FIBROCARTILAGE COMPLEX;  Surgeon: Leanora Cover, MD;  Location: [place];  Service: Orthopedics;  Laterality: Right;    Family Psychiatric History: Reviewed.  Family History:  Family History  Problem Relation Age of Onset  . Alcohol abuse Mother   . Ovarian cancer Mother   . Alcohol abuse Father   . Pancreatic cancer Father   . Depression Daughter   . Paranoid behavior Daughter   . Hyperlipidemia Brother     Social History:  Social History   Socioeconomic History  . Marital status: Married    Spouse name: Not on file  . Number of children: Not on file  . Years of education: Not on file  . Highest education level: Not on file  Occupational History  . Not on file  Social Needs  . Financial resource strain: Not on file  . Food insecurity:    Worry: Not on file    Inability: Not on file  . Transportation needs:    Medical: Not on file    Non-medical: Not on file  Tobacco Use  . Smoking status: Never Smoker  . Smokeless tobacco: Never Used  Substance and Sexual Activity  . Alcohol use: Yes    Comment: occasionally  . Drug use: No  . Sexual activity: Not Currently    Birth control/protection: None  Lifestyle  . Physical activity:    Days per week: Not on file    Minutes per session: Not on file  . Stress: Not on file  Relationships  . Social connections:    Talks on phone: Not on file    Gets together: Not on file    Attends religious service: Not on file    Active member of club or organization: Not on file    Attends meetings of clubs or organizations: Not on file    Relationship status: Not on file  Other Topics Concern  . Not on file  Social History Narrative   Married, lives in [place].  Retired Dance movement psychotherapist.   Two daughters, both married,3 grandchildren ( 21, 10, 1 year)    No regular exercise.  Never smoker.  No ETOH.  No drugs.    Allergies:  Allergies  Allergen  Reactions  . Penicillins Hives    BLISTERS  . Codeine Rash    Metabolic Disorder Labs: Lab Results  Component Value Date   HGBA1C 6.1 09/21/2017   MPG 123 (H) 12/03/2014   No results found for: PROLACTIN Lab Results  Component Value Date   CHOL 133 09/21/2017   TRIG 276.0 (H) 09/21/2017   HDL 30.80 (L) 09/21/2017   CHOLHDL 4 09/21/2017   VLDL 55.2 (H) 09/21/2017   Lab Results  Component Value Date   TSH 2.30 09/21/2017   TSH 2.09 08/18/2016    Therapeutic Level Labs: Lab Results  Component Value Date   LITHIUM 0.15 (L) 12/04/2012   LITHIUM 0.40 (L) 11/14/2012   No results found for: VALPROATE No components found for:  CBMZ  Current Medications: Current Outpatient Medications  Medication Sig Dispense Refill  . ARIPiprazole ER 400 MG  SRER Inject 400 mg into the muscle every 30 (thirty) days. 1 each 2  . aspirin 81 MG tablet Take 81 mg by mouth daily.    Marland Kitchen atorvastatin (LIPITOR) 40 MG tablet TAKE 1 TABLET (40 MG TOTAL) BY MOUTH DAILY. 90 tablet 3  . benztropine (COGENTIN) 1 MG tablet Take 1 tablet (1 mg total) by mouth daily. 90 tablet 0  . citalopram (CELEXA) 20 MG tablet TAKE 1 TABLET BY MOUTH EVERY DAY 90 tablet 0  . fenofibrate (TRICOR) 145 MG tablet Take 1 tablet (145 mg total) by mouth daily. 90 tablet 3  . lisinopril (PRINIVIL,ZESTRIL) 20 MG tablet TAKE 1 TABLET (20 MG TOTAL) BY MOUTH DAILY. 90 tablet 3  . Multiple Vitamin (MULTIVITAMIN WITH MINERALS) TABS tablet Take 1 tablet by mouth daily.    . Omega-3 Fatty Acids (FISH OIL PO) Take by mouth.    Marland Kitchen omeprazole (PRILOSEC) 40 MG capsule Take 40 mg by mouth daily.    Marland Kitchen oxyCODONE-acetaminophen (PERCOCET) 5-325 MG tablet 1-2 tabs PO q6 hours prn pain 30 tablet 0  . temazepam (RESTORIL) 15 MG capsule Take 1 capsule (15 mg total) by mouth at bedtime as needed. for sleep 90 capsule 0  . traZODone (DESYREL) 100 MG tablet Take 1/2 to 1 tab at bed time 90 tablet 0  . Vitamin D, Cholecalciferol, 1000 UNITS TABS Take 1  tablet by mouth daily.     Current Facility-Administered Medications  Medication Dose Route Frequency Provider Last Rate Last Dose  . ARIPiprazole ER (ABILIFY MAINTENA) injection 400 mg  400 mg Intramuscular Q28 days Shariece Viveiros, Arlyce Harman, MD   400 mg at 01/22/18 1614  . ipratropium-albuterol (DUONEB) 0.5-2.5 (3) MG/3ML nebulizer solution 3 mL  3 mL Nebulization Once Nafziger, Tommi Rumps, NP         Musculoskeletal: Strength & Muscle Tone: within normal limits Gait & Station: normal Patient leans: N/A  Psychiatric Specialty Exam: ROS  Blood pressure 126/72, pulse 87, height 5' 8.5" (1.74 m), weight 264 lb 6.4 oz (119.9 kg).Body mass index is 39.62 kg/m.  General Appearance: Casual  Eye Contact:  Fair  Speech:  Clear and Coherent  Volume:  Normal  Mood:  Euthymic  Affect:  Appropriate  Thought Process:  Goal Directed  Orientation:  Full (Time, Place, and Person)  Thought Content: Logical   Suicidal Thoughts:  No  Homicidal Thoughts:  No  Memory:  Immediate;   Fair Recent;   Fair Remote;   Fair  Judgement:  Good  Insight:  Good  Psychomotor Activity:  Tremor  Concentration:  Concentration: Fair and Attention Span: Fair  Recall:  [place] of Knowledge: Good  Language: Good  Akathisia:  No  Handed:  Right  AIMS (if indicated): not done  Assets:  Communication Skills Desire for Improvement Housing Resilience  ADL's:  Intact  Cognition: Impaired,  Mild  Sleep:  Good   Screenings: AUDIT     Admission (Discharged) from OP Visit from 12/04/2012 in [facility]  Alcohol Use Disorder Identification Test Final Score (AUDIT)  1    PHQ2-9     Office Visit from 09/21/2017 in [facility] from 02/23/2015 in [facility] Office Visit from 01/02/2015 in [facility] from 12/02/2013 in [facility]  PHQ-2 Total Score  0  1  0  4        Assessment and Plan: Bipolar disorder type I.  Anxiety disorder NOS.  Patient is a stable on his current medication.  I recommended to try cutting down trazodone to take half tablet however if started to have sleep issue then resume 100 mg at bedtime.  Continue Cogentin 1 mg at bedtime, Celexa 20 mg daily, temazepam 15 mg at bedtime and Abilify injection 400 mg intramuscular every 4 weeks.  Continue counseling with [name].  Recommended to call us back if he has any question, concern for feel worsening of the symptoms.  If his memory problem continued to get worse then we will get neurology consult.  Follow-up in 3 months   [name], MD 01/29/2018, 10:31 AM

## 2018-02-16 DIAGNOSIS — R69 Illness, unspecified: Secondary | ICD-10-CM | POA: Diagnosis not present

## 2018-02-21 ENCOUNTER — Encounter (HOSPITAL_COMMUNITY): Payer: Self-pay

## 2018-02-21 ENCOUNTER — Ambulatory Visit (INDEPENDENT_AMBULATORY_CARE_PROVIDER_SITE_OTHER): Payer: Medicare HMO

## 2018-02-21 DIAGNOSIS — H353221 Exudative age-related macular degeneration, left eye, with active choroidal neovascularization: Secondary | ICD-10-CM | POA: Diagnosis not present

## 2018-02-21 DIAGNOSIS — H353111 Nonexudative age-related macular degeneration, right eye, early dry stage: Secondary | ICD-10-CM | POA: Diagnosis not present

## 2018-02-21 DIAGNOSIS — R69 Illness, unspecified: Secondary | ICD-10-CM | POA: Diagnosis not present

## 2018-02-21 DIAGNOSIS — H2513 Age-related nuclear cataract, bilateral: Secondary | ICD-10-CM | POA: Diagnosis not present

## 2018-02-21 DIAGNOSIS — F319 Bipolar disorder, unspecified: Secondary | ICD-10-CM | POA: Diagnosis not present

## 2018-02-21 NOTE — Progress Notes (Signed)
Patient in todaywith appropriate affectfor due Abilify Maintena 400 mg IM injection. Patient denied any auditory or visual hallucinations, no suicidal or homicidal ideations and reports mood has been good with no current concerns.Patient's due Abilify Maintena 400 mg IM injection prepared as ordered and given to patient in hisleft upper outer gluteal area. Patient tolerated due injection without complaint of pain or discomfort and agreed to return in 30 days for next due injection. Patient to call if any problems prior to then.

## 2018-03-04 ENCOUNTER — Other Ambulatory Visit: Payer: Self-pay | Admitting: Adult Health

## 2018-03-04 DIAGNOSIS — J0101 Acute recurrent maxillary sinusitis: Secondary | ICD-10-CM

## 2018-03-06 NOTE — Telephone Encounter (Signed)
Sent to the pharmacy by e-scribe. 

## 2018-03-26 ENCOUNTER — Ambulatory Visit (INDEPENDENT_AMBULATORY_CARE_PROVIDER_SITE_OTHER): Payer: Medicare HMO

## 2018-03-26 DIAGNOSIS — R69 Illness, unspecified: Secondary | ICD-10-CM | POA: Diagnosis not present

## 2018-03-26 DIAGNOSIS — F319 Bipolar disorder, unspecified: Secondary | ICD-10-CM

## 2018-03-26 NOTE — Progress Notes (Signed)
Patient in todaywith appropriate affectfor due Abilify Maintena 400 mg IM injection. Patient denied any auditory or visual hallucinations, no suicidal or homicidal ideations and reports mood has been good with no current concerns.Patient's due Abilify Maintena 400 mg IM injection prepared as ordered and given to patient in hisright upper outer gluteal area. Patient tolerated due injection without complaint of pain or discomfort and agreed to return in 28 days for next due injection. Patient to call if any problems prior to then.

## 2018-04-04 DIAGNOSIS — F411 Generalized anxiety disorder: Secondary | ICD-10-CM | POA: Diagnosis not present

## 2018-04-04 DIAGNOSIS — R69 Illness, unspecified: Secondary | ICD-10-CM | POA: Diagnosis not present

## 2018-04-14 ENCOUNTER — Other Ambulatory Visit: Payer: Self-pay | Admitting: Adult Health

## 2018-04-14 DIAGNOSIS — K219 Gastro-esophageal reflux disease without esophagitis: Secondary | ICD-10-CM

## 2018-04-17 NOTE — Telephone Encounter (Signed)
Filled for 1 year on 09/21/2017.  Message sent to the pharmacy by e-scribe.

## 2018-04-23 ENCOUNTER — Ambulatory Visit (HOSPITAL_COMMUNITY): Payer: Self-pay

## 2018-04-24 ENCOUNTER — Ambulatory Visit (INDEPENDENT_AMBULATORY_CARE_PROVIDER_SITE_OTHER): Payer: Medicare HMO

## 2018-04-24 DIAGNOSIS — R69 Illness, unspecified: Secondary | ICD-10-CM | POA: Diagnosis not present

## 2018-04-24 DIAGNOSIS — F310 Bipolar disorder, current episode hypomanic: Secondary | ICD-10-CM | POA: Diagnosis not present

## 2018-04-24 NOTE — Progress Notes (Signed)
Patient in todaywith appropriate affectfor due Abilify Maintena 400 mg IM injection. Patient denied any auditory or visual hallucinations, no suicidal or homicidal ideations and reports mood has been good with no current concerns.Patient's due Abilify Maintena 400 mg IM injection prepared as ordered and administered to patient in hisleftupper outer gluteal area.Patient tolerated due injection without complaint of pain or discomfort and agreed to return in 28 days for next due injection. Patient to call if any problems prior to then.

## 2018-04-27 ENCOUNTER — Ambulatory Visit (INDEPENDENT_AMBULATORY_CARE_PROVIDER_SITE_OTHER): Payer: Medicare HMO | Admitting: Adult Health

## 2018-04-27 ENCOUNTER — Encounter: Payer: Self-pay | Admitting: Adult Health

## 2018-04-27 ENCOUNTER — Ambulatory Visit: Payer: Self-pay | Admitting: Adult Health

## 2018-04-27 VITALS — BP 118/70 | HR 70 | Temp 98.2°F | Wt 261.0 lb

## 2018-04-27 DIAGNOSIS — J069 Acute upper respiratory infection, unspecified: Secondary | ICD-10-CM

## 2018-04-27 DIAGNOSIS — B356 Tinea cruris: Secondary | ICD-10-CM

## 2018-04-27 DIAGNOSIS — L298 Other pruritus: Secondary | ICD-10-CM | POA: Diagnosis not present

## 2018-04-27 MED ORDER — NYSTATIN 100000 UNIT/GM EX POWD: Freq: Four times a day (QID) | CUTANEOUS | 0 refills | Status: DC

## 2018-04-27 MED ORDER — DOXYCYCLINE HYCLATE 100 MG PO CAPS: 100.0000 mg | ORAL_CAPSULE | Freq: Two times a day (BID) | ORAL | 0 refills | Status: DC

## 2018-04-27 MED ORDER — BENZONATATE 200 MG PO CAPS: 200.0000 mg | ORAL_CAPSULE | Freq: Two times a day (BID) | ORAL | 0 refills | Status: DC | PRN

## 2018-04-27 NOTE — Progress Notes (Signed)
Subjective:    Patient ID: Sean Hill, male    DOB: 08-05-1941, 77 y.o.   MRN: [id number]  HPI  77 year old male who  has a past medical history of Anxiety, Arthritis, Bipolar disorder ([place]), Depression, GERD (gastroesophageal reflux disease), History of kidney stones, History of MRSA infection, Hyperlipidemia, Hypertension, Impaired hearing, Left bundle branch block (LBBB) on electrocardiogram (10/29/2015), and Macular degeneration (senile) of retina.  He presents to the office today for multiple complaints.   1. Cough x 2-3 months. Reports very little sputum. Denies fevers but has chills. Feels congested in his chest. Feels short of breath at times and have wheezing. Denies any chest pain. Does not feel acutely ill. Has no sinus pain or pressure, feeling of ear fullness, n/v/d   2. Rash - reports red, itch rash in his groin. Has tried various OTC anti fungal creams without resolution   Review of Systems See HPI   Past Medical History:  Diagnosis Date  . Anxiety   . Arthritis    right wrist; pt. states "everywhere"  . Bipolar disorder ([place])   . Depression   . GERD (gastroesophageal reflux disease)   . History of kidney stones   . 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 Hyperlipidemia   . Hypertension    states under control with med., has been on med. x 1 yr.  . Impaired hearing   . Left bundle branch block (LBBB) on electrocardiogram 10/29/2015  . Macular degeneration (senile) of retina    left    Social History   Socioeconomic History  . Marital status: Married    Spouse name: Not on file  . Number of children: Not on file  . Years of education: Not on file  . Highest education level: Not on file  Occupational History  . Not on file  Social Needs  . Financial resource strain: Not on file  . Food insecurity:    Worry: Not on file    Inability: Not on file  . Transportation needs:    Medical: Not on file    Non-medical: Not on file   Tobacco Use  . Smoking status: Never Smoker  . Smokeless tobacco: Never Used  Substance and Sexual Activity  . Alcohol use: Yes    Comment: occasionally  . Drug use: No  . Sexual activity: Not Currently    Birth control/protection: None  Lifestyle  . Physical activity:    Days per week: Not on file    Minutes per session: Not on file  . Stress: Not on file  Relationships  . Social connections:    Talks on phone: Not on file    Gets together: Not on file    Attends religious service: Not on file    Active member of club or organization: Not on file    Attends meetings of clubs or organizations: Not on file    Relationship status: Not on file  . Intimate partner violence:    Fear of current or ex partner: Not on file    Emotionally abused: Not on file    Physically abused: Not on file    Forced sexual activity: Not on file  Other Topics Concern  . Not on file  Social History Narrative   Married, lives in [place].  Retired Dance movement psychotherapist.   Two daughters, both married,3 grandchildren ( 66, 45, 1 year)    No regular exercise.  Never smoker.  No  ETOH.  No drugs.    Past Surgical History:  Procedure Laterality Date  . ANKLE FUSION Bilateral   . ANKLE SURGERY Bilateral    ligament surgery  . CARDIAC CATHETERIZATION  x 2   1983; 11/14/2003  . CARPOMETACARPEL SUSPENSION PLASTY Right 09/22/2016   Procedure: right thumb carpometacarpal  ARTHROPLASTY;  Surgeon: Leanora Cover, MD;  Location: [facility];  Service: Orthopedics;  Laterality: Right;  right thumb carpometacarpal  ARTHROPLASTY  . CARPOMETACARPEL SUSPENSION PLASTY Right 11/02/2017   Procedure: RIGHT THUMB SUSPENSIONPLASTY WITH TIGHTROPE, DISTAL POLE SCAPHOID  EXCISION;  Surgeon: Leanora Cover, MD;  Location: [facility];  Service: Orthopedics;  Laterality: Right;  . ELBOW SURGERY Left   . INGUINAL HERNIA REPAIR Right   . OPEN REDUCTION INTERNAL FIXATION (ORIF) DISTAL RADIAL FRACTURE Right 10/29/2015    Procedure: OPEN REDUCTION INTERNAL FIXATION (ORIF) DISTAL RADIAL FRACTURE;  Surgeon: Leanora Cover, MD;  Location: [facility];  Service: Orthopedics;  Laterality: Right;  . ORIF ELBOW FRACTURE Right   . TONSILLECTOMY    . TOTAL KNEE ARTHROPLASTY Bilateral   . ULNAR COLLATERAL LIGAMENT REPAIR Right 12/29/2015   Procedure: REPAIR  RIGHT LATERAL ULNAR COLLATERAL LIGAMENT TEAR  EXTENSOR ORIGIN ;  Surgeon: Leanora Cover, MD;  Location: [facility];  Service: Orthopedics;  Laterality: Right;  . WRIST ARTHROSCOPY WITH DEBRIDEMENT Right 07/14/2016   Procedure: RIGHT WRIST ARTHROSCOPY WITH DEBRIDEMENT  TRIANGULAR FIBROCARTILAGE COMPLEX;  Surgeon: Leanora Cover, MD;  Location: [facility];  Service: Orthopedics;  Laterality: Right;    Family History  Problem Relation Age of Onset  . Alcohol abuse Mother   . Ovarian cancer Mother   . Alcohol abuse Father   . Pancreatic cancer Father   . Depression Daughter   . Paranoid behavior Daughter   . Hyperlipidemia Brother     Allergies  Allergen Reactions  . Penicillins Hives    BLISTERS  . Codeine Rash    Current Outpatient Medications on File Prior to Visit  Medication Sig Dispense Refill  . ARIPiprazole ER 400 MG SRER Inject 400 mg into the muscle every 30 (thirty) days. 1 each 2  . aspirin 81 MG tablet Take 81 mg by mouth daily.    Marland Kitchen atorvastatin (LIPITOR) 40 MG tablet TAKE 1 TABLET (40 MG TOTAL) BY MOUTH DAILY. 90 tablet 3  . benztropine (COGENTIN) 1 MG tablet Take 1 tablet (1 mg total) by mouth daily. 90 tablet 0  . fenofibrate (TRICOR) 145 MG tablet Take 1 tablet (145 mg total) by mouth daily. 90 tablet 3  . fluticasone (FLONASE) 50 MCG/ACT nasal spray SPRAY 2 SPRAYS INTO EACH NOSTRIL EVERY DAY 48 g 1  . lisinopril (PRINIVIL,ZESTRIL) 20 MG tablet TAKE 1 TABLET (20 MG TOTAL) BY MOUTH DAILY. 90 tablet 3  . Multiple Vitamin (MULTIVITAMIN WITH MINERALS) TABS tablet Take 1 tablet by mouth daily.    . Omega-3  Fatty Acids (FISH OIL PO) Take by mouth.    Marland Kitchen omeprazole (PRILOSEC) 40 MG capsule Take 40 mg by mouth daily.    . temazepam (RESTORIL) 15 MG capsule Take 1 capsule (15 mg total) by mouth at bedtime as needed. for sleep 90 capsule 0  . traZODone (DESYREL) 100 MG tablet Take 1/2 to 1 tab at bed time 90 tablet 0  . Vitamin D, Cholecalciferol, 1000 UNITS TABS Take 1 tablet by mouth daily.     Current Facility-Administered Medications on File Prior to Visit  Medication Dose Route Frequency  Provider Last Rate Last Dose  . ARIPiprazole ER (ABILIFY MAINTENA) injection 400 mg  400 mg Intramuscular Q28 days [name], MD   400 mg at 04/24/18 1634  . ipratropium-albuterol (DUONEB) 0.5-2.5 (3) MG/3ML nebulizer solution 3 mL  3 mL Nebulization Once Ad Guttman, NP        BP 118/70   Pulse 70   Temp 98.2 F (36.8 C) (Oral)   Wt 261 lb (118.4 kg)   SpO2 97%   BMI 39.11 kg/m       Objective:   Physical Exam  Constitutional: He is oriented to person, place, and time. He appears well-developed and well-nourished.  Non-toxic appearance. He does not appear ill. No distress.  HENT:  Head: Normocephalic.  Right Ear: Hearing, tympanic membrane, external ear and ear canal normal.  Left Ear: Hearing, tympanic membrane, external ear and ear canal normal.  Nose: Nose normal. No mucosal edema or rhinorrhea. Right sinus exhibits no maxillary sinus tenderness and no frontal sinus tenderness. Left sinus exhibits no maxillary sinus tenderness and no frontal sinus tenderness.  Mouth/Throat: Uvula is midline, oropharynx is clear and moist and mucous membranes are normal. No oropharyngeal exudate.  Eyes: Pupils are equal, round, and reactive to light. EOM are normal.  Cardiovascular: Normal rate, regular rhythm, normal heart sounds and intact distal pulses. Exam reveals no gallop and no friction rub.  No murmur heard. Pulmonary/Chest: Effort normal. No stridor. No respiratory distress. He has wheezes (trace  throughout ). He has no rales.  Neurological: He is alert and oriented to person, place, and time.  Skin: Skin is warm and dry. Rash noted. He is not diaphoretic. There is erythema.  Red flat rash noted on bilateral sided of groin. + odor.   Psychiatric: He has a normal mood and affect. His behavior is normal. Judgment and thought content normal.  Nursing note and vitals reviewed.     Assessment & Plan:  1. Upper respiratory tract infection, unspecified type - Will treat due to symptoms and duration  - doxycycline (VIBRAMYCIN) 100 MG capsule; Take 1 capsule (100 mg total) by mouth 2 (two) times daily.  Dispense: 20 capsule; Refill: 0 - benzonatate (TESSALON) 200 MG capsule; Take 1 capsule (200 mg total) by mouth 2 (two) times daily as needed for cough.  Dispense: 20 capsule; Refill: 0 - Follow up if no improvement in the next 2-3 days   2. Jock itch  - nystatin (NYSTATIN) powder; Apply topically 4 (four) times daily.  Dispense: 15 g; Refill: 0   [name], NP

## 2018-05-01 ENCOUNTER — Ambulatory Visit (HOSPITAL_COMMUNITY): Payer: Self-pay | Admitting: Psychiatry

## 2018-05-02 DIAGNOSIS — H353231 Exudative age-related macular degeneration, bilateral, with active choroidal neovascularization: Secondary | ICD-10-CM | POA: Diagnosis not present

## 2018-05-02 DIAGNOSIS — H2513 Age-related nuclear cataract, bilateral: Secondary | ICD-10-CM | POA: Diagnosis not present

## 2018-05-02 DIAGNOSIS — H353221 Exudative age-related macular degeneration, left eye, with active choroidal neovascularization: Secondary | ICD-10-CM | POA: Diagnosis not present

## 2018-05-03 ENCOUNTER — Other Ambulatory Visit: Payer: Self-pay | Admitting: Family Medicine

## 2018-05-03 ENCOUNTER — Other Ambulatory Visit (HOSPITAL_COMMUNITY): Payer: Self-pay | Admitting: Psychiatry

## 2018-05-03 DIAGNOSIS — F319 Bipolar disorder, unspecified: Secondary | ICD-10-CM

## 2018-05-03 DIAGNOSIS — K219 Gastro-esophageal reflux disease without esophagitis: Secondary | ICD-10-CM

## 2018-05-03 MED ORDER — OMEPRAZOLE 40 MG PO CPDR: 40.0000 mg | DELAYED_RELEASE_CAPSULE | Freq: Two times a day (BID) | ORAL | 1 refills | Status: DC

## 2018-05-05 DIAGNOSIS — R69 Illness, unspecified: Secondary | ICD-10-CM | POA: Diagnosis not present

## 2018-05-08 ENCOUNTER — Other Ambulatory Visit (HOSPITAL_COMMUNITY): Payer: Self-pay

## 2018-05-08 DIAGNOSIS — F319 Bipolar disorder, unspecified: Secondary | ICD-10-CM

## 2018-05-08 MED ORDER — BENZTROPINE MESYLATE 1 MG PO TABS: 1.0000 mg | ORAL_TABLET | Freq: Every day | ORAL | 0 refills | Status: DC

## 2018-05-08 MED ORDER — TRAZODONE HCL 100 MG PO TABS: ORAL_TABLET | ORAL | 0 refills | Status: DC

## 2018-05-09 DIAGNOSIS — H353211 Exudative age-related macular degeneration, right eye, with active choroidal neovascularization: Secondary | ICD-10-CM | POA: Diagnosis not present

## 2018-05-10 ENCOUNTER — Other Ambulatory Visit: Payer: Self-pay | Admitting: Adult Health

## 2018-05-10 ENCOUNTER — Other Ambulatory Visit (HOSPITAL_COMMUNITY): Payer: Self-pay

## 2018-05-10 DIAGNOSIS — E785 Hyperlipidemia, unspecified: Secondary | ICD-10-CM

## 2018-05-10 DIAGNOSIS — F319 Bipolar disorder, unspecified: Secondary | ICD-10-CM

## 2018-05-10 MED ORDER — TEMAZEPAM 15 MG PO CAPS: 15.0000 mg | ORAL_CAPSULE | Freq: Every evening | ORAL | 0 refills | Status: DC | PRN

## 2018-05-14 DIAGNOSIS — H2513 Age-related nuclear cataract, bilateral: Secondary | ICD-10-CM | POA: Diagnosis not present

## 2018-05-14 DIAGNOSIS — H25013 Cortical age-related cataract, bilateral: Secondary | ICD-10-CM | POA: Diagnosis not present

## 2018-05-14 DIAGNOSIS — H25043 Posterior subcapsular polar age-related cataract, bilateral: Secondary | ICD-10-CM | POA: Diagnosis not present

## 2018-05-14 DIAGNOSIS — I10 Essential (primary) hypertension: Secondary | ICD-10-CM | POA: Diagnosis not present

## 2018-05-14 DIAGNOSIS — H2511 Age-related nuclear cataract, right eye: Secondary | ICD-10-CM | POA: Diagnosis not present

## 2018-05-15 DIAGNOSIS — F411 Generalized anxiety disorder: Secondary | ICD-10-CM | POA: Diagnosis not present

## 2018-05-15 DIAGNOSIS — R69 Illness, unspecified: Secondary | ICD-10-CM | POA: Diagnosis not present

## 2018-05-16 ENCOUNTER — Other Ambulatory Visit (HOSPITAL_COMMUNITY): Payer: Self-pay

## 2018-05-16 DIAGNOSIS — F319 Bipolar disorder, unspecified: Secondary | ICD-10-CM

## 2018-05-16 MED ORDER — ARIPIPRAZOLE ER 400 MG IM PRSY: 400.0000 mg | PREFILLED_SYRINGE | INTRAMUSCULAR | 2 refills | Status: DC

## 2018-05-24 ENCOUNTER — Ambulatory Visit (INDEPENDENT_AMBULATORY_CARE_PROVIDER_SITE_OTHER): Payer: Medicare HMO

## 2018-05-24 DIAGNOSIS — F3130 Bipolar disorder, current episode depressed, mild or moderate severity, unspecified: Secondary | ICD-10-CM | POA: Diagnosis not present

## 2018-05-24 DIAGNOSIS — R69 Illness, unspecified: Secondary | ICD-10-CM | POA: Diagnosis not present

## 2018-05-24 NOTE — Progress Notes (Signed)
Patient in todaywith appropriate affectfor due Abilify Maintena 400 mg IM injection. Patient denied any auditory or visual hallucinations, no suicidal or homicidal ideations and reports mood has been good with no current concerns.Patient's due Abilify Maintena 400 mg IM injection prepared as ordered and administered to patient in hisrightupper outer gluteal area.Patient tolerated due injection without complaint of pain or discomfort and agreed to return in 28days for next due injection. Patient to call if any problems prior to then.

## 2018-05-29 DIAGNOSIS — H25011 Cortical age-related cataract, right eye: Secondary | ICD-10-CM | POA: Diagnosis not present

## 2018-05-29 DIAGNOSIS — H2511 Age-related nuclear cataract, right eye: Secondary | ICD-10-CM | POA: Diagnosis not present

## 2018-05-29 DIAGNOSIS — H25811 Combined forms of age-related cataract, right eye: Secondary | ICD-10-CM | POA: Diagnosis not present

## 2018-05-30 ENCOUNTER — Other Ambulatory Visit (HOSPITAL_COMMUNITY): Payer: Self-pay | Admitting: Psychiatry

## 2018-05-30 DIAGNOSIS — F319 Bipolar disorder, unspecified: Secondary | ICD-10-CM

## 2018-05-30 DIAGNOSIS — H2512 Age-related nuclear cataract, left eye: Secondary | ICD-10-CM | POA: Diagnosis not present

## 2018-06-05 DIAGNOSIS — H25011 Cortical age-related cataract, right eye: Secondary | ICD-10-CM | POA: Diagnosis not present

## 2018-06-05 DIAGNOSIS — H2511 Age-related nuclear cataract, right eye: Secondary | ICD-10-CM | POA: Diagnosis not present

## 2018-06-12 DIAGNOSIS — H2512 Age-related nuclear cataract, left eye: Secondary | ICD-10-CM | POA: Diagnosis not present

## 2018-06-12 DIAGNOSIS — H25812 Combined forms of age-related cataract, left eye: Secondary | ICD-10-CM | POA: Diagnosis not present

## 2018-06-12 DIAGNOSIS — H25012 Cortical age-related cataract, left eye: Secondary | ICD-10-CM | POA: Diagnosis not present

## 2018-06-19 ENCOUNTER — Encounter (HOSPITAL_COMMUNITY): Payer: Self-pay | Admitting: Psychiatry

## 2018-06-19 ENCOUNTER — Ambulatory Visit (INDEPENDENT_AMBULATORY_CARE_PROVIDER_SITE_OTHER): Payer: Medicare HMO | Admitting: Psychiatry

## 2018-06-19 ENCOUNTER — Ambulatory Visit (INDEPENDENT_AMBULATORY_CARE_PROVIDER_SITE_OTHER): Payer: Medicare HMO

## 2018-06-19 ENCOUNTER — Encounter (HOSPITAL_COMMUNITY): Payer: Self-pay

## 2018-06-19 VITALS — BP 118/63 | HR 71 | Ht 68.5 in | Wt 261.0 lb

## 2018-06-19 DIAGNOSIS — R69 Illness, unspecified: Secondary | ICD-10-CM | POA: Diagnosis not present

## 2018-06-19 DIAGNOSIS — F419 Anxiety disorder, unspecified: Secondary | ICD-10-CM

## 2018-06-19 DIAGNOSIS — F319 Bipolar disorder, unspecified: Secondary | ICD-10-CM

## 2018-06-19 DIAGNOSIS — H2512 Age-related nuclear cataract, left eye: Secondary | ICD-10-CM | POA: Diagnosis not present

## 2018-06-19 DIAGNOSIS — H25012 Cortical age-related cataract, left eye: Secondary | ICD-10-CM | POA: Diagnosis not present

## 2018-06-19 MED ORDER — TEMAZEPAM 15 MG PO CAPS: 15.0000 mg | ORAL_CAPSULE | Freq: Every evening | ORAL | 0 refills | Status: DC | PRN

## 2018-06-19 MED ORDER — TRAZODONE HCL 150 MG PO TABS: 150.0000 mg | ORAL_TABLET | Freq: Every day | ORAL | 0 refills | Status: DC

## 2018-06-19 MED ORDER — BENZTROPINE MESYLATE 1 MG PO TABS: 1.0000 mg | ORAL_TABLET | Freq: Every day | ORAL | 0 refills | Status: DC

## 2018-06-19 NOTE — Progress Notes (Signed)
BH MD/PA/NP OP Progress Note  06/19/2018 4:15 PM Sean Hill  MRN:  [id number]  Chief Complaint: I am not sleeping as good.  HPI: Patient came for her follow-up appointment.  He describes his mood is a stable but there are nights when he has difficulty falling asleep.  Lately has been sleeping only 2 to 3 hours and feels tired in the morning.  He denies any paranoia, hallucination or any suicidal thoughts.  He is seeing [name] for CBT.  He has no tremors or shakes.  He is diagnosed with macular degeneration and recently has cataract surgery.  He is not driving and his wife bring him to the doctor's appointment.  He is hoping to get glasses to help his vision.  Patient denies drinking or using any illegal substances.  He does not ask for early refills for his benzos.  Visit Diagnosis:    ICD-10-CM   1. Bipolar I disorder (HCC) F31.9 traZODone (DESYREL) 150 MG tablet    benztropine (COGENTIN) 1 MG tablet    temazepam (RESTORIL) 15 MG capsule    DISCONTINUED: temazepam (RESTORIL) 15 MG capsule    Past Psychiatric History: Viewed. Patient has at least 4-5 psychiatric hospitalization. His last psychiatric admission was in July 2014 . He has history of taking overdose on his medication and mixing with alcohol. He has a history of cutting his wrist. He has taken Tegretol, Depakote, Ambien, Remeron, Vistaril , Geodon, Abilify, lithium, Provigil, Zoloft, Neurontin , Lamictal, Wellbutrin , Risperdal and Prstiq. He tookoverdose on [place]. Patienthadhistory of mania and severe impulsive behavior. He admitted history of buying unnecessary, getting speeding tickets and aggression. Patient reported history of side effects due to polypharmacy.   Past Medical History:  Past Medical History:  Diagnosis Date  . Anxiety   . Arthritis    right wrist; pt. states "everywhere"  . Bipolar disorder ([place])   . Depression   . GERD (gastroesophageal reflux disease)   . History of kidney stones   .  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 Hyperlipidemia   . Hypertension    states under control with med., has been on med. x 1 yr.  . Impaired hearing   . Left bundle branch block (LBBB) on electrocardiogram 10/29/2015  . Macular degeneration (senile) of retina    left    Past Surgical History:  Procedure Laterality Date  . ANKLE FUSION Bilateral   . ANKLE SURGERY Bilateral    ligament surgery  . CARDIAC CATHETERIZATION  x 2   1983; 11/14/2003  . CARPOMETACARPEL SUSPENSION PLASTY Right 09/22/2016   Procedure: right thumb carpometacarpal  ARTHROPLASTY;  Surgeon: Leanora Cover, MD;  Location: [facility];  Service: Orthopedics;  Laterality: Right;  right thumb carpometacarpal  ARTHROPLASTY  . CARPOMETACARPEL SUSPENSION PLASTY Right 11/02/2017   Procedure: RIGHT THUMB SUSPENSIONPLASTY WITH TIGHTROPE, DISTAL POLE SCAPHOID  EXCISION;  Surgeon: Leanora Cover, MD;  Location: [facility];  Service: Orthopedics;  Laterality: Right;  . ELBOW SURGERY Left   . INGUINAL HERNIA REPAIR Right   . OPEN REDUCTION INTERNAL FIXATION (ORIF) DISTAL RADIAL FRACTURE Right 10/29/2015   Procedure: OPEN REDUCTION INTERNAL FIXATION (ORIF) DISTAL RADIAL FRACTURE;  Surgeon: Leanora Cover, MD;  Location: [facility];  Service: Orthopedics;  Laterality: Right;  . ORIF ELBOW FRACTURE Right   . TONSILLECTOMY    . TOTAL KNEE ARTHROPLASTY Bilateral   . ULNAR COLLATERAL LIGAMENT REPAIR Right 12/29/2015   Procedure: REPAIR  RIGHT LATERAL ULNAR COLLATERAL LIGAMENT TEAR  EXTENSOR ORIGIN ;  Surgeon: Leanora Cover, MD;  Location: [facility];  Service: Orthopedics;  Laterality: Right;  . WRIST ARTHROSCOPY WITH DEBRIDEMENT Right 07/14/2016   Procedure: RIGHT WRIST ARTHROSCOPY WITH DEBRIDEMENT  TRIANGULAR FIBROCARTILAGE COMPLEX;  Surgeon: Leanora Cover, MD;  Location: [facility];  Service: Orthopedics;  Laterality: Right;    Family Psychiatric  History: Reviewed  Family History:  Family History  Problem Relation Age of Onset  . Alcohol abuse Mother   . Ovarian cancer Mother   . Alcohol abuse Father   . Pancreatic cancer Father   . Depression Daughter   . Paranoid behavior Daughter   . Hyperlipidemia Brother     Social History:  Social History   Socioeconomic History  . Marital status: Married    Spouse name: Not on file  . Number of children: Not on file  . Years of education: Not on file  . Highest education level: Not on file  Occupational History  . Not on file  Social Needs  . Financial resource strain: Not on file  . Food insecurity:    Worry: Not on file    Inability: Not on file  . Transportation needs:    Medical: Not on file    Non-medical: Not on file  Tobacco Use  . Smoking status: Never Smoker  . Smokeless tobacco: Never Used  Substance and Sexual Activity  . Alcohol use: Yes    Comment: occasionally  . Drug use: No  . Sexual activity: Not Currently    Birth control/protection: None  Lifestyle  . Physical activity:    Days per week: Not on file    Minutes per session: Not on file  . Stress: Not on file  Relationships  . Social connections:    Talks on phone: Not on file    Gets together: Not on file    Attends religious service: Not on file    Active member of club or organization: Not on file    Attends meetings of clubs or organizations: Not on file    Relationship status: Not on file  Other Topics Concern  . Not on file  Social History Narrative   Married, lives in [place].  Retired Dance movement psychotherapist.   Two daughters, both married,3 grandchildren ( 21, 80, 1 year)    No regular exercise.  Never smoker.  No ETOH.  No drugs.    Allergies:  Allergies  Allergen Reactions  . Penicillins Hives    BLISTERS  . Codeine Rash    Metabolic Disorder Labs: Lab Results  Component Value Date   HGBA1C 6.1 09/21/2017   MPG 123 (H) 12/03/2014   No results found for: PROLACTIN Lab  Results  Component Value Date   CHOL 133 09/21/2017   TRIG 276.0 (H) 09/21/2017   HDL 30.80 (L) 09/21/2017   CHOLHDL 4 09/21/2017   VLDL 55.2 (H) 09/21/2017   Lab Results  Component Value Date   TSH 2.30 09/21/2017   TSH 2.09 08/18/2016    Therapeutic Level Labs: Lab Results  Component Value Date   LITHIUM 0.15 (L) 12/04/2012   LITHIUM 0.40 (L) 11/14/2012   No results found for: VALPROATE No components found for:  CBMZ  Current Medications: Current Outpatient Medications  Medication Sig Dispense Refill  . ARIPiprazole ER (ABILIFY MAINTENA) 400 MG PRSY prefilled syringe Inject 400 mg into the muscle every 28 (twenty-eight) days. 1 each 2  . aspirin 81 MG  tablet Take 81 mg by mouth daily.    Marland Kitchen atorvastatin (LIPITOR) 40 MG tablet TAKE 1 TABLET BY MOUTH EVERY DAY 90 tablet 3  . benztropine (COGENTIN) 1 MG tablet Take 1 tablet (1 mg total) by mouth daily. 90 tablet 0  . fenofibrate (TRICOR) 145 MG tablet Take 1 tablet (145 mg total) by mouth daily. 90 tablet 3  . lisinopril (PRINIVIL,ZESTRIL) 20 MG tablet TAKE 1 TABLET (20 MG TOTAL) BY MOUTH DAILY. 90 tablet 3  . Multiple Vitamin (MULTIVITAMIN WITH MINERALS) TABS tablet Take 1 tablet by mouth daily.    Marland Kitchen nystatin (NYSTATIN) powder Apply topically 4 (four) times daily. 15 g 0  . Omega-3 Fatty Acids (FISH OIL PO) Take by mouth.    Marland Kitchen omeprazole (PRILOSEC) 40 MG capsule Take 1 capsule (40 mg total) by mouth 2 (two) times daily. 180 capsule 1  . temazepam (RESTORIL) 15 MG capsule Take 1 capsule (15 mg total) by mouth at bedtime as needed. for sleep 90 capsule 0  . traZODone (DESYREL) 100 MG tablet Take 1/2 to 1 tab at bed time 90 tablet 0  . Vitamin D, Cholecalciferol, 1000 UNITS TABS Take 1 tablet by mouth daily.    . benzonatate (TESSALON) 200 MG capsule Take 1 capsule (200 mg total) by mouth 2 (two) times daily as needed for cough. (Patient not taking: Reported on 06/19/2018) 20 capsule 0  . doxycycline (VIBRAMYCIN) 100 MG capsule  Take 1 capsule (100 mg total) by mouth 2 (two) times daily. (Patient not taking: Reported on 06/19/2018) 20 capsule 0  . fluticasone (FLONASE) 50 MCG/ACT nasal spray SPRAY 2 SPRAYS INTO EACH NOSTRIL EVERY DAY (Patient not taking: Reported on 06/19/2018) 48 g 1   Current Facility-Administered Medications  Medication Dose Route Frequency Provider Last Rate Last Dose  . ARIPiprazole ER (ABILIFY MAINTENA) injection 400 mg  400 mg Intramuscular Q28 days [name], MD   400 mg at 05/24/18 1608  . ipratropium-albuterol (DUONEB) 0.5-2.5 (3) MG/3ML nebulizer solution 3 mL  3 mL Nebulization Once Nafziger, Tommi Rumps, NP         Musculoskeletal: Strength & Muscle Tone: within normal limits Gait & Station: normal Patient leans: N/A  Psychiatric Specialty Exam: ROS  Blood pressure 118/63, pulse 71, height 5' 8.5" (1.74 m), weight 261 lb (118.4 kg), SpO2 93 %.There is no height or weight on file to calculate BMI.  General Appearance: Casual  Eye Contact:  Good  Speech:  Clear and Coherent  Volume:  Normal  Mood:  Euthymic  Affect:  Congruent  Thought Process:  Goal Directed  Orientation:  Full (Time, Place, and Person)  Thought Content: Logical   Suicidal Thoughts:  No  Homicidal Thoughts:  No  Memory:  Immediate;   Good Recent;   Good Remote;   Good  Judgement:  Good  Insight:  Good  Psychomotor Activity:  Normal  Concentration:  Concentration: Fair and Attention Span: Fair  Recall:  Good  Fund of Knowledge: Good  Language: Good  Akathisia:  No  Handed:  Right  AIMS (if indicated): not done  Assets:  Communication Skills Desire for Improvement Housing Resilience Social Support  ADL's:  Intact  Cognition: WNL  Sleep:  Poor   Screenings: AUDIT     Admission (Discharged) from OP Visit from 12/04/2012 in [facility]  Alcohol Use Disorder Identification Test Final Score (AUDIT)  1    PHQ2-9     Office Visit from 09/21/2017 in [facility]  at  [place] from 02/23/2015 in [facility] Office Visit from 01/02/2015 in [facility] [place] from 12/02/2013 in [facility] at [facility] Total Score  0  1  0  4       Assessment and Plan: Bipolar disorder type I.  Anxiety disorder NOS.  Discussed to try increasing trazodone 250 mg at bedtime.  Continue Cogentin 1 mg at bedtime, Celexa 20 mg daily, temazepam 50 mg at bedtime and Abilify injection 4 mg intramuscular every 4 weeks.  If patient do not see improvement in his sleep then we will try increasing his temazepam.  Encouraged to continue therapy with [facility].  Patient is scheduled to have his Abilify injection today.  Recommended to call us back if is any question or any concern.  Follow-up in 3 months.   [name], MD 06/19/2018, 4:15 PM

## 2018-06-19 NOTE — Progress Notes (Signed)
Met with patient, after evaluation with Dr. Adele Schilder today, as he requested his Abilify Maintena 400 mg IM monthly injection a few days early, stating he has a lot of eye appointments this week and would like to go ahead and get his injection today so he does not have to return later this week.  Met with Dr. Adele Schilder who requested this nurse go ahead and give injection this date a few days early.  Patient's Abilify Maintena 400 mg IM injection then prepared as ordered and given to patient in his right upper outer gluteal area.  Patient tolerated injection without any complaint of pain or discomfort and agreed to return in 4 weeks for next due injection.  Patient denied any suicidal or homicidal ideations, no auditory or visual hallucinations and no plan or intent to harm self or others.  Patient stated his mood has remained improved and mostly level since going on Abilify Maintena an no problems with the medications or side effects at this time.  Patient scheduled next return injection visit for the coming month and will call if any problems prior till then.

## 2018-06-20 ENCOUNTER — Other Ambulatory Visit (HOSPITAL_COMMUNITY): Payer: Self-pay | Admitting: Psychiatry

## 2018-06-20 DIAGNOSIS — H353211 Exudative age-related macular degeneration, right eye, with active choroidal neovascularization: Secondary | ICD-10-CM | POA: Diagnosis not present

## 2018-06-20 DIAGNOSIS — F319 Bipolar disorder, unspecified: Secondary | ICD-10-CM

## 2018-06-21 DIAGNOSIS — F411 Generalized anxiety disorder: Secondary | ICD-10-CM | POA: Diagnosis not present

## 2018-06-21 DIAGNOSIS — R69 Illness, unspecified: Secondary | ICD-10-CM | POA: Diagnosis not present

## 2018-07-04 DIAGNOSIS — Z0100 Encounter for examination of eyes and vision without abnormal findings: Secondary | ICD-10-CM | POA: Diagnosis not present

## 2018-07-06 DIAGNOSIS — Z0100 Encounter for examination of eyes and vision without abnormal findings: Secondary | ICD-10-CM | POA: Diagnosis not present

## 2018-07-19 ENCOUNTER — Ambulatory Visit (INDEPENDENT_AMBULATORY_CARE_PROVIDER_SITE_OTHER): Payer: Medicare HMO

## 2018-07-19 DIAGNOSIS — F3170 Bipolar disorder, currently in remission, most recent episode unspecified: Secondary | ICD-10-CM | POA: Diagnosis not present

## 2018-07-19 DIAGNOSIS — R69 Illness, unspecified: Secondary | ICD-10-CM | POA: Diagnosis not present

## 2018-07-19 NOTE — Progress Notes (Signed)
Patient presents today with flat affect and good mood for his monthly injection.Patient's Abilify Maintena 400 mg IM injectionwasprepared as ordered and given to patient in hisleftupper outer gluteal area. Patient tolerated injection without any complaint of pain or discomfort and agreed to return in 4 weeks for next due injection. Patient denied any suicidal or homicidal ideations, no auditory or visual hallucinations and no plan or intent to harm self or others. Patient stated his mood has remained improved and mostly level since going on Abilify Maintena an no problems with the medications or side effects at this time. Patient scheduled next return injection visit for the coming month and will call if any problems prior till then 

## 2018-08-01 DIAGNOSIS — H353211 Exudative age-related macular degeneration, right eye, with active choroidal neovascularization: Secondary | ICD-10-CM | POA: Diagnosis not present

## 2018-08-01 DIAGNOSIS — H353231 Exudative age-related macular degeneration, bilateral, with active choroidal neovascularization: Secondary | ICD-10-CM | POA: Diagnosis not present

## 2018-08-02 ENCOUNTER — Other Ambulatory Visit (HOSPITAL_COMMUNITY): Payer: Self-pay | Admitting: Psychiatry

## 2018-08-02 DIAGNOSIS — F319 Bipolar disorder, unspecified: Secondary | ICD-10-CM

## 2018-08-20 ENCOUNTER — Ambulatory Visit (INDEPENDENT_AMBULATORY_CARE_PROVIDER_SITE_OTHER): Payer: Medicare HMO

## 2018-08-20 DIAGNOSIS — R69 Illness, unspecified: Secondary | ICD-10-CM | POA: Diagnosis not present

## 2018-08-20 DIAGNOSIS — F3170 Bipolar disorder, currently in remission, most recent episode unspecified: Secondary | ICD-10-CM

## 2018-08-20 NOTE — Progress Notes (Signed)
Patient presents today with flat affect and good mood for his monthly injection.Patient's Abilify Maintena 400 mg IM injectionwasprepared as ordered and given to patient in hisright upper outer gluteal area. Patient tolerated injection without any complaint of pain or discomfort and agreed to return in 4 weeks for next due injection. Patient denied any suicidal or homicidal ideations, no auditory or visual hallucinations and no plan or intent to harm self or others. Patient stated his mood has remained improved and mostly level since going on Abilify Maintena an no problems with the medications or side effects at this time. Patient scheduled next return injection visit for the coming month and will call if any problems prior till then. 

## 2018-08-22 DIAGNOSIS — F411 Generalized anxiety disorder: Secondary | ICD-10-CM | POA: Diagnosis not present

## 2018-08-22 DIAGNOSIS — R69 Illness, unspecified: Secondary | ICD-10-CM | POA: Diagnosis not present

## 2018-09-10 ENCOUNTER — Ambulatory Visit (HOSPITAL_COMMUNITY): Payer: Medicare HMO | Admitting: Psychiatry

## 2018-09-16 ENCOUNTER — Other Ambulatory Visit (HOSPITAL_COMMUNITY): Payer: Self-pay | Admitting: Psychiatry

## 2018-09-16 DIAGNOSIS — F319 Bipolar disorder, unspecified: Secondary | ICD-10-CM

## 2018-09-20 ENCOUNTER — Ambulatory Visit (INDEPENDENT_AMBULATORY_CARE_PROVIDER_SITE_OTHER): Payer: Medicare HMO | Admitting: Psychiatry

## 2018-09-20 ENCOUNTER — Encounter (HOSPITAL_COMMUNITY): Payer: Self-pay | Admitting: Psychiatry

## 2018-09-20 ENCOUNTER — Ambulatory Visit (INDEPENDENT_AMBULATORY_CARE_PROVIDER_SITE_OTHER): Payer: Medicare HMO

## 2018-09-20 VITALS — BP 126/62 | HR 75 | Ht 70.0 in | Wt 261.0 lb

## 2018-09-20 DIAGNOSIS — F319 Bipolar disorder, unspecified: Secondary | ICD-10-CM | POA: Diagnosis not present

## 2018-09-20 DIAGNOSIS — F3131 Bipolar disorder, current episode depressed, mild: Secondary | ICD-10-CM | POA: Diagnosis not present

## 2018-09-20 DIAGNOSIS — F411 Generalized anxiety disorder: Secondary | ICD-10-CM | POA: Diagnosis not present

## 2018-09-20 DIAGNOSIS — R69 Illness, unspecified: Secondary | ICD-10-CM | POA: Diagnosis not present

## 2018-09-20 MED ORDER — TEMAZEPAM 30 MG PO CAPS: 30.0000 mg | ORAL_CAPSULE | Freq: Every evening | ORAL | 0 refills | Status: DC | PRN

## 2018-09-20 MED ORDER — ARIPIPRAZOLE ER 400 MG IM PRSY: 400.0000 mg | PREFILLED_SYRINGE | INTRAMUSCULAR | 2 refills | Status: DC

## 2018-09-20 MED ORDER — CITALOPRAM HYDROBROMIDE 20 MG PO TABS: 20.0000 mg | ORAL_TABLET | Freq: Every day | ORAL | 0 refills | Status: DC

## 2018-09-20 MED ORDER — TRAZODONE HCL 100 MG PO TABS: 100.0000 mg | ORAL_TABLET | Freq: Every day | ORAL | 0 refills | Status: DC

## 2018-09-20 MED ORDER — BENZTROPINE MESYLATE 1 MG PO TABS: 1.0000 mg | ORAL_TABLET | Freq: Every day | ORAL | 0 refills | Status: DC

## 2018-09-20 NOTE — Progress Notes (Signed)
[facility] MD/PA/NP OP Progress Note  09/20/2018 3:31 PM Sean Hill  MRN:  [id number]  Chief Complaint: I am still not sleeping as good.  HPI: Sean Hill came for his follow-up appointment.  On his last visit we increase trazodone from 100 mg 250 mg but he did not see any improvement.  He continued to have racing thoughts and difficulty sleeping.  However his paranoia, hallucination and mood has been stable.  He has no recent manic symptoms.  He denies any suicidal thoughts.  He is seeing [name] every 2 months.  He is getting Abilify injection on a regular basis.  He had a good Christmas.  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  Patient denies drinking or using any illegal substances.  His energy level is okay.  He still have dry mouth but he does not want to change his Abilify.  Visit Diagnosis:    ICD-10-CM   1. GAD (generalized anxiety disorder) F41.1 citalopram (CELEXA) 20 MG tablet  2. Bipolar I disorder (HCC) F31.9 traZODone (DESYREL) 100 MG tablet    temazepam (RESTORIL) 30 MG capsule    citalopram (CELEXA) 20 MG tablet    benztropine (COGENTIN) 1 MG tablet    ARIPiprazole ER (ABILIFY MAINTENA) 400 MG PRSY prefilled syringe    Past Psychiatric History: Reviewed. History of multiple hospitalization.  Last hospitalization was in July 2014.  Took overdose on his medication with alcohol.  History of cutting his wrist.  Took Tegretol, Depakote, Ambien, Remeron, Vistaril, Geodon, Abilify, lithium, Provigil, Zoloft, Neurontin, Lamictal, Wellbutrin, Risperdal and Pristiq.  Took overdose on [place].  History of mania, aggression, getting speeding tickets, excessive buying and severe impulsive behavior.  Past Medical History:  Past Medical History:  Diagnosis Date  . Anxiety   . Arthritis    right wrist; pt. states "everywhere"  . Bipolar disorder ([place])   . Depression   . GERD (gastroesophageal reflux disease)   . History of kidney stones   . History of MRSA infection     left knee after arthroplasty  . Hyperlipidemia   . Hypertension    states under control with med., has been on med. x 1 yr.  . Impaired hearing   . Left bundle branch block (LBBB) on electrocardiogram 10/29/2015  . Macular degeneration (senile) of retina    left    Past Surgical History:  Procedure Laterality Date  . ANKLE FUSION Bilateral   . ANKLE SURGERY Bilateral    ligament surgery  . CARDIAC CATHETERIZATION  x 2   1983; 11/14/2003  . CARPOMETACARPEL SUSPENSION PLASTY Right 09/22/2016   Procedure: right thumb carpometacarpal  ARTHROPLASTY;  Surgeon: Leanora Cover, MD;  Location: [facility];  Service: Orthopedics;  Laterality: Right;  right thumb carpometacarpal  ARTHROPLASTY  . CARPOMETACARPEL SUSPENSION PLASTY Right 11/02/2017   Procedure: RIGHT THUMB SUSPENSIONPLASTY WITH TIGHTROPE, DISTAL POLE SCAPHOID  EXCISION;  Surgeon: Leanora Cover, MD;  Location: [facility];  Service: Orthopedics;  Laterality: Right;  . ELBOW SURGERY Left   . INGUINAL HERNIA REPAIR Right   . OPEN REDUCTION INTERNAL FIXATION (ORIF) DISTAL RADIAL FRACTURE Right 10/29/2015   Procedure: OPEN REDUCTION INTERNAL FIXATION (ORIF) DISTAL RADIAL FRACTURE;  Surgeon: Leanora Cover, MD;  Location: [facility];  Service: Orthopedics;  Laterality: Right;  . ORIF ELBOW FRACTURE Right   . TONSILLECTOMY    . TOTAL KNEE ARTHROPLASTY Bilateral   . ULNAR COLLATERAL LIGAMENT REPAIR Right 12/29/2015   Procedure: REPAIR  RIGHT LATERAL ULNAR COLLATERAL LIGAMENT TEAR  EXTENSOR ORIGIN ;  Surgeon: Leanora Cover, MD;  Location: [facility];  Service: Orthopedics;  Laterality: Right;  . WRIST ARTHROSCOPY WITH DEBRIDEMENT Right 07/14/2016   Procedure: RIGHT WRIST ARTHROSCOPY WITH DEBRIDEMENT  TRIANGULAR FIBROCARTILAGE COMPLEX;  Surgeon: Leanora Cover, MD;  Location: [facility];  Service: Orthopedics;  Laterality: Right;    Family Psychiatric History: Reviewed.  Family  History:  Family History  Problem Relation Age of Onset  . Alcohol abuse Mother   . Ovarian cancer Mother   . Alcohol abuse Father   . Pancreatic cancer Father   . Depression Daughter   . Paranoid behavior Daughter   . Hyperlipidemia Brother     Social History:  Social History   Socioeconomic History  . Marital status: Married    Spouse name: Not on file  . Number of children: Not on file  . Years of education: Not on file  . Highest education level: Not on file  Occupational History  . Not on file  Social Needs  . Financial resource strain: Not on file  . Food insecurity:    Worry: Not on file    Inability: Not on file  . Transportation needs:    Medical: Not on file    Non-medical: Not on file  Tobacco Use  . Smoking status: Never Smoker  . Smokeless tobacco: Never Used  Substance and Sexual Activity  . Alcohol use: Yes    Comment: occasionally  . Drug use: No  . Sexual activity: Not Currently    Birth control/protection: None  Lifestyle  . Physical activity:    Days per week: Not on file    Minutes per session: Not on file  . Stress: Not on file  Relationships  . Social connections:    Talks on phone: Not on file    Gets together: Not on file    Attends religious service: Not on file    Active member of club or organization: Not on file    Attends meetings of clubs or organizations: Not on file    Relationship status: Not on file  Other Topics Concern  . Not on file  Social History Narrative   Married, lives in [place].  Retired Dance movement psychotherapist.   Two daughters, both married,3 grandchildren ( 46, 61, 1 year)    No regular exercise.  Never smoker.  No ETOH.  No drugs.    Allergies:  Allergies  Allergen Reactions  . Penicillins Hives    BLISTERS  . Codeine Rash    Metabolic Disorder Labs: Lab Results  Component Value Date   HGBA1C 6.1 09/21/2017   MPG 123 (H) 12/03/2014   No results found for: PROLACTIN Lab Results  Component Value Date    CHOL 133 09/21/2017   TRIG 276.0 (H) 09/21/2017   HDL 30.80 (L) 09/21/2017   CHOLHDL 4 09/21/2017   VLDL 55.2 (H) 09/21/2017   Lab Results  Component Value Date   TSH 2.30 09/21/2017   TSH 2.09 08/18/2016    Therapeutic Level Labs: Lab Results  Component Value Date   LITHIUM 0.15 (L) 12/04/2012   LITHIUM 0.40 (L) 11/14/2012   No results found for: VALPROATE No components found for:  CBMZ  Current Medications: Current Outpatient Medications  Medication Sig Dispense Refill  . ARIPiprazole ER (ABILIFY MAINTENA) 400 MG PRSY prefilled syringe Inject 400 mg into the muscle every 28 (twenty-eight) days. 1 each 2  . aspirin 81 MG  tablet Take 81 mg by mouth daily.    Marland Kitchen atorvastatin (LIPITOR) 40 MG tablet TAKE 1 TABLET BY MOUTH EVERY DAY 90 tablet 3  . benztropine (COGENTIN) 1 MG tablet Take 1 tablet (1 mg total) by mouth daily. 90 tablet 0  . citalopram (CELEXA) 20 MG tablet TAKE 1 TABLET BY MOUTH EVERY DAY 90 tablet 0  . fenofibrate (TRICOR) 145 MG tablet Take 1 tablet (145 mg total) by mouth daily. 90 tablet 3  . lisinopril (PRINIVIL,ZESTRIL) 20 MG tablet TAKE 1 TABLET (20 MG TOTAL) BY MOUTH DAILY. 90 tablet 3  . Multiple Vitamin (MULTIVITAMIN WITH MINERALS) TABS tablet Take 1 tablet by mouth daily.    Marland Kitchen nystatin (NYSTATIN) powder Apply topically 4 (four) times daily. 15 g 0  . Omega-3 Fatty Acids (FISH OIL PO) Take by mouth.    Marland Kitchen omeprazole (PRILOSEC) 40 MG capsule Take 1 capsule (40 mg total) by mouth 2 (two) times daily. 180 capsule 1  . temazepam (RESTORIL) 15 MG capsule Take 1 capsule (15 mg total) by mouth at bedtime as needed. for sleep 90 capsule 0  . traZODone (DESYREL) 150 MG tablet Take 1 tablet (150 mg total) by mouth at bedtime. 90 tablet 0  . Vitamin D, Cholecalciferol, 1000 UNITS TABS Take 1 tablet by mouth daily.     Current Facility-Administered Medications  Medication Dose Route Frequency Provider Last Rate Last Dose  . ARIPiprazole ER (ABILIFY MAINTENA) injection  400 mg  400 mg Intramuscular Q28 days [name], MD   400 mg at 09/20/18 1516  . ipratropium-albuterol (DUONEB) 0.5-2.5 (3) MG/3ML nebulizer solution 3 mL  3 mL Nebulization Once Nafziger, Tommi Rumps, NP         Musculoskeletal: Strength & Muscle Tone: within normal limits Gait & Station: normal Patient leans: N/A  Psychiatric Specialty Exam: ROS  Blood pressure 126/62, pulse 75, height 5\' 10"  (1.778 m), weight 261 lb (118.4 kg).Body mass index is 37.45 kg/m.  General Appearance: Casual  Eye Contact:  Good  Speech:  Normal Rate  Volume:  Normal  Mood:  Euthymic  Affect:  Appropriate  Thought Process:  Goal Directed  Orientation:  Full (Time, Place, and Person)  Thought Content: Logical   Suicidal Thoughts:  No  Homicidal Thoughts:  No  Memory:  Immediate;   Good Recent;   Fair Remote;   Good  Judgement:  Good  Insight:  Good  Psychomotor Activity:  Normal  Concentration:  Concentration: Fair and Attention Span: Fair  Recall:  Good  Fund of Knowledge: Good  Language: Good  Akathisia:  No  Handed:  Right  AIMS (if indicated): not done  Assets:  Communication Skills Des[facility] for [place] Talents/Skills  ADL's:  Intact  Cognition: WNL  Sleep:  Poor   Screenings: AUDIT     Admission (Discharged) from OP Visit from 12/04/2012 in [facility]  Alcohol Use Disorder Identification Test Final Score (AUDIT)  1    PHQ2-9     Office Visit from 09/21/2017 in [facility] from 02/23/2015 in [facility] Office Visit from 01/02/2015 in [facility] from 12/02/2013 in [facility]  PHQ-2 Total Score  0  1  0  4       Assessment and Plan: Bipolar disorder type I.  Generalized anxiety disorder.  Discussed to try temazepam 30 mg at bedtime and reduce trazodone back to 100  mg.  Explained  that reducing trazodone may help the dry mouth.  Continue Abilify injection 400 mg intramuscular every 4 weeks, Celexa 20 mg daily and Cogentin 1 mg at bedtime.  Continue therapy with [name] for CBT.  Discussed medication side effects and benefits.  Recommended to call us back if he has any question, concern or any worsening of symptoms.  If patient continues to have insomnia we will consider Klonopin which he took in the past with good results but it was discontinued on his last admission.  I will see him again in 3 months.   [name], MD 09/20/2018, 3:31 PM

## 2018-09-20 NOTE — Progress Notes (Signed)
Patient presents today with flat affect and good mood for his monthly injection.Patient's Abilify Maintena 400 mg IM injectionwasprepared as ordered and given to patient in hisleftupper outer gluteal area. Patient tolerated injection without any complaint of pain or discomfort and agreed to return in 4 weeks for next due injection. Patient denied any suicidal or homicidal ideations, no auditory or visual hallucinations and no plan or intent to harm self or others. Patient stated his mood has remained improved and mostly level since going on Abilify Maintena an no problems with the medications or side effects at this time. Patient scheduled next return injection visit for the coming month and will call if any problems prior till then 

## 2018-09-21 DIAGNOSIS — H353231 Exudative age-related macular degeneration, bilateral, with active choroidal neovascularization: Secondary | ICD-10-CM | POA: Diagnosis not present

## 2018-10-05 DIAGNOSIS — R69 Illness, unspecified: Secondary | ICD-10-CM | POA: Diagnosis not present

## 2018-10-05 DIAGNOSIS — F411 Generalized anxiety disorder: Secondary | ICD-10-CM | POA: Diagnosis not present

## 2018-10-06 ENCOUNTER — Other Ambulatory Visit: Payer: Self-pay | Admitting: Adult Health

## 2018-10-06 DIAGNOSIS — I10 Essential (primary) hypertension: Secondary | ICD-10-CM

## 2018-10-11 MED ORDER — LISINOPRIL 20 MG PO TABS: 20.0000 mg | ORAL_TABLET | Freq: Every day | ORAL | 3 refills | Status: DC

## 2018-10-11 NOTE — Addendum Note (Signed)
Addended by: [name] on: 10/11/2018 08:03 AM   Modules accepted: Orders

## 2018-10-24 ENCOUNTER — Other Ambulatory Visit: Payer: Self-pay | Admitting: Adult Health

## 2018-10-24 ENCOUNTER — Ambulatory Visit (INDEPENDENT_AMBULATORY_CARE_PROVIDER_SITE_OTHER): Payer: Medicare HMO

## 2018-10-24 ENCOUNTER — Encounter: Payer: Self-pay | Admitting: Family Medicine

## 2018-10-24 DIAGNOSIS — F3131 Bipolar disorder, current episode depressed, mild: Secondary | ICD-10-CM | POA: Diagnosis not present

## 2018-10-24 DIAGNOSIS — K219 Gastro-esophageal reflux disease without esophagitis: Secondary | ICD-10-CM

## 2018-10-24 DIAGNOSIS — R69 Illness, unspecified: Secondary | ICD-10-CM | POA: Diagnosis not present

## 2018-10-24 NOTE — Telephone Encounter (Signed)
Sent to the pharmacy by e-scribe. 

## 2018-10-24 NOTE — Progress Notes (Signed)
Patient presents today with flat affect and good mood for his monthly injection.Patient's Abilify Maintena 400 mg IM injectionwasprepared as ordered and given to patient in hisright upper outer gluteal area. Patient tolerated injection without any complaint of pain or discomfort and agreed to return in 4 weeks for next due injection. Patient denied any suicidal or homicidal ideations, no auditory or visual hallucinations and no plan or intent to harm self or others. Patient stated his mood has remained improved and mostly level since going on Abilify Maintena an no problems with the medications or side effects at this time. Patient scheduled next return injection visit for the coming month and will call if any problems prior till then. 

## 2018-11-01 DIAGNOSIS — F411 Generalized anxiety disorder: Secondary | ICD-10-CM | POA: Diagnosis not present

## 2018-11-01 DIAGNOSIS — R69 Illness, unspecified: Secondary | ICD-10-CM | POA: Diagnosis not present

## 2018-11-07 ENCOUNTER — Ambulatory Visit (INDEPENDENT_AMBULATORY_CARE_PROVIDER_SITE_OTHER): Payer: Medicare HMO | Admitting: Adult Health

## 2018-11-07 ENCOUNTER — Ambulatory Visit (INDEPENDENT_AMBULATORY_CARE_PROVIDER_SITE_OTHER): Payer: Medicare HMO

## 2018-11-07 ENCOUNTER — Encounter: Payer: Self-pay | Admitting: Adult Health

## 2018-11-07 VITALS — BP 116/70 | Temp 98.1°F | Ht 69.0 in | Wt 260.0 lb

## 2018-11-07 DIAGNOSIS — R079 Chest pain, unspecified: Secondary | ICD-10-CM | POA: Diagnosis not present

## 2018-11-07 DIAGNOSIS — K219 Gastro-esophageal reflux disease without esophagitis: Secondary | ICD-10-CM | POA: Diagnosis not present

## 2018-11-07 DIAGNOSIS — Z125 Encounter for screening for malignant neoplasm of prostate: Secondary | ICD-10-CM | POA: Diagnosis not present

## 2018-11-07 DIAGNOSIS — E7849 Other hyperlipidemia: Secondary | ICD-10-CM

## 2018-11-07 DIAGNOSIS — F3131 Bipolar disorder, current episode depressed, mild: Secondary | ICD-10-CM

## 2018-11-07 DIAGNOSIS — Z0000 Encounter for general adult medical examination without abnormal findings: Secondary | ICD-10-CM

## 2018-11-07 DIAGNOSIS — R69 Illness, unspecified: Secondary | ICD-10-CM | POA: Diagnosis not present

## 2018-11-07 DIAGNOSIS — R0602 Shortness of breath: Secondary | ICD-10-CM | POA: Diagnosis not present

## 2018-11-07 DIAGNOSIS — I10 Essential (primary) hypertension: Secondary | ICD-10-CM | POA: Diagnosis not present

## 2018-11-07 DIAGNOSIS — Z1211 Encounter for screening for malignant neoplasm of colon: Secondary | ICD-10-CM

## 2018-11-07 LAB — COMPREHENSIVE METABOLIC PANEL
ALT: 20 U/L (ref 0–53)
AST: 28 U/L (ref 0–37)
Albumin: 4.1 g/dL (ref 3.5–5.2)
Alkaline Phosphatase: 64 U/L (ref 39–117)
BUN: 15 mg/dL (ref 6–23)
CO2: 26 mEq/L (ref 19–32)
Calcium: 9.1 mg/dL (ref 8.4–10.5)
Chloride: 105 mEq/L (ref 96–112)
Creatinine, Ser: 1.16 mg/dL (ref 0.40–1.50)
GFR: 61.23 mL/min (ref >60.00)
GLUCOSE: 95 mg/dL (ref 70–99)
Potassium: 4.4 mEq/L (ref 3.5–5.1)
Sodium: 140 mEq/L (ref 135–145)
Total Bilirubin: 0.6 mg/dL (ref 0.2–1.2)
Total Protein: 6.3 g/dL (ref 6.0–8.3)

## 2018-11-07 LAB — LIPID PANEL
Cholesterol: 116 mg/dL (ref 0–200)
HDL: 29.2 mg/dL — ABNORMAL LOW (ref >39.00)
NonHDL: 87.06
Total CHOL/HDL Ratio: 4
Triglycerides: 252 mg/dL — ABNORMAL HIGH (ref 0.0–149.0)
VLDL: 50.4 mg/dL — ABNORMAL HIGH (ref 0.0–40.0)

## 2018-11-07 LAB — CBC WITH DIFFERENTIAL/PLATELET
BASOS ABS: 0.1 10*3/uL (ref 0.0–0.1)
BASOS PCT: 0.9 % (ref 0.0–3.0)
Eosinophils Absolute: 0.2 10*3/uL (ref 0.0–0.7)
Eosinophils Relative: 2.7 % (ref 0.0–5.0)
HCT: 43.2 % (ref 39.0–52.0)
Hemoglobin: 14.6 g/dL (ref 13.0–17.0)
LYMPHS ABS: 2 10*3/uL (ref 0.7–4.0)
Lymphocytes Relative: 34.7 % (ref 12.0–46.0)
MCHC: 33.7 g/dL (ref 30.0–36.0)
MCV: 84.7 fl (ref 78.0–100.0)
Monocytes Absolute: 0.4 10*3/uL (ref 0.1–1.0)
Monocytes Relative: 7.1 % (ref 3.0–12.0)
NEUTROS ABS: 3.1 10*3/uL (ref 1.4–7.7)
NEUTROS PCT: 54.6 % (ref 43.0–77.0)
PLATELETS: 240 10*3/uL (ref 150.0–400.0)
RBC: 5.1 Mil/uL (ref 4.22–5.81)
RDW: 14.8 % (ref 11.5–15.5)
WBC: 5.6 10*3/uL (ref 4.0–10.5)

## 2018-11-07 LAB — LDL CHOLESTEROL, DIRECT: Direct LDL: 65 mg/dL

## 2018-11-07 LAB — PSA: PSA: 0.21 ng/mL (ref 0.10–4.00)

## 2018-11-07 LAB — TSH: TSH: 1.69 u[IU]/mL (ref 0.35–4.50)

## 2018-11-07 LAB — HEMOGLOBIN A1C: Hgb A1c MFr Bld: 6.1 % (ref 4.6–6.5)

## 2018-11-07 MED ORDER — AMLODIPINE BESYLATE 2.5 MG PO TABS: 2.5000 mg | ORAL_TABLET | Freq: Every day | ORAL | 1 refills | Status: DC

## 2018-11-07 NOTE — Progress Notes (Signed)
Subjective:    Patient ID: Sean Hill, male    DOB: 23-Dec-1940, 78 y.o.   MRN: [id number]  HPI  Patient presents for yearly preventative medicine examination. He is a pleasant 78 year old male who  has a past medical history of Anxiety, Arthritis, Bipolar disorder ([place]), Depression, GERD (gastroesophageal reflux disease), History of kidney stones, History of MRSA infection, Hyperlipidemia, Hypertension, Impaired hearing, Left bundle branch block (LBBB) on electrocardiogram (10/29/2015), and Macular degeneration (senile) of retina.   Hyperlipidemia -Lipitor, TriCor and baby aspirin.  Denies myalgias Lab Results  Component Value Date   CHOL 133 09/21/2017   HDL 30.80 (L) 09/21/2017   LDLDIRECT 79.0 09/21/2017   TRIG 276.0 (H) 09/21/2017   CHOLHDL 4 09/21/2017   H/o Depression and Bipolar -he is currently main obtained on Cogentin, Lexapro, trazodone, and Abilify injections.  He is followed by psychiatry.  Reports that his mood is stable.  Denies hallucinations, depression, suicidal ideation, or irritability  Insomnia - Takes Restoril controlled   Hypertension -controlled with lisinopril 20 mg. He does report a dry cough that has been present for sometime but he has never brought it up before.  BP Readings from Last 3 Encounters:  04/27/18 118/70  11/02/17 135/84  09/21/17 130/60   GERD - takes Prilosec 40 mg BID - feels controlled.   Colon Cancer Screen - had a positive cologuard in 2016 but refused a colonoscopy at this time. He will do a colonoscopy now.   Shortness of breath - has been present for about 6 months. Reports dyspnea with exertion and at rest. Is able to climb stairs most times without becoming short of breath but will become short of breath when taking out the trash. Associated symptoms include that of intermittent episodes of  Non radiating midsternal/left chest pain that is felt as a " twinge"  All immunizations and health maintenance protocols were  reviewed with the patient and needed orders were placed.  Appropriate screening laboratory values were ordered for the patient including screening of hyperlipidemia, renal function and hepatic function. If indicated by BPH, a PSA was ordered.  Medication reconciliation,  past medical history, social history, problem list and allergies were reviewed in detail with the patient  Goals were established with regard to weight loss, exercise, and  diet in compliance with medications. He does not follow a heart healthy diet and he does not exercise, he lives a pretty sedentary lifestyle  Wt Readings from Last 3 Encounters:  11/07/18 260 lb (117.9 kg)  04/27/18 261 lb (118.4 kg)  11/02/17 258 lb 4 oz (117.1 kg)   End of life planning was discussed.   Review of Systems  Constitutional: Negative for fatigue.  HENT: Positive for hearing loss.   Eyes: Negative.   Respiratory: Positive for cough and shortness of breath. Negative for chest tightness and wheezing.   Cardiovascular: Positive for chest pain.  Gastrointestinal: Negative.   Endocrine: Negative.   Genitourinary: Negative.   Musculoskeletal: Positive for arthralgias and back pain.  Skin: Negative.   Allergic/Immunologic: Negative.   Neurological: Negative.   Hematological: Negative.   Psychiatric/Behavioral: Negative.   All other systems reviewed and are negative.  Past Medical History:  Diagnosis Date  . Anxiety   . Arthritis    right wrist; pt. states "everywhere"  . Bipolar disorder ([place])   . Depression   . GERD (gastroesophageal reflux disease)   . History of kidney stones   . History of MRSA infection  left knee after arthroplasty  . Hyperlipidemia   . Hypertension    states under control with med., has been on med. x 1 yr.  . Impaired hearing   . Left bundle branch block (LBBB) on electrocardiogram 10/29/2015  . Macular degeneration (senile) of retina    left    Social History   Socioeconomic History  .  Marital status: Married    Spouse name: Not on file  . Number of children: Not on file  . Years of education: Not on file  . Highest education level: Not on file  Occupational History  . Not on file  Social Needs  . Financial resource strain: Not on file  . Food insecurity:    Worry: Not on file    Inability: Not on file  . Transportation needs:    Medical: Not on file    Non-medical: Not on file  Tobacco Use  . Smoking status: Never Smoker  . Smokeless tobacco: Never Used  Substance and Sexual Activity  . Alcohol use: Yes    Comment: occasionally  . Drug use: No  . Sexual activity: Not Currently    Birth control/protection: None  Lifestyle  . Physical activity:    Days per week: Not on file    Minutes per session: Not on file  . Stress: Not on file  Relationships  . Social connections:    Talks on phone: Not on file    Gets together: Not on file    Attends religious service: Not on file    Active member of club or organization: Not on file    Attends meetings of clubs or organizations: Not on file    Relationship status: Not on file  . Intimate partner violence:    Fear of current or ex partner: Not on file    Emotionally abused: Not on file    Physically abused: Not on file    Forced sexual activity: Not on file  Other Topics Concern  . Not on file  Social History Narrative   Married, lives in [place].  Retired Dance movement psychotherapist.   Two daughters, both married,3 grandchildren ( 34, 76, 1 year)    No regular exercise.  Never smoker.  No ETOH.  No drugs.    Past Surgical History:  Procedure Laterality Date  . ANKLE FUSION Bilateral   . ANKLE SURGERY Bilateral    ligament surgery  . CARDIAC CATHETERIZATION  x 2   1983; 11/14/2003  . CARPOMETACARPEL SUSPENSION PLASTY Right 09/22/2016   Procedure: right thumb carpometacarpal  ARTHROPLASTY;  Surgeon: Leanora Cover, MD;  Location: [facility];  Service: Orthopedics;  Laterality: Right;  right thumb  carpometacarpal  ARTHROPLASTY  . CARPOMETACARPEL SUSPENSION PLASTY Right 11/02/2017   Procedure: RIGHT THUMB SUSPENSIONPLASTY WITH TIGHTROPE, DISTAL POLE SCAPHOID  EXCISION;  Surgeon: Leanora Cover, MD;  Location: [facility];  Service: Orthopedics;  Laterality: Right;  . ELBOW SURGERY Left   . INGUINAL HERNIA REPAIR Right   . OPEN REDUCTION INTERNAL FIXATION (ORIF) DISTAL RADIAL FRACTURE Right 10/29/2015   Procedure: OPEN REDUCTION INTERNAL FIXATION (ORIF) DISTAL RADIAL FRACTURE;  Surgeon: Leanora Cover, MD;  Location: [facility];  Service: Orthopedics;  Laterality: Right;  . ORIF ELBOW FRACTURE Right   . TONSILLECTOMY    . TOTAL KNEE ARTHROPLASTY Bilateral   . ULNAR COLLATERAL LIGAMENT REPAIR Right 12/29/2015   Procedure: REPAIR  RIGHT LATERAL ULNAR COLLATERAL LIGAMENT TEAR  EXTENSOR ORIGIN ;  Surgeon: Leanora Cover, MD;  Location: [facility];  Service: Orthopedics;  Laterality: Right;  . WRIST ARTHROSCOPY WITH DEBRIDEMENT Right 07/14/2016   Procedure: RIGHT WRIST ARTHROSCOPY WITH DEBRIDEMENT  TRIANGULAR FIBROCARTILAGE COMPLEX;  Surgeon: Leanora Cover, MD;  Location: [facility];  Service: Orthopedics;  Laterality: Right;    Family History  Problem Relation Age of Onset  . Alcohol abuse Mother   . Ovarian cancer Mother   . Alcohol abuse Father   . Pancreatic cancer Father   . Depression Daughter   . Paranoid behavior Daughter   . Hyperlipidemia Brother     Allergies  Allergen Reactions  . Penicillins Hives    BLISTERS  . Codeine Rash    Current Outpatient Medications on File Prior to Visit  Medication Sig Dispense Refill  . ARIPiprazole ER (ABILIFY MAINTENA) 400 MG PRSY prefilled syringe Inject 400 mg into the muscle every 28 (twenty-eight) days. 1 each 2  . aspirin 81 MG tablet Take 81 mg by mouth daily.    Marland Kitchen atorvastatin (LIPITOR) 40 MG tablet TAKE 1 TABLET BY MOUTH EVERY DAY 90 tablet 3  . benztropine (COGENTIN) 1 MG tablet  Take 1 tablet (1 mg total) by mouth daily. 90 tablet 0  . citalopram (CELEXA) 20 MG tablet Take 1 tablet (20 mg total) by mouth daily. 90 tablet 0  . fenofibrate (TRICOR) 145 MG tablet Take 1 tablet (145 mg total) by mouth daily. 90 tablet 3  . lisinopril (PRINIVIL,ZESTRIL) 20 MG tablet Take 1 tablet (20 mg total) by mouth daily. 90 tablet 3  . Multiple Vitamin (MULTIVITAMIN WITH MINERALS) TABS tablet Take 1 tablet by mouth daily.    Marland Kitchen nystatin (NYSTATIN) powder Apply topically 4 (four) times daily. 15 g 0  . Omega-3 Fatty Acids (FISH OIL PO) Take by mouth.    Marland Kitchen omeprazole (PRILOSEC) 40 MG capsule TAKE 1 CAPSULE BY MOUTH TWICE A DAY 180 capsule 0  . temazepam (RESTORIL) 30 MG capsule Take 1 capsule (30 mg total) by mouth at bedtime as needed. for sleep 90 capsule 0  . traZODone (DESYREL) 100 MG tablet Take 1 tablet (100 mg total) by mouth at bedtime. 90 tablet 0  . Vitamin D, Cholecalciferol, 1000 UNITS TABS Take 1 tablet by mouth daily.     Current Facility-Administered Medications on File Prior to Visit  Medication Dose Route Frequency Provider Last Rate Last Dose  . ARIPiprazole ER (ABILIFY MAINTENA) injection 400 mg  400 mg Intramuscular Q28 days Arfeen, Arlyce Harman, MD   400 mg at 10/24/18 1443  . ipratropium-albuterol (DUONEB) 0.5-2.5 (3) MG/3ML nebulizer solution 3 mL  3 mL Nebulization Once Lexey Fletes, NP        There were no vitals taken for this visit.       Objective:   Physical Exam Vitals signs and nursing note reviewed.  Constitutional:      General: He is not in acute distress.    Appearance: Normal appearance. He is well-developed. He is obese. He is not ill-appearing, toxic-appearing or diaphoretic.  HENT:     Head: Normocephalic and atraumatic.     Right Ear: Tympanic membrane, ear canal and external ear normal. There is no impacted cerumen.     Left Ear: Tympanic membrane, ear canal and external ear normal. There is no impacted cerumen.     Nose: Nose normal. No  congestion or rhinorrhea.     Mouth/Throat:     Mouth: Mucous membranes are moist.     Dentition: Abnormal dentition.  Pharynx: Oropharynx is clear. No oropharyngeal exudate or posterior oropharyngeal erythema.  Eyes:     General:        Right eye: No discharge.        Left eye: No discharge.     Conjunctiva/sclera: Conjunctivae normal.     Pupils: Pupils are equal, round, and reactive to light.  Neck:     Musculoskeletal: Normal range of motion and neck supple.     Thyroid: No thyromegaly.     Trachea: No tracheal deviation.  Cardiovascular:     Rate and Rhythm: Normal rate and regular rhythm.     Pulses: Normal pulses.     Heart sounds: Normal heart sounds. No murmur. No friction rub. No gallop.   Pulmonary:     Effort: Pulmonary effort is normal. No respiratory distress.     Breath sounds: Normal breath sounds. No stridor. No wheezing, rhonchi or rales.  Chest:     Chest wall: No tenderness.  Abdominal:     General: Bowel sounds are normal. There is no distension.     Palpations: Abdomen is soft. There is no mass.     Tenderness: There is no abdominal tenderness. There is no right CVA tenderness, left CVA tenderness, guarding or rebound.     Hernia: No hernia is present.  Musculoskeletal: Normal range of motion.        General: No swelling, tenderness, deformity or signs of injury.     Right lower leg: No edema.     Left lower leg: No edema.  Lymphadenopathy:     Cervical: No cervical adenopathy.  Skin:    General: Skin is warm and dry.     Coloration: Skin is not jaundiced or pale.     Findings: No bruising, erythema, lesion or rash.  Neurological:     General: No focal deficit present.     Mental Status: He is alert. Mental status is at baseline. He is disoriented.     Cranial Nerves: No cranial nerve deficit.     Sensory: No sensory deficit.     Motor: No weakness.     Coordination: Coordination normal.     Gait: Gait normal.     Deep Tendon Reflexes: Reflexes  normal.  Psychiatric:        Mood and Affect: Mood normal.        Behavior: Behavior normal.        Thought Content: Thought content normal.        Judgment: Judgment normal.        Assessment & Plan:  1. Routine general medical examination at a health care facility - I really would like him to work on weight loss through diet and exercise. Start a walking regimen.  - Follow up in one year or sooner if needed - CBC with Differential/Platelet - Comprehensive metabolic panel - Hemoglobin A1c - Lipid panel - TSH  2. Bipolar affective disorder, currently depressed, mild ([place]) - Continue with plan of care by Psychiatry   3. Essential hypertension - Will switch lisinopril to Norvasc due to dry cough. Will have him follow up in 2-3 weeks for BP check  - CBC with Differential/Platelet - Comprehensive metabolic panel - Hemoglobin A1c - Lipid panel - TSH - amLODipine (NORVASC) 2.5 MG tablet; Take 1 tablet (2.5 mg total) by mouth daily.  Dispense: 30 tablet; Refill: 1  4. Gastroesophageal reflux disease without esophagitis - Controlled.  - CBC with Differential/Platelet - Comprehensive metabolic panel - Hemoglobin A1c -  Lipid panel - TSH  5. Other hyperlipidemia - Consider increase in statin  - CBC with Differential/Platelet - Comprehensive metabolic panel - Hemoglobin A1c - Lipid panel - TSH  6. Prostate cancer screening  - PSA  7. Chest pain, unspecified type  - EKG 12-Lead- NR, LBB, Rate 66. Comparable to previous EKGs.  - DG Chest 2 View; Future  8. Colon cancer screening  - Ambulatory referral to Gastroenterology  9. Shortness of breath - likely due to sedentary lifestyle  - CBC with Differential/Platelet - Comprehensive metabolic panel - TSH - EKG 12-Lead - DG Chest 2 View; Future   [name], NP

## 2018-11-07 NOTE — Patient Instructions (Signed)
Your EKG has not changed   We will get some blood work and a chest xray but I am thinking that the shortness of breath is from lack of exercise - please start walking   I have replaced Lisinopril with Norvasc. Please follow up in 2-3 weeks for blood pressure check

## 2018-11-14 DIAGNOSIS — R69 Illness, unspecified: Secondary | ICD-10-CM | POA: Diagnosis not present

## 2018-11-14 DIAGNOSIS — F411 Generalized anxiety disorder: Secondary | ICD-10-CM | POA: Diagnosis not present

## 2018-11-22 ENCOUNTER — Ambulatory Visit (INDEPENDENT_AMBULATORY_CARE_PROVIDER_SITE_OTHER): Payer: Medicare HMO

## 2018-11-22 DIAGNOSIS — F3112 Bipolar disorder, current episode manic without psychotic features, moderate: Secondary | ICD-10-CM

## 2018-11-22 DIAGNOSIS — R69 Illness, unspecified: Secondary | ICD-10-CM | POA: Diagnosis not present

## 2018-11-22 NOTE — Progress Notes (Signed)
Patient presents today with flat affect and good mood for his monthly injection.Patient's Abilify Maintena 400 mg IM injectionwasprepared as ordered and given to patient in hisleftupper outer gluteal area. Patient tolerated injection without any complaint of pain or discomfort and agreed to return in 4 weeks for next due injection. Patient denied any suicidal or homicidal ideations, no auditory or visual hallucinations and no plan or intent to harm self or others. Patient stated his mood has remained improved and mostly level since going on Abilify Maintena an no problems with the medications or side effects at this time. Patient scheduled next return injection visit for the coming month and will call if any problems prior till then 

## 2018-11-23 DIAGNOSIS — H26493 Other secondary cataract, bilateral: Secondary | ICD-10-CM | POA: Diagnosis not present

## 2018-11-23 DIAGNOSIS — H353211 Exudative age-related macular degeneration, right eye, with active choroidal neovascularization: Secondary | ICD-10-CM | POA: Diagnosis not present

## 2018-11-23 DIAGNOSIS — H353222 Exudative age-related macular degeneration, left eye, with inactive choroidal neovascularization: Secondary | ICD-10-CM | POA: Diagnosis not present

## 2018-11-29 ENCOUNTER — Other Ambulatory Visit: Payer: Self-pay | Admitting: Family Medicine

## 2018-11-29 DIAGNOSIS — E7849 Other hyperlipidemia: Secondary | ICD-10-CM

## 2018-11-29 MED ORDER — FENOFIBRATE 145 MG PO TABS: 145.0000 mg | ORAL_TABLET | Freq: Every day | ORAL | 3 refills | Status: DC

## 2018-11-29 NOTE — Telephone Encounter (Signed)
Sent to the pharmacy by e-scribe. 

## 2018-12-04 ENCOUNTER — Other Ambulatory Visit: Payer: Self-pay | Admitting: Adult Health

## 2018-12-04 DIAGNOSIS — I10 Essential (primary) hypertension: Secondary | ICD-10-CM

## 2018-12-05 NOTE — Telephone Encounter (Signed)
Denied.  Filled for 2 months on 11/07/2018.  Has upcoming follow up.

## 2018-12-11 ENCOUNTER — Ambulatory Visit: Payer: Self-pay | Admitting: Adult Health

## 2018-12-20 ENCOUNTER — Other Ambulatory Visit: Payer: Self-pay

## 2018-12-20 ENCOUNTER — Ambulatory Visit (INDEPENDENT_AMBULATORY_CARE_PROVIDER_SITE_OTHER): Payer: Medicare HMO | Admitting: Psychiatry

## 2018-12-20 DIAGNOSIS — R69 Illness, unspecified: Secondary | ICD-10-CM | POA: Diagnosis not present

## 2018-12-20 DIAGNOSIS — F411 Generalized anxiety disorder: Secondary | ICD-10-CM | POA: Diagnosis not present

## 2018-12-20 DIAGNOSIS — F319 Bipolar disorder, unspecified: Secondary | ICD-10-CM | POA: Diagnosis not present

## 2018-12-20 MED ORDER — CITALOPRAM HYDROBROMIDE 20 MG PO TABS: 20.0000 mg | ORAL_TABLET | Freq: Every day | ORAL | 0 refills | Status: DC

## 2018-12-20 MED ORDER — ARIPIPRAZOLE ER 400 MG IM PRSY: 400.0000 mg | PREFILLED_SYRINGE | INTRAMUSCULAR | 2 refills | Status: DC

## 2018-12-20 MED ORDER — TEMAZEPAM 30 MG PO CAPS: 30.0000 mg | ORAL_CAPSULE | Freq: Every evening | ORAL | 0 refills | Status: DC | PRN

## 2018-12-20 MED ORDER — TRAZODONE HCL 100 MG PO TABS: 100.0000 mg | ORAL_TABLET | Freq: Every day | ORAL | 0 refills | Status: DC

## 2018-12-20 MED ORDER — BENZTROPINE MESYLATE 1 MG PO TABS: 1.0000 mg | ORAL_TABLET | Freq: Every day | ORAL | 0 refills | Status: DC

## 2018-12-20 NOTE — Progress Notes (Signed)
Virtual Visit via Telephone Note  I connected with Sean Hill on 12/20/18 at  3:20 PM EDT by telephone and verified that I am speaking with the correct person using two identifiers.   I discussed the limitations, risks, security and privacy concerns of performing an evaluation and management service by telephone and the availability of in person appointments. I also discussed with the patient that there may be a patient responsible charge related to this service. The patient expressed understanding and agreed to proceed.   History of Present Illness: Patient was evaluated through phone session.  He is sleeping better since we added temazepam.  He is a still taking trazodone but only 100 mg.  He is happy that he is sleeping at least 5 to 6 hours.  Denies any recent major manic episode but admitted some time racing thoughts.  Overall he describes the medicine is working.  He is not happy because he lockdown due to shelter in place and scared to go outside due to pandemic coronavirus.  He is getting therapy through phone by [name] every month.  He has chronic dry mouth but he is happy that medicine working.  He denies any irritability, anger, suicidal thoughts, or any paranoia.  He is getting Abilify injection once a month.  He is compliant with Celexa, trazodone, Cogentin, temazepam and Abilify injection.  His appetite is okay.  His energy level is fair.  Recently had blood work done at his primary care physician.  His labs are normal.  His hemoglobin A1c is a stable. Past Psychiatric History: Reviewed. H/O multiple hospitalization.  Last hospitalization in July 2014.  H/O overdose on Latuda with alcohol. H/O cutting wrist.  Took Tegretol, Depakote, Ambien, Remeron, Vistaril, Geodon, Abilify, lithium, Provigil, Zoloft, Neurontin, Lamictal, Wellbutrin, Risperdal and Pristiq. H/O mania, aggression, getting speeding tickets, excessive buying and impulsive behavior.  Recent Results (from the past 2160  hour(s))  CBC with Differential/Platelet     Status: None   Collection Time: 11/07/18  7:41 AM  Result Value Ref Range   WBC 5.6 4.0 - 10.5 K/uL   RBC 5.10 4.22 - 5.81 Mil/uL   Hemoglobin 14.6 13.0 - 17.0 g/dL   HCT 43.2 39.0 - 52.0 %   MCV 84.7 78.0 - 100.0 fl   MCHC 33.7 30.0 - 36.0 g/dL   RDW 14.8 11.5 - 15.5 %   Platelets 240.0 150.0 - 400.0 K/uL   Neutrophils Relative % 54.6 43.0 - 77.0 %   Lymphocytes Relative 34.7 12.0 - 46.0 %   Monocytes Relative 7.1 3.0 - 12.0 %   Eosinophils Relative 2.7 0.0 - 5.0 %   Basophils Relative 0.9 0.0 - 3.0 %   Neutro Abs 3.1 1.4 - 7.7 K/uL   Lymphs Abs 2.0 0.7 - 4.0 K/uL   Monocytes Absolute 0.4 0.1 - 1.0 K/uL   Eosinophils Absolute 0.2 0.0 - 0.7 K/uL   Basophils Absolute 0.1 0.0 - 0.1 K/uL  Comprehensive metabolic panel     Status: None   Collection Time: 11/07/18  7:41 AM  Result Value Ref Range   Sodium 140 135 - 145 mEq/L   Potassium 4.4 3.5 - 5.1 mEq/L   Chloride 105 96 - 112 mEq/L   CO2 26 19 - 32 mEq/L   Glucose, Bld 95 70 - 99 mg/dL   BUN 15 6 - 23 mg/dL   Creatinine, Ser 1.16 0.40 - 1.50 mg/dL   Total Bilirubin 0.6 0.2 - 1.2 mg/dL   Alkaline Phosphatase 64  39 - 117 U/L   AST 28 0 - 37 U/L   ALT 20 0 - 53 U/L   Total Protein 6.3 6.0 - 8.3 g/dL   Albumin 4.1 3.5 - 5.2 g/dL   Calcium 9.1 8.4 - 10.5 mg/dL   GFR 61.23 >60.00 mL/min  Hemoglobin A1c     Status: None   Collection Time: 11/07/18  7:41 AM  Result Value Ref Range   Hgb A1c MFr Bld 6.1 4.6 - 6.5 %    Comment: Glycemic Control Guidelines for People with Diabetes:Non Diabetic:  <6%Goal of Therapy: <7%Additional Action Suggested:  >8%   Lipid panel     Status: Abnormal   Collection Time: 11/07/18  7:41 AM  Result Value Ref Range   Cholesterol 116 0 - 200 mg/dL    Comment: ATP III Classification       Desirable:  < 200 mg/dL               Borderline High:  200 - 239 mg/dL          High:  > = 240 mg/dL   Triglycerides 252.0 (H) 0.0 - 149.0 mg/dL    Comment: Normal:   <150 mg/dLBorderline High:  150 - 199 mg/dL   HDL 29.20 (L) >39.00 mg/dL   VLDL 50.4 (H) 0.0 - 40.0 mg/dL   Total CHOL/HDL Ratio 4     Comment:                Men          Women1/2 Average Risk     3.4          3.3Average Risk          5.0          4.42X Average Risk          9.6          7.13X Average Risk          15.0          11.0                       NonHDL 87.06     Comment: NOTE:  Non-HDL goal should be 30 mg/dL higher than patient's LDL goal (i.e. LDL goal of < 70 mg/dL, would have non-HDL goal of < 100 mg/dL)  TSH     Status: None   Collection Time: 11/07/18  7:41 AM  Result Value Ref Range   TSH 1.69 0.35 - 4.50 uIU/mL  PSA     Status: None   Collection Time: 11/07/18  7:41 AM  Result Value Ref Range   PSA 0.21 0.10 - 4.00 ng/mL    Comment: Test performed using Access [employer]br[employer]ch PSA Assay, a parmagnetic partical, chemiluminecent immunoassay.  LDL cholesterol, direct     Status: None   Collection Time: 11/07/18  7:41 AM  Result Value Ref Range   Direct LDL 65.0 mg/dL    Comment: Optimal:  <100 mg/dLNear or Above Optimal:  100-129 mg/dLBorderline High:  130-159 mg/dLHigh:  160-189 mg/dLVery High:  >190 mg/dL   Observations/Objective: Limited mental status examination done on the phone.  Patient is alert and oriented x3.  He describes his mood euthymic.  His speech is slow but clear and coherent.  There were no flight of ideas or loose association.  He denies any auditory or visual hallucination.  He denies any active or passive suicidal thoughts or homicidal thought.  There were  no delusions or any grandiosity.  His attention and concentration appears to be normal.  He was not distracted on the phone.  His cognition is intact.  He reported no major side effects other than dry mouth.  His fund of knowledge is adequate.  His insight judgment is good.  Assessment and Plan: Bipolar disorder type I.  Generalized anxiety disorder.  Patient is a stable on his current medication.  He like  to continue temazepam as he getting good sleep.  I reviewed his blood work results and his hemoglobin A1c is normal.  He is getting therapy session with [name] on the phone.  I will continue Cogentin 1 mg at bedtime, Abilify injection 400 mg intramuscular every 4 weeks, temazepam 30 mg at bedtime, trazodone 100 mg at bedtime and Celexa 20 mg daily.  Discussed medication side effects and benefits.  Recommended to call us back if is any question or any concern.  Follow-up in 3 months.  Follow Up Instructions:    I discussed the assessment and treatment plan with the patient. The patient was provided an opportunity to ask questions and all were answered. The patient agreed with the plan and demonstrated an understanding of the instructions.   The patient was advised to call back or seek an in-person evaluation if the symptoms worsen or if the condition fails to improve as anticipated.  I provided 15 minutes of non-face-to-face time during this encounter.   [name], MD

## 2018-12-21 ENCOUNTER — Other Ambulatory Visit: Payer: Self-pay

## 2018-12-21 ENCOUNTER — Ambulatory Visit (INDEPENDENT_AMBULATORY_CARE_PROVIDER_SITE_OTHER): Payer: Medicare HMO

## 2018-12-21 DIAGNOSIS — R69 Illness, unspecified: Secondary | ICD-10-CM | POA: Diagnosis not present

## 2018-12-21 DIAGNOSIS — F3170 Bipolar disorder, currently in remission, most recent episode unspecified: Secondary | ICD-10-CM | POA: Diagnosis not present

## 2018-12-21 NOTE — Progress Notes (Signed)
Patient presents today with flat affect and good mood for his monthly injection.Patient's Abilify Maintena 400 mg IM injectionwasprepared as ordered and given to patient in hisright upper outer gluteal area. Patient tolerated injection without any complaint of pain or discomfort and agreed to return in 4 weeks for next due injection. Patient denied any suicidal or homicidal ideations, no auditory or visual hallucinations and no plan or intent to harm self or others. Patient stated his mood has remained improved and mostly level since going on Abilify Maintena an no problems with the medications or side effects at this time. Patient scheduled next return injection visit for the coming month and will call if any problems prior till then.

## 2018-12-31 ENCOUNTER — Other Ambulatory Visit: Payer: Self-pay | Admitting: Family Medicine

## 2018-12-31 DIAGNOSIS — I10 Essential (primary) hypertension: Secondary | ICD-10-CM

## 2018-12-31 NOTE — Telephone Encounter (Signed)
Pt has appt on 01/01/2019.  Please send for 90 days if appropriate.

## 2019-01-01 ENCOUNTER — Encounter: Payer: Self-pay | Admitting: Adult Health

## 2019-01-01 ENCOUNTER — Other Ambulatory Visit: Payer: Self-pay

## 2019-01-01 ENCOUNTER — Ambulatory Visit (INDEPENDENT_AMBULATORY_CARE_PROVIDER_SITE_OTHER): Payer: Medicare HMO | Admitting: Adult Health

## 2019-01-01 DIAGNOSIS — I10 Essential (primary) hypertension: Secondary | ICD-10-CM

## 2019-01-01 DIAGNOSIS — R05 Cough: Secondary | ICD-10-CM | POA: Diagnosis not present

## 2019-01-01 DIAGNOSIS — R059 Cough, unspecified: Secondary | ICD-10-CM

## 2019-01-01 MED ORDER — ALBUTEROL SULFATE HFA 108 (90 BASE) MCG/ACT IN AERS: 2.0000 | INHALATION_SPRAY | Freq: Four times a day (QID) | RESPIRATORY_TRACT | 2 refills | Status: DC | PRN

## 2019-01-01 NOTE — Progress Notes (Signed)
Virtual Visit via Video Note  I connected with Sean Hill on 01/01/19 at  1:00 PM EDT by a video enabled telemedicine application and verified that I am speaking with the correct person using two identifiers.  Location patient: home Location provider:work or home office Persons participating in the virtual visit: patient, provider  I discussed the limitations of evaluation and management by telemedicine and the availability of in person appointments. The patient expressed understanding and agreed to proceed.   HPI: 78 year old male who is being evaluated today for follow-up regarding essential hypertension.  Back in February was complaining of a dry cough.  He was switched from lisinopril to Norvasc.  Today on follow-up.  He reports that since the switch when he was taking Norvasc he was getting very lightheaded when he would stand.  He continued to have a dry cough that not did not improve.  Due to the lightheadedness he switch back to lisinopril.  He has been monitoring his blood pressures at home and reports readings in the 130s over 70s to 80s consistently.  He is no longer feeling dizzy or lightheaded when he changes positions.  Unfortunately he continues to have a dry cough that is intermittent throughout the day.  He is feeling short of breath, which is not a new finding for him.  Shortness of breath is with exertion and at rest but not as bad as when resting.  Had a chest x-ray done in February 2020 did not show any acute abnormalities.  His last Echo In 2017 was essentially normal.   No CP   ROS: See pertinent positives and negatives per HPI.  Past Medical History:  Diagnosis Date  . Anxiety   . Arthritis    right wrist; pt. states "everywhere"  . Bipolar disorder ([place])   . Depression   . GERD (gastroesophageal reflux disease)   . History of kidney stones   . 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 Hyperlipidemia   . Hypertension    states  under control with med., has been on med. x 1 yr.  . Impaired hearing   . Left bundle branch block (LBBB) on electrocardiogram 10/29/2015  . Macular degeneration (senile) of retina    left    Past Surgical History:  Procedure Laterality Date  . ANKLE FUSION Bilateral   . ANKLE SURGERY Bilateral    ligament surgery  . CARDIAC CATHETERIZATION  x 2   1983; 11/14/2003  . CARPOMETACARPEL SUSPENSION PLASTY Right 09/22/2016   Procedure: right thumb carpometacarpal  ARTHROPLASTY;  Surgeon: Leanora Cover, MD;  Location: [place];  Service: Orthopedics;  Laterality: Right;  right thumb carpometacarpal  ARTHROPLASTY  . CARPOMETACARPEL SUSPENSION PLASTY Right 11/02/2017   Procedure: RIGHT THUMB SUSPENSIONPLASTY WITH TIGHTROPE, DISTAL POLE SCAPHOID  EXCISION;  Surgeon: Leanora Cover, MD;  Location: [place];  Service: Orthopedics;  Laterality: Right;  . ELBOW SURGERY Left   . INGUINAL HERNIA REPAIR Right   . OPEN REDUCTION INTERNAL FIXATION (ORIF) DISTAL RADIAL FRACTURE Right 10/29/2015   Procedure: OPEN REDUCTION INTERNAL FIXATION (ORIF) DISTAL RADIAL FRACTURE;  Surgeon: Leanora Cover, MD;  Location: [place];  Service: Orthopedics;  Laterality: Right;  . ORIF ELBOW FRACTURE Right   . TONSILLECTOMY    . TOTAL KNEE ARTHROPLASTY Bilateral   . ULNAR COLLATERAL LIGAMENT REPAIR Right 12/29/2015   Procedure: REPAIR  RIGHT LATERAL ULNAR COLLATERAL LIGAMENT TEAR  EXTENSOR ORIGIN ;  Surgeon: Leanora Cover, MD;  Location: [place];  Service: Orthopedics;  Laterality: Right;  . WRIST ARTHROSCOPY WITH DEBRIDEMENT Right 07/14/2016   Procedure: RIGHT WRIST ARTHROSCOPY WITH DEBRIDEMENT  TRIANGULAR FIBROCARTILAGE COMPLEX;  Surgeon: Leanora Cover, MD;  Location: [place];  Service: Orthopedics;  Laterality: Right;    Family History  Problem Relation Age of Onset  . Alcohol abuse Mother   . Ovarian cancer Mother   . Alcohol abuse Father   .  Pancreatic cancer Father   . Depression Daughter   . Paranoid behavior Daughter   . Hyperlipidemia Brother      Current Outpatient Medications:  .  lisinopril (PRINIVIL,ZESTRIL) 20 MG tablet, Take 20 mg by mouth daily., Disp: , Rfl:  .  albuterol (PROVENTIL HFA;VENTOLIN HFA) 108 (90 Base) MCG/ACT inhaler, Inhale 2 puffs into the lungs every 6 (six) hours as needed for wheezing or shortness of breath., Disp: 1 Inhaler, Rfl: 2 .  ARIPiprazole ER (ABILIFY MAINTENA) 400 MG PRSY prefilled syringe, Inject 400 mg into the muscle every 28 (twenty-eight) days., Disp: 1 each, Rfl: 2 .  aspirin 81 MG tablet, Take 81 mg by mouth daily., Disp: , Rfl:  .  atorvastatin (LIPITOR) 40 MG tablet, TAKE 1 TABLET BY MOUTH EVERY DAY, Disp: 90 tablet, Rfl: 3 .  benztropine (COGENTIN) 1 MG tablet, Take 1 tablet (1 mg total) by mouth daily., Disp: 90 tablet, Rfl: 0 .  citalopram (CELEXA) 20 MG tablet, Take 1 tablet (20 mg total) by mouth daily., Disp: 90 tablet, Rfl: 0 .  fenofibrate (TRICOR) 145 MG tablet, Take 1 tablet (145 mg total) by mouth daily., Disp: 90 tablet, Rfl: 3 .  Multiple Vitamin (MULTIVITAMIN WITH MINERALS) TABS tablet, Take 1 tablet by mouth daily., Disp: , Rfl:  .  Omega-3 Fatty Acids (FISH OIL PO), Take by mouth., Disp: , Rfl:  .  omeprazole (PRILOSEC) 40 MG capsule, TAKE 1 CAPSULE BY MOUTH TWICE A DAY, Disp: 180 capsule, Rfl: 0 .  temazepam (RESTORIL) 30 MG capsule, Take 1 capsule (30 mg total) by mouth at bedtime as needed. for sleep, Disp: 90 capsule, Rfl: 0 .  traZODone (DESYREL) 100 MG tablet, Take 1 tablet (100 mg total) by mouth at bedtime., Disp: 90 tablet, Rfl: 0 .  Vitamin D, Cholecalciferol, 1000 UNITS TABS, Take 1 tablet by mouth daily., Disp: , Rfl:   Current Facility-Administered Medications:  .  ARIPiprazole ER (ABILIFY MAINTENA) injection 400 mg, 400 mg, Intramuscular, Q28 days, Arfeen, Arlyce Harman, MD, 400 mg at 12/21/18 0917 .  ipratropium-albuterol (DUONEB) 0.5-2.5 (3) MG/3ML  nebulizer solution 3 mL, 3 mL, Nebulization, Once, Denim Start, Tommi Rumps, NP  EXAM:  VITALS per patient if applicable:  GENERAL: alert, oriented, appears well and in no acute distress  HEENT: atraumatic, conjunttiva clear, no obvious abnormalities on inspection of external nose and ears  NECK: normal movements of the head and neck  LUNGS: on inspection no signs of respiratory distress, breathing rate appears normal, no obvious gross SOB, gasping or wheezing  CV: no obvious cyanosis  MS: moves all visible extremities without noticeable abnormality  PSYCH/NEURO: pleasant and cooperative, no obvious depression or anxiety, speech and thought processing grossly intact  ASSESSMENT AND PLAN:  Discussed the following assessment and plan:  Essential hypertension  Cough  -DC Norvasc and restart lisinopril.  He is taking Prilosec so I doubt the cough is coming from acid reflux.  He was advised to make lifestyle modifications start exercising more.  He does not want to see cardiology at  this time.  We will trial him on albuterol inhaler to use as needed for possible bronchospasm.  He will follow-up with me if his symptoms continue   I discussed the assessment and treatment plan with the patient. The patient was provided an opportunity to ask questions and all were answered. The patient agreed with the plan and demonstrated an understanding of the instructions.   The patient was advised to call back or seek an in-person evaluation if the symptoms worsen or if the condition fails to improve as anticipated.   [name], NP

## 2019-01-06 DIAGNOSIS — S91102A Unspecified open wound of left great toe without damage to nail, initial encounter: Secondary | ICD-10-CM | POA: Diagnosis not present

## 2019-01-08 DIAGNOSIS — F411 Generalized anxiety disorder: Secondary | ICD-10-CM | POA: Diagnosis not present

## 2019-01-08 DIAGNOSIS — R69 Illness, unspecified: Secondary | ICD-10-CM | POA: Diagnosis not present

## 2019-01-14 DIAGNOSIS — S91102D Unspecified open wound of left great toe without damage to nail, subsequent encounter: Secondary | ICD-10-CM | POA: Diagnosis not present

## 2019-01-18 ENCOUNTER — Other Ambulatory Visit: Payer: Self-pay

## 2019-01-18 ENCOUNTER — Ambulatory Visit (INDEPENDENT_AMBULATORY_CARE_PROVIDER_SITE_OTHER): Payer: Medicare HMO

## 2019-01-18 DIAGNOSIS — R69 Illness, unspecified: Secondary | ICD-10-CM | POA: Diagnosis not present

## 2019-01-18 DIAGNOSIS — Z4802 Encounter for removal of sutures: Secondary | ICD-10-CM | POA: Diagnosis not present

## 2019-01-18 DIAGNOSIS — F3170 Bipolar disorder, currently in remission, most recent episode unspecified: Secondary | ICD-10-CM | POA: Diagnosis not present

## 2019-01-18 NOTE — Progress Notes (Signed)
Patient presents today with flat affect and good mood for his monthly injection.Patient's Abilify Maintena 400 mg IM injectionwasprepared as ordered and given to patient in hisleftupper outer gluteal area. Patient tolerated injection without any complaint of pain or discomfort and agreed to return in 4 weeks for next due injection. Patient denied any suicidal or homicidal ideations, no auditory or visual hallucinations and no plan or intent to harm self or others. Patient stated his mood has remained improved and mostly level since going on Abilify Maintena an no problems with the medications or side effects at this time. Patient scheduled next return injection visit for the coming month and will call if any problems prior till then

## 2019-01-25 ENCOUNTER — Other Ambulatory Visit: Payer: Self-pay | Admitting: Adult Health

## 2019-01-25 DIAGNOSIS — K219 Gastro-esophageal reflux disease without esophagitis: Secondary | ICD-10-CM

## 2019-01-25 NOTE — Telephone Encounter (Signed)
Sent to the pharmacy by e-scribe. 

## 2019-01-26 ENCOUNTER — Other Ambulatory Visit: Payer: Self-pay | Admitting: Adult Health

## 2019-01-26 DIAGNOSIS — I10 Essential (primary) hypertension: Secondary | ICD-10-CM

## 2019-01-28 NOTE — Telephone Encounter (Signed)
Denied.  Pt is no longer taking.  Message sent to the pharmacy to d/c.

## 2019-02-05 DIAGNOSIS — F411 Generalized anxiety disorder: Secondary | ICD-10-CM | POA: Diagnosis not present

## 2019-02-05 DIAGNOSIS — R69 Illness, unspecified: Secondary | ICD-10-CM | POA: Diagnosis not present

## 2019-02-06 DIAGNOSIS — R69 Illness, unspecified: Secondary | ICD-10-CM | POA: Diagnosis not present

## 2019-02-08 DIAGNOSIS — H353211 Exudative age-related macular degeneration, right eye, with active choroidal neovascularization: Secondary | ICD-10-CM | POA: Diagnosis not present

## 2019-02-14 ENCOUNTER — Encounter: Payer: Self-pay | Admitting: Adult Health

## 2019-02-20 ENCOUNTER — Ambulatory Visit (INDEPENDENT_AMBULATORY_CARE_PROVIDER_SITE_OTHER): Payer: Medicare HMO

## 2019-02-20 ENCOUNTER — Other Ambulatory Visit: Payer: Self-pay

## 2019-02-20 DIAGNOSIS — R69 Illness, unspecified: Secondary | ICD-10-CM | POA: Diagnosis not present

## 2019-02-20 DIAGNOSIS — F3170 Bipolar disorder, currently in remission, most recent episode unspecified: Secondary | ICD-10-CM | POA: Diagnosis not present

## 2019-02-20 NOTE — Progress Notes (Signed)
Patient presents today with flat affect and good mood for his monthly injection.Patient's Abilify Maintena 400 mg IM injectionwasprepared as ordered and given to patient in hisleftupper outer gluteal area. Patient tolerated injection without any complaint of pain or discomfort and agreed to return in 4 weeks for next due injection. Patient denied any suicidal or homicidal ideations, no auditory or visual hallucinations and no plan or intent to harm self or others. Patient stated his mood has remained improved and mostly level since going on Abilify Maintena an no problems with the medications or side effects at this time. Patient scheduled next return injection visit for the coming month and will call if any problems prior till then

## 2019-02-27 DIAGNOSIS — R69 Illness, unspecified: Secondary | ICD-10-CM | POA: Diagnosis not present

## 2019-02-27 DIAGNOSIS — F411 Generalized anxiety disorder: Secondary | ICD-10-CM | POA: Diagnosis not present

## 2019-03-11 ENCOUNTER — Other Ambulatory Visit (HOSPITAL_COMMUNITY): Payer: Self-pay | Admitting: Psychiatry

## 2019-03-11 DIAGNOSIS — F319 Bipolar disorder, unspecified: Secondary | ICD-10-CM

## 2019-03-12 DIAGNOSIS — R69 Illness, unspecified: Secondary | ICD-10-CM | POA: Diagnosis not present

## 2019-03-18 ENCOUNTER — Other Ambulatory Visit (HOSPITAL_COMMUNITY): Payer: Self-pay | Admitting: Psychiatry

## 2019-03-18 ENCOUNTER — Other Ambulatory Visit (HOSPITAL_COMMUNITY): Payer: Self-pay

## 2019-03-18 DIAGNOSIS — F319 Bipolar disorder, unspecified: Secondary | ICD-10-CM

## 2019-03-18 MED ORDER — BENZTROPINE MESYLATE 1 MG PO TABS: 1.0000 mg | ORAL_TABLET | Freq: Every day | ORAL | 0 refills | Status: DC

## 2019-03-20 ENCOUNTER — Other Ambulatory Visit: Payer: Self-pay

## 2019-03-20 ENCOUNTER — Ambulatory Visit (INDEPENDENT_AMBULATORY_CARE_PROVIDER_SITE_OTHER): Payer: Medicare HMO

## 2019-03-20 DIAGNOSIS — R69 Illness, unspecified: Secondary | ICD-10-CM | POA: Diagnosis not present

## 2019-03-20 DIAGNOSIS — F3170 Bipolar disorder, currently in remission, most recent episode unspecified: Secondary | ICD-10-CM

## 2019-03-20 NOTE — Progress Notes (Signed)
Patient presents today with flat affect and good mood for his monthly injection.Patient's Abilify Maintena 400 mg IM injectionwasprepared as ordered and given to patient in hisright upper outer gluteal area. Patient tolerated injection without any complaint of pain or discomfort and agreed to return in 4 weeks for next due injection. Patient denied any suicidal or homicidal ideations, no auditory or visual hallucinations and no plan or intent to harm self or others. Patient stated his mood has remained improved and mostly level since going on Abilify Maintena an no problems with the medications or side effects at this time. Patient scheduled next return injection visit for the coming month and will call if any problems prior till then

## 2019-03-25 ENCOUNTER — Ambulatory Visit (INDEPENDENT_AMBULATORY_CARE_PROVIDER_SITE_OTHER): Payer: Medicare HMO | Admitting: Psychiatry

## 2019-03-25 ENCOUNTER — Encounter (HOSPITAL_COMMUNITY): Payer: Self-pay | Admitting: Psychiatry

## 2019-03-25 ENCOUNTER — Other Ambulatory Visit: Payer: Self-pay

## 2019-03-25 DIAGNOSIS — F411 Generalized anxiety disorder: Secondary | ICD-10-CM | POA: Diagnosis not present

## 2019-03-25 DIAGNOSIS — F319 Bipolar disorder, unspecified: Secondary | ICD-10-CM

## 2019-03-25 DIAGNOSIS — R69 Illness, unspecified: Secondary | ICD-10-CM | POA: Diagnosis not present

## 2019-03-25 MED ORDER — BENZTROPINE MESYLATE 1 MG PO TABS: 1.0000 mg | ORAL_TABLET | Freq: Every day | ORAL | 0 refills | Status: DC

## 2019-03-25 MED ORDER — ARIPIPRAZOLE ER 400 MG IM PRSY: 400.0000 mg | PREFILLED_SYRINGE | INTRAMUSCULAR | 2 refills | Status: DC

## 2019-03-25 MED ORDER — CITALOPRAM HYDROBROMIDE 20 MG PO TABS: 20.0000 mg | ORAL_TABLET | Freq: Every day | ORAL | 0 refills | Status: DC

## 2019-03-25 MED ORDER — TEMAZEPAM 30 MG PO CAPS: 30.0000 mg | ORAL_CAPSULE | Freq: Every evening | ORAL | 0 refills | Status: DC | PRN

## 2019-03-25 MED ORDER — TRAZODONE HCL 100 MG PO TABS: ORAL_TABLET | ORAL | 0 refills | Status: DC

## 2019-03-25 NOTE — Progress Notes (Signed)
Virtual Visit via Telephone Note  I connected with Sean Hill on 03/25/19 at  2:20 PM EDT by telephone and verified that I am speaking with the correct person using two identifiers.   I discussed the limitations, risks, security and privacy concerns of performing an evaluation and management service by telephone and the availability of in person appointments. I also discussed with the patient that there may be a patient responsible charge related to this service. The patient expressed understanding and agreed to proceed.   History of Present Illness: Patient was evaluated by phone session.  He is doing well on his current medication.  Sometimes he feel little but dizzy in the morning and having dry mouth.  In the beginning he was not agreed to try any other medication or cut down the dose but now I suggested that he should try cutting down his trazodone that helped his dry mouth and dizziness as he is sleeping much better with temazepam.  He agreed with that.  He is doing therapy session with [name] on a regular basis.  He denies any paranoia, hallucination, mood swing or any irritability.  He denies any anger or any anxiety.  He feels the current medicine is working.  His energy level is good.  He admitted not happy due to COVID-19 because he cannot leave the house unless at his doctor's appointment or going to the grocery.  However he is managing COVID-19 better than he anticipated.  He is compliant with Celexa, trazodone, Cogentin, temazepam and Abilify injection.   Past Psychiatric History:Reviewed. H/O multiple hospitalization. Last hospitalization in July 2014. H/O overdose on Latuda with alcohol. H/O cutting wrist. Took Tegretol, Depakote, Ambien, Remeron, Vistaril, Geodon, Abilify, lithium, Provigil, Zoloft, Neurontin, Lamictal, Wellbutrin, Risperdal and Pristiq. H/O mania,aggression, getting speeding tickets, excessive buying and impulsive behavior. Psychiatric Specialty  Exam: Physical Exam  ROS  There were no vitals taken for this visit.There is no height or weight on file to calculate BMI.  General Appearance: NA  Eye Contact:  NA  Speech:  Clear and Coherent and Normal Rate  Volume:  Normal  Mood:  Anxious  Affect:  NA  Thought Process:  Goal Directed  Orientation:  Full (Time, Place, and Person)  Thought Content:  Logical  Suicidal Thoughts:  No  Homicidal Thoughts:  No  Memory:  Immediate;   Good Recent;   Good Remote;   Good  Judgement:  Good  Insight:  Good  Psychomotor Activity:  NA  Concentration:  Concentration: Fair and Attention Span: Fair  Recall:  Good  Fund of Knowledge:  Good  Language:  Good  Akathisia:  No  Handed:  Right  AIMS (if indicated):     Assets:  Communication Skills Desire for Improvement Housing Resilience Social Support Transportation  ADL's:  Intact  Cognition:  WNL  Sleep:   good      Assessment and Plan: Bipolar disorder type I.  Generalized anxiety disorder.  Recommend to try trazodone to take half tablet to reduce his dizziness in the morning and dry mouth.  However if he noticed that he is not sleeping good then he can go back to take full dose of trazodone 100 mg.  Patient does not want to change any of other medication since he is doing very well and his mood is stable.  Encouraged to continue therapy with Santa Lighter.  Continue Abilify injection 400 mg intramuscular every 4 weeks, Cogentin 1 mg at bedtime, temazepam 30 mg at bedtime, Celexa  20 mg daily and new instruction to take trazodone 100 mg half to 1 tablet as needed.  Recommended to call us back if is any question or any concern.  Follow-up in 3 months.  Follow Up Instructions:    I discussed the assessment and treatment plan with the patient. The patient was provided an opportunity to ask questions and all were answered. The patient agreed with the plan and demonstrated an understanding of the instructions.   The patient was advised  to call back or seek an in-person evaluation if the symptoms worsen or if the condition fails to improve as anticipated.  I provided 20 minutes of non-face-to-face time during this encounter.   [name], MD

## 2019-03-28 DIAGNOSIS — R69 Illness, unspecified: Secondary | ICD-10-CM | POA: Diagnosis not present

## 2019-04-02 DIAGNOSIS — F411 Generalized anxiety disorder: Secondary | ICD-10-CM | POA: Diagnosis not present

## 2019-04-02 DIAGNOSIS — R69 Illness, unspecified: Secondary | ICD-10-CM | POA: Diagnosis not present

## 2019-04-22 ENCOUNTER — Other Ambulatory Visit: Payer: Self-pay

## 2019-04-22 ENCOUNTER — Ambulatory Visit (INDEPENDENT_AMBULATORY_CARE_PROVIDER_SITE_OTHER): Payer: Medicare HMO

## 2019-04-22 DIAGNOSIS — R69 Illness, unspecified: Secondary | ICD-10-CM | POA: Diagnosis not present

## 2019-04-22 DIAGNOSIS — F3161 Bipolar disorder, current episode mixed, mild: Secondary | ICD-10-CM

## 2019-04-22 NOTE — Progress Notes (Signed)
Patient presents today with flat affect and good mood for his monthly injection.Patient's Abilify Maintena 400 mg IM injectionwasprepared as ordered and given to patient in his left upper outer gluteal area. Patient tolerated injection without any complaint of pain or discomfort and agreed to return in 4 weeks for next due injection. Patient denied any suicidal or homicidal ideations, no auditory or visual hallucinations and no plan or intent to harm self or others. Patient stated his mood has remained improved and mostly level since going on Abilify Maintena an no problems with the medications or side effects at this time. Patient scheduled next return injection visit for the coming month and will call if any problems prior till then

## 2019-04-23 ENCOUNTER — Ambulatory Visit: Payer: Medicare HMO | Admitting: Adult Health

## 2019-04-26 DIAGNOSIS — H353222 Exudative age-related macular degeneration, left eye, with inactive choroidal neovascularization: Secondary | ICD-10-CM | POA: Diagnosis not present

## 2019-04-26 DIAGNOSIS — H353211 Exudative age-related macular degeneration, right eye, with active choroidal neovascularization: Secondary | ICD-10-CM | POA: Diagnosis not present

## 2019-04-26 DIAGNOSIS — H26493 Other secondary cataract, bilateral: Secondary | ICD-10-CM | POA: Diagnosis not present

## 2019-05-01 ENCOUNTER — Other Ambulatory Visit: Payer: Self-pay | Admitting: Adult Health

## 2019-05-01 DIAGNOSIS — E785 Hyperlipidemia, unspecified: Secondary | ICD-10-CM

## 2019-05-07 NOTE — Telephone Encounter (Signed)
Sent to the pharmacy by e-scribe for 90 days. 

## 2019-05-14 DIAGNOSIS — R69 Illness, unspecified: Secondary | ICD-10-CM | POA: Diagnosis not present

## 2019-05-14 DIAGNOSIS — F411 Generalized anxiety disorder: Secondary | ICD-10-CM | POA: Diagnosis not present

## 2019-05-21 ENCOUNTER — Ambulatory Visit (INDEPENDENT_AMBULATORY_CARE_PROVIDER_SITE_OTHER): Payer: Medicare HMO

## 2019-05-21 ENCOUNTER — Other Ambulatory Visit: Payer: Self-pay

## 2019-05-21 DIAGNOSIS — R69 Illness, unspecified: Secondary | ICD-10-CM | POA: Diagnosis not present

## 2019-05-21 DIAGNOSIS — F3132 Bipolar disorder, current episode depressed, moderate: Secondary | ICD-10-CM | POA: Diagnosis not present

## 2019-05-22 NOTE — Progress Notes (Signed)
Patient presents today with flat affect and good mood for his monthly injection.Patient's Abilify Maintena 400 mg IM injectionwasprepared as ordered and given to patient in hisright upper outer gluteal area. Patient tolerated injection without any complaint of pain or discomfort and agreed to return in 4 weeks for next due injection. Patient denied any suicidal or homicidal ideations, no auditory or visual hallucinations and no plan or intent to harm self or others. Patient stated his mood has remained improved and mostly level since going on Abilify Maintena an no problems with the medications or side effects at this time. Patient scheduled next return injection visit for the coming month and will call if any problems prior till then. 

## 2019-05-29 DIAGNOSIS — R69 Illness, unspecified: Secondary | ICD-10-CM | POA: Diagnosis not present

## 2019-06-06 ENCOUNTER — Telehealth (INDEPENDENT_AMBULATORY_CARE_PROVIDER_SITE_OTHER): Payer: Medicare HMO | Admitting: Adult Health

## 2019-06-06 ENCOUNTER — Other Ambulatory Visit: Payer: Self-pay

## 2019-06-06 DIAGNOSIS — R059 Cough, unspecified: Secondary | ICD-10-CM

## 2019-06-06 DIAGNOSIS — R05 Cough: Secondary | ICD-10-CM | POA: Diagnosis not present

## 2019-06-06 MED ORDER — ALBUTEROL SULFATE HFA 108 (90 BASE) MCG/ACT IN AERS: 2.0000 | INHALATION_SPRAY | Freq: Four times a day (QID) | RESPIRATORY_TRACT | 1 refills | Status: DC | PRN

## 2019-06-06 MED ORDER — PREDNISONE 50 MG PO TABS: ORAL_TABLET | ORAL | 0 refills | Status: DC

## 2019-06-06 MED ORDER — HYDROCODONE-HOMATROPINE 5-1.5 MG/5ML PO SYRP: 5.0000 mL | ORAL_SOLUTION | Freq: Three times a day (TID) | ORAL | 0 refills | Status: DC | PRN

## 2019-06-06 NOTE — Progress Notes (Signed)
Virtual Visit via Video Note  I connected with Sean Hill  on 06/06/19 at  1:30 PM EDT by a video enabled telemedicine application and verified that I am speaking with the correct person using two identifiers.  Location patient: home Location provider:work or home office Persons participating in the virtual visit: patient, provider  I discussed the limitations of evaluation and management by telemedicine and the availability of in person appointments. The patient expressed understanding and agreed to proceed.   HPI: 78 year old male who is being evaluated today for an acute issue.  He reports that his symptoms have been present for a week.  Symptoms include a deep cough that is almost always nonproductive, some worsening shortness of breath, and mild chest tightness.  He denies fevers, chills, headaches, chest pain, or feeling acutely ill besides the cough.   ROS: See pertinent positives and negatives per HPI.  Past Medical History:  Diagnosis Date  . Anxiety   . Arthritis    right wrist; pt. states "everywhere"  . Bipolar disorder ([place])   . Depression   . GERD (gastroesophageal reflux disease)   . History of kidney stones   . 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 Hyperlipidemia   . Hypertension    states under control with med., has been on med. x 1 yr.  . Impaired hearing   . Left bundle branch block (LBBB) on electrocardiogram 10/29/2015  . Macular degeneration (senile) of retina    left    Past Surgical History:  Procedure Laterality Date  . ANKLE FUSION Bilateral   . ANKLE SURGERY Bilateral    ligament surgery  . CARDIAC CATHETERIZATION  x 2   1983; 11/14/2003  . CARPOMETACARPEL SUSPENSION PLASTY Right 09/22/2016   Procedure: right thumb carpometacarpal  ARTHROPLASTY;  Surgeon: Leanora Cover, MD;  Location: [place];  Service: Orthopedics;  Laterality: Right;  right thumb carpometacarpal  ARTHROPLASTY  . CARPOMETACARPEL  SUSPENSION PLASTY Right 11/02/2017   Procedure: RIGHT THUMB SUSPENSIONPLASTY WITH TIGHTROPE, DISTAL POLE SCAPHOID  EXCISION;  Surgeon: Leanora Cover, MD;  Location: [place];  Service: Orthopedics;  Laterality: Right;  . ELBOW SURGERY Left   . INGUINAL HERNIA REPAIR Right   . OPEN REDUCTION INTERNAL FIXATION (ORIF) DISTAL RADIAL FRACTURE Right 10/29/2015   Procedure: OPEN REDUCTION INTERNAL FIXATION (ORIF) DISTAL RADIAL FRACTURE;  Surgeon: Leanora Cover, MD;  Location: [place];  Service: Orthopedics;  Laterality: Right;  . ORIF ELBOW FRACTURE Right   . TONSILLECTOMY    . TOTAL KNEE ARTHROPLASTY Bilateral   . ULNAR COLLATERAL LIGAMENT REPAIR Right 12/29/2015   Procedure: REPAIR  RIGHT LATERAL ULNAR COLLATERAL LIGAMENT TEAR  EXTENSOR ORIGIN ;  Surgeon: Leanora Cover, MD;  Location: [place];  Service: Orthopedics;  Laterality: Right;  . WRIST ARTHROSCOPY WITH DEBRIDEMENT Right 07/14/2016   Procedure: RIGHT WRIST ARTHROSCOPY WITH DEBRIDEMENT  TRIANGULAR FIBROCARTILAGE COMPLEX;  Surgeon: Leanora Cover, MD;  Location: [place];  Service: Orthopedics;  Laterality: Right;    Family History  Problem Relation Age of Onset  . Alcohol abuse Mother   . Ovarian cancer Mother   . Alcohol abuse Father   . Pancreatic cancer Father   . Depression Daughter   . Paranoid behavior Daughter   . Hyperlipidemia Brother      Current Outpatient Medications:  .  albuterol (PROVENTIL HFA;VENTOLIN HFA) 108 (90 Base) MCG/ACT inhaler, Inhale 2 puffs into the lungs every 6 (six)  hours as needed for wheezing or shortness of breath., Disp: 1 Inhaler, Rfl: 2 .  ARIPiprazole ER (ABILIFY MAINTENA) 400 MG PRSY prefilled syringe, Inject 400 mg into the muscle every 28 (twenty-eight) days., Disp: 1 each, Rfl: 2 .  aspirin 81 MG tablet, Take 81 mg by mouth daily., Disp: , Rfl:  .  atorvastatin (LIPITOR) 40 MG tablet, TAKE 1 TABLET BY MOUTH EVERY DAY, Disp: 90 tablet,  Rfl: 1 .  benztropine (COGENTIN) 1 MG tablet, Take 1 tablet (1 mg total) by mouth daily., Disp: 90 tablet, Rfl: 0 .  citalopram (CELEXA) 20 MG tablet, Take 1 tablet (20 mg total) by mouth daily., Disp: 90 tablet, Rfl: 0 .  fenofibrate (TRICOR) 145 MG tablet, Take 1 tablet (145 mg total) by mouth daily., Disp: 90 tablet, Rfl: 3 .  lisinopril (PRINIVIL,ZESTRIL) 20 MG tablet, Take 20 mg by mouth daily., Disp: , Rfl:  .  Multiple Vitamin (MULTIVITAMIN WITH MINERALS) TABS tablet, Take 1 tablet by mouth daily., Disp: , Rfl:  .  Omega-3 Fatty Acids (FISH OIL PO), Take by mouth., Disp: , Rfl:  .  omeprazole (PRILOSEC) 40 MG capsule, TAKE 1 CAPSULE BY MOUTH TWICE A DAY, Disp: 180 capsule, Rfl: 2 .  temazepam (RESTORIL) 30 MG capsule, Take 1 capsule (30 mg total) by mouth at bedtime as needed. for sleep, Disp: 90 capsule, Rfl: 0 .  traZODone (DESYREL) 100 MG tablet, Take 1/2 to one tab at bed time, Disp: 90 tablet, Rfl: 0 .  Vitamin D, Cholecalciferol, 1000 UNITS TABS, Take 1 tablet by mouth daily., Disp: , Rfl:   Current Facility-Administered Medications:  .  ARIPiprazole ER (ABILIFY MAINTENA) injection 400 mg, 400 mg, Intramuscular, Q28 days, Arfeen, Syed T, MD, 400 mg at 05/21/19 1500 .  ipratropium-albuterol (DUONEB) 0.5-2.5 (3) MG/3ML nebulizer solution 3 mL, 3 mL, Nebulization, Once, Jeana Kersting, Tommi Rumps, NP  EXAM:  VITALS per patient if applicable:  GENERAL: alert, oriented, appears well and in no acute distress  HEENT: atraumatic, conjunttiva clear, no obvious abnormalities on inspection of external nose and ears  NECK: normal movements of the head and neck  LUNGS: on inspection no signs of respiratory distress, breathing rate appears normal, no obvious gross SOB, gasping or wheezing.  Dry croupy cough noted  CV: no obvious cyanosis  MS: moves all visible extremities without noticeable abnormality  PSYCH/NEURO: pleasant and cooperative, no obvious depression or anxiety, speech and thought  processing grossly intact  ASSESSMENT AND PLAN:  Discussed the following assessment and plan:  1. Cough - likely bronchitis. Will treat with prednisone and inhaler. Advised to get humidifier and place at bedside.  - Follow up if no improvement in the next 2-3 days  - predniSONE (DELTASONE) 50 MG tablet; Take one tab daily  Dispense: 5 tablet; Refill: 0 - albuterol (VENTOLIN HFA) 108 (90 Base) MCG/ACT inhaler; Inhale 2 puffs into the lungs every 6 (six) hours as needed for wheezing or shortness of breath.  Dispense: 6.7 g; Refill: 1 - HYDROcodone-homatropine (HYCODAN) 5-1.5 MG/5ML syrup; Take 5 mLs by mouth every 8 (eight) hours as needed for cough.  Dispense: 120 mL; Refill: 0    I discussed the assessment and treatment plan with the patient. The patient was provided an opportunity to ask questions and all were answered. The patient agreed with the plan and demonstrated an understanding of the instructions.   The patient was advised to call back or seek an in-person evaluation if the symptoms worsen or if the condition fails to  improve as anticipated.   [name], NP

## 2019-06-11 DIAGNOSIS — F411 Generalized anxiety disorder: Secondary | ICD-10-CM | POA: Diagnosis not present

## 2019-06-11 DIAGNOSIS — R69 Illness, unspecified: Secondary | ICD-10-CM | POA: Diagnosis not present

## 2019-06-19 ENCOUNTER — Other Ambulatory Visit (HOSPITAL_COMMUNITY): Payer: Self-pay | Admitting: Psychiatry

## 2019-06-19 DIAGNOSIS — F411 Generalized anxiety disorder: Secondary | ICD-10-CM

## 2019-06-19 DIAGNOSIS — F319 Bipolar disorder, unspecified: Secondary | ICD-10-CM

## 2019-06-21 ENCOUNTER — Ambulatory Visit (INDEPENDENT_AMBULATORY_CARE_PROVIDER_SITE_OTHER): Payer: Medicare HMO

## 2019-06-21 ENCOUNTER — Other Ambulatory Visit: Payer: Self-pay

## 2019-06-21 DIAGNOSIS — F3170 Bipolar disorder, currently in remission, most recent episode unspecified: Secondary | ICD-10-CM

## 2019-06-21 DIAGNOSIS — R69 Illness, unspecified: Secondary | ICD-10-CM | POA: Diagnosis not present

## 2019-06-21 NOTE — Progress Notes (Signed)
Patient presents today with flat affect and good mood for his monthly injection.Patient's Abilify Maintena 400 mg IM injectionwasprepared as ordered and given to patient in hisleftupper outer gluteal area. Patient tolerated injection without any complaint of pain or discomfort and agreed to return in 4 weeks for next due injection. Patient denied any suicidal or homicidal ideations, no auditory or visual hallucinations and no plan or intent to harm self or others. Patient stated his mood has remained improved and mostly level since going on Abilify Maintena an no problems with the medications or side effects at this time. Patient scheduled next return injection visit for the coming month and will call if any problems prior till then 

## 2019-06-25 ENCOUNTER — Ambulatory Visit (INDEPENDENT_AMBULATORY_CARE_PROVIDER_SITE_OTHER): Payer: Medicare HMO | Admitting: Psychiatry

## 2019-06-25 ENCOUNTER — Other Ambulatory Visit: Payer: Self-pay

## 2019-06-25 ENCOUNTER — Encounter (HOSPITAL_COMMUNITY): Payer: Self-pay | Admitting: Psychiatry

## 2019-06-25 DIAGNOSIS — F411 Generalized anxiety disorder: Secondary | ICD-10-CM | POA: Diagnosis not present

## 2019-06-25 DIAGNOSIS — F319 Bipolar disorder, unspecified: Secondary | ICD-10-CM | POA: Diagnosis not present

## 2019-06-25 DIAGNOSIS — R69 Illness, unspecified: Secondary | ICD-10-CM | POA: Diagnosis not present

## 2019-06-25 MED ORDER — CITALOPRAM HYDROBROMIDE 20 MG PO TABS: 20.0000 mg | ORAL_TABLET | Freq: Every day | ORAL | 0 refills | Status: DC

## 2019-06-25 MED ORDER — BENZTROPINE MESYLATE 1 MG PO TABS: 1.0000 mg | ORAL_TABLET | Freq: Every day | ORAL | 0 refills | Status: DC

## 2019-06-25 MED ORDER — ARIPIPRAZOLE ER 400 MG IM PRSY: 400.0000 mg | PREFILLED_SYRINGE | INTRAMUSCULAR | 2 refills | Status: DC

## 2019-06-25 MED ORDER — TEMAZEPAM 30 MG PO CAPS: 30.0000 mg | ORAL_CAPSULE | Freq: Every evening | ORAL | 0 refills | Status: DC | PRN

## 2019-06-25 MED ORDER — TRAZODONE HCL 50 MG PO TABS: 50.0000 mg | ORAL_TABLET | Freq: Every day | ORAL | 0 refills | Status: DC

## 2019-06-25 NOTE — Progress Notes (Signed)
Virtual Visit via Telephone Note  I connected with Sean Hill on 06/25/19 at 11:00 AM EDT by telephone and verified that I am speaking with the correct person using two identifiers.   I discussed the limitations, risks, security and privacy concerns of performing an evaluation and management service by telephone and the availability of in person appointments. I also discussed with the patient that there may be a patient responsible charge related to this service. The patient expressed understanding and agreed to proceed.   History of Present Illness: Patient was evaluated by phone session.  On his last visit we recommended to decrease trazodone to 50 mg because complaining of dry mouth and dizziness next day.  He is feeling much better since the dose decreased.  He is taking temazepam and trazodone 50 mg only which is helping him.  He denies any irritability, anger, mania or any psychosis.  He is getting Abilify injection once a month which is helping his mood mania and depression.  He admitted mild tremors but they are chronic and does not interfere in his daily activities.  He does not leave the house unless it is important.  He wants to continue current medication.  Is compliant with Celexa, trazodone, Cogentin and injection Abilify.  Denies drinking or using any illegal substances.  He lives with his wife who is very supportive.   Past Psychiatric History:Reviewed. H/Omultiple hospitalization. Last hospitalization in July 2014.H/Ooverdose onLatudawith alcohol.H/Ocutting wrist. Took Tegretol, Depakote, Ambien, Remeron, Vistaril, Geodon, Abilify, lithium, Provigil, Zoloft, Neurontin, Lamictal, Wellbutrin, Risperdal and Pristiq. H/Omania,aggression, getting speeding tickets, excessive buying and impulsive behavior.  Psychiatric Specialty Exam: Physical Exam  ROS  There were no vitals taken for this visit.There is no height or weight on file to calculate BMI.  General  Appearance: NA  Eye Contact:  NA  Speech:  Clear and Coherent and Normal Rate  Volume:  Normal  Mood:  Euthymic  Affect:  NA  Thought Process:  Goal Directed  Orientation:  Full (Time, Place, and Person)  Thought Content:  WDL and Logical  Suicidal Thoughts:  No  Homicidal Thoughts:  No  Memory:  Immediate;   Good Recent;   Good Remote;   Good  Judgement:  Good  Insight:  Good  Psychomotor Activity:  NA  Concentration:  Concentration: Fair and Attention Span: Good  Recall:  Good  Fund of Knowledge:  Good  Language:  Good  Akathisia:  No  Handed:  Right  AIMS (if indicated):     Assets:  Communication Skills Desire for Improvement Housing Resilience Social Support  ADL's:  Intact  Cognition:  WNL  Sleep:   ok      Assessment and Plan: Bipolar disorder type I.  Generalized anxiety disorder.  Patient doing better since trazodone dose decreased to 50 mg only.  He has no side effects he has no more dizziness and dry mouth.  Continue trazodone 50 mg at bedtime, Abilify injection 400 mg intramuscular every 4 weeks, Cogentin 1 mg at bedtime, temazepam 30 mg at bedtime and Celexa 20 mg daily.  Patient is getting therapy with [name] and encouraged to continue that.  Recommended to call us back if he is any question or any concern.  Follow-up in 3 months.  Follow Up Instructions:    I discussed the assessment and treatment plan with the patient. The patient was provided an opportunity to ask questions and all were answered. The patient agreed with the plan and demonstrated an understanding of  the instructions.   The patient was advised to call back or seek an in-person evaluation if the symptoms worsen or if the condition fails to improve as anticipated.  I provided 20 minutes of non-face-to-face time during this encounter.   [name], MD

## 2019-06-28 ENCOUNTER — Other Ambulatory Visit (HOSPITAL_COMMUNITY): Payer: Self-pay | Admitting: Psychiatry

## 2019-06-28 DIAGNOSIS — F319 Bipolar disorder, unspecified: Secondary | ICD-10-CM

## 2019-07-02 DIAGNOSIS — F411 Generalized anxiety disorder: Secondary | ICD-10-CM | POA: Diagnosis not present

## 2019-07-02 DIAGNOSIS — R69 Illness, unspecified: Secondary | ICD-10-CM | POA: Diagnosis not present

## 2019-07-03 ENCOUNTER — Ambulatory Visit: Payer: Self-pay | Admitting: *Deleted

## 2019-07-03 NOTE — Telephone Encounter (Signed)
Will monitor for ED arrival.  

## 2019-07-03 NOTE — Telephone Encounter (Signed)
Patient is calling to report his SOB is getting worse. Inhaler seems to help some- but does not last. SOB with exertion and sitting at times. Advised ED for evaluation of symptoms.  Reason for Disposition . [1] MODERATE difficulty breathing (e.g., speaks in phrases, SOB even at rest, pulse 100-120) AND [2] NEW-onset or WORSE than normal  Answer Assessment - Initial Assessment Questions 1. RESPIRATORY STATUS: "Describe your breathing?" (e.g., wheezing, shortness of breath, unable to speak, severe coughing)      SOB with exertion and sometimes with sitting- cough still present- some better 2. ONSET: "When did this breathing problem begin?"      9/17- visit for same symptoms- patient was treated with cough medication, albuterol 3. PATTERN "Does the difficult breathing come and go, or has it been constant since it started?"      Comes and goes- does get better at times 4. SEVERITY: "How bad is your breathing?" (e.g., mild, moderate, severe)    - MILD: No SOB at rest, mild SOB with walking, speaks normally in sentences, can lay down, no retractions, pulse < 100.    - MODERATE: SOB at rest, SOB with minimal exertion and prefers to sit, cannot lie down flat, speaks in phrases, mild retractions, audible wheezing, pulse 100-120.    - SEVERE: Very SOB at rest, speaks in single words, struggling to breathe, sitting hunched forward, retractions, pulse > 120      moderate 5. RECURRENT SYMPTOM: "Have you had difficulty breathing before?" If so, ask: "When was the last time?" and "What happened that time?"      At last visit- patient was treated for cough mainly- SOB is worse 6. CARDIAC HISTORY: "Do you have any history of heart disease?" (e.g., heart attack, angina, bypass surgery, angioplasty)      Yes- blockage history 7. LUNG HISTORY: "Do you have any history of lung disease?"  (e.g., pulmonary embolus, asthma, emphysema)     Asthma as child 8. CAUSE: "What do you think is causing the breathing problem?"       Not sure- overweight, age 78. OTHER SYMPTOMS: "Do you have any other symptoms? (e.g., dizziness, runny nose, cough, chest pain, fever)     Sometimes ring gets tight on hand 10. PREGNANCY: "Is there any chance you are pregnant?" "When was your last menstrual period?"       n/a 11. TRAVEL: "Have you traveled out of the country in the last month?" (e.g., travel history, exposures)       no  Protocols used: BREATHING DIFFICULTY-A-AH

## 2019-07-04 ENCOUNTER — Other Ambulatory Visit: Payer: Self-pay

## 2019-07-04 ENCOUNTER — Telehealth (INDEPENDENT_AMBULATORY_CARE_PROVIDER_SITE_OTHER): Payer: Medicare HMO | Admitting: Adult Health

## 2019-07-04 DIAGNOSIS — R0609 Other forms of dyspnea: Secondary | ICD-10-CM

## 2019-07-04 DIAGNOSIS — R0600 Dyspnea, unspecified: Secondary | ICD-10-CM | POA: Diagnosis not present

## 2019-07-04 DIAGNOSIS — R059 Cough, unspecified: Secondary | ICD-10-CM

## 2019-07-04 DIAGNOSIS — R05 Cough: Secondary | ICD-10-CM

## 2019-07-04 DIAGNOSIS — K2100 Gastro-esophageal reflux disease with esophagitis, without bleeding: Secondary | ICD-10-CM

## 2019-07-04 MED ORDER — PREDNISONE 10 MG PO TABS: ORAL_TABLET | ORAL | 0 refills | Status: DC

## 2019-07-04 MED ORDER — PANTOPRAZOLE SODIUM 40 MG PO TBEC: 40.0000 mg | DELAYED_RELEASE_TABLET | Freq: Every day | ORAL | 3 refills | Status: DC

## 2019-07-04 NOTE — Telephone Encounter (Signed)
Pt scheduled for virtual visit with [facility] today at 1:30 PM.

## 2019-07-04 NOTE — Telephone Encounter (Signed)
Per chart pt did not seek evaluation at [facility] system ED.

## 2019-07-04 NOTE — Progress Notes (Signed)
Virtual Visit via Video Note  I connected with Sean Hill  on 07/04/19 at  1:30 PM EDT by a video enabled telemedicine application and verified that I am speaking with the correct person using two identifiers.  Location patient: home Location provider:work or home office Persons participating in the virtual visit: patient, provider  I discussed the limitations of evaluation and management by telemedicine and the availability of in person appointments. The patient expressed understanding and agreed to proceed.   HPI:  78 year old male who is being evaluated today for an acute on chronic issue of nonproductive cough and shortness of breath with exertion.  He was last treated in September with a prednisone course, Hycodan cough syrup, and albuterol inhaler.  He reports that his symptoms improved but the cough never went away.  Today he reports that over the last few weeks the cough has become worse again and is becoming more shortness of breath with exertion and endorses hoarseness.   Does not feel short of breath necessarily with rest.  He has been monitoring his oxygen saturations at home and reports readings between 95 and 98%.  Denies fevers or chills, chest pain.  He has been using his albuterol inhaler from time to time and does notice momentary improvement but this only lasts a short period.  ROS: See pertinent positives and negatives per HPI.  Past Medical History:  Diagnosis Date  . Anxiety   . Arthritis    right wrist; pt. states "everywhere"  . Bipolar disorder ([place])   . Depression   . GERD (gastroesophageal reflux disease)   . History of kidney stones   . 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 Hyperlipidemia   . Hypertension    states under control with med., has been on med. x 1 yr.  . Impaired hearing   . Left bundle branch block (LBBB) on electrocardiogram 10/29/2015  . Macular degeneration (senile) of retina    left    Past Surgical  History:  Procedure Laterality Date  . ANKLE FUSION Bilateral   . ANKLE SURGERY Bilateral    ligament surgery  . CARDIAC CATHETERIZATION  x 2   1983; 11/14/2003  . CARPOMETACARPEL SUSPENSION PLASTY Right 09/22/2016   Procedure: right thumb carpometacarpal  ARTHROPLASTY;  Surgeon: Leanora Cover, MD;  Location: [place];  Service: Orthopedics;  Laterality: Right;  right thumb carpometacarpal  ARTHROPLASTY  . CARPOMETACARPEL SUSPENSION PLASTY Right 11/02/2017   Procedure: RIGHT THUMB SUSPENSIONPLASTY WITH TIGHTROPE, DISTAL POLE SCAPHOID  EXCISION;  Surgeon: Leanora Cover, MD;  Location: [place];  Service: Orthopedics;  Laterality: Right;  . ELBOW SURGERY Left   . INGUINAL HERNIA REPAIR Right   . OPEN REDUCTION INTERNAL FIXATION (ORIF) DISTAL RADIAL FRACTURE Right 10/29/2015   Procedure: OPEN REDUCTION INTERNAL FIXATION (ORIF) DISTAL RADIAL FRACTURE;  Surgeon: Leanora Cover, MD;  Location: [place];  Service: Orthopedics;  Laterality: Right;  . ORIF ELBOW FRACTURE Right   . TONSILLECTOMY    . TOTAL KNEE ARTHROPLASTY Bilateral   . ULNAR COLLATERAL LIGAMENT REPAIR Right 12/29/2015   Procedure: REPAIR  RIGHT LATERAL ULNAR COLLATERAL LIGAMENT TEAR  EXTENSOR ORIGIN ;  Surgeon: Leanora Cover, MD;  Location: [place];  Service: Orthopedics;  Laterality: Right;  . WRIST ARTHROSCOPY WITH DEBRIDEMENT Right 07/14/2016   Procedure: RIGHT WRIST ARTHROSCOPY WITH DEBRIDEMENT  TRIANGULAR FIBROCARTILAGE COMPLEX;  Surgeon: Leanora Cover, MD;  Location: [place];  Service: Orthopedics;  Laterality: Right;    Family History  Problem Relation Age of Onset  . Alcohol abuse Mother   . Ovarian cancer Mother   . Alcohol abuse Father   . Pancreatic cancer Father   . Depression Daughter   . Paranoid behavior Daughter   . Hyperlipidemia Brother      Current Outpatient Medications:  .  albuterol (VENTOLIN HFA) 108 (90 Base) MCG/ACT inhaler,  Inhale 2 puffs into the lungs every 6 (six) hours as needed for wheezing or shortness of breath., Disp: 6.7 g, Rfl: 1 .  ARIPiprazole ER (ABILIFY MAINTENA) 400 MG PRSY prefilled syringe, Inject 400 mg into the muscle every 28 (twenty-eight) days., Disp: 1 each, Rfl: 2 .  aspirin 81 MG tablet, Take 81 mg by mouth daily., Disp: , Rfl:  .  atorvastatin (LIPITOR) 40 MG tablet, TAKE 1 TABLET BY MOUTH EVERY DAY, Disp: 90 tablet, Rfl: 1 .  benztropine (COGENTIN) 1 MG tablet, Take 1 tablet (1 mg total) by mouth daily., Disp: 90 tablet, Rfl: 0 .  citalopram (CELEXA) 20 MG tablet, Take 1 tablet (20 mg total) by mouth daily., Disp: 90 tablet, Rfl: 0 .  fenofibrate (TRICOR) 145 MG tablet, Take 1 tablet (145 mg total) by mouth daily., Disp: 90 tablet, Rfl: 3 .  HYDROcodone-homatropine (HYCODAN) 5-1.5 MG/5ML syrup, Take 5 mLs by mouth every 8 (eight) hours as needed for cough., Disp: 120 mL, Rfl: 0 .  lisinopril (PRINIVIL,ZESTRIL) 20 MG tablet, Take 20 mg by mouth daily., Disp: , Rfl:  .  Multiple Vitamin (MULTIVITAMIN WITH MINERALS) TABS tablet, Take 1 tablet by mouth daily., Disp: , Rfl:  .  Omega-3 Fatty Acids (FISH OIL PO), Take by mouth., Disp: , Rfl:  .  omeprazole (PRILOSEC) 40 MG capsule, TAKE 1 CAPSULE BY MOUTH TWICE A DAY, Disp: 180 capsule, Rfl: 2 .  predniSONE (DELTASONE) 50 MG tablet, Take one tab daily, Disp: 5 tablet, Rfl: 0 .  temazepam (RESTORIL) 30 MG capsule, Take 1 capsule (30 mg total) by mouth at bedtime as needed. for sleep, Disp: 90 capsule, Rfl: 0 .  traZODone (DESYREL) 50 MG tablet, Take 1 tablet (50 mg total) by mouth at bedtime., Disp: 90 tablet, Rfl: 0 .  Vitamin D, Cholecalciferol, 1000 UNITS TABS, Take 1 tablet by mouth daily., Disp: , Rfl:   Current Facility-Administered Medications:  .  ARIPiprazole ER (ABILIFY MAINTENA) injection 400 mg, 400 mg, Intramuscular, Q28 days, Arfeen, Arlyce Harman, MD, 400 mg at 06/21/19 1118  EXAM:  VITALS per patient if applicable:  GENERAL: alert,  oriented, appears well and in no acute distress  HEENT: atraumatic, conjunttiva clear, no obvious abnormalities on inspection of external nose and ears  NECK: normal movements of the head and neck  LUNGS: on inspection no signs of respiratory distress, breathing rate appears normal, no obvious gross SOB, gasping or wheezing  CV: no obvious cyanosis  MS: moves all visible extremities without noticeable abnormality  PSYCH/NEURO: pleasant and cooperative, no obvious depression or anxiety, speech and thought processing grossly intact  ASSESSMENT AND PLAN:  Discussed the following assessment and plan:  1. DOE (dyspnea on exertion) -Due to recurrent theme, cardiology evaluation has been done without acute findings.  Will send pulmonary for further evaluation and possible PFTs - Ambulatory referral to Pulmonology - predniSONE (DELTASONE) 10 MG tablet; 40 mg x 3 days, 20 mg x 3 days, 10 mg x 3 days  Dispense: 21 tablet; Refill: 0  2. Cough -In the past we have tried to  get him off ACE inhibitor but there was no improvement and he decided that he wanted to go back on this medication.  I do not think this is a cardiac etiology.  Could possibly be silent acid reflux causing his chronic cough and will try switching to Protonix from omeprazole.  Also refer to pulmonary for further evaluation - Ambulatory referral to Pulmonology - pantoprazole (PROTONIX) 40 MG tablet; Take 1 tablet (40 mg total) by mouth daily.  Dispense: 30 tablet; Refill: 3 - predniSONE (DELTASONE) 10 MG tablet; 40 mg x 3 days, 20 mg x 3 days, 10 mg x 3 days  Dispense: 21 tablet; Refill: 0  3. Gastroesophageal reflux disease with esophagitis without hemorrhage -Possible silent acid reflux.  Will change omeprazole to Protonix to see if this helps with his symptoms - pantoprazole (PROTONIX) 40 MG tablet; Take 1 tablet (40 mg total) by mouth daily.  Dispense: 30 tablet; Refill: 3     I discussed the assessment and treatment plan  with the patient. The patient was provided an opportunity to ask questions and all were answered. The patient agreed with the plan and demonstrated an understanding of the instructions.   The patient was advised to call back or seek an in-person evaluation if the symptoms worsen or if the condition fails to improve as anticipated.   [name], NP

## 2019-07-22 ENCOUNTER — Other Ambulatory Visit: Payer: Self-pay

## 2019-07-22 ENCOUNTER — Ambulatory Visit (INDEPENDENT_AMBULATORY_CARE_PROVIDER_SITE_OTHER): Payer: Medicare HMO

## 2019-07-22 DIAGNOSIS — F3131 Bipolar disorder, current episode depressed, mild: Secondary | ICD-10-CM | POA: Diagnosis not present

## 2019-07-22 DIAGNOSIS — R69 Illness, unspecified: Secondary | ICD-10-CM | POA: Diagnosis not present

## 2019-07-22 NOTE — Progress Notes (Signed)
Patient presents today with flat affect and good mood for his monthly injection.Patient's Abilify Maintena 400 mg IM injectionwasprepared as ordered and given to patient in hisleftupper outer gluteal area. Patient tolerated injection without any complaint of pain or discomfort and agreed to return in 4 weeks for next due injection. Patient denied any suicidal or homicidal ideations, no auditory or visual hallucinations and no plan or intent to harm self or others. Patient scheduled next return injection visit for the coming month and will call if any problems prior till then

## 2019-07-25 DIAGNOSIS — R69 Illness, unspecified: Secondary | ICD-10-CM | POA: Diagnosis not present

## 2019-07-25 DIAGNOSIS — F411 Generalized anxiety disorder: Secondary | ICD-10-CM | POA: Diagnosis not present

## 2019-07-26 DIAGNOSIS — H353211 Exudative age-related macular degeneration, right eye, with active choroidal neovascularization: Secondary | ICD-10-CM | POA: Diagnosis not present

## 2019-07-26 DIAGNOSIS — H353222 Exudative age-related macular degeneration, left eye, with inactive choroidal neovascularization: Secondary | ICD-10-CM | POA: Diagnosis not present

## 2019-08-06 ENCOUNTER — Telehealth: Payer: Self-pay | Admitting: *Deleted

## 2019-08-06 NOTE — Telephone Encounter (Signed)
He was referred to pulmonary last month. He should hear from them anytime

## 2019-08-06 NOTE — Telephone Encounter (Signed)
Pt notified of below and he will call.  Nothing further needed.

## 2019-08-06 NOTE — Telephone Encounter (Signed)
Copied from [place] [phone] [id number]. Topic: Referral - Request for Referral >> Aug 06, 2019 10:13 AM Yvette Rack wrote: Has patient seen PCP for this complaint? yes  *If NO, is insurance requiring patient see PCP for this issue before PCP can refer them? Referral for which specialty: Pulmonology Preferred provider/office: no specific provider Reason for referral: Pt requests referral due to ongoing cough

## 2019-08-06 NOTE — Telephone Encounter (Signed)
Note   Sent to [facility] pulmonary work - que  Their office will contact pt to schedule directly [phone]

## 2019-08-08 ENCOUNTER — Ambulatory Visit: Payer: Medicare HMO | Admitting: Pulmonary Disease

## 2019-08-08 ENCOUNTER — Other Ambulatory Visit: Payer: Self-pay

## 2019-08-08 ENCOUNTER — Encounter: Payer: Self-pay | Admitting: Gastroenterology

## 2019-08-08 ENCOUNTER — Encounter: Payer: Self-pay | Admitting: Pulmonary Disease

## 2019-08-08 VITALS — BP 120/68 | HR 80 | Temp 97.8°F | Ht 70.0 in | Wt 269.6 lb

## 2019-08-08 DIAGNOSIS — K219 Gastro-esophageal reflux disease without esophagitis: Secondary | ICD-10-CM

## 2019-08-08 DIAGNOSIS — R1319 Other dysphagia: Secondary | ICD-10-CM

## 2019-08-08 DIAGNOSIS — R1310 Dysphagia, unspecified: Secondary | ICD-10-CM

## 2019-08-08 DIAGNOSIS — R05 Cough: Secondary | ICD-10-CM | POA: Diagnosis not present

## 2019-08-08 DIAGNOSIS — R059 Cough, unspecified: Secondary | ICD-10-CM

## 2019-08-08 NOTE — Patient Instructions (Addendum)
Thank you for visiting Dr. Valeta Harms at [facility]. Today we recommend the following:  Orders Placed This Encounter  Procedures  . Ambulatory referral to Gastroenterology   Please continue all reflux measures as discussed.  Please continue protonix as prescribed. Go to protonix twice per day for two weeks.   Return in about 3 months (around 11/08/2019).    Please do your part to reduce the spread of COVID-19.

## 2019-08-08 NOTE — Progress Notes (Signed)
Synopsis: Referred in Nov 2020 for DOE by [name], NP  Subjective:   PATIENT ID: Sean Hill GENDER: male DOB: 21-May-1941, MRN: [id number]  Chief Complaint  Patient presents with  . Consult    [facility] for DOE. He reports he developed a cough about 2 months ago. He was given prednisone and antibitioc which helped but as soon as taper was finished it came back. Reports minimal mucous production. Denies chest pain. States his chest always feel tight and heavy. Cough is worse after eating and in the morning.     PMH of pneumonia, depression, bipolar, GERD. Cough for the past 3 months. He was seen by PCP and started on prednisone and it got a little better. Did not disappear. Never smoker. No significant seasonal allergies. He has had hay fever before. During the fall but the wife states that each fall he has symptoms. Currently on protonix for GERD.  Patient states he has significant trouble with eating.  He is dysphasic most of the time.  He gets food stuck when he tries to swallow.  And then he starts coughing.  He is unable to clear his throat.  Eventually his wife has to come around and slapped him on the back to help him clear what is hung.  Occasionally he feels stuff is not going down "the right pipe".  He has been taking his Protonix which was recently changed from omeprazole.  He is sleeping with a couple of pillows to keep his head up.  Unfortunately he is still continuing to eat the same foods that he is always ate.  He was counseled on reflux measures again today in the office.   Past Medical History:  Diagnosis Date  . Anxiety   . Arthritis    right wrist; pt. states "everywhere"  . Bipolar disorder ([place])   . Depression   . GERD (gastroesophageal reflux disease)   . History of kidney stones   . 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 Hyperlipidemia   . Hypertension    states under control with med., has been on med. x 1 yr.  . Impaired  hearing   . Left bundle branch block (LBBB) on electrocardiogram 10/29/2015  . Macular degeneration (senile) of retina    left     Family History  Problem Relation Age of Onset  . Alcohol abuse Mother   . Ovarian cancer Mother   . Alcohol abuse Father   . Pancreatic cancer Father   . Depression Daughter   . Paranoid behavior Daughter   . Hyperlipidemia Brother      Past Surgical History:  Procedure Laterality Date  . ANKLE FUSION Bilateral   . ANKLE SURGERY Bilateral    ligament surgery  . CARDIAC CATHETERIZATION  x 2   1983; 11/14/2003  . CARPOMETACARPEL SUSPENSION PLASTY Right 09/22/2016   Procedure: right thumb carpometacarpal  ARTHROPLASTY;  Surgeon: Leanora Cover, MD;  Location: [place];  Service: Orthopedics;  Laterality: Right;  right thumb carpometacarpal  ARTHROPLASTY  . CARPOMETACARPEL SUSPENSION PLASTY Right 11/02/2017   Procedure: RIGHT THUMB SUSPENSIONPLASTY WITH TIGHTROPE, DISTAL POLE SCAPHOID  EXCISION;  Surgeon: Leanora Cover, MD;  Location: [facility];  Service: Orthopedics;  Laterality: Right;  . ELBOW SURGERY Left   . INGUINAL HERNIA REPAIR Right   . OPEN REDUCTION INTERNAL FIXATION (ORIF) DISTAL RADIAL FRACTURE Right 10/29/2015   Procedure: OPEN REDUCTION INTERNAL FIXATION (ORIF) DISTAL RADIAL FRACTURE;  Surgeon:  Leanora Cover, MD;  Location: [place];  Service: Orthopedics;  Laterality: Right;  . ORIF ELBOW FRACTURE Right   . TONSILLECTOMY    . TOTAL KNEE ARTHROPLASTY Bilateral   . ULNAR COLLATERAL LIGAMENT REPAIR Right 12/29/2015   Procedure: REPAIR  RIGHT LATERAL ULNAR COLLATERAL LIGAMENT TEAR  EXTENSOR ORIGIN ;  Surgeon: Leanora Cover, MD;  Location: [place];  Service: Orthopedics;  Laterality: Right;  . WRIST ARTHROSCOPY WITH DEBRIDEMENT Right 07/14/2016   Procedure: RIGHT WRIST ARTHROSCOPY WITH DEBRIDEMENT  TRIANGULAR FIBROCARTILAGE COMPLEX;  Surgeon: Leanora Cover, MD;  Location: [place];  Service: Orthopedics;  Laterality: Right;    Social History   Socioeconomic History  . Marital status: Married    Spouse name: Not on file  . Number of children: Not on file  . Years of education: Not on file  . Highest education level: Not on file  Occupational History  . Not on file  Social Needs  . Financial resource strain: Not on file  . Food insecurity    Worry: Not on file    Inability: Not on file  . Transportation needs    Medical: Not on file    Non-medical: Not on file  Tobacco Use  . Smoking status: Never Smoker  . Smokeless tobacco: Never Used  Substance and Sexual Activity  . Alcohol use: Yes    Comment: occasionally  . Drug use: No  . Sexual activity: Not Currently    Birth control/protection: None  Lifestyle  . Physical activity    Days per week: Not on file    Minutes per session: Not on file  . Stress: Not on file  Relationships  . Social Herbalist on phone: Not on file    Gets together: Not on file    Attends religious service: Not on file    Active member of club or organization: Not on file    Attends meetings of clubs or organizations: Not on file    Relationship status: Not on file  . Intimate partner violence    Fear of current or ex partner: Not on file    Emotionally abused: Not on file    Physically abused: Not on file    Forced sexual activity: Not on file  Other Topics Concern  . Not on file  Social History Narrative   Married, lives in [place].  Retired Dance movement psychotherapist.   Two daughters, both married,3 grandchildren ( 72, 25, 1 year)    No regular exercise.  Never smoker.  No ETOH.  No drugs.     Allergies  Allergen Reactions  . Penicillins Hives    BLISTERS  . Codeine Rash     Outpatient Medications Prior to Visit  Medication Sig Dispense Refill  . albuterol (VENTOLIN HFA) 108 (90 Base) MCG/ACT inhaler Inhale 2 puffs into the lungs every 6 (six) hours as needed for wheezing or shortness of breath. 6.7 g  1  . ARIPiprazole ER (ABILIFY MAINTENA) 400 MG PRSY prefilled syringe Inject 400 mg into the muscle every 28 (twenty-eight) days. 1 each 2  . aspirin 81 MG tablet Take 81 mg by mouth daily.    Marland Kitchen atorvastatin (LIPITOR) 40 MG tablet TAKE 1 TABLET BY MOUTH EVERY DAY 90 tablet 1  . benztropine (COGENTIN) 1 MG tablet Take 1 tablet (1 mg total) by mouth daily. 90 tablet 0  . fenofibrate (TRICOR) 145 MG tablet Take 1 tablet (145 mg total) by  mouth daily. 90 tablet 3  . lisinopril (PRINIVIL,ZESTRIL) 20 MG tablet Take 20 mg by mouth daily.    . Multiple Vitamin (MULTIVITAMIN WITH MINERALS) TABS tablet Take 1 tablet by mouth daily.    . Omega-3 Fatty Acids (FISH OIL PO) Take by mouth.    . pantoprazole (PROTONIX) 40 MG tablet Take 1 tablet (40 mg total) by mouth daily. 30 tablet 3  . temazepam (RESTORIL) 30 MG capsule Take 1 capsule (30 mg total) by mouth at bedtime as needed. for sleep 90 capsule 0  . traZODone (DESYREL) 50 MG tablet Take 1 tablet (50 mg total) by mouth at bedtime. 90 tablet 0  . Vitamin D, Cholecalciferol, 1000 UNITS TABS Take 1 tablet by mouth daily.    Marland Kitchen HYDROcodone-homatropine (HYCODAN) 5-1.5 MG/5ML syrup Take 5 mLs by mouth every 8 (eight) hours as needed for cough. (Patient not taking: Reported on 08/08/2019) 120 mL 0  . predniSONE (DELTASONE) 10 MG tablet 40 mg x 3 days, 20 mg x 3 days, 10 mg x 3 days (Patient not taking: Reported on 08/08/2019) 21 tablet 0  . citalopram (CELEXA) 20 MG tablet Take 1 tablet (20 mg total) by mouth daily. (Patient not taking: Reported on 08/08/2019) 90 tablet 0   Facility-Administered Medications Prior to Visit  Medication Dose Route Frequency Provider Last Rate Last Dose  . ARIPiprazole ER (ABILIFY MAINTENA) injection 400 mg  400 mg Intramuscular Q28 days Arfeen, Arlyce Harman, MD   400 mg at 07/22/19 1311    Review of Systems  Constitutional: Negative for chills, fever, malaise/fatigue and weight loss.  HENT: Negative for hearing loss, sore throat and  tinnitus.   Eyes: Negative for blurred vision and double vision.  Respiratory: Positive for cough. Negative for hemoptysis, sputum production, shortness of breath, wheezing and stridor.   Cardiovascular: Negative for chest pain, palpitations, orthopnea, leg swelling and PND.  Gastrointestinal: Positive for abdominal pain and heartburn. Negative for constipation, diarrhea, nausea and vomiting.  Genitourinary: Negative for dysuria, hematuria and urgency.  Musculoskeletal: Negative for joint pain and myalgias.  Skin: Negative for itching and rash.  Neurological: Negative for dizziness, tingling, weakness and headaches.  Endo/Heme/Allergies: Negative for environmental allergies. Does not bruise/bleed easily.  Psychiatric/Behavioral: Negative for depression. The patient is not nervous/anxious and does not have insomnia.   All other systems reviewed and are negative.    Objective:  Physical Exam Vitals signs reviewed.  Constitutional:      General: He is not in acute distress.    Appearance: He is well-developed. He is obese.  HENT:     Head: Normocephalic and atraumatic.  Eyes:     General: No scleral icterus.    Conjunctiva/sclera: Conjunctivae normal.     Pupils: Pupils are equal, round, and reactive to light.  Neck:     Musculoskeletal: Neck supple.     Vascular: No JVD.     Trachea: No tracheal deviation.  Cardiovascular:     Rate and Rhythm: Normal rate and regular rhythm.     Heart sounds: Normal heart sounds. No murmur.  Pulmonary:     Effort: Pulmonary effort is normal. No tachypnea, accessory muscle usage or respiratory distress.     Breath sounds: Normal breath sounds. No stridor. No wheezing, rhonchi or rales.  Abdominal:     General: Bowel sounds are normal. There is no distension.     Palpations: Abdomen is soft.     Tenderness: There is no abdominal tenderness.     Comments: Obese  abdomen  Musculoskeletal:        General: No tenderness.  Lymphadenopathy:      Cervical: No cervical adenopathy.  Skin:    General: Skin is warm and dry.     Capillary Refill: Capillary refill takes less than 2 seconds.     Findings: No rash.  Neurological:     Mental Status: He is alert and oriented to person, place, and time.  Psychiatric:        Behavior: Behavior normal.      Vitals:   08/08/19 1504  BP: 120/68  Pulse: 80  Temp: 97.8 F (36.6 C)  TempSrc: Temporal  SpO2: 97%  Weight: 269 lb 9.6 oz (122.3 kg)  Height: 5\' 10"  (1.778 m)   97% on RA BMI Readings from Last 3 Encounters:  08/08/19 38.68 kg/m  11/07/18 38.40 kg/m  04/27/18 39.11 kg/m   Wt Readings from Last 3 Encounters:  08/08/19 269 lb 9.6 oz (122.3 kg)  11/07/18 260 lb (117.9 kg)  04/27/18 261 lb (118.4 kg)     CBC    Component Value Date/Time   WBC 5.6 11/07/2018 0741   RBC 5.10 11/07/2018 0741   HGB 14.6 11/07/2018 0741   HCT 43.2 11/07/2018 0741   PLT 240.0 11/07/2018 0741   MCV 84.7 11/07/2018 0741   MCH 29.3 10/29/2015 0913   MCHC 33.7 11/07/2018 0741   RDW 14.8 11/07/2018 0741   LYMPHSABS 2.0 11/07/2018 0741   MONOABS 0.4 11/07/2018 0741   EOSABS 0.2 11/07/2018 0741   BASOSABS 0.1 11/07/2018 0741      Chest Imaging: February 2020: Chest x-ray reviewed, no acute abnormalities. The patient's images have been independently reviewed by me.    Pulmonary Functions Testing Results: No flowsheet data found.  FeNO: none   Pathology: none   Echocardiogram: none   Heart Catheterization: none     Assessment & Plan:     ICD-10-CM   1. Cough  R05 Ambulatory referral to Gastroenterology  2. Gastroesophageal reflux disease without esophagitis  K21.9   3. Esophageal dysphagia  R13.10     Discussion:  78 year old morbidly obese gentleman with gastroesophageal reflux disease and esophageal dysphagia occasionally having food stick when he is trying to swallow and causing him to cough and bring food particles back up. I suspect this is the most underlying  reason for his current cough symptoms.  He does have seasonal allergies and fall seems to be the worst time.  I think the best evaluation for cough currently is to get his gastroesophageal reflux symptoms and dysphagia under control.  Plan: We will refer to GI for input regarding potential need for endoscopy. I have asked him to increase his Protonix to twice daily for the next 2 weeks and go back to once daily. If his cough is still a problem after we have addressed his GI issues that we can consider SLP evaluation with M BSS. As for his allergies he is currently not having any eye nose or upper respiratory symptoms except for the cough.  We can hold off on further evaluation for this at this time.  Patient to return to see [place] in 3 months.  Greater than 50% of this patient's 45-minute of visit was spent face-to-face discussing above recommendations and treatment plan.   Current Outpatient Medications:  .  albuterol (VENTOLIN HFA) 108 (90 Base) MCG/ACT inhaler, Inhale 2 puffs into the lungs every 6 (six) hours as needed for wheezing or shortness of breath., Disp: 6.7 g,  Rfl: 1 .  ARIPiprazole ER (ABILIFY MAINTENA) 400 MG PRSY prefilled syringe, Inject 400 mg into the muscle every 28 (twenty-eight) days., Disp: 1 each, Rfl: 2 .  aspirin 81 MG tablet, Take 81 mg by mouth daily., Disp: , Rfl:  .  atorvastatin (LIPITOR) 40 MG tablet, TAKE 1 TABLET BY MOUTH EVERY DAY, Disp: 90 tablet, Rfl: 1 .  benztropine (COGENTIN) 1 MG tablet, Take 1 tablet (1 mg total) by mouth daily., Disp: 90 tablet, Rfl: 0 .  fenofibrate (TRICOR) 145 MG tablet, Take 1 tablet (145 mg total) by mouth daily., Disp: 90 tablet, Rfl: 3 .  lisinopril (PRINIVIL,ZESTRIL) 20 MG tablet, Take 20 mg by mouth daily., Disp: , Rfl:  .  Multiple Vitamin (MULTIVITAMIN WITH MINERALS) TABS tablet, Take 1 tablet by mouth daily., Disp: , Rfl:  .  Omega-3 Fatty Acids (FISH OIL PO), Take by mouth., Disp: , Rfl:  .  pantoprazole (PROTONIX) 40 MG  tablet, Take 1 tablet (40 mg total) by mouth daily., Disp: 30 tablet, Rfl: 3 .  temazepam (RESTORIL) 30 MG capsule, Take 1 capsule (30 mg total) by mouth at bedtime as needed. for sleep, Disp: 90 capsule, Rfl: 0 .  traZODone (DESYREL) 50 MG tablet, Take 1 tablet (50 mg total) by mouth at bedtime., Disp: 90 tablet, Rfl: 0 .  Vitamin D, Cholecalciferol, 1000 UNITS TABS, Take 1 tablet by mouth daily., Disp: , Rfl:  .  HYDROcodone-homatropine (HYCODAN) 5-1.5 MG/5ML syrup, Take 5 mLs by mouth every 8 (eight) hours as needed for cough. (Patient not taking: Reported on 08/08/2019), Disp: 120 mL, Rfl: 0 .  predniSONE (DELTASONE) 10 MG tablet, 40 mg x 3 days, 20 mg x 3 days, 10 mg x 3 days (Patient not taking: Reported on 08/08/2019), Disp: 21 tablet, Rfl: 0  Current Facility-Administered Medications:  .  ARIPiprazole ER (ABILIFY MAINTENA) injection 400 mg, 400 mg, Intramuscular, Q28 days, Arfeen, Arlyce Harman, MD, 400 mg at 07/22/19 1311   Garner Nash, DO [facility] 08/08/2019 3:28 PM

## 2019-08-09 NOTE — Progress Notes (Signed)
Spoke to patient. He is scheduled for an office visit 08/26/2019

## 2019-08-20 ENCOUNTER — Ambulatory Visit (HOSPITAL_COMMUNITY): Payer: Medicare HMO | Admitting: Psychiatry

## 2019-08-20 ENCOUNTER — Ambulatory Visit (INDEPENDENT_AMBULATORY_CARE_PROVIDER_SITE_OTHER): Payer: Medicare HMO | Admitting: *Deleted

## 2019-08-20 ENCOUNTER — Encounter (HOSPITAL_COMMUNITY): Payer: Self-pay | Admitting: *Deleted

## 2019-08-20 ENCOUNTER — Other Ambulatory Visit: Payer: Self-pay

## 2019-08-20 DIAGNOSIS — R69 Illness, unspecified: Secondary | ICD-10-CM | POA: Diagnosis not present

## 2019-08-20 DIAGNOSIS — F3131 Bipolar disorder, current episode depressed, mild: Secondary | ICD-10-CM

## 2019-08-20 NOTE — Progress Notes (Signed)
Patient presents today with flat affect and good mood for his monthly injection.Patient's Abilify Maintena 400 mg IM injectionwasprepared as ordered and given to patient in hisright deltoid. Patient tolerated injection without any complaint of pain or discomfort and agreed to return in 4 weeks for next due injection. Patient denied any suicidal or homicidal ideations, no auditory or visual hallucinations and no plan or intent to harm self or others. Patient scheduled next return injection visit for the coming month and will call if any problems prior till then

## 2019-08-26 ENCOUNTER — Other Ambulatory Visit: Payer: Self-pay

## 2019-08-26 ENCOUNTER — Encounter: Payer: Self-pay | Admitting: Gastroenterology

## 2019-08-26 ENCOUNTER — Ambulatory Visit (INDEPENDENT_AMBULATORY_CARE_PROVIDER_SITE_OTHER): Payer: Medicare HMO | Admitting: Gastroenterology

## 2019-08-26 VITALS — BP 112/60 | HR 76 | Temp 98.7°F | Ht 69.0 in | Wt 263.4 lb

## 2019-08-26 DIAGNOSIS — K219 Gastro-esophageal reflux disease without esophagitis: Secondary | ICD-10-CM | POA: Diagnosis not present

## 2019-08-26 DIAGNOSIS — Z1159 Encounter for screening for other viral diseases: Secondary | ICD-10-CM

## 2019-08-26 DIAGNOSIS — Z1211 Encounter for screening for malignant neoplasm of colon: Secondary | ICD-10-CM | POA: Diagnosis not present

## 2019-08-26 DIAGNOSIS — Z6838 Body mass index (BMI) 38.0-38.9, adult: Secondary | ICD-10-CM

## 2019-08-26 DIAGNOSIS — R195 Other fecal abnormalities: Secondary | ICD-10-CM

## 2019-08-26 DIAGNOSIS — R1310 Dysphagia, unspecified: Secondary | ICD-10-CM

## 2019-08-26 DIAGNOSIS — R05 Cough: Secondary | ICD-10-CM

## 2019-08-26 DIAGNOSIS — R053 Chronic cough: Secondary | ICD-10-CM

## 2019-08-26 MED ORDER — PANTOPRAZOLE SODIUM 40 MG PO TBEC: 40.0000 mg | DELAYED_RELEASE_TABLET | Freq: Two times a day (BID) | ORAL | 3 refills | Status: DC

## 2019-08-26 MED ORDER — CLENPIQ 10-3.5-12 MG-GM -GM/160ML PO SOLN: 1.0000 | Freq: Once | ORAL | 0 refills | Status: AC

## 2019-08-26 NOTE — Progress Notes (Signed)
Chief Complaint: Dysphagia, reflux  Referring Provider:     Garner Nash, DO   HPI:    Sean Hill is a 78 y.o. male with a history of pneumonia, depression, obesity (BMI 38.9), bipolar disorder, referred to the Gastroenterology Clinic for evaluation of GERD and dysphagia.  Longstanding history of reflux for many years. Index sxs of hoarseness, chronic cough, regurgitation, increased belching. No HB. Worse with supine, spicy, tomato based sauces, fried foods. Sxs worse over the last 3-4 months or so. Was previously treated with omeprazole, but more recently changed to Protonix.  Sleeps with head elevated.  Has intentionally lost 6# over last few weeks and has noticed some improvement in symptoms.  Had been previously treated with inhalers for possible pulmonary etiology for chronic cough, without improvement.  Additionally, he c/o cough and possible dysphagia, which occurs immediatley after swallowing. Only with solids. Increasing frequency to a couple times/day now.  Difficulty passing food from mouth to esophagus.  No mid sternal symptoms.  Has required back slap from wife a few times, but no Heimlich maneuver. No ER evaluation/emergent EGD for this issue.  Was seen by Dr. Valeta Harms in the Pulmonary Clinic for chronic cough in 07/2019.  Increased Protonix to 40 mg bid x2 weeks and referred to GI.  Has noticed improvement in symptoms with high-dose PPI therapy.  Normal CBC and CMP in 10/2018.  No recent abdominal imaging for review.  No previous EGD or colonoscopy.  Of note, he had a +Cologuard in 12/2014.  He is otherwise without overt GI blood loss.  He and his wife had no knowledge of that result.  No previous colonoscopy.    Past Medical History:  Diagnosis Date  . Anal fissure   . Anxiety   . Arthritis    right wrist; pt. states "everywhere"  . Asthma   . Bipolar disorder ([place])   . Depression   . GERD (gastroesophageal reflux disease)   . History of  kidney stones   . 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 Hyperlipidemia   . Hypertension    states under control with med., has been on med. x 1 yr.  . Impaired hearing   . Left bundle branch block (LBBB) on electrocardiogram 10/29/2015  . Macular degeneration (senile) of retina    left     Past Surgical History:  Procedure Laterality Date  . ANKLE FUSION Bilateral   . ANKLE SURGERY Bilateral    ligament surgery  . CARDIAC CATHETERIZATION  x 2   1983; 11/14/2003  . CARPOMETACARPEL SUSPENSION PLASTY Right 09/22/2016   Procedure: right thumb carpometacarpal  ARTHROPLASTY;  Surgeon: Leanora Cover, MD;  Location: [facility];  Service: Orthopedics;  Laterality: Right;  right thumb carpometacarpal  ARTHROPLASTY  . CARPOMETACARPEL SUSPENSION PLASTY Right 11/02/2017   Procedure: RIGHT THUMB SUSPENSIONPLASTY WITH TIGHTROPE, DISTAL POLE SCAPHOID  EXCISION;  Surgeon: Leanora Cover, MD;  Location: [place];  Service: Orthopedics;  Laterality: Right;  . ELBOW SURGERY Left   . INGUINAL HERNIA REPAIR Right   . OPEN REDUCTION INTERNAL FIXATION (ORIF) DISTAL RADIAL FRACTURE Right 10/29/2015   Procedure: OPEN REDUCTION INTERNAL FIXATION (ORIF) DISTAL RADIAL FRACTURE;  Surgeon: Leanora Cover, MD;  Location: [place];  Service: Orthopedics;  Laterality: Right;  . ORIF ELBOW FRACTURE Right   . TONSILLECTOMY    . TOTAL KNEE ARTHROPLASTY Bilateral   .  ULNAR COLLATERAL LIGAMENT REPAIR Right 12/29/2015   Procedure: REPAIR  RIGHT LATERAL ULNAR COLLATERAL LIGAMENT TEAR  EXTENSOR ORIGIN ;  Surgeon: Leanora Cover, MD;  Location: [place];  Service: Orthopedics;  Laterality: Right;  . WRIST ARTHROSCOPY WITH DEBRIDEMENT Right 07/14/2016   Procedure: RIGHT WRIST ARTHROSCOPY WITH DEBRIDEMENT  TRIANGULAR FIBROCARTILAGE COMPLEX;  Surgeon: Leanora Cover, MD;  Location: [place];  Service: Orthopedics;  Laterality: Right;    Family History  Problem Relation Age of Onset  . Alcohol abuse Mother   . Ovarian cancer Mother   . Alcohol abuse Father   . Pancreatic cancer Father   . Depression Daughter   . Paranoid behavior Daughter   . Hyperlipidemia Brother   . Barrett's esophagus Brother   . Colon cancer Maternal Grandmother   . Esophageal cancer Neg Hx    Social History   Tobacco Use  . Smoking status: Never Smoker  . Smokeless tobacco: Never Used  Substance Use Topics  . Alcohol use: Yes    Comment: occasionally  . Drug use: No   Current Outpatient Medications  Medication Sig Dispense Refill  . albuterol (VENTOLIN HFA) 108 (90 Base) MCG/ACT inhaler Inhale 2 puffs into the lungs every 6 (six) hours as needed for wheezing or shortness of breath. 6.7 g 1  . Alum Hydroxide-Mag Carbonate (GAVISCON PO) Take 1 tablet by mouth 3 (three) times daily.    . ARIPiprazole ER (ABILIFY MAINTENA) 400 MG PRSY prefilled syringe Inject 400 mg into the muscle every 28 (twenty-eight) days. 1 each 2  . aspirin 81 MG tablet Take 81 mg by mouth daily.    Marland Kitchen atorvastatin (LIPITOR) 40 MG tablet TAKE 1 TABLET BY MOUTH EVERY DAY 90 tablet 1  . benztropine (COGENTIN) 1 MG tablet Take 1 tablet (1 mg total) by mouth daily. 90 tablet 0  . fenofibrate (TRICOR) 145 MG tablet Take 1 tablet (145 mg total) by mouth daily. 90 tablet 3  . lisinopril (PRINIVIL,ZESTRIL) 20 MG tablet Take 20 mg by mouth daily.    . Multiple Vitamin (MULTIVITAMIN WITH MINERALS) TABS tablet Take 1 tablet by mouth daily.    . Omega-3 Fatty Acids (FISH OIL PO) Take by mouth.    . pantoprazole (PROTONIX) 40 MG tablet Take 1 tablet (40 mg total) by mouth daily. (Patient taking differently: Take 40 mg by mouth 2 (two) times daily. ) 30 tablet 3  . temazepam (RESTORIL) 30 MG capsule Take 1 capsule (30 mg total) by mouth at bedtime as needed. for sleep 90 capsule 0  . traZODone (DESYREL) 50 MG tablet Take 1 tablet (50 mg total) by mouth at bedtime. 90 tablet 0  .  Vitamin D, Cholecalciferol, 1000 UNITS TABS Take 1 tablet by mouth daily.     Current Facility-Administered Medications  Medication Dose Route Frequency Provider Last Rate Last Dose  . ARIPiprazole ER (ABILIFY MAINTENA) injection 400 mg  400 mg Intramuscular Q28 days [name], MD   400 mg at 08/20/19 1559   Allergies  Allergen Reactions  . Penicillins Hives    BLISTERS  . Codeine Rash     Review of Systems: All systems reviewed and negative except where noted in HPI.     Physical Exam:    Wt Readings from Last 3 Encounters:  08/26/19 263 lb 6 oz (119.5 kg)  08/08/19 269 lb 9.6 oz (122.3 kg)  11/07/18 260 lb (117.9 kg)    BP 112/60   Pulse 76   Temp  98.7 F (37.1 C)   Ht 5\' 9"  (1.753 m)   Wt 263 lb 6 oz (119.5 kg)   BMI 38.89 kg/m  Constitutional:  Pleasant, in no acute distress. Psychiatric: Normal mood and affect. Behavior is normal. EENT: Pupils normal.  Conjunctivae are normal. No scleral icterus. Neck supple. No cervical LAD. Cardiovascular: Normal rate, regular rhythm. No edema Pulmonary/chest: Effort normal and breath sounds normal. No wheezing, rales or rhonchi. Abdominal: Soft, nondistended, nontender. Bowel sounds active throughout. There are no masses palpable. No hepatomegaly. Neurological: Alert and oriented to person place and time. Skin: Skin is warm and dry. No rashes noted.   ASSESSMENT AND PLAN;   1) Chronic cough 2) Dysphagia 3) GERD -Resume high-dose PPI therapy.  Protonix 40 mg bid refilled today -EGD with esophageal dilation -If reflux symptoms improved, but chronic cough remains, plan for MBS and Speech evaluation -Barrett's screening at time of EGD  4) History of positive Cologuard -Positive Cologuard in 12/2014, but patient and his wife had no knowledge of this.  Otherwise without overt GI blood loss. -Recommend colonoscopy, which he agrees  5) Obesity (BMI 38.9): -Has intentionally lost 6#over the last couple of weeks.   Applauded him on his efforts, and encouraged him to continue for overall health benefits as well as improvement in GI symptoms.  The indications, risks, and benefits of EGD and colonoscopy were explained to the patient in detail. Risks include but are not limited to bleeding, perforation, adverse reaction to medications, and cardiopulmonary compromise. Sequelae include but are not limited to the possibility of surgery, hositalization, and mortality. The patient verbalized understanding and wished to proceed. All questions answered, referred to scheduler and bowel prep ordered. Further recommendations pending results of the exam.     Lavena Bullion, DO, FACG  08/26/2019, 3:19 PM   Icard, Octavio Graves, DO

## 2019-08-26 NOTE — Patient Instructions (Signed)
You have been scheduled for an endoscopy and colonoscopy. Please follow the written instructions given to you at your visit today. Please pick up your prep supplies at the pharmacy within the next 1-3 days. If you use inhalers (even only as needed), please bring them with you on the day of your procedure. Your physician has requested that you go to www.startemmi.com and enter the access code given to you at your visit today. This web site gives a general overview about your procedure. However, you should still follow specific instructions given to you by our office regarding your preparation for the procedure.  It was a pleasure to see you today!  Vito Cirigliano, D.O.

## 2019-09-06 ENCOUNTER — Encounter: Payer: Self-pay | Admitting: Gastroenterology

## 2019-09-16 ENCOUNTER — Ambulatory Visit (INDEPENDENT_AMBULATORY_CARE_PROVIDER_SITE_OTHER): Payer: Medicare HMO

## 2019-09-16 ENCOUNTER — Other Ambulatory Visit: Payer: Self-pay | Admitting: Gastroenterology

## 2019-09-16 DIAGNOSIS — Z1159 Encounter for screening for other viral diseases: Secondary | ICD-10-CM

## 2019-09-16 LAB — SARS CORONAVIRUS 2 (TAT 6-24 HRS): SARS Coronavirus 2: NEGATIVE

## 2019-09-18 ENCOUNTER — Telehealth: Payer: Self-pay | Admitting: Family Medicine

## 2019-09-18 NOTE — Telephone Encounter (Signed)
Received a message from [facility] in [employer] on [address]  They are out of stock of patient's fenofibrate 145 mg.  They do not have an ETA on when medication will arrive.  Is it ok for pt to go without for awhile or would you like to write a new rx.  Please advise.

## 2019-09-18 NOTE — Telephone Encounter (Signed)
He will be fine being off this for the next few weeks until the pharmacy can get restocked

## 2019-09-18 NOTE — Telephone Encounter (Signed)
Mrs. Krahl aware of below message.  Nothing further needed.

## 2019-09-19 ENCOUNTER — Encounter: Payer: Self-pay | Admitting: Gastroenterology

## 2019-09-19 ENCOUNTER — Other Ambulatory Visit: Payer: Self-pay

## 2019-09-19 ENCOUNTER — Ambulatory Visit (AMBULATORY_SURGERY_CENTER): Payer: Medicare HMO | Admitting: Gastroenterology

## 2019-09-19 VITALS — BP 106/45 | HR 63 | Temp 98.5°F | Resp 12 | Ht 69.0 in | Wt 263.0 lb

## 2019-09-19 DIAGNOSIS — D122 Benign neoplasm of ascending colon: Secondary | ICD-10-CM | POA: Diagnosis not present

## 2019-09-19 DIAGNOSIS — K219 Gastro-esophageal reflux disease without esophagitis: Secondary | ICD-10-CM

## 2019-09-19 DIAGNOSIS — K3189 Other diseases of stomach and duodenum: Secondary | ICD-10-CM | POA: Diagnosis not present

## 2019-09-19 DIAGNOSIS — D124 Benign neoplasm of descending colon: Secondary | ICD-10-CM | POA: Diagnosis not present

## 2019-09-19 DIAGNOSIS — K5730 Diverticulosis of large intestine without perforation or abscess without bleeding: Secondary | ICD-10-CM | POA: Diagnosis not present

## 2019-09-19 DIAGNOSIS — K317 Polyp of stomach and duodenum: Secondary | ICD-10-CM | POA: Diagnosis not present

## 2019-09-19 DIAGNOSIS — K449 Diaphragmatic hernia without obstruction or gangrene: Secondary | ICD-10-CM

## 2019-09-19 DIAGNOSIS — K640 First degree hemorrhoids: Secondary | ICD-10-CM | POA: Diagnosis not present

## 2019-09-19 DIAGNOSIS — R053 Chronic cough: Secondary | ICD-10-CM

## 2019-09-19 DIAGNOSIS — R05 Cough: Secondary | ICD-10-CM

## 2019-09-19 DIAGNOSIS — K2950 Unspecified chronic gastritis without bleeding: Secondary | ICD-10-CM

## 2019-09-19 DIAGNOSIS — D125 Benign neoplasm of sigmoid colon: Secondary | ICD-10-CM | POA: Diagnosis not present

## 2019-09-19 DIAGNOSIS — K6389 Other specified diseases of intestine: Secondary | ICD-10-CM

## 2019-09-19 DIAGNOSIS — R1310 Dysphagia, unspecified: Secondary | ICD-10-CM | POA: Diagnosis not present

## 2019-09-19 DIAGNOSIS — D120 Benign neoplasm of cecum: Secondary | ICD-10-CM

## 2019-09-19 DIAGNOSIS — K2970 Gastritis, unspecified, without bleeding: Secondary | ICD-10-CM

## 2019-09-19 DIAGNOSIS — R195 Other fecal abnormalities: Secondary | ICD-10-CM | POA: Diagnosis not present

## 2019-09-19 DIAGNOSIS — K2100 Gastro-esophageal reflux disease with esophagitis, without bleeding: Secondary | ICD-10-CM | POA: Diagnosis not present

## 2019-09-19 DIAGNOSIS — D123 Benign neoplasm of transverse colon: Secondary | ICD-10-CM

## 2019-09-19 MED ORDER — SODIUM CHLORIDE 0.9 % IV SOLN: 500.0000 mL | Freq: Once | INTRAVENOUS | Status: DC

## 2019-09-19 NOTE — Op Note (Signed)
[facility] Patient Name: Sean Hill Procedure Date: 09/19/2019 8:50 AM MRN: [id number] Endoscopist: Gerrit Heck , MD Age: 78 Referring MD:  Date of Birth: 02/20/1941 Gender: Male Account #: [id number] Procedure:                Upper GI endoscopy Indications:              Dysphagia, Heartburn, Suspected esophageal reflux,                            Chronic cough, age appropriate Barrett's Esophagus                            screening                           78 yo male with a long-standing history of GERD,                            with worsening reflux sxs lately along with chronic                            cough. Started on high dose PPI with clinical                            improvement. Additionally with intermittent,                            worsening solid food dysphagia. Medicines:                Monitored Anesthesia Care Procedure:                Pre-Anesthesia Assessment:                           - Prior to the procedure, a History and Physical                            was performed, and patient medications and                            allergies were reviewed. The patient's tolerance of                            previous anesthesia was also reviewed. The risks                            and benefits of the procedure and the sedation                            options and risks were discussed with the patient.                            All questions were answered, and informed consent  was obtained. Prior Anticoagulants: The patient has                            taken no previous anticoagulant or antiplatelet                            agents. ASA Grade Assessment: II - A patient with                            mild systemic disease. After reviewing the risks                            and benefits, the patient was deemed in                            satisfactory condition to undergo the procedure.             After obtaining informed consent, the endoscope was                            passed under direct vision. Throughout the                            procedure, the patient's blood pressure, pulse, and                            oxygen saturations were monitored continuously. The                            Endoscope was introduced through the mouth, and                            advanced to the second part of duodenum. The upper                            GI endoscopy was accomplished without difficulty.                            The patient tolerated the procedure well. Scope In: Scope Out: Findings:                 Esophagogastric landmarks were identified: the                            Z-line was found at 39 cm, the gastroesophageal                            junction was found at 39 cm and the site of hiatal                            narrowing was found at 42 cm from the incisors.                           A 3 cm hiatal hernia was present.  The Z-line was irregular and was found 39 cm from                            the incisors. Biopsies were taken with a cold                            forceps for histology. Estimated blood loss was                            minimal.                           The upper third of the esophagus and middle third                            of the esophagus were normal. A guidewire was                            placed and the scope was withdrawn. Dilation was                            performed with a Savary dilator with mild                            resistance at 51 Fr. The dilation site was examined                            following endoscope reinsertion and showed no                            bleeding, mucosal tear or perforation. Estimated                            blood loss: none.                           A few 2 to 5 mm sessile polyps with no bleeding and                            no stigmata of  recent bleeding were found in the                            gastric fundus and in the gastric body. These                            polyps were removed with a cold biopsy forceps.                            Resection and retrieval were complete. Estimated                            blood loss was minimal.  Scattered mild inflammation characterized by                            erythema was found in the gastric body and in the                            gastric antrum. Biopsies were taken with a cold                            forceps for Helicobacter pylori testing. Estimated                            blood loss was minimal.                           The duodenal bulb, first portion of the duodenum                            and second portion of the duodenum were normal.                           A small amount of food (residue) was found in the                            gastric fundus, in the gastric body and in the                            gastric antrum. Complications:            No immediate complications. Estimated Blood Loss:     Estimated blood loss was minimal. Impression:               - Esophagogastric landmarks identified.                           - 3 cm hiatal hernia.                           - Z-line irregular, 39 cm from the incisors.                            Biopsied.                           - Normal upper third of esophagus and middle third                            of esophagus. Dilated.                           - A few gastric polyps. Resected and retrieved.                           - Gastritis. Biopsied.                           -  Normal duodenal bulb, first portion of the                            duodenum and second portion of the duodenum.                           - A small amount of food (residue) in the stomach. Recommendation:           - Patient has a contact number available for                            emergencies.  The signs and symptoms of potential                            delayed complications were discussed with the                            patient. Return to normal activities tomorrow.                            Written discharge instructions were provided to the                            patient.                           - Resume previous diet.                           - Continue present medications.                           - Await pathology results.                           - Colonoscopy today.                           - Resume current acid suppression therapy and will                            plan to titrate to lowest effective dose. Gerrit Heck, MD 09/19/2019 10:04:52 AM

## 2019-09-19 NOTE — Op Note (Signed)
[facility] Patient Name: Sean Hill Procedure Date: 09/19/2019 8:50 AM MRN: [id number] Endoscopist: Gerrit Heck , MD Age: 78 Referring MD:  Date of Birth: 1940/12/05 Gender: Male Account #: [id number] Procedure:                Colonoscopy Indications:              This is the patient's first colonoscopy, Positive                            Cologuard test                           78 yo male with a positive ColoGuard in 12/2014,                            presents for initial colonoscopy. He is otherwise                            without lower GI symptoms. Medicines:                Monitored Anesthesia Care Procedure:                Pre-Anesthesia Assessment:                           - Prior to the procedure, a History and Physical                            was performed, and patient medications and                            allergies were reviewed. The patient's tolerance of                            previous anesthesia was also reviewed. The risks                            and benefits of the procedure and the sedation                            options and risks were discussed with the patient.                            All questions were answered, and informed consent                            was obtained. Prior Anticoagulants: The patient has                            taken no previous anticoagulant or antiplatelet                            agents. ASA Grade Assessment: II - A patient with  mild systemic disease. After reviewing the risks                            and benefits, the patient was deemed in                            satisfactory condition to undergo the procedure.                           After obtaining informed consent, the colonoscope                            was passed under direct vision. Throughout the                            procedure, the patient's blood pressure, pulse, and          oxygen saturations were monitored continuously. The                            Colonoscope was introduced through the anus and                            advanced to the the terminal ileum. The colonoscopy                            was performed without difficulty. The patient                            tolerated the procedure well. The quality of the                            bowel preparation was good. The terminal ileum,                            ileocecal valve, appendiceal orifice, and rectum                            were photographed. Scope In: 9:16:45 AM Scope Out: 9:54:57 AM Scope Withdrawal Time: 0 hours 31 minutes 6 seconds  Total Procedure Duration: 0 hours 38 minutes 12 seconds  Findings:                 The perianal and digital rectal examinations were                            normal.                           Ten sessile polyps were found in the descending                            colon (3), transverse colon (5), ascending colon                            (1) and cecum (  1). The polyps were 3 to 8 mm in                            size. These polyps were removed with a cold snare.                            Resection and retrieval were complete. Estimated                            blood loss was minimal.                           An ulcerated non-obstructing mass was found in the                            proximal ascending colon. The mass was                            non-circumferential. The mass measured                            approximately 25 mm in diameter and appeared to                            have an umbilicated center. No bleeding was                            present. This was biopsied with a cold forceps for                            histology. Area 3-4 cm distal to the lesion was                            tattooed on the mesenteric and antimesenteric sides                            with an injection of 2 mL of Spot (carbon black).                             Estimated blood loss was minimal.                           Two sessile polyps were found in the sigmoid colon.                            The polyps were 6 to 11 mm in size. These polyps                            were removed with a cold snare. Resection and                            retrieval were complete. Estimated blood loss was  minimal.                           Multiple small and large-mouthed diverticula were                            found in the sigmoid colon, descending colon and                            transverse colon.                           Non-bleeding internal hemorrhoids were found during                            retroflexion. The hemorrhoids were small.                           The terminal ileum appeared normal. Complications:            No immediate complications. Estimated Blood Loss:     Estimated blood loss was minimal. Impression:               - Ten 3 to 8 mm polyps in the descending colon, in                            the transverse colon, in the ascending colon and in                            the cecum, removed with a cold snare. Resected and                            retrieved.                           - Ulcerated, edematous, erythematous lesion with                            umbilicated center was noted in the proximal                            ascending colon, concerning for high grade                            dysplasia or malignancy. Endoscopic resection not                            attempted in favor of mucosal biopsies. Given                            possibility for surgical resection, tattoo was                            placed 3-4 cm distal to the lesion on the  mesenteric and antimesenteric sides for                            laporoscopic identification. Path sent as rush.                           - Two 6 to 11 mm polyps in the sigmoid colon,                             removed with a cold snare. Resected and retrieved.                           - Diverticulosis in the sigmoid colon, in the                            descending colon and in the transverse colon.                           - Non-bleeding internal hemorrhoids.                           - The examined portion of the ileum was normal. Recommendation:           - Patient has a contact number available for                            emergencies. The signs and symptoms of potential                            delayed complications were discussed with the                            patient. Return to normal activities tomorrow.                            Written discharge instructions were provided to the                            patient.                           - Resume previous diet.                           - Continue present medications.                           - Await pathology results.                           - Return to GI clinic at appointment to be                            scheduled in the next 2-3 weeks.                           -  Pending pathology results, will discuss further                            imaging (CT C/A/P), labs (CEA), and referral to                            Colorectal Surgery Clinic as appropriate. Gerrit Heck, MD 09/19/2019 10:20:25 AM

## 2019-09-19 NOTE — Progress Notes (Signed)
PT taken to PACU. Monitors in place. VSS. Report given to RN. 

## 2019-09-19 NOTE — Progress Notes (Signed)
Called to room to assist during endoscopic procedure.  Patient ID and intended procedure confirmed with present staff. Received instructions for my participation in the procedure from the performing physician.  

## 2019-09-19 NOTE — Progress Notes (Signed)
Temp by Jess, Vitals by DT 

## 2019-09-19 NOTE — Patient Instructions (Signed)
Handouts given:  Hiatal Hernia,  Diverticulosis, Hemorrhoids, polyps,                                 Post dilation diet, Resume previous diet Continue present medications Await pathology results Return to GI clinic in 2-3 weeks( will be scheduled)    YOU HAD AN ENDOSCOPIC PROCEDURE TODAY AT [facility]:   Refer to the procedure report that was given to you for any specific questions about what was found during the examination.  If the procedure report does not answer your questions, please call your gastroenterologist to clarify.  If you requested that your care partner not be given the details of your procedure findings, then the procedure report has been included in a sealed envelope for you to review at your convenience later.  YOU SHOULD EXPECT: Some feelings of bloating in the abdomen. Passage of more gas than usual.  Walking can help get rid of the air that was put into your GI tract during the procedure and reduce the bloating. If you had a lower endoscopy (such as a colonoscopy or flexible sigmoidoscopy) you may notice spotting of blood in your stool or on the toilet paper. If you underwent a bowel prep for your procedure, you may not have a normal bowel movement for a few days.  Please Note:  You might notice some irritation and congestion in your nose or some drainage.  This is from the oxygen used during your procedure.  There is no need for concern and it should clear up in a day or so.  SYMPTOMS TO REPORT IMMEDIATELY:   Following lower endoscopy (colonoscopy or flexible sigmoidoscopy):  Excessive amounts of blood in the stool  Significant tenderness or worsening of abdominal pains  Swelling of the abdomen that is new, acute  Fever of 100F or higher   Following upper endoscopy (EGD)  Vomiting of blood or coffee ground material  New chest pain or pain under the shoulder blades  Painful or persistently difficult swallowing  New shortness of breath  Fever of  100F or higher  Black, tarry-looking stools  For urgent or emergent issues, a gastroenterologist can be reached at any hour by calling [phone].   DIET:  We do recommend a small meal at first, but then you may proceed to your regular diet.  Drink plenty of fluids but you should avoid alcoholic beverages for 24 hours.  ACTIVITY:  You should plan to take it easy for the rest of today and you should NOT DRIVE or use heavy machinery until tomorrow (because of the sedation medicines used during the test).    FOLLOW UP: Our staff will call the number listed on your records 48-72 hours following your procedure to check on you and address any questions or concerns that you may have regarding the information given to you following your procedure. If we do not reach you, we will leave a message.  We will attempt to reach you two times.  During this call, we will ask if you have developed any symptoms of COVID 19. If you develop any symptoms (ie: fever, flu-like symptoms, shortness of breath, cough etc.) before then, please call [phone].  If you test positive for Covid 19 in the 2 weeks post procedure, please call and report this information to [place].    If any biopsies were taken you will be contacted by phone or by letter  within the next 1-3 weeks.  Please call us at [phone] if you have not heard about the biopsies in 3 weeks.    SIGNATURES/CONFIDENTIALITY: You and/or your care partner have signed paperwork which will be entered into your electronic medical record.  These signatures attest to the fact that that the information above on your After Visit Summary has been reviewed and is understood.  Full responsibility of the confidentiality of this discharge information lies with you and/or your care-partner.

## 2019-09-23 ENCOUNTER — Other Ambulatory Visit: Payer: Self-pay

## 2019-09-23 ENCOUNTER — Ambulatory Visit (INDEPENDENT_AMBULATORY_CARE_PROVIDER_SITE_OTHER): Payer: Medicare HMO | Admitting: *Deleted

## 2019-09-23 ENCOUNTER — Telehealth: Payer: Self-pay

## 2019-09-23 DIAGNOSIS — F319 Bipolar disorder, unspecified: Secondary | ICD-10-CM | POA: Diagnosis not present

## 2019-09-23 DIAGNOSIS — F3131 Bipolar disorder, current episode depressed, mild: Secondary | ICD-10-CM

## 2019-09-23 DIAGNOSIS — R69 Illness, unspecified: Secondary | ICD-10-CM | POA: Diagnosis not present

## 2019-09-23 NOTE — Progress Notes (Signed)
(  important)  Sensitive Note    Patient presents today with flat affect and mood for his monthly injection.Patient's Abilify Maintena 400 mg IM injectionwasprepared as ordered and given to patient in hisright upper outer quaderant. Patient tolerated injection without any complaint of pain or discomfort and agreed to return in 4 weeks for next due injection. Patient denied any suicidal or homicidal ideations, no auditory or visual hallucinations and no plan or intent to harm self or others. Patient scheduled next return injection visit for the coming month and will call if any problems prior till then

## 2019-09-23 NOTE — Telephone Encounter (Signed)
Left message for patient to call back to the office-pt needs ROV: 2-3 wks f/u post EGD/colon-dysphagia/heartburn/susp.eso reflux/chronic cough/age appropriate Barrett's eso screening/positive cologuard test on 12/2014/Cirigliano/

## 2019-09-23 NOTE — Telephone Encounter (Signed)
Patient returned call to the office=spoke with [facility]-patient to be scheduled for a ROV;

## 2019-09-24 ENCOUNTER — Telehealth: Payer: Self-pay

## 2019-09-24 ENCOUNTER — Other Ambulatory Visit: Payer: Self-pay | Admitting: Adult Health

## 2019-09-24 ENCOUNTER — Other Ambulatory Visit (INDEPENDENT_AMBULATORY_CARE_PROVIDER_SITE_OTHER): Payer: Medicare HMO

## 2019-09-24 ENCOUNTER — Telehealth: Payer: Self-pay | Admitting: *Deleted

## 2019-09-24 ENCOUNTER — Other Ambulatory Visit: Payer: Self-pay | Admitting: Gastroenterology

## 2019-09-24 DIAGNOSIS — K6389 Other specified diseases of intestine: Secondary | ICD-10-CM

## 2019-09-24 DIAGNOSIS — Z86010 Personal history of colonic polyps: Secondary | ICD-10-CM

## 2019-09-24 LAB — CBC WITH DIFFERENTIAL/PLATELET
Basophils Absolute: 0.1 10*3/uL (ref 0.0–0.1)
Basophils Relative: 1.5 % (ref 0.0–3.0)
Eosinophils Absolute: 0.1 10*3/uL (ref 0.0–0.7)
Eosinophils Relative: 2.4 % (ref 0.0–5.0)
HCT: 43.3 % (ref 39.0–52.0)
Hemoglobin: 14.6 g/dL (ref 13.0–17.0)
Lymphocytes Relative: 36.4 % (ref 12.0–46.0)
Lymphs Abs: 2.1 10*3/uL (ref 0.7–4.0)
MCHC: 33.6 g/dL (ref 30.0–36.0)
MCV: 85.5 fl (ref 78.0–100.0)
Monocytes Absolute: 0.3 10*3/uL (ref 0.1–1.0)
Monocytes Relative: 5.6 % (ref 3.0–12.0)
Neutro Abs: 3.1 10*3/uL (ref 1.4–7.7)
Neutrophils Relative %: 54.1 % (ref 43.0–77.0)
Platelets: 214 10*3/uL (ref 150.0–400.0)
RBC: 5.07 Mil/uL (ref 4.22–5.81)
RDW: 14.1 % (ref 11.5–15.5)
WBC: 5.8 10*3/uL (ref 4.0–10.5)

## 2019-09-24 LAB — PROTIME-INR
INR: 1.3 ratio — ABNORMAL HIGH (ref 0.8–1.0)
Prothrombin Time: 14.6 s — ABNORMAL HIGH (ref 9.6–13.1)

## 2019-09-24 MED ORDER — NYSTATIN 100000 UNIT/GM EX POWD: 1.0000 "application " | Freq: Three times a day (TID) | CUTANEOUS | 3 refills | Status: DC

## 2019-09-24 MED ORDER — CLENPIQ 10-3.5-12 MG-GM -GM/160ML PO SOLN: 1.0000 | Freq: Once | ORAL | 0 refills | Status: AC

## 2019-09-24 NOTE — Telephone Encounter (Signed)
Left message

## 2019-09-24 NOTE — Telephone Encounter (Signed)
Called [phone] and left a message we tried to reach pt for a follow up call. maw

## 2019-09-25 ENCOUNTER — Other Ambulatory Visit: Payer: Self-pay

## 2019-09-25 ENCOUNTER — Ambulatory Visit (INDEPENDENT_AMBULATORY_CARE_PROVIDER_SITE_OTHER): Payer: Medicare HMO | Admitting: Psychiatry

## 2019-09-25 ENCOUNTER — Encounter (HOSPITAL_COMMUNITY): Payer: Self-pay | Admitting: Psychiatry

## 2019-09-25 ENCOUNTER — Encounter (HOSPITAL_COMMUNITY): Payer: Self-pay | Admitting: Gastroenterology

## 2019-09-25 DIAGNOSIS — F411 Generalized anxiety disorder: Secondary | ICD-10-CM

## 2019-09-25 DIAGNOSIS — F319 Bipolar disorder, unspecified: Secondary | ICD-10-CM | POA: Diagnosis not present

## 2019-09-25 DIAGNOSIS — R69 Illness, unspecified: Secondary | ICD-10-CM | POA: Diagnosis not present

## 2019-09-25 LAB — BUN/CREATININE RATIO
BUN: 15 mg/dL (ref 7–25)
Creat: 1 mg/dL (ref 0.70–1.18)
GFR, Est African American: 84 mL/min/{1.73_m2} (ref >=60)
GFR, Est Non African American: 73 mL/min/{1.73_m2} (ref >=60)

## 2019-09-25 MED ORDER — TRAZODONE HCL 50 MG PO TABS: 50.0000 mg | ORAL_TABLET | Freq: Every day | ORAL | 0 refills | Status: DC

## 2019-09-25 MED ORDER — BENZTROPINE MESYLATE 1 MG PO TABS: 1.0000 mg | ORAL_TABLET | Freq: Every day | ORAL | 0 refills | Status: DC

## 2019-09-25 MED ORDER — CITALOPRAM HYDROBROMIDE 20 MG PO TABS: 20.0000 mg | ORAL_TABLET | Freq: Every day | ORAL | 0 refills | Status: DC

## 2019-09-25 MED ORDER — ARIPIPRAZOLE ER 400 MG IM PRSY: 400.0000 mg | PREFILLED_SYRINGE | INTRAMUSCULAR | 2 refills | Status: DC

## 2019-09-25 MED ORDER — TEMAZEPAM 30 MG PO CAPS: 30.0000 mg | ORAL_CAPSULE | Freq: Every evening | ORAL | 0 refills | Status: DC | PRN

## 2019-09-25 NOTE — Progress Notes (Signed)
Virtual Visit via Telephone Note  I connected with Sean Hill on 09/25/19 at 11:00 AM EST by telephone and verified that I am speaking with the correct person using two identifiers.   I discussed the limitations, risks, security and privacy concerns of performing an evaluation and management service by telephone and the availability of in person appointments. I also discussed with the patient that there may be a patient responsible charge related to this service. The patient expressed understanding and agreed to proceed.   History of Present Illness: Patient was evaluated by phone session.  He is doing very well on his current medication.  He had a good Christmas but it was quite as he was not able to see a lot of family members.  He is sleeping good.  He is getting Abilify injection once a month which is keeping his mania under control.  He denies any highs or lows, irritability, anger or any severe mood swings.  Since we had cut down the trazodone 6 months ago he is not having constipation and dizziness.  Occasionally he has dry mouth.  His tremors are also subsided but sometimes he does tremors which is chronic in nature but does not interfere in his daily activities.  He remains very cautious going outside due to pandemic.  He has upcoming procedure for colonoscopy and he had blood work.  His CBC, BUN/creatinine is normal.  He like to continue his current medication.    Past Psychiatric History:Reviewed. H/Omultiple hospitalization. Last hospitalization in July 2014.H/Ooverdose onLatudawith alcohol.H/Ocutting wrist. Took Tegretol, Depakote, Ambien, Remeron, Vistaril, Geodon, Abilify, lithium, Provigil, Zoloft, Neurontin, Lamictal, Wellbutrin, Risperdal and Pristiq. H/Omania,aggression, getting speeding tickets, excessive buying and impulsive behavior.  Recent Results (from the past 2160 hour(s))  SARS Coronavirus 2 (TAT 6-24 hrs)     Status: None   Collection Time: 09/16/19  12:00 AM  Result Value Ref Range   SARS Coronavirus 2 RESULT:  NEGATIVE     Comment: RESULT:  NEGATIVESARS-CoV-2 INTERPRETATION:A NEGATIVE  test result means that SARS-CoV-2 RNA was not present in the specimen above the limit of detection of this test. This does not preclude a possible SARS-CoV-2 infection and should not be used as the  sole basis for patient management decisions. Negative results must be combined with clinical observations, patient history, and epidemiological information. Optimum specimen types and timing for peak viral levels during infections caused by SARS-CoV-2  have not been determined. Collection of multiple specimens or types of specimens may be necessary to detect virus. Improper specimen collection and handling, sequence variability under primers/probes, or organism present below the limit of detection may  lead to false negative results. Positive and negative predictive values of testing are highly dependent on prevalence. False negative test results are more likely when prevalence of disease is high.The expected result is NEGATIVE.Fact  Sheet for  Healthcare Providers: https://www.woods-mathews.com/.Fact Sheet for Patients: SugarRoll.be.Normal Reference Range - Negative   BUN/Creatinine Ratio     Status: None   Collection Time: 09/24/19 12:21 PM  Result Value Ref Range   BUN 15 7 - 25 mg/dL   Creat 1.00 0.70 - 1.18 mg/dL    Comment: For patients >28 years of age, the reference limit for Creatinine is approximately 13% higher for people identified as African-American. .    GFR, Est Non African American 73 > OR = 60 mL/min/1.57m2   GFR, Est African American 84 > OR = 60 mL/min/1.24m2   BUN/Creatinine Ratio NOT APPLICABLE 6 - 22 (calc)  INR/PT     Status: Abnormal   Collection Time: 09/24/19 12:21 PM  Result Value Ref Range   INR 1.3 (H) 0.8 - 1.0 ratio   Prothrombin Time 14.6 (H) 9.6 - 13.1 sec  CBC w/Diff     Status: None    Collection Time: 09/24/19 12:21 PM  Result Value Ref Range   WBC 5.8 4.0 - 10.5 K/uL   RBC 5.07 4.22 - 5.81 Mil/uL   Hemoglobin 14.6 13.0 - 17.0 g/dL   HCT 43.3 39.0 - 52.0 %   MCV 85.5 78.0 - 100.0 fl   MCHC 33.6 30.0 - 36.0 g/dL   RDW 14.1 11.5 - 15.5 %   Platelets 214.0 150.0 - 400.0 K/uL   Neutrophils Relative % 54.1 43.0 - 77.0 %   Lymphocytes Relative 36.4 12.0 - 46.0 %   Monocytes Relative 5.6 3.0 - 12.0 %   Eosinophils Relative 2.4 0.0 - 5.0 %   Basophils Relative 1.5 0.0 - 3.0 %   Neutro Abs 3.1 1.4 - 7.7 K/uL   Lymphs Abs 2.1 0.7 - 4.0 K/uL   Monocytes Absolute 0.3 0.1 - 1.0 K/uL   Eosinophils Absolute 0.1 0.0 - 0.7 K/uL   Basophils Absolute 0.1 0.0 - 0.1 K/uL     Psychiatric Specialty Exam: Physical Exam  Review of Systems  There were no vitals taken for this visit.There is no height or weight on file to calculate BMI.  General Appearance: NA  Eye Contact:  NA  Speech:  Clear and Coherent and Normal Rate  Volume:  Normal  Mood:  Euthymic  Affect:  NA  Thought Process:  Goal Directed  Orientation:  Full (Time, Place, and Person)  Thought Content:  WDL  Suicidal Thoughts:  No  Homicidal Thoughts:  No  Memory:  Immediate;   Good Recent;   Good Remote;   Good  Judgement:  Good  Insight:  Good  Psychomotor Activity:  NA  Concentration:  Concentration: Fair and Attention Span: Fair  Recall:  Good  Fund of Knowledge:  Good  Language:  Good  Akathisia:  No  Handed:  Right  AIMS (if indicated):     Assets:  Communication Skills Desire for Improvement Housing Resilience Social Support  ADL's:  Intact  Cognition:  WNL  Sleep:   ok      Assessment and Plan: Bipolar disorder type I.  Generalized anxiety disorder.  Patient is a stable on his current medication.  His anxiety and mania is under control.  Continue trazodone 50 mg at bedtime, Abilify injection 400 mg intramuscular every 4 weeks, Celexa 20, temazepam 30 mg at bedtime and Cogentin 1 mg at  bedtime.  He is in therapy with [facility] Lighter and I encouraged to continue that.  Recommended to call us back if is any question of any concern.  Follow-up in 3 months.  Follow Up Instructions:    I discussed the assessment and treatment plan with the patient. The patient was provided an opportunity to ask questions and all were answered. The patient agreed with the plan and demonstrated an understanding of the instructions.   The patient was advised to call back or seek an in-person evaluation if the symptoms worsen or if the condition fails to improve as anticipated.  I provided 20 minutes of non-face-to-face time during this encounter.   [name], MD

## 2019-09-30 ENCOUNTER — Other Ambulatory Visit (HOSPITAL_COMMUNITY)
Admission: RE | Admit: 2019-09-30 | Discharge: 2019-09-30 | Disposition: A | Discharge status: Home / Self Care | Payer: Medicare HMO | Source: Ambulatory Visit | Attending: Gastroenterology | Admitting: Gastroenterology

## 2019-09-30 ENCOUNTER — Other Ambulatory Visit: Payer: Self-pay

## 2019-09-30 ENCOUNTER — Ambulatory Visit (HOSPITAL_COMMUNITY)
Admission: RE | Admit: 2019-09-30 | Discharge: 2019-09-30 | Disposition: A | Discharge status: Home / Self Care | Payer: Medicare HMO | Source: Ambulatory Visit | Attending: Gastroenterology | Admitting: Gastroenterology

## 2019-09-30 ENCOUNTER — Encounter (HOSPITAL_COMMUNITY): Payer: Self-pay

## 2019-09-30 DIAGNOSIS — R590 Localized enlarged lymph nodes: Secondary | ICD-10-CM | POA: Diagnosis not present

## 2019-09-30 DIAGNOSIS — K6389 Other specified diseases of intestine: Secondary | ICD-10-CM

## 2019-09-30 DIAGNOSIS — Z86010 Personal history of colonic polyps: Secondary | ICD-10-CM | POA: Diagnosis not present

## 2019-09-30 DIAGNOSIS — K8020 Calculus of gallbladder without cholecystitis without obstruction: Secondary | ICD-10-CM | POA: Insufficient documentation

## 2019-09-30 DIAGNOSIS — I700 Atherosclerosis of aorta: Secondary | ICD-10-CM | POA: Insufficient documentation

## 2019-09-30 DIAGNOSIS — Z20822 Contact with and (suspected) exposure to covid-19: Secondary | ICD-10-CM | POA: Diagnosis not present

## 2019-09-30 DIAGNOSIS — Z01818 Encounter for other preprocedural examination: Secondary | ICD-10-CM | POA: Insufficient documentation

## 2019-09-30 DIAGNOSIS — K639 Disease of intestine, unspecified: Secondary | ICD-10-CM | POA: Diagnosis not present

## 2019-09-30 DIAGNOSIS — D122 Benign neoplasm of ascending colon: Secondary | ICD-10-CM | POA: Diagnosis not present

## 2019-09-30 LAB — SARS CORONAVIRUS 2 (TAT 6-24 HRS): SARS Coronavirus 2: NEGATIVE

## 2019-09-30 MED ORDER — SODIUM CHLORIDE (PF) 0.9 % IJ SOLN
INTRAMUSCULAR | Status: AC
  Filled 2019-09-30: qty 50

## 2019-09-30 MED ORDER — IOHEXOL 300 MG/ML  SOLN
100.0000 mL | Freq: Once | INTRAMUSCULAR | Status: AC | PRN
  Administered 2019-09-30: 100 mL via INTRAVENOUS

## 2019-10-01 NOTE — Progress Notes (Signed)
Spoke with patient, has quarantined without symptoms.  Has started prep for tomorrows procedure.  Answered questions, patient arrival time 0815 am.

## 2019-10-02 ENCOUNTER — Encounter (HOSPITAL_COMMUNITY)
Admission: RE | Disposition: A | Discharge status: Home / Self Care | Payer: Self-pay | Source: Home / Self Care | Attending: Gastroenterology

## 2019-10-02 ENCOUNTER — Ambulatory Visit (HOSPITAL_COMMUNITY): Payer: Medicare HMO | Admitting: Anesthesiology

## 2019-10-02 ENCOUNTER — Ambulatory Visit (HOSPITAL_COMMUNITY)
Admission: RE | Admit: 2019-10-02 | Discharge: 2019-10-02 | Disposition: A | Discharge status: Home / Self Care | Payer: Medicare HMO | Attending: Gastroenterology | Admitting: Gastroenterology

## 2019-10-02 ENCOUNTER — Encounter (HOSPITAL_COMMUNITY): Payer: Self-pay | Admitting: Gastroenterology

## 2019-10-02 ENCOUNTER — Other Ambulatory Visit: Payer: Self-pay

## 2019-10-02 DIAGNOSIS — D122 Benign neoplasm of ascending colon: Secondary | ICD-10-CM | POA: Diagnosis not present

## 2019-10-02 DIAGNOSIS — K219 Gastro-esophageal reflux disease without esophagitis: Secondary | ICD-10-CM | POA: Insufficient documentation

## 2019-10-02 DIAGNOSIS — R05 Cough: Secondary | ICD-10-CM | POA: Insufficient documentation

## 2019-10-02 DIAGNOSIS — K6389 Other specified diseases of intestine: Secondary | ICD-10-CM

## 2019-10-02 DIAGNOSIS — Z86010 Personal history of colonic polyps: Secondary | ICD-10-CM | POA: Diagnosis not present

## 2019-10-02 DIAGNOSIS — D126 Benign neoplasm of colon, unspecified: Secondary | ICD-10-CM

## 2019-10-02 DIAGNOSIS — K640 First degree hemorrhoids: Secondary | ICD-10-CM

## 2019-10-02 DIAGNOSIS — K648 Other hemorrhoids: Secondary | ICD-10-CM | POA: Diagnosis not present

## 2019-10-02 DIAGNOSIS — I10 Essential (primary) hypertension: Secondary | ICD-10-CM | POA: Diagnosis not present

## 2019-10-02 DIAGNOSIS — Z8614 Personal history of Methicillin resistant Staphylococcus aureus infection: Secondary | ICD-10-CM | POA: Diagnosis not present

## 2019-10-02 DIAGNOSIS — K635 Polyp of colon: Secondary | ICD-10-CM | POA: Diagnosis not present

## 2019-10-02 DIAGNOSIS — K5730 Diverticulosis of large intestine without perforation or abscess without bleeding: Secondary | ICD-10-CM | POA: Diagnosis not present

## 2019-10-02 DIAGNOSIS — K449 Diaphragmatic hernia without obstruction or gangrene: Secondary | ICD-10-CM | POA: Insufficient documentation

## 2019-10-02 HISTORY — PX: ENDOSCOPIC MUCOSAL RESECTION: SHX6839

## 2019-10-02 HISTORY — PX: SUBMUCOSAL LIFTING INJECTION: SHX6855

## 2019-10-02 HISTORY — DX: Presence of spectacles and contact lenses: Z97.3

## 2019-10-02 HISTORY — PX: COLONOSCOPY WITH PROPOFOL: SHX5780

## 2019-10-02 SURGERY — COLONOSCOPY WITH PROPOFOL
Anesthesia: Monitor Anesthesia Care

## 2019-10-02 MED ORDER — PROPOFOL 500 MG/50ML IV EMUL
INTRAVENOUS | Status: DC | PRN
  Administered 2019-10-02: 150 ug/kg/min via INTRAVENOUS

## 2019-10-02 MED ORDER — PROPOFOL 10 MG/ML IV BOLUS
INTRAVENOUS | Status: DC | PRN
  Administered 2019-10-02: 20 mg via INTRAVENOUS
  Administered 2019-10-02: 40 mg via INTRAVENOUS

## 2019-10-02 MED ORDER — LIDOCAINE 2% (20 MG/ML) 5 ML SYRINGE
INTRAMUSCULAR | Status: DC | PRN
  Administered 2019-10-02: 100 mg via INTRAVENOUS

## 2019-10-02 MED ORDER — LACTATED RINGERS IV SOLN
INTRAVENOUS | Status: DC
  Administered 2019-10-02: 1000 mL via INTRAVENOUS

## 2019-10-02 MED ORDER — SODIUM CHLORIDE 0.9 % IV SOLN: INTRAVENOUS | Status: DC

## 2019-10-02 SURGICAL SUPPLY — 22 items

## 2019-10-02 NOTE — Interval H&P Note (Signed)
History and Physical Interval Note:  10/02/2019 9:00 AM  Sean Hill  has presented today for surgery, with the diagnosis of LArge right-sided polyp.  The various methods of treatment have been discussed with the patient and family. After consideration of risks, benefits and other options for treatment, the patient has consented to  Procedure(s): COLONOSCOPY WITH PROPOFOL (N/A) ENDOSCOPIC MUCOSAL RESECTION (N/A) as a surgical intervention.  The patient's history has been reviewed, patient examined, no change in status, stable for surgery.  I have reviewed the patient's chart and labs.  Questions were answered to the patient's satisfaction.     Dominic Pea [name]

## 2019-10-02 NOTE — Anesthesia Procedure Notes (Signed)
Date/Time: 10/02/2019 9:23 AM Performed by: Talbot Grumbling, CRNA Oxygen Delivery Method: Simple face mask

## 2019-10-02 NOTE — Op Note (Signed)
[facility] Patient Name: Sean Hill Procedure Date: 10/02/2019 MRN: [id number] Attending MD: Gerrit Heck , MD Date of Birth: [birth year] CSN: [id number] Age: 79 Admit Type: Inpatient Procedure:                Colonoscopy Indications:              For therapy of adenomatous polyps in the colon                           79 yo male with index colonoscopy in 08/2019                            notable for 12 tubular adenomas (3-11 mm) along                            with a 25-30 mm tubulovillous adenoma in the                            ascending colon. He presents today for Endoscopic                            Mucosal Resection. Providers:                Gerrit Heck, MD, Cleda Daub, RN, Elspeth Cho Tech., Technician, Corie Chiquito,                            Technician, Marla Roe, CRNA Referring MD:              Medicines:                Monitored Anesthesia Care Complications:            No immediate complications. Estimated Blood Loss:     Estimated blood loss was minimal. Procedure:                Pre-Anesthesia Assessment:                           - Prior to the procedure, a History and Physical                            was performed, and patient medications and                            allergies were reviewed. The patient's tolerance of                            previous anesthesia was also reviewed. The risks                            and benefits of the procedure and the sedation                            options  and risks were discussed with the patient.                            All questions were answered, and informed consent                            was obtained. Prior Anticoagulants: The patient has                            taken no previous anticoagulant or antiplatelet                            agents. ASA Grade Assessment: III - A patient with                            severe systemic  disease. After reviewing the risks                            and benefits, the patient was deemed in                            satisfactory condition to undergo the procedure.                           After obtaining informed consent, the colonoscope                            was passed under direct vision. Throughout the                            procedure, the patient's blood pressure, pulse, and                            oxygen saturations were monitored continuously. The                            [id number] [id number] Olympus colonoscope was                            introduced through the anus and advanced to the the                            cecum, identified by appendiceal orifice and                            ileocecal valve. The colonoscopy was performed                            without difficulty. The patient tolerated the                            procedure well. The quality of the bowel  preparation was adequate. The ileocecal valve,                            appendiceal orifice, and rectum were photographed. Scope In: 9:31:42 AM Scope Out: 10:20:14 AM Scope Withdrawal Time: 0 hours 44 minutes 46 seconds  Total Procedure Duration: 0 hours 48 minutes 32 seconds  Findings:      The perianal and digital rectal examinations were normal.      A 25 mm polyp was found in the proximal ascending colon. The polyp was       sessile. The polyp was removed with a saline injection-lift technique       using 5 cc Orise with appropriate lift, followed by hot snare resection       in piecemeal fashion. Resection and retrieval were complete. Estimated       blood loss was minimal. There was no bleeding at the conclusion of the       study and the polypectomy base was clean and intact. Given positioning       and post polypectomy appearance, hemostatic clip placement not applied.      A tattoo was seen in the ascending colon, located 3-4 cm distal to the        large polyp.      Multiple small and large-mouthed diverticula were found in the sigmoid       colon, descending colon and distal transverse colon.      Non-bleeding internal hemorrhoids were found during retroflexion. The       hemorrhoids were small. Impression:               - One 25 mm polyp in the proximal ascending colon,                            removed using injection-lift and a hot snare.                            Resected and retrieved.                           - A tattoo was seen in the ascending colon.                           - Diverticulosis in the sigmoid colon, in the                            descending colon and in the distal transverse colon.                           - Non-bleeding internal hemorrhoids. Moderate Sedation:      Not Applicable - Patient had care per Anesthesia. Recommendation:           - Patient has a contact number available for                            emergencies. The signs and symptoms of potential                            delayed complications were discussed  with the                            patient. Return to normal activities tomorrow.                            Written discharge instructions were provided to the                            patient.                           - Resume previous diet.                           - Continue present medications.                           - Await pathology results.                           - Per guidelines, recommend repeat colonoscopy in 6                            months for surveillance after piecemeal polypectomy                            of a polyp >20 mm in size.                           - Return to GI clinic PRN.                           - Use fiber, for example Citrucel, Fibercon, Konsyl                            or Metamucil. Procedure Code(s):        --- Professional ---                           [phone], Colonoscopy, flexible; with removal of                             tumor(s), polyp(s), or other lesion(s) by snare                            technique                           45381, Colonoscopy, flexible; with directed                            submucosal injection(s), any substance Diagnosis Code(s):        --- Professional ---                           K63.5, Polyp of colon  K64.8, Other hemorrhoids                           D12.6, Benign neoplasm of colon, unspecified                           K57.30, Diverticulosis of large intestine without                            perforation or abscess without bleeding CPT copyright 2019 American Medical Association. All rights reserved. The codes documented in this report are preliminary and upon coder review may  be revised to meet current compliance requirements. Gerrit Heck, MD 10/02/2019 10:33:47 AM Number of Addenda: 0

## 2019-10-02 NOTE — H&P (Signed)
P  Chief Complaint:    Large ascending colon tubulovillous adenoma  GI History: 79 year old male with a history of pneumonia, depression, obesity (BMI 38.9), bipolar disorder, presenting for colonoscopy with EMR. 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  The larger polyp was biopsied, without any high-grade dysplasia or malignancy.  Discussed resection options with the patient at length, to include surgical resection with right hemicolectomy versus colonoscopy with Endoscopic mucosal resection (EMR), and he would like to proceed with EMR.  Subsequent CT abdomen/pelvis otherwise unremarkable, without any lymphadenopathy.  Mildly enlarged peripancreatic/portacaval nodes (1.4 cm), unchanged in 2013 and likely reactive.  Separately, longstanding history of GERD. Index sxs of hoarseness, chronic cough, regurgitation, increased belching. No HB. Worse with supine, spicy, tomato based sauces, fried foods. Sxs worse over the last 3-4 months or so. Was previously treated with omeprazole, but more recently changed to Protonix.  Sleeps with head elevated.  Chronic cough and dysphagia which occurs immediately after swallowing.  EGD with dilation last month.  Has been evaluated by Dr. Valeta Harms in the [facility]monary [facility] for chronic cough in 1/thousand 20, with improvement after 2-week trial of Protonix 40 mg bid.   Endoscopic history: -Colonoscopy (08/2019, Dr. 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 Bryan Lemma): 3 cm HH, irregular Z-line (biopsy: Reflux changes without intestinal metaplasia), Gastric hyperplastic polyps.  Empiric dilation with 51 Fr Savary  He presents today for colonoscopy with EMR.  Since last appointment, labs notable for  normal CBC, mildly elevated INR at 1.3.    Review of systems:     No chest pain, no SOB, no fevers, no urinary sx   Past Medical History:  Diagnosis Date  . Anal fissure   . Anxiety   . Arthritis    right wrist; pt. states "everywhere"  . Asthma   . B[place] disorder ([place])   . Cataract   . Depression   . GERD (gastroesophageal reflux disease)   . History of kidney stones   . 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 Hyperlipidemia   . Hypertension    states under control with med., has been on med. x 1 yr.  . Impaired hearing   . Left bundle branch block (LBBB) on electrocardiogram 10/29/2015  . Macular degeneration (senile) of retina    left  . Wears glasses     Patient's surgical history, family medical history, social history, medications and allergies were all reviewed in Epic    No current facility-administered medications for this encounter.    Physical Exam:     Ht 5\' 9"  (1.753 m)   Wt 117.9 kg   BMI 38.40 kg/m   GENERAL:  Pleasant male in NAD PSYCH: : Cooperative, normal affect EENT:  conjunctiva pink, mucous membranes moist, neck supple without masses CARDIAC:  RRR, no murmur heard, no peripheral edema PULM: Normal respiratory effort, lungs CTA bilaterally, no wheezing ABDOMEN:  Nondistended, soft, nontender. No obvious masses, no hepatomegaly,  normal bowel sounds SKIN:  turgor, no lesions seen Musculoskeletal:  Normal muscle tone, normal strength NEURO: Alert and oriented x 3, no focal neurologic deficits   IMPRESSION and PLAN:    1) Large tubulovillous adenoma: -Colonoscopy today with EMR -Additional recommendations regarding further intervention or ongoing surveillance  intervals pending endoscopic findings today -Discussed increased risks of bleeding, perforation, incomplete resection with these large lesions.  We will initially lift lesion, and as long as list successful (i.e. no central tethering), will proceed with EMR.  If  left unsuccessful, recommend surgical intervention instead.  He understands, and wishes to proceed with colonoscopy with EMR today.  2) History of tubular adenomas: -10+ tubular adenomas also resected at time of recent colonoscopy.  Again, ongoing surveillance intervals pending today's procedure, but certainly would recommend repeat within 1 year per current guidelines  3) GERD: 4) Chronic cough 5) Hiatal hernia -Resume current PPI therapy -We will review medication efficacy along with titration to lowest effective dose at follow-up appointment         Lavena Bullion ,DO, [place] 10/02/2019, 8:27 AM

## 2019-10-02 NOTE — Anesthesia Preprocedure Evaluation (Addendum)
Anesthesia Evaluation  Patient identified by MRN, date of birth, ID band Patient awake    Reviewed: Allergy & Precautions, NPO status , Patient's Chart, lab work & pertinent test results  Airway Mallampati: II  TM Distance: >3 FB Neck ROM: Full    Dental no notable dental hx.    Pulmonary neg pulmonary ROS,    Pulmonary exam normal breath sounds clear to auscultation       Cardiovascular hypertension, Pt. on medications Normal cardiovascular exam Rhythm:Regular Rate:Normal  LBBB   Neuro/Psych negative neurological ROS  negative psych ROS   GI/Hepatic negative GI ROS, Neg liver ROS,   Endo/Other  negative endocrine ROS  Renal/GU negative Renal ROS  negative genitourinary   Musculoskeletal negative musculoskeletal ROS (+)   Abdominal   Peds negative pediatric ROS (+)  Hematology negative hematology ROS (+)   Anesthesia Other Findings   Reproductive/Obstetrics negative OB ROS                            Anesthesia Physical Anesthesia Plan  ASA: III  Anesthesia Plan: MAC   Post-op Pain Management:    Induction: Intravenous  PONV Risk Score and Plan: 0  Airway Management Planned: Simple Face Mask  Additional Equipment:   Intra-op Plan:   Post-operative Plan:   Informed Consent: I have reviewed the patients History and Physical, chart, labs and discussed the procedure including the risks, benefits and alternatives for the proposed anesthesia with the patient or authorized representative who has indicated his/her understanding and acceptance.     Dental advisory given  Plan Discussed with: CRNA and Surgeon  Anesthesia Plan Comments:         Anesthesia Quick Evaluation

## 2019-10-02 NOTE — Discharge Instructions (Signed)

## 2019-10-02 NOTE — Transfer of Care (Signed)
Immediate Anesthesia Transfer of Care Note  Patient: Sean Hill  Procedure(s) Performed: COLONOSCOPY WITH PROPOFOL (N/A ) ENDOSCOPIC MUCOSAL RESECTION (N/A ) SUBMUCOSAL LIFTING INJECTION  Patient Location: PACU  Anesthesia Type:MAC  Level of Consciousness: awake, alert  and oriented  Airway & Oxygen Therapy: Patient Spontanous Breathing  Post-op Assessment: Report given to RN and Post -op Vital signs reviewed and stable  Post vital signs: Reviewed and stable  Last Vitals:  Vitals Value Taken Time  BP    Temp    Pulse    Resp    SpO2      Last Pain:  Vitals:   10/02/19 0854  TempSrc: Oral  PainSc: 0-No pain         Complications: No apparent anesthesia complications

## 2019-10-03 ENCOUNTER — Encounter: Payer: Self-pay | Admitting: *Deleted

## 2019-10-03 LAB — SURGICAL PATHOLOGY

## 2019-10-04 NOTE — Anesthesia Postprocedure Evaluation (Signed)
Anesthesia Post Note  Patient: Sean Hill  Procedure(s) Performed: COLONOSCOPY WITH PROPOFOL (N/A ) ENDOSCOPIC MUCOSAL RESECTION (N/A ) SUBMUCOSAL LIFTING INJECTION     Patient location during evaluation: PACU Anesthesia Type: MAC Level of consciousness: awake and alert Pain management: pain level controlled Vital Signs Assessment: post-procedure vital signs reviewed and stable Respiratory status: spontaneous breathing, nonlabored ventilation, respiratory function stable and patient connected to nasal cannula oxygen Cardiovascular status: stable and blood pressure returned to baseline Postop Assessment: no apparent nausea or vomiting Anesthetic complications: no    Last Vitals:  Vitals:   10/02/19 1030 10/02/19 1040  BP: (!) 114/56 128/66  Pulse: 63 60  Resp: 20 19  Temp:    SpO2: 96% 96%    Last Pain:  Vitals:   10/03/19 1219  TempSrc:   PainSc: 0-No pain                 Juliza Machnik S

## 2019-10-08 ENCOUNTER — Encounter (HOSPITAL_COMMUNITY): Payer: Self-pay | Admitting: Family Medicine

## 2019-10-08 ENCOUNTER — Other Ambulatory Visit: Payer: Self-pay

## 2019-10-08 ENCOUNTER — Inpatient Hospital Stay (HOSPITAL_COMMUNITY)
Admission: EM | Admit: 2019-10-08 | Discharge: 2019-10-10 | DRG: 378 | Disposition: A | Discharge status: Home / Self Care | Payer: Medicare HMO | Attending: Internal Medicine | Admitting: Internal Medicine

## 2019-10-08 DIAGNOSIS — Z96651 Presence of right artificial knee joint: Secondary | ICD-10-CM | POA: Diagnosis present

## 2019-10-08 DIAGNOSIS — E785 Hyperlipidemia, unspecified: Secondary | ICD-10-CM | POA: Diagnosis present

## 2019-10-08 DIAGNOSIS — Z800 Family history of malignant neoplasm of digestive organs: Secondary | ICD-10-CM

## 2019-10-08 DIAGNOSIS — R69 Illness, unspecified: Secondary | ICD-10-CM | POA: Diagnosis not present

## 2019-10-08 DIAGNOSIS — I10 Essential (primary) hypertension: Secondary | ICD-10-CM | POA: Diagnosis not present

## 2019-10-08 DIAGNOSIS — F319 Bipolar disorder, unspecified: Secondary | ICD-10-CM | POA: Diagnosis present

## 2019-10-08 DIAGNOSIS — F419 Anxiety disorder, unspecified: Secondary | ICD-10-CM | POA: Diagnosis not present

## 2019-10-08 DIAGNOSIS — Z811 Family history of alcohol abuse and dependence: Secondary | ICD-10-CM | POA: Diagnosis not present

## 2019-10-08 DIAGNOSIS — H3530 Unspecified macular degeneration: Secondary | ICD-10-CM | POA: Diagnosis present

## 2019-10-08 DIAGNOSIS — D122 Benign neoplasm of ascending colon: Secondary | ICD-10-CM | POA: Diagnosis present

## 2019-10-08 DIAGNOSIS — Z20822 Contact with and (suspected) exposure to covid-19: Secondary | ICD-10-CM | POA: Diagnosis not present

## 2019-10-08 DIAGNOSIS — Y838 Other surgical procedures as the cause of abnormal reaction of the patient, or of later complication, without mention of misadventure at the time of the procedure: Secondary | ICD-10-CM | POA: Diagnosis not present

## 2019-10-08 DIAGNOSIS — M1990 Unspecified osteoarthritis, unspecified site: Secondary | ICD-10-CM | POA: Diagnosis present

## 2019-10-08 DIAGNOSIS — Z03818 Encounter for observation for suspected exposure to other biological agents ruled out: Secondary | ICD-10-CM | POA: Diagnosis not present

## 2019-10-08 DIAGNOSIS — K635 Polyp of colon: Secondary | ICD-10-CM | POA: Diagnosis present

## 2019-10-08 DIAGNOSIS — Z8614 Personal history of Methicillin resistant Staphylococcus aureus infection: Secondary | ICD-10-CM

## 2019-10-08 DIAGNOSIS — K921 Melena: Secondary | ICD-10-CM | POA: Diagnosis not present

## 2019-10-08 DIAGNOSIS — H9190 Unspecified hearing loss, unspecified ear: Secondary | ICD-10-CM | POA: Diagnosis present

## 2019-10-08 DIAGNOSIS — K633 Ulcer of intestine: Secondary | ICD-10-CM | POA: Diagnosis present

## 2019-10-08 DIAGNOSIS — K625 Hemorrhage of anus and rectum: Secondary | ICD-10-CM

## 2019-10-08 DIAGNOSIS — Z885 Allergy status to narcotic agent status: Secondary | ICD-10-CM

## 2019-10-08 DIAGNOSIS — K91840 Postprocedural hemorrhage and hematoma of a digestive system organ or structure following a digestive system procedure: Secondary | ICD-10-CM | POA: Diagnosis not present

## 2019-10-08 DIAGNOSIS — Z8041 Family history of malignant neoplasm of ovary: Secondary | ICD-10-CM

## 2019-10-08 DIAGNOSIS — Z880 Allergy status to penicillin: Secondary | ICD-10-CM

## 2019-10-08 DIAGNOSIS — Z8349 Family history of other endocrine, nutritional and metabolic diseases: Secondary | ICD-10-CM

## 2019-10-08 DIAGNOSIS — I447 Left bundle-branch block, unspecified: Secondary | ICD-10-CM | POA: Diagnosis present

## 2019-10-08 DIAGNOSIS — K5730 Diverticulosis of large intestine without perforation or abscess without bleeding: Secondary | ICD-10-CM | POA: Diagnosis present

## 2019-10-08 DIAGNOSIS — K602 Anal fissure, unspecified: Secondary | ICD-10-CM | POA: Diagnosis present

## 2019-10-08 DIAGNOSIS — Z818 Family history of other mental and behavioral disorders: Secondary | ICD-10-CM

## 2019-10-08 DIAGNOSIS — K219 Gastro-esophageal reflux disease without esophagitis: Secondary | ICD-10-CM | POA: Diagnosis present

## 2019-10-08 DIAGNOSIS — H269 Unspecified cataract: Secondary | ICD-10-CM | POA: Diagnosis present

## 2019-10-08 DIAGNOSIS — Z6837 Body mass index (BMI) 37.0-37.9, adult: Secondary | ICD-10-CM

## 2019-10-08 DIAGNOSIS — Z7982 Long term (current) use of aspirin: Secondary | ICD-10-CM

## 2019-10-08 DIAGNOSIS — J45909 Unspecified asthma, uncomplicated: Secondary | ICD-10-CM | POA: Diagnosis present

## 2019-10-08 DIAGNOSIS — Z79899 Other long term (current) drug therapy: Secondary | ICD-10-CM

## 2019-10-08 HISTORY — DX: Postprocedural hemorrhage of a digestive system organ or structure following a digestive system procedure: K91.840

## 2019-10-08 LAB — COMPREHENSIVE METABOLIC PANEL
ALT: 22 U/L (ref 0–44)
AST: 30 U/L (ref 15–41)
Albumin: 4 g/dL (ref 3.5–5.0)
Alkaline Phosphatase: 62 U/L (ref 38–126)
Anion gap: 9 (ref 5–15)
BUN: 17 mg/dL (ref 8–23)
CO2: 23 mmol/L (ref 22–32)
Calcium: 9.1 mg/dL (ref 8.9–10.3)
Chloride: 109 mmol/L (ref 98–111)
Creatinine, Ser: 1.19 mg/dL (ref 0.61–1.24)
GFR calc Af Amer: >60 mL/min (ref >60)
GFR calc non Af Amer: 59 mL/min — ABNORMAL LOW (ref >60)
Glucose, Bld: 123 mg/dL — ABNORMAL HIGH (ref 70–99)
Potassium: 3.7 mmol/L (ref 3.5–5.1)
Sodium: 141 mmol/L (ref 135–145)
Total Bilirubin: 0.5 mg/dL (ref 0.3–1.2)
Total Protein: 6.4 g/dL — ABNORMAL LOW (ref 6.5–8.1)

## 2019-10-08 LAB — TYPE AND SCREEN
ABO/RH(D): A POS
Antibody Screen: NEGATIVE

## 2019-10-08 LAB — CBC
HCT: 40.7 % (ref 39.0–52.0)
Hemoglobin: 13.4 g/dL (ref 13.0–17.0)
MCH: 28.5 pg (ref 26.0–34.0)
MCHC: 32.9 g/dL (ref 30.0–36.0)
MCV: 86.4 fL (ref 80.0–100.0)
Platelets: 261 10*3/uL (ref 150–400)
RBC: 4.71 MIL/uL (ref 4.22–5.81)
RDW: 14.2 % (ref 11.5–15.5)
WBC: 7.7 10*3/uL (ref 4.0–10.5)
nRBC: 0 % (ref 0.0–0.2)

## 2019-10-08 LAB — POC OCCULT BLOOD, ED: Fecal Occult Bld: POSITIVE — AB

## 2019-10-08 MED ORDER — SODIUM CHLORIDE 0.9 % IV BOLUS
500.0000 mL | Freq: Once | INTRAVENOUS | Status: AC
  Administered 2019-10-08: 500 mL via INTRAVENOUS

## 2019-10-08 MED ORDER — PEG 3350-KCL-NA BICARB-NACL 420 G PO SOLR
4000.0000 mL | Freq: Once | ORAL | Status: AC
  Administered 2019-10-09: 03:00:00 4000 mL via ORAL
  Filled 2019-10-08: qty 4000

## 2019-10-08 MED ORDER — SODIUM CHLORIDE 0.9 % IV SOLN: INTRAVENOUS | Status: DC

## 2019-10-08 NOTE — H&P (Signed)
History and Physical   Sean Hill [id number] DOB: [birth year] DOA: 10/08/2019  Referring MD/NP/PA: Dr. Zenia Resides  PCP: [name], NP   Outpatient Specialists: Dr. Lyndel Safe, GI  Patient coming from: Home  Chief Complaint: Rectal bleed  HPI: Sean Hill is a 79 y.o. male with medical history significant of recurrent polyposis who has had multiple polyps removed most recently a large right-sided polyp removed 6 days ago and noted slight bleeding almost daily since that.  This has continued to get worse and today he has a large dark bloody stool.  He has had 3 episodes so far.  Associated with some clots.  He came in with mild dizziness.  Has had cramping.  Patient is hemodynamically stable here.  H&H also remained stable.  He is being admitted with GI bleed secondary to rectal bleed.  GI has been consulted and patient will have evaluation in the morning.  No nausea vomiting.  No hematemesis.  ED Course: Temperature 98.3, blood pressure 149/85, pulse 94 respirate 24 and oxygen sats 92% room air.  CBC and chemistry at this point is stable with hemoglobin 13.4.  Test is negative.  Patient is typed and crossmatched, GI consulted and he is being admitted to the hospital for management and possible repeat sigmoidoscopy or colonoscopy  Review of Systems: As per HPI otherwise 10 point review of systems negative.    Past Medical History:  Diagnosis Date  . Anal fissure   . Anxiety   . Arthritis    right wrist; pt. states "everywhere"  . Asthma   . Bipolar disorder ([place])   . Cataract   . Depression   . GERD (gastroesophageal reflux disease)   . History of kidney stones   . 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 Hyperlipidemia   . Hypertension    states under control with med., has been on med. x 1 yr.  . Impaired hearing   . Left bundle branch block (LBBB) on electrocardiogram 10/29/2015  . Macular degeneration (senile) of retina    left  . Wears  glasses     Past Surgical History:  Procedure Laterality Date  . ANKLE FUSION Bilateral   . ANKLE SURGERY Bilateral    ligament surgery  . CARDIAC CATHETERIZATION  x 2   1983; 11/14/2003  . CARPOMETACARPEL SUSPENSION PLASTY Right 09/22/2016   Procedure: right thumb carpometacarpal  ARTHROPLASTY;  Surgeon: Leanora Cover, MD;  Location: [place];  Service: Orthopedics;  Laterality: Right;  right thumb carpometacarpal  ARTHROPLASTY  . CARPOMETACARPEL SUSPENSION PLASTY Right 11/02/2017   Procedure: RIGHT THUMB SUSPENSIONPLASTY WITH TIGHTROPE, DISTAL POLE SCAPHOID  EXCISION;  Surgeon: Leanora Cover, MD;  Location: [place];  Service: Orthopedics;  Laterality: Right;  . COLONOSCOPY WITH PROPOFOL N/A 10/02/2019   Procedure: COLONOSCOPY WITH PROPOFOL;  Surgeon: Lavena Bullion, DO;  Location: [facility];  Service: Gastroenterology;  Laterality: N/A;  . ELBOW SURGERY Left   . ENDOSCOPIC MUCOSAL RESECTION N/A 10/02/2019   Procedure: ENDOSCOPIC MUCOSAL RESECTION;  Surgeon: Lavena Bullion, DO;  Location: [facility];  Service: Gastroenterology;  Laterality: N/A;  . INGUINAL HERNIA REPAIR Right   . OPEN REDUCTION INTERNAL FIXATION (ORIF) DISTAL RADIAL FRACTURE Right 10/29/2015   Procedure: OPEN REDUCTION INTERNAL FIXATION (ORIF) DISTAL RADIAL FRACTURE;  Surgeon: Leanora Cover, MD;  Location: [place];  Service: Orthopedics;  Laterality: Right;  . ORIF ELBOW FRACTURE Right   . SUBMUCOSAL LIFTING INJECTION  10/02/2019   Procedure: SUBMUCOSAL LIFTING INJECTION;  Surgeon: Lavena Bullion, DO;  Location: [facility];  Service: Gastroenterology;;  . TONSILLECTOMY    . TOTAL KNEE ARTHROPLASTY Bilateral   . ULNAR COLLATERAL LIGAMENT REPAIR Right 12/29/2015   Procedure: REPAIR  RIGHT LATERAL ULNAR COLLATERAL LIGAMENT TEAR  EXTENSOR ORIGIN ;  Surgeon: Leanora Cover, MD;  Location: [place];  Service: Orthopedics;  Laterality: Right;  . WRIST  ARTHROSCOPY WITH DEBRIDEMENT Right 07/14/2016   Procedure: RIGHT WRIST ARTHROSCOPY WITH DEBRIDEMENT  TRIANGULAR FIBROCARTILAGE COMPLEX;  Surgeon: Leanora Cover, MD;  Location: [place];  Service: Orthopedics;  Laterality: Right;     reports that he has never smoked. He has never used smokeless tobacco. He reports previous alcohol use. He reports that he does not use drugs.  Allergies  Allergen Reactions  . Penicillins Other (See Comments)    BLISTERS Did it involve swelling of the face/tongue/throat, SOB, or low BP? No Did it involve sudden or severe rash/hives, skin peeling, or any reaction on the inside of your mouth or nose? No Did you need to seek medical attention at a hospital or doctor's office? No When did it last happen?around 1980 If all above answers are "NO", may proceed with cephalosporin use.   . Codeine Rash    Family History  Problem Relation Age of Onset  . Alcohol abuse Mother   . Ovarian cancer Mother   . Alcohol abuse Father   . Pancreatic cancer Father   . Depression Daughter   . Paranoid behavior Daughter   . Hyperlipidemia Brother   . Barrett's esophagus Brother   . Colon cancer Maternal Grandmother   . Esophageal cancer Neg Hx   . Stomach cancer Neg Hx   . Rectal cancer Neg Hx      Prior to Admission medications   Medication Sig Start Date End Date Taking? Authorizing Provider  Alum Hydroxide-Mag Carbonate (GAVISCON PO) Take 1 tablet by mouth 3 (three) times daily as needed (heartburn).    Yes [provider]  ARIPiprazole ER (ABILIFY MAINTENA) 400 MG PRSY prefilled syringe Inject 400 mg into the muscle every 28 (twenty-eight) days. 09/25/19 09/24/20 Yes Arfeen, Arlyce Harman, MD  aspirin 81 MG tablet Take 81 mg by mouth daily.   Yes [provider]  atorvastatin (LIPITOR) 40 MG tablet TAKE 1 TABLET BY MOUTH EVERY DAY Patient taking differently: Take 40 mg by mouth daily at 6 PM.  05/07/19  Yes Nafziger, Tommi Rumps, NP  benztropine  (COGENTIN) 1 MG tablet Take 1 tablet (1 mg total) by mouth daily. 09/25/19  Yes Arfeen, Arlyce Harman, MD  citalopram (CELEXA) 20 MG tablet Take 1 tablet (20 mg total) by mouth daily. 09/25/19 09/24/20 Yes Arfeen, Arlyce Harman, MD  fenofibrate (TRICOR) 145 MG tablet Take 1 tablet (145 mg total) by mouth daily. 11/29/18  Yes Nafziger, Tommi Rumps, NP  lisinopril (PRINIVIL,ZESTRIL) 20 MG tablet Take 20 mg by mouth daily.   Yes [provider]  Multiple Vitamin (MULTIVITAMIN WITH MINERALS) TABS tablet Take 1 tablet by mouth daily.   Yes [provider]  Multiple Vitamins-Minerals (PRESERVISION AREDS 2 PO) Take 1 capsule by mouth 2 (two) times daily.   Yes [provider]  Omega-3 Fatty Acids (FISH OIL PO) Take 1 capsule by mouth daily.    Yes [provider]  oxymetazoline (AFRIN) 0.05 % nasal spray Place 1 spray into both nostrils 2 (two) times daily as needed for congestion.   Yes [provider]  pantoprazole (PROTONIX) 40 MG tablet Take 1 tablet (40 mg total) by mouth 2 (two) times daily. 08/26/19  Yes Cirigliano, Vito V, DO  temazepam (RESTORIL) 30 MG capsule Take 1 capsule (30 mg total) by mouth at bedtime as needed. for sleep Patient taking differently: Take 30 mg by mouth at bedtime.  09/25/19  Yes Arfeen, Arlyce Harman, MD  traZODone (DESYREL) 50 MG tablet Take 1 tablet (50 mg total) by mouth at bedtime. 09/25/19  Yes Arfeen, Arlyce Harman, MD  Vitamin D, Cholecalciferol, 1000 UNITS TABS Take 1,000 Units by mouth daily.    Yes [provider]  albuterol (VENTOLIN HFA) 108 (90 Base) MCG/ACT inhaler Inhale 2 puffs into the lungs every 6 (six) hours as needed for wheezing or shortness of breath. Patient not taking: Reported on 09/26/2019 06/06/19   [name], NP  nystatin (MYCOSTATIN/NYSTOP) powder Apply 1 application topically 3 (three) times daily. Patient not taking: Reported on 09/26/2019 09/24/19   [name], NP    Physical Exam: Vitals:   10/08/19 2018 10/08/19 2130 10/08/19  2239 10/08/19 2339  BP:  (!) 149/85 116/66 126/68  Pulse:  86 73 71  Resp:  18 (!) 24 17  Temp:      TempSrc:      SpO2:  98% 97% 92%  Weight: 115.7 kg     Height: 5\' 9"  (1.753 m)         Constitutional: NAD, calm, comfortable, morbidly obese Vitals:   10/08/19 2018 10/08/19 2130 10/08/19 2239 10/08/19 2339  BP:  (!) 149/85 116/66 126/68  Pulse:  86 73 71  Resp:  18 (!) 24 17  Temp:      TempSrc:      SpO2:  98% 97% 92%  Weight: 115.7 kg     Height: 5\' 9"  (1.753 m)      Eyes: PERRL, lids and conjunctivae normal ENMT: Mucous membranes are moist. Posterior pharynx clear of any exudate or lesions.Normal dentition.  Neck: normal, supple, no masses, no thyromegaly Respiratory: clear to auscultation bilaterally, no wheezing, no crackles. Normal respiratory effort. No accessory muscle use.  Cardiovascular: Regular rate and rhythm, no murmurs / rubs / gallops. No extremity edema. 2+ pedal pulses. No carotid bruits.  Abdomen: no tenderness, no masses palpated. No hepatosplenomegaly. Bowel sounds positive.  Musculoskeletal: no clubbing / cyanosis. No joint deformity upper and lower extremities. Good ROM, no contractures. Normal muscle tone.  Skin: no rashes, lesions, ulcers. No induration Neurologic: CN 2-12 grossly intact. Sensation intact, DTR normal. Strength 5/5 in all 4.  Psychiatric: Normal judgment and insight. Alert and oriented x 3. Normal mood.     Labs on Admission: I have personally reviewed following labs and imaging studies  CBC: Recent Labs  Lab 10/08/19 2038  WBC 7.7  HGB 13.4  HCT 40.7  MCV 86.4  PLT 0000000   Basic Metabolic Panel: Recent Labs  Lab 10/08/19 2038  NA 141  K 3.7  CL 109  CO2 23  GLUCOSE 123*  BUN 17  CREATININE 1.19  CALCIUM 9.1   GFR: Estimated Creatinine Clearance: 66.3 mL/min (by C-G formula based on SCr of 1.19 mg/dL). Liver Function Tests: Recent Labs  Lab 10/08/19 2038  AST 30  ALT 22  ALKPHOS 62  BILITOT 0.5  PROT 6.4*   ALBUMIN 4.0   No results for input(s): LIPASE, AMYLASE in the last 168 hours. No results for input(s): AMMONIA in the last 168 hours. Coagulation Profile: No results for input(s): INR,  PROTIME in the last 168 hours. Cardiac Enzymes: No results for input(s): CKTOTAL, CKMB, CKMBINDEX, TROPONINI in the last 168 hours. BNP (last 3 results) No results for input(s): PROBNP in the last 8760 hours. HbA1C: No results for input(s): HGBA1C in the last 72 hours. CBG: No results for input(s): GLUCAP in the last 168 hours. Lipid Profile: No results for input(s): CHOL, HDL, LDLCALC, TRIG, CHOLHDL, LDLDIRECT in the last 72 hours. Thyroid Function Tests: No results for input(s): TSH, T4TOTAL, FREET4, T3FREE, THYROIDAB in the last 72 hours. Anemia Panel: No results for input(s): VITAMINB12, FOLATE, FERRITIN, TIBC, IRON, RETICCTPCT in the last 72 hours. Urine analysis:    Component Value Date/Time   COLORURINE YELLOW 04/23/2013 [place] 04/23/2013 1821   LABSPEC 1.015 04/23/2013 1821   PHURINE 5.5 04/23/2013 1821   GLUCOSEU NEGATIVE 04/23/2013 1821   HGBUR NEGATIVE 04/23/2013 1821   BILIRUBINUR neg 08/18/2016 0942   KETONESUR NEGATIVE 04/23/2013 1821   PROTEINUR neg 08/18/2016 0942   PROTEINUR NEGATIVE 04/23/2013 1821   UROBILINOGEN 0.2 08/18/2016 0942   UROBILINOGEN 0.2 04/23/2013 1821   NITRITE neg 08/18/2016 0942   NITRITE NEGATIVE 04/23/2013 1821   LEUKOCYTESUR Negative 08/18/2016 0942   Sepsis Labs: @LABRCNTIP (procalcitonin:4,lacticidven:4) ) Recent Results (from the past 240 hour(s))  SARS CORONAVIRUS 2 (TAT 6-24 HRS) Nasopharyngeal Nasopharyngeal Swab     Status: None   Collection Time: 09/30/19  9:26 AM   Specimen: Nasopharyngeal Swab  Result Value Ref Range Status   SARS Coronavirus 2 NEGATIVE NEGATIVE Final    Comment: (NOTE) SARS-CoV-2 target nucleic acids are NOT DETECTED. The SARS-CoV-2 RNA is generally detectable in upper and lower respiratory  specimens during the acute phase of infection. Negative results do not preclude SARS-CoV-2 infection, do not rule out co-infections with other pathogens, and should not be used as the sole basis for treatment or other patient management decisions. Negative results must be combined with clinical observations, patient history, and epidemiological information. The expected result is Negative. Fact Sheet for Patients: SugarRoll.be Fact Sheet for Healthcare Providers: https://www.woods-mathews.com/ This test is not yet approved or cleared by the [place] FDA and  has been authorized for detection and/or diagnosis of SARS-CoV-2 by FDA under an Emergency Use Authorization (EUA). This EUA will remain  in effect (meaning this test can be used) for the duration of the COVID-19 declaration under Section 56 4(b)(1) of the Act, 21 U.S.C. section 360bbb-3(b)(1), unless the authorization is terminated or revoked sooner. Performed at [facility], [place] [address] [place], [place] [address]      Radiological Exams on Admission: No results found.  EKG: Independently reviewed.  Normal sinus rhythm with left bundle branch block  Assessment/Plan Principal Problem:   Rectal bleed Active Problems:   Bipolar disorder ([facility])   Gastroesophageal reflux disease   Hyperlipidemia   Essential hypertension   Adenomatous polyp of ascending colon     #1 rectal bleed: Probably bleeding from site of polyp removal.  Could also be secondary to diverticular disease and possible hemorrhoidal bleed but less likely.  Could also be even upper GI bleed but less likely.  Patient will be admitted.  IV Protonix.  Keep n.p.o. after midnight.  GI consulted for work-up.  #2 bipolar disorder: Appears to be stable.  Will hold home medications  #3 GERD: Continue with PPIs  #4 essential hypertension: Continue to hold monitor blood pressure.  #5 hyperlipidemia: Continue to  hold statin until after procedure.   DVT prophylaxis: SCD Code  Status: Full code Family Communication: No family at bedside Disposition Plan: Home Consults called: GI Dr. Arelia Longest Admission status: Observation  Severity of Illness: The appropriate patient status for this patient is OBSERVATION. Observation status is judged to be reasonable and necessary in order to provide the required intensity of service to ensure the patient's safety. The patient's presenting symptoms, physical exam findings, and initial radiographic and laboratory data in the context of their medical condition is felt to place them at decreased risk for further clinical deterioration. Furthermore, it is anticipated that the patient will be medically stable for discharge from the hospital within 2 midnights of admission. The following factors support the patient status of observation.   " The patient's presenting symptoms include rectal bleed. " The physical exam findings include morbidly obese. " The initial radiographic and laboratory data are still positive for occult blood.     Barbette Merino MD [facility] Pager [phone]  If 7PM-7AM, please contact night-coverage [email or web address] Password [email or web address]  10/08/2019, 11:40 PM

## 2019-10-08 NOTE — ED Provider Notes (Signed)
[facility] DEPT Provider Note   CSN: [id number] Arrival date & time: 10/08/19  1928     History Chief Complaint  Patient presents with  . Rectal Bleeding    Sean Hill is a 79 y.o. male with past medical history significant for anal fissures, anxiety, hypertension who presents for evaluation of rectal bleeding.  Patient had colonoscopy with our GI on 10/02/2019.  There was a large right-sided polyp.  Patient states at 6 PM today he noted to have initially dark or bloody stools.  He had 3 stools at home.  Patient states he has had 3 episodes of pure liquid diarrhea which he describes as dark red with bright red clots while here waiting in the emergency department over the last 2 hours.  Denies anticoagulation.  No fever, chills, nausea, vomiting, chest pain, shortness of breath, hematuria.  States he does have some lower abdominal cramping however this is nonfocal in nature.  Rates current pain a 2/10 described as crampy.  Denies additional aggravating or alleviating factors. He did have one episode of lightheadedness when he went from sitting to standing earlier today.  Denies any current dizziness or lightheadedness.  History obtained from patient and past medical records.  No interpreter is used.  Patient has been in contact with Dr. Lyndel Safe with our GI who recommended he come to the emergency department for evaluation.  HPI     Past Medical History:  Diagnosis Date  . Anal fissure   . Anxiety   . Arthritis    right wrist; pt. states "everywhere"  . Asthma   . Bipolar disorder ([place])   . Cataract   . Depression   . GERD (gastroesophageal reflux disease)   . History of kidney stones   . 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 Hyperlipidemia   . Hypertension    states under control with med., has been on med. x 1 yr.  . Impaired hearing   . Left bundle branch block (LBBB) on electrocardiogram 10/29/2015  . Macular  degeneration (senile) of retina    left  . Wears glasses     Patient Active Problem List   Diagnosis Date Noted  . Adenomatous polyp of ascending colon   . Tubulovillous adenoma of colon   . History of colonic polyps   . Diverticulosis of colon without hemorrhage   . Internal hemorrhoid   . Essential hypertension 08/23/2016  . Left bundle branch block (LBBB) on electrocardiogram 06/10/2016  . Hyperlipidemia 06/07/2016  . Left leg swelling 01/06/2015  . Change in bowel habits 01/06/2015  . Gastroesophageal reflux disease 11/28/2011  . Bipolar disorder ([place]) 11/25/2011    Past Surgical History:  Procedure Laterality Date  . ANKLE FUSION Bilateral   . ANKLE SURGERY Bilateral    ligament surgery  . CARDIAC CATHETERIZATION  x 2   1983; 11/14/2003  . CARPOMETACARPEL SUSPENSION PLASTY Right 09/22/2016   Procedure: right thumb carpometacarpal  ARTHROPLASTY;  Surgeon: Leanora Cover, MD;  Location: [facility];  Service: Orthopedics;  Laterality: Right;  right thumb carpometacarpal  ARTHROPLASTY  . CARPOMETACARPEL SUSPENSION PLASTY Right 11/02/2017   Procedure: RIGHT THUMB SUSPENSIONPLASTY WITH TIGHTROPE, DISTAL POLE SCAPHOID  EXCISION;  Surgeon: Leanora Cover, MD;  Location: [facility];  Service: Orthopedics;  Laterality: Right;  . COLONOSCOPY WITH PROPOFOL N/A 10/02/2019   Procedure: COLONOSCOPY WITH PROPOFOL;  Surgeon: Lavena Bullion, DO;  Location: [facility];  Service: Gastroenterology;  Laterality: N/A;  . ELBOW SURGERY Left   . ENDOSCOPIC MUCOSAL RESECTION N/A 10/02/2019   Procedure: ENDOSCOPIC MUCOSAL RESECTION;  Surgeon: Lavena Bullion, DO;  Location: [facility];  Service: Gastroenterology;  Laterality: N/A;  . INGUINAL HERNIA REPAIR Right   . OPEN REDUCTION INTERNAL FIXATION (ORIF) DISTAL RADIAL FRACTURE Right 10/29/2015   Procedure: OPEN REDUCTION INTERNAL FIXATION (ORIF) DISTAL RADIAL FRACTURE;  Surgeon: Leanora Cover, MD;  Location: [facility];  Service: Orthopedics;  Laterality: Right;  . ORIF ELBOW FRACTURE Right   . SUBMUCOSAL LIFTING INJECTION  10/02/2019   Procedure: SUBMUCOSAL LIFTING INJECTION;  Surgeon: Lavena Bullion, DO;  Location: [facility];  Service: Gastroenterology;;  . TONSILLECTOMY    . TOTAL KNEE ARTHROPLASTY Bilateral   . ULNAR COLLATERAL LIGAMENT REPAIR Right 12/29/2015   Procedure: REPAIR  RIGHT LATERAL ULNAR COLLATERAL LIGAMENT TEAR  EXTENSOR ORIGIN ;  Surgeon: Leanora Cover, MD;  Location: [facility];  Service: Orthopedics;  Laterality: Right;  . WRIST ARTHROSCOPY WITH DEBRIDEMENT Right 07/14/2016   Procedure: RIGHT WRIST ARTHROSCOPY WITH DEBRIDEMENT  TRIANGULAR FIBROCARTILAGE COMPLEX;  Surgeon: Leanora Cover, MD;  Location: [facility];  Service: Orthopedics;  Laterality: Right;       Family History  Problem Relation Age of Onset  . Alcohol abuse Mother   . Ovarian cancer Mother   . Alcohol abuse Father   . Pancreatic cancer Father   . Depression Daughter   . Paranoid behavior Daughter   . Hyperlipidemia Brother   . Barrett's esophagus Brother   . Colon cancer Maternal Grandmother   . Esophageal cancer Neg Hx   . Stomach cancer Neg Hx   . Rectal cancer Neg Hx     Social History   Tobacco Use  . Smoking status: Never Smoker  . Smokeless tobacco: Never Used  Substance Use Topics  . Alcohol use: Not Currently  . Drug use: No    Home Medications Prior to Admission medications   Medication Sig Start Date End Date Taking? Authorizing Provider  Alum Hydroxide-Mag Carbonate (GAVISCON PO) Take 1 tablet by mouth 3 (three) times daily as needed (heartburn).    Yes [provider]  ARIPiprazole ER (ABILIFY MAINTENA) 400 MG PRSY prefilled syringe Inject 400 mg into the muscle every 28 (twenty-eight) days. 09/25/19 09/24/20 Yes Arfeen, Arlyce Harman, MD  aspirin 81 MG tablet Take 81 mg by mouth daily.   Yes [provider]  atorvastatin (LIPITOR) 40  MG tablet TAKE 1 TABLET BY MOUTH EVERY DAY Patient taking differently: Take 40 mg by mouth daily at 6 PM.  05/07/19  Yes Nafziger, Tommi Rumps, NP  benztropine (COGENTIN) 1 MG tablet Take 1 tablet (1 mg total) by mouth daily. 09/25/19  Yes Arfeen, Arlyce Harman, MD  citalopram (CELEXA) 20 MG tablet Take 1 tablet (20 mg total) by mouth daily. 09/25/19 09/24/20 Yes Arfeen, Arlyce Harman, MD  fenofibrate (TRICOR) 145 MG tablet Take 1 tablet (145 mg total) by mouth daily. 11/29/18  Yes Nafziger, Tommi Rumps, NP  lisinopril (PRINIVIL,ZESTRIL) 20 MG tablet Take 20 mg by mouth daily.   Yes [provider]  Multiple Vitamin (MULTIVITAMIN WITH MINERALS) TABS tablet Take 1 tablet by mouth daily.   Yes [provider]  Multiple Vitamins-Minerals (PRESERVISION AREDS 2 PO) Take 1 capsule by mouth 2 (two) times daily.   Yes [provider]  Omega-3 Fatty Acids (FISH OIL PO) Take 1 capsule by mouth daily.    Yes [provider]  oxymetazoline (  AFRIN) 0.05 % nasal spray Place 1 spray into both nostrils 2 (two) times daily as needed for congestion.   Yes [provider]  pantoprazole (PROTONIX) 40 MG tablet Take 1 tablet (40 mg total) by mouth 2 (two) times daily. 08/26/19  Yes Cirigliano, Vito V, DO  temazepam (RESTORIL) 30 MG capsule Take 1 capsule (30 mg total) by mouth at bedtime as needed. for sleep Patient taking differently: Take 30 mg by mouth at bedtime.  09/25/19  Yes Arfeen, Arlyce Harman, MD  traZODone (DESYREL) 50 MG tablet Take 1 tablet (50 mg total) by mouth at bedtime. 09/25/19  Yes Arfeen, Arlyce Harman, MD  Vitamin D, Cholecalciferol, 1000 UNITS TABS Take 1,000 Units by mouth daily.    Yes [provider]  albuterol (VENTOLIN HFA) 108 (90 Base) MCG/ACT inhaler Inhale 2 puffs into the lungs every 6 (six) hours as needed for wheezing or shortness of breath. Patient not taking: Reported on 09/26/2019 06/06/19   [name], NP  nystatin (MYCOSTATIN/NYSTOP) powder Apply 1 application topically 3 (three)  times daily. Patient not taking: Reported on 09/26/2019 09/24/19   [name], NP    Allergies    Penicillins and Codeine  Review of Systems   Review of Systems  Constitutional: Negative.   HENT: Negative.   Eyes: Negative.   Respiratory: Negative.   Cardiovascular: Negative.   Gastrointestinal: Positive for abdominal pain (Cramping abd pain) and blood in stool. Negative for abdominal distention, anal bleeding, constipation, diarrhea, nausea, rectal pain and vomiting.  Genitourinary: Negative.   Musculoskeletal: Negative.   Skin: Negative.   All other systems reviewed and are negative.   Physical Exam Updated Vital Signs BP 116/66   Pulse 73   Temp 98.3 F (36.8 C) (Oral)   Resp (!) 24   Ht 5\' 9"  (1.753 m)   Wt 115.7 kg   SpO2 97%   BMI 37.66 kg/m   Physical Exam Vitals and nursing note reviewed. Exam conducted with a chaperone present.  Constitutional:      General: He is not in acute distress.    Appearance: He is well-developed. He is not ill-appearing, toxic-appearing or diaphoretic.  HENT:     Head: Normocephalic and atraumatic.     Nose: Nose normal.     Mouth/Throat:     Mouth: Mucous membranes are moist.     Pharynx: Oropharynx is clear.  Eyes:     Extraocular Movements: Extraocular movements intact.     Pupils: Pupils are equal, round, and reactive to light.     Comments: EOMs intact.  No nystagmus.  Cardiovascular:     Rate and Rhythm: Normal rate and regular rhythm.     Pulses: Normal pulses.     Heart sounds: Normal heart sounds.  Pulmonary:     Effort: Pulmonary effort is normal. No respiratory distress.     Breath sounds: Normal breath sounds.     Comments: Speaks in full sentences without difficulty. Abdominal:     General: Bowel sounds are normal. There is no distension.     Palpations: Abdomen is soft.     Comments: Soft without rebound or guarding.  Some mild generalized tenderness to his lower abdomen.  No focal pain.  Genitourinary:     Comments: Male tech in room during exam.  Patient with gross bright red blood per rectum.  No fluctuance or induration.  No tenderness palpation.  No obvious hemorrhoids or anal fissures. Musculoskeletal:        General: Normal  range of motion.     Cervical back: Normal range of motion and neck supple.     Comments: Moves all 4 extremities without difficulty.  Skin:    General: Skin is warm and dry.     Capillary Refill: Capillary refill takes less than 2 seconds.     Comments: Brisk capillary refill.  Neurological:     Mental Status: He is alert.     Comments: Cranial 2 through 12 grossly intact.  Negative finger-nose, heel-to-shin, Romberg.  Intact sensation to bilateral upper and lower extremities without difficulty.  No facial droop.  Ambulatory without difficulty in ED.    ED Results / Procedures / Treatments   Labs (all labs ordered are listed, but only abnormal results are displayed) Labs Reviewed  COMPREHENSIVE METABOLIC PANEL - Abnormal; Notable for the following components:      Result Value   Glucose, Bld 123 (*)    Total Protein 6.4 (*)    GFR calc non Af Amer 59 (*)    All other components within normal limits  POC OCCULT BLOOD, ED - Abnormal; Notable for the following components:   Fecal Occult Bld POSITIVE (*)    All other components within normal limits  SARS CORONAVIRUS 2 (TAT 6-24 HRS)  CBC  TYPE AND SCREEN  ABO/RH    EKG EKG Interpretation  Date/Time:  Tuesday October 08 2019 21:46:22 EST Ventricular Rate:  76 PR Interval:    QRS Duration: 156 QT Interval:  458 QTC Calculation: 515 R Axis:   19 Text Interpretation: Sinus rhythm Left bundle branch block No STEMI. Similar to prior. Confirmed by Nanda Quinton [phone]) on 10/08/2019 10:03:26 PM   Radiology No results found.  Procedures Procedures (including critical care time)  Medications Ordered in ED Medications  sodium chloride 0.9 % bolus 500 mL (has no administration in time range)  0.9 %   sodium chloride infusion (has no administration in time range)  polyethylene glycol-electrolytes (NuLYTELY) solution 4,000 mL (has no administration in time range)    ED Course  I have reviewed the triage vital signs and the nursing notes.  Pertinent labs & imaging results that were available during my care of the patient were reviewed by me and considered in my medical decision making (see chart for details).  79 year old male appears otherwise well presents for evaluation of rectal bleeding.  Colonoscopy 6 days ago with Dr. Bryan Lemma with lower GI.  They removed polyp at that time.  Patient with greater than 5 episodes of large-volume melena is right is is bright red blood per rectum here in the ED.  Patient with gross blood on GU exam.  Abdomen soft.  Some mild generalized tenderness to his lower quadrants however no focal tenderness.  Did have one episode of some lightheadedness when he went from sitting to standing.  We will plan for orthostatics, light fluid bolus.  He is not anticoagulated.  We will plan to consult GI.  Patient likely needs additional scope to assess for complications from colonoscopy.  Nonsurgical abdomen on exam.  Clinical Course as of Oct 07 2309  Tue Oct 08, 2019  2230 CONSULT with Dr. Lyndel Safe with GI. Will plan clear liquids tonight. Likly Colonoscopy tomorrow. Request hospitalist to admit   [BH]    Clinical Course User Index [BH] Quinto Tippy A, PA-C    Patient hemodynamically stable.  Patient to be admitted to the hospitalist for likely scope in the AM. Abd Soft, nonsurgical.  No focal pain.  I  have low suspicion for acute bacterial infectious process, perforated bowel or obstruction as cause of symptoms.  Dizziness likely due to low volume status.  Have low suspicion for acute intracranial, cardiac or pulmonary vascular etiology of symptoms.  CONSULT with Dr. Jonelle Sidle with [facility] who will evaluate patient for admission.   Patient seen evaluated by attending  physician who agrees with the treatment, plan and disposition. MDM Rules/Calculators/A&P                       Final Clinical Impression(s) / ED Diagnoses Final diagnoses:  Rectal bleeding    Rx / DC Orders ED Discharge Orders    None       Male Minish A, PA-C 10/08/19 2311    Lacretia Leigh, MD 10/09/19 1009

## 2019-10-08 NOTE — ED Provider Notes (Signed)
Medical screening examination/treatment/procedure(s) were conducted as a shared visit with non-physician practitioner(s) and myself.  I personally evaluated the patient during the encounter.    79 year old male presents with rectal bleeding. Had a polyp removed recently. He is hemodynamically stable at this time. Continues to have bloody stools. Will consult GI and admit   Lacretia Leigh, MD 10/08/19 2154

## 2019-10-08 NOTE — ED Triage Notes (Signed)
Patient had a colonoscopy on 10/02/2019 for a large right-sided polyp. At 18:00, he has started to have coffee-ground stools with dark red blood. Also, states he started to feel dizzy.

## 2019-10-09 ENCOUNTER — Ambulatory Visit: Payer: Medicare HMO | Admitting: Gastroenterology

## 2019-10-09 ENCOUNTER — Inpatient Hospital Stay (HOSPITAL_COMMUNITY): Payer: Medicare HMO | Admitting: Certified Registered"

## 2019-10-09 ENCOUNTER — Encounter (HOSPITAL_COMMUNITY): Payer: Self-pay

## 2019-10-09 ENCOUNTER — Encounter (HOSPITAL_COMMUNITY)
Admission: EM | Disposition: A | Discharge status: Home / Self Care | Payer: Self-pay | Source: Home / Self Care | Attending: Internal Medicine

## 2019-10-09 ENCOUNTER — Encounter (HOSPITAL_COMMUNITY): Payer: Self-pay | Admitting: Internal Medicine

## 2019-10-09 ENCOUNTER — Ambulatory Visit (HOSPITAL_COMMUNITY): Admit: 2019-10-09 | Payer: Medicare HMO | Admitting: Internal Medicine

## 2019-10-09 HISTORY — PX: COLONOSCOPY WITH PROPOFOL: SHX5780

## 2019-10-09 HISTORY — PX: HEMOSTASIS CLIP PLACEMENT: SHX6857

## 2019-10-09 LAB — CBC
HCT: 35.4 % — ABNORMAL LOW (ref 39.0–52.0)
HCT: 36.5 % — ABNORMAL LOW (ref 39.0–52.0)
HCT: 37.3 % — ABNORMAL LOW (ref 39.0–52.0)
Hemoglobin: 11.9 g/dL — ABNORMAL LOW (ref 13.0–17.0)
Hemoglobin: 12.1 g/dL — ABNORMAL LOW (ref 13.0–17.0)
Hemoglobin: 12.3 g/dL — ABNORMAL LOW (ref 13.0–17.0)
MCH: 28.3 pg (ref 26.0–34.0)
MCH: 28.4 pg (ref 26.0–34.0)
MCH: 29 pg (ref 26.0–34.0)
MCHC: 33 g/dL (ref 30.0–36.0)
MCHC: 33.2 g/dL (ref 30.0–36.0)
MCHC: 33.6 g/dL (ref 30.0–36.0)
MCV: 85.7 fL (ref 80.0–100.0)
MCV: 85.7 fL (ref 80.0–100.0)
MCV: 86.1 fL (ref 80.0–100.0)
Platelets: 222 10*3/uL (ref 150–400)
Platelets: 233 10*3/uL (ref 150–400)
Platelets: 239 10*3/uL (ref 150–400)
RBC: 4.11 MIL/uL — ABNORMAL LOW (ref 4.22–5.81)
RBC: 4.26 MIL/uL (ref 4.22–5.81)
RBC: 4.35 MIL/uL (ref 4.22–5.81)
RDW: 14.1 % (ref 11.5–15.5)
RDW: 14.4 % (ref 11.5–15.5)
RDW: 14.5 % (ref 11.5–15.5)
WBC: 6.8 10*3/uL (ref 4.0–10.5)
WBC: 8.5 10*3/uL (ref 4.0–10.5)
WBC: 8.9 10*3/uL (ref 4.0–10.5)
nRBC: 0 % (ref 0.0–0.2)
nRBC: 0 % (ref 0.0–0.2)
nRBC: 0 % (ref 0.0–0.2)

## 2019-10-09 LAB — COMPREHENSIVE METABOLIC PANEL
ALT: 21 U/L (ref 0–44)
AST: 29 U/L (ref 15–41)
Albumin: 3.9 g/dL (ref 3.5–5.0)
Alkaline Phosphatase: 59 U/L (ref 38–126)
Anion gap: 9 (ref 5–15)
BUN: 17 mg/dL (ref 8–23)
CO2: 22 mmol/L (ref 22–32)
Calcium: 9 mg/dL (ref 8.9–10.3)
Chloride: 109 mmol/L (ref 98–111)
Creatinine, Ser: 1.1 mg/dL (ref 0.61–1.24)
GFR calc Af Amer: >60 mL/min (ref >60)
GFR calc non Af Amer: >60 mL/min (ref >60)
Glucose, Bld: 113 mg/dL — ABNORMAL HIGH (ref 70–99)
Potassium: 3.9 mmol/L (ref 3.5–5.1)
Sodium: 140 mmol/L (ref 135–145)
Total Bilirubin: 0.6 mg/dL (ref 0.3–1.2)
Total Protein: 6.4 g/dL — ABNORMAL LOW (ref 6.5–8.1)

## 2019-10-09 LAB — ABO/RH: ABO/RH(D): A POS

## 2019-10-09 LAB — SARS CORONAVIRUS 2 (TAT 6-24 HRS): SARS Coronavirus 2: NEGATIVE

## 2019-10-09 SURGERY — COLONOSCOPY WITH PROPOFOL
Anesthesia: Monitor Anesthesia Care

## 2019-10-09 MED ORDER — ONDANSETRON HCL 4 MG/2ML IJ SOLN
4.0000 mg | Freq: Four times a day (QID) | INTRAMUSCULAR | Status: DC | PRN
  Administered 2019-10-09: 01:00:00 4 mg via INTRAVENOUS
  Filled 2019-10-09: qty 2

## 2019-10-09 MED ORDER — PROPOFOL 500 MG/50ML IV EMUL
INTRAVENOUS | Status: DC | PRN
  Administered 2019-10-09: 125 ug/kg/min via INTRAVENOUS

## 2019-10-09 MED ORDER — GLUCAGON HCL RDNA (DIAGNOSTIC) 1 MG IJ SOLR
INTRAMUSCULAR | Status: DC | PRN
  Administered 2019-10-09: 1 mg via INTRAVENOUS

## 2019-10-09 MED ORDER — SODIUM CHLORIDE 0.9 % IV SOLN: INTRAVENOUS | Status: DC

## 2019-10-09 MED ORDER — PROPOFOL 10 MG/ML IV BOLUS
INTRAVENOUS | Status: AC
  Filled 2019-10-09: qty 20

## 2019-10-09 MED ORDER — BENZTROPINE MESYLATE 1 MG PO TABS
1.0000 mg | ORAL_TABLET | Freq: Every day | ORAL | Status: DC
  Administered 2019-10-09 – 2019-10-10 (×2): 1 mg via ORAL
  Filled 2019-10-09 (×2): qty 1

## 2019-10-09 MED ORDER — TEMAZEPAM 15 MG PO CAPS
30.0000 mg | ORAL_CAPSULE | Freq: Every evening | ORAL | Status: DC | PRN
  Administered 2019-10-09: 21:00:00 30 mg via ORAL
  Filled 2019-10-09: qty 2

## 2019-10-09 MED ORDER — PANTOPRAZOLE SODIUM 40 MG PO TBEC
40.0000 mg | DELAYED_RELEASE_TABLET | Freq: Two times a day (BID) | ORAL | Status: DC
  Administered 2019-10-09 – 2019-10-10 (×2): 40 mg via ORAL
  Filled 2019-10-09 (×2): qty 1

## 2019-10-09 MED ORDER — LISINOPRIL 20 MG PO TABS
20.0000 mg | ORAL_TABLET | Freq: Every day | ORAL | Status: DC
  Administered 2019-10-09 – 2019-10-10 (×2): 20 mg via ORAL
  Filled 2019-10-09 (×2): qty 1

## 2019-10-09 MED ORDER — LACTATED RINGERS IV SOLN
INTRAVENOUS | Status: DC
  Administered 2019-10-09: 14:00:00 1000 mL via INTRAVENOUS

## 2019-10-09 MED ORDER — PROPOFOL 10 MG/ML IV BOLUS
INTRAVENOUS | Status: DC | PRN
  Administered 2019-10-09: 10 mg via INTRAVENOUS
  Administered 2019-10-09: 20 mg via INTRAVENOUS

## 2019-10-09 MED ORDER — PROPOFOL 500 MG/50ML IV EMUL
INTRAVENOUS | Status: AC
  Filled 2019-10-09: qty 50

## 2019-10-09 MED ORDER — CITALOPRAM HYDROBROMIDE 20 MG PO TABS
20.0000 mg | ORAL_TABLET | Freq: Every day | ORAL | Status: DC
  Administered 2019-10-09 – 2019-10-10 (×2): 20 mg via ORAL
  Filled 2019-10-09 (×2): qty 1

## 2019-10-09 MED ORDER — FLUTICASONE PROPIONATE 50 MCG/ACT NA SUSP
1.0000 | Freq: Every day | NASAL | Status: DC
  Administered 2019-10-09 – 2019-10-10 (×2): 1 via NASAL
  Filled 2019-10-09: qty 16

## 2019-10-09 MED ORDER — PANTOPRAZOLE SODIUM 40 MG IV SOLR
40.0000 mg | Freq: Two times a day (BID) | INTRAVENOUS | Status: DC
  Administered 2019-10-09 (×2): 40 mg via INTRAVENOUS
  Filled 2019-10-09 (×2): qty 40

## 2019-10-09 MED ORDER — TRAZODONE HCL 50 MG PO TABS
50.0000 mg | ORAL_TABLET | Freq: Every day | ORAL | Status: DC
  Administered 2019-10-09: 21:00:00 50 mg via ORAL
  Filled 2019-10-09: qty 1

## 2019-10-09 MED ORDER — SODIUM CHLORIDE 0.9 % IV SOLN
INTRAVENOUS | Status: DC
  Administered 2019-10-09: 01:00:00 100 mL/h via INTRAVENOUS

## 2019-10-09 MED ORDER — ACETAMINOPHEN 325 MG PO TABS
650.0000 mg | ORAL_TABLET | ORAL | Status: DC | PRN
  Filled 2019-10-09: qty 2

## 2019-10-09 MED ORDER — PROPOFOL 500 MG/50ML IV EMUL: INTRAVENOUS | Status: DC | PRN

## 2019-10-09 MED ORDER — ONDANSETRON HCL 4 MG PO TABS: 4.0000 mg | ORAL_TABLET | Freq: Four times a day (QID) | ORAL | Status: DC | PRN

## 2019-10-09 SURGICAL SUPPLY — 22 items

## 2019-10-09 NOTE — Anesthesia Preprocedure Evaluation (Signed)
Anesthesia Evaluation  Patient identified by MRN, date of birth, ID band Patient awake    Reviewed: Allergy & Precautions, NPO status , Patient's Chart, lab work & pertinent test results  Airway Mallampati: II  TM Distance: >3 FB Neck ROM: Full    Dental no notable dental hx.    Pulmonary neg pulmonary ROS,    Pulmonary exam normal breath sounds clear to auscultation       Cardiovascular hypertension, Pt. on medications Normal cardiovascular exam Rhythm:Regular Rate:Normal  LBBB   Neuro/Psych negative neurological ROS  negative psych ROS   GI/Hepatic negative GI ROS, Neg liver ROS,   Endo/Other  negative endocrine ROS  Renal/GU negative Renal ROS  negative genitourinary   Musculoskeletal negative musculoskeletal ROS (+)   Abdominal   Peds negative pediatric ROS (+)  Hematology negative hematology ROS (+)   Anesthesia Other Findings   Reproductive/Obstetrics negative OB ROS                             Anesthesia Physical  Anesthesia Plan  ASA: III  Anesthesia Plan: MAC   Post-op Pain Management:    Induction: Intravenous  PONV Risk Score and Plan: 0  Airway Management Planned: Simple Face Mask  Additional Equipment:   Intra-op Plan:   Post-operative Plan:   Informed Consent: I have reviewed the patients History and Physical, chart, labs and discussed the procedure including the risks, benefits and alternatives for the proposed anesthesia with the patient or authorized representative who has indicated his/her understanding and acceptance.     Dental advisory given  Plan Discussed with: CRNA and Surgeon  Anesthesia Plan Comments:         Anesthesia Quick Evaluation

## 2019-10-09 NOTE — ED Notes (Signed)
MD notified that patient wants nasal spray.

## 2019-10-09 NOTE — Transfer of Care (Signed)
Immediate Anesthesia Transfer of Care Note  Patient: Sean Hill  Procedure(s) Performed: COLONOSCOPY WITH PROPOFOL (N/A )  Patient Location: PACU  Anesthesia Type:MAC  Level of Consciousness: awake, alert  and oriented  Airway & Oxygen Therapy: Patient Spontanous Breathing and Patient connected to face mask oxygen  Post-op Assessment: Report given to RN, Post -op Vital signs reviewed and stable and Patient moving all extremities X 4  Post vital signs: Reviewed and stable  Last Vitals:  Vitals Value Taken Time  BP    Temp    Pulse 89 10/09/19 1529  Resp 20 10/09/19 1529  SpO2 97 % 10/09/19 1529  Vitals shown include unvalidated device data.  Last Pain:  Vitals:   10/09/19 1416  TempSrc: Oral  PainSc: 3          Complications: No apparent anesthesia complications

## 2019-10-09 NOTE — Anesthesia Procedure Notes (Signed)
Procedure Name: MAC Date/Time: 10/09/2019 2:35 PM Performed by: Niel Hummer, CRNA Pre-anesthesia Checklist: Patient identified, Emergency Drugs available, Suction available and Patient being monitored Oxygen Delivery Method: Simple face mask

## 2019-10-09 NOTE — Op Note (Signed)
[facility] Patient Name: Sean Hill Procedure Date: 10/09/2019 MRN: [id number] Attending MD: Gatha Mayer , MD Date of Birth: 1941/07/18 CSN: [id number] Age: 79 Admit Type: Outpatient Procedure:                Colonoscopy Indications:              Treatment of bleeding from polypectomy site Providers:                Gatha Mayer, MD, Cleda Daub, RN, Lina Sar, Rainey Pines CRNA Referring MD:              Medicines:                Propofol per Anesthesia, Monitored Anesthesia Care Complications:            No immediate complications. Estimated Blood Loss:     Estimated blood loss was minimal. Procedure:                Pre-Anesthesia Assessment:                           - Prior to the procedure, a History and Physical                            was performed, and patient medications and                            allergies were reviewed. The patient's tolerance of                            previous anesthesia was also reviewed. The risks                            and benefits of the procedure and the sedation                            options and risks were discussed with the patient.                            All questions were answered, and informed consent                            was obtained. Prior Anticoagulants: The patient has                            taken no previous anticoagulant or antiplatelet                            agents. ASA Grade Assessment: III - A patient with                            severe systemic disease. After reviewing the risks  and benefits, the patient was deemed in                            satisfactory condition to undergo the procedure.                           After obtaining informed consent, the colonoscope                            was passed under direct vision. Throughout the                            procedure, the patient's  blood pressure, pulse, and                            oxygen saturations were monitored continuously. The                            [id number]) Olympus colonoscope was                            introduced through the anus and advanced to the the                            cecum, identified by appendiceal orifice and                            ileocecal valve. The colonoscopy was performed with                            difficulty due to colonic movement with                            respiration. The patient tolerated the procedure                            well. The quality of the bowel preparation was                            good. The ileocecal valve and the appendiceal                            orifice were photographed. Scope In: 2:45:15 PM Scope Out: 3:22:18 PM Scope Withdrawal Time: 0 hours 34 minutes 24 seconds  Total Procedure Duration: 0 hours 37 minutes 3 seconds  Findings:      The perianal and digital rectal examinations were normal.      A single (solitary) twenty mm ulcer was found in the proximal ascending       colon. No bleeding was present at start. Stigmata of recent bleeding       were present. As I clipped some transient bleeding occurred. Very       difficult to see and approach due to severe repsiratory motion of the       colon helped some by abdominal pressure. For hemostasis, eleven  hemostatic clips were successfully placed (MR conditional). Initial       clips placed near to change orientation and improve visualization and       approach. There was no bleeding at the end of the procedure. Estimated       blood loss was minimal.      The exam was otherwise without abnormality on direct and retroflexion       views. Impression:               - A single (solitary) ulcer in the proximal                            ascending colon. Clips (MR conditional) were                            placed. 11/13 on tissue.                           - The  examination was otherwise normal on direct                            and retroflexion views.                           - No specimens collected. Moderate Sedation:      Not Applicable - Patient had care per Anesthesia. Recommendation:           - Observe patient in Floor for ongoing care. Home                            tomorrow if ok.                           - Clear liquid diet. Procedure Code(s):        --- Professional ---                           [phone], Colonoscopy, flexible; with control of                            bleeding, any method Diagnosis Code(s):        --- Professional ---                           K63.3, Ulcer of intestine                           K91.840, Postprocedural hemorrhage of a digestive                            system organ or structure following a digestive                            system procedure CPT copyright 2019 [employer]. All rights reserved. The codes documented in this report are preliminary and upon coder review may  be revised to meet current compliance requirements. Gatha Mayer, MD 10/09/2019 5:45:33 PM This  report has been signed electronically. Number of Addenda: 0

## 2019-10-09 NOTE — Anesthesia Postprocedure Evaluation (Signed)
Anesthesia Post Note  Patient: Sean Hill  Procedure(s) Performed: COLONOSCOPY WITH PROPOFOL (N/A ) HEMOSTASIS CLIP PLACEMENT     Patient location during evaluation: PACU Anesthesia Type: MAC Level of consciousness: awake and alert Pain management: pain level controlled Vital Signs Assessment: post-procedure vital signs reviewed and stable Respiratory status: spontaneous breathing, nonlabored ventilation, respiratory function stable and patient connected to nasal cannula oxygen Cardiovascular status: stable and blood pressure returned to baseline Postop Assessment: no apparent nausea or vomiting Anesthetic complications: no    Last Vitals:  Vitals:   10/09/19 1547 10/09/19 1649  BP: (!) 179/78 136/74  Pulse: 80 79  Resp: 14 16  Temp:  36.8 C  SpO2: 97% 96%    Last Pain:  Vitals:   10/09/19 1649  TempSrc: Oral  PainSc: 0-No pain                 Sadie Pickar

## 2019-10-09 NOTE — Consult Note (Signed)
Referring Provider: EDP Primary Care Physician:  [name], NP Primary Gastroenterologist:  Dr. Kelby Aline  Reason for Consultation:  Post-polypectomy GI bleed  HPI: Sean Hill is a 79 y.o. male s/p EMR of a 2.5 cm flat polyp in the proximal ascending colon (NG-LST type, Bx- TV adenoma with HGD) on Jan 13, now with post-polypectomy bleeding. This started at [facility] on 1/19. Dark red blood with clots.  Has been on Baby ASA.   HD stable. Hb stable 13.4 grams upon initial evaluation.  Now 12.3 grams.  Had some dizziness but no syncope.  Drank all of bowel prep for colonoscopy today.  Says that he had a lot of blood initially when he started going from the prep, but this AM I saw mostly yellow liquid stool in the [facility] with minimal to no blood.   Past Medical History:  Diagnosis Date  . Anal fissure   . Anxiety   . Arthritis    right wrist; pt. states "everywhere"  . Asthma   . Bipolar disorder ([place])   . Cataract   . Depression   . GERD (gastroesophageal reflux disease)   . History of kidney stones   . 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 Hyperlipidemia   . Hypertension    states under control with med., has been on med. x 1 yr.  . Impaired hearing   . Left bundle branch block (LBBB) on electrocardiogram 10/29/2015  . Macular degeneration (senile) of retina    left  . Wears glasses     Past Surgical History:  Procedure Laterality Date  . ANKLE FUSION Bilateral   . ANKLE SURGERY Bilateral    ligament surgery  . CARDIAC CATHETERIZATION  x 2   1983; 11/14/2003  . CARPOMETACARPEL SUSPENSION PLASTY Right 09/22/2016   Procedure: right thumb carpometacarpal  ARTHROPLASTY;  Surgeon: Leanora Cover, MD;  Location: [place];  Service: Orthopedics;  Laterality: Right;  right thumb carpometacarpal  ARTHROPLASTY  . CARPOMETACARPEL SUSPENSION PLASTY Right 11/02/2017   Procedure: RIGHT THUMB SUSPENSIONPLASTY WITH TIGHTROPE, DISTAL POLE  SCAPHOID  EXCISION;  Surgeon: Leanora Cover, MD;  Location: [place];  Service: Orthopedics;  Laterality: Right;  . COLONOSCOPY WITH PROPOFOL N/A 10/02/2019   Procedure: COLONOSCOPY WITH PROPOFOL;  Surgeon: Lavena Bullion, DO;  Location: [facility];  Service: Gastroenterology;  Laterality: N/A;  . ELBOW SURGERY Left   . ENDOSCOPIC MUCOSAL RESECTION N/A 10/02/2019   Procedure: ENDOSCOPIC MUCOSAL RESECTION;  Surgeon: Lavena Bullion, DO;  Location: [facility];  Service: Gastroenterology;  Laterality: N/A;  . INGUINAL HERNIA REPAIR Right   . OPEN REDUCTION INTERNAL FIXATION (ORIF) DISTAL RADIAL FRACTURE Right 10/29/2015   Procedure: OPEN REDUCTION INTERNAL FIXATION (ORIF) DISTAL RADIAL FRACTURE;  Surgeon: Leanora Cover, MD;  Location: [place];  Service: Orthopedics;  Laterality: Right;  . ORIF ELBOW FRACTURE Right   . SUBMUCOSAL LIFTING INJECTION  10/02/2019   Procedure: SUBMUCOSAL LIFTING INJECTION;  Surgeon: Lavena Bullion, DO;  Location: [facility];  Service: Gastroenterology;;  . TONSILLECTOMY    . TOTAL KNEE ARTHROPLASTY Bilateral   . ULNAR COLLATERAL LIGAMENT REPAIR Right 12/29/2015   Procedure: REPAIR  RIGHT LATERAL ULNAR COLLATERAL LIGAMENT TEAR  EXTENSOR ORIGIN ;  Surgeon: Leanora Cover, MD;  Location: [place];  Service: Orthopedics;  Laterality: Right;  . WRIST ARTHROSCOPY WITH DEBRIDEMENT Right 07/14/2016   Procedure: RIGHT WRIST ARTHROSCOPY WITH DEBRIDEMENT  TRIANGULAR FIBROCARTILAGE COMPLEX;  Surgeon:  Leanora Cover, MD;  Location: [place];  Service: Orthopedics;  Laterality: Right;    Prior to Admission medications   Medication Sig Start Date End Date Taking? Authorizing Provider  Alum Hydroxide-Mag Carbonate (GAVISCON PO) Take 1 tablet by mouth 3 (three) times daily as needed (heartburn).    Yes [provider]  ARIPiprazole ER (ABILIFY MAINTENA) 400 MG PRSY prefilled syringe Inject 400 mg into the  muscle every 28 (twenty-eight) days. 09/25/19 09/24/20 Yes Arfeen, Arlyce Harman, MD  aspirin 81 MG tablet Take 81 mg by mouth daily.   Yes [provider]  atorvastatin (LIPITOR) 40 MG tablet TAKE 1 TABLET BY MOUTH EVERY DAY Patient taking differently: Take 40 mg by mouth daily at 6 PM.  05/07/19  Yes Nafziger, Tommi Rumps, NP  benztropine (COGENTIN) 1 MG tablet Take 1 tablet (1 mg total) by mouth daily. 09/25/19  Yes Arfeen, Arlyce Harman, MD  citalopram (CELEXA) 20 MG tablet Take 1 tablet (20 mg total) by mouth daily. 09/25/19 09/24/20 Yes Arfeen, Arlyce Harman, MD  fenofibrate (TRICOR) 145 MG tablet Take 1 tablet (145 mg total) by mouth daily. 11/29/18  Yes Nafziger, Tommi Rumps, NP  lisinopril (PRINIVIL,ZESTRIL) 20 MG tablet Take 20 mg by mouth daily.   Yes [provider]  Multiple Vitamin (MULTIVITAMIN WITH MINERALS) TABS tablet Take 1 tablet by mouth daily.   Yes [provider]  Multiple Vitamins-Minerals (PRESERVISION AREDS 2 PO) Take 1 capsule by mouth 2 (two) times daily.   Yes [provider]  Omega-3 Fatty Acids (FISH OIL PO) Take 1 capsule by mouth daily.    Yes [provider]  oxymetazoline (AFRIN) 0.05 % nasal spray Place 1 spray into both nostrils 2 (two) times daily as needed for congestion.   Yes [provider]  pantoprazole (PROTONIX) 40 MG tablet Take 1 tablet (40 mg total) by mouth 2 (two) times daily. 08/26/19  Yes Cirigliano, Vito V, DO  temazepam (RESTORIL) 30 MG capsule Take 1 capsule (30 mg total) by mouth at bedtime as needed. for sleep Patient taking differently: Take 30 mg by mouth at bedtime.  09/25/19  Yes Arfeen, Arlyce Harman, MD  traZODone (DESYREL) 50 MG tablet Take 1 tablet (50 mg total) by mouth at bedtime. 09/25/19  Yes Arfeen, Arlyce Harman, MD  Vitamin D, Cholecalciferol, 1000 UNITS TABS Take 1,000 Units by mouth daily.    Yes [provider]  albuterol (VENTOLIN HFA) 108 (90 Base) MCG/ACT inhaler Inhale 2 puffs into the lungs every 6 (six) hours as needed  for wheezing or shortness of breath. Patient not taking: Reported on 09/26/2019 06/06/19   [name], NP  nystatin (MYCOSTATIN/NYSTOP) powder Apply 1 application topically 3 (three) times daily. Patient not taking: Reported on 09/26/2019 09/24/19   [name], NP    Current Facility-Administered Medications  Medication Dose Route Frequency Provider Last Rate Last Admin  . 0.9 %  sodium chloride infusion   Intravenous Continuous Jackquline Denmark, MD   Stopped at 10/09/19 0117  . 0.9 %  sodium chloride infusion   Intravenous Continuous Gala Romney L, MD 100 mL/hr at 10/09/19 0116 100 mL/hr at 10/09/19 0116  . fluticasone (FLONASE) 50 MCG/ACT nasal spray 1 spray  1 spray Each Nare Daily Garba, Mohammad L, MD      . ondansetron (ZOFRAN) tablet 4 mg  4 mg Oral Q6H PRN Elwyn Reach, MD       Or  . ondansetron (ZOFRAN) injection 4 mg  4 mg Intravenous Q6H  PRN Elwyn Reach, MD   4 mg at 10/09/19 0117  . pantoprazole (PROTONIX) injection 40 mg  40 mg Intravenous Q12H Elwyn Reach, MD   40 mg at 10/09/19 0117   Current Outpatient Medications  Medication Sig Dispense Refill  . Alum Hydroxide-Mag Carbonate (GAVISCON PO) Take 1 tablet by mouth 3 (three) times daily as needed (heartburn).     . ARIPiprazole ER (ABILIFY MAINTENA) 400 MG PRSY prefilled syringe Inject 400 mg into the muscle every 28 (twenty-eight) days. 1 each 2  . aspirin 81 MG tablet Take 81 mg by mouth daily.    Marland Kitchen atorvastatin (LIPITOR) 40 MG tablet TAKE 1 TABLET BY MOUTH EVERY DAY (Patient taking differently: Take 40 mg by mouth daily at 6 PM. ) 90 tablet 1  . benztropine (COGENTIN) 1 MG tablet Take 1 tablet (1 mg total) by mouth daily. 90 tablet 0  . citalopram (CELEXA) 20 MG tablet Take 1 tablet (20 mg total) by mouth daily. 90 tablet 0  . fenofibrate (TRICOR) 145 MG tablet Take 1 tablet (145 mg total) by mouth daily. 90 tablet 3  . lisinopril (PRINIVIL,ZESTRIL) 20 MG tablet Take 20 mg by mouth daily.    . Multiple  Vitamin (MULTIVITAMIN WITH MINERALS) TABS tablet Take 1 tablet by mouth daily.    . Multiple Vitamins-Minerals (PRESERVISION AREDS 2 PO) Take 1 capsule by mouth 2 (two) times daily.    . Omega-3 Fatty Acids (FISH OIL PO) Take 1 capsule by mouth daily.     Marland Kitchen oxymetazoline (AFRIN) 0.05 % nasal spray Place 1 spray into both nostrils 2 (two) times daily as needed for congestion.    . pantoprazole (PROTONIX) 40 MG tablet Take 1 tablet (40 mg total) by mouth 2 (two) times daily. 180 tablet 3  . temazepam (RESTORIL) 30 MG capsule Take 1 capsule (30 mg total) by mouth at bedtime as needed. for sleep (Patient taking differently: Take 30 mg by mouth at bedtime. ) 90 capsule 0  . traZODone (DESYREL) 50 MG tablet Take 1 tablet (50 mg total) by mouth at bedtime. 90 tablet 0  . Vitamin D, Cholecalciferol, 1000 UNITS TABS Take 1,000 Units by mouth daily.     Marland Kitchen albuterol (VENTOLIN HFA) 108 (90 Base) MCG/ACT inhaler Inhale 2 puffs into the lungs every 6 (six) hours as needed for wheezing or shortness of breath. (Patient not taking: Reported on 09/26/2019) 6.7 g 1  . nystatin (MYCOSTATIN/NYSTOP) powder Apply 1 application topically 3 (three) times daily. (Patient not taking: Reported on 09/26/2019) 15 g 3    Allergies as of 10/08/2019 - Review Complete 10/08/2019  Allergen Reaction Noted  . Penicillins Other (See Comments) 07/20/2011  . Codeine Rash 04/23/2013    Family History  Problem Relation Age of Onset  . Alcohol abuse Mother   . Ovarian cancer Mother   . Alcohol abuse Father   . Pancreatic cancer Father   . Depression Daughter   . Paranoid behavior Daughter   . Hyperlipidemia Brother   . Barrett's esophagus Brother   . Colon cancer Maternal Grandmother   . Esophageal cancer Neg Hx   . Stomach cancer Neg Hx   . Rectal cancer Neg Hx     Social History   Socioeconomic History  . Marital status: Married    Spouse name: Not on file  . Number of children: Not on file  . Years of education: Not on  file  . Highest education level: Not on file  Occupational History  .  Not on file  Tobacco Use  . Smoking status: Never Smoker  . Smokeless tobacco: Never Used  Substance and Sexual Activity  . Alcohol use: Not Currently  . Drug use: No  . Sexual activity: Not Currently    Birth control/protection: None  Other Topics Concern  . Not on file  Social History Narrative   Married, lives in [place].  Retired Dance movement psychotherapist.   Two daughters, both married,3 grandchildren ( 5, 60, 1 year)    No regular exercise.  Never smoker.  No ETOH.  No drugs.   Social Determinants of Health   Financial Resource Strain:   . Difficulty of Paying Living Expenses: Not on file  Food Insecurity:   . Worried About Charity fundraiser in the Last Year: Not on file  . Ran Out of Food in the Last Year: Not on file  Transportation Needs:   . Lack of Transportation (Medical): Not on file  . Lack of Transportation (Non-Medical): Not on file  Physical Activity:   . Days of Exercise per Week: Not on file  . Minutes of Exercise per Session: Not on file  Stress:   . Feeling of Stress : Not on file  Social Connections:   . Frequency of Communication with Friends and Family: Not on file  . Frequency of Social Gatherings with Friends and Family: Not on file  . Attends Religious Services: Not on file  . Active Member of Clubs or Organizations: Not on file  . Attends Archivist Meetings: Not on file  . Marital Status: Not on file  Intimate Partner Violence:   . Fear of Current or Ex-Partner: Not on file  . Emotionally Abused: Not on file  . Physically Abused: Not on file  . Sexually Abused: Not on file    Review of Systems: ROS is O/W negative except as mentioned in HPI.  Physical Exam: Vital signs in last 24 hours: Temp:  [98.3 F (36.8 C)] 98.3 F (36.8 C) (01/20 0546) Pulse Rate:  [70-94] 73 (01/20 0830) Resp:  [12-24] 16 (01/20 0830) BP: (107-150)/(51-90) 124/57 (01/20 0830) SpO2:   [92 %-99 %] 94 % (01/20 0830) Weight:  [115.7 kg] 115.7 kg (01/19 2018)   General:  Alert, Well-developed, well-nourished, pleasant and cooperative in NAD Head:  Normocephalic and atraumatic. Eyes:  Sclera clear, no icterus.  Conjunctiva pink. Ears:  Normal auditory acuity. Mouth:  No deformity or lesions.   Lungs:  Clear throughout to auscultation.  No wheezes, crackles, or rhonchi.  Heart:  Regular rate and rhythm; no M/R/G. Abdomen:  Soft, non-distended.  BS present.  Non-tender.  Msk:  Symmetrical without gross deformities. Pulses:  Normal pulses noted. Extremities:  Without clubbing or edema. Neurologic:  Alert and oriented x 4;  grossly normal neurologically. Skin:  Intact without significant lesions or rashes.  Several tattoos noted. Psych:  Alert and cooperative. Normal mood and affect.  Intake/Output from previous day: 01/19 0701 - 01/20 0700 In: 521.5 [I.V.:21.5; IV Piggyback:500] Out: -   Lab Results: Recent Labs    10/08/19 2038 10/09/19 0040  WBC 7.7 8.9  HGB 13.4 12.3*  HCT 40.7 37.3*  PLT 261 233   BMET Recent Labs    10/08/19 2038 10/09/19 0500  NA 141 140  K 3.7 3.9  CL 109 109  CO2 23 22  GLUCOSE 123* 113*  BUN 17 17  CREATININE 1.19 1.10  CALCIUM 9.1 9.0   LFT Recent Labs    10/09/19 0500  PROT 6.4*  ALBUMIN 3.9  AST 29  ALT 21  ALKPHOS 59  BILITOT 0.6   IMPRESSION:  *Post polypectomy GI bleed:  s/p EMR 2.5 cm flat polyp (NG-LST type, Bx- TV adenoma with HGD) on Jan 13, now with post-polypectomy bleeding. Started 6PM on 1/19. Has been on Baby ASA.  Hgb down about 2 grams from baseline, but still ok.  PLAN: -Colonoscopy later this AM for evaluation and intervention with clipping, etc.  Laban Emperor. Kalese Ensz  10/09/2019, 9:00 AM

## 2019-10-09 NOTE — Brief Op Note (Addendum)
10/08/2019 - 10/09/2019  3:31 PM  PATIENT:  Sean Hill  79 y.o. male  PRE-OPERATIVE DIAGNOSIS:  post polypectomy bleed  POST-OPERATIVE DIAGNOSIS:  same  PROCEDURE:  Procedure(s): COLONOSCOPY WITH PROPOFOL (N/A)  SURGEON:  Surgeon(s) and Role:    * Gatha Mayer, MD - Primary    ANESTHESIA:  MAC  EBL: minimal    FINDINGS:  Polypectomy ulcer proximal ascending colon across from IC valve  Some bleeding during clip placement very transient  Extreme resp motion and location problematic with treatment but eventually got 11 of 13 clips placed - some of original clips intentionally placed near but not on ulcer to change configuration  Will observe overnight  Wife updated by phone

## 2019-10-10 ENCOUNTER — Encounter (HOSPITAL_COMMUNITY): Payer: Self-pay | Admitting: Internal Medicine

## 2019-10-10 LAB — CBC
HCT: 35.6 % — ABNORMAL LOW (ref 39.0–52.0)
Hemoglobin: 12 g/dL — ABNORMAL LOW (ref 13.0–17.0)
MCH: 29.4 pg (ref 26.0–34.0)
MCHC: 33.7 g/dL (ref 30.0–36.0)
MCV: 87.3 fL (ref 80.0–100.0)
Platelets: 219 10*3/uL (ref 150–400)
RBC: 4.08 MIL/uL — ABNORMAL LOW (ref 4.22–5.81)
RDW: 14.6 % (ref 11.5–15.5)
WBC: 6.5 10*3/uL (ref 4.0–10.5)
nRBC: 0 % (ref 0.0–0.2)

## 2019-10-10 NOTE — Discharge Summary (Signed)
[facility] Discharge Summary  Name: Sean Hill MRN: [id number] DOB: [birth year] 79 y.o. PCP:  Sean Peng, NP  Date of Admission: 10/08/2019  9:15 PM Date of Discharge: 10/10/2019 Primary Gastroenterologist: Sean Hill Discharging Physician:Sean Hill  Discharge Diagnosis: Principal Problem:   Hemorrhage of colon following colonoscopy Active Problems:   Bipolar disorder ([place])   Gastroesophageal reflux disease   Hyperlipidemia   Essential hypertension   Adenomatous polyp of ascending colon        GI Procedures: Colonoscopy with clipping of polypectomy site 10/10/2019  History/Physical Exam:  See Admission H&P  Admission HPI: started having melena and blood in stool on 1/19, had 25 mm TV adenoma removed from ascending colon 1/13. Colonoscopy w/ clipping 10/09/19  Hospital Course by problem list: Principal Problem:   Hemorrhage of colon following colonoscopy Active Problems:   Bipolar disorder ([place])   Gastroesophageal reflux disease   Hyperlipidemia   Essential hypertension   Adenomatous polyp of ascending colon   He did well w/o stools or bleeding after colonoscopy and was released from observation.  Discharge Vitals:  BP (!) 123/55   Pulse 70   Temp 97.7 F (36.5 C) (Oral)   Resp 16   Ht 5\' 9"  (1.753 m)   Wt 115.7 kg   SpO2 93%   BMI 37.66 kg/m   See HPI  Discharge Labs:    CBC Latest Ref Rng & Units 10/10/2019 10/09/2019 10/09/2019  WBC 4.0 - 10.5 K/uL 6.5 8.5 6.8  Hemoglobin 13.0 - 17.0 g/dL 12.0(L) 11.9(L) 12.1(L)  Hematocrit 39.0 - 52.0 % 35.6(L) 35.4(L) 36.5(L)  Platelets 150 - 400 K/uL 219 222 239     Results for orders placed or performed during the hospital encounter of 10/08/19 (from the past 24 hour(s))  CBC     Status: Abnormal   Collection Time: 10/09/19  1:22 PM  Result Value Ref Range   WBC 6.8 4.0 - 10.5 K/uL   RBC 4.26 4.22 - 5.81 MIL/uL   Hemoglobin 12.1 (L) 13.0 - 17.0 g/dL   HCT 36.5 (L) 39.0 - 52.0 %   MCV  85.7 80.0 - 100.0 fL   MCH 28.4 26.0 - 34.0 pg   MCHC 33.2 30.0 - 36.0 g/dL   RDW 14.4 11.5 - 15.5 %   Platelets 239 150 - 400 K/uL   nRBC 0.0 0.0 - 0.2 %  CBC     Status: Abnormal   Collection Time: 10/09/19  5:59 PM  Result Value Ref Range   WBC 8.5 4.0 - 10.5 K/uL   RBC 4.11 (L) 4.22 - 5.81 MIL/uL   Hemoglobin 11.9 (L) 13.0 - 17.0 g/dL   HCT 35.4 (L) 39.0 - 52.0 %   MCV 86.1 80.0 - 100.0 fL   MCH 29.0 26.0 - 34.0 pg   MCHC 33.6 30.0 - 36.0 g/dL   RDW 14.5 11.5 - 15.5 %   Platelets 222 150 - 400 K/uL   nRBC 0.0 0.0 - 0.2 %  CBC     Status: Abnormal   Collection Time: 10/10/19  5:48 AM  Result Value Ref Range   WBC 6.5 4.0 - 10.5 K/uL   RBC 4.08 (L) 4.22 - 5.81 MIL/uL   Hemoglobin 12.0 (L) 13.0 - 17.0 g/dL   HCT 35.6 (L) 39.0 - 52.0 %   MCV 87.3 80.0 - 100.0 fL   MCH 29.4 26.0 - 34.0 pg   MCHC 33.7 30.0 - 36.0 g/dL   RDW 14.6 11.5 - 15.5 %   Platelets  219 150 - 400 K/uL   nRBC 0.0 0.0 - 0.2 %    Disposition and follow-up:   Sean Hill was discharged from [facility] in stable condition.    Follow-up Appointments:  Sean Hill 10/24/2019 Discharge Medications: Allergies as of 10/10/2019      Reactions   Penicillins Other (See Comments)   BLISTERS Did it involve swelling of the face/tongue/throat, SOB, or low BP? No Did it involve sudden or severe rash/hives, skin peeling, or any reaction on the inside of your mouth or nose? No Did you need to seek medical attention at a hospital or doctor's office? No When did it last happen?around 1980 If all above answers are "NO", may proceed with cephalosporin use.   Codeine Rash      Medication List    STOP taking these medications   nystatin powder Commonly known as: MYCOSTATIN/NYSTOP     TAKE these medications   albuterol 108 (90 Base) MCG/ACT inhaler Commonly known as: VENTOLIN HFA Inhale 2 puffs into the lungs every 6 (six) hours as needed for wheezing or shortness of breath.    ARIPiprazole ER 400 MG Prsy prefilled syringe Commonly known as: ABILIFY MAINTENA Inject 400 mg into the muscle every 28 (twenty-eight) days.   aspirin 81 MG tablet Take 1 tablet (81 mg total) by mouth daily. Do not take again until 10/24/2019 What changed: additional instructions   atorvastatin 40 MG tablet Commonly known as: LIPITOR Take 1 tablet (40 mg total) by mouth daily at 6 PM.   benztropine 1 MG tablet Commonly known as: COGENTIN Take 1 tablet (1 mg total) by mouth daily.   citalopram 20 MG tablet Commonly known as: CeleXA Take 1 tablet (20 mg total) by mouth daily.   fenofibrate 145 MG tablet Commonly known as: TRICOR Take 1 tablet (145 mg total) by mouth daily.   FISH OIL PO Take 1 capsule by mouth daily.   GAVISCON PO Take 1 tablet by mouth 3 (three) times daily as needed (heartburn).   lisinopril 20 MG tablet Commonly known as: ZESTRIL Take 20 mg by mouth daily.   multivitamin with minerals Tabs tablet Take 1 tablet by mouth daily.   oxymetazoline 0.05 % nasal spray Commonly known as: AFRIN Place 1 spray into both nostrils 2 (two) times daily as needed for congestion.   pantoprazole 40 MG tablet Commonly known as: PROTONIX Take 1 tablet (40 mg total) by mouth 2 (two) times daily.   PRESERVISION AREDS 2 PO Take 1 capsule by mouth 2 (two) times daily.   temazepam 30 MG capsule Commonly known as: RESTORIL Take 1 capsule (30 mg total) by mouth at bedtime as needed. for sleep What changed:   when to take this  additional instructions   traZODone 50 MG tablet Commonly known as: DESYREL Take 1 tablet (50 mg total) by mouth at bedtime.   Vitamin D (Cholecalciferol) 25 MCG (1000 UT) Tabs Take 1,000 Units by mouth daily.       Signed: Silvano Hill 10/10/2019, 8:42 AM

## 2019-10-10 NOTE — Discharge Instructions (Signed)
Gradually increase activity as tolerated.  Your blood count is just slightly low and should not cause significant problems.  You could see a little bit of blood with next 1-2 bowel movements but if you see large amounts or have questions call Dr. Vivia Ewing office.  I have stopped your aspirin for 2 weeks - until 10/24/19  SYMPTOMS TO REPORT IMMEDIATELY: A gastroenterologist can be reached at any hour. Please call [phone]  for any of the following symptoms:  Following lower endoscopy (colonoscopy, flexible sigmoidoscopy) Excessive amounts of blood in the stool  Significant tenderness, worsening of abdominal pains  Swelling of the abdomen that is new, acute  Fever of 100 or higher

## 2019-10-15 ENCOUNTER — Telehealth: Payer: Self-pay | Admitting: Gastroenterology

## 2019-10-15 NOTE — Telephone Encounter (Signed)
Good Morning,  The no show status was placed on 10/09/2019 there is no way to remove this status at this time. Please let me know if there is something further I can do.   Thank you, Lorriane Shire

## 2019-10-15 NOTE — Telephone Encounter (Signed)
Pt stated that he had "no showed" his 10/09/19 appt because he had a hospital procedure with Dr. Carlean Purl on that day.  Please remove the no show.

## 2019-10-15 NOTE — Telephone Encounter (Signed)
Im not sure how to remove the no show for the 10/11/19 appt. If you know how, then please remove it.

## 2019-10-16 ENCOUNTER — Ambulatory Visit: Payer: Medicare HMO

## 2019-10-17 ENCOUNTER — Other Ambulatory Visit: Payer: Self-pay | Admitting: Adult Health

## 2019-10-17 DIAGNOSIS — I10 Essential (primary) hypertension: Secondary | ICD-10-CM

## 2019-10-21 ENCOUNTER — Ambulatory Visit (INDEPENDENT_AMBULATORY_CARE_PROVIDER_SITE_OTHER): Payer: Medicare HMO | Admitting: *Deleted

## 2019-10-21 ENCOUNTER — Other Ambulatory Visit: Payer: Self-pay

## 2019-10-21 VITALS — BP 160/65 | HR 80 | Resp 20 | Wt 255.0 lb

## 2019-10-21 DIAGNOSIS — F3131 Bipolar disorder, current episode depressed, mild: Secondary | ICD-10-CM | POA: Insufficient documentation

## 2019-10-21 DIAGNOSIS — F319 Bipolar disorder, unspecified: Secondary | ICD-10-CM

## 2019-10-21 DIAGNOSIS — R69 Illness, unspecified: Secondary | ICD-10-CM | POA: Diagnosis not present

## 2019-10-21 MED ORDER — ARIPIPRAZOLE ER 400 MG IM PRSY
400.0000 mg | PREFILLED_SYRINGE | INTRAMUSCULAR | Status: DC
  Administered 2019-10-21 – 2023-10-19 (×50): 400 mg via INTRAMUSCULAR

## 2019-10-21 NOTE — Progress Notes (Signed)
Pt presents today for due Abilify Maintena 400mg  injection. Pt is appropriate, pleasant and cooperative on approach. Pt denies any issues with mood, s.i., or avh. Injection given in left upper outer quadrant without compliant. Pt instructed to call clinic with any concerns, issues, prior to next monthly injection. Pt verbalizes understanding.

## 2019-10-22 ENCOUNTER — Telehealth (HOSPITAL_COMMUNITY): Payer: Self-pay | Admitting: *Deleted

## 2019-10-22 NOTE — Telephone Encounter (Signed)
Pt Temazepam PA denied. [employer] recommending Doxepine. Writer has started appeal process and is awaiting determination.

## 2019-10-22 NOTE — Telephone Encounter (Signed)
Doxepin is antidepressant and cannot replace with temazepam.  We will wait for temazepam appeal process.

## 2019-10-24 ENCOUNTER — Ambulatory Visit: Payer: Medicare HMO | Admitting: Gastroenterology

## 2019-10-24 ENCOUNTER — Encounter: Payer: Self-pay | Admitting: Gastroenterology

## 2019-10-24 ENCOUNTER — Other Ambulatory Visit: Payer: Self-pay

## 2019-10-24 VITALS — BP 130/70 | HR 82 | Temp 97.1°F | Ht 69.5 in | Wt 255.0 lb

## 2019-10-24 DIAGNOSIS — K5730 Diverticulosis of large intestine without perforation or abscess without bleeding: Secondary | ICD-10-CM

## 2019-10-24 DIAGNOSIS — R053 Chronic cough: Secondary | ICD-10-CM

## 2019-10-24 DIAGNOSIS — K449 Diaphragmatic hernia without obstruction or gangrene: Secondary | ICD-10-CM | POA: Diagnosis not present

## 2019-10-24 DIAGNOSIS — D126 Benign neoplasm of colon, unspecified: Secondary | ICD-10-CM

## 2019-10-24 DIAGNOSIS — K640 First degree hemorrhoids: Secondary | ICD-10-CM | POA: Diagnosis not present

## 2019-10-24 DIAGNOSIS — Z6838 Body mass index (BMI) 38.0-38.9, adult: Secondary | ICD-10-CM | POA: Diagnosis not present

## 2019-10-24 DIAGNOSIS — Z86010 Personal history of colonic polyps: Secondary | ICD-10-CM | POA: Diagnosis not present

## 2019-10-24 DIAGNOSIS — R05 Cough: Secondary | ICD-10-CM

## 2019-10-24 DIAGNOSIS — K219 Gastro-esophageal reflux disease without esophagitis: Secondary | ICD-10-CM

## 2019-10-24 DIAGNOSIS — R69 Illness, unspecified: Secondary | ICD-10-CM | POA: Diagnosis not present

## 2019-10-24 DIAGNOSIS — F411 Generalized anxiety disorder: Secondary | ICD-10-CM | POA: Diagnosis not present

## 2019-10-24 NOTE — Patient Instructions (Signed)
If you are age 77 or older, your body mass index should be between 23-30. Your Body mass index is 37.12 kg/m. If this is out of the aforementioned range listed, please consider follow up with your Primary Care Provider.  If you are age 37 or younger, your body mass index should be between 19-25. Your Body mass index is 37.12 kg/m. If this is out of the aformentioned range listed, please consider follow up with your Primary Care Provider.   You will be due for a recall colonoscopy in 04/2020. We will send you a reminder in the mail when it gets closer to that time  It was a pleasure to see you today!  Vito Cirigliano, D.O.

## 2019-10-24 NOTE — Progress Notes (Signed)
P  Chief Complaint:    Hospital follow-up, adenomatous polyps  GI History: 79 year old male with a history of pneumonia, depression, obesity (BMI 38.9), bipolar disorder, presenting for colonoscopy with EMR. 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  The larger polyp was biopsied, without any high-grade dysplasia or malignancy.  Discussed resection options with the patient at length, to include surgical resection with right hemicolectomy versus colonoscopy with Endoscopic mucosal resection (EMR), and he would like to proceed with EMR.  Subsequent CT abdomen/pelvis otherwise unremarkable, without any lymphadenopathy.  Mildly enlarged peripancreatic/portacaval nodes (1.4 cm), unchanged in 2013 and likely reactive.  Separately, longstanding history of GERD. Index sxs of hoarseness,chronic cough, regurgitation, increased belching. No HB. Worse with supine, spicy, tomato based sauces, fried foods. Sxs worse over the last 3-4 months or so.Was previously treated with omeprazole, but more recently changed to Protonix. Sleeps with head elevated.  Chronic cough and dysphagia which occurs immediately after swallowing.  EGD with dilation last month.  Has been evaluated by Dr. Valeta Harms in the [facility] for chronic cough in 09/2018, with improvement after 2-week trial of Protonix 40 mg bid.   Endoscopic history: -Colonoscopy (08/2019, Dr. 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 Bryan Lemma): 3 cm HH, irregular Z-line (biopsy: Reflux changes without intestinal metaplasia), Gastric hyperplastic polyps.  Empiric dilation with 51 Fr Savary -CT abdomen/pelvis (09/30/2019): No discrete mass.  No lymphadenopathy. -Colonoscopy (10/02/2019, Dr.  Bryan Lemma): EMR of 25 mm polyp in ascending colon (TVA), left-sided diverticulosis, internal hemorrhoids -Admitted January 19-21, 2021 with post polypectomy bleed.  Hemodynamically stable, stable hemoglobin.  No blood products needed -Colonoscopy (10/09/2019, Dr. Carlean Purl): 20 mm ulcer in proximal ascending colon without active bleeding but positive stigmata.  Treated with 11 clips.  Difficult positioning and significant respiratory-dependent motion in colon.  HPI:     Patient is a 79 y.o. male presenting [facility] for hospital follow-up.  Was admitted January 19-21 for post polypectomy bleed, treated with colonoscopy with clipping on 10/09/19 by Dr. Carlean Purl.  Remained hemodynamically stable, and hemoglobin stayed relatively stable throughout hospital course, with nadir 11.9.  No blood products required.  Otherwise did well clinically, and discharged home on 1/21 with continued ASA 81 mg.  Hemoglobin 12.0 at discharge.  Since hospital discharge, has been feeling well.  No recurrence of hematochezia.  Tolerating all p.o. intake. Some soreness in right abdomen, but not frank pain. No f/c/n/v.   Review of systems:     No chest pain, no SOB, no fevers, no urinary sx   Past Medical History:  Diagnosis Date  . Anal fissure   . Anxiety   . Arthritis    right wrist; pt. states "everywhere"  . Asthma   . Bipolar disorder ([place])   . Cataract   . Depression   . GERD (gastroesophageal reflux disease)   . Hemorrhage of colon following colonoscopy 10/08/2019  . History of kidney stones   . 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 Hyperlipidemia   . Hypertension    states under control with med., has been on med. x 1 yr.  . Impaired hearing   . Left bundle branch block (LBBB) on electrocardiogram 10/29/2015  . Macular degeneration (senile) of retina  left  . Wears glasses     Patient's surgical history, family medical history, social history, medications  and allergies were all reviewed in Epic    Current Outpatient Medications  Medication Sig Dispense Refill  . albuterol (VENTOLIN HFA) 108 (90 Base) MCG/ACT inhaler Inhale 2 puffs into the lungs every 6 (six) hours as needed for wheezing or shortness of breath. 6.7 g 1  . Alum Hydroxide-Mag Carbonate (GAVISCON PO) Take 1 tablet by mouth 3 (three) times daily as needed (heartburn).     . ARIPiprazole ER (ABILIFY MAINTENA) 400 MG PRSY prefilled syringe Inject 400 mg into the muscle every 28 (twenty-eight) days. 1 each 2  . aspirin 81 MG tablet Take 1 tablet (81 mg total) by mouth daily. Do not take again until 10/24/2019 30 tablet   . atorvastatin (LIPITOR) 40 MG tablet Take 1 tablet (40 mg total) by mouth daily at 6 PM.    . benztropine (COGENTIN) 1 MG tablet Take 1 tablet (1 mg total) by mouth daily. 90 tablet 0  . citalopram (CELEXA) 20 MG tablet Take 1 tablet (20 mg total) by mouth daily. 90 tablet 0  . fenofibrate (TRICOR) 145 MG tablet Take 1 tablet (145 mg total) by mouth daily. 90 tablet 3  . lisinopril (ZESTRIL) 20 MG tablet TAKE 1 TABLET BY MOUTH EVERY DAY 90 tablet 3  . Multiple Vitamin (MULTIVITAMIN WITH MINERALS) TABS tablet Take 1 tablet by mouth daily.    . Multiple Vitamins-Minerals (PRESERVISION AREDS 2 PO) Take 1 capsule by mouth 2 (two) times daily.    . Omega-3 Fatty Acids (FISH OIL PO) Take 1 capsule by mouth daily.     Marland Kitchen oxymetazoline (AFRIN) 0.05 % nasal spray Place 1 spray into both nostrils 2 (two) times daily as needed for congestion.    . pantoprazole (PROTONIX) 40 MG tablet Take 1 tablet (40 mg total) by mouth 2 (two) times daily. 180 tablet 3  . temazepam (RESTORIL) 30 MG capsule Take 1 capsule (30 mg total) by mouth at bedtime as needed. for sleep (Patient taking differently: Take 30 mg by mouth at bedtime. ) 90 capsule 0  . traZODone (DESYREL) 50 MG tablet Take 1 tablet (50 mg total) by mouth at bedtime. 90 tablet 0  . Vitamin D, Cholecalciferol, 1000 UNITS TABS Take  1,000 Units by mouth daily.      Current Facility-Administered Medications  Medication Dose Route Frequency Provider Last Rate Last Admin  . ARIPiprazole ER (ABILIFY MAINTENA) 400 MG prefilled syringe 400 mg  400 mg Intramuscular Q28 days Arfeen, Arlyce Harman, MD   400 mg at 10/21/19 1119    Physical Exam:     BP 130/70   Pulse 82   Temp (!) 97.1 F (36.2 C)   Ht 5' 9.5" (1.765 m)   Wt 255 lb (115.7 kg)   BMI 37.12 kg/m   GENERAL:  Pleasant male in NAD PSYCH: : Cooperative, normal affect EENT:  conjunctiva pink, mucous membranes moist, neck supple without masses CARDIAC:  RRR, no murmur heard, no peripheral edema PULM: Normal respiratory effort, lungs CTA bilaterally, no wheezing ABDOMEN:  Mild discomfort in RLQ without rebound or guarding. no peritoneal signs. Nondistended, soft. 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 Can look to titrate to Protonix 40 mg/day in the near future and continue titrating to lowest effective dose - No dysphagia since Rx change and EGD with dilation. Tolerating PO - Resume antireflux dietary/lifestyle modifications - Has been intentionally losing weight through diet. Continued weight loss certainly correlates well with improved reflux control  The indications, risks, and benefits of colonoscopy were explained to the patient and his wife in  detail. Risks include but are not limited to bleeding, perforation, adverse reaction to medications, and cardiopulmonary compromise. Sequelae include but are not limited to the possibility of surgery, hospitalization, and mortality. The patient verbalized understanding and wished to proceed. All questions answered, referred to the scheduler and bowel prep ordered. Further recommendations pending results of the exam.           Dominic Pea Kathya Wilz ,DO, FACG 10/24/2019, 9:30 AM

## 2019-10-25 ENCOUNTER — Ambulatory Visit: Payer: Medicare HMO | Attending: Internal Medicine

## 2019-10-25 DIAGNOSIS — Z23 Encounter for immunization: Secondary | ICD-10-CM

## 2019-10-25 NOTE — Progress Notes (Signed)
   Covid-19 Vaccination Clinic  Name:  Sean Hill    MRN: [id number] DOB: Apr 02, 1941  10/25/2019  [name] was observed post Covid-19 immunization for 15 minutes without incidence. He was provided with Vaccine Information Sheet and instruction to access the V-Safe system.   [name] was instructed to call 911 with any severe reactions post vaccine: Marland Kitchen Difficulty breathing  . Swelling of your face and throat  . A fast heartbeat  . A bad rash all over your body  . Dizziness and weakness    Immunizations Administered    Name Date Dose VIS Date Route   Pfizer COVID-19 Vaccine 10/25/2019  1:29 PM 0.3 mL 08/30/2019 Intramuscular   Manufacturer: [place]   Lot: [id number]   [place]: [id number]

## 2019-10-29 DIAGNOSIS — R69 Illness, unspecified: Secondary | ICD-10-CM | POA: Diagnosis not present

## 2019-10-29 DIAGNOSIS — F411 Generalized anxiety disorder: Secondary | ICD-10-CM | POA: Diagnosis not present

## 2019-11-02 ENCOUNTER — Ambulatory Visit: Payer: Medicare HMO

## 2019-11-05 ENCOUNTER — Telehealth (HOSPITAL_COMMUNITY): Payer: Self-pay | Admitting: *Deleted

## 2019-11-05 NOTE — Telephone Encounter (Signed)
PA for Benztropine Mesylate approved through 09/18/20 per Holland Falling, [facility].

## 2019-11-05 NOTE — Telephone Encounter (Signed)
Thanks for update

## 2019-11-13 ENCOUNTER — Other Ambulatory Visit: Payer: Self-pay | Admitting: Adult Health

## 2019-11-13 DIAGNOSIS — E785 Hyperlipidemia, unspecified: Secondary | ICD-10-CM

## 2019-11-13 NOTE — Telephone Encounter (Signed)
Pt scheduled for cpx.  Filled for 90 days.  Nothing further needed.

## 2019-11-19 ENCOUNTER — Ambulatory Visit: Payer: Medicare HMO | Attending: Internal Medicine

## 2019-11-19 DIAGNOSIS — F411 Generalized anxiety disorder: Secondary | ICD-10-CM | POA: Diagnosis not present

## 2019-11-19 DIAGNOSIS — R69 Illness, unspecified: Secondary | ICD-10-CM | POA: Diagnosis not present

## 2019-11-19 DIAGNOSIS — Z23 Encounter for immunization: Secondary | ICD-10-CM | POA: Insufficient documentation

## 2019-11-19 NOTE — Progress Notes (Signed)
   Covid-19 Vaccination Clinic  Name:  Sean Hill    MRN: [id number] DOB: [birth year]  11/19/2019  Sean Hill was observed post Covid-19 immunization for 15 minutes without incident. He was provided with Vaccine Information Sheet and instruction to access the V-Safe system.   Sean Hill was instructed to call 911 with any severe reactions post vaccine: Marland Kitchen Difficulty breathing  . Swelling of face and throat  . A fast heartbeat  . A bad rash all over body  . Dizziness and weakness   Immunizations Administered    Name Date Dose VIS Date Route   Pfizer COVID-19 Vaccine 11/19/2019  8:31 AM 0.3 mL 08/30/2019 Intramuscular   Manufacturer: [place]   Lot: [id number]   [place]: [id number]

## 2019-11-20 ENCOUNTER — Ambulatory Visit: Payer: Medicare HMO | Admitting: Gastroenterology

## 2019-11-22 DIAGNOSIS — H353232 Exudative age-related macular degeneration, bilateral, with inactive choroidal neovascularization: Secondary | ICD-10-CM | POA: Diagnosis not present

## 2019-11-25 ENCOUNTER — Encounter (HOSPITAL_COMMUNITY): Payer: Self-pay

## 2019-11-25 ENCOUNTER — Ambulatory Visit (INDEPENDENT_AMBULATORY_CARE_PROVIDER_SITE_OTHER): Payer: Medicare HMO

## 2019-11-25 ENCOUNTER — Other Ambulatory Visit: Payer: Self-pay

## 2019-11-25 VITALS — BP 137/77 | HR 75 | Temp 98.3°F | Ht 70.0 in | Wt 255.8 lb

## 2019-11-25 DIAGNOSIS — R69 Illness, unspecified: Secondary | ICD-10-CM | POA: Diagnosis not present

## 2019-11-25 DIAGNOSIS — F319 Bipolar disorder, unspecified: Secondary | ICD-10-CM | POA: Diagnosis not present

## 2019-11-25 NOTE — Progress Notes (Signed)
Pt presents today for due Abilify Maintena 400mg  injection. Pt is appropriate, pleasant and cooperative on approach. Pt denies any issues with mood, s.i., or avh. Injection given in right upper outer quadrant without compliant. Pt instructed to call clinic with any concerns, issues, prior to next monthly injection. Pt verbalizes understanding.

## 2019-12-17 ENCOUNTER — Other Ambulatory Visit (HOSPITAL_COMMUNITY): Payer: Self-pay | Admitting: Psychiatry

## 2019-12-17 DIAGNOSIS — F319 Bipolar disorder, unspecified: Secondary | ICD-10-CM

## 2019-12-17 DIAGNOSIS — F411 Generalized anxiety disorder: Secondary | ICD-10-CM

## 2019-12-18 ENCOUNTER — Encounter (HOSPITAL_COMMUNITY): Payer: Self-pay

## 2019-12-18 ENCOUNTER — Other Ambulatory Visit: Payer: Self-pay

## 2019-12-18 ENCOUNTER — Ambulatory Visit (HOSPITAL_COMMUNITY): Payer: Medicare HMO

## 2019-12-18 DIAGNOSIS — F319 Bipolar disorder, unspecified: Secondary | ICD-10-CM

## 2019-12-18 NOTE — Progress Notes (Signed)
Patient presents today with flat affect and mood for his monthly injection.Patient's Abilify Maintena 400 mg IM injectionwasprepared as ordered and given to patient in hisleft upper outer quaderant.Patient tolerated injection without any complaint of pain or discomfort and agreed to return in 4 weeks for next due injection. Patient denied any suicidal or homicidal ideations, no auditory or visual hallucinations and no plan or intent to harm self or others. Patient scheduled next return injection visit for the coming month and will call if any problems prior till then

## 2019-12-24 ENCOUNTER — Encounter (HOSPITAL_COMMUNITY): Payer: Self-pay | Admitting: Psychiatry

## 2019-12-24 ENCOUNTER — Other Ambulatory Visit: Payer: Self-pay

## 2019-12-24 ENCOUNTER — Ambulatory Visit (INDEPENDENT_AMBULATORY_CARE_PROVIDER_SITE_OTHER): Payer: Medicare HMO | Admitting: Psychiatry

## 2019-12-24 DIAGNOSIS — F319 Bipolar disorder, unspecified: Secondary | ICD-10-CM

## 2019-12-24 DIAGNOSIS — R69 Illness, unspecified: Secondary | ICD-10-CM | POA: Diagnosis not present

## 2019-12-24 DIAGNOSIS — F411 Generalized anxiety disorder: Secondary | ICD-10-CM

## 2019-12-24 MED ORDER — ARIPIPRAZOLE ER 400 MG IM PRSY: 400.0000 mg | PREFILLED_SYRINGE | INTRAMUSCULAR | 2 refills | Status: DC

## 2019-12-24 MED ORDER — BENZTROPINE MESYLATE 1 MG PO TABS: 1.0000 mg | ORAL_TABLET | Freq: Every day | ORAL | 0 refills | Status: DC

## 2019-12-24 MED ORDER — TEMAZEPAM 30 MG PO CAPS: 30.0000 mg | ORAL_CAPSULE | Freq: Every day | ORAL | 0 refills | Status: DC

## 2019-12-24 MED ORDER — TRAZODONE HCL 50 MG PO TABS: 50.0000 mg | ORAL_TABLET | Freq: Every day | ORAL | 0 refills | Status: DC

## 2019-12-24 MED ORDER — CITALOPRAM HYDROBROMIDE 20 MG PO TABS: 20.0000 mg | ORAL_TABLET | Freq: Every day | ORAL | 0 refills | Status: DC

## 2019-12-24 NOTE — Progress Notes (Signed)
Virtual Visit via Telephone Note  I connected with Sean Hill on 12/24/19 at  9:00 AM EDT by telephone and verified that I am speaking with the correct person using two identifiers.   I discussed the limitations, risks, security and privacy concerns of performing an evaluation and management service by telephone and the availability of in person appointments. I also discussed with the patient that there may be a patient responsible charge related to this service. The patient expressed understanding and agreed to proceed.   History of Present Illness: Patient was evaluated by phone session.  Recently he had a colonoscopy and he was told malignant polyps were removed.  He is doing better.  Overall he reported his mood is stable.  He is sleeping good since he got approved Cogentin after prior authorization.  He is also taking trazodone and temazepam. He denies any irritability, mania, hallucination or any paranoia.  He has mild tremors but does not interfere in his daily activities.  He is pleased that he has no more polyps which could be cancerous.  He does not want to change medication.  He is getting Abilify injection which is helping his mood and irritability.  His appetite is okay.  His energy level is good.  He lives with his wife who is supportive.  Past Psychiatric History:Reviewed. H/Omultiple hospitalization. Last hospitalization in July 2014.H/Ooverdose onLatudawith alcohol.H/Ocutting wrist. Took Tegretol, Depakote, Ambien, Remeron, Vistaril, Geodon, Abilify, lithium, Provigil, Zoloft, Neurontin, Lamictal, Wellbutrin, Risperdal and Pristiq. H/Omania,aggression, getting speeding tickets, excessive buying and impulsive behavior.  Psychiatric Specialty Exam: Physical Exam  Review of Systems  There were no vitals taken for this visit.There is no height or weight on file to calculate BMI.  General Appearance: NA  Eye Contact:  NA  Speech:  Slow  Volume:  Normal  Mood:   Euthymic  Affect:  NA  Thought Process:  Goal Directed  Orientation:  Full (Time, Place, and Person)  Thought Content:  WDL  Suicidal Thoughts:  No  Homicidal Thoughts:  No  Memory:  Immediate;   Good Recent;   Good Remote;   Fair  Judgement:  Good  Insight:  Present  Psychomotor Activity:  NA  Concentration:  Concentration: Fair and Attention Span: Fair  Recall:  [employer] of Knowledge:  Good  Language:  Good  Akathisia:  No  Handed:  Right  AIMS (if indicated):     Assets:  Communication Skills Desire for Improvement Housing Resilience Social Support Transportation  ADL's:  Intact  Cognition:  WNL  Sleep:   ok      Assessment and Plan: Bipolar disorder type I.  Generalized anxiety disorder.  Patient is a stable on his current medication.  Recently had a colonoscopy and malignant polyps were removed.  He is less anxious and he feel the current medicine is working.  Continue Abilify injection 400 mg intramuscular every 4 weeks, Celexa 20 mg daily, temazepam 30 mg at bedtime Cogentin 1 mg at bedtime and trazodone 50 mg at bedtime.  Recommend to continue therapy with Sean Hill.  Discussed medication side effects and benefits.  Recommended to call us back if he has any question or any concern.  Follow-up in 3 months.  Follow Up Instructions:    I discussed the assessment and treatment plan with the patient. The patient was provided an opportunity to ask questions and all were answered. The patient agreed with the plan and demonstrated an understanding of the instructions.   The patient was  advised to call back or seek an in-person evaluation if the symptoms worsen or if the condition fails to improve as anticipated.  I provided 20 minutes of non-face-to-face time during this encounter.   Sean Nations, MD

## 2019-12-25 ENCOUNTER — Ambulatory Visit (INDEPENDENT_AMBULATORY_CARE_PROVIDER_SITE_OTHER): Payer: Medicare HMO | Admitting: Adult Health

## 2019-12-25 ENCOUNTER — Encounter: Payer: Self-pay | Admitting: Adult Health

## 2019-12-25 ENCOUNTER — Telehealth: Payer: Self-pay | Admitting: Adult Health

## 2019-12-25 ENCOUNTER — Other Ambulatory Visit: Payer: Self-pay

## 2019-12-25 VITALS — BP 130/76 | Temp 98.1°F | Ht 69.0 in | Wt 254.0 lb

## 2019-12-25 DIAGNOSIS — E7439 Other disorders of intestinal carbohydrate absorption: Secondary | ICD-10-CM

## 2019-12-25 DIAGNOSIS — R69 Illness, unspecified: Secondary | ICD-10-CM | POA: Diagnosis not present

## 2019-12-25 DIAGNOSIS — K2100 Gastro-esophageal reflux disease with esophagitis, without bleeding: Secondary | ICD-10-CM

## 2019-12-25 DIAGNOSIS — Z0000 Encounter for general adult medical examination without abnormal findings: Secondary | ICD-10-CM

## 2019-12-25 DIAGNOSIS — E7849 Other hyperlipidemia: Secondary | ICD-10-CM

## 2019-12-25 DIAGNOSIS — I10 Essential (primary) hypertension: Secondary | ICD-10-CM

## 2019-12-25 DIAGNOSIS — G4709 Other insomnia: Secondary | ICD-10-CM

## 2019-12-25 DIAGNOSIS — Z125 Encounter for screening for malignant neoplasm of prostate: Secondary | ICD-10-CM | POA: Diagnosis not present

## 2019-12-25 DIAGNOSIS — F3131 Bipolar disorder, current episode depressed, mild: Secondary | ICD-10-CM | POA: Diagnosis not present

## 2019-12-25 LAB — COMPREHENSIVE METABOLIC PANEL
ALT: 17 U/L (ref 0–53)
AST: 25 U/L (ref 0–37)
Albumin: 4.5 g/dL (ref 3.5–5.2)
Alkaline Phosphatase: 59 U/L (ref 39–117)
BUN: 18 mg/dL (ref 6–23)
CO2: 28 mEq/L (ref 19–32)
Calcium: 9.7 mg/dL (ref 8.4–10.5)
Chloride: 104 mEq/L (ref 96–112)
Creatinine, Ser: 1.19 mg/dL (ref 0.40–1.50)
GFR: 59.27 mL/min — ABNORMAL LOW (ref >60.00)
Glucose, Bld: 111 mg/dL — ABNORMAL HIGH (ref 70–99)
Potassium: 4.7 mEq/L (ref 3.5–5.1)
Sodium: 140 mEq/L (ref 135–145)
Total Bilirubin: 0.5 mg/dL (ref 0.2–1.2)
Total Protein: 6.7 g/dL (ref 6.0–8.3)

## 2019-12-25 LAB — LIPID PANEL
Cholesterol: 140 mg/dL (ref 0–200)
HDL: 32 mg/dL — ABNORMAL LOW (ref >39.00)
NonHDL: 108.19
Total CHOL/HDL Ratio: 4
Triglycerides: 265 mg/dL — ABNORMAL HIGH (ref 0.0–149.0)
VLDL: 53 mg/dL — ABNORMAL HIGH (ref 0.0–40.0)

## 2019-12-25 LAB — CBC WITH DIFFERENTIAL/PLATELET
Basophils Absolute: 0.1 10*3/uL (ref 0.0–0.1)
Basophils Relative: 1.2 % (ref 0.0–3.0)
Eosinophils Absolute: 0.1 10*3/uL (ref 0.0–0.7)
Eosinophils Relative: 1.8 % (ref 0.0–5.0)
HCT: 44.6 % (ref 39.0–52.0)
Hemoglobin: 14.9 g/dL (ref 13.0–17.0)
Lymphocytes Relative: 37.4 % (ref 12.0–46.0)
Lymphs Abs: 1.8 10*3/uL (ref 0.7–4.0)
MCHC: 33.5 g/dL (ref 30.0–36.0)
MCV: 85.6 fl (ref 78.0–100.0)
Monocytes Absolute: 0.4 10*3/uL (ref 0.1–1.0)
Monocytes Relative: 7.7 % (ref 3.0–12.0)
Neutro Abs: 2.5 10*3/uL (ref 1.4–7.7)
Neutrophils Relative %: 51.9 % (ref 43.0–77.0)
Platelets: 245 10*3/uL (ref 150.0–400.0)
RBC: 5.21 Mil/uL (ref 4.22–5.81)
RDW: 14.7 % (ref 11.5–15.5)
WBC: 4.9 10*3/uL (ref 4.0–10.5)

## 2019-12-25 LAB — HEMOGLOBIN A1C: Hgb A1c MFr Bld: 6 % (ref 4.6–6.5)

## 2019-12-25 LAB — PSA: PSA: 0.22 ng/mL (ref 0.10–4.00)

## 2019-12-25 LAB — LDL CHOLESTEROL, DIRECT: Direct LDL: 72 mg/dL

## 2019-12-25 LAB — TSH: TSH: 2.3 u[IU]/mL (ref 0.35–4.50)

## 2019-12-25 MED ORDER — EZETIMIBE 10 MG PO TABS: 10.0000 mg | ORAL_TABLET | Freq: Every day | ORAL | 3 refills | Status: DC

## 2019-12-25 MED ORDER — FENOFIBRATE 145 MG PO TABS: 145.0000 mg | ORAL_TABLET | Freq: Every day | ORAL | 3 refills | Status: DC

## 2019-12-25 NOTE — Patient Instructions (Signed)
It was great seeing you today   I will follow up with you regarding your blood work   I need you to start moving more. Get outside with [place] and start walking.   I will see you back in one year or sooner if needed

## 2019-12-25 NOTE — Progress Notes (Signed)
Subjective:    Patient ID: Sean Hill, male    DOB: 26-Jun-1941, 79 y.o.   MRN: [id number]  HPI Patient presents for yearly preventative medicine examination. He is a pleasant 79 year old male who  has a past medical history of Anal fissure, Anxiety, Arthritis, Asthma, Bipolar disorder ([place]), Cataract, Depression, GERD (gastroesophageal reflux disease), Hemorrhage of colon following colonoscopy (10/08/2019), History of kidney stones, History of MRSA infection, Hyperlipidemia, Hypertension, Impaired hearing, Left bundle branch block (LBBB) on electrocardiogram (10/29/2015), Macular degeneration (senile) of retina, and Wears glasses.  Hyperlipidemia -currently prescribed Lipitor 40 mg, TriCor 145 mg, and baby aspirin.  He denies myalgia or fatigue. Lab Results  Component Value Date   CHOL 116 11/07/2018   HDL 29.20 (L) 11/07/2018   LDLDIRECT 65.0 11/07/2018   TRIG 252.0 (H) 11/07/2018   CHOLHDL 4 11/07/2018   Hypertension -controlled with lisinopril 20 mg daily.  He denies dizziness, lightheadedness, chest pain, or shortness of breath BP Readings from Last 3 Encounters:  12/25/19 130/76  10/24/19 130/70  10/10/19 (!) 123/55   GERD - takes Prilosec 40 mg BID. He feels controlled on this medication   H/o Depression and Bipolar Disorder -he is currently seen by psychiatry and is maintained on Cogentin, Lexapro, trazodone, and Abilify injections.  He denies hallucinations, suicidal ideation, irritability, or depression.  Insomnia -managed by psychiatry.  Currently prescribed Restoril 30 mg and trazodone 50 mg nightly.  Mass of Colon -he had a positive Cologuard in 2016 but refused colonoscopies in the past.  He was having some rectal bleeding and was referred to GI who found a colon mass as a sending colon.  Thankfully pathology reports came back benign.  Glucose Intolerance  Lab Results  Component Value Date   HGBA1C 6.1 11/07/2018    All immunizations and health maintenance  protocols were reviewed with the patient and needed orders were placed.He is up to date on routine vaccinations   Appropriate screening laboratory values were ordered for the patient including screening of hyperlipidemia, renal function and hepatic function. If indicated by BPH, a PSA was ordered.  Medication reconciliation,  past medical history, social history, problem list and allergies were reviewed in detail with the patient  Goals were established with regard to weight loss, exercise, and  diet in compliance with medications. He is not exercising but has been trying to watch what he eats and has cut back on snacking.   Wt Readings from Last 3 Encounters:  12/25/19 254 lb (115.2 kg)  10/24/19 255 lb (115.7 kg)  10/08/19 255 lb (115.7 kg)    Review of Systems  Constitutional: Negative.   HENT: Positive for hearing loss.   Eyes: Negative.   Respiratory: Negative.   Cardiovascular: Negative.   Gastrointestinal: Positive for constipation.  Endocrine: Negative.   Genitourinary: Negative.   Musculoskeletal: Positive for arthralgias and back pain.  Skin: Negative.   Allergic/Immunologic: Negative.   Neurological: Negative.   Hematological: Negative.   Psychiatric/Behavioral: Negative.   All other systems reviewed and are negative.  Past Medical History:  Diagnosis Date   Anal fissure    Anxiety    Arthritis    right wrist; pt. states "everywhere"   Asthma    Bipolar disorder ([place])    Cataract    Depression    GERD (gastroesophageal reflux disease)    Hemorrhage of colon following colonoscopy 10/08/2019   History of kidney stones    History of MRSA infection    left knee after  arthroplasty   Hyperlipidemia    Hypertension    states under control with med., has been on med. x 1 yr.   Impaired hearing    Left bundle branch block (LBBB) on electrocardiogram 10/29/2015   Macular degeneration (senile) of retina    left   Wears glasses     Social  History   Socioeconomic History   Marital status: Married    Spouse name: Not on file   Number of children: Not on file   Years of education: Not on file   Highest education level: Not on file  Occupational History   Not on file  Tobacco Use   Smoking status: Never Smoker   Smokeless tobacco: Never Used  Substance and Sexual Activity   Alcohol use: Not Currently   Drug use: No   Sexual activity: Not Currently    Birth control/protection: None  Other Topics Concern   Not on file  Social History Narrative   Married, lives in [place].  Retired Dance movement psychotherapist.   Two daughters, both married,3 grandchildren ( 60, 68, 1 year)    No regular exercise.  Never smoker.  No ETOH.  No drugs.   Social Determinants of Health   Financial Resource Strain:    Difficulty of Paying Living Expenses:   Food Insecurity:    Worried About Charity fundraiser in the Last Year:    Arboriculturist in the Last Year:   Transportation Needs:    Film/video editor (Medical):    Lack of Transportation (Non-Medical):   Physical Activity:    Days of Exercise per Week:    Minutes of Exercise per Session:   Stress:    Feeling of Stress :   Social Connections:    Frequency of Communication with Friends and Family:    Frequency of Social Gatherings with Friends and Family:    Attends Religious Services:    Active Member of Clubs or Organizations:    Attends Music therapist:    Marital Status:   Intimate Partner Violence:    Fear of Current or Ex-Partner:    Emotionally Abused:    Physically Abused:    Sexually Abused:     Past Surgical History:  Procedure Laterality Date   ANKLE FUSION Bilateral    ANKLE SURGERY Bilateral    ligament surgery   CARDIAC CATHETERIZATION  x 2   1983; 11/14/2003   CARPOMETACARPEL SUSPENSION PLASTY Right 09/22/2016   Procedure: right thumb carpometacarpal  ARTHROPLASTY;  Surgeon: Leanora Cover, MD;  Location: [place] [facility];  Service: Orthopedics;  Laterality: Right;  right thumb carpometacarpal  ARTHROPLASTY   CARPOMETACARPEL SUSPENSION PLASTY Right 11/02/2017   Procedure: RIGHT THUMB SUSPENSIONPLASTY WITH TIGHTROPE, DISTAL POLE SCAPHOID  EXCISION;  Surgeon: Leanora Cover, MD;  Location: [place];  Service: Orthopedics;  Laterality: Right;   COLONOSCOPY WITH PROPOFOL N/A 10/02/2019   Procedure: COLONOSCOPY WITH PROPOFOL;  Surgeon: Lavena Bullion, DO;  Location: [facility];  Service: Gastroenterology;  Laterality: N/A;   COLONOSCOPY WITH PROPOFOL N/A 10/09/2019   Procedure: COLONOSCOPY WITH PROPOFOL;  Surgeon: Gatha Mayer, MD;  Location: [facility];  Service: Endoscopy;  Laterality: N/A;   ELBOW SURGERY Left    ENDOSCOPIC MUCOSAL RESECTION N/A 10/02/2019   Procedure: ENDOSCOPIC MUCOSAL RESECTION;  Surgeon: Lavena Bullion, DO;  Location: [facility];  Service: Gastroenterology;  Laterality: N/A;   HEMOSTASIS CLIP PLACEMENT  10/09/2019   Procedure: HEMOSTASIS CLIP PLACEMENT;  Surgeon:  Gatha Mayer, MD;  Location: [facility];  Service: Endoscopy;;   INGUINAL HERNIA REPAIR Right    OPEN REDUCTION INTERNAL FIXATION (ORIF) DISTAL RADIAL FRACTURE Right 10/29/2015   Procedure: OPEN REDUCTION INTERNAL FIXATION (ORIF) DISTAL RADIAL FRACTURE;  Surgeon: Leanora Cover, MD;  Location: [place];  Service: Orthopedics;  Laterality: Right;   ORIF ELBOW FRACTURE Right    SUBMUCOSAL LIFTING INJECTION  10/02/2019   Procedure: SUBMUCOSAL LIFTING INJECTION;  Surgeon: Lavena Bullion, DO;  Location: [facility];  Service: Gastroenterology;;   TONSILLECTOMY     TOTAL KNEE ARTHROPLASTY Bilateral    ULNAR COLLATERAL LIGAMENT REPAIR Right 12/29/2015   Procedure: REPAIR  RIGHT LATERAL ULNAR COLLATERAL LIGAMENT TEAR  EXTENSOR ORIGIN ;  Surgeon: Leanora Cover, MD;  Location: [place];  Service: Orthopedics;  Laterality: Right;   WRIST ARTHROSCOPY WITH  DEBRIDEMENT Right 07/14/2016   Procedure: RIGHT WRIST ARTHROSCOPY WITH DEBRIDEMENT  TRIANGULAR FIBROCARTILAGE COMPLEX;  Surgeon: Leanora Cover, MD;  Location: [place];  Service: Orthopedics;  Laterality: Right;    Family History  Problem Relation Age of Onset   Alcohol abuse Mother    Ovarian cancer Mother    Alcohol abuse Father    Pancreatic cancer Father    Depression Daughter    Paranoid behavior Daughter    Hyperlipidemia Brother    Barrett's esophagus Brother    Colon cancer Maternal Grandmother    Esophageal cancer Neg Hx    Stomach cancer Neg Hx    Rectal cancer Neg Hx     Allergies  Allergen Reactions   Penicillins Other (See Comments)    BLISTERS Did it involve swelling of the face/tongue/throat, SOB, or low BP? No Did it involve sudden or severe rash/hives, skin peeling, or any reaction on the inside of your mouth or nose? No Did you need to seek medical attention at a hospital or doctor's office? No When did it last happen?around 1980 If all above answers are NO, may proceed with cephalosporin use.    Codeine Rash    Current Outpatient Medications on File Prior to Visit  Medication Sig Dispense Refill   albuterol (VENTOLIN HFA) 108 (90 Base) MCG/ACT inhaler Inhale 2 puffs into the lungs every 6 (six) hours as needed for wheezing or shortness of breath. 6.7 g 1   Alum Hydroxide-Mag Carbonate (GAVISCON PO) Take 1 tablet by mouth 3 (three) times daily as needed (heartburn).      ARIPiprazole ER (ABILIFY MAINTENA) 400 MG PRSY prefilled syringe Inject 400 mg into the muscle every 28 (twenty-eight) days. 1 each 2   aspirin 81 MG tablet Take 1 tablet (81 mg total) by mouth daily. Do not take again until 10/24/2019 30 tablet    atorvastatin (LIPITOR) 40 MG tablet TAKE 1 TABLET BY MOUTH EVERY DAY 90 tablet 0   benztropine (COGENTIN) 1 MG tablet Take 1 tablet (1 mg total) by mouth daily. 90 tablet 0   citalopram (CELEXA) 20 MG tablet  Take 1 tablet (20 mg total) by mouth daily. 90 tablet 0   fenofibrate (TRICOR) 145 MG tablet Take 1 tablet (145 mg total) by mouth daily. 90 tablet 3   lisinopril (ZESTRIL) 20 MG tablet TAKE 1 TABLET BY MOUTH EVERY DAY 90 tablet 3   Multiple Vitamin (MULTIVITAMIN WITH MINERALS) TABS tablet Take 1 tablet by mouth daily.     Multiple Vitamins-Minerals (PRESERVISION AREDS 2 PO) Take 1 capsule by mouth 2 (two) times daily.     Omega-3  Fatty Acids (FISH OIL PO) Take 1 capsule by mouth daily.      oxymetazoline (AFRIN) 0.05 % nasal spray Place 1 spray into both nostrils 2 (two) times daily as needed for congestion.     pantoprazole (PROTONIX) 40 MG tablet Take 1 tablet (40 mg total) by mouth 2 (two) times daily. 180 tablet 3   temazepam (RESTORIL) 30 MG capsule Take 1 capsule (30 mg total) by mouth at bedtime. 90 capsule 0   traZODone (DESYREL) 50 MG tablet Take 1 tablet (50 mg total) by mouth at bedtime. 90 tablet 0   Vitamin D, Cholecalciferol, 1000 UNITS TABS Take 1,000 Units by mouth daily.      Current Facility-Administered Medications on File Prior to Visit  Medication Dose Route Frequency Provider Last Rate Last Admin   ARIPiprazole ER (ABILIFY MAINTENA) 400 MG prefilled syringe 400 mg  400 mg Intramuscular Q28 days Arfeen, Arlyce Harman, MD   400 mg at 12/18/19 1358    BP 130/76    Temp 98.1 F (36.7 C)    Ht 5\' 9"  (1.753 m)    Wt 254 lb (115.2 kg)    BMI 37.51 kg/m       Objective:   Physical Exam Vitals and nursing note reviewed.  Constitutional:      General: He is not in acute distress.    Appearance: Normal appearance. He is well-developed. He is obese.  HENT:     Head: Normocephalic and atraumatic.     Right Ear: Tympanic membrane, ear canal and external ear normal. There is no impacted cerumen.     Left Ear: Tympanic membrane, ear canal and external ear normal. There is no impacted cerumen.     Ears:     Comments: Hearing aids present     Nose: Nose normal. No  congestion or rhinorrhea.     Mouth/Throat:     Mouth: Mucous membranes are dry.     Pharynx: Oropharynx is clear. No oropharyngeal exudate or posterior oropharyngeal erythema.  Eyes:     General:        Right eye: No discharge.        Left eye: No discharge.     Extraocular Movements: Extraocular movements intact.     Conjunctiva/sclera: Conjunctivae normal.     Pupils: Pupils are equal, round, and reactive to light.  Neck:     Vascular: No carotid bruit.     Trachea: No tracheal deviation.  Cardiovascular:     Rate and Rhythm: Normal rate and regular rhythm.     Pulses: Normal pulses.     Heart sounds: Normal heart sounds. No murmur. No friction rub. No gallop.   Pulmonary:     Effort: Pulmonary effort is normal. No respiratory distress.     Breath sounds: Normal breath sounds. No stridor. No wheezing, rhonchi or rales.  Chest:     Chest wall: No tenderness.  Abdominal:     General: Bowel sounds are normal. There is no distension.     Palpations: Abdomen is soft. There is no mass.     Tenderness: There is no abdominal tenderness. There is no right CVA tenderness, left CVA tenderness, guarding or rebound.     Hernia: No hernia is present.  Musculoskeletal:        General: No swelling, tenderness, deformity or signs of injury. Normal range of motion.     Right lower leg: No edema.     Left lower leg: No edema.  Lymphadenopathy:  Cervical: No cervical adenopathy.  Skin:    General: Skin is warm and dry.     Capillary Refill: Capillary refill takes less than 2 seconds.     Coloration: Skin is not jaundiced or pale.     Findings: No bruising, erythema, lesion or rash.  Neurological:     General: No focal deficit present.     Mental Status: He is alert and oriented to person, place, and time.     Cranial Nerves: No cranial nerve deficit.     Sensory: No sensory deficit.     Motor: No weakness.     Coordination: Coordination normal.     Gait: Gait normal.     Deep Tendon  Reflexes: Reflexes normal.  Psychiatric:        Mood and Affect: Mood normal.        Behavior: Behavior normal.        Thought Content: Thought content normal.        Judgment: Judgment normal.       Assessment & Plan:  1. Routine general medical examination at a health care facility - Needs to start exercising on a routine basis. Continue to work on diet  - Follow up in one year or sooner if needed - CBC with Differential/Platelet - Comprehensive metabolic panel - Lipid panel - TSH - Hemoglobin A1c  2. Gastroesophageal reflux disease with esophagitis without hemorrhage - Continue with PPI  - CBC with Differential/Platelet - Comprehensive metabolic panel - Lipid panel - TSH - Hemoglobin A1c  3. Essential hypertension - Good control. No change in medications  - CBC with Differential/Platelet - Comprehensive metabolic panel - Lipid panel - TSH - Hemoglobin A1c  4. Bipolar affective disorder, currently depressed, mild ([place]) - Continue with plan of care by psychiatry   5. Other hyperlipidemia - Consider adding Zetia and/or increasing lipitor  - CBC with Differential/Platelet - Comprehensive metabolic panel - Lipid panel - TSH - Hemoglobin A1c - fenofibrate (TRICOR) 145 MG tablet; Take 1 tablet (145 mg total) by mouth daily.  Dispense: 90 tablet; Refill: 3  6. Prostate cancer screening  - PSA  7. Other insomnia - Continue with Restoril and Trazodone   8. Glucose intolerance - Consider Metformin  - CBC with Differential/Platelet - Comprehensive metabolic panel - Lipid panel - TSH - Hemoglobin A1c   [name], NP

## 2019-12-25 NOTE — Telephone Encounter (Signed)
Updated patient on his labs. Triglycerides continue to be elevated. We will try starting Zetia

## 2020-01-03 DIAGNOSIS — F411 Generalized anxiety disorder: Secondary | ICD-10-CM | POA: Diagnosis not present

## 2020-01-03 DIAGNOSIS — R69 Illness, unspecified: Secondary | ICD-10-CM | POA: Diagnosis not present

## 2020-01-20 ENCOUNTER — Ambulatory Visit (INDEPENDENT_AMBULATORY_CARE_PROVIDER_SITE_OTHER): Payer: Medicare HMO | Admitting: *Deleted

## 2020-01-20 ENCOUNTER — Other Ambulatory Visit: Payer: Self-pay

## 2020-01-20 VITALS — BP 152/72 | HR 72 | Ht 69.0 in | Wt 261.5 lb

## 2020-01-20 DIAGNOSIS — F319 Bipolar disorder, unspecified: Secondary | ICD-10-CM | POA: Diagnosis not present

## 2020-01-20 DIAGNOSIS — R69 Illness, unspecified: Secondary | ICD-10-CM | POA: Diagnosis not present

## 2020-01-20 NOTE — Progress Notes (Signed)
Pt presents today for due Abilify Maintena 400mg  injection. Pt affect and mood appropriate. Pt is cooperative on approach. Injection given in left upper outer quadrant without any c/o. Pt says that he has not had any mental health issues since last injection. Pt denies s.i., h.i. or avh. Sean Hill is to return in approximately one month for next due injection.

## 2020-01-24 DIAGNOSIS — H353232 Exudative age-related macular degeneration, bilateral, with inactive choroidal neovascularization: Secondary | ICD-10-CM | POA: Diagnosis not present

## 2020-02-18 ENCOUNTER — Other Ambulatory Visit: Payer: Self-pay

## 2020-02-18 ENCOUNTER — Encounter (HOSPITAL_COMMUNITY): Payer: Self-pay | Admitting: *Deleted

## 2020-02-18 ENCOUNTER — Ambulatory Visit (INDEPENDENT_AMBULATORY_CARE_PROVIDER_SITE_OTHER): Payer: Medicare HMO | Admitting: *Deleted

## 2020-02-18 VITALS — BP 148/79 | HR 77 | Wt 262.0 lb

## 2020-02-18 DIAGNOSIS — R69 Illness, unspecified: Secondary | ICD-10-CM | POA: Diagnosis not present

## 2020-02-18 DIAGNOSIS — F319 Bipolar disorder, unspecified: Secondary | ICD-10-CM

## 2020-02-18 DIAGNOSIS — F3170 Bipolar disorder, currently in remission, most recent episode unspecified: Secondary | ICD-10-CM

## 2020-02-18 NOTE — Progress Notes (Signed)
Pt in clinic today for due Abilify Maintena 400 mg injection. Pt appropriate and cooperative on approach. Pt states he has been stable in mood since last injection. Vss. Injection given in right upper outer quadrant without c/o. Pt to return for next due injection in approximately 4 weeks.

## 2020-02-21 ENCOUNTER — Telehealth (INDEPENDENT_AMBULATORY_CARE_PROVIDER_SITE_OTHER): Payer: Medicare HMO | Admitting: Adult Health

## 2020-02-21 ENCOUNTER — Encounter: Payer: Self-pay | Admitting: Adult Health

## 2020-02-21 VITALS — BP 146/74 | Wt 258.0 lb

## 2020-02-21 DIAGNOSIS — J0100 Acute maxillary sinusitis, unspecified: Secondary | ICD-10-CM | POA: Diagnosis not present

## 2020-02-21 MED ORDER — DOXYCYCLINE HYCLATE 100 MG PO CAPS: 100.0000 mg | ORAL_CAPSULE | Freq: Two times a day (BID) | ORAL | 0 refills | Status: DC

## 2020-02-21 NOTE — Progress Notes (Signed)
Virtual Visit via Video Note  I connected with [name] on 02/21/20 at  4:00 PM EDT by a video enabled telemedicine application and verified that I am speaking with the correct person using two identifiers.  Location patient: home Location provider:work or home office Persons participating in the virtual visit: patient, provider  I discussed the limitations of evaluation and management by telemedicine and the availability of in person appointments. The patient expressed understanding and agreed to proceed.   HPI: 79 year old male who is being evaluated today for an acute issue.  Symptoms include right-sided maxillary sinus pain and pressure, nasal congestion, headache, tooth pain.  He does have seasonal allergies and has been taking over-the-counter medication throughout the spring.  Reports that this feels different.  He believes his symptoms started approximately 7 days ago but over the last 2 to 3 days have symptoms have been getting worse.  Denies fevers or chills.   ROS: See pertinent positives and negatives per HPI.  Past Medical History:  Diagnosis Date  . Anal fissure   . Anxiety   . Arthritis    right wrist; pt. states "everywhere"  . Asthma   . Bipolar disorder ([place])   . Cataract   . Depression   . GERD (gastroesophageal reflux disease)   . Hemorrhage of colon following colonoscopy 10/08/2019  . History of kidney stones   . 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 Hyperlipidemia   . Hypertension    states under control with med., has been on med. x 1 yr.  . Impaired hearing   . Left bundle branch block (LBBB) on electrocardiogram 10/29/2015  . Macular degeneration (senile) of retina    left  . Wears glasses     Past Surgical History:  Procedure Laterality Date  . ANKLE FUSION Bilateral   . ANKLE SURGERY Bilateral    ligament surgery  . CARDIAC CATHETERIZATION  x 2   1983; 11/14/2003  . CARPOMETACARPEL SUSPENSION PLASTY Right 09/22/2016    Procedure: right thumb carpometacarpal  ARTHROPLASTY;  Surgeon: Leanora Cover, MD;  Location: [facility];  Service: Orthopedics;  Laterality: Right;  right thumb carpometacarpal  ARTHROPLASTY  . CARPOMETACARPEL SUSPENSION PLASTY Right 11/02/2017   Procedure: RIGHT THUMB SUSPENSIONPLASTY WITH TIGHTROPE, DISTAL POLE SCAPHOID  EXCISION;  Surgeon: Leanora Cover, MD;  Location: [facility];  Service: Orthopedics;  Laterality: Right;  . COLONOSCOPY WITH PROPOFOL N/A 10/02/2019   Procedure: COLONOSCOPY WITH PROPOFOL;  Surgeon: Lavena Bullion, DO;  Location: [facility];  Service: Gastroenterology;  Laterality: N/A;  . COLONOSCOPY WITH PROPOFOL N/A 10/09/2019   Procedure: COLONOSCOPY WITH PROPOFOL;  Surgeon: Gatha Mayer, MD;  Location: [facility];  Service: Endoscopy;  Laterality: N/A;  . ELBOW SURGERY Left   . ENDOSCOPIC MUCOSAL RESECTION N/A 10/02/2019   Procedure: ENDOSCOPIC MUCOSAL RESECTION;  Surgeon: Lavena Bullion, DO;  Location: [facility];  Service: Gastroenterology;  Laterality: N/A;  . HEMOSTASIS CLIP PLACEMENT  10/09/2019   Procedure: HEMOSTASIS CLIP PLACEMENT;  Surgeon: Gatha Mayer, MD;  Location: [facility];  Service: Endoscopy;;  . INGUINAL HERNIA REPAIR Right   . OPEN REDUCTION INTERNAL FIXATION (ORIF) DISTAL RADIAL FRACTURE Right 10/29/2015   Procedure: OPEN REDUCTION INTERNAL FIXATION (ORIF) DISTAL RADIAL FRACTURE;  Surgeon: Leanora Cover, MD;  Location: [facility];  Service: Orthopedics;  Laterality: Right;  . ORIF ELBOW FRACTURE Right   . SUBMUCOSAL LIFTING INJECTION  10/02/2019   Procedure: SUBMUCOSAL LIFTING INJECTION;  Surgeon: Lavena Bullion, DO;  Location: [facility];  Service: Gastroenterology;;  . TONSILLECTOMY    . TOTAL KNEE ARTHROPLASTY Bilateral   . ULNAR COLLATERAL LIGAMENT REPAIR Right 12/29/2015   Procedure: REPAIR  RIGHT LATERAL ULNAR COLLATERAL LIGAMENT TEAR  EXTENSOR ORIGIN ;  Surgeon: Leanora Cover, MD;   Location: [facility];  Service: Orthopedics;  Laterality: Right;  . WRIST ARTHROSCOPY WITH DEBRIDEMENT Right 07/14/2016   Procedure: RIGHT WRIST ARTHROSCOPY WITH DEBRIDEMENT  TRIANGULAR FIBROCARTILAGE COMPLEX;  Surgeon: Leanora Cover, MD;  Location: [facility];  Service: Orthopedics;  Laterality: Right;    Family History  Problem Relation Age of Onset  . Alcohol abuse Mother   . Ovarian cancer Mother   . Alcohol abuse Father   . Pancreatic cancer Father   . Depression Daughter   . Paranoid behavior Daughter   . Hyperlipidemia Brother   . Barrett's esophagus Brother   . Colon cancer Maternal Grandmother   . Esophageal cancer Neg Hx   . Stomach cancer Neg Hx   . Rectal cancer Neg Hx        Current Outpatient Medications:  .  albuterol (VENTOLIN HFA) 108 (90 Base) MCG/ACT inhaler, Inhale 2 puffs into the lungs every 6 (six) hours as needed for wheezing or shortness of breath., Disp: 6.7 g, Rfl: 1 .  Alum Hydroxide-Mag Carbonate (GAVISCON PO), Take 1 tablet by mouth 3 (three) times daily as needed (heartburn). , Disp: , Rfl:  .  ARIPiprazole ER (ABILIFY MAINTENA) 400 MG PRSY prefilled syringe, Inject 400 mg into the muscle every 28 (twenty-eight) days., Disp: 1 each, Rfl: 2 .  atorvastatin (LIPITOR) 40 MG tablet, TAKE 1 TABLET BY MOUTH EVERY DAY, Disp: 90 tablet, Rfl: 0 .  benztropine (COGENTIN) 1 MG tablet, Take 1 tablet (1 mg total) by mouth daily., Disp: 90 tablet, Rfl: 0 .  citalopram (CELEXA) 20 MG tablet, Take 1 tablet (20 mg total) by mouth daily., Disp: 90 tablet, Rfl: 0 .  ezetimibe (ZETIA) 10 MG tablet, Take 1 tablet (10 mg total) by mouth daily., Disp: 90 tablet, Rfl: 3 .  lisinopril (ZESTRIL) 20 MG tablet, TAKE 1 TABLET BY MOUTH EVERY DAY, Disp: 90 tablet, Rfl: 3 .  Multiple Vitamin (MULTIVITAMIN WITH MINERALS) TABS tablet, Take 1 tablet by mouth daily., Disp: , Rfl:  .  Multiple Vitamins-Minerals (PRESERVISION AREDS 2 PO), Take 1 capsule by mouth  2 (two) times daily., Disp: , Rfl:  .  oxymetazoline (AFRIN) 0.05 % nasal spray, Place 1 spray into both nostrils 2 (two) times daily as needed for congestion., Disp: , Rfl:  .  pantoprazole (PROTONIX) 40 MG tablet, Take 1 tablet (40 mg total) by mouth 2 (two) times daily., Disp: 180 tablet, Rfl: 3 .  temazepam (RESTORIL) 30 MG capsule, Take 1 capsule (30 mg total) by mouth at bedtime., Disp: 90 capsule, Rfl: 0 .  traZODone (DESYREL) 50 MG tablet, Take 1 tablet (50 mg total) by mouth at bedtime., Disp: 90 tablet, Rfl: 0 .  Vitamin D, Cholecalciferol, 1000 UNITS TABS, Take 1,000 Units by mouth daily. , Disp: , Rfl:  .  aspirin 81 MG tablet, Take 1 tablet (81 mg total) by mouth daily. Do not take again until 10/24/2019, Disp: 30 tablet, Rfl:  .  fenofibrate (TRICOR) 145 MG tablet, Take 1 tablet (145 mg total) by mouth daily. (Patient not taking: Reported on 02/21/2020), Disp: 90 tablet, Rfl: 3 .  Omega-3 Fatty Acids (FISH OIL PO), Take 1 capsule by  mouth daily. , Disp: , Rfl:   Current Facility-Administered Medications:  .  ARIPiprazole ER (ABILIFY MAINTENA) 400 MG prefilled syringe 400 mg, 400 mg, Intramuscular, Q28 days, Arfeen, Arlyce Harman, MD, 400 mg at 02/18/20 1128  EXAM:  VITALS per patient if applicable:  GENERAL: alert, oriented, appears well and in no acute distress  HEENT: atraumatic, conjunttiva clear, no obvious abnormalities on inspection of external nose and ears  NECK: normal movements of the head and neck  LUNGS: on inspection no signs of respiratory distress, breathing rate appears normal, no obvious gross SOB, gasping or wheezing  CV: no obvious cyanosis  MS: moves all visible extremities without noticeable abnormality  PSYCH/NEURO: pleasant and cooperative, no obvious depression or anxiety, speech and thought processing grossly intact  ASSESSMENT AND PLAN:  Discussed the following assessment and plan:  1. Acute non-recurrent maxillary sinusitis -Signs and symptoms  consistent with acute sinus infection.  Due to worsening symptoms we will treat with doxycycline twice daily x7 days.  Advised to use over-the-counter Flonase as well.  Follow-up if no improvement in the next 2 to 3 days - doxycycline (VIBRAMYCIN) 100 MG capsule; Take 1 capsule (100 mg total) by mouth 2 (two) times daily.  Dispense: 14 capsule; Refill: 0     I discussed the assessment and treatment plan with the patient. The patient was provided an opportunity to ask questions and all were answered. The patient agreed with the plan and demonstrated an understanding of the instructions.   The patient was advised to call back or seek an in-person evaluation if the symptoms worsen or if the condition fails to improve as anticipated.   Sean Peng, NP

## 2020-02-24 ENCOUNTER — Telehealth: Payer: Self-pay | Admitting: Adult Health

## 2020-02-24 NOTE — Progress Notes (Signed)
  Chronic Care Management   Note  02/24/2020 Name: Sean Hill MRN: [id number] DOB: 11/26/1940  Sean Hill is a 79 y.o. year old male who is a primary care patient of [name], NP. I reached out to [name] by phone today in response to a referral sent by [name]'s PCP, [name], NP.   [name] was given information about Chronic Care Management services today including:  1. CCM service includes personalized support from designated clinical staff supervised by his physician, including individualized plan of care and coordination with other care providers 2. 24/7 contact phone numbers for assistance for urgent and routine care needs. 3. Service will only be billed when office clinical staff spend 20 minutes or more in a month to coordinate care. 4. Only one practitioner may furnish and bill the service in a calendar month. 5. The patient may stop CCM services at any time (effective at the end of the month) by phone call to the office staff.   Patient agreed to services and verbal consent obtained.   Follow up plan:  [place]

## 2020-03-16 ENCOUNTER — Telehealth: Payer: Self-pay | Admitting: Gastroenterology

## 2020-03-16 NOTE — Telephone Encounter (Signed)
Pt is wanting to schedule his recall colon polpys to be scheduled at [facility].

## 2020-03-16 NOTE — Telephone Encounter (Signed)
LMOM for patient to call back.

## 2020-03-17 ENCOUNTER — Other Ambulatory Visit: Payer: Self-pay | Admitting: Gastroenterology

## 2020-03-17 DIAGNOSIS — Z86010 Personal history of colonic polyps: Secondary | ICD-10-CM

## 2020-03-17 DIAGNOSIS — D126 Benign neoplasm of colon, unspecified: Secondary | ICD-10-CM

## 2020-03-17 MED ORDER — CLENPIQ 10-3.5-12 MG-GM -GM/160ML PO SOLN
1.0000 | Freq: Once | ORAL | 0 refills | Status: AC
Start: 2020-03-17 — End: 2020-03-17

## 2020-03-17 NOTE — Telephone Encounter (Signed)
Spoke to patient to scheduled his colonoscopy at [facility] on 8/111/21 at 8:30. Went over procedure instructions and sent through [employer]. Covid screening on 04/27/20. All questions answered. Patient voiced understanding.

## 2020-03-20 ENCOUNTER — Other Ambulatory Visit: Payer: Self-pay

## 2020-03-20 ENCOUNTER — Encounter (HOSPITAL_COMMUNITY): Payer: Self-pay | Admitting: *Deleted

## 2020-03-20 ENCOUNTER — Ambulatory Visit (INDEPENDENT_AMBULATORY_CARE_PROVIDER_SITE_OTHER): Payer: Medicare HMO | Admitting: *Deleted

## 2020-03-20 VITALS — BP 130/75 | HR 82 | Resp 20 | Ht 64.0 in | Wt 260.0 lb

## 2020-03-20 DIAGNOSIS — R69 Illness, unspecified: Secondary | ICD-10-CM | POA: Diagnosis not present

## 2020-03-20 DIAGNOSIS — F319 Bipolar disorder, unspecified: Secondary | ICD-10-CM

## 2020-03-20 NOTE — Progress Notes (Signed)
Pt in clinic today for due monthly injection of Abilify Maintena 400mg  as ordered. Pt pleasant and cooperative on approach. Injection given in right upper outer quadrant w/o c/o. Pt looking forward to some trips coming up he says. Sean Hill is to return in approximately 4 weeks for next due injection. Pt verbalizes understanding.

## 2020-03-21 ENCOUNTER — Other Ambulatory Visit (HOSPITAL_COMMUNITY): Payer: Self-pay | Admitting: Psychiatry

## 2020-03-21 DIAGNOSIS — F319 Bipolar disorder, unspecified: Secondary | ICD-10-CM

## 2020-03-21 DIAGNOSIS — F411 Generalized anxiety disorder: Secondary | ICD-10-CM

## 2020-03-24 ENCOUNTER — Telehealth (INDEPENDENT_AMBULATORY_CARE_PROVIDER_SITE_OTHER): Payer: Medicare HMO | Admitting: Psychiatry

## 2020-03-24 ENCOUNTER — Encounter (HOSPITAL_COMMUNITY): Payer: Self-pay | Admitting: Psychiatry

## 2020-03-24 ENCOUNTER — Other Ambulatory Visit: Payer: Self-pay

## 2020-03-24 DIAGNOSIS — F411 Generalized anxiety disorder: Secondary | ICD-10-CM

## 2020-03-24 DIAGNOSIS — F319 Bipolar disorder, unspecified: Secondary | ICD-10-CM | POA: Diagnosis not present

## 2020-03-24 DIAGNOSIS — R69 Illness, unspecified: Secondary | ICD-10-CM | POA: Diagnosis not present

## 2020-03-24 MED ORDER — TEMAZEPAM 30 MG PO CAPS: 30.0000 mg | ORAL_CAPSULE | Freq: Every day | ORAL | 0 refills | Status: DC

## 2020-03-24 MED ORDER — CITALOPRAM HYDROBROMIDE 20 MG PO TABS: 20.0000 mg | ORAL_TABLET | Freq: Every day | ORAL | 0 refills | Status: DC

## 2020-03-24 MED ORDER — BENZTROPINE MESYLATE 1 MG PO TABS: 1.0000 mg | ORAL_TABLET | Freq: Every day | ORAL | 0 refills | Status: DC

## 2020-03-24 MED ORDER — ARIPIPRAZOLE ER 400 MG IM PRSY: 400.0000 mg | PREFILLED_SYRINGE | INTRAMUSCULAR | 2 refills | Status: DC

## 2020-03-24 MED ORDER — TRAZODONE HCL 50 MG PO TABS: 50.0000 mg | ORAL_TABLET | Freq: Every day | ORAL | 0 refills | Status: DC

## 2020-03-24 NOTE — Progress Notes (Signed)
Virtual Visit via Telephone Note  I connected with Sean Hill on 03/24/20 at  9:00 AM EDT by telephone and verified that I am speaking with the correct person using two identifiers.  Location: Patient: home Provider: home office   I discussed the limitations, risks, security and privacy concerns of performing an evaluation and management service by telephone and the availability of in person appointments. I also discussed with the patient that there may be a patient responsible charge related to this service. The patient expressed understanding and agreed to proceed.   History of Present Illness: Patient is evaluated by phone session.  He is doing well on his medication.  He is taking trazodone and temazepam to help with sleep.  He has mild tremors but he feels the Cogentin helps those tremors and does not interfere with his daily activities.  Denies any irritability, anger, mania or any psychosis.  He is getting Abilify injection every month.  He is also in therapy with [name].  He is happy because his family member came on July 4 and he had a cookout.  He reported his appetite is okay and his weight is stable for past few months.  He is going to have another colonoscopy in few weeks to rule out if there is any mass and polyp remaining which was removed few months ago.  Patient lives with his wife who is supportive.  He does not want to change medication.   Past Psychiatric History:Reviewed. H/Omultiple hospitalization. Last hospitalization in July 2014.H/Ooverdose onLatudawith alcohol.H/Ocutting wrist. Took Tegretol, Depakote, Ambien, Remeron, Vistaril, Geodon, Abilify, lithium, Provigil, Zoloft, Neurontin, Lamictal, Wellbutrin, Risperdal and Pristiq. H/Omania,aggression, getting speeding tickets, excessive buying and impulsive behavior.  No results found for this or any previous visit (from the past 2160 hour(s)).  Psychiatric Specialty Exam: Physical Exam  Review of  Systems  Weight 260 lb (117.9 kg).There is no height or weight on file to calculate BMI.  General Appearance: NA  Eye Contact:  NA  Speech:  Slow  Volume:  Normal  Mood:  Euthymic  Affect:  NA  Thought Process:  Goal Directed  Orientation:  Full (Time, Place, and Person)  Thought Content:  Logical  Suicidal Thoughts:  No  Homicidal Thoughts:  No  Memory:  Immediate;   Good Recent;   Good Remote;   Fair  Judgement:  Intact  Insight:  Present  Psychomotor Activity:  NA  Concentration:  Concentration: Fair and Attention Span: Fair  Recall:  Good  Fund of Knowledge:  Good  Language:  Good  Akathisia:  No  Handed:  Right  AIMS (if indicated):     Assets:  Communication Skills Desire for Improvement Housing Resilience Social Support  ADL's:  Intact  Cognition:  WNL  Sleep:   good      Assessment and Plan: Bipolar disorder type I.  Generalized anxiety disorder.  Patient is a stable on his current medication.  I reviewed his last blood work.  His hemoglobin A1c is 6.  His anxiety is under control.  We will continue Celexa 20 mg daily, temazepam, Cogentin at bedtime and Abilify injection 400 mg intramuscular every 4 weeks.  Encouraged to continue therapy with [name].  Recommended to call us back if is any question or any concern.  Follow-up in 3 months.  Follow Up Instructions:    I discussed the assessment and treatment plan with the patient. The patient was provided an opportunity to ask questions and all were answered. The patient  agreed with the plan and demonstrated an understanding of the instructions.   The patient was advised to call back or seek an in-person evaluation if the symptoms worsen or if the condition fails to improve as anticipated.  I provided 15 minutes of non-face-to-face time during this encounter.   [name], MD

## 2020-03-26 DIAGNOSIS — F411 Generalized anxiety disorder: Secondary | ICD-10-CM | POA: Diagnosis not present

## 2020-03-26 DIAGNOSIS — R69 Illness, unspecified: Secondary | ICD-10-CM | POA: Diagnosis not present

## 2020-03-27 DIAGNOSIS — H353232 Exudative age-related macular degeneration, bilateral, with inactive choroidal neovascularization: Secondary | ICD-10-CM | POA: Diagnosis not present

## 2020-03-30 DIAGNOSIS — H4900 Third [oculomotor] nerve palsy, unspecified eye: Secondary | ICD-10-CM | POA: Diagnosis not present

## 2020-03-30 DIAGNOSIS — H353232 Exudative age-related macular degeneration, bilateral, with inactive choroidal neovascularization: Secondary | ICD-10-CM | POA: Diagnosis not present

## 2020-03-30 DIAGNOSIS — H532 Diplopia: Secondary | ICD-10-CM | POA: Diagnosis not present

## 2020-03-31 DIAGNOSIS — H532 Diplopia: Secondary | ICD-10-CM | POA: Diagnosis not present

## 2020-03-31 DIAGNOSIS — H353232 Exudative age-related macular degeneration, bilateral, with inactive choroidal neovascularization: Secondary | ICD-10-CM | POA: Diagnosis not present

## 2020-03-31 DIAGNOSIS — H4900 Third [oculomotor] nerve palsy, unspecified eye: Secondary | ICD-10-CM | POA: Diagnosis not present

## 2020-04-09 ENCOUNTER — Other Ambulatory Visit (HOSPITAL_COMMUNITY): Payer: Self-pay | Admitting: Psychiatry

## 2020-04-09 DIAGNOSIS — F319 Bipolar disorder, unspecified: Secondary | ICD-10-CM

## 2020-04-14 NOTE — Chronic Care Management (AMB) (Addendum)
Erroneous encounter

## 2020-04-15 ENCOUNTER — Ambulatory Visit: Payer: Medicare HMO

## 2020-04-15 ENCOUNTER — Other Ambulatory Visit: Payer: Self-pay | Admitting: Adult Health

## 2020-04-15 DIAGNOSIS — I10 Essential (primary) hypertension: Secondary | ICD-10-CM

## 2020-04-15 DIAGNOSIS — E7849 Other hyperlipidemia: Secondary | ICD-10-CM

## 2020-04-17 ENCOUNTER — Telehealth: Payer: Self-pay | Admitting: Adult Health

## 2020-04-17 ENCOUNTER — Other Ambulatory Visit: Payer: Self-pay

## 2020-04-17 ENCOUNTER — Ambulatory Visit (INDEPENDENT_AMBULATORY_CARE_PROVIDER_SITE_OTHER): Payer: Medicare HMO | Admitting: *Deleted

## 2020-04-17 DIAGNOSIS — F3131 Bipolar disorder, current episode depressed, mild: Secondary | ICD-10-CM | POA: Diagnosis not present

## 2020-04-17 DIAGNOSIS — R69 Illness, unspecified: Secondary | ICD-10-CM | POA: Diagnosis not present

## 2020-04-17 NOTE — Progress Notes (Signed)
  Chronic Care Management   Note  04/17/2020 Name: Sean Hill MRN: [id number] DOB: Jan 24, 1941  Sean Hill is a 79 y.o. year old male who is a primary care patient of [name], NP. I reached out to [name] by phone today in response to a referral sent by [name]'s PCP, [name], NP.   [name] was given information about Chronic Care Management services today including:  1. CCM service includes personalized support from designated clinical staff supervised by his physician, including individualized plan of care and coordination with other care providers 2. 24/7 contact phone numbers for assistance for urgent and routine care needs. 3. Service will only be billed when office clinical staff spend 20 minutes or more in a month to coordinate care. 4. Only one practitioner may furnish and bill the service in a calendar month. 5. The patient may stop CCM services at any time (effective at the end of the month) by phone call to the office staff.   Patient agreed to services and verbal consent obtained.   Follow up plan:   Carley Perdue [employer] Scheduler

## 2020-04-17 NOTE — Progress Notes (Signed)
Pt presents today for due monthly injection of Abilify Maintena 400mg . Pt pleasant and cooperative. Affect congruent. Injection given in left upper outer quadrant without c/o. Pt is wearing an eye patch over L eye due to recent onset of "double vision", otherwise no c/o. Pt to return in approximately 4 weeks for next due injection.

## 2020-04-22 ENCOUNTER — Ambulatory Visit: Payer: Medicare HMO

## 2020-04-22 ENCOUNTER — Other Ambulatory Visit: Payer: Self-pay

## 2020-04-22 DIAGNOSIS — I10 Essential (primary) hypertension: Secondary | ICD-10-CM

## 2020-04-22 DIAGNOSIS — G4709 Other insomnia: Secondary | ICD-10-CM

## 2020-04-22 DIAGNOSIS — E7849 Other hyperlipidemia: Secondary | ICD-10-CM

## 2020-04-22 DIAGNOSIS — E7439 Other disorders of intestinal carbohydrate absorption: Secondary | ICD-10-CM

## 2020-04-22 DIAGNOSIS — K2100 Gastro-esophageal reflux disease with esophagitis, without bleeding: Secondary | ICD-10-CM

## 2020-04-22 DIAGNOSIS — F3131 Bipolar disorder, current episode depressed, mild: Secondary | ICD-10-CM

## 2020-04-22 NOTE — Chronic Care Management (AMB) (Signed)
Chronic Care Management Pharmacy  Name: Sean Hill  MRN: [id number] DOB: [birth year]  Initial Questions: 1. Have you seen any other providers since your last visit? N/A 2. Any changes in your medicines or health? No   Chief Complaint/ HPI  Sean Hill,  79 y.o. , male presents for their Initial CCM visit with the clinical pharmacist via telephone due to COVID-19 Pandemic   Patient reports was not comfortable with Zetia after reading side effects and "it was too expensive at pickup".   Patient reports having double vision currently. 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 He reports he was told it would correct itself, but will take time. He reports so far it has been a month and wears an eye patch to decrease dizziness.  PCP : [name], NP  Their chronic conditions include: GERD, HTN, bipolar, HLD, insomnia, glucose intolerance  Office Visits: 02/21/2020- [name], NP- Patient presented for office visit for right-sided maxillary sinus pain, pressure, nasal congestion, headache, and tooth pain. Patient to start doxycycline BID for 7 days and Patient to use over the counter fluticasone nasal spray.   12/25/2019- [name], NP- Patient presented for office visit for annual exam. Patient to obtain routine lab work (CBC, CMET, lipid panel, TSH, A1c). Consider adding Zetia and/or increasing Lipitor.   Consult Visit: 03/26/2020- Psychiatry- Gracelyn Nurse, LCSW- unable to access notes  03/24/2020- Behavioral health- Berniece Andreas, MD- unable to access notes   Medications: Outpatient Encounter Medications as of 04/22/2020  Medication Sig  . ARIPiprazole ER (ABILIFY MAINTENA) 400 MG PRSY prefilled syringe Inject 400 mg into the muscle every 28 (twenty-eight) days.  . benztropine (COGENTIN) 1 MG tablet Take 1 tablet (1 mg total) by mouth daily.  . citalopram (CELEXA) 20 MG tablet Take 1 tablet (20 mg total) by  mouth daily.  . fluticasone (FLONASE) 50 MCG/ACT nasal spray Place 1 spray into both nostrils in the morning and at bedtime.  Marland Kitchen lisinopril (ZESTRIL) 20 MG tablet TAKE 1 TABLET BY MOUTH EVERY DAY  . Multiple Vitamin (MULTIVITAMIN WITH MINERALS) TABS tablet Take 1 tablet by mouth daily.  . Multiple Vitamins-Minerals (PRESERVISION AREDS 2 PO) Take 1 capsule by mouth 2 (two) times daily.  . Omega-3 Fatty Acids (FISH OIL PO) Take 1 capsule by mouth daily.   . pantoprazole (PROTONIX) 40 MG tablet Take 1 tablet (40 mg total) by mouth 2 (two) times daily.  . temazepam (RESTORIL) 30 MG capsule Take 1 capsule (30 mg total) by mouth at bedtime.  . traZODone (DESYREL) 50 MG tablet Take 1 tablet (50 mg total) by mouth at bedtime.  . Vitamin D, Cholecalciferol, 1000 UNITS TABS Take 1,000 Units by mouth daily.   Marland Kitchen albuterol (VENTOLIN HFA) 108 (90 Base) MCG/ACT inhaler Inhale 2 puffs into the lungs every 6 (six) hours as needed for wheezing or shortness of breath. (Patient not taking: Reported on 04/22/2020)  . Alum Hydroxide-Mag Carbonate (GAVISCON PO) Take 1 tablet by mouth 3 (three) times daily as needed (heartburn).  (Patient not taking: Reported on 04/27/2020)  . oxymetazoline (AFRIN) 0.05 % nasal spray Place 1 spray into both nostrils 2 (two) times daily as needed for congestion. (Patient not taking: Reported on 04/22/2020)  . [DISCONTINUED] atorvastatin (LIPITOR) 40 MG tablet TAKE 1 TABLET BY MOUTH EVERY DAY (Patient not taking: Reported on 04/15/2020)  . [DISCONTINUED] doxycycline (VIBRAMYCIN) 100 MG capsule Take 1 capsule (100 mg total) by mouth  2 (two) times daily. (Patient not taking: Reported on 03/24/2020)  . [DISCONTINUED] ezetimibe (ZETIA) 10 MG tablet Take 1 tablet (10 mg total) by mouth daily. (Patient not taking: Reported on 04/15/2020)  . [DISCONTINUED] fenofibrate (TRICOR) 145 MG tablet Take 1 tablet (145 mg total) by mouth daily. (Patient not taking: Reported on 02/21/2020)   Facility-Administered  Encounter Medications as of 04/22/2020  Medication  . ARIPiprazole ER (ABILIFY MAINTENA) 400 MG prefilled syringe 400 mg     Current Diagnosis/Assessment:  Goals Addressed            This Visit's Progress   . Pharmacy Care Plan       CARE PLAN ENTRY (see longitudinal plan of care for additional care plan information)  Current Barriers:  . Chronic Disease Management support, education, and care coordination needs related to Hypertension, Hyperlipidemia, GERD, and Bipolar disorder, Insomnia, glucose intolerance    Hypertension BP Readings from Last 3 Encounters:  04/17/20 (!) 161/83  03/20/20 130/75  02/21/20 (!) 146/74   . Pharmacist Clinical Goal(s): o Over the next 120 days, patient will work with PharmD and providers to maintain BP goal <130/80 . Current regimen:  o Lisinopril 36m, 2 tablet once daily  . Patient self care activities - Over the next 120 days, patient will: o Check BP 1 to 2 times per week, document, and provide at future appointments o Ensure daily salt intake < 2300 mg/day  Hyperlipidemia Lab Results  Component Value Date/Time   LDLDIRECT 72.0 12/25/2019 08:23 AM   Triglycerides  Date Value Ref Range Status  12/25/2019 265.0 (H) 0 - 149 mg/dL Final    Comment:    Normal:  <150 mg/dLBorderline High:  150 - 199 mg/dL .  Pharmacist Clinical Goal(s): o Over the next 120 days, patient will work with PharmD and providers to maintain LDL goal < 100 and achieve triglyceride goal: <150 . Current regimen:  o No medications currently.  . Interventions: o Reviewed formulary for ezetimibe. It is tier 4 medication, therefore, has $150 deductible. After paying deductible, copay: ~$25 for 3 month supply.  o Recommend restarting fenofibrate and ezetimibe.  . Patient self care activities - Over the next 120 days, patient will: o Continue current medications as directed by provider.   Glucose intolerance Lab Results  Component Value Date/Time   HGBA1C 6.0  12/25/2019 08:23 AM   HGBA1C 6.1 11/07/2018 07:41 AM   . Pharmacist Clinical Goal(s): o Over the next 120 days, patient will work with PharmD and providers to maintain A1c goal <6.5% . Current regimen:  o No medications . Interventions: o Recommend:   Modifying lifestyle, including to participate in moderate physical activity (e.g., walking) at least 150 minutes per week.   Mediterranean eating plan with an emphasis on whole grains, legumes, nuts, fruits, and vegetables and minimal refined and processed foods.  . Patient self care activities - Over the next 120 days, patient will: o Continue working on keeping blood pressure and lipids under goal.   GERD . Pharmacist Clinical Goal(s) o Over the next 120 days, patient will work with PharmD and providers to reduce acid reflux . Current regimen:  o Pantoprazole 471m 1 tablet twice daily . Interventions: o Recommend: non-pharmacological interventions for acid reflux. Take measures to prevent acid reflux, such as avoiding spicy foods, avoiding caffeine, avoid laying down a few hours after eating, and raising the head of the bed . Patient self care activities - Over the next 120 days, patient will: o Continue  current medications as directed by provider.   Insomnia . Pharmacist Clinical Goal(s) o Over the next 120 days, patient will work with PharmD and providers to improve sleep.  . Current regimen:  o Temazepam 33m, 1 capsule at bedtime  . Patient self care activities o Patient will continue current medications as directed by provider.   Bipolar disorder . Pharmacist Clinical Goal(s) o Over the next 120 days, patient will work with PharmD and providers to maintain/ stabilize mood symptoms.  . Current regimen:  . Aripiprazole ER 4023m inject 40013mvery twenty-eight days  . Citalopram 62m75m tablet once daily . Trazodone 50mg7mtablet at bedtime  . Benztropine 1mg, 58mablet once daily (for tardive dyskinesia  prevention) . Patient self care activities o Patient will continue current medications as directed by provider.   Medication management . Pharmacist Clinical Goal(s): o Over the next 120 days, patient will work with PharmD and providers to maintain optimal medication adherence . Current pharmacy: [facility] . Interventions o Comprehensive medication review performed. o Continue current medication management strategy . Patient self care activities - Over the next 120 days, patient will: o Take medications as prescribed o Report any questions or concerns to PharmD and/or provider(s)  Please see past updates related to this goal by clicking on the "Past Updates" button in the selected goal        SDOH Interventions     Most Recent Value  SDOH Interventions  Financial Strain Interventions Other (Comment)  [Formulary review for medication cost. Patient reported ezetimibe $180-200/ 3 months supply.]  Transportation Interventions Intervention Not Indicated       Hypertension   Reports dizziness/ lightheadedness due to vision changes.   Office blood pressures are  BP Readings from Last 3 Encounters:  04/29/20 (!) 144/56  04/17/20 (!) 161/83  03/20/20 130/75    Patient has failed these meds in the past: amlodipine   Patient checks BP at home 1-2x per week  Patient home BP readings are ranging: 120-140/ 80  Patient is controlled on:   Lisinopril 62mg, 21mblet once daily    Plan Continue current medications   Hyperlipidemia   Telephone note: 12/25/2019- Zetia started.   LDL goal < 100  Lipid Panel     Component Value Date/Time   CHOL 140 12/25/2019 0823   TRIG 265.0 (H) 12/25/2019 0823   HDL 32.00 (L) 12/25/2019 0823   LDLDIRECT 72.0 12/25/2019 0823    Hepatic Function Latest Ref Rng & Units 12/25/2019 10/09/2019 10/08/2019  Total Protein 6.0 - 8.3 g/dL 6.7 6.4(L) 6.4(L)  Albumin 3.5 - 5.2 g/dL 4.5 3.9 4.0  AST 0 - 37 U/L _0 ALT 0 - 53 U/L _1 Alk  Phosphatase 39 - 117 U/L 59 59 62  Total Bilirubin 0.2 - 1.2 mg/dL 0.5 0.6 0.5  Bilirubin, Direct 0.0 - 0.3 mg/dL - - -     The 10-year ASCVD risk score (Goff DMikey Bussing, et al., 2013) is: 38.7%   Values used to calculate the score:     Age: 89 year70    Sex: Male     Is Non-Hispanic African American: No     Diabetic: No     Tobacco smoker: No     Systolic Blood Pressure: 144 mmH381   Is BP treated: Yes     HDL Cholesterol: 32 mg/dL     Total Cholesterol: 140 mg/dL    Patient has failed these meds  in past:  . Atorvastatin '40mg'$ , 1 tablet once daily (reports stop taking about 3 months ago due concern of muscle pain)  . Ezetimibe '10mg'$ , 1 tablet once daily (reports did not start taking due to cost) . Fenofibrate '145mg'$ , 1 tablet once daily    Patient is currently uncontrolled on the following medications:  . Fish oil , 1 capsule once daily (in the morning)   We discussed:  - recommend retrial of ezetimibe and continue taking fenofibrate as it is possible patient needed to meet deductible previously and reason for higher copay.   Plan Continue current medications    Bipolar disorder   Patient is currently controlled on the following medications:  . Aripiprazole ER '400mg'$ , inject '400mg'$  every twenty-eight days  . Citalopram '20mg'$ , 1 tablet once daily . Trazodone '50mg'$ , 1 tablet at bedtime   Tardive dyskinesia prevention:  (per patient report)   Benztropine '1mg'$ , 1 tablet once daily   Plan Managed by psychiatrist  Continue current medications    Insomnia   Reports mind races at night and reports temazepam helps him calm down. Patient reports being on this for about  7 to 8 years  Patient is currently controlled on the following medications:  . Temazepam '30mg'$ , 1 capsule at bedtime   Plan Continue current medications  GERD   Patient reports triggers:caffeine (has taken out of diet) and has helped some. Patient reports he stays away from tomato sauce.   Patient has failed  these meds in past:  Alum hydroxide- mag carbonate, 1 tablet three times daily as needed for heartburn  Patient is currently controlled on the following medications:  . Pantoprazole '40mg'$ , 1 tablet twice daily (reports usually taking once daily)    We discussed:  non-pharmacological interventions for acid reflux. Take measures to prevent acid reflux, such as avoiding spicy foods, avoiding caffeine, avoid laying down a few hours after eating, and raising the head of the bed.  Plan Continue current medications  Glucose intolerance    Recent Relevant Labs: Lab Results  Component Value Date/Time   HGBA1C 6.0 12/25/2019 08:23 AM   HGBA1C 6.1 11/07/2018 07:41 AM   MICROALBUR 1.5 11/28/2011 04:28 PM     Patient is currently controlled on the following medications:   No medications  Last diabetic Eye exam: No results found for: HMDIABEYEEXA  Last diabetic Foot exam: No results found for: HMDIABFOOTEX   We discussed:   modifying lifestyle, including to participate in moderate physical activity (e.g., walking) at least 150 minutes per week.   Mediterranean eating plan with an emphasis on whole grains, legumes, nuts, fruits, and vegetables and minimal refined and processed foods.   Plan Focus on managing/monitoring CVD risk factors, including keeping blood pressure and lipids under goal Continue as is.   Sinus congestion   Patient has failed these meds in past: oxymetazolin (Afrin)   Patient is currently controlled on the following medications:  . Fluticasone nasal spray, 1 spray into both nostrils in the morning and at bedtime    Plan Continue current medications  OTC/ supplements    Patient is currently on the following medications:  Marland Kitchen Multivitamin, 1 tablet once daily . Preservision AREDS 2, 1 capsule twice daily . Vitamin D3, 1,000 units once daily   Plan Continue current medications  Vaccines   Reviewed and discussed patient's vaccination history.     Immunization History  Administered Date(s) Administered  . Fluad Quad(high Dose 65+) 05/29/2019  . Influenza Inj Mdck Quad Pf 05/29/2019  .  Influenza, High Dose Seasonal PF 06/07/2016, 05/15/2017  . Influenza-Unspecified 07/20/2013  . PFIZER SARS-COV-2 Vaccination 10/25/2019, 11/19/2019  . Pneumococcal Conjugate-13 03/11/2015  . Pneumococcal Polysaccharide-23 07/06/2010  . Tdap 07/06/2010, 10/29/2015    Plan Address at follow up. Recommend shingles vaccine.     Medication Management  Patient organizes medications:  - reports using 2 pill boxes (AM and PM) fills once a week Primary pharmacy: [facility] Adherence:  - atorvastatin 73m (last refilled 11/13/19 for 90DS) - fenofibrate 1435m (last refilled 01/18/20 for 90DS) - no recent fill for ezetimbe   Plan Formulary review. Ezetimibe for 90 day supply: $25.46 with a $150 deductible.    Follow-up Follow up visit with PharmD in 4 months.    AnAnson CroftsPharmD, BCACP Clinical Pharmacist [phone]

## 2020-04-27 ENCOUNTER — Other Ambulatory Visit (HOSPITAL_COMMUNITY)
Admission: RE | Admit: 2020-04-27 | Discharge: 2020-04-27 | Disposition: A | Discharge status: Home / Self Care | Payer: Medicare HMO | Source: Ambulatory Visit | Attending: Gastroenterology | Admitting: Gastroenterology

## 2020-04-27 DIAGNOSIS — Z20822 Contact with and (suspected) exposure to covid-19: Secondary | ICD-10-CM | POA: Diagnosis not present

## 2020-04-27 DIAGNOSIS — Z01812 Encounter for preprocedural laboratory examination: Secondary | ICD-10-CM | POA: Insufficient documentation

## 2020-04-27 LAB — SARS CORONAVIRUS 2 (TAT 6-24 HRS): SARS Coronavirus 2: NEGATIVE

## 2020-04-28 DIAGNOSIS — H353222 Exudative age-related macular degeneration, left eye, with inactive choroidal neovascularization: Secondary | ICD-10-CM | POA: Diagnosis not present

## 2020-04-28 DIAGNOSIS — H353211 Exudative age-related macular degeneration, right eye, with active choroidal neovascularization: Secondary | ICD-10-CM | POA: Diagnosis not present

## 2020-04-28 DIAGNOSIS — H532 Diplopia: Secondary | ICD-10-CM | POA: Diagnosis not present

## 2020-04-28 NOTE — Patient Instructions (Addendum)
Visit Information  Goals Addressed            This Visit's Progress   . Pharmacy Care Plan       CARE PLAN ENTRY (see longitudinal plan of care for additional care plan information)  Current Barriers:  . Chronic Disease Management support, education, and care coordination needs related to Hypertension, Hyperlipidemia, GERD, and Bipolar disorder, Insomnia, glucose intolerance    Hypertension BP Readings from Last 3 Encounters:  04/17/20 (!) 161/83  03/20/20 130/75  02/21/20 (!) 146/74   . Pharmacist Clinical Goal(s): o Over the next 120 days, patient will work with PharmD and providers to maintain BP goal <130/80 . Current regimen:  o Lisinopril 20mg , 2 tablet once daily  . Patient self care activities - Over the next 120 days, patient will: o Check BP 1 to 2 times per week, document, and provide at future appointments o Ensure daily salt intake < 2300 mg/day  Hyperlipidemia Lab Results  Component Value Date/Time   LDLDIRECT 72.0 12/25/2019 08:23 AM   Triglycerides  Date Value Ref Range Status  12/25/2019 265.0 (H) 0 - 149 mg/dL Final    Comment:    Normal:  <150 mg/dLBorderline High:  150 - 199 mg/dL .  Pharmacist Clinical Goal(s): o Over the next 120 days, patient will work with PharmD and providers to maintain LDL goal < 100 and achieve triglyceride goal: <150 . Current regimen:  o No medications currently.  . Interventions: o Reviewed formulary for ezetimibe. It is tier 4 medication, therefore, has $150 deductible. After paying deductible, copay: ~$25 for 3 month supply.  o Recommend restarting fenofibrate and ezetimibe.  . Patient self care activities - Over the next 120 days, patient will: o Continue current medications as directed by provider.   Glucose intolerance Lab Results  Component Value Date/Time   HGBA1C 6.0 12/25/2019 08:23 AM   HGBA1C 6.1 11/07/2018 07:41 AM   . Pharmacist Clinical Goal(s): o Over the next 120 days, patient will work with PharmD  and providers to maintain A1c goal <6.5% . Current regimen:  o No medications . Interventions: o Recommend:   Modifying lifestyle, including to participate in moderate physical activity (e.g., walking) at least 150 minutes per week.   Mediterranean eating plan with an emphasis on whole grains, legumes, nuts, fruits, and vegetables and minimal refined and processed foods.  . Patient self care activities - Over the next 120 days, patient will: o Continue working on keeping blood pressure and lipids under goal.   GERD . Pharmacist Clinical Goal(s) o Over the next 120 days, patient will work with PharmD and providers to reduce acid reflux . Current regimen:  o Pantoprazole 40mg , 1 tablet twice daily . Interventions: o Recommend: non-pharmacological interventions for acid reflux. Take measures to prevent acid reflux, such as avoiding spicy foods, avoiding caffeine, avoid laying down a few hours after eating, and raising the head of the bed . Patient self care activities - Over the next 120 days, patient will: o Continue current medications as directed by provider.   Insomnia . Pharmacist Clinical Goal(s) o Over the next 120 days, patient will work with PharmD and providers to improve sleep.  . Current regimen:  o Temazepam 30mg , 1 capsule at bedtime  . Patient self care activities o Patient will continue current medications as directed by provider.   Bipolar disorder . Pharmacist Clinical Goal(s) o Over the next 120 days, patient will work with PharmD and providers to maintain/ stabilize mood symptoms.  Marland Kitchen  Current regimen:  . Aripiprazole ER 400mg , inject 400mg  every twenty-eight days  . Citalopram 20mg , 1 tablet once daily . Trazodone 50mg , 1 tablet at bedtime  . Benztropine 1mg , 1 tablet once daily (for tardive dyskinesia prevention) . Patient self care activities o Patient will continue current medications as directed by provider.   Medication management . Pharmacist Clinical  Goal(s): o Over the next 120 days, patient will work with PharmD and providers to maintain optimal medication adherence . Current pharmacy: [facility] . Interventions o Comprehensive medication review performed. o Continue current medication management strategy . Patient self care activities - Over the next 120 days, patient will: o Take medications as prescribed o Report any questions or concerns to PharmD and/or provider(s)  Please see past updates related to this goal by clicking on the "Past Updates" button in the selected goal         Sean Hill was given information about Chronic Care Management services today including:  1. CCM service includes personalized support from designated clinical staff supervised by his physician, including individualized plan of care and coordination with other care providers 2. 24/7 contact phone numbers for assistance for urgent and routine care needs. 3. Standard insurance, coinsurance, copays and deductibles apply for chronic care management only during months in which we provide at least 20 minutes of these services. Most insurances cover these services at 100%, however patients may be responsible for any copay, coinsurance and/or deductible if applicable. This service may help you avoid the need for more expensive face-to-face services. 4. Only one practitioner may furnish and bill the service in a calendar month. 5. The patient may stop CCM services at any time (effective at the end of the month) by phone call to the office staff.  Patient agreed to services and verbal consent obtained.   The patient verbalized understanding of instructions provided today and agreed to receive a mailed copy of patient instruction and/or educational materials. The pharmacy team will reach out to the patient again over the next 30 days.    Anson Crofts, PharmD, BCACP Clinical Pharmacist [facility] [place] [phone]    High Triglycerides  Eating Plan Triglycerides are a type of fat in the blood. High levels of triglycerides can increase your risk of heart disease and stroke. If your triglyceride levels are high, choosing the right foods can help lower your triglycerides and keep your heart healthy. Work with your health care provider or a diet and nutrition specialist (dietitian) to develop an eating plan that is right for you. What are tips for following this plan? General guidelines   Lose weight, if you are overweight. For most people, losing 5-10 lbs (2-5 kg) helps lower triglyceride levels. A weight-loss plan may include. ? 30 minutes of exercise at least 5 days a week. ? Reducing the amount of calories, sugar, and fat you eat.  Eat a wide variety of fresh fruits, vegetables, and whole grains. These foods are high in fiber.  Eat foods that contain healthy fats, such as fatty fish, nuts, seeds, and olive oil.  Avoid foods that are high in added sugar, added salt (sodium), saturated fat, and trans fat.  Avoid low-fiber, refined carbohydrates such as white bread, crackers, noodles, and white rice.  Avoid foods with partially hydrogenated oils (trans fats), such as fried foods or stick margarine.  Limit alcohol intake to no more than 1 drink a day for nonpregnant women and 2 drinks a day for men. One drink  equals 12 oz of beer, 5 oz of wine, or 1 oz of hard liquor. Your health care provider may recommend that you drink less depending on your overall health. Reading food labels  Check food labels for the amount of saturated fat. Choose foods with no or very little saturated fat.  Check food labels for the amount of trans fat. Choose foods with no trans fat.  Check food labels for the amount of cholesterol. Choose foods low in cholesterol. Ask your dietitian how much cholesterol you should have each day.  Check food labels for the amount of sodium. Choose foods with less than 140 milligrams (mg) per  serving. Shopping  Buy dairy products labeled as nonfat (skim) or low-fat (1%).  Avoid buying processed or prepackaged foods. These are often high in added sugar, sodium, and fat. Cooking  Choose healthy fats when cooking, such as olive oil or canola oil.  Cook foods using lower fat methods, such as baking, broiling, boiling, or grilling.  Make your own sauces, dressings, and marinades when possible, instead of buying them. Store-bought sauces, dressings, and marinades are often high in sodium and sugar. Meal planning  Eat more home-cooked food and less restaurant, buffet, and fast food.  Eat fatty fish at least 2 times each week. Examples of fatty fish include salmon, trout, mackerel, tuna, and herring.  If you eat whole eggs, do not eat more than 3 egg yolks per week. What foods are recommended? The items listed may not be a complete list. Talk with your dietitian about what dietary choices are best for you. Grains Whole wheat or whole grain breads, crackers, cereals, and pasta. Unsweetened oatmeal. Bulgur. Barley. Quinoa. Brown rice. Whole wheat flour tortillas. Vegetables Fresh or frozen vegetables. Low-sodium canned vegetables. Fruits All fresh, canned (in natural juice), or frozen fruits. Meats and other protein foods Skinless chicken or [place]. Ground chicken or [place]. Lean cuts of pork, trimmed of fat. Fish and seafood, especially salmon, trout, and herring. Egg whites. Dried beans, peas, or lentils. Unsalted nuts or seeds. Unsalted canned beans. Natural peanut or almond butter. Dairy Low-fat dairy products. Skim or low-fat (1%) milk. Reduced fat (2%) and low-sodium cheese. Low-fat ricotta cheese. Low-fat cottage cheese. Plain, low-fat yogurt. Fats and oils Tub margarine without trans fats. Light or reduced-fat mayonnaise. Light or reduced-fat salad dressings. Avocado. Safflower, olive, sunflower, soybean, and canola oils. What foods are not recommended? The items listed  may not be a complete list. Talk with your dietitian about what dietary choices are best for you. Grains White bread. White (regular) pasta. White rice. Cornbread. Bagels. Pastries. Crackers that contain trans fat. Vegetables Creamed or fried vegetables. Vegetables in a cheese sauce. Fruits Sweetened dried fruit. Canned fruit in syrup. Fruit juice. Meats and other protein foods Fatty cuts of meat. Ribs. Chicken wings. Berniece Salines. Sausage. Bologna. Salami. Chitterlings. Fatback. Hot dogs. Bratwurst. Packaged lunch meats. Dairy Whole or reduced-fat (2%) milk. Half-and-half. Cream cheese. Full-fat or sweetened yogurt. Full-fat cheese. Nondairy creamers. Whipped toppings. Processed cheese or cheese spreads. Cheese curds. Beverages Alcohol. Sweetened drinks, such as soda, lemonade, fruit drinks, or punches. Fats and oils Butter. Stick margarine. Lard. Shortening. Ghee. Bacon fat. Tropical oils, such as coconut, palm kernel, or palm oils. Sweets and desserts Corn syrup. Sugars. Honey. Molasses. Candy. Jam and jelly. Syrup. Sweetened cereals. Cookies. Pies. Cakes. Donuts. Muffins. Ice cream. Condiments Store-bought sauces, dressings, and marinades that are high in sugar, such as ketchup and barbecue sauce. Summary  High levels of triglycerides can  increase the risk of heart disease and stroke. Choosing the right foods can help lower your triglycerides.  Eat plenty of fresh fruits, vegetables, and whole grains. Choose low-fat dairy and lean meats. Eat fatty fish at least twice a week.  Avoid processed and prepackaged foods with added sugar, sodium, saturated fat, and trans fat.  If you need suggestions or have questions about what types of food are good for you, talk with your health care provider or a dietitian. This information is not intended to replace advice given to you by your health care provider. Make sure you discuss any questions you have with your health care provider. Document Revised:  08/18/2017 Document Reviewed: 11/08/2016 Elsevier Patient Education  2020 [employer].

## 2020-04-28 NOTE — Anesthesia Preprocedure Evaluation (Addendum)
Anesthesia Evaluation  Patient identified by MRN, date of birth, ID band Patient awake    Reviewed: Allergy & Precautions, NPO status , Patient's Chart, lab work & pertinent test results  Airway Mallampati: II  TM Distance: >3 FB Neck ROM: Full    Dental  (+) Dental Advisory Given   Pulmonary asthma ,    Pulmonary exam normal breath sounds clear to auscultation       Cardiovascular hypertension, Pt. on medications Normal cardiovascular exam Rhythm:Regular Rate:Normal  LBBB  Echo 2017 - Left ventricle: The cavity size was normal. Wall thickness wasincreased in a pattern of mild LVH. Systolic function was normal.The estimated ejection fraction was in the range of 60% to 65%.Doppler parameters are consistent with abnormal left ventricularrelaxation (grade 1 diastolic dysfunction).  - Mitral valve: There was mild regurgitation.  - Pulmonary arteries: PA peak pressure: 33 mm Hg (S).    Neuro/Psych PSYCHIATRIC DISORDERS Anxiety Depression Bipolar Disorder negative neurological ROS     GI/Hepatic Neg liver ROS, GERD  ,  Endo/Other  negative endocrine ROS  Renal/GU negative Renal ROS     Musculoskeletal  (+) Arthritis ,   Abdominal (+) + obese,   Peds  Hematology negative hematology ROS (+)   Anesthesia Other Findings   Reproductive/Obstetrics negative OB ROS                           Anesthesia Physical  Anesthesia Plan  ASA: III  Anesthesia Plan: MAC   Post-op Pain Management:    Induction: Intravenous  PONV Risk Score and Plan: 1 and Propofol infusion, TIVA and Treatment may vary due to age or medical condition  Airway Management Planned: Simple Face Mask  Additional Equipment: None  Intra-op Plan:   Post-operative Plan:   Informed Consent: I have reviewed the patients History and Physical, chart, labs and discussed the procedure including the risks, benefits and  alternatives for the proposed anesthesia with the patient or authorized representative who has indicated his/her understanding and acceptance.     Dental advisory given  Plan Discussed with: CRNA  Anesthesia Plan Comments:        Anesthesia Quick Evaluation

## 2020-04-29 ENCOUNTER — Ambulatory Visit (HOSPITAL_COMMUNITY)
Admission: RE | Admit: 2020-04-29 | Discharge: 2020-04-29 | Disposition: A | Discharge status: Home / Self Care | Payer: Medicare HMO | Attending: Gastroenterology | Admitting: Gastroenterology

## 2020-04-29 ENCOUNTER — Ambulatory Visit (HOSPITAL_COMMUNITY): Payer: Medicare HMO | Admitting: Anesthesiology

## 2020-04-29 ENCOUNTER — Encounter (HOSPITAL_COMMUNITY)
Admission: RE | Disposition: A | Discharge status: Home / Self Care | Payer: Self-pay | Source: Home / Self Care | Attending: Gastroenterology

## 2020-04-29 ENCOUNTER — Encounter (HOSPITAL_COMMUNITY): Payer: Self-pay | Admitting: Gastroenterology

## 2020-04-29 ENCOUNTER — Other Ambulatory Visit: Payer: Self-pay

## 2020-04-29 DIAGNOSIS — Z86010 Personal history of colonic polyps: Secondary | ICD-10-CM | POA: Insufficient documentation

## 2020-04-29 DIAGNOSIS — D125 Benign neoplasm of sigmoid colon: Secondary | ICD-10-CM | POA: Diagnosis not present

## 2020-04-29 DIAGNOSIS — D490 Neoplasm of unspecified behavior of digestive system: Secondary | ICD-10-CM

## 2020-04-29 DIAGNOSIS — Z6838 Body mass index (BMI) 38.0-38.9, adult: Secondary | ICD-10-CM | POA: Insufficient documentation

## 2020-04-29 DIAGNOSIS — D126 Benign neoplasm of colon, unspecified: Secondary | ICD-10-CM

## 2020-04-29 DIAGNOSIS — K640 First degree hemorrhoids: Secondary | ICD-10-CM

## 2020-04-29 DIAGNOSIS — Z09 Encounter for follow-up examination after completed treatment for conditions other than malignant neoplasm: Secondary | ICD-10-CM | POA: Diagnosis present

## 2020-04-29 DIAGNOSIS — E669 Obesity, unspecified: Secondary | ICD-10-CM | POA: Insufficient documentation

## 2020-04-29 DIAGNOSIS — D120 Benign neoplasm of cecum: Secondary | ICD-10-CM | POA: Insufficient documentation

## 2020-04-29 DIAGNOSIS — I447 Left bundle-branch block, unspecified: Secondary | ICD-10-CM | POA: Insufficient documentation

## 2020-04-29 DIAGNOSIS — K648 Other hemorrhoids: Secondary | ICD-10-CM | POA: Diagnosis not present

## 2020-04-29 DIAGNOSIS — Z1211 Encounter for screening for malignant neoplasm of colon: Secondary | ICD-10-CM | POA: Diagnosis not present

## 2020-04-29 DIAGNOSIS — K5730 Diverticulosis of large intestine without perforation or abscess without bleeding: Secondary | ICD-10-CM | POA: Insufficient documentation

## 2020-04-29 DIAGNOSIS — L818 Other specified disorders of pigmentation: Secondary | ICD-10-CM | POA: Insufficient documentation

## 2020-04-29 DIAGNOSIS — I340 Nonrheumatic mitral (valve) insufficiency: Secondary | ICD-10-CM | POA: Diagnosis not present

## 2020-04-29 DIAGNOSIS — K5180 Other ulcerative colitis without complications: Secondary | ICD-10-CM | POA: Diagnosis not present

## 2020-04-29 DIAGNOSIS — K51418 Inflammatory polyps of colon with other complication: Secondary | ICD-10-CM | POA: Insufficient documentation

## 2020-04-29 HISTORY — PX: BIOPSY: SHX5522

## 2020-04-29 HISTORY — PX: POLYPECTOMY: SHX5525

## 2020-04-29 HISTORY — PX: COLONOSCOPY WITH PROPOFOL: SHX5780

## 2020-04-29 SURGERY — COLONOSCOPY WITH PROPOFOL
Anesthesia: Monitor Anesthesia Care

## 2020-04-29 MED ORDER — LIDOCAINE 2% (20 MG/ML) 5 ML SYRINGE
INTRAMUSCULAR | Status: DC | PRN
  Administered 2020-04-29: 60 mg via INTRAVENOUS

## 2020-04-29 MED ORDER — PROPOFOL 500 MG/50ML IV EMUL
INTRAVENOUS | Status: AC
  Filled 2020-04-29: qty 50

## 2020-04-29 MED ORDER — PROPOFOL 1000 MG/100ML IV EMUL
INTRAVENOUS | Status: AC
  Filled 2020-04-29: qty 100

## 2020-04-29 MED ORDER — SODIUM CHLORIDE 0.9 % IV SOLN: INTRAVENOUS | Status: DC

## 2020-04-29 MED ORDER — LACTATED RINGERS IV SOLN
INTRAVENOUS | Status: DC
  Administered 2020-04-29: 1000 mL via INTRAVENOUS

## 2020-04-29 MED ORDER — PROPOFOL 10 MG/ML IV BOLUS
INTRAVENOUS | Status: DC | PRN
  Administered 2020-04-29: 40 mg via INTRAVENOUS
  Administered 2020-04-29: 20 mg via INTRAVENOUS

## 2020-04-29 MED ORDER — PROPOFOL 500 MG/50ML IV EMUL
INTRAVENOUS | Status: DC | PRN
  Administered 2020-04-29: 125 ug/kg/min via INTRAVENOUS

## 2020-04-29 SURGICAL SUPPLY — 22 items

## 2020-04-29 NOTE — H&P (Signed)
P  Chief Complaint:    Polyp surveillance  GI History: 79 year old male witha history of pneumonia, depression, obesity (BMI 38.9), bipolar disorder, presenting for colonoscopy with EMR. 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 The larger polyp was biopsied, without any high-grade dysplasia or malignancy. Discussed resection options with the patient at length, to include surgical resection with right hemicolectomy versus colonoscopy with Endoscopic mucosal resection (EMR), and he would like to proceed with EMR. Subsequent CT abdomen/pelvis otherwise unremarkable, without any lymphadenopathy. Mildly enlarged peripancreatic/portacaval nodes (1.4 cm), unchanged in 2013 and likely reactive.  Separately, longstanding history of GERD.Index sxs of hoarseness,chronic cough, regurgitation, increased belching. No HB. Worse with supine, spicy, tomato based sauces, fried foods. Sxs worse over the last 3-4 months or so.Was previously treated with omeprazole, but more recently changed to Protonix. Sleeps with head elevated.  Chronic cough and dysphagia which occurs immediately after swallowing. EGD with dilation last month. Has been evaluated by Dr. Valeta Harms in the [facility] for chronic cough in 09/2018, with improvement after 2-week trial of Protonix 40 mg bid.   Endoscopic history: -Colonoscopy (08/2019, Dr. 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 Bryan Lemma): 3 cm HH, irregular Z-line (biopsy: Reflux changes without intestinal metaplasia), Gastric hyperplastic polyps. Empiric dilation with 51 FrSavary -CT abdomen/pelvis (09/30/2019): No discrete mass.  No lymphadenopathy. -Colonoscopy (10/02/2019, Dr. Bryan Lemma): EMR of  25 mm polyp in ascending colon (TVA), left-sided diverticulosis, internal hemorrhoids -Admitted January 19-21, 2021 with post polypectomy bleed.  Hemodynamically stable, stable hemoglobin.  No blood products needed -Colonoscopy (10/09/2019, Dr. Carlean Purl): 20 mm ulcer in proximal ascending colon without active bleeding but positive stigmata.  Treated with 11 clips.  Difficult positioning and significant respiratory-dependent motion in colon.  HPI:     Patient is a 79 y.o. male presenting to the [facility] Endo unit for repeat colonoscopy for ongoing surveillance following EMR with piecemeal resection in 09/2019. No current GI sxs.   Review of systems:     No chest pain, no SOB, no fevers, no urinary sx   Past Medical History:  Diagnosis Date  . Anal fissure   . Anxiety   . Arthritis    right wrist; pt. states "everywhere"  . Asthma   . Bipolar disorder ([place])   . Cataract   . Depression   . GERD (gastroesophageal reflux disease)   . Hemorrhage of colon following colonoscopy 10/08/2019  . History of kidney stones   . 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 Hyperlipidemia   . Hypertension    states under control with med., has been on med. x 1 yr.  . Impaired hearing   . Left bundle branch block (LBBB) on electrocardiogram 10/29/2015  . Macular degeneration (senile) of retina    left  . Wears glasses     Patient's surgical history, family medical history, social history, medications and allergies were all reviewed in Epic    Current Facility-Administered Medications  Medication Dose Route Frequency Provider Last Rate Last Admin  . 0.9 %  sodium chloride infusion   Intravenous Continuous Kennidi Yoshida V, DO      . lactated ringers infusion   Intravenous Continuous Logan Vegh V, DO 10 mL/hr at 04/29/20 0806 1,000 mL at 04/29/20 [id number]  Physical Exam:     BP (!) 190/85   Temp 98.4 F (36.9 C) (Oral)   Resp 15   Ht 5\' 10"  (1.778 m)   Wt 117.9 kg   SpO2  97%   BMI 37.31 kg/m   GENERAL:  Pleasant male in NAD PSYCH: : Cooperative, normal affect EENT:  conjunctiva pink, mucous membranes moist, neck supple without masses CARDIAC:  RRR, no murmur heard, no peripheral edema PULM: Normal respiratory effort, lungs CTA bilaterally, no wheezing ABDOMEN:  Nondistended, soft, nontender. 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 Risks include but are not limited to bleeding, perforation, adverse reaction to medications, and cardiopulmonary compromise. Sequelae include but are not limited to the possibility of surgery, hospitalization, and mortality. The patient verbalized understanding and wished to proceed. All questions answered. Further recommendations pending results of the exam.            Lavena Bullion ,DO, FACG 04/29/2020, 8:23 AM

## 2020-04-29 NOTE — Interval H&P Note (Signed)
History and Physical Interval Note:  04/29/2020 8:25 AM  Sean Hill  has presented today for surgery, with the diagnosis of colon polyps.  The various methods of treatment have been discussed with the patient and family. After consideration of risks, benefits and other options for treatment, the patient has consented to  Procedure(s): COLONOSCOPY WITH PROPOFOL (N/A) as a surgical intervention.  The patient's history has been reviewed, patient examined, no change in status, stable for surgery.  I have reviewed the patient's chart and labs.  Questions were answered to the patient's satisfaction.     Dominic Pea Eula Jaster

## 2020-04-29 NOTE — Anesthesia Procedure Notes (Signed)
Date/Time: 04/29/2020 8:27 AM Performed by: Talbot Grumbling, CRNA Oxygen Delivery Method: Simple face mask

## 2020-04-29 NOTE — Op Note (Signed)
[facility] Patient Name: Sean Hill Procedure Date: 04/29/2020 MRN: [id number] Attending MD: Gerrit Heck , MD Date of Birth: September 25, 1940 CSN: [id number] Age: 79 Admit Type: Outpatient Procedure:                Colonoscopy Indications:              Surveillance: History of numerous (> 10) adenomas                            on last colonoscopy (< 3 yrs), Surveillance:                            Personal history of piecemeal removal of adenoma on                            last colonoscopy (less than 1 year ago) Providers:                Gerrit Heck, MD, Wynonia Sours, RN, Faustina                            Mbumina, Technician, Lazaro Arms, Technician Referring MD:              Medicines:                Monitored Anesthesia Care Complications:            No immediate complications. Estimated Blood Loss:     Estimated blood loss was minimal. Procedure:                Pre-Anesthesia Assessment:                           - Prior to the procedure, a History and Physical                            was performed, and patient medications and                            allergies were reviewed. The patient's tolerance of                            previous anesthesia was also reviewed. The risks                            and benefits of the procedure and the sedation                            options and risks were discussed with the patient.                            All questions were answered, and informed consent                            was obtained. Prior Anticoagulants: The patient has  taken no previous anticoagulant or antiplatelet                            agents. ASA Grade Assessment: III - A patient with                            severe systemic disease. After reviewing the risks                            and benefits, the patient was deemed in                            satisfactory condition to undergo the procedure.                            After obtaining informed consent, the colonoscope                            was passed under direct vision. Throughout the                            procedure, the patient's blood pressure, pulse, and                            oxygen saturations were monitored continuously. The                            CF-HQ190L ([id number]) Olympus colonoscope was                            introduced through the anus and advanced to the the                            cecum, identified by appendiceal orifice and                            ileocecal valve. The colonoscopy was performed                            without difficulty. The patient tolerated the                            procedure well. The quality of the bowel                            preparation was good. The ileocecal valve,                            appendiceal orifice, and rectum were photographed. Scope In: 8:32:53 AM Scope Out: 8:57:37 AM Scope Withdrawal Time: 0 hours 21 minutes 27 seconds  Total Procedure Duration: 0 hours 24 minutes 44 seconds  Findings:      The perianal and digital rectal examinations were normal.      A tattoo was seen in the ascending colon.  A single retained clip was noted in the proximal ascending colon. This       appeared to be 1 cm away from the post polypectomy scar site. The       underlying tissue was polypoid in appearance. The lesion was sessile. No       bleeding was present. This clip was deeply adherent, and not removable.       The tissue was biopsied with a cold forceps for histology. Estimated       blood loss was minimal.      A post polypectomy scar was found in the proximal ascending colon. The       scar tissue was healthy in appearance. There was subtle nodularity       surrounding the scar site which was biopsied. There was a separate 2 mm       polyp located 3 cm distal to this site that was also removed with cold       forceps. Estimated blood loss was  minimal.      A 3 mm polyp was found in the sigmoid colon. The polyp was sessile. The       polyp was removed with a cold biopsy forceps. Resection and retrieval       were complete. Estimated blood loss was minimal.      Multiple small and large-mouthed diverticula were found in the sigmoid       colon.      Non-bleeding internal hemorrhoids were found during retroflexion. The       hemorrhoids were small. Impression:               - A tattoo was seen in the ascending colon.                           - Polypoid lesion in the proximal ascending colon.                            Biopsied.                           - Post-polypectomy scar in the proximal ascending                            colon. Biopsied.                           - One 3 mm polyp in the sigmoid colon, removed with                            a cold biopsy forceps. Resected and retrieved.                           - Diverticulosis in the sigmoid colon.                           - Non-bleeding internal hemorrhoids. Moderate Sedation:      Not Applicable - Patient had care per Anesthesia. Recommendation:           - Patient has a contact number available for  emergencies. The signs and symptoms of potential                            delayed complications were discussed with the                            patient. Return to normal activities tomorrow.                            Written discharge instructions were provided to the                            patient.                           - Resume previous diet.                           - Continue present medications.                           - Await pathology results.                           - Repeat colonoscopy for surveillance based on                            pathology results.                           - Return to GI clinic in 3 months. Procedure Code(s):        --- Professional ---                           [phone], Colonoscopy,  flexible; with biopsy, single                            or multiple Diagnosis Code(s):        --- Professional ---                           Z86.010, Personal history of colonic polyps                           K64.8, Other hemorrhoids                           D49.0, Neoplasm of unspecified behavior of                            digestive system                           Z98.890, Other specified postprocedural states                           K63.5, Polyp of colon  Z09, Encounter for follow-up examination after                            completed treatment for conditions other than                            malignant neoplasm                           K57.30, Diverticulosis of large intestine without                            perforation or abscess without bleeding CPT copyright 2019 [employer]. All rights reserved. The codes documented in this report are preliminary and upon coder review may  be revised to meet current compliance requirements. Gerrit Heck, MD 04/29/2020 9:13:11 AM Number of Addenda: 0

## 2020-04-29 NOTE — Discharge Instructions (Signed)
YOU HAD AN ENDOSCOPIC PROCEDURE TODAY: Refer to the procedure report and other information in the discharge instructions given to you for any specific questions about what was found during the examination. If this information does not answer your questions, please call [place] office at [phone] to clarify.  ° °YOU SHOULD EXPECT: Some feelings of bloating in the abdomen. Passage of more gas than usual. Walking can help get rid of the air that was put into your GI tract during the procedure and reduce the bloating. If you had a lower endoscopy (such as a colonoscopy or flexible sigmoidoscopy) you may notice spotting of blood in your stool or on the toilet paper. Some abdominal soreness may be present for a day or two, also. ° °DIET: Your first meal following the procedure should be a light meal and then it is ok to progress to your normal diet. A half-sandwich or bowl of soup is an example of a good first meal. Heavy or fried foods are harder to digest and may make you feel nauseous or bloated. Drink plenty of fluids but you should avoid alcoholic beverages for 24 hours. If you had a esophageal dilation, please see attached instructions for diet.   ° °ACTIVITY: Your care partner should take you home directly after the procedure. You should plan to take it easy, moving slowly for the rest of the day. You can resume normal activity the day after the procedure however YOU SHOULD NOT DRIVE, use power tools, machinery or perform tasks that involve climbing or major physical exertion for 24 hours (because of the sedation medicines used during the test).  ° °SYMPTOMS TO REPORT IMMEDIATELY: °A gastroenterologist can be reached at any hour. Please call [phone]  for any of the following symptoms:  °Following lower endoscopy (colonoscopy, flexible sigmoidoscopy) °Excessive amounts of blood in the stool  °Significant tenderness, worsening of abdominal pains  °Swelling of the abdomen that is new, acute  °Fever of 100° or  higher  °Following upper endoscopy (EGD, EUS, ERCP, esophageal dilation) °Vomiting of blood or coffee ground material  °New, significant abdominal pain  °New, significant chest pain or pain under the shoulder blades  °Painful or persistently difficult swallowing  °New shortness of breath  °Black, tarry-looking or red, bloody stools ° °FOLLOW UP:  °If any biopsies were taken you will be contacted by phone or by letter within the next 1-3 weeks. Call [phone]  if you have not heard about the biopsies in 3 weeks.  °Please also call with any specific questions about appointments or follow up tests. ° °

## 2020-04-29 NOTE — Anesthesia Postprocedure Evaluation (Signed)
Anesthesia Post Note  Patient: Sean Hill  Procedure(s) Performed: COLONOSCOPY WITH PROPOFOL (N/A ) BIOPSY POLYPECTOMY     Patient location during evaluation: PACU Anesthesia Type: MAC Level of consciousness: awake and alert Pain management: pain level controlled Vital Signs Assessment: post-procedure vital signs reviewed and stable Respiratory status: spontaneous breathing Cardiovascular status: stable Anesthetic complications: no   No complications documented.  Last Vitals:  Vitals:   04/29/20 0910 04/29/20 0920  BP: 140/60 (!) 144/56  Pulse: 88 80  Resp: (!) 21 17  Temp:    SpO2: 93% 94%    Last Pain:  Vitals:   04/29/20 0920  TempSrc:   PainSc: 0-No pain                 Nolon Nations

## 2020-04-29 NOTE — Transfer of Care (Signed)
Immediate Anesthesia Transfer of Care Note  Patient: Sean Hill  Procedure(s) Performed: COLONOSCOPY WITH PROPOFOL (N/A ) BIOPSY POLYPECTOMY  Patient Location: PACU  Anesthesia Type:MAC  Level of Consciousness: sedated  Airway & Oxygen Therapy: Patient Spontanous Breathing and Patient connected to face mask oxygen  Post-op Assessment: Report given to RN and Post -op Vital signs reviewed and stable  Post vital signs: Reviewed and stable  Last Vitals:  Vitals Value Taken Time  BP    Temp    Pulse    Resp 14 04/29/20 0904  SpO2    Vitals shown include unvalidated device data.  Last Pain:  Vitals:   04/29/20 0804  TempSrc: Oral  PainSc: 0-No pain         Complications: No complications documented.

## 2020-04-30 ENCOUNTER — Encounter (HOSPITAL_COMMUNITY): Payer: Self-pay | Admitting: Gastroenterology

## 2020-04-30 LAB — SURGICAL PATHOLOGY

## 2020-05-01 DIAGNOSIS — H5203 Hypermetropia, bilateral: Secondary | ICD-10-CM | POA: Diagnosis not present

## 2020-05-01 DIAGNOSIS — H524 Presbyopia: Secondary | ICD-10-CM | POA: Diagnosis not present

## 2020-05-01 DIAGNOSIS — H52223 Regular astigmatism, bilateral: Secondary | ICD-10-CM | POA: Diagnosis not present

## 2020-05-11 DIAGNOSIS — Z0100 Encounter for examination of eyes and vision without abnormal findings: Secondary | ICD-10-CM | POA: Diagnosis not present

## 2020-05-12 ENCOUNTER — Telehealth (HOSPITAL_COMMUNITY): Payer: Self-pay | Admitting: *Deleted

## 2020-05-12 NOTE — Telephone Encounter (Signed)
This nurse called pt to remind him of his Abilify Maintena injection coming due next week. Writer transferred pt to front desk for appointment.

## 2020-05-14 DIAGNOSIS — F411 Generalized anxiety disorder: Secondary | ICD-10-CM | POA: Diagnosis not present

## 2020-05-14 DIAGNOSIS — R69 Illness, unspecified: Secondary | ICD-10-CM | POA: Diagnosis not present

## 2020-05-15 ENCOUNTER — Ambulatory Visit (HOSPITAL_COMMUNITY): Payer: Medicare HMO

## 2020-05-15 ENCOUNTER — Encounter (HOSPITAL_COMMUNITY): Payer: Self-pay

## 2020-05-15 ENCOUNTER — Other Ambulatory Visit: Payer: Self-pay

## 2020-05-15 NOTE — Progress Notes (Unsigned)
Pt presents today for due monthly injection of Abilify Maintena 400mg . Pt pleasant and cooperative. Affect congruent. Injection given in right upper outer quadrant without c/o. Pt is wearing an eye patch over L eye due to recent onset of "double vision", otherwise no c/o. Pt to return in approximately 4 weeks for next due injection. Patient systoloic bp was on the high side; however, pt stated that he just started bp medication a few days ago

## 2020-05-27 DIAGNOSIS — H353211 Exudative age-related macular degeneration, right eye, with active choroidal neovascularization: Secondary | ICD-10-CM | POA: Diagnosis not present

## 2020-05-27 DIAGNOSIS — H353222 Exudative age-related macular degeneration, left eye, with inactive choroidal neovascularization: Secondary | ICD-10-CM | POA: Diagnosis not present

## 2020-06-02 ENCOUNTER — Telehealth: Payer: Self-pay | Admitting: Pharmacist

## 2020-06-02 ENCOUNTER — Other Ambulatory Visit: Payer: Self-pay | Admitting: Adult Health

## 2020-06-02 DIAGNOSIS — R059 Cough, unspecified: Secondary | ICD-10-CM

## 2020-06-02 DIAGNOSIS — I10 Essential (primary) hypertension: Secondary | ICD-10-CM

## 2020-06-02 MED ORDER — LISINOPRIL 20 MG PO TABS: 20.0000 mg | ORAL_TABLET | Freq: Every day | ORAL | 3 refills | Status: DC

## 2020-06-02 MED ORDER — ALBUTEROL SULFATE HFA 108 (90 BASE) MCG/ACT IN AERS: 2.0000 | INHALATION_SPRAY | Freq: Four times a day (QID) | RESPIRATORY_TRACT | 3 refills | Status: DC | PRN

## 2020-06-02 MED ORDER — EZETIMIBE 10 MG PO TABS
10.0000 mg | ORAL_TABLET | Freq: Every day | ORAL | 3 refills | Status: DC
Start: 2020-06-02 — End: 2021-05-03

## 2020-06-02 NOTE — Chronic Care Management (AMB) (Signed)
  Chronic Care Management   Outreach Note  06/02/2020 Name: Sean Hill  MRN: [id number]       DOB: 10/23/1940  Referred by: [name], NP Reason for referral : No chief complaint on file.  BP Readings from Last 3 Encounters:  05/15/20 (!) 146/77  04/29/20 (!) 144/56  04/17/20 (!) 161/83   Lab Results  Component Value Date   HGBA1C 6.0 12/25/2019   Verbal consent obtained for [facility] enhanced pharmacy services (medication synchronization, adherence packaging, delivery coordination). A medication sync plan was created to allow patient to get all medications delivered once every 30 to 90 days per patient preference. Patient understands they have freedom to choose pharmacy and clinical pharmacist will coordinate care between all prescribers and [facility].  Coordinated medications delivery with pharmacy as seen below.  Patient requested to obtain medications through Adherence Packaging  90 Days   Medication Name Last Fill Date & Day Supply  Anticipated next due date  Format: MM/DD/YY      albuterol (VENTOLIN HFA) 108 (90 Base) MCG/ACT inhaler   PRN Patient will call when needed  benztropine (COGENTIN) 1 MG tablet - 1 tablet daily 03/24/2020 #90 (40 on hand) 07/07/2020  citalopram (CELEXA) 20 MG tablet - 1 tablet daily 03/24/2020# 90(40 on hand) 07/07/2020  fluticasone (FLONASE) 50 MCG/ACT nasal spray  PRN Patient will call when needed  lisinopril (ZESTRIL) 20 MG tablet - 1 tablet daily 04/11/2020 #90 (70 on hand) 08/06/2020  pantoprazole (PROTONIX) 40 MG - 1 tablet twice daily  09/08/2020 #180 (68 on hand) 08/04/2020  temazepam (RESTORIL) 30 MG - 1 capsule at bedtime  03/24/2020 # 90 (40 on hand) 07/07/2020  Trazodone (DESYREL) 50 MG tablet - 1 tablet daily  03/24/2020 # 90 (40 on hand) 07/07/2020  Multiple Vitamin (MULTIVITAMIN WITH MINERALS) - 1 tablet daily OTC Fill with adherence packaging  Multiple Vitamins-Minerals (PRESERVISION AREDS 2 PO)  - 1 tablet twice daily OTC Fill with adherence packaging  Omega-3 Fatty Acids 1000 mg - 1 capsule daily  OTC Fill with adherence packaging  Vitamin D, Cholecalciferol, 1000 UNITS TABS - 1 tablet daily OTC Fill with adherence packaging  Chlorpheniramine 4 mg - 1 tablet daily  OTC Fill with adherence packaging    Patient will need a short fill of benztropine, citalopram, temazepam, trazodone, prior to adherence delivery to align with sync date.  Prescriptions requested from PCP and specialists.  Jeni Salles, PharmD Clinical Pharmacist [place]

## 2020-06-02 NOTE — Telephone Encounter (Signed)
Hi Maddie,   I have sent the request medications to [facility].   Let me know if you need anything else  - Tommi Rumps

## 2020-06-04 DIAGNOSIS — R69 Illness, unspecified: Secondary | ICD-10-CM | POA: Diagnosis not present

## 2020-06-04 DIAGNOSIS — F411 Generalized anxiety disorder: Secondary | ICD-10-CM | POA: Diagnosis not present

## 2020-06-11 DIAGNOSIS — H5021 Vertical strabismus, right eye: Secondary | ICD-10-CM | POA: Diagnosis not present

## 2020-06-11 DIAGNOSIS — H50411 Cyclotropia, right eye: Secondary | ICD-10-CM | POA: Diagnosis not present

## 2020-06-12 DIAGNOSIS — H5021 Vertical strabismus, right eye: Secondary | ICD-10-CM | POA: Diagnosis not present

## 2020-06-12 DIAGNOSIS — Z7689 Persons encountering health services in other specified circumstances: Secondary | ICD-10-CM | POA: Diagnosis not present

## 2020-06-16 DIAGNOSIS — R69 Illness, unspecified: Secondary | ICD-10-CM | POA: Diagnosis not present

## 2020-06-18 ENCOUNTER — Encounter (HOSPITAL_COMMUNITY): Payer: Self-pay

## 2020-06-18 ENCOUNTER — Ambulatory Visit (HOSPITAL_COMMUNITY): Payer: Medicare HMO

## 2020-06-18 ENCOUNTER — Other Ambulatory Visit: Payer: Self-pay

## 2020-06-18 VITALS — BP 136/74 | HR 78 | Temp 97.5°F | Ht 69.0 in | Wt 258.8 lb

## 2020-06-18 DIAGNOSIS — F319 Bipolar disorder, unspecified: Secondary | ICD-10-CM

## 2020-06-18 NOTE — Progress Notes (Signed)
Pt presents today for due monthly injection of Abilify Maintena 400mg . Pt pleasant and cooperative. Affect congruent. Injection given in right upper outer quadrant without c/o. Pt to return in approximately 4 weeks for next due injection

## 2020-06-19 ENCOUNTER — Telehealth (INDEPENDENT_AMBULATORY_CARE_PROVIDER_SITE_OTHER): Payer: Medicare HMO | Admitting: Adult Health

## 2020-06-19 ENCOUNTER — Other Ambulatory Visit: Payer: Self-pay

## 2020-06-19 DIAGNOSIS — I10 Essential (primary) hypertension: Secondary | ICD-10-CM

## 2020-06-19 DIAGNOSIS — T50905A Adverse effect of unspecified drugs, medicaments and biological substances, initial encounter: Secondary | ICD-10-CM | POA: Diagnosis not present

## 2020-06-19 MED ORDER — LOSARTAN POTASSIUM 50 MG PO TABS: 50.0000 mg | ORAL_TABLET | Freq: Every day | ORAL | 0 refills | Status: DC

## 2020-06-19 NOTE — Progress Notes (Signed)
Virtual Visit via Video Note  I connected with Sean Hill on 06/19/20 at  3:30 PM EDT by a video enabled telemedicine application and verified that I am speaking with the correct person using two identifiers.  Location patient: home Location provider:work or home office Persons participating in the virtual visit: patient, provider  I discussed the limitations of evaluation and management by telemedicine and the availability of in person appointments. The patient expressed understanding and agreed to proceed.   HPI: 79 year old male recently had dose change of lisinopril from 20 mg to 40 mg for tighter blood pressure control.  He reports that since increasing the lisinopril he has been suffering from a dry cough, has increased bloating, flatulence, and diarrhea.   ROS: See pertinent positives and negatives per HPI.  Past Medical History:  Diagnosis Date   Anal fissure    Anxiety    Arthritis    right wrist; pt. states "everywhere"   Asthma    Bipolar disorder ([place])    Cataract    Depression    GERD (gastroesophageal reflux disease)    Hemorrhage of colon following colonoscopy 10/08/2019   History of kidney stones    History of MRSA infection    left knee after arthroplasty   Hyperlipidemia    Hypertension    states under control with med., has been on med. x 1 yr.   Impaired hearing    Left bundle branch block (LBBB) on electrocardiogram 10/29/2015   Macular degeneration (senile) of retina    left   Wears glasses     Past Surgical History:  Procedure Laterality Date   ANKLE FUSION Bilateral    ANKLE SURGERY Bilateral    ligament surgery   BIOPSY  04/29/2020   Procedure: BIOPSY;  Surgeon: Lavena Bullion, DO;  Location: [facility];  Service: Gastroenterology;;   CARDIAC CATHETERIZATION  x 2   1983; 11/14/2003   CARPOMETACARPEL SUSPENSION PLASTY Right 09/22/2016   Procedure: right thumb carpometacarpal  ARTHROPLASTY;  Surgeon: Leanora Cover,  MD;  Location: [facility];  Service: Orthopedics;  Laterality: Right;  right thumb carpometacarpal  ARTHROPLASTY   CARPOMETACARPEL SUSPENSION PLASTY Right 11/02/2017   Procedure: RIGHT THUMB SUSPENSIONPLASTY WITH TIGHTROPE, DISTAL POLE SCAPHOID  EXCISION;  Surgeon: Leanora Cover, MD;  Location: [facility];  Service: Orthopedics;  Laterality: Right;   COLONOSCOPY WITH PROPOFOL N/A 10/02/2019   Procedure: COLONOSCOPY WITH PROPOFOL;  Surgeon: Lavena Bullion, DO;  Location: [facility];  Service: Gastroenterology;  Laterality: N/A;   COLONOSCOPY WITH PROPOFOL N/A 10/09/2019   Procedure: COLONOSCOPY WITH PROPOFOL;  Surgeon: Gatha Mayer, MD;  Location: [facility];  Service: Endoscopy;  Laterality: N/A;   COLONOSCOPY WITH PROPOFOL N/A 04/29/2020   Procedure: COLONOSCOPY WITH PROPOFOL;  Surgeon: Lavena Bullion, DO;  Location: [facility];  Service: Gastroenterology;  Laterality: N/A;   ELBOW SURGERY Left    ENDOSCOPIC MUCOSAL RESECTION N/A 10/02/2019   Procedure: ENDOSCOPIC MUCOSAL RESECTION;  Surgeon: Lavena Bullion, DO;  Location: [facility];  Service: Gastroenterology;  Laterality: N/A;   HEMOSTASIS CLIP PLACEMENT  10/09/2019   Procedure: HEMOSTASIS CLIP PLACEMENT;  Surgeon: Gatha Mayer, MD;  Location: [facility];  Service: Endoscopy;;   INGUINAL HERNIA REPAIR Right    OPEN REDUCTION INTERNAL FIXATION (ORIF) DISTAL RADIAL FRACTURE Right 10/29/2015   Procedure: OPEN REDUCTION INTERNAL FIXATION (ORIF) DISTAL RADIAL FRACTURE;  Surgeon: Leanora Cover, MD;  Location: [facility];  Service: Orthopedics;  Laterality: Right;   ORIF  ELBOW FRACTURE Right    POLYPECTOMY  04/29/2020   Procedure: POLYPECTOMY;  Surgeon: Lavena Bullion, DO;  Location: [facility];  Service: Gastroenterology;;   SUBMUCOSAL LIFTING INJECTION  10/02/2019   Procedure: SUBMUCOSAL LIFTING INJECTION;  Surgeon: Lavena Bullion, DO;  Location: [facility];   Service: Gastroenterology;;   TONSILLECTOMY     TOTAL KNEE ARTHROPLASTY Bilateral    ULNAR COLLATERAL LIGAMENT REPAIR Right 12/29/2015   Procedure: REPAIR  RIGHT LATERAL ULNAR COLLATERAL LIGAMENT TEAR  EXTENSOR ORIGIN ;  Surgeon: Leanora Cover, MD;  Location: [facility];  Service: Orthopedics;  Laterality: Right;   WRIST ARTHROSCOPY WITH DEBRIDEMENT Right 07/14/2016   Procedure: RIGHT WRIST ARTHROSCOPY WITH DEBRIDEMENT  TRIANGULAR FIBROCARTILAGE COMPLEX;  Surgeon: Leanora Cover, MD;  Location: [facility];  Service: Orthopedics;  Laterality: Right;    Family History  Problem Relation Age of Onset   Alcohol abuse Mother    Ovarian cancer Mother    Alcohol abuse Father    Pancreatic cancer Father    Depression Daughter    Paranoid behavior Daughter    Hyperlipidemia Brother    Barrett's esophagus Brother    Colon cancer Maternal Grandmother    Esophageal cancer Neg Hx    Stomach cancer Neg Hx    Rectal cancer Neg Hx        Current Outpatient Medications:    albuterol (VENTOLIN HFA) 108 (90 Base) MCG/ACT inhaler, Inhale 2 puffs into the lungs every 6 (six) hours as needed for wheezing or shortness of breath., Disp: 6.7 g, Rfl: 3   Alum Hydroxide-Mag Carbonate (GAVISCON PO), Take 1 tablet by mouth 3 (three) times daily as needed (heartburn).  (Patient not taking: Reported on 04/27/2020), Disp: , Rfl:    ARIPiprazole ER (ABILIFY MAINTENA) 400 MG PRSY prefilled syringe, Inject 400 mg into the muscle every 28 (twenty-eight) days., Disp: 1 each, Rfl: 2   benztropine (COGENTIN) 1 MG tablet, Take 1 tablet (1 mg total) by mouth daily., Disp: 90 tablet, Rfl: 0   citalopram (CELEXA) 20 MG tablet, Take 1 tablet (20 mg total) by mouth daily., Disp: 90 tablet, Rfl: 0   ezetimibe (ZETIA) 10 MG tablet, Take 1 tablet (10 mg total) by mouth daily., Disp: 90 tablet, Rfl: 3   fluticasone (FLONASE) 50 MCG/ACT nasal spray, Place 1 spray into both nostrils in  the morning and at bedtime., Disp: , Rfl:    lisinopril (ZESTRIL) 20 MG tablet, Take 1 tablet (20 mg total) by mouth daily., Disp: 90 tablet, Rfl: 3   Multiple Vitamin (MULTIVITAMIN WITH MINERALS) TABS tablet, Take 1 tablet by mouth daily., Disp: , Rfl:    Multiple Vitamins-Minerals (PRESERVISION AREDS 2 PO), Take 1 capsule by mouth 2 (two) times daily., Disp: , Rfl:    Omega-3 Fatty Acids (FISH OIL PO), Take 1 capsule by mouth daily. , Disp: , Rfl:    oxymetazoline (AFRIN) 0.05 % nasal spray, Place 1 spray into both nostrils 2 (two) times daily as needed for congestion. (Patient not taking: Reported on 04/22/2020), Disp: , Rfl:    pantoprazole (PROTONIX) 40 MG tablet, Take 1 tablet (40 mg total) by mouth 2 (two) times daily., Disp: 180 tablet, Rfl: 3   temazepam (RESTORIL) 30 MG capsule, Take 1 capsule (30 mg total) by mouth at bedtime., Disp: 90 capsule, Rfl: 0   traZODone (DESYREL) 50 MG tablet, Take 1 tablet (50 mg total) by mouth at bedtime., Disp: 90 tablet, Rfl: 0   Vitamin D, Cholecalciferol, 1000  UNITS TABS, Take 1,000 Units by mouth daily. , Disp: , Rfl:   Current Facility-Administered Medications:    ARIPiprazole ER (ABILIFY MAINTENA) 400 MG prefilled syringe 400 mg, 400 mg, Intramuscular, Q28 days, Arfeen, Arlyce Harman, MD, 400 mg at 06/18/20 1206  EXAM:  VITALS per patient if applicable:  GENERAL: alert, oriented, appears well and in no acute distress  HEENT: atraumatic, conjunttiva clear, no obvious abnormalities on inspection of external nose and ears  NECK: normal movements of the head and neck  LUNGS: on inspection no signs of respiratory distress, breathing rate appears normal, no obvious gross SOB, gasping or wheezing  CV: no obvious cyanosis  MS: moves all visible extremities without noticeable abnormality  PSYCH/NEURO: pleasant and cooperative, no obvious depression or anxiety, speech and thought processing grossly intact  ASSESSMENT AND PLAN:  Discussed the  following assessment and plan:  1. Essential (primary) hypertension  - losartan (COZAAR) 50 MG tablet; Take 1 tablet (50 mg total) by mouth daily.  Dispense: 90 tablet; Refill: 0 -He was advised to monitor his blood pressure at home and report back to me via MyChart in the next 2 weeks with blood pressure readings  2. Adverse effect of drug, initial encounter -Possible side effects from lisinopril.  Lisinopril DC'd and switched to Cozaar.     I discussed the assessment and treatment plan with the patient. The patient was provided an opportunity to ask questions and all were answered. The patient agreed with the plan and demonstrated an understanding of the instructions.   The patient was advised to call back or seek an in-person evaluation if the symptoms worsen or if the condition fails to improve as anticipated.   [name], NP

## 2020-06-20 ENCOUNTER — Other Ambulatory Visit (HOSPITAL_COMMUNITY): Payer: Self-pay | Admitting: Psychiatry

## 2020-06-20 DIAGNOSIS — F319 Bipolar disorder, unspecified: Secondary | ICD-10-CM

## 2020-06-21 ENCOUNTER — Other Ambulatory Visit (HOSPITAL_COMMUNITY): Payer: Self-pay | Admitting: Psychiatry

## 2020-06-21 DIAGNOSIS — F319 Bipolar disorder, unspecified: Secondary | ICD-10-CM

## 2020-06-21 DIAGNOSIS — F411 Generalized anxiety disorder: Secondary | ICD-10-CM

## 2020-06-24 ENCOUNTER — Telehealth (HOSPITAL_COMMUNITY): Payer: Medicare HMO | Admitting: Psychiatry

## 2020-06-24 DIAGNOSIS — H353211 Exudative age-related macular degeneration, right eye, with active choroidal neovascularization: Secondary | ICD-10-CM | POA: Diagnosis not present

## 2020-06-24 DIAGNOSIS — H353222 Exudative age-related macular degeneration, left eye, with inactive choroidal neovascularization: Secondary | ICD-10-CM | POA: Diagnosis not present

## 2020-06-25 ENCOUNTER — Telehealth (INDEPENDENT_AMBULATORY_CARE_PROVIDER_SITE_OTHER): Payer: Medicare HMO | Admitting: Psychiatry

## 2020-06-25 ENCOUNTER — Other Ambulatory Visit: Payer: Self-pay

## 2020-06-25 ENCOUNTER — Encounter (HOSPITAL_COMMUNITY): Payer: Self-pay | Admitting: Psychiatry

## 2020-06-25 DIAGNOSIS — R69 Illness, unspecified: Secondary | ICD-10-CM | POA: Diagnosis not present

## 2020-06-25 DIAGNOSIS — F319 Bipolar disorder, unspecified: Secondary | ICD-10-CM | POA: Diagnosis not present

## 2020-06-25 DIAGNOSIS — F411 Generalized anxiety disorder: Secondary | ICD-10-CM | POA: Diagnosis not present

## 2020-06-25 MED ORDER — TRAZODONE HCL 50 MG PO TABS: 50.0000 mg | ORAL_TABLET | Freq: Every day | ORAL | 0 refills | Status: DC

## 2020-06-25 MED ORDER — BENZTROPINE MESYLATE 1 MG PO TABS: 1.0000 mg | ORAL_TABLET | Freq: Every day | ORAL | 0 refills | Status: DC

## 2020-06-25 MED ORDER — TEMAZEPAM 30 MG PO CAPS: 30.0000 mg | ORAL_CAPSULE | Freq: Every day | ORAL | 0 refills | Status: DC

## 2020-06-25 MED ORDER — CITALOPRAM HYDROBROMIDE 20 MG PO TABS: 20.0000 mg | ORAL_TABLET | Freq: Every day | ORAL | 0 refills | Status: DC

## 2020-06-25 MED ORDER — ARIPIPRAZOLE ER 400 MG IM PRSY: 400.0000 mg | PREFILLED_SYRINGE | INTRAMUSCULAR | 2 refills | Status: DC

## 2020-06-25 NOTE — Progress Notes (Signed)
Virtual Visit via Telephone Note  I connected with Sean Hill on 06/25/20 at  2:20 PM EDT by telephone and verified that I am speaking with the correct person using two identifiers.  Location: Patient: home Provider: home office   I discussed the limitations, risks, security and privacy concerns of performing an evaluation and management service by telephone and the availability of in person appointments. I also discussed with the patient that there may be a patient responsible charge related to this service. The patient expressed understanding and agreed to proceed.   History of Present Illness: Patient is evaluated by phone session.  He is taking his medication as prescribed.  He is in therapy with [name] once a month and that helping him a lot.  He had a good summer.  He went to [place] with the family and had a good time.  He has chronic tremors but does not interfere in his daily activities.  He feels the current combination is working well and he does not want to change.  He denies any mood swings, irritability, mania or any psychosis.  Denies any panic attack and he feels his anxiety is under control with the medication.  His appetite is okay.  His energy level is fair.  He is compliant with Abilify injection.  He lives with his wife who is supportive.  Past Psychiatric History: H/Omultiple hospitalization. Last hospitalization in July 2014.H/Ooverdose onLatudawith alcohol.H/Ocutting wrist. Took Tegretol, Depakote, Ambien, Remeron, Vistaril, Geodon, Abilify, lithium, Provigil, Zoloft, Neurontin, Lamictal, Wellbutrin, Risperdal and Pristiq. H/Omania,aggression, getting speeding tickets, excessive buying and impulsive behavior.   Psychiatric Specialty Exam: Physical Exam  Review of Systems  Weight 258 lb (117 kg).There is no height or weight on file to calculate BMI.  General Appearance: NA  Eye Contact:  NA  Speech:  Slow  Volume:  Normal  Mood:  Euthymic  Affect:   NA  Thought Process:  Goal Directed  Orientation:  Full (Time, Place, and Person)  Thought Content:  Logical  Suicidal Thoughts:  No  Homicidal Thoughts:  No  Memory:  Immediate;   Good Recent;   Good Remote;   Fair  Judgement:  Intact  Insight:  Present  Psychomotor Activity:  Tremor  Concentration:  Concentration: Fair and Attention Span: Fair  Recall:  Good  Fund of Knowledge:  Good  Language:  Good  Akathisia:  No  Handed:  Right  AIMS (if indicated):     Assets:  Communication Skills Desire for Improvement Resilience Social Support  ADL's:  Intact  Cognition:  WNL  Sleep:   good      Assessment and Plan: Bipolar disorder type I.  Generalized anxiety disorder.  Patient is a stable on his current medication.  He does not want to change the medication.  He has mild tremors but does not interfere in his daily activities.  Continue Cogentin 1 mg at bedtime, Celexa 20 mg daily, Abilify injection 400 mg intramuscular every 28 days, trazodone 50 mg at bedtime and temazepam 30 mg at bedtime.  Discussed medication side effects and benefits.  Encouraged to continue therapy with Santa Lighter.  Follow-up in 3 months.  Follow Up Instructions:    I discussed the assessment and treatment plan with the patient. The patient was provided an opportunity to ask questions and all were answered. The patient agreed with the plan and demonstrated an understanding of the instructions.   The patient was advised to call back or seek an in-person evaluation if the  symptoms worsen or if the condition fails to improve as anticipated.  I provided 17 minutes of non-face-to-face time during this encounter.   [name], MD

## 2020-06-29 ENCOUNTER — Encounter: Payer: Self-pay | Admitting: Adult Health

## 2020-07-02 DIAGNOSIS — R69 Illness, unspecified: Secondary | ICD-10-CM | POA: Diagnosis not present

## 2020-07-02 DIAGNOSIS — F411 Generalized anxiety disorder: Secondary | ICD-10-CM | POA: Diagnosis not present

## 2020-07-16 ENCOUNTER — Telehealth (HOSPITAL_COMMUNITY): Payer: Self-pay | Admitting: *Deleted

## 2020-07-16 NOTE — Telephone Encounter (Signed)
Writer left VM for pt reminding him he is due for monthly Abilify Maintena injection.

## 2020-07-17 ENCOUNTER — Telehealth: Payer: Self-pay | Admitting: Pharmacist

## 2020-07-17 DIAGNOSIS — I10 Essential (primary) hypertension: Secondary | ICD-10-CM

## 2020-07-17 DIAGNOSIS — E7849 Other hyperlipidemia: Secondary | ICD-10-CM

## 2020-07-17 NOTE — Chronic Care Management (AMB) (Addendum)
Chronic Care Management Pharmacy Assistant   Name: SHAQUILLE JANES  MRN: [id number] DOB: 1941/07/30  Reason for Encounter: Disease State and Medication Review  Patient Questions:  1.  Have you seen any other providers since your last visit? Yes, Office Visit (video) 06-19-2020 [name], NP  2.  Any changes in your medicines or health? Yes, Discontinued Lisinopril 20 mg and Started Losartan 50 mg tablets   PCP : [name], NP  Allergies:   Allergies  Allergen Reactions  . Penicillins Other (See Comments)    BLISTERS Did it involve swelling of the face/tongue/throat, SOB, or low BP? No Did it involve sudden or severe rash/hives, skin peeling, or any reaction on the inside of your mouth or nose? No Did you need to seek medical attention at a hospital or doctor's office? No When did it last happen?around 1980 If all above answers are "NO", may proceed with cephalosporin use.   Marland Kitchen Lisinopril     Dry cough, abdominal bloating and diarrhea  . Codeine Rash    Medications: Outpatient Encounter Medications as of 07/17/2020  Medication Sig  . albuterol (VENTOLIN HFA) 108 (90 Base) MCG/ACT inhaler Inhale 2 puffs into the lungs every 6 (six) hours as needed for wheezing or shortness of breath.  . ARIPiprazole ER (ABILIFY MAINTENA) 400 MG PRSY prefilled syringe Inject 400 mg into the muscle every 28 (twenty-eight) days.  . benztropine (COGENTIN) 1 MG tablet Take 1 tablet (1 mg total) by mouth daily.  . citalopram (CELEXA) 20 MG tablet Take 1 tablet (20 mg total) by mouth daily.  Marland Kitchen ezetimibe (ZETIA) 10 MG tablet Take 1 tablet (10 mg total) by mouth daily.  . fluticasone (FLONASE) 50 MCG/ACT nasal spray Place 1 spray into both nostrils in the morning and at bedtime.  Marland Kitchen losartan (COZAAR) 50 MG tablet Take 1 tablet (50 mg total) by mouth daily.  . Multiple Vitamin (MULTIVITAMIN WITH MINERALS) TABS tablet Take 1 tablet by mouth daily.  . Multiple Vitamins-Minerals  (PRESERVISION AREDS 2 PO) Take 1 capsule by mouth 2 (two) times daily.  . Omega-3 Fatty Acids (FISH OIL PO) Take 1 capsule by mouth daily.   . pantoprazole (PROTONIX) 40 MG tablet Take 1 tablet (40 mg total) by mouth 2 (two) times daily.  . temazepam (RESTORIL) 30 MG capsule Take 1 capsule (30 mg total) by mouth at bedtime.  . traZODone (DESYREL) 50 MG tablet Take 1 tablet (50 mg total) by mouth at bedtime.  . Vitamin D, Cholecalciferol, 1000 UNITS TABS Take 1,000 Units by mouth daily.    Facility-Administered Encounter Medications as of 07/17/2020  Medication  . ARIPiprazole ER (ABILIFY MAINTENA) 400 MG prefilled syringe 400 mg    Current Diagnosis: Patient Active Problem List   Diagnosis Date Noted  . Bipolar affective disorder, currently depressed, mild ([place]) 10/21/2019  . Hemorrhage of colon following colonoscopy 10/08/2019  . Adenomatous polyp of ascending colon   . Tubulovillous adenoma of colon   . History of colonic polyps   . Diverticulosis of colon without hemorrhage   . Grade I internal hemorrhoids   . Essential hypertension 08/23/2016  . Left bundle branch block (LBBB) on electrocardiogram 06/10/2016  . Hyperlipidemia 06/07/2016  . Left leg swelling 01/06/2015  . Change in bowel habits 01/06/2015  . Gastroesophageal reflux disease 11/28/2011  . Bipolar disorder ([place]) 11/25/2011    Goals Addressed   None    Reviewed chart for medication changes ahead of medication coordination call.  Reviewed  OVs, Consults, or hospital visits since last care coordination call/Pharmacist visit.  Reviewed  medication changes indicated.   Lab Results  Component Value Date   HGBA1C 6.0 12/25/2019     Patient obtains medications through Adherence Packaging  90 Days   Last adherence delivery included:  Marland Kitchen Citalopram (CELEXA) 20 mg: one tablet at breakfast . Trazodone (DESYREL) 50 mg: one capsule at bedtime . Benztropine (COGENTIN) 1 mg: one tablet at lunch . Ezetimibe (ZETIA)  10 mg: one tablet at lunch . Temazepam (RESTORIL) 30 mg: one capsule at bedtime  Patient is due for next adherence delivery on: 07-24-2020. Called patient and reviewed medications and coordinated delivery.  This delivery to include: . Citalopram (CELEXA) 20 mg: one tablet at breakfast . Trazodone (DESYREL) 50 mg: one capsule at bedtime . Benztropine (COGENTIN) 1 mg: one tablet at lunch . Ezetimibe (ZETIA) 10 mg: one tablet at lunch . Temazepam (RESTORIL) 30 mg: one capsule at bedtime . Chlorpheniramine 4 mg . Omega-3 Fatty Acids 1,000 Mg Capsule . Preservision Areds-2g . Men's 50 plus multivitamin: one tablet at breakfast . Vitamin D3 25 Mcg: one tablet at breakfast  Patient declined the following medications  . Albuterol Aer Hfa (PRN) . Tobramycin 0.3% eyedrops (PRN)  Refills needed: Temazepam, trazodone, benztropine, citalopram  Confirmed delivery date of 07-24-2020, advised patient that pharmacy will contact them the morning of delivery.  Follow-Up:  Coordination of Enhanced Pharmacy Services and Pharmacist Review   Reviewed chart prior to disease state call. Spoke with patient regarding BP  Recent Office Vitals: BP Readings from Last 3 Encounters:  04/29/20 (!) 144/56  02/21/20 (!) 146/74  12/25/19 130/76   Pulse Readings from Last 3 Encounters:  04/29/20 80  10/24/19 82  10/10/19 70    Wt Readings from Last 3 Encounters:  04/29/20 260 lb (117.9 kg)  02/21/20 258 lb (117 kg)  12/25/19 254 lb (115.2 kg)     Kidney Function Lab Results  Component Value Date/Time   CREATININE 1.19 12/25/2019 08:23 AM   CREATININE 1.10 10/09/2019 05:00 AM   CREATININE 1.00 09/24/2019 12:21 PM   CREATININE 0.88 12/03/2014 03:47 PM   GFR 59.27 (L) 12/25/2019 08:23 AM   GFRNONAA >60 10/09/2019 05:00 AM   GFRNONAA 73 09/24/2019 12:21 PM   GFRAA >60 10/09/2019 05:00 AM   GFRAA 84 09/24/2019 12:21 PM    BMP Latest Ref Rng & Units 12/25/2019 10/09/2019 10/08/2019  Glucose 70 - 99  mg/dL 111(H) 113(H) 123(H)  BUN 6 - 23 mg/dL 18 17 17   Creatinine 0.40 - 1.50 mg/dL 1.19 1.10 1.19  BUN/Creat Ratio 6 - 22 (calc) - - -  Sodium 135 - 145 mEq/L 140 140 141  Potassium 3.5 - 5.1 mEq/L 4.7 3.9 3.7  Chloride 96 - 112 mEq/L 104 109 109  CO2 19 - 32 mEq/L 28 22 23   Calcium 8.4 - 10.5 mg/dL 9.7 9.0 9.1    . Current antihypertensive regimen:  o Losartan 50 mg tablet daily . How often are you checking your Blood Pressure? 1-2x per week . Current home BP readings: 136/70 . What recent interventions/DTPs have been made by any provider to improve Blood Pressure control since last CPP Visit: D/C Lisinopril and start Losartan . Any recent hospitalizations or ED visits since last visit with CPP? No . What diet changes have been made to improve Blood Pressure Control?  o He states he cut back on red meat and salt in his diet. . What exercise is being done  to improve your Blood Pressure Control?  o He  states he walks about  3-4 times a week.  Adherence Review: Is the patient currently on ACE/ARB medication? Yes Does the patient have >5 day gap between last estimated fill dates? No  Maia Breslow, [place] Assistant [phone]

## 2020-07-20 ENCOUNTER — Ambulatory Visit (INDEPENDENT_AMBULATORY_CARE_PROVIDER_SITE_OTHER): Payer: Medicare HMO | Admitting: *Deleted

## 2020-07-20 ENCOUNTER — Other Ambulatory Visit: Payer: Self-pay

## 2020-07-20 ENCOUNTER — Encounter (HOSPITAL_COMMUNITY): Payer: Self-pay | Admitting: *Deleted

## 2020-07-20 VITALS — BP 132/73 | HR 76 | Resp 20 | Ht 70.0 in | Wt 262.4 lb

## 2020-07-20 DIAGNOSIS — F319 Bipolar disorder, unspecified: Secondary | ICD-10-CM | POA: Diagnosis not present

## 2020-07-20 DIAGNOSIS — R69 Illness, unspecified: Secondary | ICD-10-CM | POA: Diagnosis not present

## 2020-07-20 NOTE — Progress Notes (Signed)
Pt in clinic today for due Abilify Maintena 400mg  injection. Pt appropriate, pleasant and cooperative on approach. Injection given in left upper outer quadrant without c/o. Pt to return in approximately 4 weeks for next due injection.

## 2020-07-30 ENCOUNTER — Telehealth: Payer: Medicare HMO

## 2020-07-30 DIAGNOSIS — R69 Illness, unspecified: Secondary | ICD-10-CM | POA: Diagnosis not present

## 2020-07-30 DIAGNOSIS — F411 Generalized anxiety disorder: Secondary | ICD-10-CM | POA: Diagnosis not present

## 2020-08-03 DIAGNOSIS — H353211 Exudative age-related macular degeneration, right eye, with active choroidal neovascularization: Secondary | ICD-10-CM | POA: Diagnosis not present

## 2020-08-03 DIAGNOSIS — H353222 Exudative age-related macular degeneration, left eye, with inactive choroidal neovascularization: Secondary | ICD-10-CM | POA: Diagnosis not present

## 2020-08-07 ENCOUNTER — Other Ambulatory Visit: Payer: Medicare HMO

## 2020-08-07 ENCOUNTER — Emergency Department (HOSPITAL_COMMUNITY): Payer: Medicare HMO

## 2020-08-07 ENCOUNTER — Encounter (HOSPITAL_COMMUNITY): Payer: Self-pay

## 2020-08-07 ENCOUNTER — Emergency Department (HOSPITAL_COMMUNITY)
Admission: EM | Admit: 2020-08-07 | Discharge: 2020-08-07 | Disposition: A | Discharge status: Home / Self Care | Payer: Medicare HMO | Attending: Emergency Medicine | Admitting: Emergency Medicine

## 2020-08-07 ENCOUNTER — Other Ambulatory Visit: Payer: Self-pay

## 2020-08-07 DIAGNOSIS — M791 Myalgia, unspecified site: Secondary | ICD-10-CM | POA: Insufficient documentation

## 2020-08-07 DIAGNOSIS — Z20822 Contact with and (suspected) exposure to covid-19: Secondary | ICD-10-CM | POA: Diagnosis not present

## 2020-08-07 DIAGNOSIS — J181 Lobar pneumonia, unspecified organism: Secondary | ICD-10-CM | POA: Diagnosis not present

## 2020-08-07 DIAGNOSIS — J189 Pneumonia, unspecified organism: Secondary | ICD-10-CM | POA: Diagnosis not present

## 2020-08-07 DIAGNOSIS — J45909 Unspecified asthma, uncomplicated: Secondary | ICD-10-CM | POA: Diagnosis not present

## 2020-08-07 DIAGNOSIS — R0600 Dyspnea, unspecified: Secondary | ICD-10-CM | POA: Insufficient documentation

## 2020-08-07 DIAGNOSIS — R059 Cough, unspecified: Secondary | ICD-10-CM | POA: Diagnosis not present

## 2020-08-07 DIAGNOSIS — I10 Essential (primary) hypertension: Secondary | ICD-10-CM | POA: Insufficient documentation

## 2020-08-07 DIAGNOSIS — Z79899 Other long term (current) drug therapy: Secondary | ICD-10-CM | POA: Insufficient documentation

## 2020-08-07 DIAGNOSIS — Z96653 Presence of artificial knee joint, bilateral: Secondary | ICD-10-CM | POA: Insufficient documentation

## 2020-08-07 DIAGNOSIS — Z7952 Long term (current) use of systemic steroids: Secondary | ICD-10-CM | POA: Insufficient documentation

## 2020-08-07 LAB — CBC WITH DIFFERENTIAL/PLATELET
Abs Immature Granulocytes: 0.03 10*3/uL (ref 0.00–0.07)
Basophils Absolute: 0.1 10*3/uL (ref 0.0–0.1)
Basophils Relative: 1 %
Eosinophils Absolute: 0.1 10*3/uL (ref 0.0–0.5)
Eosinophils Relative: 1 %
HCT: 41.7 % (ref 39.0–52.0)
Hemoglobin: 14.3 g/dL (ref 13.0–17.0)
Immature Granulocytes: 0 %
Lymphocytes Relative: 19 %
Lymphs Abs: 1.7 10*3/uL (ref 0.7–4.0)
MCH: 29 pg (ref 26.0–34.0)
MCHC: 34.3 g/dL (ref 30.0–36.0)
MCV: 84.6 fL (ref 80.0–100.0)
Monocytes Absolute: 0.8 10*3/uL (ref 0.1–1.0)
Monocytes Relative: 9 %
Neutro Abs: 6.3 10*3/uL (ref 1.7–7.7)
Neutrophils Relative %: 70 %
Platelets: 189 10*3/uL (ref 150–400)
RBC: 4.93 MIL/uL (ref 4.22–5.81)
RDW: 14.7 % (ref 11.5–15.5)
WBC: 9 10*3/uL (ref 4.0–10.5)
nRBC: 0 % (ref 0.0–0.2)

## 2020-08-07 LAB — COMPREHENSIVE METABOLIC PANEL
ALT: 20 U/L (ref 0–44)
AST: 29 U/L (ref 15–41)
Albumin: 3.9 g/dL (ref 3.5–5.0)
Alkaline Phosphatase: 63 U/L (ref 38–126)
Anion gap: 9 (ref 5–15)
BUN: 11 mg/dL (ref 8–23)
CO2: 25 mmol/L (ref 22–32)
Calcium: 9 mg/dL (ref 8.9–10.3)
Chloride: 102 mmol/L (ref 98–111)
Creatinine, Ser: 0.81 mg/dL (ref 0.61–1.24)
GFR, Estimated: >60 mL/min (ref >60)
Glucose, Bld: 108 mg/dL — ABNORMAL HIGH (ref 70–99)
Potassium: 4.4 mmol/L (ref 3.5–5.1)
Sodium: 136 mmol/L (ref 135–145)
Total Bilirubin: 1 mg/dL (ref 0.3–1.2)
Total Protein: 7.2 g/dL (ref 6.5–8.1)

## 2020-08-07 LAB — RESP PANEL BY RT-PCR (FLU A&B, COVID) ARPGX2
Influenza A by PCR: NEGATIVE
Influenza B by PCR: NEGATIVE
SARS Coronavirus 2 by RT PCR: NEGATIVE

## 2020-08-07 MED ORDER — DOXYCYCLINE HYCLATE 100 MG PO CAPS: 100.0000 mg | ORAL_CAPSULE | Freq: Two times a day (BID) | ORAL | 0 refills | Status: DC

## 2020-08-07 NOTE — Discharge Instructions (Signed)
Take the antibiotics as prescribed. Your Covid and flu test were negative. Return to the ER if you start to experience worsening shortness of breath, leg swelling, chest pain, increased cough productive with blood

## 2020-08-07 NOTE — ED Provider Notes (Signed)
[facility] DEPT Provider Note   CSN: [id number] Arrival date & time: 08/07/20  1023     History Chief Complaint  Patient presents with  . Cough  . Generalized Body Aches    Sean Hill is a 79 y.o. male with a past medical history of GERD, anxiety, hypertension, hyperlipidemia presenting to the ED with 2-day history of productive cough and generalized body aches.  Reports aching all over his body.  Denies any specific chest pain or abdominal pain.  No vomiting or diarrhea.  Denies any sick contacts with similar symptoms.  Reports intermittently coughing up mucus.  No hemoptysis, leg swelling.  He does have some dyspnea on exertion that he noticed for 2 days as well.  Did see his PCP this morning, had a send out Covid test done was told to come to the ER.  He denies any chronic lung disease or tobacco use.  He has been fully vaccinated against Covid and did receive his booster shot several weeks ago.  Denies any known Covid exposures.  Took NyQuil last night with only minimal improvement in his symptoms.  HPI     Past Medical History:  Diagnosis Date  . Anal fissure   . Anxiety   . Arthritis    right wrist; pt. states "everywhere"  . Asthma   . Bipolar disorder ([place])   . Cataract   . Depression   . GERD (gastroesophageal reflux disease)   . Hemorrhage of colon following colonoscopy 10/08/2019  . History of kidney stones   . 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 Hyperlipidemia   . Hypertension    states under control with med., has been on med. x 1 yr.  . Impaired hearing   . Left bundle branch block (LBBB) on electrocardiogram 10/29/2015  . Macular degeneration (senile) of retina    left  . Wears glasses     Patient Active Problem List   Diagnosis Date Noted  . Bipolar affective disorder, currently depressed, mild ([place]) 10/21/2019  . Hemorrhage of colon following colonoscopy 10/08/2019  . Adenomatous polyp  of ascending colon   . Tubulovillous adenoma of colon   . History of colonic polyps   . Diverticulosis of colon without hemorrhage   . Grade I internal hemorrhoids   . Essential hypertension 08/23/2016  . Left bundle branch block (LBBB) on electrocardiogram 06/10/2016  . Hyperlipidemia 06/07/2016  . Left leg swelling 01/06/2015  . Change in bowel habits 01/06/2015  . Gastroesophageal reflux disease 11/28/2011  . Bipolar disorder ([place]) 11/25/2011    Past Surgical History:  Procedure Laterality Date  . ANKLE FUSION Bilateral   . ANKLE SURGERY Bilateral    ligament surgery  . BIOPSY  04/29/2020   Procedure: BIOPSY;  Surgeon: Lavena Bullion, DO;  Location: [facility];  Service: Gastroenterology;;  . CARDIAC CATHETERIZATION  x 2   1983; 11/14/2003  . CARPOMETACARPEL SUSPENSION PLASTY Right 09/22/2016   Procedure: right thumb carpometacarpal  ARTHROPLASTY;  Surgeon: Leanora Cover, MD;  Location: [facility];  Service: Orthopedics;  Laterality: Right;  right thumb carpometacarpal  ARTHROPLASTY  . CARPOMETACARPEL SUSPENSION PLASTY Right 11/02/2017   Procedure: RIGHT THUMB SUSPENSIONPLASTY WITH TIGHTROPE, DISTAL POLE SCAPHOID  EXCISION;  Surgeon: Leanora Cover, MD;  Location: [facility];  Service: Orthopedics;  Laterality: Right;  . COLONOSCOPY WITH PROPOFOL N/A 10/02/2019   Procedure: COLONOSCOPY WITH PROPOFOL;  Surgeon: Lavena Bullion, DO;  Location:  [facility];  Service: Gastroenterology;  Laterality: N/A;  . COLONOSCOPY WITH PROPOFOL N/A 10/09/2019   Procedure: COLONOSCOPY WITH PROPOFOL;  Surgeon: Gatha Mayer, MD;  Location: [facility];  Service: Endoscopy;  Laterality: N/A;  . COLONOSCOPY WITH PROPOFOL N/A 04/29/2020   Procedure: COLONOSCOPY WITH PROPOFOL;  Surgeon: Lavena Bullion, DO;  Location: [facility];  Service: Gastroenterology;  Laterality: N/A;  . ELBOW SURGERY Left   . ENDOSCOPIC MUCOSAL RESECTION N/A 10/02/2019   Procedure: ENDOSCOPIC  MUCOSAL RESECTION;  Surgeon: Lavena Bullion, DO;  Location: [facility];  Service: Gastroenterology;  Laterality: N/A;  . HEMOSTASIS CLIP PLACEMENT  10/09/2019   Procedure: HEMOSTASIS CLIP PLACEMENT;  Surgeon: Gatha Mayer, MD;  Location: [facility];  Service: Endoscopy;;  . INGUINAL HERNIA REPAIR Right   . OPEN REDUCTION INTERNAL FIXATION (ORIF) DISTAL RADIAL FRACTURE Right 10/29/2015   Procedure: OPEN REDUCTION INTERNAL FIXATION (ORIF) DISTAL RADIAL FRACTURE;  Surgeon: Leanora Cover, MD;  Location: [facility];  Service: Orthopedics;  Laterality: Right;  . ORIF ELBOW FRACTURE Right   . POLYPECTOMY  04/29/2020   Procedure: POLYPECTOMY;  Surgeon: Lavena Bullion, DO;  Location: [facility];  Service: Gastroenterology;;  . Lia Foyer LIFTING INJECTION  10/02/2019   Procedure: SUBMUCOSAL LIFTING INJECTION;  Surgeon: Lavena Bullion, DO;  Location: [facility];  Service: Gastroenterology;;  . TONSILLECTOMY    . TOTAL KNEE ARTHROPLASTY Bilateral   . ULNAR COLLATERAL LIGAMENT REPAIR Right 12/29/2015   Procedure: REPAIR  RIGHT LATERAL ULNAR COLLATERAL LIGAMENT TEAR  EXTENSOR ORIGIN ;  Surgeon: Leanora Cover, MD;  Location: [facility];  Service: Orthopedics;  Laterality: Right;  . WRIST ARTHROSCOPY WITH DEBRIDEMENT Right 07/14/2016   Procedure: RIGHT WRIST ARTHROSCOPY WITH DEBRIDEMENT  TRIANGULAR FIBROCARTILAGE COMPLEX;  Surgeon: Leanora Cover, MD;  Location: [facility];  Service: Orthopedics;  Laterality: Right;       Family History  Problem Relation Age of Onset  . Alcohol abuse Mother   . Ovarian cancer Mother   . Alcohol abuse Father   . Pancreatic cancer Father   . Depression Daughter   . Paranoid behavior Daughter   . Hyperlipidemia Brother   . Barrett's esophagus Brother   . Colon cancer Maternal Grandmother   . Esophageal cancer Neg Hx   . Stomach cancer Neg Hx   . Rectal cancer Neg Hx     Social History   Tobacco Use  .  Smoking status: Never Smoker  . Smokeless tobacco: Never Used  Vaping Use  . Vaping Use: Never used  Substance Use Topics  . Alcohol use: Not Currently  . Drug use: No    Home Medications Prior to Admission medications   Medication Sig Start Date End Date Taking? Authorizing Provider  albuterol (VENTOLIN HFA) 108 (90 Base) MCG/ACT inhaler Inhale 2 puffs into the lungs every 6 (six) hours as needed for wheezing or shortness of breath. 06/02/20   Nafziger, Tommi Rumps, NP  ARIPiprazole ER (ABILIFY MAINTENA) 400 MG PRSY prefilled syringe Inject 400 mg into the muscle every 28 (twenty-eight) days. 06/25/20 06/25/21  Arfeen, Arlyce Harman, MD  benztropine (COGENTIN) 1 MG tablet Take 1 tablet (1 mg total) by mouth daily. 06/25/20   Arfeen, Arlyce Harman, MD  citalopram (CELEXA) 20 MG tablet Take 1 tablet (20 mg total) by mouth daily. 06/25/20 06/25/21  Arfeen, Arlyce Harman, MD  doxycycline (VIBRAMYCIN) 100 MG capsule Take 1 capsule (100 mg total) by mouth 2 (two) times daily. 08/07/20   Delia Heady,  PA-C  ezetimibe (ZETIA) 10 MG tablet Take 1 tablet (10 mg total) by mouth daily. 06/02/20   Nafziger, Tommi Rumps, NP  fluticasone (FLONASE) 50 MCG/ACT nasal spray Place 1 spray into both nostrils in the morning and at bedtime.    [provider]  losartan (COZAAR) 50 MG tablet Take 1 tablet (50 mg total) by mouth daily. 06/19/20   Nafziger, Tommi Rumps, NP  Multiple Vitamin (MULTIVITAMIN WITH MINERALS) TABS tablet Take 1 tablet by mouth daily.    [provider]  Multiple Vitamins-Minerals (PRESERVISION AREDS 2 PO) Take 1 capsule by mouth 2 (two) times daily.    [provider]  Omega-3 Fatty Acids (FISH OIL PO) Take 1 capsule by mouth daily.     [provider]  pantoprazole (PROTONIX) 40 MG tablet Take 1 tablet (40 mg total) by mouth 2 (two) times daily. 08/26/19   Cirigliano, Vito V, DO  temazepam (RESTORIL) 30 MG capsule Take 1 capsule (30 mg total) by mouth at bedtime. 06/25/20   Arfeen, Arlyce Harman, MD  traZODone  (DESYREL) 50 MG tablet Take 1 tablet (50 mg total) by mouth at bedtime. 06/25/20   Arfeen, Arlyce Harman, MD  Vitamin D, Cholecalciferol, 1000 UNITS TABS Take 1,000 Units by mouth daily.     [provider]    Allergies    Penicillins, Lisinopril, and Codeine  Review of Systems   Review of Systems  Constitutional: Negative for appetite change, chills and fever.  HENT: Negative for ear pain, rhinorrhea, sneezing and sore throat.   Eyes: Negative for photophobia and visual disturbance.  Respiratory: Positive for cough and shortness of breath. Negative for chest tightness and wheezing.   Cardiovascular: Negative for chest pain and palpitations.  Gastrointestinal: Negative for abdominal pain, blood in stool, constipation, diarrhea, nausea and vomiting.  Genitourinary: Negative for dysuria, hematuria and urgency.  Musculoskeletal: Positive for myalgias.  Skin: Negative for rash.  Neurological: Negative for dizziness, weakness and light-headedness.    Physical Exam Updated Vital Signs BP (!) 131/93   Pulse 70   Temp 98.8 F (37.1 C) (Oral)   Resp 18   Ht 5\' 9"  (1.753 m)   Wt 117 kg   SpO2 94%   BMI 38.10 kg/m   Physical Exam Vitals and nursing note reviewed.  Constitutional:      General: He is not in acute distress.    Appearance: He is well-developed.     Comments: Speaking in complete sentences without difficulty.  HENT:     Head: Normocephalic and atraumatic.     Nose: Nose normal.  Eyes:     General: No scleral icterus.       Right eye: No discharge.        Left eye: No discharge.     Conjunctiva/sclera: Conjunctivae normal.  Cardiovascular:     Rate and Rhythm: Normal rate and regular rhythm.     Heart sounds: Normal heart sounds. No murmur heard.  No friction rub. No gallop.   Pulmonary:     Effort: Pulmonary effort is normal. No respiratory distress.     Breath sounds: Rhonchi present.     Comments: Cough noted on exam. Abdominal:     General: Bowel sounds  are normal. There is no distension.     Palpations: Abdomen is soft.     Tenderness: There is no abdominal tenderness. There is no guarding.  Musculoskeletal:        General: No tenderness. Normal range of motion.  Cervical back: Normal range of motion and neck supple.     Right lower leg: No edema.     Left lower leg: No edema.     Comments: No lower extremity edema, erythema or calf tenderness bilaterally.  Skin:    General: Skin is warm and dry.     Findings: No rash.  Neurological:     Mental Status: He is alert.     Motor: No abnormal muscle tone.     Coordination: Coordination normal.     ED Results / Procedures / Treatments   Labs (all labs ordered are listed, but only abnormal results are displayed) Labs Reviewed  COMPREHENSIVE METABOLIC PANEL - Abnormal; Notable for the following components:      Result Value   Glucose, Bld 108 (*)    All other components within normal limits  RESP PANEL BY RT-PCR (FLU A&B, COVID) ARPGX2  CBC WITH DIFFERENTIAL/PLATELET    EKG None  Radiology DG Chest Portable 1 View  Result Date: 08/07/2020 CLINICAL DATA:  Cough with generalized body aches which is worsening EXAM: PORTABLE CHEST 1 VIEW COMPARISON:  11/07/2018 FINDINGS: Asymmetric hazy density at the right base. Borderline hyperinflation. Normal heart size and mediastinal contours. IMPRESSION: Right lower lobe pneumonia. Followup PA and lateral chest X-ray is recommended in 3-4 weeks following trial of antibiotic therapy to ensure resolution. Electronically Signed   By: Monte Fantasia M.D.   On: 08/07/2020 11:57    Procedures Procedures (including critical care time)  Medications Ordered in ED Medications - No data to display  ED Course  I have reviewed the triage vital signs and the nursing notes.  Pertinent labs & imaging results that were available during my care of the patient were reviewed by me and considered in my medical decision making (see chart for  details).  Clinical Course as of Aug 07 1413  Fri Aug 07, 2020  1343 WBC: 9.0 [HK]    Clinical Course User Index [HK] Delia Heady, [place]   MDM Rules/Calculators/A&P                          79 year old male with past medical history of GERD, anxiety, hypertension and hyperlipidemia presenting to the ED for 2-day history of productive cough and generalized body aches.  Reports cough productive with mucus.  Sent to the ED by PCP.  Denies any tobacco use or chronic lung disease.  He has been fully vaccinated against Covid including booster recently.  On exam no lower extremity edema, erythema or calf tenderness bilaterally.  He is speaking in complete sentences although a significant cough is noted.  Coarse breath sounds noted bilaterally.  He is afebrile here without recent use of antipyretics.  He is not tachycardic or tachypneic, oxygen saturation is ranging between 92% and 97% on room air.  Chest x-ray shows possible right lower lobe pneumonia.  Lab work is unremarkable, Covid and flu test are negative.  Discussed patient regarding concern for other etiology based on lack of white count and fever against pneumonia.  I did order a CT of his chest but he is declining.  States that he does not feel he needs to be admitted either and would like to manage his symptoms at home with antibiotics to see if this improves.  Discussed possibility of fluid or PE but patient states that "I just feel like I do have pneumonia and will think there is anything else going on."  He understands  that he needs to return to the ER for any worsening symptoms  All imaging, if done today, including plain films, CT scans, and ultrasounds, independently reviewed by me, and interpretations confirmed via formal radiology reads.  Patient is hemodynamically stable, in NAD, and able to ambulate in the ED. Evaluation does not show pathology that would require ongoing emergent intervention or inpatient treatment. I explained the  diagnosis to the patient. Pain has been managed and has no complaints prior to discharge. Patient is comfortable with above plan and is stable for discharge at this time. All questions were answered prior to disposition. Strict return precautions for returning to the ED were discussed. Encouraged follow up with PCP.   An After Visit Summary was printed and given to the patient.   Portions of this note were generated with [employer]. Dictation errors may occur despite best attempts at proofreading.  Final Clinical Impression(s) / ED Diagnoses Final diagnoses:  Community acquired pneumonia of right lower lobe of lung    Rx / DC Orders ED Discharge Orders         Ordered    doxycycline (VIBRAMYCIN) 100 MG capsule  2 times daily        08/07/20 1411           Delia Heady, PA-C 08/07/20 1414    Charlesetta Shanks, MD 08/09/20 [phone]

## 2020-08-07 NOTE — ED Triage Notes (Signed)
Pt presents with c/o cough and generalized body aches. Pt reports he was instructed to come here by his MD, has had a covid test this morning, no results returned as of yet.

## 2020-08-07 NOTE — ED Notes (Signed)
Patient removed their IV, stated  He took it out because he had to go to the bathroom.

## 2020-08-08 LAB — SARS-COV-2, NAA 2 DAY TAT

## 2020-08-08 LAB — NOVEL CORONAVIRUS, NAA: SARS-CoV-2, NAA: NOT DETECTED

## 2020-08-12 ENCOUNTER — Telehealth: Payer: Self-pay | Admitting: Pharmacist

## 2020-08-12 NOTE — Chronic Care Management (AMB) (Signed)
Chronic Care Management Pharmacy Assistant   Name: Sean Hill  MRN: [id number] DOB: 1941/03/21  Reason for Encounter: Medication Review  Patient Questions:  1.  Have you seen any other providers since your last visit? Yes, ED visit on 08-07-2020  2.  Any changes in your medicines or health? Yes, Doxycycline (VIBRAMYCIN) 100 MG capsule 10-day Supply   PCP : [name], NP  Allergies:   Allergies  Allergen Reactions  . Penicillins Other (See Comments)    BLISTERS Did it involve swelling of the face/tongue/throat, SOB, or low BP? No Did it involve sudden or severe rash/hives, skin peeling, or any reaction on the inside of your mouth or nose? No Did you need to seek medical attention at a hospital or doctor's office? No When did it last happen?around 1980 If all above answers are "NO", may proceed with cephalosporin use.   Marland Kitchen Lisinopril     Dry cough, abdominal bloating and diarrhea  . Codeine Rash    Medications: Outpatient Encounter Medications as of 08/12/2020  Medication Sig  . albuterol (VENTOLIN HFA) 108 (90 Base) MCG/ACT inhaler Inhale 2 puffs into the lungs every 6 (six) hours as needed for wheezing or shortness of breath.  . ARIPiprazole ER (ABILIFY MAINTENA) 400 MG PRSY prefilled syringe Inject 400 mg into the muscle every 28 (twenty-eight) days.  . benztropine (COGENTIN) 1 MG tablet Take 1 tablet (1 mg total) by mouth daily.  . citalopram (CELEXA) 20 MG tablet Take 1 tablet (20 mg total) by mouth daily.  Marland Kitchen doxycycline (VIBRAMYCIN) 100 MG capsule Take 1 capsule (100 mg total) by mouth 2 (two) times daily.  Marland Kitchen ezetimibe (ZETIA) 10 MG tablet Take 1 tablet (10 mg total) by mouth daily.  . fluticasone (FLONASE) 50 MCG/ACT nasal spray Place 1 spray into both nostrils in the morning and at bedtime.  Marland Kitchen losartan (COZAAR) 50 MG tablet Take 1 tablet (50 mg total) by mouth daily.  . Multiple Vitamin (MULTIVITAMIN WITH MINERALS) TABS tablet Take 1 tablet by mouth  daily.  . Multiple Vitamins-Minerals (PRESERVISION AREDS 2 PO) Take 1 capsule by mouth 2 (two) times daily.  . Omega-3 Fatty Acids (FISH OIL PO) Take 1 capsule by mouth daily.   . pantoprazole (PROTONIX) 40 MG tablet Take 1 tablet (40 mg total) by mouth 2 (two) times daily.  . temazepam (RESTORIL) 30 MG capsule Take 1 capsule (30 mg total) by mouth at bedtime.  . traZODone (DESYREL) 50 MG tablet Take 1 tablet (50 mg total) by mouth at bedtime.  . Vitamin D, Cholecalciferol, 1000 UNITS TABS Take 1,000 Units by mouth daily.    Facility-Administered Encounter Medications as of 08/12/2020  Medication  . ARIPiprazole ER (ABILIFY MAINTENA) 400 MG prefilled syringe 400 mg    Current Diagnosis: Patient Active Problem List   Diagnosis Date Noted  . Bipolar affective disorder, currently depressed, mild ([place]) 10/21/2019  . Hemorrhage of colon following colonoscopy 10/08/2019  . Adenomatous polyp of ascending colon   . Tubulovillous adenoma of colon   . History of colonic polyps   . Diverticulosis of colon without hemorrhage   . Grade I internal hemorrhoids   . Essential hypertension 08/23/2016  . Left bundle branch block (LBBB) on electrocardiogram 06/10/2016  . Hyperlipidemia 06/07/2016  . Left leg swelling 01/06/2015  . Change in bowel habits 01/06/2015  . Gastroesophageal reflux disease 11/28/2011  . Bipolar disorder ([place]) 11/25/2011    Goals Addressed   None    Reviewed chart for  medication changes ahead of medication coordination call. OVs, Consults, or hospital visits since last care coordination call/Pharmacist visit.  Medication changes indicated.  BP Readings from Last 3 Encounters:  08/07/20 136/72  04/29/20 (!) 144/56  02/21/20 (!) 146/74    Lab Results  Component Value Date   HGBA1C 6.0 12/25/2019     Patient obtains medications through Adherence Packaging  90 Days   Last adherence delivery included:  Marland Kitchen Citalopram (CELEXA) 20 mg: one tablet at  breakfast . Trazodone (DESYREL) 50 mg: one capsule at bedtime . Benztropine (COGENTIN) 1 mg: one tablet at lunch . Ezetimibe (ZETIA) 10 mg: one tablet at lunch . Temazepam (RESTORIL) 30 mg: one capsule at bedtime . Chlorpheniramine 4 mg . Omega-3 Fatty Acids 1,000 Mg Capsule . Preservision Areds-2g . Men's 50 plus multivitamin: one tablet at breakfast . Vitamin D3 25 Mcg: one tablet at breakfast  Patient declined the following medications do to PRN . Albuterol Aer Hfa (PRN) . Tobramycin 0.3% eyedrops (PRN) .  Patient is due for next adherence delivery on: 08-21-2020. Called patient and reviewed medications and coordinated delivery.  This delivery to include: . Citalopram (CELEXA) 20 mg: one tablet at breakfast . Trazodone (DESYREL) 50 mg: one capsule at bedtime . Benztropine (COGENTIN) 1 mg: one tablet at lunch . Ezetimibe (ZETIA) 10 mg: one tablet at lunch . Temazepam (RESTORIL) 30 mg: one capsule at bedtime . Chlorpheniramine 4 mg . Omega-3 Fatty Acids 1,000 Mg Capsule . Preservision Areds-2g . Men's 50 plus multivitamin: one tablet at breakfast . Vitamin D3 25 Mcg: one tablet at breakfast . Pantoprazole (PROTONIX) 40 mg: one tablet at breakfast and at dinner (Added)  Patient needs refills for Pantoprazole (PROTONIX) 40 mg.  Confirmed delivery date of 08-21-2020, advised patient that pharmacy will contact them the morning of delivery.  Follow-Up:  Coordination of Enhanced Pharmacy Services and Pharmacist Review    Maia Breslow, [facility] Assistant [phone]

## 2020-08-17 ENCOUNTER — Other Ambulatory Visit: Payer: Self-pay

## 2020-08-17 ENCOUNTER — Ambulatory Visit (INDEPENDENT_AMBULATORY_CARE_PROVIDER_SITE_OTHER): Payer: Medicare HMO | Admitting: *Deleted

## 2020-08-17 ENCOUNTER — Encounter (HOSPITAL_COMMUNITY): Payer: Self-pay | Admitting: *Deleted

## 2020-08-17 VITALS — BP 139/78 | HR 75 | Wt 260.4 lb

## 2020-08-17 DIAGNOSIS — R69 Illness, unspecified: Secondary | ICD-10-CM | POA: Diagnosis not present

## 2020-08-17 DIAGNOSIS — F319 Bipolar disorder, unspecified: Secondary | ICD-10-CM

## 2020-08-17 NOTE — Progress Notes (Signed)
Pt in clinic today for due Abilify Maintena 400 mg injection. Pt appropriate, pleasant and cooperative on approach. Pt no complaints but did say that he was diagnosed with  pneumonia over the weekend.Injection was given in right upper outer quadrant without c/o. Pt to return in approximately 4 weeks for next due injection.

## 2020-08-21 ENCOUNTER — Telehealth: Payer: Medicare HMO

## 2020-08-21 NOTE — Chronic Care Management (AMB) (Deleted)
Chronic Care Management Pharmacy  Name: Sean Hill  MRN: [id number] DOB: 1940/11/17  Initial Questions: 1. Have you seen any other providers since your last visit? N/A 2. Any changes in your medicines or health? No   Chief Complaint/ HPI  Sean Hill,  79 y.o. , male presents for their Follow-Up CCM visit with the clinical pharmacist via telephone due to COVID-19 Pandemic   PCP : [name], NP  Their chronic conditions include: GERD, HTN, bipolar, HLD, insomnia, glucose intolerance  Office Visits: 06/19/20 [name], NP: Patient presented for video visit for HTN follow up. Switched lisinopril to losratan 50 mg due to dry cough.   06/04/2021[name], NP- Patient presented for office visit for right-sided maxillary sinus pain, pressure, nasal congestion, headache, and tooth pain. Patient to start doxycycline BID for 7 days and Patient to use over the counter fluticasone nasal spray.   12/25/2019- [name], NP- Patient presented for office visit for annual exam. Patient to obtain routine lab work (CBC, CMET, lipid panel, TSH, A1c). Consider adding Zetia and/or increasing Lipitor.   Consult Visit: 08/07/20 Patient presented to the ED for pneumonia.  07/30/20 Gracelyn Nurse, LCSW (psychiatry): Patient presented for bipolar 1 follow up. Unable to access notes.   06/11/20 Everitt Amber (ophthalmology): Patient presented for eye exam. Unable to access notes.  05/14/20 Gracelyn Nurse, LCSW (psychiatry): Patient presented for bipolar 1 follow up. Unable to access notes.  04/29/20 Patient admitted for colonoscopy with evaluation of internal hemorrhoids.  03/26/2020- Psychiatry- Gracelyn Nurse, LCSW- unable to access notes.  03/24/2020- Behavioral health- Berniece Andreas, MD- unable to access notes   Medications: Outpatient Encounter Medications as of 08/21/2020  Medication Sig  . albuterol (VENTOLIN HFA) 108 (90 Base) MCG/ACT inhaler Inhale 2 puffs  into the lungs every 6 (six) hours as needed for wheezing or shortness of breath.  . ARIPiprazole ER (ABILIFY MAINTENA) 400 MG PRSY prefilled syringe Inject 400 mg into the muscle every 28 (twenty-eight) days.  . benztropine (COGENTIN) 1 MG tablet Take 1 tablet (1 mg total) by mouth daily.  . citalopram (CELEXA) 20 MG tablet Take 1 tablet (20 mg total) by mouth daily.  Marland Kitchen doxycycline (VIBRAMYCIN) 100 MG capsule Take 1 capsule (100 mg total) by mouth 2 (two) times daily.  Marland Kitchen ezetimibe (ZETIA) 10 MG tablet Take 1 tablet (10 mg total) by mouth daily.  . fluticasone (FLONASE) 50 MCG/ACT nasal spray Place 1 spray into both nostrils in the morning and at bedtime.  Marland Kitchen losartan (COZAAR) 50 MG tablet Take 1 tablet (50 mg total) by mouth daily.  . Multiple Vitamin (MULTIVITAMIN WITH MINERALS) TABS tablet Take 1 tablet by mouth daily.  . Multiple Vitamins-Minerals (PRESERVISION AREDS 2 PO) Take 1 capsule by mouth 2 (two) times daily.  . Omega-3 Fatty Acids (FISH OIL PO) Take 1 capsule by mouth daily.   . pantoprazole (PROTONIX) 40 MG tablet Take 1 tablet (40 mg total) by mouth 2 (two) times daily.  . temazepam (RESTORIL) 30 MG capsule Take 1 capsule (30 mg total) by mouth at bedtime.  . traZODone (DESYREL) 50 MG tablet Take 1 tablet (50 mg total) by mouth at bedtime.  . Vitamin D, Cholecalciferol, 1000 UNITS TABS Take 1,000 Units by mouth daily.    Facility-Administered Encounter Medications as of 08/21/2020  Medication  . ARIPiprazole ER (ABILIFY MAINTENA) 400 MG prefilled syringe 400 mg     Current Diagnosis/Assessment:  Goals Addressed   None       Hypertension  Reports dizziness/ lightheadedness due to vision changes.   Office blood pressures are  BP Readings from Last 3 Encounters:  08/17/20 139/78  08/07/20 136/72  07/20/20 132/73    Patient has failed these meds in the past: amlodipine, lisinopril (cough)  Patient checks BP at home 1-2x per week  Patient home BP readings are  ranging:   Patient is controlled on:   Losartan 95m, 1 tablet once daily   We discussed:   Plan Continue current medications   Hyperlipidemia    LDL goal < 100  Lipid Panel     Component Value Date/Time   CHOL 140 12/25/2019 0823   TRIG 265.0 (H) 12/25/2019 0823   HDL 32.00 (L) 12/25/2019 0823   LDLDIRECT 72.0 12/25/2019 0823    Hepatic Function Latest Ref Rng & Units 08/07/2020 12/25/2019 10/09/2019  Total Protein 6.5 - 8.1 g/dL 7.2 6.7 6.4(L)  Albumin 3.5 - 5.0 g/dL 3.9 4.5 3.9  AST 15 - 41 U/L 29 25 29   ALT 0 - 44 U/L 20 17 21   Alk Phosphatase 38 - 126 U/L 63 59 59  Total Bilirubin 0.3 - 1.2 mg/dL 1.0 0.5 0.6  Bilirubin, Direct 0.0 - 0.3 mg/dL - - -     The 10-year ASCVD risk score (Mikey BussingDC Jr., et al., 2013) is: 36.9%   Values used to calculate the score:     Age: 89years     Sex: Male     Is Non-Hispanic African American: No     Diabetic: No     Tobacco smoker: No     Systolic Blood Pressure: 1428mmHg     Is BP treated: Yes     HDL Cholesterol: 32 mg/dL     Total Cholesterol: 140 mg/dL    Patient has failed these meds in past: atorvastatin (reports stop taking about 3 months ago due concern of muscle pain)   Patient is currently uncontrolled on the following medications:  . Fish oil , 1 capsule once daily (in the morning) . Ezetimibe 139m 1 tablet once daily   We discussed:   Plan Continue current medications    Bipolar disorder   Patient is currently controlled on the following medications:  . Aripiprazole ER 40075minject 400m70mery twenty-eight days  . Citalopram 20mg71mtablet once daily . Trazodone 50mg,47mablet at bedtime   Tardive dyskinesia prevention:  (per patient report)   Benztropine 1mg, 147mblet once daily   Plan Managed by psychiatrist  Continue current medications    Insomnia   Reports mind races at night and reports temazepam helps him calm down. Patient reports being on this for about 7 to 8 years  Patient is  currently controlled on the following medications:  . Temazepam 30mg, 119msule at bedtime  . Trazodone 50 mg 1 tablet at bedtime  Plan Continue current medications  GERD   Patient reports triggers:caffeine (has taken out of diet) and has helped some. Patient reports he stays away from tomato sauce.   Patient has failed these meds in past:  Alum hydroxide- mag carbonate, 1 tablet three times daily as needed for heartburn  Patient is currently controlled on the following medications:  . Pantoprazole 40mg, 1 7met twice daily (reports usually taking once daily)    We discussed:  non-pharmacological interventions for acid reflux. Take measures to prevent acid reflux, such as avoiding spicy foods, avoiding caffeine, avoid laying down a few hours after eating, and raising the head of the  bed.  Plan Continue current medications  Glucose intolerance    Recent Relevant Labs: Lab Results  Component Value Date/Time   HGBA1C 6.0 12/25/2019 08:23 AM   HGBA1C 6.1 11/07/2018 07:41 AM   MICROALBUR 1.5 11/28/2011 04:28 PM     Patient is currently controlled on the following medications:   No medications  Last diabetic Eye exam: No results found for: HMDIABEYEEXA  Last diabetic Foot exam: No results found for: HMDIABFOOTEX   We discussed:   modifying lifestyle, including to participate in moderate physical activity (e.g., walking) at least 150 minutes per week.   Mediterranean eating plan with an emphasis on whole grains, legumes, nuts, fruits, and vegetables and minimal refined and processed foods.   Plan Focus on managing/monitoring CVD risk factors, including keeping blood pressure and lipids under goal Continue as is.   Sinus congestion   Patient has failed these meds in past: oxymetazolin (Afrin)   Patient is currently controlled on the following medications:  . Fluticasone nasal spray, 1 spray into both nostrils in the morning and at bedtime    Plan Continue current  medications  OTC/ supplements    Patient is currently on the following medications:  Marland Kitchen Multivitamin, 1 tablet once daily . Preservision AREDS 2, 1 capsule twice daily . Vitamin D3, 1,000 units once daily   Plan Continue current medications  Vaccines   Reviewed and discussed patient's vaccination history.    Immunization History  Administered Date(s) Administered  . Fluad Quad(high Dose 65+) 05/29/2019  . Influenza Inj Mdck Quad Pf 05/29/2019  . Influenza, High Dose Seasonal PF 06/07/2016, 05/15/2017  . Influenza-Unspecified 07/20/2013  . PFIZER SARS-COV-2 Vaccination 10/25/2019, 11/19/2019  . Pneumococcal Conjugate-13 03/11/2015  . Pneumococcal Polysaccharide-23 07/06/2010  . Tdap 07/06/2010, 10/29/2015   Shingles? Flu shot?  Plan Recommend patient receive influenza and shingles vaccine in office/at pharmacy.     Medication Management   Patient's preferred pharmacy is:  [facility] - [place], [place] - [address] [place] [address] Phone: [phone] Fax: [phone]  [facility], [address] [place] [address] Phone: [phone] Fax: [phone]  Uses pill box? No - using adherence packaging Pt endorses ***% compliance  We discussed: {Pharmacy options:24294}  Plan  {US Pharmacy ZMCE:02233}   Follow-up Follow up visit with PharmD in 4 months.    Jeni Salles, PharmD [facility] Clinical Pharmacist [place] [facility] [place]

## 2020-09-02 ENCOUNTER — Other Ambulatory Visit: Payer: Self-pay | Admitting: Adult Health

## 2020-09-02 ENCOUNTER — Encounter: Payer: Self-pay | Admitting: Adult Health

## 2020-09-02 DIAGNOSIS — J189 Pneumonia, unspecified organism: Secondary | ICD-10-CM

## 2020-09-03 ENCOUNTER — Other Ambulatory Visit: Payer: Medicare HMO

## 2020-09-03 ENCOUNTER — Other Ambulatory Visit: Payer: Self-pay

## 2020-09-03 ENCOUNTER — Ambulatory Visit (INDEPENDENT_AMBULATORY_CARE_PROVIDER_SITE_OTHER): Payer: Medicare HMO

## 2020-09-03 DIAGNOSIS — J189 Pneumonia, unspecified organism: Secondary | ICD-10-CM

## 2020-09-03 DIAGNOSIS — R0602 Shortness of breath: Secondary | ICD-10-CM | POA: Diagnosis not present

## 2020-09-08 ENCOUNTER — Telehealth: Payer: Self-pay | Admitting: Pharmacist

## 2020-09-08 NOTE — Chronic Care Management (AMB) (Signed)
Chronic Care Management Pharmacy Assistant   Name: Sean Hill  MRN: [id number] DOB: 1940/11/24  Reason for Encounter: Medication Review  PCP : [name], NP  Allergies:   Allergies  Allergen Reactions  . Penicillins Other (See Comments)    BLISTERS Did it involve swelling of the face/tongue/throat, SOB, or low BP? No Did it involve sudden or severe rash/hives, skin peeling, or any reaction on the inside of your mouth or nose? No Did you need to seek medical attention at a hospital or doctor's office? No When did it last happen?around 1980 If all above answers are "NO", may proceed with cephalosporin use.   Marland Kitchen Lisinopril     Dry cough, abdominal bloating and diarrhea  . Codeine Rash    Medications: Outpatient Encounter Medications as of 09/08/2020  Medication Sig  . albuterol (VENTOLIN HFA) 108 (90 Base) MCG/ACT inhaler Inhale 2 puffs into the lungs every 6 (six) hours as needed for wheezing or shortness of breath.  . ARIPiprazole ER (ABILIFY MAINTENA) 400 MG PRSY prefilled syringe Inject 400 mg into the muscle every 28 (twenty-eight) days.  . benztropine (COGENTIN) 1 MG tablet Take 1 tablet (1 mg total) by mouth daily.  . citalopram (CELEXA) 20 MG tablet Take 1 tablet (20 mg total) by mouth daily.  Marland Kitchen doxycycline (VIBRAMYCIN) 100 MG capsule Take 1 capsule (100 mg total) by mouth 2 (two) times daily.  Marland Kitchen ezetimibe (ZETIA) 10 MG tablet Take 1 tablet (10 mg total) by mouth daily.  . fluticasone (FLONASE) 50 MCG/ACT nasal spray Place 1 spray into both nostrils in the morning and at bedtime.  Marland Kitchen losartan (COZAAR) 50 MG tablet Take 1 tablet (50 mg total) by mouth daily.  . Multiple Vitamin (MULTIVITAMIN WITH MINERALS) TABS tablet Take 1 tablet by mouth daily.  . Multiple Vitamins-Minerals (PRESERVISION AREDS 2 PO) Take 1 capsule by mouth 2 (two) times daily.  . Omega-3 Fatty Acids (FISH OIL PO) Take 1 capsule by mouth daily.   . pantoprazole (PROTONIX) 40 MG tablet  Take 1 tablet (40 mg total) by mouth 2 (two) times daily.  . temazepam (RESTORIL) 30 MG capsule Take 1 capsule (30 mg total) by mouth at bedtime.  . traZODone (DESYREL) 50 MG tablet Take 1 tablet (50 mg total) by mouth at bedtime.  . Vitamin D, Cholecalciferol, 1000 UNITS TABS Take 1,000 Units by mouth daily.    Facility-Administered Encounter Medications as of 09/08/2020  Medication  . ARIPiprazole ER (ABILIFY MAINTENA) 400 MG prefilled syringe 400 mg    Current Diagnosis: Patient Active Problem List   Diagnosis Date Noted  . Bipolar affective disorder, currently depressed, mild ([place]) 10/21/2019  . Hemorrhage of colon following colonoscopy 10/08/2019  . Adenomatous polyp of ascending colon   . Tubulovillous adenoma of colon   . History of colonic polyps   . Diverticulosis of colon without hemorrhage   . Grade I internal hemorrhoids   . Essential hypertension 08/23/2016  . Left bundle branch block (LBBB) on electrocardiogram 06/10/2016  . Hyperlipidemia 06/07/2016  . Left leg swelling 01/06/2015  . Change in bowel habits 01/06/2015  . Gastroesophageal reflux disease 11/28/2011  . Bipolar disorder ([place]) 11/25/2011    Goals Addressed   None    Reviewed chart for medication changes ahead of medication coordination call. No OVs, Consults, or hospital visits since last care coordination call/Pharmacist visit.  No medication changes indicated.  BP Readings from Last 3 Encounters:  08/07/20 136/72  04/29/20 (!) 144/56  02/21/20 Marland Kitchen)  146/74    Lab Results  Component Value Date   HGBA1C 6.0 12/25/2019     Patient obtains medications through Adherence Packaging  90 Days   Last adherence delivery included:   Citalopram (CELEXA) 20 mg: one tablet at breakfast  Trazodone (DESYREL) 50 mg: one capsule at bedtime  Benztropine (COGENTIN) 1 mg: one tablet at lunch  Ezetimibe (ZETIA) 10 mg: one tablet at lunch  Temazepam (RESTORIL) 30 mg: one capsule at  bedtime  Chlorpheniramine 4 mg  Omega-3 Fatty Acids 1,000 Mg Capsule  Preservision Areds-2g  Men's 50 plus multivitamin: one tablet at breakfast  Vitamin D3 25 Mcg: one tablet at breakfast  Pantoprazole (PROTONIX) 40 mg: one tablet at breakfast and at dinner (Added) I spoke with the patient and review medications. There are no changes in medications currently.Patient declined the following medications due supply on hand. The patient is taking the following medications:  Citalopram (CELEXA) 20 mg: one tablet at breakfast  Trazodone (DESYREL) 50 mg: one capsule at bedtime  Benztropine (COGENTIN) 1 mg: one tablet at lunch  Ezetimibe (ZETIA) 10 mg: one tablet at lunch  Temazepam (RESTORIL) 30 mg: one capsule at bedtime  Chlorpheniramine 4 mg  Omega-3 Fatty Acids 1,000 Mg Capsule  Preservision Areds-2g  Men's 50 plus multivitamin: one tablet at breakfast  Vitamin D3 25 Mcg: one tablet at breakfast  Pantoprazole (PROTONIX) 40 mg: one tablet at breakfast and at dinner (Added)  He currently doe not need refills.   Confirmed delivery date of 11-16-2020, advised patient that pharmacy will contact them the morning of delivery.  Follow-Up:  Coordination of Enhanced Pharmacy Services and Pharmacist Review   Maia Breslow, [place] Assistant [phone]

## 2020-09-15 ENCOUNTER — Encounter (HOSPITAL_COMMUNITY): Payer: Self-pay | Admitting: *Deleted

## 2020-09-15 ENCOUNTER — Ambulatory Visit (INDEPENDENT_AMBULATORY_CARE_PROVIDER_SITE_OTHER): Payer: Medicare HMO | Admitting: *Deleted

## 2020-09-15 VITALS — BP 184/73 | HR 97 | Resp 22 | Ht 66.0 in | Wt 264.0 lb

## 2020-09-15 DIAGNOSIS — F319 Bipolar disorder, unspecified: Secondary | ICD-10-CM | POA: Diagnosis not present

## 2020-09-15 DIAGNOSIS — R69 Illness, unspecified: Secondary | ICD-10-CM | POA: Diagnosis not present

## 2020-09-15 NOTE — Progress Notes (Signed)
Pt presents today for due Abilify Maintena 400 mg injection. Pt appropriate and cooperative on approach. Injection given in left upper outer quadrant without c/o. Pt to return in approximately 4 weeks for next due injection.

## 2020-09-16 ENCOUNTER — Telehealth: Payer: Self-pay | Admitting: Adult Health

## 2020-09-16 NOTE — Telephone Encounter (Signed)
Dyanne Iha Nurse at [facility] was calling to get clarification on the patients medication because he hasn't refilled his lisinopril (PRINIVIL,ZESTRIL) 20 MG tablet since October 2021 and she was wondering if he is still taking this medication.  Alfonse Flavors [phone]

## 2020-09-17 NOTE — Telephone Encounter (Signed)
Spoke with [employer] and informed her we are unable to give information without consent from the pt.  She stated she was calling to let the provider know they see inconsistencies with the medication not being refilled.  Message sent to PCP as notes from the 10/1 virtual visit state the Rx was discontinued and new Rx given.

## 2020-09-23 ENCOUNTER — Telehealth (INDEPENDENT_AMBULATORY_CARE_PROVIDER_SITE_OTHER): Payer: Medicare HMO | Admitting: Psychiatry

## 2020-09-23 ENCOUNTER — Encounter (HOSPITAL_COMMUNITY): Payer: Self-pay | Admitting: Psychiatry

## 2020-09-23 ENCOUNTER — Other Ambulatory Visit: Payer: Self-pay

## 2020-09-23 DIAGNOSIS — F319 Bipolar disorder, unspecified: Secondary | ICD-10-CM

## 2020-09-23 DIAGNOSIS — R69 Illness, unspecified: Secondary | ICD-10-CM | POA: Diagnosis not present

## 2020-09-23 DIAGNOSIS — F411 Generalized anxiety disorder: Secondary | ICD-10-CM

## 2020-09-23 MED ORDER — ARIPIPRAZOLE ER 400 MG IM PRSY: 400.0000 mg | PREFILLED_SYRINGE | INTRAMUSCULAR | 2 refills | Status: DC

## 2020-09-23 MED ORDER — BENZTROPINE MESYLATE 1 MG PO TABS: 1.0000 mg | ORAL_TABLET | Freq: Every day | ORAL | 0 refills | Status: DC

## 2020-09-23 MED ORDER — CITALOPRAM HYDROBROMIDE 20 MG PO TABS: 20.0000 mg | ORAL_TABLET | Freq: Every day | ORAL | 0 refills | Status: DC

## 2020-09-23 MED ORDER — TEMAZEPAM 30 MG PO CAPS: 30.0000 mg | ORAL_CAPSULE | Freq: Every day | ORAL | 0 refills | Status: DC

## 2020-09-23 MED ORDER — TRAZODONE HCL 50 MG PO TABS: 50.0000 mg | ORAL_TABLET | Freq: Every day | ORAL | 0 refills | Status: DC

## 2020-09-23 NOTE — Progress Notes (Signed)
Virtual Visit via Telephone Note  I connected with Sean Hill on 09/23/20 at  8:40 AM EST by telephone and verified that I am speaking with the correct person using two identifiers.  Location: Patient: Home Provider: Home Office   I discussed the limitations, risks, security and privacy concerns of performing an evaluation and management service by telephone and the availability of in person appointments. I also discussed with the patient that there may be a patient responsible charge related to this service. The patient expressed understanding and agreed to proceed.   History of Present Illness: Patient is evaluated by phone session.  He is on the phone by himself.  He is taking his medication as prescribed.  In November he was in the emergency room because of pneumonia but he is feeling much better now.  He reported his mood is stable and he denies any anger, mania, psychosis or any hallucination.  He had a good time with her family over Christmas.  He is getting his injection of Abilify which is keeping his mood stable.  He has no concern.  He has chronic tremors and sometimes dry mouth but it is stable and it is not worsening.  He is keeping appointment with [name] once a month for therapy and that helps his coping and social skills.  Patient like to keep his current medication.   Past Psychiatric History: H/Omultiple hospitalization. Last hospitalization in July 2014.H/Ooverdose onLatudawith alcohol.H/Ocutting wrist. Took Tegretol, Depakote, Ambien, Remeron, Vistaril, Geodon, Abilify, lithium, Provigil, Zoloft, Neurontin, Lamictal, Wellbutrin, Risperdal and Pristiq. H/Omania,aggression, getting speeding tickets, excessive buying and impulsive behavior.   Recent Results (from the past 2160 hour(s))  Novel Coronavirus, NAA ([facility])     Status: None   Collection Time: 08/07/20  9:54 AM   Specimen: Nasopharyngeal(NP) swabs in vial transport medium   Nasopharynge   Screenin  Result Value Ref Range   SARS-CoV-2, NAA Not Detected Not Detected    Comment: This nucleic acid amplification test was developed and its performance characteristics determined by [employer]. Nucleic acid amplification tests include RT-PCR and TMA. This test has not been FDA cleared or approved. This test has been authorized by FDA under an Emergency Use Authorization (EUA). This test is only authorized for the duration of time the declaration that circumstances exist justifying the authorization of the emergency use of in vitro diagnostic tests for detection of SARS-CoV-2 virus and/or diagnosis of COVID-19 infection under section 564(b)(1) of the Act, 21 U.S.C. 678LFY-1(O) (1), unless the authorization is terminated or revoked sooner. When diagnostic testing is negative, the possibility of a false negative result should be considered in the context of a patient's recent exposures and the presence of clinical signs and symptoms consistent with COVID-19. An individual without symptoms of COVID-19 and who is not shedding SARS-CoV-2 virus wo uld expect to have a negative (not detected) result in this assay.   SARS-COV-2, NAA 2 DAY TAT     Status: None   Collection Time: 08/07/20  9:54 AM   Nasopharynge  Screenin  Result Value Ref Range   SARS-CoV-2, NAA 2 DAY TAT Performed   Comprehensive metabolic panel     Status: Abnormal   Collection Time: 08/07/20 12:25 PM  Result Value Ref Range   Sodium 136 135 - 145 mmol/L   Potassium 4.4 3.5 - 5.1 mmol/L   Chloride 102 98 - 111 mmol/L   CO2 25 22 - 32 mmol/L   Glucose, Bld 108 (H) 70 - 99 mg/dL  Comment: Glucose reference range applies only to samples taken after fasting for at least 8 hours.   BUN 11 8 - 23 mg/dL   Creatinine, Ser 0.81 0.61 - 1.24 mg/dL   Calcium 9.0 8.9 - 10.3 mg/dL   Total Protein 7.2 6.5 - 8.1 g/dL   Albumin 3.9 3.5 - 5.0 g/dL   AST 29 15 - 41 U/L   ALT 20 0 - 44 U/L   Alkaline Phosphatase 63  38 - 126 U/L   Total Bilirubin 1.0 0.3 - 1.2 mg/dL   GFR, Estimated >60 >60 mL/min    Comment: (NOTE) Calculated using the CKD-EPI Creatinine Equation (2021)    Anion gap 9 5 - 15    Comment: Performed at [facility], [place] [address], [place], [place] [address]  CBC with Differential     Status: None   Collection Time: 08/07/20 12:25 PM  Result Value Ref Range   WBC 9.0 4.0 - 10.5 K/uL   RBC 4.93 4.22 - 5.81 MIL/uL   Hemoglobin 14.3 13.0 - 17.0 g/dL   HCT 41.7 39.0 - 52.0 %   MCV 84.6 80.0 - 100.0 fL   MCH 29.0 26.0 - 34.0 pg   MCHC 34.3 30.0 - 36.0 g/dL   RDW 14.7 11.5 - 15.5 %   Platelets 189 150 - 400 K/uL   nRBC 0.0 0.0 - 0.2 %   Neutrophils Relative % 70 %   Neutro Abs 6.3 1.7 - 7.7 K/uL   Lymphocytes Relative 19 %   Lymphs Abs 1.7 0.7 - 4.0 K/uL   Monocytes Relative 9 %   Monocytes Absolute 0.8 0.1 - 1.0 K/uL   Eosinophils Relative 1 %   Eosinophils Absolute 0.1 0.0 - 0.5 K/uL   Basophils Relative 1 %   Basophils Absolute 0.1 0.0 - 0.1 K/uL   Immature Granulocytes 0 %   Abs Immature Granulocytes 0.03 0.00 - 0.07 K/uL    Comment: Performed at [facility], [place] [address] [place], [place] [address]  Resp Panel by RT-PCR (Flu A&B, Covid) Nasopharyngeal Swab     Status: None   Collection Time: 08/07/20 12:25 PM   Specimen: Nasopharyngeal Swab; Nasopharyngeal(NP) swabs in vial transport medium  Result Value Ref Range   SARS Coronavirus 2 by RT PCR NEGATIVE NEGATIVE    Comment: (NOTE) SARS-CoV-2 target nucleic acids are NOT DETECTED.  The SARS-CoV-2 RNA is generally detectable in upper respiratory specimens during the acute phase of infection. The lowest concentration of SARS-CoV-2 viral copies this assay can detect is 138 copies/mL. A negative result does not preclude SARS-Cov-2 infection and should not be used as the sole basis for treatment or other patient management decisions. A negative result may occur with  improper  specimen collection/handling, submission of specimen other than nasopharyngeal swab, presence of viral mutation(s) within the areas targeted by this assay, and inadequate number of viral copies(<138 copies/mL). A negative result must be combined with clinical observations, patient history, and epidemiological information. The expected result is Negative.  Fact Sheet for Patients:  EntrepreneurPulse.com.au  Fact Sheet for Healthcare Providers:  IncredibleEmployment.be  This test is no t yet approved or cleared by the [place] FDA and  has been authorized for detection and/or diagnosis of SARS-CoV-2 by FDA under an Emergency Use Authorization (EUA). This EUA will remain  in effect (meaning this test can be used) for the duration of the COVID-19 declaration under Section 564(b)(1) of the Act, 21 U.S.C.section 360bbb-3(b)(1), unless the  authorization is terminated  or revoked sooner.       Influenza A by PCR NEGATIVE NEGATIVE   Influenza B by PCR NEGATIVE NEGATIVE    Comment: (NOTE) The Xpert Xpress SARS-CoV-2/FLU/RSV plus assay is intended as an aid in the diagnosis of influenza from Nasopharyngeal swab specimens and should not be used as a sole basis for treatment. Nasal washings and aspirates are unacceptable for Xpert Xpress SARS-CoV-2/FLU/RSV testing.  Fact Sheet for Patients: EntrepreneurPulse.com.au  Fact Sheet for Healthcare Providers: IncredibleEmployment.be  This test is not yet approved or cleared by the [place] FDA and has been authorized for detection and/or diagnosis of SARS-CoV-2 by FDA under an Emergency Use Authorization (EUA). This EUA will remain in effect (meaning this test can be used) for the duration of the COVID-19 declaration under Section 564(b)(1) of the Act, 21 U.S.C. section 360bbb-3(b)(1), unless the authorization is terminated or revoked.  Performed at [facility], [place] [address], [place], [place] [address]      Psychiatric Specialty Exam: Physical Exam  Review of Systems  Weight 260 lb (117.9 kg).Body mass index is 41.97 kg/m.  General Appearance: NA  Eye Contact:  NA  Speech:  Slow  Volume:  Normal  Mood:  Euthymic  Affect:  NA  Thought Process:  Goal Directed  Orientation:  Full (Time, Place, and Person)  Thought Content:  Logical  Suicidal Thoughts:  No  Homicidal Thoughts:  No  Memory:  Immediate;   Good Recent;   Good Remote;   Fair  Judgement:  Intact  Insight:  Present  Psychomotor Activity:  NA  Concentration:  Concentration: Fair and Attention Span: Fair  Recall:  Good  Fund of Knowledge:  Good  Language:  Good  Akathisia:  No  Handed:  Right  AIMS (if indicated):     Assets:  Communication Skills Desire for Improvement Housing Resilience Social Support  ADL's:  Intact  Cognition:  WNL  Sleep:   good      Assessment and Plan: Bipolar disorder type I.  Generalized anxiety disorder.  I reviewed blood work results from the recent ER visit.  Patient is stable on his current medication.  He has mild tremors but does not interfere in his daily routine.  Discussed healthy lifestyle and watch his calorie intake.  Patient does not want to change the medication since it is working.  I will continue Abilify injection 400 mg intramuscular every 4 weeks, temazepam 30 mg at bedtime, Celexa 20 mg daily, Cogentin 1 mg at bedtime and trazodone 50 mg at bedtime.  Discussed medication side effects and benefits specially discussed polypharmacy.  Recommended to call us back if is any question or any concern.  Follow-up in 3 months.  Follow Up Instructions:    I discussed the assessment and treatment plan with the patient. The patient was provided an opportunity to ask questions and all were answered. The patient agreed with the plan and demonstrated an understanding of the instructions.   The patient was advised  to call back or seek an in-person evaluation if the symptoms worsen or if the condition fails to improve as anticipated.  I provided 15 minutes of non-face-to-face time during this encounter.   [name], MD

## 2020-09-25 ENCOUNTER — Ambulatory Visit (INDEPENDENT_AMBULATORY_CARE_PROVIDER_SITE_OTHER): Payer: Medicare HMO

## 2020-09-25 DIAGNOSIS — Z0000 Encounter for general adult medical examination without abnormal findings: Secondary | ICD-10-CM

## 2020-09-25 DIAGNOSIS — H353222 Exudative age-related macular degeneration, left eye, with inactive choroidal neovascularization: Secondary | ICD-10-CM | POA: Diagnosis not present

## 2020-09-25 DIAGNOSIS — H353211 Exudative age-related macular degeneration, right eye, with active choroidal neovascularization: Secondary | ICD-10-CM | POA: Diagnosis not present

## 2020-09-25 NOTE — Patient Instructions (Signed)
Sean Hill , Thank you for taking time to come for your Medicare Wellness Visit. I appreciate your ongoing commitment to your health goals. Please review the following plan we discussed and let me know if I can assist you in the future.   Screening recommendations/referrals: Colonoscopy: Up to date, next due 04/29/2021 Recommended yearly ophthalmology/optometry visit for glaucoma screening and checkup Recommended yearly dental visit for hygiene and checkup  Vaccinations: Influenza vaccine: Up to date, next due fall 2022  Pneumococcal vaccine: Completed series Tdap vaccine: Up to date, next due 03/27/2026 Shingles vaccine: Currently due for Shingrix, if you ish to receive we recommend that you do so at your local pharmacy    Advanced directives: Please bring copies of your advanced medical directives into our office so that we may scan them into your chart.  Conditions/risks identified: None   Next appointment: None   Preventive Care 65 Years and Older, Male Preventive care refers to lifestyle choices and visits with your health care provider that can promote health and wellness. What does preventive care include?  A yearly physical exam. This is also called an annual well check.  Dental exams once or twice a year.  Routine eye exams. Ask your health care provider how often you should have your eyes checked.  Personal lifestyle choices, including:  Daily care of your teeth and gums.  Regular physical activity.  Eating a healthy diet.  Avoiding tobacco and drug use.  Limiting alcohol use.  Practicing safe sex.  Taking low doses of aspirin every day.  Taking vitamin and mineral supplements as recommended by your health care provider. What happens during an annual well check? The services and screenings done by your health care provider during your annual well check will depend on your age, overall health, lifestyle risk factors, and family history of  disease. Counseling  Your health care provider may ask you questions about your:  Alcohol use.  Tobacco use.  Drug use.  Emotional well-being.  Home and relationship well-being.  Sexual activity.  Eating habits.  History of falls.  Memory and ability to understand (cognition).  Work and work Statistician. Screening  You may have the following tests or measurements:  Height, weight, and BMI.  Blood pressure.  Lipid and cholesterol levels. These may be checked every 5 years, or more frequently if you are over 22 years old.  Skin check.  Lung cancer screening. You may have this screening every year starting at age 54 if you have a 30-pack-year history of smoking and currently smoke or have quit within the past 15 years.  Fecal occult blood test (FOBT) of the stool. You may have this test every year starting at age 77.  Flexible sigmoidoscopy or colonoscopy. You may have a sigmoidoscopy every 5 years or a colonoscopy every 10 years starting at age 33.  Prostate cancer screening. Recommendations will vary depending on your family history and other risks.  Hepatitis C blood test.  Hepatitis B blood test.  Sexually transmitted disease (STD) testing.  Diabetes screening. This is done by checking your blood sugar (glucose) after you have not eaten for a while (fasting). You may have this done every 1-3 years.  Abdominal aortic aneurysm (AAA) screening. You may need this if you are a current or former smoker.  Osteoporosis. You may be screened starting at age 67 if you are at high risk. Talk with your health care provider about your test results, treatment options, and if necessary, the need for more  tests. Vaccines  Your health care provider may recommend certain vaccines, such as:  Influenza vaccine. This is recommended every year.  Tetanus, diphtheria, and acellular pertussis (Tdap, Td) vaccine. You may need a Td booster every 10 years.  Zoster vaccine. You may  need this after age 74.  Pneumococcal 13-valent conjugate (PCV13) vaccine. One dose is recommended after age 50.  Pneumococcal polysaccharide (PPSV23) vaccine. One dose is recommended after age 68. Talk to your health care provider about which screenings and vaccines you need and how often you need them. This information is not intended to replace advice given to you by your health care provider. Make sure you discuss any questions you have with your health care provider. Document Released: 10/02/2015 Document Revised: 05/25/2016 Document Reviewed: 07/07/2015 Elsevier Interactive Patient Education  2017 [place] Prevention in the Home Falls can cause injuries. They can happen to people of all ages. There are many things you can do to make your home safe and to help prevent falls. What can I do on the outside of my home?  Regularly fix the edges of walkways and driveways and fix any cracks.  Remove anything that might make you trip as you walk through a door, such as a raised step or threshold.  Trim any bushes or trees on the path to your home.  Use bright outdoor lighting.  Clear any walking paths of anything that might make someone trip, such as rocks or tools.  Regularly check to see if handrails are loose or broken. Make sure that both sides of any steps have handrails.  Any raised decks and porches should have guardrails on the edges.  Have any leaves, snow, or ice cleared regularly.  Use sand or salt on walking paths during winter.  Clean up any spills in your garage right away. This includes oil or grease spills. What can I do in the bathroom?  Use night lights.  Install grab bars by the toilet and in the tub and shower. Do not use towel bars as grab bars.  Use non-skid mats or decals in the tub or shower.  If you need to sit down in the shower, use a plastic, non-slip stool.  Keep the floor dry. Clean up any water that spills on the floor as soon as it  happens.  Remove soap buildup in the tub or shower regularly.  Attach bath mats securely with double-sided non-slip rug tape.  Do not have throw rugs and other things on the floor that can make you trip. What can I do in the bedroom?  Use night lights.  Make sure that you have a light by your bed that is easy to reach.  Do not use any sheets or blankets that are too big for your bed. They should not hang down onto the floor.  Have a firm chair that has side arms. You can use this for support while you get dressed.  Do not have throw rugs and other things on the floor that can make you trip. What can I do in the kitchen?  Clean up any spills right away.  Avoid walking on wet floors.  Keep items that you use a lot in easy-to-reach places.  If you need to reach something above you, use a strong step stool that has a grab bar.  Keep electrical cords out of the way.  Do not use floor polish or wax that makes floors slippery. If you must use wax, use non-skid floor  wax.  Do not have throw rugs and other things on the floor that can make you trip. What can I do with my stairs?  Do not leave any items on the stairs.  Make sure that there are handrails on both sides of the stairs and use them. Fix handrails that are broken or loose. Make sure that handrails are as long as the stairways.  Check any carpeting to make sure that it is firmly attached to the stairs. Fix any carpet that is loose or worn.  Avoid having throw rugs at the top or bottom of the stairs. If you do have throw rugs, attach them to the floor with carpet tape.  Make sure that you have a light switch at the top of the stairs and the bottom of the stairs. If you do not have them, ask someone to add them for you. What else can I do to help prevent falls?  Wear shoes that:  Do not have high heels.  Have rubber bottoms.  Are comfortable and fit you well.  Are closed at the toe. Do not wear sandals.  If you  use a stepladder:  Make sure that it is fully opened. Do not climb a closed stepladder.  Make sure that both sides of the stepladder are locked into place.  Ask someone to hold it for you, if possible.  Clearly mark and make sure that you can see:  Any grab bars or handrails.  First and last steps.  Where the edge of each step is.  Use tools that help you move around (mobility aids) if they are needed. These include:  Canes.  Walkers.  Scooters.  Crutches.  Turn on the lights when you go into a dark area. Replace any light bulbs as soon as they burn out.  Set up your furniture so you have a clear path. Avoid moving your furniture around.  If any of your floors are uneven, fix them.  If there are any pets around you, be aware of where they are.  Review your medicines with your doctor. Some medicines can make you feel dizzy. This can increase your chance of falling. Ask your doctor what other things that you can do to help prevent falls. This information is not intended to replace advice given to you by your health care provider. Make sure you discuss any questions you have with your health care provider. Document Released: 07/02/2009 Document Revised: 02/11/2016 Document Reviewed: 10/10/2014 Elsevier Interactive Patient Education  2017 [employer].

## 2020-09-25 NOTE — Progress Notes (Signed)
Subjective:   Sean Hill is a 80 y.o. male who presents for an Initial Medicare Annual Wellness Visit.  I connected with [name] today by telephone and verified that I am speaking with the correct person using two identifiers. Location patient: home Location provider: work Persons participating in the virtual visit: patient, provider.   I discussed the limitations, risks, security and privacy concerns of performing an evaluation and management service by telephone and the availability of in person appointments. I also discussed with the patient that there may be a patient responsible charge related to this service. The patient expressed understanding and verbally consented to this telephonic visit.    Interactive audio and video telecommunications were attempted between this provider and patient, however failed, due to patient having technical difficulties OR patient did not have access to video capability.  We continued and completed visit with audio only.      Review of Systems    N/A  Cardiac Risk Factors include: advanced age (>89men, >88 women);hypertension;male gender;dyslipidemia     Objective:    There were no vitals filed for this visit. There is no height or weight on file to calculate BMI.  Advanced Directives 09/25/2020 08/07/2020 04/29/2020 10/09/2019 10/08/2019 10/02/2019 10/26/2017  Does Patient Have a Medical Advance Directive? Yes Yes Yes Yes No;Yes Yes Yes  Type of Paramedic of [place];Living will [place];Living will [place];Living will Living will;Healthcare Power of [place];Living will [place];Living will Living will  Does patient want to make changes to medical advance directive? No - Patient declined - - No - Patient declined Yes (ED - Information included in AVS) - No - Patient declined  Copy of [place] in Chart?  No - copy requested - No - copy requested No - copy requested No - copy requested No - copy requested -  Would patient like information on creating a medical advance directive? - - - No - Patient declined Yes (ED - Information included in AVS) - -  Pre-existing out of facility DNR order (yellow form or pink MOST form) - - - - - - -  Some encounter information is confidential and restricted. Go to Review Flowsheets activity to see all data.    Current Medications (verified) Outpatient Encounter Medications as of 09/25/2020  Medication Sig  . ARIPiprazole ER (ABILIFY MAINTENA) 400 MG PRSY prefilled syringe Inject 400 mg into the muscle every 28 (twenty-eight) days.  . benztropine (COGENTIN) 1 MG tablet Take 1 tablet (1 mg total) by mouth daily.  . citalopram (CELEXA) 20 MG tablet Take 1 tablet (20 mg total) by mouth daily.  Marland Kitchen doxycycline (VIBRAMYCIN) 100 MG capsule Take 1 capsule (100 mg total) by mouth 2 (two) times daily.  Marland Kitchen ezetimibe (ZETIA) 10 MG tablet Take 1 tablet (10 mg total) by mouth daily.  . fluticasone (FLONASE) 50 MCG/ACT nasal spray Place 1 spray into both nostrils in the morning and at bedtime.  . Multiple Vitamin (MULTIVITAMIN WITH MINERALS) TABS tablet Take 1 tablet by mouth daily.  . Multiple Vitamins-Minerals (PRESERVISION AREDS 2 PO) Take 1 capsule by mouth 2 (two) times daily.  . Omega-3 Fatty Acids (FISH OIL PO) Take 1 capsule by mouth daily.   . pantoprazole (PROTONIX) 40 MG tablet Take 1 tablet (40 mg total) by mouth 2 (two) times daily.  . temazepam (RESTORIL) 30 MG capsule Take 1 capsule (30 mg total) by mouth at  bedtime.  . traZODone (DESYREL) 50 MG tablet Take 1 tablet (50 mg total) by mouth at bedtime.  . Vitamin D, Cholecalciferol, 1000 UNITS TABS Take 1,000 Units by mouth daily.   Marland Kitchen albuterol (VENTOLIN HFA) 108 (90 Base) MCG/ACT inhaler Inhale 2 puffs into the lungs every 6 (six) hours as needed for wheezing or shortness of breath. (Patient not taking: Reported on  09/25/2020)  . losartan (COZAAR) 50 MG tablet Take 1 tablet (50 mg total) by mouth daily. (Patient not taking: Reported on 09/25/2020)   Facility-Administered Encounter Medications as of 09/25/2020  Medication  . ARIPiprazole ER (ABILIFY MAINTENA) 400 MG prefilled syringe 400 mg    Allergies (verified) Penicillins, Lisinopril, and Codeine   History: Past Medical History:  Diagnosis Date  . Anal fissure   . Anxiety   . Arthritis    right wrist; pt. states "everywhere"  . Asthma   . Bipolar disorder (HCC)   . Cataract   . Depression   . GERD (gastroesophageal reflux disease)   . Hemorrhage of colon following colonoscopy 10/08/2019  . History of kidney stones   . 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 Hyperlipidemia   . Hypertension    states under control with med., has been on med. x 1 yr.  . Impaired hearing   . Left bundle branch block (LBBB) on electrocardiogram 10/29/2015  . Macular degeneration (senile) of retina    left  . Wears glasses    Past Surgical History:  Procedure Laterality Date  . ANKLE FUSION Bilateral   . ANKLE SURGERY Bilateral    ligament surgery  . BIOPSY  04/29/2020   Procedure: BIOPSY;  Surgeon: Shellia Cleverly, DO;  Location: [facility];  Service: Gastroenterology;;  . CARDIAC CATHETERIZATION  x 2   1983; 11/14/2003  . CARPOMETACARPEL SUSPENSION PLASTY Right 09/22/2016   Procedure: right thumb carpometacarpal  ARTHROPLASTY;  Surgeon: Betha Loa, MD;  Location: [facility];  Service: Orthopedics;  Laterality: Right;  right thumb carpometacarpal  ARTHROPLASTY  . CARPOMETACARPEL SUSPENSION PLASTY Right 11/02/2017   Procedure: RIGHT THUMB SUSPENSIONPLASTY WITH TIGHTROPE, DISTAL POLE SCAPHOID  EXCISION;  Surgeon: Betha Loa, MD;  Location: [facility];  Service: Orthopedics;  Laterality: Right;  . COLONOSCOPY WITH PROPOFOL N/A 10/02/2019   Procedure: COLONOSCOPY WITH PROPOFOL;  Surgeon: Shellia Cleverly, DO;  Location: [facility];  Service: Gastroenterology;  Laterality: N/A;  . COLONOSCOPY WITH PROPOFOL N/A 10/09/2019   Procedure: COLONOSCOPY WITH PROPOFOL;  Surgeon: Iva Boop, MD;  Location: [facility];  Service: Endoscopy;  Laterality: N/A;  . COLONOSCOPY WITH PROPOFOL N/A 04/29/2020   Procedure: COLONOSCOPY WITH PROPOFOL;  Surgeon: Shellia Cleverly, DO;  Location: [facility];  Service: Gastroenterology;  Laterality: N/A;  . ELBOW SURGERY Left   . ENDOSCOPIC MUCOSAL RESECTION N/A 10/02/2019   Procedure: ENDOSCOPIC MUCOSAL RESECTION;  Surgeon: Shellia Cleverly, DO;  Location: [facility];  Service: Gastroenterology;  Laterality: N/A;  . HEMOSTASIS CLIP PLACEMENT  10/09/2019   Procedure: HEMOSTASIS CLIP PLACEMENT;  Surgeon: Iva Boop, MD;  Location: [facility];  Service: Endoscopy;;  . INGUINAL HERNIA REPAIR Right   . OPEN REDUCTION INTERNAL FIXATION (ORIF) DISTAL RADIAL FRACTURE Right 10/29/2015   Procedure: OPEN REDUCTION INTERNAL FIXATION (ORIF) DISTAL RADIAL FRACTURE;  Surgeon: Betha Loa, MD;  Location: [facility];  Service: Orthopedics;  Laterality: Right;  . ORIF ELBOW FRACTURE Right   . POLYPECTOMY  04/29/2020   Procedure:  POLYPECTOMY;  Surgeon: Lavena Bullion, DO;  Location: [facility];  Service: Gastroenterology;;  . Lia Foyer LIFTING INJECTION  10/02/2019   Procedure: SUBMUCOSAL LIFTING INJECTION;  Surgeon: Lavena Bullion, DO;  Location: [facility];  Service: Gastroenterology;;  . TONSILLECTOMY    . TOTAL KNEE ARTHROPLASTY Bilateral   . ULNAR COLLATERAL LIGAMENT REPAIR Right 12/29/2015   Procedure: REPAIR  RIGHT LATERAL ULNAR COLLATERAL LIGAMENT TEAR  EXTENSOR ORIGIN ;  Surgeon: Leanora Cover, MD;  Location: [facility];  Service: Orthopedics;  Laterality: Right;  . WRIST ARTHROSCOPY WITH DEBRIDEMENT Right 07/14/2016   Procedure: RIGHT WRIST ARTHROSCOPY WITH DEBRIDEMENT  TRIANGULAR FIBROCARTILAGE COMPLEX;  Surgeon: Leanora Cover, MD;  Location: [facility];  Service: Orthopedics;  Laterality: Right;   Family History  Problem Relation Age of Onset  . Alcohol abuse Mother   . Ovarian cancer Mother   . Alcohol abuse Father   . Pancreatic cancer Father   . Depression Daughter   . Paranoid behavior Daughter   . Hyperlipidemia Brother   . Barrett's esophagus Brother   . Colon cancer Maternal Grandmother   . Esophageal cancer Neg Hx   . Stomach cancer Neg Hx   . Rectal cancer Neg Hx    Social History   Socioeconomic History  . Marital status: Married    Spouse name: Not on file  . Number of children: Not on file  . Years of education: Not on file  . Highest education level: Not on file  Occupational History  . Not on file  Tobacco Use  . Smoking status: Never Smoker  . Smokeless tobacco: Never Used  Vaping Use  . Vaping Use: Never used  Substance and Sexual Activity  . Alcohol use: Not Currently  . Drug use: No  . Sexual activity: Not Currently    Birth control/protection: None  Other Topics Concern  . Not on file  Social History Narrative   Married, lives in [place].  Retired Dance movement psychotherapist.   Two daughters, both married,3 grandchildren ( 55, 56, 1 year)    No regular exercise.  Never smoker.  No ETOH.  No drugs.   Social Determinants of Health   Financial Resource Strain: Low Risk   . Difficulty of Paying Living Expenses: Not hard at all  Food Insecurity: No Food Insecurity  . Worried About Charity fundraiser in the Last Year: Never true  . Ran Out of Food in the Last Year: Never true  Transportation Needs: No Transportation Needs  . Lack of Transportation (Medical): No  . Lack of Transportation (Non-Medical): No  Physical Activity: Inactive  . Days of Exercise per Week: 0 days  . Minutes of Exercise per Session: 0 min  Stress: No Stress Concern Present  . Feeling of Stress : Not at all  Social Connections: Moderately Isolated  . Frequency of Communication with  Friends and Family: Once a week  . Frequency of Social Gatherings with Friends and Family: More than three times a week  . Attends Religious Services: Never  . Active Member of Clubs or Organizations: No  . Attends Archivist Meetings: Never  . Marital Status: Married    Tobacco Counseling Counseling given: Not Answered   Clinical Intake:  Pre-visit preparation completed: Yes  Pain : No/denies pain     Nutritional Risks: Nausea/ vomitting/ diarrhea (vomiting, diarrhea) Diabetes: No  How often do you need to have someone help you when you read instructions, pamphlets, or other written  materials from your doctor or pharmacy?: 1 - Never What is the last grade level you completed in school?: 11th grade  Diabetic?No   Interpreter Needed?: No  Information entered by :: [place] of Daily Living In your present state of health, do you have any difficulty performing the following activities: 09/25/2020 10/09/2019  Hearing? Y -  Comment Patient states has hearing aids, and ringing in ears -  Vision? N -  Difficulty concentrating or making decisions? Y -  Comment patient states medication side effects make him a little forgetful -  Walking or climbing stairs? Y -  Comment patient states has some difficulties coming down stairs do to pain -  Dressing or bathing? N -  Doing errands, shopping? N N  Preparing Food and eating ? N -  Using the Toilet? N -  In the past six months, have you accidently leaked urine? N -  Do you have problems with loss of bowel control? N -  Managing your Medications? N -  Managing your Finances? N -  Housekeeping or managing your Housekeeping? N -  Some recent data might be hidden    Patient Care Team: [name], NP as PCP - General (Family Medicine) Leanora Cover, MD as Consulting Physician (Orthopedic Surgery) Viona Gilmore, [facility] as Pharmacist (Pharmacist)  Indicate any recent Medical Services you may have  received from other than [facility] providers in the past year (date may be approximate).     Assessment:   This is a routine wellness examination for Patric.  Hearing/Vision screen  Hearing Screening   125Hz  250Hz  500Hz  1000Hz  2000Hz  3000Hz  4000Hz  6000Hz  8000Hz   Right ear:           Left ear:           Vision Screening Comments: Patient states gets eyes checked every year. Also has macular degeneration and gets eye injections   Dietary issues and exercise activities discussed: Current Exercise Habits: The patient does not participate in regular exercise at present, Exercise limited by: orthopedic condition(s)  Goals    . Exercise 3x per week (30 min per time)    . Pharmacy Care Plan     CARE PLAN ENTRY (see longitudinal plan of care for additional care plan information)  Current Barriers:  . Chronic Disease Management support, education, and care coordination needs related to Hypertension, Hyperlipidemia, GERD, and Bipolar disorder, Insomnia, glucose intolerance    Hypertension BP Readings from Last 3 Encounters:  04/17/20 (!) 161/83  03/20/20 130/75  02/21/20 (!) 146/74   . Pharmacist Clinical Goal(s): o Over the next 120 days, patient will work with PharmD and providers to maintain BP goal <130/80 . Current regimen:  o Lisinopril 20mg , 2 tablet once daily  . Patient self care activities - Over the next 120 days, patient will: o Check BP 1 to 2 times per week, document, and provide at future appointments o Ensure daily salt intake < 2300 mg/day  Hyperlipidemia Lab Results  Component Value Date/Time   LDLDIRECT 72.0 12/25/2019 08:23 AM   Triglycerides  Date Value Ref Range Status  12/25/2019 265.0 (H) 0 - 149 mg/dL Final    Comment:    Normal:  <150 mg/dLBorderline High:  150 - 199 mg/dL .  Pharmacist Clinical Goal(s): o Over the next 120 days, patient will work with PharmD and providers to maintain LDL goal < 100 and achieve triglyceride goal: <150 . Current regimen:   o No medications currently.  . Interventions: o  Reviewed formulary for ezetimibe. It is tier 4 medication, therefore, has $150 deductible. After paying deductible, copay: ~$25 for 3 month supply.  o Recommend restarting fenofibrate and ezetimibe.  . Patient self care activities - Over the next 120 days, patient will: o Continue current medications as directed by provider.   Glucose intolerance Lab Results  Component Value Date/Time   HGBA1C 6.0 12/25/2019 08:23 AM   HGBA1C 6.1 11/07/2018 07:41 AM   . Pharmacist Clinical Goal(s): o Over the next 120 days, patient will work with PharmD and providers to maintain A1c goal <6.5% . Current regimen:  o No medications . Interventions: o Recommend:   Modifying lifestyle, including to participate in moderate physical activity (e.g., walking) at least 150 minutes per week.   Mediterranean eating plan with an emphasis on whole grains, legumes, nuts, fruits, and vegetables and minimal refined and processed foods.  . Patient self care activities - Over the next 120 days, patient will: o Continue working on keeping blood pressure and lipids under goal.   GERD . Pharmacist Clinical Goal(s) o Over the next 120 days, patient will work with PharmD and providers to reduce acid reflux . Current regimen:  o Pantoprazole 40mg , 1 tablet twice daily . Interventions: o Recommend: non-pharmacological interventions for acid reflux. Take measures to prevent acid reflux, such as avoiding spicy foods, avoiding caffeine, avoid laying down a few hours after eating, and raising the head of the bed . Patient self care activities - Over the next 120 days, patient will: o Continue current medications as directed by provider.   Insomnia . Pharmacist Clinical Goal(s) o Over the next 120 days, patient will work with PharmD and providers to improve sleep.  . Current regimen:  o Temazepam 30mg , 1 capsule at bedtime  . Patient self care activities o Patient will  continue current medications as directed by provider.   Bipolar disorder . Pharmacist Clinical Goal(s) o Over the next 120 days, patient will work with PharmD and providers to maintain/ stabilize mood symptoms.  . Current regimen:  . Aripiprazole ER 400mg , inject 400mg  every twenty-eight days  . Citalopram 20mg , 1 tablet once daily . Trazodone 50mg , 1 tablet at bedtime  . Benztropine 1mg , 1 tablet once daily (for tardive dyskinesia prevention) . Patient self care activities o Patient will continue current medications as directed by provider.   Medication management . Pharmacist Clinical Goal(s): o Over the next 120 days, patient will work with PharmD and providers to maintain optimal medication adherence . Current pharmacy: [facility] . Interventions o Comprehensive medication review performed. o Continue current medication management strategy . Patient self care activities - Over the next 120 days, patient will: o Take medications as prescribed o Report any questions or concerns to PharmD and/or provider(s)  Please see past updates related to this goal by clicking on the "Past Updates" button in the selected goal        Depression Screen PHQ 2/9 Scores 09/25/2020 11/07/2018 09/21/2017 01/02/2015 12/02/2013  PHQ - 2 Score 0 0 0 0 4  Some encounter information is confidential and restricted. Go to Review Flowsheets activity to see all data.    Fall Risk Fall Risk  09/25/2020 11/07/2018 09/21/2017 01/02/2015 12/02/2013  Falls in the past year? 0 0 No Yes No  Number falls in past yr: 0 - - 1 -  Injury with Fall? 0 - - Yes -  Risk for fall due to : History of fall(s) - - - -  Follow up Falls  evaluation completed - - - -    FALL RISK PREVENTION PERTAINING TO THE HOME:  Any stairs in or around the home? Yes  If so, are there any without handrails? No  Home free of loose throw rugs in walkways, pet beds, electrical cords, etc? Yes  Adequate lighting in your home to reduce risk of falls? Yes    ASSISTIVE DEVICES UTILIZED TO PREVENT FALLS:  Life alert? No  Use of a cane, walker or w/c? No  Grab bars in the bathroom? Yes  Shower chair or bench in shower? No  Elevated toilet seat or a handicapped toilet? Yes   Cognitive Function:   Normal cognitive status assessed by direct observation by this Nurse Health Advisor. No abnormalities found.        Immunizations Immunization History  Administered Date(s) Administered  . Fluad Quad(high Dose 65+) 05/29/2019  . Influenza Inj Mdck Quad Pf 05/29/2019  . Influenza, High Dose Seasonal PF 06/07/2016, 05/15/2017  . Influenza-Unspecified 07/20/2013, 06/16/2020  . PFIZER SARS-COV-2 Vaccination 10/25/2019, 11/19/2019, 06/16/2020  . Pneumococcal Conjugate-13 03/11/2015  . Pneumococcal Polysaccharide-23 07/06/2010  . Tdap 07/06/2010, 10/29/2015    TDAP status: Up to date  Flu Vaccine status: Due, Education has been provided regarding the importance of this vaccine. Advised may receive this vaccine at local pharmacy or Health Dept. Aware to provide a copy of the vaccination record if obtained from local pharmacy or Health Dept. Verbalized acceptance and understanding.  Pneumococcal vaccine status: Up to date  Covid-19 vaccine status: Completed vaccines  Qualifies for Shingles Vaccine? Yes   Zostavax completed No   Shingrix Completed?: No.    Education has been provided regarding the importance of this vaccine. Patient has been advised to call insurance company to determine out of pocket expense if they have not yet received this vaccine. Advised may also receive vaccine at local pharmacy or Health Dept. Verbalized acceptance and understanding.  Screening Tests Health Maintenance  Topic Date Due  . Hepatitis C Screening  Never done  . TETANUS/TDAP  10/28/2025  . INFLUENZA VACCINE  Completed  . COVID-19 Vaccine  Completed  . PNA vac Low Risk Adult  Completed    Health Maintenance  Health Maintenance Due  Topic Date Due  .  Hepatitis C Screening  Never done    Colorectal cancer screening: Type of screening: Colonoscopy. Completed 04/29/2020. Repeat every 1 years  Lung Cancer Screening: (Low Dose CT Chest recommended if Age 86-80 years, 30 pack-year currently smoking OR have quit w/in 15years.) does not qualify.   Lung Cancer Screening Referral: N/A  Additional Screening:  Hepatitis C Screening: does qualify;   Vision Screening: Recommended annual ophthalmology exams for early detection of glaucoma and other disorders of the eye. Is the patient up to date with their annual eye exam?  Yes  Who is the provider or what is the name of the office in which the patient attends annual eye exams? Dr.Sanders If pt is not established with a provider, would they like to be referred to a provider to establish care? No .   Dental Screening: Recommended annual dental exams for proper oral hygiene  Community Resource Referral / Chronic Care Management: CRR required this visit?  No   CCM required this visit?  No      Plan:     I have personally reviewed and noted the following in the patient's chart:   . Medical and social history . Use of alcohol, tobacco or illicit drugs  . Current  medications and supplements . Functional ability and status . Nutritional status . Physical activity . Advanced directives . List of other physicians . Hospitalizations, surgeries, and ER visits in previous 12 months . Vitals . Screenings to include cognitive, depression, and falls . Referrals and appointments  In addition, I have reviewed and discussed with patient certain preventive protocols, quality metrics, and best practice recommendations. A written personalized care plan for preventive services as well as general preventive health recommendations were provided to patient.     Theodora Blow, LPN   09/23/4006   Nurse Notes: None

## 2020-09-28 ENCOUNTER — Telehealth: Payer: Self-pay | Admitting: Pharmacist

## 2020-09-28 NOTE — Chronic Care Management (AMB) (Signed)
Chronic Care Management Pharmacy Assistant   Name: Sean Hill  MRN: [id number] DOB: [birth year]  Reason for Encounter: Medication Review   PCP : [name], NP  Allergies:   Allergies  Allergen Reactions  . Penicillins Other (See Comments)    BLISTERS Did it involve swelling of the face/tongue/throat, SOB, or low BP? No Did it involve sudden or severe rash/hives, skin peeling, or any reaction on the inside of your mouth or nose? No Did you need to seek medical attention at a hospital or doctor's office? No When did it last happen?around 1980 If all above answers are "NO", may proceed with cephalosporin use.   Marland Kitchen Lisinopril     Dry cough, abdominal bloating and diarrhea  . Codeine Rash    Medications: Outpatient Encounter Medications as of 09/28/2020  Medication Sig  . albuterol (VENTOLIN HFA) 108 (90 Base) MCG/ACT inhaler Inhale 2 puffs into the lungs every 6 (six) hours as needed for wheezing or shortness of breath. (Patient not taking: Reported on 09/25/2020)  . ARIPiprazole ER (ABILIFY MAINTENA) 400 MG PRSY prefilled syringe Inject 400 mg into the muscle every 28 (twenty-eight) days.  . benztropine (COGENTIN) 1 MG tablet Take 1 tablet (1 mg total) by mouth daily.  . citalopram (CELEXA) 20 MG tablet Take 1 tablet (20 mg total) by mouth daily.  Marland Kitchen doxycycline (VIBRAMYCIN) 100 MG capsule Take 1 capsule (100 mg total) by mouth 2 (two) times daily.  Marland Kitchen ezetimibe (ZETIA) 10 MG tablet Take 1 tablet (10 mg total) by mouth daily.  . fluticasone (FLONASE) 50 MCG/ACT nasal spray Place 1 spray into both nostrils in the morning and at bedtime.  Marland Kitchen losartan (COZAAR) 50 MG tablet Take 1 tablet (50 mg total) by mouth daily. (Patient not taking: Reported on 09/25/2020)  . Multiple Vitamin (MULTIVITAMIN WITH MINERALS) TABS tablet Take 1 tablet by mouth daily.  . Multiple Vitamins-Minerals (PRESERVISION AREDS 2 PO) Take 1 capsule by mouth 2 (two) times daily.  . Omega-3 Fatty Acids  (FISH OIL PO) Take 1 capsule by mouth daily.   . pantoprazole (PROTONIX) 40 MG tablet Take 1 tablet (40 mg total) by mouth 2 (two) times daily.  . temazepam (RESTORIL) 30 MG capsule Take 1 capsule (30 mg total) by mouth at bedtime.  . traZODone (DESYREL) 50 MG tablet Take 1 tablet (50 mg total) by mouth at bedtime.  . Vitamin D, Cholecalciferol, 1000 UNITS TABS Take 1,000 Units by mouth daily.    Facility-Administered Encounter Medications as of 09/28/2020  Medication  . ARIPiprazole ER (ABILIFY MAINTENA) 400 MG prefilled syringe 400 mg    Current Diagnosis: Patient Active Problem List   Diagnosis Date Noted  . Bipolar affective disorder, currently depressed, mild ([place]) 10/21/2019  . Hemorrhage of colon following colonoscopy 10/08/2019  . Adenomatous polyp of ascending colon   . Tubulovillous adenoma of colon   . History of colonic polyps   . Diverticulosis of colon without hemorrhage   . Grade I internal hemorrhoids   . Essential hypertension 08/23/2016  . Left bundle branch block (LBBB) on electrocardiogram 06/10/2016  . Hyperlipidemia 06/07/2016  . Left leg swelling 01/06/2015  . Change in bowel habits 01/06/2015  . Gastroesophageal reflux disease 11/28/2011  . Bipolar disorder ([place]) 11/25/2011    Goals Addressed   None     Reviewed chart for medication changes ahead of medication coordination call. No OVs, Consults, or hospital visits since last care coordination call/Pharmacist visit.  No medication changes indicated OR if  recent visit, treatment plan here.  BP Readings from Last 3 Encounters:  08/07/20 136/72  04/29/20 (!) 144/56  02/21/20 (!) 146/74    Lab Results  Component Value Date   HGBA1C 6.0 12/25/2019     Patient obtains medications through Adherence Packaging  90 Days   Last adherence delivery included:  Marland Kitchen Citalopram (CELEXA) 20 mg: one tablet at breakfast . Trazodone (DESYREL) 50 mg: one capsule at bedtime . Benztropine (COGENTIN) 1 mg: one tablet  at lunch . Ezetimibe (ZETIA) 10 mg: one tablet at lunch . Temazepam (RESTORIL) 30 mg: one capsule at bedtime . Chlorpheniramine 4 mg . Omega-3 Fatty Acids 1,000 Mg Capsule . Preservision Areds-2g . Men's 50 plus multivitamin: one tablet at breakfast . Vitamin D3 25 Mcg: one tablet at breakfast . Pantoprazole (PROTONIX) 40 mg: one tablet at breakfast and at dinner (Added)  Patient declined the following medication last month due to PRN use. . Albuterol Aer Hfa (PRN) . Tobramycin 0.3% eyedrops (PRN)  I spoke with the patient and review medications. There are no changes in medications currently. Patient declined these last month due to PRN use/additional supply on hand.The patient is taking the following medications: . Citalopram (CELEXA) 20 mg: one tablet at breakfast . Trazodone (DESYREL) 50 mg: one capsule at bedtime . Benztropine (COGENTIN) 1 mg: one tablet at lunch . Ezetimibe (ZETIA) 10 mg: one tablet at lunch . Temazepam (RESTORIL) 30 mg: one capsule at bedtime . Chlorpheniramine 4 mg . Omega-3 Fatty Acids 1,000 Mg Capsule . Preservision Areds-2g . Men's 50 plus multivitamin: one tablet at breakfast . Vitamin D3 25 Mcg: one tablet at breakfast . Pantoprazole (PROTONIX) 40 mg: one tablet at breakfast and at dinner (Added) . Albuterol Aer Hfa (PRN) . Tobramycin 0.3% eyedrops (PRN)  He currently does not need refills. Confirmed delivery date of 11/18/2020, advised patient that pharmacy will contact them the morning of delivery. Follow-Up:  Coordination of Enhanced Pharmacy Services and Pharmacist Review   Maia Breslow, [place] Assistant [phone]

## 2020-10-01 DIAGNOSIS — F411 Generalized anxiety disorder: Secondary | ICD-10-CM | POA: Diagnosis not present

## 2020-10-01 DIAGNOSIS — R69 Illness, unspecified: Secondary | ICD-10-CM | POA: Diagnosis not present

## 2020-10-19 ENCOUNTER — Other Ambulatory Visit: Payer: Self-pay

## 2020-10-19 ENCOUNTER — Ambulatory Visit (INDEPENDENT_AMBULATORY_CARE_PROVIDER_SITE_OTHER): Payer: Medicare HMO | Admitting: *Deleted

## 2020-10-19 VITALS — BP 146/76 | HR 75 | Resp 20 | Ht 65.0 in | Wt 264.6 lb

## 2020-10-19 DIAGNOSIS — F319 Bipolar disorder, unspecified: Secondary | ICD-10-CM

## 2020-10-19 DIAGNOSIS — R69 Illness, unspecified: Secondary | ICD-10-CM | POA: Diagnosis not present

## 2020-10-19 NOTE — Progress Notes (Signed)
Pt presents today for due Abilify Maintena 400 mg injection. Pt is pleasant and cooperative with no c/o. Injection given in right upper outer quadrant. Pt to return in approaximately 4 weeks for next due injection.

## 2020-11-06 ENCOUNTER — Other Ambulatory Visit: Payer: Self-pay | Admitting: Gastroenterology

## 2020-11-06 ENCOUNTER — Other Ambulatory Visit (HOSPITAL_COMMUNITY): Payer: Self-pay | Admitting: Psychiatry

## 2020-11-06 DIAGNOSIS — F319 Bipolar disorder, unspecified: Secondary | ICD-10-CM

## 2020-11-10 ENCOUNTER — Telehealth: Payer: Self-pay | Admitting: Pharmacist

## 2020-11-10 NOTE — Chronic Care Management (AMB) (Signed)
I left the patient a message about his upcoming appointment on 11/11/2020 @ 12:00 pm  with the clinical pharmacist. He was asked to please have all medication on hand to review with the pharmacist.  [name]) [name], [place] Assistant [phone]

## 2020-11-11 ENCOUNTER — Ambulatory Visit (INDEPENDENT_AMBULATORY_CARE_PROVIDER_SITE_OTHER): Payer: Medicare HMO | Admitting: Pharmacist

## 2020-11-11 DIAGNOSIS — E7849 Other hyperlipidemia: Secondary | ICD-10-CM

## 2020-11-11 DIAGNOSIS — R69 Illness, unspecified: Secondary | ICD-10-CM | POA: Diagnosis not present

## 2020-11-11 DIAGNOSIS — I10 Essential (primary) hypertension: Secondary | ICD-10-CM

## 2020-11-11 DIAGNOSIS — F3131 Bipolar disorder, current episode depressed, mild: Secondary | ICD-10-CM

## 2020-11-11 NOTE — Progress Notes (Unsigned)
Chronic Care Management Pharmacy Note  11/12/2020 Name:  Sean Hill MRN:  [id number] DOB:  July 17, 1941  Subjective: Sean Hill is an 80 y.o. year old male who is a primary patient of [name], NP.  The CCM team was consulted for assistance with disease management and care coordination needs.    Engaged with patient by telephone for follow up visit in response to provider referral for pharmacy case management and/or care coordination services.   Consent to Services:  The patient was given information about Chronic Care Management services, agreed to services, and gave verbal consent prior to initiation of services.  Please see initial visit note for detailed documentation.   Patient Care Team: [name], NP as PCP - General (Family Medicine) Leanora Cover, MD as Consulting Physician (Orthopedic Surgery) Viona Gilmore, [facility] as Pharmacist (Pharmacist)  Recent office visits: 09/25/20 Sean Neas, LPN: Patient presented for medicare annual wellness visit.  06/19/20 [name], NP: Patient presented for video visit for HTN. Switched lisinopril to losartan daily due to cough.   Recent consult visits: 10/19/20 Sean Murray, LPN (behavioral health): Patient presented for aripiprazole injection.  10/01/20 Sean Hill (psychiatry): Unable to access notes.  09/23/20 Sean Andreas, MD (behavioral health): Patient presented for bipolar follow up.  07/24/20 Sean Hill (ophthalmology): Patient presented for macular degeneration follow up. Unable to access notes.  06/11/20 Sean Hill (ophthalmology): Patient presented for new eye exam. Unable to access notes.    Hospital visits: 08/07/20 Patient presented to the ED for pneumonia.   Objective:  Lab Results  Component Value Date   CREATININE 0.81 08/07/2020   BUN 11 08/07/2020   GFR 59.27 (L) 12/25/2019   GFRNONAA >60 08/07/2020   GFRAA >60 10/09/2019   NA 136 08/07/2020   K 4.4 08/07/2020    CALCIUM 9.0 08/07/2020   CO2 25 08/07/2020    Lab Results  Component Value Date/Time   HGBA1C 6.0 12/25/2019 08:23 AM   HGBA1C 6.1 11/07/2018 07:41 AM   GFR 59.27 (L) 12/25/2019 08:23 AM   GFR 61.23 11/07/2018 07:41 AM   MICROALBUR 1.5 11/28/2011 04:28 PM    Last diabetic Eye exam: No results found for: HMDIABEYEEXA  Last diabetic Foot exam: No results found for: HMDIABFOOTEX   Lab Results  Component Value Date   CHOL 140 12/25/2019   HDL 32.00 (L) 12/25/2019   LDLDIRECT 72.0 12/25/2019   TRIG 265.0 (H) 12/25/2019   CHOLHDL 4 12/25/2019    Hepatic Function Latest Ref Rng & Units 08/07/2020 12/25/2019 10/09/2019  Total Protein 6.5 - 8.1 g/dL 7.2 6.7 6.4(L)  Albumin 3.5 - 5.0 g/dL 3.9 4.5 3.9  AST 15 - 41 U/L _0 ALT 0 - 44 U/L _1 Alk Phosphatase 38 - 126 U/L 63 59 59  Total Bilirubin 0.3 - 1.2 mg/dL 1.0 0.5 0.6  Bilirubin, Direct 0.0 - 0.3 mg/dL - - -    Lab Results  Component Value Date/Time   TSH 2.30 12/25/2019 08:23 AM   TSH 1.69 11/07/2018 07:41 AM   FREET4 0.89 07/21/2011 06:52 PM    CBC Latest Ref Rng & Units 08/07/2020 12/25/2019 10/10/2019  WBC 4.0 - 10.5 K/uL 9.0 4.9 6.5  Hemoglobin 13.0 - 17.0 g/dL 14.3 14.9 12.0(L)  Hematocrit 39.0 - 52.0 % 41.7 44.6 35.6(L)  Platelets 150 - 400 K/uL 189 245.0 219    No results found for: VD25OH  Clinical ASCVD: No  The 10-year ASCVD risk score Mikey Bussing DC  Brooke Bonito., et al., 2013) is: 39.5%   Values used to calculate the score:     Age: 89 years     Sex: Male     Is Non-Hispanic African American: No     Diabetic: No     Tobacco smoker: No     Systolic Blood Pressure: 179 mmHg     Is BP treated: Yes     HDL Cholesterol: 32 mg/dL     Total Cholesterol: 140 mg/dL    Depression screen [facility] 2/9 09/25/2020 11/07/2018 09/21/2017  Decreased Interest 0 0 0  Down, Depressed, Hopeless 0 0 -  PHQ - 2 Score 0 0 0  Some recent data might be hidden      Social History   Tobacco Use  Smoking Status Never Smoker  Smokeless  Tobacco Never Used   BP Readings from Last 3 Encounters:  10/19/20 (!) 146/76  09/15/20 (!) 184/73  08/17/20 139/78   Pulse Readings from Last 3 Encounters:  10/19/20 75  09/15/20 97  08/17/20 75   Wt Readings from Last 3 Encounters:  10/19/20 264 lb 9.6 oz (120 kg)  09/23/20 260 lb (117.9 kg)  09/15/20 264 lb (119.7 kg)    Assessment/Interventions: Review of patient past medical history, allergies, medications, health status, including review of consultants reports, laboratory and other test data, was performed as part of comprehensive evaluation and provision of chronic care management services.   SDOH:  (Social Determinants of Health) assessments and interventions performed: No   CCM Care Plan  Allergies  Allergen Reactions  . Penicillins Other (See Comments)    BLISTERS Did it involve swelling of the face/tongue/throat, SOB, or low BP? No Did it involve sudden or severe rash/hives, skin peeling, or any reaction on the inside of your mouth or nose? No Did you need to seek medical attention at a hospital or doctor's office? No When did it last happen?around 1980 If all above answers are "NO", may proceed with cephalosporin use.   Marland Kitchen Lisinopril     Dry cough, abdominal bloating and diarrhea  . Codeine Rash    Medications Reviewed Today    Reviewed by Launa Grill, LPN ([employer]ensed [place] [place]) on 09/25/20 at 1453  Med List Status: <None>  Medication Order Taking? Sig Documenting Provider Last Dose Status Informant  albuterol (VENTOLIN HFA) 108 (90 Base) MCG/ACT inhaler [id number] No Inhale 2 puffs into the lungs every 6 (six) hours as needed for wheezing or shortness of breath.  Patient not taking: Reported on 09/25/2020   [name], NP Not Taking Active   ARIPiprazole ER (ABILIFY MAINTENA) 400 MG prefilled syringe 400 mg [id number]   Arfeen, Arlyce Harman, MD  Active   ARIPiprazole ER (ABILIFY MAINTENA) 400 MG PRSY prefilled syringe [id number] Yes Inject 400 mg into  the muscle every 28 (twenty-eight) days. [name], MD Taking Active   benztropine (COGENTIN) 1 MG tablet [id number] Yes Take 1 tablet (1 mg total) by mouth daily. [name], MD Taking Active   citalopram (CELEXA) 20 MG tablet [id number] Yes Take 1 tablet (20 mg total) by mouth daily. [name], MD Taking Active   doxycycline (VIBRAMYCIN) 100 MG capsule [id number] Yes Take 1 capsule (100 mg total) by mouth 2 (two) times daily. Delia Heady, PA-C Taking Active   ezetimibe (ZETIA) 10 MG tablet [id number] Yes Take 1 tablet (10 mg total) by mouth daily. Nafziger, Tommi Rumps, NP Taking Active   fluticasone [facility]) 50 MCG/ACT nasal spray [id number] Yes Place  1 spray into both nostrils in the morning and at bedtime. [provider] Taking Active   losartan (COZAAR) 50 MG tablet [id number] No Take 1 tablet (50 mg total) by mouth daily.  Patient not taking: Reported on 09/25/2020   [name], NP Not Taking Active   Multiple Vitamin (MULTIVITAMIN WITH MINERALS) TABS tablet [id number] Yes Take 1 tablet by mouth daily. [provider] Taking Active Self  Multiple Vitamins-Minerals (PRESERVISION AREDS 2 PO) [id number] Yes Take 1 capsule by mouth 2 (two) times daily. [provider] Taking Active Self  Omega-3 Fatty Acids (FISH OIL PO) [id number] Yes Take 1 capsule by mouth daily.  [provider] Taking Active Self  pantoprazole (PROTONIX) 40 MG tablet [id number] Yes Take 1 tablet (40 mg total) by mouth 2 (two) times daily. Cirigliano, Vito V, DO Taking Active Self  temazepam (RESTORIL) 30 MG capsule [id number] Yes Take 1 capsule (30 mg total) by mouth at bedtime. [name], MD Taking Active   traZODone (DESYREL) 50 MG tablet [id number] Yes Take 1 tablet (50 mg total) by mouth at bedtime. [name], MD Taking Active   Vitamin D, Cholecalciferol, 1000 UNITS TABS [id number] Yes Take 1,000 Units by mouth daily.  [provider] Taking Active Self           Patient Active Problem List   Diagnosis Date Noted  . Bipolar affective disorder, currently depressed, mild ([place]) 10/21/2019  . Hemorrhage of colon following colonoscopy 10/08/2019  . Adenomatous polyp of ascending colon   . Tubulovillous adenoma of colon   . History of colonic polyps   . Diverticulosis of colon without hemorrhage   . Grade I internal hemorrhoids   . Essential hypertension 08/23/2016  . Left bundle branch block (LBBB) on electrocardiogram 06/10/2016  . Hyperlipidemia 06/07/2016  . Left leg swelling 01/06/2015  . Change in bowel habits 01/06/2015  . Gastroesophageal reflux disease 11/28/2011  . Bipolar disorder ([place]) 11/25/2011    Immunization History  Administered Date(s) Administered  . Fluad Quad(high Dose 65+) 05/29/2019  . Influenza Inj Mdck Quad Pf 05/29/2019  . Influenza, High Dose Seasonal PF 06/07/2016, 05/15/2017  . Influenza-Unspecified 07/20/2013, 06/16/2020  . PFIZER(Purple Top)SARS-COV-2 Vaccination 10/25/2019, 11/19/2019, 06/16/2020  . Pneumococcal Conjugate-13 03/11/2015  . Pneumococcal Polysaccharide-23 07/06/2010  . Tdap 07/06/2010, 10/29/2015    Conditions to be addressed/monitored:  Hypertension, Hyperlipidemia, Diabetes, GERD, Allergic Rhinitis and insomnia, bipolar disorder  Care Plan : [place]  Updates made by Viona Gilmore, [place] since 11/12/2020 12:00 AM    Problem: CHL AMB "PATIENT-SPECIFIC PROBLEM"     Long-Range Goal: Patient-Specific Goal   Start Date: 11/11/2020  Expected End Date: 11/11/2021  This Visit's Progress: On track  Priority: High  Note:   Current Barriers:  . Unable to independently monitor therapeutic efficacy . Does not contact provider office for questions/concerns  Pharmacist Clinical Goal(s):  Marland Kitchen Over the next 120 days, patient will achieve adherence to monitoring guidelines and medication adherence to achieve therapeutic efficacy through collaboration with PharmD and provider.     Interventions: . 1:1 collaboration with [name], NP regarding development and update of comprehensive plan of care as evidenced by provider attestation and co-signature . Inter-disciplinary care team collaboration (see longitudinal plan of care) . Comprehensive medication review performed; medication list updated in electronic medical record  Hypertension (BP goal <140/90) -Uncontrolled -Current treatment: . Losartan 50 mg 1 tablet daily - not taking -Medications previously tried: lisinopril (cough) -Current home readings: 140s/70-80s  -  Current dietary habits:  tries to avoid using salt with foods -Current exercise habits:  patient does not do any structured exercise due to shoulder pain and double knee replacements; patient can walk and would like to start being more active -Denies hypotensive/hypertensive symptoms -Educated on BP goals and benefits of medications for prevention of heart attack, stroke and kidney damage; Exercise goal of 150 minutes per week; Importance of home blood pressure monitoring; -Counseled to monitor BP at home weekly, document, and provide log at future appointments -Recommended restart taking losartan 50 mg daily  Hyperlipidemia: (LDL goal < 100) -Controlled -Current treatment: . Ezetimibe  10 mg 1 tablet daily . Fish oil 1000 mg capsule daily -Medications previously tried: statins  -Current dietary patterns: tries to avoid fatty foods -Current exercise habits: patient does not do any structured exercise due to shoulder pain and double knee replacements; patient can walk and would like to start being more active -Educated on Cholesterol goals;  Importance of limiting foods high in cholesterol; Exercise goal of 150 minutes per week; -Recommended to continue current medication Recommended increasing fish oil to twice daily to reach effective dose of 2 g daily  Pre-diabetes (A1c goal <6.5%) -Controlled -Current medications: . No  medications -Medications previously tried: none  -Current home glucose readings . fasting glucose: none . post prandial glucose: none -Denies hypoglycemic/hyperglycemic symptoms -Current meal patterns:  . breakfast: did not discuss  . lunch: did not discuss  . dinner: did not discuss . snacks: did not discuss . drinks: did not discuss -Current exercise: patient does not do any structured exercise due to shoulder pain and double knee replacements; patient can walk and would like to start being more active -Educated onCarbohydrate counting and/or plate method -Counseled to check feet daily and get yearly eye exams -Counseled on diet and exercise extensively Provided healthy plate handout  GERD (Goal: minimize symptoms of heartburn/acid reflux) -Controlled -Current treatment  . Pantoprazole 28m, 1 tablet twice daily -Medications previously tried: none  -Recommended to continue current medication  Bipolar disorder (Goal: minimize symptoms) -Controlled -Current treatment   Aripiprazole ER 403m inject 40052mvery twenty-eight days   Citalopram 35m41m tablet once daily -Medications previously tried: unknown  -Recommended to continue current medication  Sinus congestion (Goal: minimize symptoms of congestion) -Uncontrolled -Current treatment   Fluticasone nasal spray, 1 spray into both nostrils in the morning and at bedtime    Chlorpheniramine 4 mg daily -Medications previously tried: Zyrtec (stopped for unknown reason)  -Recommended to continue current medication Counseled on side effects with chlorpheniramine and avoiding in patients over age 49 R21ommended switching chlorpheniramine to Zyrtec as patient has had success with this before  Insomnia (Goal: improve quality and quantity of sleep) -Controlled -Current treatment   Temazepam 30mg27mcapsule at bedtime   Trazodone 50 mg 1 tablet daily -Medications previously tried: several medications  -Recommended to  continue current medication Counseled on practicing good sleep hygiene by setting a sleep schedule and maintaining it, avoid excessive napping, following a nightly routine, avoiding screen time for 30-60 minutes before going to bed, and making the bedroom a cool, quiet and dark space   Health Maintenance -Vaccine gaps: shingles -Current therapy:   Multivitamin, 1 tablet once daily  Preservision AREDS 2, 1 capsule twice daily  Vitamin D3, 1,000 units once daily  -Educated on Cost vs benefit of each product must be carefully weighed by individual consumer -Patient is satisfied with current therapy and denies issues -Recommended to continue current medication  Patient Goals/Self-Care Activities . Over the next 120 days, patient will:  - take medications as prescribed focus on medication adherence by adding losartan to packaging  Follow Up Plan: Telephone follow up appointment with care management team member scheduled for: 4 months       Medication Assistance: None required.  Patient affirms current coverage meets needs.  Patient's preferred pharmacy is:  [facility] - [place], [place] - [address] [place] [address] Phone: [phone] Fax: [phone]  [facility] [place] [place] [address] Phone: [phone] Fax: [phone]  Uses pill box? No - adherence packaging Pt endorses 100% compliance  We discussed: Benefits of medication synchronization, packaging and delivery as well as enhanced pharmacist oversight with [facility]. Patient decided to: Utilize [facility] pharmacy for medication synchronization, packaging and delivery  Care Plan and Follow Up Patient Decision:  Patient agrees to Care Plan and Follow-up.  Plan: Telephone follow up appointment with care management team member scheduled for:  4 months  Jeni Salles, PharmD [place]  Pharmacist [place] [facility] [place] [phone]

## 2020-11-12 ENCOUNTER — Other Ambulatory Visit: Payer: Self-pay | Admitting: Adult Health

## 2020-11-12 DIAGNOSIS — R69 Illness, unspecified: Secondary | ICD-10-CM | POA: Diagnosis not present

## 2020-11-12 DIAGNOSIS — I10 Essential (primary) hypertension: Secondary | ICD-10-CM

## 2020-11-12 MED ORDER — LOSARTAN POTASSIUM 50 MG PO TABS: 50.0000 mg | ORAL_TABLET | Freq: Every day | ORAL | 1 refills | Status: DC

## 2020-11-12 NOTE — Patient Instructions (Addendum)
Hi Ronalee Belts,  It was great getting to speak with you yesterday! Below is a summary of some of the topics we discussed. As a reminder, you will be starting back on the losartan every day and make sure to check your blood pressure at least every week to make sure it is staying well controlled. We also discussed increasing your fish oil to twice daily as I added it to the packets and if it becomes too fishy, you can always stick those in the freezer (but not with your other medicines).   Don't forget to look into getting your shingles vaccine as well.  Please give me a call if you have any questions or need anything before our follow up!  Best, Maddie  Jeni Salles, PharmD [facility] Clinical Pharmacist [place] [facility] [place]    Visit Information  Goals Addressed   None    Patient Care Plan: CCM Pharmacy Care Plan    Problem Identified: Problem: Hypertension, Hyperlipidemia, Diabetes, GERD, Allergic Rhinitis and insomnia, bipolar disorder     Long-Range Goal: Patient-Specific Goal   Start Date: 11/11/2020  Expected End Date: 11/11/2021  This Visit's Progress: On track  Priority: High  Note:   Current Barriers:  . Unable to independently monitor therapeutic efficacy . Does not contact provider office for questions/concerns  Pharmacist Clinical Goal(s):  Marland Kitchen Over the next 120 days, patient will achieve adherence to monitoring guidelines and medication adherence to achieve therapeutic efficacy through collaboration with PharmD and provider.    Interventions: . 1:1 collaboration with [name], NP regarding development and update of comprehensive plan of care as evidenced by provider attestation and co-signature . Inter-disciplinary care team collaboration (see longitudinal plan of care) . Comprehensive medication review performed; medication list updated in electronic medical record  Hypertension (BP goal <140/90) -Uncontrolled -Current  treatment: . Losartan 50 mg 1 tablet daily - not taking -Medications previously tried: lisinopril (cough) -Current home readings: 140s/70-80s  -Current dietary habits:  tries to avoid using salt with foods -Current exercise habits:  patient does not do any structured exercise due to shoulder pain and double knee replacements; patient can walk and would like to start being more active -Denies hypotensive/hypertensive symptoms -Educated on BP goals and benefits of medications for prevention of heart attack, stroke and kidney damage; Exercise goal of 150 minutes per week; Importance of home blood pressure monitoring; -Counseled to monitor BP at home weekly, document, and provide log at future appointments -Recommended restart taking losartan 50 mg daily  Hyperlipidemia: (LDL goal < 100) -Controlled -Current treatment: . Ezetimibe  10 mg 1 tablet daily . Fish oil 1000 mg capsule daily -Medications previously tried: statins  -Current dietary patterns: tries to avoid fatty foods -Current exercise habits: patient does not do any structured exercise due to shoulder pain and double knee replacements; patient can walk and would like to start being more active -Educated on Cholesterol goals;  Importance of limiting foods high in cholesterol; Exercise goal of 150 minutes per week; -Recommended to continue current medication Recommended increasing fish oil to twice daily to reach effective dose of 2 g daily  Pre-diabetes (A1c goal <6.5%) -Controlled -Current medications: . No medications -Medications previously tried: none  -Current home glucose readings . fasting glucose: none . post prandial glucose: none -Denies hypoglycemic/hyperglycemic symptoms -Current meal patterns:  . breakfast: did not discuss  . lunch: did not discuss  . dinner: did not discuss . snacks: did not discuss . drinks: did not discuss -Current exercise: patient  does not do any structured exercise due to shoulder  pain and double knee replacements; patient can walk and would like to start being more active -Educated onCarbohydrate counting and/or plate method -Counseled to check feet daily and get yearly eye exams -Counseled on diet and exercise extensively Provided healthy plate handout  GERD (Goal: minimize symptoms of heartburn/acid reflux) -Controlled -Current treatment  . Pantoprazole 40mg , 1 tablet twice daily -Medications previously tried: none  -Recommended to continue current medication  Bipolar disorder (Goal: minimize symptoms) -Controlled -Current treatment   Aripiprazole ER 400mg , inject 400mg  every twenty-eight days   Citalopram 20mg , 1 tablet once daily -Medications previously tried: unknown  -Recommended to continue current medication  Sinus congestion (Goal: minimize symptoms of congestion) -Uncontrolled -Current treatment   Fluticasone nasal spray, 1 spray into both nostrils in the morning and at bedtime    Chlorpheniramine 4 mg daily -Medications previously tried: Zyrtec (stopped for unknown reason)  -Recommended to continue current medication Counseled on side effects with chlorpheniramine and avoiding in patients over age 22 Recommended switching chlorpheniramine to Zyrtec as patient has had success with this before  Insomnia (Goal: improve quality and quantity of sleep) -Controlled -Current treatment   Temazepam 30mg , 1 capsule at bedtime   Trazodone 50 mg 1 tablet daily -Medications previously tried: several medications  -Recommended to continue current medication Counseled on practicing good sleep hygiene by setting a sleep schedule and maintaining it, avoid excessive napping, following a nightly routine, avoiding screen time for 30-60 minutes before going to bed, and making the bedroom a cool, quiet and dark space   Health Maintenance -Vaccine gaps: shingles -Current therapy:   Multivitamin, 1 tablet once daily  Preservision AREDS 2, 1 capsule twice  daily  Vitamin D3, 1,000 units once daily  -Educated on Cost vs benefit of each product must be carefully weighed by individual consumer -Patient is satisfied with current therapy and denies issues -Recommended to continue current medication   Patient Goals/Self-Care Activities . Over the next 120 days, patient will:  - take medications as prescribed focus on medication adherence by adding losartan to packaging  Follow Up Plan: Telephone follow up appointment with care management team member scheduled for: 4 months       Patient verbalizes understanding of instructions provided today and agrees to view in [place].  Telephone follow up appointment with pharmacy team member scheduled for:4 months  Viona Gilmore, [facility]

## 2020-11-16 ENCOUNTER — Other Ambulatory Visit (HOSPITAL_COMMUNITY): Payer: Self-pay | Admitting: *Deleted

## 2020-11-16 ENCOUNTER — Other Ambulatory Visit: Payer: Self-pay

## 2020-11-16 ENCOUNTER — Encounter (HOSPITAL_COMMUNITY): Payer: Self-pay | Admitting: *Deleted

## 2020-11-16 ENCOUNTER — Ambulatory Visit (INDEPENDENT_AMBULATORY_CARE_PROVIDER_SITE_OTHER): Payer: Medicare HMO | Admitting: *Deleted

## 2020-11-16 ENCOUNTER — Telehealth (HOSPITAL_COMMUNITY): Payer: Self-pay | Admitting: *Deleted

## 2020-11-16 VITALS — BP 170/76 | HR 86 | Resp 20 | Ht 64.0 in | Wt 262.6 lb

## 2020-11-16 DIAGNOSIS — F319 Bipolar disorder, unspecified: Secondary | ICD-10-CM

## 2020-11-16 DIAGNOSIS — R69 Illness, unspecified: Secondary | ICD-10-CM | POA: Diagnosis not present

## 2020-11-16 DIAGNOSIS — F411 Generalized anxiety disorder: Secondary | ICD-10-CM

## 2020-11-16 MED ORDER — CITALOPRAM HYDROBROMIDE 10 MG PO TABS: 10.0000 mg | ORAL_TABLET | Freq: Every day | ORAL | 1 refills | Status: DC

## 2020-11-16 NOTE — Telephone Encounter (Signed)
He can try Celexa 30 mg and if he agree then please call the new prescription.  If symptoms do not improve then he can call us back.

## 2020-11-16 NOTE — Telephone Encounter (Addendum)
Pt in for injection today and verbalizes c/o increased anxiety this past month. Pt appeared fidgety and anxious and states he hasn't felt this way since being on Maintena. Pt next appointment 12/23/2020.  I forgot to mention that pt wants to go on medication for wt. Loss. Sounds like probably Phentermine that another provider had mentioned to him. Writer advised that would have to come form weight loss clinic and that it is a stimulant and may increase anxiety. Pt weight today 262.6 lb. Please review and advise.

## 2020-11-16 NOTE — Progress Notes (Signed)
Pt in clinic today for Ruma 400mg  injection. Pt pleasant and cooperative on approach. Pt fidgety, c/o increased anxiety this past month; will relay to Dr. Adele Schilder. Injection prepared and given in left upper outer quadrant w/o c/o. Pt to return in approximately 4 weeks for next due injection.

## 2020-11-18 ENCOUNTER — Telehealth (HOSPITAL_COMMUNITY): Payer: Self-pay | Admitting: *Deleted

## 2020-11-18 ENCOUNTER — Other Ambulatory Visit: Payer: Self-pay | Admitting: General Surgery

## 2020-11-18 ENCOUNTER — Encounter: Payer: Self-pay | Admitting: Pharmacist

## 2020-11-18 NOTE — Telephone Encounter (Signed)
PA requested for Cogentin on cover my meds.

## 2020-11-18 NOTE — Progress Notes (Signed)
Error

## 2020-11-18 NOTE — Progress Notes (Signed)
Need to contact the patient per Dr Bryan Lemma to verify if the patient has titrated down on his pantoprazole before a new rx is sent in

## 2020-11-18 NOTE — Telephone Encounter (Signed)
[facility] calling for Protonix refill.

## 2020-11-18 NOTE — Telephone Encounter (Signed)
PA for Cogentin approved.  Dates 09/19/20 thru 09/18/21.  Auth# [id number]

## 2020-11-19 ENCOUNTER — Encounter: Payer: Self-pay | Admitting: Adult Health

## 2020-11-19 ENCOUNTER — Telehealth (INDEPENDENT_AMBULATORY_CARE_PROVIDER_SITE_OTHER): Payer: Medicare HMO | Admitting: Adult Health

## 2020-11-19 ENCOUNTER — Other Ambulatory Visit: Payer: Self-pay

## 2020-11-19 VITALS — BP 170/76 | Wt 260.0 lb

## 2020-11-19 DIAGNOSIS — J0140 Acute pansinusitis, unspecified: Secondary | ICD-10-CM | POA: Diagnosis not present

## 2020-11-19 MED ORDER — PANTOPRAZOLE SODIUM 40 MG PO TBEC: 40.0000 mg | DELAYED_RELEASE_TABLET | Freq: Two times a day (BID) | ORAL | 1 refills | Status: DC

## 2020-11-19 MED ORDER — DOXYCYCLINE HYCLATE 100 MG PO CAPS: 100.0000 mg | ORAL_CAPSULE | Freq: Two times a day (BID) | ORAL | 0 refills | Status: DC

## 2020-11-19 NOTE — Progress Notes (Signed)
Patient contacted the clinic this morning and spoke with [facility] stated he was taking 2 pantoprazole daily. Patient is currently taking 2 pantoprazole daily.

## 2020-11-19 NOTE — Progress Notes (Signed)
Virtual Visit via Telephone Note  I connected with Sean Hill on 11/19/20 at 11:00 AM EST by telephone and verified that I am speaking with the correct person using two identifiers.   I discussed the limitations, risks, security and privacy concerns of performing an evaluation and management service by telephone and the availability of in person appointments. I also discussed with the patient that there may be a patient responsible charge related to this service. The patient expressed understanding and agreed to proceed.  Location patient: home Location provider: work or home office Participants present for the call: patient, provider Patient did not have a visit in the prior 7 days to address this/these issue(s).   History of Present Illness: He is being evaluated today for an acute issue.  Symptoms started roughly 2 to 3 weeks ago.  Symptoms include sinus congestion and sinus pain and pressure as well as fatigue.  He denies fevers or chills but does feel acutely ill.  At home he has tried Flonase which has not helped relieve his symptoms.  It was noted that his blood pressure was elevated today, he reports that he is waiting [facility] and should be receiving this today or tomorrow.  His blood pressure medication is included on this delivery   Observations/Objective: Patient sounds cheerful and well on the phone. I do not appreciate any SOB. Speech and thought processing are grossly intact. Patient reported vitals:  Assessment and Plan: 1. Acute non-recurrent pansinusitis - doxycycline (VIBRAMYCIN) 100 MG capsule; Take 1 capsule (100 mg total) by mouth 2 (two) times daily.  Dispense: 14 capsule; Refill: 0 - Follow up if not starting to resolve in the next 2-3 days   Follow Up Instructions:   I did not refer this patient for an OV in the next 24 hours for this/these issue(s).  I discussed the assessment and treatment plan with the patient. The  patient was provided an opportunity to ask questions and all were answered. The patient agreed with the plan and demonstrated an understanding of the instructions.   The patient was advised to call back or seek an in-person evaluation if the symptoms worsen or if the condition fails to improve as anticipated.  I provided 13 minutes of non-face-to-face time during this encounter.   [name], NP

## 2020-11-19 NOTE — Addendum Note (Signed)
Addended by: Lanny Hurst A on: 11/19/2020 11:25 AM   Modules accepted: Orders

## 2020-11-19 NOTE — Progress Notes (Signed)
Per Dr Bryan Lemma pantoprazole 40mg  bid follow up in clinic

## 2020-11-20 ENCOUNTER — Telehealth (HOSPITAL_COMMUNITY): Payer: Self-pay

## 2020-11-20 NOTE — Telephone Encounter (Signed)
[employer] MEDICARE PRESCRIPTION COVERAGE APPROVED  BENZTROPINE MESYLATE 1 MG TABLET EFFECTIVE 09/19/2020 TO 09/18/2021

## 2020-12-02 DIAGNOSIS — H353222 Exudative age-related macular degeneration, left eye, with inactive choroidal neovascularization: Secondary | ICD-10-CM | POA: Diagnosis not present

## 2020-12-02 DIAGNOSIS — H353211 Exudative age-related macular degeneration, right eye, with active choroidal neovascularization: Secondary | ICD-10-CM | POA: Diagnosis not present

## 2020-12-07 ENCOUNTER — Telehealth: Payer: Self-pay | Admitting: Pharmacist

## 2020-12-07 NOTE — Chronic Care Management (AMB) (Addendum)
Chronic Care Management Pharmacy Assistant   Name: Sean Hill  MRN: [id number] DOB: 05-Jul-1941  Reason for Encounter: Medication Review   Recent office visits:  None  Recent consult visits:  . 02.24.2022 Tresa Res, LCSW Psychiatry o Citalopram 10 mg added  Hospital visits:  None in previous 6 months  Medications: Outpatient Encounter Medications as of 12/07/2020  Medication Sig  . albuterol (VENTOLIN HFA) 108 (90 Base) MCG/ACT inhaler Inhale 2 puffs into the lungs every 6 (six) hours as needed for wheezing or shortness of breath.  . ARIPiprazole ER (ABILIFY MAINTENA) 400 MG PRSY prefilled syringe Inject 400 mg into the muscle every 28 (twenty-eight) days.  . benztropine (COGENTIN) 1 MG tablet Take 1 tablet (1 mg total) by mouth daily.  . citalopram (CELEXA) 10 MG tablet Take 1 tablet (10 mg total) by mouth daily. To be taken with Celexa 20mg  for a total dose of 30 mg daily.  . citalopram (CELEXA) 20 MG tablet Take 1 tablet (20 mg total) by mouth daily.  Marland Kitchen doxycycline (VIBRAMYCIN) 100 MG capsule Take 1 capsule (100 mg total) by mouth 2 (two) times daily.  Marland Kitchen ezetimibe (ZETIA) 10 MG tablet Take 1 tablet (10 mg total) by mouth daily.  Marland Kitchen losartan (COZAAR) 50 MG tablet Take 1 tablet (50 mg total) by mouth daily.  . Multiple Vitamin (MULTIVITAMIN WITH MINERALS) TABS tablet Take 1 tablet by mouth daily.  . Multiple Vitamins-Minerals (PRESERVISION AREDS 2 PO) Take 1 capsule by mouth 2 (two) times daily.  . Omega-3 Fatty Acids (FISH OIL PO) Take 1 capsule by mouth in the morning and at bedtime.  Marland Kitchen oxymetazoline (AFRIN) 0.05 % nasal spray Place 1 spray into both nostrils 2 (two) times daily.  . pantoprazole (PROTONIX) 40 MG tablet Take 1 tablet (40 mg total) by mouth 2 (two) times daily.  . temazepam (RESTORIL) 30 MG capsule Take 1 capsule (30 mg total) by mouth at bedtime.  . traZODone (DESYREL) 50 MG tablet Take 1 tablet (50 mg total) by mouth at bedtime.  . Vitamin D,  Cholecalciferol, 1000 UNITS TABS Take 1,000 Units by mouth daily.    Facility-Administered Encounter Medications as of 12/07/2020  Medication  . ARIPiprazole ER (ABILIFY MAINTENA) 400 MG prefilled syringe 400 mg   Reviewed chart for medication changes ahead of medication coordination call.  BP Readings from Last 3 Encounters:  11/19/20 (!) 170/76  11/16/20 (!) 170/76  10/19/20 (!) 146/76    Lab Results  Component Value Date   HGBA1C 6.0 12/25/2019    Patient obtains medications through Adherence Packaging  90 Days  Last adherence delivery included:  Marland Kitchen Citalopram (CELEXA) 20 mg: one tablet at breakfast . Trazodone (DESYREL) 50 mg: one capsule at bedtime . Benztropine (COGENTIN) 1 mg: one tablet at lunch . Ezetimibe (ZETIA) 10 mg: one tablet at lunch . Losartan (COZAAR) 50 MG tablet: one tablet at breakfast . Temazepam (RESTORIL) 30 mg: one capsule at bedtime . Chlorpheniramine 4 mg: one tablet at breakfast . Citalopram (CELEXA) 10 mg: one tablet at breakfast . Omega-3 Fatty Acids 1,000 Mg Capsule: One at breakfast and one bedtime . Preservision Areds-2gt: one at breakfast . Men's 50 plus multivitamin: one tablet at breakfast . Vitamin D3 25 Mcg: one tablet at breakfast . Cetirizine 10 mg: one tablet at breakfast  Patient declined the following medications due to PRN in use.  . Albuterol Aer Hfa (PRN) . Tobramycin 0.3% eyedrops (PRN)  I spoke with the patient and review  medications. There are no changes in medications currently. Patient declined these medication this month due to PRN use/additional supply on hand.The patient is taking the following medications: 90 days supply on 02.28.2022 . Citalopram (CELEXA) 20 mg: one tablet at breakfast . Trazodone (DESYREL) 50 mg: one capsule at bedtime . Benztropine (COGENTIN) 1 mg: one tablet at lunch . Ezetimibe (ZETIA) 10 mg: one tablet at lunch . Losartan (COZAAR) 50 MG tablet: one tablet at breakfast . Temazepam (RESTORIL) 30 mg:  one capsule at bedtime . Chlorpheniramine 4 mg: one tablet at breakfast . Citalopram (CELEXA) 10 mg: one tablet at breakfast . Omega-3 Fatty Acids 1,000 Mg Capsule: One at breakfast and one bedtime . Preservision Areds-2gt: one at breakfast . Men's 50 plus multivitamin: one tablet at breakfast . Vitamin D3 25 Mcg: one tablet at breakfast . Cetirizine 10 mg: one tablet at breakfast . Pantoprazole (PROTONIX) 40 mg: one tablet at breakfast and at dinner  . Albuterol Aer Hfa (PRN) . Tobramycin 0.3% eyedrops (PRN)  Coordinated acute fill for the following medication to be delivered 12/17/2020 . Citalopram (CELEXA) 10 mg: one tablet at breakfast  He does currently need refills.  Star Rating Drugs:  Dispensed Quantity Pharmacy  Losartan 50 mg 02.28.2022 [facility]    Amilia Revonda Standard, [place] Assistant [phone]

## 2020-12-16 ENCOUNTER — Ambulatory Visit (INDEPENDENT_AMBULATORY_CARE_PROVIDER_SITE_OTHER): Payer: Medicare HMO | Admitting: *Deleted

## 2020-12-16 ENCOUNTER — Other Ambulatory Visit: Payer: Self-pay

## 2020-12-16 VITALS — BP 127/80 | HR 69 | Resp 20 | Wt 262.6 lb

## 2020-12-16 DIAGNOSIS — F319 Bipolar disorder, unspecified: Secondary | ICD-10-CM

## 2020-12-16 DIAGNOSIS — R69 Illness, unspecified: Secondary | ICD-10-CM | POA: Diagnosis not present

## 2020-12-16 DIAGNOSIS — M25511 Pain in right shoulder: Secondary | ICD-10-CM | POA: Diagnosis not present

## 2020-12-16 NOTE — Progress Notes (Signed)
Pt presents today for due injection of Abilify Maintena 400 mg. Injection prepared and given in right upper outer quadrant without complaint. Pt says he feels lees anxious with medication increase. Sean Hill is to return in approximately 4 weeks for next due injection.

## 2020-12-23 ENCOUNTER — Telehealth (INDEPENDENT_AMBULATORY_CARE_PROVIDER_SITE_OTHER): Payer: Medicare HMO | Admitting: Psychiatry

## 2020-12-23 ENCOUNTER — Other Ambulatory Visit: Payer: Self-pay

## 2020-12-23 ENCOUNTER — Encounter (HOSPITAL_COMMUNITY): Payer: Self-pay | Admitting: Psychiatry

## 2020-12-23 VITALS — Wt 262.0 lb

## 2020-12-23 DIAGNOSIS — R69 Illness, unspecified: Secondary | ICD-10-CM | POA: Diagnosis not present

## 2020-12-23 DIAGNOSIS — F411 Generalized anxiety disorder: Secondary | ICD-10-CM

## 2020-12-23 DIAGNOSIS — F319 Bipolar disorder, unspecified: Secondary | ICD-10-CM

## 2020-12-23 MED ORDER — TRAZODONE HCL 50 MG PO TABS: 50.0000 mg | ORAL_TABLET | Freq: Every day | ORAL | 0 refills | Status: DC

## 2020-12-23 MED ORDER — TEMAZEPAM 30 MG PO CAPS: 30.0000 mg | ORAL_CAPSULE | Freq: Every day | ORAL | 0 refills | Status: DC

## 2020-12-23 MED ORDER — CITALOPRAM HYDROBROMIDE 10 MG PO TABS: 30.0000 mg | ORAL_TABLET | Freq: Every day | ORAL | 0 refills | Status: DC

## 2020-12-23 MED ORDER — BENZTROPINE MESYLATE 1 MG PO TABS: 1.0000 mg | ORAL_TABLET | Freq: Every day | ORAL | 0 refills | Status: DC

## 2020-12-23 MED ORDER — ARIPIPRAZOLE ER 400 MG IM PRSY: 400.0000 mg | PREFILLED_SYRINGE | INTRAMUSCULAR | 2 refills | Status: DC
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
## 2020-12-29 ENCOUNTER — Encounter: Payer: Self-pay | Admitting: Adult Health

## 2020-12-29 ENCOUNTER — Other Ambulatory Visit: Payer: Self-pay

## 2020-12-29 ENCOUNTER — Telehealth (INDEPENDENT_AMBULATORY_CARE_PROVIDER_SITE_OTHER): Payer: Medicare HMO | Admitting: Adult Health

## 2020-12-29 VITALS — Temp 99.0°F

## 2020-12-29 DIAGNOSIS — R059 Cough, unspecified: Secondary | ICD-10-CM

## 2020-12-29 DIAGNOSIS — J069 Acute upper respiratory infection, unspecified: Secondary | ICD-10-CM

## 2020-12-29 MED ORDER — AZITHROMYCIN 250 MG PO TABS: ORAL_TABLET | ORAL | 0 refills | Status: DC

## 2020-12-29 MED ORDER — ALBUTEROL SULFATE HFA 108 (90 BASE) MCG/ACT IN AERS: 2.0000 | INHALATION_SPRAY | Freq: Four times a day (QID) | RESPIRATORY_TRACT | 3 refills | Status: DC | PRN

## 2020-12-29 NOTE — Progress Notes (Signed)
Virtual Visit via Telephone Note  I connected with Sean Hill on 12/29/20 at 10:30 AM EDT by telephone and verified that I am speaking with the correct person using two identifiers.   I discussed the limitations, risks, security and privacy concerns of performing an evaluation and management service by telephone and the availability of in person appointments. I also discussed with the patient that there may be a patient responsible charge related to this service. The patient expressed understanding and agreed to proceed.  Location patient: home Location provider: work or home office Participants present for the call: patient, provider Patient did not have a visit in the prior 7 days to address this/these issue(s).   History of Present Illness: 80 year old male who is being evaluated today for an acute issue.  His symptoms started 4 days ago.  Symptoms include productive cough, low-grade fever up to 99, shortness of breath while coughing and the feeling of "my chest rattles when I take a deep breath or cough".  He did take a home Covid test which was negative.  He is fully vaccinated.  Pulse ox at home has been 97%.  He has only tried Aleve which has not helped much with his symptoms.   Observations/Objective: Patient sounds ill on the phone  - Loose rattling cough with mild SOB on the phone  Speech and thought processing are grossly intact. Patient reported vitals:  Assessment and Plan:  1. Upper respiratory tract infection, unspecified type - Concern for recurrent PNA vs URI -We will treat with azithromycin Z-Pak and refill his albuterol inhaler.  Was on doxycycline roughly 1 month ago for sinus infection.  He is allergic to penicillin based antibiotics. - azithromycin (ZITHROMAX Z-PAK) 250 MG tablet; Take 2 tablets on Day 1.  Then take 1 tablet daily.  Dispense: 6 tablet; Refill: 0 - albuterol (VENTOLIN HFA) 108 (90 Base) MCG/ACT inhaler; Inhale 2 puffs into the lungs every 6  (six) hours as needed for wheezing or shortness of breath.  Dispense: 6.7 g; Refill: 3 -Advise follow-up in 2 days if no improvement in his symptoms, follow-up sooner if symptoms worsen  Follow Up Instructions:  I did not refer this patient for an OV in the next 24 hours for this/these issue(s).  I discussed the assessment and treatment plan with the patient. The patient was provided an opportunity to ask questions and all were answered. The patient agreed with the plan and demonstrated an understanding of the instructions.   The patient was advised to call back or seek an in-person evaluation if the symptoms worsen or if the condition fails to improve as anticipated.  I provided 13 minutes of non-face-to-face time during this encounter.   [name], NP

## 2020-12-31 ENCOUNTER — Telehealth: Payer: Self-pay | Admitting: Adult Health

## 2020-12-31 MED ORDER — PREDNISONE 10 MG PO TABS: ORAL_TABLET | ORAL | 0 refills | Status: DC

## 2020-12-31 NOTE — Telephone Encounter (Signed)
Patient wife is calling and stated that he was seen a couple days ago and is still not feeling any better. Per wife patient still has a bad cough and his chest sounds like it is rattling. Wife is asking for a call back. CB [phone]

## 2020-12-31 NOTE — Telephone Encounter (Signed)
The patient and he is not feeling much better despite antibiotics and albuterol.  Still feels a rattling sensation in his chest with some mild productive cough.  He has not had any increase in fever.  Send in prednisone taper.  If no improvement by early next week he needs to follow-up in the office.  If symptoms worsen over the weekend then go to urgent care

## 2021-01-04 ENCOUNTER — Other Ambulatory Visit: Payer: Self-pay

## 2021-01-05 ENCOUNTER — Ambulatory Visit (INDEPENDENT_AMBULATORY_CARE_PROVIDER_SITE_OTHER): Payer: Medicare HMO | Admitting: Adult Health

## 2021-01-05 ENCOUNTER — Ambulatory Visit (INDEPENDENT_AMBULATORY_CARE_PROVIDER_SITE_OTHER): Payer: Medicare HMO

## 2021-01-05 ENCOUNTER — Other Ambulatory Visit: Payer: Self-pay

## 2021-01-05 ENCOUNTER — Telehealth: Payer: Self-pay | Admitting: Adult Health

## 2021-01-05 ENCOUNTER — Encounter: Payer: Self-pay | Admitting: Adult Health

## 2021-01-05 VITALS — BP 128/72 | HR 67 | Temp 98.3°F | Ht 64.0 in | Wt 257.8 lb

## 2021-01-05 DIAGNOSIS — J4541 Moderate persistent asthma with (acute) exacerbation: Secondary | ICD-10-CM

## 2021-01-05 DIAGNOSIS — R059 Cough, unspecified: Secondary | ICD-10-CM

## 2021-01-05 DIAGNOSIS — J849 Interstitial pulmonary disease, unspecified: Secondary | ICD-10-CM

## 2021-01-05 MED ORDER — HYDROCODONE-HOMATROPINE 5-1.5 MG/5ML PO SYRP: 5.0000 mL | ORAL_SOLUTION | Freq: Three times a day (TID) | ORAL | 0 refills | Status: DC | PRN

## 2021-01-05 MED ORDER — GUAIFENESIN ER 600 MG PO TB12: 600.0000 mg | ORAL_TABLET | Freq: Two times a day (BID) | ORAL | 0 refills | Status: AC

## 2021-01-05 NOTE — Progress Notes (Signed)
   Subjective:    Patient ID: Sean Hill, male    DOB: October 29, 1940, 80 y.o.   MRN: [id number]  HPI    Review of Systems     Objective:   Physical Exam        Assessment & Plan:

## 2021-01-05 NOTE — Telephone Encounter (Signed)
Spoke to patient and informed of chest xray which did not show any acute abnormalities   2. Unchanged mild interstitial prominence. Consider high-resolution chest CT as clinically indicated.  He is ok with having CT chest done

## 2021-01-05 NOTE — Progress Notes (Signed)
Subjective:    Patient ID: Sean Hill, male    DOB: June 19, 1941, 80 y.o.   MRN: [id number]  HPI  80 year old male who  has a past medical history of Anal fissure, Anxiety, Arthritis, Asthma, Bipolar disorder ([place]), Cataract, Depression, GERD (gastroesophageal reflux disease), Hemorrhage of colon following colonoscopy (10/08/2019), History of kidney stones, History of MRSA infection, Hyperlipidemia, Hypertension, Impaired hearing, Left bundle branch block (LBBB) on electrocardiogram (10/29/2015), Macular degeneration (senile) of retina, and Wears glasses.  He presents to the office today for follow-up regarding productive cough and shortness of breath.  Symptoms have been present for 2 weeks.  Was initially evaluated via virtual visit and prescribed prednisone ( still taking)  and Z-Pak.  Reports that this has not helped much.  Has been using his albuterol inhaler at home and reports that this helps for about 1 hour at a time.  Feels as though when he coughs he has a rattling in his chest.  Denies recent fevers or chills.  COVID test was negative  Review of Systems See HPI   Past Medical History:  Diagnosis Date  . Anal fissure   . Anxiety   . Arthritis    right wrist; pt. states "everywhere"  . Asthma   . Bipolar disorder ([place])   . Cataract   . Depression   . GERD (gastroesophageal reflux disease)   . Hemorrhage of colon following colonoscopy 10/08/2019  . History of kidney stones   . 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 Hyperlipidemia   . Hypertension    states under control with med., has been on med. x 1 yr.  . Impaired hearing   . Left bundle branch block (LBBB) on electrocardiogram 10/29/2015  . Macular degeneration (senile) of retina    left  . Wears glasses     Social History   Socioeconomic History  . Marital status: Married    Spouse name: Not on file  . Number of children: Not on file  . Years of education: Not on file  . Highest  education level: Not on file  Occupational History  . Not on file  Tobacco Use  . Smoking status: Never Smoker  . Smokeless tobacco: Never Used  Vaping Use  . Vaping Use: Never used  Substance and Sexual Activity  . Alcohol use: Not Currently  . Drug use: No  . Sexual activity: Not Currently    Birth control/protection: None  Other Topics Concern  . Not on file  Social History Narrative   Married, lives in [place].  Retired Dance movement psychotherapist.   Two daughters, both married,3 grandchildren ( 105, 30, 1 year)    No regular exercise.  Never smoker.  No ETOH.  No drugs.   Social Determinants of Health   Financial Resource Strain: Low Risk   . Difficulty of Paying Living Expenses: Not hard at all  Food Insecurity: No Food Insecurity  . Worried About Charity fundraiser in the Last Year: Never true  . Ran Out of Food in the Last Year: Never true  Transportation Needs: No Transportation Needs  . Lack of Transportation (Medical): No  . Lack of Transportation (Non-Medical): No  Physical Activity: Inactive  . Days of Exercise per Week: 0 days  . Minutes of Exercise per Session: 0 min  Stress: No Stress Concern Present  . Feeling of Stress : Not at all  Social Connections: Moderately Isolated  . Frequency of Communication  with Friends and Family: Once a week  . Frequency of Social Gatherings with Friends and Family: More than three times a week  . Attends Religious Services: Never  . Active Member of Clubs or Organizations: No  . Attends Archivist Meetings: Never  . Marital Status: Married  Human resources officer Violence: Not At Risk  . Fear of Current or Ex-Partner: No  . Emotionally Abused: No  . Physically Abused: No  . Sexually Abused: No    Past Surgical History:  Procedure Laterality Date  . ANKLE FUSION Bilateral   . ANKLE SURGERY Bilateral    ligament surgery  . BIOPSY  04/29/2020   Procedure: BIOPSY;  Surgeon: Lavena Bullion, DO;  Location: [facility];   Service: Gastroenterology;;  . CARDIAC CATHETERIZATION  x 2   1983; 11/14/2003  . CARPOMETACARPEL SUSPENSION PLASTY Right 09/22/2016   Procedure: right thumb carpometacarpal  ARTHROPLASTY;  Surgeon: Leanora Cover, MD;  Location: [facility];  Service: Orthopedics;  Laterality: Right;  right thumb carpometacarpal  ARTHROPLASTY  . CARPOMETACARPEL SUSPENSION PLASTY Right 11/02/2017   Procedure: RIGHT THUMB SUSPENSIONPLASTY WITH TIGHTROPE, DISTAL POLE SCAPHOID  EXCISION;  Surgeon: Leanora Cover, MD;  Location: [place];  Service: Orthopedics;  Laterality: Right;  . COLONOSCOPY WITH PROPOFOL N/A 10/02/2019   Procedure: COLONOSCOPY WITH PROPOFOL;  Surgeon: Lavena Bullion, DO;  Location: [facility];  Service: Gastroenterology;  Laterality: N/A;  . COLONOSCOPY WITH PROPOFOL N/A 10/09/2019   Procedure: COLONOSCOPY WITH PROPOFOL;  Surgeon: Gatha Mayer, MD;  Location: [facility];  Service: Endoscopy;  Laterality: N/A;  . COLONOSCOPY WITH PROPOFOL N/A 04/29/2020   Procedure: COLONOSCOPY WITH PROPOFOL;  Surgeon: Lavena Bullion, DO;  Location: [facility];  Service: Gastroenterology;  Laterality: N/A;  . ELBOW SURGERY Left   . ENDOSCOPIC MUCOSAL RESECTION N/A 10/02/2019   Procedure: ENDOSCOPIC MUCOSAL RESECTION;  Surgeon: Lavena Bullion, DO;  [facility];  Service: Gastroenterology;  Laterality: N/A;  . HEMOSTASIS CLIP PLACEMENT  10/09/2019   Procedure: HEMOSTASIS CLIP PLACEMENT;  Surgeon: Gatha Mayer, MD;  [facility];  Service: Endoscopy;;  . INGUINAL HERNIA REPAIR Right   . OPEN REDUCTION INTERNAL FIXATION (ORIF) DISTAL RADIAL FRACTURE Right 10/29/2015   Procedure: OPEN REDUCTION INTERNAL FIXATION (ORIF) DISTAL RADIAL FRACTURE;  Surgeon: Leanora Cover, MD;  Location: [facility] [facility];  Service: Orthopedics;  Laterality: Right;  . ORIF ELBOW FRACTURE Right   . POLYPECTOMY  04/29/2020   Procedure: POLYPECTOMY;  Surgeon: Lavena Bullion, DO;  [facility];  Service: Gastroenterology;;  . Lia Foyer LIFTING INJECTION  10/02/2019   Procedure: SUBMUCOSAL LIFTING INJECTION;  Surgeon: Lavena Bullion, DO;  [facility];  Service: Gastroenterology;;  . TONSILLECTOMY    . TOTAL KNEE ARTHROPLASTY Bilateral   . ULNAR COLLATERAL LIGAMENT REPAIR Right 12/29/2015   Procedure: REPAIR  RIGHT LATERAL ULNAR COLLATERAL LIGAMENT TEAR  EXTENSOR ORIGIN ;  Surgeon: Leanora Cover, MD;  Location: [place];  Service: Orthopedics;  Laterality: Right;  . WRIST ARTHROSCOPY WITH DEBRIDEMENT Right 07/14/2016   Procedure: RIGHT WRIST ARTHROSCOPY WITH DEBRIDEMENT  TRIANGULAR FIBROCARTILAGE COMPLEX;  Surgeon: Leanora Cover, MD;  Location: [facility];  Service: Orthopedics;  Laterality: Right;    Family History  Problem Relation Age of Onset  . Alcohol abuse Mother   . Ovarian cancer Mother   . Alcohol abuse Father   . Pancreatic cancer Father   . Depression Daughter   . Paranoid behavior  Daughter   . Hyperlipidemia Brother   . Barrett's esophagus Brother   . Colon cancer Maternal Grandmother   . Esophageal cancer Neg Hx   . Stomach cancer Neg Hx   . Rectal cancer Neg Hx     Allergies  Allergen Reactions  . Penicillins Other (See Comments)    BLISTERS Did it involve swelling of the face/tongue/throat, SOB, or low BP? No Did it involve sudden or severe rash/hives, skin peeling, or any reaction on the inside of your mouth or nose? No Did you need to seek medical attention at a hospital or doctor's office? No When did it last happen?around 1980 If all above answers are "NO", may proceed with cephalosporin use.   Marland Kitchen Lisinopril     Dry cough, abdominal bloating and diarrhea  . Codeine Rash    Current Outpatient Medications on File Prior to Visit  Medication Sig Dispense Refill  . albuterol (VENTOLIN HFA) 108 (90 Base) MCG/ACT inhaler Inhale 2 puffs into the lungs every 6 (six) hours as  needed for wheezing or shortness of breath. 6.7 g 3  . ARIPiprazole ER (ABILIFY MAINTENA) 400 MG PRSY prefilled syringe Inject 400 mg into the muscle every 28 (twenty-eight) days. 1 each 2  . benztropine (COGENTIN) 1 MG tablet Take 1 tablet (1 mg total) by mouth daily. 90 tablet 0  . citalopram (CELEXA) 10 MG tablet Take 3 tablets (30 mg total) by mouth daily. 270 tablet 0  . ezetimibe (ZETIA) 10 MG tablet Take 1 tablet (10 mg total) by mouth daily. 90 tablet 3  . losartan (COZAAR) 50 MG tablet Take 1 tablet (50 mg total) by mouth daily. 90 tablet 1  . Multiple Vitamin (MULTIVITAMIN WITH MINERALS) TABS tablet Take 1 tablet by mouth daily.    . Multiple Vitamins-Minerals (PRESERVISION AREDS 2 PO) Take 1 capsule by mouth 2 (two) times daily.    . Omega-3 Fatty Acids (FISH OIL PO) Take 1 capsule by mouth in the morning and at bedtime.    Marland Kitchen oxymetazoline (AFRIN) 0.05 % nasal spray Place 1 spray into both nostrils 2 (two) times daily.    . pantoprazole (PROTONIX) 40 MG tablet Take 1 tablet (40 mg total) by mouth 2 (two) times daily. 60 tablet 1  . predniSONE (DELTASONE) 10 MG tablet 40 mg x 3 days, 20 mg x 3 days, 10 mg x 3 days 21 tablet 0  . temazepam (RESTORIL) 30 MG capsule Take 1 capsule (30 mg total) by mouth at bedtime. 90 capsule 0  . traZODone (DESYREL) 50 MG tablet Take 1 tablet (50 mg total) by mouth at bedtime. 90 tablet 0  . Vitamin D, Cholecalciferol, 1000 UNITS TABS Take 1,000 Units by mouth daily.      Current Facility-Administered Medications on File Prior to Visit  Medication Dose Route Frequency Provider Last Rate Last Admin  . ARIPiprazole ER (ABILIFY MAINTENA) 400 MG prefilled syringe 400 mg  400 mg Intramuscular Q28 days Arfeen, Arlyce Harman, MD   400 mg at 12/16/20 0935    BP 128/72 (BP Location: Left Arm, Patient Position: Sitting, Cuff Size: Large)   Pulse 67   Temp 98.3 F (36.8 C) (Oral)   Ht 5\' 4"  (1.626 m)   Wt 257 lb 12.8 oz (116.9 kg)   SpO2 95%   BMI 44.25 kg/m        Objective:   Physical Exam Vitals and nursing note reviewed.  Constitutional:      Appearance: Normal appearance.  Cardiovascular:  Rate and Rhythm: Normal rate and regular rhythm.     Pulses: Normal pulses.     Heart sounds: Normal heart sounds.  Pulmonary:     Effort: Pulmonary effort is normal.     Breath sounds: Decreased air movement present. No stridor. Examination of the right-upper field reveals wheezing. Examination of the left-upper field reveals wheezing. Examination of the right-middle field reveals wheezing. Examination of the left-middle field reveals wheezing. Examination of the right-lower field reveals wheezing. Examination of the left-lower field reveals wheezing. Wheezing present. No rhonchi or rales.  Neurological:     Mental Status: He is alert.  Psychiatric:        Mood and Affect: Mood normal.        Behavior: Behavior normal.        Thought Content: Thought content normal.        Judgment: Judgment normal.       Assessment & Plan:  1. Moderate persistent asthma with acute exacerbation -Appears to be asthma exacerbation -Spiriva 1.25mg  sample given in the office today, after first dose he did notice improvement. - DG Chest 2 View; Future - HYDROcodone-homatropine (HYCODAN) 5-1.5 MG/5ML syrup; Take 5 mLs by mouth every 8 (eight) hours as needed for cough.  Dispense: 120 mL; Refill: 0 - guaiFENesin (MUCINEX) 600 MG 12 hr tablet; Take 1 tablet (600 mg total) by mouth 2 (two) times daily for 10 days.  Dispense: 20 tablet; Refill: 0  [name], NP

## 2021-01-06 ENCOUNTER — Telehealth: Payer: Self-pay | Admitting: Pharmacist

## 2021-01-06 NOTE — Chronic Care Management (AMB) (Addendum)
:   Chronic Care Management Pharmacy Assistant   Name: Sean Hill  MRN: [id number] DOB: 1941-06-15  Reason for Encounter: Medication Review-Medication Coordination Call   Recent office visits:  04.12.2022 [name], NP Family Medicine Medication Changes: Prescribed Azithromycin 250 MG Take 2 tablets on Day 1.  Then take 1 tablet daily.  Recent consult visits:  04.21.2022 [name], MD Psychiatry  Hospital visits:  None in previous 6 months  Medications: Outpatient Encounter Medications as of 01/06/2021  Medication Sig   albuterol (VENTOLIN HFA) 108 (90 Base) MCG/ACT inhaler Inhale 2 puffs into the lungs every 6 (six) hours as needed for wheezing or shortness of breath.   ARIPiprazole ER (ABILIFY MAINTENA) 400 MG PRSY prefilled syringe Inject 400 mg into the muscle every 28 (twenty-eight) days.   benztropine (COGENTIN) 1 MG tablet Take 1 tablet (1 mg total) by mouth daily.   citalopram (CELEXA) 10 MG tablet Take 3 tablets (30 mg total) by mouth daily.   ezetimibe (ZETIA) 10 MG tablet Take 1 tablet (10 mg total) by mouth daily.   guaiFENesin (MUCINEX) 600 MG 12 hr tablet Take 1 tablet (600 mg total) by mouth 2 (two) times daily for 10 days.   HYDROcodone-homatropine (HYCODAN) 5-1.5 MG/5ML syrup Take 5 mLs by mouth every 8 (eight) hours as needed for cough.   losartan (COZAAR) 50 MG tablet Take 1 tablet (50 mg total) by mouth daily.   Multiple Vitamin (MULTIVITAMIN WITH MINERALS) TABS tablet Take 1 tablet by mouth daily.   Multiple Vitamins-Minerals (PRESERVISION AREDS 2 PO) Take 1 capsule by mouth 2 (two) times daily.   Omega-3 Fatty Acids (FISH OIL PO) Take 1 capsule by mouth in the morning and at bedtime.   oxymetazoline (AFRIN) 0.05 % nasal spray Place 1 spray into both nostrils 2 (two) times daily.   pantoprazole (PROTONIX) 40 MG tablet Take 1 tablet (40 mg total) by mouth 2 (two) times daily.   predniSONE (DELTASONE) 10 MG tablet 40 mg x 3 days, 20 mg x 3 days,  10 mg x 3 days   temazepam (RESTORIL) 30 MG capsule Take 1 capsule (30 mg total) by mouth at bedtime.   traZODone (DESYREL) 50 MG tablet Take 1 tablet (50 mg total) by mouth at bedtime.   Vitamin D, Cholecalciferol, 1000 UNITS TABS Take 1,000 Units by mouth daily.    Facility-Administered Encounter Medications as of 01/06/2021  Medication   ARIPiprazole ER (ABILIFY MAINTENA) 400 MG prefilled syringe 400 mg   Reviewed chart for medication changes ahead of medication coordination call.  BP Readings from Last 3 Encounters:  01/05/21 128/72  11/19/20 (!) 170/76  08/07/20 136/72    Lab Results  Component Value Date   HGBA1C 6.0 12/25/2019     Patient obtains medications through Adherence Packaging  90 Days  Last adherence delivery included:  Citalopram (CELEXA) 20 mg: one tablet at breakfast Trazodone (DESYREL) 50 mg: one capsule at bedtime Benztropine (COGENTIN) 1 mg: one tablet at lunch Ezetimibe (ZETIA) 10 mg: one tablet at lunch Losartan (COZAAR) 50 MG tablet: one tablet at breakfast Temazepam (RESTORIL) 30 mg: one capsule at bedtime Chlorpheniramine 4 mg: one tablet at breakfast Citalopram (CELEXA) 10 mg: one tablet at breakfast Omega-3 Fatty Acids 1,000 Mg Capsule: One at breakfast and one bedtime Preservision Areds-2gt: one at breakfast Men's 50 plus multivitamin: one tablet at breakfast Vitamin D3 25 Mcg: one tablet at breakfast Cetirizine 10 mg: one tablet at breakfast  Patient declined the following medication last  month due to PRN use. Albuterol Aer Hfa (PRN) Tobramycin 0.3% eyedrops (PRN)  I spoke with the patient and reviewed medications. There are no changes in medications currently. The patient declined these medications this month due to PRN use/additional supply on hand. The patient is taking the following medications: 90 day supply last filled on 02.28.2022 Citalopram (CELEXA) 30 mg: one tablet at breakfast Trazodone (DESYREL) 50 mg: one capsule at  bedtime Benztropine (COGENTIN) 1 mg: one tablet at lunch Ezetimibe (ZETIA) 10 mg: one tablet at lunch Losartan (COZAAR) 50 MG tablet: one tablet at breakfast Temazepam (RESTORIL) 30 mg: one capsule at bedtime Chlorpheniramine 4 mg: one tablet at breakfast Omega-3 Fatty Acids 1,000 Mg Capsule: One at breakfast and one bedtime Preservision Areds-2gt: one at breakfast Men's 50 plus multivitamin: one tablet at breakfast Vitamin D3 25 Mcg: one tablet at breakfast Cetirizine 10 mg: one tablet at breakfast Pantoprazole (PROTONIX) 40 mg: one tablet at breakfast and at dinner  Albuterol Aer Hfa (PRN) Tobramycin 0.3% eyedrops (PRN)  He currently does not needed refills.  Star Rating Drugs:   Dispensed Quantity Pharmacy  Losartan 50 mg 02.28.2022 [facility]      Amilia Revonda Standard, [place] [phone]

## 2021-01-15 ENCOUNTER — Ambulatory Visit (INDEPENDENT_AMBULATORY_CARE_PROVIDER_SITE_OTHER): Payer: Medicare HMO | Admitting: *Deleted

## 2021-01-15 ENCOUNTER — Other Ambulatory Visit: Payer: Self-pay

## 2021-01-15 VITALS — BP 129/71 | HR 80 | Wt 262.2 lb

## 2021-01-15 DIAGNOSIS — F319 Bipolar disorder, unspecified: Secondary | ICD-10-CM | POA: Diagnosis not present

## 2021-01-15 DIAGNOSIS — R69 Illness, unspecified: Secondary | ICD-10-CM | POA: Diagnosis not present

## 2021-01-15 NOTE — Progress Notes (Signed)
Pt presents today for due Abilify Maintena 400 mg injection. Pt appropriate and cooperative on approach. Injection prepared as ordered and given in left upper outer quadrent without complaint. Pt to return in approximately 4 weeks for next due injection. Of note PT O2 SAT @ 94% r/t to ongoing pulmonary issues which are being addressed by PCP.

## 2021-01-19 ENCOUNTER — Inpatient Hospital Stay: Admission: RE | Admit: 2021-01-19 | Payer: Medicare HMO | Source: Ambulatory Visit

## 2021-01-19 ENCOUNTER — Ambulatory Visit
Admission: RE | Admit: 2021-01-19 | Discharge: 2021-01-19 | Disposition: A | Discharge status: Home / Self Care | Payer: Medicare HMO | Source: Ambulatory Visit | Attending: Adult Health | Admitting: Adult Health

## 2021-01-19 ENCOUNTER — Other Ambulatory Visit: Payer: Self-pay

## 2021-01-19 DIAGNOSIS — J8410 Pulmonary fibrosis, unspecified: Secondary | ICD-10-CM | POA: Diagnosis not present

## 2021-01-19 DIAGNOSIS — R059 Cough, unspecified: Secondary | ICD-10-CM

## 2021-01-19 DIAGNOSIS — I2510 Atherosclerotic heart disease of native coronary artery without angina pectoris: Secondary | ICD-10-CM | POA: Diagnosis not present

## 2021-01-19 DIAGNOSIS — J45909 Unspecified asthma, uncomplicated: Secondary | ICD-10-CM | POA: Diagnosis not present

## 2021-01-19 DIAGNOSIS — J849 Interstitial pulmonary disease, unspecified: Secondary | ICD-10-CM | POA: Diagnosis not present

## 2021-02-02 ENCOUNTER — Telehealth (INDEPENDENT_AMBULATORY_CARE_PROVIDER_SITE_OTHER): Payer: Medicare HMO | Admitting: Psychiatry

## 2021-02-02 ENCOUNTER — Other Ambulatory Visit: Payer: Self-pay

## 2021-02-02 ENCOUNTER — Encounter (HOSPITAL_COMMUNITY): Payer: Self-pay | Admitting: Psychiatry

## 2021-02-02 DIAGNOSIS — R69 Illness, unspecified: Secondary | ICD-10-CM | POA: Diagnosis not present

## 2021-02-02 DIAGNOSIS — F319 Bipolar disorder, unspecified: Secondary | ICD-10-CM

## 2021-02-02 DIAGNOSIS — F411 Generalized anxiety disorder: Secondary | ICD-10-CM | POA: Diagnosis not present

## 2021-02-02 MED ORDER — TEMAZEPAM 30 MG PO CAPS: 30.0000 mg | ORAL_CAPSULE | Freq: Every day | ORAL | 0 refills | Status: DC

## 2021-02-02 MED ORDER — CITALOPRAM HYDROBROMIDE 20 MG PO TABS: 20.0000 mg | ORAL_TABLET | Freq: Every day | ORAL | 0 refills | Status: DC

## 2021-02-02 MED ORDER — BENZTROPINE MESYLATE 0.5 MG PO TABS: 0.5000 mg | ORAL_TABLET | Freq: Every day | ORAL | 0 refills | Status: DC

## 2021-02-02 MED ORDER — ARIPIPRAZOLE ER 400 MG IM PRSY: 400.0000 mg | PREFILLED_SYRINGE | INTRAMUSCULAR | 2 refills | Status: DC
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
## 2021-02-04 ENCOUNTER — Telehealth: Payer: Self-pay | Admitting: Pharmacist

## 2021-02-04 NOTE — Chronic Care Management (AMB) (Signed)
Chronic Care Management Pharmacy Assistant   Name: Sean Hill  MRN: [id number] DOB: February 28, 1941  Reason for Encounter: Medication Review-Medication Coordination Call   Recent office visits:  None  Recent consult visits:  . 05.17.2022 [name], MD Psychiatry patient seen for follow-up o Medication Changed to o Benztropine Mesylate 0.5 mg Oral Daily  o Citalopram Hydrobromide 20 mg Oral Daily   Hospital visits:  None in previous 6 months  Medications: Outpatient Encounter Medications as of 02/04/2021  Medication Sig  . albuterol (VENTOLIN HFA) 108 (90 Base) MCG/ACT inhaler Inhale 2 puffs into the lungs every 6 (six) hours as needed for wheezing or shortness of breath.  . ARIPiprazole ER (ABILIFY MAINTENA) 400 MG PRSY prefilled syringe Inject 400 mg into the muscle every 28 (twenty-eight) days.  . benztropine (COGENTIN) 0.5 MG tablet Take 1 tablet (0.5 mg total) by mouth daily.  . citalopram (CELEXA) 20 MG tablet Take 1 tablet (20 mg total) by mouth daily.  Marland Kitchen ezetimibe (ZETIA) 10 MG tablet Take 1 tablet (10 mg total) by mouth daily.  Marland Kitchen losartan (COZAAR) 50 MG tablet Take 1 tablet (50 mg total) by mouth daily.  . Multiple Vitamin (MULTIVITAMIN WITH MINERALS) TABS tablet Take 1 tablet by mouth daily. (Patient not taking: Reported on 02/02/2021)  . Multiple Vitamins-Minerals (PRESERVISION AREDS 2 PO) Take 1 capsule by mouth 2 (two) times daily.  . Omega-3 Fatty Acids (FISH OIL PO) Take 1 capsule by mouth in the morning and at bedtime.  Marland Kitchen oxymetazoline (AFRIN) 0.05 % nasal spray Place 1 spray into both nostrils 2 (two) times daily. (Patient not taking: Reported on 02/02/2021)  . pantoprazole (PROTONIX) 40 MG tablet Take 1 tablet (40 mg total) by mouth 2 (two) times daily.  . temazepam (RESTORIL) 30 MG capsule Take 1 capsule (30 mg total) by mouth at bedtime.  . traZODone (DESYREL) 50 MG tablet Take 1 tablet (50 mg total) by mouth at bedtime. (Patient not taking: Reported on  02/02/2021)  . Vitamin D, Cholecalciferol, 1000 UNITS TABS Take 1,000 Units by mouth daily.    Facility-Administered Encounter Medications as of 02/04/2021  Medication  . ARIPiprazole ER (ABILIFY MAINTENA) 400 MG prefilled syringe 400 mg   Reviewed chart for medication changes ahead of medication coordination call.  BP Readings from Last 3 Encounters:  01/05/21 128/72  11/19/20 (!) 170/76  08/07/20 136/72    Lab Results  Component Value Date   HGBA1C 6.0 12/25/2019    Patient obtains medications through Adherence Packaging  90 Days  Last adherence delivery included: (medication name and frequency) . Citalopram (CELEXA) 30 mg: one tablet at breakfast . Trazodone (DESYREL) 50 mg: one capsule at bedtime . Benztropine (COGENTIN) 1 mg: one tablet at lunch . Ezetimibe (ZETIA) 10 mg: one tablet at lunch . Losartan (COZAAR) 50 MG tablet: one tablet at breakfast . Temazepam (RESTORIL) 30 mg: one capsule at bedtime . Chlorpheniramine 4 mg: one tablet at breakfast . Omega-3 Fatty Acids 1,000 Mg Capsule: One at breakfast and one bedtime . Preservision Areds-2gt: one at breakfast . Men's 50 plus multivitamin: one tablet at breakfast . Vitamin D3 25 Mcg: one tablet at breakfast . Cetirizine 10 mg: one tablet at breakfast . Pantoprazole (PROTONIX) 40 mg: one tablet at breakfast and at dinner  . Albuterol Aer Hfa (PRN) . Tobramycin 0.3% eyedrops (PRN) Patient declined the following medications last month due to PRN use/additional supply on hand.  Patient is due for next adherence delivery on: 02/15/2021. Called  patient and reviewed medications and coordinated delivery. This delivery to include: . Citalopram (CELEXA) 20 mg: one tablet at breakfast . Benztropine (COGENTIN) 0.5 mg: one tablet at Bedtime . Ezetimibe (ZETIA) 10 mg: one tablet at lunch . Losartan (COZAAR) 50 MG tablet: one tablet at breakfast . Temazepam (RESTORIL) 30 mg: one capsule at bedtime . Chlorpheniramine 4 mg: one  tablet at breakfast . Omega-3 Fatty Acids 1,000 Mg Capsule: One at breakfast and one bedtime . Preservision Areds-2gt: one at breakfast . Men's 50 plus multivitamin: one tablet at breakfast . Vitamin D3 25 Mcg: one tablet at breakfast . Cetirizine 10 mg: one tablet at breakfast . Pantoprazole (PROTONIX) 40 mg: one tablet at breakfast and at dinner   Patient declined the following medications due to PRN use . Albuterol Aer Hfa (PRN) . Tobramycin 0.3% eyedrops (PRN)  He currently does not need refills.   Confirmed delivery date of 02/05/2021, advised patient that pharmacy will contact them the morning of delivery.  Star Rating Drugs:  Dispensed Quantity Pharmacy  Losartan 50 mg 02.28.2022 [facility]   Amilia Revonda Standard, [place] Pharmacist Assistant [phone]

## 2021-02-11 ENCOUNTER — Other Ambulatory Visit: Payer: Self-pay | Admitting: Gastroenterology

## 2021-02-12 ENCOUNTER — Ambulatory Visit (INDEPENDENT_AMBULATORY_CARE_PROVIDER_SITE_OTHER): Payer: Medicare HMO | Admitting: *Deleted

## 2021-02-12 ENCOUNTER — Other Ambulatory Visit: Payer: Self-pay

## 2021-02-12 ENCOUNTER — Encounter (HOSPITAL_COMMUNITY): Payer: Self-pay | Admitting: *Deleted

## 2021-02-12 VITALS — BP 138/82 | HR 80 | Resp 20 | Ht 65.0 in | Wt 260.8 lb

## 2021-02-12 DIAGNOSIS — R69 Illness, unspecified: Secondary | ICD-10-CM | POA: Diagnosis not present

## 2021-02-12 DIAGNOSIS — F319 Bipolar disorder, unspecified: Secondary | ICD-10-CM

## 2021-02-12 NOTE — Progress Notes (Signed)
Pt in office today for due Abilify Maintena 400 mg injection. Pt appropriate and cooperative on approach. Mood pleasant and calm. Pt only physical c/o is ongoing issue with right knee. Injection prepared as ordered and given in RIGHT upper outer quadrant with no c/o. Pt to return in approximately 4 weeks for next due injection.

## 2021-02-22 ENCOUNTER — Telehealth (HOSPITAL_COMMUNITY): Payer: Self-pay

## 2021-02-22 NOTE — Telephone Encounter (Signed)
[employer] MEDICARE PRESCRIPTION COVERAGE APPROVED  TEMAZEPAM 30MG  CAPSULE CASE # [id number] EFFECTIVE 09/19/2020 TO 09/18/2021 S/W JESSICA  S/W PHARMACY & THEY RAN IT. COST TO PT IS $65 IF PT GOES THROUGH INSURANCE. OUT OF POCKET COST FOR PT IS $40 SO PT WILL PAY FOR HIS TEMAZEPAM

## 2021-02-24 DIAGNOSIS — H353222 Exudative age-related macular degeneration, left eye, with inactive choroidal neovascularization: Secondary | ICD-10-CM | POA: Diagnosis not present

## 2021-02-24 DIAGNOSIS — H353211 Exudative age-related macular degeneration, right eye, with active choroidal neovascularization: Secondary | ICD-10-CM | POA: Diagnosis not present

## 2021-03-08 DIAGNOSIS — Z96651 Presence of right artificial knee joint: Secondary | ICD-10-CM | POA: Diagnosis not present

## 2021-03-08 DIAGNOSIS — M25561 Pain in right knee: Secondary | ICD-10-CM | POA: Diagnosis not present

## 2021-03-10 ENCOUNTER — Telehealth: Payer: Self-pay | Admitting: Pharmacist

## 2021-03-10 NOTE — Chronic Care Management (AMB) (Signed)
    Chronic Care Management Pharmacy Assistant   Name: Sean Hill  MRN: [id number] DOB: [birth year]  03/10/21- Called patient to remind of appointment with Jeni Salles) on (03/11/21 at 1pm by phone)  Patient aware of appointment date, time, and type of appointment (either telephone or in person). Patient aware to have/bring all medications, supplements, blood pressure and/or blood sugar logs to visit.  Questions: Have you had any recent office visit or specialist visit outside of [place]? Yes. He saw an orthopedist at [facility]. Are there any concerns you would like to discuss during your office visit? Patient cannot think of nay concerns at this time.  Are you having any problems obtaining your medications? (Whether it pharmacy issues or cost) No issues at this time.  If patient has any PAP medications ask if they are having any problems getting their PAP medication or refill? No PAP.  Star Rating Drug:  Losartan 50mg  - last filled on 02/11/21 90DS at [facility]  Any gaps in medications fill history? NoHoover Browns CMA  Clinical Pharmacist Assistant [phone]

## 2021-03-11 ENCOUNTER — Ambulatory Visit (INDEPENDENT_AMBULATORY_CARE_PROVIDER_SITE_OTHER): Payer: Medicare HMO | Admitting: Pharmacist

## 2021-03-11 ENCOUNTER — Other Ambulatory Visit (HOSPITAL_COMMUNITY): Payer: Self-pay | Admitting: Psychiatry

## 2021-03-11 DIAGNOSIS — E7849 Other hyperlipidemia: Secondary | ICD-10-CM | POA: Diagnosis not present

## 2021-03-11 DIAGNOSIS — F319 Bipolar disorder, unspecified: Secondary | ICD-10-CM

## 2021-03-11 DIAGNOSIS — I10 Essential (primary) hypertension: Secondary | ICD-10-CM

## 2021-03-11 DIAGNOSIS — K2100 Gastro-esophageal reflux disease with esophagitis, without bleeding: Secondary | ICD-10-CM

## 2021-03-11 NOTE — Progress Notes (Signed)
Chronic Care Management Pharmacy Note  03/11/2021 Name:  Sean Hill MRN:  [id number] DOB:  [birth year]  Summary: Patient is not sleeping throughout the night  Recommendations/Changes made from today's visit: -Recommended trial of melatonin 3 mg OTC timed release for help with staying asleep -Removed trazodone from medication list as patient is no longer taking -Recommended scheduling CPE with PCP as patient is overdue for annual labs (lipid panel, A1c, TSH, etc.)  Plan: Follow up in 4 months  Subjective: Sean Hill is an 80 y.o. year old male who is a primary patient of [name], NP.  The CCM team was consulted for assistance with disease management and care coordination needs.    Engaged with patient by telephone for follow up visit in response to provider referral for pharmacy case management and/or care coordination services.   Consent to Services:  The patient was given information about Chronic Care Management services, agreed to services, and gave verbal consent prior to initiation of services.  Please see initial visit note for detailed documentation.   Patient Care Team: [name], NP as PCP - General (Family Medicine) Leanora Cover, MD as Consulting Physician (Orthopedic Surgery) Viona Gilmore, [facility] as Pharmacist (Pharmacist)  Recent office visits: 01/05/21 [name], NP: Patient presented for cough follow up. Prescribed Mucinex and Hydrocan.  12/29/20 [name], NP: Patient presented for video visit for cough. Prescribed Z-pak.   11/19/20 [name], NP: Patient presented for video visit for sinusitis. Prescribed doxycycline.  09/25/20 [name], LPN: Patient presented for medicare annual wellness visit.  06/19/20 [name], NP: Patient presented for video visit for HTN. Switched lisinopril to losartan daily due to cough.   Recent consult visits: 03/08/21 Theresa Duty, PA ([facility]): Unable to access notes.  02/12/21  Alison Murray, LPN (behavioral health): Patient presented for aripiprazole injection.  02/02/21 Berniece Andreas, MD (behavioral health): Patient presented for bipolar follow up. Decreased Cogentin to 0.5 mg and d/c'd trazodone.  12/23/20 Berniece Andreas, MD (behavioral health): Patient presented for bipolar follow up and recent anxiety. Continued medications.  11/16/20 Alison Murray, LPN (behavioral health): Patient presented for aripiprazole injection. Patient reported increased anxiety. Dr. Adele Schilder increased citalopram to 30 mg daily.  10/19/20 Alison Murray, LPN (behavioral health): Patient presented for aripiprazole injection.  10/01/20 Gracelyn Nurse (psychiatry): Unable to access notes.  09/23/20 Berniece Andreas, MD (behavioral health): Patient presented for bipolar follow up.  07/24/20 Sherlynn Stalls (ophthalmology): Patient presented for macular degeneration follow up. Unable to access notes.  06/11/20 Everitt Amber (ophthalmology): Patient presented for new eye exam. Unable to access notes.  Hospital visits: 08/07/20 Patient presented to the ED for pneumonia.   Objective:  Lab Results  Component Value Date   CREATININE 0.81 08/07/2020   BUN 11 08/07/2020   GFR 59.27 (L) 12/25/2019   GFRNONAA >60 08/07/2020   GFRAA >60 10/09/2019   NA 136 08/07/2020   K 4.4 08/07/2020   CALCIUM 9.0 08/07/2020   CO2 25 08/07/2020    Lab Results  Component Value Date/Time   HGBA1C 6.0 12/25/2019 08:23 AM   HGBA1C 6.1 11/07/2018 07:41 AM   GFR 59.27 (L) 12/25/2019 08:23 AM   GFR 61.23 11/07/2018 07:41 AM   MICROALBUR 1.5 11/28/2011 04:28 PM    Last diabetic Eye exam: No results found for: HMDIABEYEEXA  Last diabetic Foot exam: No results found for: HMDIABFOOTEX   Lab Results  Component Value Date   CHOL 140 12/25/2019   HDL 32.00 (L) 12/25/2019   LDLDIRECT  72.0 12/25/2019   TRIG 265.0 (H) 12/25/2019   CHOLHDL 4 12/25/2019    Hepatic Function Latest Ref Rng & Units 08/07/2020 12/25/2019  10/09/2019  Total Protein 6.5 - 8.1 g/dL 7.2 6.7 6.4(L)  Albumin 3.5 - 5.0 g/dL 3.9 4.5 3.9  AST 15 - 41 U/L _0 ALT 0 - 44 U/L _1 Alk Phosphatase 38 - 126 U/L 63 59 59  Total Bilirubin 0.3 - 1.2 mg/dL 1.0 0.5 0.6  Bilirubin, Direct 0.0 - 0.3 mg/dL - - -    Lab Results  Component Value Date/Time   TSH 2.30 12/25/2019 08:23 AM   TSH 1.69 11/07/2018 07:41 AM   FREET4 0.89 07/21/2011 06:52 PM    CBC Latest Ref Rng & Units 08/07/2020 12/25/2019 10/10/2019  WBC 4.0 - 10.5 K/uL 9.0 4.9 6.5  Hemoglobin 13.0 - 17.0 g/dL 14.3 14.9 12.0(L)  Hematocrit 39.0 - 52.0 % 41.7 44.6 35.6(L)  Platelets 150 - 400 K/uL 189 245.0 219    No results found for: VD25OH  Clinical ASCVD: No  The 10-year ASCVD risk score Mikey Bussing DC Jr., et al., 2013) is: 38.5%   Values used to calculate the score:     Age: 80 years     Sex: Male     Is Non-Hispanic African American: No     Diabetic: No     Tobacco smoker: No     Systolic Blood Pressure: 573 mmHg     Is BP treated: Yes     HDL Cholesterol: 32 mg/dL     Total Cholesterol: 140 mg/dL    Depression screen [facility] 2/9 09/25/2020 11/07/2018 09/21/2017  Decreased Interest 0 0 0  Down, Depressed, Hopeless 0 0 -  PHQ - 2 Score 0 0 0  Some recent data might be hidden      Social History   Tobacco Use  Smoking Status Never  Smokeless Tobacco Never   BP Readings from Last 3 Encounters:  02/12/21 138/82  01/15/21 129/71  01/05/21 128/72   Pulse Readings from Last 3 Encounters:  02/12/21 80  01/15/21 80  01/05/21 67   Wt Readings from Last 3 Encounters:  02/12/21 260 lb 12.8 oz (118.3 kg)  02/02/21 262 lb (118.8 kg)  01/15/21 262 lb 3.2 oz (118.9 kg)    Assessment/Interventions: Review of patient past medical history, allergies, medications, health status, including review of consultants reports, laboratory and other test data, was performed as part of comprehensive evaluation and provision of chronic care management services.   SDOH:   (Social Determinants of Health) assessments and interventions performed: No   CCM Care Plan  Allergies  Allergen Reactions   Penicillins Other (See Comments)    BLISTERS Did it involve swelling of the face/tongue/throat, SOB, or low BP? No Did it involve sudden or severe rash/hives, skin peeling, or any reaction on the inside of your mouth or nose? No Did you need to seek medical attention at a hospital or doctor's office? No When did it last happen? around 1980    If all above answers are "NO", may proceed with cephalosporin use.    Lisinopril     Dry cough, abdominal bloating and diarrhea   Codeine Rash    Medications Reviewed Today     Reviewed by [name], MD (Psychiatrist) on 02/02/21 at 1512  Med List Status: <None>   Medication Order Taking? Sig Documenting Provider Last Dose Status Informant  albuterol (VENTOLIN HFA) 108 (90 Base) MCG/ACT inhaler  [id number]  Inhale 2 puffs into the lungs every 6 (six) hours as needed for wheezing or shortness of breath. Nafziger, Tommi Rumps, NP  Active   ARIPiprazole ER (ABILIFY MAINTENA) 400 MG prefilled syringe 400 mg [id number]   Arfeen, Arlyce Harman, MD  Active   ARIPiprazole ER (ABILIFY MAINTENA) 400 MG PRSY prefilled syringe [id number]  Inject 400 mg into the muscle every 28 (twenty-eight) days. [name], MD  Active   benztropine (COGENTIN) 1 MG tablet [id number]  Take 1 tablet (1 mg total) by mouth daily. [name], MD  Active   citalopram (CELEXA) 10 MG tablet [id number]  Take 3 tablets (30 mg total) by mouth daily. [name], MD  Active   ezetimibe (ZETIA) 10 MG tablet [id number]  Take 1 tablet (10 mg total) by mouth daily. Nafziger, Tommi Rumps, NP  Active   losartan (COZAAR) 50 MG tablet [id number]  Take 1 tablet (50 mg total) by mouth daily. Nafziger, Tommi Rumps, NP  Active   Multiple Vitamin (MULTIVITAMIN WITH MINERALS) TABS tablet [id number] No Take 1 tablet by mouth daily.  Patient not taking: Reported on 02/02/2021   [provider] Not Taking Consider Medication Status and Discontinue Self  Multiple Vitamins-Minerals (PRESERVISION AREDS 2 PO) [id number]  Take 1 capsule by mouth 2 (two) times daily. [provider]  Active Self  Omega-3 Fatty Acids (FISH OIL PO) [id number]  Take 1 capsule by mouth in the morning and at bedtime. [provider]  Active Self  oxymetazoline (AFRIN) 0.05 % nasal spray [id number] No Place 1 spray into both nostrils 2 (two) times daily.  Patient not taking: Reported on 02/02/2021   [provider] Not Taking Active   pantoprazole (PROTONIX) 40 MG tablet [id number]  Take 1 tablet (40 mg total) by mouth 2 (two) times daily. Cirigliano, Vito V, DO  Active   temazepam (RESTORIL) 30 MG capsule [id number]  Take 1 capsule (30 mg total) by mouth at bedtime. [name], MD  Active   traZODone (DESYREL) 50 MG tablet [id number] No Take 1 tablet (50 mg total) by mouth at bedtime.  Patient not taking: Reported on 02/02/2021   [name], MD Not Taking Active   Vitamin D, Cholecalciferol, 1000 UNITS TABS [id number]  Take 1,000 Units by mouth daily.  [provider]  Active Self            Patient Active Problem List   Diagnosis Date Noted   Bipolar affective disorder, currently depressed, mild ([place]) 10/21/2019   Hemorrhage of colon following colonoscopy 10/08/2019   Adenomatous polyp of ascending colon    Tubulovillous adenoma of colon    History of colonic polyps    Diverticulosis of colon without hemorrhage    Grade I internal hemorrhoids    Essential hypertension 08/23/2016   Left bundle branch block (LBBB) on electrocardiogram 06/10/2016   Hyperlipidemia 06/07/2016   Left leg swelling 01/06/2015   Change in bowel habits 01/06/2015   Gastroesophageal reflux disease 11/28/2011   Bipolar disorder ([place]) 11/25/2011    Immunization History  Administered Date(s) Administered   Fluad Quad(high Dose 65+) 05/29/2019   Influenza Inj Mdck Quad Pf  05/29/2019   Influenza, High Dose Seasonal PF 06/07/2016, 05/15/2017   Influenza-Unspecified 07/20/2013, 06/16/2020   PFIZER Comirnaty(Gray Top)Covid-19 Tri-Sucrose Vaccine 01/29/2021   PFIZER(Purple Top)SARS-COV-2 Vaccination 10/25/2019, 11/19/2019, 06/16/2020   Pneumococcal Conjugate-13 03/11/2015   Pneumococcal Polysaccharide-23 07/06/2010   Tdap 07/06/2010, 10/29/2015   Zoster Recombinat (Shingrix) 01/29/2021    Conditions  to be addressed/monitored:  Hypertension, Hyperlipidemia, Diabetes, GERD, Allergic Rhinitis and insomnia, bipolar disorder  Care Plan : [place]  Updates made by Viona Gilmore, [place] since 03/11/2021 12:00 AM     Problem: Problem: Hypertension, Hyperlipidemia, Diabetes, GERD, Allergic Rhinitis and insomnia, bipolar disorder      Long-Range Goal: Patient-Specific Goal   Start Date: 11/11/2020  Expected End Date: 11/11/2021  Recent Progress: On track  Priority: High  Note:   Current Barriers:  Unable to independently monitor therapeutic efficacy Does not contact provider office for questions/concerns  Pharmacist Clinical Goal(s):  Patient will achieve adherence to monitoring guidelines and medication adherence to achieve therapeutic efficacy through collaboration with PharmD and provider.    Interventions: 1:1 collaboration with [name], NP regarding development and update of comprehensive plan of care as evidenced by provider attestation and co-signature Inter-disciplinary care team collaboration (see longitudinal plan of care) Comprehensive medication review performed; medication list updated in electronic medical record  Hypertension (BP goal <140/90) -Uncontrolled -Current treatment: Losartan 50 mg 1 tablet daily  -Medications previously tried: lisinopril (cough) -Current home readings: 140-145/70-80s  -Current dietary habits:  tries to avoid using salt with foods -Current exercise habits:  patient does not do any structured  exercise due to shoulder pain and double knee replacements; patient can walk and would like to start being more active -Denies hypotensive/hypertensive symptoms -Educated on BP goals and benefits of medications for prevention of heart attack, stroke and kidney damage; Exercise goal of 150 minutes per week; Importance of home blood pressure monitoring; -Counseled to monitor BP at home weekly, document, and provide log at future appointments -Counseled on diet and exercise extensively Recommended to continue current medication Recommended setting a goal for exercise he is able to accomplish such as 15 minutes 2-3 days a week  Hyperlipidemia: (LDL goal < 100) -Controlled -Current treatment: Ezetimibe  10 mg 1 tablet daily Fish oil 1000 mg capsule twice daily -Medications previously tried: statins  -Current dietary patterns: tries to avoid fatty foods -Current exercise habits: patient does not do any structured exercise due to shoulder pain and double knee replacements; patient can walk and would like to start being more active -Educated on Cholesterol goals;  Importance of limiting foods high in cholesterol; Exercise goal of 150 minutes per week; -Recommended to continue current medication Recommended repeat lipid panel  Pre-diabetes (A1c goal <6.5%) -Controlled -Current medications: No medications -Medications previously tried: none  -Current home glucose readings fasting glucose: none post prandial glucose: none -Denies hypoglycemic/hyperglycemic symptoms -Current meal patterns:  breakfast: did not discuss  lunch: did not discuss  dinner: did not discuss snacks: did not discuss drinks: did not discuss -Current exercise: patient does not do any structured exercise due to shoulder pain and double knee replacements; patient can walk and would like to start being more active -Educated onCarbohydrate counting and/or plate method -Counseled to check feet daily and get yearly eye  exams -Counseled on diet and exercise extensively Provided healthy plate handout  GERD (Goal: minimize symptoms of heartburn/acid reflux) -Controlled -Current treatment  Pantoprazole 32m, 1 tablet twice daily -Medications previously tried: none  -Recommended to continue current medication  Bipolar disorder (Goal: minimize symptoms) -Controlled -Current treatment  Aripiprazole ER 4034m inject 4009mvery twenty-eight days  Citalopram 57m18m tablet once daily -Medications previously tried: unknown  -Recommended to continue current medication  Sinus congestion (Goal: minimize symptoms of congestion) -Uncontrolled -Current treatment  Fluticasone nasal spray, 1 spray into both nostrils in the  morning and at bedtime   Zyrtec 10 mg daily -Medications previously tried: chlopheniramine (switched to safer alternative Zyrtec) -Recommended to continue current medication  Insomnia (Goal: improve quality and quantity of sleep) -Controlled -Current treatment  Temazepam 47m, 1 capsule at bedtime  -Medications previously tried: several medications, trazodone (ineffective) -Recommended to continue current medication Counseled on practicing good sleep hygiene by setting a sleep schedule and maintaining it, avoid excessive napping, following a nightly routine, avoiding screen time for 30-60 minutes before going to bed, and making the bedroom a cool, quiet and dark space  Recommended trial of melatonin timed release to help with staying asleep  Health Maintenance -Vaccine gaps: second dose of shingles -Current therapy:  Multivitamin, 1 tablet once daily Preservision AREDS 2, 1 capsule twice daily Vitamin D3, 1,000 units once daily  -Educated on Cost vs benefit of each product must be carefully weighed by individual consumer -Patient is satisfied with current therapy and denies issues -Recommended to continue current medication   Patient Goals/Self-Care Activities Patient will:  - take  medications as prescribed check blood pressure weekly, document, and provide at future appointments target a minimum of 150 minutes of moderate intensity exercise weekly  Follow Up Plan: Telephone follow up appointment with care management team member scheduled for: 4 months       Medication Assistance: None required.  Patient affirms current coverage meets needs.  Compliance/Adherence/Medication fill history: Care Gaps: Hep C screening  Star-Rating Drugs: Losartan - last filled 02/11/21 for 90 ds at [facility]  Patient's preferred [facility] is:  [facility] [place] [address] [address] [id number]: [phone]: [phone] Uses pill box? No - adherence packaging Pt endorses 100% compliance  We discussed: Benefits of medication synchronization, packaging and delivery as well as enhanced pharmacist oversight with [facility]. Patient decided to: Utilize [facility] pharmacy for medication synchronization, packaging and delivery  Coordinated acute fill for benztropine and melatonin.  Care Plan and Follow Up Patient Decision:  Patient agrees to Care Plan and Follow-up.  Plan: Telephone follow up appointment with care management team member scheduled for:  4 months  MJeni Salles PharmD BWarrior RunPharmacist LSeldoviaat B[place]anks [phone]

## 2021-03-11 NOTE — Patient Instructions (Signed)
Hi Ronalee Belts,  It was great catching up with you again! Keep up the good work with checking your blood pressure at home! I think setting a goal of 15 minutes of exercise 2-3 times a week would be really helpful to try to help you get back into it and I think you will feel much better as well.  Please reach out to me if you have any questions or need anything before our follow up!  Best, Maddie  Jeni Salles, PharmD, [facility]   Visit Information   Goals Addressed   None    Patient Care Plan: CCM Pharmacy Care Plan     Problem Identified: Problem: Hypertension, Hyperlipidemia, Diabetes, GERD, Allergic Rhinitis and insomnia, bipolar disorder      Long-Range Goal: Patient-Specific Goal   Start Date: 11/11/2020  Expected End Date: 11/11/2021  Recent Progress: On track  Priority: High  Note:   Current Barriers:  Unable to independently monitor therapeutic efficacy Does not contact provider office for questions/concerns  Pharmacist Clinical Goal(s):  Patient will achieve adherence to monitoring guidelines and medication adherence to achieve therapeutic efficacy through collaboration with PharmD and provider.    Interventions: 1:1 collaboration with [name], NP regarding development and update of comprehensive plan of care as evidenced by provider attestation and co-signature Inter-disciplinary care team collaboration (see longitudinal plan of care) Comprehensive medication review performed; medication list updated in electronic medical record  Hypertension (BP goal <140/90) -Uncontrolled -Current treatment: Losartan 50 mg 1 tablet daily  -Medications previously tried: lisinopril (cough) -Current home readings: 140-145/70-80s  -Current dietary habits:  tries to avoid using salt with foods -Current exercise habits:  patient does not do any structured exercise due to shoulder pain and double knee replacements;  patient can walk and would like to start being more active -Denies hypotensive/hypertensive symptoms -Educated on BP goals and benefits of medications for prevention of heart attack, stroke and kidney damage; Exercise goal of 150 minutes per week; Importance of home blood pressure monitoring; -Counseled to monitor BP at home weekly, document, and provide log at future appointments -Counseled on diet and exercise extensively Recommended to continue current medication Recommended setting a goal for exercise he is able to accomplish such as 15 minutes 2-3 days a week  Hyperlipidemia: (LDL goal < 100) -Controlled -Current treatment: Ezetimibe  10 mg 1 tablet daily Fish oil 1000 mg capsule twice daily -Medications previously tried: statins  -Current dietary patterns: tries to avoid fatty foods -Current exercise habits: patient does not do any structured exercise due to shoulder pain and double knee replacements; patient can walk and would like to start being more active -Educated on Cholesterol goals;  Importance of limiting foods high in cholesterol; Exercise goal of 150 minutes per week; -Recommended to continue current medication Recommended repeat lipid panel  Pre-diabetes (A1c goal <6.5%) -Controlled -Current medications: No medications -Medications previously tried: none  -Current home glucose readings fasting glucose: none post prandial glucose: none -Denies hypoglycemic/hyperglycemic symptoms -Current meal patterns:  breakfast: did not discuss  lunch: did not discuss  dinner: did not discuss snacks: did not discuss drinks: did not discuss -Current exercise: patient does not do any structured exercise due to shoulder pain and double knee replacements; patient can walk and would like to start being more active -Educated onCarbohydrate counting and/or plate method -Counseled to check feet daily and get yearly eye exams -Counseled on diet and exercise extensively Provided  healthy plate handout  GERD (Goal: minimize  symptoms of heartburn/acid reflux) -Controlled -Current treatment  Pantoprazole 40mg , 1 tablet twice daily -Medications previously tried: none  -Recommended to continue current medication  Bipolar disorder (Goal: minimize symptoms) -Controlled -Current treatment  Aripiprazole ER 400mg , inject 400mg  every twenty-eight days  Citalopram 20mg , 1 tablet once daily -Medications previously tried: unknown  -Recommended to continue current medication  Sinus congestion (Goal: minimize symptoms of congestion) -Uncontrolled -Current treatment  Fluticasone nasal spray, 1 spray into both nostrils in the morning and at bedtime   Zyrtec 10 mg daily -Medications previously tried: chlopheniramine (switched to safer alternative Zyrtec) -Recommended to continue current medication  Insomnia (Goal: improve quality and quantity of sleep) -Controlled -Current treatment  Temazepam 30mg , 1 capsule at bedtime  -Medications previously tried: several medications, trazodone (ineffective) -Recommended to continue current medication Counseled on practicing good sleep hygiene by setting a sleep schedule and maintaining it, avoid excessive napping, following a nightly routine, avoiding screen time for 30-60 minutes before going to bed, and making the bedroom a cool, quiet and dark space  Recommended trial of melatonin timed release to help with staying asleep  Health Maintenance -Vaccine gaps: second dose of shingles -Current therapy:  Multivitamin, 1 tablet once daily Preservision AREDS 2, 1 capsule twice daily Vitamin D3, 1,000 units once daily  -Educated on Cost vs benefit of each product must be carefully weighed by individual consumer -Patient is satisfied with current therapy and denies issues -Recommended to continue current medication   Patient Goals/Self-Care Activities Patient will:  - take medications as prescribed check blood pressure weekly,  document, and provide at future appointments target a minimum of 150 minutes of moderate intensity exercise weekly  Follow Up Plan: Telephone follow up appointment with care management team member scheduled for: 4 months       Patient verbalizes understanding of instructions provided today and agrees to view in [place].  Telephone follow up appointment with pharmacy team member scheduled for:4 months  Viona Gilmore, [facility]

## 2021-03-17 ENCOUNTER — Other Ambulatory Visit: Payer: Self-pay

## 2021-03-17 ENCOUNTER — Ambulatory Visit (INDEPENDENT_AMBULATORY_CARE_PROVIDER_SITE_OTHER): Payer: Medicare HMO | Admitting: *Deleted

## 2021-03-17 VITALS — BP 142/80 | HR 80 | Resp 20 | Ht 65.0 in | Wt 264.4 lb

## 2021-03-17 DIAGNOSIS — F319 Bipolar disorder, unspecified: Secondary | ICD-10-CM | POA: Diagnosis not present

## 2021-03-17 DIAGNOSIS — R69 Illness, unspecified: Secondary | ICD-10-CM | POA: Diagnosis not present

## 2021-03-17 DIAGNOSIS — M25511 Pain in right shoulder: Secondary | ICD-10-CM | POA: Diagnosis not present

## 2021-03-17 NOTE — Progress Notes (Signed)
Pt in office today for due Abilify Maintena 400 mg injection. Pt pleasant and cooperative on approach. Injection prepared as ordered and given in left upper outer quadrant without c/o. Pt to return in approximately 4 weeks for next due injection.

## 2021-03-18 DIAGNOSIS — R69 Illness, unspecified: Secondary | ICD-10-CM | POA: Diagnosis not present

## 2021-03-24 ENCOUNTER — Telehealth (HOSPITAL_COMMUNITY): Payer: Medicare HMO | Admitting: Psychiatry

## 2021-04-05 DIAGNOSIS — E785 Hyperlipidemia, unspecified: Secondary | ICD-10-CM | POA: Diagnosis not present

## 2021-04-05 DIAGNOSIS — J45909 Unspecified asthma, uncomplicated: Secondary | ICD-10-CM | POA: Diagnosis not present

## 2021-04-05 DIAGNOSIS — I10 Essential (primary) hypertension: Secondary | ICD-10-CM | POA: Diagnosis not present

## 2021-04-05 DIAGNOSIS — I2510 Atherosclerotic heart disease of native coronary artery without angina pectoris: Secondary | ICD-10-CM | POA: Diagnosis not present

## 2021-04-05 DIAGNOSIS — Z008 Encounter for other general examination: Secondary | ICD-10-CM | POA: Diagnosis not present

## 2021-04-05 DIAGNOSIS — R69 Illness, unspecified: Secondary | ICD-10-CM | POA: Diagnosis not present

## 2021-04-05 DIAGNOSIS — F411 Generalized anxiety disorder: Secondary | ICD-10-CM | POA: Diagnosis not present

## 2021-04-05 DIAGNOSIS — Z6836 Body mass index (BMI) 36.0-36.9, adult: Secondary | ICD-10-CM | POA: Diagnosis not present

## 2021-04-05 DIAGNOSIS — K219 Gastro-esophageal reflux disease without esophagitis: Secondary | ICD-10-CM | POA: Diagnosis not present

## 2021-04-05 DIAGNOSIS — M1990 Unspecified osteoarthritis, unspecified site: Secondary | ICD-10-CM | POA: Diagnosis not present

## 2021-04-15 ENCOUNTER — Other Ambulatory Visit: Payer: Self-pay

## 2021-04-15 ENCOUNTER — Ambulatory Visit (INDEPENDENT_AMBULATORY_CARE_PROVIDER_SITE_OTHER): Payer: Medicare HMO | Admitting: *Deleted

## 2021-04-15 VITALS — BP 148/77 | HR 75 | Resp 20 | Ht 65.0 in | Wt 263.2 lb

## 2021-04-15 DIAGNOSIS — F319 Bipolar disorder, unspecified: Secondary | ICD-10-CM

## 2021-04-15 DIAGNOSIS — R69 Illness, unspecified: Secondary | ICD-10-CM | POA: Diagnosis not present

## 2021-04-15 NOTE — Progress Notes (Signed)
Pt in clinic today for due Abilify Maintena 400 mg injection. Pt appropriate and cooperative on approach. Injection prepared as ordered and given in right upper outer quadrant without compliant. Pt getting ready for upcoming ortho surgery. No c/o. Sean Hill is to return in approximately 4 weeks for next due injection.

## 2021-04-22 DIAGNOSIS — Z96651 Presence of right artificial knee joint: Secondary | ICD-10-CM | POA: Diagnosis not present

## 2021-04-28 ENCOUNTER — Other Ambulatory Visit: Payer: Self-pay

## 2021-04-29 ENCOUNTER — Encounter: Payer: Self-pay | Admitting: Adult Health

## 2021-04-29 ENCOUNTER — Ambulatory Visit (INDEPENDENT_AMBULATORY_CARE_PROVIDER_SITE_OTHER): Payer: Medicare HMO | Admitting: Adult Health

## 2021-04-29 VITALS — BP 110/70 | HR 78 | Temp 98.4°F | Ht 65.0 in | Wt 266.0 lb

## 2021-04-29 DIAGNOSIS — G4709 Other insomnia: Secondary | ICD-10-CM

## 2021-04-29 DIAGNOSIS — J4541 Moderate persistent asthma with (acute) exacerbation: Secondary | ICD-10-CM | POA: Diagnosis not present

## 2021-04-29 DIAGNOSIS — R69 Illness, unspecified: Secondary | ICD-10-CM | POA: Diagnosis not present

## 2021-04-29 DIAGNOSIS — Z0000 Encounter for general adult medical examination without abnormal findings: Secondary | ICD-10-CM | POA: Diagnosis not present

## 2021-04-29 DIAGNOSIS — I10 Essential (primary) hypertension: Secondary | ICD-10-CM

## 2021-04-29 DIAGNOSIS — K2100 Gastro-esophageal reflux disease with esophagitis, without bleeding: Secondary | ICD-10-CM

## 2021-04-29 DIAGNOSIS — E7849 Other hyperlipidemia: Secondary | ICD-10-CM | POA: Diagnosis not present

## 2021-04-29 DIAGNOSIS — Z1159 Encounter for screening for other viral diseases: Secondary | ICD-10-CM

## 2021-04-29 DIAGNOSIS — Z125 Encounter for screening for malignant neoplasm of prostate: Secondary | ICD-10-CM | POA: Diagnosis not present

## 2021-04-29 DIAGNOSIS — F3131 Bipolar disorder, current episode depressed, mild: Secondary | ICD-10-CM

## 2021-04-29 LAB — COMPREHENSIVE METABOLIC PANEL
ALT: 31 U/L (ref 0–53)
AST: 35 U/L (ref 0–37)
Albumin: 4.1 g/dL (ref 3.5–5.2)
Alkaline Phosphatase: 91 U/L (ref 39–117)
BUN: 9 mg/dL (ref 6–23)
CO2: 27 mEq/L (ref 19–32)
Calcium: 9.7 mg/dL (ref 8.4–10.5)
Chloride: 103 mEq/L (ref 96–112)
Creatinine, Ser: 0.84 mg/dL (ref 0.40–1.50)
GFR: 83.6 mL/min (ref >60.00)
Glucose, Bld: 95 mg/dL (ref 70–99)
Potassium: 4.3 mEq/L (ref 3.5–5.1)
Sodium: 140 mEq/L (ref 135–145)
Total Bilirubin: 0.7 mg/dL (ref 0.2–1.2)
Total Protein: 6.6 g/dL (ref 6.0–8.3)

## 2021-04-29 LAB — CBC WITH DIFFERENTIAL/PLATELET
Basophils Absolute: 0 10*3/uL (ref 0.0–0.1)
Basophils Relative: 0.5 % (ref 0.0–3.0)
Eosinophils Absolute: 0.1 10*3/uL (ref 0.0–0.7)
Eosinophils Relative: 2 % (ref 0.0–5.0)
HCT: 46.1 % (ref 39.0–52.0)
Hemoglobin: 15.6 g/dL (ref 13.0–17.0)
Lymphocytes Relative: 39.3 % (ref 12.0–46.0)
Lymphs Abs: 1.8 10*3/uL (ref 0.7–4.0)
MCHC: 33.9 g/dL (ref 30.0–36.0)
MCV: 86.8 fl (ref 78.0–100.0)
Monocytes Absolute: 0.4 10*3/uL (ref 0.1–1.0)
Monocytes Relative: 8.7 % (ref 3.0–12.0)
Neutro Abs: 2.3 10*3/uL (ref 1.4–7.7)
Neutrophils Relative %: 49.5 % (ref 43.0–77.0)
Platelets: 209 10*3/uL (ref 150.0–400.0)
RBC: 5.31 Mil/uL (ref 4.22–5.81)
RDW: 14.2 % (ref 11.5–15.5)
WBC: 4.7 10*3/uL (ref 4.0–10.5)

## 2021-04-29 LAB — HEMOGLOBIN A1C: Hgb A1c MFr Bld: 6 % (ref 4.6–6.5)

## 2021-04-29 LAB — LIPID PANEL
Cholesterol: 200 mg/dL (ref 0–200)
HDL: 34.6 mg/dL — ABNORMAL LOW (ref >39.00)
Total CHOL/HDL Ratio: 6
Triglycerides: 742 mg/dL — ABNORMAL HIGH (ref 0.0–149.0)

## 2021-04-29 LAB — TSH: TSH: 1.9 u[IU]/mL (ref 0.35–5.50)

## 2021-04-29 LAB — PSA: PSA: 0.28 ng/mL (ref 0.10–4.00)

## 2021-04-29 LAB — LDL CHOLESTEROL, DIRECT: Direct LDL: 65 mg/dL

## 2021-04-29 NOTE — Patient Instructions (Signed)
It was great seeing you today   We will follow up with you regarding your lab work   Best of luck on the surgery!

## 2021-04-29 NOTE — Progress Notes (Signed)
Subjective:    Patient ID: Sean Hill, male    DOB: 12/09/1940, 80 y.o.   MRN: [id number]  HPI Patient presents for yearly preventative medicine examination.He is a pleasant 80 year old male who  has a past medical history of Anal fissure, Anxiety, Arthritis, Asthma, Bipolar disorder ([place]), Cataract, Depression, GERD (gastroesophageal reflux disease), Hemorrhage of colon following colonoscopy (10/08/2019), History of kidney stones, History of MRSA infection, Hyperlipidemia, Hypertension, Impaired hearing, Left bundle branch block (LBBB) on electrocardiogram (10/29/2015), Macular degeneration (senile) of retina, and Wears glasses.  Hypertension -prescribed losartan 50 mg daily.  He denies dizziness, lightheadedness, chest pain, shortness of breath BP Readings from Last 3 Encounters:  04/29/21 110/70  01/05/21 128/72  11/19/20 (!) 170/76   Hyperlipidemia -statin intolerant.  Currently prescribed Zetia 10 mg daily and fish oil 1000 mg daily. Lab Results  Component Value Date   CHOL 140 12/25/2019   HDL 32.00 (L) 12/25/2019   LDLDIRECT 72.0 12/25/2019   TRIG 265.0 (H) 12/25/2019   CHOLHDL 4 12/25/2019   GERD - Controlled with Protonix 40 mg daily   Bipolar Disorder -current treatment includes citalopram 20 mg daily, Cogentrin 0.5 mg daily, and Aripiprazole ER 400 mg, inject every 28 days. He feels well controlled.   Insomnia -describe restoral 30 mg nightly and trazodone 50 mg nightly.  He does feel as though he gets a good night sleep with this combination  Asthma -uses albuterol inhaler as needed, feels as though this has not been helping.  Has days of chronic dry cough, wheezing, mild shortness of breath.  Does not use a maintenance inhaler in the past  All immunizations and health maintenance protocols were reviewed with the patient and needed orders were placed.  Appropriate screening laboratory values were ordered for the patient including screening of hyperlipidemia, renal  function and hepatic function.   Medication reconciliation,  past medical history, social history, problem list and allergies were reviewed in detail with the patient  Goals were established with regard to weight loss, exercise, and  diet in compliance with medications  Wt Readings from Last 3 Encounters:  04/29/21 266 lb (120.7 kg)  01/05/21 257 lb 12.8 oz (116.9 kg)  11/19/20 260 lb (117.9 kg)   He has an upcoming right total knee replacement being done in October 2022.   Review of Systems  Constitutional: Negative.   HENT: Negative.    Eyes: Negative.   Respiratory:  Positive for cough, shortness of breath and wheezing.   Cardiovascular: Negative.   Gastrointestinal: Negative.   Endocrine: Negative.   Genitourinary: Negative.   Musculoskeletal:  Positive for arthralgias and gait problem.  Skin: Negative.   Allergic/Immunologic: Negative.   Hematological: Negative.   Psychiatric/Behavioral: Negative.    All other systems reviewed and are negative.     Objective:   Physical Exam Vitals and nursing note reviewed.  Constitutional:      General: He is not in acute distress.    Appearance: Normal appearance. He is well-developed. He is obese.  HENT:     Head: Normocephalic and atraumatic.     Right Ear: Tympanic membrane, ear canal and external ear normal. There is no impacted cerumen.     Left Ear: Tympanic membrane, ear canal and external ear normal. There is no impacted cerumen.     Nose: Nose normal. No congestion or rhinorrhea.     Mouth/Throat:     Mouth: Mucous membranes are moist.     Pharynx: Oropharynx is clear. No  oropharyngeal exudate or posterior oropharyngeal erythema.  Eyes:     General:        Right eye: No discharge.        Left eye: No discharge.     Extraocular Movements: Extraocular movements intact.     Conjunctiva/sclera: Conjunctivae normal.     Pupils: Pupils are equal, round, and reactive to light.  Neck:     Vascular: No carotid bruit.      Trachea: No tracheal deviation.  Cardiovascular:     Rate and Rhythm: Normal rate and regular rhythm.     Pulses: Normal pulses.     Heart sounds: Normal heart sounds. No murmur heard.   No friction rub. No gallop.  Pulmonary:     Effort: Pulmonary effort is normal. No respiratory distress.     Breath sounds: Normal breath sounds. No stridor. No wheezing, rhonchi or rales.  Chest:     Chest wall: No tenderness.  Abdominal:     General: Bowel sounds are normal. There is no distension.     Palpations: Abdomen is soft. There is no mass.     Tenderness: There is no abdominal tenderness. There is no right CVA tenderness, left CVA tenderness, guarding or rebound.     Hernia: No hernia is present.  Musculoskeletal:        General: No swelling, tenderness, deformity or signs of injury. Normal range of motion.     Right lower leg: No edema.     Left lower leg: No edema.  Lymphadenopathy:     Cervical: No cervical adenopathy.  Skin:    General: Skin is warm and dry.     Capillary Refill: Capillary refill takes less than 2 seconds.     Coloration: Skin is not jaundiced or pale.     Findings: No bruising, erythema, lesion or rash.  Neurological:     General: No focal deficit present.     Mental Status: He is alert and oriented to person, place, and time.     Cranial Nerves: No cranial nerve deficit.     Sensory: No sensory deficit.     Motor: No weakness.     Coordination: Coordination normal.     Gait: Gait normal.     Deep Tendon Reflexes: Reflexes normal.  Psychiatric:        Mood and Affect: Mood normal.        Behavior: Behavior normal.        Thought Content: Thought content normal.        Judgment: Judgment normal.      Assessment & Plan:  1. Routine general medical examination at a health care facility  - CBC with Differential/Platelet; Future - Hemoglobin A1c; Future - Comprehensive metabolic panel; Future - Lipid panel; Future - TSH; Future - TSH - Lipid panel -  Comprehensive metabolic panel - Hemoglobin A1c - CBC with Differential/Platelet  2. Essential hypertension - Controlled; no change in medication  - CBC with Differential/Platelet; Future - Hemoglobin A1c; Future - Comprehensive metabolic panel; Future - Lipid panel; Future - TSH; Future - TSH - Lipid panel - Comprehensive metabolic panel - Hemoglobin A1c - CBC with Differential/Platelet  3. Other hyperlipidemia - Consider adding fenofibrate  - CBC with Differential/Platelet; Future - Hemoglobin A1c; Future - Comprehensive metabolic panel; Future - Lipid panel; Future - TSH; Future - TSH - Lipid panel - Comprehensive metabolic panel - Hemoglobin A1c - CBC with Differential/Platelet  4. Gastroesophageal reflux disease with esophagitis without hemorrhage -  continue with PPI - CBC with Differential/Platelet; Future - Hemoglobin A1c; Future - Comprehensive metabolic panel; Future - Lipid panel; Future - TSH; Future - TSH - Lipid panel - Comprehensive metabolic panel - Hemoglobin A1c - CBC with Differential/Platelet  5. Bipolar affective disorder, currently depressed, mild ([place]) -Continue with plan of care by psychiatry  6. Other insomnia -Continue plan of care by psychiatry  7. Prostate cancer screening  - PSA; Future - PSA  8. Need for hepatitis C screening test  - Hep C Antibody; Future - Hep C Antibody  9. Moderate persistent asthma with acute exacerbation - Will look to see what maintenance inhaler is covered by insurance  [name], NP

## 2021-04-30 ENCOUNTER — Other Ambulatory Visit: Payer: Self-pay | Admitting: Adult Health

## 2021-04-30 LAB — HEPATITIS C ANTIBODY
Hepatitis C Ab: NONREACTIVE
SIGNAL TO CUT-OFF: 0.01 (ref <1.00)

## 2021-04-30 MED ORDER — FENOFIBRATE 145 MG PO TABS: 145.0000 mg | ORAL_TABLET | Freq: Every day | ORAL | 3 refills | Status: DC

## 2021-05-02 ENCOUNTER — Other Ambulatory Visit: Payer: Self-pay | Admitting: Adult Health

## 2021-05-02 DIAGNOSIS — I10 Essential (primary) hypertension: Secondary | ICD-10-CM

## 2021-05-03 ENCOUNTER — Encounter: Payer: Self-pay | Admitting: Adult Health

## 2021-05-04 ENCOUNTER — Telehealth: Payer: Self-pay | Admitting: Pharmacist

## 2021-05-04 NOTE — Chronic Care Management (AMB) (Addendum)
Chronic Care Management Pharmacy Assistant   Name: Sean Hill  MRN: [id number] DOB: 05-Nov-1940  Reason for Encounter: Disease State and Medication Review (hypertension)   Conditions to be addressed/monitored: HTN  Recent office visits:  04-29-2021 Patient presented for Routine general medical examination and other concerns. No medication changes.  Recent consult visits:  04-15-2021 Mardis Michelle,LPN [facility]) - Patient presented for Bipolar Disorder, no other visit details available.  03-18-2021 Tresa Res, LCSW (Psychiatry) - Patient presented for anxiety and depression. No medication changes noted.  03-17-2021 Darol Destine [facility]) - Patient presented for pain of right shoulder joint. No medication changes noted.   Hospital visits:  None in previous 6 months  Medications: Outpatient Encounter Medications as of 05/04/2021  Medication Sig   albuterol (VENTOLIN HFA) 108 (90 Base) MCG/ACT inhaler Inhale 2 puffs into the lungs every 6 (six) hours as needed for wheezing or shortness of breath. (Patient not taking: Reported on 03/11/2021)   ARIPiprazole ER (ABILIFY MAINTENA) 400 MG PRSY prefilled syringe Inject 400 mg into the muscle every 28 (twenty-eight) days.   benztropine (COGENTIN) 0.5 MG tablet Take 1 tablet (0.5 mg total) by mouth daily.   citalopram (CELEXA) 20 MG tablet Take 1 tablet (20 mg total) by mouth daily.   ezetimibe (ZETIA) 10 MG tablet TAKE ONE TABLET BY MOUTH AT NOON   fenofibrate (TRICOR) 145 MG tablet Take 1 tablet (145 mg total) by mouth daily.   losartan (COZAAR) 50 MG tablet TAKE ONE TABLET BY MOUTH EVERY MORNING   Multiple Vitamin (MULTIVITAMIN WITH MINERALS) TABS tablet Take 1 tablet by mouth daily. (Patient not taking: Reported on 02/02/2021)   Multiple Vitamins-Minerals (PRESERVISION AREDS 2 PO) Take 1 capsule by mouth 2 (two) times daily.   Omega-3 Fatty Acids (FISH OIL PO) Take 1 capsule by mouth in the morning and at  bedtime.   pantoprazole (PROTONIX) 40 MG tablet Take 1 tablet (40 mg total) by mouth 2 (two) times daily. Please call [phone] to schedule an office visit for more refills   temazepam (RESTORIL) 30 MG capsule Take 1 capsule (30 mg total) by mouth at bedtime.   Vitamin D, Cholecalciferol, 1000 UNITS TABS Take 1,000 Units by mouth daily.    Facility-Administered Encounter Medications as of 05/04/2021  Medication   ARIPiprazole ER (ABILIFY MAINTENA) 400 MG prefilled syringe 400 mg  Reviewed chart prior to disease state call. Spoke with patient regarding BP  Recent Office Vitals: BP Readings from Last 3 Encounters:  04/29/21 110/70  01/05/21 128/72  11/19/20 (!) 170/76   Pulse Readings from Last 3 Encounters:  04/29/21 78  01/05/21 67  08/07/20 70    Wt Readings from Last 3 Encounters:  04/29/21 266 lb (120.7 kg)  01/05/21 257 lb 12.8 oz (116.9 kg)  11/19/20 260 lb (117.9 kg)     Kidney Function Lab Results  Component Value Date/Time   CREATININE 0.84 04/29/2021 11:06 AM   CREATININE 0.81 08/07/2020 12:25 PM   CREATININE 1.00 09/24/2019 12:21 PM   CREATININE 0.88 12/03/2014 03:47 PM   GFR 83.60 04/29/2021 11:06 AM   GFRNONAA >60 08/07/2020 12:25 PM   GFRNONAA 73 09/24/2019 12:21 PM   GFRAA >60 10/09/2019 05:00 AM   GFRAA 84 09/24/2019 12:21 PM    BMP Latest Ref Rng & Units 04/29/2021 08/07/2020 12/25/2019  Glucose 70 - 99 mg/dL 95 108(H) 111(H)  BUN 6 - 23 mg/dL '9 11 18  '$ Creatinine 0.40 - 1.50 mg/dL 0.84 0.81 1.19  BUN/Creat Ratio 6 - 22 (calc) - - -  Sodium 135 - 145 mEq/L 140 136 140  Potassium 3.5 - 5.1 mEq/L 4.3 4.4 4.7  Chloride 96 - 112 mEq/L 103 102 104  CO2 19 - 32 mEq/L '27 25 28  '$ Calcium 8.4 - 10.5 mg/dL 9.7 9.0 9.7    Current antihypertensive regimen:  Losartan 50 mg 1 tablet daily How often are you checking your Blood Pressure? Patient reports he is not checking regularly Current home BP readings: He reports he checks less than an hour after taking  medications and it has been running 130-140 over 80 What recent interventions/DTPs have been made by any provider to improve Blood Pressure control since last CPP Visit: Patient reports none Any recent hospitalizations or ED visits since last visit with CPP? No What diet changes have been made to improve Blood Pressure Control?  Patient reports he has been trying to monitor salt intake. For breakfast he will have sausage and egg. For Lunch a sandwich or eats out and for dinner will have pasta. He reports he is not big on veggies will eat green beans or salad. He reports he is drinking water most all day. What exercise is being done to improve your Blood Pressure Control?  Patient reports he is in pain now due to have a knee that was replaced in the past worked on again and it is making it hard for him to get around as he would like.  Adherence Review: Is the patient currently on ACE/ARB medication? Yes Does the patient have >5 day gap between last estimated fill dates? No  Reviewed chart for medication changes ahead of medication coordination call.  No OVs, Consults, or hospital visits since last care coordination call/Pharmacist visit.   No medication changes indicated   BP Readings from Last 3 Encounters:  04/29/21 110/70  01/05/21 128/72  11/19/20 (!) 170/76    Lab Results  Component Value Date   HGBA1C 6.0 04/29/2021     Patient obtains medications through Adherence Packaging  90 Days   Last adherence delivery included:  Citalopram (CELEXA) 20 mg: one tablet at breakfast Benztropine (COGENTIN) 0.5 mg: one tablet at Bedtime Ezetimibe (ZETIA) 10 mg: one tablet at lunch Losartan (COZAAR) 50 MG tablet: one tablet at breakfast Temazepam (RESTORIL) 30 mg: one capsule at bedtime Chlorpheniramine 4 mg: one tablet at breakfast Omega-3 Fatty Acids 1,000 Mg Capsule: One at breakfast and one bedtime Preservision Areds-2gt: one at breakfast Men's 50 plus multivitamin: one tablet at  breakfast Vitamin D3 25 Mcg: one tablet at breakfast Cetirizine 10 mg: one tablet at breakfast Pantoprazole (PROTONIX) 40 mg: one tablet at breakfast and at dinner    Patient declined the following medications due to PRN use Albuterol Aer Hfa (PRN) Tobramycin 0.3% eyedrops (PRN)   Patient is due for next adherence delivery on: 05-14-2021. Called patient and reviewed medications and coordinated delivery.  This delivery to include: Pantoprazole (PROTONIX) 40 mg: one tablet at breakfast and at dinner  Preservision Areds-2gt: one at breakfast Certavite senior- one at breakfast Vitamin D3 25 Mcg: one tablet at breakfast Fish oil 1000 - take one at breakfast and one at evening meal Cetirizine 10 mg: one tablet at breakfast Ezetimibe (ZETIA) 10 mg: one tablet at lunch Temazepam (RESTORIL) 30 mg: one capsule at bedtime Losartan (COZAAR) 50 MG tablet: one tablet at breakfast Citalopram (CELEXA) 20 mg: one tablet at breakfast Benztropine (COGENTIN) 0.5 mg: one tablet at Breakfast Fenofibrate 145 mg: one tablet at  breakfast  Confirmed delivery date of 05-14-2021, advised patient that pharmacy will contact them the morning of delivery.   Notes: Patient reports for his medication he has just seen [facility] and they discussed him starting fenofibrate and was curious if it will be added to this upcoming delivery. No script in chart forwarded to CPP for further clarification she will speak to PCP and add to delivery if needed.    Care Gaps: Zoster Vaccine - Overdue Flu Vaccine - Overdue CCM F/U Call - 07-09-21 at 12 AWV - Done 09-25-2020  Star Rating Drugs: Losartan (Cozaar) 50 mg - Last filled 02-11-2021 90 DS at [facility] Pharmacist Assistant [phone]

## 2021-05-05 ENCOUNTER — Other Ambulatory Visit: Payer: Self-pay

## 2021-05-05 ENCOUNTER — Encounter (HOSPITAL_COMMUNITY): Payer: Self-pay | Admitting: Psychiatry

## 2021-05-05 ENCOUNTER — Telehealth (INDEPENDENT_AMBULATORY_CARE_PROVIDER_SITE_OTHER): Payer: Medicare HMO | Admitting: Psychiatry

## 2021-05-05 DIAGNOSIS — F319 Bipolar disorder, unspecified: Secondary | ICD-10-CM

## 2021-05-05 DIAGNOSIS — R69 Illness, unspecified: Secondary | ICD-10-CM | POA: Diagnosis not present

## 2021-05-05 DIAGNOSIS — F411 Generalized anxiety disorder: Secondary | ICD-10-CM | POA: Diagnosis not present

## 2021-05-05 MED ORDER — CITALOPRAM HYDROBROMIDE 20 MG PO TABS: 20.0000 mg | ORAL_TABLET | Freq: Every day | ORAL | 0 refills | Status: DC

## 2021-05-05 MED ORDER — BENZTROPINE MESYLATE 0.5 MG PO TABS: 0.5000 mg | ORAL_TABLET | Freq: Every day | ORAL | 0 refills | Status: DC

## 2021-05-05 MED ORDER — TEMAZEPAM 30 MG PO CAPS: 30.0000 mg | ORAL_CAPSULE | Freq: Every day | ORAL | 0 refills | Status: DC

## 2021-05-05 MED ORDER — ARIPIPRAZOLE ER 400 MG IM PRSY: 400.0000 mg | PREFILLED_SYRINGE | INTRAMUSCULAR | 2 refills | Status: DC

## 2021-05-05 NOTE — Progress Notes (Signed)
Virtual Visit via Telephone Note  I connected with Sean Hill on 05/05/21 at  2:40 PM EDT by telephone and verified that I am speaking with the correct person using two identifiers.  Location: Patient: Home Provider: Home Office   I discussed the limitations, risks, security and privacy concerns of performing an evaluation and management service by telephone and the availability of in person appointments. I also discussed with the patient that there may be a patient responsible charge related to this service. The patient expressed understanding and agreed to proceed.   History of Present Illness: Patient is evaluated by phone session.  On the last visit to be cut down his trazodone and reduce the Cogentin as he was complaining of constipation and dry mouth.  He noticed much improvement and he does not notice as such dry mouth and his tremors are also much better.  He is sleeping good with the help of the temazepam.  He denies any mania, psychosis, hallucination.  He is getting Abilify injection once a month which he feels helping his mood and his symptoms are manageable.  He denies any suicidal thoughts or homicidal thoughts.  He is anxious about his knee joint as may require surgery in October for his right knee joint.  Patient had joint replacement surgery in 2010 but he was told it may have been loose and may require surgery.  He admitted weight gain because not able to walk as much but hoping to start his diet plan and after the surgery his plan is to regular walking.  He lives with his wife who is very supportive.  He is in therapy with [name] once a month.  Patient has a blood work done recently and his hemoglobin A1c is 6.0.  His BUN is 9 and creatinine 0.84.  His cholesterol is 200.  Past Psychiatric History:  H/O multiple hospitalization.  Last inpatient in July 2014. H/O overdose on Latuda with alcohol. H/O cutting wrist.  Took Tegretol, Depakote, Ambien, Remeron, Vistaril, Geodon,  Abilify, lithium, Provigil, Zoloft, Neurontin, Lamictal, Wellbutrin, Risperdal and Pristiq. H/O mania, aggression, getting speeding tickets, excessive buying and impulsive behavior.  Recent Results (from the past 2160 hour(s))  Hep C Antibody     Status: None   Collection Time: 04/29/21 11:06 AM  Result Value Ref Range   Hepatitis C Ab NON-REACTIVE NON-REACTIVE   SIGNAL TO CUT-OFF 0.01 <1.00    Comment: . HCV antibody was non-reactive. There is no laboratory  evidence of HCV infection. . In most cases, no further action is required. However, if recent HCV exposure is suspected, a test for HCV RNA (test code [id number]) is suggested. . For additional information please refer to http://education.questdiagn[facility].com/faq/FAQ22v1 (This link is being provided for informational/ educational purposes only.) .   PSA     Status: None   Collection Time: 04/29/21 11:06 AM  Result Value Ref Range   PSA 0.28 0.10 - 4.00 ng/mL    Comment: Test performed using Access Hybritech PSA Assay, a parmagnetic partical, chemiluminecent immunoassay.  TSH     Status: None   Collection Time: 04/29/21 11:06 AM  Result Value Ref Range   TSH 1.90 0.35 - 5.50 uIU/mL  Lipid panel     Status: Abnormal   Collection Time: 04/29/21 11:06 AM  Result Value Ref Range   Cholesterol 200 0 - 200 mg/dL    Comment: ATP III Classification       Desirable:  < 200 mg/dL  Borderline High:  200 - 239 mg/dL          High:  > = 240 mg/dL   Triglycerides (H) 0.0 - 149.0 mg/dL    742.0 Triglyceride is over 400; calculations on Lipids are invalid.    Comment: Normal:  <150 mg/dLBorderline High:  150 - 199 mg/dL   HDL 34.60 (L) >39.00 mg/dL   Total CHOL/HDL Ratio 6     Comment:                Men          Women1/2 Average Risk     3.4          3.3Average Risk          5.0          4.42X Average Risk          9.6          7.13X Average Risk          15.0          11.0                      Comprehensive metabolic panel      Status: None   Collection Time: 04/29/21 11:06 AM  Result Value Ref Range   Sodium 140 135 - 145 mEq/L   Potassium 4.3 3.5 - 5.1 mEq/L   Chloride 103 96 - 112 mEq/L   CO2 27 19 - 32 mEq/L   Glucose, Bld 95 70 - 99 mg/dL   BUN 9 6 - 23 mg/dL   Creatinine, Ser 0.84 0.40 - 1.50 mg/dL   Total Bilirubin 0.7 0.2 - 1.2 mg/dL   Alkaline Phosphatase 91 39 - 117 U/L   AST 35 0 - 37 U/L   ALT 31 0 - 53 U/L   Total Protein 6.6 6.0 - 8.3 g/dL   Albumin 4.1 3.5 - 5.2 g/dL   GFR 83.60 >60.00 mL/min    Comment: Calculated using the CKD-EPI Creatinine Equation (2021)   Calcium 9.7 8.4 - 10.5 mg/dL  Hemoglobin A1c     Status: None   Collection Time: 04/29/21 11:06 AM  Result Value Ref Range   Hgb A1c MFr Bld 6.0 4.6 - 6.5 %    Comment: Glycemic Control Guidelines for People with Diabetes:Non Diabetic:  <6%Goal of Therapy: <7%Additional Action Suggested:  >8%   CBC with Differential/Platelet     Status: None   Collection Time: 04/29/21 11:06 AM  Result Value Ref Range   WBC 4.7 4.0 - 10.5 K/uL   RBC 5.31 4.22 - 5.81 Mil/uL   Hemoglobin 15.6 13.0 - 17.0 g/dL   HCT 46.1 39.0 - 52.0 %   MCV 86.8 78.0 - 100.0 fl   MCHC 33.9 30.0 - 36.0 g/dL   RDW 14.2 11.5 - 15.5 %   Platelets 209.0 150.0 - 400.0 K/uL   Neutrophils Relative % 49.5 43.0 - 77.0 %   Lymphocytes Relative 39.3 12.0 - 46.0 %   Monocytes Relative 8.7 3.0 - 12.0 %   Eosinophils Relative 2.0 0.0 - 5.0 %   Basophils Relative 0.5 0.0 - 3.0 %   Neutro Abs 2.3 1.4 - 7.7 K/uL   Lymphs Abs 1.8 0.7 - 4.0 K/uL   Monocytes Absolute 0.4 0.1 - 1.0 K/uL   Eosinophils Absolute 0.1 0.0 - 0.7 K/uL   Basophils Absolute 0.0 0.0 - 0.1 K/uL  LDL cholesterol, direct     Status:  None   Collection Time: 04/29/21 11:06 AM  Result Value Ref Range   Direct LDL 65.0 mg/dL    Comment: Optimal:  <100 mg/dLNear or Above Optimal:  100-129 mg/dLBorderline High:  130-159 mg/dLHigh:  160-189 mg/dLVery High:  >190 mg/dL     Psychiatric Specialty Exam: Physical  Exam  Review of Systems  Weight 266 lb (120.7 kg).Body mass index is 44.26 kg/m.  General Appearance: NA  Eye Contact:  NA  Speech:  Slow  Volume:  Decreased  Mood:  Euthymic  Affect:  NA  Thought Process:  Goal Directed  Orientation:  Full (Time, Place, and Person)  Thought Content:  WDL  Suicidal Thoughts:  No  Homicidal Thoughts:  No  Memory:  Immediate;   Fair Recent;   Fair Remote;   Fair  Judgement:  Intact  Insight:  Present  Psychomotor Activity:  NA  Concentration:  Concentration: Fair and Attention Span: Fair  Recall:  [employer] of Knowledge:  Fair  Language:  Good  Akathisia:  No  Handed:  Right  AIMS (if indicated):     Assets:  Communication Skills Desire for Improvement Housing Social Support Transportation  ADL's:  Intact  Cognition:  WNL  Sleep:   ok      Assessment and Plan: Bipolar disorder type I.  Generalized anxiety disorder.  I reviewed blood work results.  His BUN and creatinine is normal.  His hemoglobin A1c is 6.  Discussed current medication.  His dry mouth and constipation is much better since we discontinue trazodone and cut down the Cogentin.  Continue Abilify injection 400 mg intramuscular every 4 weeks, Cogentin 0.5 mg at bedtime, temazepam 30 mg at bedtime and Celexa 20 mg daily.  Recommended to call us back if there is any question or any concern.  Patient will continue therapy appointment with [name] once a month.  Follow up in 3 months  Follow Up Instructions:    I discussed the assessment and treatment plan with the patient. The patient was provided an opportunity to ask questions and all were answered. The patient agreed with the plan and demonstrated an understanding of the instructions.   The patient was advised to call back or seek an in-person evaluation if the symptoms worsen or if the condition fails to improve as anticipated.  I provided 19 minutes of non-face-to-face time during this encounter.   [name], MD

## 2021-05-10 ENCOUNTER — Other Ambulatory Visit: Payer: Self-pay | Admitting: Gastroenterology

## 2021-05-10 ENCOUNTER — Other Ambulatory Visit (HOSPITAL_COMMUNITY): Payer: Self-pay | Admitting: Psychiatry

## 2021-05-10 DIAGNOSIS — F319 Bipolar disorder, unspecified: Secondary | ICD-10-CM

## 2021-05-10 NOTE — Progress Notes (Signed)
Call to patient per Jeni Salles to advise his fenofibrate was added to his breakfast packaging with [facility]. Patient was made aware and thanked me for the update. [place] Clinical Pharmacist Assistant [phone]

## 2021-05-11 ENCOUNTER — Telehealth: Payer: Self-pay | Admitting: Pharmacist

## 2021-05-11 DIAGNOSIS — R69 Illness, unspecified: Secondary | ICD-10-CM | POA: Diagnosis not present

## 2021-05-11 NOTE — Progress Notes (Signed)
    Chronic Care Management Pharmacy Assistant   Name: KALLEB HEMPLE  MRN: [id number] DOB: [birth year]   Reason for Encounter: Medication from Pharmacy Call   Got message from [place] that fills for Protonix 40 mg were denied, per pharmacy patient needs to follow up with Dr Gerrit Heck before this medication can be filled.   Call to patient to make him aware and advised him to give his office a call and schedule an appointment and that that medication will not be included in upcoming delivery of packaging as planned. Patient in agreement.  [place] Clinical Pharmacist Assistant [phone]    Medications: Outpatient Encounter Medications as of 05/11/2021  Medication Sig   albuterol (VENTOLIN HFA) 108 (90 Base) MCG/ACT inhaler Inhale 2 puffs into the lungs every 6 (six) hours as needed for wheezing or shortness of breath. (Patient not taking: Reported on 03/11/2021)   ARIPiprazole ER (ABILIFY MAINTENA) 400 MG PRSY prefilled syringe Inject 400 mg into the muscle every 28 (twenty-eight) days.   benztropine (COGENTIN) 0.5 MG tablet Take 1 tablet (0.5 mg total) by mouth daily.   citalopram (CELEXA) 20 MG tablet Take 1 tablet (20 mg total) by mouth daily.   ezetimibe (ZETIA) 10 MG tablet TAKE ONE TABLET BY MOUTH AT NOON   fenofibrate (TRICOR) 145 MG tablet Take 1 tablet (145 mg total) by mouth daily.   losartan (COZAAR) 50 MG tablet TAKE ONE TABLET BY MOUTH EVERY MORNING   Multiple Vitamin (MULTIVITAMIN WITH MINERALS) TABS tablet Take 1 tablet by mouth daily. (Patient not taking: Reported on 02/02/2021)   Multiple Vitamins-Minerals (PRESERVISION AREDS 2 PO) Take 1 capsule by mouth 2 (two) times daily.   Omega-3 Fatty Acids (FISH OIL PO) Take 1 capsule by mouth in the morning and at bedtime.   pantoprazole (PROTONIX) 40 MG tablet Take 1 tablet (40 mg total) by mouth 2 (two) times daily. Please call [phone] to schedule an office visit for more refills   temazepam  (RESTORIL) 30 MG capsule Take 1 capsule (30 mg total) by mouth at bedtime.   Vitamin D, Cholecalciferol, 1000 UNITS TABS Take 1,000 Units by mouth daily.    Facility-Administered Encounter Medications as of 05/11/2021  Medication   ARIPiprazole ER (ABILIFY MAINTENA) 400 MG prefilled syringe 400 mg    Care Gaps: Zoster Vaccine - Overdue Flu Vaccine - Overdue CCM F/U Call - 07-09-21 at 12 AWV - Done 09-25-2020  [facility]: Losartan (Cozaar) 50 mg - Last filled 02-11-2021 90 DS at [place] Pharmacist Assistant [phone]

## 2021-05-12 ENCOUNTER — Telehealth: Payer: Self-pay | Admitting: Pharmacist

## 2021-05-12 ENCOUNTER — Telehealth (HOSPITAL_COMMUNITY): Payer: Self-pay | Admitting: *Deleted

## 2021-05-12 DIAGNOSIS — F319 Bipolar disorder, unspecified: Secondary | ICD-10-CM

## 2021-05-12 MED ORDER — TEMAZEPAM 30 MG PO CAPS: 30.0000 mg | ORAL_CAPSULE | Freq: Every day | ORAL | 0 refills | Status: DC

## 2021-05-12 NOTE — Telephone Encounter (Signed)
[facility] called stating came in with a hard copy script for the Restoril and they require controls to be e-scribed. Nurse offered to give a verbal but was told it has to be e-scribed by provider. Please review.

## 2021-05-12 NOTE — Telephone Encounter (Signed)
Send electronic script.

## 2021-05-12 NOTE — Progress Notes (Signed)
    Chronic Care Management Pharmacy Assistant   Name: Sean Hill  MRN: [id number] DOB: [birth year]  Received notice from [facility] that Restoril would not be included in patients packaging due to no script on file. Per chart notes he was given a hard copy of a script on 05-05-21 for the medication 30 DS from Dr Marchia Bond Call to the office at [phone] spoke to his nurse Sharyn Lull she will have it sent over to [facility] ASAP Updated [facility] pharmacy to be on lookout for it.    Medications: Outpatient Encounter Medications as of 05/12/2021  Medication Sig   albuterol (VENTOLIN HFA) 108 (90 Base) MCG/ACT inhaler Inhale 2 puffs into the lungs every 6 (six) hours as needed for wheezing or shortness of breath. (Patient not taking: Reported on 03/11/2021)   ARIPiprazole ER (ABILIFY MAINTENA) 400 MG PRSY prefilled syringe Inject 400 mg into the muscle every 28 (twenty-eight) days.   benztropine (COGENTIN) 0.5 MG tablet Take 1 tablet (0.5 mg total) by mouth daily.   citalopram (CELEXA) 20 MG tablet Take 1 tablet (20 mg total) by mouth daily.   ezetimibe (ZETIA) 10 MG tablet TAKE ONE TABLET BY MOUTH AT NOON   fenofibrate (TRICOR) 145 MG tablet Take 1 tablet (145 mg total) by mouth daily.   losartan (COZAAR) 50 MG tablet TAKE ONE TABLET BY MOUTH EVERY MORNING   Multiple Vitamin (MULTIVITAMIN WITH MINERALS) TABS tablet Take 1 tablet by mouth daily. (Patient not taking: Reported on 02/02/2021)   Multiple Vitamins-Minerals (PRESERVISION AREDS 2 PO) Take 1 capsule by mouth 2 (two) times daily.   Omega-3 Fatty Acids (FISH OIL PO) Take 1 capsule by mouth in the morning and at bedtime.   pantoprazole (PROTONIX) 40 MG tablet Take 1 tablet (40 mg total) by mouth 2 (two) times daily. Please call [phone] to schedule an office visit for more refills   temazepam (RESTORIL) 30 MG capsule Take 1 capsule (30 mg total) by mouth at bedtime.   Vitamin D, Cholecalciferol, 1000 UNITS TABS Take 1,000 Units  by mouth daily.    Facility-Administered Encounter Medications as of 05/12/2021  Medication   ARIPiprazole ER (ABILIFY MAINTENA) 400 MG prefilled syringe 400 mg    Care Gaps: Zoster Vaccine - Overdue Flu Vaccine - Overdue CCM F/U Call - 07-09-21 at 12 AWV - Done 09-25-2020  [facility] Drugs: Losartan (Cozaar) 50 mg - Last filled 02-11-2021 90 DS at [place] Pharmacist Assistant [phone]

## 2021-05-13 ENCOUNTER — Encounter: Payer: Self-pay | Admitting: Adult Health

## 2021-05-14 ENCOUNTER — Telehealth: Payer: Self-pay | Admitting: *Deleted

## 2021-05-14 NOTE — Telephone Encounter (Signed)
Dr Bryan Lemma,  This pt is scheduled for a colon 9-19 with you for a hx of colon polyps- in 2021 he had 3 colons at [facility]-   08-2019 he had a [place] colon with 12 polyps and a mass- repeat at [facility] 09-2019 for EMR-   10-09-2019 he had a colon for  a bleeding polypectomy site and another colon 04-29-2020 at [facility]- I am assuming for EMR use again.   He is now scheduled for the [place]- he is 80 years old- there is no Recall Assessment sheet in Epic- you stated in the report 04-2020 follow up in [facility] clinic in 3 months, and there is no OV following colon. I just wanted to be sure he is ok for [facility] , and that he does not need an OV prior.  Please advise and thanks for your time Marijean Niemann

## 2021-05-14 NOTE — Telephone Encounter (Signed)
I also found this note in his surgeries that state they had to use a glidescope at a 2017 intubation due to large tongue, missing teeth- see below-  does this make him a [facility] case anyway ?   Please advise  thanks        Procedure Name: Intubation Performed by: CRAFT, JANET W Pre-anesthesia Checklist: Patient identified, Emergency Drugs available, Suction available and Patient being monitored Patient Re-evaluated:Patient Re-evaluated prior to inductionOxygen Delivery Method: Circle System Utilized Preoxygenation: Pre-oxygenation with 100% oxygen Intubation Type: IV induction Ventilation: Mask ventilation without difficulty and Nasal airway inserted- appropriate to patient size Laryngoscope Size: Mac, 3, Glidescope and 4 (unable to visualize cord with MAC blade, easy intubation with Glide scope ) Grade View: Grade II Tube type: Oral Tube size: 8.0 mm Number of attempts: 2 (full beard, missing teeth, large tongue) Airway Equipment and Method: Stylet,  Oral airway and Video-laryngoscopy Placement Confirmation: ETT inserted through vocal cords under direct vision,  positive ETCO2 and breath sounds checked- equal and bilateral Secured at: 23 cm Tube secured with: Tape Dental Injury: Teeth and Oropharynx as per pre-operative assessment

## 2021-05-14 NOTE — Telephone Encounter (Signed)
[place],  Can you please schedule pt as a direct hsopital case per Dr Bryan Lemma - hx colon polyps- Difficult intubation  thanks   Marijean Niemann   He has a PV Monday 8-29

## 2021-05-14 NOTE — Telephone Encounter (Signed)
Dr Bryan Lemma,  Can he be a direct scheduled at [facility] or does he need an OV ?  Lelan Pons PV

## 2021-05-17 ENCOUNTER — Ambulatory Visit (AMBULATORY_SURGERY_CENTER): Payer: Medicare HMO | Admitting: *Deleted

## 2021-05-17 ENCOUNTER — Other Ambulatory Visit: Payer: Self-pay

## 2021-05-17 VITALS — Ht 69.0 in | Wt 260.0 lb

## 2021-05-17 DIAGNOSIS — Z86010 Personal history of colonic polyps: Secondary | ICD-10-CM

## 2021-05-17 NOTE — Progress Notes (Signed)
  Virtual pre-visit conducted over telephone. Advised patient his colonoscopy was going to be scheduled @ [facility], date and time to be determined. Patient will pick up instructions once date is determined. Patient requesting sample due to financial hardship, will also leave a Clenpiq  sample for patient on 3rd floor reception desk.   No egg or soy allergy known to patient  No issues with past sedation with any surgeries or procedures Patient denies ever being told they had issues or difficulty with intubation  No FH of Malignant Hyperthermia No diet pills per patient No home 02 use per patient  No blood thinners per patient  Pt denies issues with constipation  No A fib or A flutter  EMMI video to pt or via [place] 19 guidelines implemented in [place] today with Pt and RN   Pt is fully vaccinated  for Covid    Due to the COVID-19 pandemic we are asking patients to follow certain guidelines.  Pt aware of COVID protocols and [facility] guidelines

## 2021-05-18 ENCOUNTER — Telehealth: Payer: Self-pay | Admitting: Gastroenterology

## 2021-05-18 ENCOUNTER — Encounter (HOSPITAL_COMMUNITY): Payer: Self-pay | Admitting: *Deleted

## 2021-05-18 ENCOUNTER — Ambulatory Visit (INDEPENDENT_AMBULATORY_CARE_PROVIDER_SITE_OTHER): Payer: Medicare HMO | Admitting: *Deleted

## 2021-05-18 VITALS — BP 134/74 | HR 95 | Temp 96.8°F | Resp 22 | Ht 68.0 in | Wt 266.2 lb

## 2021-05-18 DIAGNOSIS — F319 Bipolar disorder, unspecified: Secondary | ICD-10-CM

## 2021-05-18 DIAGNOSIS — R69 Illness, unspecified: Secondary | ICD-10-CM | POA: Diagnosis not present

## 2021-05-18 MED ORDER — PANTOPRAZOLE SODIUM 40 MG PO TBEC: 40.0000 mg | DELAYED_RELEASE_TABLET | Freq: Two times a day (BID) | ORAL | 1 refills | Status: DC

## 2021-05-18 NOTE — Telephone Encounter (Signed)
Inbound call from patient wife requesting med refill for protonix [facility]. [phone]

## 2021-05-18 NOTE — Telephone Encounter (Signed)
Done

## 2021-05-18 NOTE — Progress Notes (Signed)
Pt in clinic today for due Abilify Maintena 400 mg injection. Pt pleasant and cooperative on approach. Injection prepared as ordered and given in left upper outer quadrant without complaint. Pt states that "I waited too long to get my shot this time" as he has been experiencing depression for the past week. Pt last injection given by this nurse on 04/15/21. Pt to return in 4 weeks for next due injection.

## 2021-05-19 ENCOUNTER — Other Ambulatory Visit: Payer: Self-pay

## 2021-05-19 DIAGNOSIS — Z86010 Personal history of colonic polyps: Secondary | ICD-10-CM

## 2021-05-19 NOTE — Telephone Encounter (Signed)
Pre visit completed.  Patient awaiting information for date at time at [facility] Please advise. Thanks

## 2021-05-19 NOTE — Telephone Encounter (Signed)
Patient has been scheduled for his colonoscopy at [facility] on Wednesday, 06/30/21 at 1 PM with Dr. Bryan Lemma. Thanks

## 2021-05-19 NOTE — Telephone Encounter (Signed)
Pre-visit complete. Patient awaiting date and time information for [facility]

## 2021-05-23 ENCOUNTER — Encounter (HOSPITAL_COMMUNITY): Payer: Self-pay | Admitting: Emergency Medicine

## 2021-05-23 ENCOUNTER — Other Ambulatory Visit: Payer: Self-pay

## 2021-05-23 ENCOUNTER — Emergency Department (HOSPITAL_COMMUNITY): Payer: Medicare HMO

## 2021-05-23 ENCOUNTER — Emergency Department (HOSPITAL_COMMUNITY)
Admission: EM | Admit: 2021-05-23 | Discharge: 2021-05-23 | Disposition: A | Discharge status: Home / Self Care | Payer: Medicare HMO | Attending: Emergency Medicine | Admitting: Emergency Medicine

## 2021-05-23 DIAGNOSIS — W19XXXA Unspecified fall, initial encounter: Secondary | ICD-10-CM | POA: Diagnosis not present

## 2021-05-23 DIAGNOSIS — Z79899 Other long term (current) drug therapy: Secondary | ICD-10-CM | POA: Diagnosis not present

## 2021-05-23 DIAGNOSIS — J9811 Atelectasis: Secondary | ICD-10-CM | POA: Diagnosis not present

## 2021-05-23 DIAGNOSIS — R42 Dizziness and giddiness: Secondary | ICD-10-CM

## 2021-05-23 DIAGNOSIS — S42292A Other displaced fracture of upper end of left humerus, initial encounter for closed fracture: Secondary | ICD-10-CM | POA: Diagnosis not present

## 2021-05-23 DIAGNOSIS — J45909 Unspecified asthma, uncomplicated: Secondary | ICD-10-CM | POA: Diagnosis not present

## 2021-05-23 DIAGNOSIS — S42202A Unspecified fracture of upper end of left humerus, initial encounter for closed fracture: Secondary | ICD-10-CM

## 2021-05-23 DIAGNOSIS — I10 Essential (primary) hypertension: Secondary | ICD-10-CM | POA: Diagnosis not present

## 2021-05-23 DIAGNOSIS — R110 Nausea: Secondary | ICD-10-CM | POA: Diagnosis not present

## 2021-05-23 DIAGNOSIS — S4992XA Unspecified injury of left shoulder and upper arm, initial encounter: Secondary | ICD-10-CM | POA: Diagnosis not present

## 2021-05-23 DIAGNOSIS — Z96653 Presence of artificial knee joint, bilateral: Secondary | ICD-10-CM | POA: Insufficient documentation

## 2021-05-23 DIAGNOSIS — R55 Syncope and collapse: Secondary | ICD-10-CM | POA: Diagnosis not present

## 2021-05-23 DIAGNOSIS — S42212A Unspecified displaced fracture of surgical neck of left humerus, initial encounter for closed fracture: Secondary | ICD-10-CM | POA: Diagnosis not present

## 2021-05-23 DIAGNOSIS — M25512 Pain in left shoulder: Secondary | ICD-10-CM | POA: Diagnosis not present

## 2021-05-23 DIAGNOSIS — W1830XA Fall on same level, unspecified, initial encounter: Secondary | ICD-10-CM | POA: Insufficient documentation

## 2021-05-23 DIAGNOSIS — I447 Left bundle-branch block, unspecified: Secondary | ICD-10-CM | POA: Diagnosis not present

## 2021-05-23 DIAGNOSIS — M19012 Primary osteoarthritis, left shoulder: Secondary | ICD-10-CM | POA: Diagnosis not present

## 2021-05-23 LAB — CBC WITH DIFFERENTIAL/PLATELET
Abs Immature Granulocytes: 0.04 10*3/uL (ref 0.00–0.07)
Basophils Absolute: 0 10*3/uL (ref 0.0–0.1)
Basophils Relative: 0 %
Eosinophils Absolute: 0 10*3/uL (ref 0.0–0.5)
Eosinophils Relative: 1 %
HCT: 44.7 % (ref 39.0–52.0)
Hemoglobin: 15.4 g/dL (ref 13.0–17.0)
Immature Granulocytes: 1 %
Lymphocytes Relative: 18 %
Lymphs Abs: 1.3 10*3/uL (ref 0.7–4.0)
MCH: 30 pg (ref 26.0–34.0)
MCHC: 34.5 g/dL (ref 30.0–36.0)
MCV: 87 fL (ref 80.0–100.0)
Monocytes Absolute: 0.6 10*3/uL (ref 0.1–1.0)
Monocytes Relative: 8 %
Neutro Abs: 5.3 10*3/uL (ref 1.7–7.7)
Neutrophils Relative %: 72 %
Platelets: 215 10*3/uL (ref 150–400)
RBC: 5.14 MIL/uL (ref 4.22–5.81)
RDW: 14.1 % (ref 11.5–15.5)
WBC: 7.2 10*3/uL (ref 4.0–10.5)
nRBC: 0 % (ref 0.0–0.2)

## 2021-05-23 LAB — BASIC METABOLIC PANEL
Anion gap: 10 (ref 5–15)
BUN: 12 mg/dL (ref 8–23)
CO2: 25 mmol/L (ref 22–32)
Calcium: 9.5 mg/dL (ref 8.9–10.3)
Chloride: 107 mmol/L (ref 98–111)
Creatinine, Ser: 0.88 mg/dL (ref 0.61–1.24)
GFR, Estimated: >60 mL/min (ref >60)
Glucose, Bld: 180 mg/dL — ABNORMAL HIGH (ref 70–99)
Potassium: 3.7 mmol/L (ref 3.5–5.1)
Sodium: 142 mmol/L (ref 135–145)

## 2021-05-23 LAB — CBG MONITORING, ED: Glucose-Capillary: 176 mg/dL — ABNORMAL HIGH (ref 70–99)

## 2021-05-23 LAB — TROPONIN I (HIGH SENSITIVITY): Troponin I (High Sensitivity): 5 ng/L (ref <18)

## 2021-05-23 MED ORDER — MORPHINE SULFATE (PF) 4 MG/ML IV SOLN
4.0000 mg | Freq: Once | INTRAVENOUS | Status: AC
  Administered 2021-05-23: 4 mg via INTRAVENOUS
  Filled 2021-05-23: qty 1

## 2021-05-23 MED ORDER — HYDROCODONE-ACETAMINOPHEN 5-325 MG PO TABS: 1.0000 | ORAL_TABLET | Freq: Four times a day (QID) | ORAL | 0 refills | Status: DC | PRN

## 2021-05-23 MED ORDER — HYDROCODONE-ACETAMINOPHEN 5-325 MG PO TABS
1.0000 | ORAL_TABLET | Freq: Four times a day (QID) | ORAL | 0 refills | Status: DC | PRN
Start: 2021-05-23 — End: 2021-07-09

## 2021-05-23 MED ORDER — MECLIZINE HCL 25 MG PO TABS: 25.0000 mg | ORAL_TABLET | Freq: Three times a day (TID) | ORAL | 0 refills | Status: DC | PRN

## 2021-05-23 MED ORDER — SODIUM CHLORIDE 0.9 % IV SOLN: INTRAVENOUS | Status: DC

## 2021-05-23 MED ORDER — ONDANSETRON HCL 4 MG/2ML IJ SOLN
4.0000 mg | Freq: Once | INTRAMUSCULAR | Status: AC
  Administered 2021-05-23: 4 mg via INTRAVENOUS
  Filled 2021-05-23: qty 2

## 2021-05-23 NOTE — Discharge Instructions (Addendum)
Take the medications as needed for dizziness.  Take the pain medications to help with your shoulder injury.  Wear the sling for comfort.  Make sure to get up and walk slowly as you are recovering from the vertigo.  Follow-up with your doctor next week to make sure that is improving.  Follow-up with your orthopedic doctor for evaluation of your shoulder injury

## 2021-05-23 NOTE — ED Triage Notes (Signed)
Pt BIBA. Per EMS pt coming from home with hx of vertigo, Pt c/o dizziness and fell yesterday and today. When pt fell today he sustained an injury to his left shoulder. Pt placed in sling.   20 G right AC 4 mg Zofran 137/70 80 99% room air CBG 148

## 2021-05-23 NOTE — ED Notes (Signed)
Patient currently in CT °

## 2021-05-23 NOTE — ED Provider Notes (Signed)
[facility] DEPT Provider Note   CSN: [id number] Arrival date & time: 05/23/21  1716     History Chief Complaint  Patient presents with   Dizziness   Fall    Sean Hill is a 80 y.o. male.   Dizziness Fall   Patient states for the last few days he has been having some issues with vertigo.  He has been having intermittent episodes of dizziness where the room is spinning.  He has not had any trouble with his vision or speech.  No difficulties with fevers or chills.  Vomiting or diarrhea.  Chest pain or shortness of breath.  Patient had another episode today where he fell although does not think he passed out.  He did injure his shoulder and is now having significant pain in the upper part of his left shoulder  Past Medical History:  Diagnosis Date   Anal fissure    Anxiety    Arthritis    right wrist; pt. states "everywhere"   Asthma    Bipolar disorder ([place])    Cataract    Depression    GERD (gastroesophageal reflux disease)    Hemorrhage of colon following colonoscopy 10/08/2019   History of kidney stones    History of MRSA infection    left knee after arthroplasty   Hyperlipidemia    Hypertension    states under control with med., has been on med. x 1 yr.   Impaired hearing    Left bundle branch block (LBBB) on electrocardiogram 10/29/2015   Macular degeneration (senile) of retina    left   Wears glasses     Patient Active Problem List   Diagnosis Date Noted   Bipolar affective disorder, currently depressed, mild ([place]) 10/21/2019   Hemorrhage of colon following colonoscopy 10/08/2019   Adenomatous polyp of ascending colon    Tubulovillous adenoma of colon    History of colonic polyps    Diverticulosis of colon without hemorrhage    Grade I internal hemorrhoids    Essential hypertension 08/23/2016   Left bundle branch block (LBBB) on electrocardiogram 06/10/2016   Hyperlipidemia 06/07/2016   Left leg swelling 01/06/2015    Change in bowel habits 01/06/2015   Gastroesophageal reflux disease 11/28/2011   Bipolar disorder ([place]) 11/25/2011    Past Surgical History:  Procedure Laterality Date   ANKLE FUSION Bilateral    ANKLE SURGERY Bilateral    ligament surgery   BIOPSY  04/29/2020   Procedure: BIOPSY;  Surgeon: Lavena Bullion, DO;  Location: [facility];  Service: Gastroenterology;;   CARDIAC CATHETERIZATION  x 2   1983; 11/14/2003   CARPOMETACARPEL SUSPENSION PLASTY Right 09/22/2016   Procedure: right thumb carpometacarpal  ARTHROPLASTY;  Surgeon: Leanora Cover, MD;  Location: [facility];  Service: Orthopedics;  Laterality: Right;  right thumb carpometacarpal  ARTHROPLASTY   CARPOMETACARPEL SUSPENSION PLASTY Right 11/02/2017   Procedure: RIGHT THUMB SUSPENSIONPLASTY WITH TIGHTROPE, DISTAL POLE SCAPHOID  EXCISION;  Surgeon: Leanora Cover, MD;  Location: [facility];  Service: Orthopedics;  Laterality: Right;   COLONOSCOPY WITH PROPOFOL N/A 10/02/2019   Procedure: COLONOSCOPY WITH PROPOFOL;  Surgeon: Lavena Bullion, DO;  Location: [facility];  Service: Gastroenterology;  Laterality: N/A;   COLONOSCOPY WITH PROPOFOL N/A 10/09/2019   Procedure: COLONOSCOPY WITH PROPOFOL;  Surgeon: Gatha Mayer, MD;  Location: [facility];  Service: Endoscopy;  Laterality: N/A;   COLONOSCOPY WITH PROPOFOL N/A 04/29/2020   Procedure: COLONOSCOPY WITH PROPOFOL;  Surgeon: Bryan Lemma,  Dominic Pea, DO;  Location: [facility];  Service: Gastroenterology;  Laterality: N/A;   ELBOW SURGERY Left    ENDOSCOPIC MUCOSAL RESECTION N/A 10/02/2019   Procedure: ENDOSCOPIC MUCOSAL RESECTION;  Surgeon: Lavena Bullion, DO;  Location: [facility];  Service: Gastroenterology;  Laterality: N/A;   HEMOSTASIS CLIP PLACEMENT  10/09/2019   Procedure: HEMOSTASIS CLIP PLACEMENT;  Surgeon: Gatha Mayer, MD;  Location: [facility];  Service: Endoscopy;;   INGUINAL HERNIA REPAIR Right    OPEN REDUCTION INTERNAL FIXATION  (ORIF) DISTAL RADIAL FRACTURE Right 10/29/2015   Procedure: OPEN REDUCTION INTERNAL FIXATION (ORIF) DISTAL RADIAL FRACTURE;  Surgeon: Leanora Cover, MD;  Location: [place];  Service: Orthopedics;  Laterality: Right;   ORIF ELBOW FRACTURE Right    POLYPECTOMY  04/29/2020   Procedure: POLYPECTOMY;  Surgeon: Lavena Bullion, DO;  Location: [facility];  Service: Gastroenterology;;   SUBMUCOSAL LIFTING INJECTION  10/02/2019   Procedure: SUBMUCOSAL LIFTING INJECTION;  Surgeon: Lavena Bullion, DO;  Location: [facility];  Service: Gastroenterology;;   TONSILLECTOMY     TOTAL KNEE ARTHROPLASTY Bilateral    ULNAR COLLATERAL LIGAMENT REPAIR Right 12/29/2015   Procedure: REPAIR  RIGHT LATERAL ULNAR COLLATERAL LIGAMENT TEAR  EXTENSOR ORIGIN ;  Surgeon: Leanora Cover, MD;  Location: [facility];  Service: Orthopedics;  Laterality: Right;   WRIST ARTHROSCOPY WITH DEBRIDEMENT Right 07/14/2016   Procedure: RIGHT WRIST ARTHROSCOPY WITH DEBRIDEMENT  TRIANGULAR FIBROCARTILAGE COMPLEX;  Surgeon: Leanora Cover, MD;  Location: [facility];  Service: Orthopedics;  Laterality: Right;       Family History  Problem Relation Age of Onset   Alcohol abuse Mother    Ovarian cancer Mother    Alcohol abuse Father    Pancreatic cancer Father    Hyperlipidemia Brother    Barrett's esophagus Brother    Colon cancer Maternal Grandmother    Depression Daughter    Paranoid behavior Daughter    Esophageal cancer Neg Hx    Stomach cancer Neg Hx    Rectal cancer Neg Hx    Colon polyps Neg Hx     Social History   Tobacco Use   Smoking status: Never   Smokeless tobacco: Never  Vaping Use   Vaping Use: Never used  Substance Use Topics   Alcohol use: Not Currently   Drug use: No    Home Medications Prior to Admission medications   Medication Sig Start Date End Date Taking? Authorizing Provider  HYDROcodone-acetaminophen (NORCO/VICODIN) 5-325 MG tablet Take 1 tablet by  mouth every 6 (six) hours as needed. 05/23/21  Yes Dorie Rank, MD  meclizine (ANTIVERT) 25 MG tablet Take 1 tablet (25 mg total) by mouth 3 (three) times daily as needed for dizziness. 05/23/21  Yes Dorie Rank, MD  albuterol (VENTOLIN HFA) 108 (90 Base) MCG/ACT inhaler Inhale 2 puffs into the lungs every 6 (six) hours as needed for wheezing or shortness of breath. Patient not taking: No sig reported 12/29/20   [name], NP  ARIPiprazole ER (ABILIFY MAINTENA) 400 MG PRSY prefilled syringe Inject 400 mg into the muscle every 28 (twenty-eight) days. 05/05/21 05/05/22  Arfeen, Arlyce Harman, MD  benztropine (COGENTIN) 0.5 MG tablet Take 1 tablet (0.5 mg total) by mouth daily. 05/05/21   Arfeen, Arlyce Harman, MD  citalopram (CELEXA) 20 MG tablet Take 1 tablet (20 mg total) by mouth daily. 05/05/21 05/05/22  Arfeen, Arlyce Harman, MD  ezetimibe (ZETIA) 10 MG tablet TAKE ONE TABLET BY MOUTH AT [facility] 05/03/21  Nafziger, Tommi Rumps, NP  fenofibrate (TRICOR) 145 MG tablet Take 1 tablet (145 mg total) by mouth daily. 04/30/21   Nafziger, Tommi Rumps, NP  losartan (COZAAR) 50 MG tablet TAKE ONE TABLET BY MOUTH EVERY MORNING 05/03/21   Nafziger, Tommi Rumps, NP  Multiple Vitamin (MULTIVITAMIN WITH MINERALS) TABS tablet Take 1 tablet by mouth daily.    [provider]  Multiple Vitamins-Minerals (PRESERVISION AREDS 2 PO) Take 1 capsule by mouth 2 (two) times daily.    [provider]  Omega-3 Fatty Acids (FISH OIL PO) Take 1 capsule by mouth in the morning and at bedtime.    [provider]  pantoprazole (PROTONIX) 40 MG tablet Take 1 tablet (40 mg total) by mouth 2 (two) times daily. 05/18/21   Cirigliano, Vito V, DO  temazepam (RESTORIL) 30 MG capsule Take 1 capsule (30 mg total) by mouth at bedtime. 05/12/21   Arfeen, Arlyce Harman, MD  Vitamin D, Cholecalciferol, 1000 UNITS TABS Take 1,000 Units by mouth daily.     [provider]    Allergies    Penicillins, Lisinopril, and Codeine  Review of Systems   Review of Systems   Neurological:  Positive for dizziness.  All other systems reviewed and are negative.  Physical Exam Updated Vital Signs BP 136/76 (BP Location: Right Arm)   Pulse 85   Temp 98.1 F (36.7 C) (Oral)   Resp 18   Ht 1.753 m ('5\' 9"'$ )   Wt 117.9 kg   SpO2 94%   BMI 38.40 kg/m   Physical Exam Vitals and nursing note reviewed.  Constitutional:      General: He is not in acute distress.    Appearance: He is well-developed.  HENT:     Head: Normocephalic and atraumatic.     Right Ear: External ear normal.     Left Ear: External ear normal.  Eyes:     General: No scleral icterus.       Right eye: No discharge.        Left eye: No discharge.     Conjunctiva/sclera: Conjunctivae normal.  Neck:     Trachea: No tracheal deviation.  Cardiovascular:     Rate and Rhythm: Normal rate and regular rhythm.  Pulmonary:     Effort: Pulmonary effort is normal. No respiratory distress.     Breath sounds: Normal breath sounds. No stridor. No wheezing or rales.  Abdominal:     General: Bowel sounds are normal. There is no distension.     Palpations: Abdomen is soft.     Tenderness: There is no abdominal tenderness. There is no guarding or rebound.  Musculoskeletal:        General: No tenderness.     Cervical back: Neck supple.  Skin:    General: Skin is warm and dry.     Findings: No rash.  Neurological:     Mental Status: He is alert and oriented to person, place, and time.     Cranial Nerves: No cranial nerve deficit (No facial droop, extraocular movements intact, tongue midline ).     Sensory: No sensory deficit.     Motor: No abnormal muscle tone or seizure activity.     Coordination: Coordination normal.     Comments: No pronator drift bilateral upper extrem, able to hold both legs off bed for 5 seconds, sensation intact in all extremities, no visual field cuts, no left or right sided neglect, , no nystagmus noted     ED Results / Procedures /  Treatments   Labs (all labs ordered  are listed, but only abnormal results are displayed) Labs Reviewed  BASIC METABOLIC PANEL - Abnormal; Notable for the following components:      Result Value   Glucose, Bld 180 (*)    All other components within normal limits  CBG MONITORING, ED - Abnormal; Notable for the following components:   Glucose-Capillary 176 (*)    All other components within normal limits  CBC WITH DIFFERENTIAL/PLATELET  TROPONIN I (HIGH SENSITIVITY)    EKG EKG Interpretation  Date/Time:  Sunday May 23 2021 18:39:02 EDT Ventricular Rate:  75 PR Interval:  200 QRS Duration: 164 QT Interval:  460 QTC Calculation: 514 R Axis:   24 Text Interpretation: Sinus rhythm Left bundle branch block No significant change since last tracing Confirmed by Dorie Rank [phone]) on 05/23/2021 7:33:43 PM  Radiology CT HEAD WO CONTRAST  Result Date: 05/23/2021 CLINICAL DATA:  Dizziness, fall.  Head trauma, mod-severe EXAM: CT HEAD WITHOUT CONTRAST TECHNIQUE: Contiguous axial images were obtained from the base of the skull through the vertex without intravenous contrast. COMPARISON:  04/24/2013 FINDINGS: Brain: No acute intracranial abnormality. Specifically, no hemorrhage, hydrocephalus, mass lesion, acute infarction, or significant intracranial injury. Mild age related volume loss. Vascular: No hyperdense vessel or unexpected calcification. Skull: No acute calvarial abnormality. Sinuses/Orbits: No acute findings Other: None IMPRESSION: No acute intracranial abnormality. Electronically Signed   By: Rolm Baptise M.D.   On: 05/23/2021 20:27   DG Chest Port 1 View  Result Date: 05/23/2021 CLINICAL DATA:  Single and fall, LEFT shoulder pain EXAM: PORTABLE CHEST 1 VIEW COMPARISON:  01/05/2021 FINDINGS: Normal heart size, mediastinal contours, and pulmonary vascularity. Minimal subsegmental atelectasis lower LEFT lung. Lungs otherwise clear. No pulmonary infiltrate, pleural effusion, or pneumothorax. Osseous structures unremarkable.  IMPRESSION: Minimal subsegmental atelectasis LEFT base. Electronically Signed   By: Lavonia Dana M.D.   On: 05/23/2021 19:12   DG Shoulder Left  Result Date: 05/23/2021 CLINICAL DATA:  Syncope, fall, shoulder pain EXAM: LEFT SHOULDER - 2+ VIEW COMPARISON:  None FINDINGS: Osseous demineralization. Degenerative changes of LEFT AC joint. Fracture of the surgical neck LEFT humerus with question additional fracture plane at the greater tuberosity. Fracture is minimally displaced without evidence of dislocation. Visualized ribs intact. IMPRESSION: Minimally displaced fracture at surgical head of LEFT humerus, potentially within additional fracture plane through the greater tuberosity. Osseous demineralization with degenerative changes LEFT AC joint. Electronically Signed   By: Lavonia Dana M.D.   On: 05/23/2021 19:11    Procedures Procedures   Medications Ordered in ED Medications  0.9 %  sodium chloride infusion ( Intravenous New Bag/Given 05/23/21 1842)  morphine 4 MG/ML injection 4 mg (4 mg Intravenous Given 05/23/21 1839)  ondansetron (ZOFRAN) injection 4 mg (4 mg Intravenous Given 05/23/21 1838)  morphine 4 MG/ML injection 4 mg (4 mg Intravenous Given 05/23/21 2220)    ED Course  I have reviewed the triage vital signs and the nursing notes.  Pertinent labs & imaging results that were available during my care of the patient were reviewed by me and considered in my medical decision making (see chart for details).  Clinical Course as of 05/23/21 2245  Sun May 23, 2021  1934 Left humeral surgical head fracture [JK]  1934 Chest x-ray with minimal atelectasis [JK]  2241 Labs reviewed.  No acute findings.  CT scan without acute abnormality.  Patient has been able to get up and walk without recurrent ataxia or dizziness [JK]  Clinical Course User Index [JK] Dorie Rank, MD   MDM Rules/Calculators/A&P                           Patient presented to the ED for evaluation of dizziness.  He was concerned  about the possibility of anemia, dehydration, cardiac dysrhythmia, stroke or peripheral vertigo.  Patient's neurologic exam is normal and reassuring.  He was able to get up and walk without ataxia while he was in the ED.  Head CT performed and no signs of injury or stroke.  Laboratory tests are unremarkable.  Patient is x-ray does show evidence of a proximal humerus fracture.  Patient will be placed in a sling.  Outpatient follow-up with orthopedics. Final Clinical Impression(s) / ED Diagnoses Final diagnoses:  Vertigo  Closed fracture of proximal end of left humerus, unspecified fracture morphology, initial encounter    Rx / DC Orders ED Discharge Orders          Ordered    HYDROcodone-acetaminophen (NORCO/VICODIN) 5-325 MG tablet  Every 6 hours PRN        05/23/21 2243    meclizine (ANTIVERT) 25 MG tablet  3 times daily PRN        05/23/21 2243             Dorie Rank, MD 05/23/21 2248

## 2021-05-25 ENCOUNTER — Telehealth: Payer: Self-pay | Admitting: *Deleted

## 2021-05-25 NOTE — Telephone Encounter (Signed)
Spoke with patient and his wife to advise of the Colonoscopy date and time at [facility]. Instructions and a sample of Clenpiq left at 3rd floor reception desk. Mrs Ranft advised she would be by in the next few days to pick these up.

## 2021-05-26 DIAGNOSIS — S42212A Unspecified displaced fracture of surgical neck of left humerus, initial encounter for closed fracture: Secondary | ICD-10-CM | POA: Diagnosis not present

## 2021-06-02 DIAGNOSIS — S42212A Unspecified displaced fracture of surgical neck of left humerus, initial encounter for closed fracture: Secondary | ICD-10-CM | POA: Diagnosis not present

## 2021-06-02 DIAGNOSIS — M25512 Pain in left shoulder: Secondary | ICD-10-CM | POA: Diagnosis not present

## 2021-06-03 ENCOUNTER — Other Ambulatory Visit: Payer: Self-pay

## 2021-06-04 ENCOUNTER — Encounter: Payer: Self-pay | Admitting: Adult Health

## 2021-06-04 ENCOUNTER — Ambulatory Visit (INDEPENDENT_AMBULATORY_CARE_PROVIDER_SITE_OTHER): Payer: Medicare HMO | Admitting: Adult Health

## 2021-06-04 VITALS — BP 130/60 | HR 75 | Temp 98.3°F | Ht 69.0 in | Wt 264.0 lb

## 2021-06-04 DIAGNOSIS — Z23 Encounter for immunization: Secondary | ICD-10-CM | POA: Diagnosis not present

## 2021-06-04 DIAGNOSIS — S42292D Other displaced fracture of upper end of left humerus, subsequent encounter for fracture with routine healing: Secondary | ICD-10-CM

## 2021-06-04 NOTE — Progress Notes (Signed)
Subjective:    Patient ID: Sean Hill, male    DOB: [birth year], 80 y.o.   MRN: [id number]  HPI 80 year old male who  has a past medical history of Anal fissure, Anxiety, Arthritis, Asthma, Bipolar disorder ([place]), Cataract, Depression, GERD (gastroesophageal reflux disease), Hemorrhage of colon following colonoscopy (10/08/2019), History of kidney stones, History of MRSA infection, Hyperlipidemia, Hypertension, Impaired hearing, Left bundle branch block (LBBB) on electrocardiogram (10/29/2015), Macular degeneration (senile) of retina, and Wears glasses.  He presents to the office today for follow-up after being seen in the emergency room on 05/23/2021.  He presented to the emergency room after having some issues with vertigo over the few days prior.  He was having intermittent episodes of dizziness where the room was spinning.  He had not had any trouble with vision or speech.  He had an episode on the day that he went to the emergency room where he fell although he did not think he passed out.  He did injure his shoulder in the fall and was having significant pain in the right upper part of his left shoulder.  In the ER his neurologic exam was normal and reassuring.  He was able to get up and walk without ataxia while in the emergency room.  Head CT performed showed no signs of injury or stroke.  His labs were unremarkable.  X-ray showed evidence of proximal humerus fracture.  He was placed in a sling and advised to follow-up with orthopedics  He has been seen by Orthopedics, Dr. Veverly Fells twice. They are waiting to see if he needs surgery. Has a follow up appointment in acouple of weeks.   His pain is controlled with [place]  Has had no other episodes of vertigo.   Has no acute complaints today    Review of Systems See HPI   Past Medical History:  Diagnosis Date   Anal fissure    Anxiety    Arthritis    right wrist; pt. states "everywhere"   Asthma    Bipolar disorder ([place])     Cataract    Depression    GERD (gastroesophageal reflux disease)    Hemorrhage of colon following colonoscopy 10/08/2019   History of kidney stones    History of MRSA infection    left knee after arthroplasty   Hyperlipidemia    Hypertension    states under control with med., has been on med. x 1 yr.   Impaired hearing    Left bundle branch block (LBBB) on electrocardiogram 10/29/2015   Macular degeneration (senile) of retina    left   Wears glasses     Social History   Socioeconomic History   Marital status: Married    Spouse name: Not on file   Number of children: Not on file   Years of education: Not on file   Highest education level: Not on file  Occupational History   Not on file  Tobacco Use   Smoking status: Never   Smokeless tobacco: Never  Vaping Use   Vaping Use: Never used  Substance and Sexual Activity   Alcohol use: Not Currently   Drug use: No   Sexual activity: Not Currently    Birth control/protection: None  Other Topics Concern   Not on file  Social History Narrative   Mar[place], [place]  Retired Dance movement psychotherapist.   Two daughters, both married,3 grandchildren ( 48, 42, 1 year)    No regular exercise.  Never smoker.  No ETOH.  No drugs.   Social Determinants of Health   Financial Resource Strain: Low Risk    Difficulty of Paying Living Expenses: Not hard at all  Food Insecurity: No Food Insecurity   Worried About Charity fundraiser in the Last Year: Never true   [place] in the Last Year: Never true  Transportation Needs: No Transportation Needs   Lack of Transportation (Medical): No   Lack of Transportation (Non-Medical): No  Physical Activity: Inactive   Days of Exercise per Week: 0 days   Minutes of Exercise per Session: 0 min  Stress: No Stress Concern Present   Feeling of Stress : Not at all  Social Connections: Moderately Isolated   Frequency of Communication with Friends and Family: Once a week   Frequency of Social  Gatherings with Friends and Family: More than three times a week   Attends Religious Services: Never   Marine scientist or Organizations: No   Attends Archivist Meetings: Never   Marital Status: Married  Human resources officer Violence: Not At Risk   Fear of Current or Ex-Partner: No   Emotionally Abused: No   Physically Abused: No   Sexually Abused: No    Past Surgical History:  Procedure Laterality Date   ANKLE FUSION Bilateral    ANKLE SURGERY Bilateral    ligament surgery   BIOPSY  04/29/2020   Procedure: BIOPSY;  Surgeon: Lavena Bullion, DO;  Location: [facility];  Service: Gastroenterology;;   CARDIAC CATHETERIZATION  x 2   1983; 11/14/2003   CARPOMETACARPEL SUSPENSION PLASTY Right 09/22/2016   Procedure: right thumb carpometacarpal  ARTHROPLASTY;  Surgeon: Leanora Cover, MD;  Location: [facility];  Service: Orthopedics;  Laterality: Right;  right thumb carpometacarpal  ARTHROPLASTY   CARPOMETACARPEL SUSPENSION PLASTY Right 11/02/2017   Procedure: RIGHT THUMB SUSPENSIONPLASTY WITH TIGHTROPE, DISTAL POLE SCAPHOID  EXCISION;  Surgeon: Leanora Cover, MD;  Location: [facility];  Service: Orthopedics;  Laterality: Right;   COLONOSCOPY WITH PROPOFOL N/A 10/02/2019   Procedure: COLONOSCOPY WITH PROPOFOL;  Surgeon: Lavena Bullion, DO;  Location: [facility];  Service: Gastroenterology;  Laterality: N/A;   COLONOSCOPY WITH PROPOFOL N/A 10/09/2019   Procedure: COLONOSCOPY WITH PROPOFOL;  Surgeon: Gatha Mayer, MD;  Location: [facility];  Service: Endoscopy;  Laterality: N/A;   COLONOSCOPY WITH PROPOFOL N/A 04/29/2020   Procedure: COLONOSCOPY WITH PROPOFOL;  Surgeon: Lavena Bullion, DO;  Location: [facility];  Service: Gastroenterology;  Laterality: N/A;   ELBOW SURGERY Left    ENDOSCOPIC MUCOSAL RESECTION N/A 10/02/2019   Procedure: ENDOSCOPIC MUCOSAL RESECTION;  Surgeon: Lavena Bullion, DO;  Location: [facility];  Service:  Gastroenterology;  Laterality: N/A;   HEMOSTASIS CLIP PLACEMENT  10/09/2019   Procedure: HEMOSTASIS CLIP PLACEMENT;  Surgeon: Gatha Mayer, MD;  [facility]: [facility];  Service: Endoscopy;;   INGUINAL HERNIA REPAIR Right    OPEN REDUCTION INTERNAL FIXATION (ORIF) DISTAL RADIAL FRACTURE Right 10/29/2015   Procedure: OPEN REDUCTION INTERNAL FIXATION (ORIF) DISTAL RADIAL FRACTURE;  Surgeon: Leanora Cover, MD;  Location: [facility];  Service: Orthopedics;  Laterality: Right;   ORIF ELBOW FRACTURE Right    POLYPECTOMY  04/29/2020   Procedure: POLYPECTOMY;  Surgeon: Lavena Bullion, DO;  [facility];  Service: Gastroenterology;;   SUBMUCOSAL LIFTING INJECTION  10/02/2019   Procedure: SUBMUCOSAL LIFTING INJECTION;  Surgeon: Lavena Bullion, DO;  [facility];  Service: Gastroenterology;;   TONSILLECTOMY  TOTAL KNEE ARTHROPLASTY Bilateral    ULNAR COLLATERAL LIGAMENT REPAIR Right 12/29/2015   Procedure: REPAIR  RIGHT LATERAL ULNAR COLLATERAL LIGAMENT TEAR  EXTENSOR ORIGIN ;  Surgeon: Leanora Cover, MD;  Location: [facility] [facility];  Service: Orthopedics;  Laterality: Right;   WRIST ARTHROSCOPY WITH DEBRIDEMENT Right 07/14/2016   Procedure: RIGHT WRIST ARTHROSCOPY WITH DEBRIDEMENT  TRIANGULAR FIBROCARTILAGE COMPLEX;  Surgeon: Leanora Cover, MD;  Location: [facility];  Service: Orthopedics;  Laterality: Right;    Family History  Problem Relation Age of Onset   Alcohol abuse Mother    Ovarian cancer Mother    Alcohol abuse Father    Pancreatic cancer Father    Hyperlipidemia Brother    Barrett's esophagus Brother    Colon cancer Maternal Grandmother    Depression Daughter    Paranoid behavior Daughter    Esophageal cancer Neg Hx    Stomach cancer Neg Hx    Rectal cancer Neg Hx    Colon polyps Neg Hx     Allergies  Allergen Reactions   Penicillins Other (See Comments)    BLISTERS Did it involve swelling of the face/tongue/throat,  SOB, or low BP? No Did it involve sudden or severe rash/hives, skin peeling, or any reaction on the inside of your mouth or nose? No Did you need to seek medical attention at a hospital or doctor's office? No When did it last happen? around 1980    If all above answers are "NO", may proceed with cephalosporin use.    Lisinopril     Dry cough, abdominal bloating and diarrhea   Codeine Rash    Current Outpatient Medications on File Prior to Visit  Medication Sig Dispense Refill   albuterol (VENTOLIN HFA) 108 (90 Base) MCG/ACT inhaler Inhale 2 puffs into the lungs every 6 (six) hours as needed for wheezing or shortness of breath. (Patient not taking: No sig reported) 6.7 g 3   ARIPiprazole ER (ABILIFY MAINTENA) 400 MG PRSY prefilled syringe Inject 400 mg into the muscle every 28 (twenty-eight) days. 1 each 2   benztropine (COGENTIN) 0.5 MG tablet Take 1 tablet (0.5 mg total) by mouth daily. 90 tablet 0   citalopram (CELEXA) 20 MG tablet Take 1 tablet (20 mg total) by mouth daily. 90 tablet 0   ezetimibe (ZETIA) 10 MG tablet TAKE ONE TABLET BY MOUTH AT NOON 90 tablet 3   fenofibrate (TRICOR) 145 MG tablet Take 1 tablet (145 mg total) by mouth daily. 90 tablet 3   HYDROcodone-acetaminophen (NORCO/VICODIN) 5-325 MG tablet Take 1 tablet by mouth every 6 (six) hours as needed. 12 tablet 0   losartan (COZAAR) 50 MG tablet TAKE ONE TABLET BY MOUTH EVERY MORNING 90 tablet 1   meclizine (ANTIVERT) 25 MG tablet Take 1 tablet (25 mg total) by mouth 3 (three) times daily as needed for dizziness. 30 tablet 0   Multiple Vitamin (MULTIVITAMIN WITH MINERALS) TABS tablet Take 1 tablet by mouth daily.     Multiple Vitamins-Minerals (PRESERVISION AREDS 2 PO) Take 1 capsule by mouth 2 (two) times daily.     Omega-3 Fatty Acids (FISH OIL PO) Take 1 capsule by mouth in the morning and at bedtime.     pantoprazole (PROTONIX) 40 MG tablet Take 1 tablet (40 mg total) by mouth 2 (two) times daily. 180 tablet 1    temazepam (RESTORIL) 30 MG capsule Take 1 capsule (30 mg total) by mouth at bedtime. 90 capsule 0   Vitamin D, Cholecalciferol, 1000 UNITS  TABS Take 1,000 Units by mouth daily.      Current Facility-Administered Medications on File Prior to Visit  Medication Dose Route Frequency Provider Last Rate Last Admin   ARIPiprazole ER (ABILIFY MAINTENA) 400 MG prefilled syringe 400 mg  400 mg Intramuscular Q28 days Arfeen, Arlyce Harman, MD   400 mg at 05/18/21 1131    There were no vitals taken for this visit.      Objective:   Physical Exam Vitals and nursing note reviewed.  Constitutional:      Appearance: Normal appearance.  Musculoskeletal:     Left shoulder: Tenderness and bony tenderness present. Decreased range of motion. Decreased strength.     Comments: Left arm in sling    Skin:    General: Skin is warm and dry.     Capillary Refill: Capillary refill takes less than 2 seconds.  Neurological:     General: No focal deficit present.     Mental Status: He is alert and oriented to person, place, and time.  Psychiatric:        Mood and Affect: Mood normal.        Behavior: Behavior normal.        Thought Content: Thought content normal.        Judgment: Judgment normal.      Assessment & Plan:   1. Other closed displaced fracture of proximal end of left humerus with routine healing, subsequent encounter - Seems to be doing well.  - Reviewed ER notes and imaging with patient.  - Follow up with orthopedics as directed  [name], NP

## 2021-06-07 ENCOUNTER — Encounter: Payer: Medicare HMO | Admitting: Gastroenterology

## 2021-06-08 DIAGNOSIS — R69 Illness, unspecified: Secondary | ICD-10-CM | POA: Diagnosis not present

## 2021-06-08 DIAGNOSIS — F411 Generalized anxiety disorder: Secondary | ICD-10-CM | POA: Diagnosis not present

## 2021-06-16 ENCOUNTER — Telehealth: Payer: Self-pay | Admitting: Gastroenterology

## 2021-06-16 DIAGNOSIS — S42212A Unspecified displaced fracture of surgical neck of left humerus, initial encounter for closed fracture: Secondary | ICD-10-CM | POA: Diagnosis not present

## 2021-06-16 DIAGNOSIS — M25512 Pain in left shoulder: Secondary | ICD-10-CM | POA: Diagnosis not present

## 2021-06-16 NOTE — Telephone Encounter (Signed)
Patients wife called to reschedule hospital procedure patient just broke his arms from a fall\accident.

## 2021-06-16 NOTE — Telephone Encounter (Signed)
Spoke with patient's wife to reschedule. I offered her next available hospital date which are in December. She states that patient will be having a knee surgery on 08/25/21. Patient's wife states that she will call us back at the beginning of the year to reschedule. Pt's wife had no concerns at the end of the call.  4 month recall in epic.  Colonoscopy at [facility] has been cancelled.

## 2021-06-22 ENCOUNTER — Other Ambulatory Visit: Payer: Self-pay

## 2021-06-22 ENCOUNTER — Encounter (HOSPITAL_COMMUNITY): Payer: Self-pay | Admitting: *Deleted

## 2021-06-22 ENCOUNTER — Encounter: Payer: Self-pay | Admitting: Gastroenterology

## 2021-06-22 ENCOUNTER — Ambulatory Visit (HOSPITAL_BASED_OUTPATIENT_CLINIC_OR_DEPARTMENT_OTHER): Payer: Medicare HMO | Admitting: *Deleted

## 2021-06-22 VITALS — BP 182/76 | HR 90 | Ht 68.0 in | Wt 266.2 lb

## 2021-06-22 DIAGNOSIS — F319 Bipolar disorder, unspecified: Secondary | ICD-10-CM | POA: Diagnosis not present

## 2021-06-22 DIAGNOSIS — F411 Generalized anxiety disorder: Secondary | ICD-10-CM | POA: Diagnosis not present

## 2021-06-22 DIAGNOSIS — R69 Illness, unspecified: Secondary | ICD-10-CM | POA: Diagnosis not present

## 2021-06-22 NOTE — Patient Instructions (Signed)
Pt in clinic today for due injection of Abilify Maintena 400 mg. Pt is pleasant and cooperative. Appropriate in mood and affect. Injection prepared as ordered and given in right upper outer quadrant w/o compliant. Pt to return in approximately 4 weeks for next due injection.

## 2021-06-23 DIAGNOSIS — H353232 Exudative age-related macular degeneration, bilateral, with inactive choroidal neovascularization: Secondary | ICD-10-CM | POA: Diagnosis not present

## 2021-06-24 ENCOUNTER — Other Ambulatory Visit (HOSPITAL_COMMUNITY): Payer: Medicare HMO

## 2021-06-30 ENCOUNTER — Encounter (HOSPITAL_COMMUNITY): Payer: Self-pay

## 2021-06-30 ENCOUNTER — Ambulatory Visit (HOSPITAL_COMMUNITY): Admit: 2021-06-30 | Payer: Medicare HMO | Admitting: Gastroenterology

## 2021-06-30 DIAGNOSIS — S42212A Unspecified displaced fracture of surgical neck of left humerus, initial encounter for closed fracture: Secondary | ICD-10-CM | POA: Diagnosis not present

## 2021-06-30 SURGERY — COLONOSCOPY WITH PROPOFOL
Anesthesia: Monitor Anesthesia Care

## 2021-07-02 ENCOUNTER — Encounter: Payer: Self-pay | Admitting: Adult Health

## 2021-07-02 ENCOUNTER — Telehealth (INDEPENDENT_AMBULATORY_CARE_PROVIDER_SITE_OTHER): Payer: Medicare HMO | Admitting: Adult Health

## 2021-07-02 VITALS — HR 75 | Temp 100.0°F | Ht 68.0 in | Wt 266.0 lb

## 2021-07-02 DIAGNOSIS — J988 Other specified respiratory disorders: Secondary | ICD-10-CM

## 2021-07-02 DIAGNOSIS — M25512 Pain in left shoulder: Secondary | ICD-10-CM | POA: Diagnosis not present

## 2021-07-02 MED ORDER — DOXYCYCLINE HYCLATE 100 MG PO CAPS
100.0000 mg | ORAL_CAPSULE | Freq: Two times a day (BID) | ORAL | 0 refills | Status: DC
Start: 2021-07-02 — End: 2021-07-09

## 2021-07-02 MED ORDER — PREDNISONE 10 MG PO TABS: ORAL_TABLET | ORAL | 0 refills | Status: DC

## 2021-07-02 NOTE — Progress Notes (Signed)
Virtual Visit via Telephone Note  I connected with Sean Hill on 07/02/21 at  4:00 PM EDT by telephone and verified that I am speaking with the correct person using two identifiers.   I discussed the limitations, risks, security and privacy concerns of performing an evaluation and management service by telephone and the availability of in person appointments. I also discussed with the patient that there may be a patient responsible charge related to this service. The patient expressed understanding and agreed to proceed.  Location patient: home Location provider: work or home office Participants present for the call: patient, provider Patient did not have a visit in the prior 7 days to address this/these issue(s).   History of Present Illness: 80 year old male who  has a past medical history of Anal fissure, Anxiety, Arthritis, Asthma, Bipolar disorder ([place]), Cataract, Depression, GERD (gastroesophageal reflux disease), Hemorrhage of colon following colonoscopy (10/08/2019), History of kidney stones, History of MRSA infection, Hyperlipidemia, Hypertension, Impaired hearing, Left bundle branch block (LBBB) on electrocardiogram (10/29/2015), Macular degeneration (senile) of retina, and Wears glasses.  He is being evaluated today for an acute issue. His symptoms started over the last week. Symptoms include productive cough with discolored mucus, sinus congestions Fever up to 100, wheezing, mild sinus pain and pressure, fatigue  Denies chills, shortness of breath    Observations/Objective: Patient sounds ill on the phone I do not appreciate any SOB. + Auditory wheezing with cough  Speech and thought processing are grossly intact. Patient reported vitals:  Assessment and Plan: 1. Respiratory infection  - doxycycline (VIBRAMYCIN) 100 MG capsule; Take 1 capsule (100 mg total) by mouth 2 (two) times daily.  Dispense: 14 capsule; Refill: 0 - predniSONE (DELTASONE) 10 MG tablet; 40 mg x 3  days, 20 mg x 3 days, 10 mg x 3 days  Dispense: 21 tablet; Refill: 0 - Follow up if no improvement in the next 72 hours  Follow Up Instructions:   I did not refer this patient for an OV in the next 24 hours for this/these issue(s).  I discussed the assessment and treatment plan with the patient. The patient was provided an opportunity to ask questions and all were answered. The patient agreed with the plan and demonstrated an understanding of the instructions.   The patient was advised to call back or seek an in-person evaluation if the symptoms worsen or if the condition fails to improve as anticipated.  I provided 13 minutes of non-face-to-face time during this encounter.   [name], NP

## 2021-07-08 ENCOUNTER — Encounter: Payer: Self-pay | Admitting: Adult Health

## 2021-07-08 ENCOUNTER — Telehealth: Payer: Self-pay | Admitting: Pharmacist

## 2021-07-08 NOTE — Chronic Care Management (AMB) (Signed)
    Chronic Care Management Pharmacy Assistant   Name: ALEJOS REINHARDT  MRN: [id number] DOB: [birth year]  07/09/21 APPOINTMENT REMINDER   [name] was reminded to have all medications, supplements and any blood glucose and blood pressure readings available for review with Jeni Salles, Pharm. D, at his telephone visit on 07/09/21 at 12pm.  Questions: Have you had any recent office visit or specialist visit outside of [facility]? Yes. [facility].   Are there any concerns you would like to discuss during your office visit? No concerns at this time. Patient did fall and break his shoulder.   Are you having any problems obtaining your medications? No problems at this time.  If patient has any PAP medications ask if they are having any problems getting their PAP medication or refill? No patient assistance currently.   Care Gaps:  AWV - completed on 09/25/20 Covid-19 vaccine booster 5 - overdue since 03/26/21 Last PCP BP: 130/60 P: 75   Star Rating Drug:  Losartan 50mg  - last filled on 05/10/21 90DS at [facility]  Any gaps in medications fill history? No.  [place]  Clinical Pharmacist Assistant [phone]

## 2021-07-09 ENCOUNTER — Telehealth: Payer: Self-pay | Admitting: Adult Health

## 2021-07-09 ENCOUNTER — Other Ambulatory Visit: Payer: Self-pay | Admitting: Adult Health

## 2021-07-09 ENCOUNTER — Ambulatory Visit (INDEPENDENT_AMBULATORY_CARE_PROVIDER_SITE_OTHER): Payer: Medicare HMO | Admitting: Pharmacist

## 2021-07-09 DIAGNOSIS — J988 Other specified respiratory disorders: Secondary | ICD-10-CM

## 2021-07-09 DIAGNOSIS — I10 Essential (primary) hypertension: Secondary | ICD-10-CM

## 2021-07-09 DIAGNOSIS — M25512 Pain in left shoulder: Secondary | ICD-10-CM | POA: Diagnosis not present

## 2021-07-09 DIAGNOSIS — E7849 Other hyperlipidemia: Secondary | ICD-10-CM

## 2021-07-09 MED ORDER — HYDROCODONE BIT-HOMATROP MBR 5-1.5 MG/5ML PO SOLN: 5.0000 mL | Freq: Three times a day (TID) | ORAL | 0 refills | Status: DC | PRN

## 2021-07-09 MED ORDER — PREDNISONE 20 MG PO TABS
20.0000 mg | ORAL_TABLET | Freq: Every day | ORAL | 0 refills | Status: DC
Start: 2021-07-09 — End: 2021-08-26

## 2021-07-09 NOTE — Telephone Encounter (Signed)
Please advise 

## 2021-07-09 NOTE — Patient Instructions (Signed)
Hi Sean Hill,  I hope you feel better soon! I'll let you know what Sean Hill says when I hear back. In the meantime, go ahead and look at the information I attached about lowering triglycerides through diet.  Please reach out to me if you have any questions or need anything before our follow up!  Best, Sean Hill  Sean Hill, PharmD, [facility]    Visit Information   Goals Addressed   None    Patient Care Plan: CCM Pharmacy Care Plan     Problem Identified: Problem: Hypertension, Hyperlipidemia, Diabetes, GERD, Allergic Rhinitis and insomnia, bipolar disorder      Long-Range Goal: Patient-Specific Goal   Start Date: 11/11/2020  Expected End Date: 11/11/2021  Recent Progress: On track  Priority: High  Note:   Current Barriers:  Unable to independently monitor therapeutic efficacy Does not contact provider office for questions/concerns  Pharmacist Clinical Goal(s):  Patient will achieve adherence to monitoring guidelines and medication adherence to achieve therapeutic efficacy through collaboration with PharmD and provider.    Interventions: 1:1 collaboration with Sean Peng, NP regarding development and update of comprehensive plan of care as evidenced by provider attestation and co-signature Inter-disciplinary care team collaboration (see longitudinal plan of care) Comprehensive medication review performed; medication list updated in electronic medical record  Hypertension (BP goal <140/90) -Uncontrolled -Current treatment: Losartan 50 mg 1 tablet daily  -Medications previously tried: lisinopril (cough) -Current home readings: 130/70-80; 168 (in office) - arm cuff -Current dietary habits:  tries to avoid using salt with foods; drinks 1-2 cups of coffee in the morning -Current exercise habits:  patient does not do any structured exercise due to shoulder pain and double knee replacements; patient can walk and  would like to start being more active -Denies hypotensive/hypertensive symptoms -Educated on BP goals and benefits of medications for prevention of heart attack, stroke and kidney damage; Exercise goal of 150 minutes per week; Importance of home blood pressure monitoring; -Counseled to monitor BP at home weekly, document, and provide log at future appointments -Counseled on diet and exercise extensively Recommended to continue current medication Recommended setting a goal for exercise he is able to accomplish such as 15 minutes 2-3 days a week Recommended bringing home BP monitor to next office visit  Hyperlipidemia: (LDL goal < 100) -Controlled; Uncontrolled triglycerides -Current treatment: Ezetimibe 10 mg 1 tablet daily Fish oil 1000 mg capsule twice daily Fenofibrate 160 mg 1 tablet daily -Medications previously tried: statins  -Current dietary patterns: tries to avoid fatty foods; eating more sweets lately and does not drink alcohol -Current exercise habits: patient does not do any structured exercise due to shoulder pain and double knee replacements; patient can walk and would like to start being more active -Educated on Cholesterol goals;  Importance of limiting foods high in cholesterol; Exercise goal of 150 minutes per week; -Recommended to continue current medication Recommended repeat lipid panel  Pre-diabetes (A1c goal <6.5%) -Controlled -Current medications: No medications -Medications previously tried: none  -Current home glucose readings fasting glucose: none post prandial glucose: none -Denies hypoglycemic/hyperglycemic symptoms -Current meal patterns:  breakfast: did not discuss  lunch: did not discuss  dinner: did not discuss snacks: did not discuss drinks: did not discuss -Current exercise: patient does not do any structured exercise due to shoulder pain and double knee replacements; patient can walk and would like to start being more active -Educated  onCarbohydrate counting and/or plate method -Counseled to check feet daily and get yearly  eye exams -Counseled on diet and exercise extensively Provided healthy plate handout  GERD (Goal: minimize symptoms of heartburn/acid reflux) -Controlled -Current treatment  Pantoprazole 40mg , 1 tablet twice daily -Medications previously tried: none  -Recommended to continue current medication  Bipolar disorder (Goal: minimize symptoms) -Controlled -Current treatment  Aripiprazole ER 400mg , inject 400mg  every twenty-eight days  Citalopram 20mg , 1 tablet once daily -Medications previously tried: unknown  -Recommended to continue current medication  Sinus congestion (Goal: minimize symptoms of congestion) -Uncontrolled -Current treatment  Fluticasone nasal spray, 1 spray into both nostrils in the morning and at bedtime   Zyrtec 10 mg daily -Medications previously tried: chlopheniramine (switched to safer alternative Zyrtec) -Recommended to continue current medication  Insomnia (Goal: improve quality and quantity of sleep) -Controlled -Current treatment  Temazepam 30mg , 1 capsule at bedtime  -Medications previously tried: several medications, trazodone (ineffective) -Recommended to continue current medication Counseled on practicing good sleep hygiene by setting a sleep schedule and maintaining it, avoid excessive napping, following a nightly routine, avoiding screen time for 30-60 minutes before going to bed, and making the bedroom a cool, quiet and dark space  Recommended trial of melatonin timed release to help with staying asleep  Health Maintenance -Vaccine gaps: none -Current therapy:  Multivitamin, 1 tablet once daily Preservision AREDS 2, 1 capsule twice daily Vitamin D3, 1,000 units once daily  -Educated on Cost vs benefit of each product must be carefully weighed by individual consumer -Patient is satisfied with current therapy and denies issues -Recommended to continue  current medication   Patient Goals/Self-Care Activities Patient will:  - take medications as prescribed check blood pressure weekly, document, and provide at future appointments target a minimum of 150 minutes of moderate intensity exercise weekly  Follow Up Plan: Telephone follow up appointment with care management team member scheduled for: 4 months       Patient verbalizes understanding of instructions provided today and agrees to view in [place].  The pharmacy team will reach out to the patient again over the next 30 days.   Sean Hill, [facility]

## 2021-07-09 NOTE — Progress Notes (Signed)
Chronic Care Management Pharmacy Note  07/09/2021 Name:  Sean Hill MRN:  [id number] DOB:  Feb 13, 1941  Summary: TGs not at goal < 150 BP is mostly at goal < 140/90  Recommendations/Changes made from today's visit: -Recommend repeat lipid panel to recheck triglycerides -Consider Vascepa or Lovaza for if triglycerides are not < 500 at recheck -Recommended bringing home BP monitor to next office visit  Plan: Follow up after lipid panel  Subjective: Sean Hill is an 80 y.o. year old male who is a primary patient of [name], NP.  The CCM team was consulted for assistance with disease management and care coordination needs.    Engaged with patient by telephone for follow up visit in response to provider referral for pharmacy case management and/or care coordination services.   Consent to Services:  The patient was given information about Chronic Care Management services, agreed to services, and gave verbal consent prior to initiation of services.  Please see initial visit note for detailed documentation.   Patient Care Team: [name], NP as PCP - General (Family Medicine) Leanora Cover, MD as Consulting Physician (Orthopedic Surgery) Viona Gilmore, [facility] as Pharmacist (Pharmacist)  Recent office visits: 07/02/21 [name], NP: Patient presented for cough and sore throat. Prescribed doxycycline and prednisone.   06/04/21 [name], NP: Patient presented for ED visit follow up after fracture of humerus.  04/29/21 [name], NP: Patient presented for Routine general medical examination and other concerns. No medication changes.  Recent consult visits: 07/02/21 Nehemiah Massed, PT (emergeortho): Patient presented for shoulder pain. Unable to access notes.  06/22/21 Gracelyn Nurse, LCSW (psychiatry): Patient presented for depression and anxiety follow up. Unable to access notes.  06/22/21 Alison Murray, LPN (behavioral health): Patient  presented for aripiprazole injection.  06/16/21 Esmond Plants (ortho): Patient presented for shoulder pain and fracture of humerus. Unable to access notes.  06/08/21 Gracelyn Nurse, LCSW (psychiatry): Patient presented for depression and anxiety follow up. Unable to access notes.  05/18/21 Alison Murray, LPN (behavioral health): Patient presented for aripiprazole injection.  05/17/21 Endocscopy center: Patient presented for pre colonoscopy visit.  05/05/21 Berniece Andreas, MD (behavioral health): Patient presented for bipolar follow up.   04-15-2021 Mardis Michelle,LPN [facility]) - Patient presented for Bipolar Disorder, no other visit details available.   03-18-2021 Tresa Res, LCSW (Psychiatry) - Patient presented for anxiety and depression. No medication changes noted.   03-17-2021 Darol Destine [facility]) - Patient presented for pain of right shoulder joint. No medication changes noted.   03/08/21 Theresa Duty, PA (emergeortho): Unable to access notes.  02/12/21 Alison Murray, LPN (behavioral health): Patient presented for aripiprazole injection.  02/02/21 Berniece Andreas, MD (behavioral health): Patient presented for bipolar follow up. Decreased Cogentin to 0.5 mg and d/c'd trazodone.  Hospital visits: 05/23/21 Patient presented to [facility] ED for dizziness and fall. Prescribed Norco and meclizine.  Objective:  Lab Results  Component Value Date   CREATININE 0.88 05/23/2021   BUN 12 05/23/2021   GFR 83.60 04/29/2021   GFRNONAA >60 05/23/2021   GFRAA >60 10/09/2019   NA 142 05/23/2021   K 3.7 05/23/2021   CALCIUM 9.5 05/23/2021   CO2 25 05/23/2021    Lab Results  Component Value Date/Time   HGBA1C 6.0 04/29/2021 11:06 AM   HGBA1C 6.0 12/25/2019 08:23 AM   GFR 83.60 04/29/2021 11:06 AM   GFR 59.27 (L) 12/25/2019 08:23 AM   MICROALBUR 1.5 11/28/2011 04:28 PM  Last diabetic Eye exam: No results found for: HMDIABEYEEXA  Last  diabetic Foot exam: No results found for: HMDIABFOOTEX   Lab Results  Component Value Date   CHOL 200 04/29/2021   HDL 34.60 (L) 04/29/2021   LDLDIRECT 65.0 04/29/2021   TRIG (H) 04/29/2021    742.0 Triglyceride is over 400; calculations on Lipids are invalid.   CHOLHDL 6 04/29/2021    Hepatic Function Latest Ref Rng & Units 04/29/2021 08/07/2020 12/25/2019  Total Protein 6.0 - 8.3 g/dL 6.6 7.2 6.7  Albumin 3.5 - 5.2 g/dL 4.1 3.9 4.5  AST 0 - 37 U/L 35 29 25  ALT 0 - 53 U/L 31 20 17   Alk Phosphatase 39 - 117 U/L 91 63 59  Total Bilirubin 0.2 - 1.2 mg/dL 0.7 1.0 0.5  Bilirubin, Direct 0.0 - 0.3 mg/dL - - -    Lab Results  Component Value Date/Time   TSH 1.90 04/29/2021 11:06 AM   TSH 2.30 12/25/2019 08:23 AM   FREET4 0.89 07/21/2011 06:52 PM    CBC Latest Ref Rng & Units 05/23/2021 04/29/2021 08/07/2020  WBC 4.0 - 10.5 K/uL 7.2 4.7 9.0  Hemoglobin 13.0 - 17.0 g/dL 15.4 15.6 14.3  Hematocrit 39.0 - 52.0 % 44.7 46.1 41.7  Platelets 150 - 400 K/uL 215 209.0 189    No results found for: VD25OH  Clinical ASCVD: No  The 10-year ASCVD risk score (Arnett DK, et al., 2019) is: 57.4%   Values used to calculate the score:     Age: 79 years     Sex: Male     Is Non-Hispanic African American: No     Diabetic: No     Tobacco smoker: No     Systolic Blood Pressure: 892 mmHg     Is BP treated: Yes     HDL Cholesterol: 34.6 mg/dL     Total Cholesterol: 200 mg/dL    Depression screen [facility] 2/9 09/25/2020 11/07/2018 09/21/2017  Decreased Interest 0 0 0  Down, Depressed, Hopeless 0 0 -  PHQ - 2 Score 0 0 0  Some recent data might be hidden      Social History   Tobacco Use  Smoking Status Never  Smokeless Tobacco Never   BP Readings from Last 3 Encounters:  06/22/21 (!) 182/76  06/04/21 130/60  05/23/21 136/76   Pulse Readings from Last 3 Encounters:  07/02/21 75  06/22/21 90  06/04/21 75   Wt Readings from Last 3 Encounters:  07/02/21 266 lb (120.7 kg)  06/22/21 266 lb 3.2  oz (120.7 kg)  06/04/21 264 lb (119.7 kg)    Assessment/Interventions: Review of patient past medical history, allergies, medications, health status, including review of consultants reports, laboratory and other test data, was performed as part of comprehensive evaluation and provision of chronic care management services.   SDOH:  (Social Determinants of Health) assessments and interventions performed: No   CCM Care Plan  Allergies  Allergen Reactions   Penicillins Other (See Comments)    BLISTERS Did it involve swelling of the face/tongue/throat, SOB, or low BP? No Did it involve sudden or severe rash/hives, skin peeling, or any reaction on the inside of your mouth or nose? No Did you need to seek medical attention at a hospital or doctor's office? No When did it last happen? around 1980    If all above answers are "NO", may proceed with cephalosporin use.    Lisinopril     Dry cough, abdominal bloating and diarrhea  Codeine Rash    Medications Reviewed Today     Reviewed by Viona Gilmore, [facility] (Pharmacist) on 07/09/21 at 1211  Med List Status: <None>   Medication Order Taking? Sig Documenting Provider Last Dose Status Informant  albuterol (VENTOLIN HFA) 108 (90 Base) MCG/ACT inhaler [id number] No Inhale 2 puffs into the lungs every 6 (six) hours as needed for wheezing or shortness of breath.  Patient not taking: No sig reported   Nafziger, Tommi Rumps, NP Unknown Active   ARIPiprazole ER (ABILIFY MAINTENA) 400 MG prefilled syringe 400 mg [id number]   Arfeen, Arlyce Harman, MD  Active   ARIPiprazole ER (ABILIFY MAINTENA) 400 MG PRSY prefilled syringe [id number] No Inject 400 mg into the muscle every 28 (twenty-eight) days. [name], MD Taking Active   benztropine (COGENTIN) 0.5 MG tablet [id number] No Take 1 tablet (0.5 mg total) by mouth daily. [name], MD Taking Active   citalopram (CELEXA) 20 MG tablet [id number] No Take 1 tablet (20 mg total) by mouth daily. [name], MD  Taking Active   doxycycline (VIBRAMYCIN) 100 MG capsule [id number]  Take 1 capsule (100 mg total) by mouth 2 (two) times daily. Nafziger, Tommi Rumps, NP  Active   ezetimibe (ZETIA) 10 MG tablet [id number] No TAKE ONE TABLET BY MOUTH AT Candy Sledge, Tommi Rumps, NP Taking Active   fenofibrate (TRICOR) 145 MG tablet [id number] No Take 1 tablet (145 mg total) by mouth daily. Nafziger, Tommi Rumps, NP Taking Active   HYDROcodone bit-homatropine (HYCODAN) 5-1.5 MG/5ML syrup [id number]  Take 5 mLs by mouth every 8 (eight) hours as needed. [provider]  Active     Discontinued 07/09/21 1210 (Completed Course)   losartan (COZAAR) 50 MG tablet [id number] No TAKE ONE TABLET BY MOUTH EVERY MORNING Nafziger, Tommi Rumps, NP Taking Active   meclizine (ANTIVERT) 25 MG tablet [id number]  Take 1 tablet (25 mg total) by mouth 3 (three) times daily as needed for dizziness. Dorie Rank, MD  Active   Meclizine HCl (ANTIVERT) 25 MG CHEW [id number]  Antivert [provider]  Active   methocarbamol (ROBAXIN) 500 MG tablet [id number]  methocarbamol 500 mg tablet  Take 1 tablet every 6-8 hours by oral route as needed for spasm for 10 days. [provider]  Active   Multiple Vitamin (MULTIVITAMIN WITH MINERALS) TABS tablet [id number] No Take 1 tablet by mouth daily. [provider] Taking Active   Multiple Vitamins-Minerals (PRESERVISION AREDS 2 PO) [id number] No Take 1 capsule by mouth 2 (two) times daily. [provider] Taking Active Self  Omega-3 Fatty Acids (FISH OIL PO) [id number] No Take 1 capsule by mouth in the morning and at bedtime. [provider] Taking Active Self  pantoprazole (PROTONIX) 40 MG tablet [id number]  Take 1 tablet (40 mg total) by mouth 2 (two) times daily. Cirigliano, Vito V, DO  Active   predniSONE (DELTASONE) 10 MG tablet [id number]  40 mg x 3 days, 20 mg x 3 days, 10 mg x 3 days Nafziger, Tommi Rumps, NP  Active   temazepam (RESTORIL) 30 MG capsule [id number] No Take 1 capsule (30 mg  total) by mouth at bedtime. [name], MD Taking Active   Vitamin D, Cholecalciferol, 1000 UNITS TABS [id number] No Take 1,000 Units by mouth daily.  [provider] Taking Active Self            Patient Active Problem List   Diagnosis Date Noted   Bipolar affective disorder, currently depressed, mild ([place]) 10/21/2019   Hemorrhage of  colon following colonoscopy 10/08/2019   Adenomatous polyp of ascending colon    Tubulovillous adenoma of colon    History of colonic polyps    Diverticulosis of colon without hemorrhage    Grade I internal hemorrhoids    Essential hypertension 08/23/2016   Left bundle branch block (LBBB) on electrocardiogram 06/10/2016   Hyperlipidemia 06/07/2016   Left leg swelling 01/06/2015   Change in bowel habits 01/06/2015   Gastroesophageal reflux disease 11/28/2011   Bipolar disorder ([place]) 11/25/2011    Immunization History  Administered Date(s) Administered   Fluad Quad(high Dose 65+) 05/29/2019, 06/04/2021   Influenza Inj Mdck Quad Pf 05/29/2019   Influenza, High Dose Seasonal PF 06/07/2016, 05/15/2017   Influenza-Unspecified 07/20/2013, 06/16/2020   PFIZER Comirnaty(Gray Top)Covid-19 Tri-Sucrose Vaccine 01/29/2021   PFIZER(Purple Top)SARS-COV-2 Vaccination 10/25/2019, 11/19/2019, 06/16/2020   Pfizer Covid-19 Vaccine Bivalent Booster 26yr & up 06/09/2021   Pneumococcal Conjugate-13 03/11/2015   Pneumococcal Polysaccharide-23 07/06/2010   Tdap 07/06/2010, 10/29/2015   Zoster Recombinat (Shingrix) 01/29/2021, 05/18/2021   Patient has had a lot going on lately. The biggest concern is his cough that will not go away. It is non productive and the antibiotics helped some but not enough.   He also recent broke his shoulder after a bout of vertigo. He has been doing physical therapy but has a very limited range of motion and has not been using the pain medication. He has broken several bones over his lifetime.  Conditions to be  addressed/monitored:  Hypertension, Hyperlipidemia, Diabetes, GERD, Allergic Rhinitis and insomnia, bipolar disorder  Conditions addressed this visit: Hypertension, hyperlipidemia  Care Plan : CCM Pharmacy Care Plan  Updates made by PViona Gilmore RCordovasince 07/09/2021 12:00 AM     Problem: Problem: Hypertension, Hyperlipidemia, Diabetes, GERD, Allergic Rhinitis and insomnia, bipolar disorder      Long-Range Goal: Patient-Specific Goal   Start Date: 11/11/2020  Expected End Date: 11/11/2021  Recent Progress: On track  Priority: High  Note:   Current Barriers:  Unable to independently monitor therapeutic efficacy Does not contact provider office for questions/concerns  Pharmacist Clinical Goal(s):  Patient will achieve adherence to monitoring guidelines and medication adherence to achieve therapeutic efficacy through collaboration with PharmD and provider.    Interventions: 1:1 collaboration with NDorothyann Peng NP regarding development and update of comprehensive plan of care as evidenced by provider attestation and co-signature Inter-disciplinary care team collaboration (see longitudinal plan of care) Comprehensive medication review performed; medication list updated in electronic medical record  Hypertension (BP goal <140/90) -Uncontrolled -Current treatment: Losartan 50 mg 1 tablet daily  -Medications previously tried: lisinopril (cough) -Current home readings: 130/70-80; 168 (in office) - arm cuff -Current dietary habits:  tries to avoid using salt with foods; drinks 1-2 cups of coffee in the morning -Current exercise habits:  patient does not do any structured exercise due to shoulder pain and double knee replacements; patient can walk and would like to start being more active -Denies hypotensive/hypertensive symptoms -Educated on BP goals and benefits of medications for prevention of heart attack, stroke and kidney damage; Exercise goal of 150 minutes per  week; Importance of home blood pressure monitoring; -Counseled to monitor BP at home weekly, document, and provide log at future appointments -Counseled on diet and exercise extensively Recommended to continue current medication Recommended setting a goal for exercise he is able to accomplish such as 15 minutes 2-3 days a week Recommended bringing home BP monitor to next office visit  Hyperlipidemia: (LDL goal < 100) -Controlled;  Uncontrolled triglycerides -Current treatment: Ezetimibe 10 mg 1 tablet daily Fish oil 1000 mg capsule twice daily Fenofibrate 160 mg 1 tablet daily -Medications previously tried: statins  -Current dietary patterns: tries to avoid fatty foods; eating more sweets lately and does not drink alcohol -Current exercise habits: patient does not do any structured exercise due to shoulder pain and double knee replacements; patient can walk and would like to start being more active -Educated on Cholesterol goals;  Importance of limiting foods high in cholesterol; Exercise goal of 150 minutes per week; -Recommended to continue current medication Recommended repeat lipid panel  Pre-diabetes (A1c goal <6.5%) -Controlled -Current medications: No medications -Medications previously tried: none  -Current home glucose readings fasting glucose: none post prandial glucose: none -Denies hypoglycemic/hyperglycemic symptoms -Current meal patterns:  breakfast: did not discuss  lunch: did not discuss  dinner: did not discuss snacks: did not discuss drinks: did not discuss -Current exercise: patient does not do any structured exercise due to shoulder pain and double knee replacements; patient can walk and would like to start being more active -Educated onCarbohydrate counting and/or plate method -Counseled to check feet daily and get yearly eye exams -Counseled on diet and exercise extensively Provided healthy plate handout  GERD (Goal: minimize symptoms of heartburn/acid  reflux) -Controlled -Current treatment  Pantoprazole 61m, 1 tablet twice daily -Medications previously tried: none  -Recommended to continue current medication  Bipolar disorder (Goal: minimize symptoms) -Controlled -Current treatment  Aripiprazole ER 4010m inject 40035mvery twenty-eight days  Citalopram 67m31m tablet once daily -Medications previously tried: unknown  -Recommended to continue current medication  Sinus congestion (Goal: minimize symptoms of congestion) -Uncontrolled -Current treatment  Fluticasone nasal spray, 1 spray into both nostrils in the morning and at bedtime   Zyrtec 10 mg daily -Medications previously tried: chlopheniramine (switched to safer alternative Zyrtec) -Recommended to continue current medication  Insomnia (Goal: improve quality and quantity of sleep) -Controlled -Current treatment  Temazepam 30mg18mcapsule at bedtime  -Medications previously tried: several medications, trazodone (ineffective) -Recommended to continue current medication Counseled on practicing good sleep hygiene by setting a sleep schedule and maintaining it, avoid excessive napping, following a nightly routine, avoiding screen time for 30-60 minutes before going to bed, and making the bedroom a cool, quiet and dark space  Recommended trial of melatonin timed release to help with staying asleep  Health Maintenance -Vaccine gaps: none -Current therapy:  Multivitamin, 1 tablet once daily Preservision AREDS 2, 1 capsule twice daily Vitamin D3, 1,000 units once daily  -Educated on Cost vs benefit of each product must be carefully weighed by individual consumer -Patient is satisfied with current therapy and denies issues -Recommended to continue current medication   Patient Goals/Self-Care Activities Patient will:  - take medications as prescribed check blood pressure weekly, document, and provide at future appointments target a minimum of 150 minutes of moderate  intensity exercise weekly  Follow Up Plan: Telephone follow up appointment with care management team member scheduled for: 4 months       Medication Assistance: None required.  Patient affirms current coverage meets needs.  Compliance/Adherence/Medication fill history: Care Gaps: Covid-19 vaccine booster 5 - overdue since 03/26/21 Last PCP BP: 130/60 P: 75   Star[facility]: Losartan 50mg 18mst filled on 05/10/21 90DS at [facility]  Patient's preferred pharmacy is:  [facility] [place] [address] [place] [address]: [phone] pill  box? No - adherence packaging Pt endorses 100% compliance  We discussed: Benefits of medication synchronization, packaging and delivery as well as enhanced pharmacist oversight with [facility]. Patient decided to: Utilize [facility] pharmacy for medication synchronization, packaging and delivery  Coordinated acute fill for benztropine and melatonin.  Care Plan and Follow Up Patient Decision:  Patient agrees to Care Plan and Follow-up.  Plan: The care management team will reach out to the patient again over the next 30 days.  Jeni Salles, PharmD [facility] Clinical Pharmacist [place] [facility] [place]

## 2021-07-12 NOTE — Telephone Encounter (Signed)
Noted  

## 2021-07-13 ENCOUNTER — Other Ambulatory Visit: Payer: Self-pay | Admitting: Adult Health

## 2021-07-13 DIAGNOSIS — E7849 Other hyperlipidemia: Secondary | ICD-10-CM

## 2021-07-13 NOTE — Telephone Encounter (Signed)
See phone note

## 2021-07-15 DIAGNOSIS — M25512 Pain in left shoulder: Secondary | ICD-10-CM | POA: Diagnosis not present

## 2021-07-19 DIAGNOSIS — I10 Essential (primary) hypertension: Secondary | ICD-10-CM

## 2021-07-19 DIAGNOSIS — E7849 Other hyperlipidemia: Secondary | ICD-10-CM | POA: Diagnosis not present

## 2021-07-21 ENCOUNTER — Ambulatory Visit (HOSPITAL_BASED_OUTPATIENT_CLINIC_OR_DEPARTMENT_OTHER): Payer: Medicare HMO

## 2021-07-21 ENCOUNTER — Encounter (HOSPITAL_COMMUNITY): Payer: Self-pay

## 2021-07-21 ENCOUNTER — Other Ambulatory Visit: Payer: Self-pay

## 2021-07-21 VITALS — BP 147/74 | HR 77 | Temp 100.0°F | Ht 70.0 in | Wt 264.4 lb

## 2021-07-21 DIAGNOSIS — F319 Bipolar disorder, unspecified: Secondary | ICD-10-CM

## 2021-07-21 DIAGNOSIS — R69 Illness, unspecified: Secondary | ICD-10-CM | POA: Diagnosis not present

## 2021-07-21 NOTE — Patient Instructions (Addendum)
Patient presented today for due Abilify Maintena 400mg  injection. Patient was pleasant and cooperative upon approach. Medication prepared and given as ordered in [place] without complaint. Patient to return in 4 weeks for next due injection

## 2021-07-22 DIAGNOSIS — M25512 Pain in left shoulder: Secondary | ICD-10-CM | POA: Diagnosis not present

## 2021-07-23 ENCOUNTER — Telehealth: Payer: Self-pay | Admitting: Pharmacist

## 2021-07-23 NOTE — Telephone Encounter (Signed)
Called patient to schedule a lab visit for repeat lipid panel. Patient scheduled for Mon Nov 7th and is aware to arrive fasting.

## 2021-07-26 ENCOUNTER — Other Ambulatory Visit: Payer: Self-pay

## 2021-07-26 ENCOUNTER — Other Ambulatory Visit (INDEPENDENT_AMBULATORY_CARE_PROVIDER_SITE_OTHER): Payer: Medicare HMO

## 2021-07-26 DIAGNOSIS — E7849 Other hyperlipidemia: Secondary | ICD-10-CM | POA: Diagnosis not present

## 2021-07-26 LAB — LDL CHOLESTEROL, DIRECT: Direct LDL: 112 mg/dL

## 2021-07-26 LAB — LIPID PANEL
Cholesterol: 195 mg/dL (ref 0–200)
HDL: 34.5 mg/dL — ABNORMAL LOW (ref >39.00)
NonHDL: 160.14
Total CHOL/HDL Ratio: 6
Triglycerides: 354 mg/dL — ABNORMAL HIGH (ref 0.0–149.0)
VLDL: 70.8 mg/dL — ABNORMAL HIGH (ref 0.0–40.0)

## 2021-07-29 DIAGNOSIS — M25512 Pain in left shoulder: Secondary | ICD-10-CM | POA: Diagnosis not present

## 2021-07-30 ENCOUNTER — Telehealth: Payer: Self-pay | Admitting: Pharmacist

## 2021-07-30 NOTE — Chronic Care Management (AMB) (Signed)
Chronic Care Management Pharmacy Assistant   Name: Sean Hill  MRN: [id number] DOB: April 06, 1941  Reason for Encounter: Medication Review Medication Coordination and HTN assessment   Conditions to be addressed/monitored: HTN  Recent office visits:  None  Recent consult visits:  None   Hospital visits:  Medication Reconciliation was completed by comparing discharge summary, patient's EMR and Pharmacy list, and upon discussion with patient.  Patient presented to [facility] ED on 05/23/21 due to vertigo and dizziness. He was there for 6 hours.   New?Medications Started at [facility] Discharge:?? -started  HYDROcodone-acetaminophen 5-325 MG meclizine 25 MG tablet  Medication Changes at Hospital Discharge: -Changed None  Medications Discontinued at Hospital Discharge: -Stopped None  Medications that remain the same after Hospital Discharge:??  -All other medications will remain the same.    Medications: Outpatient Encounter Medications as of 07/30/2021  Medication Sig   albuterol (VENTOLIN HFA) 108 (90 Base) MCG/ACT inhaler Inhale 2 puffs into the lungs every 6 (six) hours as needed for wheezing or shortness of breath. (Patient not taking: No sig reported)   ARIPiprazole ER (ABILIFY MAINTENA) 400 MG PRSY prefilled syringe Inject 400 mg into the muscle every 28 (twenty-eight) days.   benztropine (COGENTIN) 0.5 MG tablet Take 1 tablet (0.5 mg total) by mouth daily.   citalopram (CELEXA) 20 MG tablet Take 1 tablet (20 mg total) by mouth daily.   ezetimibe (ZETIA) 10 MG tablet TAKE ONE TABLET BY MOUTH AT NOON   fenofibrate (TRICOR) 145 MG tablet Take 1 tablet (145 mg total) by mouth daily.   HYDROcodone bit-homatropine (HYCODAN) 5-1.5 MG/5ML syrup Take 5 mLs by mouth every 8 (eight) hours as needed.   losartan (COZAAR) 50 MG tablet TAKE ONE TABLET BY MOUTH EVERY MORNING   meclizine (ANTIVERT) 25 MG tablet Take 1 tablet (25 mg total) by mouth 3 (three)  times daily as needed for dizziness.   Meclizine HCl (ANTIVERT) 25 MG CHEW Antivert   methocarbamol (ROBAXIN) 500 MG tablet methocarbamol 500 mg tablet  Take 1 tablet every 6-8 hours by oral route as needed for spasm for 10 days.   Multiple Vitamin (MULTIVITAMIN WITH MINERALS) TABS tablet Take 1 tablet by mouth daily.   Multiple Vitamins-Minerals (PRESERVISION AREDS 2 PO) Take 1 capsule by mouth 2 (two) times daily.   Omega-3 Fatty Acids (FISH OIL PO) Take 1 capsule by mouth in the morning and at bedtime.   pantoprazole (PROTONIX) 40 MG tablet Take 1 tablet (40 mg total) by mouth 2 (two) times daily.   predniSONE (DELTASONE) 20 MG tablet Take 1 tablet (20 mg total) by mouth daily with breakfast.   temazepam (RESTORIL) 30 MG capsule Take 1 capsule (30 mg total) by mouth at bedtime.   Vitamin D, Cholecalciferol, 1000 UNITS TABS Take 1,000 Units by mouth daily.    Facility-Administered Encounter Medications as of 07/30/2021  Medication   ARIPiprazole ER (ABILIFY MAINTENA) 400 MG prefilled syringe 400 mg   Reviewed chart for medication changes ahead of medication coordination call.  No OVs, Consults, or hospital visits since last care coordination call/Pharmacist visit.   No medication changes indicated OR if recent visit, treatment plan here.  BP Readings from Last 3 Encounters:  06/04/21 130/60  05/23/21 136/76  04/29/21 110/70    Lab Results  Component Value Date   HGBA1C 6.0 04/29/2021     Patient obtains medications through Adherence Packaging  90 Days   Last adherence delivery included: Pantoprazole (PROTONIX) 40 mg: one  tablet at breakfast and at dinner  Preservision Areds-2gt: one at breakfast Certavite senior- one at breakfast Vitamin D3 25 Mcg: one tablet at breakfast Fish oil 1000 - take one at breakfast and one at evening meal Cetirizine 10 mg: one tablet at breakfast Ezetimibe (ZETIA) 10 mg: one tablet at lunch Temazepam (RESTORIL) 30 mg: one capsule at  bedtime Losartan (COZAAR) 50 MG tablet: one tablet at breakfast Citalopram (CELEXA) 20 mg: one tablet at breakfast Benztropine (COGENTIN) 0.5 mg: one tablet at Breakfast Fenofibrate 145 mg: one tablet at breakfast   Patient is due for next adherence delivery on: 08/11/21. Called patient and reviewed medications and coordinated delivery. Confirmed packages for 90 DS    This delivery to include: Certavite senior- one at breakfast Cetirizine 10 mg: one tablet at breakfast Citalopram (CELEXA) 20 mg: one tablet at breakfast Fenofibrate 145 mg: one tablet at breakfast Fish oil 1000 - take one at breakfast and one at evening meal Losartan (COZAAR) 50 MG tablet: one tablet at breakfast Preservision Areds-2gt: one at breakfast and one at bedtime Vitamin D3 1000 u: one tablet at breakfast Benztropine (COGENTIN) 0.5 mg: one tablet at lunch Ezetimibe (ZETIA) 10 mg: one tablet at lunch Temazepam (RESTORIL) 30 mg: one capsule at bedtime Pantoprazole (Protonix) 40 mg : one tab at breakfast and one at dinner  Spoke with patients wife for call: Confirmed delivery date of 08/11/21, advised patient that pharmacy will contact him the morning of delivery.    Reviewed chart prior to disease state call. Spoke with patient regarding BP  Recent Office Vitals: BP Readings from Last 3 Encounters:  06/04/21 130/60  05/23/21 136/76  04/29/21 110/70   Pulse Readings from Last 3 Encounters:  07/02/21 75  06/04/21 75  05/23/21 85    Wt Readings from Last 3 Encounters:  07/02/21 266 lb (120.7 kg)  06/04/21 264 lb (119.7 kg)  05/23/21 260 lb (117.9 kg)     Kidney Function Lab Results  Component Value Date/Time   CREATININE 0.88 05/23/2021 06:27 PM   CREATININE 0.84 04/29/2021 11:06 AM   CREATININE 1.00 09/24/2019 12:21 PM   CREATININE 0.88 12/03/2014 03:47 PM   GFR 83.60 04/29/2021 11:06 AM   GFRNONAA >60 05/23/2021 06:27 PM   GFRNONAA 73 09/24/2019 12:21 PM   GFRAA >60 10/09/2019 05:00 AM    GFRAA 84 09/24/2019 12:21 PM    BMP Latest Ref Rng & Units 05/23/2021 04/29/2021 08/07/2020  Glucose 70 - 99 mg/dL 180(H) 95 108(H)  BUN 8 - 23 mg/dL 12 9 11   Creatinine 0.61 - 1.24 mg/dL 0.88 0.84 0.81  BUN/Creat Ratio 6 - 22 (calc) - - -  Sodium 135 - 145 mmol/L 142 140 136  Potassium 3.5 - 5.1 mmol/L 3.7 4.3 4.4  Chloride 98 - 111 mmol/L 107 103 102  CO2 22 - 32 mmol/L 25 27 25   Calcium 8.9 - 10.3 mg/dL 9.5 9.7 9.0  Spoke to wife for call  Current antihypertensive regimen:  Losartan 50 mg 1 tablet daily How often are you checking your Blood Pressure? Wife reports he has not been checking at home. Current home BP readings: 130/60 in office sept, she reports he has not been complaining of any headaches or symptoms of blood pressure elevated he has been taking his medication. What recent interventions/DTPs have been made by any provider to improve Blood Pressure control since last CPP Visit: She reports none Any recent hospitalizations or ED visits since last visit with CPP? No  Adherence Review: Is  the patient currently on ACE/ARB medication? Yes Does the patient have >5 day gap between last estimated fill dates? No   Care Gaps: CCM F/U Call - 3/23 AWV - 1/22 BP-130/60 (9/22)  Star Rating Drugs: Losartan (Cozaar) 50 mg - Last filled 05/10/2021 90 DS at [facility] Pharmacist Assistant [phone]

## 2021-07-31 ENCOUNTER — Other Ambulatory Visit (HOSPITAL_COMMUNITY): Payer: Self-pay | Admitting: Psychiatry

## 2021-07-31 DIAGNOSIS — F411 Generalized anxiety disorder: Secondary | ICD-10-CM

## 2021-07-31 DIAGNOSIS — F319 Bipolar disorder, unspecified: Secondary | ICD-10-CM

## 2021-08-02 ENCOUNTER — Other Ambulatory Visit (HOSPITAL_COMMUNITY): Payer: Self-pay | Admitting: *Deleted

## 2021-08-02 NOTE — Patient Instructions (Signed)
DUE TO COVID-19 ONLY ONE VISITOR IS ALLOWED TO COME WITH YOU AND STAY IN THE WAITING ROOM ONLY DURING PRE OP AND PROCEDURE.   **NO VISITORS ARE ALLOWED IN THE SHORT STAY AREA OR RECOVERY ROOM!!**  IF YOU WILL BE ADMITTED INTO THE HOSPITAL YOU ARE ALLOWED ONLY TWO SUPPORT PEOPLE DURING VISITATION HOURS ONLY (7 AM -8PM)   The support person(s) must pass our screening, gel in and out, and wear a mask at all times, including in the patient's room. Patients must also wear a mask when staff or their support person are in the room. Visitors GUEST BADGE MUST BE WORN VISIBLY  One adult visitor may remain with you overnight and MUST be in the room by 8 P.M.  No visitors under the age of 74. Any visitor under the age of 75 must be accompanied by an adult.    COVID SWAB TESTING MUST BE COMPLETED ON:  08/23/21 **MUST PRESENT COMPLETED FORM AT TESTING SITE**    [place] [place] (backside of the building) You are not required to quarantine, however you are required to wear a well-fitted mask when you are out and around people not in your household.  Hand Hygiene often Do NOT share personal items Notify your provider if you are in close contact with someone who has COVID or you develop fever 100.4 or greater, new onset of sneezing, cough, sore throat, shortness of breath or body aches.  [address], [address], must go inside of the hospital, NOT A DRIVE THRU!  (Must self quarantine after testing. Follow instructions on handout.)       Your procedure is scheduled on: 08/25/21   Report to [facility] Main Entrance    Report to short stay at : 5:45 AM   Call this number if you have problems the morning of surgery [phone]   Do not eat food :After Midnight.   May have liquids until : 5:30 AM   day of surgery  CLEAR LIQUID DIET  Foods Allowed                                                                      Foods Excluded  Water, Black Coffee and tea, regular and decaf                             liquids that you cannot  Plain Jell-O in any flavor  (No red)                                           see through such as: Fruit ices (not with fruit pulp)                                     milk, soups, orange juice              Iced Popsicles (No red)  All solid food                                   Apple juices Sports drinks like Gatorade (No red) Lightly seasoned clear broth or consume(fat free) Sugar Sample Menu Breakfast                                Lunch                                     Supper Cranberry juice                    Beef broth                            Chicken broth Jell-O                                     Grape juice                           Apple juice Coffee or tea                        Jell-O                                      Popsicle                                                Coffee or tea                        Coffee or tea      Complete one Ensure drink the morning of surgery at: 5:30 AM       the day of surgery.    The day of surgery:  Drink ONE (1) Pre-Surgery Clear Ensure or G2 by am the morning of surgery. Drink in one sitting. Do not sip.  This drink was given to you during your hospital  pre-op appointment visit. Nothing else to drink after completing the  Pre-Surgery Clear Ensure or G2.          If you have questions, please contact your surgeon's office.     Oral Hygiene is also important to reduce your risk of infection.                                    Remember - BRUSH YOUR TEETH THE MORNING OF SURGERY WITH YOUR REGULAR TOOTHPASTE   Do NOT smoke after Midnight   Take these medicines the morning of surgery with A SIP OF WATER: citalopram,benztropine,aripiprazole,prednisone,pantoprazole.                              You  may not have any metal on your body including hair pins, jewelry, and body  piercing             Do not wear lotions, powders, perfumes/cologne, or deodorant              Men may shave face and neck.   Do not bring valuables to the hospital. [place].   Contacts, dentures or bridgework may not be worn into surgery.   Bring small overnight bag day of surgery.    Patients discharged on the day of surgery will not be allowed to drive home.   Special Instructions: Bring a copy of your healthcare power of attorney and living will documents         the day of surgery if you haven't scanned them before.              Please read over the following fact sheets you were given: IF YOU HAVE QUESTIONS ABOUT YOUR PRE-OP INSTRUCTIONS PLEASE CALL [phone]     [facility] - Preparing for Surgery Before surgery, you can play an important role.  Because skin is not sterile, your skin needs to be as free of germs as possible.  You can reduce the number of germs on your skin by washing with CHG (chlorahexidine gluconate) soap before surgery.  CHG is an antiseptic cleaner which kills germs and bonds with the skin to continue killing germs even after washing. Please DO NOT use if you have an allergy to CHG or antibacterial soaps.  If your skin becomes reddened/irritated stop using the CHG and inform your nurse when you arrive at Short Stay. Do not shave (including legs and underarms) for at least 48 hours prior to the first CHG shower.  You may shave your face/neck. Please follow these instructions carefully:  1.  Shower with CHG Soap the night before surgery and the  morning of Surgery.  2.  If you choose to wash your hair, wash your hair first as usual with your  normal  shampoo.  3.  After you shampoo, rinse your hair and body thoroughly to remove the  shampoo.                           4.  Use CHG as you would any other liquid soap.  You can apply chg directly  to the skin and wash                       Gently with a scrungie or  clean washcloth.  5.  Apply the CHG Soap to your body ONLY FROM THE NECK DOWN.   Do not use on face/ open                           Wound or open sores. Avoid contact with eyes, ears mouth and genitals (private parts).                       Wash face,  Genitals (private parts) with your normal soap.             6.  Wash thoroughly, paying special attention to the area where your surgery  will be performed.  7.  Thoroughly rinse your body with warm water  from the neck down.  8.  DO NOT shower/wash with your normal soap after using and rinsing off  the CHG Soap.                9.  Pat yourself dry with a clean towel.            10.  Wear clean pajamas.            11.  Place clean sheets on your bed the night of your first shower and do not  sleep with pets. Day of Surgery : Do not apply any lotions/deodorants the morning of surgery.  Please wear clean clothes to the hospital/surgery center.  FAILURE TO FOLLOW THESE INSTRUCTIONS MAY RESULT IN THE CANCELLATION OF YOUR SURGERY PATIENT SIGNATURE_________________________________  NURSE SIGNATURE__________________________________  ________________________________________________________________________   Sean Hill  An incentive spirometer is a tool that can help keep your lungs clear and active. This tool measures how well you are filling your lungs with each breath. Taking long deep breaths may help reverse or decrease the chance of developing breathing (pulmonary) problems (especially infection) following: A long period of time when you are unable to move or be active. BEFORE THE PROCEDURE  If the spirometer includes an indicator to show your best effort, your nurse or respiratory therapist will set it to a desired goal. If possible, sit up straight or lean slightly forward. Try not to slouch. Hold the incentive spirometer in an upright position. INSTRUCTIONS FOR USE  Sit on the edge of your bed if possible, or sit up as far as you  can in bed or on a chair. Hold the incentive spirometer in an upright position. Breathe out normally. Place the mouthpiece in your mouth and seal your lips tightly around it. Breathe in slowly and as deeply as possible, raising the piston or the ball toward the top of the column. Hold your breath for 3-5 seconds or for as long as possible. Allow the piston or ball to fall to the bottom of the column. Remove the mouthpiece from your mouth and breathe out normally. Rest for a few seconds and repeat Steps 1 through 7 at least 10 times every 1-2 hours when you are awake. Take your time and take a few normal breaths between deep breaths. The spirometer may include an indicator to show your best effort. Use the indicator as a goal to work toward during each repetition. After each set of 10 deep breaths, practice coughing to be sure your lungs are clear. If you have an incision (the cut made at the time of surgery), support your incision when coughing by placing a pillow or rolled up towels firmly against it. Once you are able to get out of bed, walk around indoors and cough well. You may stop using the incentive spirometer when instructed by your caregiver.  RISKS AND COMPLICATIONS Take your time so you do not get dizzy or light-headed. If you are in pain, you may need to take or ask for pain medication before doing incentive spirometry. It is harder to take a deep breath if you are having pain. AFTER USE Rest and breathe slowly and easily. It can be helpful to keep track of a log of your progress. Your caregiver can provide you with a simple table to help with this. If you are using the spirometer at home, follow these instructions: [place] IF:  You are having difficultly using the spirometer. You have trouble using the spirometer as often  as instructed. Your pain medication is not giving enough relief while using the spirometer. You develop fever of 100.5 F (38.1 C) or higher. SEEK  IMMEDIATE MEDICAL CARE IF:  You cough up bloody sputum that had not been present before. You develop fever of 102 F (38.9 C) or greater. You develop worsening pain at or near the incision site. MAKE SURE YOU:  Understand these instructions. Will watch your condition. Will get help right away if you are not doing well or get worse. Document Released: 01/16/2007 Document Revised: 11/28/2011 Document Reviewed: 03/19/2007 [facility] Patient Information 2014 [place], [place].   ________________________________________________________________________

## 2021-08-02 NOTE — Progress Notes (Signed)
Surgery orders requested.

## 2021-08-05 ENCOUNTER — Other Ambulatory Visit: Payer: Self-pay

## 2021-08-05 ENCOUNTER — Encounter (HOSPITAL_COMMUNITY): Payer: Self-pay | Admitting: Psychiatry

## 2021-08-05 ENCOUNTER — Telehealth (HOSPITAL_BASED_OUTPATIENT_CLINIC_OR_DEPARTMENT_OTHER): Payer: Medicare HMO | Admitting: Psychiatry

## 2021-08-05 DIAGNOSIS — F319 Bipolar disorder, unspecified: Secondary | ICD-10-CM

## 2021-08-05 DIAGNOSIS — R69 Illness, unspecified: Secondary | ICD-10-CM | POA: Diagnosis not present

## 2021-08-05 DIAGNOSIS — F411 Generalized anxiety disorder: Secondary | ICD-10-CM | POA: Diagnosis not present

## 2021-08-05 MED ORDER — CITALOPRAM HYDROBROMIDE 20 MG PO TABS: 20.0000 mg | ORAL_TABLET | Freq: Every day | ORAL | 0 refills | Status: DC

## 2021-08-05 MED ORDER — ARIPIPRAZOLE ER 400 MG IM PRSY: 400.0000 mg | PREFILLED_SYRINGE | INTRAMUSCULAR | 2 refills | Status: DC

## 2021-08-05 MED ORDER — TEMAZEPAM 30 MG PO CAPS
30.0000 mg | ORAL_CAPSULE | Freq: Every day | ORAL | 0 refills | Status: DC
Start: 2021-08-05 — End: 2021-10-28

## 2021-08-05 MED ORDER — BENZTROPINE MESYLATE 0.5 MG PO TABS: 0.5000 mg | ORAL_TABLET | Freq: Every day | ORAL | 0 refills | Status: DC

## 2021-08-05 NOTE — Progress Notes (Signed)
Virtual Visit via Telephone Note  I connected with Sean Hill on 08/05/21 at  2:40 PM EST by telephone and verified that I am speaking with the correct person using two identifiers.  Location: Patient: Home Provider: Office   I discussed the limitations, risks, security and privacy concerns of performing an evaluation and management service by telephone and the availability of in person appointments. I also discussed with the patient that there may be a patient responsible charge related to this service. The patient expressed understanding and agreed to proceed.   History of Present Illness: Patient is evaluated by phone session.  He is taking temazepam which helps at least 5 to 6 hours of sleep.  He is getting injection once a month but he is keeping his mood stable.  He denies any mania, psychosis or any hallucination.  Lately he have COVID like symptoms and he was given steroids but he is feeling better.  He has upcoming procedure for his right knee.  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  He has difficulty walking.  Denies any suicidal thoughts or homicidal thoughts.  He has no tremors or shakes.  He lives with his wife who is supportive.  Recently he had a blood work and shows high triglycerides and LDL.  He had to follow up with his physician.  Since we have discontinued the trazodone and cut down the Cogentin his dry mouth and constipation is much better.  He denies any panic attack and overall he feels his symptoms are manageable when he go to crowded places.  Past Psychiatric History:  H/O multiple hospitalization.  Last inpatient in July 2014. H/O overdose on Latuda with alcohol. H/O cutting wrist.  Took Tegretol, Depakote, Ambien, Remeron, Vistaril, Geodon, Abilify, lithium, Provigil, Zoloft, Neurontin, Lamictal, Wellbutrin, Risperdal and Pristiq. H/O mania, aggression, getting speeding tickets, excessive  buying and impulsive behavior.  Recent Results (from the past 2160 hour(s))  Basic metabolic panel     Status: Abnormal   Collection Time: 05/23/21  6:27 PM  Result Value Ref Range   Sodium 142 135 - 145 mmol/L   Potassium 3.7 3.5 - 5.1 mmol/L   Chloride 107 98 - 111 mmol/L   CO2 25 22 - 32 mmol/L   Glucose, Bld 180 (H) 70 - 99 mg/dL    Comment: Glucose reference range applies only to samples taken after fasting for at least 8 hours.   BUN 12 8 - 23 mg/dL   Creatinine, Ser 0.88 0.61 - 1.24 mg/dL   Calcium 9.5 8.9 - 10.3 mg/dL   GFR, Estimated >60 >60 mL/min    Comment: (NOTE) Calculated using the CKD-EPI Creatinine Equation (2021)    Anion gap 10 5 - 15    Comment: Performed at [facility], [place] [address], [place], [place] [address]  CBC WITH DIFFERENTIAL     Status: None   Collection Time: 05/23/21  6:27 PM  Result Value Ref Range   WBC 7.2 4.0 - 10.5 K/uL   RBC 5.14 4.22 - 5.81 MIL/uL   Hemoglobin 15.4 13.0 - 17.0 g/dL   HCT 44.7 39.0 - 52.0 %   MCV 87.0 80.0 - 100.0 fL   MCH 30.0 26.0 - 34.0 pg   MCHC 34.5 30.0 - 36.0 g/dL   RDW 14.1 11.5 - 15.5 %   Platelets 215 150 - 400 K/uL   nRBC 0.0 0.0 - 0.2 %  Neutrophils Relative % 72 %   Neutro Abs 5.3 1.7 - 7.7 K/uL   Lymphocytes Relative 18 %   Lymphs Abs 1.3 0.7 - 4.0 K/uL   Monocytes Relative 8 %   Monocytes Absolute 0.6 0.1 - 1.0 K/uL   Eosinophils Relative 1 %   Eosinophils Absolute 0.0 0.0 - 0.5 K/uL   Basophils Relative 0 %   Basophils Absolute 0.0 0.0 - 0.1 K/uL   Immature Granulocytes 1 %   Abs Immature Granulocytes 0.04 0.00 - 0.07 K/uL    Comment: Performed at [facility], [place] [address], [place], [place] [address]  Troponin I (High Sensitivity)     Status: None   Collection Time: 05/23/21  6:27 PM  Result Value Ref Range   Troponin I (High Sensitivity) 5 <18 ng/L    Comment: (NOTE) Elevated high sensitivity troponin I (hsTnI) values and significant  changes across  serial measurements may suggest ACS but many other  chronic and acute conditions are known to elevate hsTnI results.  Refer to the "Links" section for chest pain algorithms and additional  guidance. Performed at [facility], [place] [address] [place], [place] [address]   CBG monitoring, ED     Status: Abnormal   Collection Time: 05/23/21  6:45 PM  Result Value Ref Range   Glucose-Capillary 176 (H) 70 - 99 mg/dL    Comment: Glucose reference range applies only to samples taken after fasting for at least 8 hours.  Lipid panel     Status: Abnormal   Collection Time: 07/26/21  8:53 AM  Result Value Ref Range   Cholesterol 195 0 - 200 mg/dL    Comment: ATP III Classification       Desirable:  < 200 mg/dL               Borderline High:  200 - 239 mg/dL          High:  > = 240 mg/dL   Triglycerides 354.0 (H) 0.0 - 149.0 mg/dL    Comment: Normal:  <150 mg/dLBorderline High:  150 - 199 mg/dL   HDL 34.50 (L) >39.00 mg/dL   VLDL 70.8 (H) 0.0 - 40.0 mg/dL   Total CHOL/HDL Ratio 6     Comment:                Men          Women1/2 Average Risk     3.4          3.3Average Risk          5.0          4.42X Average Risk          9.6          7.13X Average Risk          15.0          11.0                       NonHDL 160.14     Comment: NOTE:  Non-HDL goal should be 30 mg/dL higher than patient's LDL goal (i.e. LDL goal of < 70 mg/dL, would have non-HDL goal of < 100 mg/dL)  LDL cholesterol, direct     Status: None   Collection Time: 07/26/21  8:53 AM  Result Value Ref Range   Direct LDL 112.0 mg/dL    Comment: Optimal:  <100 mg/dLNear or Above Optimal:  100-129 mg/dLBorderline High:  130-159 mg/dLHigh:  160-189 mg/dLVery High:  >190 mg/dL     Psychiatric Specialty Exam: Physical Exam  Review of Systems  Weight 264 lb (119.7 kg).There is no height or weight on file to calculate BMI.  General Appearance: NA  Eye Contact:  NA  Speech:  Slow  Volume:  Decreased  Mood:  Euthymic   Affect:  NA  Thought Process:  Goal Directed  Orientation:  Full (Time, Place, and Person)  Thought Content:  WDL  Suicidal Thoughts:  No  Homicidal Thoughts:  No  Memory:  Immediate;   Fair Recent;   Fair Remote;   Fair  Judgement:  Intact  Insight:  Present  Psychomotor Activity:  NA  Concentration:  Concentration: Fair and Attention Span: Fair  Recall:  [employer] of Knowledge:  Good  Language:  Good  Akathisia:  No  Handed:  Right  AIMS (if indicated):     Assets:  Communication Skills Desire for Improvement Housing Resilience Social Support Transportation  ADL's:  Intact  Cognition:  WNL  Sleep:   better with restoril      Assessment and Plan: Bipolar disorder type I.  Generalized anxiety disorder.  I reviewed blood work results.  Patient has to follow up with his physician due to high triglycerides and LDL.  He is sleeping better with temazepam but his pharmacy does not take the fax and he has to send her a copy to the pharmacy.  He preferred to send his temazepam to local pharmacy at [facility] and other medication he is okay to [facility].  He does not want to change the medication.  We will continue Cogentin 0.5 mg at bedtime, Celexa 20 mg daily and Abilify 400 mg intramuscular every 4 weeks.  We will continue temazepam 30 mg at bedtime and sent to local pharmacy.  Recommended to call us back if is any question or any concern.  Encouraged to continue therapy with Ivin Booty once a month.  Follow up in 3 months.  Follow Up Instructions:    I discussed the assessment and treatment plan with the patient. The patient was provided an opportunity to ask questions and all were answered. The patient agreed with the plan and demonstrated an understanding of the instructions.   The patient was advised to call back or seek an in-person evaluation if the symptoms worsen or if the condition fails to improve as anticipated.  I provided 19 minutes of non-face-to-face time  during this encounter.   [name], MD

## 2021-08-06 ENCOUNTER — Other Ambulatory Visit (HOSPITAL_COMMUNITY): Payer: Self-pay | Admitting: Psychiatry

## 2021-08-06 DIAGNOSIS — F319 Bipolar disorder, unspecified: Secondary | ICD-10-CM

## 2021-08-06 DIAGNOSIS — M25512 Pain in left shoulder: Secondary | ICD-10-CM | POA: Diagnosis not present

## 2021-08-09 ENCOUNTER — Other Ambulatory Visit (HOSPITAL_COMMUNITY): Payer: Self-pay | Admitting: Psychiatry

## 2021-08-09 DIAGNOSIS — F319 Bipolar disorder, unspecified: Secondary | ICD-10-CM

## 2021-08-10 ENCOUNTER — Other Ambulatory Visit (HOSPITAL_COMMUNITY): Payer: Self-pay | Admitting: Psychiatry

## 2021-08-10 DIAGNOSIS — M25512 Pain in left shoulder: Secondary | ICD-10-CM | POA: Diagnosis not present

## 2021-08-10 DIAGNOSIS — F319 Bipolar disorder, unspecified: Secondary | ICD-10-CM

## 2021-08-10 NOTE — Progress Notes (Signed)
Per pharmacy Call to Provider to request refill for Restoril be sent [facility] to [facility]/ left msg on nurse line. Unable to reach a live person    Ned Clines [place] Pharmacist Assistant [phone]

## 2021-08-16 ENCOUNTER — Other Ambulatory Visit: Payer: Self-pay

## 2021-08-16 ENCOUNTER — Encounter (HOSPITAL_COMMUNITY): Payer: Self-pay

## 2021-08-16 ENCOUNTER — Encounter (HOSPITAL_COMMUNITY)
Admission: RE | Admit: 2021-08-16 | Discharge: 2021-08-16 | Disposition: A | Discharge status: Home / Self Care | Payer: Medicare HMO | Source: Ambulatory Visit | Attending: Orthopedic Surgery | Admitting: Orthopedic Surgery

## 2021-08-16 VITALS — BP 156/66 | HR 79 | Temp 97.7°F | Ht 69.5 in | Wt 267.0 lb

## 2021-08-16 DIAGNOSIS — K640 First degree hemorrhoids: Secondary | ICD-10-CM | POA: Diagnosis not present

## 2021-08-16 DIAGNOSIS — K91840 Postprocedural hemorrhage and hematoma of a digestive system organ or structure following a digestive system procedure: Secondary | ICD-10-CM

## 2021-08-16 DIAGNOSIS — Z01812 Encounter for preprocedural laboratory examination: Secondary | ICD-10-CM | POA: Insufficient documentation

## 2021-08-16 DIAGNOSIS — K5730 Diverticulosis of large intestine without perforation or abscess without bleeding: Secondary | ICD-10-CM | POA: Insufficient documentation

## 2021-08-16 DIAGNOSIS — T84012A Broken internal right knee prosthesis, initial encounter: Secondary | ICD-10-CM | POA: Diagnosis not present

## 2021-08-16 DIAGNOSIS — Z01818 Encounter for other preprocedural examination: Secondary | ICD-10-CM

## 2021-08-16 DIAGNOSIS — X58XXXA Exposure to other specified factors, initial encounter: Secondary | ICD-10-CM | POA: Insufficient documentation

## 2021-08-16 DIAGNOSIS — R7303 Prediabetes: Secondary | ICD-10-CM | POA: Insufficient documentation

## 2021-08-16 DIAGNOSIS — D126 Benign neoplasm of colon, unspecified: Secondary | ICD-10-CM | POA: Diagnosis not present

## 2021-08-16 DIAGNOSIS — F3131 Bipolar disorder, current episode depressed, mild: Secondary | ICD-10-CM | POA: Diagnosis not present

## 2021-08-16 DIAGNOSIS — R69 Illness, unspecified: Secondary | ICD-10-CM | POA: Diagnosis not present

## 2021-08-16 DIAGNOSIS — M7989 Other specified soft tissue disorders: Secondary | ICD-10-CM | POA: Insufficient documentation

## 2021-08-16 DIAGNOSIS — I10 Essential (primary) hypertension: Secondary | ICD-10-CM | POA: Insufficient documentation

## 2021-08-16 DIAGNOSIS — R194 Change in bowel habit: Secondary | ICD-10-CM | POA: Insufficient documentation

## 2021-08-16 DIAGNOSIS — Z86010 Personal history of colonic polyps: Secondary | ICD-10-CM | POA: Diagnosis not present

## 2021-08-16 DIAGNOSIS — H353232 Exudative age-related macular degeneration, bilateral, with inactive choroidal neovascularization: Secondary | ICD-10-CM | POA: Diagnosis not present

## 2021-08-16 DIAGNOSIS — I447 Left bundle-branch block, unspecified: Secondary | ICD-10-CM | POA: Insufficient documentation

## 2021-08-16 DIAGNOSIS — D122 Benign neoplasm of ascending colon: Secondary | ICD-10-CM | POA: Diagnosis not present

## 2021-08-16 HISTORY — DX: Prediabetes: R73.03

## 2021-08-16 LAB — CBC
HCT: 44.4 % (ref 39.0–52.0)
Hemoglobin: 14.9 g/dL (ref 13.0–17.0)
MCH: 29.4 pg (ref 26.0–34.0)
MCHC: 33.6 g/dL (ref 30.0–36.0)
MCV: 87.6 fL (ref 80.0–100.0)
Platelets: 233 10*3/uL (ref 150–400)
RBC: 5.07 MIL/uL (ref 4.22–5.81)
RDW: 14.8 % (ref 11.5–15.5)
WBC: 4.4 10*3/uL (ref 4.0–10.5)
nRBC: 0 % (ref 0.0–0.2)

## 2021-08-16 LAB — COMPREHENSIVE METABOLIC PANEL
ALT: 22 U/L (ref 0–44)
AST: 43 U/L — ABNORMAL HIGH (ref 15–41)
Albumin: 4.1 g/dL (ref 3.5–5.0)
Alkaline Phosphatase: 71 U/L (ref 38–126)
Anion gap: 10 (ref 5–15)
BUN: 18 mg/dL (ref 8–23)
CO2: 23 mmol/L (ref 22–32)
Calcium: 9.2 mg/dL (ref 8.9–10.3)
Chloride: 105 mmol/L (ref 98–111)
Creatinine, Ser: 0.91 mg/dL (ref 0.61–1.24)
GFR, Estimated: >60 mL/min (ref >60)
Glucose, Bld: 119 mg/dL — ABNORMAL HIGH (ref 70–99)
Potassium: 4.3 mmol/L (ref 3.5–5.1)
Sodium: 138 mmol/L (ref 135–145)
Total Bilirubin: 0.8 mg/dL (ref 0.3–1.2)
Total Protein: 6.8 g/dL (ref 6.5–8.1)

## 2021-08-16 LAB — GLUCOSE, CAPILLARY: Glucose-Capillary: 140 mg/dL — ABNORMAL HIGH (ref 70–99)

## 2021-08-16 LAB — SURGICAL PCR SCREEN
MRSA, PCR: NEGATIVE
Staphylococcus aureus: POSITIVE — AB

## 2021-08-16 LAB — PROTIME-INR
INR: 1.1 (ref 0.8–1.2)
Prothrombin Time: 14 seconds (ref 11.4–15.2)

## 2021-08-16 NOTE — Progress Notes (Signed)
   08/16/21 0913  OBSTRUCTIVE SLEEP APNEA  Have you ever been diagnosed with sleep apnea through a sleep study? No  Do you snore loudly (loud enough to be heard through closed doors)?  1  Do you often feel tired, fatigued, or sleepy during the daytime (such as falling asleep during driving or talking to someone)? 0  Has anyone observed you stop breathing during your sleep? 0  Do you have, or are you being treated for high blood pressure? 1  BMI more than 35 kg/m2? 1  Age > 49 (1-yes) 1  Male Gender (Yes=1) 1  Obstructive Sleep Apnea Score 5  Score 5 or greater  Results sent to PCP

## 2021-08-16 NOTE — Progress Notes (Signed)
COVID Vaccine Completed: Yes Date COVID Vaccine completed:06/09/21. X 5 COVID vaccine manufacturer: Pfizer     COVID Test: 08/23/21 PCP - [name]: NP : clearance 04/24/21: Chart. Cardiologist -   Chest x-ray - CT chest: 05/23/21 EKG - 05/23/21 Stress Test -  ECHO - 06/24/16 Cardiac Cath -  Pacemaker/ICD device last checked:  Sleep Study - NO CPAP - NO  Fasting Blood Sugar -  Checks Blood Sugar _____ times a day  Blood Thinner Instructions: Aspirin Instructions: Last Dose:  Anesthesia review: Hx: Lt. BBB,HTN  Patient denies shortness of breath, fever, cough and chest pain at PAT appointment   Patient verbalized understanding of instructions that were given to them at the PAT appointment. Patient was also instructed that they will need to review over the PAT instructions again at home before surgery.

## 2021-08-16 NOTE — Progress Notes (Signed)
Lab. Result: PCR: Positive [facility].

## 2021-08-17 DIAGNOSIS — S42212A Unspecified displaced fracture of surgical neck of left humerus, initial encounter for closed fracture: Secondary | ICD-10-CM | POA: Diagnosis not present

## 2021-08-17 LAB — HEMOGLOBIN A1C
Hgb A1c MFr Bld: 6 % — ABNORMAL HIGH (ref 4.8–5.6)
Mean Plasma Glucose: 126 mg/dL

## 2021-08-19 ENCOUNTER — Ambulatory Visit (HOSPITAL_BASED_OUTPATIENT_CLINIC_OR_DEPARTMENT_OTHER): Payer: Medicare HMO | Admitting: *Deleted

## 2021-08-19 ENCOUNTER — Other Ambulatory Visit: Payer: Self-pay

## 2021-08-19 ENCOUNTER — Encounter (HOSPITAL_COMMUNITY): Payer: Self-pay | Admitting: *Deleted

## 2021-08-19 VITALS — BP 167/73 | HR 81 | Resp 20 | Ht 68.0 in | Wt 268.4 lb

## 2021-08-19 DIAGNOSIS — F411 Generalized anxiety disorder: Secondary | ICD-10-CM | POA: Diagnosis not present

## 2021-08-19 DIAGNOSIS — F319 Bipolar disorder, unspecified: Secondary | ICD-10-CM

## 2021-08-19 DIAGNOSIS — R69 Illness, unspecified: Secondary | ICD-10-CM | POA: Diagnosis not present

## 2021-08-19 NOTE — Patient Instructions (Addendum)
[place] presents today for due Abilify Maintena 400 mg injection. Pt is pleasant, appropriate and cooperative on approach. Injection prepared as ordered and given in right upper outer quadrant without complaint. Pt to return in approximately 28 days for next due injection.

## 2021-08-20 DIAGNOSIS — H43812 Vitreous degeneration, left eye: Secondary | ICD-10-CM | POA: Diagnosis not present

## 2021-08-20 DIAGNOSIS — H353232 Exudative age-related macular degeneration, bilateral, with inactive choroidal neovascularization: Secondary | ICD-10-CM | POA: Diagnosis not present

## 2021-08-23 ENCOUNTER — Other Ambulatory Visit: Payer: Self-pay | Admitting: Orthopedic Surgery

## 2021-08-23 LAB — SARS CORONAVIRUS 2 (TAT 6-24 HRS): SARS Coronavirus 2: NEGATIVE

## 2021-08-24 NOTE — Anesthesia Preprocedure Evaluation (Addendum)
Anesthesia Evaluation  Patient identified by MRN, date of birth, ID band Patient awake    Reviewed: Allergy & Precautions, NPO status , Patient's Chart, lab work & pertinent test results  Airway Mallampati: III  TM Distance: >3 FB Neck ROM: Full    Dental  (+) Chipped, Dental Advisory Given,    Pulmonary asthma ,    Pulmonary exam normal breath sounds clear to auscultation       Cardiovascular hypertension, Pt. on medications Normal cardiovascular exam Rhythm:Regular Rate:Normal  EKG LBBB  TTE 2017 - Left ventricle: The cavity size was normal. Wall thickness was  increased in a pattern of mild LVH. Systolic function was normal.  The estimated ejection fraction was in the range of 60% to 65%.  Doppler parameters are consistent with abnormal left ventricular  relaxation (grade 1 diastolic dysfunction).  - Mitral valve: There was mild regurgitation.  - Pulmonary arteries: PA peak pressure: 33 mm Hg (S).   Neuro/Psych PSYCHIATRIC DISORDERS Anxiety Depression Bipolar Disorder negative neurological ROS     GI/Hepatic Neg liver ROS, GERD  Controlled and Medicated,  Endo/Other  Morbid obesity (BMI 41)  Renal/GU negative Renal ROS  negative genitourinary   Musculoskeletal negative musculoskeletal ROS (+)   Abdominal   Peds  Hematology negative hematology ROS (+)   Anesthesia Other Findings   Reproductive/Obstetrics                           Anesthesia Physical Anesthesia Plan  ASA: 3  Anesthesia Plan: Regional and General   Post-op Pain Management: Regional block and Ofirmev IV (intra-op)   Induction:   PONV Risk Score and Plan: 2 and Treatment may vary due to age or medical condition, Midazolam, Ondansetron and Dexamethasone  Airway Management Planned: LMA  Additional Equipment:   Intra-op Plan:   Post-operative Plan: Extubation in OR  Informed Consent: I have reviewed  the patients History and Physical, chart, labs and discussed the procedure including the risks, benefits and alternatives for the proposed anesthesia with the patient or authorized representative who has indicated his/her understanding and acceptance.     Dental advisory given  Plan Discussed with: CRNA  Anesthesia Plan Comments: (Pt had PDPH with previous spinal. Requested GA. )      Anesthesia Quick Evaluation

## 2021-08-24 NOTE — H&P (Signed)
RIGHT TOTAL KNEE REVISION ADMISSION H&P  Patient is being admitted for right total knee arthroplasty revision.  Subjective:  Chief Complaint: Right knee pain s/p right TKA.  HPI: Sean Hill comes to clinic for a recheck of his right total knee. He has been utilizing an economy hinge brace. He reports the economy hinge brace has not provided relief. He experiences catching occasionally as well as severe symptoms of instability. He is not taking pain medication.   Sean Hill is a pleasant 80 year old male who comes to the clinic for follow-up of his right total knee arthroplasty. He was seen by Laureen Ochs, PA-C, on 03/08/2021 and felt to have an unstable total knee arthroplasty. 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 He has had issues for a few years now, but it has gotten much worse recently. He denies any swelling. He denies any warmth, fever, or chills. He was treated with an economy hinged brace and unfortunately the brace did not eliminate his symptoms of the pain. He comes in today for re-evaluation.   Discussed the patient's presenting complaints, history, and treatment options. He has an unstable total knee and associated pain with that. The discomfort developed a few years ago and he has also had significant instability symptoms which have been worsening. 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 Fortunately, there is no osteolysis. I recommended revising this. I believe that we could correct this with a polyethylene revision, but we of course would inspect the components to make sure they are stable. As long as we can get this well balanced with the polyethylene revision, we will do that. If indeed we can not achieve balance or if the components are loose, then we would do a total knee revision. I discussed all of this in detail with Sean Hill and his wife today. We went over the procedure, rehabilitation course, risks, and  complications. He would like to go ahead and proceed with surgical treatment. I feel that more than likely this will just be a polyethylene revision, but we will prepare it for both polyethylene or total knee revision. Information is given to [facility] for scheduling.  Patient Active Problem List   Diagnosis Date Noted   Bipolar affective disorder, currently depressed, mild ([place]) 10/21/2019   Hemorrhage of colon following colonoscopy 10/08/2019   Adenomatous polyp of ascending colon    Tubulovillous adenoma of colon    History of colonic polyps    Diverticulosis of colon without hemorrhage    Grade I internal hemorrhoids    Essential hypertension 08/23/2016   Left bundle branch block (LBBB) on electrocardiogram 06/10/2016   Hyperlipidemia 06/07/2016   Left leg swelling 01/06/2015   Change in bowel habits 01/06/2015   Gastroesophageal reflux disease 11/28/2011   Bipolar disorder ([place]) 11/25/2011    Past Medical History:  Diagnosis Date   Anal fissure    Anxiety    Arthritis    right wrist; pt. states "everywhere"   Asthma    Bipolar disorder ([place])    Cataract    Depression    GERD (gastroesophageal reflux disease)    Hemorrhage of colon following colonoscopy 10/08/2019   History of kidney stones    History of MRSA infection    left knee after arthroplasty   Hyperlipidemia    Hypertension    states under control with med., has been on med. x 1 yr.   Impaired hearing    Left bundle branch block (LBBB)  on electrocardiogram 10/29/2015   Macular degeneration (senile) of retina    left   Pre-diabetes    Wears glasses     Past Surgical History:  Procedure Laterality Date   ANKLE FUSION Bilateral    ANKLE SURGERY Bilateral    ligament surgery   BIOPSY  04/29/2020   Procedure: BIOPSY;  Surgeon: Lavena Bullion, DO;  Location: [facility];  Service: Gastroenterology;;   CARDIAC CATHETERIZATION  x 2   1983; 11/14/2003   CARPOMETACARPEL SUSPENSION PLASTY Right 09/22/2016    Procedure: right thumb carpometacarpal  ARTHROPLASTY;  Surgeon: Leanora Cover, MD;  Location: [place];  Service: Orthopedics;  Laterality: Right;  right thumb carpometacarpal  ARTHROPLASTY   CARPOMETACARPEL SUSPENSION PLASTY Right 11/02/2017   Procedure: RIGHT THUMB SUSPENSIONPLASTY WITH TIGHTROPE, DISTAL POLE SCAPHOID  EXCISION;  Surgeon: Leanora Cover, MD;  Location: [place];  Service: Orthopedics;  Laterality: Right;   COLONOSCOPY WITH PROPOFOL N/A 10/02/2019   Procedure: COLONOSCOPY WITH PROPOFOL;  Surgeon: Lavena Bullion, DO;  Location: [facility];  Service: Gastroenterology;  Laterality: N/A;   COLONOSCOPY WITH PROPOFOL N/A 10/09/2019   Procedure: COLONOSCOPY WITH PROPOFOL;  Surgeon: Gatha Mayer, MD;  Location: [facility];  Service: Endoscopy;  Laterality: N/A;   COLONOSCOPY WITH PROPOFOL N/A 04/29/2020   Procedure: COLONOSCOPY WITH PROPOFOL;  Surgeon: Lavena Bullion, DO;  Location: [facility];  Service: Gastroenterology;  Laterality: N/A;   ELBOW SURGERY Left    ENDOSCOPIC MUCOSAL RESECTION N/A 10/02/2019   Procedure: ENDOSCOPIC MUCOSAL RESECTION;  Surgeon: Lavena Bullion, DO;  Location: [facility];  Service: Gastroenterology;  Laterality: N/A;   HEMOSTASIS CLIP PLACEMENT  10/09/2019   Procedure: HEMOSTASIS CLIP PLACEMENT;  Surgeon: Gatha Mayer, MD;  [facility]: [facility];  Service: Endoscopy;;   INGUINAL HERNIA REPAIR Right    OPEN REDUCTION INTERNAL FIXATION (ORIF) DISTAL RADIAL FRACTURE Right 10/29/2015   Procedure: OPEN REDUCTION INTERNAL FIXATION (ORIF) DISTAL RADIAL FRACTURE;  Surgeon: Leanora Cover, MD;  Location: [place];  Service: Orthopedics;  Laterality: Right;   ORIF ELBOW FRACTURE Right    POLYPECTOMY  04/29/2020   Procedure: POLYPECTOMY;  Surgeon: Lavena Bullion, DO;  [facility]: [facility];  Service: Gastroenterology;;   SUBMUCOSAL LIFTING INJECTION  10/02/2019   Procedure: SUBMUCOSAL LIFTING INJECTION;   Surgeon: Lavena Bullion, DO;  Location: [facility];  Service: Gastroenterology;;   TONSILLECTOMY     TOTAL KNEE ARTHROPLASTY Bilateral    ULNAR COLLATERAL LIGAMENT REPAIR Right 12/29/2015   Procedure: REPAIR  RIGHT LATERAL ULNAR COLLATERAL LIGAMENT TEAR  EXTENSOR ORIGIN ;  Surgeon: Leanora Cover, MD;  Location: [place];  Service: Orthopedics;  Laterality: Right;   WRIST ARTHROSCOPY WITH DEBRIDEMENT Right 07/14/2016   Procedure: RIGHT WRIST ARTHROSCOPY WITH DEBRIDEMENT  TRIANGULAR FIBROCARTILAGE COMPLEX;  Surgeon: Leanora Cover, MD;  Location: [place];  Service: Orthopedics;  Laterality: Right;    Prior to Admission medications   Medication Sig Start Date End Date Taking? Authorizing Provider  albuterol (VENTOLIN HFA) 108 (90 Base) MCG/ACT inhaler Inhale 2 puffs into the lungs every 6 (six) hours as needed for wheezing or shortness of breath. 12/29/20  Yes Nafziger, Tommi Rumps, NP  ARIPiprazole ER (ABILIFY MAINTENA) 400 MG PRSY prefilled syringe Inject 400 mg into the muscle every 28 (twenty-eight) days. 08/05/21 08/05/22 Yes Arfeen, Arlyce Harman, MD  benztropine (COGENTIN) 0.5 MG tablet Take 1 tablet (0.5 mg total) by mouth daily. 08/05/21  Yes Arfeen, Arlyce Harman, MD  cetirizine (  ZYRTEC) 10 MG tablet Take 10 mg by mouth daily.   Yes [provider]  citalopram (CELEXA) 10 MG tablet Take 20 mg by mouth daily. 08/06/21  Yes [provider]  ezetimibe (ZETIA) 10 MG tablet TAKE ONE TABLET BY MOUTH AT NOON 05/03/21  Yes Nafziger, Tommi Rumps, NP  fenofibrate (TRICOR) 145 MG tablet Take 1 tablet (145 mg total) by mouth daily. 04/30/21  Yes Nafziger, Tommi Rumps, NP  losartan (COZAAR) 50 MG tablet TAKE ONE TABLET BY MOUTH EVERY MORNING 05/03/21  Yes Nafziger, Tommi Rumps, NP  Multiple Vitamin (MULTIVITAMIN WITH MINERALS) TABS tablet Take 1 tablet by mouth daily.   Yes [provider]  Multiple Vitamins-Minerals (PRESERVISION AREDS 2 PO) Take 1 capsule by mouth 2 (two) times  daily.   Yes [provider]  Omega-3 Fatty Acids (FISH OIL) 1000 MG CAPS Take 1,000 mg by mouth in the morning and at bedtime.   Yes [provider]  pantoprazole (PROTONIX) 40 MG tablet Take 1 tablet (40 mg total) by mouth 2 (two) times daily. 05/18/21  Yes Cirigliano, Vito V, DO  temazepam (RESTORIL) 30 MG capsule Take 1 capsule (30 mg total) by mouth at bedtime. 08/05/21  Yes Arfeen, Arlyce Harman, MD  Vitamin D, Cholecalciferol, 1000 UNITS TABS Take 1,000 Units by mouth daily.    Yes [provider]  citalopram (CELEXA) 20 MG tablet Take 1 tablet (20 mg total) by mouth daily. Patient not taking: Reported on 08/09/2021 08/05/21 08/05/22  Arfeen, Arlyce Harman, MD  HYDROcodone bit-homatropine (HYCODAN) 5-1.5 MG/5ML syrup Take 5 mLs by mouth every 8 (eight) hours as needed. Patient not taking: Reported on 08/09/2021 07/09/21   [name], NP  meclizine (ANTIVERT) 25 MG tablet Take 1 tablet (25 mg total) by mouth 3 (three) times daily as needed for dizziness. Patient not taking: Reported on 08/09/2021 05/23/21   Dorie Rank, MD  predniSONE (DELTASONE) 20 MG tablet Take 1 tablet (20 mg total) by mouth daily with breakfast. Patient not taking: Reported on 08/09/2021 07/09/21   [name], NP    Allergies  Allergen Reactions   Penicillins Other (See Comments)    BLISTERS Did it involve swelling of the face/tongue/throat, SOB, or low BP? No Did it involve sudden or severe rash/hives, skin peeling, or any reaction on the inside of your mouth or nose? No Did you need to seek medical attention at a hospital or doctor's office? No When did it last happen? around 1980    If all above answers are "NO", may proceed with cephalosporin use.    Lisinopril Diarrhea and Cough    Dry cough, abdominal bloating and diarrhea   Codeine Rash    Social History   Socioeconomic History   Marital status: Married    Spouse name: Not on file   Number of children: Not on file   Years of  education: Not on file   Highest education level: Not on file  Occupational History   Not on file  Tobacco Use   Smoking status: Never   Smokeless tobacco: Never  Vaping Use   Vaping Use: Never used  Substance and Sexual Activity   Alcohol use: Yes    Comment: occasional.   Drug use: No   Sexual activity: Not Currently    Birth control/protection: None  Other Topics Concern   Not on file  Social History Narrative   Married, lives in [place].  Retired Dance movement psychotherapist.   Two daughters, both married,3 grandchildren ( 21, 24, 1 year)  No regular exercise.  Never smoker.  No ETOH.  No drugs.   Social Determinants of Health   Financial Resource Strain: Low Risk    Difficulty of Paying Living Expenses: Not hard at all  Food Insecurity: No Food Insecurity   Worried About Charity fundraiser in the Last Year: Never true   [place] in the Last Year: Never true  Transportation Needs: No Transportation Needs   Lack of Transportation (Medical): No   Lack of Transportation (Non-Medical): No  Physical Activity: Inactive   Days of Exercise per Week: 0 days   Minutes of Exercise per Session: 0 min  Stress: No Stress Concern Present   Feeling of Stress : Not at all  Social Connections: Moderately Isolated   Frequency of Communication with Friends and Family: Once a week   Frequency of Social Gatherings with Friends and Family: More than three times a week   Attends Religious Services: Never   Marine scientist or Organizations: No   Attends Music therapist: Never   Marital Status: Married  Human resources officer Violence: Not At Risk   Fear of Current or Ex-Partner: No   Emotionally Abused: No   Physically Abused: No   Sexually Abused: No    Tobacco Use: Low Risk    Smoking Tobacco Use: Never   Smokeless Tobacco Use: Never   Passive Exposure: Not on file   Social History   Substance and Sexual Activity  Alcohol Use Yes   Comment: occasional.     Family History  Problem Relation Age of Onset   Alcohol abuse Mother    Ovarian cancer Mother    Alcohol abuse Father    Pancreatic cancer Father    Hyperlipidemia Brother    Barrett's esophagus Brother    Colon cancer Maternal Grandmother    Depression Daughter    Paranoid behavior Daughter    Esophageal cancer Neg Hx    Stomach cancer Neg Hx    Rectal cancer Neg Hx    Colon polyps Neg Hx     ROS: Constitutional: no fever, no chills, no night sweats, no significant weight loss Cardiovascular: no chest pain, no palpitations Respiratory: no cough, no shortness of breath, No COPD Gastrointestinal: no vomiting, no nausea Musculoskeletal: no swelling in Joints, Joint Pain Neurologic: no numbness, no tingling, no difficulty with balance   Objective:  Physical Exam: Well nourished and well developed.  General: Alert and oriented x3, cooperative and pleasant, no acute distress.  Head: normocephalic, atraumatic, neck supple.  Eyes: EOMI.  Respiratory: breath sounds clear in all fields, no wheezing, rales, or rhonchi. Cardiovascular: Regular rate and rhythm, no murmurs, gallops or rubs.  Abdomen: non-tender to palpation and soft, normoactive bowel sounds. Musculoskeletal:  The patient has an antalgic gait pattern on the right.    Right Hip Exam:  The range of motion: normal without discomfort.    Right Knee Exam:  No warmth or effusion present. No swelling present.  The range of motion is: 0 to 115 or 120 degrees.  No crepitus on range of motion of the knee.  No medial joint line tenderness.  No lateral joint line tenderness.  Significant varus/valgus and AP laxity.     Calves soft and nontender. Motor function intact in LE. Strength 5/5 LE bilaterally. Neuro: Distal pulses 2+. Sensation to light touch intact in LE.    The patient's sensation and motor function are intact in their lower extremities. Their distal pulses are  2+. The bilateral calves are soft and  nontender.      Vital signs in last 24 hours:    Imaging Review Radiographs- AP and lateral of the right knee dated 03/10/2021 demonstrate a Sigma rotating platform total knee arthroplasty in excellent position with no periprosthetic abnormalities.   Assessment/Plan:  Failed right total knee arthroplasty   The patient history, physical examination, clinical judgment of the provider and imaging studies are consistent with failed total knee arthroplasty of the right knee and total knee arthroplasty revision is deemed medically necessary. The risks and benefits of total knee arthroplasty revision were presented and reviewed. The risks due to aseptic loosening, infection, stiffness, patella tracking problems, thromboembolic complications and other imponderables were discussed. The patient acknowledged the explanation, agreed to proceed with the plan and consent was signed. Patient is being admitted for inpatient treatment for surgery, pain control, PT, OT, prophylactic antibiotics, VTE prophylaxis, progressive ambulation and ADLs and discharge planning. The patient is planning to be discharged  home .   Patient's anticipated LOS is less than 2 midnights, meeting these requirements: - Lives within 1 hour of care - Has a competent adult at home to recover with post-op - NO history of  - Chronic pain requiring opioids  - Diabetes  - Coronary Artery Disease  - Heart failure  - Heart attack  - Stroke  - DVT/VTE  - Cardiac arrhythmia  - Respiratory Failure/COPD  - Renal failure  - Anemia  - Advanced Liver disease   - Patient was instructed on what medications to stop prior to surgery. - Follow-up visit in 2 weeks with Dr. Wynelle Link - Begin physical therapy following surgery - Pre-operative lab work as pre-surgical testing - Prescriptions will be provided in hospital at time of discharge  Fenton Foy, [facility], PA-C Orthopedic Surgery [facility] [place]

## 2021-08-25 ENCOUNTER — Encounter (HOSPITAL_COMMUNITY)
Admission: RE | Disposition: A | Discharge status: Home / Self Care | Payer: Self-pay | Source: Ambulatory Visit | Attending: Orthopedic Surgery

## 2021-08-25 ENCOUNTER — Other Ambulatory Visit: Payer: Self-pay

## 2021-08-25 ENCOUNTER — Inpatient Hospital Stay (HOSPITAL_COMMUNITY)
Admission: RE | Admit: 2021-08-25 | Discharge: 2021-08-26 | DRG: 488 | Disposition: A | Discharge status: Home / Self Care | Payer: Medicare HMO | Source: Ambulatory Visit | Attending: Orthopedic Surgery | Admitting: Orthopedic Surgery

## 2021-08-25 ENCOUNTER — Encounter (HOSPITAL_COMMUNITY): Payer: Self-pay | Admitting: Orthopedic Surgery

## 2021-08-25 ENCOUNTER — Inpatient Hospital Stay (HOSPITAL_COMMUNITY): Payer: Medicare HMO | Admitting: Anesthesiology

## 2021-08-25 ENCOUNTER — Inpatient Hospital Stay (HOSPITAL_COMMUNITY): Payer: Medicare HMO | Admitting: Physician Assistant

## 2021-08-25 DIAGNOSIS — Z86010 Personal history of colonic polyps: Secondary | ICD-10-CM

## 2021-08-25 DIAGNOSIS — I110 Hypertensive heart disease with heart failure: Secondary | ICD-10-CM | POA: Diagnosis present

## 2021-08-25 DIAGNOSIS — I2510 Atherosclerotic heart disease of native coronary artery without angina pectoris: Secondary | ICD-10-CM | POA: Diagnosis not present

## 2021-08-25 DIAGNOSIS — F419 Anxiety disorder, unspecified: Secondary | ICD-10-CM | POA: Diagnosis not present

## 2021-08-25 DIAGNOSIS — Z79891 Long term (current) use of opiate analgesic: Secondary | ICD-10-CM | POA: Diagnosis not present

## 2021-08-25 DIAGNOSIS — F3131 Bipolar disorder, current episode depressed, mild: Secondary | ICD-10-CM | POA: Diagnosis present

## 2021-08-25 DIAGNOSIS — Z818 Family history of other mental and behavioral disorders: Secondary | ICD-10-CM | POA: Diagnosis not present

## 2021-08-25 DIAGNOSIS — E785 Hyperlipidemia, unspecified: Secondary | ICD-10-CM | POA: Diagnosis present

## 2021-08-25 DIAGNOSIS — T84018A Broken internal joint prosthesis, other site, initial encounter: Secondary | ICD-10-CM

## 2021-08-25 DIAGNOSIS — T84022A Instability of internal right knee prosthesis, initial encounter: Secondary | ICD-10-CM | POA: Diagnosis not present

## 2021-08-25 DIAGNOSIS — Y792 Prosthetic and other implants, materials and accessory orthopedic devices associated with adverse incidents: Secondary | ICD-10-CM | POA: Diagnosis not present

## 2021-08-25 DIAGNOSIS — H9190 Unspecified hearing loss, unspecified ear: Secondary | ICD-10-CM | POA: Diagnosis present

## 2021-08-25 DIAGNOSIS — Z8614 Personal history of Methicillin resistant Staphylococcus aureus infection: Secondary | ICD-10-CM | POA: Diagnosis not present

## 2021-08-25 DIAGNOSIS — I447 Left bundle-branch block, unspecified: Secondary | ICD-10-CM | POA: Diagnosis present

## 2021-08-25 DIAGNOSIS — Z811 Family history of alcohol abuse and dependence: Secondary | ICD-10-CM | POA: Diagnosis not present

## 2021-08-25 DIAGNOSIS — Z981 Arthrodesis status: Secondary | ICD-10-CM

## 2021-08-25 DIAGNOSIS — G8929 Other chronic pain: Secondary | ICD-10-CM | POA: Diagnosis present

## 2021-08-25 DIAGNOSIS — Z800 Family history of malignant neoplasm of digestive organs: Secondary | ICD-10-CM

## 2021-08-25 DIAGNOSIS — Z96659 Presence of unspecified artificial knee joint: Secondary | ICD-10-CM

## 2021-08-25 DIAGNOSIS — Z8041 Family history of malignant neoplasm of ovary: Secondary | ICD-10-CM | POA: Diagnosis not present

## 2021-08-25 DIAGNOSIS — Z96651 Presence of right artificial knee joint: Secondary | ICD-10-CM | POA: Diagnosis not present

## 2021-08-25 DIAGNOSIS — E119 Type 2 diabetes mellitus without complications: Secondary | ICD-10-CM | POA: Diagnosis not present

## 2021-08-25 DIAGNOSIS — T84012A Broken internal right knee prosthesis, initial encounter: Secondary | ICD-10-CM

## 2021-08-25 DIAGNOSIS — G8918 Other acute postprocedural pain: Secondary | ICD-10-CM | POA: Diagnosis not present

## 2021-08-25 DIAGNOSIS — D649 Anemia, unspecified: Secondary | ICD-10-CM | POA: Diagnosis present

## 2021-08-25 DIAGNOSIS — Z87442 Personal history of urinary calculi: Secondary | ICD-10-CM

## 2021-08-25 DIAGNOSIS — R69 Illness, unspecified: Secondary | ICD-10-CM | POA: Diagnosis not present

## 2021-08-25 DIAGNOSIS — Z20822 Contact with and (suspected) exposure to covid-19: Secondary | ICD-10-CM | POA: Diagnosis not present

## 2021-08-25 DIAGNOSIS — Z83438 Family history of other disorder of lipoprotein metabolism and other lipidemia: Secondary | ICD-10-CM

## 2021-08-25 DIAGNOSIS — I10 Essential (primary) hypertension: Secondary | ICD-10-CM | POA: Diagnosis not present

## 2021-08-25 DIAGNOSIS — Z8719 Personal history of other diseases of the digestive system: Secondary | ICD-10-CM

## 2021-08-25 DIAGNOSIS — Z6841 Body Mass Index (BMI) 40.0 and over, adult: Secondary | ICD-10-CM

## 2021-08-25 DIAGNOSIS — K219 Gastro-esophageal reflux disease without esophagitis: Secondary | ICD-10-CM | POA: Diagnosis not present

## 2021-08-25 HISTORY — PX: TOTAL KNEE REVISION: SHX996

## 2021-08-25 LAB — GLUCOSE, CAPILLARY: Glucose-Capillary: 133 mg/dL — ABNORMAL HIGH (ref 70–99)

## 2021-08-25 SURGERY — TOTAL KNEE REVISION
Anesthesia: Regional | Site: Knee | Laterality: Right

## 2021-08-25 MED ORDER — FENTANYL CITRATE (PF) 250 MCG/5ML IJ SOLN
INTRAMUSCULAR | Status: DC | PRN
  Administered 2021-08-25: 100 ug via INTRAVENOUS

## 2021-08-25 MED ORDER — BISACODYL 10 MG RE SUPP: 10.0000 mg | Freq: Every day | RECTAL | Status: DC | PRN

## 2021-08-25 MED ORDER — BUPIVACAINE LIPOSOME 1.3 % IJ SUSP: 20.0000 mL | Freq: Once | INTRAMUSCULAR | Status: DC

## 2021-08-25 MED ORDER — FENOFIBRATE 160 MG PO TABS
160.0000 mg | ORAL_TABLET | Freq: Every day | ORAL | Status: DC
  Administered 2021-08-26: 160 mg via ORAL
  Filled 2021-08-25: qty 1

## 2021-08-25 MED ORDER — KETAMINE HCL 10 MG/ML IJ SOLN
INTRAMUSCULAR | Status: DC | PRN
  Administered 2021-08-25: 50 mg via INTRAVENOUS

## 2021-08-25 MED ORDER — MIDAZOLAM HCL 2 MG/2ML IJ SOLN
INTRAMUSCULAR | Status: AC
  Filled 2021-08-25: qty 2

## 2021-08-25 MED ORDER — ONDANSETRON HCL 4 MG PO TABS: 4.0000 mg | ORAL_TABLET | Freq: Four times a day (QID) | ORAL | Status: DC | PRN

## 2021-08-25 MED ORDER — DOCUSATE SODIUM 100 MG PO CAPS
100.0000 mg | ORAL_CAPSULE | Freq: Two times a day (BID) | ORAL | Status: DC
  Administered 2021-08-25 – 2021-08-26 (×2): 100 mg via ORAL
  Filled 2021-08-25 (×2): qty 1

## 2021-08-25 MED ORDER — LACTATED RINGERS IV SOLN
INTRAVENOUS | Status: DC
  Administered 2021-08-25: 1000 mL via INTRAVENOUS

## 2021-08-25 MED ORDER — SODIUM CHLORIDE (PF) 0.9 % IJ SOLN
INTRAMUSCULAR | Status: AC
  Filled 2021-08-25: qty 10

## 2021-08-25 MED ORDER — MIDAZOLAM HCL 5 MG/5ML IJ SOLN
INTRAMUSCULAR | Status: DC | PRN
Start: 2021-08-25 — End: 2021-08-25
  Administered 2021-08-25: 2 mg via INTRAVENOUS

## 2021-08-25 MED ORDER — BUPIVACAINE LIPOSOME 1.3 % IJ SUSP
INTRAMUSCULAR | Status: DC | PRN
  Administered 2021-08-25: 20 mL

## 2021-08-25 MED ORDER — ONDANSETRON HCL 4 MG/2ML IJ SOLN
INTRAMUSCULAR | Status: DC | PRN
  Administered 2021-08-25: 4 mg via INTRAVENOUS

## 2021-08-25 MED ORDER — FENTANYL CITRATE (PF) 100 MCG/2ML IJ SOLN
INTRAMUSCULAR | Status: DC | PRN
  Administered 2021-08-25 (×2): 50 ug via INTRAVENOUS

## 2021-08-25 MED ORDER — POLYETHYLENE GLYCOL 3350 17 G PO PACK: 17.0000 g | PACK | Freq: Every day | ORAL | Status: DC | PRN

## 2021-08-25 MED ORDER — CEFAZOLIN SODIUM-DEXTROSE 2-4 GM/100ML-% IV SOLN
2.0000 g | Freq: Four times a day (QID) | INTRAVENOUS | Status: AC
  Administered 2021-08-25 (×2): 2 g via INTRAVENOUS
  Filled 2021-08-25 (×2): qty 100

## 2021-08-25 MED ORDER — PHENYLEPHRINE 40 MCG/ML (10ML) SYRINGE FOR IV PUSH (FOR BLOOD PRESSURE SUPPORT)
PREFILLED_SYRINGE | INTRAVENOUS | Status: AC
  Filled 2021-08-25: qty 10

## 2021-08-25 MED ORDER — CEFAZOLIN IN SODIUM CHLORIDE 3-0.9 GM/100ML-% IV SOLN
INTRAVENOUS | Status: AC
  Filled 2021-08-25: qty 100

## 2021-08-25 MED ORDER — SODIUM CHLORIDE 0.9 % IV SOLN: INTRAVENOUS | Status: DC

## 2021-08-25 MED ORDER — CHLORHEXIDINE GLUCONATE 0.12 % MT SOLN: 15.0000 mL | Freq: Once | OROMUCOSAL | Status: AC

## 2021-08-25 MED ORDER — ALBUTEROL SULFATE (2.5 MG/3ML) 0.083% IN NEBU: 2.5000 mg | INHALATION_SOLUTION | Freq: Four times a day (QID) | RESPIRATORY_TRACT | Status: DC | PRN

## 2021-08-25 MED ORDER — OXYCODONE HCL 5 MG PO TABS: 10.0000 mg | ORAL_TABLET | ORAL | Status: DC | PRN

## 2021-08-25 MED ORDER — ROCURONIUM BROMIDE 10 MG/ML (PF) SYRINGE
PREFILLED_SYRINGE | INTRAVENOUS | Status: DC | PRN
Start: 2021-08-25 — End: 2021-08-25
  Administered 2021-08-25: 100 mg via INTRAVENOUS

## 2021-08-25 MED ORDER — SODIUM CHLORIDE (PF) 0.9 % IJ SOLN
INTRAMUSCULAR | Status: DC | PRN
  Administered 2021-08-25: 60 mL

## 2021-08-25 MED ORDER — METHOCARBAMOL 500 MG IVPB - SIMPLE MED
INTRAVENOUS | Status: AC
  Administered 2021-08-25: 500 mg via INTRAVENOUS
  Filled 2021-08-25: qty 50

## 2021-08-25 MED ORDER — METHOCARBAMOL 500 MG IVPB - SIMPLE MED
500.0000 mg | Freq: Four times a day (QID) | INTRAVENOUS | Status: DC | PRN
  Filled 2021-08-25: qty 50

## 2021-08-25 MED ORDER — CEFAZOLIN SODIUM-DEXTROSE 2-4 GM/100ML-% IV SOLN
2.0000 g | INTRAVENOUS | Status: AC
  Administered 2021-08-25: 3 g via INTRAVENOUS
  Filled 2021-08-25: qty 100

## 2021-08-25 MED ORDER — KETAMINE HCL 10 MG/ML IJ SOLN
INTRAMUSCULAR | Status: AC
  Filled 2021-08-25: qty 1

## 2021-08-25 MED ORDER — PROPOFOL 10 MG/ML IV BOLUS
INTRAVENOUS | Status: AC
  Filled 2021-08-25: qty 20

## 2021-08-25 MED ORDER — PROPOFOL 10 MG/ML IV BOLUS
INTRAVENOUS | Status: DC | PRN
  Administered 2021-08-25: 150 mg via INTRAVENOUS

## 2021-08-25 MED ORDER — METOCLOPRAMIDE HCL 5 MG/ML IJ SOLN: 5.0000 mg | Freq: Three times a day (TID) | INTRAMUSCULAR | Status: DC | PRN

## 2021-08-25 MED ORDER — ORAL CARE MOUTH RINSE
15.0000 mL | Freq: Once | OROMUCOSAL | Status: AC
  Administered 2021-08-25: 15 mL via OROMUCOSAL

## 2021-08-25 MED ORDER — SUGAMMADEX SODIUM 200 MG/2ML IV SOLN
INTRAVENOUS | Status: DC | PRN
  Administered 2021-08-25: 500 mg via INTRAVENOUS

## 2021-08-25 MED ORDER — FENTANYL CITRATE (PF) 250 MCG/5ML IJ SOLN
INTRAMUSCULAR | Status: AC
  Filled 2021-08-25: qty 5

## 2021-08-25 MED ORDER — PHENOL 1.4 % MT LIQD: 1.0000 | OROMUCOSAL | Status: DC | PRN

## 2021-08-25 MED ORDER — FENTANYL CITRATE (PF) 100 MCG/2ML IJ SOLN
INTRAMUSCULAR | Status: AC
  Filled 2021-08-25: qty 2

## 2021-08-25 MED ORDER — DIPHENHYDRAMINE HCL 12.5 MG/5ML PO ELIX
12.5000 mg | ORAL_SOLUTION | ORAL | Status: DC | PRN
  Administered 2021-08-26: 25 mg via ORAL
  Filled 2021-08-25: qty 10

## 2021-08-25 MED ORDER — TEMAZEPAM 15 MG PO CAPS
30.0000 mg | ORAL_CAPSULE | Freq: Every evening | ORAL | Status: DC | PRN
  Administered 2021-08-25: 30 mg via ORAL
  Filled 2021-08-25: qty 2

## 2021-08-25 MED ORDER — CITALOPRAM HYDROBROMIDE 20 MG PO TABS: 20.0000 mg | ORAL_TABLET | Freq: Every day | ORAL | Status: DC

## 2021-08-25 MED ORDER — TRANEXAMIC ACID-NACL 1000-0.7 MG/100ML-% IV SOLN
1000.0000 mg | INTRAVENOUS | Status: AC
  Administered 2021-08-25: 1000 mg via INTRAVENOUS
  Filled 2021-08-25: qty 100

## 2021-08-25 MED ORDER — EZETIMIBE 10 MG PO TABS
10.0000 mg | ORAL_TABLET | Freq: Every day | ORAL | Status: DC
  Administered 2021-08-26: 10 mg via ORAL
  Filled 2021-08-25: qty 1

## 2021-08-25 MED ORDER — DEXAMETHASONE SODIUM PHOSPHATE 10 MG/ML IJ SOLN
INTRAMUSCULAR | Status: DC | PRN
  Administered 2021-08-25: 5 mg

## 2021-08-25 MED ORDER — MENTHOL 3 MG MT LOZG: 1.0000 | LOZENGE | OROMUCOSAL | Status: DC | PRN

## 2021-08-25 MED ORDER — DEXAMETHASONE SODIUM PHOSPHATE 10 MG/ML IJ SOLN
10.0000 mg | Freq: Once | INTRAMUSCULAR | Status: AC
  Administered 2021-08-26: 10 mg via INTRAVENOUS
  Filled 2021-08-25: qty 1

## 2021-08-25 MED ORDER — DEXAMETHASONE SODIUM PHOSPHATE 10 MG/ML IJ SOLN
8.0000 mg | Freq: Once | INTRAMUSCULAR | Status: AC
  Administered 2021-08-25: 10 mg via INTRAVENOUS

## 2021-08-25 MED ORDER — ACETAMINOPHEN 10 MG/ML IV SOLN: 1000.0000 mg | Freq: Four times a day (QID) | INTRAVENOUS | Status: DC

## 2021-08-25 MED ORDER — PHENYLEPHRINE HCL-NACL 20-0.9 MG/250ML-% IV SOLN
INTRAVENOUS | Status: AC
  Filled 2021-08-25: qty 250

## 2021-08-25 MED ORDER — MORPHINE SULFATE (PF) 2 MG/ML IV SOLN: 0.5000 mg | INTRAVENOUS | Status: DC | PRN

## 2021-08-25 MED ORDER — FLEET ENEMA 7-19 GM/118ML RE ENEM: 1.0000 | ENEMA | Freq: Once | RECTAL | Status: DC | PRN

## 2021-08-25 MED ORDER — LOSARTAN POTASSIUM 50 MG PO TABS
50.0000 mg | ORAL_TABLET | Freq: Every morning | ORAL | Status: DC
  Administered 2021-08-26: 50 mg via ORAL
  Filled 2021-08-25: qty 1

## 2021-08-25 MED ORDER — SODIUM CHLORIDE 0.9 % IR SOLN
Status: DC | PRN
  Administered 2021-08-25: 1000 mL

## 2021-08-25 MED ORDER — LACTATED RINGERS IV SOLN: INTRAVENOUS | Status: DC

## 2021-08-25 MED ORDER — PANTOPRAZOLE SODIUM 40 MG PO TBEC
40.0000 mg | DELAYED_RELEASE_TABLET | Freq: Two times a day (BID) | ORAL | Status: DC
  Administered 2021-08-25 – 2021-08-26 (×2): 40 mg via ORAL
  Filled 2021-08-25 (×2): qty 1

## 2021-08-25 MED ORDER — BENZTROPINE MESYLATE 0.5 MG PO TABS
0.5000 mg | ORAL_TABLET | Freq: Every day | ORAL | Status: DC
  Administered 2021-08-25 – 2021-08-26 (×2): 0.5 mg via ORAL
  Filled 2021-08-25 (×2): qty 1

## 2021-08-25 MED ORDER — PHENYLEPHRINE 40 MCG/ML (10ML) SYRINGE FOR IV PUSH (FOR BLOOD PRESSURE SUPPORT)
PREFILLED_SYRINGE | INTRAVENOUS | Status: DC | PRN
  Administered 2021-08-25: 160 ug via INTRAVENOUS

## 2021-08-25 MED ORDER — LIDOCAINE 2% (20 MG/ML) 5 ML SYRINGE
INTRAMUSCULAR | Status: DC | PRN
  Administered 2021-08-25: 40 mg via INTRAVENOUS

## 2021-08-25 MED ORDER — ACETAMINOPHEN 10 MG/ML IV SOLN
1000.0000 mg | Freq: Once | INTRAVENOUS | Status: AC
  Administered 2021-08-25: 1000 mg via INTRAVENOUS
  Filled 2021-08-25: qty 100

## 2021-08-25 MED ORDER — POVIDONE-IODINE 10 % EX SWAB
2.0000 "application " | Freq: Once | CUTANEOUS | Status: AC
  Administered 2021-08-25: 2 via TOPICAL

## 2021-08-25 MED ORDER — ROCURONIUM BROMIDE 10 MG/ML (PF) SYRINGE
PREFILLED_SYRINGE | INTRAVENOUS | Status: AC
  Filled 2021-08-25: qty 10

## 2021-08-25 MED ORDER — PROPOFOL 1000 MG/100ML IV EMUL
INTRAVENOUS | Status: AC
  Filled 2021-08-25: qty 100

## 2021-08-25 MED ORDER — FENTANYL CITRATE PF 50 MCG/ML IJ SOSY: 25.0000 ug | PREFILLED_SYRINGE | INTRAMUSCULAR | Status: DC | PRN

## 2021-08-25 MED ORDER — ROPIVACAINE HCL 5 MG/ML IJ SOLN
INTRAMUSCULAR | Status: DC | PRN
  Administered 2021-08-25: 30 mL via PERINEURAL

## 2021-08-25 MED ORDER — METOCLOPRAMIDE HCL 5 MG PO TABS: 5.0000 mg | ORAL_TABLET | Freq: Three times a day (TID) | ORAL | Status: DC | PRN

## 2021-08-25 MED ORDER — STERILE WATER FOR IRRIGATION IR SOLN
Status: DC | PRN
  Administered 2021-08-25: 2000 mL

## 2021-08-25 MED ORDER — METHOCARBAMOL 500 MG PO TABS
500.0000 mg | ORAL_TABLET | Freq: Four times a day (QID) | ORAL | Status: DC | PRN
  Administered 2021-08-25 – 2021-08-26 (×2): 500 mg via ORAL
  Filled 2021-08-25 (×2): qty 1

## 2021-08-25 MED ORDER — BUPIVACAINE LIPOSOME 1.3 % IJ SUSP
INTRAMUSCULAR | Status: AC
  Filled 2021-08-25: qty 20

## 2021-08-25 MED ORDER — SUGAMMADEX SODIUM 500 MG/5ML IV SOLN
INTRAVENOUS | Status: AC
  Filled 2021-08-25: qty 5

## 2021-08-25 MED ORDER — CITALOPRAM HYDROBROMIDE 20 MG PO TABS
20.0000 mg | ORAL_TABLET | Freq: Every day | ORAL | Status: DC
  Administered 2021-08-26: 20 mg via ORAL
  Filled 2021-08-25: qty 1

## 2021-08-25 MED ORDER — ONDANSETRON HCL 4 MG/2ML IJ SOLN: 4.0000 mg | Freq: Four times a day (QID) | INTRAMUSCULAR | Status: DC | PRN

## 2021-08-25 MED ORDER — ASPIRIN EC 325 MG PO TBEC
325.0000 mg | DELAYED_RELEASE_TABLET | Freq: Two times a day (BID) | ORAL | Status: DC
  Administered 2021-08-26: 325 mg via ORAL
  Filled 2021-08-25: qty 1

## 2021-08-25 MED ORDER — ACETAMINOPHEN 500 MG PO TABS
1000.0000 mg | ORAL_TABLET | Freq: Four times a day (QID) | ORAL | Status: AC
  Administered 2021-08-25 – 2021-08-26 (×4): 1000 mg via ORAL
  Filled 2021-08-25 (×4): qty 2

## 2021-08-25 MED ORDER — 0.9 % SODIUM CHLORIDE (POUR BTL) OPTIME
TOPICAL | Status: DC | PRN
  Administered 2021-08-25: 1000 mL

## 2021-08-25 SURGICAL SUPPLY — 60 items
BAG COUNTER SPONGE SURGICOUNT (BAG) IMPLANT
BAG DECANTER FOR FLEXI CONT (MISCELLANEOUS) ×2 IMPLANT
BAG SPEC THK2 15X12 ZIP CLS (MISCELLANEOUS)
BAG SPNG CNTER NS LX DISP (BAG)
BAG ZIPLOCK 12X15 (MISCELLANEOUS) IMPLANT
BLADE SAG 18X100X1.27 (BLADE) ×2 IMPLANT
BLADE SAW SGTL 11.0X1.19X90.0M (BLADE) ×2 IMPLANT
BLADE SURG SZ10 CARB STEEL (BLADE) ×4 IMPLANT
BNDG ELASTIC 6X5.8 VLCR STR LF (GAUZE/BANDAGES/DRESSINGS) ×2 IMPLANT
CLOTH BEACON ORANGE TIMEOUT ST (SAFETY) ×2 IMPLANT
COVER SURGICAL LIGHT HANDLE (MISCELLANEOUS) ×2 IMPLANT
CUFF TOURN SGL QUICK 34 (TOURNIQUET CUFF) ×2
CUFF TRNQT CYL 34X4.125X (TOURNIQUET CUFF) ×1 IMPLANT
DECANTER SPIKE VIAL GLASS SM (MISCELLANEOUS) IMPLANT
DRAPE INCISE IOBAN 66X45 STRL (DRAPES) ×2 IMPLANT
DRAPE U-SHAPE 47X51 STRL (DRAPES) ×2 IMPLANT
DRESSING AQUACEL AG SP 3.5X10 (GAUZE/BANDAGES/DRESSINGS) ×1 IMPLANT
DRSG ADAPTIC 3X8 NADH LF (GAUZE/BANDAGES/DRESSINGS) ×2 IMPLANT
DRSG AQUACEL AG SP 3.5X10 (GAUZE/BANDAGES/DRESSINGS) ×2
DRSG PAD ABDOMINAL 8X10 ST (GAUZE/BANDAGES/DRESSINGS) ×2 IMPLANT
DURAPREP 26ML APPLICATOR (WOUND CARE) ×2 IMPLANT
ELECT REM PT RETURN 15FT ADLT (MISCELLANEOUS) ×2 IMPLANT
EVACUATOR 1/8 PVC DRAIN (DRAIN) ×2 IMPLANT
GAUZE SPONGE 4X4 12PLY STRL (GAUZE/BANDAGES/DRESSINGS) ×2 IMPLANT
GLOVE SRG 8 PF TXTR STRL LF DI (GLOVE) ×1 IMPLANT
GLOVE SURG ENC MOIS LTX SZ6.5 (GLOVE) ×4 IMPLANT
GLOVE SURG ENC MOIS LTX SZ8 (GLOVE) ×4 IMPLANT
GLOVE SURG UNDER POLY LF SZ7 (GLOVE) ×2 IMPLANT
GLOVE SURG UNDER POLY LF SZ8 (GLOVE) ×2
GLOVE SURG UNDER POLY LF SZ8.5 (GLOVE) IMPLANT
GOWN STRL REUS W/TWL LRG LVL3 (GOWN DISPOSABLE) ×4 IMPLANT
HANDPIECE INTERPULSE COAX TIP (DISPOSABLE) ×2
HOLDER FOLEY CATH W/STRAP (MISCELLANEOUS) IMPLANT
IMMOBILIZER KNEE 20 (SOFTGOODS) ×2
IMMOBILIZER KNEE 20 THIGH 36 (SOFTGOODS) ×1 IMPLANT
INSERT PFC SIG STB SZ4 17.5MM (Knees) ×1 IMPLANT
KIT TURNOVER KIT A (KITS) IMPLANT
MANIFOLD NEPTUNE II (INSTRUMENTS) ×2 IMPLANT
NS IRRIG 1000ML POUR BTL (IV SOLUTION) ×2 IMPLANT
PACK TOTAL KNEE CUSTOM (KITS) ×2 IMPLANT
PADDING CAST ABS 6INX4YD NS (CAST SUPPLIES) ×1
PADDING CAST ABS COTTON 6X4 NS (CAST SUPPLIES) IMPLANT
PADDING CAST COTTON 6X4 STRL (CAST SUPPLIES) ×4 IMPLANT
PROTECTOR NERVE ULNAR (MISCELLANEOUS) ×2 IMPLANT
SET HNDPC FAN SPRY TIP SCT (DISPOSABLE) ×1 IMPLANT
STRIP CLOSURE SKIN 1/2X4 (GAUZE/BANDAGES/DRESSINGS) ×2 IMPLANT
SUT MNCRL AB 4-0 PS2 18 (SUTURE) ×2 IMPLANT
SUT STRATAFIX 0 PDS 27 VIOLET (SUTURE) ×2
SUT VIC AB 2-0 CT1 27 (SUTURE) ×6
SUT VIC AB 2-0 CT1 TAPERPNT 27 (SUTURE) ×3 IMPLANT
SUTURE STRATFX 0 PDS 27 VIOLET (SUTURE) ×1 IMPLANT
SWAB COLLECTION DEVICE MRSA (MISCELLANEOUS) IMPLANT
SWAB CULTURE ESWAB REG 1ML (MISCELLANEOUS) IMPLANT
SYR 50ML LL SCALE MARK (SYRINGE) ×4 IMPLANT
TOWER CARTRIDGE SMART MIX (DISPOSABLE) ×2 IMPLANT
TRAY FOLEY MTR SLVR 16FR STAT (SET/KITS/TRAYS/PACK) ×2 IMPLANT
TUBE KAMVAC SUCTION (TUBING) IMPLANT
TUBE SUCTION HIGH CAP CLEAR NV (SUCTIONS) ×2 IMPLANT
WATER STERILE IRR 1000ML POUR (IV SOLUTION) ×2 IMPLANT
WRAP KNEE MAXI GEL POST OP (GAUZE/BANDAGES/DRESSINGS) ×2 IMPLANT

## 2021-08-25 NOTE — Interval H&P Note (Signed)
History and Physical Interval Note:  08/25/2021 6:31 AM  Sean Hill  has presented today for surgery, with the diagnosis of unstable right total knee athroplasty.  The various methods of treatment have been discussed with the patient and family. After consideration of risks, benefits and other options for treatment, the patient has consented to  Procedure(s): Right knee polyethylene vs total knee arthroplasty revision (Right) as a surgical intervention.  The patient's history has been reviewed, patient examined, no change in status, stable for surgery.  I have reviewed the patient's chart and labs.  Questions were answered to the patient's satisfaction.     Pilar Plate Anarie Kalish

## 2021-08-25 NOTE — Progress Notes (Signed)
Orthopedic Tech Progress Note Patient Details:  Sean Hill 1941/03/14 [id number]  Patient ID: Sean Hill, male   DOB: [birth year], 80 y.o.   MRN: [id number]  [name] 08/25/2021, 10:14 AM Cpm applied in pacu

## 2021-08-25 NOTE — Discharge Instructions (Signed)
 Frank Aluisio, MD Total Joint Specialist [facility] [address] [place], [place] [address] [phone] POSTOPERATIVE DIRECTIONS  Knee Rehabilitation, Guidelines Following Surgery  Results after knee surgery are often greatly improved when you follow the exercise, range of motion and muscle strengthening exercises prescribed by your doctor. Safety measures are also important to protect the knee from further injury. If any of these exercises cause you to have increased pain or swelling in your knee joint, decrease the amount until you are comfortable again and slowly increase them. If you have problems or questions, call your caregiver or physical therapist for advice.   BLOOD CLOT PREVENTION Take a 325 mg Aspirin two times a day for three weeks following surgery. Then take an 81 mg Aspirin once a day for three weeks. Then discontinue Aspirin. You may resume your vitamins/supplements upon discharge from the hospital. Do not take any NSAIDs (Advil, Aleve, Ibuprofen, Meloxicam, etc.) until you have discontinued the 325 mg Aspirin.  HOME CARE INSTRUCTIONS  Remove items at home which could result in a fall. This includes throw rugs or furniture in walking pathways.  ICE to the affected knee as much as tolerated. Icing helps control swelling. If the swelling is well controlled you will be more comfortable and rehab easier. Continue to use ice on the knee for pain and swelling from surgery. You may notice swelling that will progress down to the foot and ankle. This is normal after surgery. Elevate the leg when you are not up walking on it.    Continue to use the breathing machine which will help keep your temperature down. It is common for your temperature to cycle up and down following surgery, especially at night when you are not up moving around and exerting yourself. The breathing machine keeps your lungs expanded and your temperature down. Do not place pillow  under the operative knee, focus on keeping the knee straight while resting  DIET You may resume your previous home diet once you are discharged from the hospital.  DRESSING / WOUND CARE / SHOWERING Keep your bulky bandage on for 2 days. On the third post-operative day you may remove the Ace bandage and gauze. There is a waterproof adhesive bandage on your skin which will stay in place until your first follow-up appointment. Once you remove this you will not need to place another bandage You may begin showering 3 days following surgery, but do not submerge the incision under water.  ACTIVITY For the first 5 days, the key is rest and control of pain and swelling Do your home exercises twice a day starting on post-operative day 3. On the days you go to physical therapy, just do the home exercises once that day. You should rest, ice and elevate the leg for 50 minutes out of every hour. Get up and walk/stretch for 10 minutes per hour. After 5 days you can increase your activity slowly as tolerated. Walk with your walker as instructed. Use the walker until you are comfortable transitioning to a cane. Walk with the cane in the opposite hand of the operative leg. You may discontinue the cane once you are comfortable and walking steadily. Avoid periods of inactivity such as sitting longer than an hour when not asleep. This helps prevent blood clots.  You may discontinue the knee immobilizer once you are able to perform a straight leg raise while lying down. You may resume a sexual relationship in one month or when given the OK by   your doctor.  You may return to work once you are cleared by your doctor.  Do not drive a car for 6 weeks or until released by your surgeon.  Do not drive while taking narcotics.  TED HOSE STOCKINGS Wear the elastic stockings on both legs for three weeks following surgery during the day. You may remove them at night for sleeping.  WEIGHT BEARING Weight bearing as tolerated  with assist device (walker, cane, etc) as directed, use it as long as suggested by your surgeon or therapist, typically at least 4-6 weeks.  POSTOPERATIVE CONSTIPATION PROTOCOL Constipation - defined medically as fewer than three stools per week and severe constipation as less than one stool per week.  One of the most common issues patients have following surgery is constipation.  Even if you have a regular bowel pattern at home, your normal regimen is likely to be disrupted due to multiple reasons following surgery.  Combination of anesthesia, postoperative narcotics, change in appetite and fluid intake all can affect your bowels.  In order to avoid complications following surgery, here are some recommendations in order to help you during your recovery period.  Colace (docusate) - Pick up an over-the-counter form of Colace or another stool softener and take twice a day as long as you are requiring postoperative pain medications.  Take with a full glass of water daily.  If you experience loose stools or diarrhea, hold the colace until you stool forms back up. If your symptoms do not get better within 1 week or if they get worse, check with your doctor. Dulcolax (bisacodyl) - Pick up over-the-counter and take as directed by the product packaging as needed to assist with the movement of your bowels.  Take with a full glass of water.  Use this product as needed if not relieved by Colace only.  MiraLax (polyethylene glycol) - Pick up over-the-counter to have on hand. MiraLax is a solution that will increase the amount of water in your bowels to assist with bowel movements.  Take as directed and can mix with a glass of water, juice, soda, coffee, or tea. Take if you go more than two days without a movement. Do not use MiraLax more than once per day. Call your doctor if you are still constipated or irregular after using this medication for 7 days in a row.  If you continue to have problems with postoperative  constipation, please contact the office for further assistance and recommendations.  If you experience "the worst abdominal pain ever" or develop nausea or vomiting, please contact the office immediatly for further recommendations for treatment.  ITCHING If you experience itching with your medications, try taking only a single pain pill, or even half a pain pill at a time.  You can also use Benadryl over the counter for itching or also to help with sleep.   MEDICATIONS See your medication summary on the "After Visit Summary" that the nursing staff will review with you prior to discharge.  You may have some home medications which will be placed on hold until you complete the course of blood thinner medication.  It is important for you to complete the blood thinner medication as prescribed by your surgeon.  Continue your approved medications as instructed at time of discharge.  PRECAUTIONS If you experience chest pain or shortness of breath - call 911 immediately for transfer to the hospital emergency department.  If you develop a fever greater that 101 F, purulent drainage from wound, increased redness   or drainage from wound, foul odor from the wound/dressing, or calf pain - CONTACT YOUR SURGEON.                                                   FOLLOW-UP APPOINTMENTS Make sure you keep all of your appointments after your operation with your surgeon and caregivers. You should call the office at the above phone number and make an appointment for approximately two weeks after the date of your surgery or on the date instructed by your surgeon outlined in the "After Visit Summary".  RANGE OF MOTION AND STRENGTHENING EXERCISES  Rehabilitation of the knee is important following a knee injury or an operation. After just a few days of immobilization, the muscles of the thigh which control the knee become weakened and shrink (atrophy). Knee exercises are designed to build up the tone and strength of the thigh  muscles and to improve knee motion. Often times heat used for twenty to thirty minutes before working out will loosen up your tissues and help with improving the range of motion but do not use heat for the first two weeks following surgery. These exercises can be done on a training (exercise) mat, on the floor, on a table or on a bed. Use what ever works the best and is most comfortable for you Knee exercises include:  Leg Lifts - While your knee is still immobilized in a splint or cast, you can do straight leg raises. Lift the leg to 60 degrees, hold for 3 sec, and slowly lower the leg. Repeat 10-20 times 2-3 times daily. Perform this exercise against resistance later as your knee gets better.  Quad and Hamstring Sets - Tighten up the muscle on the front of the thigh (Quad) and hold for 5-10 sec. Repeat this 10-20 times hourly. Hamstring sets are done by pushing the foot backward against an object and holding for 5-10 sec. Repeat as with quad sets.  Leg Slides: Lying on your back, slowly slide your foot toward your buttocks, bending your knee up off the floor (only go as far as is comfortable). Then slowly slide your foot back down until your leg is flat on the floor again. Angel Wings: Lying on your back spread your legs to the side as far apart as you can without causing discomfort.  A rehabilitation program following serious knee injuries can speed recovery and prevent re-injury in the future due to weakened muscles. Contact your doctor or a physical therapist for more information on knee rehabilitation.   POST-OPERATIVE OPIOID TAPER INSTRUCTIONS: It is important to wean off of your opioid medication as soon as possible. If you do not need pain medication after your surgery it is ok to stop day one. Opioids include: Codeine, Hydrocodone(Norco, Vicodin), Oxycodone(Percocet, oxycontin) and hydromorphone amongst others.  Long term and even short term use of opiods can cause: Increased pain  response Dependence Constipation Depression Respiratory depression And more.  Withdrawal symptoms can include Flu like symptoms Nausea, vomiting And more Techniques to manage these symptoms Hydrate well Eat regular healthy meals Stay active Use relaxation techniques(deep breathing, meditating, yoga) Do Not substitute Alcohol to help with tapering If you have been on opioids for less than two weeks and do not have pain than it is ok to stop all together.  Plan to wean off of opioids This   plan should start within one week post op of your joint replacement. Maintain the same interval or time between taking each dose and first decrease the dose.  Cut the total daily intake of opioids by one tablet each day Next start to increase the time between doses. The last dose that should be eliminated is the evening dose.   IF YOU ARE TRANSFERRED TO A SKILLED REHAB FACILITY If the patient is transferred to a skilled rehab facility following release from the hospital, a list of the current medications will be sent to the facility for the patient to continue.  When discharged from the skilled rehab facility, please have the facility set up the patient's Home Health Physical Therapy prior to being released. Also, the skilled facility will be responsible for providing the patient with their medications at time of release from the facility to include their pain medication, the muscle relaxants, and their blood thinner medication. If the patient is still at the rehab facility at time of the two week follow up appointment, the skilled rehab facility will also need to assist the patient in arranging follow up appointment in our office and any transportation needs.  MAKE SURE YOU:  Understand these instructions.  Get help right away if you are not doing well or get worse.   DENTAL ANTIBIOTICS:  In most cases prophylactic antibiotics for Dental procdeures after total joint surgery are not  necessary.  Exceptions are as follows:  1. History of prior total joint infection  2. Severely immunocompromised (Organ Transplant, cancer chemotherapy, Rheumatoid biologic meds such as Humera)  3. Poorly controlled diabetes (A1C &gt; 8.0, blood glucose over 200)  If you have one of these conditions, contact your surgeon for an antibiotic prescription, prior to your dental procedure.    Pick up stool softner and laxative for home use following surgery while on pain medications. Do not submerge incision under water. Please use good hand washing techniques while changing dressing each day. May shower starting three days after surgery. Please use a clean towel to pat the incision dry following showers. Continue to use ice for pain and swelling after surgery. Do not use any lotions or creams on the incision until instructed by your surgeon.  

## 2021-08-25 NOTE — Transfer of Care (Signed)
Immediate Anesthesia Transfer of Care Note  Patient: Sean Hill  Procedure(s) Performed: Right knee polyethylene vs total knee arthroplasty revision (Right: Knee)  Patient Location: PACU  Anesthesia Type:General  Level of Consciousness: sedated  Airway & Oxygen Therapy: Patient Spontanous Breathing and Patient connected to face mask oxygen  Post-op Assessment: Report given to RN and Post -op Vital signs reviewed and stable  Post vital signs: Reviewed and stable  Last Vitals:  Vitals Value Taken Time  BP 149/100 08/25/21 0953  Temp    Pulse 103 08/25/21 0954  Resp 19 08/25/21 0954  SpO2 90 % 08/25/21 0954  Vitals shown include unvalidated device data.  Last Pain:  Vitals:   08/25/21 0658  TempSrc: Oral  PainSc:          Complications: No notable events documented.

## 2021-08-25 NOTE — Op Note (Signed)
NAME: Sean Hill, Sean Hill MEDICAL RECORD NO: [id number] ACCOUNT NO: [id number] DATE OF BIRTH: 06-04-1941 FACILITY: [facility] LOCATION: [facility] PHYSICIAN: Dione Plover. Japleen Tornow, MD  Operative Report   DATE OF PROCEDURE: 08/25/2021  PREOPERATIVE DIAGNOSIS:  Unstable right total knee arthroplasty.  POSTOPERATIVE DIAGNOSIS:  Unstable right total knee arthroplasty.  PROCEDURE:  Right knee polyethylene revision.  SURGEON:  Dione Plover. Vere Diantonio, MD  ASSISTANT:  Fenton Foy, PA-C  ANESTHESIA:  General with adductor canal block.  ESTIMATED BLOOD LOSS:  20 mL.  DRAIN:  None.  TOURNIQUET TIME:  17 minutes at 300 mmHg.  COMPLICATIONS:  None.  CONDITION:  Stable to recovery.  BRIEF CLINIC NOTE:  The patient is a 80 year old male who had a right total knee arthroplasty done several years ago.  He had done well until he had a fall and he developed instability in the knee.  We saw him in second opinion earlier this year and he  had gross instability.  He did not have signs of any periprosthetic loosening on his radiographs.  He had a trial with the brace, which did not make a tremendous impact.  Given his worsening instability symptoms, it was felt that he would benefit from  revision arthroplasty.  We discussed the possibility of a polyethylene versus a total knee revision.  DESCRIPTION OF PROCEDURE:  After successful administration of adductor canal block and then general anesthetic, a tourniquet was placed on his right thigh, and his right lower extremity was prepped and draped in the usual sterile fashion.  Exam under  anesthesia was performed showing real significant varus and valgus laxity in full extension, 30 degrees of flexion, as well as significant AP laxity in 90 degrees of flexion.  Leg was wrapped in Esmarch, knee flexed, and the tourniquet inflated to 300  mmHg.  Midline incision was made with a 10 blade through the subcutaneous tissue to the extensor mechanism.  A fresh blade was used to  make a medial parapatellar arthrotomy.  We did not encounter fluid in the joint.  Soft tissue over the proximal medial  tibia subperiosteally elevated to the joint line with a knife and into the semimembranosus bursa with a Cobb elevator.  Soft tissue laterally was elevated with attention being paid to avoid the patellar tendon on the tibial tubercle.  Tibia subluxed  forward, and tibial polyethylene was removed.  It was a size 4 rotating platform posterior stabilized Sigma insert, which was 12.5 mm thick.  I thoroughly inspected the femoral and tibial components.  They were in excellent alignment.  They were well  fixed.  I attempted to probe them and elevate them with an osteotome, but they were extremely well fixed.  Thus, I felt that if we could achieve good balance with a thicker polyethylene then we could just accomplish our goal with a polyethylene revision,  not have to revise the entire knee.  We then trialed a 15 mm polyethylene, which still had a small amount of varus and valgus plane, and went up to 17.5 mm, which allowed for full extension with excellent varus, valgus, and anterior, posterior balance  throughout full range of motion.  The patella tracked normally, and the patellar component was well fixed and had not worn.  I removed the tibial trial and then placed the permanent size for 17.5 mm posterior stabilized rotating platform insert into the  tibial tray.  The knee was reduced with outstanding stability.  Full extension was achieved with excellent varus, valgus, and anterior, posterior balance throughout  full range of motion.  Wound was copiously irrigated with saline solution, and the  arthrotomy was closed with running 0 Stratafix suture.  Minimal bleeding was encountered and that which was encountered was stopped with electrocautery.  The tourniquet was released, total time of 17 minutes.  Subcu was then closed with interrupted 2-0  Vicryl and subcuticular running 4-0 Monocryl.   Flexion against gravity was 135 degrees.  The incision was cleaned and dried, and Steri-Strips and sterile dressing applied.  He was then awakened and transported to recovery in stable condition.  Please note, a surgical assistant was of medical necessity for this procedure to do it in a safe and expeditious manner.  Surgical assistant was necessary for retraction of vital ligaments and neurovascular structures and for proper positioning of the  limb to remove the old implant as well as to place new implant.   [facility] D: 08/25/2021 9:25:55 am T: 08/25/2021 9:59:00 am  JOB: [id number]/ [id number]

## 2021-08-25 NOTE — Evaluation (Signed)
Physical Therapy Evaluation Patient Details Name: Sean Hill MRN: [id number] DOB: [birth year] Today's Date: 08/25/2021  History of Present Illness  80 yo admitted or revision R TKA, poly exchange. PMH: RTKA, depression, GERD, bipolar, Left humeral head fracture 4 months ago.  Clinical Impression  The patient reports no pain. Patient ambulated x 180'. Patient will benefit from PT to progress to return to mod independence. Pt admitted with above diagnosis.  Pt currently with functional limitations due to the deficits listed below (see PT Problem List). Pt will benefit from skilled PT to increase their independence and safety with mobility to allow discharge to the venue listed below.          Recommendations for follow up therapy are one component of a multi-disciplinary discharge planning process, led by the attending physician.  Recommendations may be updated based on patient status, additional functional criteria and insurance authorization.  Follow Up Recommendations PT at Long-term acute care hospital    Assistance Recommended at Discharge Intermittent Supervision/Assistance  Functional Status Assessment Patient has had a recent decline in their functional status and demonstrates the ability to make significant improvements in function in a reasonable and predictable amount of time.  Equipment Recommendations  Rolling walker (2 wheels)    Recommendations for Other Services       Precautions / Restrictions Precautions Precautions: Knee;Fall Required Braces or Orthoses: Knee Immobilizer - Right Knee Immobilizer - Right: Discontinue once straight leg raise with < 10 degree lag Restrictions RLE Weight Bearing: Weight bearing as tolerated      Mobility  Bed Mobility Overal bed mobility: Needs Assistance Bed Mobility: Supine to Sit     Supine to sit: Min assist     General bed mobility comments: assist with trunk    Transfers Overall transfer level: Needs  assistance Equipment used: Rolling walker (2 wheels) Transfers: Sit to/from Stand Sit to Stand: Min assist           General transfer comment: cues for hand and right leg position    Ambulation/Gait Ambulation/Gait assistance: Min assist Gait Distance (Feet): 180 Feet Assistive device: Rolling walker (2 wheels) Gait Pattern/deviations: Step-to pattern;Step-through pattern Gait velocity: decr     General Gait Details: gait slight  decreased ballance with turns  Science writer    Modified Rankin (Stroke Patients Only)       Balance Overall balance assessment: Mild deficits observed, not formally tested                                           Pertinent Vitals/Pain Pain Assessment: No/denies pain    Home Living Family/patient expects to be discharged to:: Private residence Living Arrangements: Spouse/significant other Available Help at Discharge: Family Type of Home: House Home Access: Stairs to enter Entrance Stairs-Rails: None Technical brewer of Steps: 3   Home Layout: One level Home Equipment: None      Prior Function Prior Level of Function : Independent/Modified Independent                     Hand Dominance   Dominant Hand: Right    Extremity/Trunk Assessment   Upper Extremity Assessment Upper Extremity Assessment: Overall WFL for tasks assessed    Lower Extremity Assessment Lower Extremity Assessment: RLE deficits/detail RLE Deficits / Details: able to SLR, knee  flexed to 60       Communication   Communication: No difficulties  Cognition Arousal/Alertness: Awake/alert Behavior During Therapy: WFL for tasks assessed/performed Overall Cognitive Status: Within Functional Limits for tasks assessed                                          General Comments      Exercises     Assessment/Plan    PT Assessment Patient needs continued PT services  PT Problem  List Decreased strength;Decreased mobility;Decreased safety awareness;Decreased range of motion;Decreased activity tolerance;Decreased balance;Decreased knowledge of use of DME       PT Treatment Interventions DME instruction;Therapeutic activities;Gait training;Therapeutic exercise;Patient/family education;Stair training;Functional mobility training    PT Goals (Current goals can be found in the Care Plan section)  Acute Rehab PT Goals Patient Stated Goal: home PT Goal Formulation: With patient Time For Goal Achievement: 09/01/21 Potential to Achieve Goals: Good    Frequency 7X/week   Barriers to discharge        Co-evaluation               AM-PAC PT "6 Clicks" Mobility  Outcome Measure Help needed turning from your back to your side while in a flat bed without using bedrails?: A Little Help needed moving from lying on your back to sitting on the side of a flat bed without using bedrails?: A Little Help needed moving to and from a bed to a chair (including a wheelchair)?: A Little Help needed standing up from a chair using your arms (e.g., wheelchair or bedside chair)?: A Little Help needed to walk in hospital room?: A Lot Help needed climbing 3-5 steps with a railing? : A Lot 6 Click Score: 16    End of Session Equipment Utilized During Treatment: Gait belt;Right knee immobilizer Activity Tolerance: Patient tolerated treatment well Patient left: in chair;with call bell/phone within reach;with chair alarm set Nurse Communication: Mobility status PT Visit Diagnosis: Unsteadiness on feet (R26.81);Muscle weakness (generalized) (M62.81);Difficulty in walking, not elsewhere classified (R26.2)    Time: 4010-2725 PT Time Calculation (min) (ACUTE ONLY): 24 min   Charges:   PT Evaluation $PT Eval Low Complexity: 1 Low PT Treatments $Gait Training: 8-22 mins        Tresa Endo PT Acute Rehabilitation Services Pager [phone] Office [phone]   Claretha Cooper 08/25/2021, 5:11 PM

## 2021-08-25 NOTE — Progress Notes (Signed)
Orthopedic Tech Progress Note Patient Details:  Sean Hill 1941-02-14 [id number]  Patient ID: Sean Hill, male   DOB: December 07, 1940, 80 y.o.   MRN: [id number]  [name] 08/25/2021, 2:22 PM Cpm removed

## 2021-08-25 NOTE — Anesthesia Postprocedure Evaluation (Signed)
Anesthesia Post Note  Patient: Sean Hill  Procedure(s) Performed: Right knee polyethylene vs total knee arthroplasty revision (Right: Knee)     Patient location during evaluation: PACU Anesthesia Type: Regional and General Level of consciousness: awake and alert Pain management: pain level controlled Vital Signs Assessment: post-procedure vital signs reviewed and stable Respiratory status: spontaneous breathing, nonlabored ventilation, respiratory function stable and patient connected to nasal cannula oxygen Cardiovascular status: blood pressure returned to baseline and stable Postop Assessment: no apparent nausea or vomiting Anesthetic complications: no   No notable events documented.  Last Vitals:  Vitals:   08/25/21 1225 08/25/21 1424  BP: (!) 141/79 130/68  Pulse: 88 87  Resp: 16 18  Temp: 37 C   SpO2: 93% 92%    Last Pain:  Vitals:   08/25/21 1415  TempSrc:   PainSc: 3                  Micheal Murad L Mairely Foxworth

## 2021-08-25 NOTE — Anesthesia Procedure Notes (Signed)
Anesthesia Regional Block: Adductor canal block   Pre-Anesthetic Checklist: , timeout performed,  Correct Patient, Correct Site, Correct Laterality,  Correct Procedure, Correct Position, site marked,  Risks and benefits discussed,  Surgical consent,  Pre-op evaluation,  At surgeon's request and post-op pain management  Laterality: Right  Prep: Maximum Sterile Barrier Precautions used, chloraprep       Needles:  Injection technique: Single-shot  Needle Type: Echogenic Stimulator Needle     Needle Length: 9cm  Needle Gauge: 22     Additional Needles:   Procedures:,,,, ultrasound used (permanent image in chart),,    Narrative:  Start time: 08/25/2021 8:00 AM End time: 08/25/2021 8:05 AM Injection made incrementally with aspirations every 5 mL.  Performed by: Personally  Anesthesiologist: Freddrick March, MD  Additional Notes: Monitors applied. No increased pain on injection. No increased resistance to injection. Injection made in 5cc increments. Good needle visualization. Patient tolerated procedure well.

## 2021-08-25 NOTE — Anesthesia Procedure Notes (Signed)
Procedure Name: Intubation Date/Time: 08/25/2021 8:38 AM Performed by: Talbot Grumbling, CRNA Pre-anesthesia Checklist: Patient identified, Emergency Drugs available, Suction available and Patient being monitored Patient Re-evaluated:Patient Re-evaluated prior to induction Oxygen Delivery Method: Circle system utilized Preoxygenation: Pre-oxygenation with 100% oxygen Induction Type: IV induction Ventilation: Mask ventilation without difficulty and Oral airway inserted - appropriate to patient size Laryngoscope Size: Mac and 4 Grade View: Grade II Tube type: Oral Tube size: 7.5 mm Number of attempts: 1 Airway Equipment and Method: Stylet Placement Confirmation: ETT inserted through vocal cords under direct vision, positive ETCO2 and breath sounds checked- equal and bilateral Secured at: 23 cm Tube secured with: Tape Dental Injury: Teeth and Oropharynx as per pre-operative assessment

## 2021-08-25 NOTE — Brief Op Note (Signed)
08/25/2021  9:18 AM  PATIENT:  [name]  80 y.o. male  PRE-OPERATIVE DIAGNOSIS:  unstable right total knee athroplasty  POST-OPERATIVE DIAGNOSIS:  unstable right total knee athroplasty  PROCEDURE:  Procedure(s): Right knee polyethylene vs total knee arthroplasty revision (Right)  SURGEON:  Surgeon(s) and Role:    Gaynelle Arabian, MD - Primary  PHYSICIAN ASSISTANT:   ASSISTANTS: Fenton Foy, PA-C   ANESTHESIA:   general and adductor canal block  EBL:  25 ml   BLOOD ADMINISTERED:none  DRAINS: none   LOCAL MEDICATIONS USED:  OTHER Exparel  COUNTS:  YES  TOURNIQUET:  17 minutes @ 300 mm HG  DICTATION: .Other Dictation: Dictation Number [id number]  PLAN OF CARE: Admit for overnight observation  PATIENT DISPOSITION:  PACU - hemodynamically stable.

## 2021-08-26 ENCOUNTER — Encounter (HOSPITAL_COMMUNITY): Payer: Self-pay | Admitting: Orthopedic Surgery

## 2021-08-26 DIAGNOSIS — M1711 Unilateral primary osteoarthritis, right knee: Secondary | ICD-10-CM | POA: Diagnosis not present

## 2021-08-26 DIAGNOSIS — Z96651 Presence of right artificial knee joint: Secondary | ICD-10-CM | POA: Diagnosis not present

## 2021-08-26 LAB — BASIC METABOLIC PANEL
Anion gap: 9 (ref 5–15)
BUN: 17 mg/dL (ref 8–23)
CO2: 23 mmol/L (ref 22–32)
Calcium: 8.8 mg/dL — ABNORMAL LOW (ref 8.9–10.3)
Chloride: 107 mmol/L (ref 98–111)
Creatinine, Ser: 0.98 mg/dL (ref 0.61–1.24)
GFR, Estimated: >60 mL/min (ref >60)
Glucose, Bld: 186 mg/dL — ABNORMAL HIGH (ref 70–99)
Potassium: 4.2 mmol/L (ref 3.5–5.1)
Sodium: 139 mmol/L (ref 135–145)

## 2021-08-26 LAB — CBC
HCT: 41.2 % (ref 39.0–52.0)
Hemoglobin: 13.7 g/dL (ref 13.0–17.0)
MCH: 29.4 pg (ref 26.0–34.0)
MCHC: 33.3 g/dL (ref 30.0–36.0)
MCV: 88.4 fL (ref 80.0–100.0)
Platelets: 219 10*3/uL (ref 150–400)
RBC: 4.66 MIL/uL (ref 4.22–5.81)
RDW: 14.8 % (ref 11.5–15.5)
WBC: 12.7 10*3/uL — ABNORMAL HIGH (ref 4.0–10.5)
nRBC: 0 % (ref 0.0–0.2)

## 2021-08-26 MED ORDER — OXYCODONE HCL 5 MG PO TABS: 5.0000 mg | ORAL_TABLET | Freq: Four times a day (QID) | ORAL | 0 refills | Status: DC | PRN

## 2021-08-26 MED ORDER — METHOCARBAMOL 500 MG PO TABS
500.0000 mg | ORAL_TABLET | Freq: Four times a day (QID) | ORAL | 0 refills | Status: DC | PRN
Start: 2021-08-26 — End: 2021-09-23

## 2021-08-26 MED ORDER — ASPIRIN 325 MG PO TBEC: 325.0000 mg | DELAYED_RELEASE_TABLET | Freq: Two times a day (BID) | ORAL | 0 refills | Status: AC

## 2021-08-26 NOTE — TOC Transition Note (Signed)
Transition of Care [facility]) - CM/SW Discharge Note  Patient Details  Name: Sean Hill MRN: [id number] Date of Birth: [birth year]  [facility] CM/SW Contact:  Sherie Don, LCSW Phone Number: 08/26/2021, 10:04 AM  Clinical Narrative: Patient is expected to discharge home after working with PT. CSW met with patient and his wife to review discharge plan and needs. Patient will discharge home with OPPT at [facility] with the first appointment scheduled for 08/27/21. Patient will need a rolling walker. [employer] delivered walker to patient's room. TOC signing off.  Final next level of care: OP Rehab Barriers to Discharge: No Barriers Identified  Patient Goals and CMS Choice Patient states their goals for this hospitalization and ongoing recovery are:: Discharge home with OPPT at [facility] CMS Medicare.gov Compare Post Acute Care list provided to:: Patient Choice offered to / list presented to : Patient  Discharge Plan and Services       DME Arranged: Walker rolling DME Agency: [employer] Representative spoke with at DME Agency: Prearranged in orthopedist's office  Readmission Risk Interventions No flowsheet data found.

## 2021-08-26 NOTE — Progress Notes (Signed)
Provided discharge education/instructions, all questions and concerns addressed, Pt not in acute distress, RW delivered to room, discharged home with belongings accompanied by wife.

## 2021-08-26 NOTE — Progress Notes (Signed)
Physical Therapy Treatment Patient Details Name: Sean Hill MRN: [id number] DOB: 12-31-1940 Today's Date: 08/26/2021   History of Present Illness 80 yo admitted or revision R TKA, poly exchange. PMH: RTKA, depression, GERD, bipolar, Left humeral head fracture 4 months ago.    PT Comments    The patient is progressing well. Reports no pain. Patient  has achieved PT goals. RN notified.    Recommendations for follow up therapy are one component of a multi-disciplinary discharge planning process, led by the attending physician.  Recommendations may be updated based on patient status, additional functional criteria and insurance authorization.  Follow Up Recommendations  Follow physician's recommendations for discharge plan and follow up therapies     Assistance Recommended at Discharge Intermittent Supervision/Assistance  Equipment Recommendations  Rolling walker (2 wheels)    Recommendations for Other Services       Precautions / Restrictions Precautions Precautions: Knee;Fall Precaution Comments: no need for KI Restrictions Weight Bearing Restrictions: Yes RLE Weight Bearing: Weight bearing as tolerated     Mobility  Bed Mobility   Bed Mobility: Supine to Sit     Supine to sit: Supervision          Transfers Overall transfer level: Needs assistance Equipment used: Rolling walker (2 wheels) Transfers: Sit to/from Stand Sit to Stand: Min guard           General transfer comment: cues for hand and right leg position    Ambulation/Gait Ambulation/Gait assistance: Min guard Gait Distance (Feet): 180 Feet Assistive device: Rolling walker (2 wheels)   Gait velocity: decr     General Gait Details: steady gait without KI, no knee buckling noted   Stairs Stairs: Yes Stairs assistance: Min assist Stair Management: One rail Left;Forwards;With cane Number of Stairs: 2 General stair comments: wife present to assist and be instructed, teach back  performed   Wheelchair Mobility    Modified Rankin (Stroke Patients Only)       Balance Overall balance assessment: Mild deficits observed, not formally tested                                          Cognition Arousal/Alertness: Awake/alert Behavior During Therapy: WFL for tasks assessed/performed Overall Cognitive Status: Within Functional Limits for tasks assessed                                          Exercises Total Joint Exercises Ankle Circles/Pumps: AROM;Both;10 reps Quad Sets: AROM;Right;10 reps;Supine Heel Slides: AAROM;Right;10 reps;Supine Hip ABduction/ADduction: Right;10 reps;Supine;AROM Straight Leg Raises: AROM;Right;10 reps;Supine Long Arc Quad: AROM;Right;10 reps;Supine Goniometric ROM: 0-90 right knee flex    General Comments        Pertinent Vitals/Pain Pain Assessment: 0-10 Pain Score: 1  Pain Location: right knee Pain Descriptors / Indicators: Discomfort Pain Intervention(s): Monitored during session    Home Living                          Prior Function            PT Goals (current goals can now be found in the care plan section) Progress towards PT goals: Progressing toward goals    Frequency    7X/week      PT Plan Current plan remains  appropriate    Co-evaluation              AM-PAC PT "6 Clicks" Mobility   Outcome Measure  Help needed turning from your back to your side while in a flat bed without using bedrails?: A Little Help needed moving from lying on your back to sitting on the side of a flat bed without using bedrails?: A Little Help needed moving to and from a bed to a chair (including a wheelchair)?: A Little Help needed standing up from a chair using your arms (e.g., wheelchair or bedside chair)?: A Little Help needed to walk in hospital room?: A Little Help needed climbing 3-5 steps with a railing? : A Little 6 Click Score: 18    End of Session Equipment  Utilized During Treatment: Gait belt Activity Tolerance: Patient tolerated treatment well Patient left: in chair;with call bell/phone within reach;with family/visitor present Nurse Communication: Mobility status (PT goals met) PT Visit Diagnosis: Unsteadiness on feet (R26.81);Muscle weakness (generalized) (M62.81);Difficulty in walking, not elsewhere classified (R26.2)     Time: 1117-3567 PT Time Calculation (min) (ACUTE ONLY): 30 min  Charges:  $Gait Training: 8-22 mins $Therapeutic Exercise: 8-22 mins                     Tresa Endo PT Acute Rehabilitation Services Pager [phone] Office [phone]    Claretha Cooper 08/26/2021, 10:44 AM

## 2021-08-26 NOTE — Plan of Care (Signed)

## 2021-08-26 NOTE — Progress Notes (Signed)
   Subjective: 1 Day Post-Op Procedure(s) (LRB): Right knee polyethylene vs total knee arthroplasty revision (Right) Patient reports pain as mild.   Patient seen in rounds by Dr. Wynelle Link. Patient is well, and has had no acute complaints or problems. States he is ready to go home. Voiding without difficulty, no issues overnight. Denies chest pain, SOB, or calf pain.  We will begin therapy today.   Objective: Vital signs in last 24 hours: Temp:  [97.4 F (36.3 C)-98.6 F (37 C)] 97.6 F (36.4 C) (12/08 0445) Pulse Rate:  [81-105] 81 (12/08 0445) Resp:  [14-18] 16 (12/08 0445) BP: (128-163)/(65-100) 132/79 (12/08 0445) SpO2:  [87 %-95 %] 95 % (12/08 0445) FiO2 (%):  [36 %] 36 % (12/07 1225)  Intake/Output from previous day:  Intake/Output Summary (Last 24 hours) at 08/26/2021 0733 Last data filed at 08/26/2021 0600 Gross per 24 hour  Intake 2270 ml  Output 750 ml  Net 1520 ml     Intake/Output this shift: No intake/output data recorded.  Labs: Recent Labs    08/26/21 0337  HGB 13.7   Recent Labs    08/26/21 0337  WBC 12.7*  RBC 4.66  HCT 41.2  PLT 219   Recent Labs    08/26/21 0337  NA 139  K 4.2  CL 107  CO2 23  BUN 17  CREATININE 0.98  GLUCOSE 186*  CALCIUM 8.8*   No results for input(s): LABPT, INR in the last 72 hours.  Exam: General - Patient is Alert and Oriented Extremity - Neurologically intact Neurovascular intact Sensation intact distally Dorsiflexion/Plantar flexion intact Dressing - dressing C/D/I Motor Function - intact, moving foot and toes well on exam.   Past Medical History:  Diagnosis Date   Anal fissure    Anxiety    Arthritis    right wrist; pt. states "everywhere"   Asthma    Bipolar disorder ([place])    Cataract    Depression    GERD (gastroesophageal reflux disease)    Hemorrhage of colon following colonoscopy 10/08/2019   History of kidney stones    History of MRSA infection    left knee after arthroplasty    Hyperlipidemia    Hypertension    states under control with med., has been on med. x 1 yr.   Impaired hearing    Left bundle branch block (LBBB) on electrocardiogram 10/29/2015   Macular degeneration (senile) of retina    left   Pre-diabetes    Wears glasses     Assessment/Plan: 1 Day Post-Op Procedure(s) (LRB): Right knee polyethylene vs total knee arthroplasty revision (Right) Principal Problem:   Failed total knee arthroplasty (HCC) Active Problems:   Failed total right knee replacement (HCC)  Estimated body mass index is 40.79 kg/m as calculated from the following:   Height as of this encounter: 5\' 8"  (1.727 m).   Weight as of this encounter: 121.7 kg. Advance diet Up with therapy D/C IV fluids  DVT Prophylaxis - Aspirin Weight bearing as tolerated. Begin therapy.  Plan is to go Home after hospital stay. Plan for discharge today once meeting goals with PT. Scheduled for OPPT at [facility] Follow-up in the office in 2 weeks  The PDMP database was reviewed today prior to any opioid medications being prescribed to this patient.  Theresa Duty, PA-C Orthopedic Surgery [phone] 08/26/2021, 7:33 AM

## 2021-08-27 DIAGNOSIS — M25661 Stiffness of right knee, not elsewhere classified: Secondary | ICD-10-CM | POA: Diagnosis not present

## 2021-08-27 DIAGNOSIS — M25561 Pain in right knee: Secondary | ICD-10-CM | POA: Diagnosis not present

## 2021-08-30 NOTE — Discharge Summary (Signed)
Physician Discharge Summary   Patient ID: Sean Hill MRN: [id number] DOB/AGE: 01/21/1941 80 y.o.  Admit date: 08/25/2021 Discharge date: 08/26/2021  Primary Diagnosis: Unstable right total knee arthroplasty   Admission Diagnoses:  Past Medical History:  Diagnosis Date   Anal fissure    Anxiety    Arthritis    right wrist; pt. states "everywhere"   Asthma    Bipolar disorder ([place])    Cataract    Depression    GERD (gastroesophageal reflux disease)    Hemorrhage of colon following colonoscopy 10/08/2019   History of kidney stones    History of MRSA infection    left knee after arthroplasty   Hyperlipidemia    Hypertension    states under control with med., has been on med. x 1 yr.   Impaired hearing    Left bundle branch block (LBBB) on electrocardiogram 10/29/2015   Macular degeneration (senile) of retina    left   Pre-diabetes    Wears glasses    Discharge Diagnoses:   Principal Problem:   Failed total knee arthroplasty ([place]) Active Problems:   Failed total right knee replacement ([place])  Estimated body mass index is 40.79 kg/m as calculated from the following:   Height as of this encounter: 5\' 8"  (1.727 m).   Weight as of this encounter: 121.7 kg.  Procedure:  Procedure(s) (LRB): Right knee polyethylene vs total knee arthroplasty revision (Right)   Consults: None  HPI: The patient is a 80 year old male who had a right total knee arthroplasty done several years ago.  He had done well until he had a fall and he developed instability in the knee.  We saw him in second opinion earlier this year and he  had gross instability.  He did not have signs of any periprosthetic loosening on his radiographs.  He had a trial with the brace, which did not make a tremendous impact.  Given his worsening instability symptoms, it was felt that he would benefit from  revision arthroplasty.  We discussed the possibility of a polyethylene versus a total knee revision.     Laboratory Data: Admission on 08/25/2021, Discharged on 08/26/2021  Component Date Value Ref Range Status   Glucose-Capillary 08/25/2021 133 (H)  70 - 99 mg/dL Final   Glucose reference range applies only to samples taken after fasting for at least 8 hours.   Comment 1 08/25/2021 Notify RN   Final   Comment 2 08/25/2021 Document in Chart   Final   WBC 08/26/2021 12.7 (H)  4.0 - 10.5 K/uL Final   RBC 08/26/2021 4.66  4.22 - 5.81 MIL/uL Final   Hemoglobin 08/26/2021 13.7  13.0 - 17.0 g/dL Final   HCT 08/26/2021 41.2  39.0 - 52.0 % Final   MCV 08/26/2021 88.4  80.0 - 100.0 fL Final   MCH 08/26/2021 29.4  26.0 - 34.0 pg Final   MCHC 08/26/2021 33.3  30.0 - 36.0 g/dL Final   RDW 08/26/2021 14.8  11.5 - 15.5 % Final   Platelets 08/26/2021 219  150 - 400 K/uL Final   nRBC 08/26/2021 0.0  0.0 - 0.2 % Final   Performed at [facility], [place] [address], [place], [place] [address]   Sodium 08/26/2021 139  135 - 145 mmol/L Final   Potassium 08/26/2021 4.2  3.5 - 5.1 mmol/L Final   Chloride 08/26/2021 107  98 - 111 mmol/L Final   CO2 08/26/2021 23  22 - 32 mmol/L Final   Glucose,  Bld 08/26/2021 186 (H)  70 - 99 mg/dL Final   Glucose reference range applies only to samples taken after fasting for at least 8 hours.   BUN 08/26/2021 17  8 - 23 mg/dL Final   Creatinine, Ser 08/26/2021 0.98  0.61 - 1.24 mg/dL Final   Calcium 08/26/2021 8.8 (L)  8.9 - 10.3 mg/dL Final   GFR, Estimated 08/26/2021 >60  >60 mL/min Final   Comment: (NOTE) Calculated using the CKD-EPI Creatinine Equation (2021)    Anion gap 08/26/2021 9  5 - 15 Final   Performed at [facility], [place] [address] [place], [place] [address]  Orders Only on 08/23/2021  Component Date Value Ref Range Status   SARS Coronavirus 2 08/23/2021 RESULT: NEGATIVE   Final   Comment: RESULT: NEGATIVESARS-CoV-2 INTERPRETATION:A NEGATIVE  test result means that SARS-CoV-2 RNA was not present in the specimen above  the limit of detection of this test. This does not preclude a possible SARS-CoV-2 infection and should not be used as the  sole basis for patient management decisions. Negative results must be combined with clinical observations, patient history, and epidemiological information. Optimum specimen types and timing for peak viral levels during infections caused by SARS-CoV-2  have not been determined. Collection of multiple specimens or types of specimens may be necessary to detect virus. Improper specimen collection and handling, sequence variability under primers/probes, or organism present below the limit of detection may  lead to false negative results. Positive and negative predictive values of testing are highly dependent on prevalence. False negative test results are more likely when prevalence of disease is high.The expected result is NEGATIVE.Fact S                          heet for  Healthcare Providers: LocalChronicle.no Sheet for Patients: SalonLookup.es Reference Range - Negative   Hospital Outpatient Visit on 08/16/2021  Component Date Value Ref Range Status   WBC 08/16/2021 4.4  4.0 - 10.5 K/uL Final   RBC 08/16/2021 5.07  4.22 - 5.81 MIL/uL Final   Hemoglobin 08/16/2021 14.9  13.0 - 17.0 g/dL Final   HCT 08/16/2021 44.4  39.0 - 52.0 % Final   MCV 08/16/2021 87.6  80.0 - 100.0 fL Final   MCH 08/16/2021 29.4  26.0 - 34.0 pg Final   MCHC 08/16/2021 33.6  30.0 - 36.0 g/dL Final   RDW 08/16/2021 14.8  11.5 - 15.5 % Final   Platelets 08/16/2021 233  150 - 400 K/uL Final   nRBC 08/16/2021 0.0  0.0 - 0.2 % Final   Performed at [facility], [place] [address] [place], [place] [address]   Sodium 08/16/2021 138  135 - 145 mmol/L Final   Potassium 08/16/2021 4.3  3.5 - 5.1 mmol/L Final   Chloride 08/16/2021 105  98 - 111 mmol/L Final   CO2 08/16/2021 23  22 - 32 mmol/L Final   Glucose, Bld 08/16/2021 119 (H)  70 -  99 mg/dL Final   Glucose reference range applies only to samples taken after fasting for at least 8 hours.   BUN 08/16/2021 18  8 - 23 mg/dL Final   Creatinine, Ser 08/16/2021 0.91  0.61 - 1.24 mg/dL Final   Calcium 08/16/2021 9.2  8.9 - 10.3 mg/dL Final   Total Protein 08/16/2021 6.8  6.5 - 8.1 g/dL Final   Albumin 08/16/2021 4.1  3.5 - 5.0 g/dL Final   AST 08/16/2021 43 (H)  15 - 41 U/L Final   ALT 08/16/2021 22  0 - 44 U/L Final   Alkaline Phosphatase 08/16/2021 71  38 - 126 U/L Final   Total Bilirubin 08/16/2021 0.8  0.3 - 1.2 mg/dL Final   GFR, Estimated 08/16/2021 >60  >60 mL/min Final   Comment: (NOTE) Calculated using the CKD-EPI Creatinine Equation (2021)    Anion gap 08/16/2021 10  5 - 15 Final   Performed at [facility], [place] [address] [place], [place] [address]   MRSA, PCR 08/16/2021 NEGATIVE  NEGATIVE Final   Staphylococcus aureus 08/16/2021 POSITIVE (A)  NEGATIVE Final   Comment: (NOTE) The Xpert SA Assay (FDA approved for NASAL specimens in patients 32 years of age and older), is one component of a comprehensive surveillance program. It is not intended to diagnose infection nor to guide or monitor treatment. Performed at [facility], [place] [address] [place], [place] [address]    Prothrombin Time 08/16/2021 14.0  11.4 - 15.2 seconds Final   INR 08/16/2021 1.1  0.8 - 1.2 Final   Comment: (NOTE) INR goal varies based on device and disease states. Performed at [facility], [place] [address], [place], [place] [address]    Hgb A1c MFr Bld 08/16/2021 6.0 (H)  4.8 - 5.6 % Final   Comment: (NOTE)         Prediabetes: 5.7 - 6.4         Diabetes: >6.4         Glycemic control for adults with diabetes: <7.0    Mean Plasma Glucose 08/16/2021 126  mg/dL Final   Comment: (NOTE) Performed At: [facility] [place], [place] [id number] Rush Farmer MD [id number]    Glucose-Capillary  08/16/2021 140 (H)  70 - 99 mg/dL Final   Glucose reference range applies only to samples taken after fasting for at least 8 hours.  Appointment on 07/26/2021  Component Date Value Ref Range Status   Cholesterol 07/26/2021 195  0 - 200 mg/dL Final   ATP III Classification       Desirable:  < 200 mg/dL               Borderline High:  200 - 239 mg/dL          High:  > = 240 mg/dL   Triglycerides 07/26/2021 354.0 (H)  0.0 - 149.0 mg/dL Final   Normal:  <150 mg/dLBorderline High:  150 - 199 mg/dL   HDL 07/26/2021 34.50 (L)  >39.00 mg/dL Final   VLDL 07/26/2021 70.8 (H)  0.0 - 40.0 mg/dL Final   Total CHOL/HDL Ratio 07/26/2021 6   Final                  Men          Women1/2 Average Risk     3.4          3.3Average Risk          5.0          4.42X Average Risk          9.6          7.13X Average Risk          15.0          11.0                       NonHDL 07/26/2021 160.14   Final   NOTE:  Non-HDL goal should be 30 mg/dL higher than patient's LDL goal (i.e. LDL goal of < 70 mg/dL, would have non-HDL goal of < 100 mg/dL)   Direct LDL 07/26/2021 112.0  mg/dL Final   Optimal:  <100 mg/dLNear or Above Optimal:  100-129 mg/dLBorderline High:  130-159 mg/dLHigh:  160-189 mg/dLVery High:  >190 mg/dL     X-Rays:No results found.  EKG: Orders placed or performed during the hospital encounter of 05/23/21   EKG 12-Lead   EKG 12-Lead     Hospital Course: Sean Hill is a 80 y.o. who was admitted to [facility]. They were brought to the operating room on 08/25/2021 and underwent Procedure(s): Right knee polyethylene vs total knee arthroplasty revision.  Patient tolerated the procedure well and was later transferred to the recovery room and then to the orthopaedic floor for postoperative care. They were given PO and IV analgesics for pain control following their surgery. They were given 24 hours of postoperative antibiotics of  Anti-infectives (From admission, onward)    Start      Dose/Rate Route Frequency Ordered Stop   08/25/21 1400  ceFAZolin (ANCEF) IVPB 2g/100 mL premix        2 g 200 mL/hr over 30 Minutes Intravenous Every 6 hours 08/25/21 0942 08/25/21 2057   08/25/21 0741  ceFAZolin (ANCEF) 3-0.9 GM/100ML-% IVPB       Note to Pharmacy: Georgena Spurling W: cabinet override      08/25/21 0741 08/25/21 1944   08/25/21 0615  ceFAZolin (ANCEF) IVPB 2g/100 mL premix        2 g 200 mL/hr over 30 Minutes Intravenous On call to O.R. 08/25/21 6073 08/25/21 7106      and started on DVT prophylaxis in the form of Aspirin.   PT and OT were ordered for total joint protocol. Discharge planning consulted to help with postop disposition and equipment needs.  Patient had a good night on the evening of surgery. They started to get up OOB with therapy on POD #1. Pt was seen during rounds and was ready to go home pending progress with therapy. He worked with therapy on POD #1 and was meeting his goals. Pt was discharged to home later that day in stable condition.  Diet: Regular diet Activity: WBAT Follow-up: in 2 weeks Disposition: Home with OPPT Discharged Condition: stable   Discharge Instructions     Call MD / Call 911   Complete by: As directed    If you experience chest pain or shortness of breath, CALL 911 and be transported to the hospital emergency room.  If you develope a fever above 101 F, pus (white drainage) or increased drainage or redness at the wound, or calf pain, call your surgeon's office.   Change dressing   Complete by: As directed    You may remove the bulky bandage (ACE wrap and gauze) two days after surgery. You will have an adhesive waterproof bandage underneath. Leave this in place until your first follow-up appointment.   Constipation Prevention   Complete by: As directed    Drink plenty of fluids.  Prune juice may be helpful.  You may use a stool softener, such as Colace (over the counter) 100 mg twice a day.  Use MiraLax (over the counter) for  constipation as needed.   Diet - low sodium heart healthy   Complete by: As directed    Do not put a pillow under the knee. Place it under the heel.   Complete  by: As directed    Driving restrictions   Complete by: As directed    No driving for two weeks   Post-operative opioid taper instructions:   Complete by: As directed    POST-OPERATIVE OPIOID TAPER INSTRUCTIONS: It is important to wean off of your opioid medication as soon as possible. If you do not need pain medication after your surgery it is ok to stop day one. Opioids include: Codeine, Hydrocodone(Norco, Vicodin), Oxycodone(Percocet, oxycontin) and hydromorphone amongst others.  Long term and even short term use of opiods can cause: Increased pain response Dependence Constipation Depression Respiratory depression And more.  Withdrawal symptoms can include Flu like symptoms Nausea, vomiting And more Techniques to manage these symptoms Hydrate well Eat regular healthy meals Stay active Use relaxation techniques(deep breathing, meditating, yoga) Do Not substitute Alcohol to help with tapering If you have been on opioids for less than two weeks and do not have pain than it is ok to stop all together.  Plan to wean off of opioids This plan should start within one week post op of your joint replacement. Maintain the same interval or time between taking each dose and first decrease the dose.  Cut the total daily intake of opioids by one tablet each day Next start to increase the time between doses. The last dose that should be eliminated is the evening dose.      TED hose   Complete by: As directed    Use stockings (TED hose) for three weeks on both leg(s).  You may remove them at night for sleeping.   Weight bearing as tolerated   Complete by: As directed       Allergies as of 08/26/2021       Reactions   Penicillins Other (See Comments)   BLISTERS Did it involve swelling of the face/tongue/throat, SOB, or low  BP? No Did it involve sudden or severe rash/hives, skin peeling, or any reaction on the inside of your mouth or nose? No Did you need to seek medical attention at a hospital or doctor's office? No When did it last happen? around 1980    If all above answers are "NO", may proceed with cephalosporin use. Tolerated Cephalosporin Date: 08/26/21.   Lisinopril Diarrhea, Cough   Dry cough, abdominal bloating and diarrhea   Codeine Rash        Medication List     STOP taking these medications    HYDROcodone bit-homatropine 5-1.5 MG/5ML syrup Commonly known as: HYCODAN   predniSONE 20 MG tablet Commonly known as: DELTASONE       TAKE these medications    albuterol 108 (90 Base) MCG/ACT inhaler Commonly known as: VENTOLIN HFA Inhale 2 puffs into the lungs every 6 (six) hours as needed for wheezing or shortness of breath. Notes to patient: Resume home regimen   ARIPiprazole ER 400 MG Prsy prefilled syringe Commonly known as: ABILIFY MAINTENA Inject 400 mg into the muscle every 28 (twenty-eight) days.   aspirin 325 MG EC tablet Take 1 tablet (325 mg total) by mouth 2 (two) times daily for 20 days. Then take one 81 mg aspirin once a day for three weeks. Then discontinue aspirin.   benztropine 0.5 MG tablet Commonly known as: COGENTIN Take 1 tablet (0.5 mg total) by mouth daily.   cetirizine 10 MG tablet Commonly known as: ZYRTEC Take 10 mg by mouth daily. Notes to patient: Resume home regimen   citalopram 20 MG tablet Commonly known as: CeleXA Take 1 tablet (20  mg total) by mouth daily.   citalopram 10 MG tablet Commonly known as: CELEXA Take 20 mg by mouth daily. Notes to patient: Resume home regimen   ezetimibe 10 MG tablet Commonly known as: ZETIA TAKE ONE TABLET BY MOUTH AT NOON   fenofibrate 145 MG tablet Commonly known as: Tricor Take 1 tablet (145 mg total) by mouth daily.   Fish Oil 1000 MG Caps Take 1,000 mg by mouth in the morning and at  bedtime. Notes to patient: Resume home regimen   losartan 50 MG tablet Commonly known as: COZAAR TAKE ONE TABLET BY MOUTH EVERY MORNING   meclizine 25 MG tablet Commonly known as: ANTIVERT Take 1 tablet (25 mg total) by mouth 3 (three) times daily as needed for dizziness. Notes to patient: Resume home regimen   methocarbamol 500 MG tablet Commonly known as: ROBAXIN Take 1 tablet (500 mg total) by mouth every 6 (six) hours as needed for muscle spasms. Notes to patient: Last dose given 12/08 04:39am   multivitamin with minerals Tabs tablet Take 1 tablet by mouth daily. Notes to patient: Resume home regimen   oxyCODONE 5 MG immediate release tablet Commonly known as: Oxy IR/ROXICODONE Take 1-2 tablets (5-10 mg total) by mouth every 6 (six) hours as needed for severe pain or moderate pain.   pantoprazole 40 MG tablet Commonly known as: PROTONIX Take 1 tablet (40 mg total) by mouth 2 (two) times daily.   PRESERVISION AREDS 2 PO Take 1 capsule by mouth 2 (two) times daily. Notes to patient: Resume home regimen   temazepam 30 MG capsule Commonly known as: RESTORIL Take 1 capsule (30 mg total) by mouth at bedtime.   Vitamin D (Cholecalciferol) 25 MCG (1000 UT) Tabs Take 1,000 Units by mouth daily. Notes to patient: Resume home regimen               Discharge Care Instructions  (From admission, onward)           Start     Ordered   08/26/21 0000  Weight bearing as tolerated        08/26/21 0737   08/26/21 0000  Change dressing       Comments: You may remove the bulky bandage (ACE wrap and gauze) two days after surgery. You will have an adhesive waterproof bandage underneath. Leave this in place until your first follow-up appointment.   08/26/21 0737            Follow-up Information     Gaynelle Arabian, MD. Schedule an appointment as soon as possible for a visit in 2 week(s).   Specialty: Orthopedic Surgery Contact information: [address] [place]  [place] [address] [phone]                 Signed: Theresa Duty, PA-C Orthopedic Surgery 08/30/2021, 11:23 AM

## 2021-09-02 DIAGNOSIS — M25661 Stiffness of right knee, not elsewhere classified: Secondary | ICD-10-CM | POA: Diagnosis not present

## 2021-09-02 DIAGNOSIS — M25561 Pain in right knee: Secondary | ICD-10-CM | POA: Diagnosis not present

## 2021-09-03 ENCOUNTER — Encounter: Payer: Self-pay | Admitting: Adult Health

## 2021-09-03 ENCOUNTER — Ambulatory Visit (INDEPENDENT_AMBULATORY_CARE_PROVIDER_SITE_OTHER): Payer: Medicare HMO | Admitting: Adult Health

## 2021-09-03 VITALS — BP 140/80 | HR 87 | Temp 98.6°F | Ht 68.0 in | Wt 273.0 lb

## 2021-09-03 DIAGNOSIS — J4541 Moderate persistent asthma with (acute) exacerbation: Secondary | ICD-10-CM

## 2021-09-03 MED ORDER — IPRATROPIUM-ALBUTEROL 0.5-2.5 (3) MG/3ML IN SOLN: 3.0000 mL | Freq: Four times a day (QID) | RESPIRATORY_TRACT | 0 refills | Status: DC | PRN

## 2021-09-03 MED ORDER — HYDROCODONE BIT-HOMATROP MBR 5-1.5 MG/5ML PO SOLN
5.0000 mL | Freq: Three times a day (TID) | ORAL | 0 refills | Status: DC | PRN
Start: 2021-09-03 — End: 2021-09-23

## 2021-09-03 MED ORDER — IPRATROPIUM-ALBUTEROL 0.5-2.5 (3) MG/3ML IN SOLN
3.0000 mL | Freq: Once | RESPIRATORY_TRACT | Status: AC
  Administered 2021-09-03: 3 mL via RESPIRATORY_TRACT

## 2021-09-03 NOTE — Progress Notes (Signed)
Subjective:    Patient ID: Sean Hill, male    DOB: [birth year], 80 y.o.   MRN: [id number]  HPI 80 year old male who  has a past medical history of Anal fissure, Anxiety, Arthritis, Asthma, Bipolar disorder ([place]), Cataract, Depression, GERD (gastroesophageal reflux disease), Hemorrhage of colon following colonoscopy (10/08/2019), History of kidney stones, History of MRSA infection, Hyperlipidemia, Hypertension, Impaired hearing, Left bundle branch block (LBBB) on electrocardiogram (10/29/2015), Macular degeneration (senile) of retina, Pre-diabetes, and Wears glasses.  He presents to the office today for a " bad cough". Has had a harsh semi productive cough x 8 days. When his cough is productive it consists of whitish yellow mucus. Does feel short of breath and tight in the chest. Has no noticed any wheezing.   He has a history of asthma and current bronchitis.  Has been using his inhaler without much improvement.   Review of Systems See HPI   Past Medical History:  Diagnosis Date   Anal fissure    Anxiety    Arthritis    right wrist; pt. states "everywhere"   Asthma    Bipolar disorder ([place])    Cataract    Depression    GERD (gastroesophageal reflux disease)    Hemorrhage of colon following colonoscopy 10/08/2019   History of kidney stones    History of MRSA infection    left knee after arthroplasty   Hyperlipidemia    Hypertension    states under control with med., has been on med. x 1 yr.   Impaired hearing    Left bundle branch block (LBBB) on electrocardiogram 10/29/2015   Macular degeneration (senile) of retina    left   Pre-diabetes    Wears glasses     Social History   Socioeconomic History   Marital status: Married    Spouse name: Not on file   Number of children: Not on file   Years of education: Not on file   Highest education level: Not on file  Occupational History   Not on file  Tobacco Use   Smoking status: Never    Smokeless tobacco: Never  Vaping Use   Vaping Use: Never used  Substance and Sexual Activity   Alcohol use: Yes    Comment: occasional.   Drug use: No   Sexual activity: Not Currently    Birth control/protection: None  Other Topics Concern   Not on file  Social History Narrative   Married, lives in [place].  Retired Dance movement psychotherapist.   Two daughters, both married,3 grandchildren ( 26, 37, 1 year)    No regular exercise.  Never smoker.  No ETOH.  No drugs.   Social Determinants of Health   Financial Resource Strain: Low Risk    Difficulty of Paying Living Expenses: Not hard at all  Food Insecurity: No Food Insecurity   Worried About Charity fundraiser in the Last Year: Never true   [place] in the Last Year: Never true  Transportation Needs: No Transportation Needs   Lack of Transportation (Medical): No   Lack of Transportation (Non-Medical): No  Physical Activity: Inactive   Days of Exercise per Week: 0 days   Minutes of Exercise per Session: 0 min  Stress: No Stress Concern Present   Feeling of Stress : Not at all  Social Connections: Moderately Isolated   Frequency of Communication with Friends and Family: Once a week   Frequency of Social Gatherings with Friends and Family: More than  three times a week   Attends Religious Services: Never   Active Member of Clubs or Organizations: No   Attends Archivist Meetings: Never   Marital Status: Married  Human resources officer Violence: Not At Risk   Fear of Current or Ex-Partner: No   Emotionally Abused: No   Physically Abused: No   Sexually Abused: No    Past Surgical History:  Procedure Laterality Date   ANKLE FUSION Bilateral    ANKLE SURGERY Bilateral    ligament surgery   BIOPSY  04/29/2020   Procedure: BIOPSY;  Surgeon: Lavena Bullion, DO;  Location: [facility];  Service: Gastroenterology;;   CARDIAC CATHETERIZATION  x 2   1983; 11/14/2003   CARPOMETACARPEL SUSPENSION  PLASTY Right 09/22/2016   Procedure: right thumb carpometacarpal  ARTHROPLASTY;  Surgeon: Leanora Cover, MD;  Location: [facility];  Service: Orthopedics;  Laterality: Right;  right thumb carpometacarpal  ARTHROPLASTY   CARPOMETACARPEL SUSPENSION PLASTY Right 11/02/2017   Procedure: RIGHT THUMB SUSPENSIONPLASTY WITH TIGHTROPE, DISTAL POLE SCAPHOID  EXCISION;  Surgeon: Leanora Cover, MD;  Location: [facility];  Service: Orthopedics;  Laterality: Right;   COLONOSCOPY WITH PROPOFOL N/A 10/02/2019   Procedure: COLONOSCOPY WITH PROPOFOL;  Surgeon: Lavena Bullion, DO;  Location: [facility];  Service: Gastroenterology;  Laterality: N/A;   COLONOSCOPY WITH PROPOFOL N/A 10/09/2019   Procedure: COLONOSCOPY WITH PROPOFOL;  Surgeon: Gatha Mayer, MD;  Location: [facility];  Service: Endoscopy;  Laterality: N/A;   COLONOSCOPY WITH PROPOFOL N/A 04/29/2020   Procedure: COLONOSCOPY WITH PROPOFOL;  Surgeon: Lavena Bullion, DO;  Location: [facility];  Service: Gastroenterology;  Laterality: N/A;   ELBOW SURGERY Left    ENDOSCOPIC MUCOSAL RESECTION N/A 10/02/2019   Procedure: ENDOSCOPIC MUCOSAL RESECTION;  Surgeon: Lavena Bullion, DO;  [facility]: [facility];  Service: Gastroenterology;  Laterality: N/A;   HEMOSTASIS CLIP PLACEMENT  10/09/2019   Procedure: HEMOSTASIS CLIP PLACEMENT;  Surgeon: Gatha Mayer, MD;  [facility];  Service: Endoscopy;;   INGUINAL HERNIA REPAIR Right    OPEN REDUCTION INTERNAL FIXATION (ORIF) DISTAL RADIAL FRACTURE Right 10/29/2015   Procedure: OPEN REDUCTION INTERNAL FIXATION (ORIF) DISTAL RADIAL FRACTURE;  Surgeon: Leanora Cover, MD;  Location: [facility];  Service: Orthopedics;  Laterality: Right;   ORIF ELBOW FRACTURE Right    POLYPECTOMY  04/29/2020   Procedure: POLYPECTOMY;  Surgeon: Lavena Bullion, DO;  [facility];  Service: Gastroenterology;;   SUBMUCOSAL LIFTING INJECTION  10/02/2019    Procedure: SUBMUCOSAL LIFTING INJECTION;  Surgeon: Lavena Bullion, DO;  [facility];  Service: Gastroenterology;;   TONSILLECTOMY     TOTAL KNEE ARTHROPLASTY Bilateral    TOTAL KNEE REVISION Right 08/25/2021   Procedure: Right knee polyethylene vs total knee arthroplasty revision;  Surgeon: Gaynelle Arabian, MD;  Location: [facility];  Service: Orthopedics;  Laterality: Right;   ULNAR COLLATERAL LIGAMENT REPAIR Right 12/29/2015   Procedure: REPAIR  RIGHT LATERAL ULNAR COLLATERAL LIGAMENT TEAR  EXTENSOR ORIGIN ;  Surgeon: Leanora Cover, MD;  Location: [facility];  Service: Orthopedics;  Laterality: Right;   WRIST ARTHROSCOPY WITH DEBRIDEMENT Right 07/14/2016   Procedure: RIGHT WRIST ARTHROSCOPY WITH DEBRIDEMENT  TRIANGULAR FIBROCARTILAGE COMPLEX;  Surgeon: Leanora Cover, MD;  Location: [place];  Service: Orthopedics;  Laterality: Right;    Family History  Problem Relation Age of Onset   Alcohol abuse Mother    Ovarian cancer Mother    Alcohol abuse Father  Pancreatic cancer Father    Hyperlipidemia Brother    Barrett's esophagus Brother    Colon cancer Maternal Grandmother    Depression Daughter    Paranoid behavior Daughter    Esophageal cancer Neg Hx    Stomach cancer Neg Hx    Rectal cancer Neg Hx    Colon polyps Neg Hx     Allergies  Allergen Reactions   Penicillins Other (See Comments)    BLISTERS Did it involve swelling of the face/tongue/throat, SOB, or low BP? No Did it involve sudden or severe rash/hives, skin peeling, or any reaction on the inside of your mouth or nose? No Did you need to seek medical attention at a hospital or doctor's office? No When did it last happen? around 1980    If all above answers are "NO", may proceed with cephalosporin use. Tolerated Cephalosporin Date: 08/26/21.     Lisinopril Diarrhea and Cough    Dry cough, abdominal bloating and diarrhea   Codeine Rash    Current  Outpatient Medications on File Prior to Visit  Medication Sig Dispense Refill   albuterol (VENTOLIN HFA) 108 (90 Base) MCG/ACT inhaler Inhale 2 puffs into the lungs every 6 (six) hours as needed for wheezing or shortness of breath. 6.7 g 3   ARIPiprazole ER (ABILIFY MAINTENA) 400 MG PRSY prefilled syringe Inject 400 mg into the muscle every 28 (twenty-eight) days. 1 each 2   aspirin 325 MG tablet aspirin 325 mg tablet   325 mg by oral route.     aspirin EC 325 MG EC tablet Take 1 tablet (325 mg total) by mouth 2 (two) times daily for 20 days. Then take one 81 mg aspirin once a day for three weeks. Then discontinue aspirin. 40 tablet 0   benztropine (COGENTIN) 0.5 MG tablet Take 1 tablet (0.5 mg total) by mouth daily. 90 tablet 0   cetirizine (ZYRTEC) 10 MG tablet Take 10 mg by mouth daily.     citalopram (CELEXA) 10 MG tablet Take 20 mg by mouth daily.     citalopram (CELEXA) 20 MG tablet Take 1 tablet (20 mg total) by mouth daily. 90 tablet 0   ezetimibe (ZETIA) 10 MG tablet TAKE ONE TABLET BY MOUTH AT NOON 90 tablet 3   fenofibrate (TRICOR) 145 MG tablet Take 1 tablet (145 mg total) by mouth daily. 90 tablet 3   losartan (COZAAR) 50 MG tablet TAKE ONE TABLET BY MOUTH EVERY MORNING 90 tablet 1   meclizine (ANTIVERT) 25 MG tablet Take 1 tablet (25 mg total) by mouth 3 (three) times daily as needed for dizziness. 30 tablet 0   methocarbamol (ROBAXIN) 500 MG tablet Take 1 tablet (500 mg total) by mouth every 6 (six) hours as needed for muscle spasms. 40 tablet 0   Multiple Vitamin (MULTIVITAMIN WITH MINERALS) TABS tablet Take 1 tablet by mouth daily.     Multiple Vitamins-Minerals (PRESERVISION AREDS 2 PO) Take 1 capsule by mouth 2 (two) times daily.     Omega-3 Fatty Acids (FISH OIL) 1000 MG CAPS Take 1,000 mg by mouth in the morning and at bedtime.     oxyCODONE (OXY IR/ROXICODONE) 5 MG immediate release tablet Take 1-2 tablets (5-10 mg total) by mouth every 6 (six) hours as  needed for severe pain or moderate pain. 42 tablet 0   pantoprazole (PROTONIX) 40 MG tablet Take 1 tablet (40 mg total) by mouth 2 (two) times daily. 180 tablet 1   temazepam (RESTORIL) 30 MG capsule Take  1 capsule (30 mg total) by mouth at bedtime. 90 capsule 0   Vitamin D, Cholecalciferol, 1000 UNITS TABS Take 1,000 Units by mouth daily.      Current Facility-Administered Medications on File Prior to Visit  Medication Dose Route Frequency Provider Last Rate Last Admin   ARIPiprazole ER (ABILIFY MAINTENA) 400 MG prefilled syringe 400 mg  400 mg Intramuscular Q28 days Arfeen, Arlyce Harman, MD   400 mg at 08/19/21 1117    BP 140/80    Pulse 87    Temp 98.6 F (37 C) (Oral)    Ht 5\' 8"  (1.727 m)    Wt 273 lb (123.8 kg)    SpO2 95%    BMI 41.51 kg/m       Objective:   Physical Exam Vitals and nursing note reviewed.  Constitutional:      Appearance: Normal appearance.  Cardiovascular:     Rate and Rhythm: Normal rate and regular rhythm.     Pulses: Normal pulses.     Heart sounds: Normal heart sounds.  Pulmonary:     Effort: Pulmonary effort is normal.     Breath sounds: Decreased air movement present. Examination of the right-upper field reveals decreased breath sounds and wheezing. Examination of the left-upper field reveals decreased breath sounds and wheezing. Examination of the right-middle field reveals decreased breath sounds and wheezing. Examination of the left-middle field reveals decreased breath sounds and wheezing. Examination of the right-lower field reveals decreased breath sounds and wheezing. Examination of the left-lower field reveals decreased breath sounds and wheezing. Decreased breath sounds and wheezing present. No rhonchi or rales.  Skin:    General: Skin is warm and dry.  Neurological:     Mental Status: He is alert.      Assessment & Plan:  1. Moderate persistent asthma with exacerbation -Likely asthma exacerbation.  His symptoms resolved after DuoNeb treatment  in the office today.  We will send in Hycodan cough syrup and start him on nebulizer therapy.  Nebulizer sheen provided, we will send the bill to his insurance. - HYDROcodone bit-homatropine (HYCODAN) 5-1.5 MG/5ML syrup; Take 5 mLs by mouth every 8 (eight) hours as needed for cough.  Dispense: 120 mL; Refill: 0 - ipratropium-albuterol (DUONEB) 0.5-2.5 (3) MG/3ML SOLN; Take 3 mLs by nebulization every 6 (six) hours as needed.  Dispense: 360 mL; Refill: 0 - ipratropium-albuterol (DUONEB) 0.5-2.5 (3) MG/3ML nebulizer solution 3 mL   [name], NP

## 2021-09-03 NOTE — Patient Instructions (Signed)
There are no preventive care reminders to display for this patient.  Depression screen [facility] 2/9 09/25/2020 11/07/2018 09/21/2017  Decreased Interest 0 0 0  Down, Depressed, Hopeless 0 0 -  PHQ - 2 Score 0 0 0  Some recent data might be hidden

## 2021-09-06 DIAGNOSIS — M25661 Stiffness of right knee, not elsewhere classified: Secondary | ICD-10-CM | POA: Diagnosis not present

## 2021-09-06 DIAGNOSIS — M25561 Pain in right knee: Secondary | ICD-10-CM | POA: Diagnosis not present

## 2021-09-09 ENCOUNTER — Encounter: Payer: Self-pay | Admitting: Adult Health

## 2021-09-09 ENCOUNTER — Other Ambulatory Visit: Payer: Self-pay | Admitting: Adult Health

## 2021-09-09 MED ORDER — PREDNISONE 10 MG PO TABS: ORAL_TABLET | ORAL | 0 refills | Status: DC

## 2021-09-09 MED ORDER — DOXYCYCLINE HYCLATE 100 MG PO CAPS: 100.0000 mg | ORAL_CAPSULE | Freq: Two times a day (BID) | ORAL | 0 refills | Status: DC

## 2021-09-14 ENCOUNTER — Telehealth: Payer: Self-pay | Admitting: Adult Health

## 2021-09-14 NOTE — Telephone Encounter (Signed)
Left message for patient to call back and schedule Medicare Annual Wellness Visit (AWV) either virtually or in office. Left  my Herbie Drape number [phone]   Last AWV 09/25/20 ; please schedule at anytime with [facility] Nurse Health Advisor 1 or 2   This should be a 45 minute visit.

## 2021-09-15 DIAGNOSIS — M25661 Stiffness of right knee, not elsewhere classified: Secondary | ICD-10-CM | POA: Diagnosis not present

## 2021-09-15 DIAGNOSIS — M25561 Pain in right knee: Secondary | ICD-10-CM | POA: Diagnosis not present

## 2021-09-17 ENCOUNTER — Telehealth: Payer: Self-pay

## 2021-09-17 DIAGNOSIS — M25661 Stiffness of right knee, not elsewhere classified: Secondary | ICD-10-CM | POA: Diagnosis not present

## 2021-09-17 DIAGNOSIS — M25561 Pain in right knee: Secondary | ICD-10-CM | POA: Diagnosis not present

## 2021-09-17 NOTE — Telephone Encounter (Signed)
Transition Care Management Unsuccessful Follow-up Telephone Call  Date of discharge and from where:  08/26/2021  [facility]  Attempts:  1st Attempt  Reason for unsuccessful TCM follow-up call:  No answer/busy  Tomasa Rand, RN, BSN, CEN [place] Coordinator [phone]

## 2021-09-21 ENCOUNTER — Ambulatory Visit (HOSPITAL_BASED_OUTPATIENT_CLINIC_OR_DEPARTMENT_OTHER): Payer: PPO

## 2021-09-21 ENCOUNTER — Other Ambulatory Visit: Payer: Self-pay

## 2021-09-21 ENCOUNTER — Telehealth: Payer: Self-pay | Admitting: Gastroenterology

## 2021-09-21 ENCOUNTER — Encounter (HOSPITAL_COMMUNITY): Payer: Self-pay

## 2021-09-21 VITALS — BP 148/79 | HR 75 | Temp 98.2°F | Ht 69.0 in | Wt 266.2 lb

## 2021-09-21 DIAGNOSIS — F319 Bipolar disorder, unspecified: Secondary | ICD-10-CM

## 2021-09-21 NOTE — Telephone Encounter (Signed)
Called and spoke with patient. He has been scheduled for a virtual pre-visit on Wednesday, 10/27/21 at 8:30 am. Pt is aware that this is a telephone visit. Pt's colonoscopy is scheduled for Thursday, 11/11/21 at 9 am with Dr. Bryan Lemma. Pt is aware that he will need to arrive at [facility] by 7:30 am with a care partner. Pt verbalized understanding of all information and had no concerns at the end of the call.
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
## 2021-09-21 NOTE — Telephone Encounter (Signed)
Inbound call from patient stating he is ready to schedule colonoscopy.

## 2021-09-22 ENCOUNTER — Encounter: Payer: Self-pay | Admitting: Gastroenterology

## 2021-09-22 DIAGNOSIS — H43812 Vitreous degeneration, left eye: Secondary | ICD-10-CM | POA: Diagnosis not present

## 2021-09-23 ENCOUNTER — Telehealth: Payer: Self-pay

## 2021-09-23 ENCOUNTER — Ambulatory Visit (INDEPENDENT_AMBULATORY_CARE_PROVIDER_SITE_OTHER): Payer: PPO

## 2021-09-23 VITALS — BP 130/61 | HR 68 | Ht 68.0 in | Wt 273.0 lb

## 2021-09-23 DIAGNOSIS — Z0000 Encounter for general adult medical examination without abnormal findings: Secondary | ICD-10-CM

## 2021-09-23 NOTE — Patient Instructions (Addendum)
Mr. Sean Hill , Thank you for taking time to come for your Medicare Wellness Visit. I appreciate your ongoing commitment to your health goals. Please review the following plan we discussed and let me know if I can assist you in the future.   These are the goals we discussed:  Goals      Exercise 3x per week (30 min per time)     [place] (see longitudinal plan of care for additional care plan information)  Current Barriers:  Chronic Disease Management support, education, and care coordination needs related to Hypertension, Hyperlipidemia, GERD, and Bipolar disorder, Insomnia, glucose intolerance    Hypertension BP Readings from Last 3 Encounters:  04/17/20 (!) 161/83  03/20/20 130/75  02/21/20 (!) 146/74  Pharmacist Clinical Goal(s): Over the next 120 days, patient will work with PharmD and providers to maintain BP goal <130/80 Current regimen:  Lisinopril 20mg , 2 tablet once daily  Patient self care activities - Over the next 120 days, patient will: Check BP 1 to 2 times per week, document, and provide at future appointments Ensure daily salt intake < 2300 mg/day  Hyperlipidemia Lab Results  Component Value Date/Time   LDLDIRECT 72.0 12/25/2019 08:23 AM   Triglycerides  Date Value Ref Range Status  12/25/2019 265.0 (H) 0 - 149 mg/dL Final    Comment:    Normal:  <150 mg/dLBorderline High:  150 - 199 mg/dL  Pharmacist Clinical Goal(s): Over the next 120 days, patient will work with PharmD and providers to maintain LDL goal < 100 and achieve triglyceride goal: <150 Current regimen:  No medications currently.  Interventions: Reviewed formulary for ezetimibe. It is tier 4 medication, therefore, has $150 deductible. After paying deductible, copay: ~$25 for 3 month supply.  Recommend restarting fenofibrate and ezetimibe.  Patient self care activities - Over the next 120 days, patient will: Continue current medications as directed by provider.    Glucose intolerance Lab Results  Component Value Date/Time   HGBA1C 6.0 12/25/2019 08:23 AM   HGBA1C 6.1 11/07/2018 07:41 AM  Pharmacist Clinical Goal(s): Over the next 120 days, patient will work with PharmD and providers to maintain A1c goal <6.5% Current regimen:  No medications Interventions: Recommend:  Modifying lifestyle, including to participate in moderate physical activity (e.g., walking) at least 150 minutes per week.  Mediterranean eating plan with an emphasis on whole grains, legumes, nuts, fruits, and vegetables and minimal refined and processed foods.  Patient self care activities - Over the next 120 days, patient will: Continue working on keeping blood pressure and lipids under goal.   GERD Pharmacist Clinical Goal(s) Over the next 120 days, patient will work with PharmD and providers to reduce acid reflux Current regimen:  Pantoprazole 40mg , 1 tablet twice daily Interventions: Recommend: non-pharmacological interventions for acid reflux. Take measures to prevent acid reflux, such as avoiding spicy foods, avoiding caffeine, avoid laying down a few hours after eating, and raising the head of the bed Patient self care activities - Over the next 120 days, patient will: Continue current medications as directed by provider.   Insomnia Pharmacist Clinical Goal(s) Over the next 120 days, patient will work with PharmD and providers to improve sleep.  Current regimen:  Temazepam 30mg , 1 capsule at bedtime  Patient self care activities Patient will continue current medications as directed by provider.   Bipolar disorder Pharmacist Clinical Goal(s) Over the next 120 days, patient will work with PharmD and providers to maintain/ stabilize mood  symptoms.  Current regimen:  Aripiprazole ER 400mg , inject 400mg  every twenty-eight days  Citalopram 20mg , 1 tablet once daily Trazodone 50mg , 1 tablet at bedtime  Benztropine 1mg , 1 tablet once daily (for tardive dyskinesia  prevention) Patient self care activities Patient will continue current medications as directed by provider.   Medication management Pharmacist Clinical Goal(s): Over the next 120 days, patient will work with PharmD and providers to maintain optimal medication adherence Current pharmacy: [facility] Interventions Comprehensive medication review performed. Continue current medication management strategy Patient self care activities - Over the next 120 days, patient will: Take medications as prescribed Report any questions or concerns to PharmD and/or provider(s)  Please see past updates related to this goal by clicking on the "Past Updates" button in the selected goal          This is a list of the screening recommended for you and due dates:  Health Maintenance  Topic Date Due   Tetanus Vaccine  10/28/2025   Pneumonia Vaccine  Completed   Flu Shot  Completed   COVID-19 Vaccine  Completed   Hepatitis C Screening: USPSTF Recommendation to screen - Ages 69-79 yo.  Completed   Zoster (Shingles) Vaccine  Completed   HPV Vaccine  Aged Out    Advanced directives: Yes  Conditions/risks identified: None  Next appointment: Follow up in one year for your annual wellness visit.   Preventive Care 36 Years and Older, Male Preventive care refers to lifestyle choices and visits with your health care provider that can promote health and wellness. What does preventive care include? A yearly physical exam. This is also called an annual well check. Dental exams once or twice a year. Routine eye exams. Ask your health care provider how often you should have your eyes checked. Personal lifestyle choices, including: Daily care of your teeth and gums. Regular physical activity. Eating a healthy diet. Avoiding tobacco and drug use. Limiting alcohol use. Practicing safe sex. Taking low doses of aspirin every day. Taking vitamin and mineral supplements as recommended by your health care  provider. What happens during an annual well check? The services and screenings done by your health care provider during your annual well check will depend on your age, overall health, lifestyle risk factors, and family history of disease. Counseling  Your health care provider may ask you questions about your: Alcohol use. Tobacco use. Drug use. Emotional well-being. Home and relationship well-being. Sexual activity. Eating habits. History of falls. Memory and ability to understand (cognition). Work and work Statistician. Screening  You may have the following tests or measurements: Height, weight, and BMI. Blood pressure. Lipid and cholesterol levels. These may be checked every 5 years, or more frequently if you are over 23 years old. Skin check. Lung cancer screening. You may have this screening every year starting at age 49 if you have a 30-pack-year history of smoking and currently smoke or have quit within the past 15 years. Fecal occult blood test (FOBT) of the stool. You may have this test every year starting at age 33. Flexible sigmoidoscopy or colonoscopy. You may have a sigmoidoscopy every 5 years or a colonoscopy every 10 years starting at age 61. Prostate cancer screening. Recommendations will vary depending on your family history and other risks. Hepatitis C blood test. Hepatitis B blood test. Sexually transmitted disease (STD) testing. Diabetes screening. This is done by checking your blood sugar (glucose) after you have not eaten for a while (fasting). You may have this done every 1-3  years. Abdominal aortic aneurysm (AAA) screening. You may need this if you are a current or former smoker. Osteoporosis. You may be screened starting at age 27 if you are at high risk. Talk with your health care provider about your test results, treatment options, and if necessary, the need for more tests. Vaccines  Your health care provider may recommend certain vaccines, such  as: Influenza vaccine. This is recommended every year. Tetanus, diphtheria, and acellular pertussis (Tdap, Td) vaccine. You may need a Td booster every 10 years. Zoster vaccine. You may need this after age 66. Pneumococcal 13-valent conjugate (PCV13) vaccine. One dose is recommended after age 88. Pneumococcal polysaccharide (PPSV23) vaccine. One dose is recommended after age 15. Talk to your health care provider about which screenings and vaccines you need and how often you need them. This information is not intended to replace advice given to you by your health care provider. Make sure you discuss any questions you have with your health care provider. Document Released: 10/02/2015 Document Revised: 05/25/2016 Document Reviewed: 07/07/2015 Elsevier Interactive Patient Education  2017 [employer] Prevention in the Home Falls can cause injuries. They can happen to people of all ages. There are many things you can do to make your home safe and to help prevent falls. What can I do on the outside of my home? Regularly fix the edges of walkways and driveways and fix any cracks. Remove anything that might make you trip as you walk through a door, such as a raised step or threshold. Trim any bushes or trees on the path to your home. Use bright outdoor lighting. Clear any walking paths of anything that might make someone trip, such as rocks or tools. Regularly check to see if handrails are loose or broken. Make sure that both sides of any steps have handrails. Any raised decks and porches should have guardrails on the edges. Have any leaves, snow, or ice cleared regularly. Use sand or salt on walking paths during winter. Clean up any spills in your garage right away. This includes oil or grease spills. What can I do in the bathroom? Use night lights. Install grab bars by the toilet and in the tub and shower. Do not use towel bars as grab bars. Use non-skid mats or decals in the tub or  shower. If you need to sit down in the shower, use a plastic, non-slip stool. Keep the floor dry. Clean up any water that spills on the floor as soon as it happens. Remove soap buildup in the tub or shower regularly. Attach bath mats securely with double-sided non-slip rug tape. Do not have throw rugs and other things on the floor that can make you trip. What can I do in the bedroom? Use night lights. Make sure that you have a light by your bed that is easy to reach. Do not use any sheets or blankets that are too big for your bed. They should not hang down onto the floor. Have a firm chair that has side arms. You can use this for support while you get dressed. Do not have throw rugs and other things on the floor that can make you trip. What can I do in the kitchen? Clean up any spills right away. Avoid walking on wet floors. Keep items that you use a lot in easy-to-reach places. If you need to reach something above you, use a strong step stool that has a grab bar. Keep electrical cords out of the way. Do  not use floor polish or wax that makes floors slippery. If you must use wax, use non-skid floor wax. Do not have throw rugs and other things on the floor that can make you trip. What can I do with my stairs? Do not leave any items on the stairs. Make sure that there are handrails on both sides of the stairs and use them. Fix handrails that are broken or loose. Make sure that handrails are as long as the stairways. Check any carpeting to make sure that it is firmly attached to the stairs. Fix any carpet that is loose or worn. Avoid having throw rugs at the top or bottom of the stairs. If you do have throw rugs, attach them to the floor with carpet tape. Make sure that you have a light switch at the top of the stairs and the bottom of the stairs. If you do not have them, ask someone to add them for you. What else can I do to help prevent falls? Wear shoes that: Do not have high heels. Have  rubber bottoms. Are comfortable and fit you well. Are closed at the toe. Do not wear sandals. If you use a stepladder: Make sure that it is fully opened. Do not climb a closed stepladder. Make sure that both sides of the stepladder are locked into place. Ask someone to hold it for you, if possible. Clearly mark and make sure that you can see: Any grab bars or handrails. First and last steps. Where the edge of each step is. Use tools that help you move around (mobility aids) if they are needed. These include: Canes. Walkers. Scooters. Crutches. Turn on the lights when you go into a dark area. Replace any light bulbs as soon as they burn out. Set up your furniture so you have a clear path. Avoid moving your furniture around. If any of your floors are uneven, fix them. If there are any pets around you, be aware of where they are. Review your medicines with your doctor. Some medicines can make you feel dizzy. This can increase your chance of falling. Ask your doctor what other things that you can do to help prevent falls. This information is not intended to replace advice given to you by your health care provider. Make sure you discuss any questions you have with your health care provider. Document Released: 07/02/2009 Document Revised: 02/11/2016 Document Reviewed: 10/10/2014 Elsevier Interactive Patient Education  2017 [employer].

## 2021-09-23 NOTE — Telephone Encounter (Signed)
Transition Care Management Unsuccessful Follow-up Telephone Call  Date of discharge and from where:  08/26/2021 [facility]  Attempts:  2nd Attempt  Reason for unsuccessful TCM follow-up call:  No answer/busy Tomasa Rand, RN, BSN, CEN [place] Coordinator [phone]

## 2021-09-23 NOTE — Progress Notes (Signed)
Subjective:   Sean Hill is a 81 y.o. male who presents for Medicare Annual/Subsequent preventive examination.  Review of Systems    No ROS Cardiac Risk Factors include: advanced age (>51men, >80 women);hypertension    Objective:    Today's Vitals   09/23/21 1349  BP: 130/61  Pulse: 68  SpO2: 98%  Weight: 273 lb (123.8 kg)  Height: 5\' 8"  (1.727 m)   Body mass index is 41.51 kg/m.  Advanced Directives 09/23/2021 08/25/2021 08/25/2021 08/16/2021 09/25/2020 08/07/2020 04/29/2020  Does Patient Have a Medical Advance Directive? Yes No No Yes Yes Yes Yes  Type of Paramedic of [place];Living will Living will;Healthcare Power of Attorney - Living will;Healthcare Power of [place];Living will [place];Living will [place];Living will  Does patient want to make changes to medical advance directive? No - Patient declined - - - No - Patient declined - -  Copy of [place] in Chart? No - copy requested No - copy requested - - No - copy requested - No - copy requested  Would patient like information on creating a medical advance directive? - No - Patient declined No - Patient declined - - - -  Pre-existing out of facility DNR order (yellow form or pink MOST form) - - - - - - -  Some encounter information is confidential and restricted. Go to Review Flowsheets activity to see all data.    Current Medications (verified) Outpatient Encounter Medications as of 09/23/2021  Medication Sig   albuterol (VENTOLIN HFA) 108 (90 Base) MCG/ACT inhaler Inhale 2 puffs into the lungs every 6 (six) hours as needed for wheezing or shortness of breath.   ARIPiprazole ER (ABILIFY MAINTENA) 400 MG PRSY prefilled syringe Inject 400 mg into the muscle every 28 (twenty-eight) days.   benztropine (COGENTIN) 0.5 MG tablet Take 1 tablet (0.5 mg total) by mouth daily.   cetirizine (ZYRTEC) 10 MG tablet Take  10 mg by mouth daily.   citalopram (CELEXA) 10 MG tablet Take 20 mg by mouth daily.   citalopram (CELEXA) 20 MG tablet Take 1 tablet (20 mg total) by mouth daily.   ezetimibe (ZETIA) 10 MG tablet TAKE ONE TABLET BY MOUTH AT NOON   fenofibrate (TRICOR) 145 MG tablet Take 1 tablet (145 mg total) by mouth daily.   ipratropium-albuterol (DUONEB) 0.5-2.5 (3) MG/3ML SOLN Take 3 mLs by nebulization every 6 (six) hours as needed.   losartan (COZAAR) 50 MG tablet TAKE ONE TABLET BY MOUTH EVERY MORNING   Multiple Vitamin (MULTIVITAMIN WITH MINERALS) TABS tablet Take 1 tablet by mouth daily.   Multiple Vitamins-Minerals (PRESERVISION AREDS 2 PO) Take 1 capsule by mouth 2 (two) times daily.   Omega-3 Fatty Acids (FISH OIL) 1000 MG CAPS Take 1,000 mg by mouth in the morning and at bedtime.   pantoprazole (PROTONIX) 40 MG tablet Take 1 tablet (40 mg total) by mouth 2 (two) times daily.   temazepam (RESTORIL) 30 MG capsule Take 1 capsule (30 mg total) by mouth at bedtime.   Vitamin D, Cholecalciferol, 1000 UNITS TABS Take 1,000 Units by mouth daily.    [DISCONTINUED] doxycycline (VIBRAMYCIN) 100 MG capsule Take 1 capsule (100 mg total) by mouth 2 (two) times daily.   [DISCONTINUED] HYDROcodone bit-homatropine (HYCODAN) 5-1.5 MG/5ML syrup Take 5 mLs by mouth every 8 (eight) hours as needed for cough.   [DISCONTINUED] meclizine (ANTIVERT) 25 MG tablet Take 1 tablet (25 mg total) by  mouth 3 (three) times daily as needed for dizziness.   [DISCONTINUED] methocarbamol (ROBAXIN) 500 MG tablet Take 1 tablet (500 mg total) by mouth every 6 (six) hours as needed for muscle spasms.   [DISCONTINUED] oxyCODONE (OXY IR/ROXICODONE) 5 MG immediate release tablet Take 1-2 tablets (5-10 mg total) by mouth every 6 (six) hours as needed for severe pain or moderate pain.   [DISCONTINUED] predniSONE (DELTASONE) 10 MG tablet 40 mg x 3 days, 20 mg x 3 days, 10 mg x 3 days   Facility-Administered Encounter Medications as of 09/23/2021   Medication   ARIPiprazole ER (ABILIFY MAINTENA) 400 MG prefilled syringe 400 mg    Allergies (verified) Penicillins, Lisinopril, and Codeine   History: Past Medical History:  Diagnosis Date   Anal fissure    Anxiety    Arthritis    right wrist; pt. states "everywhere"   Asthma    Bipolar disorder ([place])    Cataract    Depression    GERD (gastroesophageal reflux disease)    Hemorrhage of colon following colonoscopy 10/08/2019   History of kidney stones    History of MRSA infection    left knee after arthroplasty   Hyperlipidemia    Hypertension    states under control with med., has been on med. x 1 yr.   Impaired hearing    Left bundle branch block (LBBB) on electrocardiogram 10/29/2015   Macular degeneration (senile) of retina    left   Pre-diabetes    Wears glasses    Past Surgical History:  Procedure Laterality Date   ANKLE FUSION Bilateral    ANKLE SURGERY Bilateral    ligament surgery   BIOPSY  04/29/2020   Procedure: BIOPSY;  Surgeon: Lavena Bullion, DO;  Location: [facility];  Service: Gastroenterology;;   CARDIAC CATHETERIZATION  x 2   1983; 11/14/2003   CARPOMETACARPEL SUSPENSION PLASTY Right 09/22/2016   Procedure: right thumb carpometacarpal  ARTHROPLASTY;  Surgeon: Leanora Cover, MD;  Location: [facility];  Service: Orthopedics;  Laterality: Right;  right thumb carpometacarpal  ARTHROPLASTY   CARPOMETACARPEL SUSPENSION PLASTY Right 11/02/2017   Procedure: RIGHT THUMB SUSPENSIONPLASTY WITH TIGHTROPE, DISTAL POLE SCAPHOID  EXCISION;  Surgeon: Leanora Cover, MD;  Location: [place];  Service: Orthopedics;  Laterality: Right;   COLONOSCOPY WITH PROPOFOL N/A 10/02/2019   Procedure: COLONOSCOPY WITH PROPOFOL;  Surgeon: Lavena Bullion, DO;  [facility];  Service: Gastroenterology;  Laterality: N/A;   COLONOSCOPY WITH PROPOFOL N/A 10/09/2019   Procedure: COLONOSCOPY WITH PROPOFOL;  Surgeon: Gatha Mayer, MD;   [facility];  Service: Endoscopy;  Laterality: N/A;   COLONOSCOPY WITH PROPOFOL N/A 04/29/2020   Procedure: COLONOSCOPY WITH PROPOFOL;  Surgeon: Lavena Bullion, DO;  [facility];  Service: Gastroenterology;  Laterality: N/A;   ELBOW SURGERY Left    ENDOSCOPIC MUCOSAL RESECTION N/A 10/02/2019   Procedure: ENDOSCOPIC MUCOSAL RESECTION;  Surgeon: Lavena Bullion, DO;  [facility];  Service: Gastroenterology;  Laterality: N/A;   HEMOSTASIS CLIP PLACEMENT  10/09/2019   Procedure: HEMOSTASIS CLIP PLACEMENT;  Surgeon: Gatha Mayer, MD;  [facility];  Service: Endoscopy;;   INGUINAL HERNIA REPAIR Right    KNEE SURGERY     OPEN REDUCTION INTERNAL FIXATION (ORIF) DISTAL RADIAL FRACTURE Right 10/29/2015   Procedure: OPEN REDUCTION INTERNAL FIXATION (ORIF) DISTAL RADIAL FRACTURE;  Surgeon: Leanora Cover, MD;  Location: [place];  Service: Orthopedics;  Laterality: Right;   ORIF ELBOW FRACTURE Right  POLYPECTOMY  04/29/2020   Procedure: POLYPECTOMY;  Surgeon: Lavena Bullion, DO;  [facility];  Service: Gastroenterology;;   SUBMUCOSAL LIFTING INJECTION  10/02/2019   Procedure: SUBMUCOSAL LIFTING INJECTION;  Surgeon: Lavena Bullion, DO;  [facility];  Service: Gastroenterology;;   TONSILLECTOMY     TOTAL KNEE ARTHROPLASTY Bilateral    TOTAL KNEE REVISION Right 08/25/2021   Procedure: Right knee polyethylene vs total knee arthroplasty revision;  Surgeon: Gaynelle Arabian, MD;  [facility];  Service: Orthopedics;  Laterality: Right;   ULNAR COLLATERAL LIGAMENT REPAIR Right 12/29/2015   Procedure: REPAIR  RIGHT LATERAL ULNAR COLLATERAL LIGAMENT TEAR  EXTENSOR ORIGIN ;  Surgeon: Leanora Cover, MD;  Location: [place];  Service: Orthopedics;  Laterality: Right;   WRIST ARTHROSCOPY WITH DEBRIDEMENT Right 07/14/2016   Procedure: RIGHT WRIST ARTHROSCOPY WITH DEBRIDEMENT  TRIANGULAR FIBROCARTILAGE  COMPLEX;  Surgeon: Leanora Cover, MD;  Location: [place];  Service: Orthopedics;  Laterality: Right;   Family History  Problem Relation Age of Onset   Alcohol abuse Mother    Ovarian cancer Mother    Alcohol abuse Father    Pancreatic cancer Father    Hyperlipidemia Brother    Barrett's esophagus Brother    Colon cancer Maternal Grandmother    Depression Daughter    Paranoid behavior Daughter    Esophageal cancer Neg Hx    Stomach cancer Neg Hx    Rectal cancer Neg Hx    Colon polyps Neg Hx    Social History   Socioeconomic History   Marital status: Married    Spouse name: Not on file   Number of children: Not on file   Years of education: Not on file   Highest education level: Not on file  Occupational History   Not on file  Tobacco Use   Smoking status: Never   Smokeless tobacco: Never  Vaping Use   Vaping Use: Never used  Substance and Sexual Activity   Alcohol use: Not Currently    Comment: occasional.   Drug use: No   Sexual activity: Not Currently    Birth control/protection: None  Other Topics Concern   Not on file  Social History Narrative   Married, lives in [place].  Retired Dance movement psychotherapist.   Two daughters, both married,3 grandchildren ( 34, 51, 1 year)    No regular exercise.  Never smoker.  No ETOH.  No drugs.   Social Determinants of Health   Financial Resource Strain: Low Risk    Difficulty of Paying Living Expenses: Not hard at all  Food Insecurity: No Food Insecurity   Worried About Charity fundraiser in the Last Year: Never true   [place] in the Last Year: Never true  Transportation Needs: No Transportation Needs   Lack of Transportation (Medical): No   Lack of Transportation (Non-Medical): No  Physical Activity: Inactive   Days of Exercise per Week: 0 days   Minutes of Exercise per Session: 0 min  Stress: No Stress Concern Present   Feeling of Stress : Not at all  Social Connections: Moderately Isolated    Frequency of Communication with Friends and Family: More than three times a week   Frequency of Social Gatherings with Friends and Family: More than three times a week   Attends Religious Services: Never   Marine scientist or Organizations: No   Attends Archivist Meetings: Never   Marital Status: Married  Clinical Intake:  How often do you need to have someone help you when you read instructions, pamphlets, or other written materials from your doctor or pharmacy?: 1 - Never  Diabetic? No  Interpreter Needed?: NoActivities of Daily Living In your present state of health, do you have any difficulty performing the following activities: 09/23/2021 08/25/2021  Hearing? N N  Comment - -  Vision? N N  Difficulty concentrating or making decisions? N N  Comment - -  Walking or climbing stairs? N N  Comment - -  Dressing or bathing? N N  Doing errands, shopping? N N  Preparing Food and eating ? N -  Using the Toilet? N -  In the past six months, have you accidently leaked urine? N -  Do you have problems with loss of bowel control? N -  Managing your Medications? N -  Managing your Finances? N -  Housekeeping or managing your Housekeeping? N -  Some recent data might be hidden    Patient Care Team: [name], NP as PCP - General (Family Medicine) Leanora Cover, MD as Consulting Physician (Orthopedic Surgery) Viona Gilmore, [facility] as Pharmacist (Pharmacist)  Indicate any recent Medical Services you may have received from other than [facility] providers in the past year (date may be approximate).     Assessment:   This is a routine wellness examination for Sean Hill.  Virtual Visit via Telephone Note  I connected with  Sean Hill on 09/23/21 at  1:45 PM EST by telephone and verified that I am speaking with the correct person using two identifiers.  Location: Patient: Home Provider: Office Persons participating in the virtual visit: patient/Nurse Health  Advisor   I discussed the limitations, risks, security and privacy concerns of performing an evaluation and management service by telephone and the availability of in person appointments. The patient expressed understanding and agreed to proceed.  Interactive audio and video telecommunications were attempted between this nurse and patient, however failed, due to patient having technical difficulties OR patient did not have access to video capability.  We continued and completed visit with audio only.  Some vital signs may be absent or patient reported.   [name], LPN   Hearing/Vision screen Hearing Screening - Comments:: Wears hearing aids Vision Screening - Comments:: Wears glasses. Followed by Dr Sharen Counter  Dietary issues and exercise activities discussed: Current Exercise Habits: The patient does not participate in regular exercise at present   Goals Addressed   None    Depression Screen PHQ 2/9 Scores 09/23/2021 09/25/2020 11/07/2018 09/21/2017 01/02/2015 12/02/2013  PHQ - 2 Score 0 0 0 0 0 4  Some encounter information is confidential and restricted. Go to Review Flowsheets activity to see all data.    Fall Risk Fall Risk  09/23/2021 09/25/2020 11/07/2018 09/21/2017 01/02/2015  Falls in the past year? 0 0 0 No Yes  Number falls in past yr: 0 0 - - 1  Injury with Fall? 1 0 - - Yes  Comment Patient factured left arm - - - -  Risk for fall due to : - History of fall(s) - - -  Follow up - Falls evaluation completed - - -    FALL RISK PREVENTION PERTAINING TO THE HOME:  Any stairs in or around the home? Yes  If so, are there any without handrails? No  Home free of loose throw rugs in walkways, pet beds, electrical cords, etc? Yes  Adequate lighting in your home to reduce risk  of falls? Yes   ASSISTIVE DEVICES UTILIZED TO PREVENT FALLS:  Life alert? No  Use of a cane, walker or w/c? No  Grab bars in the bathroom? Yes  Shower chair or bench in shower? No  Elevated toilet seat or a  handicapped toilet? No   TIMED UP AND GO:  Was the test performed? No . Audio Visit  Cognitive Function   6CIT Screen 09/23/2021  What Year? 0 points  What month? 0 points  What time? 0 points  Count back from 20 0 points  Months in reverse 0 points  Repeat phrase 0 points  Total Score 0    Immunizations Immunization History  Administered Date(s) Administered   Fluad Quad(high Dose 65+) 05/29/2019, 06/04/2021   Influenza Inj Mdck Quad Pf 05/29/2019   Influenza, High Dose Seasonal PF 06/07/2016, 05/15/2017   Influenza-Unspecified 07/20/2013, 06/16/2020   PFIZER Comirnaty(Gray Top)Covid-19 Tri-Sucrose Vaccine 01/29/2021   PFIZER(Purple Top)SARS-COV-2 Vaccination 10/25/2019, 11/19/2019, 06/16/2020   Pfizer Covid-19 Vaccine Bivalent Booster 81yrs & up 06/09/2021   Pneumococcal Conjugate-13 03/11/2015   Pneumococcal Polysaccharide-23 07/06/2010   Tdap 07/06/2010, 10/29/2015   Zoster Recombinat (Shingrix) 01/29/2021, 05/18/2021     Screening Tests Health Maintenance  Topic Date Due   TETANUS/TDAP  10/28/2025   Pneumonia Vaccine 81+ Years old  Completed   INFLUENZA VACCINE  Completed   COVID-19 Vaccine  Completed   Hepatitis C Screening  Completed   Zoster Vaccines- Shingrix  Completed   HPV VACCINES  Aged Out    Health Maintenance  There are no preventive care reminders to display for this patient.   Additional Screening:   Vision Screening: Recommended annual ophthalmology exams for early detection of glaucoma and other disorders of the eye. Is the patient up to date with their annual eye exam?  Yes  Who is the provider or what is the name of the office in which the patient attends annual eye exams? Followed by Dr Sharen Counter.    Dental Screening: Recommended annual dental exams for proper oral hygiene  Community Resource Referral / Chronic Care Management:  CRR required this visit?  No   CCM required this visit?  No      Plan:     I have personally  reviewed and noted the following in the patients chart:   Medical and social history Use of alcohol, tobacco or illicit drugs  Current medications and supplements including opioid prescriptions. Patient is not currently taking opioid prescriptions. Functional ability and status Nutritional status Physical activity Advanced directives List of other physicians Hospitalizations, surgeries, and ER visits in previous 12 months Vitals Screenings to include cognitive, depression, and falls Referrals and appointments  In addition, I have reviewed and discussed with patient certain preventive protocols, quality metrics, and best practice recommendations. A written personalized care plan for preventive services as well as general preventive health recommendations were provided to patient.     [name], LPN   0/09/930

## 2021-09-28 DIAGNOSIS — Z4789 Encounter for other orthopedic aftercare: Secondary | ICD-10-CM | POA: Diagnosis not present

## 2021-10-05 ENCOUNTER — Encounter: Payer: Self-pay | Admitting: *Deleted

## 2021-10-05 DIAGNOSIS — F411 Generalized anxiety disorder: Secondary | ICD-10-CM | POA: Diagnosis not present

## 2021-10-20 ENCOUNTER — Encounter (HOSPITAL_COMMUNITY): Payer: Self-pay | Admitting: *Deleted

## 2021-10-20 ENCOUNTER — Ambulatory Visit (HOSPITAL_BASED_OUTPATIENT_CLINIC_OR_DEPARTMENT_OTHER): Payer: PPO | Admitting: *Deleted

## 2021-10-20 ENCOUNTER — Other Ambulatory Visit: Payer: Self-pay

## 2021-10-20 VITALS — BP 151/73 | HR 65 | Temp 98.3°F | Resp 20 | Ht 65.0 in | Wt 263.4 lb

## 2021-10-20 DIAGNOSIS — F319 Bipolar disorder, unspecified: Secondary | ICD-10-CM

## 2021-10-20 NOTE — Patient Instructions (Signed)
Pt in office today for due Abilify Maintena 400 mg injection. Pt pleasant, appropriate and cooperative on approach. Injection prepared and given as ordered in right upper outer quadrant without complaints. Pt to return in approximately 28 days for next due injection. Pt encouraged to call office/this nurse if needed prior to next visit.

## 2021-10-25 ENCOUNTER — Other Ambulatory Visit: Payer: Self-pay | Admitting: Adult Health

## 2021-10-25 ENCOUNTER — Other Ambulatory Visit: Payer: Self-pay | Admitting: Gastroenterology

## 2021-10-25 ENCOUNTER — Telehealth: Payer: Self-pay | Admitting: Adult Health

## 2021-10-25 DIAGNOSIS — I10 Essential (primary) hypertension: Secondary | ICD-10-CM

## 2021-10-25 NOTE — Telephone Encounter (Signed)
Patient calling in with respiratory symptoms: Shortness of breath, chest pain, palpitations or other red words send to Triage  Does the patient have a fever over 100, cough, congestion, sore throat, runny nose, lost of taste/smell (please list symptoms that patient has)?cough  What date did symptoms start?10-20-2021 (If over 5 days ago, pt may be scheduled for in person visit)  Have you tested for Covid in the last 5 days? No   If yes, was it positive []  OR negative [] ? If positive in the last 5 days, please schedule virtual visit now. If negative, schedule for an in person OV with the next available provider if PCP has no openings. Please also let patient know they will be tested again (follow the script below)  "you will have to arrive 42mins prior to your appt time to be Covid tested. Please park in back of [facility] at the [facility] & call [phone] to let the staff know you have arrived. A staff member will meet you at your car to do a rapid covid test. Once the test has resulted you will be notified by phone of your results to determine if appt will remain an in person visit or be converted to a virtual/phone visit. If you arrive less than 32mins before your appt time, your visit will be automatically converted to virtual & any recommended testing will happen AFTER the visit." Pt has an appt with cory on 10-26-2021 930 am  THINGS TO REMEMBER  If no availability for virtual visit in office,  please schedule another [place] office  If no availability at another [place] office, please instruct patient that they can schedule an evisit or virtual visit through their mychart account. Visits up to 8pm  patients can be seen in office 5 days after positive COVID test

## 2021-10-26 ENCOUNTER — Encounter: Payer: Self-pay | Admitting: Adult Health

## 2021-10-26 ENCOUNTER — Ambulatory Visit (INDEPENDENT_AMBULATORY_CARE_PROVIDER_SITE_OTHER): Payer: PPO | Admitting: Adult Health

## 2021-10-26 VITALS — BP 110/70 | HR 74 | Temp 98.8°F | Ht 65.0 in | Wt 262.0 lb

## 2021-10-26 DIAGNOSIS — J0140 Acute pansinusitis, unspecified: Secondary | ICD-10-CM | POA: Diagnosis not present

## 2021-10-26 DIAGNOSIS — N401 Enlarged prostate with lower urinary tract symptoms: Secondary | ICD-10-CM | POA: Diagnosis not present

## 2021-10-26 DIAGNOSIS — R351 Nocturia: Secondary | ICD-10-CM | POA: Diagnosis not present

## 2021-10-26 MED ORDER — DOXYCYCLINE HYCLATE 100 MG PO CAPS: 100.0000 mg | ORAL_CAPSULE | Freq: Two times a day (BID) | ORAL | 0 refills | Status: DC

## 2021-10-26 MED ORDER — TAMSULOSIN HCL 0.4 MG PO CAPS: 0.4000 mg | ORAL_CAPSULE | Freq: Every day | ORAL | 1 refills | Status: DC

## 2021-10-26 NOTE — Addendum Note (Signed)
Addended by: Apolinar Junes on: 10/26/2021 02:33 PM   Modules accepted: Level of Service

## 2021-10-26 NOTE — Progress Notes (Addendum)
Subjective:    Patient ID: Sean Hill, male    DOB: 1941/07/12, 81 y.o.   MRN: [id number]  HPI 81 year old male who  has a past medical history of Anal fissure, Anxiety, Arthritis, Asthma, Bipolar disorder ([place]), Cataract, Depression, Difficult airway for intubation, GERD (gastroesophageal reflux disease), Hemorrhage of colon following colonoscopy (10/08/2019), History of kidney stones, History of MRSA infection, Hyperlipidemia, Hypertension, Impaired hearing, Left bundle branch block (LBBB) on electrocardiogram (10/29/2015), Macular degeneration (senile) of retina, Pre-diabetes, and Wears glasses.  Presents to the office today for concern of sinusitis.  Reports over the last 2 weeks he has had sinus pain and pressure, discolored rhinorrhea, and a productive cough.  Denies fevers or chills.  Does not feel short of breath or have any wheezing.  Additionally, he reports an issue that is been present for an unknown amount of time of nocturia.  Reports getting up 3-4 times at night to urinate.  Denies stream decreased but does have incomplete bladder emptying.  It is enough of a bother that he is interested in starting medication Lab Results  Component Value Date   PSA 0.28 04/29/2021   PSA 0.22 12/25/2019   PSA 0.21 11/07/2018       Review of Systems See HPI   Past Medical History:  Diagnosis Date   Anal fissure    Anxiety    Arthritis    right wrist; pt. states "everywhere"   Asthma    Bipolar disorder ([place])    Cataract    Depression    Difficult airway for intubation    GERD (gastroesophageal reflux disease)    Hemorrhage of colon following colonoscopy 10/08/2019   History of kidney stones    History of MRSA infection    left knee after arthroplasty   Hyperlipidemia    Hypertension    states under control with med., has been on med. x 1 yr.   Impaired hearing    Left bundle branch block (LBBB) on electrocardiogram 10/29/2015   Macular degeneration (senile) of  retina    left   Pre-diabetes    Wears glasses     Social History   Socioeconomic History   Marital status: Married    Spouse name: Not on file   Number of children: Not on file   Years of education: Not on file   Highest education level: Not on file  Occupational History   Not on file  Tobacco Use   Smoking status: Never   Smokeless tobacco: Never  Vaping Use   Vaping Use: Never used  Substance and Sexual Activity   Alcohol use: Not Currently    Comment: occasional.   Drug use: No   Sexual activity: Not Currently    Birth control/protection: None  Other Topics Concern   Not on file  Social History Narrative   Married, lives in [place].  Retired Dance movement psychotherapist.   Two daughters, both married,3 grandchildren ( 31, 67, 1 year)    No regular exercise.  Never smoker.  No ETOH.  No drugs.   Social Determinants of Health   Financial Resource Strain: Low Risk    Difficulty of Paying Living Expenses: Not hard at all  Food Insecurity: No Food Insecurity   Worried About Charity fundraiser in the Last Year: Never true   [place] in the Last Year: Never true  Transportation Needs: No Transportation Needs   Lack of Transportation (Medical): No   Lack of Transportation (Non-Medical):  No  Physical Activity: Inactive   Days of Exercise per Week: 0 days   Minutes of Exercise per Session: 0 min  Stress: No Stress Concern Present   Feeling of Stress : Not at all  Social Connections: Moderately Isolated   Frequency of Communication with Friends and Family: More than three times a week   Frequency of Social Gatherings with Friends and Family: More than three times a week   Attends Religious Services: Never   Marine scientist or Organizations: No   Attends Archivist Meetings: Never   Marital Status: Married  Human resources officer Violence: Not At Risk   Fear of Current or Ex-Partner: No   Emotionally Abused: No   Physically Abused: No   Sexually Abused: No     Past Surgical History:  Procedure Laterality Date   ANKLE FUSION Bilateral    ANKLE SURGERY Bilateral    ligament surgery   BIOPSY  04/29/2020   Procedure: BIOPSY;  Surgeon: Lavena Bullion, DO;  Location: [facility];  Service: Gastroenterology;;   CARDIAC CATHETERIZATION  x 2   1983; 11/14/2003   CARPOMETACARPEL SUSPENSION PLASTY Right 09/22/2016   Procedure: right thumb carpometacarpal  ARTHROPLASTY;  Surgeon: Leanora Cover, MD;  Location: [facility];  Service: Orthopedics;  Laterality: Right;  right thumb carpometacarpal  ARTHROPLASTY   CARPOMETACARPEL SUSPENSION PLASTY Right 11/02/2017   Procedure: RIGHT THUMB SUSPENSIONPLASTY WITH TIGHTROPE, DISTAL POLE SCAPHOID  EXCISION;  Surgeon: Leanora Cover, MD;  Location: [facility];  Service: Orthopedics;  Laterality: Right;   COLONOSCOPY WITH PROPOFOL N/A 10/02/2019   Procedure: COLONOSCOPY WITH PROPOFOL;  Surgeon: Lavena Bullion, DO;  [facility];  Service: Gastroenterology;  Laterality: N/A;   COLONOSCOPY WITH PROPOFOL N/A 10/09/2019   Procedure: COLONOSCOPY WITH PROPOFOL;  Surgeon: Gatha Mayer, MD;  [facility];  Service: Endoscopy;  Laterality: N/A;   COLONOSCOPY WITH PROPOFOL N/A 04/29/2020   Procedure: COLONOSCOPY WITH PROPOFOL;  Surgeon: Lavena Bullion, DO;  [facility];  Service: Gastroenterology;  Laterality: N/A;   ELBOW SURGERY Left    ENDOSCOPIC MUCOSAL RESECTION N/A 10/02/2019   Procedure: ENDOSCOPIC MUCOSAL RESECTION;  Surgeon: Lavena Bullion, DO;  [facility];  Service: Gastroenterology;  Laterality: N/A;   HEMOSTASIS CLIP PLACEMENT  10/09/2019   Procedure: HEMOSTASIS CLIP PLACEMENT;  Surgeon: Gatha Mayer, MD;  [facility];  Service: Endoscopy;;   INGUINAL HERNIA REPAIR Right    KNEE SURGERY     OPEN REDUCTION INTERNAL FIXATION (ORIF) DISTAL RADIAL FRACTURE Right 10/29/2015   Procedure: OPEN REDUCTION INTERNAL  FIXATION (ORIF) DISTAL RADIAL FRACTURE;  Surgeon: Leanora Cover, MD;  Location: [facility];  Service: Orthopedics;  Laterality: Right;   ORIF ELBOW FRACTURE Right    POLYPECTOMY  04/29/2020   Procedure: POLYPECTOMY;  Surgeon: Lavena Bullion, DO;  [facility];  Service: Gastroenterology;;   SUBMUCOSAL LIFTING INJECTION  10/02/2019   Procedure: SUBMUCOSAL LIFTING INJECTION;  Surgeon: Lavena Bullion, DO;  [facility];  Service: Gastroenterology;;   TONSILLECTOMY     TOTAL KNEE ARTHROPLASTY Bilateral    TOTAL KNEE REVISION Right 08/25/2021   Procedure: Right knee polyethylene vs total knee arthroplasty revision;  Surgeon: Gaynelle Arabian, MD;  [facility];  Service: Orthopedics;  Laterality: Right;   ULNAR COLLATERAL LIGAMENT REPAIR Right 12/29/2015   Procedure: REPAIR  RIGHT LATERAL ULNAR COLLATERAL LIGAMENT TEAR  EXTENSOR ORIGIN ;  Surgeon: Leanora Cover, MD;  Location: [facility];  Service: Orthopedics;  Laterality: Right;   WRIST ARTHROSCOPY WITH DEBRIDEMENT Right 07/14/2016   Procedure: RIGHT WRIST ARTHROSCOPY WITH DEBRIDEMENT  TRIANGULAR FIBROCARTILAGE COMPLEX;  Surgeon: Leanora Cover, MD;  Location: [facility];  Service: Orthopedics;  Laterality: Right;    Family History  Problem Relation Age of Onset   Alcohol abuse Mother    Ovarian cancer Mother    Alcohol abuse Father    Pancreatic cancer Father    Hyperlipidemia Brother    Barrett's esophagus Brother    Colon cancer Maternal Grandmother    Depression Daughter    Paranoid behavior Daughter    Esophageal cancer Neg Hx    Stomach cancer Neg Hx    Rectal cancer Neg Hx    Colon polyps Neg Hx     Allergies  Allergen Reactions   Penicillins Other (See Comments)    BLISTERS Did it involve swelling of the face/tongue/throat, SOB, or low BP? No Did it involve sudden or severe rash/hives, skin peeling, or any reaction on the inside of your mouth or nose?  No Did you need to seek medical attention at a hospital or doctor's office? No When did it last happen? around 1980    If all above answers are "NO", may proceed with cephalosporin use. Tolerated Cephalosporin Date: 08/26/21.     Lisinopril Diarrhea and Cough    Dry cough, abdominal bloating and diarrhea   Codeine Rash    Current Outpatient Medications on File Prior to Visit  Medication Sig Dispense Refill   albuterol (VENTOLIN HFA) 108 (90 Base) MCG/ACT inhaler Inhale 2 puffs into the lungs every 6 (six) hours as needed for wheezing or shortness of breath. 6.7 g 3   ARIPiprazole ER (ABILIFY MAINTENA) 400 MG PRSY prefilled syringe Inject 400 mg into the muscle every 28 (twenty-eight) days. 1 each 2   benztropine (COGENTIN) 0.5 MG tablet Take 1 tablet (0.5 mg total) by mouth daily. 90 tablet 0   citalopram (CELEXA) 20 MG tablet Take 1 tablet (20 mg total) by mouth daily. 90 tablet 0   ezetimibe (ZETIA) 10 MG tablet TAKE ONE TABLET BY MOUTH AT NOON 90 tablet 3   fenofibrate (TRICOR) 145 MG tablet Take 1 tablet (145 mg total) by mouth daily. 90 tablet 3   ipratropium-albuterol (DUONEB) 0.5-2.5 (3) MG/3ML SOLN Take 3 mLs by nebulization every 6 (six) hours as needed. 360 mL 0   losartan (COZAAR) 50 MG tablet TAKE ONE TABLET BY MOUTH EVERY MORNING 90 tablet 1   Multiple Vitamin (MULTIVITAMIN WITH MINERALS) TABS tablet Take 1 tablet by mouth daily.     Multiple Vitamins-Minerals (PRESERVISION AREDS 2 PO) Take 1 capsule by mouth 2 (two) times daily.     Omega-3 Fatty Acids (FISH OIL) 1000 MG CAPS Take 1,000 mg by mouth in the morning and at bedtime.     pantoprazole (PROTONIX) 40 MG tablet TAKE ONE TABLET BY MOUTH TWICE DAILY 180 tablet 1   temazepam (RESTORIL) 30 MG capsule Take 1 capsule (30 mg total) by mouth at bedtime. 90 capsule 0   Vitamin D, Cholecalciferol, 1000 UNITS TABS Take 1,000 Units by mouth daily.      Current Facility-Administered Medications on File Prior to Visit   Medication Dose Route Frequency Provider Last Rate Last Admin   ARIPiprazole ER (ABILIFY MAINTENA) 400 MG prefilled syringe 400 mg  400 mg Intramuscular Q28 days Arfeen, Arlyce Harman, MD   400 mg at 10/20/21 1007    BP  110/70    Pulse 74    Temp 98.8 F (37.1 C) (Oral)    Ht 5\' 5"  (1.651 m)    Wt 262 lb (118.8 kg)    SpO2 93%    BMI 43.60 kg/m       Objective:   Physical Exam Vitals and nursing note reviewed.  Constitutional:      Appearance: Normal appearance.  HENT:     Nose: Congestion and rhinorrhea present.     Right Turbinates: Enlarged.     Left Turbinates: Enlarged.     Right Sinus: Maxillary sinus tenderness and frontal sinus tenderness present.     Left Sinus: Maxillary sinus tenderness and frontal sinus tenderness present.     Mouth/Throat:     Mouth: Mucous membranes are moist.     Pharynx: Oropharynx is clear. No oropharyngeal exudate.  Eyes:     Extraocular Movements: Extraocular movements intact.     Conjunctiva/sclera: Conjunctivae normal.     Pupils: Pupils are equal, round, and reactive to light.  Cardiovascular:     Rate and Rhythm: Normal rate and regular rhythm.     Pulses: Normal pulses.     Heart sounds: Normal heart sounds.  Pulmonary:     Effort: Pulmonary effort is normal.     Breath sounds: Normal breath sounds.  Musculoskeletal:        General: Normal range of motion.  Skin:    General: Skin is warm and dry.     Capillary Refill: Capillary refill takes less than 2 seconds.  Neurological:     General: No focal deficit present.     Mental Status: He is alert and oriented to person, place, and time.  Psychiatric:        Mood and Affect: Mood normal.        Behavior: Behavior normal.        Thought Content: Thought content normal.        Judgment: Judgment normal.       Assessment & Plan:  1. Acute non-recurrent pansinusitis  - doxycycline (VIBRAMYCIN) 100 MG capsule; Take 1 capsule (100 mg total) by mouth 2 (two) times daily.  Dispense: 20  capsule; Refill: 0   2. Benign prostatic hyperplasia with nocturia  - tamsulosin (FLOMAX) 0.4 MG CAPS capsule; Take 1 capsule (0.4 mg total) by mouth daily.  Dispense: 90 capsule; Refill: 1  [name], NP

## 2021-10-27 ENCOUNTER — Ambulatory Visit (AMBULATORY_SURGERY_CENTER): Payer: Self-pay | Admitting: *Deleted

## 2021-10-27 ENCOUNTER — Other Ambulatory Visit: Payer: Self-pay

## 2021-10-27 VITALS — Ht 68.0 in | Wt 262.0 lb

## 2021-10-27 DIAGNOSIS — Z86010 Personal history of colonic polyps: Secondary | ICD-10-CM

## 2021-10-27 NOTE — Progress Notes (Signed)
Patient's pre-visit was done today over the phone with the patient. Name,DOB and address verified. Patient denies any allergies to Eggs and Soy. Patient denies any problems with anesthesia/sedation. Patient has h/o difficult intubation. Patient is not taking any diet pills or blood thinners. No home Oxygen.   Prep instructions sent to pt's MyChart-pt aware. Patient understands to call us back with any questions or concerns. Patient is aware of our care-partner policy and [id number] safety protocol. Patient has clenpiq at home. The patient is COVID-19 vaccinated.

## 2021-10-28 ENCOUNTER — Telehealth (HOSPITAL_BASED_OUTPATIENT_CLINIC_OR_DEPARTMENT_OTHER): Payer: PPO | Admitting: Psychiatry

## 2021-10-28 ENCOUNTER — Encounter (HOSPITAL_COMMUNITY): Payer: Self-pay | Admitting: Psychiatry

## 2021-10-28 DIAGNOSIS — F411 Generalized anxiety disorder: Secondary | ICD-10-CM | POA: Diagnosis not present

## 2021-10-28 DIAGNOSIS — F319 Bipolar disorder, unspecified: Secondary | ICD-10-CM

## 2021-10-28 MED ORDER — ARIPIPRAZOLE ER 400 MG IM PRSY: 400.0000 mg | PREFILLED_SYRINGE | INTRAMUSCULAR | 2 refills | Status: DC

## 2021-10-28 MED ORDER — CITALOPRAM HYDROBROMIDE 20 MG PO TABS: 20.0000 mg | ORAL_TABLET | Freq: Every day | ORAL | 0 refills | Status: DC

## 2021-10-28 MED ORDER — BENZTROPINE MESYLATE 0.5 MG PO TABS: 0.5000 mg | ORAL_TABLET | Freq: Every day | ORAL | 0 refills | Status: DC

## 2021-10-28 MED ORDER — TEMAZEPAM 30 MG PO CAPS: 30.0000 mg | ORAL_CAPSULE | Freq: Every day | ORAL | 0 refills | Status: DC

## 2021-10-28 NOTE — Progress Notes (Signed)
Virtual Visit via Telephone Note  I connected with Sean Hill on 10/28/21 at  4:20 PM EST by telephone and verified that I am speaking with the correct person using two identifiers.  Location: Patient: Home Provider: Office   I discussed the limitations, risks, security and privacy concerns of performing an evaluation and management service by telephone and the availability of in person appointments. I also discussed with the patient that there may be a patient responsible charge related to this service. The patient expressed understanding and agreed to proceed.   History of Present Illness: Patient is evaluated by phone session.  He had a knee replacement in first week of December.  He admitted it was very difficult and he had a lot of pain but slowly and gradually he is feeling better.  He still have stiffness, swelling and pain but he is not taking any pain medication.  He sleeps not as good as he tends to wake up to go to the bathroom 3-4 times.  He is hoping once pain and his swelling get under control he can sleep better.  He is not driving as his wife taking him to the doctor's appointment.  He had a physical therapy but now he is doing exercise at home.  Patient told his medicine helping his mood.  He remembers a few days ago his wife has a car accident and he had a lot of stress dealing with insurance and getting rental car but he manage things better than he anticipated.  He like to keep the current medication.  He denies any mania, psychosis, panic attack.  He has blood work recently and his hemoglobin A1c remains stable at 6.  His appetite is okay.  His weight is stable.   Past Psychiatric History:  H/O multiple hospitalization.  Last inpatient in July 2014. H/O overdose on Latuda with alcohol. H/O cutting wrist.  Took Tegretol, Depakote, Ambien, Remeron, Vistaril, Geodon, Abilify, lithium, Provigil, Zoloft, Neurontin, Lamictal, Wellbutrin, Risperdal and Pristiq. H/O mania,  aggression, getting speeding tickets, excessive buying and impulsive behavior.  Recent Results (from the past 2160 hour(s))  Glucose, capillary     Status: Abnormal   Collection Time: 08/16/21  9:22 AM  Result Value Ref Range   Glucose-Capillary 140 (H) 70 - 99 mg/dL    Comment: Glucose reference range applies only to samples taken after fasting for at least 8 hours.  CBC     Status: None   Collection Time: 08/16/21  9:40 AM  Result Value Ref Range   WBC 4.4 4.0 - 10.5 K/uL   RBC 5.07 4.22 - 5.81 MIL/uL   Hemoglobin 14.9 13.0 - 17.0 g/dL   HCT 44.4 39.0 - 52.0 %   MCV 87.6 80.0 - 100.0 fL   MCH 29.4 26.0 - 34.0 pg   MCHC 33.6 30.0 - 36.0 g/dL   RDW 14.8 11.5 - 15.5 %   Platelets 233 150 - 400 K/uL   nRBC 0.0 0.0 - 0.2 %    Comment: Performed at [facility], [place] [address] [place], [place] [address]  Comprehensive metabolic panel     Status: Abnormal   Collection Time: 08/16/21  9:40 AM  Result Value Ref Range   Sodium 138 135 - 145 mmol/L   Potassium 4.3 3.5 - 5.1 mmol/L   Chloride 105 98 - 111 mmol/L   CO2 23 22 - 32 mmol/L   Glucose, Bld 119 (H) 70 - 99 mg/dL    Comment: Glucose  reference range applies only to samples taken after fasting for at least 8 hours.   BUN 18 8 - 23 mg/dL   Creatinine, Ser 0.91 0.61 - 1.24 mg/dL   Calcium 9.2 8.9 - 10.3 mg/dL   Total Protein 6.8 6.5 - 8.1 g/dL   Albumin 4.1 3.5 - 5.0 g/dL   AST 43 (H) 15 - 41 U/L   ALT 22 0 - 44 U/L   Alkaline Phosphatase 71 38 - 126 U/L   Total Bilirubin 0.8 0.3 - 1.2 mg/dL   GFR, Estimated >60 >60 mL/min    Comment: (NOTE) Calculated using the CKD-EPI Creatinine Equation (2021)    Anion gap 10 5 - 15    Comment: Performed at [facility], [place] [address] [place], [place] [address]  Surgical pcr screen     Status: Abnormal   Collection Time: 08/16/21  9:40 AM   Specimen: Nasal Mucosa; Nasal Swab  Result Value Ref Range   MRSA, PCR NEGATIVE NEGATIVE   Staphylococcus  aureus POSITIVE (A) NEGATIVE    Comment: (NOTE) The Xpert SA Assay (FDA approved for NASAL specimens in patients 35 years of age and older), is one component of a comprehensive surveillance program. It is not intended to diagnose infection nor to guide or monitor treatment. Performed at [facility], [place] [address], [place], [place] [address]   Protime-INR     Status: None   Collection Time: 08/16/21  9:40 AM  Result Value Ref Range   Prothrombin Time 14.0 11.4 - 15.2 seconds   INR 1.1 0.8 - 1.2    Comment: (NOTE) INR goal varies based on device and disease states. Performed at [facility], [place] [address], [place], [place] [address]   Hemoglobin A1c     Status: Abnormal   Collection Time: 08/16/21  9:40 AM  Result Value Ref Range   Hgb A1c MFr Bld 6.0 (H) 4.8 - 5.6 %    Comment: (NOTE)         Prediabetes: 5.7 - 6.4         Diabetes: >6.4         Glycemic control for adults with diabetes: <7.0    Mean Plasma Glucose 126 mg/dL    Comment: (NOTE) Performed At: [facility] [place], [place] [id number] Rush Farmer MD TD:[id number]   SARS Coronavirus 2 (TAT 6-24 hrs)     Status: None   Collection Time: 08/23/21 12:00 AM  Result Value Ref Range   SARS Coronavirus 2 RESULT: NEGATIVE     Comment: RESULT: NEGATIVESARS-CoV-2 INTERPRETATION:A NEGATIVE  test result means that SARS-CoV-2 RNA was not present in the specimen above the limit of detection of this test. This does not preclude a possible SARS-CoV-2 infection and should not be used as the  sole basis for patient management decisions. Negative results must be combined with clinical observations, patient history, and epidemiological information. Optimum specimen types and timing for peak viral levels during infections caused by SARS-CoV-2  have not been determined. Collection of multiple specimens or types of specimens may be necessary to detect virus. Improper  specimen collection and handling, sequence variability under primers/probes, or organism present below the limit of detection may  lead to false negative results. Positive and negative predictive values of testing are highly dependent on prevalence. False negative test results are more likely when prevalence of disease is high.The expected result is NEGATIVE.Fact S heet for  Healthcare Providers: LocalChronicle.no Sheet for  Patients: [email or web address].es Reference Range - Negative   Glucose, capillary     Status: Abnormal   Collection Time: 08/25/21  6:48 AM  Result Value Ref Range   Glucose-Capillary 133 (H) 70 - 99 mg/dL    Comment: Glucose reference range applies only to samples taken after fasting for at least 8 hours.   Comment 1 Notify RN    Comment 2 Document in Chart   CBC     Status: Abnormal   Collection Time: 08/26/21  3:37 AM  Result Value Ref Range   WBC 12.7 (H) 4.0 - 10.5 K/uL   RBC 4.66 4.22 - 5.81 MIL/uL   Hemoglobin 13.7 13.0 - 17.0 g/dL   HCT 41.2 39.0 - 52.0 %   MCV 88.4 80.0 - 100.0 fL   MCH 29.4 26.0 - 34.0 pg   MCHC 33.3 30.0 - 36.0 g/dL   RDW 14.8 11.5 - 15.5 %   Platelets 219 150 - 400 K/uL   nRBC 0.0 0.0 - 0.2 %    Comment: Performed at [facility], [place] [address], [place], [place] [address]  Basic metabolic panel     Status: Abnormal   Collection Time: 08/26/21  3:37 AM  Result Value Ref Range   Sodium 139 135 - 145 mmol/L   Potassium 4.2 3.5 - 5.1 mmol/L   Chloride 107 98 - 111 mmol/L   CO2 23 22 - 32 mmol/L   Glucose, Bld 186 (H) 70 - 99 mg/dL    Comment: Glucose reference range applies only to samples taken after fasting for at least 8 hours.   BUN 17 8 - 23 mg/dL   Creatinine, Ser 0.98 0.61 - 1.24 mg/dL   Calcium 8.8 (L) 8.9 - 10.3 mg/dL   GFR, Estimated >60 >60 mL/min    Comment: (NOTE) Calculated using the CKD-EPI Creatinine Equation (2021)    Anion gap 9 5 - 15     Comment: Performed at [facility], [place] [address], [place], [place] [address]     Psychiatric Specialty Exam: Physical Exam  Review of Systems  Weight 262 lb (118.8 kg).There is no height or weight on file to calculate BMI.  General Appearance: NA  Eye Contact:  NA  Speech:  Slow  Volume:  Decreased  Mood:  Anxious  Affect:  NA  Thought Process:  Goal Directed  Orientation:  Full (Time, Place, and Person)  Thought Content:  Rumination  Suicidal Thoughts:  No  Homicidal Thoughts:  No  Memory:  Immediate;   Fair Recent;   Fair Remote;   Fair  Judgement:  Intact  Insight:  Present  Psychomotor Activity:  Normal  Concentration:  Concentration: Fair and Attention Span: Fair  Recall:  [employer] of Knowledge:  Good  Language:  Good  Akathisia:  No  Handed:  Right  AIMS (if indicated):     Assets:  Communication Skills Desire for Improvement Housing Social Support  ADL's:  Intact  Cognition:  WNL  Sleep:   fair, up 3-4 times for bathroom      Assessment and Plan: Bipolar disorder type I.  Generalized anxiety disorder.  I reviewed blood work results.  Hemoglobin A1c 6.  He feels a current medicine is working.  We talk about frequent awakening which could be due to pain and going to the bathroom.  Patient does not want to change the medication.  He like to have temazepam 30 mg at bedtime sent to [facility] and  [facility].  We will continue Celexa 20 mg daily, Cogentin 0.5 mg at bedtime and Abilify injection 400 mg intramuscular every 4 weeks.  Recommended to call us back if is any question or any concern.  He is still in therapy with Ivin Booty once a month.  Follow up in 3 months.  Follow Up Instructions:    I discussed the assessment and treatment plan with the patient. The patient was provided an opportunity to ask questions and all were answered. The patient agreed with the plan and demonstrated an understanding of the instructions.   The  patient was advised to call back or seek an in-person evaluation if the symptoms worsen or if the condition fails to improve as anticipated.  I provided 13 minutes of non-face-to-face time during this encounter.   [name], MD

## 2021-10-29 ENCOUNTER — Telehealth: Payer: Self-pay | Admitting: Pharmacist

## 2021-10-29 NOTE — Chronic Care Management (AMB) (Addendum)
Chronic Care Management Pharmacy Assistant   Name: Sean Hill  MRN: [id number] DOB: 06/19/1941  Reason for Encounter: Medication Review / Medication Coordination   Conditions to be addressed/monitored: HTN  Recent office visits:  10/26/21 [name], NP - Patient presented for Acute non recurrent pansinusitis and other concerns. Prescribed Doxycycline Hyclate and Tamsulosin. Stopped Cetrizine.  09/23/21 [name], LPN - Patient presented for Medicare Annual wellness exam. Stopped Doxycycline, Hydrocodone, Meclizine, Methocarbamol, Oxycodone and Prednisone.  09/03/21 [name], NP - Patient presented for Moderate persistent asthma with exacerbation. Prescribed Hydrocodone & Ipratropium - Albuterol  Recent consult visits:  10/28/21 Berniece Andreas - Patient presented for Generalized anxiety disorder. No other visit details available.  10/05/21 Englehorn, Everlean Cherry, LCSW  - Patient presented for Depression. No medication changes noted.  08/19/21 Englehorn, Everlean Cherry, LCSW  - Patient presented for Depression. No medication changes noted.  08/10/21 Herbst, Daleen Snook (PT) - Patient presented for Pain in left shoulder. No other visit details available.  08/06/21 Poff, Ladell Pier (PT) - Patient presented for Pain in left shoulder. No other visit details available.  08/05/21 Berniece Andreas - Patient presented for Bipolar disorder. No other visit details availabl  Hospital visits:  Medication Reconciliation was completed by comparing discharge summary, patients EMR and Pharmacy list, and upon discussion with patient.  Patient presented to [facility] on 09/04/21 due to Failed total knee arthroplasty. Patient was present for 30 hours.  New?Medications Started at [facility] Discharge:?? -started  aspirin methocarbamol (ROBAXIN) oxyCODONE (Oxy IR/ROXICODONE)  Medication Changes at Hospital Discharge: -Changed   Medications Discontinued at Hospital  Discharge: -Stopped  HYDROcodone bit-homatropine 5-1.5 MG/5ML syrup (HYCODAN) predniSONE 20 MG tablet (DELTASONE)  Medications that remain the same after Hospital Discharge:??  -All other medications will remain the same.    Medications: Outpatient Encounter Medications as of 10/29/2021  Medication Sig   albuterol (VENTOLIN HFA) 108 (90 Base) MCG/ACT inhaler Inhale 2 puffs into the lungs every 6 (six) hours as needed for wheezing or shortness of breath.   ARIPiprazole ER (ABILIFY MAINTENA) 400 MG PRSY prefilled syringe Inject 400 mg into the muscle every 28 (twenty-eight) days.   benztropine (COGENTIN) 0.5 MG tablet Take 1 tablet (0.5 mg total) by mouth daily.   citalopram (CELEXA) 20 MG tablet Take 1 tablet (20 mg total) by mouth daily.   doxycycline (VIBRAMYCIN) 100 MG capsule Take 1 capsule (100 mg total) by mouth 2 (two) times daily.   ezetimibe (ZETIA) 10 MG tablet TAKE ONE TABLET BY MOUTH AT NOON   fenofibrate (TRICOR) 145 MG tablet Take 1 tablet (145 mg total) by mouth daily.   ipratropium-albuterol (DUONEB) 0.5-2.5 (3) MG/3ML SOLN Take 3 mLs by nebulization every 6 (six) hours as needed.   losartan (COZAAR) 50 MG tablet TAKE ONE TABLET BY MOUTH EVERY MORNING   Multiple Vitamin (MULTIVITAMIN WITH MINERALS) TABS tablet Take 1 tablet by mouth daily.   Multiple Vitamins-Minerals (PRESERVISION AREDS 2 PO) Take 1 capsule by mouth 2 (two) times daily.   Omega-3 Fatty Acids (FISH OIL) 1000 MG CAPS Take 1,000 mg by mouth in the morning and at bedtime.   pantoprazole (PROTONIX) 40 MG tablet TAKE ONE TABLET BY MOUTH TWICE DAILY   tamsulosin (FLOMAX) 0.4 MG CAPS capsule Take 1 capsule (0.4 mg total) by mouth daily.   temazepam (RESTORIL) 30 MG capsule Take 1 capsule (30 mg total) by mouth at bedtime.   Vitamin D, Cholecalciferol, 1000 UNITS TABS Take 1,000 Units by mouth  daily.    Facility-Administered Encounter Medications as of 10/29/2021  Medication   ARIPiprazole ER (ABILIFY MAINTENA) 400  MG prefilled syringe 400 mg  Reviewed chart prior to disease state call. Spoke with patient regarding BP  Recent Office Vitals: BP Readings from Last 3 Encounters:  10/26/21 110/70  09/23/21 130/61  09/03/21 140/80   Pulse Readings from Last 3 Encounters:  10/26/21 74  09/23/21 68  09/03/21 87    Wt Readings from Last 3 Encounters:  10/27/21 262 lb (118.8 kg)  10/26/21 262 lb (118.8 kg)  09/23/21 273 lb (123.8 kg)     Kidney Function Lab Results  Component Value Date/Time   CREATININE 0.98 08/26/2021 03:37 AM   CREATININE 0.91 08/16/2021 09:40 AM   CREATININE 1.00 09/24/2019 12:21 PM   CREATININE 0.88 12/03/2014 03:47 PM   GFR 83.60 04/29/2021 11:06 AM   GFRNONAA >60 08/26/2021 03:37 AM   GFRNONAA 73 09/24/2019 12:21 PM   GFRAA >60 10/09/2019 05:00 AM   GFRAA 84 09/24/2019 12:21 PM    BMP Latest Ref Rng & Units 08/26/2021 08/16/2021 05/23/2021  Glucose 70 - 99 mg/dL 186(H) 119(H) 180(H)  BUN 8 - 23 mg/dL 17 18 12   Creatinine 0.61 - 1.24 mg/dL 0.98 0.91 0.88  BUN/Creat Ratio 6 - 22 (calc) - - -  Sodium 135 - 145 mmol/L 139 138 142  Potassium 3.5 - 5.1 mmol/L 4.2 4.3 3.7  Chloride 98 - 111 mmol/L 107 105 107  CO2 22 - 32 mmol/L 23 23 25   Calcium 8.9 - 10.3 mg/dL 8.8(L) 9.2 9.5    Current antihypertensive regimen:  Losartan 50 mg 1 tablet daily How often are you checking your Blood Pressure? infrequently Current home BP readings:  BP Readings from Last 3 Encounters:  10/26/21 110/70  09/23/21 130/61  09/03/21 140/80  Patient denies any hyper/hypotensive symptoms What recent interventions/DTPs have been made by any provider to improve Blood Pressure control since last CPP Visit: Patient reports no changes Any recent hospitalizations or ED visits since last visit with CPP? Yes  Adherence Review: Is the patient currently on ACE/ARB medication? Yes Does the patient have >5 day gap between last estimated fill dates? No   Reviewed chart for medication changes ahead  of medication coordination call.  No medication changes indicated  BP Readings from Last 3 Encounters:  10/26/21 110/70  09/23/21 130/61  09/03/21 140/80    Lab Results  Component Value Date   HGBA1C 6.0 (H) 08/16/2021     Patient obtains medications through Adherence Packaging  90 Days    Last adherence delivery included: Certavite senior- one at breakfast Cetirizine 10 mg: one tablet at breakfast Citalopram (CELEXA) 20 mg: one tablet at breakfast Fenofibrate 145 mg: one tablet at breakfast Fish oil 1000 - take one at breakfast and one at evening meal Losartan (COZAAR) 50 MG tablet: one tablet at breakfast Preservision Areds-2gt: one at breakfast and one at bedtime Vitamin D3 1000 u: one tablet at breakfast Benztropine (COGENTIN) 0.5 mg: one tablet at lunch Ezetimibe (ZETIA) 10 mg: one tablet at lunch Temazepam (RESTORIL) 30 mg: one capsule at bedtime Pantoprazole (Protonix) 40 mg : one tab at breakfast and one at dinner  Patient is due for next adherence delivery on: 11/11/21. Called patient and reviewed medications and coordinated delivery. Packaging for 90 DS  This delivery to include: Losartan (COZAAR) 50 MG tablet: one tablet at breakfast Ezetimibe (ZETIA) 10 mg: one tablet at lunch Pantoprazole (Protonix) 40 mg : one tab at  breakfast and one at dinner Benztropine (COGENTIN) 0.5 mg: one tablet at lunch Citalopram (CELEXA) 20 mg: one tablet at breakfast Certavite senior- one tablet at breakfast Preservision Areds-2gt: one at breakfast and one at bedtime Vitamin D3 1000 u: one tablet at breakfast Fish oil 1000 - take one capsule at breakfast and one at evening meal Cetirizine 10 mg: one tablet at breakfast Fenofibrate 145 mg: one tablet at breakfast   Confirmed delivery date of 11/11/21, advised patient that pharmacy will contact them the morning of delivery.   Notes: [facility] *  Care Gaps: BP- 110/70 (10/26/21) AWV- 09/23/21 CCM-  3/23  Star Rating Drugs: Losartan (Cozaar) 50 mg - Last filled 08/06/2021 90 DS at [place] Pharmacist Assistant [phone]

## 2021-11-03 ENCOUNTER — Encounter (HOSPITAL_COMMUNITY): Payer: Self-pay | Admitting: Gastroenterology

## 2021-11-10 ENCOUNTER — Telehealth: Payer: Self-pay | Admitting: Gastroenterology

## 2021-11-10 NOTE — Telephone Encounter (Signed)
Spoke with pt. Pt states he is willing to complete another prep but he can't afford another prep because the Clenpiq was expensive. Previsit stated they have plenvu and sutab samples but not clenpiq. Sent new instructions for plenvu to patient's MyChart and printed instructions out and gave them to front desk. Called pt to let him know we have plenvu samples and that he can come to the second floor to pick up new prep and instructions. Pt verbalized understanding and had no other concerns at end of call.

## 2021-11-10 NOTE — Telephone Encounter (Signed)
Inbound call from patient stated that he had got his days mixed up and thought he was supposed to come in today for his procedure at [facility]. Patient is scheduled for tomorrow at 9:00 and would like to cancel procedure. Please advise.

## 2021-11-11 ENCOUNTER — Ambulatory Visit (HOSPITAL_COMMUNITY)
Admission: RE | Admit: 2021-11-11 | Discharge: 2021-11-11 | Disposition: A | Discharge status: Home / Self Care | Payer: PPO | Attending: Gastroenterology | Admitting: Gastroenterology

## 2021-11-11 ENCOUNTER — Encounter (HOSPITAL_COMMUNITY): Payer: Self-pay | Admitting: Gastroenterology

## 2021-11-11 ENCOUNTER — Ambulatory Visit (HOSPITAL_COMMUNITY): Payer: PPO | Admitting: Anesthesiology

## 2021-11-11 ENCOUNTER — Encounter (HOSPITAL_COMMUNITY)
Admission: RE | Disposition: A | Discharge status: Home / Self Care | Payer: Self-pay | Source: Home / Self Care | Attending: Gastroenterology

## 2021-11-11 ENCOUNTER — Other Ambulatory Visit: Payer: Self-pay

## 2021-11-11 ENCOUNTER — Ambulatory Visit (HOSPITAL_BASED_OUTPATIENT_CLINIC_OR_DEPARTMENT_OTHER): Payer: PPO | Admitting: Anesthesiology

## 2021-11-11 DIAGNOSIS — Z86010 Personal history of colonic polyps: Secondary | ICD-10-CM | POA: Insufficient documentation

## 2021-11-11 DIAGNOSIS — K5730 Diverticulosis of large intestine without perforation or abscess without bleeding: Secondary | ICD-10-CM

## 2021-11-11 DIAGNOSIS — K648 Other hemorrhoids: Secondary | ICD-10-CM

## 2021-11-11 DIAGNOSIS — K635 Polyp of colon: Secondary | ICD-10-CM | POA: Diagnosis not present

## 2021-11-11 DIAGNOSIS — L905 Scar conditions and fibrosis of skin: Secondary | ICD-10-CM | POA: Insufficient documentation

## 2021-11-11 DIAGNOSIS — K641 Second degree hemorrhoids: Secondary | ICD-10-CM

## 2021-11-11 DIAGNOSIS — D123 Benign neoplasm of transverse colon: Secondary | ICD-10-CM | POA: Diagnosis not present

## 2021-11-11 DIAGNOSIS — Z1211 Encounter for screening for malignant neoplasm of colon: Secondary | ICD-10-CM | POA: Insufficient documentation

## 2021-11-11 DIAGNOSIS — Z9889 Other specified postprocedural states: Secondary | ICD-10-CM | POA: Insufficient documentation

## 2021-11-11 DIAGNOSIS — K9189 Other postprocedural complications and disorders of digestive system: Secondary | ICD-10-CM | POA: Diagnosis not present

## 2021-11-11 HISTORY — PX: POLYPECTOMY: SHX5525

## 2021-11-11 HISTORY — PX: COLONOSCOPY WITH PROPOFOL: SHX5780

## 2021-11-11 HISTORY — PX: HEMOSTASIS CLIP PLACEMENT: SHX6857

## 2021-11-11 SURGERY — COLONOSCOPY WITH PROPOFOL
Anesthesia: Monitor Anesthesia Care

## 2021-11-11 MED ORDER — LACTATED RINGERS IV SOLN: INTRAVENOUS | Status: DC | PRN

## 2021-11-11 MED ORDER — LIDOCAINE 2% (20 MG/ML) 5 ML SYRINGE
INTRAMUSCULAR | Status: DC | PRN
  Administered 2021-11-11: 40 mg via INTRAVENOUS

## 2021-11-11 MED ORDER — PROPOFOL 10 MG/ML IV BOLUS
INTRAVENOUS | Status: DC | PRN
  Administered 2021-11-11: 20 mg via INTRAVENOUS
  Administered 2021-11-11: 40 mg via INTRAVENOUS
  Administered 2021-11-11: 20 mg via INTRAVENOUS

## 2021-11-11 MED ORDER — PROPOFOL 500 MG/50ML IV EMUL
INTRAVENOUS | Status: DC | PRN
  Administered 2021-11-11: 125 ug/kg/min via INTRAVENOUS

## 2021-11-11 SURGICAL SUPPLY — 22 items

## 2021-11-11 NOTE — Interval H&P Note (Signed)
History and Physical Interval Note:  11/11/2021 9:01 AM  Sean Hill  has presented today for surgery, with the diagnosis of hx of colon polyps.  The various methods of treatment have been discussed with the patient and family. After consideration of risks, benefits and other options for treatment, the patient has consented to  Procedure(s): COLONOSCOPY WITH PROPOFOL (N/A) as a surgical intervention.  The patient's history has been reviewed, patient examined, no change in status, stable for surgery.  I have reviewed the patient's chart and labs.  Questions were answered to the patient's satisfaction.     Dominic Pea Abbie Berling

## 2021-11-11 NOTE — Transfer of Care (Signed)
Immediate Anesthesia Transfer of Care Note  Patient: Sean Hill  Procedure(s) Performed: COLONOSCOPY WITH PROPOFOL POLYPECTOMY HEMOSTASIS CLIP PLACEMENT  Patient Location: PACU and Endoscopy Unit  Anesthesia Type:MAC  Level of Consciousness: awake and alert   Airway & Oxygen Therapy: Patient Spontanous Breathing and Patient connected to face mask oxygen  Post-op Assessment: Report given to RN and Post -op Vital signs reviewed and stable  Post vital signs: Reviewed and stable  Last Vitals:  Vitals Value Taken Time  BP 138/81 11/11/21 1024  Temp 37.1 C 11/11/21 1024  Pulse 87 11/11/21 1029  Resp 19 11/11/21 1029  SpO2 94 % 11/11/21 1029  Vitals shown include unvalidated device data.  Last Pain:  Vitals:   11/11/21 1024  TempSrc: Tympanic  PainSc: Asleep         Complications: No notable events documented.

## 2021-11-11 NOTE — H&P (Addendum)
GASTROENTEROLOGY PROCEDURE H&P NOTE   Primary Care Physician: [name], NP    Reason for Procedure:  Polyp surveillance  Plan:    Colonoscopy  Patient is appropriate for endoscopic procedure(s) at [facility] Endoscopy unit.    The nature of the procedure, as well as the risks, benefits, and alternatives were carefully and thoroughly reviewed with the patient. Ample time for discussion and questions allowed. The patient understood, was satisfied, and agreed to proceed.     HPI: Sean Hill is a 81 y.o. male who presents for repeat colonoscopy for ongoing short interval surveillance.   Endoscopic history: - Positive Cologuard in 2016 -Colonoscopy (08/2019, Dr. 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 Bryan Lemma): 3 cm HH, irregular Z-line (biopsy: Reflux changes without intestinal metaplasia), Gastric hyperplastic polyps.  Empiric dilation with 51 Fr Savary -CT abdomen/pelvis (09/30/2019): No discrete mass.  No lymphadenopathy. -Colonoscopy (10/02/2019, Dr. Bryan Lemma): EMR of 25 mm polyp in ascending colon (TVA), left-sided diverticulosis, internal hemorrhoids -Admitted January 19-21, 2021 with post polypectomy bleed.  Hemodynamically stable, stable hemoglobin.  No blood products needed -Colonoscopy (10/09/2019, Dr. Carlean Purl): 20 mm ulcer in proximal ascending colon without active bleeding but positive stigmata.  Treated with 11 clips.  Difficult positioning and significant respiratory-dependent motion in colon. - Colonoscopy (04/29/2020): Tattoo in ascending colon.  Single retained clip in proximal ascending colon 1 cm away from post polypectomy scar.  Underlying tissue with polypoid appearance (path: Inflammatory tissue).  Subtle nodularity around post polypectomy site (path:).  2 mm ascending colon adenoma, 3 mm sigmoid adenoma.  Sigmoid  diverticulosis, internal hemorrhoids.  Recommended repeat in 1 year  Past Medical History:  Diagnosis Date   Anal fissure    Anxiety    Arthritis    right wrist; pt. states "everywhere"   Asthma    Bipolar disorder ([place])    Cataract    Depression    Difficult airway for intubation    GERD (gastroesophageal reflux disease)    Hemorrhage of colon following colonoscopy 10/08/2019   History of kidney stones    History of MRSA infection    left knee after arthroplasty   Hyperlipidemia    Hypertension    states under control with med., has been on med. x 1 yr.   Impaired hearing    Left bundle branch block (LBBB) on electrocardiogram 10/29/2015   Macular degeneration (senile) of retina    left   Pre-diabetes    Wears glasses     Past Surgical History:  Procedure Laterality Date   ANKLE FUSION Bilateral    ANKLE SURGERY Bilateral    ligament surgery   BIOPSY  04/29/2020   Procedure: BIOPSY;  Surgeon: Lavena Bullion, DO;  Location: [facility];  Service: Gastroenterology;;   CARDIAC CATHETERIZATION  x 2   1983; 11/14/2003   CARPOMETACARPEL SUSPENSION PLASTY Right 09/22/2016   Procedure: right thumb carpometacarpal  ARTHROPLASTY;  Surgeon: Leanora Cover, MD;  Location: [place];  Service: Orthopedics;  Laterality: Right;  right thumb carpometacarpal  ARTHROPLASTY   CARPOMETACARPEL SUSPENSION PLASTY Right 11/02/2017   Procedure: RIGHT THUMB SUSPENSIONPLASTY WITH TIGHTROPE, DISTAL POLE SCAPHOID  EXCISION;  Surgeon: Leanora Cover, MD;  Location: [place];  Service: Orthopedics;  Laterality: Right;   COLONOSCOPY WITH PROPOFOL N/A 10/02/2019   Procedure: COLONOSCOPY WITH PROPOFOL;  Surgeon: Lavena Bullion, DO;  Location: [facility];  Service: Gastroenterology;  Laterality: N/A;   COLONOSCOPY WITH PROPOFOL N/A 10/09/2019   Procedure: COLONOSCOPY WITH PROPOFOL;  Surgeon: Gatha Mayer, MD;  Location: [facility];  Service: Endoscopy;   Laterality: N/A;   COLONOSCOPY WITH PROPOFOL N/A 04/29/2020   Procedure: COLONOSCOPY WITH PROPOFOL;  Surgeon: Lavena Bullion, DO;  Location: [facility];  Service: Gastroenterology;  Laterality: N/A;   ELBOW SURGERY Left    ENDOSCOPIC MUCOSAL RESECTION N/A 10/02/2019   Procedure: ENDOSCOPIC MUCOSAL RESECTION;  Surgeon: Lavena Bullion, DO;  Location: [facility];  Service: Gastroenterology;  Laterality: N/A;   HEMOSTASIS CLIP PLACEMENT  10/09/2019   Procedure: HEMOSTASIS CLIP PLACEMENT;  Surgeon: Gatha Mayer, MD;  Location: [facility];  Service: Endoscopy;;   INGUINAL HERNIA REPAIR Right    KNEE SURGERY     OPEN REDUCTION INTERNAL FIXATION (ORIF) DISTAL RADIAL FRACTURE Right 10/29/2015   Procedure: OPEN REDUCTION INTERNAL FIXATION (ORIF) DISTAL RADIAL FRACTURE;  Surgeon: Leanora Cover, MD;  Location: [place];  Service: Orthopedics;  Laterality: Right;   ORIF ELBOW FRACTURE Right    POLYPECTOMY  04/29/2020   Procedure: POLYPECTOMY;  Surgeon: Lavena Bullion, DO;  Location: [facility];  Service: Gastroenterology;;   SUBMUCOSAL LIFTING INJECTION  10/02/2019   Procedure: SUBMUCOSAL LIFTING INJECTION;  Surgeon: Lavena Bullion, DO;  Location: [facility];  Service: Gastroenterology;;   TONSILLECTOMY     TOTAL KNEE ARTHROPLASTY Bilateral    TOTAL KNEE REVISION Right 08/25/2021   Procedure: Right knee polyethylene vs total knee arthroplasty revision;  Surgeon: Gaynelle Arabian, MD;  Location: [facility];  Service: Orthopedics;  Laterality: Right;   ULNAR COLLATERAL LIGAMENT REPAIR Right 12/29/2015   Procedure: REPAIR  RIGHT LATERAL ULNAR COLLATERAL LIGAMENT TEAR  EXTENSOR ORIGIN ;  Surgeon: Leanora Cover, MD;  Location: [place];  Service: Orthopedics;  Laterality: Right;   WRIST ARTHROSCOPY WITH DEBRIDEMENT Right 07/14/2016   Procedure: RIGHT WRIST ARTHROSCOPY WITH DEBRIDEMENT  TRIANGULAR FIBROCARTILAGE COMPLEX;  Surgeon: Leanora Cover, MD;  Location:  [place];  Service: Orthopedics;  Laterality: Right;    Prior to Admission medications   Medication Sig Start Date End Date Taking? Authorizing Provider  ARIPiprazole ER (ABILIFY MAINTENA) 400 MG PRSY prefilled syringe Inject 400 mg into the muscle every 28 (twenty-eight) days. 10/28/21 10/28/22 Yes Arfeen, Arlyce Harman, MD  benztropine (COGENTIN) 0.5 MG tablet Take 1 tablet (0.5 mg total) by mouth daily. 10/28/21  Yes Arfeen, Arlyce Harman, MD  citalopram (CELEXA) 20 MG tablet Take 1 tablet (20 mg total) by mouth daily. 10/28/21 10/28/22 Yes Arfeen, Arlyce Harman, MD  doxycycline (VIBRAMYCIN) 100 MG capsule Take 1 capsule (100 mg total) by mouth 2 (two) times daily. 10/26/21  Yes Nafziger, Tommi Rumps, NP  ezetimibe (ZETIA) 10 MG tablet TAKE ONE TABLET BY MOUTH AT NOON Patient taking differently: Take 10 mg by mouth daily. 05/03/21  Yes Nafziger, Tommi Rumps, NP  fenofibrate (TRICOR) 145 MG tablet Take 1 tablet (145 mg total) by mouth daily. 04/30/21  Yes Nafziger, Tommi Rumps, NP  ipratropium-albuterol (DUONEB) 0.5-2.5 (3) MG/3ML SOLN Take 3 mLs by nebulization every 6 (six) hours as needed. Patient taking differently: Take 3 mLs by nebulization every 6 (six) hours as needed (shortness of breath). 09/03/21  Yes Nafziger, Tommi Rumps, NP  losartan (COZAAR) 50 MG tablet TAKE ONE TABLET BY MOUTH EVERY MORNING Patient taking differently: Take 50 mg by mouth daily. 10/25/21  Yes Nafziger, Tommi Rumps, NP  Multiple Vitamin (MULTIVITAMIN WITH MINERALS) TABS tablet Take 1 tablet by mouth daily.  Yes [provider]  Multiple Vitamins-Minerals (PRESERVISION AREDS 2 PO) Take 1 capsule by mouth 2 (two) times daily.   Yes [provider]  naproxen sodium (ALEVE) 220 MG tablet Take 440 mg by mouth daily as needed (pain).   Yes [provider]  Omega-3 Fatty Acids (FISH OIL) 1000 MG CAPS Take 1,000 mg by mouth in the morning and at bedtime.   Yes [provider]  oxymetazoline (AFRIN) 0.05 % nasal spray Place 1 spray into  both nostrils 2 (two) times daily as needed for congestion.   Yes [provider]  pantoprazole (PROTONIX) 40 MG tablet TAKE ONE TABLET BY MOUTH TWICE DAILY Patient taking differently: 40 mg 2 (two) times daily. 10/25/21  Yes Caylin Nass V, DO  tamsulosin (FLOMAX) 0.4 MG CAPS capsule Take 1 capsule (0.4 mg total) by mouth daily. 10/26/21  Yes Nafziger, Tommi Rumps, NP  temazepam (RESTORIL) 30 MG capsule Take 1 capsule (30 mg total) by mouth at bedtime. 10/28/21  Yes Arfeen, Arlyce Harman, MD  Vitamin D, Cholecalciferol, 1000 UNITS TABS Take 1,000 Units by mouth daily.    Yes [provider]  albuterol (VENTOLIN HFA) 108 (90 Base) MCG/ACT inhaler Inhale 2 puffs into the lungs every 6 (six) hours as needed for wheezing or shortness of breath. 12/29/20   Nafziger, Tommi Rumps, NP    No current facility-administered medications for this encounter.    Allergies as of 09/21/2021 - Review Complete 09/03/2021  Allergen Reaction Noted   Penicillins Other (See Comments) 07/20/2011   Lisinopril Diarrhea and Cough 06/19/2020   Codeine Rash 04/23/2013    Family History  Problem Relation Age of Onset   Alcohol abuse Mother    Ovarian cancer Mother    Alcohol abuse Father    Pancreatic cancer Father    Hyperlipidemia Brother    Barrett's esophagus Brother    Colon cancer Maternal Grandmother    Depression Daughter    Paranoid behavior Daughter    Esophageal cancer Neg Hx    Stomach cancer Neg Hx    Rectal cancer Neg Hx    Colon polyps Neg Hx     Social History   Socioeconomic History   Marital status: Married    Spouse name: Not on file   Number of children: Not on file   Years of education: Not on file   Highest education level: Not on file  Occupational History   Not on file  Tobacco Use   Smoking status: Never   Smokeless tobacco: Never  Vaping Use   Vaping Use: Never used  Substance and Sexual Activity   Alcohol use: Not Currently    Comment: occasional.   Drug use: No   Sexual  activity: Not Currently    Birth control/protection: None  Other Topics Concern   Not on file  Social History Narrative   Married, lives in [place].  Retired Dance movement psychotherapist.   Two daughters, both married,3 grandchildren ( 9, 44, 1 year)    No regular exercise.  Never smoker.  No ETOH.  No drugs.   Social Determinants of Health   Financial Resource Strain: Low Risk    Difficulty of Paying Living Expenses: Not hard at all  Food Insecurity: No Food Insecurity   Worried About Charity fundraiser in the Last Year: Never true   Startup in the Last Year: Never true  Transportation Needs: No Transportation Needs   Lack of Transportation (Medical): No   Lack of Transportation (  Non-Medical): No  Physical Activity: Inactive   Days of Exercise per Week: 0 days   Minutes of Exercise per Session: 0 min  Stress: No Stress Concern Present   Feeling of Stress : Not at all  Social Connections: Moderately Isolated   Frequency of Communication with Friends and Family: More than three times a week   Frequency of Social Gatherings with Friends and Family: More than three times a week   Attends Religious Services: Never   Marine scientist or Organizations: No   Attends Music therapist: Never   Marital Status: Married  Human resources officer Violence: Not At Risk   Fear of Current or Ex-Partner: No   Emotionally Abused: No   Physically Abused: No   Sexually Abused: No    Physical Exam: Vital signs in last 24 hours: @BP  (!) 149/60    Pulse (!) 105    Temp 98.6 F (37 C) (Temporal)    Resp (!) 22    Ht 5\' 10"  (1.778 m)    Wt 117.9 kg    SpO2 95%    BMI 37.31 kg/m  GEN: NAD EYE: Sclerae anicteric ENT: MMM CV: Non-tachycardic Pulm: CTA b/l GI: Soft, NT/ND NEURO:  Alert & Oriented x 3   Gerrit Heck, DO [facility]   11/11/2021 8:55 AM

## 2021-11-11 NOTE — Anesthesia Procedure Notes (Signed)
Date/Time: 11/11/2021 9:45 AM Performed by: Cynda Familia, CRNA Pre-anesthesia Checklist: Patient identified, Emergency Drugs available, Suction available, Patient being monitored and Timeout performed Patient Re-evaluated:Patient Re-evaluated prior to induction Oxygen Delivery Method: Simple face mask Placement Confirmation: positive ETCO2 and breath sounds checked- equal and bilateral

## 2021-11-11 NOTE — Anesthesia Preprocedure Evaluation (Addendum)
Anesthesia Evaluation  Patient identified by MRN, date of birth, ID band Patient awake    Reviewed: Allergy & Precautions, NPO status , Patient's Chart, lab work & pertinent test results  History of Anesthesia Complications (+) DIFFICULT AIRWAY and history of anesthetic complications  Airway Mallampati: IV  TM Distance: >3 FB Neck ROM: Full    Dental no notable dental hx. (+) Teeth Intact, Dental Advisory Given   Pulmonary asthma ,    Pulmonary exam normal breath sounds clear to auscultation       Cardiovascular hypertension, Pt. on medications Normal cardiovascular exam Rhythm:Regular Rate:Normal  TTE 2017 - Left ventricle: The cavity size was normal. Wall thickness was  increased in a pattern of mild LVH. Systolic function was normal.  The estimated ejection fraction was in the range of 60% to 65%.  Doppler parameters are consistent with abnormal left ventricular  relaxation (grade 1 diastolic dysfunction).  - Mitral valve: There was mild regurgitation.  - Pulmonary arteries: PA peak pressure: 33 mm Hg (S).   Neuro/Psych PSYCHIATRIC DISORDERS Anxiety Depression Bipolar Disorder negative neurological ROS     GI/Hepatic Neg liver ROS, GERD  Controlled and Medicated,  Endo/Other  negative endocrine ROSObese BMI 37  Renal/GU negative Renal ROS  negative genitourinary   Musculoskeletal  (+) Arthritis ,   Abdominal   Peds  Hematology negative hematology ROS (+)   Anesthesia Other Findings   Reproductive/Obstetrics                            Anesthesia Physical Anesthesia Plan  ASA: 3  Anesthesia Plan: MAC   Post-op Pain Management:    Induction: Intravenous  PONV Risk Score and Plan: Propofol infusion and Treatment may vary due to age or medical condition  Airway Management Planned: Natural Airway  Additional Equipment:   Intra-op Plan:   Post-operative Plan:    Informed Consent: I have reviewed the patients History and Physical, chart, labs and discussed the procedure including the risks, benefits and alternatives for the proposed anesthesia with the patient or authorized representative who has indicated his/her understanding and acceptance.     Dental advisory given  Plan Discussed with: CRNA  Anesthesia Plan Comments:         Anesthesia Quick Evaluation

## 2021-11-11 NOTE — Discharge Instructions (Signed)
YOU HAD AN ENDOSCOPIC PROCEDURE TODAY: Refer to the procedure report and other information in the discharge instructions given to you for any specific questions about what was found during the examination. If this information does not answer your questions, please call [place] office at [phone] to clarify.  ° °YOU SHOULD EXPECT: Some feelings of bloating in the abdomen. Passage of more gas than usual. Walking can help get rid of the air that was put into your GI tract during the procedure and reduce the bloating. If you had a lower endoscopy (such as a colonoscopy or flexible sigmoidoscopy) you may notice spotting of blood in your stool or on the toilet paper. Some abdominal soreness may be present for a day or two, also. ° °DIET: Your first meal following the procedure should be a light meal and then it is ok to progress to your normal diet. A half-sandwich or bowl of soup is an example of a good first meal. Heavy or fried foods are harder to digest and may make you feel nauseous or bloated. Drink plenty of fluids but you should avoid alcoholic beverages for 24 hours. If you had a esophageal dilation, please see attached instructions for diet.   ° °ACTIVITY: Your care partner should take you home directly after the procedure. You should plan to take it easy, moving slowly for the rest of the day. You can resume normal activity the day after the procedure however YOU SHOULD NOT DRIVE, use power tools, machinery or perform tasks that involve climbing or major physical exertion for 24 hours (because of the sedation medicines used during the test).  ° °SYMPTOMS TO REPORT IMMEDIATELY: °A gastroenterologist can be reached at any hour. Please call [phone]  for any of the following symptoms:  °Following lower endoscopy (colonoscopy, flexible sigmoidoscopy) °Excessive amounts of blood in the stool  °Significant tenderness, worsening of abdominal pains  °Swelling of the abdomen that is new, acute  °Fever of 100° or  higher  °Following upper endoscopy (EGD, EUS, ERCP, esophageal dilation) °Vomiting of blood or coffee ground material  °New, significant abdominal pain  °New, significant chest pain or pain under the shoulder blades  °Painful or persistently difficult swallowing  °New shortness of breath  °Black, tarry-looking or red, bloody stools ° °FOLLOW UP:  °If any biopsies were taken you will be contacted by phone or by letter within the next 1-3 weeks. Call [phone]  if you have not heard about the biopsies in 3 weeks.  °Please also call with any specific questions about appointments or follow up tests. ° °

## 2021-11-11 NOTE — Op Note (Signed)
[facility] Patient Name: Sean Hill Procedure Date: 11/11/2021 MRN: [id number] Attending MD: Gerrit Heck , MD Date of Birth: [birth year] CSN: [id number] Age: 81 Admit Type: Outpatient Procedure:                Colonoscopy Indications:              Surveillance: History of numerous (> 10) adenomas                            on last colonoscopy (< 3 yrs), Surveillance:                            History of piecemeal removal adenoma on last                            colonoscopy (< 3 yrs)                           Colonoscopy (08/2019, Dr. 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 Bryan Lemma): EMR of 25                            mm polyp in ascending colon (TVA), left-sided                            diverticulosis, internal hemorrhoids                           Admitted January 19-21, 2021 with post polypectomy                            bleed. Hemodynamically stable, stable hemoglobin.                            No blood products needed                           Colonoscopy (10/09/2019, Dr. Carlean Purl): 20 mm ulcer                            in proximal ascending colon without active bleeding                            but positive stigmata. Treated with 11 clips.  Difficult positioning and significant                            respiratorydependent motion incolon.                            Colonoscopy (04/29/2020): Tattoo in ascending                            colon. Single retained clip in proximal ascending                            colon 1 cm away from post polypectomy scar.                            Underlying tissue with polypoid appearance (path:                             Inflammatory tissue). Subtle nodularity around post                            polypectomy site (path:). 2 mm ascending colon                            adenoma, 3 mm sigmoid adenoma. Sigmoid                            diverticulosis, internal hemorrhoids. Recommended                            repeat in 1 year Providers:                Gerrit Heck, MD, Dulcy Fanny, Luan Moore, Technician, Glenis Smoker, CRNA Referring MD:              Medicines:                Monitored Anesthesia Care Complications:            No immediate complications. Estimated Blood Loss:     Estimated blood loss was minimal. Procedure:                Pre-Anesthesia Assessment:                           - Prior to the procedure, a History and Physical                            was performed, and patient medications and                            allergies were reviewed. The patient's tolerance of                            previous anesthesia was also reviewed. The risks  and benefits of the procedure and the sedation                            options and risks were discussed with the patient.                            All questions were answered, and informed consent                            was obtained. Prior Anticoagulants: The patient has                            taken no previous anticoagulant or antiplatelet                            agents. ASA Grade Assessment: III - A patient with                            severe systemic disease. After reviewing the risks                            and benefits, the patient was deemed in                            satisfactory condition to undergo the procedure.                           After obtaining informed consent, the colonoscope                            was passed under direct vision. Throughout the                            procedure, the patient's blood pressure, pulse, and                             oxygen saturations were monitored continuously. The                            [id number]-HQ190L ([id number]) Olympus colonoscope was                            introduced through the anus and advanced to the the                            cecum, identified by appendiceal orifice and                            ileocecal valve. The colonoscopy was performed                            without difficulty. The patient tolerated the  procedure well. The quality of the bowel                            preparation was good. The ileocecal valve,                            appendiceal orifice, and rectum were photographed. Scope In: 9:58:48 AM Scope Out: 10:16:27 AM Scope Withdrawal Time: 0 hours 14 minutes 54 seconds  Total Procedure Duration: 0 hours 17 minutes 39 seconds  Findings:      The perianal and digital rectal examinations were normal.      A 5 mm polyp was found in the transverse colon. The polyp was sessile.       The polyp was removed with a cold snare. Resection and retrieval were       complete. There was mild, persistent oozing at the polypectomy site.       Given history of post polypectomy bleed and persistent oozing, two       hemostatic clips were successfully placed (MR conditional) with complete       cessation of bleeding. There was no bleeding at the end of the procedure.      Multiple small and large-mouthed diverticula were found in the sigmoid       colon, descending colon and ascending colon.      A post polypectomy scar was found in the proximal ascending colon. The       scar tissue was healthy in appearance on white light and narrow band       imaging.      A tattoo was seen in the ascending colon, located approximately 3 cm       distal to the polypectomy site. The tattoo site appeared normal.      Non-bleeding internal hemorrhoids were found during retroflexion. The       hemorrhoids were small. Impression:                - One 5 mm polyp in the transverse colon, removed                            with a cold snare. Resected and retrieved. Clips                            (MR conditional) were placed.                           - Diverticulosis in the sigmoid colon, in the                            descending colon and in the ascending colon.                           - Post-polypectomy scar in the proximal ascending                            colon.                           - A tattoo was seen in the ascending colon. The  tattoo site appeared normal.                           - Non-bleeding internal hemorrhoids. Moderate Sedation:      Not Applicable - Patient had care per Anesthesia. Recommendation:           - Patient has a contact number available for                            emergencies. The signs and symptoms of potential                            delayed complications were discussed with the                            patient. Return to normal activities tomorrow.                            Written discharge instructions were provided to the                            patient.                           - Resume previous diet.                           - Continue present medications.                           - Await pathology results.                           - Repeat colonoscopy in 3 years for surveillance.                           - Due to documented history of difficult airway and                            Mallampati 4, procedures will continue to be                            performed at [facility].                           - Return to GI clinic PRN. Procedure Code(s):        --- Professional ---                           [phone], Colonoscopy, flexible; with removal of                            tumor(s), polyp(s), or other lesion(s) by snare                            technique Diagnosis Code(s):        ---  Professional ---                            K63.5, Polyp of colon                           K64.8, Other hemorrhoids                           Z98.890, Other specified postprocedural states                           Z86.010, Personal history of colonic polyps                           K57.30, Diverticulosis of large intestine without                            perforation or abscess without bleeding CPT copyright 2019 [employer]. All rights reserved. The codes documented in this report are preliminary and upon coder review may  be revised to meet current compliance requirements. Gerrit Heck, MD 11/11/2021 10:26:18 AM Number of Addenda: 0

## 2021-11-11 NOTE — Anesthesia Postprocedure Evaluation (Signed)
Anesthesia Post Note  Patient: Sean Hill  Procedure(s) Performed: COLONOSCOPY WITH PROPOFOL POLYPECTOMY HEMOSTASIS CLIP PLACEMENT     Patient location during evaluation: Endoscopy Anesthesia Type: MAC Level of consciousness: awake and alert Pain management: pain level controlled Vital Signs Assessment: post-procedure vital signs reviewed and stable Respiratory status: spontaneous breathing, nonlabored ventilation, respiratory function stable and patient connected to nasal cannula oxygen Cardiovascular status: blood pressure returned to baseline and stable Postop Assessment: no apparent nausea or vomiting Anesthetic complications: no   No notable events documented.  Last Vitals:  Vitals:   11/11/21 1034 11/11/21 1044  BP: 139/67 (!) 143/67  Pulse: 79 70  Resp: (!) 21 18  Temp:    SpO2: 95% 96%    Last Pain:  Vitals:   11/11/21 1044  TempSrc:   PainSc: 0-No pain                 Kennadi Albany L Taelon Bendorf

## 2021-11-12 ENCOUNTER — Encounter (HOSPITAL_COMMUNITY): Payer: Self-pay | Admitting: Gastroenterology

## 2021-11-12 LAB — SURGICAL PATHOLOGY

## 2021-11-15 ENCOUNTER — Telehealth: Payer: Self-pay | Admitting: Pharmacist

## 2021-11-15 NOTE — Chronic Care Management (AMB) (Signed)
° ° °  Chronic Care Management Pharmacy Assistant   Name: Sean Hill  MRN: [id number] DOB: 1941-05-27 11/15/21 APPOINTMENT REMINDER  Patient was reminded to have all medications, supplements and any blood glucose and blood pressure readings available for review with Jeni Salles, Pharm. D, for telephone visit on 11/17/21 at 1.  Care Gaps: BP- 143/67 ( 11/11/21) AWV- 09/23/21  Star Rating Drug: Losartan (Cozaar) 50 mg - Last filled 11/05/21 90 DS at [facility]  Any gaps in medications fill history? None  Medications: Outpatient Encounter Medications as of 11/15/2021  Medication Sig Note   albuterol (VENTOLIN HFA) 108 (90 Base) MCG/ACT inhaler Inhale 2 puffs into the lungs every 6 (six) hours as needed for wheezing or shortness of breath.    ARIPiprazole ER (ABILIFY MAINTENA) 400 MG PRSY prefilled syringe Inject 400 mg into the muscle every 28 (twenty-eight) days.    benztropine (COGENTIN) 0.5 MG tablet Take 1 tablet (0.5 mg total) by mouth daily.    citalopram (CELEXA) 20 MG tablet Take 1 tablet (20 mg total) by mouth daily.    doxycycline (VIBRAMYCIN) 100 MG capsule Take 1 capsule (100 mg total) by mouth 2 (two) times daily. 11/05/2021: Finished 11-05-21   ezetimibe (ZETIA) 10 MG tablet TAKE ONE TABLET BY MOUTH AT NOON (Patient taking differently: Take 10 mg by mouth daily.)    fenofibrate (TRICOR) 145 MG tablet Take 1 tablet (145 mg total) by mouth daily.    ipratropium-albuterol (DUONEB) 0.5-2.5 (3) MG/3ML SOLN Take 3 mLs by nebulization every 6 (six) hours as needed. (Patient taking differently: Take 3 mLs by nebulization every 6 (six) hours as needed (shortness of breath).)    losartan (COZAAR) 50 MG tablet TAKE ONE TABLET BY MOUTH EVERY MORNING (Patient taking differently: Take 50 mg by mouth daily.)    Multiple Vitamin (MULTIVITAMIN WITH MINERALS) TABS tablet Take 1 tablet by mouth daily.    Multiple Vitamins-Minerals (PRESERVISION AREDS 2 PO) Take 1 capsule by mouth 2 (two) times  daily.    naproxen sodium (ALEVE) 220 MG tablet Take 440 mg by mouth daily as needed (pain).    Omega-3 Fatty Acids (FISH OIL) 1000 MG CAPS Take 1,000 mg by mouth in the morning and at bedtime.    oxymetazoline (AFRIN) 0.05 % nasal spray Place 1 spray into both nostrils 2 (two) times daily as needed for congestion.    pantoprazole (PROTONIX) 40 MG tablet TAKE ONE TABLET BY MOUTH TWICE DAILY (Patient taking differently: 40 mg 2 (two) times daily.)    tamsulosin (FLOMAX) 0.4 MG CAPS capsule Take 1 capsule (0.4 mg total) by mouth daily.    temazepam (RESTORIL) 30 MG capsule Take 1 capsule (30 mg total) by mouth at bedtime.    Vitamin D, Cholecalciferol, 1000 UNITS TABS Take 1,000 Units by mouth daily.     Facility-Administered Encounter Medications as of 11/15/2021  Medication   ARIPiprazole ER (ABILIFY MAINTENA) 400 MG prefilled syringe 400 mg     [facility] Clinical Pharmacist Assistant [phone]

## 2021-11-16 ENCOUNTER — Encounter: Payer: Self-pay | Admitting: Gastroenterology

## 2021-11-17 ENCOUNTER — Other Ambulatory Visit: Payer: Self-pay

## 2021-11-17 ENCOUNTER — Encounter (HOSPITAL_COMMUNITY): Payer: Self-pay | Admitting: *Deleted

## 2021-11-17 ENCOUNTER — Ambulatory Visit (HOSPITAL_BASED_OUTPATIENT_CLINIC_OR_DEPARTMENT_OTHER): Payer: PPO | Admitting: *Deleted

## 2021-11-17 ENCOUNTER — Ambulatory Visit (INDEPENDENT_AMBULATORY_CARE_PROVIDER_SITE_OTHER): Payer: PPO | Admitting: Pharmacist

## 2021-11-17 ENCOUNTER — Other Ambulatory Visit: Payer: Self-pay | Admitting: Adult Health

## 2021-11-17 VITALS — BP 173/71 | HR 86 | Temp 98.0°F | Resp 20 | Ht 66.0 in | Wt 269.6 lb

## 2021-11-17 DIAGNOSIS — I10 Essential (primary) hypertension: Secondary | ICD-10-CM

## 2021-11-17 DIAGNOSIS — F319 Bipolar disorder, unspecified: Secondary | ICD-10-CM

## 2021-11-17 DIAGNOSIS — E7849 Other hyperlipidemia: Secondary | ICD-10-CM

## 2021-11-17 MED ORDER — ICOSAPENT ETHYL 1 G PO CAPS: 2.0000 g | ORAL_CAPSULE | Freq: Two times a day (BID) | ORAL | 3 refills | Status: DC

## 2021-11-17 NOTE — Patient Instructions (Signed)
Pt presents today for due Abilify Maintena 400 mg injection. Pt appropriate, cooperative, and pleasant on approach. Affect appropriate to mood. Injection prepared and given as ordered in left upper outer quadrant without complaint. Pt to return in approximately 28 days for next due injection. ?

## 2021-11-17 NOTE — Patient Instructions (Signed)
Hi Sean Hill, ? ?It was great to catch up with you again! I attached some extra info about checking your blood pressure that you may find helpful. ? ?Please reach out to me if you have any questions or need anything before our follow up! ? ?Best, ?Maddie ? ?Jeni Salles, PharmD, BCACP ?Clinical Pharmacist ?Therapist, music at [facility] ?[phone] ? ? Visit Information ? ? Goals Addressed   ?None ?  ? ?Patient Care Plan: [place]  ?  ? ?Problem Identified: Problem: Hypertension, Hyperlipidemia, Diabetes, GERD, Allergic Rhinitis and insomnia, bipolar disorder   ?  ? ?Long-Range Goal: Patient-Specific Goal   ?Start Date: 11/11/2020  ?Expected End Date: 11/11/2021  ?Recent Progress: On track  ?Priority: High  ?Note:   ?Current Barriers:  ?Unable to independently monitor therapeutic efficacy ?Unable to achieve control of triglycerides  ?Does not contact provider office for questions/concerns ? ?Pharmacist Clinical Goal(s):  ?Patient will achieve adherence to monitoring guidelines and medication adherence to achieve therapeutic efficacy through collaboration with PharmD and provider.  ? ? ?Interventions: ?1:1 collaboration with [name], NP regarding development and update of comprehensive plan of care as evidenced by provider attestation and co-signature ?Inter-disciplinary care team collaboration (see longitudinal plan of care) ?Comprehensive medication review performed; medication list updated in electronic medical record ? ?Hypertension (BP goal <140/90) ?-Uncontrolled ?-Current treatment: ?Losartan 50 mg 1 tablet daily - Appropriate, Query effective, Safe, Accessible ?-Medications previously tried: lisinopril (cough) ?-Current home readings: 140s-150s mostly at home; checking once a week; high in office - arm cuff ?-Current dietary habits:  tries to avoid using salt with foods; drinks 1-2 cups of coffee in the morning ?-Current exercise habits:  patient does not do any structured exercise due to  shoulder pain and double knee replacements; patient can walk and would like to start being more active ?-Denies hypotensive/hypertensive symptoms ?-Educated on BP goals and benefits of medications for prevention of heart attack, stroke and kidney damage; ?Exercise goal of 150 minutes per week; ?Importance of home blood pressure monitoring; ?-Counseled to monitor BP at home weekly, document, and provide log at future appointments ?-Counseled on diet and exercise extensively ?Recommended to continue current medication ?Recommended setting a goal for exercise he is able to accomplish such as 15 minutes 2-3 days a week ?Recommended bringing home BP monitor to next office visit ? ?Hyperlipidemia: (LDL goal < 100) ?-Controlled; Uncontrolled triglycerides ?-Current treatment: ?Ezetimibe 10 mg 1 tablet daily - Appropriate, Query effective, Safe, Accessible ?Fish oil 1000 mg capsule twice daily - Appropriate, Query effective, Safe, Accessible ?Fenofibrate 160 mg 1 tablet daily - Appropriate, Query effective, Safe, Accessible ?-Medications previously tried: statins  ?-Current dietary patterns: tries to avoid fatty foods; eating more sweets lately and does not drink alcohol ?-Current exercise habits: patient does not do any structured exercise due to shoulder pain and double knee replacements; patient can walk and would like to start being more active ?-Educated on Cholesterol goals;  ?Importance of limiting foods high in cholesterol; ?Exercise goal of 150 minutes per week; ?-Recommended to continue current medication ?Recommended Vascepa to replace fish oil for further triglyceride lowering. ? ?Pre-diabetes (A1c goal <6.5%) ?-Controlled ?-Current medications: ?No medications ?-Medications previously tried: none  ?-Current home glucose readings ?fasting glucose: none ?post prandial glucose: none ?-Denies hypoglycemic/hyperglycemic symptoms ?-Current meal patterns:  ?breakfast: did not discuss  ?lunch: did not discuss  ?dinner:  did not discuss ?snacks: did not discuss ?drinks: did not discuss ?-Current exercise: patient does not do any structured exercise due  to shoulder pain and double knee replacements; patient can walk and would like to start being more active ?-Educated onCarbohydrate counting and/or plate method ?-Counseled to check feet daily and get yearly eye exams ?-Counseled on diet and exercise extensively ?Provided healthy plate handout ? ?GERD (Goal: minimize symptoms of heartburn/acid reflux) ?-Controlled ?-Current treatment  ?Pantoprazole 40mg , 1 tablet twice daily - Appropriate, Effective, Safe, Accessible ?-Medications previously tried: none  ?-Recommended to continue current medication ? ?Bipolar disorder (Goal: minimize symptoms) ?-Controlled ?-Current treatment  ?Aripiprazole ER 400mg , inject 400mg  every twenty-eight days  - Appropriate, Effective, Safe, Accessible ?Citalopram 20mg , 1 tablet once daily - Appropriate, Effective, Safe, Accessible ?-Medications previously tried: unknown  ?-Recommended to continue current medication ? ?Sinus congestion (Goal: minimize symptoms of congestion) ?-Uncontrolled ?-Current treatment  ?Fluticasone nasal spray, 1 spray into both nostrils in the morning and at bedtime  - Appropriate, Effective, Safe, Accessible ?Zyrtec 10 mg daily - Appropriate, Effective, Safe, Accessible ?-Medications previously tried: chlopheniramine (switched to safer alternative Zyrtec) ?-Recommended to continue current medication ? ?Insomnia (Goal: improve quality and quantity of sleep) ?-Controlled ?-Current treatment  ?Temazepam 30mg , 1 capsule at bedtime - Appropriate, Effective, Query Safe, Accessible ?-Medications previously tried: several medications, trazodone (ineffective) ?-Recommended to continue current medication ?Counseled on practicing good sleep hygiene by setting a sleep schedule and maintaining it, avoid excessive napping, following a nightly routine, avoiding screen time for 30-60 minutes  before going to bed, and making the bedroom a cool, quiet and dark space  ?Recommended trial of melatonin timed release to help with staying asleep ? ?Health Maintenance ?-Vaccine gaps: none ?-Current therapy:  ?Multivitamin, 1 tablet once daily ?Preservision AREDS 2, 1 capsule twice daily ?Vitamin D3, 1,000 units once daily  ?-Educated on Cost vs benefit of each product must be carefully weighed by individual consumer ?-Patient is satisfied with current therapy and denies issues ?-Recommended to continue current medication ? ? ?Patient Goals/Self-Care Activities ?Patient will:  ?- take medications as prescribed ?check blood pressure weekly, document, and provide at future appointments ?target a minimum of 150 minutes of moderate intensity exercise weekly ? ?Follow Up Plan: Telephone follow up appointment with care management team member scheduled for: 4 months ? ?  ?  ? ?Patient verbalizes understanding of instructions and care plan provided today and agrees to view in [place]. Active MyChart status confirmed with patient.   ?The pharmacy team will reach out to the patient again over the next 7 days.  ? ?Viona Gilmore, RPH  ?

## 2021-11-17 NOTE — Progress Notes (Signed)
Chronic Care Management Pharmacy Note  11/17/2021 Name:  Sean Hill MRN:  [id number] DOB:  06-28-1941  Summary: TGs not at goal < 150 BP is not at goal < 140/90  Recommendations/Changes made from today's visit: -Recommend repeat lipid panel to recheck triglycerides -Recommend Vascepa to replace fish oil and applied for [employer] -Recommended checking BP twice a week -Consider increasing losartan based on recent elevated BP office readings -Recommend stopping Afrin as this elevates BP  Plan: Follow up BP assessment in 2 weeks Follow up in 4 months  Subjective: Sean Hill is an 81 y.o. year old male who is a primary patient of [name], NP.  The CCM team was consulted for assistance with disease management and care coordination needs.    Engaged with patient by telephone for follow up visit in response to provider referral for pharmacy case management and/or care coordination services.   Consent to Services:  The patient was given information about Chronic Care Management services, agreed to services, and gave verbal consent prior to initiation of services.  Please see initial visit note for detailed documentation.   Patient Care Team: [name], NP as PCP - General (Family Medicine) Leanora Cover, MD as Consulting Physician (Orthopedic Surgery) Viona Gilmore, [name] as Pharmacist (Pharmacist)  Recent office visits: 10/26/21 [name], NP - Patient presented for Acute non recurrent pansinusitis and other concerns. Prescribed Doxycycline Hyclate and Tamsulosin. Stopped Cetrizine.   09/23/21 [name], LPN - Patient presented for Medicare Annual wellness exam. Stopped Doxycycline, Hydrocodone, Meclizine, Methocarbamol, Oxycodone and Prednisone.   09/03/21 [name], NP - Patient presented for Moderate persistent asthma with exacerbation. Prescribed Hydrocodone & Ipratropium - Albuterol  07/02/21 [name], NP: Patient presented  for cough and sore throat. Prescribed doxycycline and prednisone.   06/04/21 [name], NP: Patient presented for ED visit follow up after fracture of humerus.  Recent consult visits: 10/28/21 Berniece Andreas - Patient presented for Generalized anxiety disorder. No other visit details available.   10/05/21 Englehorn, Everlean Cherry, LCSW  - Patient presented for Depression. No medication changes noted.   08/19/21 Englehorn, Everlean Cherry, LCSW  - Patient presented for Depression. No medication changes noted.   08/10/21 Herbst, Daleen Snook (PT) - Patient presented for Pain in left shoulder. No other visit details available.   08/06/21 Poff, Ladell Pier (PT) - Patient presented for Pain in left shoulder. No other visit details available.   08/05/21 Berniece Andreas - Patient presented for Bipolar disorder. No other visit details available. 07/02/21 Nehemiah Massed, PT (emergeortho): Patient presented for shoulder pain. Unable to access notes.  06/22/21 Gracelyn Nurse, LCSW (psychiatry): Patient presented for depression and anxiety follow up. Unable to access notes.  06/22/21 Alison Murray, LPN (behavioral health): Patient presented for aripiprazole injection.  06/16/21 Esmond Plants (ortho): Patient presented for shoulder pain and fracture of humerus. Unable to access notes.  06/08/21 Gracelyn Nurse, LCSW (psychiatry): Patient presented for depression and anxiety follow up. Unable to access notes.  Hospital visits: 11/11/20 Patient admitted to [facility] for 3 hours for a colonoscopy for polyp surveillance.   Medication Reconciliation was completed by comparing discharge summary, patients EMR and Pharmacy list, and upon discussion with patient.   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 Patient was present for 30 hours.   New?Medications Started at [facility] Discharge:?? -started  aspirin methocarbamol (ROBAXIN) oxyCODONE  (Oxy IR/ROXICODONE)   Medication Changes at Hospital Discharge: -  Changed    Medications Discontinued at Hospital Discharge: -Stopped  HYDROcodone bit-homatropine 5-1.5 MG/5ML syrup (HYCODAN) predniSONE 20 MG tablet (DELTASONE)   Medications that remain the same after Hospital Discharge:??  -All other medications will remain the same.    Objective:  Lab Results  Component Value Date   CREATININE 0.98 08/26/2021   BUN 17 08/26/2021   GFR 83.60 04/29/2021   GFRNONAA >60 08/26/2021   GFRAA >60 10/09/2019   NA 139 08/26/2021   K 4.2 08/26/2021   CALCIUM 8.8 (L) 08/26/2021   CO2 23 08/26/2021    Lab Results  Component Value Date/Time   HGBA1C 6.0 (H) 08/16/2021 09:40 AM   HGBA1C 6.0 04/29/2021 11:06 AM   GFR 83.60 04/29/2021 11:06 AM   GFR 59.27 (L) 12/25/2019 08:23 AM   MICROALBUR 1.5 11/28/2011 04:28 PM    Last diabetic Eye exam: No results found for: HMDIABEYEEXA  Last diabetic Foot exam: No results found for: HMDIABFOOTEX   Lab Results  Component Value Date   CHOL 195 07/26/2021   HDL 34.50 (L) 07/26/2021   LDLDIRECT 112.0 07/26/2021   TRIG 354.0 (H) 07/26/2021   CHOLHDL 6 07/26/2021    Hepatic Function Latest Ref Rng & Units 08/16/2021 04/29/2021 08/07/2020  Total Protein 6.5 - 8.1 g/dL 6.8 6.6 7.2  Albumin 3.5 - 5.0 g/dL 4.1 4.1 3.9  AST 15 - 41 U/L 43(H) 35 29  ALT 0 - 44 U/L _0 Alk Phosphatase 38 - 126 U/L 71 91 63  Total Bilirubin 0.3 - 1.2 mg/dL 0.8 0.7 1.0  Bilirubin, Direct 0.0 - 0.3 mg/dL - - -    Lab Results  Component Value Date/Time   TSH 1.90 04/29/2021 11:06 AM   TSH 2.30 12/25/2019 08:23 AM   FREET4 0.89 07/21/2011 06:52 PM    CBC Latest Ref Rng & Units 08/26/2021 08/16/2021 05/23/2021  WBC 4.0 - 10.5 K/uL 12.7(H) 4.4 7.2  Hemoglobin 13.0 - 17.0 g/dL 13.7 14.9 15.4  Hematocrit 39.0 - 52.0 % 41.2 44.4 44.7  Platelets 150 - 400 K/uL 219 233 215    No results found for: VD25OH  Clinical ASCVD: No  The 10-year ASCVD risk score  (Arnett DK, et al., 2019) is: 53.9%   Values used to calculate the score:     Age: 48 years     Sex: Male     Is Non-Hispanic African American: No     Diabetic: No     Tobacco smoker: No     Systolic Blood Pressure: 160 mmHg     Is BP treated: Yes     HDL Cholesterol: 34.5 mg/dL     Total Cholesterol: 195 mg/dL    Depression screen [facility] 2/9 09/23/2021 09/25/2020 11/07/2018  Decreased Interest 0 0 0  Down, Depressed, Hopeless 0 0 0  PHQ - 2 Score 0 0 0  Some recent data might be hidden      Social History   Tobacco Use  Smoking Status Never  Smokeless Tobacco Never   BP Readings from Last 3 Encounters:  11/17/21 (!) 173/71  11/11/21 (!) 143/67  10/26/21 110/70   Pulse Readings from Last 3 Encounters:  11/17/21 86  11/11/21 70  10/26/21 74   Wt Readings from Last 3 Encounters:  11/17/21 269 lb 9.6 oz (122.3 kg)  11/11/21 260 lb (117.9 kg)  10/28/21 262 lb (118.8 kg)    Assessment/Interventions: Review of patient past medical history, allergies, medications, health status, including review of consultants reports,  laboratory and other test data, was performed as part of comprehensive evaluation and provision of chronic care management services.   SDOH:  (Social Determinants of Health) assessments and interventions performed: No   CCM Care Plan  Allergies  Allergen Reactions   Penicillins Other (See Comments)    BLISTERS Did it involve swelling of the face/tongue/throat, SOB, or low BP? No Did it involve sudden or severe rash/hives, skin peeling, or any reaction on the inside of your mouth or nose? No Did you need to seek medical attention at a hospital or doctor's office? No When did it last happen? around 1980    If all above answers are "NO", may proceed with cephalosporin use. Tolerated Cephalosporin Date: 08/26/21.     Lisinopril Diarrhea and Cough    Dry cough, abdominal bloating and diarrhea   Codeine Rash    Medications Reviewed Today     Reviewed by  Cynda Familia, CRNA (Certified Registered Nurse Anesthetist) on 11/11/21 at [phone]  Med List Status: Complete   Medication Order Taking? Sig Documenting Provider Last Dose Status Informant  albuterol (VENTOLIN HFA) 108 (90 Base) MCG/ACT inhaler [id number] No Inhale 2 puffs into the lungs every 6 (six) hours as needed for wheezing or shortness of breath. Nafziger, Tommi Rumps, NP More than a month Active Self  ARIPiprazole ER (ABILIFY MAINTENA) 400 MG prefilled syringe 400 mg [id number]    Patient taking differently: Inject 400 mg into the muscle every 28 (twenty-eight) days. Reconstitute with the attached diluent before use.   [name], MD  Active   ARIPiprazole ER (ABILIFY MAINTENA) 400 MG PRSY prefilled syringe [id number] Yes Inject 400 mg into the muscle every 28 (twenty-eight) days. [name], MD Past Month Active Self  benztropine (COGENTIN) 0.5 MG tablet [id number] Yes Take 1 tablet (0.5 mg total) by mouth daily. [name], MD 11/10/2021 Active Self  citalopram (CELEXA) 20 MG tablet [id number] Yes Take 1 tablet (20 mg total) by mouth daily. [name], MD 11/10/2021 Active Self  doxycycline (VIBRAMYCIN) 100 MG capsule [id number] Yes Take 1 capsule (100 mg total) by mouth 2 (two) times daily. [name], NP Past Week Active Self           Med Note Corky Mull   Fri Nov 05, 2021  2:10 PM) Roma Kayser 11-05-21  ezetimibe (ZETIA) 10 MG tablet [id number] Yes TAKE ONE TABLET BY MOUTH AT NOON  Patient taking differently: Take 10 mg by mouth daily.   [name], NP 11/10/2021 Active Self  fenofibrate (TRICOR) 145 MG tablet [id number] Yes Take 1 tablet (145 mg total) by mouth daily. [name], NP 11/10/2021 Active Self  ipratropium-albuterol (DUONEB) 0.5-2.5 (3) MG/3ML SOLN [id number] Yes Take 3 mLs by nebulization every 6 (six) hours as needed.  Patient taking differently: Take 3 mLs by nebulization every 6 (six) hours as needed (shortness of breath).   [name], NP  11/10/2021 Active Self  losartan (COZAAR) 50 MG tablet [id number] Yes TAKE ONE TABLET BY MOUTH EVERY MORNING  Patient taking differently: Take 50 mg by mouth daily.   [name], NP 11/10/2021 Active Self  Multiple Vitamin (MULTIVITAMIN WITH MINERALS) TABS tablet [id number] Yes Take 1 tablet by mouth daily. [provider] 11/10/2021 Active Self  Multiple Vitamins-Minerals (PRESERVISION AREDS 2 PO) [id number] Yes Take 1 capsule by mouth 2 (two) times daily. [provider] 11/10/2021 Active Self  naproxen sodium (ALEVE) 220 MG tablet [id number] Yes Take 440 mg by mouth daily as needed (pain).  [provider] Past Month Active Self  Omega-3 Fatty Acids (FISH OIL) 1000 MG CAPS [id number] Yes Take 1,000 mg by mouth in the morning and at bedtime. [provider] 11/10/2021 Active Self  oxymetazoline (AFRIN) 0.05 % nasal spray [id number] Yes Place 1 spray into both nostrils 2 (two) times daily as needed for congestion. [provider]  Active Self  pantoprazole (PROTONIX) 40 MG tablet [id number] Yes TAKE ONE TABLET BY MOUTH TWICE DAILY  Patient taking differently: 40 mg 2 (two) times daily.   Gerrit Heck V, DO 11/10/2021 Active Self  tamsulosin (FLOMAX) 0.4 MG CAPS capsule [id number] Yes Take 1 capsule (0.4 mg total) by mouth daily. [name], NP 11/10/2021 Active Self  temazepam (RESTORIL) 30 MG capsule [id number] Yes Take 1 capsule (30 mg total) by mouth at bedtime. [name], MD 11/10/2021 Active Self  Vitamin D, Cholecalciferol, 1000 UNITS TABS [id number] Yes Take 1,000 Units by mouth daily.  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   Bipolar affective disorder, currently depressed, mild ([place])  10/21/2019   Hemorrhage of colon following colonoscopy 10/08/2019   Adenomatous polyp of ascending colon    Tubulovillous adenoma of colon    History of colonic polyps    Diverticulosis of colon without hemorrhage    Grade I internal hemorrhoids    Essential hypertension 08/23/2016   Left bundle branch block (LBBB) on electrocardiogram 06/10/2016   Hyperlipidemia 06/07/2016   Left leg swelling 01/06/2015   Change in bowel habits 01/06/2015   Gastroesophageal reflux disease 11/28/2011   Bipolar disorder ([place]) 11/25/2011    Immunization History  Administered Date(s) Administered   Fluad Quad(high Dose 65+) 05/29/2019, 06/04/2021   Influenza Inj Mdck Quad Pf 05/29/2019   Influenza, High Dose Seasonal PF 06/07/2016, 05/15/2017   Influenza-Unspecified 07/20/2013, 06/16/2020   PFIZER Comirnaty(Gray Top)Covid-19 Tri-Sucrose Vaccine 01/29/2021   PFIZER(Purple Top)SARS-COV-2 Vaccination 10/25/2019, 11/19/2019, 06/16/2020   Pfizer Covid-19 Vaccine Bivalent Booster 78yr & up 06/09/2021   Pneumococcal Conjugate-13 03/11/2015   Pneumococcal Polysaccharide-23 07/06/2010   Tdap 07/06/2010, 10/29/2015   Zoster Recombinat (Shingrix) 01/29/2021, 05/18/2021   He isn't doing PT right now as they discharged him and he needs to do a lot of exercise. He hasn't been doing as much as he needs to. He has been doing some walking. His wife goes with him with walking.  Patient feels like his BP medication isn't working as well as it once did. He hasn't been in more pain at all lately. Patient probably has been eating out more often and may be eating more salty foods. He never salts his food but when he eats out it's usually salty. Patient reports he can cut back on eating out. When his blood pressure was checked today it was high but they checked it right when he walked in the door.   Conditions to be addressed/monitored:  Hypertension, Hyperlipidemia, Diabetes, GERD, Allergic Rhinitis and insomnia, bipolar  disorder  Conditions addressed this visit: Hypertension, hyperlipidemia  Care Plan : [place] Updates made by PViona Gilmore RTrentonsince 11/17/2021 12:00 AM     Problem: Problem: Hypertension, Hyperlipidemia, Diabetes, GERD, Allergic Rhinitis and insomnia, bipolar disorder  Long-Range Goal: Patient-Specific Goal   Start Date: 11/11/2020  Expected End Date: 11/11/2021  Recent Progress: On track  Priority: High  Note:   Current Barriers:  Unable to independently monitor therapeutic efficacy Unable to achieve control of triglycerides  Does not contact provider office for questions/concerns  Pharmacist Clinical Goal(s):  Patient will achieve adherence to monitoring guidelines and medication adherence to achieve therapeutic efficacy through collaboration with PharmD and provider.    Interventions: 1:1 collaboration with [name], NP regarding development and update of comprehensive plan of care as evidenced by provider attestation and co-signature Inter-disciplinary care team collaboration (see longitudinal plan of care) Comprehensive medication review performed; medication list updated in electronic medical record  Hypertension (BP goal <140/90) -Uncontrolled -Current treatment: Losartan 50 mg 1 tablet daily - Appropriate, Query effective, Safe, Accessible -Medications previously tried: lisinopril (cough) -Current home readings: 140s-150s mostly at home; checking once a week; high in office - arm cuff -Current dietary habits:  tries to avoid using salt with foods; drinks 1-2 cups of coffee in the morning -Current exercise habits:  patient does not do any structured exercise due to shoulder pain and double knee replacements; patient can walk and would like to start being more active -Denies hypotensive/hypertensive symptoms -Educated on BP goals and benefits of medications for prevention of heart attack, stroke and kidney damage; Exercise goal of 150  minutes per week; Importance of home blood pressure monitoring; -Counseled to monitor BP at home weekly, document, and provide log at future appointments -Counseled on diet and exercise extensively Recommended to continue current medication Recommended setting a goal for exercise he is able to accomplish such as 15 minutes 2-3 days a week Recommended bringing home BP monitor to next office visit  Hyperlipidemia: (LDL goal < 100) -Controlled; Uncontrolled triglycerides -Current treatment: Ezetimibe 10 mg 1 tablet daily - Appropriate, Query effective, Safe, Accessible Fish oil 1000 mg capsule twice daily - Appropriate, Query effective, Safe, Accessible Fenofibrate 160 mg 1 tablet daily - Appropriate, Query effective, Safe, Accessible -Medications previously tried: statins  -Current dietary patterns: tries to avoid fatty foods; eating more sweets lately and does not drink alcohol -Current exercise habits: patient does not do any structured exercise due to shoulder pain and double knee replacements; patient can walk and would like to start being more active -Educated on Cholesterol goals;  Importance of limiting foods high in cholesterol; Exercise goal of 150 minutes per week; -Recommended to continue current medication Recommended Vascepa to replace fish oil for further triglyceride lowering.  Pre-diabetes (A1c goal <6.5%) -Controlled -Current medications: No medications -Medications previously tried: none  -Current home glucose readings fasting glucose: none post prandial glucose: none -Denies hypoglycemic/hyperglycemic symptoms -Current meal patterns:  breakfast: did not discuss  lunch: did not discuss  dinner: did not discuss snacks: did not discuss drinks: did not discuss -Current exercise: patient does not do any structured exercise due to shoulder pain and double knee replacements; patient can walk and would like to start being more active -Educated onCarbohydrate counting  and/or plate method -Counseled to check feet daily and get yearly eye exams -Counseled on diet and exercise extensively Provided healthy plate handout  GERD (Goal: minimize symptoms of heartburn/acid reflux) -Controlled -Current treatment  Pantoprazole 61m, 1 tablet twice daily - Appropriate, Effective, Safe, Accessible -Medications previously tried: none  -Recommended to continue current medication  Bipolar disorder (Goal: minimize symptoms) -Controlled -Current treatment  Aripiprazole ER 4013m inject 40065mvery twenty-eight days  - Appropriate, Effective, Safe, Accessible Citalopram 65m59m  1 tablet once daily - Appropriate, Effective, Safe, Accessible -Medications previously tried: unknown  -Recommended to continue current medication  Sinus congestion (Goal: minimize symptoms of congestion) -Uncontrolled -Current treatment  Fluticasone nasal spray, 1 spray into both nostrils in the morning and at bedtime  - Appropriate, Effective, Safe, Accessible Zyrtec 10 mg daily - Appropriate, Effective, Safe, Accessible -Medications previously tried: chlopheniramine (switched to safer alternative Zyrtec) -Recommended to continue current medication  Insomnia (Goal: improve quality and quantity of sleep) -Controlled -Current treatment  Temazepam 49m, 1 capsule at bedtime - Appropriate, Effective, Query Safe, Accessible -Medications previously tried: several medications, trazodone (ineffective) -Recommended to continue current medication Counseled on practicing good sleep hygiene by setting a sleep schedule and maintaining it, avoid excessive napping, following a nightly routine, avoiding screen time for 30-60 minutes before going to bed, and making the bedroom a cool, quiet and dark space  Recommended trial of melatonin timed release to help with staying asleep  Health Maintenance -Vaccine gaps: none -Current therapy:  Multivitamin, 1 tablet once daily Preservision AREDS 2, 1 capsule  twice daily Vitamin D3, 1,000 units once daily  -Educated on Cost vs benefit of each product must be carefully weighed by individual consumer -Patient is satisfied with current therapy and denies issues -Recommended to continue current medication   Patient Goals/Self-Care Activities Patient will:  - take medications as prescribed check blood pressure weekly, document, and provide at future appointments target a minimum of 150 minutes of moderate intensity exercise weekly  Follow Up Plan: Telephone follow up appointment with care management team member scheduled for: 4 months      Medication Assistance: None required.  Patient affirms current coverage meets needs.  Compliance/Adherence/Medication fill history: Care Gaps: BP- 143/67 (11/11/21)  Star-Rating Drugs: Losartan (Cozaar) 50 mg - Last filled 11/05/21 90 DS at [facility]  Patient's preferred pharmacy is:  [facility] [place] [address] [address] [address] [id number]: [phone]: [phone]  Uses pill box? No - adherence packaging Pt endorses 100% compliance  We discussed: Benefits of medication synchronization, packaging and delivery as well as enhanced pharmacist oversight with [facility]. Patient decided to: Utilize [facility] pharmacy for medication synchronization, packaging and delivery  Care Plan and Follow Up Patient Decision:  Patient agrees to Care Plan and Follow-up.  Plan: The care management team will reach out to the patient again over the next 30 days.  MJeni Salles PharmD BHolzer Medical CenterClinical Pharmacist LMarinaat [place]

## 2021-11-30 ENCOUNTER — Other Ambulatory Visit: Payer: Self-pay | Admitting: Adult Health

## 2021-11-30 MED ORDER — AZELASTINE HCL 0.1 % NA SOLN: 2.0000 | Freq: Two times a day (BID) | NASAL | 6 refills | Status: DC

## 2021-12-17 DIAGNOSIS — E7849 Other hyperlipidemia: Secondary | ICD-10-CM | POA: Diagnosis not present

## 2021-12-17 DIAGNOSIS — I10 Essential (primary) hypertension: Secondary | ICD-10-CM | POA: Diagnosis not present

## 2021-12-21 ENCOUNTER — Ambulatory Visit (INDEPENDENT_AMBULATORY_CARE_PROVIDER_SITE_OTHER): Payer: PPO | Admitting: Adult Health

## 2021-12-21 ENCOUNTER — Ambulatory Visit (INDEPENDENT_AMBULATORY_CARE_PROVIDER_SITE_OTHER): Payer: PPO

## 2021-12-21 ENCOUNTER — Ambulatory Visit (HOSPITAL_BASED_OUTPATIENT_CLINIC_OR_DEPARTMENT_OTHER): Payer: PPO | Admitting: *Deleted

## 2021-12-21 ENCOUNTER — Encounter (HOSPITAL_COMMUNITY): Payer: Self-pay | Admitting: *Deleted

## 2021-12-21 ENCOUNTER — Encounter: Payer: Self-pay | Admitting: Adult Health

## 2021-12-21 VITALS — BP 120/64 | HR 72 | Temp 97.7°F | Ht 66.0 in | Wt 268.0 lb

## 2021-12-21 VITALS — BP 126/68 | HR 71 | Resp 20 | Ht 66.0 in | Wt 267.8 lb

## 2021-12-21 DIAGNOSIS — M25561 Pain in right knee: Secondary | ICD-10-CM

## 2021-12-21 DIAGNOSIS — G8929 Other chronic pain: Secondary | ICD-10-CM

## 2021-12-21 DIAGNOSIS — R600 Localized edema: Secondary | ICD-10-CM

## 2021-12-21 DIAGNOSIS — E7849 Other hyperlipidemia: Secondary | ICD-10-CM

## 2021-12-21 DIAGNOSIS — F319 Bipolar disorder, unspecified: Secondary | ICD-10-CM

## 2021-12-21 LAB — BASIC METABOLIC PANEL
BUN: 17 mg/dL (ref 6–23)
CO2: 28 mEq/L (ref 19–32)
Calcium: 9.9 mg/dL (ref 8.4–10.5)
Chloride: 106 mEq/L (ref 96–112)
Creatinine, Ser: 1.21 mg/dL (ref 0.40–1.50)
GFR: 57.14 mL/min — ABNORMAL LOW (ref >60.00)
Glucose, Bld: 107 mg/dL — ABNORMAL HIGH (ref 70–99)
Potassium: 3.9 mEq/L (ref 3.5–5.1)
Sodium: 141 mEq/L (ref 135–145)

## 2021-12-21 LAB — LIPID PANEL
Cholesterol: 156 mg/dL (ref 0–200)
HDL: 30.4 mg/dL — ABNORMAL LOW (ref >39.00)
NonHDL: 125.66
Total CHOL/HDL Ratio: 5
Triglycerides: 227 mg/dL — ABNORMAL HIGH (ref 0.0–149.0)
VLDL: 45.4 mg/dL — ABNORMAL HIGH (ref 0.0–40.0)

## 2021-12-21 LAB — LDL CHOLESTEROL, DIRECT: Direct LDL: 91 mg/dL

## 2021-12-21 NOTE — Patient Instructions (Signed)
Pt presents today for due Abilify Maintena 400 mg injection. Pt pleasant and cooperative on approach. Pt has some orthopedic issues but otherwise says he's feeling fine. Mood stable. Sleep good. Injection prepared and given as ordered in [facility] without any complaints. Pt encouraged to call office with any questions or concerns prior to next appointment. Pt agrees. ?

## 2021-12-21 NOTE — Progress Notes (Signed)
? ?Subjective:  ? ? Patient ID: Sean Hill, male    DOB: Aug 30, 1941, 81 y.o.   MRN: [id number] ? ?HPI ?81 year old male who  has a past medical history of Anal fissure, Anxiety, Arthritis, Asthma, Bipolar disorder ([place]), Cataract, Depression, Difficult airway for intubation, GERD (gastroesophageal reflux disease), Hemorrhage of colon following colonoscopy (10/08/2019), History of kidney stones, History of MRSA infection, Hyperlipidemia, Hypertension, Impaired hearing, Left bundle branch block (LBBB) on electrocardiogram (10/29/2015), Macular degeneration (senile) of retina, Pre-diabetes, and Wears glasses. ? ?He presents to the office today for 6 month follow up regarding hyperlipidemia. He is currently managed with Zetia 10 mg daily, Tricor 145 mg daily and Vascepa 1 gm BID.  ? ?Since starting Vascepa she has developed some lower extremity edema bilaterally that becomes worse as the day goes on. Swelling is from top of the feet to mid shin  ? ?Lab Results  ?Component Value Date  ? CHOL 195 07/26/2021  ? HDL 34.50 (L) 07/26/2021  ? LDLDIRECT 112.0 07/26/2021  ? TRIG 354.0 (H) 07/26/2021  ? CHOLHDL 6 07/26/2021  ? ?Additionally, he reports right knee pain on the outside of his knee. He had this knee replaced in December 2022. Feels like something is poking him  ? ? ?Review of Systems ?See HPI  ? ?Past Medical History:  ?Diagnosis Date  ? Anal fissure   ? Anxiety   ? Arthritis   ? right wrist; pt. states "everywhere"  ? Asthma   ? Bipolar disorder ([employer])   ? Cataract   ? Depression   ? Difficult airway for intubation   ? GERD (gastroesophageal reflux disease)   ? Hemorrhage of colon following colonoscopy 10/08/2019  ? History of kidney stones   ? History of MRSA infection   ? left knee after arthroplasty  ? Hyperlipidemia   ? Hypertension   ? states under control with med., has been on med. x 1 yr.  ? Impaired hearing   ? Left bundle branch block (LBBB) on electrocardiogram 10/29/2015  ? Macular degeneration  (senile) of retina   ? left  ? Pre-diabetes   ? Wears glasses   ? ? ?Social History  ? ?Socioeconomic History  ? Marital status: Married  ?  Spouse name: Not on file  ? Number of children: Not on file  ? Years of education: Not on file  ? Highest education level: Not on file  ?Occupational History  ? Not on file  ?Tobacco Use  ? Smoking status: Never  ? Smokeless tobacco: Never  ?Vaping Use  ? Vaping Use: Never used  ?Substance and Sexual Activity  ? Alcohol use: Not Currently  ?  Comment: occasional.  ? Drug use: No  ? Sexual activity: Not Currently  ?  Birth control/protection: None  ?Other Topics Concern  ? Not on file  ?Social History Narrative  ? Married, lives in [place].  Retired Dance movement psychotherapist.  ? Two daughters, both married,3 grandchildren ( 21, 28, 1 year)   ? No regular exercise.  Never smoker.  No ETOH.  No drugs.  ? ?Social Determinants of Health  ? ?Financial Resource Strain: Low Risk   ? Difficulty of Paying Living Expenses: Not hard at all  ?Food Insecurity: No Food Insecurity  ? Worried About Charity fundraiser in the Last Year: Never true  ? Ran Out of Food in the Last Year: Never true  ?Transportation Needs: No Transportation Needs  ? Lack of Transportation (Medical): No  ?  Lack of Transportation (Non-Medical): No  ?Physical Activity: Inactive  ? Days of Exercise per Week: 0 days  ? Minutes of Exercise per Session: 0 min  ?Stress: No Stress Concern Present  ? Feeling of Stress : Not at all  ?Social Connections: Moderately Isolated  ? Frequency of Communication with Friends and Family: More than three times a week  ? Frequency of Social Gatherings with Friends and Family: More than three times a week  ? Attends Religious Services: Never  ? Active Member of Clubs or Organizations: No  ? Attends Archivist Meetings: Never  ? Marital Status: Married  ?Intimate Partner Violence: Not At Risk  ? Fear of Current or Ex-Partner: No  ? Emotionally Abused: No  ? Physically Abused: No  ? Sexually  Abused: No  ? ? ?Past Surgical History:  ?Procedure Laterality Date  ? ANKLE FUSION Bilateral   ? ANKLE SURGERY Bilateral   ? ligament surgery  ? BIOPSY  04/29/2020  ? Procedure: BIOPSY;  Surgeon: Lavena Bullion, DO;  Location: [facility];  Service: Gastroenterology;;  ? CARDIAC CATHETERIZATION  x 2  ? 1983; 11/14/2003  ? CARPOMETACARPEL SUSPENSION PLASTY Right 09/22/2016  ? Procedure: right thumb carpometacarpal  ARTHROPLASTY;  Surgeon: Leanora Cover, MD;  Location: [facility];  Service: Orthopedics;  Laterality: Right;  right thumb carpometacarpal  ARTHROPLASTY  ? CARPOMETACARPEL SUSPENSION PLASTY Right 11/02/2017  ? Procedure: RIGHT THUMB SUSPENSIONPLASTY WITH TIGHTROPE, DISTAL POLE SCAPHOID  EXCISION;  Surgeon: Leanora Cover, MD;  Location: [facility];  Service: Orthopedics;  Laterality: Right;  ? COLONOSCOPY WITH PROPOFOL N/A 10/02/2019  ? Procedure: COLONOSCOPY WITH PROPOFOL;  Surgeon: Lavena Bullion, DO;  Location: [facility];  Service: Gastroenterology;  Laterality: N/A;  ? COLONOSCOPY WITH PROPOFOL N/A 10/09/2019  ? Procedure: COLONOSCOPY WITH PROPOFOL;  Surgeon: Gatha Mayer, MD;  Location: [facility];  Service: Endoscopy;  Laterality: N/A;  ? COLONOSCOPY WITH PROPOFOL N/A 04/29/2020  ? Procedure: COLONOSCOPY WITH PROPOFOL;  Surgeon: Lavena Bullion, DO;  [facility]: [facility];  Service: Gastroenterology;  Laterality: N/A;  ? COLONOSCOPY WITH PROPOFOL N/A 11/11/2021  ? Procedure: COLONOSCOPY WITH PROPOFOL;  Surgeon: Lavena Bullion, DO;  [facility];  Service: Gastroenterology;  Laterality: N/A;  ? ELBOW SURGERY Left   ? ENDOSCOPIC MUCOSAL RESECTION N/A 10/02/2019  ? Procedure: ENDOSCOPIC MUCOSAL RESECTION;  Surgeon: Lavena Bullion, DO;  [facility];  Service: Gastroenterology;  Laterality: N/A;  ? HEMOSTASIS CLIP PLACEMENT  10/09/2019  ? Procedure: HEMOSTASIS CLIP PLACEMENT;  Surgeon: Gatha Mayer, MD;  [facility];   Service: Endoscopy;;  ? HEMOSTASIS CLIP PLACEMENT  11/11/2021  ? Procedure: HEMOSTASIS CLIP PLACEMENT;  Surgeon: Lavena Bullion, DO;  [facility];  Service: Gastroenterology;;  ? INGUINAL HERNIA REPAIR Right   ? KNEE SURGERY    ? OPEN REDUCTION INTERNAL FIXATION (ORIF) DISTAL RADIAL FRACTURE Right 10/29/2015  ? Procedure: OPEN REDUCTION INTERNAL FIXATION (ORIF) DISTAL RADIAL FRACTURE;  Surgeon: Leanora Cover, MD;  Location: [facility];  Service: Orthopedics;  Laterality: Right;  ? ORIF ELBOW FRACTURE Right   ? POLYPECTOMY  04/29/2020  ? Procedure: POLYPECTOMY;  Surgeon: Lavena Bullion, DO;  [facility];  Service: Gastroenterology;;  ? POLYPECTOMY  11/11/2021  ? Procedure: POLYPECTOMY;  Surgeon: Lavena Bullion, DO;  [facility];  Service: Gastroenterology;;  ? SUBMUCOSAL LIFTING INJECTION  10/02/2019  ? Procedure: SUBMUCOSAL LIFTING INJECTION;  Surgeon: Lavena Bullion, DO;  Location:  [facility];  Service: Gastroenterology;;  ? TONSILLECTOMY    ? TOTAL KNEE ARTHROPLASTY Bilateral   ? TOTAL KNEE REVISION Right 08/25/2021  ? Procedure: Right knee polyethylene vs total knee arthroplasty revision;  Surgeon: Gaynelle Arabian, MD;  [facility];  Service: Orthopedics;  Laterality: Right;  ? ULNAR COLLATERAL LIGAMENT REPAIR Right 12/29/2015  ? Procedure: REPAIR  RIGHT LATERAL ULNAR COLLATERAL LIGAMENT TEAR  EXTENSOR ORIGIN ;  Surgeon: Leanora Cover, MD;  Location: [place];  Service: Orthopedics;  Laterality: Right;  ? WRIST ARTHROSCOPY WITH DEBRIDEMENT Right 07/14/2016  ? Procedure: RIGHT WRIST ARTHROSCOPY WITH DEBRIDEMENT  TRIANGULAR FIBROCARTILAGE COMPLEX;  Surgeon: Leanora Cover, MD;  Location: [place];  Service: Orthopedics;  Laterality: Right;  ? ? ?Family History  ?Problem Relation Age of Onset  ? Alcohol abuse Mother   ? Ovarian cancer Mother   ? Alcohol abuse Father   ? Pancreatic cancer Father   ? Hyperlipidemia Brother    ? Barrett's esophagus Brother   ? Colon cancer Maternal Grandmother   ? Depression Daughter   ? Paranoid behavior Daughter   ? Esophageal cancer Neg Hx   ? Stomach cancer Neg Hx   ? Rectal cancer Neg Hx

## 2021-12-22 ENCOUNTER — Telehealth: Payer: Self-pay | Admitting: Adult Health

## 2021-12-22 DIAGNOSIS — H43812 Vitreous degeneration, left eye: Secondary | ICD-10-CM | POA: Diagnosis not present

## 2021-12-22 DIAGNOSIS — H353232 Exudative age-related macular degeneration, bilateral, with inactive choroidal neovascularization: Secondary | ICD-10-CM | POA: Diagnosis not present

## 2021-12-22 NOTE — Telephone Encounter (Signed)
Updated patient on labs and x-ray.  His triglycerides have increased since starting Vascepa, will decrease his dose to 1 tablet twice a day  see if this helps with his lower extremity edema. ?

## 2022-01-20 ENCOUNTER — Encounter (HOSPITAL_COMMUNITY): Payer: Self-pay | Admitting: *Deleted

## 2022-01-20 ENCOUNTER — Ambulatory Visit (HOSPITAL_BASED_OUTPATIENT_CLINIC_OR_DEPARTMENT_OTHER): Payer: PPO | Admitting: *Deleted

## 2022-01-20 VITALS — BP 118/70 | HR 73 | Resp 20 | Ht 66.0 in | Wt 268.0 lb

## 2022-01-20 DIAGNOSIS — F319 Bipolar disorder, unspecified: Secondary | ICD-10-CM | POA: Diagnosis not present

## 2022-01-20 NOTE — Patient Instructions (Addendum)
Pt presents today for due Abilify Maintena 400 mg injection. Pt appropriate and cooperative on approach. Affect appropriate to mood. Injection prepared as ordered and given in LUOQ  without complaints. Pt to return in approximately 28 days for next due injection. Pt encouraged to call with any questions or concerns prior to next visit. Pt agrees. ?

## 2022-01-27 ENCOUNTER — Encounter (HOSPITAL_COMMUNITY): Payer: Self-pay | Admitting: Psychiatry

## 2022-01-27 ENCOUNTER — Telehealth (HOSPITAL_BASED_OUTPATIENT_CLINIC_OR_DEPARTMENT_OTHER): Payer: PPO | Admitting: Psychiatry

## 2022-01-27 ENCOUNTER — Other Ambulatory Visit (HOSPITAL_COMMUNITY): Payer: Self-pay | Admitting: Psychiatry

## 2022-01-27 DIAGNOSIS — F411 Generalized anxiety disorder: Secondary | ICD-10-CM | POA: Diagnosis not present

## 2022-01-27 DIAGNOSIS — F319 Bipolar disorder, unspecified: Secondary | ICD-10-CM | POA: Diagnosis not present

## 2022-01-27 MED ORDER — CITALOPRAM HYDROBROMIDE 20 MG PO TABS: 20.0000 mg | ORAL_TABLET | Freq: Every day | ORAL | 0 refills | Status: DC

## 2022-01-27 MED ORDER — TEMAZEPAM 30 MG PO CAPS: 30.0000 mg | ORAL_CAPSULE | Freq: Every day | ORAL | 0 refills | Status: DC

## 2022-01-27 MED ORDER — BENZTROPINE MESYLATE 0.5 MG PO TABS: 0.5000 mg | ORAL_TABLET | Freq: Every day | ORAL | 0 refills | Status: DC

## 2022-01-27 MED ORDER — ARIPIPRAZOLE ER 400 MG IM PRSY: 400.0000 mg | PREFILLED_SYRINGE | INTRAMUSCULAR | 2 refills | Status: DC

## 2022-01-27 NOTE — Progress Notes (Signed)
Virtual Visit via Telephone Note ? ?I connected with Sean Hill on 01/27/22 at  3:00 PM EDT by telephone and verified that I am speaking with the correct person using two identifiers. ? ?Location: ?Patient: Home ?Provider: Office ?  ?I discussed the limitations, risks, security and privacy concerns of performing an evaluation and management service by telephone and the availability of in person appointments. I also discussed with the patient that there may be a patient responsible charge related to this service. The patient expressed understanding and agreed to proceed. ? ? ?History of Present Illness: ?Patient is evaluated by phone session.  He is taking all his medication as prescribed.  Denies any mania, psychosis or any hallucination.  He sleeps good.  Recently had a visit with his physician and had blood work.  His triglycerides and lipid panel is improved from the past.  His GFR is slightly low and creatinine is mildly elevated.  He had stopped driving and his wife usually taken to the doctor's appointment.  Denies any highs and lows and does not want to change the medication.  His tremors are manageable by Cogentin.  He regularly comes for Abilify injection.  His appetite is okay.  His weight is stable from the past.  He denies drinking or using any illegal substances. ?  ?  ?Past Psychiatric History:  ?H/O multiple hospitalization.  Last inpatient in July 2014. H/O overdose on Latuda with alcohol. H/O cutting wrist.  Took Tegretol, Depakote, Ambien, Remeron, Vistaril, Geodon, Abilify, lithium, Provigil, Zoloft, Neurontin, Lamictal, Wellbutrin, Risperdal and Pristiq. H/O mania, aggression, getting speeding tickets, excessive buying and impulsive behavior. ? ?Recent Results (from the past 2160 hour(s))  ?Surgical pathology     Status: None  ? Collection Time: 11/11/21 10:06 AM  ?Result Value Ref Range  ? SURGICAL PATHOLOGY    ?  SURGICAL PATHOLOGY ?CASE: [id number] ?PATIENT: Sean  Hill ?Surgical Pathology Report ? ? ? ? ?Clinical History: Hx of colon polyps ([facility]) ? ? ? ? ?FINAL MICROSCOPIC DIAGNOSIS: ? ?A. COLON, TRANSVERSE, POLYPECTOMY: ?    - Tubular adenoma. ?    - No high-grade dysplasia or malignancy. ? ?GROSS DESCRIPTION: ? ?Received in formalin is a tan, soft tissue fragment that is submitted in ?toto.  Size: 1.2 x 0.4 x 0.2 cm, 1 block submitted.  Sean Hill 11/11/2021) ? ? ? ?Final Diagnosis performed by Sean Evangelist, MD.   Electronically ?signed 11/12/2021 ?Technical component performed at [facility], [place] ?[address] [place], [place] [address]. ? Professional component performed at [facility]. [place] [address], [place], [place] [address]. ? Immunohistochemistry Technical component (if applicable) was performed ?at [facility]. [address], [address], ?[place], [place] [address].   IMMUNOHISTOCHEMISTRY DISCLAIMER (if applicable): ?Some of  these immunohistochemical stains may have been developed and the ?performance characteristics determine by [facility]. Some ?may not have been cleared or approved by the U.S. Food and Drug ?Administration. The FDA has determined that such clearance or approval ?is not necessary. This test is used for clinical purposes. It should not ?be regarded as investigational or for research. This laboratory is ?certified under the Clinical Laboratory Improvement Amendments of 1988 ?(CLIA-88) as qualified to perform high complexity clinical laboratory ?testing.  The controls stained appropriately. ?  ?Basic Metabolic Panel     Status: Abnormal  ? Collection Time: 12/21/21  9:01 AM  ?Result Value Ref Range  ? Sodium 141 135 - 145 mEq/L  ? Potassium 3.9  3.5 - 5.1 mEq/L  ? Chloride 106 96 - 112 mEq/L  ? CO2 28 19 - 32 mEq/L  ? Glucose, Bld 107 (H) 70 - 99 mg/dL  ? BUN 17 6 - 23 mg/dL  ? Creatinine, Ser 1.21 0.40 - 1.50 mg/dL  ? GFR 57.14 (L) >60.00 mL/min  ?  Comment: Calculated using the  CKD-EPI Creatinine Equation (2021)  ? Calcium 9.9 8.4 - 10.5 mg/dL  ?Lipid panel     Status: Abnormal  ? Collection Time: 12/21/21  9:01 AM  ?Result Value Ref Range  ? Cholesterol 156 0 - 200 mg/dL  ?  Comment: ATP III Classification       Desirable:  < 200 mg/dL               Borderline High:  200 - 239 mg/dL          High:  > = 240 mg/dL  ? Triglycerides 227.0 (H) 0.0 - 149.0 mg/dL  ?  Comment: Normal:  <150 mg/dLBorderline High:  150 - 199 mg/dL  ? HDL 30.40 (L) >39.00 mg/dL  ? VLDL 45.4 (H) 0.0 - 40.0 mg/dL  ? Total CHOL/HDL Ratio 5   ?  Comment:                Men          Women1/2 Average Risk     3.4          3.3Average Risk          5.0          4.42X Average Risk          9.6          7.13X Average Risk          15.0          11.0                      ? NonHDL 125.66   ?  Comment: NOTE:  Non-HDL goal should be 30 mg/dL higher than patient's LDL goal (i.e. LDL goal of < 70 mg/dL, would have non-HDL goal of < 100 mg/dL)  ?LDL cholesterol, direct     Status: None  ? Collection Time: 12/21/21  9:01 AM  ?Result Value Ref Range  ? Direct LDL 91.0 mg/dL  ?  Comment: Optimal:  <100 mg/dLNear or Above Optimal:  100-129 mg/dLBorderline High:  130-159 mg/dLHigh:  160-189 mg/dLVery High:  >190 mg/dL  ?  ? ?Psychiatric Specialty Exam: ?Physical Exam  ?Review of Systems  ?Weight 268 lb (121.6 kg).Body mass index is 43.26 kg/m?.  ?General Appearance: NA  ?Eye Contact:  NA  ?Speech:  Slow  ?Volume:  Decreased  ?Mood:  Euthymic  ?Affect:  NA  ?Thought Process:  Descriptions of Associations: Intact  ?Orientation:  Full (Time, Place, and Person)  ?Thought Content:  Logical  ?Suicidal Thoughts:  No  ?Homicidal Thoughts:  No  ?Memory:  Immediate;   Good ?Recent;   Fair ?Remote;   Fair  ?Judgement:  Fair  ?Insight:  Fair  ?Psychomotor Activity:  NA  ?Concentration:  Concentration: Fair and Attention Span: Fair  ?Recall:  Good  ?Fund of Knowledge:  Good  ?Language:  Good  ?Akathisia:  No  ?Handed:  Right  ?AIMS (if indicated):      ?Assets:  Communication Skills ?Desire for Improvement ?Housing ?Resilience ?Social Support  ?ADL's:  Intact  ?Cognition:  WNL  ?Sleep:   ok  ? ? ? ? ?  Assessment and Plan: ?Bipolar disorder type I.  Generalized anxiety disorder. ? ?I reviewed blood work results.  He does not want to change the medication.  Continue Abilify injection 4 mg intramuscular every 4 weeks, Cogentin 0.5 mg at bedtime, Celexa 20 mg daily.  He like to have his temazepam sent [facility] as [facility] do not take fax of controlled substance.  Discussed medication side effects and benefits.  Recommended to call us back if is any question or any concern.  Follow-up in 3 months. ? ?Follow Up Instructions: ? ?  ?I discussed the assessment and treatment plan with the patient. The patient was provided an opportunity to ask questions and all were answered. The patient agreed with the plan and demonstrated an understanding of the instructions. ?  ?The patient was advised to call back or seek an in-person evaluation if the symptoms worsen or if the condition fails to improve as anticipated. ? ?Collaboration of Care: Primary Care Provider AEB notes are available in epic to review. ? ?Patient/Guardian was advised Release of Information must be obtained prior to any record release in order to collaborate their care with an outside provider. Patient/Guardian was advised if they have not already done so to contact the registration department to sign all necessary forms in order for [place] to release information regarding their care.  ? ?Consent: Patient/Guardian gives verbal consent for treatment and assignment of benefits for services provided during this visit. Patient/Guardian expressed understanding and agreed to proceed.   ? ?I provided 19 minutes of non-face-to-face time during this encounter. ? ? ?[name], MD  ?

## 2022-01-28 ENCOUNTER — Telehealth: Payer: Self-pay | Admitting: Pharmacist

## 2022-01-28 NOTE — Progress Notes (Addendum)
? ? ?Chronic Care Management ?Pharmacy Assistant  ? ?Name: Sean Hill  MRN: [id number] DOB: Dec 17, 1940 ? ?Reason for Encounter: Medication Review ?  ? ?Recent office visits:  ?12/21/21 Sean Peng, NP - Patient presented for Hyperlipidemia and other concerns. Stopped Oxymetazoline. ? ?Recent consult visits:  ?01/27/22 Arfeen Sean Hill) - Patient presented via video for Bipolar disorder and other concerns. No visit details available. ? ?Hospital visits:  ?None in previous 6 months ?Patient presented to [facility] on 11/11/21 for Colonoscopy, was present for 3 hours. ? ?Medications: ?Outpatient Encounter Medications as of 01/28/2022  ?Medication Sig  ? albuterol (VENTOLIN HFA) 108 (90 Base) MCG/ACT inhaler Inhale 2 puffs into the lungs every 6 (six) hours as needed for wheezing or shortness of breath.  ? ARIPiprazole ER (ABILIFY MAINTENA) 400 MG PRSY prefilled syringe Inject 400 mg into the muscle every 28 (twenty-eight) days.  ? azelastine (ASTELIN) 0.1 % nasal spray Place 2 sprays into both nostrils 2 (two) times daily. Use in each nostril as directed  ? benztropine (COGENTIN) 0.5 MG tablet Take 1 tablet (0.5 mg total) by mouth daily.  ? citalopram (CELEXA) 20 MG tablet Take 1 tablet (20 mg total) by mouth daily.  ? ezetimibe (ZETIA) 10 MG tablet TAKE ONE TABLET BY MOUTH AT NOON (Patient taking differently: Take 10 mg by mouth daily.)  ? fenofibrate (TRICOR) 145 MG tablet Take 1 tablet (145 mg total) by mouth daily.  ? icosapent Ethyl (VASCEPA) 1 g capsule Take 2 g by mouth 2 (two) times daily.  ? ipratropium-albuterol (DUONEB) 0.5-2.5 (3) MG/3ML SOLN Take 3 mLs by nebulization every 6 (six) hours as needed. (Patient taking differently: Take 3 mLs by nebulization every 6 (six) hours as needed (shortness of breath).)  ? losartan (COZAAR) 50 MG tablet TAKE ONE TABLET BY MOUTH EVERY MORNING (Patient taking differently: Take 50 mg by mouth daily.)  ? Multiple Vitamin (MULTIVITAMIN WITH MINERALS) TABS  tablet Take 1 tablet by mouth daily.  ? Multiple Vitamins-Minerals (PRESERVISION AREDS 2 PO) Take 1 capsule by mouth 2 (two) times daily.  ? naproxen sodium (ALEVE) 220 MG tablet Take 440 mg by mouth daily as needed (pain).  ? Omega-3 Fatty Acids (FISH OIL) 1000 MG CAPS Take 1,000 mg by mouth in the morning and at bedtime.  ? pantoprazole (PROTONIX) 40 MG tablet TAKE ONE TABLET BY MOUTH TWICE DAILY (Patient taking differently: 40 mg 2 (two) times daily.)  ? tamsulosin (FLOMAX) 0.4 MG CAPS capsule Take 1 capsule (0.4 mg total) by mouth daily.  ? temazepam (RESTORIL) 30 MG capsule Take 1 capsule (30 mg total) by mouth at bedtime.  ? Vitamin D, Cholecalciferol, 1000 UNITS TABS Take 1,000 Units by mouth daily.   ? ?Facility-Administered Encounter Medications as of 01/28/2022  ?Medication  ? ARIPiprazole ER (ABILIFY MAINTENA) 400 MG prefilled syringe 400 mg  ?Reviewed chart for medication changes ahead of medication coordination call. ? ?No OVs, Consults, or hospital visits since last care coordination call/Pharmacist visit.  ? ?No medication changes indicated  ?BP Readings from Last 3 Encounters:  ?12/21/21 120/64  ?11/11/21 (!) 143/67  ?10/26/21 110/70  ?  ?Lab Results  ?Component Value Date  ? HGBA1C 6.0 (H) 08/16/2021  ?  ? ?Patient obtains medications through Adherence Packaging  90 Days  ? ?Last adherence delivery included: ?Losartan (COZAAR) 50 MG tablet: one tablet at breakfast ?Ezetimibe (ZETIA) 10 mg: one tablet at lunch ?Pantoprazole (Protonix) 40 mg : one tab at breakfast and one at dinner ?  Benztropine (COGENTIN) 0.5 mg: one tablet at lunch ?Citalopram (CELEXA) 20 mg: one tablet at breakfast ?Certavite senior- one tablet at breakfast ?Preservision Areds-2gt: one at breakfast and one at bedtime ?Vitamin D3 1000 u: one tablet at breakfast ?Fish oil 1000 - take one capsule at breakfast and one at evening meal ?Cetirizine 10 mg: one tablet at breakfast ?Fenofibrate 145 mg: one tablet at breakfast ? ?Patient is due  for next adherence delivery on: 02/09/22. ?Called patient and reviewed medications and coordinated delivery. ?Packs for 90 DS ? ?This delivery to include: ?Cetirizine 10 mg: one tablet at breakfast ?Mens Multivitamin : Take one at breakfast ?Preservision Areds-2gt: one at breakfast and one at bedtime   ?Vitamin D3 1000 u: one tablet at breakfast ?Fenofibrate 145 mg: one tablet at breakfast ?Ezetimibe (ZETIA) 10 mg: one tablet at lunch ?Losartan (COZAAR) 50 MG tablet: one tablet at breakfast ?Pantoprazole (Protonix) 40 mg : one tab at breakfast and one at dinner ?Benztropine (COGENTIN) 0.5 mg: one tablet at lunch ?Citalopram (CELEXA) 20 mg: one tablet at breakfast ?Vascepa 1 gm: Take 2g at breakfast and 2g at [employer] ?Tamsulosin 0.4 mg: take one capsule at breakfast ? ? ?Patient declined the following medications  ?Fish oil 1000 - take one capsule at breakfast and one at evening meal  ( per wife pt using vascepa in place of this) ? ? ? ?Confirmed delivery date of 02/09/22, advised patient that pharmacy will contact them the morning of delivery.  ? ?Care Gaps: ?BP- 120/64 ( 12/21/21) ?AWV- 1/23 ?CCM- 7/23 ? ?Star Rating Drugs: ?Losartan (Cozaar) 50 mg - Last filled 11/05/21 90 DS at [facility] ? ? ?Ned Clines CMA ?Clinical Pharmacist Assistant ?[phone] ? ?

## 2022-02-01 ENCOUNTER — Other Ambulatory Visit: Payer: Self-pay

## 2022-02-01 DIAGNOSIS — N401 Enlarged prostate with lower urinary tract symptoms: Secondary | ICD-10-CM

## 2022-02-01 MED ORDER — TAMSULOSIN HCL 0.4 MG PO CAPS: 0.4000 mg | ORAL_CAPSULE | Freq: Every day | ORAL | 1 refills | Status: DC

## 2022-02-01 NOTE — Addendum Note (Signed)
Addended by: Viona Gilmore on: 02/01/2022 10:33 AM ? ? Modules accepted: Orders ? ?

## 2022-02-07 DIAGNOSIS — H353111 Nonexudative age-related macular degeneration, right eye, early dry stage: Secondary | ICD-10-CM | POA: Diagnosis not present

## 2022-02-07 DIAGNOSIS — H353121 Nonexudative age-related macular degeneration, left eye, early dry stage: Secondary | ICD-10-CM | POA: Diagnosis not present

## 2022-02-07 DIAGNOSIS — H02834 Dermatochalasis of left upper eyelid: Secondary | ICD-10-CM | POA: Diagnosis not present

## 2022-02-07 DIAGNOSIS — H02831 Dermatochalasis of right upper eyelid: Secondary | ICD-10-CM | POA: Diagnosis not present

## 2022-02-17 ENCOUNTER — Ambulatory Visit (INDEPENDENT_AMBULATORY_CARE_PROVIDER_SITE_OTHER): Payer: PPO | Admitting: Adult Health

## 2022-02-17 ENCOUNTER — Encounter: Payer: Self-pay | Admitting: Adult Health

## 2022-02-17 ENCOUNTER — Encounter (HOSPITAL_COMMUNITY): Payer: Self-pay

## 2022-02-17 ENCOUNTER — Ambulatory Visit (HOSPITAL_BASED_OUTPATIENT_CLINIC_OR_DEPARTMENT_OTHER): Payer: PPO

## 2022-02-17 VITALS — BP 116/78 | HR 72 | Temp 98.2°F | Ht 68.0 in | Wt 268.0 lb

## 2022-02-17 VITALS — BP 120/60 | HR 73 | Temp 98.2°F | Ht 68.0 in | Wt 268.0 lb

## 2022-02-17 DIAGNOSIS — F319 Bipolar disorder, unspecified: Secondary | ICD-10-CM

## 2022-02-17 DIAGNOSIS — J4541 Moderate persistent asthma with (acute) exacerbation: Secondary | ICD-10-CM | POA: Diagnosis not present

## 2022-02-17 MED ORDER — IPRATROPIUM-ALBUTEROL 0.5-2.5 (3) MG/3ML IN SOLN
3.0000 mL | Freq: Once | RESPIRATORY_TRACT | Status: AC
  Administered 2022-02-17: 3 mL via RESPIRATORY_TRACT

## 2022-02-17 MED ORDER — PREDNISONE 10 MG PO TABS: ORAL_TABLET | ORAL | 0 refills | Status: DC

## 2022-02-17 MED ORDER — DOXYCYCLINE HYCLATE 100 MG PO CAPS: 100.0000 mg | ORAL_CAPSULE | Freq: Two times a day (BID) | ORAL | 0 refills | Status: DC

## 2022-02-17 NOTE — Progress Notes (Signed)
Subjective:    Patient ID: Sean Hill, male    DOB: [birth year], 81 y.o.   MRN: [id number]  HPI 81 year old male who  has a past medical history of Anal fissure, Anxiety, Arthritis, Asthma, Bipolar disorder ([place]), Cataract, Depression, Difficult airway for intubation, GERD (gastroesophageal reflux disease), Hemorrhage of colon following colonoscopy (10/08/2019), History of kidney stones, History of MRSA infection, Hyperlipidemia, Hypertension, Impaired hearing, Left bundle branch block (LBBB) on electrocardiogram (10/29/2015), Macular degeneration (senile) of retina, Pre-diabetes, and Wears glasses.  He presents to the office today for an acute on chronic issue of respiratory infection. He has a history of Persistent interstitial lung disease and history of asthma. He is prescribed an albuterol inhaler as well as dueoneb nebulizer   Over the last two weeks he has been experiencing cough, congestion, shortness of breath and wheezing. He denies fevers or chills.   He has not been using his inhalers or nebulizers   Review of Systems See HPI   Past Medical History:  Diagnosis Date   Anal fissure    Anxiety    Arthritis    right wrist; pt. states "everywhere"   Asthma    Bipolar disorder ([place])    Cataract    Depression    Difficult airway for intubation    GERD (gastroesophageal reflux disease)    Hemorrhage of colon following colonoscopy 10/08/2019   History of kidney stones    History of MRSA infection    left knee after arthroplasty   Hyperlipidemia    Hypertension    states under control with med., has been on med. x 1 yr.   Impaired hearing    Left bundle branch block (LBBB) on electrocardiogram 10/29/2015   Macular degeneration (senile) of retina    left   Pre-diabetes    Wears glasses     Social History   Socioeconomic History   Marital status: Married    Spouse name: Not on file   Number of children: Not on file   Years of education: Not on file   Highest  education level: Not on file  Occupational History   Not on file  Tobacco Use   Smoking status: Never   Smokeless tobacco: Never  Vaping Use   Vaping Use: Never used  Substance and Sexual Activity   Alcohol use: Not Currently    Comment: occasional.   Drug use: No   Sexual activity: Not Currently    Birth control/protection: None  Other Topics Concern   Not on file  Social History Narrative   Married, lives in [place].  Retired Dance movement psychotherapist.   Two daughters, both married,3 grandchildren ( 11, 62, 1 year)    No regular exercise.  Never smoker.  No ETOH.  No drugs.   Social Determinants of Health   Financial Resource Strain: Low Risk    Difficulty of Paying Living Expenses: Not hard at all  Food Insecurity: No Food Insecurity   Worried About Charity fundraiser in the Last Year: Never true   [place] in the Last Year: Never true  Transportation Needs: No Transportation Needs   Lack of Transportation (Medical): No   Lack of Transportation (Non-Medical): No  Physical Activity: Inactive   Days of Exercise per Week: 0 days   Minutes of Exercise per Session: 0 min  Stress: No Stress Concern Present   Feeling of Stress : Not at all  Social Connections: Moderately Isolated   Frequency of Communication with  Friends and Family: More than three times a week   Frequency of Social Gatherings with Friends and Family: More than three times a week   Attends Religious Services: Never   Marine scientist or Organizations: No   Attends Archivist Meetings: Never   Marital Status: Married  Human resources officer Violence: Not At Risk   Fear of Current or Ex-Partner: No   Emotionally Abused: No   Physically Abused: No   Sexually Abused: No    Past Surgical History:  Procedure Laterality Date   ANKLE FUSION Bilateral    ANKLE SURGERY Bilateral    ligament surgery   BIOPSY  04/29/2020   Procedure: BIOPSY;  Surgeon: Lavena Bullion, DO;  Location: [facility];   Service: Gastroenterology;;   CARDIAC CATHETERIZATION  x 2   1983; 11/14/2003   CARPOMETACARPEL SUSPENSION PLASTY Right 09/22/2016   Procedure: right thumb carpometacarpal  ARTHROPLASTY;  Surgeon: Leanora Cover, MD;  Location: [facility];  Service: Orthopedics;  Laterality: Right;  right thumb carpometacarpal  ARTHROPLASTY   CARPOMETACARPEL SUSPENSION PLASTY Right 11/02/2017   Procedure: RIGHT THUMB SUSPENSIONPLASTY WITH TIGHTROPE, DISTAL POLE SCAPHOID  EXCISION;  Surgeon: Leanora Cover, MD;  Location: [facility];  Service: Orthopedics;  Laterality: Right;   COLONOSCOPY WITH PROPOFOL N/A 10/02/2019   Procedure: COLONOSCOPY WITH PROPOFOL;  Surgeon: Lavena Bullion, DO;  Location: [facility];  Service: Gastroenterology;  Laterality: N/A;   COLONOSCOPY WITH PROPOFOL N/A 10/09/2019   Procedure: COLONOSCOPY WITH PROPOFOL;  Surgeon: Gatha Mayer, MD;  Location: [facility];  Service: Endoscopy;  Laterality: N/A;   COLONOSCOPY WITH PROPOFOL N/A 04/29/2020   Procedure: COLONOSCOPY WITH PROPOFOL;  Surgeon: Lavena Bullion, DO;  Location: [facility];  Service: Gastroenterology;  Laterality: N/A;   COLONOSCOPY WITH PROPOFOL N/A 11/11/2021   Procedure: COLONOSCOPY WITH PROPOFOL;  Surgeon: Lavena Bullion, DO;  [facility]: [facility];  Service: Gastroenterology;  Laterality: N/A;   ELBOW SURGERY Left    ENDOSCOPIC MUCOSAL RESECTION N/A 10/02/2019   Procedure: ENDOSCOPIC MUCOSAL RESECTION;  Surgeon: Lavena Bullion, DO;  [facility];  Service: Gastroenterology;  Laterality: N/A;   HEMOSTASIS CLIP PLACEMENT  10/09/2019   Procedure: HEMOSTASIS CLIP PLACEMENT;  Surgeon: Gatha Mayer, MD;  [facility];  Service: Endoscopy;;   HEMOSTASIS CLIP PLACEMENT  11/11/2021   Procedure: HEMOSTASIS CLIP PLACEMENT;  Surgeon: Lavena Bullion, DO;  [facility];  Service: Gastroenterology;;   INGUINAL HERNIA REPAIR Right    KNEE SURGERY      OPEN REDUCTION INTERNAL FIXATION (ORIF) DISTAL RADIAL FRACTURE Right 10/29/2015   Procedure: OPEN REDUCTION INTERNAL FIXATION (ORIF) DISTAL RADIAL FRACTURE;  Surgeon: Leanora Cover, MD;  Location: [facility];  Service: Orthopedics;  Laterality: Right;   ORIF ELBOW FRACTURE Right    POLYPECTOMY  04/29/2020   Procedure: POLYPECTOMY;  Surgeon: Lavena Bullion, DO;  [facility];  Service: Gastroenterology;;   POLYPECTOMY  11/11/2021   Procedure: POLYPECTOMY;  Surgeon: Lavena Bullion, DO;  [facility];  Service: Gastroenterology;;   SUBMUCOSAL LIFTING INJECTION  10/02/2019   Procedure: SUBMUCOSAL LIFTING INJECTION;  Surgeon: Lavena Bullion, DO;  [facility];  Service: Gastroenterology;;   TONSILLECTOMY     TOTAL KNEE ARTHROPLASTY Bilateral    TOTAL KNEE REVISION Right 08/25/2021   Procedure: Right knee polyethylene vs total knee arthroplasty revision;  Surgeon: Gaynelle Arabian, MD;  Location: [facility];  Service: Orthopedics;  Laterality: Right;   ULNAR  COLLATERAL LIGAMENT REPAIR Right 12/29/2015   Procedure: REPAIR  RIGHT LATERAL ULNAR COLLATERAL LIGAMENT TEAR  EXTENSOR ORIGIN ;  Surgeon: Leanora Cover, MD;  Location: [facility];  Service: Orthopedics;  Laterality: Right;   WRIST ARTHROSCOPY WITH DEBRIDEMENT Right 07/14/2016   Procedure: RIGHT WRIST ARTHROSCOPY WITH DEBRIDEMENT  TRIANGULAR FIBROCARTILAGE COMPLEX;  Surgeon: Leanora Cover, MD;  Location: [facility];  Service: Orthopedics;  Laterality: Right;    Family History  Problem Relation Age of Onset   Alcohol abuse Mother    Ovarian cancer Mother    Alcohol abuse Father    Pancreatic cancer Father    Hyperlipidemia Brother    Barrett's esophagus Brother    Colon cancer Maternal Grandmother    Depression Daughter    Paranoid behavior Daughter    Esophageal cancer Neg Hx    Stomach cancer Neg Hx    Rectal cancer Neg Hx    Colon polyps Neg Hx     Allergies   Allergen Reactions   Penicillins Other (See Comments)    BLISTERS Did it involve swelling of the face/tongue/throat, SOB, or low BP? No Did it involve sudden or severe rash/hives, skin peeling, or any reaction on the inside of your mouth or nose? No Did you need to seek medical attention at a hospital or doctor's office? No When did it last happen? around 1980    If all above answers are "NO", may proceed with cephalosporin use. Tolerated Cephalosporin Date: 08/26/21.     Lisinopril Diarrhea and Cough    Dry cough, abdominal bloating and diarrhea   Codeine Rash    Current Outpatient Medications on File Prior to Visit  Medication Sig Dispense Refill   albuterol (VENTOLIN HFA) 108 (90 Base) MCG/ACT inhaler Inhale 2 puffs into the lungs every 6 (six) hours as needed for wheezing or shortness of breath. 6.7 g 3   ARIPiprazole ER (ABILIFY MAINTENA) 400 MG PRSY prefilled syringe Inject 400 mg into the muscle every 28 (twenty-eight) days. 1 each 2   azelastine (ASTELIN) 0.1 % nasal spray Place 2 sprays into both nostrils 2 (two) times daily. Use in each nostril as directed 30 mL 6   benztropine (COGENTIN) 0.5 MG tablet Take 1 tablet (0.5 mg total) by mouth daily. 90 tablet 0   citalopram (CELEXA) 20 MG tablet Take 1 tablet (20 mg total) by mouth daily. 90 tablet 0   ezetimibe (ZETIA) 10 MG tablet TAKE ONE TABLET BY MOUTH AT NOON (Patient taking differently: Take 10 mg by mouth daily.) 90 tablet 3   fenofibrate (TRICOR) 145 MG tablet Take 1 tablet (145 mg total) by mouth daily. 90 tablet 3   icosapent Ethyl (VASCEPA) 1 g capsule Take 2 g by mouth 2 (two) times daily.     ipratropium-albuterol (DUONEB) 0.5-2.5 (3) MG/3ML SOLN Take 3 mLs by nebulization every 6 (six) hours as needed. (Patient taking differently: Take 3 mLs by nebulization every 6 (six) hours as needed (shortness of breath).) 360 mL 0   losartan (COZAAR) 50 MG tablet TAKE ONE TABLET BY MOUTH EVERY MORNING (Patient taking  differently: Take 50 mg by mouth daily.) 90 tablet 1   Multiple Vitamin (MULTIVITAMIN WITH MINERALS) TABS tablet Take 1 tablet by mouth daily.     Multiple Vitamins-Minerals (PRESERVISION AREDS 2 PO) Take 1 capsule by mouth 2 (two) times daily.     naproxen sodium (ALEVE) 220 MG tablet Take 440 mg by mouth daily as needed (pain).  pantoprazole (PROTONIX) 40 MG tablet TAKE ONE TABLET BY MOUTH TWICE DAILY (Patient taking differently: 40 mg 2 (two) times daily.) 180 tablet 1   tamsulosin (FLOMAX) 0.4 MG CAPS capsule Take 1 capsule (0.4 mg total) by mouth daily. 90 capsule 1   temazepam (RESTORIL) 30 MG capsule Take 1 capsule (30 mg total) by mouth at bedtime. 90 capsule 0   Vitamin D, Cholecalciferol, 1000 UNITS TABS Take 1,000 Units by mouth daily.      Current Facility-Administered Medications on File Prior to Visit  Medication Dose Route Frequency Provider Last Rate Last Admin   ARIPiprazole ER (ABILIFY MAINTENA) 400 MG prefilled syringe 400 mg  400 mg Intramuscular Q28 days Arfeen, Arlyce Harman, MD   400 mg at 02/17/22 1120    BP 120/60   Pulse 73   Temp 98.2 F (36.8 C) (Oral)   Ht '5\' 8"'$  (1.727 m)   Wt 268 lb (121.6 kg)   SpO2 94%   BMI 40.75 kg/m       Objective:   Physical Exam Vitals and nursing note reviewed.  Constitutional:      Appearance: Normal appearance.  Cardiovascular:     Rate and Rhythm: Normal rate and regular rhythm.     Pulses: Normal pulses.     Heart sounds: Normal heart sounds.  Pulmonary:     Effort: Pulmonary effort is normal.     Breath sounds: Decreased air movement present. Examination of the right-upper field reveals wheezing. Examination of the left-upper field reveals wheezing. Examination of the right-middle field reveals decreased breath sounds and wheezing. Examination of the left-middle field reveals decreased breath sounds and wheezing. Examination of the right-lower field reveals decreased breath sounds and wheezing. Examination of the left-lower  field reveals decreased breath sounds and wheezing. Decreased breath sounds and wheezing (coarse wheezing) present. No rhonchi or rales.  Musculoskeletal:        General: Normal range of motion.  Skin:    General: Skin is warm.  Neurological:     General: No focal deficit present.     Mental Status: He is alert. Mental status is at baseline.  Psychiatric:        Mood and Affect: Mood normal.        Behavior: Behavior normal.        Thought Content: Thought content normal.       Assessment & Plan:  1. Moderate persistent asthma with acute exacerbation -  Advised using his nebulizer and rescue inhaler as needed. His wheezing resolved after dueoneb and he could breath easier  - Samples of Breztri were gwere given to the patient, quantity 3, Lot Number [id number] as daily inhaler. Advised two puffs BID.   - doxycycline (VIBRAMYCIN) 100 MG capsule; Take 1 capsule (100 mg total) by mouth 2 (two) times daily.  Dispense: 14 capsule; Refill: 0 - predniSONE (DELTASONE) 10 MG tablet; 40 mg x 3 days, 20 mg x 3 days, 10 mg x 3 days  Dispense: 21 tablet; Refill: 0 - ipratropium-albuterol (DUONEB) 0.5-2.5 (3) MG/3ML nebulizer solution 3 mL  [name], NP  Time of total for care on the day of the encounter 30 minutes. This includes time spent in both face-to-face and non-face-to-face activities including preparing for the visit, reviewing the chart, time spent with patient, evaluation and counseling patient/family/caregiver, coordinating care, and time spent documenting in the chart which was performed on the date of service (02/17/2022). Note: this excludes any time spent performing billable procedures or separate charges (  such as time spent counseling smoking cessation); these charges are billed separately.

## 2022-02-17 NOTE — Progress Notes (Signed)
Patient presented with appropriate affect, level mood and denied any current suicidal or homicidal ideations, no auditory of visual hallucinations and only concerns for continued weight increase.  Patient stated he likes the Abilify but just knows it causes some increased appetite for him.  Patient reports inability to exercise as much since having knee surgery 12/22 but plans to work on increasing walking and activities as he stated he is improving.  Patient stated plan to discuss weight and appetite with Dr. Adele Schilder at next appointment although he is not in favor or changing any current medications at this time due to reported doing well with present regimen.  Patient's due Abilify Maintena 400 mg IM injection every 28 days prepared as ordered and given to patient in is right upper outer quadrant.  Patient tolerated due injection without complaint of pain or discomfort and agreed to return in 4 weeks for next due injection.

## 2022-02-17 NOTE — Patient Instructions (Signed)
It was great seeing you today   Please use your rescue inhaler and nebulizers as needed  I have given you samples of a daily inhaler called Breztri - two puffs twice a day

## 2022-03-07 ENCOUNTER — Encounter: Payer: Self-pay | Admitting: Adult Health

## 2022-03-08 ENCOUNTER — Other Ambulatory Visit: Payer: Self-pay | Admitting: Adult Health

## 2022-03-08 NOTE — Telephone Encounter (Signed)
Please advise 

## 2022-03-10 ENCOUNTER — Ambulatory Visit (HOSPITAL_COMMUNITY): Payer: PPO

## 2022-03-10 ENCOUNTER — Ambulatory Visit (INDEPENDENT_AMBULATORY_CARE_PROVIDER_SITE_OTHER): Payer: PPO | Admitting: Adult Health

## 2022-03-10 ENCOUNTER — Ambulatory Visit (INDEPENDENT_AMBULATORY_CARE_PROVIDER_SITE_OTHER): Payer: PPO

## 2022-03-10 ENCOUNTER — Encounter: Payer: Self-pay | Admitting: Adult Health

## 2022-03-10 VITALS — BP 130/60 | HR 89 | Temp 98.2°F | Ht 68.0 in | Wt 268.0 lb

## 2022-03-10 DIAGNOSIS — R051 Acute cough: Secondary | ICD-10-CM | POA: Diagnosis not present

## 2022-03-10 DIAGNOSIS — I700 Atherosclerosis of aorta: Secondary | ICD-10-CM | POA: Diagnosis not present

## 2022-03-10 DIAGNOSIS — R1310 Dysphagia, unspecified: Secondary | ICD-10-CM

## 2022-03-10 DIAGNOSIS — R0602 Shortness of breath: Secondary | ICD-10-CM | POA: Diagnosis not present

## 2022-03-10 DIAGNOSIS — M47814 Spondylosis without myelopathy or radiculopathy, thoracic region: Secondary | ICD-10-CM | POA: Diagnosis not present

## 2022-03-10 NOTE — Progress Notes (Signed)
Subjective:    Patient ID: Sean Hill, male    DOB: 1941-02-24, 81 y.o.   MRN: [id number]  HPI 81 year old male who  has a past medical history of Anal fissure, Anxiety, Arthritis, Asthma, Bipolar disorder ([place]), Cataract, Depression, Difficult airway for intubation, GERD (gastroesophageal reflux disease), Hemorrhage of colon following colonoscopy (10/08/2019), History of kidney stones, History of MRSA infection, Hyperlipidemia, Hypertension, Impaired hearing, Left bundle branch block (LBBB) on electrocardiogram (10/29/2015), Macular degeneration (senile) of retina, Pre-diabetes, and Wears glasses.  He presents to the office today for continued cough and shortness of breath.  He has a history of persistent interstitial lung disease and history of asthma.  When he was last seen 21 days ago he was experiencing constant cough, shortness of breath, wheezing, and congestion.  At this time he denied fevers or chills.  He was not using his prescribed albuterol inhaler or nebulizer at this time.  In the office he received a DuoNeb and his wheezing resolved and he reported that he is able to breathe easier.  He was also given samples of Breztri , prescribed a course of doxycycline and prednisone taper.  He reports today that he had some minor improvement in his symptoms but went to the beach for a week.  Reports having a rough time managing the stairs in the condo and walking on the beach to the shortness of breath.  His cough continues to be pretty bad as well.  While at the beach he was experiencing dysphagia and his wife who is with him today is concerned that he may have aspirated.  He does have a history of dysphagia and has been seen by GI in the past, had esophageal dilation done back in 2020.  At this time he was having the same symptoms of chronic cough, shortness of breath, dysphagia.  From what he remembers this helped significantly.  He does have an appointment with GI scheduled  for  roughly 5 weeks from now.  He is taking his Protonix 40 mg twice daily as directed  He has not experienced any fevers or chills.    Review of Systems See HPI   Past Medical History:  Diagnosis Date   Anal fissure    Anxiety    Arthritis    right wrist; pt. states "everywhere"   Asthma    Bipolar disorder ([place])    Cataract    Depression    Difficult airway for intubation    GERD (gastroesophageal reflux disease)    Hemorrhage of colon following colonoscopy 10/08/2019   History of kidney stones    History of MRSA infection    left knee after arthroplasty   Hyperlipidemia    Hypertension    states under control with med., has been on med. x 1 yr.   Impaired hearing    Left bundle branch block (LBBB) on electrocardiogram 10/29/2015   Macular degeneration (senile) of retina    left   Pre-diabetes    Wears glasses     Social History   Socioeconomic History   Marital status: Married    Spouse name: Not on file   Number of children: Not on file   Years of education: Not on file   Highest education level: Not on file  Occupational History   Not on file  Tobacco Use   Smoking status: Never   Smokeless tobacco: Never  Vaping Use   Vaping Use: Never used  Substance and Sexual Activity   Alcohol  use: Not Currently    Comment: occasional.   Drug use: No   Sexual activity: Not Currently    Birth control/protection: None  Other Topics Concern   Not on file  Social History Narrative   Married, lives in [place].  Retired Dance movement psychotherapist.   Two daughters, both married,3 grandchildren ( 87, 47, 1 year)    No regular exercise.  Never smoker.  No ETOH.  No drugs.   Social Determinants of Health   Financial Resource Strain: Low Risk  (09/23/2021)   Overall Financial Resource Strain (CARDIA)    Difficulty of Paying Living Expenses: Not hard at all  Food Insecurity: No Food Insecurity (09/23/2021)   Hunger Vital Sign    Worried About Running Out of Food in the Last Year:  Never true    Ran Out of Food in the Last Year: Never true  Transportation Needs: No Transportation Needs (09/23/2021)   PRAPARE - Hydrologist (Medical): No    Lack of Transportation (Non-Medical): No  Physical Activity: Inactive (09/23/2021)   Exercise Vital Sign    Days of Exercise per Week: 0 days    Minutes of Exercise per Session: 0 min  Stress: No Stress Concern Present (09/23/2021)   [place]    Feeling of Stress : Not at all  Social Connections: Moderately Isolated (09/23/2021)   Social Connection and Isolation Panel [NHANES]    Frequency of Communication with Friends and Family: More than three times a week    Frequency of Social Gatherings with Friends and Family: More than three times a week    Attends Religious Services: Never    Marine scientist or Organizations: No    Attends Archivist Meetings: Never    Marital Status: Married  Human resources officer Violence: Not At Risk (09/23/2021)   Humiliation, Afraid, Rape, and Kick questionnaire    Fear of Current or Ex-Partner: No    Emotionally Abused: No    Physically Abused: No    Sexually Abused: No    Past Surgical History:  Procedure Laterality Date   ANKLE FUSION Bilateral    ANKLE SURGERY Bilateral    ligament surgery   BIOPSY  04/29/2020   Procedure: BIOPSY;  Surgeon: Lavena Bullion, DO;  Location: [facility];  Service: Gastroenterology;;   CARDIAC CATHETERIZATION  x 2   1983; 11/14/2003   CARPOMETACARPEL SUSPENSION PLASTY Right 09/22/2016   Procedure: right thumb carpometacarpal  ARTHROPLASTY;  Surgeon: Leanora Cover, MD;  Location: [facility];  Service: Orthopedics;  Laterality: Right;  right thumb carpometacarpal  ARTHROPLASTY   CARPOMETACARPEL SUSPENSION PLASTY Right 11/02/2017   Procedure: RIGHT THUMB SUSPENSIONPLASTY WITH TIGHTROPE, DISTAL POLE SCAPHOID  EXCISION;  Surgeon: Leanora Cover, MD;   Location: [facility];  Service: Orthopedics;  Laterality: Right;   COLONOSCOPY WITH PROPOFOL N/A 10/02/2019   Procedure: COLONOSCOPY WITH PROPOFOL;  Surgeon: Lavena Bullion, DO;  Location: [facility];  Service: Gastroenterology;  Laterality: N/A;   COLONOSCOPY WITH PROPOFOL N/A 10/09/2019   Procedure: COLONOSCOPY WITH PROPOFOL;  Surgeon: Gatha Mayer, MD;  Location: [facility];  Service: Endoscopy;  Laterality: N/A;   COLONOSCOPY WITH PROPOFOL N/A 04/29/2020   Procedure: COLONOSCOPY WITH PROPOFOL;  Surgeon: Lavena Bullion, DO;  Location: [facility];  Service: Gastroenterology;  Laterality: N/A;   COLONOSCOPY WITH PROPOFOL N/A 11/11/2021   Procedure: COLONOSCOPY WITH PROPOFOL;  Surgeon: Lavena Bullion, DO;  [facility]: [facility];  Service: Gastroenterology;  Laterality: N/A;   ELBOW SURGERY Left    ENDOSCOPIC MUCOSAL RESECTION N/A 10/02/2019   Procedure: ENDOSCOPIC MUCOSAL RESECTION;  Surgeon: Lavena Bullion, DO;  [facility]: [facility];  Service: Gastroenterology;  Laterality: N/A;   HEMOSTASIS CLIP PLACEMENT  10/09/2019   Procedure: HEMOSTASIS CLIP PLACEMENT;  Surgeon: Gatha Mayer, MD;  [facility]: [facility];  Service: Endoscopy;;   HEMOSTASIS CLIP PLACEMENT  11/11/2021   Procedure: HEMOSTASIS CLIP PLACEMENT;  Surgeon: Lavena Bullion, DO;  [facility]: [facility];  Service: Gastroenterology;;   INGUINAL HERNIA REPAIR Right    KNEE SURGERY     OPEN REDUCTION INTERNAL FIXATION (ORIF) DISTAL RADIAL FRACTURE Right 10/29/2015   Procedure: OPEN REDUCTION INTERNAL FIXATION (ORIF) DISTAL RADIAL FRACTURE;  Surgeon: Leanora Cover, MD;  Location: [facility];  Service: Orthopedics;  Laterality: Right;   ORIF ELBOW FRACTURE Right    POLYPECTOMY  04/29/2020   Procedure: POLYPECTOMY;  Surgeon: Lavena Bullion, DO;  [facility];  Service: Gastroenterology;;   POLYPECTOMY  11/11/2021   Procedure: POLYPECTOMY;  Surgeon: Lavena Bullion, DO;  [facility];  Service: Gastroenterology;;   SUBMUCOSAL LIFTING INJECTION  10/02/2019   Procedure: SUBMUCOSAL LIFTING INJECTION;  Surgeon: Lavena Bullion, DO;  Location: [facility];  Service: Gastroenterology;;   TONSILLECTOMY     TOTAL KNEE ARTHROPLASTY Bilateral    TOTAL KNEE REVISION Right 08/25/2021   Procedure: Right knee polyethylene vs total knee arthroplasty revision;  Surgeon: Gaynelle Arabian, MD;  Location: [facility];  Service: Orthopedics;  Laterality: Right;   ULNAR COLLATERAL LIGAMENT REPAIR Right 12/29/2015   Procedure: REPAIR  RIGHT LATERAL ULNAR COLLATERAL LIGAMENT TEAR  EXTENSOR ORIGIN ;  Surgeon: Leanora Cover, MD;  Location: [facility];  Service: Orthopedics;  Laterality: Right;   WRIST ARTHROSCOPY WITH DEBRIDEMENT Right 07/14/2016   Procedure: RIGHT WRIST ARTHROSCOPY WITH DEBRIDEMENT  TRIANGULAR FIBROCARTILAGE COMPLEX;  Surgeon: Leanora Cover, MD;  Location: [facility];  Service: Orthopedics;  Laterality: Right;    Family History  Problem Relation Age of Onset   Alcohol abuse Mother    Ovarian cancer Mother    Alcohol abuse Father    Pancreatic cancer Father    Hyperlipidemia Brother    Barrett's esophagus Brother    Colon cancer Maternal Grandmother    Depression Daughter    Paranoid behavior Daughter    Esophageal cancer Neg Hx    Stomach cancer Neg Hx    Rectal cancer Neg Hx    Colon polyps Neg Hx     Allergies  Allergen Reactions   Penicillins Other (See Comments)    BLISTERS Did it involve swelling of the face/tongue/throat, SOB, or low BP? No Did it involve sudden or severe rash/hives, skin peeling, or any reaction on the inside of your mouth or nose? No Did you need to seek medical attention at a hospital or doctor's office? No When did it last happen? around 1980    If all above answers are "NO", may proceed with cephalosporin use. Tolerated Cephalosporin Date: 08/26/21.     Lisinopril Diarrhea and  Cough    Dry cough, abdominal bloating and diarrhea   Codeine Rash    Current Outpatient Medications on File Prior to Visit  Medication Sig Dispense Refill   albuterol (VENTOLIN HFA) 108 (90 Base) MCG/ACT inhaler Inhale 2 puffs into the lungs every 6 (six) hours as needed for wheezing or shortness of breath. 6.7 g 3  ARIPiprazole ER (ABILIFY MAINTENA) 400 MG PRSY prefilled syringe Inject 400 mg into the muscle every 28 (twenty-eight) days. 1 each 2   azelastine (ASTELIN) 0.1 % nasal spray Place 2 sprays into both nostrils 2 (two) times daily. Use in each nostril as directed 30 mL 6   benztropine (COGENTIN) 0.5 MG tablet Take 1 tablet (0.5 mg total) by mouth daily. 90 tablet 0   citalopram (CELEXA) 20 MG tablet Take 1 tablet (20 mg total) by mouth daily. 90 tablet 0   ezetimibe (ZETIA) 10 MG tablet TAKE ONE TABLET BY MOUTH AT NOON (Patient taking differently: Take 10 mg by mouth daily.) 90 tablet 3   fenofibrate (TRICOR) 145 MG tablet Take 1 tablet (145 mg total) by mouth daily. 90 tablet 3   icosapent Ethyl (VASCEPA) 1 g capsule Take 2 g by mouth 2 (two) times daily.     ipratropium-albuterol (DUONEB) 0.5-2.5 (3) MG/3ML SOLN Take 3 mLs by nebulization every 6 (six) hours as needed. (Patient taking differently: Take 3 mLs by nebulization every 6 (six) hours as needed (shortness of breath).) 360 mL 0   losartan (COZAAR) 50 MG tablet TAKE ONE TABLET BY MOUTH EVERY MORNING (Patient taking differently: Take 50 mg by mouth daily.) 90 tablet 1   Multiple Vitamin (MULTIVITAMIN WITH MINERALS) TABS tablet Take 1 tablet by mouth daily.     Multiple Vitamins-Minerals (PRESERVISION AREDS 2 PO) Take 1 capsule by mouth 2 (two) times daily.     naproxen sodium (ALEVE) 220 MG tablet Take 440 mg by mouth daily as needed (pain).     pantoprazole (PROTONIX) 40 MG tablet TAKE ONE TABLET BY MOUTH TWICE DAILY (Patient taking differently: 40 mg 2 (two) times daily.) 180 tablet 1   tamsulosin (FLOMAX) 0.4 MG CAPS  capsule Take 1 capsule (0.4 mg total) by mouth daily. 90 capsule 1   temazepam (RESTORIL) 30 MG capsule Take 1 capsule (30 mg total) by mouth at bedtime. 90 capsule 0   Vitamin D, Cholecalciferol, 1000 UNITS TABS Take 1,000 Units by mouth daily.      Current Facility-Administered Medications on File Prior to Visit  Medication Dose Route Frequency Provider Last Rate Last Admin   ARIPiprazole ER (ABILIFY MAINTENA) 400 MG prefilled syringe 400 mg  400 mg Intramuscular Q28 days Arfeen, Arlyce Harman, MD   400 mg at 02/17/22 1120    BP 130/60   Pulse 89   Temp 98.2 F (36.8 C) (Oral)   Ht '5\' 8"'$  (1.727 m)   Wt 268 lb (121.6 kg)   SpO2 95%   BMI 40.75 kg/m       Objective:   Physical Exam Vitals and nursing note reviewed.  Constitutional:      Appearance: Normal appearance. He is obese.  Cardiovascular:     Rate and Rhythm: Normal rate and regular rhythm.     Pulses: Normal pulses.     Heart sounds: Normal heart sounds.  Pulmonary:     Effort: Pulmonary effort is normal.     Breath sounds: Normal breath sounds.  Musculoskeletal:     Right lower leg: No edema.     Left lower leg: No edema.  Skin:    General: Skin is warm.  Neurological:     General: No focal deficit present.     Mental Status: He is alert and oriented to person, place, and time.  Psychiatric:        Mood and Affect: Mood normal.  Behavior: Behavior normal.        Thought Content: Thought content normal.       Assessment & Plan:  1. Acute cough -We will check chest x-ray today to look for signs of aspiration pneumonia.  I am not sure if his cough and shortness of breath is related to pulmonary or GI issue at this point in time.  He refuses to go to a pulmonologist. - DG Chest 2 View; Future - Will wait for chest xray to result before deciding on treatment plan   2. Shortness of breath   3. Dysphagia, unspecified type - Follow up with GI    [name], NP

## 2022-03-17 ENCOUNTER — Ambulatory Visit (HOSPITAL_BASED_OUTPATIENT_CLINIC_OR_DEPARTMENT_OTHER): Payer: PPO

## 2022-03-17 ENCOUNTER — Encounter (HOSPITAL_COMMUNITY): Payer: Self-pay

## 2022-03-17 VITALS — BP 124/69 | HR 82 | Temp 98.9°F | Ht 68.0 in | Wt 270.0 lb

## 2022-03-17 DIAGNOSIS — F319 Bipolar disorder, unspecified: Secondary | ICD-10-CM

## 2022-03-17 NOTE — Progress Notes (Signed)
Patient in today for due Abilify Maintena 400 mg IM injection.  Patient presented with appropriate affect, level mood and denied any auditory or visual hallucinations, no suicidal or homicidal ideations, and no plans or intent to want to harm self or others.  Patient reported some recent shortness of breath due to his asthma but now taking daily inhalers, per his PCP and also has a nebulizer at home if any emergency with breathing.  Patient's due Abilify Maintena 400 mg IM injection prepared as ordered and given to patient in his left upper outer quadrant.  Patient tolerated due injection without complaint of pain or discomfort and agreed to return in 4 weeks for next due injection.  Patient to call if any concerns or problems prior to next appointment.

## 2022-04-07 ENCOUNTER — Telehealth: Payer: Self-pay | Admitting: Pharmacist

## 2022-04-07 NOTE — Chronic Care Management (AMB) (Signed)
    Chronic Care Management Pharmacy Assistant   Name: Sean Hill  MRN: [id number] DOB: 05/29/1941  04/07/22 APPOINTMENT REMINDER  Patient was reminded to have all medications, supplements and any blood glucose and blood pressure readings available for review with Jeni Salles, Pharm. D, for telephone visit on 04/08/22 at 11:30.    Care Gaps: Bp- 130/60 03/10/22 AWV- 1/23  Star Rating Drug: Losartan (Cozaar) 50 mg - Last filled 02/03/22 90 DS at [facility]    Medications: Outpatient Encounter Medications as of 04/07/2022  Medication Sig   albuterol (VENTOLIN HFA) 108 (90 Base) MCG/ACT inhaler Inhale 2 puffs into the lungs every 6 (six) hours as needed for wheezing or shortness of breath.   ARIPiprazole ER (ABILIFY MAINTENA) 400 MG PRSY prefilled syringe Inject 400 mg into the muscle every 28 (twenty-eight) days.   azelastine (ASTELIN) 0.1 % nasal spray Place 2 sprays into both nostrils 2 (two) times daily. Use in each nostril as directed   benztropine (COGENTIN) 0.5 MG tablet Take 1 tablet (0.5 mg total) by mouth daily.   citalopram (CELEXA) 20 MG tablet Take 1 tablet (20 mg total) by mouth daily.   ezetimibe (ZETIA) 10 MG tablet TAKE ONE TABLET BY MOUTH AT NOON (Patient taking differently: Take 10 mg by mouth daily.)   fenofibrate (TRICOR) 145 MG tablet Take 1 tablet (145 mg total) by mouth daily.   icosapent Ethyl (VASCEPA) 1 g capsule Take 2 g by mouth 2 (two) times daily.   ipratropium-albuterol (DUONEB) 0.5-2.5 (3) MG/3ML SOLN Take 3 mLs by nebulization every 6 (six) hours as needed. (Patient taking differently: Take 3 mLs by nebulization every 6 (six) hours as needed (shortness of breath).)   losartan (COZAAR) 50 MG tablet TAKE ONE TABLET BY MOUTH EVERY MORNING (Patient taking differently: Take 50 mg by mouth daily.)   Multiple Vitamin (MULTIVITAMIN WITH MINERALS) TABS tablet Take 1 tablet by mouth daily.   Multiple Vitamins-Minerals (PRESERVISION AREDS 2 PO) Take 1 capsule  by mouth 2 (two) times daily.   naproxen sodium (ALEVE) 220 MG tablet Take 440 mg by mouth daily as needed (pain).   pantoprazole (PROTONIX) 40 MG tablet TAKE ONE TABLET BY MOUTH TWICE DAILY (Patient taking differently: 40 mg 2 (two) times daily.)   tamsulosin (FLOMAX) 0.4 MG CAPS capsule Take 1 capsule (0.4 mg total) by mouth daily.   temazepam (RESTORIL) 30 MG capsule Take 1 capsule (30 mg total) by mouth at bedtime.   Vitamin D, Cholecalciferol, 1000 UNITS TABS Take 1,000 Units by mouth daily.    Facility-Administered Encounter Medications as of 04/07/2022  Medication   ARIPiprazole ER (ABILIFY MAINTENA) 400 MG prefilled syringe 400 mg    [place] Clinical Pharmacist Assistant [phone]

## 2022-04-07 NOTE — Progress Notes (Unsigned)
Chronic Care Management Pharmacy Note  04/11/2022 Name:  Sean Hill MRN:  [id number] DOB:  1940-10-02  Summary: TGs not at goal < 150 BP is at goal < 140/90 Pt is having SOB with exertion and fluid retention  Recommendations/Changes made from today's visit: -Recommend repeat lipid panel  -Recommended restarting home BP monitoring at least weekly -Recommended reconsidering pulmonary referral -Consider Echo to assess heart function given current symptoms  Plan: Scheduled CPE in 1 month Follow up in 4 months  Subjective: Sean Hill is an 81 y.o. year old male who is a primary patient of Sean Peng, NP.  The CCM team was consulted for assistance with disease management and care coordination needs.    Engaged with patient by telephone for follow up visit in response to provider referral for pharmacy case management and/or care coordination services.   Consent to Services:  The patient was given information about Chronic Care Management services, agreed to services, and gave verbal consent prior to initiation of services.  Please see initial visit note for detailed documentation.   Patient Care Team: Sean Peng, NP as PCP - General (Family Medicine) Sean Cover, MD as Consulting Physician (Orthopedic Surgery) Sean Hill, Sean Hill as Pharmacist (Pharmacist)  Recent office visits: 03/10/22 Sean Peng, NP: Patient presented for acute cough and SOB. Chest x-ray did not show any abnormalities. Patient does not wish to follow up with pulmonary.  02/17/22 Sean Peng, NP: Patient presented for sinus problem and cough. Provided samples of Breztri. Prescribed doxycycline, prednisone x 3 days and Duoneb.  12/21/21 Sean Peng, NP - Patient presented for Hyperlipidemia and other concerns. Stopped Oxymetazoline.  10/26/21 Sean Peng, NP - Patient presented for Acute non recurrent pansinusitis and other concerns. Prescribed Doxycycline Hyclate and Tamsulosin.  Stopped Cetrizine.   09/23/21 Sean Peaches, LPN - Patient presented for Medicare Annual wellness exam. Stopped Doxycycline, Hydrocodone, Meclizine, Methocarbamol, Oxycodone and Prednisone.  Recent consult visits: 03/17/22 Sean Arbour, RN (psychiatry): Patient presented for Abilify injection.   01/27/22 Sean Hill [facility]) - Patient presented via video for Bipolar disorder and other concerns. No visit details available.  10/28/21 Sean Hill - Patient presented for Generalized anxiety disorder. No other visit details available.   10/05/21 [place], Sean Cherry, LCSW  - Patient presented for Depression. No medication changes noted.  Hospital visits: 11/11/20 Patient admitted to [facility] for 3 hours for a colonoscopy for polyp surveillance.   Medication Reconciliation was completed by comparing discharge summary, patient's EMR and Pharmacy list, and upon discussion with patient.   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 Patient was present for 30 hours.   New?Medications Started at [facility] Discharge:?? -started  aspirin methocarbamol (ROBAXIN) oxyCODONE (Oxy IR/ROXICODONE)   Medication Changes at Hospital Discharge: -Changed    Medications Discontinued at Hospital Discharge: -Stopped  HYDROcodone bit-homatropine 5-1.5 MG/5ML syrup (HYCODAN) predniSONE 20 MG tablet (DELTASONE)   Medications that remain the same after Hospital Discharge:??  -All other medications will remain the same.    Objective:  Lab Results  Component Value Date   CREATININE 1.21 12/21/2021   BUN 17 12/21/2021   GFR 57.14 (L) 12/21/2021   GFRNONAA >60 08/26/2021   GFRAA >60 10/09/2019   NA 141 12/21/2021   K 3.9 12/21/2021   CALCIUM 9.9 12/21/2021   CO2 28 12/21/2021    Lab Results  Component Value Date/Time   HGBA1C 6.0 (H) 08/16/2021 09:40 AM  HGBA1C 6.0 04/29/2021 11:06 AM   GFR 57.14 (L) 12/21/2021  09:01 AM   GFR 83.60 04/29/2021 11:06 AM   MICROALBUR 1.5 11/28/2011 04:28 PM    Last diabetic Eye exam: No results found for: "HMDIABEYEEXA"  Last diabetic Foot exam: No results found for: "HMDIABFOOTEX"   Lab Results  Component Value Date   CHOL 156 12/21/2021   HDL 30.40 (L) 12/21/2021   LDLDIRECT 91.0 12/21/2021   TRIG 227.0 (H) 12/21/2021   CHOLHDL 5 12/21/2021       Latest Ref Rng & Units 08/16/2021    9:40 AM 04/29/2021   11:06 AM 08/07/2020   12:25 PM  Hepatic Function  Total Protein 6.5 - 8.1 g/dL 6.8  6.6  7.2   Albumin 3.5 - 5.0 g/dL 4.1  4.1  3.9   AST 15 - 41 U/L 43  35  29   ALT 0 - 44 U/L 22  31  20    Alk Phosphatase 38 - 126 U/L 71  91  63   Total Bilirubin 0.3 - 1.2 mg/dL 0.8  0.7  1.0     Lab Results  Component Value Date/Time   TSH 1.90 04/29/2021 11:06 AM   TSH 2.30 12/25/2019 08:23 AM   FREET4 0.89 07/21/2011 06:52 PM       Latest Ref Rng & Units 08/26/2021    3:37 AM 08/16/2021    9:40 AM 05/23/2021    6:27 PM  CBC  WBC 4.0 - 10.5 K/uL 12.7  4.4  7.2   Hemoglobin 13.0 - 17.0 g/dL 13.7  14.9  15.4   Hematocrit 39.0 - 52.0 % 41.2  44.4  44.7   Platelets 150 - 400 K/uL 219  233  215     No results found for: "VD25OH"  Clinical ASCVD: No  The 10-year ASCVD risk score (Arnett DK, et al., 2019) is: 35.9%   Values used to calculate the score:     Age: 81 years     Sex: Male     Is Non-Hispanic African American: No     Diabetic: No     Tobacco smoker: No     Systolic Blood Pressure: 511 mmHg     Is BP treated: Yes     HDL Cholesterol: 30.4 mg/dL     Total Cholesterol: 156 mg/dL       02/17/2022    2:05 PM 09/23/2021    1:59 PM 09/25/2020    2:57 PM  Depression screen PHQ 2/9  Decreased Interest 1 0 0  Down, Depressed, Hopeless 0 0 0  PHQ - 2 Score 1 0 0  Altered sleeping 1    Tired, decreased energy 3    Change in appetite 3    Feeling bad or failure about yourself  0    Trouble concentrating 0    Moving slowly or fidgety/restless 0     Suicidal thoughts 0    PHQ-9 Score 8    Difficult doing work/chores Somewhat difficult        Social History   Tobacco Use  Smoking Status Never  Smokeless Tobacco Never   BP Readings from Last 3 Encounters:  03/17/22 124/69  03/10/22 130/60  02/17/22 120/60   Pulse Readings from Last 3 Encounters:  03/17/22 82  03/10/22 89  02/17/22 73   Wt Readings from Last 3 Encounters:  03/17/22 270 lb (122.5 kg)  03/10/22 268 lb (121.6 kg)  02/17/22 268 lb (121.6 kg)    Assessment/Interventions: Review  of patient past medical history, allergies, medications, health status, including review of consultants reports, laboratory and other test data, was performed as part of comprehensive evaluation and provision of chronic care management services.   SDOH:  (Social Determinants of Health) assessments and interventions performed: No   CCM Care Plan  Allergies  Allergen Reactions   Penicillins Other (See Comments)    BLISTERS Did it involve swelling of the face/tongue/throat, SOB, or low BP? No Did it involve sudden or severe rash/hives, skin peeling, or any reaction on the inside of your mouth or nose? No Did you need to seek medical attention at a hospital or doctor's office? No When did it last happen? around 1980    If all above answers are "NO", may proceed with cephalosporin use. Tolerated Cephalosporin Date: 08/26/21.     Lisinopril Diarrhea and Cough    Dry cough, abdominal bloating and diarrhea   Codeine Rash    Medications Reviewed Today     Reviewed by Sean Hill, [facility] (Pharmacist) on 04/08/22 at 1223  Med List Status: <None>   Medication Order Taking? Sig Documenting Provider Last Dose Status Informant  albuterol (VENTOLIN HFA) 108 (90 Base) MCG/ACT inhaler [id number]  Inhale 2 puffs into the lungs every 6 (six) hours as needed for wheezing or shortness of breath. Nafziger, Tommi Rumps, NP  Active Self  ARIPiprazole ER (ABILIFY MAINTENA) 400 MG prefilled syringe  400 mg [id number]    Patient taking differently: Inject 400 mg into the muscle every 28 (twenty-eight) days. Reconstitute with the attached diluent before use.   [name], MD  Active   ARIPiprazole ER (ABILIFY MAINTENA) 400 MG PRSY prefilled syringe [id number]  Inject 400 mg into the muscle every 28 (twenty-eight) days. [name], MD  Active   azelastine (ASTELIN) 0.1 % nasal spray [id number]  Place 2 sprays into both nostrils 2 (two) times daily. Use in each nostril as directed Nafziger, Tommi Rumps, NP  Active   benztropine (COGENTIN) 0.5 MG tablet [id number]  Take 1 tablet (0.5 mg total) by mouth daily. [name], MD  Active   citalopram (CELEXA) 20 MG tablet [id number]  Take 1 tablet (20 mg total) by mouth daily. [name], MD  Active   ezetimibe (ZETIA) 10 MG tablet [id number]  TAKE ONE TABLET BY MOUTH AT NOON  Patient taking differently: Take 10 mg by mouth daily.   Nafziger, Tommi Rumps, NP  Active Self  fenofibrate (TRICOR) 145 MG tablet [id number]  Take 1 tablet (145 mg total) by mouth daily. Nafziger, Tommi Rumps, NP  Active Self  icosapent Ethyl (VASCEPA) 1 g capsule [id number]  Take 2 g by mouth 2 (two) times daily. [provider]  Active   ipratropium-albuterol (DUONEB) 0.5-2.5 (3) MG/3ML SOLN [id number] Yes Take 3 mLs by nebulization every 6 (six) hours as needed.  Patient taking differently: Take 3 mLs by nebulization every 6 (six) hours as needed (shortness of breath).   Nafziger, Tommi Rumps, NP Taking Active Self  losartan (COZAAR) 50 MG tablet [id number]  TAKE ONE TABLET BY MOUTH EVERY MORNING  Patient taking differently: Take 50 mg by mouth daily.   Nafziger, Tommi Rumps, NP  Active Self  Multiple Vitamin (MULTIVITAMIN WITH MINERALS) TABS tablet [id number]  Take 1 tablet by mouth daily. [provider]  Active Self  Multiple Vitamins-Minerals (PRESERVISION AREDS 2 PO) [id number]  Take 1 capsule by mouth 2 (two) times daily. [provider]  Active Self  naproxen sodium  (ALEVE) 220 MG tablet [id number]  Take 440 mg by mouth daily as needed (pain). [provider]  Active Self  pantoprazole (PROTONIX) 40 MG tablet [id number]  TAKE ONE TABLET BY MOUTH TWICE DAILY  Patient taking differently: 40 mg 2 (two) times daily.   Cirigliano, Vito V, DO  Active Self  tamsulosin (FLOMAX) 0.4 MG CAPS capsule [id number]  Take 1 capsule (0.4 mg total) by mouth daily. Nafziger, Tommi Rumps, NP  Active   temazepam (RESTORIL) 30 MG capsule [id number]  Take 1 capsule (30 mg total) by mouth at bedtime. [name], MD  Active   Vitamin D, Cholecalciferol, 1000 UNITS TABS [id number]  Take 1,000 Units by mouth daily.  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   Bipolar affective disorder, currently depressed, mild ([place]) 10/21/2019   Hemorrhage of colon following colonoscopy 10/08/2019   Adenomatous polyp of ascending colon    Tubulovillous adenoma of colon    History of colonic polyps    Diverticulosis of colon without hemorrhage    Grade I internal hemorrhoids    Essential hypertension 08/23/2016   Left bundle branch block (LBBB) on electrocardiogram 06/10/2016   Hyperlipidemia 06/07/2016   Left leg swelling 01/06/2015   Change in bowel habits 01/06/2015   Gastroesophageal reflux disease 11/28/2011   Bipolar disorder ([place]) 11/25/2011    Immunization History  Administered Date(s) Administered   Fluad Quad(high Dose 65+) 05/29/2019, 06/04/2021   Influenza Inj Mdck Quad Pf 05/29/2019   Influenza, High Dose Seasonal PF 06/07/2016, 05/15/2017   Influenza-Unspecified 07/20/2013, 06/16/2020   Moderna Covid-19 Vaccine Bivalent Booster 22yr & up 01/17/2022   PFIZER Comirnaty(Gray Top)Covid-19 Tri-Sucrose Vaccine 01/29/2021    PFIZER(Purple Top)SARS-COV-2 Vaccination 10/25/2019, 11/19/2019, 06/16/2020   Pfizer Covid-19 Vaccine Bivalent Booster 168yr& up 06/09/2021   Pneumococcal Conjugate-13 03/11/2015   Pneumococcal Polysaccharide-23 07/06/2010   Tdap 07/06/2010, 10/29/2015   Zoster Recombinat (Shingrix) 01/29/2021, 05/18/2021   He isn't doing PT right now as they discharged him and he needs to do a lot of exercise. He hasn't been doing as much as he needs to. He has been doing some walking. His wife goes with him with walking.  Patient reports he is still having SOB and some edema. He is elevating his feet for the edema but is not using compression stockings. He denies any recent changes with diet and exercise that could cause swelling. Discussed benefits of following up with pulmonary but patient declines as this time and will wait till after he sees gastroenterologist for difficulty swallowing.  Patient reports the SOB is more when he exerts himself. He walks from in the house to the car and he is out of breath. This has been going on for 6 months.   He has been put to sleep a lot over the last couple of years. He thinks he is having shoulder problems. He gets intubated when he has anesthesia and was wondering if this was related to his breathing. He has had 2 knee and ankle fusions and a bone graft. He was wondering if this could worsen his breathing. Patient is also not active but could start walking with his wife.   Conditions to be addressed/monitored:  Hypertension, Hyperlipidemia, Diabetes, GERD, Allergic Rhinitis and insomnia, bipolar  disorder  Conditions addressed this visit: Hypertension, hyperlipidemia  Care Plan : CCM Pharmacy Care Plan  Updates made by Sean Hill, [place] since 04/11/2022 12:00 AM     Problem: Problem: Hypertension, Hyperlipidemia, Diabetes, GERD, Allergic Rhinitis and insomnia, bipolar disorder      Long-Range Goal: Patient-Specific Goal   Start Date: 11/11/2020  Expected  End Date: 11/11/2021  Recent Progress: On track  Priority: High  Note:   Current Barriers:  Unable to independently monitor therapeutic efficacy Unable to achieve control of triglycerides  Does not contact provider office for questions/concerns  Pharmacist Clinical Goal(s):  Patient will achieve adherence to monitoring guidelines and medication adherence to achieve therapeutic efficacy through collaboration with PharmD and provider.    Interventions: 1:1 collaboration with Sean Peng, NP regarding development and update of comprehensive plan of care as evidenced by provider attestation and co-signature Inter-disciplinary care team collaboration (see longitudinal plan of care) Comprehensive medication review performed; medication list updated in electronic medical record  Hypertension (BP goal <140/90) -Uncontrolled -Current treatment: Losartan 50 mg 1 tablet daily - Appropriate, Query effective, Safe, Accessible -Medications previously tried: lisinopril (cough) -Current home readings: 130/70, 120/70; checking once a week; arm cuff -Current dietary habits:  tries to avoid using salt with foods; drinks 1-2 cups of coffee in the morning -Current exercise habits:  patient does not do any structured exercise due to shoulder pain and double knee replacements; patient can walk and would like to start being more active -Denies hypotensive/hypertensive symptoms -Educated on BP goals and benefits of medications for prevention of heart attack, stroke and kidney damage; Exercise goal of 150 minutes per week; Importance of home blood pressure monitoring; -Counseled to monitor BP at home weekly, document, and provide log at future appointments -Counseled on diet and exercise extensively Recommended to continue current medication Recommended setting a goal for exercise he is able to accomplish such as 15 minutes 2-3 days a week Recommended bringing home BP monitor to next office  visit  Hyperlipidemia: (LDL goal < 100) -Controlled; uncontrolled triglycerides -Current treatment: Ezetimibe 10 mg 1 tablet daily - Appropriate, Query effective, Safe, Accessible Vascepa 1 g 2 capsules twice daily - Appropriate, Query effective, Safe, Accessible Fenofibrate 160 mg 1 tablet daily - Appropriate, Query effective, Safe, Accessible -Medications previously tried: statins  -Current dietary patterns: tries to avoid fatty foods; eating more sweets lately and does not drink alcohol -Current exercise habits: patient does not do any structured exercise due to shoulder pain and double knee replacements; patient can walk and would like to start being more active -Educated on Cholesterol goals;  Importance of limiting foods high in cholesterol; Exercise goal of 150 minutes per week; -Recommended to continue current medication  Pre-diabetes (A1c goal <6.5%) -Controlled -Current medications: No medications -Medications previously tried: none  -Current home glucose readings fasting glucose: none post prandial glucose: none -Denies hypoglycemic/hyperglycemic symptoms -Current meal patterns:  breakfast: did not discuss  lunch: did not discuss  dinner: did not discuss snacks: did not discuss drinks: did not discuss -Current exercise: patient does not do any structured exercise due to shoulder pain and double knee replacements; patient can walk and would like to start being more active -Educated on Carbohydrate counting and/or plate method -Counseled to check feet daily and get yearly eye exams -Counseled on diet and exercise extensively Provided healthy plate handout  GERD (Goal: minimize symptoms of heartburn/acid reflux) -Controlled -Current treatment  Pantoprazole 69m, 1 tablet twice daily - Appropriate, Effective, Safe, Accessible -Medications previously  tried: none  -Recommended to continue current medication  Bipolar disorder (Goal: minimize  symptoms) -Controlled -Current treatment  Aripiprazole ER 493m, inject 4066mevery twenty-eight days  - Appropriate, Effective, Safe, Accessible Citalopram 2082m1 tablet once daily - Appropriate, Effective, Safe, Accessible -Medications previously tried: unknown  -Recommended to continue current medication  Sinus congestion (Goal: minimize symptoms of congestion) -Uncontrolled -Current treatment  Fluticasone nasal spray, 1 spray into both nostrils in the morning and at bedtime  - Appropriate, Effective, Safe, Accessible Zyrtec 10 mg daily - Appropriate, Effective, Safe, Accessible -Medications previously tried: chlopheniramine (switched to safer alternative Zyrtec) -Recommended to continue current medication  Insomnia (Goal: improve quality and quantity of sleep) -Controlled -Current treatment  Temazepam 33m17m capsule at bedtime - Appropriate, Effective, Query Safe, Accessible -Medications previously tried: several medications, trazodone (ineffective) -Recommended to continue current medication Counseled on practicing good sleep hygiene by setting a sleep schedule and maintaining it, avoid excessive napping, following a nightly routine, avoiding screen time for 30-60 minutes before going to bed, and making the bedroom a cool, quiet and dark space  Recommended trial of melatonin timed release to help with staying asleep  Health Maintenance -Vaccine gaps: none -Current therapy:  Multivitamin, 1 tablet once daily Preservision AREDS 2, 1 capsule twice daily Vitamin D3, 1,000 units once daily  -Educated on Cost vs benefit of each product must be carefully weighed by individual consumer -Patient is satisfied with current therapy and denies issues -Recommended to continue current medication   Patient Goals/Self-Care Activities Patient will:  - take medications as prescribed check blood pressure weekly, document, and provide at future appointments target a minimum of 150 minutes  of moderate intensity exercise weekly  Follow Up Plan: Telephone follow up appointment with care management team member scheduled for: 4 months       Medication Assistance: None required.  Patient affirms current coverage meets needs.  Compliance/Adherence/Medication fill history: Care Gaps: BP- 130/60 03/10/22  Star-Rating Drugs: Losartan (Cozaar) 50 mg - Last filled 02/03/22 90 DS at [facility]  Patient's preferred pharmacy is:  [facility] [id number] [place]: [phone]: [phone] [id number]: [phone]: [phone] pill box? No - adherence packaging Pt endorses 100% compliance  We discussed: Benefits of medication synchronization, packaging and delivery as well as enhanced pharmacist oversight with [facility]. Patient decided to: Utilize [facility] pharmacy for medication synchronization, packaging and delivery  Care Plan and Follow Up Patient Decision:  Patient agrees to Care Plan and Follow-up.  Plan: Telephone follow up appointment with care management team member scheduled for:  4 months  MadeJeni SallesarmD BCACCentervillermacist [phone]

## 2022-04-08 ENCOUNTER — Ambulatory Visit (INDEPENDENT_AMBULATORY_CARE_PROVIDER_SITE_OTHER): Payer: PPO | Admitting: Pharmacist

## 2022-04-08 DIAGNOSIS — E7849 Other hyperlipidemia: Secondary | ICD-10-CM

## 2022-04-08 DIAGNOSIS — I10 Essential (primary) hypertension: Secondary | ICD-10-CM

## 2022-04-11 NOTE — Patient Instructions (Signed)
Hi Sean Hill,  It was great to catch up with you again! I hope you feel better soon. Let Tommi Rumps know if you want to see the pulmonologist after you see the gastroenterologist if you are still having shortness of breath.  Please reach out to me if you have any questions or need anything!  Best, Maddie  Jeni Salles, PharmD, [facility] [place]   Visit Information   Goals Addressed   None    Patient Care Plan: CCM Pharmacy Care Plan     Problem Identified: Problem: Hypertension, Hyperlipidemia, Diabetes, GERD, Allergic Rhinitis and insomnia, bipolar disorder      Long-Range Goal: Patient-Specific Goal   Start Date: 11/11/2020  Expected End Date: 11/11/2021  Recent Progress: On track  Priority: High  Note:   Current Barriers:  Unable to independently monitor therapeutic efficacy Unable to achieve control of triglycerides  Does not contact provider office for questions/concerns  Pharmacist Clinical Goal(s):  Patient will achieve adherence to monitoring guidelines and medication adherence to achieve therapeutic efficacy through collaboration with PharmD and provider.    Interventions: 1:1 collaboration with [name], NP regarding development and update of comprehensive plan of care as evidenced by provider attestation and co-signature Inter-disciplinary care team collaboration (see longitudinal plan of care) Comprehensive medication review performed; medication list updated in electronic medical record  Hypertension (BP goal <140/90) -Uncontrolled -Current treatment: Losartan 50 mg 1 tablet daily - Appropriate, Query effective, Safe, Accessible -Medications previously tried: lisinopril (cough) -Current home readings: 130/70, 120/70; checking once a week; arm cuff -Current dietary habits:  tries to avoid using salt with foods; drinks 1-2 cups of coffee in the morning -Current exercise habits:  patient does not do any  structured exercise due to shoulder pain and double knee replacements; patient can walk and would like to start being more active -Denies hypotensive/hypertensive symptoms -Educated on BP goals and benefits of medications for prevention of heart attack, stroke and kidney damage; Exercise goal of 150 minutes per week; Importance of home blood pressure monitoring; -Counseled to monitor BP at home weekly, document, and provide log at future appointments -Counseled on diet and exercise extensively Recommended to continue current medication Recommended setting a goal for exercise he is able to accomplish such as 15 minutes 2-3 days a week Recommended bringing home BP monitor to next office visit  Hyperlipidemia: (LDL goal < 100) -Controlled; uncontrolled triglycerides -Current treatment: Ezetimibe 10 mg 1 tablet daily - Appropriate, Query effective, Safe, Accessible Vascepa 1 g 2 capsules twice daily - Appropriate, Query effective, Safe, Accessible Fenofibrate 160 mg 1 tablet daily - Appropriate, Query effective, Safe, Accessible -Medications previously tried: statins  -Current dietary patterns: tries to avoid fatty foods; eating more sweets lately and does not drink alcohol -Current exercise habits: patient does not do any structured exercise due to shoulder pain and double knee replacements; patient can walk and would like to start being more active -Educated on Cholesterol goals;  Importance of limiting foods high in cholesterol; Exercise goal of 150 minutes per week; -Recommended to continue current medication  Pre-diabetes (A1c goal <6.5%) -Controlled -Current medications: No medications -Medications previously tried: none  -Current home glucose readings fasting glucose: none post prandial glucose: none -Denies hypoglycemic/hyperglycemic symptoms -Current meal patterns:  breakfast: did not discuss  lunch: did not discuss  dinner: did not discuss snacks: did not discuss drinks:  did not discuss -Current exercise: patient does not do any structured exercise due to shoulder pain and double knee  replacements; patient can walk and would like to start being more active -Educated on Carbohydrate counting and/or plate method -Counseled to check feet daily and get yearly eye exams -Counseled on diet and exercise extensively Provided healthy plate handout  GERD (Goal: minimize symptoms of heartburn/acid reflux) -Controlled -Current treatment  Pantoprazole '40mg'$ , 1 tablet twice daily - Appropriate, Effective, Safe, Accessible -Medications previously tried: none  -Recommended to continue current medication  Bipolar disorder (Goal: minimize symptoms) -Controlled -Current treatment  Aripiprazole ER '400mg'$ , inject '400mg'$  every twenty-eight days  - Appropriate, Effective, Safe, Accessible Citalopram '20mg'$ , 1 tablet once daily - Appropriate, Effective, Safe, Accessible -Medications previously tried: unknown  -Recommended to continue current medication  Sinus congestion (Goal: minimize symptoms of congestion) -Uncontrolled -Current treatment  Fluticasone nasal spray, 1 spray into both nostrils in the morning and at bedtime  - Appropriate, Effective, Safe, Accessible Zyrtec 10 mg daily - Appropriate, Effective, Safe, Accessible -Medications previously tried: chlopheniramine (switched to safer alternative Zyrtec) -Recommended to continue current medication  Insomnia (Goal: improve quality and quantity of sleep) -Controlled -Current treatment  Temazepam '30mg'$ , 1 capsule at bedtime - Appropriate, Effective, Query Safe, Accessible -Medications previously tried: several medications, trazodone (ineffective) -Recommended to continue current medication Counseled on practicing good sleep hygiene by setting a sleep schedule and maintaining it, avoid excessive napping, following a nightly routine, avoiding screen time for 30-60 minutes before going to bed, and making the bedroom a cool,  quiet and dark space  Recommended trial of melatonin timed release to help with staying asleep  Health Maintenance -Vaccine gaps: none -Current therapy:  Multivitamin, 1 tablet once daily Preservision AREDS 2, 1 capsule twice daily Vitamin D3, 1,000 units once daily  -Educated on Cost vs benefit of each product must be carefully weighed by individual consumer -Patient is satisfied with current therapy and denies issues -Recommended to continue current medication   Patient Goals/Self-Care Activities Patient will:  - take medications as prescribed check blood pressure weekly, document, and provide at future appointments target a minimum of 150 minutes of moderate intensity exercise weekly  Follow Up Plan: Telephone follow up appointment with care management team member scheduled for: 4 months       Patient verbalizes understanding of instructions and care plan provided today and agrees to view in [place]. Active MyChart status and patient understanding of how to access instructions and care plan via MyChart confirmed with patient.    Telephone follow up appointment with pharmacy team member scheduled for: 4 months  Viona Gilmore, [facility]

## 2022-04-12 ENCOUNTER — Other Ambulatory Visit: Payer: Self-pay | Admitting: Adult Health

## 2022-04-12 DIAGNOSIS — R051 Acute cough: Secondary | ICD-10-CM

## 2022-04-13 DIAGNOSIS — M25512 Pain in left shoulder: Secondary | ICD-10-CM | POA: Diagnosis not present

## 2022-04-18 ENCOUNTER — Encounter: Payer: Self-pay | Admitting: Gastroenterology

## 2022-04-18 ENCOUNTER — Ambulatory Visit: Payer: PPO | Admitting: Gastroenterology

## 2022-04-18 VITALS — BP 138/78 | HR 74 | Ht 69.5 in | Wt 267.0 lb

## 2022-04-18 DIAGNOSIS — E7849 Other hyperlipidemia: Secondary | ICD-10-CM

## 2022-04-18 DIAGNOSIS — Z86010 Personal history of colonic polyps: Secondary | ICD-10-CM

## 2022-04-18 DIAGNOSIS — I10 Essential (primary) hypertension: Secondary | ICD-10-CM

## 2022-04-18 DIAGNOSIS — K219 Gastro-esophageal reflux disease without esophagitis: Secondary | ICD-10-CM | POA: Diagnosis not present

## 2022-04-18 DIAGNOSIS — R1319 Other dysphagia: Secondary | ICD-10-CM | POA: Diagnosis not present

## 2022-04-18 DIAGNOSIS — T884XXD Failed or difficult intubation, subsequent encounter: Secondary | ICD-10-CM

## 2022-04-18 NOTE — Patient Instructions (Addendum)
If you are age 79 or older, your body mass index should be between 23-30. Your Body mass index is 38.86 kg/m. If this is out of the aforementioned range listed, please consider follow up with your Primary Care Provider.  ________________________________________________________  The [facility] providers would like to encourage you to use [facility] to communicate with providers for non-urgent requests or questions.  Due to long hold times on the telephone, sending your provider a message by [facility] may be a faster and more efficient way to get a response.  Please allow 48 business hours for a response.  Please remember that this is for non-urgent requests.  _______________________________________________________  Due to recent changes in healthcare laws, you may see the results of your imaging and laboratory studies on MyChart before your provider has had a chance to review them.  We understand that in some cases there may be results that are confusing or concerning to you. Not all laboratory results come back in the same time frame and the provider may be waiting for multiple results in order to interpret others.  Please give [place] 48 hours in order for your provider to thoroughly review all the results before contacting the office for clarification of your results.   You have been scheduled for an endoscopy at [facility]. Please follow written instructions given to you at your visit today. If you use inhalers (even only as needed), please bring them with you on the day of your procedure.   Thank you for choosing me and [facility].  Vito Cirigliano, D.O.

## 2022-04-18 NOTE — Progress Notes (Signed)
Chief Complaint:    Dysphagia  GI History: 81 year old male with history of anxiety, arthritis, asthma, bipolar disorder, depression, difficult airway for intubation, GERD, history of colon polyps, nephrolithiasis, HTN, HLD, LBBB, macular degeneration.  Endoscopic history: - Positive Cologuard in 2016 -Colonoscopy (08/2019, Dr. 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 Bryan Lemma): 3 cm HH, irregular Z-line (biopsy: Reflux changes without intestinal metaplasia), Gastric hyperplastic polyps.  Empiric dilation with 51 Fr Savary -CT abdomen/pelvis (09/30/2019): No discrete mass.  No lymphadenopathy. -Colonoscopy (10/02/2019, Dr. Bryan Lemma): EMR of 25 mm polyp in ascending colon (TVA), left-sided diverticulosis, internal hemorrhoids -Admitted January 19-21, 2021 with post polypectomy bleed.  Hemodynamically stable, stable hemoglobin.  No blood products needed -Colonoscopy (10/09/2019, Dr. Carlean Purl): 20 mm ulcer in proximal ascending colon without active bleeding but positive stigmata.  Treated with 11 clips.  Difficult positioning and significant respiratory-dependent motion in colon. - Colonoscopy (04/29/2020): Tattoo in ascending colon.  Single retained clip in proximal ascending colon 1 cm away from post polypectomy scar.  Underlying tissue with polypoid appearance (path: Inflammatory tissue).  Subtle nodularity around post polypectomy site (path:).  2 mm ascending colon adenoma, 3 mm sigmoid adenoma.  Sigmoid diverticulosis, internal hemorrhoids.  Recommended repeat in 1 year - Colonoscopy (11/11/2021): 5 mm transverse colon polyp (path: Tubular adenoma), Diverticulosis, tattoo and post polypectomy scar in ascending colon.  Repeat 3 years  HPI:     Patient is a 81 y.o. male presenting to the Gastroenterology Clinic for evaluation of dysphagia.  Symptoms similar to 2020  when he had upper endoscopy with empiric esophageal dilation with significant improvement in symptoms. Sxs started to slowly recur over the last year. Worse with rice, popcorn. Points to mid sternum as site of perceived dysphagia. Will cough and can sometimes bring food back up. Occurs 3-4 days/week now.  O/w, GERD well controlled with Protonix 40 mg BID; No breakthrough heartburn, regurgitation.   No melena, hematochezia.   Due to history of difficult airway and Mallampati 4, procedures need to be scheduled at [facility] Endoscopy unit.  No recent new abdominal imaging for review.     Latest Ref Rng & Units 12/21/2021    9:01 AM 08/26/2021    3:37 AM 08/16/2021    9:40 AM  BMP  Glucose 70 - 99 mg/dL 107  186  119   BUN 6 - 23 mg/dL '17  17  18   '$ Creatinine 0.40 - 1.50 mg/dL 1.21  0.98  0.91   Sodium 135 - 145 mEq/L 141  139  138   Potassium 3.5 - 5.1 mEq/L 3.9  4.2  4.3   Chloride 96 - 112 mEq/L 106  107  105   CO2 19 - 32 mEq/L '28  23  23   '$ Calcium 8.4 - 10.5 mg/dL 9.9  8.8  9.2       Latest Ref Rng & Units 08/26/2021    3:37 AM 08/16/2021    9:40 AM 05/23/2021    6:27 PM  CBC  WBC 4.0 - 10.5 K/uL 12.7  4.4  7.2   Hemoglobin 13.0 - 17.0 g/dL 13.7  14.9  15.4   Hematocrit 39.0 - 52.0 % 41.2  44.4  44.7   Platelets 150 - 400 K/uL 219  233  215      Review of systems:     No chest pain, no SOB, no fevers, no urinary sx  Past Medical History:  Diagnosis Date   Anal fissure    Anxiety    Arthritis    right wrist; pt. 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 x 1 yr.   Impaired hearing    Left bundle branch block (LBBB) on  electrocardiogram 10/29/2015   Macular degeneration (senile) of retina    left   Pre-diabetes    Wears glasses     Patient's surgical history, family medical history, social history, medications and allergies were all reviewed in Epic    Current Outpatient Medications  Medication Sig Dispense Refill   albuterol (VENTOLIN HFA) 108 (90 Base) MCG/ACT inhaler Inhale 2 puffs into the lungs every 6 (six) hours as needed for wheezing or shortness of breath. 6.7 g 3   ARIPiprazole ER (ABILIFY MAINTENA) 400 MG PRSY prefilled syringe Inject 400 mg into the muscle every 28 (twenty-eight) days. 1 each 2   azelastine (ASTELIN) 0.1 % nasal spray Place 2 sprays into both nostrils 2 (two) times daily. Use in each nostril as directed 30 mL 6   benztropine (COGENTIN) 0.5 MG tablet Take 1 tablet (0.5 mg total) by mouth daily. 90 tablet 0   citalopram (CELEXA) 20 MG tablet Take 1 tablet (20 mg total) by mouth daily. 90 tablet 0   ezetimibe (ZETIA) 10 MG tablet TAKE ONE TABLET BY MOUTH AT NOON (Patient taking differently: Take 10 mg by mouth daily.) 90 tablet 3   fenofibrate (TRICOR) 145 MG tablet Take 1 tablet (145 mg total) by mouth daily. 90 tablet 3   icosapent Ethyl (VASCEPA) 1 g capsule Take 2 g by mouth 2 (two) times daily.     ipratropium-albuterol (DUONEB) 0.5-2.5 (3) MG/3ML SOLN Take 3 mLs by nebulization every 6 (six) hours as needed. (Patient taking differently: Take 3 mLs by nebulization every 6 (six) hours as needed (shortness of breath).) 360 mL 0   losartan (COZAAR) 50 MG tablet TAKE ONE TABLET BY MOUTH EVERY MORNING (Patient taking differently: Take 50 mg by mouth daily.) 90 tablet 1   Multiple Vitamin (MULTIVITAMIN WITH MINERALS) TABS tablet Take 1 tablet by mouth daily.     Multiple Vitamins-Minerals (PRESERVISION AREDS 2 PO) Take 1 capsule by mouth 2 (two) times daily.     naproxen sodium (ALEVE) 220 MG tablet Take 440 mg by mouth daily as needed (pain).     pantoprazole (PROTONIX) 40 MG  tablet TAKE ONE TABLET BY MOUTH TWICE DAILY (Patient taking differently: 40 mg 2 (two) times daily.) 180 tablet 1   tamsulosin (FLOMAX) 0.4 MG CAPS capsule Take 1 capsule (0.4 mg total) by mouth daily. 90 capsule 1   temazepam (RESTORIL) 30 MG capsule Take 1 capsule (30 mg total) by mouth at bedtime. 90 capsule 0   Vitamin D, Cholecalciferol, 1000 UNITS TABS Take 1,000 Units by mouth daily.      Current Facility-Administered Medications  Medication Dose Route Frequency Provider Last Rate Last Admin   ARIPiprazole ER (ABILIFY MAINTENA) 400 MG prefilled syringe 400 mg  400 mg Intramuscular Q28 days Arfeen, Arlyce Harman, MD  400 mg at 03/17/22 1010    Physical Exam:     BP 138/78   Pulse 74   Ht 5' 9.5" (1.765 m)   Wt 267 lb (121.1 kg)   BMI 38.86 kg/m   GENERAL:  Pleasant male in NAD PSYCH: : Cooperative, normal affect CARDIAC:  RRR, no murmur heard, no peripheral edema PULM: Normal respiratory effort, lungs CTA bilaterally, no wheezing ABDOMEN:  Nondistended, soft, nontender. No obvious masses, no hepatomegaly,  normal bowel sounds SKIN:  turgor, no lesions seen Musculoskeletal:  Normal muscle tone, normal strength NEURO: Alert and oriented x 3, no focal neurologic deficits   IMPRESSION and PLAN:    1) Dysphagia - EGD at [facility] with esophageal dilation - EGD unrevealing and no improvement with dilation, plan for modified barium swallow and Esophageal Manometry - In the interim, continue cutting food in small pieces, chewing thoroughly, and drinking plenty of fluids with meals  2) GERD - Well-controlled on current therapy - Evaluate for erosive esophagitis at time of upper endoscopy   3) History of difficult intubation 4) Mallampati 4 - Procedures to be scheduled at [facility] due to elevated periprocedural airway risks  5) History of colon polyps - Repeat colonoscopy in 2026 for ongoing polyp surveillance  The indications, risks, and benefits of EGD were explained to  the patient in detail. Risks include but are not limited to bleeding, perforation, adverse reaction to medications, and cardiopulmonary compromise. Sequelae include but are not limited to the possibility of surgery, hospitalization, and mortality. The patient verbalized understanding and wished to proceed. All questions answered, referred to scheduler. Further recommendations pending results of the exam.        Lavena Bullion ,DO, FACG 04/18/2022, 9:27 AM

## 2022-04-21 ENCOUNTER — Ambulatory Visit (HOSPITAL_BASED_OUTPATIENT_CLINIC_OR_DEPARTMENT_OTHER): Payer: PPO | Admitting: *Deleted

## 2022-04-21 VITALS — BP 145/83 | HR 70 | Resp 20 | Ht 68.0 in | Wt 264.8 lb

## 2022-04-21 DIAGNOSIS — F319 Bipolar disorder, unspecified: Secondary | ICD-10-CM | POA: Diagnosis not present

## 2022-04-21 NOTE — Patient Instructions (Signed)
Pt in office today for due Abilify 400 mg injection. Pt is appropriate, pleasant and cooperative on approach. Pt states he has some medical procedures upcoming. Pt has multiple co-morbidities but says that the Abilify Jodi Geralds is still working well at controlling Bi-Polar s/s. Denies any suicidal ideation or AVH. Injection prepared as ordered and given in [facility] without complaint. Pt to return in approximately 28 days for next due injection.

## 2022-04-28 ENCOUNTER — Telehealth (HOSPITAL_BASED_OUTPATIENT_CLINIC_OR_DEPARTMENT_OTHER): Payer: PPO | Admitting: Psychiatry

## 2022-04-28 ENCOUNTER — Encounter (HOSPITAL_COMMUNITY): Payer: Self-pay | Admitting: Psychiatry

## 2022-04-28 ENCOUNTER — Other Ambulatory Visit (HOSPITAL_COMMUNITY): Payer: Self-pay | Admitting: Psychiatry

## 2022-04-28 ENCOUNTER — Other Ambulatory Visit: Payer: Self-pay | Admitting: Gastroenterology

## 2022-04-28 ENCOUNTER — Telehealth: Payer: Self-pay | Admitting: Pharmacist

## 2022-04-28 ENCOUNTER — Other Ambulatory Visit: Payer: Self-pay | Admitting: Adult Health

## 2022-04-28 DIAGNOSIS — I10 Essential (primary) hypertension: Secondary | ICD-10-CM

## 2022-04-28 DIAGNOSIS — F319 Bipolar disorder, unspecified: Secondary | ICD-10-CM | POA: Diagnosis not present

## 2022-04-28 DIAGNOSIS — F411 Generalized anxiety disorder: Secondary | ICD-10-CM

## 2022-04-28 MED ORDER — CITALOPRAM HYDROBROMIDE 20 MG PO TABS: 20.0000 mg | ORAL_TABLET | Freq: Every day | ORAL | 0 refills | Status: DC

## 2022-04-28 MED ORDER — ARIPIPRAZOLE ER 400 MG IM PRSY: 400.0000 mg | PREFILLED_SYRINGE | INTRAMUSCULAR | 2 refills | Status: DC

## 2022-04-28 MED ORDER — TEMAZEPAM 15 MG PO CAPS: 15.0000 mg | ORAL_CAPSULE | Freq: Every day | ORAL | 0 refills | Status: DC

## 2022-04-28 MED ORDER — BENZTROPINE MESYLATE 0.5 MG PO TABS: 0.5000 mg | ORAL_TABLET | Freq: Every day | ORAL | 0 refills | Status: DC

## 2022-04-28 NOTE — Chronic Care Management (AMB) (Signed)
Chronic Care Management Pharmacy Assistant   Name: Sean Hill  MRN: [id number] DOB: 09-Mar-1941  Reason for Encounter: Medication Review Medication Coordination   Recent office visits:  None  Recent consult visits:  04/21/22 Alison Murray LPN [facility]) - Patient presented for Bipolar disorder and other concerns. No other visit details available.  04/18/22 Cirigliano, Dominic Pea, DO [facility]) - Patient presented for Esophageal dysphagia and other concerns. No medication changes.  04/13/22 Dixon, Marcello Moores DIXON  PA-C ( [facility]) - Patient presented for Post right knee replacement follow up. No medication changes  Hospital visits:  Patient presented to [facility] on 11/11/21 for Colonoscopy, was present for 3 hours.  Medications: Outpatient Encounter Medications as of 04/28/2022  Medication Sig   albuterol (VENTOLIN HFA) 108 (90 Base) MCG/ACT inhaler Inhale 2 puffs into the lungs every 6 (six) hours as needed for wheezing or shortness of breath.   ARIPiprazole ER (ABILIFY MAINTENA) 400 MG PRSY prefilled syringe Inject 400 mg into the muscle every 28 (twenty-eight) days.   azelastine (ASTELIN) 0.1 % nasal spray Place 2 sprays into both nostrils 2 (two) times daily. Use in each nostril as directed   benztropine (COGENTIN) 0.5 MG tablet Take 1 tablet (0.5 mg total) by mouth daily.   citalopram (CELEXA) 20 MG tablet Take 1 tablet (20 mg total) by mouth daily.   ezetimibe (ZETIA) 10 MG tablet TAKE ONE TABLET BY MOUTH AT NOON (Patient taking differently: Take 10 mg by mouth daily.)   fenofibrate (TRICOR) 145 MG tablet Take 1 tablet (145 mg total) by mouth daily.   icosapent Ethyl (VASCEPA) 1 g capsule Take 2 g by mouth 2 (two) times daily.   ipratropium-albuterol (DUONEB) 0.5-2.5 (3) MG/3ML SOLN Take 3 mLs by nebulization every 6 (six) hours as needed. (Patient taking differently: Take 3 mLs by nebulization every 6 (six) hours as needed (shortness of breath).)   losartan  (COZAAR) 50 MG tablet TAKE ONE TABLET BY MOUTH EVERY MORNING (Patient taking differently: Take 50 mg by mouth daily.)   Multiple Vitamin (MULTIVITAMIN WITH MINERALS) TABS tablet Take 1 tablet by mouth daily.   Multiple Vitamins-Minerals (PRESERVISION AREDS 2 PO) Take 1 capsule by mouth 2 (two) times daily.   naproxen sodium (ALEVE) 220 MG tablet Take 440 mg by mouth daily as needed (pain).   pantoprazole (PROTONIX) 40 MG tablet TAKE ONE TABLET BY MOUTH TWICE DAILY   tamsulosin (FLOMAX) 0.4 MG CAPS capsule Take 1 capsule (0.4 mg total) by mouth daily.   temazepam (RESTORIL) 30 MG capsule Take 1 capsule (30 mg total) by mouth at bedtime.   Vitamin D, Cholecalciferol, 1000 UNITS TABS Take 1,000 Units by mouth daily.    Facility-Administered Encounter Medications as of 04/28/2022  Medication   ARIPiprazole ER (ABILIFY MAINTENA) 400 MG prefilled syringe 400 mg  Reviewed chart for medication changes ahead of medication coordination call.  BP Readings from Last 3 Encounters:  04/18/22 138/78  03/10/22 130/60  02/17/22 120/60    Lab Results  Component Value Date   HGBA1C 6.0 (H) 08/16/2021     Patient obtains medications through Adherence Packaging  90 Days   Last adherence delivery included:  Cetirizine 10 mg: one tablet at breakfast Mens Multivitamin : Take one at breakfast Preservision Areds-2gt: one at breakfast and one at bedtime   Vitamin D3 1000 u: one tablet at breakfast Fenofibrate 145 mg: one tablet at breakfast Ezetimibe (ZETIA) 10 mg: one tablet at lunch Losartan (COZAAR) 50 MG tablet:  one tablet at breakfast Pantoprazole (Protonix) 40 mg : one tab at breakfast and one at dinner Benztropine (COGENTIN) 0.5 mg: one tablet at lunch Citalopram (CELEXA) 20 mg: one tablet at breakfast Vascepa 1 gm: Take 2g at breakfast and 2g at Dinner Tamsulosin 0.4 mg: take one capsule at breakfast     Patient declined the following medications  Fish oil 1000 - take one capsule at breakfast  and one at evening meal  ( per wife pt using vascepa in place of this)   Confirmed delivery date of 02/09/22, advised patient that pharmacy will contact them the morning of delivery.    Patient is due for next adherence delivery on: 05/10/22. Called patient and reviewed medications and coordinated delivery. Packs 90 DS  This delivery to include: Cetirizine 10 mg: one tablet at breakfast Mens Multivitamin : Take one at breakfast Preservision Areds-2gt: one at breakfast and one at bedtime   Vitamin D3 1000 u: one tablet at breakfast Fenofibrate 145 mg: one tablet at breakfast Ezetimibe (ZETIA) 10 mg: one tablet at lunch Losartan (COZAAR) 50 MG tablet: one tablet at breakfast Pantoprazole (Protonix) 40 mg : one tab at breakfast and one at dinner Benztropine (COGENTIN) 0.5 mg: one tablet at lunch Citalopram (CELEXA) 20 mg: one tablet at breakfast Vascepa 1 gm: Take 2g at breakfast and 2g at Dinner Tamsulosin 0.4 mg: take one capsule at breakfast   Coordinated  fill to be also delivered Azelastine Spray  Confirmed delivery date of 05/10/22, advised patient that pharmacy will contact them the morning of delivery.   Care Gaps: Flu Vaccine - Overdue  BP- 138/78 04/18/22 AWV- 1/23 CCM- 11/23  Star Rating Drugs: Losartan (Cozaar) 50 mg - Last filled 02/03/22 90 DS at [facility] Pharmacist Assistant [phone]

## 2022-04-28 NOTE — Progress Notes (Signed)
Virtual Visit via Telephone Note  I connected with Sean Hill on 04/28/22 at  3:40 PM EDT by telephone and verified that I am speaking with the correct person using two identifiers.  Location: Patient: Home Provider: Office   I discussed the limitations, risks, security and privacy concerns of performing an evaluation and management service by telephone and the availability of in person appointments. I also discussed with the patient that there may be a patient responsible charge related to this service. The patient expressed understanding and agreed to proceed.   History of Present Illness: Patient is evaluated by phone session.  He is taking his medication as prescribed.  He noticed sometimes he is sleeping too much and feels groggy next day.  He like to cut down his temazepam.  He feels his depression, anxiety, mania, impulsive behavior is much stable.  His tremors are manageable.  Denies any highs and lows.  He endorsed some time having shortness of breath and his primary care physician recommended to see a cardiologist and he had appointment coming up in a few weeks.  Denies any suicidal thoughts, hallucination or any paranoia.  He is taking Abilify, Celexa, Cogentin and temazepam.  Past Psychiatric History:  H/O multiple hospitalization.  Last inpatient in July 2014. H/O overdose on Latuda with alcohol. H/O cutting wrist.  Took Tegretol, Depakote, Ambien, Remeron, Vistaril, Geodon, Abilify, lithium, Provigil, Zoloft, Neurontin, Lamictal, Wellbutrin, Risperdal and Pristiq. H/O mania, aggression, getting speeding tickets, excessive buying and impulsive behavior.  Psychiatric Specialty Exam: Physical Exam  Review of Systems  Weight 264 lb (119.7 kg).There is no height or weight on file to calculate BMI.  General Appearance: NA  Eye Contact:  NA  Speech:  Slow  Volume:  Decreased  Mood:  Euthymic  Affect:  NA  Thought Process:  Goal Directed  Orientation:  Full (Time, Place, and  Person)  Thought Content:  WDL  Suicidal Thoughts:  No  Homicidal Thoughts:  No  Memory:  Immediate;   Fair Recent;   Fair Remote;   Fair  Judgement:  Intact  Insight:  Present  Psychomotor Activity:  NA  Concentration:  Concentration: Fair and Attention Span: Fair  Recall:  [employer] of Knowledge:  Fair  Language:  Good  Akathisia:  No  Handed:  Right  AIMS (if indicated):     Assets:  Communication Skills Desire for Improvement Housing Social Support  ADL's:  Intact  Cognition:  WNL  Sleep:   too much, sometimes drowsy      Assessment and Plan: Bipolar disorder type I.  Generalized anxiety disorder.  We have discussed in the past to cut down the medication but in the past he was reluctant but now willing to try a lower of the temazepam.  We will cut down his temazepam from '30mg'$  to 50 mg at bedtime.  Like his temazepam to [facility] and rest of the medication to abstain.  We will send Celexa 20 mg daily, Cogentin 0.5 mg [facility]  Continue Abilify injection 400 mg intramuscular every 28 days.  Recommended to call us back if is any question of any concern.  Follow-up in 3 months.  Follow Up Instructions:    I discussed the assessment and treatment plan with the patient. The patient was provided an opportunity to ask questions and all were answered. The patient agreed with the plan and demonstrated an understanding of the instructions.   The patient was advised to call  back or seek an in-person evaluation if the symptoms worsen or if the condition fails to improve as anticipated.  Collaboration of Care: Other provider involved in patient's care AEB notes are available in epic to review.  Patient/Guardian was advised Release of Information must be obtained prior to any record release in order to collaborate their care with an outside provider. Patient/Guardian was advised if they have not already done so to contact the registration department to sign  all necessary forms in order for [place] to release information regarding their care.   Consent: Patient/Guardian gives verbal consent for treatment and assignment of benefits for services provided during this visit. Patient/Guardian expressed understanding and agreed to proceed.    I provided 16 minutes of non-face-to-face time during this encounter.   [name], MD

## 2022-05-03 ENCOUNTER — Ambulatory Visit (HOSPITAL_COMMUNITY): Payer: PPO | Attending: Cardiovascular Disease

## 2022-05-03 DIAGNOSIS — E785 Hyperlipidemia, unspecified: Secondary | ICD-10-CM | POA: Diagnosis not present

## 2022-05-03 DIAGNOSIS — J45909 Unspecified asthma, uncomplicated: Secondary | ICD-10-CM | POA: Insufficient documentation

## 2022-05-03 DIAGNOSIS — I509 Heart failure, unspecified: Secondary | ICD-10-CM | POA: Insufficient documentation

## 2022-05-03 DIAGNOSIS — I110 Hypertensive heart disease with heart failure: Secondary | ICD-10-CM | POA: Insufficient documentation

## 2022-05-03 DIAGNOSIS — I447 Left bundle-branch block, unspecified: Secondary | ICD-10-CM | POA: Insufficient documentation

## 2022-05-03 DIAGNOSIS — R609 Edema, unspecified: Secondary | ICD-10-CM | POA: Insufficient documentation

## 2022-05-03 DIAGNOSIS — R079 Chest pain, unspecified: Secondary | ICD-10-CM

## 2022-05-03 DIAGNOSIS — R051 Acute cough: Secondary | ICD-10-CM | POA: Insufficient documentation

## 2022-05-03 DIAGNOSIS — R0602 Shortness of breath: Secondary | ICD-10-CM | POA: Diagnosis not present

## 2022-05-03 LAB — ECHOCARDIOGRAM COMPLETE
AR max vel: 4.39 cm2
AV Area VTI: 4.16 cm2
AV Area mean vel: 4.13 cm2
AV Mean grad: 7 mmHg
AV Peak grad: 12.5 mmHg
Ao pk vel: 1.77 m/s
Area-P 1/2: 2.81 cm2
S' Lateral: 1.9 cm

## 2022-05-05 ENCOUNTER — Encounter: Payer: Self-pay | Admitting: Adult Health

## 2022-05-05 ENCOUNTER — Ambulatory Visit (INDEPENDENT_AMBULATORY_CARE_PROVIDER_SITE_OTHER): Payer: PPO | Admitting: Adult Health

## 2022-05-05 VITALS — BP 120/60 | HR 68 | Temp 97.9°F | Ht 68.0 in | Wt 264.0 lb

## 2022-05-05 DIAGNOSIS — Z0000 Encounter for general adult medical examination without abnormal findings: Secondary | ICD-10-CM

## 2022-05-05 DIAGNOSIS — R351 Nocturia: Secondary | ICD-10-CM | POA: Diagnosis not present

## 2022-05-05 DIAGNOSIS — G4709 Other insomnia: Secondary | ICD-10-CM | POA: Diagnosis not present

## 2022-05-05 DIAGNOSIS — I10 Essential (primary) hypertension: Secondary | ICD-10-CM

## 2022-05-05 DIAGNOSIS — R7303 Prediabetes: Secondary | ICD-10-CM | POA: Diagnosis not present

## 2022-05-05 DIAGNOSIS — F3131 Bipolar disorder, current episode depressed, mild: Secondary | ICD-10-CM | POA: Diagnosis not present

## 2022-05-05 DIAGNOSIS — K2100 Gastro-esophageal reflux disease with esophagitis, without bleeding: Secondary | ICD-10-CM | POA: Diagnosis not present

## 2022-05-05 DIAGNOSIS — N401 Enlarged prostate with lower urinary tract symptoms: Secondary | ICD-10-CM | POA: Diagnosis not present

## 2022-05-05 DIAGNOSIS — R079 Chest pain, unspecified: Secondary | ICD-10-CM

## 2022-05-05 LAB — COMPREHENSIVE METABOLIC PANEL
ALT: 20 U/L (ref 0–53)
AST: 25 U/L (ref 0–37)
Albumin: 4.5 g/dL (ref 3.5–5.2)
Alkaline Phosphatase: 64 U/L (ref 39–117)
BUN: 18 mg/dL (ref 6–23)
CO2: 28 mEq/L (ref 19–32)
Calcium: 9.6 mg/dL (ref 8.4–10.5)
Chloride: 101 mEq/L (ref 96–112)
Creatinine, Ser: 1.2 mg/dL (ref 0.40–1.50)
GFR: 57.56 mL/min — ABNORMAL LOW (ref >60.00)
Glucose, Bld: 87 mg/dL (ref 70–99)
Potassium: 4.6 mEq/L (ref 3.5–5.1)
Sodium: 142 mEq/L (ref 135–145)
Total Bilirubin: 0.8 mg/dL (ref 0.2–1.2)
Total Protein: 6.7 g/dL (ref 6.0–8.3)

## 2022-05-05 LAB — PSA: PSA: 0.23 ng/mL (ref 0.10–4.00)

## 2022-05-05 LAB — CBC WITH DIFFERENTIAL/PLATELET
Basophils Absolute: 0.1 10*3/uL (ref 0.0–0.1)
Basophils Relative: 1 % (ref 0.0–3.0)
Eosinophils Absolute: 0.1 10*3/uL (ref 0.0–0.7)
Eosinophils Relative: 1.7 % (ref 0.0–5.0)
HCT: 46.6 % (ref 39.0–52.0)
Hemoglobin: 15.5 g/dL (ref 13.0–17.0)
Lymphocytes Relative: 35.5 % (ref 12.0–46.0)
Lymphs Abs: 2.3 10*3/uL (ref 0.7–4.0)
MCHC: 33.1 g/dL (ref 30.0–36.0)
MCV: 88.8 fl (ref 78.0–100.0)
Monocytes Absolute: 0.5 10*3/uL (ref 0.1–1.0)
Monocytes Relative: 7.7 % (ref 3.0–12.0)
Neutro Abs: 3.5 10*3/uL (ref 1.4–7.7)
Neutrophils Relative %: 54.1 % (ref 43.0–77.0)
Platelets: 222 10*3/uL (ref 150.0–400.0)
RBC: 5.25 Mil/uL (ref 4.22–5.81)
RDW: 15 % (ref 11.5–15.5)
WBC: 6.5 10*3/uL (ref 4.0–10.5)

## 2022-05-05 LAB — LIPID PANEL
Cholesterol: 173 mg/dL (ref 0–200)
HDL: 39.2 mg/dL (ref >39.00)
NonHDL: 134.12
Total CHOL/HDL Ratio: 4
Triglycerides: 218 mg/dL — ABNORMAL HIGH (ref 0.0–149.0)
VLDL: 43.6 mg/dL — ABNORMAL HIGH (ref 0.0–40.0)

## 2022-05-05 LAB — LDL CHOLESTEROL, DIRECT: Direct LDL: 106 mg/dL

## 2022-05-05 LAB — HEMOGLOBIN A1C: Hgb A1c MFr Bld: 6.2 % (ref 4.6–6.5)

## 2022-05-05 NOTE — Progress Notes (Signed)
Subjective:    Patient ID: Sean Hill, male    DOB: [birth year], 81 y.o.   MRN: [id number]  HPI Patient presents for yearly preventative medicine examination. He is a pleasant 81 year old male who  has a past medical history of Anal fissure, Anxiety, Arthritis, Asthma, Bipolar disorder ([place]), Cataract, Depression, Difficult airway for intubation, GERD (gastroesophageal reflux disease), Hemorrhage of colon following colonoscopy (10/08/2019), History of kidney stones, History of MRSA infection, Hyperlipidemia, Hypertension, Impaired hearing, Left bundle branch block (LBBB) on electrocardiogram (10/29/2015), Macular degeneration (senile) of retina, Pre-diabetes, and Wears glasses.  Hypertension-managed with losartan 50 mg daily.  He denies dizziness, lightheadedness, chest pain, shortness of breath BP Readings from Last 3 Encounters:  05/05/22 120/60  04/18/22 138/78  03/10/22 130/60   Hyperlipidemia-statin intolerant.  Prescribed Zetia 10 mg daily Vascepa 2 g daily. and fenofibrate 145 mg daily Lab Results  Component Value Date   CHOL 156 12/21/2021   HDL 30.40 (L) 12/21/2021   LDLDIRECT 91.0 12/21/2021   TRIG 227.0 (H) 12/21/2021   CHOLHDL 5 12/21/2021   GERD-controlled with Protonix 40 mg daily  Bipolar disorder-managed by psychiatry.  Currently prescribed citalopram 20 mg daily, Cogentin 0.5 mg daily, and Aripiprazole ER  400 mg injected every 28 days. He feels well controlled.   Insomnia -takes restorel 30 mg nightly and trazodone 50 mg nightly. He feels as though he gets a good nights rest on this combination.   Acute Cough -has been an ongoing issue, possibly from GERD, asthma, dysphagia.  Most recently had an echocardiogram done which showed. He reports that his cough is much improved.  Abnormal septal motion from LBBB . Left ventricular ejection fraction, by estimation, is 50 to 55%. The left ventricle has low normal function. The left ventricle has no regional wall  motion abnormalities. There is mild left ventricular hypertrophy. Left ventricular diastolic parameters were normal. The average left ventricular global longitudinal strain is -27.0 %. The global longitudinal strain is normal. 1. Right ventricular systolic function is normal. The right ventricular size is normal. There is normal pulmonary artery systolic pressure. 2. 3. Left atrial size was moderately dilated. The mitral valve is abnormal. Trivial mitral valve regurgitation. No evidence of mitral stenosis. 4. 5. Tricuspid valve regurgitation is mild to moderate. The aortic valve is tricuspid. There is mild calcification of the aortic valve. There is mild thickening of the aortic valve. Aortic valve regurgitation is not visualized. Aortic valve sclerosis is present, with no evidence of aortic valve stenosis. 6. Aortic dilatation noted. There is moderate dilatation of the aortic root, measuring 41 mm. There is mild dilatation of the ascending aorta, measuring 38 mm. 7. The inferior vena cava is normal in size with greater than 50% respiratory variability, suggesting right atrial pressure of 3 mmHg.  He does have an upcoming esophageal dilation   BPH - takes flomax 0.4 mg daily - he reports that most nights he still gets up to urinate 3 times a night   Pre diabetes - not on medication. His diet is poor and he leads a sedentary life. His wife reports that he eats food all day and does not eat healthy.  Lab Results  Component Value Date   HGBA1C 6.0 (H) 08/16/2021    Acute Chest Pain -this is biggest concern today.  He reports that over the last 2 weeks he has been experiencing chronic left-sided chest pain and congestion.  He reports that the pain is not bad but constant and often  feels as though someone is sitting on his left chest.  He also endorses worsening shortness of breath, walking from the car to the house which is a short distance makes him very short of breath.  He does not  have any radiating pain up his left jaw or down his left arm.  He has never had this feeling of discomfort in his left chest before.  All immunizations and health maintenance protocols were reviewed with the patient and needed orders were placed.  Appropriate screening laboratory values were ordered for the patient including screening of hyperlipidemia, renal function and hepatic function. If indicated by BPH, a PSA was ordered.  Medication reconciliation,  past medical history, social history, problem list and allergies were reviewed in detail with the patient  Goals were established with regard to weight loss, exercise, and  diet in compliance with medications. Leads a sedentary life Wt Readings from Last 3 Encounters:  05/05/22 264 lb (119.7 kg)  04/18/22 267 lb (121.1 kg)  03/10/22 268 lb (121.6 kg)   Review of Systems  Constitutional:  Positive for fatigue.  HENT: Negative.    Eyes: Negative.   Respiratory:  Positive for shortness of breath.   Cardiovascular:  Positive for chest pain. Negative for palpitations and leg swelling.  Gastrointestinal: Negative.   Endocrine: Negative.   Genitourinary:  Positive for frequency.  Musculoskeletal:  Positive for arthralgias and joint swelling.  Allergic/Immunologic: Negative.   Neurological: Negative.   Psychiatric/Behavioral: Negative.     Past Medical History:  Diagnosis Date   Anal fissure    Anxiety    Arthritis    right wrist; pt. states "everywhere"   Asthma    Bipolar disorder ([place])    Cataract    Depression    Difficult airway for intubation    GERD (gastroesophageal reflux disease)    Hemorrhage of colon following colonoscopy 10/08/2019   History of kidney stones    History of MRSA infection    left knee after arthroplasty   Hyperlipidemia    Hypertension    states under control with med., has been on med. x 1 yr.   Impaired hearing    Left bundle branch block (LBBB) on electrocardiogram 10/29/2015   Macular  degeneration (senile) of retina    left   Pre-diabetes    Wears glasses     Social History   Socioeconomic History   Marital status: Married    Spouse name: Not on file   Number of children: Not on file   Years of education: Not on file   Highest education level: Not on file  Occupational History   Not on file  Tobacco Use   Smoking status: Never   Smokeless tobacco: Never  Vaping Use   Vaping Use: Never used  Substance and Sexual Activity   Alcohol use: Not Currently    Comment: occasional.   Drug use: No   Sexual activity: Not Currently    Birth control/protection: None  Other Topics Concern   Not on file  Social History Narrative   Married, lives in [place].  Retired Dance movement psychotherapist.   Two daughters, both married,3 grandchildren ( 38, 33, 1 year)    No regular exercise.  Never smoker.  No ETOH.  No drugs.   Social Determinants of Health   Financial Resource Strain: Low Risk  (09/23/2021)   Overall Financial Resource Strain (CARDIA)    Difficulty of Paying Living Expenses: Not hard at all  Food Insecurity: No Food Insecurity (  09/23/2021)   Hunger Vital Sign    Worried About Running Out of Food in the Last Year: Never true    [place] in the Last Year: Never true  Transportation Needs: No Transportation Needs (09/23/2021)   PRAPARE - Hydrologist (Medical): No    Lack of Transportation (Non-Medical): No  Physical Activity: Inactive (09/23/2021)   Exercise Vital Sign    Days of Exercise per Week: 0 days    Minutes of Exercise per Session: 0 min  Stress: No Stress Concern Present (09/23/2021)   [place]    Feeling of Stress : Not at all  Social Connections: Moderately Isolated (09/23/2021)   Social Connection and Isolation Panel [NHANES]    Frequency of Communication with Friends and Family: More than three times a week    Frequency of Social Gatherings with Friends and  Family: More than three times a week    Attends Religious Services: Never    Marine scientist or Organizations: No    Attends Archivist Meetings: Never    Marital Status: Married  Human resources officer Violence: Not At Risk (09/23/2021)   Humiliation, Afraid, Rape, and Kick questionnaire    Fear of Current or Ex-Partner: No    Emotionally Abused: No    Physically Abused: No    Sexually Abused: No    Past Surgical History:  Procedure Laterality Date   ANKLE FUSION Bilateral    ANKLE SURGERY Bilateral    ligament surgery   BIOPSY  04/29/2020   Procedure: BIOPSY;  Surgeon: Lavena Bullion, DO;  Location: [facility];  Service: Gastroenterology;;   CARDIAC CATHETERIZATION  x 2   1983; 11/14/2003   CARPOMETACARPEL SUSPENSION PLASTY Right 09/22/2016   Procedure: right thumb carpometacarpal  ARTHROPLASTY;  Surgeon: Leanora Cover, MD;  Location: [facility];  Service: Orthopedics;  Laterality: Right;  right thumb carpometacarpal  ARTHROPLASTY   CARPOMETACARPEL SUSPENSION PLASTY Right 11/02/2017   Procedure: RIGHT THUMB SUSPENSIONPLASTY WITH TIGHTROPE, DISTAL POLE SCAPHOID  EXCISION;  Surgeon: Leanora Cover, MD;  Location: [facility];  Service: Orthopedics;  Laterality: Right;   COLONOSCOPY WITH PROPOFOL N/A 10/02/2019   Procedure: COLONOSCOPY WITH PROPOFOL;  Surgeon: Lavena Bullion, DO;  [facility]: [facility];  Service: Gastroenterology;  Laterality: N/A;   COLONOSCOPY WITH PROPOFOL N/A 10/09/2019   Procedure: COLONOSCOPY WITH PROPOFOL;  Surgeon: Gatha Mayer, MD;  [facility]: [facility];  Service: Endoscopy;  Laterality: N/A;   COLONOSCOPY WITH PROPOFOL N/A 04/29/2020   Procedure: COLONOSCOPY WITH PROPOFOL;  Surgeon: Lavena Bullion, DO;  [facility]: [facility];  Service: Gastroenterology;  Laterality: N/A;   COLONOSCOPY WITH PROPOFOL N/A 11/11/2021   Procedure: COLONOSCOPY WITH PROPOFOL;  Surgeon: Lavena Bullion, DO;  [facility];  Service: Gastroenterology;  Laterality: N/A;   ELBOW SURGERY Left    ENDOSCOPIC MUCOSAL RESECTION N/A 10/02/2019   Procedure: ENDOSCOPIC MUCOSAL RESECTION;  Surgeon: Lavena Bullion, DO;  [facility];  Service: Gastroenterology;  Laterality: N/A;   HEMOSTASIS CLIP PLACEMENT  10/09/2019   Procedure: HEMOSTASIS CLIP PLACEMENT;  Surgeon: Gatha Mayer, MD;  [facility];  Service: Endoscopy;;   HEMOSTASIS CLIP PLACEMENT  11/11/2021   Procedure: HEMOSTASIS CLIP PLACEMENT;  Surgeon: Lavena Bullion, DO;  [facility];  Service: Gastroenterology;;   INGUINAL HERNIA REPAIR Right    KNEE SURGERY     OPEN REDUCTION INTERNAL FIXATION (  ORIF) DISTAL RADIAL FRACTURE Right 10/29/2015   Procedure: OPEN REDUCTION INTERNAL FIXATION (ORIF) DISTAL RADIAL FRACTURE;  Surgeon: Leanora Cover, MD;  Location: [place];  Service: Orthopedics;  Laterality: Right;   ORIF ELBOW FRACTURE Right    POLYPECTOMY  04/29/2020   Procedure: POLYPECTOMY;  Surgeon: Lavena Bullion, DO;  [facility];  Service: Gastroenterology;;   POLYPECTOMY  11/11/2021   Procedure: POLYPECTOMY;  Surgeon: Lavena Bullion, DO;  [facility];  Service: Gastroenterology;;   SUBMUCOSAL LIFTING INJECTION  10/02/2019   Procedure: SUBMUCOSAL LIFTING INJECTION;  Surgeon: Lavena Bullion, DO;  [facility];  Service: Gastroenterology;;   TONSILLECTOMY     TOTAL KNEE ARTHROPLASTY Bilateral    TOTAL KNEE REVISION Right 08/25/2021   Procedure: Right knee polyethylene vs total knee arthroplasty revision;  Surgeon: Gaynelle Arabian, MD;  Location: [facility];  Service: Orthopedics;  Laterality: Right;   ULNAR COLLATERAL LIGAMENT REPAIR Right 12/29/2015   Procedure: REPAIR  RIGHT LATERAL ULNAR COLLATERAL LIGAMENT TEAR  EXTENSOR ORIGIN ;  Surgeon: Leanora Cover, MD;  Location: [facility];  Service: Orthopedics;  Laterality: Right;   WRIST ARTHROSCOPY  WITH DEBRIDEMENT Right 07/14/2016   Procedure: RIGHT WRIST ARTHROSCOPY WITH DEBRIDEMENT  TRIANGULAR FIBROCARTILAGE COMPLEX;  Surgeon: Leanora Cover, MD;  Location: [facility];  Service: Orthopedics;  Laterality: Right;    Family History  Problem Relation Age of Onset   Alcohol abuse Mother    Ovarian cancer Mother    Alcohol abuse Father    Pancreatic cancer Father    Hyperlipidemia Brother    Barrett's esophagus Brother    Colon cancer Maternal Grandmother    Depression Daughter    Paranoid behavior Daughter    Esophageal cancer Neg Hx    Stomach cancer Neg Hx    Rectal cancer Neg Hx    Colon polyps Neg Hx     Allergies  Allergen Reactions   Penicillins Other (See Comments)    BLISTERS Did it involve swelling of the face/tongue/throat, SOB, or low BP? No Did it involve sudden or severe rash/hives, skin peeling, or any reaction on the inside of your mouth or nose? No Did you need to seek medical attention at a hospital or doctor's office? No When did it last happen? around 1980    If all above answers are "NO", may proceed with cephalosporin use. Tolerated Cephalosporin Date: 08/26/21.     Lisinopril Diarrhea and Cough    Dry cough, abdominal bloating and diarrhea   Codeine Rash    Current Outpatient Medications on File Prior to Visit  Medication Sig Dispense Refill   albuterol (VENTOLIN HFA) 108 (90 Base) MCG/ACT inhaler Inhale 2 puffs into the lungs every 6 (six) hours as needed for wheezing or shortness of breath. 6.7 g 3   ARIPiprazole ER (ABILIFY MAINTENA) 400 MG PRSY prefilled syringe Inject 400 mg into the muscle every 28 (twenty-eight) days. 1 each 2   azelastine (ASTELIN) 0.1 % nasal spray Place 2 sprays into both nostrils 2 (two) times daily. Use in each nostril as directed 30 mL 6   benztropine (COGENTIN) 0.5 MG tablet Take 1 tablet (0.5 mg total) by mouth daily. 90 tablet 0   citalopram (CELEXA) 20 MG tablet Take 1 tablet (20 mg total) by mouth  daily. 90 tablet 0   ezetimibe (ZETIA) 10 MG tablet TAKE ONE TABLET BY MOUTH AT NOON 90 tablet 3   fenofibrate (TRICOR) 145 MG tablet TAKE ONE TABLET  BY MOUTH ONCE DAILY 90 tablet 3   icosapent Ethyl (VASCEPA) 1 g capsule Take 2 g by mouth 2 (two) times daily.     ipratropium-albuterol (DUONEB) 0.5-2.5 (3) MG/3ML SOLN Take 3 mLs by nebulization every 6 (six) hours as needed. (Patient taking differently: Take 3 mLs by nebulization every 6 (six) hours as needed (shortness of breath).) 360 mL 0   losartan (COZAAR) 50 MG tablet TAKE ONE TABLET BY MOUTH EVERY MORNING 90 tablet 3   Multiple Vitamin (MULTIVITAMIN WITH MINERALS) TABS tablet Take 1 tablet by mouth daily.     Multiple Vitamins-Minerals (PRESERVISION AREDS 2 PO) Take 1 capsule by mouth 2 (two) times daily.     naproxen sodium (ALEVE) 220 MG tablet Take 440 mg by mouth daily as needed (pain).     pantoprazole (PROTONIX) 40 MG tablet TAKE ONE TABLET BY MOUTH TWICE DAILY 180 tablet 1   tamsulosin (FLOMAX) 0.4 MG CAPS capsule Take 1 capsule (0.4 mg total) by mouth daily. 90 capsule 1   temazepam (RESTORIL) 15 MG capsule Take 1 capsule (15 mg total) by mouth at bedtime. 90 capsule 0   Vitamin D, Cholecalciferol, 1000 UNITS TABS Take 1,000 Units by mouth daily.      Current Facility-Administered Medications on File Prior to Visit  Medication Dose Route Frequency Provider Last Rate Last Admin   ARIPiprazole ER (ABILIFY MAINTENA) 400 MG prefilled syringe 400 mg  400 mg Intramuscular Q28 days Arfeen, Arlyce Harman, MD   400 mg at 04/21/22 1114    BP 120/60   Pulse 68   Temp 97.9 F (36.6 C) (Oral)   Ht '5\' 8"'$  (1.727 m)   Wt 264 lb (119.7 kg)   SpO2 94%   BMI 40.14 kg/m       Objective:   Physical Exam Vitals and nursing note reviewed.  Constitutional:      General: He is not in acute distress.    Appearance: Normal appearance. He is well-developed. He is obese.  HENT:     Head: Normocephalic and atraumatic.     Right Ear: Tympanic  membrane, ear canal and external ear normal. There is no impacted cerumen.     Left Ear: Tympanic membrane, ear canal and external ear normal. There is no impacted cerumen.     Nose: Nose normal. No congestion or rhinorrhea.     Mouth/Throat:     Mouth: Mucous membranes are moist.     Pharynx: Oropharynx is clear. No oropharyngeal exudate or posterior oropharyngeal erythema.  Eyes:     General:        Right eye: No discharge.        Left eye: No discharge.     Extraocular Movements: Extraocular movements intact.     Conjunctiva/sclera: Conjunctivae normal.     Pupils: Pupils are equal, round, and reactive to light.  Neck:     Vascular: No carotid bruit.     Trachea: No tracheal deviation.  Cardiovascular:     Rate and Rhythm: Normal rate and regular rhythm.     Pulses: Normal pulses.     Heart sounds: Normal heart sounds. No murmur heard.    No friction rub. No gallop.  Pulmonary:     Effort: Pulmonary effort is normal. No respiratory distress.     Breath sounds: Normal breath sounds. No stridor. No wheezing, rhonchi or rales.  Chest:     Chest wall: No tenderness.  Abdominal:     General: Bowel sounds are normal. There  is no distension.     Palpations: Abdomen is soft. There is no mass.     Tenderness: There is no abdominal tenderness. There is no right CVA tenderness, left CVA tenderness, guarding or rebound.     Hernia: No hernia is present.  Musculoskeletal:        General: No swelling, tenderness, deformity or signs of injury. Normal range of motion.     Right lower leg: No edema.     Left lower leg: No edema.  Lymphadenopathy:     Cervical: No cervical adenopathy.  Skin:    General: Skin is warm and dry.     Capillary Refill: Capillary refill takes less than 2 seconds.     Coloration: Skin is not jaundiced or pale.     Findings: No bruising, erythema, lesion or rash.  Neurological:     General: No focal deficit present.     Mental Status: He is alert and oriented to  person, place, and time.     Cranial Nerves: No cranial nerve deficit.     Sensory: No sensory deficit.     Motor: No weakness.     Coordination: Coordination normal.     Gait: Gait normal.     Deep Tendon Reflexes: Reflexes normal.  Psychiatric:        Mood and Affect: Mood normal.        Behavior: Behavior normal.        Thought Content: Thought content normal.        Judgment: Judgment normal.       Assessment & Plan:  1. Routine general medical examination at a health care facility - Stressed the importance of heart healthy diet and exercise  - Follow up in one year or sooner if needed - CBC with Differential/Platelet; Future - Comprehensive metabolic panel; Future - Lipid panel; Future - Hemoglobin A1c; Future - Hemoglobin A1c - Lipid panel - Comprehensive metabolic panel - CBC with Differential/Platelet  2. Essential hypertension - Controlled. No change in medications  - CBC with Differential/Platelet; Future - Comprehensive metabolic panel; Future - Lipid panel; Future - Hemoglobin A1c; Future - Hemoglobin A1c - Lipid panel - Comprehensive metabolic panel - CBC with Differential/Platelet  3. Gastroesophageal reflux disease with esophagitis without hemorrhage - Continue PPI - CBC with Differential/Platelet; Future - Comprehensive metabolic panel; Future - Lipid panel; Future - Hemoglobin A1c; Future - Hemoglobin A1c - Lipid panel - Comprehensive metabolic panel - CBC with Differential/Platelet  4. Bipolar affective disorder, currently depressed, mild ([place]) - Per psychiatry  - CBC with Differential/Platelet; Future - Comprehensive metabolic panel; Future - Lipid panel; Future - Hemoglobin A1c; Future - Hemoglobin A1c - Lipid panel - Comprehensive metabolic panel - CBC with Differential/Platelet  5. Other insomnia  - CBC with Differential/Platelet; Future - Comprehensive metabolic panel; Future - Lipid panel; Future - Hemoglobin A1c; Future -  Hemoglobin A1c - Lipid panel - Comprehensive metabolic panel - CBC with Differential/Platelet  6. BPH with nocturia - Consider increase Flomax to 0.8 mg.  - PSA; Future - PSA  7. Chest pain, unspecified type  - EKG 12-Lead - poor tracing but showed sinus bradycardia ( rate 58) with occasional PVC and nonspecific intraventrucular block.  - Due to age, health history and new symptoms of chest pain and worsening shortness of breath will refer to cardiology for further assessment  - Ambulatory referral to Cardiology  8. Prediabetes  - CBC with Differential/Platelet; Future - Comprehensive metabolic panel; Future - Lipid panel;  Future - Hemoglobin A1c; Future - Hemoglobin A1c - Lipid panel - Comprehensive metabolic panel - CBC with Differential/Platelet  [name], NP

## 2022-05-06 ENCOUNTER — Other Ambulatory Visit: Payer: Self-pay | Admitting: Adult Health

## 2022-05-06 ENCOUNTER — Other Ambulatory Visit: Payer: Self-pay

## 2022-05-06 DIAGNOSIS — N401 Enlarged prostate with lower urinary tract symptoms: Secondary | ICD-10-CM

## 2022-05-06 MED ORDER — METFORMIN HCL 500 MG PO TABS: 1000.0000 mg | ORAL_TABLET | Freq: Two times a day (BID) | ORAL | 0 refills | Status: DC

## 2022-05-06 MED ORDER — METFORMIN HCL 500 MG PO TABS: 500.0000 mg | ORAL_TABLET | Freq: Every day | ORAL | 0 refills | Status: DC

## 2022-05-06 MED ORDER — TAMSULOSIN HCL 0.4 MG PO CAPS: 0.8000 mg | ORAL_CAPSULE | Freq: Every day | ORAL | 0 refills | Status: DC

## 2022-05-09 ENCOUNTER — Telehealth: Payer: Self-pay

## 2022-05-09 DIAGNOSIS — N401 Enlarged prostate with lower urinary tract symptoms: Secondary | ICD-10-CM

## 2022-05-09 MED ORDER — TAMSULOSIN HCL 0.4 MG PO CAPS: 0.8000 mg | ORAL_CAPSULE | Freq: Every day | ORAL | 0 refills | Status: DC

## 2022-05-09 NOTE — Telephone Encounter (Signed)
-----   Message from Viona Gilmore, [facility] sent at 05/09/2022 11:11 AM EDT ----- Regarding: Tamsulosin rx Hi,  It looks like tamsulosin was increased to two capsules per day but the quantity sent in was only 90 which would last 45 days. Can this be resent to [facility] for 180 capsules for a 90 ds?  Thanks! Maddie

## 2022-05-17 ENCOUNTER — Encounter: Payer: Self-pay | Admitting: Adult Health

## 2022-05-17 NOTE — Telephone Encounter (Signed)
Please advise 

## 2022-05-19 ENCOUNTER — Ambulatory Visit (HOSPITAL_BASED_OUTPATIENT_CLINIC_OR_DEPARTMENT_OTHER): Payer: PPO | Admitting: *Deleted

## 2022-05-19 ENCOUNTER — Encounter (HOSPITAL_COMMUNITY): Payer: Self-pay | Admitting: *Deleted

## 2022-05-19 ENCOUNTER — Other Ambulatory Visit: Payer: Self-pay | Admitting: Adult Health

## 2022-05-19 VITALS — BP 121/71 | HR 73 | Resp 20 | Ht 68.0 in | Wt 288.8 lb

## 2022-05-19 DIAGNOSIS — F319 Bipolar disorder, unspecified: Secondary | ICD-10-CM | POA: Diagnosis not present

## 2022-05-19 MED ORDER — CEPHALEXIN 500 MG PO CAPS
2000.0000 mg | ORAL_CAPSULE | Freq: Once | ORAL | 0 refills | Status: AC
Start: 2022-05-19 — End: 2022-05-19

## 2022-05-19 NOTE — Patient Instructions (Addendum)
Pt presents today for due Abilify Maintena 400 mg injection. Pt is cooperative, pleasant, and approbate on approach. Affect and mood baseline anxious. Pt does describes pain in his shoulder, L, has been his main issue altough he did mention that he has been having some chest "pressure" but this has concerned him r/t family history of CAD. Pt recently had an ECHO, awaiting results. Pt also stated that  he is going to talk to Dr. Adele Schilder on next appointment about coming off a couple of meds, Restoril and Cogentin, as his memory he says is getting progressively worse. No c/o mood swings or AVH. No si/hi. Injection prepared as ordered and given in RUOQ without c/o. Pt to return in approximately 28 days for next due injection. Pt agrees to call with any questions or concerns prior to next appointment.

## 2022-05-24 ENCOUNTER — Encounter: Payer: Self-pay | Admitting: Cardiology

## 2022-05-24 NOTE — Progress Notes (Signed)
Cardiology Office Note   Date:  05/26/2022   ID:  Sean Hill, DOB 07/12/1941, MRN [id number]  PCP:  [name], NP  Cardiologist:   None Referring:  [name], NP  No chief complaint on file.     History of Present Illness: Sean Hill is a 81 y.o. male who was referred for evaluation of chest pain.  He was referred by Dr. Carlis Stable.  He has been having increasing shortness of breath.  He says this has been going on for about 5 months.  It started out with mild to moderate activity but now he walks a few feet on level ground and he is dyspneic.  Walking around the office today he was dyspneic but his oxygen saturations stayed in the mid 90s.  He said he is chronically slept on 2 pillows and this has not changed.  He has not had any increased lower extremity swelling.  I see that his weight is unchanged.  He does say he has difficulty swallowing.  He has reflux.  He said he has been treated twice this year for pneumonia with prednisone.  Chest x-ray his primary care office was unremarkable.  He is not describing substernal chest pressure but he does get a sense of feeling a little heaviness when he is active.  He is not having any arm or neck discomfort.  He does not feel palpitations, presyncope or syncope.  He has not had any cough fevers or chills.  Echo in August 2023 demonstrated a low normal EF.  There was mild to moderate TR.  The aorta was estimated to be 41 mm.  Thre was very mild AS.  He was seen in 2017 by Dr. Ellyn Hack for LBBB.  He had normal coronaries on cath in 2017.       Past Medical History:  Diagnosis Date   Anal fissure    Anxiety    Arthritis    right wrist; pt. states "everywhere"   Asthma    Bipolar disorder ([place])    Cataract    Depression    Difficult airway for intubation    GERD (gastroesophageal reflux disease)    Hemorrhage of colon following colonoscopy 10/08/2019   History of kidney stones    History of MRSA infection    left  knee after arthroplasty   Hyperlipidemia    Hypertension    states under control with med., has been on med. x 1 yr.   Impaired hearing    Left bundle branch block (LBBB) on electrocardiogram 10/29/2015   Macular degeneration (senile) of retina    left   Pre-diabetes     Past Surgical History:  Procedure Laterality Date   ANKLE FUSION Bilateral    ANKLE SURGERY Bilateral    ligament surgery   BIOPSY  04/29/2020   Procedure: BIOPSY;  Surgeon: Lavena Bullion, DO;  [facility]: [facility];  Service: Gastroenterology;;   CARDIAC CATHETERIZATION  x 2   1983; 11/14/2003   CARPOMETACARPEL SUSPENSION PLASTY Right 09/22/2016   Procedure: right thumb carpometacarpal  ARTHROPLASTY;  Surgeon: Leanora Cover, MD;  Location: [place];  Service: Orthopedics;  Laterality: Right;  right thumb carpometacarpal  ARTHROPLASTY   CARPOMETACARPEL SUSPENSION PLASTY Right 11/02/2017   Procedure: RIGHT THUMB SUSPENSIONPLASTY WITH TIGHTROPE, DISTAL POLE SCAPHOID  EXCISION;  Surgeon: Leanora Cover, MD;  Location: [place];  Service: Orthopedics;  Laterality: Right;   COLONOSCOPY WITH PROPOFOL N/A 10/02/2019   Procedure: COLONOSCOPY WITH PROPOFOL;  Surgeon: Lavena Bullion, DO;  [facility];  Service: Gastroenterology;  Laterality: N/A;   COLONOSCOPY WITH PROPOFOL N/A 10/09/2019   Procedure: COLONOSCOPY WITH PROPOFOL;  Surgeon: Gatha Mayer, MD;  [facility];  Service: Endoscopy;  Laterality: N/A;   COLONOSCOPY WITH PROPOFOL N/A 04/29/2020   Procedure: COLONOSCOPY WITH PROPOFOL;  Surgeon: Lavena Bullion, DO;  [facility];  Service: Gastroenterology;  Laterality: N/A;   COLONOSCOPY WITH PROPOFOL N/A 11/11/2021   Procedure: COLONOSCOPY WITH PROPOFOL;  Surgeon: Lavena Bullion, DO;  [facility];  Service: Gastroenterology;  Laterality: N/A;   ELBOW SURGERY Left    ENDOSCOPIC MUCOSAL RESECTION N/A 10/02/2019   Procedure:  ENDOSCOPIC MUCOSAL RESECTION;  Surgeon: Lavena Bullion, DO;  [facility];  Service: Gastroenterology;  Laterality: N/A;   HEMOSTASIS CLIP PLACEMENT  10/09/2019   Procedure: HEMOSTASIS CLIP PLACEMENT;  Surgeon: Gatha Mayer, MD;  [facility];  Service: Endoscopy;;   HEMOSTASIS CLIP PLACEMENT  11/11/2021   Procedure: HEMOSTASIS CLIP PLACEMENT;  Surgeon: Lavena Bullion, DO;  [facility];  Service: Gastroenterology;;   INGUINAL HERNIA REPAIR Right    KNEE SURGERY     OPEN REDUCTION INTERNAL FIXATION (ORIF) DISTAL RADIAL FRACTURE Right 10/29/2015   Procedure: OPEN REDUCTION INTERNAL FIXATION (ORIF) DISTAL RADIAL FRACTURE;  Surgeon: Leanora Cover, MD;  Location: [place];  Service: Orthopedics;  Laterality: Right;   ORIF ELBOW FRACTURE Right    POLYPECTOMY  04/29/2020   Procedure: POLYPECTOMY;  Surgeon: Lavena Bullion, DO;  [facility];  Service: Gastroenterology;;   POLYPECTOMY  11/11/2021   Procedure: POLYPECTOMY;  Surgeon: Lavena Bullion, DO;  [facility];  Service: Gastroenterology;;   SUBMUCOSAL LIFTING INJECTION  10/02/2019   Procedure: SUBMUCOSAL LIFTING INJECTION;  Surgeon: Lavena Bullion, DO;  [facility];  Service: Gastroenterology;;   TONSILLECTOMY     TOTAL KNEE ARTHROPLASTY Bilateral    TOTAL KNEE REVISION Right 08/25/2021   Procedure: Right knee polyethylene vs total knee arthroplasty revision;  Surgeon: Gaynelle Arabian, MD;  [facility];  Service: Orthopedics;  Laterality: Right;   ULNAR COLLATERAL LIGAMENT REPAIR Right 12/29/2015   Procedure: REPAIR  RIGHT LATERAL ULNAR COLLATERAL LIGAMENT TEAR  EXTENSOR ORIGIN ;  Surgeon: Leanora Cover, MD;  Location: [place];  Service: Orthopedics;  Laterality: Right;   WRIST ARTHROSCOPY WITH DEBRIDEMENT Right 07/14/2016   Procedure: RIGHT WRIST ARTHROSCOPY WITH DEBRIDEMENT  TRIANGULAR FIBROCARTILAGE COMPLEX;  Surgeon: Leanora Cover, MD;  Location: [place];  Service: Orthopedics;  Laterality: Right;     Current Outpatient Medications  Medication Sig Dispense Refill   albuterol (VENTOLIN HFA) 108 (90 Base) MCG/ACT inhaler Inhale 2 puffs into the lungs every 6 (six) hours as needed for wheezing or shortness of breath. 6.7 g 3   ARIPiprazole ER (ABILIFY MAINTENA) 400 MG PRSY prefilled syringe Inject 400 mg into the muscle every 28 (twenty-eight) days. 1 each 2   azelastine (ASTELIN) 0.1 % nasal spray Place 2 sprays into both nostrils 2 (two) times daily. Use in each nostril as directed 30 mL 6   benztropine (COGENTIN) 0.5 MG tablet Take 1 tablet (0.5 mg total) by mouth daily. 90 tablet 0   citalopram (CELEXA) 20 MG tablet Take 1 tablet (20 mg total) by mouth daily. 90 tablet 0   ezetimibe (ZETIA) 10 MG tablet TAKE ONE TABLET BY MOUTH AT NOON 90 tablet 3   fenofibrate (TRICOR) 145 MG tablet TAKE  ONE TABLET BY MOUTH ONCE DAILY 90 tablet 3   icosapent Ethyl (VASCEPA) 1 g capsule Take 2 g by mouth 2 (two) times daily.     ipratropium-albuterol (DUONEB) 0.5-2.5 (3) MG/3ML SOLN Take 3 mLs by nebulization every 6 (six) hours as needed. (Patient taking differently: Take 3 mLs by nebulization every 6 (six) hours as needed (shortness of breath).) 360 mL 0   losartan (COZAAR) 50 MG tablet TAKE ONE TABLET BY MOUTH EVERY MORNING 90 tablet 3   metFORMIN (GLUCOPHAGE) 500 MG tablet Take 1 tablet (500 mg total) by mouth daily. 90 tablet 0   metoprolol tartrate (LOPRESSOR) 100 MG tablet Take 1 tablet by mouth once for procedure. 1 tablet 0   Multiple Vitamin (MULTIVITAMIN WITH MINERALS) TABS tablet Take 1 tablet by mouth daily.     Multiple Vitamins-Minerals (PRESERVISION AREDS 2 PO) Take 1 capsule by mouth 2 (two) times daily.     naproxen sodium (ALEVE) 220 MG tablet Take 440 mg by mouth daily as needed (pain).     pantoprazole (PROTONIX) 40 MG tablet TAKE ONE TABLET BY MOUTH TWICE DAILY 180 tablet 1   tamsulosin  (FLOMAX) 0.4 MG CAPS capsule Take 2 capsules (0.8 mg total) by mouth daily. 180 capsule 0   temazepam (RESTORIL) 15 MG capsule Take 1 capsule (15 mg total) by mouth at bedtime. 90 capsule 0   Vitamin D, Cholecalciferol, 1000 UNITS TABS Take 1,000 Units by mouth daily.      Current Facility-Administered Medications  Medication Dose Route Frequency Provider Last Rate Last Admin   ARIPiprazole ER (ABILIFY MAINTENA) 400 MG prefilled syringe 400 mg  400 mg Intramuscular Q28 days Arfeen, Arlyce Harman, MD   400 mg at 05/19/22 1219    Allergies:   Penicillins, Lisinopril, and Codeine    Social History:  The patient  reports that he has never smoked. He has never used smokeless tobacco. He reports that he does not currently use alcohol. He reports that he does not use drugs.   Family History:  The patient's family history includes Alcohol abuse in his father and mother; Barrett's esophagus in his brother; Colon cancer in his maternal grandmother; Depression in his daughter; Hyperlipidemia in his brother; Ovarian cancer in his mother; Pancreatic cancer in his father; Paranoid behavior in his daughter.    ROS:  Please see the history of present illness.   Otherwise, review of systems are positive for none.   All other systems are reviewed and negative.    PHYSICAL EXAM: VS:  BP 125/78   Pulse 85   Ht '5\' 9"'$  (1.753 m)   Wt 269 lb 9.6 oz (122.3 kg)   SpO2 93%   BMI 39.81 kg/m  , BMI Body mass index is 39.81 kg/m. GENERAL:  Well appearing HEENT:  Pupils equal round and reactive, fundi not visualized, oral mucosa unremarkable NECK:  No jugular venous distention, waveform within normal limits, carotid upstroke brisk and symmetric, no bruits, no thyromegaly LYMPHATICS:  No cervical, inguinal adenopathy LUNGS:  Clear to auscultation bilaterally BACK:  No CVA tenderness CHEST:  Unremarkable HEART:  PMI not displaced or sustained,S1 and S2 within normal limits, no S3, no S4, no clicks, no rubs, no  murmurs ABD:  Flat, positive bowel sounds normal in frequency in pitch, no bruits, no rebound, no guarding, no midline pulsatile mass, no hepatomegaly, no splenomegaly EXT:  2 plus pulses throughout, no edema, no cyanosis no clubbing SKIN:  No rashes no nodules NEURO:  Cranial nerves II  through XII grossly intact, motor grossly intact throughout [facility]:  Cognitively intact, oriented to person place and time    EKG:  EKG is ordered today. The ekg ordered today demonstrates sinus rhythm, left bundle branch block unchanged from previous   Recent Labs: 05/05/2022: ALT 20; BUN 18; Creatinine, Ser 1.20; Hemoglobin 15.5; Platelets 222.0; Potassium 4.6; Sodium 142    Lipid Panel    Component Value Date/Time   CHOL 173 05/05/2022 1007   TRIG 218.0 (H) 05/05/2022 1007   HDL 39.20 05/05/2022 1007   CHOLHDL 4 05/05/2022 1007   VLDL 43.6 (H) 05/05/2022 1007   LDLDIRECT 106.0 05/05/2022 1007      Wt Readings from Last 3 Encounters:  05/26/22 269 lb 9.6 oz (122.3 kg)  05/05/22 264 lb (119.7 kg)  04/18/22 267 lb (121.1 kg)      Other studies Reviewed: Additional studies/ records that were reviewed today include: Primary care office records, previous cardiology records, recent echocardiogram. Review of the above records demonstrates:  Please see elsewhere in the note.     ASSESSMENT AND PLAN:  SOB: I am going to check a D-dimer.  I will add a BNP.  If the D-dimer is abnormal I will have a low threshold for CT although I do not strongly suspect pulmonary emboli.  I am going to check coronary CTA.  If all this work-up is unremarkable the neck step would be pulmonary function testing and referral to pulmonary.  LBBB: This is chronic.  I do not think this is contributing.  Dyslipidemia: LDL previously was 106.  He does have hypertriglyceridemia but he has been put on a new fish oil.  I will defer to his primary provider.  HTN: Blood pressure is controlled.  Continue meds as  listed.  Obesity: We talked about diet and exercise.   Current medicines are reviewed at length with the patient today.  The patient does not have concerns regarding medicines.  The following changes have been made:  no change  Labs/ tests ordered today include:   Orders Placed This Encounter  Procedures   CT CORONARY MORPH W/CTA COR W/SCORE W/CA W/CM &/OR WO/CM   Basic metabolic panel   D-dimer, quantitative   Brain natriuretic peptide   EKG 12-Lead     Disposition:   FU with me as needed based on the results of the above   Signed, Minus Breeding, MD  05/26/2022 9:43 AM    [place]

## 2022-05-26 ENCOUNTER — Ambulatory Visit: Payer: PPO | Attending: Cardiology | Admitting: Cardiology

## 2022-05-26 ENCOUNTER — Encounter: Payer: Self-pay | Admitting: Cardiology

## 2022-05-26 VITALS — BP 125/78 | HR 85 | Ht 69.0 in | Wt 269.6 lb

## 2022-05-26 DIAGNOSIS — R072 Precordial pain: Secondary | ICD-10-CM | POA: Diagnosis not present

## 2022-05-26 DIAGNOSIS — I10 Essential (primary) hypertension: Secondary | ICD-10-CM

## 2022-05-26 DIAGNOSIS — E785 Hyperlipidemia, unspecified: Secondary | ICD-10-CM

## 2022-05-26 MED ORDER — METOPROLOL TARTRATE 100 MG PO TABS: ORAL_TABLET | ORAL | 0 refills | Status: DC

## 2022-05-26 NOTE — Patient Instructions (Addendum)
Medication Instructions:  Take Metoprolol 100 mg two hours before CT when scheduled.   *If you need a refill on your cardiac medications before your next appointment, please call your pharmacy*   Lab Work:  BMET today   If you have labs (blood work) drawn today and your tests are completely normal, you will receive your results only by: [place] (if you have MyChart) OR A paper copy in the mail If you have any lab test that is abnormal or we need to change your treatment, we will call you to review the results.   Testing/Procedures: Your physician has requested that you have cardiac CT. Cardiac computed tomography (CT) is a painless test that uses an x-ray machine to take clear, detailed pictures of your heart. For further information please visit [email or web address].[email or web address]. Please follow instruction sheet as given.   Follow-Up: At [facility], you and your health needs are our priority.  As part of our continuing mission to provide you with exceptional heart care, we have created designated Provider Care Teams.  These Care Teams include your primary Cardiologist (physician) and Advanced Practice Providers (APPs -  Physician Assistants and Nurse Practitioners) who all work together to provide you with the care you need, when you need it.  We recommend signing up for the patient portal called "MyChart".  Sign up information is provided on this After Visit Summary.  MyChart is used to connect with patients for Virtual Visits (Telemedicine).  Patients are able to view lab/test results, encounter notes, upcoming appointments, etc.  Non-urgent messages can be sent to your provider as well.   To learn more about what you can do with MyChart, go to [email or web address].    Your next appointment:   As needed  The format for your next appointment:   In Person  Provider:   Minus Breeding, MD   Other Instructions    Your cardiac CT will be scheduled at one of the below  locations:   [facility] [address] [place], [place] [address] [phone]  If scheduled at [facility], please arrive at the [facility] and Children's Entrance (Entrance C2) of [facility] 30 minutes prior to test start time. You can use the FREE valet parking offered at entrance C (encouraged to control the heart rate for the test)  Proceed to the [facility] Radiology Department (first floor) to check-in and test prep.  All radiology patients and guests should use entrance C2 at [facility], accessed from [facility], even though the hospital's physical address listed is [address].     Please follow these instructions carefully (unless otherwise directed):  Hold all erectile dysfunction medications at least 3 days (72 hrs) prior to test.  On the Night Before the Test: Be sure to Drink plenty of water. Do not consume any caffeinated/decaffeinated beverages or chocolate 12 hours prior to your test. Do not take any antihistamines 12 hours prior to your test.  On the Day of the Test: Drink plenty of water until 1 hour prior to the test. Do not eat any food 4 hours prior to the test. You may take your regular medications prior to the test.  Take metoprolol (Lopressor) two hours prior to test. HOLD Furosemide/Hydrochlorothiazide morning of the test. FEMALES- please wear underwire-free bra if available, avoid dresses & tight clothing  After the Test: Drink plenty of water. After receiving IV contrast, you may experience a mild flushed feeling. This is normal.  On occasion, you may experience a mild rash up to 24 hours after the test. This is not dangerous. If this occurs, you can take Benadryl 25 mg and increase your fluid intake. If you experience trouble breathing, this can be serious. If it is severe call 911 IMMEDIATELY. If it is mild, please call our office. If you take any of these medications: Glipizide/Metformin,  Avandament, Glucavance, please do not take 48 hours after completing test unless otherwise instructed.  We will call to schedule your test 2-4 weeks out understanding that some insurance companies will need an authorization prior to the service being performed.   For non-scheduling related questions, please contact the cardiac imaging nurse navigator should you have any questions/concerns: Marchia Bond, Cardiac Imaging Nurse Navigator Gordy Clement, Cardiac Imaging Nurse Navigator [facility] Direct Office Dial: [phone]   For scheduling needs, including cancellations and rescheduling, please call [place], [phone].

## 2022-05-27 ENCOUNTER — Telehealth: Payer: Self-pay | Admitting: *Deleted

## 2022-05-27 DIAGNOSIS — R7989 Other specified abnormal findings of blood chemistry: Secondary | ICD-10-CM

## 2022-05-27 LAB — D-DIMER, QUANTITATIVE: D-DIMER: 1.4 mg/L FEU — ABNORMAL HIGH (ref 0.00–0.49)

## 2022-05-27 LAB — BRAIN NATRIURETIC PEPTIDE: BNP: 38.5 pg/mL (ref 0.0–100.0)

## 2022-05-27 LAB — BASIC METABOLIC PANEL
BUN/Creatinine Ratio: 12 (ref 10–24)
BUN: 14 mg/dL (ref 8–27)
CO2: 21 mmol/L (ref 20–29)
Calcium: 9.6 mg/dL (ref 8.6–10.2)
Chloride: 106 mmol/L (ref 96–106)
Creatinine, Ser: 1.13 mg/dL (ref 0.76–1.27)
Glucose: 121 mg/dL — ABNORMAL HIGH (ref 70–99)
Potassium: 4.3 mmol/L (ref 3.5–5.2)
Sodium: 146 mmol/L — ABNORMAL HIGH (ref 134–144)
eGFR: 66 mL/min/{1.73_m2} (ref >59)

## 2022-05-27 NOTE — Telephone Encounter (Signed)
-----   Message from Minus Breeding, MD sent at 05/27/2022  1:01 PM EDT ----- Please arrange a CT to rule out PE.  He has had chronic complaints of not pressuring him in for urgent CT but would like to have it done electively as soon as possible  Call Mr. Marasigan with the results and send results to [name], NP

## 2022-05-27 NOTE — Telephone Encounter (Signed)
Spoke with pt, aware of results. He is to have a coronary CT on 9/25 and per dr hochrein, that was cx and the patient will be scheduled for a CT to R/O PE. He will have a CTA for PE Tuesday 05/31/22 at 4:30 pm. He knows to arrive at [facility] at 4 pm. He is aware no solids 4 hours prior to the scan, liquids only.

## 2022-05-31 ENCOUNTER — Ambulatory Visit (HOSPITAL_COMMUNITY)
Admission: RE | Admit: 2022-05-31 | Discharge: 2022-05-31 | Disposition: A | Discharge status: Home / Self Care | Payer: PPO | Source: Ambulatory Visit | Attending: Cardiology | Admitting: Cardiology

## 2022-05-31 DIAGNOSIS — R7989 Other specified abnormal findings of blood chemistry: Secondary | ICD-10-CM | POA: Diagnosis not present

## 2022-05-31 DIAGNOSIS — R0602 Shortness of breath: Secondary | ICD-10-CM | POA: Diagnosis not present

## 2022-05-31 DIAGNOSIS — R079 Chest pain, unspecified: Secondary | ICD-10-CM | POA: Diagnosis not present

## 2022-05-31 MED ORDER — IOHEXOL 350 MG/ML SOLN
100.0000 mL | Freq: Once | INTRAVENOUS | Status: AC | PRN
  Administered 2022-05-31: 100 mL via INTRAVENOUS

## 2022-05-31 MED ORDER — SODIUM CHLORIDE (PF) 0.9 % IJ SOLN
INTRAMUSCULAR | Status: AC
  Filled 2022-05-31: qty 50

## 2022-06-01 ENCOUNTER — Other Ambulatory Visit: Payer: Self-pay | Admitting: *Deleted

## 2022-06-01 DIAGNOSIS — R0602 Shortness of breath: Secondary | ICD-10-CM

## 2022-06-06 ENCOUNTER — Encounter (HOSPITAL_COMMUNITY): Payer: PPO

## 2022-06-08 ENCOUNTER — Ambulatory Visit (HOSPITAL_COMMUNITY)
Admission: RE | Admit: 2022-06-08 | Discharge: 2022-06-08 | Disposition: A | Discharge status: Home / Self Care | Payer: PPO | Source: Ambulatory Visit | Attending: Cardiology | Admitting: Cardiology

## 2022-06-08 DIAGNOSIS — R0602 Shortness of breath: Secondary | ICD-10-CM | POA: Diagnosis not present

## 2022-06-08 LAB — PULMONARY FUNCTION TEST
DL/VA % pred: 101 %
DL/VA: 3.98 ml/min/mmHg/L
DLCO unc % pred: 112 %
DLCO unc: 26.85 ml/min/mmHg
FEF 25-75 Post: 3.35 L/sec
FEF 25-75 Pre: 3.22 L/sec
FEF2575-%Change-Post: 4 %
FEF2575-%Pred-Post: 173 %
FEF2575-%Pred-Pre: 166 %
FEV1-%Change-Post: 1 %
FEV1-%Pred-Post: 127 %
FEV1-%Pred-Pre: 125 %
FEV1-Post: 3.55 L
FEV1-Pre: 3.49 L
FEV1FVC-%Change-Post: 1 %
FEV1FVC-%Pred-Pre: 110 %
FEV6-%Change-Post: 1 %
FEV6-%Pred-Post: 121 %
FEV6-%Pred-Pre: 120 %
FEV6-Post: 4.44 L
FEV6-Pre: 4.39 L
FEV6FVC-%Change-Post: 0 %
FEV6FVC-%Pred-Post: 107 %
FEV6FVC-%Pred-Pre: 106 %
FVC-%Change-Post: 0 %
FVC-%Pred-Post: 113 %
FVC-%Pred-Pre: 113 %
FVC-Post: 4.44 L
FVC-Pre: 4.42 L
Post FEV1/FVC ratio: 80 %
Post FEV6/FVC ratio: 100 %
Pre FEV1/FVC ratio: 79 %
Pre FEV6/FVC Ratio: 99 %
RV % pred: 119 %
RV: 3.08 L
TLC % pred: 110 %
TLC: 7.57 L

## 2022-06-08 MED ORDER — ALBUTEROL SULFATE (2.5 MG/3ML) 0.083% IN NEBU
2.5000 mg | INHALATION_SOLUTION | Freq: Once | RESPIRATORY_TRACT | Status: AC
  Administered 2022-06-08: 2.5 mg via RESPIRATORY_TRACT

## 2022-06-13 ENCOUNTER — Other Ambulatory Visit (HOSPITAL_COMMUNITY): Payer: PPO

## 2022-06-16 ENCOUNTER — Ambulatory Visit (HOSPITAL_BASED_OUTPATIENT_CLINIC_OR_DEPARTMENT_OTHER): Payer: PPO | Admitting: *Deleted

## 2022-06-16 ENCOUNTER — Encounter (HOSPITAL_COMMUNITY): Payer: Self-pay | Admitting: *Deleted

## 2022-06-16 VITALS — BP 159/71 | HR 77 | Resp 20 | Ht 69.0 in | Wt 266.8 lb

## 2022-06-16 DIAGNOSIS — F319 Bipolar disorder, unspecified: Secondary | ICD-10-CM

## 2022-06-16 NOTE — Patient Instructions (Addendum)
Pt presents today for due Abilify Maintena 400 mg. Pt is cooperative and appropriate on approach. Pt does seem a bit more anxious and depressed than usual presentation. Pt expressed concerns regarding his ongoing SOB without any explanation, as of yet, as to what is causing (seemingly) pulmonary issues. Pt states that he will be having an Endoscopy and is hoping that will give some answers. Injection prepared as ordered and given in LUOQ without any complaint. Pt to return in approximately 28 days for next due injection. Pt will call with any questions or concerns prior to next visit he says.

## 2022-06-28 DIAGNOSIS — H02831 Dermatochalasis of right upper eyelid: Secondary | ICD-10-CM | POA: Diagnosis not present

## 2022-06-28 DIAGNOSIS — D485 Neoplasm of uncertain behavior of skin: Secondary | ICD-10-CM | POA: Diagnosis not present

## 2022-06-28 DIAGNOSIS — L821 Other seborrheic keratosis: Secondary | ICD-10-CM | POA: Diagnosis not present

## 2022-06-28 DIAGNOSIS — H02834 Dermatochalasis of left upper eyelid: Secondary | ICD-10-CM | POA: Diagnosis not present

## 2022-07-06 ENCOUNTER — Encounter (HOSPITAL_COMMUNITY): Payer: Self-pay | Admitting: Gastroenterology

## 2022-07-07 ENCOUNTER — Encounter (HOSPITAL_COMMUNITY): Payer: Self-pay | Admitting: Gastroenterology

## 2022-07-07 ENCOUNTER — Telehealth: Payer: Self-pay

## 2022-07-07 NOTE — Telephone Encounter (Signed)
   [facility] Pre-operative Risk Assessment     Request for surgical clearance:     Endoscopy Procedure  What type of surgery is being performed?     EGD  When is this surgery scheduled?     07/14/22 at [facility]  What type of clearance is required ?  Medical   Are there any medications that need to be held prior to surgery and how long? No. We are trying to see if patient is cleared to have this procedure  Practice name and name of physician performing surgery?  [facility]  What is your office phone and fax number?      Phone- [phone]  [phone]  Anesthesia type (None, local, MAC, general) ?       MAC

## 2022-07-07 NOTE — Telephone Encounter (Signed)
     Primary Cardiologist: Minus Breeding, MD  Chart reviewed as part of pre-operative protocol coverage. Given past medical history and time since last visit, based on ACC/AHA guidelines, OSIAS RESNICK would be at acceptable risk for the planned procedure without further cardiovascular testing.    I will route this recommendation to the requesting party via Epic fax function and remove from pre-op pool.  Please call with questions.  Jossie Ng. Marquitta Persichetti NP-C     07/07/2022, 4:03 PM [facility] [address] Office [phone] Fax [phone]

## 2022-07-13 DIAGNOSIS — H43812 Vitreous degeneration, left eye: Secondary | ICD-10-CM | POA: Diagnosis not present

## 2022-07-13 DIAGNOSIS — H353232 Exudative age-related macular degeneration, bilateral, with inactive choroidal neovascularization: Secondary | ICD-10-CM | POA: Diagnosis not present

## 2022-07-14 ENCOUNTER — Ambulatory Visit (HOSPITAL_COMMUNITY): Payer: PPO | Admitting: Physician Assistant

## 2022-07-14 ENCOUNTER — Ambulatory Visit (HOSPITAL_BASED_OUTPATIENT_CLINIC_OR_DEPARTMENT_OTHER): Payer: PPO | Admitting: Physician Assistant

## 2022-07-14 ENCOUNTER — Ambulatory Visit (HOSPITAL_COMMUNITY)
Admission: RE | Admit: 2022-07-14 | Discharge: 2022-07-14 | Disposition: A | Discharge status: Home / Self Care | Payer: PPO | Attending: Gastroenterology | Admitting: Gastroenterology

## 2022-07-14 ENCOUNTER — Other Ambulatory Visit: Payer: Self-pay

## 2022-07-14 ENCOUNTER — Encounter (HOSPITAL_COMMUNITY): Payer: Self-pay | Admitting: Gastroenterology

## 2022-07-14 ENCOUNTER — Encounter (HOSPITAL_COMMUNITY)
Admission: RE | Disposition: A | Discharge status: Home / Self Care | Payer: Self-pay | Source: Home / Self Care | Attending: Gastroenterology

## 2022-07-14 DIAGNOSIS — K219 Gastro-esophageal reflux disease without esophagitis: Secondary | ICD-10-CM | POA: Insufficient documentation

## 2022-07-14 DIAGNOSIS — F319 Bipolar disorder, unspecified: Secondary | ICD-10-CM | POA: Insufficient documentation

## 2022-07-14 DIAGNOSIS — K449 Diaphragmatic hernia without obstruction or gangrene: Secondary | ICD-10-CM

## 2022-07-14 DIAGNOSIS — I10 Essential (primary) hypertension: Secondary | ICD-10-CM | POA: Insufficient documentation

## 2022-07-14 DIAGNOSIS — K319 Disease of stomach and duodenum, unspecified: Secondary | ICD-10-CM | POA: Diagnosis not present

## 2022-07-14 DIAGNOSIS — R1310 Dysphagia, unspecified: Secondary | ICD-10-CM | POA: Diagnosis not present

## 2022-07-14 DIAGNOSIS — K2289 Other specified disease of esophagus: Secondary | ICD-10-CM | POA: Insufficient documentation

## 2022-07-14 DIAGNOSIS — T884XXD Failed or difficult intubation, subsequent encounter: Secondary | ICD-10-CM

## 2022-07-14 DIAGNOSIS — K317 Polyp of stomach and duodenum: Secondary | ICD-10-CM | POA: Diagnosis not present

## 2022-07-14 DIAGNOSIS — J45909 Unspecified asthma, uncomplicated: Secondary | ICD-10-CM | POA: Diagnosis not present

## 2022-07-14 DIAGNOSIS — K2950 Unspecified chronic gastritis without bleeding: Secondary | ICD-10-CM | POA: Diagnosis not present

## 2022-07-14 DIAGNOSIS — Z7984 Long term (current) use of oral hypoglycemic drugs: Secondary | ICD-10-CM | POA: Insufficient documentation

## 2022-07-14 DIAGNOSIS — E119 Type 2 diabetes mellitus without complications: Secondary | ICD-10-CM | POA: Insufficient documentation

## 2022-07-14 DIAGNOSIS — T884XXA Failed or difficult intubation, initial encounter: Secondary | ICD-10-CM

## 2022-07-14 DIAGNOSIS — Z86010 Personal history of colonic polyps: Secondary | ICD-10-CM

## 2022-07-14 DIAGNOSIS — R1319 Other dysphagia: Secondary | ICD-10-CM

## 2022-07-14 HISTORY — PX: ESOPHAGOGASTRODUODENOSCOPY (EGD) WITH PROPOFOL: SHX5813

## 2022-07-14 HISTORY — PX: POLYPECTOMY: SHX5525

## 2022-07-14 HISTORY — PX: BIOPSY: SHX5522

## 2022-07-14 HISTORY — PX: HEMOSTASIS CLIP PLACEMENT: SHX6857

## 2022-07-14 HISTORY — PX: SAVORY DILATION: SHX5439

## 2022-07-14 LAB — GLUCOSE, CAPILLARY
Glucose-Capillary: 115 mg/dL — ABNORMAL HIGH (ref 70–99)
Glucose-Capillary: 103 mg/dL — ABNORMAL HIGH (ref 70–99)

## 2022-07-14 SURGERY — ESOPHAGOGASTRODUODENOSCOPY (EGD) WITH PROPOFOL
Anesthesia: Monitor Anesthesia Care

## 2022-07-14 MED ORDER — PROPOFOL 500 MG/50ML IV EMUL
INTRAVENOUS | Status: DC | PRN
  Administered 2022-07-14: 125 ug/kg/min via INTRAVENOUS

## 2022-07-14 MED ORDER — LACTATED RINGERS IV SOLN: INTRAVENOUS | Status: DC

## 2022-07-14 MED ORDER — PROPOFOL 10 MG/ML IV BOLUS
INTRAVENOUS | Status: DC | PRN
  Administered 2022-07-14 (×2): 20 mg via INTRAVENOUS
  Administered 2022-07-14: 30 mg via INTRAVENOUS
  Administered 2022-07-14: 20 mg via INTRAVENOUS

## 2022-07-14 MED ORDER — SODIUM CHLORIDE 0.9 % IV SOLN: INTRAVENOUS | Status: DC

## 2022-07-14 MED ORDER — PROPOFOL 1000 MG/100ML IV EMUL
INTRAVENOUS | Status: AC
  Filled 2022-07-14: qty 100

## 2022-07-14 SURGICAL SUPPLY — 15 items

## 2022-07-14 NOTE — H&P (Signed)
GASTROENTEROLOGY PROCEDURE H&P NOTE   Primary Care Physician: [name], NP    Reason for Procedure:  Dysphagia, GERD, hiatal hernia  Plan:    EGD with dilation  Patient is appropriate for endoscopic procedure(s) at [facility].  The nature of the procedure, as well as the risks, benefits, and alternatives were carefully and thoroughly reviewed with the patient. Ample time for discussion and questions allowed. The patient understood, was satisfied, and agreed to proceed.     HPI: Sean Hill is a 81 y.o. male who presents for EGD for evaluation of dysphagia, along with history of GERD and hiatal hernia.  Dysphagia worse with rice, popcorn, pointing to mid/lower sternum.  Endoscopic history: - Positive Cologuard in 2016 -Colonoscopy (08/2019, Dr. 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 Bryan Lemma): 3 cm HH, irregular Z-line (biopsy: Reflux changes without intestinal metaplasia), Gastric hyperplastic polyps.  Empiric dilation with 51 Fr Savary -CT abdomen/pelvis (09/30/2019): No discrete mass.  No lymphadenopathy. -Colonoscopy (10/02/2019, Dr. Bryan Lemma): EMR of 25 mm polyp in ascending colon (TVA), left-sided diverticulosis, internal hemorrhoids -Admitted January 19-21, 2021 with post polypectomy bleed.  Hemodynamically stable, stable hemoglobin.  No blood products needed -Colonoscopy (10/09/2019, Dr. Carlean Purl): 20 mm ulcer in proximal ascending colon without active bleeding but positive stigmata.  Treated with 11 clips.  Difficult positioning and significant respiratory-dependent motion in colon. - Colonoscopy (04/29/2020): Tattoo in ascending colon.  Single retained clip in proximal ascending colon 1 cm away from post polypectomy scar.  Underlying tissue with polypoid appearance (path: Inflammatory tissue).  Subtle  nodularity around post polypectomy site (path:).  2 mm ascending colon adenoma, 3 mm sigmoid adenoma.  Sigmoid diverticulosis, internal hemorrhoids.  Recommended repeat in 1 year - Colonoscopy (11/11/2021): 5 mm transverse colon polyp (path: Tubular adenoma), Diverticulosis, tattoo and post polypectomy scar in ascending colon.  Repeat 3 years  Past Medical History:  Diagnosis Date   Anal fissure    Anxiety    Arthritis    right wrist; pt. states "everywhere"   Asthma    Bipolar disorder ([place])    Cataract    Depression    Difficult airway for intubation    GERD (gastroesophageal reflux disease)    Hemorrhage of colon following colonoscopy 10/08/2019   History of kidney stones    History of MRSA infection    left knee after arthroplasty   Hyperlipidemia    Hypertension    states under control with med., has been on med. x 1 yr.   Impaired hearing    Left bundle branch block (LBBB) on electrocardiogram 10/29/2015   Macular degeneration (senile) of retina    left   Pre-diabetes     Past Surgical History:  Procedure Laterality Date   ANKLE FUSION Bilateral    ANKLE SURGERY Bilateral    ligament surgery   BIOPSY  04/29/2020   Procedure: BIOPSY;  Surgeon: Lavena Bullion, DO;  Location: [facility];  Service: Gastroenterology;;   CARDIAC CATHETERIZATION  x 2   1983; 11/14/2003   CARPOMETACARPEL SUSPENSION PLASTY Right 09/22/2016   Procedure: right thumb carpometacarpal  ARTHROPLASTY;  Surgeon: Leanora Cover, MD;  Location: [place];  Service: Orthopedics;  Laterality: Right;  right thumb carpometacarpal  ARTHROPLASTY   CARPOMETACARPEL SUSPENSION PLASTY Right 11/02/2017   Procedure: RIGHT THUMB SUSPENSIONPLASTY WITH TIGHTROPE, DISTAL POLE SCAPHOID  EXCISION;  Surgeon: Leanora Cover, MD;  Location:  [place];  Service: Orthopedics;  Laterality: Right;   COLONOSCOPY WITH PROPOFOL N/A 10/02/2019   Procedure: COLONOSCOPY WITH PROPOFOL;  Surgeon:  Lavena Bullion, DO;  Location: [facility];  Service: Gastroenterology;  Laterality: N/A;   COLONOSCOPY WITH PROPOFOL N/A 10/09/2019   Procedure: COLONOSCOPY WITH PROPOFOL;  Surgeon: Gatha Mayer, MD;  Location: [facility];  Service: Endoscopy;  Laterality: N/A;   COLONOSCOPY WITH PROPOFOL N/A 04/29/2020   Procedure: COLONOSCOPY WITH PROPOFOL;  Surgeon: Lavena Bullion, DO;  Location: [facility];  Service: Gastroenterology;  Laterality: N/A;   COLONOSCOPY WITH PROPOFOL N/A 11/11/2021   Procedure: COLONOSCOPY WITH PROPOFOL;  Surgeon: Lavena Bullion, DO;  Location: [facility];  Service: Gastroenterology;  Laterality: N/A;   ELBOW SURGERY Left    ENDOSCOPIC MUCOSAL RESECTION N/A 10/02/2019   Procedure: ENDOSCOPIC MUCOSAL RESECTION;  Surgeon: Lavena Bullion, DO;  [facility]: [facility];  Service: Gastroenterology;  Laterality: N/A;   HEMOSTASIS CLIP PLACEMENT  10/09/2019   Procedure: HEMOSTASIS CLIP PLACEMENT;  Surgeon: Gatha Mayer, MD;  Location: [facility];  Service: Endoscopy;;   HEMOSTASIS CLIP PLACEMENT  11/11/2021   Procedure: HEMOSTASIS CLIP PLACEMENT;  Surgeon: Lavena Bullion, DO;  Location: [facility];  Service: Gastroenterology;;   INGUINAL HERNIA REPAIR Right    KNEE SURGERY     OPEN REDUCTION INTERNAL FIXATION (ORIF) DISTAL RADIAL FRACTURE Right 10/29/2015   Procedure: OPEN REDUCTION INTERNAL FIXATION (ORIF) DISTAL RADIAL FRACTURE;  Surgeon: Leanora Cover, MD;  Location: [place];  Service: Orthopedics;  Laterality: Right;   ORIF ELBOW FRACTURE Right    POLYPECTOMY  04/29/2020   Procedure: POLYPECTOMY;  Surgeon: Lavena Bullion, DO;  [facility]: [facility];  Service: Gastroenterology;;   POLYPECTOMY  11/11/2021   Procedure: POLYPECTOMY;  Surgeon: Lavena Bullion, DO;  Location: [facility];  Service: Gastroenterology;;   SUBMUCOSAL LIFTING INJECTION  10/02/2019   Procedure: SUBMUCOSAL LIFTING INJECTION;  Surgeon: Lavena Bullion, DO;  Location: [facility];  Service: Gastroenterology;;   TONSILLECTOMY     TOTAL KNEE ARTHROPLASTY Bilateral    TOTAL KNEE REVISION Right 08/25/2021   Procedure: Right knee polyethylene vs total knee arthroplasty revision;  Surgeon: Gaynelle Arabian, MD;  Location: [facility];  Service: Orthopedics;  Laterality: Right;   ULNAR COLLATERAL LIGAMENT REPAIR Right 12/29/2015   Procedure: REPAIR  RIGHT LATERAL ULNAR COLLATERAL LIGAMENT TEAR  EXTENSOR ORIGIN ;  Surgeon: Leanora Cover, MD;  Location: [place];  Service: Orthopedics;  Laterality: Right;   WRIST ARTHROSCOPY WITH DEBRIDEMENT Right 07/14/2016   Procedure: RIGHT WRIST ARTHROSCOPY WITH DEBRIDEMENT  TRIANGULAR FIBROCARTILAGE COMPLEX;  Surgeon: Leanora Cover, MD;  Location: [place];  Service: Orthopedics;  Laterality: Right;    Prior to Admission medications   Medication Sig Start Date End Date Taking? Authorizing Provider  ARIPiprazole ER (ABILIFY MAINTENA) 400 MG PRSY prefilled syringe Inject 400 mg into the muscle every 28 (twenty-eight) days. 04/28/22 04/28/23 Yes Arfeen, Arlyce Harman, MD  azelastine (ASTELIN) 0.1 % nasal spray Place 2 sprays into both nostrils 2 (two) times daily. Use in each nostril as directed 11/30/21  Yes Nafziger, Tommi Rumps, NP  benztropine (COGENTIN) 0.5 MG tablet Take 1 tablet (0.5 mg total) by mouth daily. 04/28/22  Yes Arfeen, Arlyce Harman, MD  cetirizine (ZYRTEC) 10 MG tablet Take 10 mg by mouth every morning.   Yes [provider]  citalopram (CELEXA) 20 MG tablet Take 1 tablet (20 mg total) by mouth daily. 04/28/22 04/28/23 Yes  Arfeen, Arlyce Harman, MD  ezetimibe (ZETIA) 10 MG tablet TAKE ONE TABLET BY MOUTH AT NOON 04/29/22  Yes Nafziger, Tommi Rumps, NP  fenofibrate (TRICOR) 145 MG tablet TAKE ONE TABLET BY MOUTH ONCE DAILY 04/29/22  Yes Nafziger, Tommi Rumps, NP  icosapent Ethyl (VASCEPA) 1 g capsule Take 2 g by mouth 2 (two) times daily.   Yes [provider]  losartan (COZAAR) 50 MG tablet TAKE ONE  TABLET BY MOUTH EVERY MORNING 04/29/22  Yes Nafziger, Tommi Rumps, NP  metFORMIN (GLUCOPHAGE) 500 MG tablet Take 1 tablet (500 mg total) by mouth daily. 05/06/22  Yes Nafziger, Tommi Rumps, NP  metoprolol tartrate (LOPRESSOR) 100 MG tablet Take 1 tablet by mouth once for procedure. 05/26/22  Yes Minus Breeding, MD  Multiple Vitamin (MULTIVITAMIN WITH MINERALS) TABS tablet Take 1 tablet by mouth daily. 50 +   Yes [provider]  Multiple Vitamins-Minerals (PRESERVISION AREDS 2 PO) Take 1 capsule by mouth 2 (two) times daily.   Yes [provider]  naproxen sodium (ALEVE) 220 MG tablet Take 220 mg by mouth daily as needed (pain).   Yes [provider]  pantoprazole (PROTONIX) 40 MG tablet TAKE ONE TABLET BY MOUTH TWICE DAILY 04/28/22  Yes Zaire Levesque V, DO  tamsulosin (FLOMAX) 0.4 MG CAPS capsule Take 2 capsules (0.8 mg total) by mouth daily. 05/09/22  Yes Nafziger, Tommi Rumps, NP  temazepam (RESTORIL) 15 MG capsule Take 1 capsule (15 mg total) by mouth at bedtime. 04/28/22  Yes Arfeen, Arlyce Harman, MD  Vitamin D, Cholecalciferol, 1000 UNITS TABS Take 1,000 Units by mouth daily.    Yes [provider]  albuterol (VENTOLIN HFA) 108 (90 Base) MCG/ACT inhaler Inhale 2 puffs into the lungs every 6 (six) hours as needed for wheezing or shortness of breath. 12/29/20   Nafziger, Tommi Rumps, NP  ipratropium-albuterol (DUONEB) 0.5-2.5 (3) MG/3ML SOLN Take 3 mLs by nebulization every 6 (six) hours as needed. Patient taking differently: Take 3 mLs by nebulization every 6 (six) hours as needed (shortness of breath). 09/03/21   Nafziger, Tommi Rumps, NP    Current Facility-Administered Medications  Medication Dose Route Frequency Provider Last Rate Last Admin   0.9 %  sodium chloride infusion   Intravenous Continuous Keylie Beavers V, DO       lactated ringers infusion   Intravenous Continuous Kingsley Farace V, DO 10 mL/hr at 07/14/22 2725 Restarted at 07/14/22 0714    Allergies as of 04/18/2022 - Review  Complete 04/18/2022  Allergen Reaction Noted   Penicillins Other (See Comments) 07/20/2011   Lisinopril Diarrhea and Cough 06/19/2020   Codeine Rash 04/23/2013    Family History  Problem Relation Age of Onset   Alcohol abuse Mother    Ovarian cancer Mother    Alcohol abuse Father    Pancreatic cancer Father    Hyperlipidemia Brother    Barrett's esophagus Brother    Colon cancer Maternal Grandmother    Depression Daughter    Paranoid behavior Daughter    Esophageal cancer Neg Hx    Stomach cancer Neg Hx    Rectal cancer Neg Hx    Colon polyps Neg Hx     Social History   Socioeconomic History   Marital status: Married    Spouse name: Not on file   Number of children: Not on file   Years of education: Not on file   Highest education level: Not on file  Occupational History   Not on file  Tobacco Use   Smoking status: Never  Smokeless tobacco: Never  Vaping Use   Vaping Use: Never used  Substance and Sexual Activity   Alcohol use: Not Currently    Comment: occasional.   Drug use: No   Sexual activity: Not Currently    Birth control/protection: None  Other Topics Concern   Not on file  Social History Narrative   Married, lives in [place].  Retired Dance movement psychotherapist.   Two daughters, both married,3 grandchildren    No regular exercise.  Never smoker.  No ETOH.  No drugs.   Social Determinants of Health   Financial Resource Strain: Low Risk  (09/23/2021)   Overall Financial Resource Strain (CARDIA)    Difficulty of Paying Living Expenses: Not hard at all  Food Insecurity: No Food Insecurity (09/23/2021)   Hunger Vital Sign    Worried About Running Out of Food in the Last Year: Never true    Ran Out of Food in the Last Year: Never true  Transportation Needs: No Transportation Needs (09/23/2021)   PRAPARE - Hydrologist (Medical): No    Lack of Transportation (Non-Medical): No  Physical Activity: Inactive (09/23/2021)   Exercise Vital Sign     Days of Exercise per Week: 0 days    Minutes of Exercise per Session: 0 min  Stress: No Stress Concern Present (09/23/2021)   [place]    Feeling of Stress : Not at all  Social Connections: Moderately Isolated (09/23/2021)   Social Connection and Isolation Panel [NHANES]    Frequency of Communication with Friends and Family: More than three times a week    Frequency of Social Gatherings with Friends and Family: More than three times a week    Attends Religious Services: Never    Marine scientist or Organizations: No    Attends Archivist Meetings: Never    Marital Status: Married  Human resources officer Violence: Not At Risk (09/23/2021)   Humiliation, Afraid, Rape, and Kick questionnaire    Fear of Current or Ex-Partner: No    Emotionally Abused: No    Physically Abused: No    Sexually Abused: No    Physical Exam: Vital signs in last 24 hours: '@BP'$  138/62   Pulse 68   Temp 97.8 F (36.6 C) (Temporal)   Resp 17   Ht '5\' 9"'$  (1.753 m)   Wt 117.9 kg   SpO2 95%   BMI 38.40 kg/m  GEN: NAD EYE: Sclerae anicteric ENT: MMM CV: Non-tachycardic Pulm: CTA b/l GI: Soft, NT/ND NEURO:  Alert & Oriented x 3   Gerrit Heck, DO [facility]   07/14/2022 7:28 AM

## 2022-07-14 NOTE — Anesthesia Procedure Notes (Signed)
Procedure Name: MAC Date/Time: 07/14/2022 7:30 AM  Performed by: Claudia Desanctis, CRNAPre-anesthesia Checklist: Patient identified, Emergency Drugs available, Suction available and Patient being monitored Patient Re-evaluated:Patient Re-evaluated prior to induction Oxygen Delivery Method: Simple face mask

## 2022-07-14 NOTE — Op Note (Signed)
[facility] Patient Name: Sean Hill Procedure Date: 07/14/2022 MRN: [id number] Attending MD: Gerrit Heck , MD, [id number] Date of Birth: 1941/04/21 CSN: [id number] Age: 81 Admit Type: Outpatient Procedure:                Upper GI endoscopy Indications:              Dysphagia, Esophageal reflux Providers:                Gerrit Heck, MD, Jaci Carrel, RN, Frazier Richards, Technician Referring MD:              Medicines:                Monitored Anesthesia Care Complications:            No immediate complications. Estimated Blood Loss:     Estimated blood loss was minimal. Procedure:                Pre-Anesthesia Assessment:                           - Prior to the procedure, a History and Physical                            was performed, and patient medications and                            allergies were reviewed. The patient's tolerance of                            previous anesthesia was also reviewed. The risks                            and benefits of the procedure and the sedation                            options and risks were discussed with the patient.                            All questions were answered, and informed consent                            was obtained. Prior Anticoagulants: The patient has                            taken no anticoagulant or antiplatelet agents. ASA                            Grade Assessment: III - A patient with severe                            systemic disease. After reviewing the risks and  benefits, the patient was deemed in satisfactory                            condition to undergo the procedure.                           After obtaining informed consent, the endoscope was                            passed under direct vision. Throughout the                            procedure, the patient's blood pressure, pulse, and                             oxygen saturations were monitored continuously. The                            [id number]-H190 ([id number]) Olympus endoscope was introduced                            through the mouth, and advanced to the second part                            of duodenum. The upper GI endoscopy was                            accomplished without difficulty. The patient                            tolerated the procedure well. Scope In: Scope Out: Findings:      A 3 cm hiatal hernia was present.      The upper third of the esophagus and middle third of the esophagus were       normal. A guidewire was placed and the scope was withdrawn. Dilation was       performed with a Savary dilator with no resistance at 17 mm and mild       resistance at 18 mm. The dilation site was examined following endoscope       reinsertion and showed no bleeding, mucosal tear or perforation.       Estimated blood loss: none.      The Z-line was irregular and was found 39 cm from the incisors. Biopsies       were taken with a cold forceps for histology. Estimated blood loss was       minimal. There was mild, but persistent oozing at one of the biopsy       sites. For hemostasis, one hemostatic clip was successfully placed (MR       conditional). There was no bleeding at the end of the procedure.      A few small sessile polyps with no bleeding were found in the gastric       fundus and in the gastric body. These polyps were removed with a cold       biopsy forceps. Resection and retrieval were complete. Estimated blood       loss was  minimal.      The incisura, gastric antrum and pylorus were normal.      The examined duodenum was normal. Impression:               - 3 cm hiatal hernia.                           - Normal upper third of esophagus and middle third                            of esophagus. Dilated with a 17 then 18 mm Savary                            dilator.                           - Z-line irregular, 39 cm from the  incisors.                            Biopsied. There was mild, but persistent oozing at                            one of the biopsy sites. Clip (MR conditional) was                            placed with complete hemostasis.                           - A few gastric polyps. Resected and retrieved.                           - Normal incisura, antrum and pylorus.                           - Normal examined duodenum. Moderate Sedation:      Not Applicable - Patient had care per Anesthesia. Recommendation:           - Patient has a contact number available for                            emergencies. The signs and symptoms of potential                            delayed complications were discussed with the                            patient. Return to normal activities tomorrow.                            Written discharge instructions were provided to the                            patient.                           -  Soft diet today then advance as tolerated                            tomorrow.                           - Continue present medications.                           - Await pathology results.                           - Repeat upper endoscopy PRN for retreatment.                           - If continued symptoms of dysphagia, plan for MBS                            and Esophageal Manometry. Procedure Code(s):        --- Professional ---                           [phone], 2, Esophagogastroduodenoscopy, flexible,                            transoral; with control of bleeding, any method                           43248, Esophagogastroduodenoscopy, flexible,                            transoral; with insertion of guide wire followed by                            passage of dilator(s) through esophagus over guide                            wire                           43239, 59, Esophagogastroduodenoscopy, flexible,                            transoral; with biopsy, single or  multiple Diagnosis Code(s):        --- Professional ---                           K44.9, Diaphragmatic hernia without obstruction or                            gangrene                           K22.89, Other specified disease of esophagus                           K31.7, Polyp of stomach and duodenum  R13.10, Dysphagia, unspecified                           K21.9, Gastro-esophageal reflux disease without                            esophagitis CPT copyright 2022 American Medical Association. All rights reserved. The codes documented in this report are preliminary and upon coder review may  be revised to meet current compliance requirements. Gerrit Heck, MD 07/14/2022 8:08:01 AM Number of Addenda: 0

## 2022-07-14 NOTE — Anesthesia Preprocedure Evaluation (Signed)
Anesthesia Evaluation  Patient identified by MRN, date of birth, ID band Patient awake    Reviewed: Allergy & Precautions, NPO status , Patient's Chart, lab work & pertinent test results  History of Anesthesia Complications (+) DIFFICULT AIRWAY and history of anesthetic complications  Airway Mallampati: IV  TM Distance: >3 FB Neck ROM: Full    Dental no notable dental hx. (+) Teeth Intact, Dental Advisory Given   Pulmonary asthma ,    Pulmonary exam normal breath sounds clear to auscultation       Cardiovascular hypertension, Pt. on medications Normal cardiovascular exam Rhythm:Regular Rate:Normal  TTE 2017 - Left ventricle: The cavity size was normal. Wall thickness was  increased in a pattern of mild LVH. Systolic function was normal.  The estimated ejection fraction was in the range of 60% to 65%.  Doppler parameters are consistent with abnormal left ventricular  relaxation (grade 1 diastolic dysfunction).  - Mitral valve: There was mild regurgitation.  - Pulmonary arteries: PA peak pressure: 33 mm Hg (S).   Neuro/Psych PSYCHIATRIC DISORDERS Anxiety Depression Bipolar Disorder negative neurological ROS     GI/Hepatic Neg liver ROS, GERD  Controlled and Medicated,  Endo/Other  diabetes, Oral Hypoglycemic AgentsObese BMI 37  Renal/GU negative Renal ROS  negative genitourinary   Musculoskeletal  (+) Arthritis , Osteoarthritis,    Abdominal (+) + obese,   Peds  Hematology negative hematology ROS (+)   Anesthesia Other Findings   Reproductive/Obstetrics                             Anesthesia Physical  Anesthesia Plan  ASA: 3  Anesthesia Plan: MAC   Post-op Pain Management: Minimal or no pain anticipated   Induction: Intravenous  PONV Risk Score and Plan: 1 and Propofol infusion and Treatment may vary due to age or medical condition  Airway Management Planned: Natural  Airway  Additional Equipment:   Intra-op Plan:   Post-operative Plan:   Informed Consent: I have reviewed the patients History and Physical, chart, labs and discussed the procedure including the risks, benefits and alternatives for the proposed anesthesia with the patient or authorized representative who has indicated his/her understanding and acceptance.     Dental advisory given  Plan Discussed with: CRNA  Anesthesia Plan Comments:         Anesthesia Quick Evaluation

## 2022-07-14 NOTE — Transfer of Care (Signed)
Immediate Anesthesia Transfer of Care Note  Patient: Sean Hill  Procedure(s) Performed: ESOPHAGOGASTRODUODENOSCOPY (EGD) WITH PROPOFOL SAVORY DILATION BIOPSY  Patient Location: PACU  Anesthesia Type:MAC  Level of Consciousness: awake and patient cooperative  Airway & Oxygen Therapy: Patient Spontanous Breathing and Patient connected to face mask  Post-op Assessment: Report given to RN and Post -op Vital signs reviewed and stable  Post vital signs: Reviewed and stable  Last Vitals:  Vitals Value Taken Time  BP    Temp    Pulse    Resp    SpO2      Last Pain:  Vitals:   07/14/22 0648  TempSrc: Temporal  PainSc: 0-No pain         Complications: No notable events documented.

## 2022-07-14 NOTE — Interval H&P Note (Signed)
History and Physical Interval Note:  07/14/2022 7:30 AM  Sean Hill  has presented today for surgery, with the diagnosis of gerd, dysphagia.  The various methods of treatment have been discussed with the patient and family. After consideration of risks, benefits and other options for treatment, the patient has consented to  Procedure(s): ESOPHAGOGASTRODUODENOSCOPY (EGD) WITH PROPOFOL (N/A) as a surgical intervention.  The patient's history has been reviewed, patient examined, no change in status, stable for surgery.  I have reviewed the patient's chart and labs.  Questions were answered to the patient's satisfaction.     Dominic Pea Sean Hill

## 2022-07-14 NOTE — Discharge Instructions (Signed)
YOU HAD AN ENDOSCOPIC PROCEDURE TODAY: Refer to the procedure report and other information in the discharge instructions given to you for any specific questions about what was found during the examination. If this information does not answer your questions, please call [place] office at [phone] to clarify.  ° °YOU SHOULD EXPECT: Some feelings of bloating in the abdomen. Passage of more gas than usual. Walking can help get rid of the air that was put into your GI tract during the procedure and reduce the bloating.. ° °DIET: Your first meal following the procedure should be a light meal and then it is ok to progress to your normal diet. A half-sandwich or bowl of soup is an example of a good first meal. Heavy or fried foods are harder to digest and may make you feel nauseous or bloated. Drink plenty of fluids but you should avoid alcoholic beverages for 24 hours. If you had a esophageal dilation, please see attached instructions for diet.   ° °ACTIVITY: Your care partner should take you home directly after the procedure. You should plan to take it easy, moving slowly for the rest of the day. You can resume normal activity the day after the procedure however YOU SHOULD NOT DRIVE, use power tools, machinery or perform tasks that involve climbing or major physical exertion for 24 hours (because of the sedation medicines used during the test).  ° °SYMPTOMS TO REPORT IMMEDIATELY: °A gastroenterologist can be reached at any hour. Please call [phone]  for any of the following symptoms:  ° °Following upper endoscopy (EGD, EUS, ERCP, esophageal dilation) °Vomiting of blood or coffee ground material  °New, significant abdominal pain  °New, significant chest pain or pain under the shoulder blades  °Painful or persistently difficult swallowing  °New shortness of breath  °Black, tarry-looking or red, bloody stools ° °FOLLOW UP:  °If any biopsies were taken you will be contacted by phone or by letter within the next 1-3  weeks. Call [phone]  if you have not heard about the biopsies in 3 weeks.  °Please also call with any specific questions about appointments or follow up tests. ° °

## 2022-07-14 NOTE — Anesthesia Postprocedure Evaluation (Signed)
Anesthesia Post Note  Patient: Sean Hill  Procedure(s) Performed: ESOPHAGOGASTRODUODENOSCOPY (EGD) WITH PROPOFOL SAVORY DILATION BIOPSY     Patient location during evaluation: PACU Anesthesia Type: MAC Level of consciousness: awake and alert Pain management: pain level controlled Vital Signs Assessment: post-procedure vital signs reviewed and stable Respiratory status: spontaneous breathing, nonlabored ventilation and respiratory function stable Cardiovascular status: blood pressure returned to baseline and stable Postop Assessment: no apparent nausea or vomiting Anesthetic complications: no   No notable events documented.  Last Vitals:  Vitals:   07/14/22 0814 07/14/22 0824  BP: (!) 119/46 (!) 129/49  Pulse: 65 63  Resp: 14 19  Temp:    SpO2: 97% 94%    Last Pain:  Vitals:   07/14/22 0824  TempSrc:   PainSc: 0-No pain                 Lynda Rainwater

## 2022-07-15 DIAGNOSIS — H5789 Other specified disorders of eye and adnexa: Secondary | ICD-10-CM | POA: Diagnosis not present

## 2022-07-15 LAB — SURGICAL PATHOLOGY

## 2022-07-17 ENCOUNTER — Encounter (HOSPITAL_COMMUNITY): Payer: Self-pay | Admitting: Gastroenterology

## 2022-07-20 ENCOUNTER — Encounter (HOSPITAL_COMMUNITY): Payer: Self-pay | Admitting: *Deleted

## 2022-07-20 ENCOUNTER — Ambulatory Visit (HOSPITAL_BASED_OUTPATIENT_CLINIC_OR_DEPARTMENT_OTHER): Payer: PPO | Admitting: *Deleted

## 2022-07-20 VITALS — BP 144/73 | HR 73 | Resp 20 | Ht 68.0 in | Wt 270.0 lb

## 2022-07-20 DIAGNOSIS — F319 Bipolar disorder, unspecified: Secondary | ICD-10-CM | POA: Diagnosis not present

## 2022-07-20 NOTE — Patient Instructions (Signed)
Pt presents today for due Abilify Maintena 400 mg injection. Pt is appropriate, pleasant and cooperative on approach. Pt states that he is overall doing well despite numerous medical issues. Mood has been stable he says. No AVH or SI. Injection prepared as ordered and given RUOQ without complaint. Pt to return in approximately 28 days for next due injection. Pt encouraged to call with any questions or concerns.

## 2022-07-27 ENCOUNTER — Other Ambulatory Visit (HOSPITAL_COMMUNITY): Payer: Self-pay | Admitting: Psychiatry

## 2022-07-27 DIAGNOSIS — F319 Bipolar disorder, unspecified: Secondary | ICD-10-CM

## 2022-07-27 DIAGNOSIS — F411 Generalized anxiety disorder: Secondary | ICD-10-CM

## 2022-07-28 ENCOUNTER — Encounter (HOSPITAL_COMMUNITY): Payer: Self-pay | Admitting: Psychiatry

## 2022-07-28 ENCOUNTER — Telehealth (HOSPITAL_BASED_OUTPATIENT_CLINIC_OR_DEPARTMENT_OTHER): Payer: PPO | Admitting: Psychiatry

## 2022-07-28 ENCOUNTER — Telehealth: Payer: Self-pay | Admitting: Pharmacist

## 2022-07-28 DIAGNOSIS — F411 Generalized anxiety disorder: Secondary | ICD-10-CM | POA: Diagnosis not present

## 2022-07-28 DIAGNOSIS — F319 Bipolar disorder, unspecified: Secondary | ICD-10-CM | POA: Diagnosis not present

## 2022-07-28 MED ORDER — ARIPIPRAZOLE ER 400 MG IM PRSY: 400.0000 mg | PREFILLED_SYRINGE | INTRAMUSCULAR | 2 refills | Status: DC

## 2022-07-28 MED ORDER — BENZTROPINE MESYLATE 0.5 MG PO TABS: 0.5000 mg | ORAL_TABLET | Freq: Every day | ORAL | 0 refills | Status: DC

## 2022-07-28 MED ORDER — TEMAZEPAM 15 MG PO CAPS: 15.0000 mg | ORAL_CAPSULE | Freq: Every day | ORAL | 0 refills | Status: DC

## 2022-07-28 MED ORDER — CITALOPRAM HYDROBROMIDE 20 MG PO TABS: 20.0000 mg | ORAL_TABLET | Freq: Every day | ORAL | 0 refills | Status: DC

## 2022-07-28 NOTE — Progress Notes (Signed)
Virtual Visit via Telephone Note  I connected with Sean Hill on 07/28/22 at  3:20 PM EST by telephone and verified that I am speaking with the correct person using two identifiers.  Location: Patient: Home Provider: Office   I discussed the limitations, risks, security and privacy concerns of performing an evaluation and management service by telephone and the availability of in person appointments. I also discussed with the patient that there may be a patient responsible charge related to this service. The patient expressed understanding and agreed to proceed.   History of Present Illness: Patient is evaluated by phone session.  He is doing well on his medication.  On the last visit we cut down his temazepam and he is only taking 15 mg.  He is not as sleepy or groggy.  Patient reported there are times he wakes up during his sleep but able to go back without problem.  He denies any mania, psychosis, hallucination.  Now he like to come off from Cogentin because of dry mouth.  In the past he had tried but his tremors got worse.  He denies any hallucination or any paranoia.  Recently had a blood work and his hemoglobin A1c is 6.2.  He trying to lose weight and his PCP started low-dose metformin.  Patient is having upcoming eyelid surgery.  He is anxious about it but also prepare.  Lately he is having chest pain and he had a cardiac workup and endoscopy.  He was told he may have gastritis and is taking medication for that.  Overall he feels things are going very well.  He denies any suicidal thoughts or homicidal thoughts.  He stopped driving because he does not feel comfortable.  He is compliant with Celexa, Abilify, Cogentin and temazepam.  Past Psychiatric History:  H/O multiple hospitalization.  Last inpatient in July 2014. H/O overdose on Latuda with alcohol. H/O cutting wrist.  Took Tegretol, Depakote, Ambien, Remeron, Vistaril, Geodon, Abilify, lithium, Provigil, Zoloft, Neurontin,  Lamictal, Wellbutrin, Risperdal and Pristiq. H/O mania, aggression, getting speeding tickets, excessive buying and impulsive behavior.   Psychiatric Specialty Exam: Physical Exam  Review of Systems  Constitutional:        Dry mouth    Weight 270 lb (122.5 kg).There is no height or weight on file to calculate BMI.  General Appearance: NA  Eye Contact:  NA  Speech:  Slow  Volume:  Decreased  Mood:  Euthymic  Affect:  NA  Thought Process:  Goal Directed  Orientation:  Full (Time, Place, and Person)  Thought Content:  Logical  Suicidal Thoughts:  No  Homicidal Thoughts:  No  Memory:  Immediate;   Fair Recent;   Fair Remote;   Fair  Judgement:  Good  Insight:  Present  Psychomotor Activity:  NA  Concentration:  Concentration: Fair and Attention Span: Fair  Recall:  [employer] of Knowledge:  Fair  Language:  Good  Akathisia:  No  Handed:  Right  AIMS (if indicated):     Assets:  Communication Skills Desire for Improvement Housing Social Support  ADL's:  Intact  Cognition:  WNL  Sleep:   frequent awakening but go back to sleep.      Assessment and Plan: Bipolar disorder type I.  Generalized anxiety disorder.  Patient like to cut down his Cogentin.  I recommend in the past he had tried but the tremors got worse.  I recommend he can try again but if he started to have tremors then  he can start taking it.  Patient agreed with the plan.  Reassurance given about upcoming his eyelid surgery.  Continue temazepam 15 mg at bedtime, Celexa 20 mg daily and he will continue Abilify injection 400 mg intramuscular every 28 days.  He like his temazepam sent to the [facility] and all other medication he [facility].  Recommended to call us back if is any question or any concern.  Follow-up in 3 months.     Follow Up Instructions:    I discussed the assessment and treatment plan with the patient. The patient was provided an opportunity to ask questions and all were  answered. The patient agreed with the plan and demonstrated an understanding of the instructions.   The patient was advised to call back or seek an in-person evaluation if the symptoms worsen or if the condition fails to improve as anticipated.  Collaboration of Care: Other provider involved in patient's care AEB notes are available in epic to review.  Patient/Guardian was advised Release of Information must be obtained prior to any record release in order to collaborate their care with an outside provider. Patient/Guardian was advised if they have not already done so to contact the registration department to sign all necessary forms in order for [place] to release information regarding their care.   Consent: Patient/Guardian gives verbal consent for treatment and assignment of benefits for services provided during this visit. Patient/Guardian expressed understanding and agreed to proceed.    I provided 16 minutes of non-face-to-face time during this encounter.   [name], MD

## 2022-07-28 NOTE — Chronic Care Management (AMB) (Cosign Needed)
Chronic Care Management Pharmacy Assistant   Name: Sean Hill  MRN: [id number] DOB: 02/12/1941  Reason for Encounter: Medication Review/ Medication Coordination   Recent office visits:  05/05/22 [name], NP - Patient presented for Routine general medical examination at a health care facility and other concerns. No medication changes.   Recent consult visits:  07/20/22 Alison Murray LPN [facility]) - Clinical support encounter for Bipolar disorder. No other visit details available.  06/08/22 Minus Breeding, MD - Patient presented to [facility] for Respiratory Therapy. No medication changes.   05/26/22 Minus Breeding, MD (Cardiology) - Patient presented for Precordial chest pain and other concerns. Prescribed Metoprolol (for procedure)  04/28/22 Arfeen, Arlyce Harman MD [facility]) - Patient presented via video for Bipolar disorder. No other visit details available.  Hospital visits:  Medication Reconciliation was completed by comparing discharge summary, patient's EMR and Pharmacy list, and upon discussion with patient.  Patient presented to [facility] on 07/14/22 due to Upper Endoscopy Procedure. Patient was present for 3 hours.  New?Medications Started at [facility] Discharge:?? -started  none  Medication Changes at Hospital Discharge: -Changed  none  Medications Discontinued at Hospital Discharge: -Stopped  none  Medications that remain the same after Hospital Discharge:??  -All other medications will remain the same.    Medications: Outpatient Encounter Medications as of 07/28/2022  Medication Sig   albuterol (VENTOLIN HFA) 108 (90 Base) MCG/ACT inhaler Inhale 2 puffs into the lungs every 6 (six) hours as needed for wheezing or shortness of breath.   ARIPiprazole ER (ABILIFY MAINTENA) 400 MG PRSY prefilled syringe Inject 400 mg into the muscle every 28 (twenty-eight) days.   azelastine (ASTELIN) 0.1 % nasal spray Place 2  sprays into both nostrils 2 (two) times daily. Use in each nostril as directed   benztropine (COGENTIN) 0.5 MG tablet Take 1 tablet (0.5 mg total) by mouth daily.   cetirizine (ZYRTEC) 10 MG tablet Take 10 mg by mouth every morning.   citalopram (CELEXA) 20 MG tablet Take 1 tablet (20 mg total) by mouth daily.   ezetimibe (ZETIA) 10 MG tablet TAKE ONE TABLET BY MOUTH AT NOON   fenofibrate (TRICOR) 145 MG tablet TAKE ONE TABLET BY MOUTH ONCE DAILY   icosapent Ethyl (VASCEPA) 1 g capsule Take 2 g by mouth 2 (two) times daily.   ipratropium-albuterol (DUONEB) 0.5-2.5 (3) MG/3ML SOLN Take 3 mLs by nebulization every 6 (six) hours as needed. (Patient taking differently: Take 3 mLs by nebulization every 6 (six) hours as needed (shortness of breath).)   losartan (COZAAR) 50 MG tablet TAKE ONE TABLET BY MOUTH EVERY MORNING   metFORMIN (GLUCOPHAGE) 500 MG tablet Take 1 tablet (500 mg total) by mouth daily.   metoprolol tartrate (LOPRESSOR) 100 MG tablet Take 1 tablet by mouth once for procedure.   Multiple Vitamin (MULTIVITAMIN WITH MINERALS) TABS tablet Take 1 tablet by mouth daily. 50 +   Multiple Vitamins-Minerals (PRESERVISION AREDS 2 PO) Take 1 capsule by mouth 2 (two) times daily.   naproxen sodium (ALEVE) 220 MG tablet Take 220 mg by mouth daily as needed (pain).   pantoprazole (PROTONIX) 40 MG tablet TAKE ONE TABLET BY MOUTH TWICE DAILY   tamsulosin (FLOMAX) 0.4 MG CAPS capsule Take 2 capsules (0.8 mg total) by mouth daily.   temazepam (RESTORIL) 15 MG capsule Take 1 capsule (15 mg total) by mouth at bedtime.   Vitamin D, Cholecalciferol, 1000 UNITS TABS Take 1,000 Units by mouth daily.  Facility-Administered Encounter Medications as of 07/28/2022  Medication   ARIPiprazole ER (ABILIFY MAINTENA) 400 MG prefilled syringe 400 mg   Reviewed chart for medication changes ahead of medication coordination call.   BP Readings from Last 3 Encounters:  07/14/22 (!) 129/49  05/26/22 125/78   05/05/22 120/60    Lab Results  Component Value Date   HGBA1C 6.2 05/05/2022     Patient obtains medications through Adherence Packaging  90 Days   Last adherence delivery included:  Cetirizine 10 mg: one tablet at breakfast Mens Multivitamin : Take one at breakfast Preservision Areds-2gt: one at breakfast and one at bedtime   Vitamin D3 1000 u: one tablet at breakfast Fenofibrate 145 mg: one tablet at breakfast Ezetimibe (ZETIA) 10 mg: one tablet at lunch Losartan (COZAAR) 50 MG tablet: one tablet at breakfast Pantoprazole (Protonix) 40 mg : one tab at breakfast and one at dinner Benztropine (COGENTIN) 0.5 mg: one tablet at lunch Citalopram (CELEXA) 20 mg: one tablet at breakfast Vascepa 1 gm: Take 2g at breakfast and 2g at Dinner Tamsulosin 0.4 mg: take one capsule at breakfast     Coordinated  fill to be also delivered Azelastine Spray   Confirmed delivery date of 05/10/22, advised patient that pharmacy will contact them the morning of delivery.   Patient is due for next adherence delivery on: 08/09/22. Called patient and reviewed medications and coordinated delivery. Packs 90 DS  This delivery to include:  Cetirizine 10 mg: one tablet at breakfast Mens Multivitamin : Take one at breakfast Preservision Areds-2gt: one at breakfast and one at bedtime   Vitamin D3 1000 u: one tablet at breakfast Tamsulosin 0.4 mg: take two capsules at breakfast  Fenofibrate 145 mg: one tablet at breakfast Ezetimibe (ZETIA) 10 mg: one tablet at lunch Losartan (COZAAR) 50 MG tablet: one tablet at breakfast Pantoprazole (Protonix) 40 mg : one tab at breakfast and one at dinner Benztropine (COGENTIN) 0.5 mg: one tablet at lunch Citalopram (CELEXA) 20 mg: one tablet at breakfast Vascepa 1 gm: Take 2g at breakfast and 2g at Dinner Azelastine Spray Metformin 500 mg: one tablet at breakfast   Confirmed delivery date of 08/09/22, advised patient that pharmacy will contact them the morning  of delivery.   Care Gaps: AWV- 09/23/21 Ccm- 11/23 BP- 129/49 07/14/22  Star Rating Drugs: Losartan (Cozaar) 50 mg - Last filled 05/05/22 90 DS at [facility] Pharmacist Assistant [phone]

## 2022-07-29 ENCOUNTER — Telehealth: Payer: Self-pay | Admitting: *Deleted

## 2022-07-29 ENCOUNTER — Encounter: Payer: Self-pay | Admitting: *Deleted

## 2022-07-29 NOTE — Patient Instructions (Signed)
Visit Information  Thank you for taking time to visit with me today. Please don't hesitate to contact me if I can be of assistance to you.   Following are the goals we discussed today:   Goals Addressed               This Visit's Progress     COMPLETED: No needs (pt-stated)        Care Coordination Interventions: Provided education to patient and/or caregiver about advanced directives Reviewed medications with patient and discussed adherence to all prescribed medications Reviewed scheduled/upcoming provider appointments including pending appointments with sufficient transportation. Screening for signs and symptoms of depression related to chronic disease state  Assessed social determinant of health barriers         Please call the care guide team at [phone] if you need to cancel or reschedule your appointment.   If you are experiencing a Mental Health or [place] or need someone to talk to, please call the Suicide and Crisis Lifeline: 988  Patient verbalizes understanding of instructions and care plan provided today and agrees to view in [place]. Active MyChart status and patient understanding of how to access instructions and care plan via MyChart confirmed with patient.     No further follow up required: No needs  Raina Mina, RN Care Management Coordinator [place] Office [phone]

## 2022-07-29 NOTE — Patient Outreach (Signed)
  Care Coordination   Initial Visit Note   07/29/2022 Name: Sean Hill MRN: [id number] DOB: 07-Jul-1941  Sean Hill is a 81 y.o. year old male who sees Nafziger, Tommi Rumps, NP for primary care. I spoke with  Sean Hill by phone today.  What matters to the patients health and wellness today?  No needs    Goals Addressed               This Visit's Progress     COMPLETED: No needs (pt-stated)        Care Coordination Interventions: Provided education to patient and/or caregiver about advanced directives Reviewed medications with patient and discussed adherence to all prescribed medications Reviewed scheduled/upcoming provider appointments including pending appointments with sufficient transportation. Screening for signs and symptoms of depression related to chronic disease state  Assessed social determinant of health barriers         SDOH assessments and interventions completed:  Yes  SDOH Interventions Today    Flowsheet Row Most Recent Value  SDOH Interventions   Food Insecurity Interventions Intervention Not Indicated  Housing Interventions Intervention Not Indicated  Transportation Interventions Intervention Not Indicated  Utilities Interventions Intervention Not Indicated        Care Coordination Interventions Activated:  Yes  Care Coordination Interventions:  Yes, provided   Follow up plan: No further intervention required.   Encounter Outcome:  Pt. Visit Completed   Sean Mina, RN Care Management Coordinator [place] Office [phone]

## 2022-08-02 ENCOUNTER — Other Ambulatory Visit: Payer: Self-pay | Admitting: Adult Health

## 2022-08-02 ENCOUNTER — Other Ambulatory Visit (HOSPITAL_COMMUNITY): Payer: Self-pay | Admitting: Psychiatry

## 2022-08-02 DIAGNOSIS — F319 Bipolar disorder, unspecified: Secondary | ICD-10-CM

## 2022-08-02 DIAGNOSIS — F411 Generalized anxiety disorder: Secondary | ICD-10-CM

## 2022-08-03 DIAGNOSIS — H02834 Dermatochalasis of left upper eyelid: Secondary | ICD-10-CM | POA: Diagnosis not present

## 2022-08-03 DIAGNOSIS — H02831 Dermatochalasis of right upper eyelid: Secondary | ICD-10-CM | POA: Diagnosis not present

## 2022-08-04 ENCOUNTER — Telehealth: Payer: Self-pay | Admitting: Pharmacist

## 2022-08-04 ENCOUNTER — Other Ambulatory Visit (HOSPITAL_COMMUNITY): Payer: Self-pay | Admitting: Psychiatry

## 2022-08-04 DIAGNOSIS — F319 Bipolar disorder, unspecified: Secondary | ICD-10-CM

## 2022-08-04 DIAGNOSIS — F411 Generalized anxiety disorder: Secondary | ICD-10-CM

## 2022-08-04 NOTE — Progress Notes (Signed)
    Chronic Care Management Pharmacy Assistant   Name: Sean Hill  MRN: [id number] DOB: [birth year]   08/04/22 APPOINTMENT REMINDER   Patient was reminded to have all medications, supplements and any blood glucose and blood pressure readings available for review with Jeni Salles, Pharm. D, for telephone visit on 08/05/22 at 11:45.    Care Gaps: AWV- 09/23/21 BP- 129/49 07/14/22   Star Rating Drug: Losartan (Cozaar) 50 mg - Last filled 08/09/22 90 DS at [facility]     Medications: Outpatient Encounter Medications as of 08/04/2022  Medication Sig   albuterol (VENTOLIN HFA) 108 (90 Base) MCG/ACT inhaler Inhale 2 puffs into the lungs every 6 (six) hours as needed for wheezing or shortness of breath.   ARIPiprazole ER (ABILIFY MAINTENA) 400 MG PRSY prefilled syringe Inject 400 mg into the muscle every 28 (twenty-eight) days.   azelastine (ASTELIN) 0.1 % nasal spray Place 2 sprays into both nostrils 2 (two) times daily. Use in each nostril as directed   benztropine (COGENTIN) 0.5 MG tablet Take 1 tablet (0.5 mg total) by mouth daily.   cetirizine (ZYRTEC) 10 MG tablet Take 10 mg by mouth every morning.   citalopram (CELEXA) 20 MG tablet Take 1 tablet (20 mg total) by mouth daily.   ezetimibe (ZETIA) 10 MG tablet TAKE ONE TABLET BY MOUTH AT NOON   fenofibrate (TRICOR) 145 MG tablet TAKE ONE TABLET BY MOUTH ONCE DAILY   icosapent Ethyl (VASCEPA) 1 g capsule Take 2 g by mouth 2 (two) times daily.   ipratropium-albuterol (DUONEB) 0.5-2.5 (3) MG/3ML SOLN Take 3 mLs by nebulization every 6 (six) hours as needed. (Patient taking differently: Take 3 mLs by nebulization every 6 (six) hours as needed (shortness of breath).)   losartan (COZAAR) 50 MG tablet TAKE ONE TABLET BY MOUTH EVERY MORNING   metFORMIN (GLUCOPHAGE) 500 MG tablet TAKE ONE TABLET BY MOUTH ONCE DAILY   metoprolol tartrate (LOPRESSOR) 100 MG tablet Take 1 tablet by mouth once for procedure.   Multiple Vitamin  (MULTIVITAMIN WITH MINERALS) TABS tablet Take 1 tablet by mouth daily. 50 +   Multiple Vitamins-Minerals (PRESERVISION AREDS 2 PO) Take 1 capsule by mouth 2 (two) times daily.   naproxen sodium (ALEVE) 220 MG tablet Take 220 mg by mouth daily as needed (pain).   pantoprazole (PROTONIX) 40 MG tablet TAKE ONE TABLET BY MOUTH TWICE DAILY   tamsulosin (FLOMAX) 0.4 MG CAPS capsule Take 2 capsules (0.8 mg total) by mouth daily.   temazepam (RESTORIL) 15 MG capsule Take 1 capsule (15 mg total) by mouth at bedtime.   Vitamin D, Cholecalciferol, 1000 UNITS TABS Take 1,000 Units by mouth daily.    Facility-Administered Encounter Medications as of 08/04/2022  Medication   ARIPiprazole ER (ABILIFY MAINTENA) 400 MG prefilled syringe 400 mg     [place] Clinical Pharmacist Assistant [phone]
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
## 2022-08-05 ENCOUNTER — Ambulatory Visit (INDEPENDENT_AMBULATORY_CARE_PROVIDER_SITE_OTHER): Payer: PPO | Admitting: Pharmacist

## 2022-08-05 ENCOUNTER — Other Ambulatory Visit: Payer: Self-pay | Admitting: Adult Health

## 2022-08-05 DIAGNOSIS — I10 Essential (primary) hypertension: Secondary | ICD-10-CM

## 2022-08-05 DIAGNOSIS — R7303 Prediabetes: Secondary | ICD-10-CM

## 2022-08-05 DIAGNOSIS — R351 Nocturia: Secondary | ICD-10-CM

## 2022-08-05 DIAGNOSIS — E7849 Other hyperlipidemia: Secondary | ICD-10-CM

## 2022-08-05 MED ORDER — FREESTYLE LIBRE 2 SENSOR MISC: 1.0000 | 11 refills | Status: DC

## 2022-08-05 MED ORDER — PRAVASTATIN SODIUM 10 MG PO TABS: 10.0000 mg | ORAL_TABLET | ORAL | 0 refills | Status: DC

## 2022-08-05 MED ORDER — MIRABEGRON ER 25 MG PO TB24: 25.0000 mg | ORAL_TABLET | Freq: Every day | ORAL | 0 refills | Status: DC

## 2022-08-05 MED ORDER — FREESTYLE LIBRE 2 READER DEVI: 0 refills | Status: DC

## 2022-08-05 NOTE — Patient Instructions (Signed)
Hi Sean Hill,  It was great catch up again! Don't forget to try to get back to checking your blood pressure at home as it's been up in the office. Also don't forget to bring it in with you when you have your office visit with [facility].  Please reach out to me if you have any questions or need anything before I reach back out!  Best, Maddie  Jeni Salles, PharmD, [facility]  Visit Information   Goals Addressed   None    Patient Care Plan: CCM Pharmacy Care Plan     Problem Identified: Problem: Hypertension, Hyperlipidemia, Diabetes, GERD, Allergic Rhinitis and insomnia, bipolar disorder      Long-Range Goal: Patient-Specific Goal   Start Date: 11/11/2020  Expected End Date: 11/11/2021  Recent Progress: On track  Priority: High  Note:   Current Barriers:  Unable to independently monitor therapeutic efficacy Unable to achieve control of triglycerides and blood pressure  Does not contact provider office for questions/concerns  Pharmacist Clinical Goal(s):  Patient will achieve adherence to monitoring guidelines and medication adherence to achieve therapeutic efficacy achieve control of blood pressure as evidenced by home and office readings  through collaboration with PharmD and provider.    Interventions: 1:1 collaboration with [name], NP regarding development and update of comprehensive plan of care as evidenced by provider attestation and co-signature Inter-disciplinary care team collaboration (see longitudinal plan of care) Comprehensive medication review performed; medication list updated in electronic medical record  Hypertension (BP goal <140/90) -Uncontrolled -Current treatment: Losartan 50 mg 1 tablet daily - Appropriate, Query effective, Safe, Accessible -Medications previously tried: lisinopril (cough) -Current home readings: unable to provide recent readings; checking once a month with injections;  arm cuff -Current dietary habits:  tries to avoid using salt with foods; drinks 1-2 cups of coffee in the morning -Current exercise habits:  patient does not do any structured exercise due to shoulder pain and double knee replacements; patient can walk and would like to start being more active -Denies hypotensive/hypertensive symptoms -Educated on BP goals and benefits of medications for prevention of heart attack, stroke and kidney damage; Exercise goal of 150 minutes per week; Importance of home blood pressure monitoring; -Counseled to monitor BP at home weekly, document, and provide log at future appointments -Counseled on diet and exercise extensively Recommended to continue current medication Recommended setting a goal for exercise he is able to accomplish such as 15 minutes 2-3 days a week Recommended bringing home BP monitor to next office visit.   Hyperlipidemia: (LDL goal < 100) -Not ideally controlled; uncontrolled triglycerides -Current treatment: Ezetimibe 10 mg 1 tablet daily - not taking Vascepa 1 g 2 capsules twice daily - Appropriate, Query effective, Safe, Accessible Fenofibrate 160 mg 1 tablet daily - Appropriate, Query effective, Safe, Accessible -Medications previously tried: statins  -Current dietary patterns: tries to avoid fatty foods; eating more sweets lately and does not drink alcohol -Current exercise habits: patient does not do any structured exercise due to shoulder pain and double knee replacements; patient can walk and would like to start being more active -Educated on Cholesterol goals;  Importance of limiting foods high in cholesterol; Exercise goal of 150 minutes per week; -Recommended to continue current medication  Pre-diabetes (A1c goal <6.5%) -Controlled -Current medications: Metformin 500 mg 1 tablet daily - Query Appropriate, Query effective, Safe, Accessible -Medications previously tried: none  -Current home glucose readings fasting glucose:  none post prandial glucose: none -Denies hypoglycemic/hyperglycemic symptoms -  Current meal patterns:  breakfast: did not discuss  lunch: did not discuss  dinner: did not discuss snacks: did not discuss drinks: did not discuss -Current exercise: patient does not do any structured exercise due to shoulder pain and double knee replacements; patient can walk and would like to start being more active -Educated on Carbohydrate counting and/or plate method -Counseled to check feet daily and get yearly eye exams -Counseled on diet and exercise extensively Recommended CGM monitoring for dietary adjustments.  GERD (Goal: minimize symptoms of heartburn/acid reflux) -Controlled -Current treatment  Pantoprazole '40mg'$ , 1 tablet twice daily - Appropriate, Effective, Safe, Accessible -Medications previously tried: none  -Recommended to continue current medication  Bipolar disorder (Goal: minimize symptoms) -Controlled -Current treatment  Aripiprazole ER '400mg'$ , inject '400mg'$  every twenty-eight days  - Appropriate, Effective, Safe, Accessible Citalopram '20mg'$ , 1 tablet once daily - Appropriate, Effective, Safe, Accessible -Medications previously tried: unknown  -Recommended to continue current medication  Sinus congestion (Goal: minimize symptoms of congestion) -Uncontrolled -Current treatment  Fluticasone nasal spray, 1 spray into both nostrils in the morning and at bedtime  - Appropriate, Effective, Safe, Accessible Zyrtec 10 mg daily - Appropriate, Effective, Safe, Accessible -Medications previously tried: chlopheniramine (switched to safer alternative Zyrtec) -Recommended to continue current medication  Insomnia (Goal: improve quality and quantity of sleep) -Controlled -Current treatment  Temazepam 15 mg, 1 capsule at bedtime - Appropriate, Effective, Query Safe, Accessible -Medications previously tried: several medications, trazodone (ineffective) -Recommended to continue current  medication Counseled on practicing good sleep hygiene by setting a sleep schedule and maintaining it, avoid excessive napping, following a nightly routine, avoiding screen time for 30-60 minutes before going to bed, and making the bedroom a cool, quiet and dark space  Recommended trial of melatonin timed release to help with staying asleep  BPH (Goal: minimize symptoms) -Uncontrolled -Current treatment  Tamsulosin 0.4 mg 2 capsules daily - Appropriate, Query effective, Safe, Accessible -Medications previously tried: none  -Collaborated with PCP to consider switching medications.   Health Maintenance -Vaccine gaps: none -Current therapy:  Multivitamin, 1 tablet once daily Preservision AREDS 2, 1 capsule twice daily Vitamin D3, 1,000 units once daily  -Educated on Cost vs benefit of each product must be carefully weighed by individual consumer -Patient is satisfied with current therapy and denies issues -Recommended to continue current medication   Patient Goals/Self-Care Activities Patient will:  - take medications as prescribed check blood pressure weekly, document, and provide at future appointments target a minimum of 150 minutes of moderate intensity exercise weekly  Follow Up Plan: Telephone follow up appointment with care management team member scheduled for: 4 months       Patient verbalizes understanding of instructions and care plan provided today and agrees to view in [place]. Active MyChart status and patient understanding of how to access instructions and care plan via MyChart confirmed with patient.    The pharmacy team will reach out to the patient again over the next 7 days.   Viona Gilmore, [facility]

## 2022-08-08 ENCOUNTER — Telehealth: Payer: Self-pay | Admitting: Pharmacist

## 2022-08-08 ENCOUNTER — Telehealth (HOSPITAL_COMMUNITY): Payer: Self-pay

## 2022-08-08 ENCOUNTER — Other Ambulatory Visit (HOSPITAL_COMMUNITY): Payer: Self-pay | Admitting: Psychiatry

## 2022-08-08 DIAGNOSIS — T8130XA Disruption of wound, unspecified, initial encounter: Secondary | ICD-10-CM | POA: Diagnosis not present

## 2022-08-08 DIAGNOSIS — F411 Generalized anxiety disorder: Secondary | ICD-10-CM

## 2022-08-08 DIAGNOSIS — F319 Bipolar disorder, unspecified: Secondary | ICD-10-CM

## 2022-08-08 NOTE — Telephone Encounter (Signed)
Medication management - Telephone call with Chasity, pharmacist at [facility], after they left a message requesting new 90 day orders for patient's prescribed Citalopram and Benztropine. Collateral agreed to contact the [facility] [employer] on [address] to have the orders e-scribed there from 07/28/22 sent to them as they do the packaging of patients medications now.  Collateral requested all future orders be sent to them for any of patient's refills and agreed to inform Dr. Adele Schilder of this plan. Collateral to call back if any issues transferring orders to them.

## 2022-08-08 NOTE — Telephone Encounter (Signed)
Called patient to follow up on CCM visit on Friday after discussion with PCP.  Patient is to continue without Zetia and trial pravastatin 10 mg every other day to see if he is able to tolerate. He has not tried pravastatin previously (only rosuvastatin and atorvastatin). Patient requested this outside of his packaging for the next delivery to make sure he tolerates.  Patient is also aware of the change from tamsulosin to Myrbetriq. Pharmacy confirmed that patient is in the donut hole and the price of Myrbetriq is $300 for a 3 months supply. Patient reports he is unable to afford that. Will work on getting samples of this for him to try.   Confirmed changes with [facility] for his adherence packaging.

## 2022-08-16 ENCOUNTER — Encounter: Payer: Self-pay | Admitting: Adult Health

## 2022-08-16 ENCOUNTER — Telehealth: Payer: Self-pay | Admitting: Adult Health

## 2022-08-16 ENCOUNTER — Ambulatory Visit (INDEPENDENT_AMBULATORY_CARE_PROVIDER_SITE_OTHER): Payer: PPO | Admitting: Adult Health

## 2022-08-16 ENCOUNTER — Other Ambulatory Visit: Payer: Self-pay | Admitting: *Deleted

## 2022-08-16 VITALS — BP 138/70 | HR 73 | Temp 98.0°F | Ht 68.0 in | Wt 270.0 lb

## 2022-08-16 DIAGNOSIS — R351 Nocturia: Secondary | ICD-10-CM

## 2022-08-16 DIAGNOSIS — R7303 Prediabetes: Secondary | ICD-10-CM | POA: Diagnosis not present

## 2022-08-16 DIAGNOSIS — I10 Essential (primary) hypertension: Secondary | ICD-10-CM

## 2022-08-16 LAB — BASIC METABOLIC PANEL
BUN: 15 mg/dL (ref 6–23)
CO2: 26 mEq/L (ref 19–32)
Calcium: 9.6 mg/dL (ref 8.4–10.5)
Chloride: 104 mEq/L (ref 96–112)
Creatinine, Ser: 1.03 mg/dL (ref 0.40–1.50)
GFR: 69 mL/min (ref >60.00)
Glucose, Bld: 119 mg/dL — ABNORMAL HIGH (ref 70–99)
Potassium: 3.9 mEq/L (ref 3.5–5.1)
Sodium: 139 mEq/L (ref 135–145)

## 2022-08-16 LAB — HEMOGLOBIN A1C: Hgb A1c MFr Bld: 5.9 % (ref 4.6–6.5)

## 2022-08-16 MED ORDER — MIRABEGRON ER 25 MG PO TB24: 25.0000 mg | ORAL_TABLET | Freq: Every day | ORAL | 0 refills | Status: DC

## 2022-08-16 NOTE — Telephone Encounter (Signed)
Updated patient on his labs  

## 2022-08-16 NOTE — Progress Notes (Signed)
Subjective:    Patient ID: Sean Hill, male    DOB: 1941-01-12, 81 y.o.   MRN: [id number]  HPI 81 year old male who  has a past medical history of Anal fissure, Anxiety, Arthritis, Asthma, Bipolar disorder ([place]), Cataract, Depression, Difficult airway for intubation, GERD (gastroesophageal reflux disease), Hemorrhage of colon following colonoscopy (10/08/2019), History of kidney stones, History of MRSA infection, Hyperlipidemia, Hypertension, Impaired hearing, Left bundle branch block (LBBB) on electrocardiogram (10/29/2015), Macular degeneration (senile) of retina, and Pre-diabetes.  He presents to the office today for 3 month follow up regarding HTN and glucose intolerance/prediabetes   HTN - managed with Losartan 50 mg daily. He denies dizziness, lightheadedness, chest pain, or shortness of breath BP Readings from Last 3 Encounters:  08/16/22 138/70  07/14/22 (!) 129/49  05/26/22 125/78   Prediabetes -he is currently managed with metformin 500 mg daily.  He is using the [place] 2 sensor to manage his blood sugars.  He denies effects of metformin. He did not bring his reader with him and reports blood sugar readings in the 110-160.   Lab Results  Component Value Date   HGBA1C 6.2 05/05/2022    Review of Systems See HPI   Past Medical History:  Diagnosis Date   Anal fissure    Anxiety    Arthritis    right wrist; pt. states "everywhere"   Asthma    Bipolar disorder ([place])    Cataract    Depression    Difficult airway for intubation    GERD (gastroesophageal reflux disease)    Hemorrhage of colon following colonoscopy 10/08/2019   History of kidney stones    History of MRSA infection    left knee after arthroplasty   Hyperlipidemia    Hypertension    states under control with med., has been on med. x 1 yr.   Impaired hearing    Left bundle branch block (LBBB) on electrocardiogram 10/29/2015   Macular degeneration (senile) of retina    left   Pre-diabetes      Social History   Socioeconomic History   Marital status: Married    Spouse name: Not on file   Number of children: Not on file   Years of education: Not on file   Highest education level: Not on file  Occupational History   Not on file  Tobacco Use   Smoking status: Never   Smokeless tobacco: Never  Vaping Use   Vaping Use: Never used  Substance and Sexual Activity   Alcohol use: Not Currently    Comment: occasional.   Drug use: No   Sexual activity: Not Currently    Birth control/protection: None  Other Topics Concern   Not on file  Social History Narrative   Married, lives in [place].  Retired Dance movement psychotherapist.   Two daughters, both married,3 grandchildren    No regular exercise.  Never smoker.  No ETOH.  No drugs.   Social Determinants of Health   Financial Resource Strain: Low Risk  (08/05/2022)   Overall Financial Resource Strain (CARDIA)    Difficulty of Paying Living Expenses: Not very hard  Food Insecurity: No Food Insecurity (07/29/2022)   Hunger Vital Sign    Worried About Running Out of Food in the Last Year: Never true    Ran Out of Food in the Last Year: Never true  Transportation Needs: No Transportation Needs (07/29/2022)   PRAPARE - Hydrologist (Medical): No    Lack  of Transportation (Non-Medical): No  Physical Activity: Inactive (09/23/2021)   Exercise Vital Sign    Days of Exercise per Week: 0 days    Minutes of Exercise per Session: 0 min  Stress: No Stress Concern Present (09/23/2021)   [place]    Feeling of Stress : Not at all  Social Connections: Moderately Isolated (09/23/2021)   Social Connection and Isolation Panel [NHANES]    Frequency of Communication with Friends and Family: More than three times a week    Frequency of Social Gatherings with Friends and Family: More than three times a week    Attends Religious Services: Never    Building surveyor or Organizations: No    Attends Archivist Meetings: Never    Marital Status: Married  Human resources officer Violence: Not At Risk (09/23/2021)   Humiliation, Afraid, Rape, and Kick questionnaire    Fear of Current or Ex-Partner: No    Emotionally Abused: No    Physically Abused: No    Sexually Abused: No    Past Surgical History:  Procedure Laterality Date   ANKLE FUSION Bilateral    ANKLE SURGERY Bilateral    ligament surgery   BIOPSY  04/29/2020   Procedure: BIOPSY;  Surgeon: Lavena Bullion, DO;  Location: [facility];  Service: Gastroenterology;;   BIOPSY  07/14/2022   Procedure: BIOPSY;  Surgeon: Lavena Bullion, DO;  Location: [facility];  Service: Gastroenterology;;   CARDIAC CATHETERIZATION  x 2   1983; 11/14/2003   CARPOMETACARPEL SUSPENSION PLASTY Right 09/22/2016   Procedure: right thumb carpometacarpal  ARTHROPLASTY;  Surgeon: Leanora Cover, MD;  Location: [facility];  Service: Orthopedics;  Laterality: Right;  right thumb carpometacarpal  ARTHROPLASTY   CARPOMETACARPEL SUSPENSION PLASTY Right 11/02/2017   Procedure: RIGHT THUMB SUSPENSIONPLASTY WITH TIGHTROPE, DISTAL POLE SCAPHOID  EXCISION;  Surgeon: Leanora Cover, MD;  Location: [facility];  Service: Orthopedics;  Laterality: Right;   COLONOSCOPY WITH PROPOFOL N/A 10/02/2019   Procedure: COLONOSCOPY WITH PROPOFOL;  Surgeon: Lavena Bullion, DO;  [facility];  Service: Gastroenterology;  Laterality: N/A;   COLONOSCOPY WITH PROPOFOL N/A 10/09/2019   Procedure: COLONOSCOPY WITH PROPOFOL;  Surgeon: Gatha Mayer, MD;  [facility]: [facility];  Service: Endoscopy;  Laterality: N/A;   COLONOSCOPY WITH PROPOFOL N/A 04/29/2020   Procedure: COLONOSCOPY WITH PROPOFOL;  Surgeon: Lavena Bullion, DO;  [facility];  Service: Gastroenterology;  Laterality: N/A;   COLONOSCOPY WITH PROPOFOL N/A 11/11/2021   Procedure: COLONOSCOPY WITH PROPOFOL;   Surgeon: Lavena Bullion, DO;  [facility];  Service: Gastroenterology;  Laterality: N/A;   ELBOW SURGERY Left    ENDOSCOPIC MUCOSAL RESECTION N/A 10/02/2019   Procedure: ENDOSCOPIC MUCOSAL RESECTION;  Surgeon: Lavena Bullion, DO;  [facility];  Service: Gastroenterology;  Laterality: N/A;   ESOPHAGOGASTRODUODENOSCOPY (EGD) WITH PROPOFOL N/A 07/14/2022   Procedure: ESOPHAGOGASTRODUODENOSCOPY (EGD) WITH PROPOFOL;  Surgeon: Lavena Bullion, DO;  [facility];  Service: Gastroenterology;  Laterality: N/A;   HEMOSTASIS CLIP PLACEMENT  10/09/2019   Procedure: HEMOSTASIS CLIP PLACEMENT;  Surgeon: Gatha Mayer, MD;  [facility];  Service: Endoscopy;;   HEMOSTASIS CLIP PLACEMENT  11/11/2021   Procedure: HEMOSTASIS CLIP PLACEMENT;  Surgeon: Lavena Bullion, DO;  [facility];  Service: Gastroenterology;;   HEMOSTASIS CLIP PLACEMENT  07/14/2022   Procedure: HEMOSTASIS CLIP PLACEMENT;  Surgeon: Lavena Bullion, DO;  [facility];  Service: Gastroenterology;;   INGUINAL HERNIA REPAIR Right    KNEE SURGERY     OPEN REDUCTION INTERNAL FIXATION (ORIF) DISTAL RADIAL FRACTURE Right 10/29/2015   Procedure: OPEN REDUCTION INTERNAL FIXATION (ORIF) DISTAL RADIAL FRACTURE;  Surgeon: Leanora Cover, MD;  Location: [facility];  Service: Orthopedics;  Laterality: Right;   ORIF ELBOW FRACTURE Right    POLYPECTOMY  04/29/2020   Procedure: POLYPECTOMY;  Surgeon: Lavena Bullion, DO;  [facility];  Service: Gastroenterology;;   POLYPECTOMY  11/11/2021   Procedure: POLYPECTOMY;  Surgeon: Lavena Bullion, DO;  [facility];  Service: Gastroenterology;;   POLYPECTOMY  07/14/2022   Procedure: POLYPECTOMY;  Surgeon: Lavena Bullion, DO;  [facility];  Service: Gastroenterology;;   A[place]ie [place] DILATION N/A 07/14/2022   Procedure: SAVORY DILATION;  Surgeon: Lavena Bullion, DO;  [facility];  Service: Gastroenterology;  Laterality: N/A;   SUBMUCOSAL LIFTING INJECTION  10/02/2019   Procedure: SUBMUCOSAL LIFTING INJECTION;  Surgeon: Lavena Bullion, DO;  [facility];  Service: Gastroenterology;;   TONSILLECTOMY     TOTAL KNEE ARTHROPLASTY Bilateral    TOTAL KNEE REVISION Right 08/25/2021   Procedure: Right knee polyethylene vs total knee arthroplasty revision;  Surgeon: Gaynelle Arabian, MD;  Location: [facility];  Service: Orthopedics;  Laterality: Right;   ULNAR COLLATERAL LIGAMENT REPAIR Right 12/29/2015   Procedure: REPAIR  RIGHT LATERAL ULNAR COLLATERAL LIGAMENT TEAR  EXTENSOR ORIGIN ;  Surgeon: Leanora Cover, MD;  Location: [facility];  Service: Orthopedics;  Laterality: Right;   WRIST ARTHROSCOPY WITH DEBRIDEMENT Right 07/14/2016   Procedure: RIGHT WRIST ARTHROSCOPY WITH DEBRIDEMENT  TRIANGULAR FIBROCARTILAGE COMPLEX;  Surgeon: Leanora Cover, MD;  Location: [facility];  Service: Orthopedics;  Laterality: Right;    Family History  Problem Relation Age of Onset   Alcohol abuse Mother    Ovarian cancer Mother    Alcohol abuse Father    Pancreatic cancer Father    Hyperlipidemia Brother    Barrett's esophagus Brother    Colon cancer Maternal Grandmother    Depression Daughter    Paranoid behavior Daughter    Esophageal cancer Neg Hx    Stomach cancer Neg Hx    Rectal cancer Neg Hx    Colon polyps Neg Hx     Allergies  Allergen Reactions   Penicillins Other (See Comments)    BLISTERS Did it involve swelling of the face/tongue/throat, SOB, or low BP? No Did it involve sudden or severe rash/hives, skin peeling, or any reaction on the inside of your mouth or nose? No Did you need to seek medical attention at a hospital or doctor's office? No When did it last happen? around 1980    If all above answers are "NO", may proceed with cephalosporin use. Tolerated Cephalosporin Date: 08/26/21.     Lisinopril Diarrhea and Cough     Dry cough, abdominal bloating and diarrhea   Zetia [Ezetimibe]     Myalgia    Codeine Rash    Current Outpatient Medications on File Prior to Visit  Medication Sig Dispense Refill   albuterol (VENTOLIN HFA) 108 (90 Base) MCG/ACT inhaler Inhale 2 puffs into the lungs every 6 (six) hours as needed for wheezing or shortness of breath. 6.7 g 3   ARIPiprazole ER (ABILIFY MAINTENA) 400 MG PRSY prefilled syringe Inject 400 mg into the muscle every 28 (twenty-eight) days. 1 each 2   azelastine (ASTELIN) 0.1 % nasal spray Place 2 sprays into  both nostrils 2 (two) times daily. Use in each nostril as directed 30 mL 6   cetirizine (ZYRTEC) 10 MG tablet Take 10 mg by mouth every morning.     citalopram (CELEXA) 20 MG tablet Take 1 tablet (20 mg total) by mouth daily. 90 tablet 0   Continuous Blood Gluc Receiver (FREESTYLE LIBRE 2 READER) DEVI Use with libre sensors 1 each 0   Continuous Blood Gluc Sensor (FREESTYLE LIBRE 2 SENSOR) MISC 1 Device by Does not apply route every 14 (fourteen) days. 2 each 11   fenofibrate (TRICOR) 145 MG tablet TAKE ONE TABLET BY MOUTH ONCE DAILY 90 tablet 3   icosapent Ethyl (VASCEPA) 1 g capsule Take 2 g by mouth 2 (two) times daily.     ipratropium-albuterol (DUONEB) 0.5-2.5 (3) MG/3ML SOLN Take 3 mLs by nebulization every 6 (six) hours as needed. (Patient taking differently: Take 3 mLs by nebulization every 6 (six) hours as needed (shortness of breath).) 360 mL 0   losartan (COZAAR) 50 MG tablet TAKE ONE TABLET BY MOUTH EVERY MORNING 90 tablet 3   metFORMIN (GLUCOPHAGE) 500 MG tablet TAKE ONE TABLET BY MOUTH ONCE DAILY 90 tablet 0   metoprolol tartrate (LOPRESSOR) 100 MG tablet Take 1 tablet by mouth once for procedure. 1 tablet 0   mirabegron ER (MYRBETRIQ) 25 MG TB24 tablet Take 1 tablet (25 mg total) by mouth daily. 90 tablet 0   Multiple Vitamin (MULTIVITAMIN WITH MINERALS) TABS tablet Take 1 tablet by mouth daily. 50 +     Multiple Vitamins-Minerals (PRESERVISION  AREDS 2 PO) Take 1 capsule by mouth 2 (two) times daily.     naproxen sodium (ALEVE) 220 MG tablet Take 220 mg by mouth daily as needed (pain).     pantoprazole (PROTONIX) 40 MG tablet TAKE ONE TABLET BY MOUTH TWICE DAILY 180 tablet 1   temazepam (RESTORIL) 15 MG capsule Take 1 capsule (15 mg total) by mouth at bedtime. 90 capsule 0   Vitamin D, Cholecalciferol, 1000 UNITS TABS Take 1,000 Units by mouth daily.      Current Facility-Administered Medications on File Prior to Visit  Medication Dose Route Frequency Provider Last Rate Last Admin   ARIPiprazole ER (ABILIFY MAINTENA) 400 MG prefilled syringe 400 mg  400 mg Intramuscular Q28 days Arfeen, Arlyce Harman, MD   400 mg at 07/20/22 1124    BP 138/70   Pulse 73   Temp 98 F (36.7 C) (Oral)   Ht '5\' 8"'$  (1.727 m)   Wt 270 lb (122.5 kg)   SpO2 96%   BMI 41.05 kg/m       Objective:   Physical Exam Vitals and nursing note reviewed.  Constitutional:      Appearance: Normal appearance.  Cardiovascular:     Rate and Rhythm: Normal rate and regular rhythm.     Pulses: Normal pulses.     Heart sounds: Normal heart sounds.  Pulmonary:     Effort: Pulmonary effort is normal.     Breath sounds: Normal breath sounds.  Musculoskeletal:        General: Normal range of motion.  Skin:    General: Skin is warm and dry.     Capillary Refill: Capillary refill takes less than 2 seconds.  Neurological:     General: No focal deficit present.     Mental Status: He is alert and oriented to person, place, and time.  Psychiatric:        Mood and Affect: Mood normal.  Behavior: Behavior normal.        Thought Content: Thought content normal.        Judgment: Judgment normal.       Assessment & Plan:  1. Prediabetes - Consider increasing Metformin or adding GLP - Hemoglobin A1c; Future - Basic Metabolic Panel; Future - Basic Metabolic Panel - Hemoglobin A1c  2. Essential hypertension - Well controlled. No change in medications  [name], NP

## 2022-08-17 ENCOUNTER — Encounter (HOSPITAL_COMMUNITY): Payer: Self-pay | Admitting: *Deleted

## 2022-08-17 ENCOUNTER — Ambulatory Visit (HOSPITAL_BASED_OUTPATIENT_CLINIC_OR_DEPARTMENT_OTHER): Payer: PPO | Admitting: *Deleted

## 2022-08-17 ENCOUNTER — Encounter: Payer: Self-pay | Admitting: Adult Health

## 2022-08-17 VITALS — BP 150/82 | HR 85 | Resp 20 | Ht 68.0 in | Wt 269.0 lb

## 2022-08-17 DIAGNOSIS — F319 Bipolar disorder, unspecified: Secondary | ICD-10-CM | POA: Diagnosis not present

## 2022-08-17 NOTE — Patient Instructions (Signed)
Pt presents today for due Abilify Maintena 400 mg injection. Pt appropriate, pleasant and cooperative on approach. Pt states that he is feeling good and says his memory has improved greatly since being off the Cogentin. Pt says his mood has been stable since last injection. Denies any SI or HI. Injection prepared as ordered and given in [facility] without complaint. Pt to return in approximately 28 days for next due injection. Pt encouraged to call with any questions or concerns prior to next visit. Pt agrees.

## 2022-08-18 DIAGNOSIS — I10 Essential (primary) hypertension: Secondary | ICD-10-CM

## 2022-08-18 DIAGNOSIS — E785 Hyperlipidemia, unspecified: Secondary | ICD-10-CM

## 2022-08-18 DIAGNOSIS — F319 Bipolar disorder, unspecified: Secondary | ICD-10-CM

## 2022-08-30 ENCOUNTER — Telehealth: Payer: Self-pay | Admitting: Pharmacist

## 2022-08-30 NOTE — Chronic Care Management (AMB) (Signed)
Chronic Care Management Pharmacy Assistant   Name: Sean Hill  MRN: [id number] DOB: Jan 29, 1941  Reason for Encounter: Pravastatin tolerance per MP   Recent office visits:  08/16/22 [name], NP - Patient presented for Prediabetes and other concerns. Stopped Benztropine. Stopped Pravastatin  Recent consult visits:  08/17/22 Alison Murray LPN [facility]) - Patient presented for Bipolar disorder. No other visit details available.   Hospital visits:  None in previous 6 months  Medications: Outpatient Encounter Medications as of 08/30/2022  Medication Sig   albuterol (VENTOLIN HFA) 108 (90 Base) MCG/ACT inhaler Inhale 2 puffs into the lungs every 6 (six) hours as needed for wheezing or shortness of breath.   ARIPiprazole ER (ABILIFY MAINTENA) 400 MG PRSY prefilled syringe Inject 400 mg into the muscle every 28 (twenty-eight) days.   azelastine (ASTELIN) 0.1 % nasal spray Place 2 sprays into both nostrils 2 (two) times daily. Use in each nostril as directed   cetirizine (ZYRTEC) 10 MG tablet Take 10 mg by mouth every morning.   citalopram (CELEXA) 20 MG tablet Take 1 tablet (20 mg total) by mouth daily.   Continuous Blood Gluc Receiver (FREESTYLE LIBRE 2 READER) DEVI Use with libre sensors   Continuous Blood Gluc Sensor (FREESTYLE LIBRE 2 SENSOR) MISC 1 Device by Does not apply route every 14 (fourteen) days.   fenofibrate (TRICOR) 145 MG tablet TAKE ONE TABLET BY MOUTH ONCE DAILY   icosapent Ethyl (VASCEPA) 1 g capsule Take 2 g by mouth 2 (two) times daily.   ipratropium-albuterol (DUONEB) 0.5-2.5 (3) MG/3ML SOLN Take 3 mLs by nebulization every 6 (six) hours as needed. (Patient taking differently: Take 3 mLs by nebulization every 6 (six) hours as needed (shortness of breath).)   losartan (COZAAR) 50 MG tablet TAKE ONE TABLET BY MOUTH EVERY MORNING   metFORMIN (GLUCOPHAGE) 500 MG tablet TAKE ONE TABLET BY MOUTH ONCE DAILY   metoprolol tartrate (LOPRESSOR) 100 MG  tablet Take 1 tablet by mouth once for procedure.   mirabegron ER (MYRBETRIQ) 25 MG TB24 tablet Take 1 tablet (25 mg total) by mouth daily.   Multiple Vitamin (MULTIVITAMIN WITH MINERALS) TABS tablet Take 1 tablet by mouth daily. 50 +   Multiple Vitamins-Minerals (PRESERVISION AREDS 2 PO) Take 1 capsule by mouth 2 (two) times daily.   naproxen sodium (ALEVE) 220 MG tablet Take 220 mg by mouth daily as needed (pain).   pantoprazole (PROTONIX) 40 MG tablet TAKE ONE TABLET BY MOUTH TWICE DAILY   temazepam (RESTORIL) 15 MG capsule Take 1 capsule (15 mg total) by mouth at bedtime.   Vitamin D, Cholecalciferol, 1000 UNITS TABS Take 1,000 Units by mouth daily.    Facility-Administered Encounter Medications as of 08/30/2022  Medication   ARIPiprazole ER (ABILIFY MAINTENA) 400 MG prefilled syringe 400 mg   Notes: Call to patient for Pravastatin Tolerance per MP. Patient reports that other than having trouble getting to sleep he hasn't noticed any side effects with this medication. He further reports hat the medications/recommendations that he was given to help with him getting up throughout the night to use the restroom do not seem to be working as he is still having to get up all throughout the night. Advised Pharmacist for further instructions.   Care Gaps: COVID Booster - Overdue AWV- 09/23/21 CCM-12/2021 BP-138/70 08/16/22  Lab Results  Component Value Date   HGBA1C 5.9 08/16/2022    Star Rating Drugs: Pravastatin 10 mg - last filled 08/05/22 90 DS at [facility] Losartan (Cozaar)  50 mg - Last filled 08/09/22 90 DS at [facility] Pharmacist Assistant [phone]

## 2022-08-31 ENCOUNTER — Telehealth: Payer: Self-pay | Admitting: *Deleted

## 2022-08-31 NOTE — Telephone Encounter (Signed)
FYI  Myrbetriq 50 mg, #28 , Lot [id number], Exp 12/25 given to patient. Myrbetriq 25 mg is on the current medication list.

## 2022-09-01 NOTE — Progress Notes (Signed)
Per MP call to patient to advise for the urinary frequency at night that he may try 50 mg of the Myrebetriq instead of 25 to see if that is helpful for him and that she has set aside some samples of the 50 mg dose at the office for him to pick up at his convenience. Did not reach left voicemail with the above information as well as my contact in the event he has any issues or questions.   [place] Clinical Pharmacist Assistant [phone]

## 2022-09-22 ENCOUNTER — Ambulatory Visit (HOSPITAL_BASED_OUTPATIENT_CLINIC_OR_DEPARTMENT_OTHER): Payer: PPO

## 2022-09-22 ENCOUNTER — Encounter (HOSPITAL_COMMUNITY): Payer: Self-pay

## 2022-09-22 VITALS — BP 134/73 | HR 63 | Temp 97.6°F | Ht 68.0 in | Wt 266.6 lb

## 2022-09-22 DIAGNOSIS — F319 Bipolar disorder, unspecified: Secondary | ICD-10-CM | POA: Diagnosis not present

## 2022-09-22 DIAGNOSIS — F3131 Bipolar disorder, current episode depressed, mild: Secondary | ICD-10-CM

## 2022-09-22 DIAGNOSIS — F251 Schizoaffective disorder, depressive type: Secondary | ICD-10-CM | POA: Diagnosis not present

## 2022-09-22 NOTE — Patient Instructions (Signed)
Patient in today for due every 28 day injection of the Abilify Maintena 400 mg IM injection. Patient presented clean and answered questions. Patient denied any auditory or visual hallucinations, no suicidal or homicidal ideations or plans or intent to harm self or others. Patient reported still doing well with the dosage and had no complaints or concerns.  Injection given in the [place] area as due. Patient tolerated due injection without any complaint of pain or discomfort and agreed to return for next due injection.   Patient to call if any issues or problems after injection today or prior to next appointment.    Lot #[id number]    Exp 12/18/2024

## 2022-09-22 NOTE — Progress Notes (Signed)
Patient in today for due every 28 day injection of the Abilify Maintena 400 mg IM injection. Patient presented clean and answered questions. Patient denied any auditory or visual hallucinations, no suicidal or homicidal ideations or plans or intent to harm self or others. Patient reported still doing well with the dosage and had no complaints or concerns.  Injection given in the [place] area as due. Patient tolerated due injection without any complaint of pain or discomfort and agreed to return for next due injection.   Patient to call if any issues or problems after injection today or prior to next appointment.    Lot #[id number]    Exp 12/18/2024

## 2022-09-26 ENCOUNTER — Ambulatory Visit (INDEPENDENT_AMBULATORY_CARE_PROVIDER_SITE_OTHER): Payer: PPO

## 2022-09-26 ENCOUNTER — Telehealth: Payer: Self-pay | Admitting: Pharmacist

## 2022-09-26 VITALS — Ht 68.0 in | Wt 270.0 lb

## 2022-09-26 DIAGNOSIS — Z0000 Encounter for general adult medical examination without abnormal findings: Secondary | ICD-10-CM | POA: Diagnosis not present

## 2022-09-26 NOTE — Progress Notes (Signed)
Subjective:   Sean Hill is a 82 y.o. male who presents for Medicare Annual/Subsequent preventive examination.  Review of Systems    Virtual Visit via Telephone Note  I connected with  Sean Hill on 09/26/22 at  2:00 PM EST by telephone and verified that I am speaking with the correct person using two identifiers.  Location: Patient: Home Provider: Office Persons participating in the virtual visit: patient/Nurse Health Advisor   I discussed the limitations, risks, security and privacy concerns of performing an evaluation and management service by telephone and the availability of in person appointments. The patient expressed understanding and agreed to proceed.  Interactive audio and video telecommunications were attempted between this nurse and patient, however failed, due to patient having technical difficulties OR patient did not have access to video capability.  We continued and completed visit with audio only.  Some vital signs may be absent or patient reported.   [name], LPN  Cardiac Risk Factors include: advanced age (>5mn, >>23women);hypertension;male gender     Objective:    Today's Vitals   09/26/22 1404  Weight: 270 lb (122.5 kg)  Height: '5\' 8"'$  (1.727 m)   Body mass index is 41.05 kg/m.     09/26/2022    2:22 PM 07/29/2022    1:45 PM 07/14/2022    6:47 AM 11/11/2021    8:19 AM 09/23/2021    2:07 PM 08/25/2021    3:00 PM 08/25/2021    6:34 AM  Advanced Directives  Does Patient Have a Medical Advance Directive? No  No Yes Yes No No  Type of Advance Directive    [place];Healthcare Power of Attorney   Does patient want to make changes to medical advance directive?     No - Patient declined    Copy of [place] Chart?     No - copy requested No - copy requested   Would patient like information on creating a medical advance directive? No - Patient declined No -  Patient declined Yes (MAU/Ambulatory/Procedural Areas - Information given)   No - Patient declined No - Patient declined    Current Medications (verified) Outpatient Encounter Medications as of 09/26/2022  Medication Sig   albuterol (VENTOLIN HFA) 108 (90 Base) MCG/ACT inhaler Inhale 2 puffs into the lungs every 6 (six) hours as needed for wheezing or shortness of breath.   ARIPiprazole ER (ABILIFY MAINTENA) 400 MG PRSY prefilled syringe Inject 400 mg into the muscle every 28 (twenty-eight) days.   azelastine (ASTELIN) 0.1 % nasal spray Place 2 sprays into both nostrils 2 (two) times daily. Use in each nostril as directed   cetirizine (ZYRTEC) 10 MG tablet Take 10 mg by mouth every morning.   citalopram (CELEXA) 20 MG tablet Take 1 tablet (20 mg total) by mouth daily.   Continuous Blood Gluc Receiver (FREESTYLE LIBRE 2 READER) DEVI Use with libre sensors   Continuous Blood Gluc Sensor (FREESTYLE LIBRE 2 SENSOR) MISC 1 Device by Does not apply route every 14 (fourteen) days.   fenofibrate (TRICOR) 145 MG tablet TAKE ONE TABLET BY MOUTH ONCE DAILY   icosapent Ethyl (VASCEPA) 1 g capsule Take 2 g by mouth 2 (two) times daily.   ipratropium-albuterol (DUONEB) 0.5-2.5 (3) MG/3ML SOLN Take 3 mLs by nebulization every 6 (six) hours as needed. (Patient taking differently: Take 3 mLs by nebulization every 6 (six) hours as needed (shortness of breath).)   losartan (  COZAAR) 50 MG tablet TAKE ONE TABLET BY MOUTH EVERY MORNING   metFORMIN (GLUCOPHAGE) 500 MG tablet TAKE ONE TABLET BY MOUTH ONCE DAILY   metoprolol tartrate (LOPRESSOR) 100 MG tablet Take 1 tablet by mouth once for procedure.   mirabegron ER (MYRBETRIQ) 25 MG TB24 tablet Take 1 tablet (25 mg total) by mouth daily.   Multiple Vitamin (MULTIVITAMIN WITH MINERALS) TABS tablet Take 1 tablet by mouth daily. 50 +   Multiple Vitamins-Minerals (PRESERVISION AREDS 2 PO) Take 1 capsule by mouth 2 (two) times daily.   naproxen sodium (ALEVE) 220 MG tablet  Take 220 mg by mouth daily as needed (pain).   pantoprazole (PROTONIX) 40 MG tablet TAKE ONE TABLET BY MOUTH TWICE DAILY   pravastatin (PRAVACHOL) 10 MG tablet Take 10 mg by mouth every other day.   temazepam (RESTORIL) 15 MG capsule Take 1 capsule (15 mg total) by mouth at bedtime.   Vitamin D, Cholecalciferol, 1000 UNITS TABS Take 1,000 Units by mouth daily.    Facility-Administered Encounter Medications as of 09/26/2022  Medication   ARIPiprazole ER (ABILIFY MAINTENA) 400 MG prefilled syringe 400 mg    Allergies (verified) Penicillins, Lisinopril, Zetia [ezetimibe], and Codeine   History: Past Medical History:  Diagnosis Date   Anal fissure    Anxiety    Arthritis    right wrist; pt. states "everywhere"   Asthma    Bipolar disorder ([place])    Cataract    Depression    Difficult airway for intubation    GERD (gastroesophageal reflux disease)    Hemorrhage of colon following colonoscopy 10/08/2019   History of kidney stones    History of MRSA infection    left knee after arthroplasty   Hyperlipidemia    Hypertension    states under control with med., has been on med. x 1 yr.   Impaired hearing    Left bundle branch block (LBBB) on electrocardiogram 10/29/2015   Macular degeneration (senile) of retina    left   Pre-diabetes    Past Surgical History:  Procedure Laterality Date   ANKLE FUSION Bilateral    ANKLE SURGERY Bilateral    ligament surgery   BIOPSY  04/29/2020   Procedure: BIOPSY;  Surgeon: Lavena Bullion, DO;  Location: [facility];  Service: Gastroenterology;;   BIOPSY  07/14/2022   Procedure: BIOPSY;  Surgeon: Lavena Bullion, DO;  Location: [facility];  Service: Gastroenterology;;   CARDIAC CATHETERIZATION  x 2   1983; 11/14/2003   CARPOMETACARPEL SUSPENSION PLASTY Right 09/22/2016   Procedure: right thumb carpometacarpal  ARTHROPLASTY;  Surgeon: Leanora Cover, MD;  Location: [place];  Service: Orthopedics;  Laterality: Right;  right  thumb carpometacarpal  ARTHROPLASTY   CARPOMETACARPEL SUSPENSION PLASTY Right 11/02/2017   Procedure: RIGHT THUMB SUSPENSIONPLASTY WITH TIGHTROPE, DISTAL POLE SCAPHOID  EXCISION;  Surgeon: Leanora Cover, MD;  Location: [place];  Service: Orthopedics;  Laterality: Right;   COLONOSCOPY WITH PROPOFOL N/A 10/02/2019   Procedure: COLONOSCOPY WITH PROPOFOL;  Surgeon: Lavena Bullion, DO;  [facility];  Service: Gastroenterology;  Laterality: N/A;   COLONOSCOPY WITH PROPOFOL N/A 10/09/2019   Procedure: COLONOSCOPY WITH PROPOFOL;  Surgeon: Gatha Mayer, MD;  [facility];  Service: Endoscopy;  Laterality: N/A;   COLONOSCOPY WITH PROPOFOL N/A 04/29/2020   Procedure: COLONOSCOPY WITH PROPOFOL;  Surgeon: Lavena Bullion, DO;  [facility];  Service: Gastroenterology;  Laterality: N/A;   COLONOSCOPY WITH PROPOFOL N/A 11/11/2021   Procedure: COLONOSCOPY  WITH PROPOFOL;  Surgeon: Lavena Bullion, DO;  [facility];  Service: Gastroenterology;  Laterality: N/A;   ELBOW SURGERY Left    ENDOSCOPIC MUCOSAL RESECTION N/A 10/02/2019   Procedure: ENDOSCOPIC MUCOSAL RESECTION;  Surgeon: Lavena Bullion, DO;  [facility];  Service: Gastroenterology;  Laterality: N/A;   ESOPHAGOGASTRODUODENOSCOPY (EGD) WITH PROPOFOL N/A 07/14/2022   Procedure: ESOPHAGOGASTRODUODENOSCOPY (EGD) WITH PROPOFOL;  Surgeon: Lavena Bullion, DO;  [facility];  Service: Gastroenterology;  Laterality: N/A;   HEMOSTASIS CLIP PLACEMENT  10/09/2019   Procedure: HEMOSTASIS CLIP PLACEMENT;  Surgeon: Gatha Mayer, MD;  [facility];  Service: Endoscopy;;   HEMOSTASIS CLIP PLACEMENT  11/11/2021   Procedure: HEMOSTASIS CLIP PLACEMENT;  Surgeon: Lavena Bullion, DO;  [facility];  Service: Gastroenterology;;   HEMOSTASIS CLIP PLACEMENT  07/14/2022   Procedure: HEMOSTASIS CLIP PLACEMENT;  Surgeon: Lavena Bullion, DO;  [facility];  Service: Gastroenterology;;   INGUINAL HERNIA REPAIR Right    KNEE SURGERY     OPEN REDUCTION INTERNAL FIXATION (ORIF) DISTAL RADIAL FRACTURE Right 10/29/2015   Procedure: OPEN REDUCTION INTERNAL FIXATION (ORIF) DISTAL RADIAL FRACTURE;  Surgeon: Leanora Cover, MD;  Location: [place];  Service: Orthopedics;  Laterality: Right;   ORIF ELBOW FRACTURE Right    POLYPECTOMY  04/29/2020   Procedure: POLYPECTOMY;  Surgeon: Lavena Bullion, DO;  [facility];  Service: Gastroenterology;;   POLYPECTOMY  11/11/2021   Procedure: POLYPECTOMY;  Surgeon: Lavena Bullion, DO;  [facility];  Service: Gastroenterology;;   POLYPECTOMY  07/14/2022   Procedure: POLYPECTOMY;  Surgeon: Lavena Bullion, DO;  [facility];  Service: Gastroenterology;;   [place] DILATION N/A 07/14/2022   Procedure: SAVORY DILATION;  Surgeon: Lavena Bullion, DO;  [facility];  Service: Gastroenterology;  Laterality: N/A;   SUBMUCOSAL LIFTING INJECTION  10/02/2019   Procedure: SUBMUCOSAL LIFTING INJECTION;  Surgeon: Lavena Bullion, DO;  [facility];  Service: Gastroenterology;;   TONSILLECTOMY     TOTAL KNEE ARTHROPLASTY Bilateral    TOTAL KNEE REVISION Right 08/25/2021   Procedure: Right knee polyethylene vs total knee arthroplasty revision;  Surgeon: Gaynelle Arabian, MD;  [facility];  Service: Orthopedics;  Laterality: Right;   ULNAR COLLATERAL LIGAMENT REPAIR Right 12/29/2015   Procedure: REPAIR  RIGHT LATERAL ULNAR COLLATERAL LIGAMENT TEAR  EXTENSOR ORIGIN ;  Surgeon: Leanora Cover, MD;  Location: [place];  Service: Orthopedics;  Laterality: Right;   WRIST ARTHROSCOPY WITH DEBRIDEMENT Right 07/14/2016   Procedure: RIGHT WRIST ARTHROSCOPY WITH DEBRIDEMENT  TRIANGULAR FIBROCARTILAGE COMPLEX;  Surgeon: Leanora Cover, MD;  Location: [place];  Service: Orthopedics;  Laterality: Right;   Family History   Problem Relation Age of Onset   Alcohol abuse Mother    Ovarian cancer Mother    Alcohol abuse Father    Pancreatic cancer Father    Hyperlipidemia Brother    Barrett's esophagus Brother    Colon cancer Maternal Grandmother    Depression Daughter    Paranoid behavior Daughter    Esophageal cancer Neg Hx    Stomach cancer Neg Hx    Rectal cancer Neg Hx    Colon polyps Neg Hx    Social History   Socioeconomic History   Marital status: Married    Spouse name: Not on file   Number of children: Not on file   Years of education: Not on file   Highest education level: Not on  file  Occupational History   Not on file  Tobacco Use   Smoking status: Never   Smokeless tobacco: Never  Vaping Use   Vaping Use: Never used  Substance and Sexual Activity   Alcohol use: Not Currently    Comment: occasional.   Drug use: No   Sexual activity: Not Currently    Birth control/protection: None  Other Topics Concern   Not on file  Social History Narrative   Married, lives in [place].  Retired Dance movement psychotherapist.   Two daughters, both married,3 grandchildren    No regular exercise.  Never smoker.  No ETOH.  No drugs.   Social Determinants of Health   Financial Resource Strain: Low Risk  (09/26/2022)   Overall Financial Resource Strain (CARDIA)    Difficulty of Paying Living Expenses: Not hard at all  Food Insecurity: No Food Insecurity (09/26/2022)   Hunger Vital Sign    Worried About Running Out of Food in the Last Year: Never true    Ran Out of Food in the Last Year: Never true  Transportation Needs: No Transportation Needs (09/26/2022)   PRAPARE - Hydrologist (Medical): No    Lack of Transportation (Non-Medical): No  Physical Activity: Inactive (09/26/2022)   Exercise Vital Sign    Days of Exercise per Week: 0 days    Minutes of Exercise per Session: 0 min  Stress: No Stress Concern Present (09/26/2022)   [place]    Feeling of Stress : Not at all  Social Connections: Gas (09/26/2022)   Social Connection and Isolation Panel [NHANES]    Frequency of Communication with Friends and Family: More than three times a week    Frequency of Social Gatherings with Friends and Family: More than three times a week    Attends Religious Services: More than 4 times per year    Active Member of [employer] or Organizations: Yes    Attends Music therapist: More than 4 times per year    Marital Status: Married    Tobacco Counseling Counseling given: Not Answered   Clinical Intake:  Pre-visit preparation completed: Yes  Pain : No/denies pain     BMI - recorded: 41.05 Nutritional Status: BMI > 30  Obese Nutritional Risks: None Diabetes: No  How often do you need to have someone help you when you read instructions, pamphlets, or other written materials from your doctor or pharmacy?: 3 - Sometimes (Wife assist)  Diabetic?  No  Interpreter Needed?: No  Information entered by :: Rolene Arbour LPN   Activities of Daily Living    09/26/2022    2:14 PM 09/26/2022    2:13 PM  In your present state of health, do you have any difficulty performing the following activities:  Hearing? 1 1  Comment  Wears hearing aids  Vision? 0 0  Difficulty concentrating or making decisions? 0 1  Walking or climbing stairs? 1 1  Comment Knee replacment and ankle infusement, Followed by Orthopedic   Dressing or bathing? 1 1  Comment Followed by Orthopedic   Doing errands, shopping? 1 1  Comment Wife Diplomatic Services operational officer and eating ? N N  Using the Toilet? N N  In the past six months, have you accidently leaked urine? Y Y  Comment Followed by PCP followed by PCP  Do you have problems with loss of bowel control? N N  Managing your Medications? N  N  Managing your Finances? N N  Housekeeping or managing your Housekeeping? Aggie Moats  Comment Wife assist     Patient Care  Team: [name], NP as PCP - General (Family Medicine) Minus Breeding, MD as PCP - Cardiology (Cardiology) Leanora Cover, MD as Consulting Physician (Orthopedic Surgery) Viona Gilmore, [facility] as Pharmacist (Pharmacist)  Indicate any recent Medical Services you may have received from other than [facility] providers in the past year (date may be approximate).     Assessment:   This is a routine wellness examination for Sean Hill.  Hearing/Vision screen Hearing Screening - Comments:: Wears hearing aids Vision Screening - Comments:: Wears rx glasses - up to date with routine eye exams with  Dr Sharen Counter  Dietary issues and exercise activities discussed: Current Exercise Habits: The patient does not participate in regular exercise at present, Exercise limited by: orthopedic condition(s)   Goals Addressed               This Visit's Progress     No current goals (pt-stated)         Depression Screen    09/26/2022    2:12 PM 07/29/2022    1:44 PM 05/05/2022    9:14 AM 02/17/2022    2:05 PM 09/23/2021    1:59 PM 09/25/2020    2:57 PM 11/07/2018    7:08 AM  PHQ 2/9 Scores  PHQ - 2 Score 0 0 0 1 0 0 0  PHQ- 9 Score   9 8       Fall Risk    09/26/2022    2:20 PM 09/20/2022    6:50 PM 05/05/2022    9:14 AM 09/23/2021    2:01 PM 09/25/2020    2:56 PM  Fall Risk   Falls in the past year? 1 1 0 0 0  Number falls in past yr: 0 0 0 0 0  Injury with Fall? 0 0 0 1 0  Comment    Patient factured left arm   Risk for fall due to : No Fall Risks  No Fall Risks  History of fall(s)  Follow up   Falls evaluation completed  Falls evaluation completed    [place]:  Any stairs in or around the home? Yes  If so, are there any without handrails? No  Home free of loose throw rugs in walkways, pet beds, electrical cords, etc? Yes  Adequate lighting in your home to reduce risk of falls? Yes   ASSISTIVE DEVICES UTILIZED TO PREVENT FALLS:  Life alert? No  Use of a cane,  walker or w/c? Yes  Grab bars in the bathroom? Yes  Shower chair or bench in shower? No  Elevated toilet seat or a handicapped toilet? No   TIMED UP AND GO:  Was the test performed? No . Audio Visit  Cognitive Function:        09/26/2022    2:22 PM 09/23/2021    2:05 PM  6CIT Screen  What Year? 0 points 0 points  What month? 0 points 0 points  What time? 0 points 0 points  Count back from 20 0 points 0 points  Months in reverse 0 points 0 points  Repeat phrase 0 points 0 points  Total Score 0 points 0 points    Immunizations Immunization History  Administered Date(s) Administered   Fluad Quad(high Dose 65+) 05/29/2019, 06/04/2021, 05/24/2022   Influenza Inj Mdck Quad Pf 05/29/2019   Influenza, High Dose  Seasonal PF 06/07/2016, 05/15/2017   Influenza-Unspecified 07/20/2013, 06/16/2020   Moderna Covid-19 Vaccine Bivalent Booster 24yr & up 01/17/2022   PFIZER Comirnaty(Gray Top)Covid-19 Tri-Sucrose Vaccine 01/29/2021   PFIZER(Purple Top)SARS-COV-2 Vaccination 10/25/2019, 11/19/2019, 06/16/2020   Pfizer Covid-19 Vaccine Bivalent Booster 154yr& up 06/09/2021   Pneumococcal Conjugate-13 03/11/2015   Pneumococcal Polysaccharide-23 07/06/2010   Respiratory Syncytial Virus Vaccine,Recomb Aduvanted(Arexvy) 08/25/2022   Tdap 07/06/2010, 10/29/2015   Unspecified SARS-COV-2 Vaccination 06/20/2022   Zoster Recombinat (Shingrix) 01/29/2021, 05/18/2021    TDAP status: Up to date  Flu Vaccine status: Up to date  Pneumococcal vaccine status: Up to date  Covid-19 vaccine status: Completed vaccines  Qualifies for Shingles Vaccine? Yes   Zostavax completed Yes   Shingrix Completed?: Yes  Screening Tests Health Maintenance  Topic Date Due   COVID-19 Vaccine (8 - 2023-24 season) 10/12/2022 (Originally 08/15/2022)   Medicare Annual Wellness (AWV)  09/27/2023   DTaP/Tdap/Td (3 - Td or Tdap) 10/28/2025   Pneumonia Vaccine 6540Years old  Completed   INFLUENZA VACCINE  Completed    Hepatitis C Screening  Completed   Zoster Vaccines- Shingrix  Completed   HPV VACCINES  Aged Out    Health Maintenance  There are no preventive care reminders to display for this patient.   Colorectal cancer screening: No longer required.   Lung Cancer Screening: (Low Dose CT Chest recommended if Age 80-80ears, 30 pack-year currently smoking OR have quit w/in 15years.) does not qualify.     Additional Screening:  Hepatitis C Screening: does not qualify; Completed   Vision Screening: Recommended annual ophthalmology exams for early detection of glaucoma and other disorders of the eye. Is the patient up to date with their annual eye exam?  Yes  Who is the provider or what is the name of the office in which the patient attends annual eye exams? Dr PeSharen Counterf pt is not established with a provider, would they like to be referred to a provider to establish care? No .   Dental Screening: Recommended annual dental exams for proper oral hygiene  Community Resource Referral / Chronic Care Management:  CRR required this visit?  No   CCM required this visit?  No      Plan:     I have personally reviewed and noted the following in the patient's chart:   Medical and social history Use of alcohol, tobacco or illicit drugs  Current medications and supplements including opioid prescriptions. Patient is not currently taking opioid prescriptions. Functional ability and status Nutritional status Physical activity Advanced directives List of other physicians Hospitalizations, surgeries, and ER visits in previous 12 months Vitals Screenings to include cognitive, depression, and falls Referrals and appointments  In addition, I have reviewed and discussed with patient certain preventive protocols, quality metrics, and best practice recommendations. A written personalized care plan for preventive services as well as general preventive health recommendations were provided to patient.      BeCriselda PeachesLPN   1/0/09/6008 Nurse Notes: None

## 2022-09-26 NOTE — Patient Instructions (Addendum)
Mr. Sean Hill , Thank you for taking time to come for your Medicare Wellness Visit. I appreciate your ongoing commitment to your health goals. Please review the following plan we discussed and let me know if I can assist you in the future.   These are the goals we discussed:  Goals       Exercise 3x per week (30 min per time)      No current goals (pt-stated)        This is a list of the screening recommended for you and due dates:  Health Maintenance  Topic Date Due   COVID-19 Vaccine (8 - 2023-24 season) 10/12/2022*   Medicare Annual Wellness Visit  09/27/2023   DTaP/Tdap/Td vaccine (3 - Td or Tdap) 10/28/2025   Pneumonia Vaccine  Completed   Flu Shot  Completed   Hepatitis C Screening: USPSTF Recommendation to screen - Ages 18-79 yo.  Completed   Zoster (Shingles) Vaccine  Completed   HPV Vaccine  Aged Out  *Topic was postponed. The date shown is not the original due date.    Advanced directives: Advance directive discussed with you today. Even though you declined this today, please call our office should you change your mind, and we can give you the proper paperwork for you to fill out.   Conditions/risks identified: None  Next appointment: Follow up in one year for your annual wellness visit.    Preventive Care 44 Years and Older, Male  Preventive care refers to lifestyle choices and visits with your health care provider that can promote health and wellness. What does preventive care include? A yearly physical exam. This is also called an annual well check. Dental exams once or twice a year. Routine eye exams. Ask your health care provider how often you should have your eyes checked. Personal lifestyle choices, including: Daily care of your teeth and gums. Regular physical activity. Eating a healthy diet. Avoiding tobacco and drug use. Limiting alcohol use. Practicing safe sex. Taking low doses of aspirin every day. Taking vitamin and mineral supplements as  recommended by your health care provider. What happens during an annual well check? The services and screenings done by your health care provider during your annual well check will depend on your age, overall health, lifestyle risk factors, and family history of disease. Counseling  Your health care provider may ask you questions about your: Alcohol use. Tobacco use. Drug use. Emotional well-being. Home and relationship well-being. Sexual activity. Eating habits. History of falls. Memory and ability to understand (cognition). Work and work Statistician. Screening  You may have the following tests or measurements: Height, weight, and BMI. Blood pressure. Lipid and cholesterol levels. These may be checked every 5 years, or more frequently if you are over 39 years old. Skin check. Lung cancer screening. You may have this screening every year starting at age 67 if you have a 30-pack-year history of smoking and currently smoke or have quit within the past 15 years. Fecal occult blood test (FOBT) of the stool. You may have this test every year starting at age 38. Flexible sigmoidoscopy or colonoscopy. You may have a sigmoidoscopy every 5 years or a colonoscopy every 10 years starting at age 2. Prostate cancer screening. Recommendations will vary depending on your family history and other risks. Hepatitis C blood test. Hepatitis B blood test. Sexually transmitted disease (STD) testing. Diabetes screening. This is done by checking your blood sugar (glucose) after you have not eaten for a while (fasting). You may  have this done every 1-3 years. Abdominal aortic aneurysm (AAA) screening. You may need this if you are a current or former smoker. Osteoporosis. You may be screened starting at age 45 if you are at high risk. Talk with your health care provider about your test results, treatment options, and if necessary, the need for more tests. Vaccines  Your health care provider may recommend  certain vaccines, such as: Influenza vaccine. This is recommended every year. Tetanus, diphtheria, and acellular pertussis (Tdap, Td) vaccine. You may need a Td booster every 10 years. Zoster vaccine. You may need this after age 40. Pneumococcal 13-valent conjugate (PCV13) vaccine. One dose is recommended after age 80. Pneumococcal polysaccharide (PPSV23) vaccine. One dose is recommended after age 80. Talk to your health care provider about which screenings and vaccines you need and how often you need them. This information is not intended to replace advice given to you by your health care provider. Make sure you discuss any questions you have with your health care provider. Document Released: 10/02/2015 Document Revised: 05/25/2016 Document Reviewed: 07/07/2015 Elsevier Interactive Patient Education  2017 [employer] [employer] Prevention in the Home Falls can cause injuries. They can happen to people of all ages. There are many things you can do to make your home safe and to help prevent falls. What can I do on the outside of my home? Regularly fix the edges of walkways and driveways and fix any cracks. Remove anything that might make you trip as you walk through a door, such as a raised step or threshold. Trim any bushes or trees on the path to your home. Use bright outdoor lighting. Clear any walking paths of anything that might make someone trip, such as rocks or tools. Regularly check to see if handrails are loose or broken. Make sure that both sides of any steps have handrails. Any raised decks and porches should have guardrails on the edges. Have any leaves, snow, or ice cleared regularly. Use sand or salt on walking paths during winter. Clean up any spills in your garage right away. This includes oil or grease spills. What can I do in the bathroom? Use night lights. Install grab bars by the toilet and in the tub and shower. Do not use towel bars as grab bars. Use non-skid mats or  decals in the tub or shower. If you need to sit down in the shower, use a plastic, non-slip stool. Keep the floor dry. Clean up any water that spills on the floor as soon as it happens. Remove soap buildup in the tub or shower regularly. Attach bath mats securely with double-sided non-slip rug tape. Do not have throw rugs and other things on the floor that can make you trip. What can I do in the bedroom? Use night lights. Make sure that you have a light by your bed that is easy to reach. Do not use any sheets or blankets that are too big for your bed. They should not hang down onto the floor. Have a firm chair that has side arms. You can use this for support while you get dressed. Do not have throw rugs and other things on the floor that can make you trip. What can I do in the kitchen? Clean up any spills right away. Avoid walking on wet floors. Keep items that you use a lot in easy-to-reach places. If you need to reach something above you, use a strong step stool that has a grab bar. Keep electrical cords  out of the way. Do not use floor polish or wax that makes floors slippery. If you must use wax, use non-skid floor wax. Do not have throw rugs and other things on the floor that can make you trip. What can I do with my stairs? Do not leave any items on the stairs. Make sure that there are handrails on both sides of the stairs and use them. Fix handrails that are broken or loose. Make sure that handrails are as long as the stairways. Check any carpeting to make sure that it is firmly attached to the stairs. Fix any carpet that is loose or worn. Avoid having throw rugs at the top or bottom of the stairs. If you do have throw rugs, attach them to the floor with carpet tape. Make sure that you have a light switch at the top of the stairs and the bottom of the stairs. If you do not have them, ask someone to add them for you. What else can I do to help prevent falls? Wear shoes that: Do not  have high heels. Have rubber bottoms. Are comfortable and fit you well. Are closed at the toe. Do not wear sandals. If you use a stepladder: Make sure that it is fully opened. Do not climb a closed stepladder. Make sure that both sides of the stepladder are locked into place. Ask someone to hold it for you, if possible. Clearly mark and make sure that you can see: Any grab bars or handrails. First and last steps. Where the edge of each step is. Use tools that help you move around (mobility aids) if they are needed. These include: Canes. Walkers. Scooters. Crutches. Turn on the lights when you go into a dark area. Replace any light bulbs as soon as they burn out. Set up your furniture so you have a clear path. Avoid moving your furniture around. If any of your floors are uneven, fix them. If there are any pets around you, be aware of where they are. Review your medicines with your doctor. Some medicines can make you feel dizzy. This can increase your chance of falling. Ask your doctor what other things that you can do to help prevent falls. This information is not intended to replace advice given to you by your health care provider. Make sure you discuss any questions you have with your health care provider. Document Released: 07/02/2009 Document Revised: 02/11/2016 Document Reviewed: 10/10/2014 Elsevier Interactive Patient Education  2017 [employer].

## 2022-09-26 NOTE — Telephone Encounter (Signed)
Called patient as there were still Myrbetriq samples waiting for him to pick up. Patient reports his symptoms have improved and he is not interested in samples right now. He is no longer taking the Myrbetriq at all. He will call the office if he changes his mind.

## 2022-10-11 DIAGNOSIS — H02051 Trichiasis without entropian right upper eyelid: Secondary | ICD-10-CM | POA: Diagnosis not present

## 2022-10-13 ENCOUNTER — Telehealth: Payer: Self-pay | Admitting: Pharmacist

## 2022-10-13 NOTE — Progress Notes (Signed)
Patient ID: Sean Hill, male   DOB: 1941-09-08, 82 y.o.   MRN: [id number] Care Management & Coordination Services Pharmacy Team  Reason for Encounter: [place] information for [name] for Vascepa as follows  Renewal Date 10/18/2022 End Date 10/18/2023 Card # [id number] BIN [id number] PCN [id number] PC Group [id number] Provider PDMO Processor PDMI   Chart Updates:  Recent office visits:  09/26/22 Sean Peaches, LPN - Patient presented for [facility] Annual Wellness Exam. No medication changes.   Recent consult visits:  09/22/22 Sean Hill CMA [facility]) - Patient presented for Bipolar affective disorder and other concerns. No other visit details available.   Hospital visits:  Patient presented to [facility] on 07/14/22 for EGD with Propofol. Patient was present for 3 hours. No medication changes.   Medications: Outpatient Encounter Medications as of 10/13/2022  Medication Sig   albuterol (VENTOLIN HFA) 108 (90 Base) MCG/ACT inhaler Inhale 2 puffs into the lungs every 6 (six) hours as needed for wheezing or shortness of breath.   ARIPiprazole ER (ABILIFY MAINTENA) 400 MG PRSY prefilled syringe Inject 400 mg into the muscle every 28 (twenty-eight) days.   azelastine (ASTELIN) 0.1 % nasal spray Place 2 sprays into both nostrils 2 (two) times daily. Use in each nostril as directed   cetirizine (ZYRTEC) 10 MG tablet Take 10 mg by mouth every morning.   citalopram (CELEXA) 20 MG tablet Take 1 tablet (20 mg total) by mouth daily.   Continuous Blood Gluc Receiver (FREESTYLE LIBRE 2 READER) DEVI Use with libre sensors   Continuous Blood Gluc Sensor (FREESTYLE LIBRE 2 SENSOR) MISC 1 Device by Does not apply route every 14 (fourteen) days.   fenofibrate (TRICOR) 145 MG tablet TAKE ONE TABLET BY MOUTH ONCE DAILY   icosapent Ethyl (VASCEPA) 1 g capsule Take 2 g by mouth 2 (two) times daily.   ipratropium-albuterol (DUONEB) 0.5-2.5 (3) MG/3ML  SOLN Take 3 mLs by nebulization every 6 (six) hours as needed. (Patient taking differently: Take 3 mLs by nebulization every 6 (six) hours as needed (shortness of breath).)   losartan (COZAAR) 50 MG tablet TAKE ONE TABLET BY MOUTH EVERY MORNING   metFORMIN (GLUCOPHAGE) 500 MG tablet TAKE ONE TABLET BY MOUTH ONCE DAILY   metoprolol tartrate (LOPRESSOR) 100 MG tablet Take 1 tablet by mouth once for procedure.   Multiple Vitamin (MULTIVITAMIN WITH MINERALS) TABS tablet Take 1 tablet by mouth daily. 50 +   Multiple Vitamins-Minerals (PRESERVISION AREDS 2 PO) Take 1 capsule by mouth 2 (two) times daily.   naproxen sodium (ALEVE) 220 MG tablet Take 220 mg by mouth daily as needed (pain).   pantoprazole (PROTONIX) 40 MG tablet TAKE ONE TABLET BY MOUTH TWICE DAILY   pravastatin (PRAVACHOL) 10 MG tablet Take 10 mg by mouth every other day.   temazepam (RESTORIL) 15 MG capsule Take 1 capsule (15 mg total) by mouth at bedtime.   Vitamin D, Cholecalciferol, 1000 UNITS TABS Take 1,000 Units by mouth daily.    Facility-Administered Encounter Medications as of 10/13/2022  Medication   ARIPiprazole ER (ABILIFY MAINTENA) 400 MG prefilled syringe 400 mg    Recent vitals BP Readings from Last 3 Encounters:  08/16/22 138/70  07/14/22 (!) 129/49  05/26/22 125/78   Pulse Readings from Last 3 Encounters:  08/16/22 73  07/14/22 63  05/26/22 85   Wt Readings from Last 3 Encounters:  09/26/22 270 lb (122.5 kg)  08/16/22 270 lb (122.5 kg)  07/14/22 260  lb (117.9 kg)   BMI Readings from Last 3 Encounters:  09/26/22 41.05 kg/m  08/16/22 41.05 kg/m  07/14/22 38.40 kg/m    Recent lab results    Component Value Date/Time   NA 139 08/16/2022 1148   NA 146 (H) 05/26/2022 1006   K 3.9 08/16/2022 1148   CL 104 08/16/2022 1148   CO2 26 08/16/2022 1148   GLUCOSE 119 (H) 08/16/2022 1148   BUN 15 08/16/2022 1148   BUN 14 05/26/2022 1006   CREATININE 1.03 08/16/2022 1148   CREATININE 1.00 09/24/2019 1221    CALCIUM 9.6 08/16/2022 1148    Lab Results  Component Value Date   CREATININE 1.03 08/16/2022   GFR 69.00 08/16/2022   EGFR 66 05/26/2022   GFRNONAA >60 08/26/2021   GFRAA >60 10/09/2019   Lab Results  Component Value Date/Time   HGBA1C 5.9 08/16/2022 11:48 AM   HGBA1C 6.2 05/05/2022 10:07 AM   MICROALBUR 1.5 11/28/2011 04:28 PM    Lab Results  Component Value Date   CHOL 173 05/05/2022   HDL 39.20 05/05/2022   LDLDIRECT 106.0 05/05/2022   TRIG 218.0 (H) 05/05/2022   CHOLHDL 4 05/05/2022    Care Gaps: BP- 138/70 08/16/22 COVID Booster - Overdue Annual wellness visit in last year? Yes 09/26/22   Star Rating Drugs:  Pravastatin 10 mg - last filled 08/05/22 90 DS at [facility] Losartan (Cozaar) 50 mg - Last filled 08/09/22 90 DS at [facility] Pharmacist Assistant [phone]

## 2022-10-18 ENCOUNTER — Ambulatory Visit (HOSPITAL_BASED_OUTPATIENT_CLINIC_OR_DEPARTMENT_OTHER): Payer: PPO | Admitting: *Deleted

## 2022-10-18 VITALS — BP 138/69 | HR 73 | Resp 20 | Ht 68.0 in | Wt 267.8 lb

## 2022-10-18 DIAGNOSIS — F319 Bipolar disorder, unspecified: Secondary | ICD-10-CM

## 2022-10-18 NOTE — Patient Instructions (Signed)
Pt in clinic today for sue Abilify Maintena 400 mg injection. Pt is pleasant and cooperative on approach. Affect appropriate to mood. Pt says that he's been doing well since last injection. Pt says he had some hypomania previous to last injection but that has leveled out. Denies SI. No AVH. Injection prepared and given as ordered in [facility] without complaint. Pt to return in approximately 28 days for next due injection. Pt will call with any questions or concerns prior to next visit.

## 2022-10-26 ENCOUNTER — Other Ambulatory Visit: Payer: Self-pay | Admitting: Gastroenterology

## 2022-10-26 ENCOUNTER — Other Ambulatory Visit: Payer: Self-pay | Admitting: Adult Health

## 2022-10-26 DIAGNOSIS — E7849 Other hyperlipidemia: Secondary | ICD-10-CM

## 2022-10-27 ENCOUNTER — Other Ambulatory Visit (HOSPITAL_COMMUNITY): Payer: Self-pay | Admitting: Psychiatry

## 2022-10-27 ENCOUNTER — Telehealth: Payer: Self-pay

## 2022-10-27 DIAGNOSIS — F411 Generalized anxiety disorder: Secondary | ICD-10-CM

## 2022-10-27 DIAGNOSIS — F319 Bipolar disorder, unspecified: Secondary | ICD-10-CM

## 2022-10-27 NOTE — Progress Notes (Signed)
Patient ID: Sean Hill, male   DOB: 31-Oct-1940, 82 y.o.   MRN: [id number] Care Management & Coordination Services Pharmacy Team  Reason for Encounter: Medication coordination and delivery  Contacted patient to discuss medications and coordinate delivery from [facility] pharmacy. Spoke with Wife on 10/27/2022  Cycle dispensing form sent to [facility] for review.   Last adherence delivery date:08/09/22      Patient is due for next adherence delivery on: 11/08/22  This delivery to include: Adherence Packaging  90 Days    Cetirizine 10 mg: one tablet at breakfast Mens Multivitamin : Take one at breakfast Preservision Areds-2gt: one at breakfast and one at bedtime   Vitamin D3 1000 u: one tablet at breakfast Fenofibrate 145 mg: one tablet at breakfast Losartan (COZAAR) 50 MG tablet: one tablet at breakfast Pantoprazole (Protonix) 40 mg : one tab at breakfast and one at dinner Vascepa 1 gm: Take 2g at breakfast and 2g at Dinner Metformin 500 mg: one tablet at breakfast   CITALOPRAM HBR 20 MG TABLET 07/28/2022 90   Refills requested from providers include: temazepam 15 mg capsule  CITALOPRAM HBR 20 MG  Arfeen, Arlyce Harman, MD [phone]  Confirmed delivery date of 11/08/22, advised patient that pharmacy will contact them the morning of delivery.   Any concerns about your medications? Yes Pravastatin 10 mg: Take one tab every other day at bedtime (patient reports he is not taking this medication) removed from order pharmacist advised  How often do you forget or accidentally miss a dose? Never  Do you use a pillbox? No  Is patient in packaging Yes    Chart review: Recent office visits:  09/26/22 [name], LPN - Patient presented  via phone for Medicare annual wellness exam. No medication changes.  Recent consult visits:  10/18/22 Alison Murray [facility]) - Patient presented for Bipolar disorder. No other visit details available.   Hospital visits:  Patient  presented to [facility] on 07/14/22 for EGD with Propofol. Patient was present for 3 hours. No medication changes   Medications: Outpatient Encounter Medications as of 10/27/2022  Medication Sig   albuterol (VENTOLIN HFA) 108 (90 Base) MCG/ACT inhaler Inhale 2 puffs into the lungs every 6 (six) hours as needed for wheezing or shortness of breath.   ARIPiprazole ER (ABILIFY MAINTENA) 400 MG PRSY prefilled syringe Inject 400 mg into the muscle every 28 (twenty-eight) days.   azelastine (ASTELIN) 0.1 % nasal spray Place 2 sprays into both nostrils 2 (two) times daily. Use in each nostril as directed   cetirizine (ZYRTEC) 10 MG tablet Take 10 mg by mouth every morning.   citalopram (CELEXA) 20 MG tablet Take 1 tablet (20 mg total) by mouth daily.   Continuous Blood Gluc Receiver (FREESTYLE LIBRE 2 READER) DEVI Use with libre sensors   Continuous Blood Gluc Sensor (FREESTYLE LIBRE 2 SENSOR) MISC 1 Device by Does not apply route every 14 (fourteen) days.   fenofibrate (TRICOR) 145 MG tablet TAKE ONE TABLET BY MOUTH ONCE DAILY   ipratropium-albuterol (DUONEB) 0.5-2.5 (3) MG/3ML SOLN Take 3 mLs by nebulization every 6 (six) hours as needed. (Patient taking differently: Take 3 mLs by nebulization every 6 (six) hours as needed (shortness of breath).)   losartan (COZAAR) 50 MG tablet TAKE ONE TABLET BY MOUTH EVERY MORNING   metFORMIN (GLUCOPHAGE) 500 MG tablet TAKE ONE TABLET BY MOUTH ONCE DAILY   metoprolol tartrate (LOPRESSOR) 100 MG tablet Take 1 tablet by mouth once for procedure.  Multiple Vitamin (MULTIVITAMIN WITH MINERALS) TABS tablet Take 1 tablet by mouth daily. 50 +   Multiple Vitamins-Minerals (PRESERVISION AREDS 2 PO) Take 1 capsule by mouth 2 (two) times daily.   naproxen sodium (ALEVE) 220 MG tablet Take 220 mg by mouth daily as needed (pain).   pantoprazole (PROTONIX) 40 MG tablet TAKE ONE TABLET BY MOUTH TWICE DAILY   pravastatin (PRAVACHOL) 10 MG tablet TAKE ONE TABLET BY MOUTH  every other DAY   temazepam (RESTORIL) 15 MG capsule Take 1 capsule (15 mg total) by mouth at bedtime.   VASCEPA 1 g capsule TAKE TWO CAPSULES BY MOUTH TWICE DAILY   Vitamin D, Cholecalciferol, 1000 UNITS TABS Take 1,000 Units by mouth daily.    Facility-Administered Encounter Medications as of 10/27/2022  Medication   ARIPiprazole ER (ABILIFY MAINTENA) 400 MG prefilled syringe 400 mg   BP Readings from Last 3 Encounters:  08/16/22 138/70  07/14/22 (!) 129/49  05/26/22 125/78    Pulse Readings from Last 3 Encounters:  08/16/22 73  07/14/22 63  05/26/22 85    Lab Results  Component Value Date/Time   HGBA1C 5.9 08/16/2022 11:48 AM   HGBA1C 6.2 05/05/2022 10:07 AM   Lab Results  Component Value Date   CREATININE 1.03 08/16/2022   BUN 15 08/16/2022   GFR 69.00 08/16/2022   GFRNONAA >60 08/26/2021   GFRAA >60 10/09/2019   NA 139 08/16/2022   K 3.9 08/16/2022   CALCIUM 9.6 08/16/2022   CO2 26 08/16/2022     [place] Clinical Pharmacist Assistant [phone]

## 2022-10-28 ENCOUNTER — Telehealth (HOSPITAL_BASED_OUTPATIENT_CLINIC_OR_DEPARTMENT_OTHER): Payer: PPO | Admitting: Psychiatry

## 2022-10-28 ENCOUNTER — Encounter (HOSPITAL_COMMUNITY): Payer: Self-pay | Admitting: Psychiatry

## 2022-10-28 DIAGNOSIS — F319 Bipolar disorder, unspecified: Secondary | ICD-10-CM | POA: Diagnosis not present

## 2022-10-28 DIAGNOSIS — F411 Generalized anxiety disorder: Secondary | ICD-10-CM

## 2022-10-28 MED ORDER — ARIPIPRAZOLE ER 400 MG IM PRSY: 400.0000 mg | PREFILLED_SYRINGE | INTRAMUSCULAR | 2 refills | Status: DC

## 2022-10-28 MED ORDER — CITALOPRAM HYDROBROMIDE 20 MG PO TABS: 20.0000 mg | ORAL_TABLET | Freq: Every day | ORAL | 0 refills | Status: DC

## 2022-10-28 MED ORDER — TEMAZEPAM 15 MG PO CAPS: 15.0000 mg | ORAL_CAPSULE | Freq: Every day | ORAL | 0 refills | Status: DC

## 2022-10-28 NOTE — Progress Notes (Signed)
Virtual Visit via Telephone Note  I connected with Sean Hill on 10/28/22 at 10:40 AM EST by telephone and verified that I am speaking with the correct person using two identifiers.  Location: Patient: Home Provider: Home Office   I discussed the limitations, risks, security and privacy concerns of performing an evaluation and management service by telephone and the availability of in person appointments. I also discussed with the patient that there may be a patient responsible charge related to this service. The patient expressed understanding and agreed to proceed.   History of Present Illness: Patient is evaluated by phone session.  Since stopped the Cogentin he feels more alert, motivated and did not have any dry mouth.  He feels tremors are stable and not worsening.  Recently had a blood work and he is pleased that his hemoglobin A1c is less than 6.  He also lost a few pounds.  His primary care doctor started him on metformin.  Next month he is having 82th birthday and he is looking forward to him.  He denies any mania, hallucination, psychosis or any highs and lows.  He denies any irritability or any anger.  He sleeps good with temazepam.  He is getting Abilify injection which is helping controlling his mania.  Like to keep his current medication.   Past Psychiatric History:  H/O multiple hospitalization.  Last inpatient in July 2014. H/O overdose on Latuda with alcohol. H/O cutting wrist.  Took Tegretol, Depakote, Ambien, Remeron, Vistaril, Geodon, Abilify, lithium, Provigil, Zoloft, Neurontin, Lamictal, Wellbutrin, Risperdal and Pristiq. H/O mania, aggression, getting speeding tickets, excessive buying and impulsive behavior.  Recent Results (from the past 2160 hour(s))  Basic Metabolic Panel     Status: Abnormal   Collection Time: 08/16/22 11:48 AM  Result Value Ref Range   Sodium 139 135 - 145 mEq/L   Potassium 3.9 3.5 - 5.1 mEq/L   Chloride 104 96 - 112 mEq/L   CO2 26 19 -  32 mEq/L   Glucose, Bld 119 (H) 70 - 99 mg/dL   BUN 15 6 - 23 mg/dL   Creatinine, Ser 1.03 0.40 - 1.50 mg/dL   GFR 69.00 >60.00 mL/min    Comment: Calculated using the CKD-EPI Creatinine Equation (2021)   Calcium 9.6 8.4 - 10.5 mg/dL  Hemoglobin A1c     Status: None   Collection Time: 08/16/22 11:48 AM  Result Value Ref Range   Hgb A1c MFr Bld 5.9 4.6 - 6.5 %    Comment: Glycemic Control Guidelines for People with Diabetes:Non Diabetic:  <6%Goal of Therapy: <7%Additional Action Suggested:  >8%      Psychiatric Specialty Exam: Physical Exam  Review of Systems  Weight 267 lb (121.1 kg).There is no height or weight on file to calculate BMI.  General Appearance: NA  Eye Contact:  NA  Speech:  Clear and Coherent and Slow  Volume:  Normal  Mood:  Euthymic  Affect:  Appropriate  Thought Process:  Goal Directed  Orientation:  Full (Time, Place, and Person)  Thought Content:  Logical  Suicidal Thoughts:  No  Homicidal Thoughts:  No  Memory:  Immediate;   Fair Recent;   Fair Remote;   Fair  Judgement:  Intact  Insight:  Present  Psychomotor Activity:  Normal  Concentration:  Concentration: Fair and Attention Span: Fair  Recall:  [employer] of Knowledge:  Good  Language:  Good  Akathisia:  No  Handed:  Right  AIMS (if indicated):  Assets:  Communication Skills Desire for Improvement Housing Social Support  ADL's:  Intact  Cognition:  WNL  Sleep:   ok      Assessment and Plan: Bipolar disorder type I.  Generalized anxiety disorder.  I reviewed blood work results.  Hemoglobin A1c further improved.  He lost 3 pounds since the last visit.  He is not taking Cogentin and feels more motivated.  Continue temazepam 15 mg at bedtime, Celexa 20 mg daily and Abilify injection 400 mg intramuscular every 28 days.  Recommended to call us back if is any question or any concern.  Follow-up in 3 months.  Like [facility].  Follow Up Instructions:    I  discussed the assessment and treatment plan with the patient. The patient was provided an opportunity to ask questions and all were answered. The patient agreed with the plan and demonstrated an understanding of the instructions.   The patient was advised to call back or seek an in-person evaluation if the symptoms worsen or if the condition fails to improve as anticipated.  Collaboration of Care: Other provider involved in patient's care AEB notes are available in epic to review.  Patient/Guardian was advised Release of Information must be obtained prior to any record release in order to collaborate their care with an outside provider. Patient/Guardian was advised if they have not already done so to contact the registration department to sign all necessary forms in order for [place] to release information regarding their care.   Consent: Patient/Guardian gives verbal consent for treatment and assignment of benefits for services provided during this visit. Patient/Guardian expressed understanding and agreed to proceed.    I provided 19 minutes of non-face-to-face time during this encounter.   [name], MD

## 2022-11-01 ENCOUNTER — Other Ambulatory Visit: Payer: Self-pay | Admitting: Adult Health

## 2022-11-01 ENCOUNTER — Other Ambulatory Visit (HOSPITAL_COMMUNITY): Payer: Self-pay | Admitting: Psychiatry

## 2022-11-01 DIAGNOSIS — F411 Generalized anxiety disorder: Secondary | ICD-10-CM

## 2022-11-01 DIAGNOSIS — F319 Bipolar disorder, unspecified: Secondary | ICD-10-CM

## 2022-11-02 ENCOUNTER — Telehealth (HOSPITAL_COMMUNITY): Payer: Self-pay | Admitting: *Deleted

## 2022-11-02 NOTE — Telephone Encounter (Signed)
Writer received VM from [facility] requesting that the Celexa and [facility]il [facility] Writer spoke to pt to confirm this was correct. [facility] and will [facility] to do so. FYI.

## 2022-11-07 NOTE — Progress Notes (Cosign Needed)
Patient ID: Sean Hill, male   DOB: 1941-07-28, 82 y.o.   MRN: [id number]    Call to [facility] to follow up on status of scripts not received for patients delivery on tomorrow, was advised that they were still waiting on approval from the Dr. and that they had notice that the [facility]. Advised I spoke to patient's wife and she requested otherwise. Call to patient to advise that we will not be able to include these medications in his delivery as office is unwilling to send prescriptions to [place] for these medications and [facility] or call [facility] and request the [facility] if he likes. [facility] Pharmacy advised    [place] Pharmacist Assistant [phone]

## 2022-11-17 ENCOUNTER — Ambulatory Visit (HOSPITAL_BASED_OUTPATIENT_CLINIC_OR_DEPARTMENT_OTHER): Payer: PPO | Admitting: *Deleted

## 2022-11-17 VITALS — BP 146/76 | HR 68 | Temp 98.3°F | Resp 20 | Ht 68.0 in | Wt 271.6 lb

## 2022-11-17 DIAGNOSIS — F319 Bipolar disorder, unspecified: Secondary | ICD-10-CM | POA: Diagnosis not present

## 2022-11-17 NOTE — Patient Instructions (Addendum)
Pt in clinic today for due Abilify Maintena 400 mg injection. Pt appropriate, pleasant and cooperative on approach. Pt states that he woke up tired this morning but otherwise has had no issues since last injection. Denies SI, AVH, or Hi. Injection prepared as ordered and given in LUOQ without complaint. Pt advised to call with any questions or concerns. Ronalee Belts will return in approximately 28 days for next due injection.

## 2022-12-07 DIAGNOSIS — H0102A Squamous blepharitis right eye, upper and lower eyelids: Secondary | ICD-10-CM | POA: Diagnosis not present

## 2022-12-07 DIAGNOSIS — Z961 Presence of intraocular lens: Secondary | ICD-10-CM | POA: Diagnosis not present

## 2022-12-07 DIAGNOSIS — H0102B Squamous blepharitis left eye, upper and lower eyelids: Secondary | ICD-10-CM | POA: Diagnosis not present

## 2022-12-15 ENCOUNTER — Ambulatory Visit (HOSPITAL_BASED_OUTPATIENT_CLINIC_OR_DEPARTMENT_OTHER): Payer: PPO | Admitting: *Deleted

## 2022-12-15 VITALS — BP 154/72 | HR 86 | Resp 20 | Ht 68.0 in | Wt 265.2 lb

## 2022-12-15 DIAGNOSIS — F319 Bipolar disorder, unspecified: Secondary | ICD-10-CM

## 2022-12-15 NOTE — Patient Instructions (Signed)
Pt presents to office today for due Abilify Maintena 400 mg injection. Pt is stressed and worried about his wife's ongoing medical issues but denies any SI, Hi,or AVH. No major lability noted. Injection prepared as ordered and given in RUOQ without complaint. Pt to return in approximately 28 days fr next due injection. Pt agrees to call with any questions or concerns.

## 2022-12-19 ENCOUNTER — Telehealth (HOSPITAL_COMMUNITY): Payer: Self-pay | Admitting: *Deleted

## 2022-12-19 ENCOUNTER — Other Ambulatory Visit (HOSPITAL_COMMUNITY): Payer: Self-pay | Admitting: *Deleted

## 2022-12-19 MED ORDER — GABAPENTIN 100 MG PO CAPS: ORAL_CAPSULE | ORAL | 1 refills | Status: DC

## 2022-12-19 NOTE — Telephone Encounter (Signed)
Writer spoke with pt who called to request "something for anxiety". Pt says that his anxiety r/t his wife's possible recurrence of cancer is to much to bare. Pt did verbalized increased when in for nurse visit last week. Pt currently taking Temazepam 15 mg QHS but says it helps with sleep but not daytime anxiety. Pt does not remember what he has tried previously but looks like he has tried klonopin 1 mg BID in the past (2012) from Dr. Salem Senate.  Pt next scheduled appointment with you is scheduled for 01/26/23. Please review and advise.

## 2022-12-19 NOTE — Telephone Encounter (Signed)
He can take gabapentin 100 mg up to 2 times a day.  If agree please call prescription to his pharmacy.

## 2022-12-20 NOTE — Telephone Encounter (Signed)
Pt agreed[facility] [address]

## 2022-12-28 DIAGNOSIS — H353232 Exudative age-related macular degeneration, bilateral, with inactive choroidal neovascularization: Secondary | ICD-10-CM | POA: Diagnosis not present

## 2022-12-28 DIAGNOSIS — H43813 Vitreous degeneration, bilateral: Secondary | ICD-10-CM | POA: Diagnosis not present

## 2023-01-03 ENCOUNTER — Telehealth: Payer: Self-pay

## 2023-01-03 ENCOUNTER — Ambulatory Visit (INDEPENDENT_AMBULATORY_CARE_PROVIDER_SITE_OTHER): Payer: PPO | Admitting: Adult Health

## 2023-01-03 ENCOUNTER — Encounter: Payer: Self-pay | Admitting: Adult Health

## 2023-01-03 VITALS — BP 110/60 | HR 70 | Temp 98.0°F | Ht 68.0 in | Wt 265.0 lb

## 2023-01-03 DIAGNOSIS — J0141 Acute recurrent pansinusitis: Secondary | ICD-10-CM

## 2023-01-03 MED ORDER — DOXYCYCLINE HYCLATE 100 MG PO CAPS
100.0000 mg | ORAL_CAPSULE | Freq: Two times a day (BID) | ORAL | 0 refills | Status: DC
Start: 2023-01-03 — End: 2023-01-12

## 2023-01-03 NOTE — Progress Notes (Unsigned)
Care Management & Coordination Services Pharmacy Note  01/03/2023 Name:  Sean Hill MRN:  [id number] DOB:  04-21-1941  Summary: BP at goal <130/80 LDL not at goal <100, self-stopped pravastatin due to joint pain  Recommendations/Changes made from today's visit: -Counseled to be mindful of sodium intake in diet and to check BP once weekly and keep a log -Counseled to take pravastatin at night instead of the morning to help with joint pain and efficacy  Follow up plan: HLD review in 3 weeks Pharmacist visit in 8 months   Subjective: Sean Hill is an 82 y.o. year old male who is a primary patient of Shirline Frees, NP.  The care coordination team was consulted for assistance with disease management and care coordination needs.    Engaged with patient by telephone for follow up visit.  Recent office visits: 01/03/23 Shirline Frees, NP - Acute recurrent pansinusitis - START doxycyline  BID  Recent consult visits: 12/15/22 Harvel Quale, LPN - For Bipolar Disorder, Abilify  administered, START Gabapentin  BID (abilify also given 2/29 and 1/30)  10/28/22 Cleotis Nipper, MD - For GAD, no med changes  09/26/22 Tillie Rung, LPN - Patient presented for Medicare Annual Wellness Exam. No medication changes   Hospital visits: None in previous 6 months   Objective:  Lab Results  Component Value Date   CREATININE 1.03 08/16/2022   BUN 15 08/16/2022   GFR 69.00 08/16/2022   EGFR 66 05/26/2022   GFRNONAA >60 08/26/2021   GFRAA >60 10/09/2019   NA 139 08/16/2022   K 3.9 08/16/2022   CALCIUM 9.6 08/16/2022   CO2 26 08/16/2022   GLUCOSE 119 (H) 08/16/2022    Lab Results  Component Value Date/Time   HGBA1C 5.9 08/16/2022 11:48 AM   HGBA1C 6.2 05/05/2022 10:07 AM   GFR 69.00 08/16/2022 11:48 AM   GFR 57.56 (L) 05/05/2022 10:07 AM   MICROALBUR 1.5 11/28/2011 04:28 PM    Last diabetic Eye exam: No results found for: "HMDIABEYEEXA"  Last diabetic  Foot exam: No results found for: "HMDIABFOOTEX"   Lab Results  Component Value Date   CHOL 173 05/05/2022   HDL 39.20 05/05/2022   LDLDIRECT 106.0 05/05/2022   TRIG 218.0 (H) 05/05/2022   CHOLHDL 4 05/05/2022       Latest Ref Rng & Units 05/05/2022   10:07 AM 08/16/2021    9:40 AM 04/29/2021   11:06 AM  Hepatic Function  Total Protein 6.0 - 8.3 g/dL 6.7  6.8  6.6   Albumin 3.5 - 5.2 g/dL 4.5  4.1  4.1   AST 0 - 37 U/L 25  43  35   ALT 0 - 53 U/L Alk Phosphatase 39 - 117 U/L 64  71  91   Total Bilirubin 0.2 - 1.2 mg/dL 0.8  0.8  0.7     Lab Results  Component Value Date/Time   TSH 1.90 04/29/2021 11:06 AM   TSH 2.30 12/25/2019 08:23 AM   FREET4 0.89 07/21/2011 06:52 PM       Latest Ref Rng & Units 05/05/2022   10:07 AM 08/26/2021    3:37 AM 08/16/2021    9:40 AM  CBC  WBC 4.0 - 10.5 K/uL 6.5  12.7  4.4   Hemoglobin 13.0 - 17.0 g/dL 40.9  81.1  91.4   Hematocrit 39.0 - 52.0 % 46.6  41.2  44.4   Platelets 150.0 - 400.0 K/uL 222.0  219  233     Lab Results  Component Value Date/Time   VITAMINB12 256 12/27/2012 02:26 PM   VITAMINB12 433 07/21/2011 06:52 PM    Clinical ASCVD: No  The ASCVD Risk score (Arnett DK, et al., 2019) failed to calculate for the following reasons:   The 2019 ASCVD risk score is only valid for ages 11 to 21        01/03/2023    8:40 AM 09/26/2022    2:12 PM 07/29/2022    1:44 PM  Depression screen PHQ 2/9  Decreased Interest 2 0 0  Down, Depressed, Hopeless 1 0 0  PHQ - 2 Score 3 0 0  Altered sleeping 0    Tired, decreased energy 3    Change in appetite 2    Feeling bad or failure about yourself  0    Trouble concentrating 0    Moving slowly or fidgety/restless 0    Suicidal thoughts 0    PHQ-9 Score 8    Difficult doing work/chores Not difficult at all       Social History   Tobacco Use  Smoking Status Never  Smokeless Tobacco Never   BP Readings from Last 3 Encounters:  01/03/23 110/60  12/15/22 (!) 154/72   11/17/22 (!) 146/76   Pulse Readings from Last 3 Encounters:  01/03/23 70  12/15/22 86  11/17/22 68   Wt Readings from Last 3 Encounters:  01/03/23 265 lb (120.2 kg)  12/15/22 265 lb 3.2 oz (120.3 kg)  11/17/22 271 lb 9.6 oz (123.2 kg)   BMI Readings from Last 3 Encounters:  01/03/23 40.29 kg/m  12/15/22 40.32 kg/m  11/17/22 41.30 kg/m    Allergies  Allergen Reactions   Penicillins Other (See Comments)    BLISTERS Did it involve swelling of the face/tongue/throat, SOB, or low BP? No Did it involve sudden or severe rash/hives, skin peeling, or any reaction on the inside of your mouth or nose? No Did you need to seek medical attention at a hospital or doctor's office? No When did it last happen? around 1980    If all above answers are "NO", may proceed with cephalosporin use. Tolerated Cephalosporin Date: 08/26/21.     Lisinopril Diarrhea and Cough    Dry cough, abdominal bloating and diarrhea   Zetia [Ezetimibe]     Myalgia    Codeine Rash    Medications Reviewed Today     Reviewed by Sherrin Daisy, CMA (Certified Medical Assistant) on 01/03/23 at [phone]  Med List Status: <None>   Medication Order Taking? Sig Documenting Provider Last Dose Status Informant  albuterol (VENTOLIN HFA) 108 (90 Base) MCG/ACT inhaler [id number] Yes Inhale 2 puffs into the lungs every 6 (six) hours as needed for wheezing or shortness of breath. Nafziger, Kandee Keen, NP Taking Active Self  ARIPiprazole ER (ABILIFY MAINTENA) 400 MG prefilled syringe 400 mg [id number]    Patient taking differently: Inject 400 mg into the muscle every 28 (twenty-eight) days. Reconstitute with the attached diluent before use.   Cleotis Nipper, MD  Active   ARIPiprazole ER (ABILIFY MAINTENA) 400 MG PRSY prefilled syringe [id number] Yes Inject 400 mg into the muscle every 28 (twenty-eight) days. Cleotis Nipper, MD Taking Active   azelastine (ASTELIN) 0.1 % nasal spray [id number] Yes Place 2 sprays into both nostrils 2  (two) times daily. Use in each nostril as directed Shirline Frees, NP Taking Active Self  cetirizine (ZYRTEC) 10 MG tablet [id number] Yes Take 10 mg by  mouth every morning. [provider] Taking Active Self  citalopram (CELEXA) 20 MG tablet [id number] Yes Take 1 tablet (20 mg total) by mouth daily. Cleotis Nipper, MD Taking Active   Continuous Blood Gluc Receiver (FREESTYLE LIBRE 2 READER) Hardie Pulley [id number] Yes Use with libre sensors Nafziger, Kandee Keen, NP Taking Active   Continuous Blood Gluc Sensor (FREESTYLE LIBRE 2 SENSOR) [place] [id number] Yes 1 Device by Does not apply route every 14 (fourteen) days. Nafziger, Kandee Keen, NP Taking Active   fenofibrate (TRICOR) 145 MG tablet [id number] Yes TAKE ONE TABLET BY MOUTH ONCE DAILY Nafziger, Kandee Keen, NP Taking Active Self  gabapentin (NEURONTIN) 100 MG capsule [id number] Yes Take one capsule (100 mg total) by mouth twice daily. Cleotis Nipper, MD Taking Active   ipratropium-albuterol (DUONEB) 0.5-2.5 (3) MG/3ML SOLN [id number] Yes Take 3 mLs by nebulization every 6 (six) hours as needed.  Patient taking differently: Take 3 mLs by nebulization every 6 (six) hours as needed (shortness of breath).   Nafziger, Kandee Keen, NP Taking Active Self  losartan (COZAAR) 50 MG tablet [id number] Yes TAKE ONE TABLET BY MOUTH EVERY MORNING Nafziger, Kandee Keen, NP Taking Active Self  metFORMIN (GLUCOPHAGE) 500 MG tablet [id number] Yes TAKE ONE TABLET BY MOUTH ONCE DAILY Nafziger, Kandee Keen, NP Taking Active   metoprolol tartrate (LOPRESSOR) 100 MG tablet [id number] Yes Take 1 tablet by mouth once for procedure. Rollene Rotunda, MD Taking Active Self  Multiple Vitamin (MULTIVITAMIN WITH MINERALS) TABS tablet [id number] Yes Take 1 tablet by mouth daily. 50 + [provider] Taking Active Self  Multiple Vitamins-Minerals (PRESERVISION AREDS 2 PO) [id number] Yes Take 1 capsule by mouth 2 (two) times daily. [provider] Taking Active Self  naproxen sodium (ALEVE) 220 MG tablet [id number]  Yes Take 220 mg by mouth daily as needed (pain). [provider] Taking Active Self  pantoprazole (PROTONIX) 40 MG tablet [id number] Yes TAKE ONE TABLET BY MOUTH TWICE DAILY Cirigliano, Vito V, DO Taking Active   pravastatin (PRAVACHOL) 10 MG tablet [id number] Yes TAKE ONE TABLET BY MOUTH every other DAY Nafziger, Kandee Keen, NP Taking Active   temazepam (RESTORIL) 15 MG capsule [id number] Yes Take 1 capsule (15 mg total) by mouth at bedtime. Cleotis Nipper, MD Taking Active   VASCEPA 1 g capsule [id number] Yes TAKE TWO CAPSULES BY MOUTH TWICE DAILY Nafziger, Kandee Keen, NP Taking Active   Vitamin D, Cholecalciferol, 1000 UNITS TABS [id number] Yes Take 1,000 Units by mouth daily.  [provider] Taking Active Self            SDOH:  (Social Determinants of Health) assessments and interventions performed: Yes SDOH Interventions    Flowsheet Row Clinical Support from 09/26/2022 in [facility] [place] Chronic Care Management from 08/05/2022 in [facility] [place] Telephone from 07/29/2022 in [facility] Community Care Coordination Clinical Support from 09/25/2020 in [facility] [place] Chronic Care Management from 04/22/2020 in [facility] [place] Office Visit from 01/02/2015 in [facility] [place]  SDOH Interventions        Food Insecurity Interventions Intervention Not Indicated -- Intervention Not Indicated Intervention Not Indicated -- --  Housing Interventions Intervention Not Indicated -- Intervention Not Indicated Intervention Not Indicated -- --  Transportation Interventions Intervention Not Indicated -- Intervention Not Indicated Intervention Not Indicated Intervention Not Indicated --  Utilities Interventions Intervention Not Indicated -- Intervention Not Indicated -- -- --  Alcohol Usage Interventions Intervention Not Indicated (Score <7) -- -- -- -- --  Depression Interventions/Treatment  -- -- -- -- -- Medication  Financial Strain Interventions Intervention Not Indicated Intervention Not Indicated -- Intervention Not Indicated Other (Comment)  [Formulary review for medication cost. Patient reported ezetimibe $180-200/ 3 months supply.] --  Physical Activity Interventions Intervention Not Indicated -- -- Other (Comments)  [Recommended patient increase physical activity] -- --  Stress Interventions Intervention Not Indicated -- -- Intervention Not Indicated -- --  Social Connections Interventions Intervention Not Indicated -- -- Intervention Not Indicated -- --       Medication Assistance: None required.  Patient affirms current coverage meets needs.  Medication Access: Within the past 30 days, how often has patient missed a dose of medication? None Is a pillbox or other method used to improve adherence? Yes  Factors that may affect medication adherence? no barriers identified Are meds synced by current pharmacy? Yes  Are meds delivered by current pharmacy? Yes  Does patient experience delays in picking up medications due to transportation concerns? No   [facility] Reviewed: Is patient disadvantaged to use [facility]?: No  Current Rx insurance plan: HTA Name and location of Current pharmacy:  [id number] [place], [place] - [address] [place] [address] Phone: [phone] Fax: [phone]  [facility] - [place], [place] - [address] [place] [address] Phone: [phone] Fax: [phone]  [facility] services reviewed with patient today?: No  Patient requests to transfer care to [facility]?: No  Reason patient declined to change pharmacies: Patient is already actively enrolled with [facility]  Compliance/Adherence/Medication fill history: Care Gaps: COVID Booster - Overdue AWV -  09/26/22  [facility]: Losartan 50 mg - Last filled 11/08/22 90 Ds at [facility]    Assessment/Plan   Hypertension (BP goal <130/80) -Controlled -Current treatment: Losartan 50mg  1 qd -Medications previously tried: Lisinopril  -Current home readings: not checking at home -Current dietary habits: mindful of salt intake -Current exercise habits: limited currently due to feeling poorly (on antibiotic for respiratory infxn) -Denies hypotensive/hypertensive symptoms -Educated on BP goals and benefits of medications for prevention of heart attack, stroke and kidney damage; Daily salt intake goal < 2300 mg; Exercise goal of 150 minutes per week; Importance of home blood pressure monitoring; Proper BP monitoring technique; -Counseled to monitor BP at home once weekly, document, and provide log at future appointments -Recommended to continue current medication  Hyperlipidemia: (LDL goal < 100, TG <150) -Uncontrolled -Current treatment: Fenofibrate 145mg  1 qd Appropriate, Effective, Safe, Accessible Pravastatin 10mg  1 qod (has stopped due to joint pains) -Medications previously tried: statins, zetia  -Current dietary patterns: not discussed -Current exercise habits: see above -Educated on Cholesterol goals;  Benefits of statin for ASCVD risk reduction; Importance of limiting foods high in cholesterol; Exercise goal of 150 minutes per week; Strategies to manage statin-induced myalgias; -Recommended to restart pravastatin, taking at night to help with joint pain  Sherrill Raring Clinical Pharmacist [phone]

## 2023-01-03 NOTE — Progress Notes (Signed)
Subjective:    Patient ID: Sean Hill, male    DOB: Nov 12, 1940, 82 y.o.   MRN: [id number]  HPI  82 year old male who  has a past medical history of Anal fissure, Anxiety, Arthritis, Asthma, Bipolar disorder, Cataract, Depression, Difficult airway for intubation, GERD (gastroesophageal reflux disease), Hemorrhage of colon following colonoscopy (10/08/2019), History of kidney stones, History of MRSA infection, Hyperlipidemia, Hypertension, Impaired hearing, Left bundle branch block (LBBB) on electrocardiogram (10/29/2015), Macular degeneration (senile) of retina, and Pre-diabetes.  He presents to the office today for an acute issue. He reports that his symptoms started about 5  days ago. He has been progressively worse as the week has gone on. He reports sinus pain and pressure., discolored nasal drainage, headache, fatigued and mostly dry cough. . He denies fevers or chills.  At home he has been using Zyrtec and Astelin nasal spray       Review of Systems See HPI   Past Medical History:  Diagnosis Date   Anal fissure    Anxiety    Arthritis    right wrist; pt. states "everywhere"   Asthma    Bipolar disorder    Cataract    Depression    Difficult airway for intubation    GERD (gastroesophageal reflux disease)    Hemorrhage of colon following colonoscopy 10/08/2019   History of kidney stones    History of MRSA infection    left knee after arthroplasty   Hyperlipidemia    Hypertension    states under control with med., has been on med. x 1 yr.   Impaired hearing    Left bundle branch block (LBBB) on electrocardiogram 10/29/2015   Macular degeneration (senile) of retina    left   Pre-diabetes     Social History   Socioeconomic History   Marital status: Married    Spouse name: Not on file   Number of children: Not on file   Years of education: Not on file   Highest education level: 12th grade  Occupational History   Not on file  Tobacco Use   Smoking  status: Never   Smokeless tobacco: Never  Vaping Use   Vaping Use: Never used  Substance and Sexual Activity   Alcohol use: Not Currently    Comment: occasional.   Drug use: No   Sexual activity: Not Currently    Birth control/protection: None  Other Topics Concern   Not on file  Social History Narrative   Married, lives in [place].  Retired Quarry manager.   Two daughters, both married,3 grandchildren    No regular exercise.  Never smoker.  No ETOH.  No drugs.   Social Determinants of Health   Financial Resource Strain: Low Risk  (01/02/2023)   Overall Financial Resource Strain (CARDIA)    Difficulty of Paying Living Expenses: Not hard at all  Food Insecurity: No Food Insecurity (01/02/2023)   Hunger Vital Sign    Worried About Running Out of Food in the Last Year: Never true    Ran Out of Food in the Last Year: Never true  Transportation Needs: No Transportation Needs (01/02/2023)   PRAPARE - Administrator, Civil Service (Medical): No    Lack of Transportation (Non-Medical): No  Physical Activity: Inactive (01/02/2023)   Exercise Vital Sign    Days of Exercise per Week: 0 days    Minutes of Exercise per Session: 0 min  Stress: Stress Concern Present (01/02/2023)   [employer] of  Occupational Health - Occupational Stress Questionnaire    Feeling of Stress : Rather much  Social Connections: Moderately Integrated (01/02/2023)   Social Connection and Isolation Panel [NHANES]    Frequency of Communication with Friends and Family: More than three times a week    Frequency of Social Gatherings with Friends and Family: Three times a week    Attends Religious Services: Never    Active Member of Clubs or Organizations: No    Attends Banker Meetings: More than 4 times per year    Marital Status: Married  Catering manager Violence: Not At Risk (09/26/2022)   Humiliation, Afraid, Rape, and Kick questionnaire    Fear of Current or Ex-Partner: No     Emotionally Abused: No    Physically Abused: No    Sexually Abused: No    Past Surgical History:  Procedure Laterality Date   ANKLE FUSION Bilateral    ANKLE SURGERY Bilateral    ligament surgery   BIOPSY  04/29/2020   Procedure: BIOPSY;  Surgeon: Shellia Cleverly, DO;  Location: [facility];  Service: Gastroenterology;;   BIOPSY  07/14/2022   Procedure: BIOPSY;  Surgeon: Shellia Cleverly, DO;  Location: [facility];  Service: Gastroenterology;;   CARDIAC CATHETERIZATION  x 2   1983; 11/14/2003   CARPOMETACARPEL SUSPENSION PLASTY Right 09/22/2016   Procedure: right thumb carpometacarpal  ARTHROPLASTY;  Surgeon: Betha Loa, MD;  Location: [facility];  Service: Orthopedics;  Laterality: Right;  right thumb carpometacarpal  ARTHROPLASTY   CARPOMETACARPEL SUSPENSION PLASTY Right 11/02/2017   Procedure: RIGHT THUMB SUSPENSIONPLASTY WITH TIGHTROPE, DISTAL POLE SCAPHOID  EXCISION;  Surgeon: Betha Loa, MD;  Location:  SURGERY CENTER;  Service: Orthopedics;  Laterality: Right;   COLONOSCOPY WITH PROPOFOL N/A 10/02/2019   Procedure: COLONOSCOPY WITH PROPOFOL;  Surgeon: Shellia Cleverly, DO;  Location: [facility];  Service: Gastroenterology;  Laterality: N/A;   COLONOSCOPY WITH PROPOFOL N/A 10/09/2019   Procedure: COLONOSCOPY WITH PROPOFOL;  Surgeon: Iva Boop, MD;  Location: [facility];  Service: Endoscopy;  Laterality: N/A;   COLONOSCOPY WITH PROPOFOL N/A 04/29/2020   Procedure: COLONOSCOPY WITH PROPOFOL;  Surgeon: Shellia Cleverly, DO;  Location: [facility];  Service: Gastroenterology;  Laterality: N/A;   COLONOSCOPY WITH PROPOFOL N/A 11/11/2021   Procedure: COLONOSCOPY WITH PROPOFOL;  Surgeon: Shellia Cleverly, DO;  Location: [facility];  Service: Gastroenterology;  Laterality: N/A;   ELBOW SURGERY Left    ENDOSCOPIC MUCOSAL RESECTION N/A 10/02/2019   Procedure: ENDOSCOPIC MUCOSAL RESECTION;  Surgeon: Shellia Cleverly, DO;  Location: [facility];  Service: Gastroenterology;  Laterality: N/A;   ESOPHAGOGASTRODUODENOSCOPY (EGD) WITH PROPOFOL N/A 07/14/2022   Procedure: ESOPHAGOGASTRODUODENOSCOPY (EGD) WITH PROPOFOL;  Surgeon: Shellia Cleverly, DO;  Location: [facility];  Service: Gastroenterology;  Laterality: N/A;   HEMOSTASIS CLIP PLACEMENT  10/09/2019   Procedure: HEMOSTASIS CLIP PLACEMENT;  Surgeon: Iva Boop, MD;  Location: [facility];  Service: Endoscopy;;   HEMOSTASIS CLIP PLACEMENT  11/11/2021   Procedure: HEMOSTASIS CLIP PLACEMENT;  Surgeon: Shellia Cleverly, DO;  Location: [facility];  Service: Gastroenterology;;   HEMOSTASIS CLIP PLACEMENT  07/14/2022   Procedure: HEMOSTASIS CLIP PLACEMENT;  Surgeon: Shellia Cleverly, DO;  Location: [facility];  Service: Gastroenterology;;   INGUINAL HERNIA REPAIR Right    KNEE SURGERY     OPEN REDUCTION INTERNAL FIXATION (ORIF) DISTAL RADIAL FRACTURE Right 10/29/2015   Procedure: OPEN REDUCTION INTERNAL FIXATION (ORIF) DISTAL RADIAL FRACTURE;  Surgeon: Betha Loa, MD;  Location: [facility];  Service: Orthopedics;  Laterality: Right;   ORIF ELBOW FRACTURE Right    POLYPECTOMY  04/29/2020   Procedure: POLYPECTOMY;  Surgeon: Shellia Cleverly, DO;  Location: [facility];  Service: Gastroenterology;;   POLYPECTOMY  11/11/2021   Procedure: POLYPECTOMY;  Surgeon: Shellia Cleverly, DO;  Location: [facility];  Service: Gastroenterology;;   POLYPECTOMY  07/14/2022   Procedure: POLYPECTOMY;  Surgeon: Shellia Cleverly, DO;  Location: [facility];  Service: Gastroenterology;;   Gaspar Bidding DILATION N/A 07/14/2022   Procedure: SAVORY DILATION;  Surgeon: Shellia Cleverly, DO;  Location: [facility];  Service: Gastroenterology;  Laterality: N/A;   SUBMUCOSAL LIFTING INJECTION  10/02/2019   Procedure: SUBMUCOSAL LIFTING INJECTION;  Surgeon: Shellia Cleverly, DO;  Location: [facility];  Service: Gastroenterology;;   TONSILLECTOMY     TOTAL KNEE  ARTHROPLASTY Bilateral    TOTAL KNEE REVISION Right 08/25/2021   Procedure: Right knee polyethylene vs total knee arthroplasty revision;  Surgeon: Ollen Gross, MD;  Location: [facility];  Service: Orthopedics;  Laterality: Right;   ULNAR COLLATERAL LIGAMENT REPAIR Right 12/29/2015   Procedure: REPAIR  RIGHT LATERAL ULNAR COLLATERAL LIGAMENT TEAR  EXTENSOR ORIGIN ;  Surgeon: Betha Loa, MD;  Location: [facility];  Service: Orthopedics;  Laterality: Right;   WRIST ARTHROSCOPY WITH DEBRIDEMENT Right 07/14/2016   Procedure: RIGHT WRIST ARTHROSCOPY WITH DEBRIDEMENT  TRIANGULAR FIBROCARTILAGE COMPLEX;  Surgeon: Betha Loa, MD;  Location: [facility];  Service: Orthopedics;  Laterality: Right;    Family History  Problem Relation Age of Onset   Alcohol abuse Mother    Ovarian cancer Mother    Alcohol abuse Father    Pancreatic cancer Father    Hyperlipidemia Brother    Barrett's esophagus Brother    Colon cancer Maternal Grandmother    Depression Daughter    Paranoid behavior Daughter    Esophageal cancer Neg Hx    Stomach cancer Neg Hx    Rectal cancer Neg Hx    Colon polyps Neg Hx     Allergies  Allergen Reactions   Penicillins Other (See Comments)    BLISTERS Did it involve swelling of the face/tongue/throat, SOB, or low BP? No Did it involve sudden or severe rash/hives, skin peeling, or any reaction on the inside of your mouth or nose? No Did you need to seek medical attention at a hospital or doctor's office? No When did it last happen? around 1980    If all above answers are "NO", may proceed with cephalosporin use. Tolerated Cephalosporin Date: 08/26/21.     Lisinopril Diarrhea and Cough    Dry cough, abdominal bloating and diarrhea   Zetia [Ezetimibe]     Myalgia    Codeine Rash    Current Outpatient Medications on File Prior to Visit  Medication Sig Dispense Refill   albuterol (VENTOLIN HFA) 108 (90 Base) MCG/ACT inhaler Inhale 2 puffs  into the lungs every 6 (six) hours as needed for wheezing or shortness of breath. 6.7 g 3   ARIPiprazole ER (ABILIFY MAINTENA) 400 MG PRSY prefilled syringe Inject 400 mg into the muscle every 28 (twenty-eight) days. 1 each 2   azelastine (ASTELIN) 0.1 % nasal spray Place 2 sprays into both nostrils 2 (two) times daily. Use in each nostril as directed 30 mL 6   cetirizine (ZYRTEC) 10 MG tablet Take 10 mg by mouth every morning.     citalopram (CELEXA) 20 MG tablet Take 1 tablet (20 mg total)  by mouth daily. 90 tablet 0   Continuous Blood Gluc Receiver (FREESTYLE LIBRE 2 READER) DEVI Use with libre sensors 1 each 0   Continuous Blood Gluc Sensor (FREESTYLE LIBRE 2 SENSOR) MISC 1 Device by Does not apply route every 14 (fourteen) days. 2 each 11   fenofibrate (TRICOR) 145 MG tablet TAKE ONE TABLET BY MOUTH ONCE DAILY 90 tablet 3   gabapentin (NEURONTIN) 100 MG capsule Take one capsule (100 mg total) by mouth twice daily. 60 capsule 1   ipratropium-albuterol (DUONEB) 0.5-2.5 (3) MG/3ML SOLN Take 3 mLs by nebulization every 6 (six) hours as needed. (Patient taking differently: Take 3 mLs by nebulization every 6 (six) hours as needed (shortness of breath).) 360 mL 0   losartan (COZAAR) 50 MG tablet TAKE ONE TABLET BY MOUTH EVERY MORNING 90 tablet 3   metFORMIN (GLUCOPHAGE) 500 MG tablet TAKE ONE TABLET BY MOUTH ONCE DAILY 90 tablet 0   metoprolol tartrate (LOPRESSOR) 100 MG tablet Take 1 tablet by mouth once for procedure. 1 tablet 0   Multiple Vitamin (MULTIVITAMIN WITH MINERALS) TABS tablet Take 1 tablet by mouth daily. 50 +     Multiple Vitamins-Minerals (PRESERVISION AREDS 2 PO) Take 1 capsule by mouth 2 (two) times daily.     naproxen sodium (ALEVE) 220 MG tablet Take 220 mg by mouth daily as needed (pain).     pantoprazole (PROTONIX) 40 MG tablet TAKE ONE TABLET BY MOUTH TWICE DAILY 180 tablet 1   pravastatin (PRAVACHOL) 10 MG tablet TAKE ONE TABLET BY MOUTH every other DAY 45 tablet 3   temazepam  (RESTORIL) 15 MG capsule Take 1 capsule (15 mg total) by mouth at bedtime. 90 capsule 0   VASCEPA 1 g capsule TAKE TWO CAPSULES BY MOUTH TWICE DAILY 360 capsule 3   Vitamin D, Cholecalciferol, 1000 UNITS TABS Take 1,000 Units by mouth daily.      Current Facility-Administered Medications on File Prior to Visit  Medication Dose Route Frequency Provider Last Rate Last Admin   ARIPiprazole ER (ABILIFY MAINTENA) 400 MG prefilled syringe 400 mg  400 mg Intramuscular Q28 days Arfeen, Phillips Grout, MD   400 mg at 12/15/22 1132    BP 110/60   Pulse 70   Temp 98 F (36.7 C) (Oral)   Ht 5\' 8"  (1.727 m)   Wt 265 lb (120.2 kg)   SpO2 94%   BMI 40.29 kg/m       Objective:   Physical Exam Constitutional:      Appearance: Normal appearance.  HENT:     Nose:     Right Turbinates: Enlarged and swollen.     Left Turbinates: Enlarged and swollen.     Right Sinus: Maxillary sinus tenderness and frontal sinus tenderness present.     Left Sinus: Maxillary sinus tenderness and frontal sinus tenderness present.  Cardiovascular:     Rate and Rhythm: Normal rate and regular rhythm.     Pulses: Normal pulses.     Heart sounds: Normal heart sounds.  Pulmonary:     Effort: Pulmonary effort is normal.     Breath sounds: Normal breath sounds.  Skin:    General: Skin is warm and dry.  Neurological:     General: No focal deficit present.     Mental Status: He is alert and oriented to person, place, and time.  Psychiatric:        Mood and Affect: Mood normal.        Behavior: Behavior normal.  Thought Content: Thought content normal.       Assessment & Plan:  1. Acute recurrent pansinusitis - Advised using normal saline nasal rinses  - Will treat with Doxycycline  - doxycycline (VIBRAMYCIN) 100 MG capsule; Take 1 capsule (100 mg total) by mouth 2 (two) times daily.  Dispense: 14 capsule; Refill: 0  Shirline Frees, NP

## 2023-01-03 NOTE — Progress Notes (Signed)
Patient ID: Sean Hill, male   DOB: July 10, 1941, 82 y.o.   MRN: [id number]  Care Management & Coordination Services Pharmacy Team  Reason for Encounter: Appointment Reminder  Contacted patient to confirm telephone appointment with Sean Hill, PharmD on 01/04/23 at 1:45. Spoke with patient on 01/03/2023      Star Rating Drugs:  Losartan 50 mg - Last filled 11/08/22 90 Ds at [facility]   Care Gaps: COVID Booster - Overdue AWV - 09/26/22   Pamala Duffel CMA Clinical Pharmacist Assistant [phone]

## 2023-01-04 ENCOUNTER — Ambulatory Visit: Payer: PPO

## 2023-01-12 ENCOUNTER — Other Ambulatory Visit: Payer: Self-pay | Admitting: Adult Health

## 2023-01-12 ENCOUNTER — Encounter: Payer: Self-pay | Admitting: Adult Health

## 2023-01-12 MED ORDER — AZITHROMYCIN 250 MG PO TABS: ORAL_TABLET | ORAL | 0 refills | Status: AC

## 2023-01-12 NOTE — Telephone Encounter (Signed)
Please advise 

## 2023-01-17 ENCOUNTER — Encounter (HOSPITAL_COMMUNITY): Payer: Self-pay

## 2023-01-17 ENCOUNTER — Ambulatory Visit (HOSPITAL_BASED_OUTPATIENT_CLINIC_OR_DEPARTMENT_OTHER): Payer: PPO

## 2023-01-17 VITALS — BP 121/79 | HR 69 | Temp 97.3°F | Ht 67.0 in | Wt 265.0 lb

## 2023-01-17 DIAGNOSIS — F319 Bipolar disorder, unspecified: Secondary | ICD-10-CM

## 2023-01-17 NOTE — Progress Notes (Signed)
Patient in today for due Abilify Maintena 400 mg IM every 28 day injection and presented with appropriate affect, level and pleasant mood and denied current problems with auditory or visual hallucinations, no suicidal or homicidal ideations and no plan, intent, or means stated to want to harm self or others.  Patient discussed the recent loss of his 80 year old cat and wife undergoing more testing for a potential return of cancer.  Patient optimistic this is just being looked into at this time and can be treated if any issues result.  Patient reported doing well on current medications with no other complaints or concerns noted.  Patient's due Abilify Maintena 400 mg IM injection prepared as ordered and given to patient in his left upper outer gluteal area.  Patient tolerated due injection without complaint of pain or discomfort and agreed to return in 4 weeks for his next due injection.  Patient to call if any issues prior to next appointment.

## 2023-01-19 ENCOUNTER — Telehealth: Payer: Self-pay | Admitting: Adult Health

## 2023-01-19 DIAGNOSIS — J988 Other specified respiratory disorders: Secondary | ICD-10-CM

## 2023-01-19 NOTE — Telephone Encounter (Signed)
Orders placed. Please schedule pt.

## 2023-01-19 NOTE — Telephone Encounter (Signed)
Spouse called to ask if she could bring Pt in today for a chest xray.  Will NP put in the order so that I can call her back to schedule?

## 2023-01-20 ENCOUNTER — Ambulatory Visit (INDEPENDENT_AMBULATORY_CARE_PROVIDER_SITE_OTHER): Payer: PPO

## 2023-01-20 ENCOUNTER — Other Ambulatory Visit: Payer: PPO

## 2023-01-20 DIAGNOSIS — J069 Acute upper respiratory infection, unspecified: Secondary | ICD-10-CM | POA: Diagnosis not present

## 2023-01-20 DIAGNOSIS — J988 Other specified respiratory disorders: Secondary | ICD-10-CM

## 2023-01-25 ENCOUNTER — Telehealth: Payer: Self-pay

## 2023-01-25 MED ORDER — AZELASTINE HCL 0.1 % NA SOLN: 2.0000 | Freq: Two times a day (BID) | NASAL | 6 refills | Status: AC

## 2023-01-25 NOTE — Progress Notes (Signed)
Patient ID: Sean Hill, male   DOB: 1941-02-13, 82 y.o.   MRN: [id number]  Care Management & Coordination Services Pharmacy Team  Reason for Encounter: Medication coordination and delivery/ Pravastatin Tolerance  Contacted patient to discuss medications and coordinate delivery from [facility]. Spoke with patient on 01/25/2023  Cycle dispensing form sent to [facility] for review.   Last adherence delivery date:11/08/22      Patient is due for next adherence delivery on: 02/06/23  This delivery to include: Adherence Packaging  90 Days  Cetirizine 10 mg: one tablet at breakfast Mens Multivitamin : Take one at breakfast Preservision Areds-2gt: one at breakfast and one at bedtime   Vitamin D3 1000 u: one tablet at breakfast Fenofibrate 145 mg: one tablet at breakfast Losartan (COZAAR) 50 MG tablet: one tablet at breakfast Pantoprazole (Protonix) 40 mg : one tab at breakfast and one at dinner Vascepa 1 gm: Take 2g at breakfast and 2g at Dinner Metformin 500 mg: one tablet at breakfast  Patient requested to add in Azelastine spray, sent request for refills to pharmacist  DOXYCYCLINE HYCLATE 100 MG CAP 01/03/2023 7   GABAPENTIN 100 MG CAPSULE 12/19/2022 30    Any concerns about your medications? No  How often do you forget or accidentally miss a dose? Rarely  Do you use a pillbox? No  Is patient in packaging Yes     Current antihyperlipidemic regimen:  Pravastatin 10 mg Take one every other day ASCVD risk enhancing conditions: age >59 and HTN 10-year ASCVD risk score: The ASCVD Risk score (Arnett DK, et al., 2019) failed to calculate for the following reasons:   The 2019 ASCVD risk score is only valid for ages 85 to 11 What recent interventions/DTPs have been made by any provider to improve Cholesterol control since last CPP Visit: Patient was advised to restart pravastatin and take at night to alleviate joint pain. Any recent hospitalizations or ED visits since last  visit with CPP? No Patient reports: He has not tried taking it again in the evenings as of yet has been dealing with sinus infection and on antibiotics the past few weeks and has not felt up to it.     Chart review: Recent office visits:  None  Recent consult visits:  None  Hospital visits:  None in previous 6 months  Medications: Outpatient Encounter Medications as of 01/25/2023  Medication Sig Note   albuterol (VENTOLIN HFA) 108 (90 Base) MCG/ACT inhaler Inhale 2 puffs into the lungs every 6 (six) hours as needed for wheezing or shortness of breath.    ARIPiprazole ER (ABILIFY MAINTENA) 400 MG PRSY prefilled syringe Inject 400 mg into the muscle every 28 (twenty-eight) days.    azelastine (ASTELIN) 0.1 % nasal spray Place 2 sprays into both nostrils 2 (two) times daily. Use in each nostril as directed    cetirizine (ZYRTEC) 10 MG tablet Take 10 mg by mouth every morning.    citalopram (CELEXA) 20 MG tablet Take 1 tablet (20 mg total) by mouth daily.    Continuous Blood Gluc Receiver (FREESTYLE LIBRE 2 READER) DEVI Use with libre sensors    Continuous Blood Gluc Sensor (FREESTYLE LIBRE 2 SENSOR) MISC 1 Device by Does not apply route every 14 (fourteen) days.    fenofibrate (TRICOR) 145 MG tablet TAKE ONE TABLET BY MOUTH ONCE DAILY 01/17/2023: As needed   gabapentin (NEURONTIN) 100 MG capsule Take one capsule (100 mg total) by mouth twice daily.    ipratropium-albuterol (DUONEB) 0.5-2.5 (3)  MG/3ML SOLN Take 3 mLs by nebulization every 6 (six) hours as needed. (Patient taking differently: Take 3 mLs by nebulization every 6 (six) hours as needed (shortness of breath).)    losartan (COZAAR) 50 MG tablet TAKE ONE TABLET BY MOUTH EVERY MORNING    metFORMIN (GLUCOPHAGE) 500 MG tablet TAKE ONE TABLET BY MOUTH ONCE DAILY    metoprolol tartrate (LOPRESSOR) 100 MG tablet Take 1 tablet by mouth once for procedure.    Multiple Vitamin (MULTIVITAMIN WITH MINERALS) TABS tablet Take 1 tablet by mouth  daily. 50 +    Multiple Vitamins-Minerals (PRESERVISION AREDS 2 PO) Take 1 capsule by mouth 2 (two) times daily.    naproxen sodium (ALEVE) 220 MG tablet Take 220 mg by mouth daily as needed (pain).    pantoprazole (PROTONIX) 40 MG tablet TAKE ONE TABLET BY MOUTH TWICE DAILY    pravastatin (PRAVACHOL) 10 MG tablet TAKE ONE TABLET BY MOUTH every other DAY (Patient not taking: Reported on 01/17/2023)    temazepam (RESTORIL) 15 MG capsule Take 1 capsule (15 mg total) by mouth at bedtime.    VASCEPA 1 g capsule TAKE TWO CAPSULES BY MOUTH TWICE DAILY    Vitamin D, Cholecalciferol, 1000 UNITS TABS Take 1,000 Units by mouth daily.     Facility-Administered Encounter Medications as of 01/25/2023  Medication   ARIPiprazole ER (ABILIFY MAINTENA) 400 MG prefilled syringe 400 mg   BP Readings from Last 3 Encounters:  01/03/23 110/60  08/16/22 138/70  07/14/22 (!) 129/49    Pulse Readings from Last 3 Encounters:  01/03/23 70  08/16/22 73  07/14/22 63    Lab Results  Component Value Date/Time   HGBA1C 5.9 08/16/2022 11:48 AM   HGBA1C 6.2 05/05/2022 10:07 AM   Lab Results  Component Value Date   CREATININE 1.03 08/16/2022   BUN 15 08/16/2022   GFR 69.00 08/16/2022   GFRNONAA >60 08/26/2021   GFRAA >60 10/09/2019   NA 139 08/16/2022   K 3.9 08/16/2022   CALCIUM 9.6 08/16/2022   CO2 26 08/16/2022        Pamala Duffel CMA Clinical Pharmacist Assistant [phone]

## 2023-01-25 NOTE — Telephone Encounter (Signed)
-----   Message from Sherrill Raring, [facility] sent at 01/25/2023  9:47 AM EDT ----- Regarding: Med Refill Request [facility] requesting refills on behalf of patient for the following prescription:  Azelastine (ASTELIN) 0.1 % nasal spray Place 2 sprays into both nostrils 2 (two) times daily. Use in each nostril as directed  Pharmacy Info: [facility] - [place], [place] - [address]  Phone: [phone] Fax: [phone]   Thank you! Sherrill Raring Clinical Pharmacist [phone]

## 2023-01-26 ENCOUNTER — Encounter (HOSPITAL_COMMUNITY): Payer: Self-pay | Admitting: Psychiatry

## 2023-01-26 ENCOUNTER — Telehealth (HOSPITAL_BASED_OUTPATIENT_CLINIC_OR_DEPARTMENT_OTHER): Payer: PPO | Admitting: Psychiatry

## 2023-01-26 VITALS — Wt 265.0 lb

## 2023-01-26 DIAGNOSIS — F319 Bipolar disorder, unspecified: Secondary | ICD-10-CM | POA: Diagnosis not present

## 2023-01-26 DIAGNOSIS — F411 Generalized anxiety disorder: Secondary | ICD-10-CM | POA: Diagnosis not present

## 2023-01-26 MED ORDER — ARIPIPRAZOLE ER 400 MG IM PRSY
400.0000 mg | PREFILLED_SYRINGE | INTRAMUSCULAR | 2 refills | Status: DC
Start: 2023-01-26 — End: 2023-04-27

## 2023-01-26 MED ORDER — CITALOPRAM HYDROBROMIDE 20 MG PO TABS
20.0000 mg | ORAL_TABLET | Freq: Every day | ORAL | 0 refills | Status: DC
Start: 2023-01-26 — End: 2023-02-03

## 2023-01-26 MED ORDER — GABAPENTIN 100 MG PO CAPS: ORAL_CAPSULE | ORAL | 0 refills | Status: DC

## 2023-01-26 MED ORDER — TEMAZEPAM 15 MG PO CAPS: 15.0000 mg | ORAL_CAPSULE | Freq: Every day | ORAL | 0 refills | Status: DC

## 2023-01-26 NOTE — Progress Notes (Signed)
[facility] MD Virtual Progress Note   Patient Location: Home Provider Location: Home Office  I connect with patient by telephone and verified that I am speaking with correct person by using two identifiers. I discussed the limitations of evaluation and management by telemedicine and the availability of in person appointments. I also discussed with the patient that there may be a patient responsible charge related to this service. The patient expressed understanding and agreed to proceed.  Sean Hill [id number] 82 y.o.  01/26/2023 10:49 AM  History of Present Illness:  Patient is evaluated by phone session.  He reported anxiety because wife have recurrence of cancer but recently provider reassured about the choices and treatment.  Patient feeling better.  He is prescribed gabapentin which he takes 100 mg 2 times a day.  Patient reported it helps anxiety and sleep.  Denies any crying spells or any feeling of hopelessness or worthlessness.  He had stopped the Cogentin that helps the memory attention concentration but patient has dry mouth and he drinks a lot of water.  He reported his sugar is under control.  He has no tremor or shakes or any EPS.  Denies any mania, psychosis, impulsive behavior.  He is pleased that his weight is unchanged from the past.  He is getting Abilify injection along with Celexa, gabapentin and temazepam.  He sleeps 7 to 8 hours.  He is in therapy with Jasmine December once a month.  He does not want to change the medication.  Past Psychiatric History: H/O multiple hospitalization.  Last inpatient in July 2014. H/O overdose on Latuda with alcohol. H/O cutting wrist.  Took Tegretol, Depakote, Ambien, Remeron, Vistaril, Geodon, Abilify, lithium, Provigil, Zoloft, Neurontin, Lamictal, Wellbutrin, Risperdal and Pristiq. H/O mania, aggression, getting speeding tickets, excessive buying and impulsive behavior.    Outpatient Encounter Medications as of 01/26/2023   Medication Sig   albuterol (VENTOLIN HFA) 108 (90 Base) MCG/ACT inhaler Inhale 2 puffs into the lungs every 6 (six) hours as needed for wheezing or shortness of breath.   ARIPiprazole ER (ABILIFY MAINTENA) 400 MG PRSY prefilled syringe Inject 400 mg into the muscle every 28 (twenty-eight) days.   azelastine (ASTELIN) 0.1 % nasal spray Place 2 sprays into both nostrils 2 (two) times daily. Use in each nostril as directed   cetirizine (ZYRTEC) 10 MG tablet Take 10 mg by mouth every morning.   citalopram (CELEXA) 20 MG tablet Take 1 tablet (20 mg total) by mouth daily.   Continuous Blood Gluc Receiver (FREESTYLE LIBRE 2 READER) DEVI Use with libre sensors   Continuous Blood Gluc Sensor (FREESTYLE LIBRE 2 SENSOR) MISC 1 Device by Does not apply route every 14 (fourteen) days.   fenofibrate (TRICOR) 145 MG tablet TAKE ONE TABLET BY MOUTH ONCE DAILY   gabapentin (NEURONTIN) 100 MG capsule Take one capsule (100 mg total) by mouth twice daily.   ipratropium-albuterol (DUONEB) 0.5-2.5 (3) MG/3ML SOLN Take 3 mLs by nebulization every 6 (six) hours as needed. (Patient taking differently: Take 3 mLs by nebulization every 6 (six) hours as needed (shortness of breath).)   losartan (COZAAR) 50 MG tablet TAKE ONE TABLET BY MOUTH EVERY MORNING   metFORMIN (GLUCOPHAGE) 500 MG tablet TAKE ONE TABLET BY MOUTH ONCE DAILY   metoprolol tartrate (LOPRESSOR) 100 MG tablet Take 1 tablet by mouth once for procedure.   Multiple Vitamin (MULTIVITAMIN WITH MINERALS) TABS tablet Take 1 tablet by mouth daily. 50 +   Multiple Vitamins-Minerals (PRESERVISION AREDS 2 PO) Take 1  capsule by mouth 2 (two) times daily.   naproxen sodium (ALEVE) 220 MG tablet Take 220 mg by mouth daily as needed (pain).   pantoprazole (PROTONIX) 40 MG tablet TAKE ONE TABLET BY MOUTH TWICE DAILY   pravastatin (PRAVACHOL) 10 MG tablet TAKE ONE TABLET BY MOUTH every other DAY (Patient not taking: Reported on 01/17/2023)   temazepam (RESTORIL) 15 MG  capsule Take 1 capsule (15 mg total) by mouth at bedtime.   VASCEPA 1 g capsule TAKE TWO CAPSULES BY MOUTH TWICE DAILY   Vitamin D, Cholecalciferol, 1000 UNITS TABS Take 1,000 Units by mouth daily.    Facility-Administered Encounter Medications as of 01/26/2023  Medication   ARIPiprazole ER (ABILIFY MAINTENA) 400 MG prefilled syringe 400 mg    No results found for this or any previous visit (from the past 2160 hour(s)).   Psychiatric Specialty Exam: Physical Exam  Review of Systems  Weight 265 lb (120.2 kg).There is no height or weight on file to calculate BMI.  General Appearance: NA  Eye Contact:  NA  Speech:  Slow  Volume:  Decreased  Mood:  Anxious  Affect:  NA  Thought Process:  Goal Directed  Orientation:  Full (Time, Place, and Person)  Thought Content:  Rumination  Suicidal Thoughts:  No  Homicidal Thoughts:  No  Memory:  Immediate;   Fair Recent;   Fair Remote;   Fair  Judgement:  Intact  Insight:  Present  Psychomotor Activity:  Decreased  Concentration:  Concentration: Fair and Attention Span: Fair  Recall:  Good  Fund of Knowledge:  Good  Language:  Good  Akathisia:  No  Handed:  Right  AIMS (if indicated):     Assets:  Communication Skills Desire for Improvement Housing Resilience Social Support Transportation  ADL's:  Intact  Cognition:  WNL  Sleep:  ok     Assessment/Plan: Bipolar I disorder (HCC) - Plan: ARIPiprazole ER (ABILIFY MAINTENA) 400 MG PRSY prefilled syringe, gabapentin (NEURONTIN) 100 MG capsule, temazepam (RESTORIL) 15 MG capsule  GAD (generalized anxiety disorder) - Plan: citalopram (CELEXA) 20 MG tablet, gabapentin (NEURONTIN) 100 MG capsule  Patient is stable on his current medication.  Since started the gabapentin his anxiety is better.  Continue gabapentin 100 mg 2 times a day, Celexa 20 mg daily, temazepam 15 mg at bedtime and Abilify injection 400 mg intramuscular every 28 days.  Recommend to call us back if is any question or  any concern.  Follow-up in 3 months.   Follow Up Instructions:     I discussed the assessment and treatment plan with the patient. The patient was provided an opportunity to ask questions and all were answered. The patient agreed with the plan and demonstrated an understanding of the instructions.   The patient was advised to call back or seek an in-person evaluation if the symptoms worsen or if the condition fails to improve as anticipated.    Collaboration of Care: Other provider involved in patient's care AEB notes are available in epic to review.  Patient/Guardian was advised Release of Information must be obtained prior to any record release in order to collaborate their care with an outside provider. Patient/Guardian was advised if they have not already done so to contact the registration department to sign all necessary forms in order for [place] to release information regarding their care.   Consent: Patient/Guardian gives verbal consent for treatment and assignment of benefits for services provided during this visit. Patient/Guardian expressed understanding and agreed to proceed.  I provided 18 minutes of non face to face time during this encounter.  Note: This document was prepared by [employer] voice dictation technology and any errors that results from this process are unintentional.    Cleotis Nipper, MD 01/26/2023

## 2023-01-27 ENCOUNTER — Telehealth (HOSPITAL_COMMUNITY): Payer: PPO | Admitting: Psychiatry

## 2023-02-01 ENCOUNTER — Other Ambulatory Visit: Payer: Self-pay | Admitting: Adult Health

## 2023-02-02 ENCOUNTER — Other Ambulatory Visit (HOSPITAL_COMMUNITY): Payer: Self-pay | Admitting: Psychiatry

## 2023-02-02 DIAGNOSIS — F319 Bipolar disorder, unspecified: Secondary | ICD-10-CM

## 2023-02-02 DIAGNOSIS — F411 Generalized anxiety disorder: Secondary | ICD-10-CM

## 2023-02-03 ENCOUNTER — Other Ambulatory Visit (HOSPITAL_COMMUNITY): Payer: Self-pay | Admitting: Psychiatry

## 2023-02-03 ENCOUNTER — Telehealth (HOSPITAL_COMMUNITY): Payer: Self-pay | Admitting: *Deleted

## 2023-02-03 DIAGNOSIS — F411 Generalized anxiety disorder: Secondary | ICD-10-CM

## 2023-02-03 MED ORDER — CITALOPRAM HYDROBROMIDE 20 MG PO TABS
20.0000 mg | ORAL_TABLET | Freq: Every day | ORAL | 0 refills | Status: DC
Start: 2023-02-03 — End: 2023-04-27

## 2023-02-03 NOTE — Telephone Encounter (Signed)
[facility] REQUESTING FOR PILL PACK -- SPOKE WITH Rx & THEY STATED HEY HAVEN'T RECEIVED THE SCRIPTS DATED 01/26/23   citalopram (CELEXA) 20 MG tablet [[id number]]   Order Details Dose: 20 mg Route: Oral Frequency: Daily  Dispense Quantity: 90 tablet Refills: 0        Sig: Take 1 tablet (20 mg total) by mouth daily.       Start Date: 01/26/23 End Date: 04/26/23 after 90 doses  Written Date: 01/26/23 Expiration Date: 01/26/24       gabapentin (NEURONTIN) 100 MG capsule [[id number]]   Order Details Dose, Route, Frequency: As Directed  Dispense Quantity: 180 capsule Refills: 0        Sig: Take one capsule (100 mg total) by mouth twice daily.       Start Date: 01/26/23 End Date: --  Written Date: 01/26/23 Expiration Date: 01/26/24

## 2023-02-16 ENCOUNTER — Ambulatory Visit (HOSPITAL_BASED_OUTPATIENT_CLINIC_OR_DEPARTMENT_OTHER): Payer: PPO | Admitting: *Deleted

## 2023-02-16 VITALS — BP 129/76 | HR 77 | Resp 20 | Ht 67.0 in | Wt 267.8 lb

## 2023-02-16 DIAGNOSIS — F319 Bipolar disorder, unspecified: Secondary | ICD-10-CM

## 2023-02-16 NOTE — Patient Instructions (Signed)
Pt presents to clinic today for due Abilify Maintena 400 mg injection. Pt is appropriate and cooperative on approach. Mood is anxious and affect appropriate to mood. Pt noted to be fidgety today. Pt says that he is actually feeling less anxious as his wife is stable medically at the moment. Pt d/c the Neurontin he says r/t dizziness and fall. Pt denies SI,HI, or AVH. Pt looking forward to going to the beach for a week starting tomorrow. Injection prepared and given as ordered. Injection administered in RUOQ without any complaints. Pt to return in approximately 28 days for next due injection.

## 2023-02-22 ENCOUNTER — Telehealth: Payer: Self-pay

## 2023-02-22 DIAGNOSIS — Z96651 Presence of right artificial knee joint: Secondary | ICD-10-CM | POA: Diagnosis not present

## 2023-02-22 NOTE — Progress Notes (Signed)
Patient ID: ARDEL Hill, male   DOB: 1941/01/08, 82 y.o.   MRN: [id number]  Care Management & Coordination Services Pharmacy Team  Reason for Encounter: Hyperlipidemia / Cholesterol  Contacted patient to discuss hyperlipidemia disease state. Spoke with patient on 02/22/2023    Current antihyperlipidemic regimen:  Pravastatin 10 mg Take one every other day  ASCVD risk enhancing conditions: age >87 10-year ASCVD risk score: The ASCVD Risk score (Arnett DK, et al., 2019) failed to calculate for the following reasons:   The 2019 ASCVD risk score is only valid for ages 17 to 42 What recent interventions/DTPs have been made by any provider to improve Cholesterol control since last CPP Visit: Patient reports none Any recent hospitalizations or ED visits since last visit with CPP? No Patient reports improvement from sinus infection reports he has some drainage but very minor and more like allergies at present. He reports he has not restarted the statin at night as of yet but will try and remember to do so this evening. Advised I would follow up in a few weeks with him on this and he was in agreement.  Adherence Review: Is the patient currently on STATIN medication? Yes Does the patient have >5 day gap between last estimated fill dates? Yes  Star Rating Drugs:  Losartan 50 mg - Last filled 02/02/23 90 DS at [facility] Metformin 500 mg - Last filled 02/02/23 90 DS at [facility] Pravastatin 10 mg - Last filled 08/05/22 90 Ds at [facility]   Chart Updates: Recent office visits:  None  Recent consult visits:  02/16/23  Harvel Quale LPN [facility]) - Patient presented for Bipolar disorder. No other visit details available.   01/26/23  Arfeen, Kathi [facility]) - Patient presented via video for Bipolar disorder. No other visit details available.   Hospital visits:  None in previous 6 months  Medications: Outpatient Encounter Medications as of 02/22/2023  Medication Sig Note    albuterol (VENTOLIN HFA) 108 (90 Base) MCG/ACT inhaler Inhale 2 puffs into the lungs every 6 (six) hours as needed for wheezing or shortness of breath.    ARIPiprazole ER (ABILIFY MAINTENA) 400 MG PRSY prefilled syringe Inject 400 mg into the muscle every 28 (twenty-eight) days.    azelastine (ASTELIN) 0.1 % nasal spray Place 2 sprays into both nostrils 2 (two) times daily. Use in each nostril as directed    cetirizine (ZYRTEC) 10 MG tablet Take 10 mg by mouth every morning.    citalopram (CELEXA) 20 MG tablet Take 1 tablet (20 mg total) by mouth daily.    Continuous Blood Gluc Receiver (FREESTYLE LIBRE 2 READER) DEVI Use with libre sensors    Continuous Blood Gluc Sensor (FREESTYLE LIBRE 2 SENSOR) MISC 1 Device by Does not apply route every 14 (fourteen) days.    fenofibrate (TRICOR) 145 MG tablet TAKE ONE TABLET BY MOUTH ONCE DAILY 01/17/2023: As needed   gabapentin (NEURONTIN) 100 MG capsule Take one capsule (100 mg total) by mouth twice daily.    ipratropium-albuterol (DUONEB) 0.5-2.5 (3) MG/3ML SOLN Take 3 mLs by nebulization every 6 (six) hours as needed. (Patient taking differently: Take 3 mLs by nebulization every 6 (six) hours as needed (shortness of breath).)    losartan (COZAAR) 50 MG tablet TAKE ONE TABLET BY MOUTH EVERY MORNING    metFORMIN (GLUCOPHAGE) 500 MG tablet TAKE ONE TABLET BY MOUTH ONCE DAILY    metoprolol tartrate (LOPRESSOR) 100 MG tablet Take 1 tablet by mouth once for procedure.  Multiple Vitamin (MULTIVITAMIN WITH MINERALS) TABS tablet Take 1 tablet by mouth daily. 50 +    Multiple Vitamins-Minerals (PRESERVISION AREDS 2 PO) Take 1 capsule by mouth 2 (two) times daily.    naproxen sodium (ALEVE) 220 MG tablet Take 220 mg by mouth daily as needed (pain).    pantoprazole (PROTONIX) 40 MG tablet TAKE ONE TABLET BY MOUTH TWICE DAILY    pravastatin (PRAVACHOL) 10 MG tablet TAKE ONE TABLET BY MOUTH every other DAY (Patient not taking: Reported on 01/17/2023)    temazepam  (RESTORIL) 15 MG capsule Take 1 capsule (15 mg total) by mouth at bedtime.    VASCEPA 1 g capsule TAKE TWO CAPSULES BY MOUTH TWICE DAILY    Vitamin D, Cholecalciferol, 1000 UNITS TABS Take 1,000 Units by mouth daily.     Facility-Administered Encounter Medications as of 02/22/2023  Medication   ARIPiprazole ER (ABILIFY MAINTENA) 400 MG prefilled syringe 400 mg    Lipid Panel    Component Value Date/Time   CHOL 173 05/05/2022 1007   TRIG 218.0 (H) 05/05/2022 1007   HDL 39.20 05/05/2022 1007   LDLDIRECT 106.0 05/05/2022 1007     Kidney Function Lab Results  Component Value Date/Time   CREATININE 1.03 08/16/2022 11:48 AM   CREATININE 1.13 05/26/2022 10:06 AM   CREATININE 1.00 09/24/2019 12:21 PM   CREATININE 0.88 12/03/2014 03:47 PM   GFR 69.00 08/16/2022 11:48 AM   GFRNONAA >60 08/26/2021 03:37 AM   GFRNONAA 73 09/24/2019 12:21 PM   GFRAA >60 10/09/2019 05:00 AM   GFRAA 84 09/24/2019 12:21 PM      Pamala Duffel CMA Clinical Pharmacist Assistant [phone]

## 2023-03-13 DIAGNOSIS — M5136 Other intervertebral disc degeneration, lumbar region: Secondary | ICD-10-CM | POA: Diagnosis not present

## 2023-03-13 DIAGNOSIS — M545 Low back pain, unspecified: Secondary | ICD-10-CM | POA: Diagnosis not present

## 2023-03-13 DIAGNOSIS — M47816 Spondylosis without myelopathy or radiculopathy, lumbar region: Secondary | ICD-10-CM | POA: Diagnosis not present

## 2023-03-16 ENCOUNTER — Ambulatory Visit (HOSPITAL_BASED_OUTPATIENT_CLINIC_OR_DEPARTMENT_OTHER): Payer: PPO | Admitting: *Deleted

## 2023-03-16 VITALS — BP 126/69 | HR 76 | Resp 20 | Ht 67.0 in | Wt 267.0 lb

## 2023-03-16 DIAGNOSIS — F319 Bipolar disorder, unspecified: Secondary | ICD-10-CM | POA: Diagnosis not present

## 2023-03-16 NOTE — Patient Instructions (Addendum)
Pt presents to office today for due Abilify Maintena 400 mg injection. Pt appears anxious in mood and affect but is appropriate and cooperative on approach. Pt has c/o back pain and has recently received a Cortisone injection but still having pain. Spouse is doing well so he is a bit less stressed about her health. Pt denies any AVH, lability, SI or HI. Injection prepared and given as ordered in LUOQ without complaint. Pt will return in approximately 28 days for next due injection. Pt agrees to call with any questions or concerns prior to next visit.

## 2023-03-30 ENCOUNTER — Ambulatory Visit (INDEPENDENT_AMBULATORY_CARE_PROVIDER_SITE_OTHER): Payer: PPO | Admitting: Adult Health

## 2023-03-30 ENCOUNTER — Encounter: Payer: Self-pay | Admitting: Adult Health

## 2023-03-30 ENCOUNTER — Ambulatory Visit: Payer: PPO | Admitting: Family Medicine

## 2023-03-30 ENCOUNTER — Ambulatory Visit: Payer: PPO

## 2023-03-30 VITALS — BP 120/58 | HR 75 | Temp 98.1°F | Ht 67.0 in | Wt 273.0 lb

## 2023-03-30 DIAGNOSIS — R053 Chronic cough: Secondary | ICD-10-CM | POA: Diagnosis not present

## 2023-03-30 DIAGNOSIS — J069 Acute upper respiratory infection, unspecified: Secondary | ICD-10-CM

## 2023-03-30 DIAGNOSIS — I700 Atherosclerosis of aorta: Secondary | ICD-10-CM | POA: Diagnosis not present

## 2023-03-30 MED ORDER — ALBUTEROL SULFATE HFA 108 (90 BASE) MCG/ACT IN AERS: 2.0000 | INHALATION_SPRAY | Freq: Four times a day (QID) | RESPIRATORY_TRACT | 3 refills | Status: DC | PRN

## 2023-03-30 MED ORDER — AZITHROMYCIN 250 MG PO TABS
ORAL_TABLET | ORAL | 0 refills | Status: AC
Start: 2023-03-30 — End: 2023-04-04

## 2023-03-30 MED ORDER — HYDROCODONE BIT-HOMATROP MBR 5-1.5 MG/5ML PO SOLN
5.0000 mL | Freq: Three times a day (TID) | ORAL | 0 refills | Status: DC | PRN
Start: 2023-03-30 — End: 2023-10-23

## 2023-03-30 MED ORDER — IPRATROPIUM-ALBUTEROL 0.5-2.5 (3) MG/3ML IN SOLN
3.0000 mL | Freq: Four times a day (QID) | RESPIRATORY_TRACT | 0 refills | Status: DC | PRN
Start: 2023-03-30 — End: 2023-04-26

## 2023-03-30 NOTE — Progress Notes (Signed)
Subjective:    Patient ID: Sean Hill, male    DOB: [birth year], 82 y.o.   MRN: [id number]  HPI  82 year old male who  has a past medical history of Anal fissure, Anxiety, Arthritis, Asthma, Bipolar disorder (HCC), Cataract, Depression, Difficult airway for intubation, GERD (gastroesophageal reflux disease), Hemorrhage of colon following colonoscopy (10/08/2019), History of kidney stones, History of MRSA infection, Hyperlipidemia, Hypertension, Impaired hearing, Left bundle branch block (LBBB) on electrocardiogram (10/29/2015), Macular degeneration (senile) of retina, and Pre-diabetes.  Presents to the office today for an acute on chronic issue.  He reports having cough x 1 month.  He and his family went to the beach roughly a month ago and cough started after he was exposed to somebody in the family with a cold.  Since then the cough has become worse, currently coughing up dark green/gray/blood streaked sputum.  His wife reports that he seems to be aspirating as coughing fits last 10 to 15 minutes at a time.  He has not had any wheezing.  Does feel shortness of breath but this is a chronic issue.  He has not been using his albuterol inhaler or nebulizer.  Denies fevers or chills.  Feels as though he has some sinus drainage as well.  Currently finished a 7-day course of prednisone for chronic back pain, he did not feel as though this made any improvement in his cough.  Past he has refused pulmonary consult. He did have PFTs done in September 2023 that were ordered by cardiology which showed normal PFTs.  Review of Systems See HPI   Past Medical History:  Diagnosis Date   Anal fissure    Anxiety    Arthritis    right wrist; pt. states "everywhere"   Asthma    Bipolar disorder (HCC)    Cataract    Depression    Difficult airway for intubation    GERD (gastroesophageal reflux disease)    Hemorrhage of colon following colonoscopy 10/08/2019   History of kidney stones    History of  MRSA infection    left knee after arthroplasty   Hyperlipidemia    Hypertension    states under control with med., has been on med. x 1 yr.   Impaired hearing    Left bundle branch block (LBBB) on electrocardiogram 10/29/2015   Macular degeneration (senile) of retina    left   Pre-diabetes     Social History   Socioeconomic History   Marital status: Married    Spouse name: Not on file   Number of children: Not on file   Years of education: Not on file   Highest education level: 12th grade  Occupational History   Not on file  Tobacco Use   Smoking status: Never   Smokeless tobacco: Never  Vaping Use   Vaping status: Never Used  Substance and Sexual Activity   Alcohol use: Not Currently    Comment: occasional.   Drug use: No   Sexual activity: Not Currently    Birth control/protection: None  Other Topics Concern   Not on file  Social History Narrative   Married, lives in [place].  Retired Quarry manager.   Two daughters, both married,3 grandchildren    No regular exercise.  Never smoker.  No ETOH.  No drugs.   Social Determinants of Health   Financial Resource Strain: Low Risk  (01/02/2023)   Overall Financial Resource Strain (CARDIA)    Difficulty of Paying Living Expenses: Not hard at  all  Food Insecurity: No Food Insecurity (01/02/2023)   Hunger Vital Sign    Worried About Running Out of Food in the Last Year: Never true    Ran Out of Food in the Last Year: Never true  Transportation Needs: No Transportation Needs (01/02/2023)   PRAPARE - Administrator, Civil Service (Medical): No    Lack of Transportation (Non-Medical): No  Physical Activity: Inactive (01/02/2023)   Exercise Vital Sign    Days of Exercise per Week: 0 days    Minutes of Exercise per Session: 0 min  Stress: Stress Concern Present (01/02/2023)   [employer] of Occupational Health - Occupational Stress Questionnaire    Feeling of Stress : Rather much  Social Connections:  Moderately Integrated (01/02/2023)   Social Connection and Isolation Panel [NHANES]    Frequency of Communication with Friends and Family: More than three times a week    Frequency of Social Gatherings with Friends and Family: Three times a week    Attends Religious Services: Never    Active Member of Clubs or Organizations: No    Attends Banker Meetings: More than 4 times per year    Marital Status: Married  Catering manager Violence: Not At Risk (09/26/2022)   Humiliation, Afraid, Rape, and Kick questionnaire    Fear of Current or Ex-Partner: No    Emotionally Abused: No    Physically Abused: No    Sexually Abused: No    Past Surgical History:  Procedure Laterality Date   ANKLE FUSION Bilateral    ANKLE SURGERY Bilateral    ligament surgery   BIOPSY  04/29/2020   Procedure: BIOPSY;  Surgeon: Shellia Cleverly, DO;  Location: [facility];  Service: Gastroenterology;;   BIOPSY  07/14/2022   Procedure: BIOPSY;  Surgeon: Shellia Cleverly, DO;  Location: [facility];  Service: Gastroenterology;;   CARDIAC CATHETERIZATION  x 2   1983; 11/14/2003   CARPOMETACARPEL SUSPENSION PLASTY Right 09/22/2016   Procedure: right thumb carpometacarpal  ARTHROPLASTY;  Surgeon: Betha Loa, MD;  Location: [facility];  Service: Orthopedics;  Laterality: Right;  right thumb carpometacarpal  ARTHROPLASTY   CARPOMETACARPEL SUSPENSION PLASTY Right 11/02/2017   Procedure: RIGHT THUMB SUSPENSIONPLASTY WITH TIGHTROPE, DISTAL POLE SCAPHOID  EXCISION;  Surgeon: Betha Loa, MD;  Location: [facility];  Service: Orthopedics;  Laterality: Right;   COLONOSCOPY WITH PROPOFOL N/A 10/02/2019   Procedure: COLONOSCOPY WITH PROPOFOL;  Surgeon: Shellia Cleverly, DO;  Location: [facility];  Service: Gastroenterology;  Laterality: N/A;   COLONOSCOPY WITH PROPOFOL N/A 10/09/2019   Procedure: COLONOSCOPY WITH PROPOFOL;  Surgeon: Iva Boop, MD;  Location: [facility];   Service: Endoscopy;  Laterality: N/A;   COLONOSCOPY WITH PROPOFOL N/A 04/29/2020   Procedure: COLONOSCOPY WITH PROPOFOL;  Surgeon: Shellia Cleverly, DO;  Location: [facility];  Service: Gastroenterology;  Laterality: N/A;   COLONOSCOPY WITH PROPOFOL N/A 11/11/2021   Procedure: COLONOSCOPY WITH PROPOFOL;  Surgeon: Shellia Cleverly, DO;  Location: [facility];  Service: Gastroenterology;  Laterality: N/A;   ELBOW SURGERY Left    ENDOSCOPIC MUCOSAL RESECTION N/A 10/02/2019   Procedure: ENDOSCOPIC MUCOSAL RESECTION;  Surgeon: Shellia Cleverly, DO;  Location: [facility];  Service: Gastroenterology;  Laterality: N/A;   ESOPHAGOGASTRODUODENOSCOPY (EGD) WITH PROPOFOL N/A 07/14/2022   Procedure: ESOPHAGOGASTRODUODENOSCOPY (EGD) WITH PROPOFOL;  Surgeon: Shellia Cleverly, DO;  Location: [facility];  Service: Gastroenterology;  Laterality: N/A;   HEMOSTASIS CLIP PLACEMENT  10/09/2019   Procedure:  HEMOSTASIS CLIP PLACEMENT;  Surgeon: Iva Boop, MD;  Location: [facility];  Service: Endoscopy;;   HEMOSTASIS CLIP PLACEMENT  11/11/2021   Procedure: HEMOSTASIS CLIP PLACEMENT;  Surgeon: Shellia Cleverly, DO;  Location: [facility];  Service: Gastroenterology;;   HEMOSTASIS CLIP PLACEMENT  07/14/2022   Procedure: HEMOSTASIS CLIP PLACEMENT;  Surgeon: Shellia Cleverly, DO;  Location: [facility];  Service: Gastroenterology;;   INGUINAL HERNIA REPAIR Right    KNEE SURGERY     OPEN REDUCTION INTERNAL FIXATION (ORIF) DISTAL RADIAL FRACTURE Right 10/29/2015   Procedure: OPEN REDUCTION INTERNAL FIXATION (ORIF) DISTAL RADIAL FRACTURE;  Surgeon: Betha Loa, MD;  Location: [facility];  Service: Orthopedics;  Laterality: Right;   ORIF ELBOW FRACTURE Right    POLYPECTOMY  04/29/2020   Procedure: POLYPECTOMY;  Surgeon: Shellia Cleverly, DO;  Location: [facility];  Service: Gastroenterology;;   POLYPECTOMY  11/11/2021   Procedure: POLYPECTOMY;  Surgeon: Shellia Cleverly, DO;   Location: [facility];  Service: Gastroenterology;;   POLYPECTOMY  07/14/2022   Procedure: POLYPECTOMY;  Surgeon: Shellia Cleverly, DO;  Location: [facility];  Service: Gastroenterology;;   Gaspar Bidding DILATION N/A 07/14/2022   Procedure: SAVORY DILATION;  Surgeon: Shellia Cleverly, DO;  Location: [facility];  Service: Gastroenterology;  Laterality: N/A;   SUBMUCOSAL LIFTING INJECTION  10/02/2019   Procedure: SUBMUCOSAL LIFTING INJECTION;  Surgeon: Shellia Cleverly, DO;  Location: [facility];  Service: Gastroenterology;;   TONSILLECTOMY     TOTAL KNEE ARTHROPLASTY Bilateral    TOTAL KNEE REVISION Right 08/25/2021   Procedure: Right knee polyethylene vs total knee arthroplasty revision;  Surgeon: Ollen Gross, MD;  Location: [facility];  Service: Orthopedics;  Laterality: Right;   ULNAR COLLATERAL LIGAMENT REPAIR Right 12/29/2015   Procedure: REPAIR  RIGHT LATERAL ULNAR COLLATERAL LIGAMENT TEAR  EXTENSOR ORIGIN ;  Surgeon: Betha Loa, MD;  Location: [facility];  Service: Orthopedics;  Laterality: Right;   WRIST ARTHROSCOPY WITH DEBRIDEMENT Right 07/14/2016   Procedure: RIGHT WRIST ARTHROSCOPY WITH DEBRIDEMENT  TRIANGULAR FIBROCARTILAGE COMPLEX;  Surgeon: Betha Loa, MD;  Location: [facility];  Service: Orthopedics;  Laterality: Right;    Family History  Problem Relation Age of Onset   Alcohol abuse Mother    Ovarian cancer Mother    Alcohol abuse Father    Pancreatic cancer Father    Hyperlipidemia Brother    Barrett's esophagus Brother    Colon cancer Maternal Grandmother    Depression Daughter    Paranoid behavior Daughter    Esophageal cancer Neg Hx    Stomach cancer Neg Hx    Rectal cancer Neg Hx    Colon polyps Neg Hx     Allergies  Allergen Reactions   Penicillins Other (See Comments)    BLISTERS Did it involve swelling of the face/tongue/throat, SOB, or low BP? No Did it involve sudden or severe rash/hives, skin peeling, or any  reaction on the inside of your mouth or nose? No Did you need to seek medical attention at a hospital or doctor's office? No When did it last happen? around 1980    If all above answers are "NO", may proceed with cephalosporin use. Tolerated Cephalosporin Date: 08/26/21.     Lisinopril Diarrhea and Cough    Dry cough, abdominal bloating and diarrhea   Zetia [Ezetimibe]     Myalgia    Codeine Rash    Current Outpatient Medications on File Prior to Visit  Medication Sig Dispense Refill  albuterol (VENTOLIN HFA) 108 (90 Base) MCG/ACT inhaler Inhale 2 puffs into the lungs every 6 (six) hours as needed for wheezing or shortness of breath. 6.7 g 3   ARIPiprazole ER (ABILIFY MAINTENA) 400 MG PRSY prefilled syringe Inject 400 mg into the muscle every 28 (twenty-eight) days. 1 each 2   azelastine (ASTELIN) 0.1 % nasal spray Place 2 sprays into both nostrils 2 (two) times daily. Use in each nostril as directed 30 mL 6   cetirizine (ZYRTEC) 10 MG tablet Take 10 mg by mouth every morning.     citalopram (CELEXA) 20 MG tablet Take 1 tablet (20 mg total) by mouth daily. 90 tablet 0   Continuous Blood Gluc Receiver (FREESTYLE LIBRE 2 READER) DEVI Use with libre sensors 1 each 0   Continuous Blood Gluc Sensor (FREESTYLE LIBRE 2 SENSOR) MISC 1 Device by Does not apply route every 14 (fourteen) days. 2 each 11   fenofibrate (TRICOR) 145 MG tablet TAKE ONE TABLET BY MOUTH ONCE DAILY 90 tablet 3   gabapentin (NEURONTIN) 100 MG capsule Take one capsule (100 mg total) by mouth twice daily. 180 capsule 0   ipratropium-albuterol (DUONEB) 0.5-2.5 (3) MG/3ML SOLN Take 3 mLs by nebulization every 6 (six) hours as needed. (Patient taking differently: Take 3 mLs by nebulization every 6 (six) hours as needed (shortness of breath).) 360 mL 0   losartan (COZAAR) 50 MG tablet TAKE ONE TABLET BY MOUTH EVERY MORNING 90 tablet 3   metFORMIN (GLUCOPHAGE) 500 MG tablet TAKE ONE TABLET BY MOUTH ONCE DAILY 90 tablet 0    metoprolol tartrate (LOPRESSOR) 100 MG tablet Take 1 tablet by mouth once for procedure. 1 tablet 0   Multiple Vitamin (MULTIVITAMIN WITH MINERALS) TABS tablet Take 1 tablet by mouth daily. 50 +     Multiple Vitamins-Minerals (PRESERVISION AREDS 2 PO) Take 1 capsule by mouth 2 (two) times daily.     naproxen sodium (ALEVE) 220 MG tablet Take 220 mg by mouth daily as needed (pain).     pantoprazole (PROTONIX) 40 MG tablet TAKE ONE TABLET BY MOUTH TWICE DAILY 180 tablet 1   pravastatin (PRAVACHOL) 10 MG tablet TAKE ONE TABLET BY MOUTH every other DAY 45 tablet 3   temazepam (RESTORIL) 15 MG capsule Take 1 capsule (15 mg total) by mouth at bedtime. 90 capsule 0   VASCEPA 1 g capsule TAKE TWO CAPSULES BY MOUTH TWICE DAILY 360 capsule 3   Vitamin D, Cholecalciferol, 1000 UNITS TABS Take 1,000 Units by mouth daily.      Current Facility-Administered Medications on File Prior to Visit  Medication Dose Route Frequency Provider Last Rate Last Admin   ARIPiprazole ER (ABILIFY MAINTENA) 400 MG prefilled syringe 400 mg  400 mg Intramuscular Q28 days Arfeen, Phillips Grout, MD   400 mg at 03/16/23 1445    BP (!) 120/58   Pulse 75   Temp 98.1 F (36.7 C) (Oral)   Ht 5\' 7"  (1.702 m)   Wt 273 lb (123.8 kg)   SpO2 98%   BMI 42.76 kg/m       Objective:   Physical Exam Vitals and nursing note reviewed.  Constitutional:      Appearance: Normal appearance.  Cardiovascular:     Rate and Rhythm: Normal rate and regular rhythm.     Pulses: Normal pulses.     Heart sounds: Normal heart sounds.  Pulmonary:     Effort: Pulmonary effort is normal.     Breath sounds: No stridor. Examination of  the right-lower field reveals decreased breath sounds. Examination of the left-lower field reveals decreased breath sounds. Decreased breath sounds and rhonchi present. No wheezing or rales.     Comments: Harsh cough noted  Skin:    General: Skin is warm and dry.  Neurological:     General: No focal deficit present.      Mental Status: He is alert and oriented to person, place, and time.  Psychiatric:        Mood and Affect: Mood normal.        Behavior: Behavior normal.        Thought Content: Thought content normal.        Judgment: Judgment normal.       Assessment & Plan:  1. Chronic cough - Will order chest xray due to turn of aspiration pneumonia.  Will place on azithromycin send in hydrocodone and DuoNeb and albuterol inhaler.  I am going to refer him to pulmonary due to the ongoing issue. - Advised of follow up instructions  - DG Chest 2 View; Future - HYDROcodone bit-homatropine (HYCODAN) 5-1.5 MG/5ML syrup; Take 5 mLs by mouth every 8 (eight) hours as needed for cough.  Dispense: 120 mL; Refill: 0 - albuterol (VENTOLIN HFA) 108 (90 Base) MCG/ACT inhaler; Inhale 2 puffs into the lungs every 6 (six) hours as needed for wheezing or shortness of breath.  Dispense: 6.7 g; Refill: 3 - ipratropium-albuterol (DUONEB) 0.5-2.5 (3) MG/3ML SOLN; Take 3 mLs by nebulization every 6 (six) hours as needed.  Dispense: 360 mL; Refill: 0 - Ambulatory referral to Pulmonology  2. Upper respiratory tract infection, unspecified type  - azithromycin (ZITHROMAX) 250 MG tablet; Take 2 tablets on day 1, then 1 tablet daily on days 2 through 5  Dispense: 6 tablet; Refill: 0 - albuterol (VENTOLIN HFA) 108 (90 Base) MCG/ACT inhaler; Inhale 2 puffs into the lungs every 6 (six) hours as needed for wheezing or shortness of breath.  Dispense: 6.7 g; Refill: 3  Shirline Frees, NP  Time spent with patient today was 33 minutes which consisted of chart review, discussing cough, concern for aspiration pneumonia,  work up, treatment answering questions and documentation.

## 2023-04-10 DIAGNOSIS — M47816 Spondylosis without myelopathy or radiculopathy, lumbar region: Secondary | ICD-10-CM | POA: Diagnosis not present

## 2023-04-10 DIAGNOSIS — M545 Low back pain, unspecified: Secondary | ICD-10-CM | POA: Diagnosis not present

## 2023-04-18 ENCOUNTER — Other Ambulatory Visit: Payer: Self-pay | Admitting: Adult Health

## 2023-04-18 ENCOUNTER — Ambulatory Visit (INDEPENDENT_AMBULATORY_CARE_PROVIDER_SITE_OTHER): Payer: PPO | Admitting: Adult Health

## 2023-04-18 ENCOUNTER — Encounter: Payer: Self-pay | Admitting: Adult Health

## 2023-04-18 VITALS — BP 122/62 | HR 98 | Temp 97.9°F | Ht 67.0 in | Wt 270.0 lb

## 2023-04-18 DIAGNOSIS — R053 Chronic cough: Secondary | ICD-10-CM | POA: Diagnosis not present

## 2023-04-18 DIAGNOSIS — M5416 Radiculopathy, lumbar region: Secondary | ICD-10-CM | POA: Diagnosis not present

## 2023-04-18 MED ORDER — BUDESONIDE-FORMOTEROL FUMARATE 80-4.5 MCG/ACT IN AERO
2.0000 | INHALATION_SPRAY | Freq: Two times a day (BID) | RESPIRATORY_TRACT | 3 refills | Status: AC
Start: 2023-04-18 — End: ?

## 2023-04-18 MED ORDER — PREDNISONE 20 MG PO TABS
20.0000 mg | ORAL_TABLET | Freq: Every day | ORAL | 0 refills | Status: DC
Start: 2023-04-18 — End: 2023-05-24

## 2023-04-18 NOTE — Progress Notes (Signed)
Subjective:    Patient ID: Sean Hill, male    DOB: [birth year], 82 y.o.   MRN: [id number]  HPI  82 year old male who  has a past medical history of Anal fissure, Anxiety, Arthritis, Asthma, Bipolar disorder (HCC), Cataract, Depression, Difficult airway for intubation, GERD (gastroesophageal reflux disease), Hemorrhage of colon following colonoscopy (10/08/2019), History of kidney stones, History of MRSA infection, Hyperlipidemia, Hypertension, Impaired hearing, Left bundle branch block (LBBB) on electrocardiogram (10/29/2015), Macular degeneration (senile) of retina, and Pre-diabetes.  He presents to the office today for cough.   He was seen roughly 2 weeks ago after having a cough for 1 month.  At this time he was coughing up dark green/gray/bloody streaked sputum.  His wife thought he may be aspirating as his coughing fits for lasting 10 to 15 minutes at a time.  Did not have any wheezing.  He does have shortness of breath that is chronic.  At this time he was not using his albuterol inhaler or nebulizer.  We ordered a chest x-ray and started him on azithromycin for concern of aspiration pneumonia.  Checks x-ray came back showing no active cardiopulmonary disease.  He reports that he continues to have a productive cough that some days is better but some days worse than it was previously.  His initial appointment with pulmonary is not for another month.  He also feels as though he is becoming more short of breath with exertion.  He has not had any fevers or chills  He is taking his PPI    Review of Systems See HPI   Past Medical History:  Diagnosis Date   Anal fissure    Anxiety    Arthritis    right wrist; pt. states "everywhere"   Asthma    Bipolar disorder (HCC)    Cataract    Depression    Difficult airway for intubation    GERD (gastroesophageal reflux disease)    Hemorrhage of colon following colonoscopy 10/08/2019   History of kidney stones    History of MRSA  infection    left knee after arthroplasty   Hyperlipidemia    Hypertension    states under control with med., has been on med. x 1 yr.   Impaired hearing    Left bundle branch block (LBBB) on electrocardiogram 10/29/2015   Macular degeneration (senile) of retina    left   Pre-diabetes     Social History   Socioeconomic History   Marital status: Married    Spouse name: Not on file   Number of children: Not on file   Years of education: Not on file   Highest education level: 12th grade  Occupational History   Not on file  Tobacco Use   Smoking status: Never   Smokeless tobacco: Never  Vaping Use   Vaping status: Never Used  Substance and Sexual Activity   Alcohol use: Not Currently    Comment: occasional.   Drug use: No   Sexual activity: Not Currently    Birth control/protection: None  Other Topics Concern   Not on file  Social History Narrative   Married, lives in [place].  Retired Quarry manager.   Two daughters, both married,3 grandchildren    No regular exercise.  Never smoker.  No ETOH.  No drugs.   Social Determinants of Health   Financial Resource Strain: Low Risk  (01/02/2023)   Overall Financial Resource Strain (CARDIA)    Difficulty of Paying Living Expenses: Not hard  at all  Food Insecurity: No Food Insecurity (01/02/2023)   Hunger Vital Sign    Worried About Running Out of Food in the Last Year: Never true    Ran Out of Food in the Last Year: Never true  Transportation Needs: No Transportation Needs (01/02/2023)   PRAPARE - Administrator, Civil Service (Medical): No    Lack of Transportation (Non-Medical): No  Physical Activity: Inactive (01/02/2023)   Exercise Vital Sign    Days of Exercise per Week: 0 days    Minutes of Exercise per Session: 0 min  Stress: Stress Concern Present (01/02/2023)   [employer] of Occupational Health - Occupational Stress Questionnaire    Feeling of Stress : Rather much  Social Connections: Moderately  Integrated (01/02/2023)   Social Connection and Isolation Panel [NHANES]    Frequency of Communication with Friends and Family: More than three times a week    Frequency of Social Gatherings with Friends and Family: Three times a week    Attends Religious Services: Never    Active Member of Clubs or Organizations: No    Attends Banker Meetings: More than 4 times per year    Marital Status: Married  Catering manager Violence: Not At Risk (09/26/2022)   Humiliation, Afraid, Rape, and Kick questionnaire    Fear of Current or Ex-Partner: No    Emotionally Abused: No    Physically Abused: No    Sexually Abused: No    Past Surgical History:  Procedure Laterality Date   ANKLE FUSION Bilateral    ANKLE SURGERY Bilateral    ligament surgery   BIOPSY  04/29/2020   Procedure: BIOPSY;  Surgeon: Shellia Cleverly, DO;  Location: [facility];  Service: Gastroenterology;;   BIOPSY  07/14/2022   Procedure: BIOPSY;  Surgeon: Shellia Cleverly, DO;  Location: [facility];  Service: Gastroenterology;;   CARDIAC CATHETERIZATION  x 2   1983; 11/14/2003   CARPOMETACARPEL SUSPENSION PLASTY Right 09/22/2016   Procedure: right thumb carpometacarpal  ARTHROPLASTY;  Surgeon: Betha Loa, MD;  Location: [facility];  Service: Orthopedics;  Laterality: Right;  right thumb carpometacarpal  ARTHROPLASTY   CARPOMETACARPEL SUSPENSION PLASTY Right 11/02/2017   Procedure: RIGHT THUMB SUSPENSIONPLASTY WITH TIGHTROPE, DISTAL POLE SCAPHOID  EXCISION;  Surgeon: Betha Loa, MD;  Location: [facility];  Service: Orthopedics;  Laterality: Right;   COLONOSCOPY WITH PROPOFOL N/A 10/02/2019   Procedure: COLONOSCOPY WITH PROPOFOL;  Surgeon: Shellia Cleverly, DO;  Location: [facility];  Service: Gastroenterology;  Laterality: N/A;   COLONOSCOPY WITH PROPOFOL N/A 10/09/2019   Procedure: COLONOSCOPY WITH PROPOFOL;  Surgeon: Iva Boop, MD;  Location: [facility];  Service:  Endoscopy;  Laterality: N/A;   COLONOSCOPY WITH PROPOFOL N/A 04/29/2020   Procedure: COLONOSCOPY WITH PROPOFOL;  Surgeon: Shellia Cleverly, DO;  Location: [facility];  Service: Gastroenterology;  Laterality: N/A;   COLONOSCOPY WITH PROPOFOL N/A 11/11/2021   Procedure: COLONOSCOPY WITH PROPOFOL;  Surgeon: Shellia Cleverly, DO;  Location: [facility];  Service: Gastroenterology;  Laterality: N/A;   ELBOW SURGERY Left    ENDOSCOPIC MUCOSAL RESECTION N/A 10/02/2019   Procedure: ENDOSCOPIC MUCOSAL RESECTION;  Surgeon: Shellia Cleverly, DO;  Location: [facility];  Service: Gastroenterology;  Laterality: N/A;   ESOPHAGOGASTRODUODENOSCOPY (EGD) WITH PROPOFOL N/A 07/14/2022   Procedure: ESOPHAGOGASTRODUODENOSCOPY (EGD) WITH PROPOFOL;  Surgeon: Shellia Cleverly, DO;  Location: [facility];  Service: Gastroenterology;  Laterality: N/A;   HEMOSTASIS CLIP PLACEMENT  10/09/2019  Procedure: HEMOSTASIS CLIP PLACEMENT;  Surgeon: Iva Boop, MD;  Location: [facility];  Service: Endoscopy;;   HEMOSTASIS CLIP PLACEMENT  11/11/2021   Procedure: HEMOSTASIS CLIP PLACEMENT;  Surgeon: Shellia Cleverly, DO;  Location: [facility];  Service: Gastroenterology;;   HEMOSTASIS CLIP PLACEMENT  07/14/2022   Procedure: HEMOSTASIS CLIP PLACEMENT;  Surgeon: Shellia Cleverly, DO;  Location: [facility];  Service: Gastroenterology;;   INGUINAL HERNIA REPAIR Right    KNEE SURGERY     OPEN REDUCTION INTERNAL FIXATION (ORIF) DISTAL RADIAL FRACTURE Right 10/29/2015   Procedure: OPEN REDUCTION INTERNAL FIXATION (ORIF) DISTAL RADIAL FRACTURE;  Surgeon: Betha Loa, MD;  Location: [facility];  Service: Orthopedics;  Laterality: Right;   ORIF ELBOW FRACTURE Right    POLYPECTOMY  04/29/2020   Procedure: POLYPECTOMY;  Surgeon: Shellia Cleverly, DO;  Location: [facility];  Service: Gastroenterology;;   POLYPECTOMY  11/11/2021   Procedure: POLYPECTOMY;  Surgeon: Shellia Cleverly, DO;  Location:  [facility];  Service: Gastroenterology;;   POLYPECTOMY  07/14/2022   Procedure: POLYPECTOMY;  Surgeon: Shellia Cleverly, DO;  Location: [facility];  Service: Gastroenterology;;   Gaspar Bidding DILATION N/A 07/14/2022   Procedure: SAVORY DILATION;  Surgeon: Shellia Cleverly, DO;  Location: [facility];  Service: Gastroenterology;  Laterality: N/A;   SUBMUCOSAL LIFTING INJECTION  10/02/2019   Procedure: SUBMUCOSAL LIFTING INJECTION;  Surgeon: Shellia Cleverly, DO;  Location: [facility];  Service: Gastroenterology;;   TONSILLECTOMY     TOTAL KNEE ARTHROPLASTY Bilateral    TOTAL KNEE REVISION Right 08/25/2021   Procedure: Right knee polyethylene vs total knee arthroplasty revision;  Surgeon: Ollen Gross, MD;  Location: [facility];  Service: Orthopedics;  Laterality: Right;   ULNAR COLLATERAL LIGAMENT REPAIR Right 12/29/2015   Procedure: REPAIR  RIGHT LATERAL ULNAR COLLATERAL LIGAMENT TEAR  EXTENSOR ORIGIN ;  Surgeon: Betha Loa, MD;  Location: [facility];  Service: Orthopedics;  Laterality: Right;   WRIST ARTHROSCOPY WITH DEBRIDEMENT Right 07/14/2016   Procedure: RIGHT WRIST ARTHROSCOPY WITH DEBRIDEMENT  TRIANGULAR FIBROCARTILAGE COMPLEX;  Surgeon: Betha Loa, MD;  Location:  [facility];  Service: Orthopedics;  Laterality: Right;    Family History  Problem Relation Age of Onset   Alcohol abuse Mother    Ovarian cancer Mother    Alcohol abuse Father    Pancreatic cancer Father    Hyperlipidemia Brother    Barrett's esophagus Brother    Colon cancer Maternal Grandmother    Depression Daughter    Paranoid behavior Daughter    Esophageal cancer Neg Hx    Stomach cancer Neg Hx    Rectal cancer Neg Hx    Colon polyps Neg Hx     Allergies  Allergen Reactions   Penicillins Other (See Comments)    BLISTERS Did it involve swelling of the face/tongue/throat, SOB, or low BP? No Did it involve sudden or severe rash/hives, skin peeling, or any reaction on the  inside of your mouth or nose? No Did you need to seek medical attention at a hospital or doctor's office? No When did it last happen? around 1980    If all above answers are "NO", may proceed with cephalosporin use. Tolerated Cephalosporin Date: 08/26/21.     Lisinopril Diarrhea and Cough    Dry cough, abdominal bloating and diarrhea   Zetia [Ezetimibe]     Myalgia    Codeine Rash    Current Outpatient Medications on File Prior to Visit  Medication Sig Dispense Refill  albuterol (VENTOLIN HFA) 108 (90 Base) MCG/ACT inhaler Inhale 2 puffs into the lungs every 6 (six) hours as needed for wheezing or shortness of breath. 6.7 g 3   ARIPiprazole ER (ABILIFY MAINTENA) 400 MG PRSY prefilled syringe Inject 400 mg into the muscle every 28 (twenty-eight) days. 1 each 2   azelastine (ASTELIN) 0.1 % nasal spray Place 2 sprays into both nostrils 2 (two) times daily. Use in each nostril as directed 30 mL 6   cetirizine (ZYRTEC) 10 MG tablet Take 10 mg by mouth every morning.     citalopram (CELEXA) 20 MG tablet Take 1 tablet (20 mg total) by mouth daily. 90 tablet 0   Continuous Blood Gluc Receiver (FREESTYLE LIBRE 2 READER) DEVI Use with libre sensors 1 each 0   Continuous Blood Gluc Sensor (FREESTYLE LIBRE 2 SENSOR) MISC 1 Device by Does not apply route every 14 (fourteen) days. 2 each 11   fenofibrate (TRICOR) 145 MG tablet TAKE ONE TABLET BY MOUTH ONCE DAILY 90 tablet 3   gabapentin (NEURONTIN) 100 MG capsule Take one capsule (100 mg total) by mouth twice daily. 180 capsule 0   HYDROcodone bit-homatropine (HYCODAN) 5-1.5 MG/5ML syrup Take 5 mLs by mouth every 8 (eight) hours as needed for cough. 120 mL 0   ipratropium-albuterol (DUONEB) 0.5-2.5 (3) MG/3ML SOLN Take 3 mLs by nebulization every 6 (six) hours as needed. 360 mL 0   losartan (COZAAR) 50 MG tablet TAKE ONE TABLET BY MOUTH EVERY MORNING 90 tablet 3   metFORMIN (GLUCOPHAGE) 500 MG tablet TAKE ONE TABLET BY MOUTH ONCE DAILY 90 tablet 0    metoprolol tartrate (LOPRESSOR) 100 MG tablet Take 1 tablet by mouth once for procedure. 1 tablet 0   Multiple Vitamin (MULTIVITAMIN WITH MINERALS) TABS tablet Take 1 tablet by mouth daily. 50 +     Multiple Vitamins-Minerals (PRESERVISION AREDS 2 PO) Take 1 capsule by mouth 2 (two) times daily.     naproxen sodium (ALEVE) 220 MG tablet Take 220 mg by mouth daily as needed (pain).     pantoprazole (PROTONIX) 40 MG tablet TAKE ONE TABLET BY MOUTH TWICE DAILY 180 tablet 1   pravastatin (PRAVACHOL) 10 MG tablet TAKE ONE TABLET BY MOUTH every other DAY 45 tablet 3   temazepam (RESTORIL) 15 MG capsule Take 1 capsule (15 mg total) by mouth at bedtime. 90 capsule 0   VASCEPA 1 g capsule TAKE TWO CAPSULES BY MOUTH TWICE DAILY 360 capsule 3   Vitamin D, Cholecalciferol, 1000 UNITS TABS Take 1,000 Units by mouth daily.      Current Facility-Administered Medications on File Prior to Visit  Medication Dose Route Frequency Provider Last Rate Last Admin   ARIPiprazole ER (ABILIFY MAINTENA) 400 MG prefilled syringe 400 mg  400 mg Intramuscular Q28 days Arfeen, Phillips Grout, MD   400 mg at 03/16/23 1445    BP 122/62   Pulse 98   Temp 97.9 F (36.6 C) (Oral)   Ht 5\' 7"  (1.702 m)   Wt 270 lb (122.5 kg)   SpO2 94%   BMI 42.29 kg/m       Objective:   Physical Exam Vitals and nursing note reviewed.  Constitutional:      Appearance: Normal appearance.  Cardiovascular:     Rate and Rhythm: Normal rate and regular rhythm.     Pulses: Normal pulses.     Heart sounds: Normal heart sounds.  Pulmonary:     Effort: Pulmonary effort is normal.  Breath sounds: Normal breath sounds.  Musculoskeletal:        General: Normal range of motion.  Skin:    General: Skin is warm and dry.     Capillary Refill: Capillary refill takes less than 2 seconds.  Neurological:     General: No focal deficit present.     Mental Status: He is alert and oriented to person, place, and time.  Psychiatric:        Mood and  Affect: Mood normal.        Behavior: Behavior normal.        Thought Content: Thought content normal.        Judgment: Judgment normal.       Assessment & Plan:  1. Chronic cough -No concern for pneumonia.  Will start on Symbicort and provide 7-day course of prednisone.  He can continue to use his nebulizer and albuterol inhaler as needed. - budesonide-formoterol (SYMBICORT) 80-4.5 MCG/ACT inhaler; Inhale 2 puffs into the lungs 2 (two) times daily.  Dispense: 1 each; Refill: 3 - predniSONE (DELTASONE) 20 MG tablet; Take 1 tablet (20 mg total) by mouth daily with breakfast.  Dispense: 7 tablet; Refill: 0  Shirline Frees, NP

## 2023-04-20 ENCOUNTER — Encounter (HOSPITAL_COMMUNITY): Payer: Self-pay

## 2023-04-20 ENCOUNTER — Other Ambulatory Visit: Payer: Self-pay | Admitting: Adult Health

## 2023-04-20 ENCOUNTER — Encounter: Payer: Self-pay | Admitting: Adult Health

## 2023-04-20 ENCOUNTER — Other Ambulatory Visit: Payer: Self-pay

## 2023-04-20 ENCOUNTER — Ambulatory Visit (HOSPITAL_BASED_OUTPATIENT_CLINIC_OR_DEPARTMENT_OTHER): Payer: PPO

## 2023-04-20 VITALS — BP 121/74 | HR 78 | Ht 69.0 in | Wt 270.0 lb

## 2023-04-20 DIAGNOSIS — F319 Bipolar disorder, unspecified: Secondary | ICD-10-CM | POA: Diagnosis not present

## 2023-04-20 DIAGNOSIS — F3131 Bipolar disorder, current episode depressed, mild: Secondary | ICD-10-CM

## 2023-04-20 MED ORDER — FLUTICASONE-SALMETEROL 100-50 MCG/ACT IN AEPB: 1.0000 | INHALATION_SPRAY | Freq: Two times a day (BID) | RESPIRATORY_TRACT | 3 refills | Status: DC

## 2023-04-20 NOTE — Progress Notes (Signed)
Patient presented today for his due injection of Abilify Maintena 400 mg IM. Patient was pleasant and cooperative. Patient has been battling a cough that he is seeing his PCP about. Patient denies any SI/HI or AVH at this time. Injection was prepared as ordered and given in patients UROQ. Patient tolerated without complaint. Patient has no questions at this time and will return in 28 days for his next due injection  NDC: 59148-072-92 LOT: [id number] EXP: DEC 2026

## 2023-04-20 NOTE — Telephone Encounter (Signed)
Please advise 

## 2023-04-23 ENCOUNTER — Other Ambulatory Visit: Payer: Self-pay | Admitting: Gastroenterology

## 2023-04-23 ENCOUNTER — Other Ambulatory Visit: Payer: Self-pay | Admitting: Adult Health

## 2023-04-23 DIAGNOSIS — I10 Essential (primary) hypertension: Secondary | ICD-10-CM

## 2023-04-26 ENCOUNTER — Other Ambulatory Visit: Payer: Self-pay | Admitting: Adult Health

## 2023-04-26 DIAGNOSIS — R053 Chronic cough: Secondary | ICD-10-CM

## 2023-04-27 ENCOUNTER — Telehealth (HOSPITAL_BASED_OUTPATIENT_CLINIC_OR_DEPARTMENT_OTHER): Payer: PPO | Admitting: Psychiatry

## 2023-04-27 ENCOUNTER — Encounter (HOSPITAL_COMMUNITY): Payer: Self-pay | Admitting: Psychiatry

## 2023-04-27 VITALS — Wt 270.0 lb

## 2023-04-27 DIAGNOSIS — F319 Bipolar disorder, unspecified: Secondary | ICD-10-CM

## 2023-04-27 DIAGNOSIS — F411 Generalized anxiety disorder: Secondary | ICD-10-CM | POA: Diagnosis not present

## 2023-04-27 MED ORDER — TEMAZEPAM 15 MG PO CAPS
15.0000 mg | ORAL_CAPSULE | Freq: Every day | ORAL | 0 refills | Status: DC
Start: 2023-04-27 — End: 2023-07-28

## 2023-04-27 MED ORDER — GABAPENTIN 100 MG PO CAPS
ORAL_CAPSULE | ORAL | 0 refills | Status: DC
Start: 2023-04-27 — End: 2023-04-27

## 2023-04-27 MED ORDER — CITALOPRAM HYDROBROMIDE 20 MG PO TABS
20.0000 mg | ORAL_TABLET | Freq: Every day | ORAL | 0 refills | Status: DC
Start: 2023-04-27 — End: 2023-10-23

## 2023-04-27 MED ORDER — ARIPIPRAZOLE ER 400 MG IM PRSY
400.0000 mg | PREFILLED_SYRINGE | INTRAMUSCULAR | Status: DC
Start: 2023-04-27 — End: 2023-07-28

## 2023-04-27 MED ORDER — GABAPENTIN 100 MG PO CAPS
ORAL_CAPSULE | ORAL | 0 refills | Status: DC
Start: 2023-04-27 — End: 2023-07-28

## 2023-04-27 MED ORDER — TEMAZEPAM 15 MG PO CAPS
15.0000 mg | ORAL_CAPSULE | Freq: Every day | ORAL | 0 refills | Status: DC
Start: 2023-04-27 — End: 2023-04-27

## 2023-04-27 MED ORDER — CITALOPRAM HYDROBROMIDE 20 MG PO TABS: 20.0000 mg | ORAL_TABLET | Freq: Every day | ORAL | 0 refills | Status: DC

## 2023-04-27 NOTE — Progress Notes (Signed)
[facility] MD Virtual Progress Note   Patient Location: Home  Provider Location: Home Office  I connect with patient by telephone and verified that I am speaking with correct person by using two identifiers. I discussed the limitations of evaluation and management by telemedicine and the availability of in person appointments. I also discussed with the patient that there may be a patient responsible charge related to this service. The patient expressed understanding and agreed to proceed.  Sean Hill [id number] 82 y.o.  04/27/2023 10:26 AM  History of Present Illness:  Patient is evaluated by phone session.  He went to beach and after came back he reported having chest infection.  He did twice antibiotic and steroids.  He is slowly and gradually getting better.  He reported his mental health is stable and he denies any mania, psychosis, hallucination.  He denies any impulsive behavior.  He sleeps good with the help of temazepam.  He is also compliant with Abilify injection, Celexa and gabapentin.  He does not drive and his wife takes him to the doctor's appointment.  He denies drinking or using any illegal substances.  He is in therapy with Jasmine December once a month.  He does not want to change the medication.  Past Psychiatric History: H/O multiple hospitalization.  Last inpatient in July 2014. H/O overdose on Latuda with alcohol. H/O cutting wrist.  Took Tegretol, Depakote, Ambien, Remeron, Vistaril, Geodon, Abilify, lithium, Provigil, Zoloft, Neurontin, Lamictal, Wellbutrin, Risperdal and Pristiq. H/O mania, aggression, getting speeding tickets, excessive buying and impulsive behavior    Outpatient Encounter Medications as of 04/27/2023  Medication Sig   albuterol (VENTOLIN HFA) 108 (90 Base) MCG/ACT inhaler Inhale 2 puffs into the lungs every 6 (six) hours as needed for wheezing or shortness of breath.   ARIPiprazole ER (ABILIFY MAINTENA) 400 MG PRSY prefilled syringe Inject  400 mg into the muscle every 28 (twenty-eight) days.   azelastine (ASTELIN) 0.1 % nasal spray Place 2 sprays into both nostrils 2 (two) times daily. Use in each nostril as directed   cetirizine (ZYRTEC) 10 MG tablet Take 10 mg by mouth every morning.   citalopram (CELEXA) 20 MG tablet Take 1 tablet (20 mg total) by mouth daily.   Continuous Blood Gluc Receiver (FREESTYLE LIBRE 2 READER) DEVI Use with libre sensors   Continuous Blood Gluc Sensor (FREESTYLE LIBRE 2 SENSOR) MISC 1 Device by Does not apply route every 14 (fourteen) days.   fenofibrate (TRICOR) 145 MG tablet TAKE ONE TABLET BY MOUTH ONCE DAILY   fluticasone-salmeterol (ADVAIR) 100-50 MCG/ACT AEPB Inhale 1 puff into the lungs 2 (two) times daily.   gabapentin (NEURONTIN) 100 MG capsule Take one capsule (100 mg total) by mouth twice daily.   HYDROcodone bit-homatropine (HYCODAN) 5-1.5 MG/5ML syrup Take 5 mLs by mouth every 8 (eight) hours as needed for cough.   ipratropium-albuterol (DUONEB) 0.5-2.5 (3) MG/3ML SOLN INHALE 3 ML BY NEBULIZER EVERY 6 HOURS AS NEEDED   losartan (COZAAR) 50 MG tablet TAKE ONE TABLET BY MOUTH EVERY MORNING   metFORMIN (GLUCOPHAGE) 500 MG tablet TAKE ONE TABLET BY MOUTH ONCE DAILY   metoprolol tartrate (LOPRESSOR) 100 MG tablet Take 1 tablet by mouth once for procedure.   Multiple Vitamin (MULTIVITAMIN WITH MINERALS) TABS tablet Take 1 tablet by mouth daily. 50 +   Multiple Vitamins-Minerals (PRESERVISION AREDS 2 PO) Take 1 capsule by mouth 2 (two) times daily.   naproxen sodium (ALEVE) 220 MG tablet Take 220 mg by mouth daily as needed (  pain).   pantoprazole (PROTONIX) 40 MG tablet Take 1 tablet (40 mg total) by mouth 2 (two) times daily. Please call [phone] to schedule an office visit for more refills.   pravastatin (PRAVACHOL) 10 MG tablet TAKE ONE TABLET BY MOUTH every other DAY   predniSONE (DELTASONE) 20 MG tablet Take 1 tablet (20 mg total) by mouth daily with breakfast.   temazepam (RESTORIL) 15  MG capsule Take 1 capsule (15 mg total) by mouth at bedtime.   VASCEPA 1 g capsule TAKE TWO CAPSULES BY MOUTH TWICE DAILY   Vitamin D, Cholecalciferol, 1000 UNITS TABS Take 1,000 Units by mouth daily.    Facility-Administered Encounter Medications as of 04/27/2023  Medication   ARIPiprazole ER (ABILIFY MAINTENA) 400 MG prefilled syringe 400 mg    No results found for this or any previous visit (from the past 2160 hour(s)).   Psychiatric Specialty Exam: Physical Exam  Review of Systems  Weight 270 lb (122.5 kg).There is no height or weight on file to calculate BMI.  General Appearance: NA  Eye Contact:  NA  Speech:  Slow  Volume:  Decreased  Mood:  Euthymic  Affect:  NA  Thought Process:  Goal Directed  Orientation:  Full (Time, Place, and Person)  Thought Content:  Logical  Suicidal Thoughts:  No  Homicidal Thoughts:  No  Memory:  Immediate;   Fair Recent;   Fair Remote;   Fair  Judgement:  Intact  Insight:  Present  Psychomotor Activity:  NA  Concentration:  Concentration: Fair and Attention Span: Fair  Recall:  Fair  Fund of Knowledge:  Good  Language:  Good  Akathisia:  No  Handed:  Right  AIMS (if indicated):     Assets:  Communication Skills Desire for Improvement Housing Social Support  ADL's:  Intact  Cognition:  WNL  Sleep: Okay     Assessment/Plan: Bipolar I disorder (HCC) - Plan: ARIPiprazole ER (ABILIFY MAINTENA) 400 MG PRSY prefilled syringe, gabapentin (NEURONTIN) 100 MG capsule, temazepam (RESTORIL) 15 MG capsule, DISCONTINUED: temazepam (RESTORIL) 15 MG capsule, DISCONTINUED: gabapentin (NEURONTIN) 100 MG capsule  GAD (generalized anxiety disorder) - Plan: citalopram (CELEXA) 20 MG tablet, gabapentin (NEURONTIN) 100 MG capsule, DISCONTINUED: gabapentin (NEURONTIN) 100 MG capsule, DISCONTINUED: citalopram (CELEXA) 20 MG tablet  Patient is stable on his current medication.  Continue gabapentin 100 mg twice a day, Celexa 20 mg daily, temazepam 15 mg at  bedtime and Abilify injection 400 mg intramuscular every 28 days.  Recommend to call us back with any question or any concern.  Encouraged to continue therapy with Jasmine December.  Follow-up in 3 months   Follow Up Instructions:     I discussed the assessment and treatment plan with the patient. The patient was provided an opportunity to ask questions and all were answered. The patient agreed with the plan and demonstrated an understanding of the instructions.   The patient was advised to call back or seek an in-person evaluation if the symptoms worsen or if the condition fails to improve as anticipated.    Collaboration of Care: Other provider involved in patient's care AEB notes are available in epic to review  Patient/Guardian was advised Release of Information must be obtained prior to any record release in order to collaborate their care with an outside provider. Patient/Guardian was advised if they have not already done so to contact the registration department to sign all necessary forms in order for [place] to release information regarding their care.   Consent: Patient/Guardian gives  verbal consent for treatment and assignment of benefits for services provided during this visit. Patient/Guardian expressed understanding and agreed to proceed.     I provided 14 minutes of non face to face time during this encounter.  Note: This document was prepared by [employer] voice dictation technology and any errors that results from this process are unintentional.    Cleotis Nipper, MD 04/27/2023

## 2023-05-02 DIAGNOSIS — M5459 Other low back pain: Secondary | ICD-10-CM | POA: Diagnosis not present

## 2023-05-02 DIAGNOSIS — M5416 Radiculopathy, lumbar region: Secondary | ICD-10-CM | POA: Diagnosis not present

## 2023-05-02 DIAGNOSIS — M48061 Spinal stenosis, lumbar region without neurogenic claudication: Secondary | ICD-10-CM | POA: Diagnosis not present

## 2023-05-11 ENCOUNTER — Other Ambulatory Visit: Payer: Self-pay | Admitting: Medical Genetics

## 2023-05-11 ENCOUNTER — Encounter: Payer: Self-pay | Admitting: Adult Health

## 2023-05-11 DIAGNOSIS — Z006 Encounter for examination for normal comparison and control in clinical research program: Secondary | ICD-10-CM

## 2023-05-11 NOTE — Telephone Encounter (Signed)
Can I send this in  ?

## 2023-05-12 NOTE — Telephone Encounter (Signed)
FYI

## 2023-05-16 ENCOUNTER — Other Ambulatory Visit: Payer: Self-pay | Admitting: Adult Health

## 2023-05-16 DIAGNOSIS — M5459 Other low back pain: Secondary | ICD-10-CM | POA: Diagnosis not present

## 2023-05-16 MED ORDER — MIRABEGRON ER 50 MG PO TB24: 50.0000 mg | ORAL_TABLET | Freq: Every day | ORAL | 3 refills | Status: DC

## 2023-05-17 ENCOUNTER — Ambulatory Visit (HOSPITAL_BASED_OUTPATIENT_CLINIC_OR_DEPARTMENT_OTHER): Payer: PPO

## 2023-05-17 ENCOUNTER — Telehealth: Payer: Self-pay | Admitting: Adult Health

## 2023-05-17 ENCOUNTER — Telehealth: Payer: Self-pay

## 2023-05-17 VITALS — BP 119/72 | HR 87 | Ht 69.0 in | Wt 274.0 lb

## 2023-05-17 DIAGNOSIS — F319 Bipolar disorder, unspecified: Secondary | ICD-10-CM

## 2023-05-17 NOTE — Telephone Encounter (Signed)
Myrbetriq was over 100.00--pt is wanting a call back about possibilities with medication.

## 2023-05-17 NOTE — Progress Notes (Signed)
Patient arrived today for his due injection of Abilify Maintena 400 mg. Patient is pleasant and well groomed per his usual presentation. He denied SI/HI or AVH. Patient is going to spend some time at the beach next month and is looking forward to this. Injection was prepared as ordered and given in patients LUOQ. Patient tolerated well and without complaint. Patient will return in 28 days for his next due injection.  NDC: 16109-604-54 LOT: [id number] EXP: DEC 2026

## 2023-05-17 NOTE — Progress Notes (Signed)
   05/17/2023  Patient ID: Sean Hill, male   DOB: 10/20/1940, 82 y.o.   MRN: [id number]  S/O Telephone visit to assist with affordability of Myrbetric 50mg   Medication Access/Adherence -Patient stating 3 month refill was going to cost >$100, which he could not afford -There is no longer a patient assistance program for this medication, as it ended in July of this year -Patient does not qualify for LIS based on [facility] -Discussed trying to get a tier reduction with his insurance plan, but this would put him in the donut hole sooner  A/P  Medication Access/Adherence -Patient endorses ability to fill monthly for $47 copay- will do this through the end of the year and will reach out if anything changes  Follow-up:  I will follow-up after the beginning of the year to make sure continues to be affordable  Lenna Gilford, PharmD, DPLA

## 2023-05-23 DIAGNOSIS — M5459 Other low back pain: Secondary | ICD-10-CM | POA: Diagnosis not present

## 2023-05-23 NOTE — Progress Notes (Addendum)
Sean Hill, male    DOB: 1941-07-13   MRN: [id number]   Brief patient profile:  70  yowm never smoker no resp problems as child then around 1972 bad cough in setting of uri had to go to ER and same pattern every 2 years or so and fine in between  while being exposed at work to [employer] (like kerosene) but stopped in 1980s and seemed some better  though continued similar pattern ? Worse in fall  and  2021 christmas 2023 same problem "came and stayed" so referred by Elio Forget NP for refractory cough with some clinical concern for recurrent asp since around 2000    History of Present Illness  05/24/2023  Pulmonary/ 1st office eval/Sean Hill maint on advair  Chief Complaint  Patient presents with   Consult    SOB with exertion, chronic cough x 6 months.    Dyspnea:  limited by back more than breathing/ steps are a struggle with but can do s stopping .  Cough: 1st thing in am / mucus is clear today but it's variably been brown assoc with nasal congestion same color  Sleep: flat bed, 2 pillows  SABA use: has nebulizer doesn't really help 02 none/ nor cpap Dysphagia coughs p  food once a week esp rice  No obvious other day to day or daytime pattern/variability or assoc  mucus plugs or hemoptysis or cp or chest tightness, subjective wheeze or overt  hb symptoms.    Also denies any obvious fluctuation of symptoms with weather or environmental changes or other aggravating or alleviating factors except as outlined above   No unusual exposure hx or h/o childhood pna/ asthma or knowledge of premature birth.  Current Allergies, Complete Past Medical History, Past Surgical History, Family History, and Social History were reviewed in [employer] record.  ROS  The following are not active complaints unless bolded Hoarseness, sore throat, dysphagia, dental problems, itching, sneezing,  nasal congestion or discharge of excess mucus or purulent secretions, ear ache,   fever,  chills, sweats, unintended wt loss or wt gain, classically pleuritic or exertional cp,  orthopnea pnd or arm/hand swelling  or leg swelling, presyncope, palpitations, abdominal pain, anorexia, nausea, vomiting, diarrhea  or change in bowel habits or change in bladder habits, change in stools or change in urine, dysuria, hematuria,  rash, arthralgias, visual complaints, headache, numbness, weakness or ataxia or problems with walking or coordination,  change in mood or  memory.              Outpatient Medications Prior to Visit  Medication Sig Dispense Refill   albuterol (VENTOLIN HFA) 108 (90 Base) MCG/ACT inhaler Inhale 2 puffs into the lungs every 6 (six) hours as needed for wheezing or shortness of breath. 6.7 g 3   ARIPiprazole ER (ABILIFY MAINTENA) 400 MG PRSY prefilled syringe Inject 400 mg into the muscle every 28 (twenty-eight) days.     azelastine (ASTELIN) 0.1 % nasal spray Place 2 sprays into both nostrils 2 (two) times daily. Use in each nostril as directed 30 mL 6   cetirizine (ZYRTEC) 10 MG tablet Take 10 mg by mouth every morning.     citalopram (CELEXA) 20 MG tablet Take 1 tablet (20 mg total) by mouth daily. 90 tablet 0   Continuous Blood Gluc Receiver (FREESTYLE LIBRE 2 READER) DEVI Use with libre sensors 1 each 0   Continuous Blood Gluc Sensor (FREESTYLE LIBRE 2 SENSOR) MISC 1 Device by Does not apply  route every 14 (fourteen) days. 2 each 11   fenofibrate (TRICOR) 145 MG tablet TAKE ONE TABLET BY MOUTH ONCE DAILY 90 tablet 0   fluticasone-salmeterol (ADVAIR) 100-50 MCG/ACT AEPB Inhale 1 puff into the lungs 2 (two) times daily. 1 each 3   gabapentin (NEURONTIN) 100 MG capsule Take one capsule (100 mg total) by mouth twice daily. 180 capsule 0   HYDROcodone bit-homatropine (HYCODAN) 5-1.5 MG/5ML syrup Take 5 mLs by mouth every 8 (eight) hours as needed for cough. 120 mL 0   ipratropium-albuterol (DUONEB) 0.5-2.5 (3) MG/3ML SOLN INHALE 3 ML BY NEBULIZER EVERY 6 HOURS AS NEEDED 360  mL 0   losartan (COZAAR) 50 MG tablet TAKE ONE TABLET BY MOUTH EVERY MORNING 90 tablet 0   metFORMIN (GLUCOPHAGE) 500 MG tablet TAKE ONE TABLET BY MOUTH ONCE DAILY 90 tablet 0       0   mirabegron ER (MYRBETRIQ) 50 MG TB24 tablet Take 1 tablet (50 mg total) by mouth daily. 90 tablet 3   Multiple Vitamin (MULTIVITAMIN WITH MINERALS) TABS tablet Take 1 tablet by mouth daily. 50 +     Multiple Vitamins-Minerals (PRESERVISION AREDS 2 PO) Take 1 capsule by mouth 2 (two) times daily.     naproxen sodium (ALEVE) 220 MG tablet Take 220 mg by mouth daily as needed (pain).     pantoprazole (PROTONIX) 40 MG tablet Take 1 tablet (40 mg total) by mouth 2 (two) times daily. Please call [phone] to schedule an office visit for more refills. 180 tablet 0   pravastatin (PRAVACHOL) 10 MG tablet TAKE ONE TABLET BY MOUTH every other DAY 45 tablet 3   temazepam (RESTORIL) 15 MG capsule Take 1 capsule (15 mg total) by mouth at bedtime. 90 capsule 0   VASCEPA 1 g capsule TAKE TWO CAPSULES BY MOUTH TWICE DAILY 360 capsule 3   Vitamin D, Cholecalciferol, 1000 UNITS TABS Take 1,000 Units by mouth daily.      predniSONE (DELTASONE) 20 MG tablet  Helped last took June 2024         Past Medical History:  Diagnosis Date   Anal fissure    Anxiety    Arthritis    right wrist; pt. states "everywhere"   Asthma    Bipolar disorder (HCC)    Cataract    Depression    Difficult airway for intubation    GERD (gastroesophageal reflux disease)    Hemorrhage of colon following colonoscopy 10/08/2019   History of kidney stones    History of MRSA infection    left knee after arthroplasty   Hyperlipidemia    Hypertension    states under control with med., has been on med. x 1 yr.   Impaired hearing    Left bundle branch block (LBBB) on electrocardiogram 10/29/2015   Macular degeneration (senile) of retina    left   Pre-diabetes       Objective:     BP 118/64 (BP Location: Left Arm, Patient Position: Sitting,  Cuff Size: Large)   Pulse 89   Temp 98 F (36.7 C) (Oral)   Ht 5\' 9"  (1.753 m)   Wt 272 lb 9.6 oz (123.7 kg)   SpO2 96%   BMI 40.26 kg/m   SpO2: 96 % RA  Pleasant MO (by BMI) amb wm nad / minimal pseudowheeze/ resolves with plm   HEENT : Oropharynx  clear/ poor dentition     Nasal turbinates non-specific edema s polyps   NECK :  without  apparent  JVD/ palpable Nodes/TM    LUNGS: no acc muscle use,  Nl contour chest which is clear to A and P bilaterally without cough on insp or exp maneuvers   CV:  RRR  no s3 or murmur or increase in P2, and trace bilateral pitting LE  edema   ABD:  obese soft and nontender with limited  inspiratory excursion    MS:  Nl gait/ ext warm without deformities Or obvious joint restrictions  calf tenderness, cyanosis or clubbing    SKIN: warm and dry without lesions    NEURO:  alert, approp, nl sensorium with  no motor or cerebellar deficits apparent.      Ct sinuses 05/23/21  nl   CXR PA and Lateral:   05/24/2023 :    I personally reviewed images and impression is as follows:     Minimal increase in markings, non - specific, some basilar atx not really appreciated on lateral view so may just all be due to obesity effects over lower lung fields     Assessment   Upper airway cough syndrome Onset 1972 worse since dec 2023 assoc with dysphagia  - sinus CT 06/19/23 - MBS  06/19/23   Upper airway cough syndrome (previously labeled PNDS),  is so named because it's frequently impossible to sort out how much is  CR/sinusitis with freq throat clearing (which can be related to primary GERD)   vs  causing  secondary (" extra esophageal")  GERD from wide swings in gastric pressure that occur with throat clearing, often  promoting self use of mint and menthol lozenges that reduce the lower esophageal sphincter tone and exacerbate the problem further in a cyclical fashion.   These are the same pts (now being labeled as having "irritable larynx syndrome" by  some cough centers) who not infrequently have a history of having failed to tolerate ace inhibitors,  dry powder inhalers(esp advair)  or biphosphonates or report having atypical/extraesophageal reflux symptoms that don't respond to standard doses of PPI  and are easily confused as having aecopd or asthma flares by even experienced allergists/ pulmonologists (myself included).   Will d/c advair dpi, optimize rx for gerd  and pursue w/u for source and rx omnicef (not pcn anaphylactic so low risk) 300 mg bid x 10 day prior to sinus ct and returning for MBS  Discussed in detail all the  indications, usual  risks and alternatives  relative to the benefits with patient who agrees to proceed with w/u and rx  as outlined.     DOE (dyspnea on exertion) Onset ? 2000  - PFTs 06/08/22  wnl  - 05/24/2023 @ 272 lbs   Walked on RA  x  2  lap(s) =  approx 500  ft  @ mod  pace, stopped due to doe/fatigue with lowest 02 sats 95%   Doe labs pending   Most likely this is deconditioning assocw with wt gain / no evidence of airflow obst of any type so try off Adair dpi due to concerns for uacs (see separate a/p) and use neb prn   Each maintenance medication was reviewed in detail including emphasizing most importantly the difference between maintenance and prns and under what circumstances the prns are to be triggered using an action plan format where appropriate.  Total time for H and P, chart review, counseling, reviewing dpi/hfa/ device(s) , directly observing portions of ambulatory 02 saturation study/ and generating customized AVS unique to this office visit / same day charting =  60 min new pt eval              Sandrea Hughs, MD 05/24/2023

## 2023-05-24 ENCOUNTER — Encounter: Payer: Self-pay | Admitting: Internal Medicine

## 2023-05-24 ENCOUNTER — Ambulatory Visit (INDEPENDENT_AMBULATORY_CARE_PROVIDER_SITE_OTHER): Payer: PPO

## 2023-05-24 ENCOUNTER — Ambulatory Visit: Payer: PPO | Admitting: Internal Medicine

## 2023-05-24 VITALS — BP 118/64 | HR 89 | Temp 98.0°F | Ht 69.0 in | Wt 272.6 lb

## 2023-05-24 DIAGNOSIS — R058 Other specified cough: Secondary | ICD-10-CM | POA: Insufficient documentation

## 2023-05-24 DIAGNOSIS — R0609 Other forms of dyspnea: Secondary | ICD-10-CM

## 2023-05-24 DIAGNOSIS — R1319 Other dysphagia: Secondary | ICD-10-CM

## 2023-05-24 DIAGNOSIS — R059 Cough, unspecified: Secondary | ICD-10-CM | POA: Diagnosis not present

## 2023-05-24 LAB — BASIC METABOLIC PANEL
BUN: 15 mg/dL (ref 6–23)
CO2: 26 meq/L (ref 19–32)
Calcium: 9.5 mg/dL (ref 8.4–10.5)
Chloride: 106 meq/L (ref 96–112)
Creatinine, Ser: 1.1 mg/dL (ref 0.40–1.50)
GFR: 63.42 mL/min (ref >60.00)
Glucose, Bld: 79 mg/dL (ref 70–99)
Potassium: 4.5 meq/L (ref 3.5–5.1)
Sodium: 142 meq/L (ref 135–145)

## 2023-05-24 LAB — CBC WITH DIFFERENTIAL/PLATELET
Basophils Absolute: 0 10*3/uL (ref 0.0–0.1)
Basophils Relative: 0.7 % (ref 0.0–3.0)
Eosinophils Absolute: 0.1 10*3/uL (ref 0.0–0.7)
Eosinophils Relative: 2.2 % (ref 0.0–5.0)
HCT: 43.8 % (ref 39.0–52.0)
Hemoglobin: 14.2 g/dL (ref 13.0–17.0)
Lymphocytes Relative: 48.3 % — ABNORMAL HIGH (ref 12.0–46.0)
Lymphs Abs: 1.9 10*3/uL (ref 0.7–4.0)
MCHC: 32.5 g/dL (ref 30.0–36.0)
MCV: 88.9 fl (ref 78.0–100.0)
Monocytes Absolute: 0.4 10*3/uL (ref 0.1–1.0)
Monocytes Relative: 11.1 % (ref 3.0–12.0)
Neutro Abs: 1.5 10*3/uL (ref 1.4–7.7)
Neutrophils Relative %: 37.7 % — ABNORMAL LOW (ref 43.0–77.0)
Platelets: 243 10*3/uL (ref 150.0–400.0)
RBC: 4.92 Mil/uL (ref 4.22–5.81)
RDW: 14.6 % (ref 11.5–15.5)
WBC: 3.9 10*3/uL — ABNORMAL LOW (ref 4.0–10.5)

## 2023-05-24 LAB — BRAIN NATRIURETIC PEPTIDE: Pro B Natriuretic peptide (BNP): 41 pg/mL (ref 0.0–100.0)

## 2023-05-24 LAB — TSH: TSH: 1.51 u[IU]/mL (ref 0.35–5.50)

## 2023-05-24 LAB — SEDIMENTATION RATE: Sed Rate: 5 mm/h (ref 0–20)

## 2023-05-24 MED ORDER — PREDNISONE 10 MG PO TABS
ORAL_TABLET | ORAL | 0 refills | Status: DC
Start: 2023-05-24 — End: 2023-09-06

## 2023-05-24 MED ORDER — CEFDINIR 300 MG PO CAPS
300.0000 mg | ORAL_CAPSULE | Freq: Two times a day (BID) | ORAL | 0 refills | Status: DC
Start: 2023-05-24 — End: 2023-09-06

## 2023-05-24 NOTE — Assessment & Plan Note (Addendum)
Onset ? 2000  - PFTs 06/08/22  wnl  - 05/24/2023 @ 272 lbs   Walked on RA  x  2  lap(s) =  approx 500  ft  @ mod  pace, stopped due to doe/fatigue with lowest 02 sats 95%   Doe labs pending   Most likely this is deconditioning assocw with wt gain / no evidence of airflow obst of any type so try off Adair dpi due to concerns for uacs (see separate a/p) and use neb prn   Each maintenance medication was reviewed in detail including emphasizing most importantly the difference between maintenance and prns and under what circumstances the prns are to be triggered using an action plan format where appropriate.  Total time for H and P, chart review, counseling, reviewing dpi/hfa/ device(s) , directly observing portions of ambulatory 02 saturation study/ and generating customized AVS unique to this office visit / same day charting = 60 min new pt eval

## 2023-05-24 NOTE — Patient Instructions (Addendum)
Pantoprazole 40 mg Take 30- 60 min before your first and last meals of the day    GERD (REFLUX)  is an extremely common cause of respiratory symptoms just like yours , many times with no obvious heartburn at all.    It can be treated with medication, but also with lifestyle changes including elevation of the head of your bed (ideally with 6 -8inch blocks under the headboard of your bed),  Smoking cessation, avoidance of late meals, excessive alcohol, and avoid fatty foods, chocolate, peppermint, colas, red wine, and acidic juices such as orange juice.  NO MINT OR MENTHOL PRODUCTS SO NO COUGH DROPS  USE SUGARLESS CANDY INSTEAD (Jolley ranchers or Stover's or Life Savers) or even ice chips will also do - the key is to swallow to prevent all throat clearing. NO OIL BASED VITAMINS - use powdered substitutes.  Avoid fish oil when coughing.   Stop advair and just use duoneb  up to every 6 hours as needed if can't catch your breath.  Omnicef 300 mg twice daily x 10 day   Prednisone 10 mg take  4 each am x 2 days,   2 each am x 2 days,  1 each am x 2 days and stop   Please remember to go to the  x-ray department  for your tests - we will call you with the results when they are available    Please remember to go to the lab department   for your tests - we will call you with the results when they are available.      My office will be contacting you by phone for referral to sinus  06/19/23  and swallowing evaluation - if you don't hear back from my office within one week please call us back or notify us thru MyChart and we'll address it right away.    Please schedule a follow up office visit in 6 weeks, call sooner if needed

## 2023-05-24 NOTE — Assessment & Plan Note (Addendum)
Onset 1972 worse since dec 2023 assoc with dysphagia  - sinus CT 06/19/23 - MBS  06/19/23   Upper airway cough syndrome (previously labeled PNDS),  is so named because it's frequently impossible to sort out how much is  CR/sinusitis with freq throat clearing (which can be related to primary GERD)   vs  causing  secondary (" extra esophageal")  GERD from wide swings in gastric pressure that occur with throat clearing, often  promoting self use of mint and menthol lozenges that reduce the lower esophageal sphincter tone and exacerbate the problem further in a cyclical fashion.   These are the same pts (now being labeled as having "irritable larynx syndrome" by some cough centers) who not infrequently have a history of having failed to tolerate ace inhibitors,  dry powder inhalers(esp advair)  or biphosphonates or report having atypical/extraesophageal reflux symptoms that don't respond to standard doses of PPI  and are easily confused as having aecopd or asthma flares by even experienced allergists/ pulmonologists (myself included).   Will d/c advair dpi, optimize rx for gerd  and pursue w/u for source and rx omnicef (not pcn anaphylactic so low risk) 300 mg bid x 10 day prior to sinus ct and returning for MBS  Discussed in detail all the  indications, usual  risks and alternatives  relative to the benefits with patient who agrees to proceed with w/u and rx  as outlined.

## 2023-05-25 ENCOUNTER — Other Ambulatory Visit (HOSPITAL_COMMUNITY): Payer: Self-pay | Admitting: *Deleted

## 2023-05-25 DIAGNOSIS — R1310 Dysphagia, unspecified: Secondary | ICD-10-CM

## 2023-05-25 LAB — IGE: IgE (Immunoglobulin E), Serum: 91 kU/L (ref <=114)

## 2023-05-29 DIAGNOSIS — M5459 Other low back pain: Secondary | ICD-10-CM | POA: Diagnosis not present

## 2023-06-13 ENCOUNTER — Ambulatory Visit (HOSPITAL_COMMUNITY)
Admission: RE | Admit: 2023-06-13 | Discharge: 2023-06-13 | Disposition: A | Discharge status: Home / Self Care | Payer: PPO | Source: Ambulatory Visit | Attending: Adult Health

## 2023-06-13 ENCOUNTER — Ambulatory Visit (HOSPITAL_COMMUNITY)
Admission: RE | Admit: 2023-06-13 | Discharge: 2023-06-13 | Disposition: A | Discharge status: Home / Self Care | Payer: PPO | Source: Ambulatory Visit | Attending: Internal Medicine | Admitting: Internal Medicine

## 2023-06-13 DIAGNOSIS — R059 Cough, unspecified: Secondary | ICD-10-CM | POA: Diagnosis present

## 2023-06-13 DIAGNOSIS — I10 Essential (primary) hypertension: Secondary | ICD-10-CM | POA: Insufficient documentation

## 2023-06-13 DIAGNOSIS — K219 Gastro-esophageal reflux disease without esophagitis: Secondary | ICD-10-CM | POA: Diagnosis not present

## 2023-06-13 DIAGNOSIS — R638 Other symptoms and signs concerning food and fluid intake: Secondary | ICD-10-CM | POA: Diagnosis not present

## 2023-06-13 DIAGNOSIS — F319 Bipolar disorder, unspecified: Secondary | ICD-10-CM | POA: Insufficient documentation

## 2023-06-13 DIAGNOSIS — E785 Hyperlipidemia, unspecified: Secondary | ICD-10-CM | POA: Diagnosis not present

## 2023-06-13 DIAGNOSIS — R1310 Dysphagia, unspecified: Secondary | ICD-10-CM | POA: Diagnosis not present

## 2023-06-13 DIAGNOSIS — R1319 Other dysphagia: Secondary | ICD-10-CM

## 2023-06-13 DIAGNOSIS — M2578 Osteophyte, vertebrae: Secondary | ICD-10-CM | POA: Diagnosis not present

## 2023-06-13 NOTE — Therapy (Addendum)
I have reviewed and concur with this student's documentation.   Angela Nevin, MA, CCC-SLP Speech Therapy  06/13/2023 1:46 PM   Modified Barium Swallow Study  Patient Details  Name: Sean Hill MRN: [id number] Date of Birth: 22-Sep-1940  Today's Date: 06/13/2023  Modified Barium Swallow completed.  Full report located under Chart Review in the Imaging Section.  History of Present Illness Sean Hill is an 82 y.o. male with PMH: GERD, esophageal dilation x2 (patient reports most recent was February 2023) depression, bipolar disorder, anxiety, arthritis, HTN, HLD, impaired hearing. He presented to this outpatient Modified barium swallow study as per recommendation/referral from pulmonologist secondary to upper airway cough syndrome associated with dysphagia. Patient with dysphagia c/o of coughing up food once a week, especially with rice.   Clinical Impression Sean Hill was seen for an outpatient modified barium swallow study as per his complaints of coughing up food, globus sensation, and coughing up phlegm after meals. He stated that he has had esophagus dilation x 2. Sean Hill presents with a mild esophogeal and/or structural dysphagia as evidenced by the results of this study. No s/sx of aspiration, penetration, or significant residuals present at the time of this evaluation. 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 PES appeared open at rest. Mild barium retrograde observed during administration of puree and solid consistencies. Swallow was delayed to the vallecula, but occasionally delayed to the pyriform sinus with honey and nectar thick. Delay did not negatively impact the functionality of swallow. Epiglottic inversion was complete and coordinated, effectively protecting the airway. Sean Hill exhibited multiple, small, consecutive swallows with honey thick, solid,  and barium tablet with thin. This can likely be explained by the narrowing at level of the PES due to presence of structural differences. No further ST is recommended at this time, as swallow function is not significantly impacted and there is limited risk of aspiration. His described symptoms can likely be attributed to the presence of cervical osteophytes, cricopharyngeal bar, and GERD. Sean Hill was advised to follow meals with liquid as needed and continue to follow acid reflux precautions.   Swallow Evaluation Recommendations Recommendations: PO diet PO Diet Recommendation: Regular;Thin liquids (Level 0) Liquid Administration via: Cup;Straw Medication Administration: Whole meds with liquid Supervision: Patient able to self-feed Swallowing strategies  : Slow rate;Small bites/sips;Minimize environmental distractions Postural changes: Position pt fully upright for meals Oral care recommendations: Oral care BID (2x/day)      Marline Backbone, B.S., Speech Therapy Student   06/13/2023,1:41 PM

## 2023-06-19 ENCOUNTER — Ambulatory Visit (HOSPITAL_COMMUNITY)
Admission: RE | Admit: 2023-06-19 | Discharge: 2023-06-19 | Disposition: A | Discharge status: Home / Self Care | Payer: PPO | Source: Ambulatory Visit | Attending: Internal Medicine | Admitting: Internal Medicine

## 2023-06-19 ENCOUNTER — Encounter (HOSPITAL_COMMUNITY): Payer: Self-pay

## 2023-06-19 DIAGNOSIS — R058 Other specified cough: Secondary | ICD-10-CM | POA: Diagnosis not present

## 2023-06-19 DIAGNOSIS — J329 Chronic sinusitis, unspecified: Secondary | ICD-10-CM | POA: Diagnosis not present

## 2023-06-22 ENCOUNTER — Other Ambulatory Visit: Payer: Self-pay

## 2023-06-22 ENCOUNTER — Ambulatory Visit (HOSPITAL_BASED_OUTPATIENT_CLINIC_OR_DEPARTMENT_OTHER): Payer: PPO

## 2023-06-22 ENCOUNTER — Encounter (HOSPITAL_COMMUNITY): Payer: Self-pay

## 2023-06-22 VITALS — BP 128/78 | HR 88 | Ht 69.0 in | Wt 269.0 lb

## 2023-06-22 DIAGNOSIS — F319 Bipolar disorder, unspecified: Secondary | ICD-10-CM | POA: Diagnosis not present

## 2023-06-22 NOTE — Progress Notes (Signed)
Patient arrives today for his due injection of Abilify Maintena 400 mg. Patient is pleasant and cooperative, we spent a little bit of time talking about the upcoming fairs. Patient denies any SI/HI or AVH. The injection was prepared as ordered and administered in the RUOQ. Patient tolerated well and without complaint. He will return in 28 days for his next due injection    NDC: 59148-072-92 LOT: [id number] EXP: DEC 2026

## 2023-06-27 DIAGNOSIS — M5451 Vertebrogenic low back pain: Secondary | ICD-10-CM | POA: Diagnosis not present

## 2023-06-27 DIAGNOSIS — M5459 Other low back pain: Secondary | ICD-10-CM | POA: Diagnosis not present

## 2023-07-05 DIAGNOSIS — H353232 Exudative age-related macular degeneration, bilateral, with inactive choroidal neovascularization: Secondary | ICD-10-CM | POA: Diagnosis not present

## 2023-07-18 DIAGNOSIS — M5416 Radiculopathy, lumbar region: Secondary | ICD-10-CM | POA: Diagnosis not present

## 2023-07-19 ENCOUNTER — Ambulatory Visit: Payer: PPO | Admitting: Internal Medicine

## 2023-07-20 ENCOUNTER — Ambulatory Visit (HOSPITAL_BASED_OUTPATIENT_CLINIC_OR_DEPARTMENT_OTHER): Payer: PPO

## 2023-07-20 ENCOUNTER — Encounter (HOSPITAL_COMMUNITY): Payer: Self-pay

## 2023-07-20 VITALS — BP 137/76 | HR 75 | Ht 70.0 in | Wt 263.0 lb

## 2023-07-20 DIAGNOSIS — F319 Bipolar disorder, unspecified: Secondary | ICD-10-CM

## 2023-07-20 NOTE — Progress Notes (Signed)
Patient arrives today for his due injection of Abilify Maintena 400 mg. Patient is pleasant and cooperative, he handed out candy to trick or treaters this past weekend and is looking forward to the holidays. Patient denies any SI/HI or AVH. Injection was prepared as ordered and given in patients LUOQ. Patient tolerated well and without complaint. He will return in 28 days for next due injection.      NDC: 16109-604-54 LOT: [id number] EXP: DEC 2026

## 2023-07-27 ENCOUNTER — Ambulatory Visit: Payer: PPO | Attending: Orthopedic Surgery | Admitting: Physical Therapy

## 2023-07-27 ENCOUNTER — Other Ambulatory Visit: Payer: Self-pay

## 2023-07-27 ENCOUNTER — Encounter: Payer: Self-pay | Admitting: Physical Therapy

## 2023-07-27 DIAGNOSIS — M5459 Other low back pain: Secondary | ICD-10-CM | POA: Insufficient documentation

## 2023-07-27 DIAGNOSIS — R2689 Other abnormalities of gait and mobility: Secondary | ICD-10-CM | POA: Insufficient documentation

## 2023-07-27 DIAGNOSIS — M6281 Muscle weakness (generalized): Secondary | ICD-10-CM | POA: Insufficient documentation

## 2023-07-27 NOTE — Therapy (Signed)
OUTPATIENT PHYSICAL THERAPY THORACOLUMBAR EVALUATION   Patient Name: Sean Hill MRN: [id number] DOB:08/04/1941, 82 y.o., male Today's Date: 07/27/2023  END OF SESSION:  PT End of Session - 07/27/23 1144     Visit Number 1    Number of Visits 9    Date for PT Re-Evaluation 09/21/23    Authorization Type HTA    PT Start Time 1146    PT Stop Time 1228    PT Time Calculation (min) 42 min    Activity Tolerance Patient tolerated treatment well    Behavior During Therapy WFL for tasks assessed/performed             Past Medical History:  Diagnosis Date   Anal fissure    Anxiety    Arthritis    right wrist; pt. states "everywhere"   Asthma    Bipolar disorder (HCC)    Cataract    Depression    Difficult airway for intubation    GERD (gastroesophageal reflux disease)    Hemorrhage of colon following colonoscopy 10/08/2019   History of kidney stones    History of MRSA infection    left knee after arthroplasty   Hyperlipidemia    Hypertension    states under control with med., has been on med. x 1 yr.   Impaired hearing    Left bundle branch block (LBBB) on electrocardiogram 10/29/2015   Macular degeneration (senile) of retina    left   Pre-diabetes    Past Surgical History:  Procedure Laterality Date   ANKLE FUSION Bilateral    ANKLE SURGERY Bilateral    ligament surgery   BIOPSY  04/29/2020   Procedure: BIOPSY;  Surgeon: Shellia Cleverly, DO;  Location: [facility];  Service: Gastroenterology;;   BIOPSY  07/14/2022   Procedure: BIOPSY;  Surgeon: Shellia Cleverly, DO;  Location: [facility];  Service: Gastroenterology;;   CARDIAC CATHETERIZATION  x 2   1983; 11/14/2003   CARPOMETACARPEL SUSPENSION PLASTY Right 09/22/2016   Procedure: right thumb carpometacarpal  ARTHROPLASTY;  Surgeon: Betha Loa, MD;  Location: [facility];  Service: Orthopedics;  Laterality: Right;  right thumb carpometacarpal  ARTHROPLASTY   CARPOMETACARPEL  SUSPENSION PLASTY Right 11/02/2017   Procedure: RIGHT THUMB SUSPENSIONPLASTY WITH TIGHTROPE, DISTAL POLE SCAPHOID  EXCISION;  Surgeon: Betha Loa, MD;  Location: [facility];  Service: Orthopedics;  Laterality: Right;   COLONOSCOPY WITH PROPOFOL N/A 10/02/2019   Procedure: COLONOSCOPY WITH PROPOFOL;  Surgeon: Shellia Cleverly, DO;  Location: [facility];  Service: Gastroenterology;  Laterality: N/A;   COLONOSCOPY WITH PROPOFOL N/A 10/09/2019   Procedure: COLONOSCOPY WITH PROPOFOL;  Surgeon: Iva Boop, MD;  Location: [facility];  Service: Endoscopy;  Laterality: N/A;   COLONOSCOPY WITH PROPOFOL N/A 04/29/2020   Procedure: COLONOSCOPY WITH PROPOFOL;  Surgeon: Shellia Cleverly, DO;  Location: [facility];  Service: Gastroenterology;  Laterality: N/A;   COLONOSCOPY WITH PROPOFOL N/A 11/11/2021   Procedure: COLONOSCOPY WITH PROPOFOL;  Surgeon: Shellia Cleverly, DO;  Location: [facility];  Service: Gastroenterology;  Laterality: N/A;   ELBOW SURGERY Left    ENDOSCOPIC MUCOSAL RESECTION N/A 10/02/2019   Procedure: ENDOSCOPIC MUCOSAL RESECTION;  Surgeon: Shellia Cleverly, DO;  Location: [facility];  Service: Gastroenterology;  Laterality: N/A;   ESOPHAGOGASTRODUODENOSCOPY (EGD) WITH PROPOFOL N/A 07/14/2022   Procedure: ESOPHAGOGASTRODUODENOSCOPY (EGD) WITH PROPOFOL;  Surgeon: Shellia Cleverly, DO;  Location: [facility];  Service: Gastroenterology;  Laterality: N/A;   HEMOSTASIS CLIP PLACEMENT  10/09/2019  Procedure: HEMOSTASIS CLIP PLACEMENT;  Surgeon: Iva Boop, MD;  Location: [facility];  Service: Endoscopy;;   HEMOSTASIS CLIP PLACEMENT  11/11/2021   Procedure: HEMOSTASIS CLIP PLACEMENT;  Surgeon: Shellia Cleverly, DO;  Location: [facility];  Service: Gastroenterology;;   HEMOSTASIS CLIP PLACEMENT  07/14/2022   Procedure: HEMOSTASIS CLIP PLACEMENT;  Surgeon: Shellia Cleverly, DO;  Location: [facility];  Service: Gastroenterology;;   INGUINAL  HERNIA REPAIR Right    KNEE SURGERY     OPEN REDUCTION INTERNAL FIXATION (ORIF) DISTAL RADIAL FRACTURE Right 10/29/2015   Procedure: OPEN REDUCTION INTERNAL FIXATION (ORIF) DISTAL RADIAL FRACTURE;  Surgeon: Betha Loa, MD;  Location: [facility];  Service: Orthopedics;  Laterality: Right;   ORIF ELBOW FRACTURE Right    POLYPECTOMY  04/29/2020   Procedure: POLYPECTOMY;  Surgeon: Shellia Cleverly, DO;  Location: [facility];  Service: Gastroenterology;;   POLYPECTOMY  11/11/2021   Procedure: POLYPECTOMY;  Surgeon: Shellia Cleverly, DO;  Location: [facility];  Service: Gastroenterology;;   POLYPECTOMY  07/14/2022   Procedure: POLYPECTOMY;  Surgeon: Shellia Cleverly, DO;  Location: [facility];  Service: Gastroenterology;;   Gaspar Bidding DILATION N/A 07/14/2022   Procedure: SAVORY DILATION;  Surgeon: Shellia Cleverly, DO;  Location: [facility];  Service: Gastroenterology;  Laterality: N/A;   SUBMUCOSAL LIFTING INJECTION  10/02/2019   Procedure: SUBMUCOSAL LIFTING INJECTION;  Surgeon: Shellia Cleverly, DO;  Location: [facility];  Service: Gastroenterology;;   TONSILLECTOMY     TOTAL KNEE ARTHROPLASTY Bilateral    TOTAL KNEE REVISION Right 08/25/2021   Procedure: Right knee polyethylene vs total knee arthroplasty revision;  Surgeon: Ollen Gross, MD;  Location: [facility];  Service: Orthopedics;  Laterality: Right;   ULNAR COLLATERAL LIGAMENT REPAIR Right 12/29/2015   Procedure: REPAIR  RIGHT LATERAL ULNAR COLLATERAL LIGAMENT TEAR  EXTENSOR ORIGIN ;  Surgeon: Betha Loa, MD;  Location: [facility];  Service: Orthopedics;  Laterality: Right;   WRIST ARTHROSCOPY WITH DEBRIDEMENT Right 07/14/2016   Procedure: RIGHT WRIST ARTHROSCOPY WITH DEBRIDEMENT  TRIANGULAR FIBROCARTILAGE COMPLEX;  Surgeon: Betha Loa, MD;  Location: [facility];  Service: Orthopedics;  Laterality: Right;   Patient Active Problem List   Diagnosis Date Noted   Upper airway  cough syndrome 05/24/2023   DOE (dyspnea on exertion) 05/24/2023   Morbid obesity due to excess calories (HCC) 05/24/2023   Esophageal dysphagia    Difficult intubation    Hiatal hernia    Gastric polyps    Adenomatous polyp of transverse colon    Grade II internal hemorrhoids    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/25/2021   Bipolar affective disorder, currently depressed, mild (HCC) 10/21/2019   Hemorrhage of colon following colonoscopy 10/08/2019   Adenomatous polyp of ascending colon    Tubulovillous adenoma of colon    History of colonic polyps    Diverticulosis of colon without hemorrhage    Grade I internal hemorrhoids    Essential hypertension 08/23/2016   Left bundle branch block (LBBB) on electrocardiogram 06/10/2016   Hyperlipidemia 06/07/2016   Left leg swelling 01/06/2015   Change in bowel habits 01/06/2015   Gastroesophageal reflux disease 11/28/2011   Bipolar disorder (HCC) 11/25/2011    PCP: Shirline Frees, NP  REFERRING PROVIDER: Venita Lick, MD  REFERRING DIAG: M54.51 (ICD-10-CM) - Vertebrogenic low back pain  Rationale for Evaluation and Treatment: Rehabilitation  THERAPY DIAG:  Other low back pain  Muscle weakness (generalized)  Other abnormalities of gait and mobility  ONSET DATE: 6 months  SUBJECTIVE:                                                                                                                                                                                           SUBJECTIVE STATEMENT: Pt states he has had gradual increase in back pain which began around December of last year after significant increase in walking while on vacation. Symptoms feeling consistent since onset. Does have past history of back pain, injury when he was about 82 y.o while lifting. Has had fluctuating symptoms since but tends to self resolve. This episode feels similar in nature but not  resolving. Infrequent N/T on RLE, not on LLE. No changes in bowel/bladder since back pain onset, does endorse frequent urination, unchanging; states he is on meds for this and provider is aware. No saddle anesthesia or buckling.  States he he has had injection without relief, did receive XR/MRI and was told to try PT, may end up trying internal TENS unit.   PERTINENT HISTORY:  HTN, hemorrhoids, bipolar disorder, GERD, dysphagia, R TKA, DOE; extensive MSK hx as above  PAIN:  Are you having pain: "aware of it but doesn't really hurt" Location/description: low back, BIL hips. Occasionally/infrequently into R knee, very infrequently into foot.  Best-worst over past week: 0-8/10  - aggravating factors: walking ~15min, reaching forward, bending, standing a few minutes - Easing factors: rest    PRECAUTIONS: fall risk, multiple MSK surgeries  WEIGHT BEARING RESTRICTIONS: No  FALLS:  Has patient fallen in last 6 months? No - remote history of falls   LIVING ENVIRONMENT: 1 level town home 4STE with rails Lives with wife who does majority of housework  OCCUPATION: retired  PLOF: Independent - does endorse chronic issues with MSK hx  PATIENT GOALS: reduce pain  NEXT MD VISIT: TBD  OBJECTIVE:  Note: Objective measures were completed at Evaluation unless otherwise noted.  DIAGNOSTIC FINDINGS:  No recent spine imaging in chart - pt states he has had lumbar MRI and XR within the past couple months which he states showed stenosis, no fractures or instability  PATIENT SURVEYS:  FOTO 36 current, 47 predicted  SCREENING FOR RED FLAGS: Red flag screening reassuring overall - bowel/bladder changes: frequent urination, denies loss of control, states providers are aware and he is on medication, describes this as stable and unchanging over past several months at least - bucking/overt weakness: pt denies - fevers/night sweats: does endorse night sweats over past few months, unchanging - encouraged  pt to reach out to provider to discuss - bilateral LE numbness: none  -  unintentional weight loss: none    COGNITION: Overall cognitive status: Within functional limits for tasks assessed     SENSATION/NEURO: - Light touch intact BIL LE aside from BIL toes, pt endorses chronic numbness issues from surgeries - No clonus either LE - Negative hoffmann and tromner sign BIL - No ataxia with gait   POSTURE: forward head, reduced lordosis  PALPATION: deferred  LUMBAR ROM:   AROM eval  Flexion To knees, painful  Extension 50%  Right lateral flexion   Left lateral flexion   Right rotation 25% s  Left rotation 25% s   (Blank rows = not tested) (Key: WFL = within functional limits not formally assessed, * = concordant pain, s = stiffness/stretching sensation, NT = not tested)   LOWER EXTREMITY ROM:     Active  Right eval Left eval  Hip flexion    Hip extension    Hip internal rotation    Hip external rotation    Knee extension    Knee flexion    (Blank rows = not tested) (Key: WFL = within functional limits not formally assessed, * = concordant pain, s = stiffness/stretching sensation, NT = not tested)  Comments:    LOWER EXTREMITY MMT:    MMT Right eval Left eval  Hip flexion 4- * 4+  Hip abduction (modified sitting) 5 5  Hip internal rotation    Hip external rotation    Knee flexion 4 4  Knee extension 4 4  Ankle dorsiflexion     (Blank rows = not tested) (Key: WFL = within functional limits not formally assessed, * = concordant pain, s = stiffness/stretching sensation, NT = not tested)  Comments:    LUMBAR SPECIAL TESTS:  Deferred  FUNCTIONAL TESTS:  30secSTS: 10 repetitions no UE support, mild knee pain  GAIT: Distance walked: within clinic Assistive device utilized: None Level of assistance: Complete Independence Comments: reduced gait speed, reduced cadence, widened BOS, reduced trunk rotation  TODAY'S TREATMENT:                                                                                                                               [facility] Adult PT Treatment:                                                DATE: 07/27/23 Deferred given time constraints   PATIENT EDUCATION:  Education details: Pt education on PT impairments, prognosis, and POC. Informed consent. Rationale for interventions Person educated: Patient Education method: Explanation, Demonstration, Tactile cues, Verbal cues Education comprehension: verbalized understanding, returned demonstration, verbal cues required, tactile cues required, and needs further education    HOME EXERCISE PROGRAM: deferred  ASSESSMENT:  CLINICAL IMPRESSION: Patient is a pleasant 82 y.o. gentleman who was seen today for physical therapy evaluation and treatment for exacerbation  of chronic low back pain, this episode ongoing since increased activity while on vacation December of last year. Describes symptoms as stable, limiting his tolerance to standing, walking, and bending forward. On exam pt demonstrates global limitations in lumbar mobility (less tolerant to flexion than extension), concordant pain with R hip flexion, and mild LE weakness that is symmetrical. 30sec STS indicative of reduced exercise/activity tolerance although pt tolerates relatively well. No adverse events, denies any increase in pain on departure. Recommend trial of skilled PT to address aforementioned deficits with aim of improving functional tolerance and reducing pain with typical activities. Pt departs today's session in no acute distress, all voiced concerns/questions addressed appropriately from PT perspective.    OBJECTIVE IMPAIRMENTS: Abnormal gait, decreased activity tolerance, decreased endurance, decreased mobility, difficulty walking, decreased ROM, decreased strength, improper body mechanics, and pain.   ACTIVITY LIMITATIONS: carrying, lifting, bending, standing, stairs, transfers, and locomotion  level  PARTICIPATION LIMITATIONS: meal prep, cleaning, laundry, and community activity  PERSONAL FACTORS: Age, Time since onset of injury/illness/exacerbation, and 3+ comorbidities: HTN, bipolar disorder, prior R TKA, DOE  are also affecting patient's functional outcome.   REHAB POTENTIAL: Fair given chronicity, comorbidities and MSK history  CLINICAL DECISION MAKING: Stable/uncomplicated  EVALUATION COMPLEXITY: Low   GOALS: Goals reviewed with patient? Yes  SHORT TERM GOALS: Target date: 08/24/2023 Pt will demonstrate appropriate understanding and performance of initially prescribed HEP in order to facilitate improved independence with management of symptoms.  Baseline: HEP provided on eval Goal status: INITIAL   2. Pt will score greater than or equal to 42 on FOTO in order to demonstrate improved perception of function due to symptoms.  Baseline: 36  Goal status: INITIAL    LONG TERM GOALS: Target date: 09/21/2023 Pt will score 47 on FOTO in order to demonstrate improved perception of functional status due to symptoms.  Baseline: 36 Goal status: INITIAL  2.  Pt will demonstrate lumbar flexion AROM to at least distal shin in order to demonstrate improved tolerance to fwd bending and ADLs. Baseline: see ROM chart above Goal status: INITIAL  3.  Pt will demonstrate hip flexion MMT of 4+/5 bilaterally in order to demonstrate improved strength for functional movements.  Baseline: see MMT chart above Goal status: INITIAL  4. Pt will be able to perform at least 14 repetitions during 30 second sit to stand test in order to demonstrate improved exercise/activity tolerance (cutoff score for low exercise tolerance 18 repetitions in males and 16 repetitions in females per Georgina Snell al 2024)  Baseline: 10 repetitions, no UE support  Goal status: INITIAL    5. Pt will report at least 50% decrease in overall pain levels in past week in order to facilitate improved tolerance to basic  ADLs/mobility.   Baseline: 0-8/10  Goal status: INITIAL    PLAN:  PT FREQUENCY: 1-2x/week  PT DURATION: 8 weeks  PLANNED INTERVENTIONS: [id number]- PT Re-evaluation, 97110-Therapeutic exercises, 97530- Therapeutic activity, 97112- Neuromuscular re-education, 97535- Self Care, 28413- Manual therapy, [phone]- Gait training, [phone]- Aquatic Therapy, Patient/Family education, Balance training, Stair training, Taping, Dry Needling, Joint mobilization, Spinal mobilization, Cryotherapy, and Moist heat.  PLAN FOR NEXT SESSION: establish HEP. Work on lumbar mobility within pt tolerance, lumbopelvic stability/activation. Symptom modification strategies as indicated/appropriate.    Ashley Murrain PT, DPT 07/27/2023 2:03 PM

## 2023-07-28 ENCOUNTER — Telehealth (HOSPITAL_BASED_OUTPATIENT_CLINIC_OR_DEPARTMENT_OTHER): Payer: PPO | Admitting: Psychiatry

## 2023-07-28 ENCOUNTER — Encounter (HOSPITAL_COMMUNITY): Payer: Self-pay | Admitting: Psychiatry

## 2023-07-28 VITALS — Wt 263.0 lb

## 2023-07-28 DIAGNOSIS — F319 Bipolar disorder, unspecified: Secondary | ICD-10-CM | POA: Diagnosis not present

## 2023-07-28 DIAGNOSIS — F411 Generalized anxiety disorder: Secondary | ICD-10-CM | POA: Diagnosis not present

## 2023-07-28 MED ORDER — TEMAZEPAM 15 MG PO CAPS
15.0000 mg | ORAL_CAPSULE | Freq: Every day | ORAL | 0 refills | Status: DC
Start: 2023-07-28 — End: 2023-10-23

## 2023-07-28 MED ORDER — GABAPENTIN 100 MG PO CAPS
ORAL_CAPSULE | ORAL | 0 refills | Status: DC
Start: 2023-07-28 — End: 2023-10-23

## 2023-07-28 MED ORDER — ARIPIPRAZOLE ER 400 MG IM PRSY
400.0000 mg | PREFILLED_SYRINGE | INTRAMUSCULAR | Status: DC
Start: 2023-07-28 — End: 2023-10-23

## 2023-07-28 NOTE — Progress Notes (Signed)
[facility] MD Virtual Progress Note   Patient Location: Home Provider Location: Home Office  I connect with patient by video and verified that I am speaking with correct person by using two identifiers. I discussed the limitations of evaluation and management by telemedicine and the availability of in person appointments. I also discussed with the patient that there may be a patient responsible charge related to this service. The patient expressed understanding and agreed to proceed.  Sean Hill [id number] 82 y.o.  07/28/2023 10:50 AM  History of Present Illness:  Patient is evaluated by video session.  He reported his mood is stable but he started to notice a decrease in attention, concentration.  Sometime he loses the thought process and get distracted.  He is not sure if anything trigger.  He is taking gabapentin for past 2 months and he noticed symptoms started around that time.  He has prescribed tramadol for pain medicine but he has not taken it.  He has chronic pain and he is going to see the pain management to get his intra-articular injection.  Denies any mania, psychosis, hallucination.  Denies any suicidal thoughts.  His appetite is fair.  Denies drinking or using illegal substances.  He is taking temazepam that is helping his sleep.  He is taking Abilify injection but is helping his mood.  He denies any recent impulsive behavior.  He lives with his wife is very supportive.  Recently had a blood work and blood sugar was normal but admitted when he checked his blood sugar is slightly increased.  Denies drinking or using any illegal substances.  Past Psychiatric History: H/O multiple hospitalization.  Last inpatient in July 2014. H/O overdose on Latuda with alcohol. H/O cutting wrist.  Took Tegretol, Depakote, Ambien, Remeron, Vistaril, Geodon, Abilify, lithium, Provigil, Zoloft, Neurontin, Lamictal, Wellbutrin, Risperdal and Pristiq. H/O mania, aggression, getting  speeding tickets, excessive buying and impulsive behavior    Outpatient Encounter Medications as of 07/28/2023  Medication Sig   albuterol (VENTOLIN HFA) 108 (90 Base) MCG/ACT inhaler Inhale 2 puffs into the lungs every 6 (six) hours as needed for wheezing or shortness of breath.   ARIPiprazole ER (ABILIFY MAINTENA) 400 MG PRSY prefilled syringe Inject 400 mg into the muscle every 28 (twenty-eight) days.   azelastine (ASTELIN) 0.1 % nasal spray Place 2 sprays into both nostrils 2 (two) times daily. Use in each nostril as directed   cefdinir (OMNICEF) 300 MG capsule Take 1 capsule (300 mg total) by mouth 2 (two) times daily.   cetirizine (ZYRTEC) 10 MG tablet Take 10 mg by mouth every morning.   citalopram (CELEXA) 20 MG tablet Take 1 tablet (20 mg total) by mouth daily.   Continuous Blood Gluc Receiver (FREESTYLE LIBRE 2 READER) DEVI Use with libre sensors   Continuous Blood Gluc Sensor (FREESTYLE LIBRE 2 SENSOR) MISC 1 Device by Does not apply route every 14 (fourteen) days.   fenofibrate (TRICOR) 145 MG tablet TAKE ONE TABLET BY MOUTH ONCE DAILY   gabapentin (NEURONTIN) 100 MG capsule Take one capsule (100 mg total) by mouth twice daily.   HYDROcodone bit-homatropine (HYCODAN) 5-1.5 MG/5ML syrup Take 5 mLs by mouth every 8 (eight) hours as needed for cough.   ipratropium-albuterol (DUONEB) 0.5-2.5 (3) MG/3ML SOLN INHALE 3 ML BY NEBULIZER EVERY 6 HOURS AS NEEDED   losartan (COZAAR) 50 MG tablet TAKE ONE TABLET BY MOUTH EVERY MORNING   metFORMIN (GLUCOPHAGE) 500 MG tablet TAKE ONE TABLET BY MOUTH ONCE DAILY  metoprolol tartrate (LOPRESSOR) 100 MG tablet Take 1 tablet by mouth once for procedure.   mirabegron ER (MYRBETRIQ) 50 MG TB24 tablet Take 1 tablet (50 mg total) by mouth daily.   Multiple Vitamin (MULTIVITAMIN WITH MINERALS) TABS tablet Take 1 tablet by mouth daily. 50 +   Multiple Vitamins-Minerals (PRESERVISION AREDS 2 PO) Take 1 capsule by mouth 2 (two) times daily.   naproxen sodium  (ALEVE) 220 MG tablet Take 220 mg by mouth daily as needed (pain).   pantoprazole (PROTONIX) 40 MG tablet Take 1 tablet (40 mg total) by mouth 2 (two) times daily. Please call [phone] to schedule an office visit for more refills.   pravastatin (PRAVACHOL) 10 MG tablet TAKE ONE TABLET BY MOUTH every other DAY   predniSONE (DELTASONE) 10 MG tablet Take  4 each am x 2 days,   2 each am x 2 days,  1 each am x 2 days and stop   temazepam (RESTORIL) 15 MG capsule Take 1 capsule (15 mg total) by mouth at bedtime.   VASCEPA 1 g capsule TAKE TWO CAPSULES BY MOUTH TWICE DAILY   Vitamin D, Cholecalciferol, 1000 UNITS TABS Take 1,000 Units by mouth daily.    Facility-Administered Encounter Medications as of 07/28/2023  Medication   ARIPiprazole ER (ABILIFY MAINTENA) 400 MG prefilled syringe 400 mg    Recent Results (from the past 2160 hour(s))  Sedimentation rate     Status: None   Collection Time: 05/24/23 11:15 AM  Result Value Ref Range   Sed Rate 5 0 - 20 mm/hr  TSH     Status: None   Collection Time: 05/24/23 11:15 AM  Result Value Ref Range   TSH 1.51 0.35 - 5.50 uIU/mL  Brain natriuretic peptide     Status: None   Collection Time: 05/24/23 11:15 AM  Result Value Ref Range   Pro B Natriuretic peptide (BNP) 41.0 0.0 - 100.0 pg/mL  Basic metabolic panel     Status: None   Collection Time: 05/24/23 11:15 AM  Result Value Ref Range   Sodium 142 135 - 145 mEq/L   Potassium 4.5 3.5 - 5.1 mEq/L   Chloride 106 96 - 112 mEq/L   CO2 26 19 - 32 mEq/L   Glucose, Bld 79 70 - 99 mg/dL   BUN 15 6 - 23 mg/dL   Creatinine, Ser 8.29 0.40 - 1.50 mg/dL   GFR 56.21 >30.86 mL/min    Comment: Calculated using the CKD-EPI Creatinine Equation (2021)   Calcium 9.5 8.4 - 10.5 mg/dL  IgE     Status: None   Collection Time: 05/24/23 11:15 AM  Result Value Ref Range   IgE (Immunoglobulin E), Serum 91 <OR=114 kU/L  CBC with Differential/Platelet     Status: Abnormal   Collection Time: 05/24/23 11:15 AM   Result Value Ref Range   WBC 3.9 (L) 4.0 - 10.5 K/uL   RBC 4.92 4.22 - 5.81 Mil/uL   Hemoglobin 14.2 13.0 - 17.0 g/dL   HCT 57.8 46.9 - 62.9 %   MCV 88.9 78.0 - 100.0 fl   MCHC 32.5 30.0 - 36.0 g/dL   RDW 52.8 41.3 - 24.4 %   Platelets 243.0 150.0 - 400.0 K/uL   Neutrophils Relative % 37.7 (L) 43.0 - 77.0 %   Lymphocytes Relative 48.3 (H) 12.0 - 46.0 %   Monocytes Relative 11.1 3.0 - 12.0 %   Eosinophils Relative 2.2 0.0 - 5.0 %   Basophils Relative 0.7 0.0 - 3.0 %  Neutro Abs 1.5 1.4 - 7.7 K/uL   Lymphs Abs 1.9 0.7 - 4.0 K/uL   Monocytes Absolute 0.4 0.1 - 1.0 K/uL   Eosinophils Absolute 0.1 0.0 - 0.7 K/uL   Basophils Absolute 0.0 0.0 - 0.1 K/uL     Psychiatric Specialty Exam: Physical Exam  Review of Systems  Psychiatric/Behavioral:  Positive for decreased concentration.     Weight 263 lb (119.3 kg).There is no height or weight on file to calculate BMI.  General Appearance: Casual  Eye Contact:  Fair  Speech:  Normal Rate  Volume:  Decreased  Mood:  Euthymic  Affect:  Appropriate  Thought Process:  Goal Directed  Orientation:  Full (Time, Place, and Person)  Thought Content:  Logical  Suicidal Thoughts:  No  Homicidal Thoughts:  No  Memory:  Immediate;   Good Recent;   Good Remote;   Fair  Judgement:  Intact  Insight:  Present  Psychomotor Activity:  Normal  Concentration:  Concentration: Fair and Attention Span: Fair  Recall:  Fair  Fund of Knowledge:  Good  Language:  Good  Akathisia:  No  Handed:  Right  AIMS (if indicated):     Assets:  Communication Skills Desire for Improvement Housing Social Support Transportation  ADL's:  Intact  Cognition:  WNL  Sleep:  fair     Assessment/Plan: Bipolar I disorder (HCC) - Plan: ARIPiprazole ER (ABILIFY MAINTENA) 400 MG PRSY prefilled syringe, temazepam (RESTORIL) 15 MG capsule, gabapentin (NEURONTIN) 100 MG capsule  GAD (generalized anxiety disorder) - Plan: gabapentin (NEURONTIN) 100 MG  capsule  Discussed issues with his attention, memory, concentration.  Possible side effects of gabapentin since no other new medication added.  He was given gabapentin 2 months ago to help his anxiety.  I recommend to discontinue and monitor if stopping the gabapentin helps his attention or concentration.  If he did not notice any improvement then he need to go back on gabapentin and call us back.  We may need to refer him to neurology.  Patient agreed with the plan.  Continue temazepam 15 mg at bedtime and Abilify injection 40 mg intramuscular every 28 days.  Encouraged to continue therapy.  He admitted missed the last appointment with Jasmine December for therapy but like to go back and restart therapy.  Follow-up in 3 months.  I reviewed blood work results.   Follow Up Instructions:     I discussed the assessment and treatment plan with the patient. The patient was provided an opportunity to ask questions and all were answered. The patient agreed with the plan and demonstrated an understanding of the instructions.   The patient was advised to call back or seek an in-person evaluation if the symptoms worsen or if the condition fails to improve as anticipated.    Collaboration of Care: Other provider involved in patient's care AEB notes are available in epic to review  Patient/Guardian was advised Release of Information must be obtained prior to any record release in order to collaborate their care with an outside provider. Patient/Guardian was advised if they have not already done so to contact the registration department to sign all necessary forms in order for [place] to release information regarding their care.   Consent: Patient/Guardian gives verbal consent for treatment and assignment of benefits for services provided during this visit. Patient/Guardian expressed understanding and agreed to proceed.     I provided 24 minutes of non face to face time during this encounter.  Note: This document was  prepared by [employer] and any errors that results from this process are unintentional.    Cleotis Nipper, MD 07/28/2023

## 2023-07-29 DIAGNOSIS — M5416 Radiculopathy, lumbar region: Secondary | ICD-10-CM | POA: Diagnosis not present

## 2023-08-01 ENCOUNTER — Other Ambulatory Visit: Payer: Self-pay | Admitting: Adult Health

## 2023-08-01 ENCOUNTER — Other Ambulatory Visit: Payer: Self-pay | Admitting: Gastroenterology

## 2023-08-01 ENCOUNTER — Other Ambulatory Visit: Payer: Self-pay | Admitting: Cardiology

## 2023-08-01 DIAGNOSIS — I10 Essential (primary) hypertension: Secondary | ICD-10-CM

## 2023-08-01 MED ORDER — METFORMIN HCL 500 MG PO TABS: 500.0000 mg | ORAL_TABLET | Freq: Every day | ORAL | 0 refills | Status: DC

## 2023-08-01 MED ORDER — PANTOPRAZOLE SODIUM 40 MG PO TBEC: 40.0000 mg | DELAYED_RELEASE_TABLET | Freq: Two times a day (BID) | ORAL | 0 refills | Status: DC

## 2023-08-01 MED ORDER — FENOFIBRATE 145 MG PO TABS: 145.0000 mg | ORAL_TABLET | Freq: Every day | ORAL | 0 refills | Status: DC

## 2023-08-01 MED ORDER — CETIRIZINE HCL 10 MG PO TABS: 10.0000 mg | ORAL_TABLET | ORAL | 0 refills | Status: DC

## 2023-08-01 MED ORDER — LOSARTAN POTASSIUM 50 MG PO TABS: 50.0000 mg | ORAL_TABLET | Freq: Every morning | ORAL | 0 refills | Status: DC

## 2023-08-09 ENCOUNTER — Other Ambulatory Visit: Payer: PPO

## 2023-08-10 NOTE — Therapy (Signed)
OUTPATIENT PHYSICAL THERAPY TREATMENT   Patient Name: Sean Hill MRN: [id number] DOB:1940/10/23, 82 y.o., male Today's Date: 08/11/2023  END OF SESSION:  PT End of Session - 08/11/23 1017     Visit Number 2    Number of Visits 9    Date for PT Re-Evaluation 09/21/23    Authorization Type HTA    PT Start Time 1018    PT Stop Time 1100    PT Time Calculation (min) 42 min    Activity Tolerance Patient tolerated treatment well    Behavior During Therapy WFL for tasks assessed/performed              Past Medical History:  Diagnosis Date   Anal fissure    Anxiety    Arthritis    right wrist; pt. states "everywhere"   Asthma    Bipolar disorder (HCC)    Cataract    Depression    Difficult airway for intubation    GERD (gastroesophageal reflux disease)    Hemorrhage of colon following colonoscopy 10/08/2019   History of kidney stones    History of MRSA infection    left knee after arthroplasty   Hyperlipidemia    Hypertension    states under control with med., has been on med. x 1 yr.   Impaired hearing    Left bundle branch block (LBBB) on electrocardiogram 10/29/2015   Macular degeneration (senile) of retina    left   Pre-diabetes    Past Surgical History:  Procedure Laterality Date   ANKLE FUSION Bilateral    ANKLE SURGERY Bilateral    ligament surgery   BIOPSY  04/29/2020   Procedure: BIOPSY;  Surgeon: Shellia Cleverly, DO;  Location: [facility];  Service: Gastroenterology;;   BIOPSY  07/14/2022   Procedure: BIOPSY;  Surgeon: Shellia Cleverly, DO;  Location: [facility];  Service: Gastroenterology;;   CARDIAC CATHETERIZATION  x 2   1983; 11/14/2003   CARPOMETACARPEL SUSPENSION PLASTY Right 09/22/2016   Procedure: right thumb carpometacarpal  ARTHROPLASTY;  Surgeon: Betha Loa, MD;  Location: [facility];  Service: Orthopedics;  Laterality: Right;  right thumb carpometacarpal  ARTHROPLASTY   CARPOMETACARPEL SUSPENSION PLASTY  Right 11/02/2017   Procedure: RIGHT THUMB SUSPENSIONPLASTY WITH TIGHTROPE, DISTAL POLE SCAPHOID  EXCISION;  Surgeon: Betha Loa, MD;  Location: [facility];  Service: Orthopedics;  Laterality: Right;   COLONOSCOPY WITH PROPOFOL N/A 10/02/2019   Procedure: COLONOSCOPY WITH PROPOFOL;  Surgeon: Shellia Cleverly, DO;  Location: [facility];  Service: Gastroenterology;  Laterality: N/A;   COLONOSCOPY WITH PROPOFOL N/A 10/09/2019   Procedure: COLONOSCOPY WITH PROPOFOL;  Surgeon: Iva Boop, MD;  Location: [facility];  Service: Endoscopy;  Laterality: N/A;   COLONOSCOPY WITH PROPOFOL N/A 04/29/2020   Procedure: COLONOSCOPY WITH PROPOFOL;  Surgeon: Shellia Cleverly, DO;  Location: [facility];  Service: Gastroenterology;  Laterality: N/A;   COLONOSCOPY WITH PROPOFOL N/A 11/11/2021   Procedure: COLONOSCOPY WITH PROPOFOL;  Surgeon: Shellia Cleverly, DO;  Location: [facility];  Service: Gastroenterology;  Laterality: N/A;   ELBOW SURGERY Left    ENDOSCOPIC MUCOSAL RESECTION N/A 10/02/2019   Procedure: ENDOSCOPIC MUCOSAL RESECTION;  Surgeon: Shellia Cleverly, DO;  Location: [facility];  Service: Gastroenterology;  Laterality: N/A;   ESOPHAGOGASTRODUODENOSCOPY (EGD) WITH PROPOFOL N/A 07/14/2022   Procedure: ESOPHAGOGASTRODUODENOSCOPY (EGD) WITH PROPOFOL;  Surgeon: Shellia Cleverly, DO;  Location: [facility];  Service: Gastroenterology;  Laterality: N/A;   HEMOSTASIS CLIP PLACEMENT  10/09/2019  Procedure: HEMOSTASIS CLIP PLACEMENT;  Surgeon: Iva Boop, MD;  Location: [facility];  Service: Endoscopy;;   HEMOSTASIS CLIP PLACEMENT  11/11/2021   Procedure: HEMOSTASIS CLIP PLACEMENT;  Surgeon: Shellia Cleverly, DO;  Location: [facility];  Service: Gastroenterology;;   HEMOSTASIS CLIP PLACEMENT  07/14/2022   Procedure: HEMOSTASIS CLIP PLACEMENT;  Surgeon: Shellia Cleverly, DO;  Location: [facility];  Service: Gastroenterology;;   INGUINAL HERNIA REPAIR Right     KNEE SURGERY     OPEN REDUCTION INTERNAL FIXATION (ORIF) DISTAL RADIAL FRACTURE Right 10/29/2015   Procedure: OPEN REDUCTION INTERNAL FIXATION (ORIF) DISTAL RADIAL FRACTURE;  Surgeon: Betha Loa, MD;  Location: [facility];  Service: Orthopedics;  Laterality: Right;   ORIF ELBOW FRACTURE Right    POLYPECTOMY  04/29/2020   Procedure: POLYPECTOMY;  Surgeon: Shellia Cleverly, DO;  Location: [facility];  Service: Gastroenterology;;   POLYPECTOMY  11/11/2021   Procedure: POLYPECTOMY;  Surgeon: Shellia Cleverly, DO;  Location: [facility];  Service: Gastroenterology;;   POLYPECTOMY  07/14/2022   Procedure: POLYPECTOMY;  Surgeon: Shellia Cleverly, DO;  Location: [facility];  Service: Gastroenterology;;   Gaspar Bidding DILATION N/A 07/14/2022   Procedure: SAVORY DILATION;  Surgeon: Shellia Cleverly, DO;  Location: [facility];  Service: Gastroenterology;  Laterality: N/A;   SUBMUCOSAL LIFTING INJECTION  10/02/2019   Procedure: SUBMUCOSAL LIFTING INJECTION;  Surgeon: Shellia Cleverly, DO;  Location: [facility];  Service: Gastroenterology;;   TONSILLECTOMY     TOTAL KNEE ARTHROPLASTY Bilateral    TOTAL KNEE REVISION Right 08/25/2021   Procedure: Right knee polyethylene vs total knee arthroplasty revision;  Surgeon: Ollen Gross, MD;  Location: [facility];  Service: Orthopedics;  Laterality: Right;   ULNAR COLLATERAL LIGAMENT REPAIR Right 12/29/2015   Procedure: REPAIR  RIGHT LATERAL ULNAR COLLATERAL LIGAMENT TEAR  EXTENSOR ORIGIN ;  Surgeon: Betha Loa, MD;  Location: [facility];  Service: Orthopedics;  Laterality: Right;   WRIST ARTHROSCOPY WITH DEBRIDEMENT Right 07/14/2016   Procedure: RIGHT WRIST ARTHROSCOPY WITH DEBRIDEMENT  TRIANGULAR FIBROCARTILAGE COMPLEX;  Surgeon: Betha Loa, MD;  Location:  [facility];  Service: Orthopedics;  Laterality: Right;   Patient Active Problem List   Diagnosis Date Noted   Upper airway cough syndrome  05/24/2023   DOE (dyspnea on exertion) 05/24/2023   Morbid obesity due to excess calories (HCC) 05/24/2023   Esophageal dysphagia    Difficult intubation    Hiatal hernia    Gastric polyps    Adenomatous polyp of transverse colon    Grade II internal hemorrhoids    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/25/2021   Bipolar affective disorder, currently depressed, mild (HCC) 10/21/2019   Hemorrhage of colon following colonoscopy 10/08/2019   Adenomatous polyp of ascending colon    Tubulovillous adenoma of colon    History of colonic polyps    Diverticulosis of colon without hemorrhage    Grade I internal hemorrhoids    Essential hypertension 08/23/2016   Left bundle branch block (LBBB) on electrocardiogram 06/10/2016   Hyperlipidemia 06/07/2016   Left leg swelling 01/06/2015   Change in bowel habits 01/06/2015   Gastroesophageal reflux disease 11/28/2011   Bipolar disorder (HCC) 11/25/2011    PCP: Shirline Frees, NP  REFERRING PROVIDER: Venita Lick, MD  REFERRING DIAG: M54.51 (ICD-10-CM) - Vertebrogenic low back pain  Rationale for Evaluation and Treatment: Rehabilitation  THERAPY DIAG:  Other low back pain  Muscle weakness (generalized)  Other abnormalities of gait and mobility  ONSET DATE: 6 months  SUBJECTIVE:                                                                                                                                                                                          Per eval - Pt states he has had gradual increase in back pain which began around December of last year after significant increase in walking while on vacation. Symptoms feeling consistent since onset. Does have past history of back pain, injury when he was about 82 y.o while lifting. Has had fluctuating symptoms since but tends to self resolve. This episode feels similar in nature but not resolving. Infrequent N/T on RLE, not  on LLE. No changes in bowel/bladder since back pain onset, does endorse frequent urination, unchanging; states he is on meds for this and provider is aware. No saddle anesthesia or buckling.  States he he has had injection without relief, did receive XR/MRI and was told to try PT, may end up trying internal TENS unit.   SUBJECTIVE STATEMENT: 08/11/2023 Pt reports 8-9/10 pain at present which he attributes to showering this morning. Reports continued fluctuations in pain. States he had an injection on the 8th which provided about a week of relief. Sees physician Sunday to discuss internal TENS vs injections   PERTINENT HISTORY:  HTN, hemorrhoids, bipolar disorder, GERD, dysphagia, R TKA, DOE; extensive MSK hx as above  PAIN:  Are you having pain: 8-9/10 low back Location/description: low back, BIL hips. Occasionally/infrequently into R knee, very infrequently into foot.  Best-worst over past week: 0-8/10  - aggravating factors: walking ~55min, reaching forward, bending, standing a few minutes - Easing factors: rest    PRECAUTIONS: fall risk, multiple MSK surgeries  WEIGHT BEARING RESTRICTIONS: No  FALLS:  Has patient fallen in last 6 months? No - remote history of falls   LIVING ENVIRONMENT: 1 level town home 4STE with rails Lives with wife who does majority of housework  OCCUPATION: retired  PLOF: Independent - does endorse chronic issues with MSK hx  PATIENT GOALS: reduce pain  NEXT MD VISIT: TBD  OBJECTIVE:  Note: Objective measures were completed at Evaluation unless otherwise noted.  DIAGNOSTIC FINDINGS:  No recent spine imaging in chart - pt states he has had lumbar MRI and XR within the past couple months which he states showed stenosis, no fractures or instability  PATIENT SURVEYS:  FOTO 36 current, 47 predicted  SCREENING FOR RED FLAGS: Red flag screening reassuring overall - bowel/bladder changes: frequent urination, denies loss of control, states providers are  aware and he is on medication, describes this  as stable and unchanging over past several months at least - bucking/overt weakness: pt denies - fevers/night sweats: does endorse night sweats over past few months, unchanging - encouraged pt to reach out to provider to discuss - bilateral LE numbness: none  - unintentional weight loss: none    COGNITION: Overall cognitive status: Within functional limits for tasks assessed     SENSATION/NEURO: - Light touch intact BIL LE aside from BIL toes, pt endorses chronic numbness issues from surgeries - No clonus either LE - Negative hoffmann and tromner sign BIL - No ataxia with gait   POSTURE: forward head, reduced lordosis  PALPATION: deferred  LUMBAR ROM:   AROM eval  Flexion To knees, painful  Extension 50%  Right lateral flexion   Left lateral flexion   Right rotation 25% s  Left rotation 25% s   (Blank rows = not tested) (Key: WFL = within functional limits not formally assessed, * = concordant pain, s = stiffness/stretching sensation, NT = not tested)   LOWER EXTREMITY ROM:     Active  Right eval Left eval  Hip flexion    Hip extension    Hip internal rotation    Hip external rotation    Knee extension    Knee flexion    (Blank rows = not tested) (Key: WFL = within functional limits not formally assessed, * = concordant pain, s = stiffness/stretching sensation, NT = not tested)  Comments:    LOWER EXTREMITY MMT:    MMT Right eval Left eval  Hip flexion 4- * 4+  Hip abduction (modified sitting) 5 5  Hip internal rotation    Hip external rotation    Knee flexion 4 4  Knee extension 4 4  Ankle dorsiflexion     (Blank rows = not tested) (Key: WFL = within functional limits not formally assessed, * = concordant pain, s = stiffness/stretching sensation, NT = not tested)  Comments:    LUMBAR SPECIAL TESTS:  Deferred  FUNCTIONAL TESTS:  30secSTS: 10 repetitions no UE support, mild knee pain  GAIT: Distance  walked: within clinic Assistive device utilized: None Level of assistance: Complete Independence Comments: reduced gait speed, reduced cadence, widened BOS, reduced trunk rotation  TODAY'S TREATMENT:                                                                                                                              [facility] Adult PT Treatment:                                                DATE: 08/11/23 Therapeutic Exercise: Seated march, unsupported 2x8 BIL cues for posture  Seated thoracolumbar extension 3x8 cues for comfortable ROM and breath control Standing mini hinge at wall, 2x8 cues for posture and trunk mechanics Seated red band hip  abduction 2x10 cues for posture, sitting unsupported Seated hip adduction iso w/ ball 2x12 cues for form, posture, and breath control  Standing hip ext/abduction x8 each at counter cues for form and posture HEP handout + education   PATIENT EDUCATION:  Education details: rationale for interventions, HEP  Person educated: Patient Education method: Explanation, Demonstration, Tactile cues, Verbal cues Education comprehension: verbalized understanding, returned demonstration, verbal cues required, tactile cues required, and needs further education     HOME EXERCISE PROGRAM: Access Code: [email or web address] URL: [email or web address] Date: 08/11/2023 Prepared by: Fransisco Hertz  Exercises - Seated Hip Adduction Isometrics with Newman Pies  - 2-3 x daily - 1 sets - 12 reps - Standing Hip Abduction with Counter Support  - 2-3 x daily - 1 sets - 8 reps - Seated Thoracic Lumbar Extension  - 2-3 x daily - 1 sets - 8 reps  ASSESSMENT:  CLINICAL IMPRESSION: 08/11/2023 Pt arrives w/ 8-9/10 pain after showering this morning, overall about the same since initial eval. Today focusing on establishment of initial lumbopelvic program and graded exposure to lumbar extension and hinging movements. Pt tolerates well overall despite increased symptoms on arrival,  no increase in pain with activity or on departure. No adverse events. Recommend continuing along current POC in order to address relevant deficits and improve functional tolerance. Pt departs today's session in no acute distress, all voiced questions/concerns addressed appropriately from PT perspective.    Per eval - Patient is a pleasant 82 y.o. gentleman who was seen today for physical therapy evaluation and treatment for exacerbation of chronic low back pain, this episode ongoing since increased activity while on vacation December of last year. Describes symptoms as stable, limiting his tolerance to standing, walking, and bending forward. On exam pt demonstrates global limitations in lumbar mobility (less tolerant to flexion than extension), concordant pain with R hip flexion, and mild LE weakness that is symmetrical. 30sec STS indicative of reduced exercise/activity tolerance although pt tolerates relatively well. No adverse events, denies any increase in pain on departure. Recommend trial of skilled PT to address aforementioned deficits with aim of improving functional tolerance and reducing pain with typical activities. Pt departs today's session in no acute distress, all voiced concerns/questions addressed appropriately from PT perspective.    OBJECTIVE IMPAIRMENTS: Abnormal gait, decreased activity tolerance, decreased endurance, decreased mobility, difficulty walking, decreased ROM, decreased strength, improper body mechanics, and pain.   ACTIVITY LIMITATIONS: carrying, lifting, bending, standing, stairs, transfers, and locomotion level  PARTICIPATION LIMITATIONS: meal prep, cleaning, laundry, and community activity  PERSONAL FACTORS: Age, Time since onset of injury/illness/exacerbation, and 3+ comorbidities: HTN, bipolar disorder, prior R TKA, DOE  are also affecting patient's functional outcome.   REHAB POTENTIAL: Fair given chronicity, comorbidities and MSK history  CLINICAL DECISION  MAKING: Stable/uncomplicated  EVALUATION COMPLEXITY: Low   GOALS: Goals reviewed with patient? Yes  SHORT TERM GOALS: Target date: 08/24/2023 Pt will demonstrate appropriate understanding and performance of initially prescribed HEP in order to facilitate improved independence with management of symptoms.  Baseline: HEP provided on eval Goal status: INITIAL   2. Pt will score greater than or equal to 42 on FOTO in order to demonstrate improved perception of function due to symptoms.  Baseline: 36  Goal status: INITIAL    LONG TERM GOALS: Target date: 09/21/2023 Pt will score 47 on FOTO in order to demonstrate improved perception of functional status due to symptoms.  Baseline: 36 Goal status: INITIAL  2.  Pt will demonstrate lumbar flexion AROM to at  least distal shin in order to demonstrate improved tolerance to fwd bending and ADLs. Baseline: see ROM chart above Goal status: INITIAL  3.  Pt will demonstrate hip flexion MMT of 4+/5 bilaterally in order to demonstrate improved strength for functional movements.  Baseline: see MMT chart above Goal status: INITIAL  4. Pt will be able to perform at least 14 repetitions during 30 second sit to stand test in order to demonstrate improved exercise/activity tolerance (cutoff score for low exercise tolerance 18 repetitions in males and 16 repetitions in females per Georgina Snell al 2024)  Baseline: 10 repetitions, no UE support  Goal status: INITIAL    5. Pt will report at least 50% decrease in overall pain levels in past week in order to facilitate improved tolerance to basic ADLs/mobility.   Baseline: 0-8/10  Goal status: INITIAL    PLAN:  PT FREQUENCY: 1-2x/week  PT DURATION: 8 weeks  PLANNED INTERVENTIONS: 97164- PT Re-evaluation, 97110-Therapeutic exercises, 97530- Therapeutic activity, 97112- Neuromuscular re-education, 97535- Self Care, 16109- Manual therapy, [phone]- Gait training, [phone]- Aquatic Therapy, Patient/Family  education, Balance training, Stair training, Taping, Dry Needling, Joint mobilization, Spinal mobilization, Cryotherapy, and Moist heat.  PLAN FOR NEXT SESSION: review/update HEP PRN. Check in re: injection vs internal tens unit. Work on lumbar mobility within pt tolerance, lumbopelvic stability/activation. Symptom modification strategies as indicated/appropriate.    Ashley Murrain PT, DPT 08/11/2023 11:08 AM

## 2023-08-11 ENCOUNTER — Encounter: Payer: Self-pay | Admitting: Physical Therapy

## 2023-08-11 ENCOUNTER — Ambulatory Visit: Payer: PPO | Admitting: Physical Therapy

## 2023-08-11 DIAGNOSIS — M5459 Other low back pain: Secondary | ICD-10-CM | POA: Diagnosis not present

## 2023-08-11 DIAGNOSIS — R2689 Other abnormalities of gait and mobility: Secondary | ICD-10-CM

## 2023-08-11 DIAGNOSIS — M6281 Muscle weakness (generalized): Secondary | ICD-10-CM

## 2023-08-13 DIAGNOSIS — M5416 Radiculopathy, lumbar region: Secondary | ICD-10-CM | POA: Diagnosis not present

## 2023-08-14 NOTE — Therapy (Signed)
OUTPATIENT PHYSICAL THERAPY TREATMENT   Patient Name: Sean Hill MRN: [id number] DOB:06-28-1941, 81 y.o., male Today's Date: 08/15/2023  END OF SESSION:  PT End of Session - 08/15/23 1147     Visit Number 3    Number of Visits 9    Date for PT Re-Evaluation 09/21/23    Authorization Type HTA    PT Start Time 1145    PT Stop Time 1230    PT Time Calculation (min) 45 min    Activity Tolerance Patient tolerated treatment well    Behavior During Therapy WFL for tasks assessed/performed               Past Medical History:  Diagnosis Date   Anal fissure    Anxiety    Arthritis    right wrist; pt. states "everywhere"   Asthma    Bipolar disorder (HCC)    Cataract    Depression    Difficult airway for intubation    GERD (gastroesophageal reflux disease)    Hemorrhage of colon following colonoscopy 10/08/2019   History of kidney stones    History of MRSA infection    left knee after arthroplasty   Hyperlipidemia    Hypertension    states under control with med., has been on med. x 1 yr.   Impaired hearing    Left bundle branch block (LBBB) on electrocardiogram 10/29/2015   Macular degeneration (senile) of retina    left   Pre-diabetes    Past Surgical History:  Procedure Laterality Date   ANKLE FUSION Bilateral    ANKLE SURGERY Bilateral    ligament surgery   BIOPSY  04/29/2020   Procedure: BIOPSY;  Surgeon: Shellia Cleverly, DO;  Location: [facility];  Service: Gastroenterology;;   BIOPSY  07/14/2022   Procedure: BIOPSY;  Surgeon: Shellia Cleverly, DO;  Location: [facility];  Service: Gastroenterology;;   CARDIAC CATHETERIZATION  x 2   1983; 11/14/2003   CARPOMETACARPEL SUSPENSION PLASTY Right 09/22/2016   Procedure: right thumb carpometacarpal  ARTHROPLASTY;  Surgeon: Betha Loa, MD;  Location: [facility];  Service: Orthopedics;  Laterality: Right;  right thumb carpometacarpal  ARTHROPLASTY   CARPOMETACARPEL SUSPENSION PLASTY  Right 11/02/2017   Procedure: RIGHT THUMB SUSPENSIONPLASTY WITH TIGHTROPE, DISTAL POLE SCAPHOID  EXCISION;  Surgeon: Betha Loa, MD;  Location: [facility];  Service: Orthopedics;  Laterality: Right;   COLONOSCOPY WITH PROPOFOL N/A 10/02/2019   Procedure: COLONOSCOPY WITH PROPOFOL;  Surgeon: Shellia Cleverly, DO;  Location: [facility];  Service: Gastroenterology;  Laterality: N/A;   COLONOSCOPY WITH PROPOFOL N/A 10/09/2019   Procedure: COLONOSCOPY WITH PROPOFOL;  Surgeon: Iva Boop, MD;  Location: [facility];  Service: Endoscopy;  Laterality: N/A;   COLONOSCOPY WITH PROPOFOL N/A 04/29/2020   Procedure: COLONOSCOPY WITH PROPOFOL;  Surgeon: Shellia Cleverly, DO;  Location: [facility];  Service: Gastroenterology;  Laterality: N/A;   COLONOSCOPY WITH PROPOFOL N/A 11/11/2021   Procedure: COLONOSCOPY WITH PROPOFOL;  Surgeon: Shellia Cleverly, DO;  Location: [facility];  Service: Gastroenterology;  Laterality: N/A;   ELBOW SURGERY Left    ENDOSCOPIC MUCOSAL RESECTION N/A 10/02/2019   Procedure: ENDOSCOPIC MUCOSAL RESECTION;  Surgeon: Shellia Cleverly, DO;  Location: [facility];  Service: Gastroenterology;  Laterality: N/A;   ESOPHAGOGASTRODUODENOSCOPY (EGD) WITH PROPOFOL N/A 07/14/2022   Procedure: ESOPHAGOGASTRODUODENOSCOPY (EGD) WITH PROPOFOL;  Surgeon: Shellia Cleverly, DO;  Location: [facility];  Service: Gastroenterology;  Laterality: N/A;   HEMOSTASIS CLIP PLACEMENT  10/09/2019  Procedure: HEMOSTASIS CLIP PLACEMENT;  Surgeon: Iva Boop, MD;  Location: [facility];  Service: Endoscopy;;   HEMOSTASIS CLIP PLACEMENT  11/11/2021   Procedure: HEMOSTASIS CLIP PLACEMENT;  Surgeon: Shellia Cleverly, DO;  Location: [facility];  Service: Gastroenterology;;   HEMOSTASIS CLIP PLACEMENT  07/14/2022   Procedure: HEMOSTASIS CLIP PLACEMENT;  Surgeon: Shellia Cleverly, DO;  Location: [facility];  Service: Gastroenterology;;   INGUINAL HERNIA REPAIR Right     KNEE SURGERY     OPEN REDUCTION INTERNAL FIXATION (ORIF) DISTAL RADIAL FRACTURE Right 10/29/2015   Procedure: OPEN REDUCTION INTERNAL FIXATION (ORIF) DISTAL RADIAL FRACTURE;  Surgeon: Betha Loa, MD;  Location: [facility];  Service: Orthopedics;  Laterality: Right;   ORIF ELBOW FRACTURE Right    POLYPECTOMY  04/29/2020   Procedure: POLYPECTOMY;  Surgeon: Shellia Cleverly, DO;  Location: [facility];  Service: Gastroenterology;;   POLYPECTOMY  11/11/2021   Procedure: POLYPECTOMY;  Surgeon: Shellia Cleverly, DO;  Location: [facility];  Service: Gastroenterology;;   POLYPECTOMY  07/14/2022   Procedure: POLYPECTOMY;  Surgeon: Shellia Cleverly, DO;  Location: [facility];  Service: Gastroenterology;;   Gaspar Bidding DILATION N/A 07/14/2022   Procedure: SAVORY DILATION;  Surgeon: Shellia Cleverly, DO;  Location: [facility];  Service: Gastroenterology;  Laterality: N/A;   SUBMUCOSAL LIFTING INJECTION  10/02/2019   Procedure: SUBMUCOSAL LIFTING INJECTION;  Surgeon: Shellia Cleverly, DO;  Location: [facility];  Service: Gastroenterology;;   TONSILLECTOMY     TOTAL KNEE ARTHROPLASTY Bilateral    TOTAL KNEE REVISION Right 08/25/2021   Procedure: Right knee polyethylene vs total knee arthroplasty revision;  Surgeon: Ollen Gross, MD;  Location: [facility];  Service: Orthopedics;  Laterality: Right;   ULNAR COLLATERAL LIGAMENT REPAIR Right 12/29/2015   Procedure: REPAIR  RIGHT LATERAL ULNAR COLLATERAL LIGAMENT TEAR  EXTENSOR ORIGIN ;  Surgeon: Betha Loa, MD;  Location: [facility];  Service: Orthopedics;  Laterality: Right;   WRIST ARTHROSCOPY WITH DEBRIDEMENT Right 07/14/2016   Procedure: RIGHT WRIST ARTHROSCOPY WITH DEBRIDEMENT  TRIANGULAR FIBROCARTILAGE COMPLEX;  Surgeon: Betha Loa, MD;  Location: [facility];  Service: Orthopedics;  Laterality: Right;   Patient Active Problem List   Diagnosis Date Noted   Upper airway cough syndrome  05/24/2023   DOE (dyspnea on exertion) 05/24/2023   Morbid obesity due to excess calories (HCC) 05/24/2023   Esophageal dysphagia    Difficult intubation    Hiatal hernia    Gastric polyps    Adenomatous polyp of transverse colon    Grade II internal hemorrhoids    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/25/2021   Bipolar affective disorder, currently depressed, mild (HCC) 10/21/2019   Hemorrhage of colon following colonoscopy 10/08/2019   Adenomatous polyp of ascending colon    Tubulovillous adenoma of colon    History of colonic polyps    Diverticulosis of colon without hemorrhage    Grade I internal hemorrhoids    Essential hypertension 08/23/2016   Left bundle branch block (LBBB) on electrocardiogram 06/10/2016   Hyperlipidemia 06/07/2016   Left leg swelling 01/06/2015   Change in bowel habits 01/06/2015   Gastroesophageal reflux disease 11/28/2011   Bipolar disorder (HCC) 11/25/2011    PCP: Shirline Frees, NP  REFERRING PROVIDER: Venita Lick, MD  REFERRING DIAG: M54.51 (ICD-10-CM) - Vertebrogenic low back pain  Rationale for Evaluation and Treatment: Rehabilitation  THERAPY DIAG:  Other low back pain  Muscle weakness (generalized)  Other abnormalities of gait and mobility  ONSET DATE: 6 months  SUBJECTIVE:                                                                                                                                                                                          Per eval - Pt states he has had gradual increase in back pain which began around December of last year after significant increase in walking while on vacation. Symptoms feeling consistent since onset. Does have past history of back pain, injury when he was about 82 y.o while lifting. Has had fluctuating symptoms since but tends to self resolve. This episode feels similar in nature but not resolving. Infrequent N/T on RLE, not  on LLE. No changes in bowel/bladder since back pain onset, does endorse frequent urination, unchanging; states he is on meds for this and provider is aware. No saddle anesthesia or buckling.  States he he has had injection without relief, did receive XR/MRI and was told to try PT, may end up trying internal TENS unit.   SUBJECTIVE STATEMENT: 08/15/2023 Pt reports 6-7/10 pain at present and seems to get worse when I move. States he had an injection on the 8th  and may have a third. Injection and still may get a second opinion for a neurosurgeon   PERTINENT HISTORY:  HTN, hemorrhoids, bipolar disorder, GERD, dysphagia, R TKA, DOE; extensive MSK hx as above  PAIN:  Are you having pain: 8-9/10 low back Location/description: low back, BIL hips. Occasionally/infrequently into R knee, very infrequently into foot.  Best-worst over past week: 0-8/10  - aggravating factors: walking ~44min, reaching forward, bending, standing a few minutes - Easing factors: rest    PRECAUTIONS: fall risk, multiple MSK surgeries  WEIGHT BEARING RESTRICTIONS: No  FALLS:  Has patient fallen in last 6 months? No - remote history of falls   LIVING ENVIRONMENT: 1 level town home 4STE with rails Lives with wife who does majority of housework  OCCUPATION: retired  PLOF: Independent - does endorse chronic issues with MSK hx  PATIENT GOALS: reduce pain  NEXT MD VISIT: TBD  OBJECTIVE:  Note: Objective measures were completed at Evaluation unless otherwise noted.  DIAGNOSTIC FINDINGS:  No recent spine imaging in chart - pt states he has had lumbar MRI and XR within the past couple months which he states showed stenosis, no fractures or instability  PATIENT SURVEYS:  FOTO 36 current, 47 predicted  SCREENING FOR RED FLAGS: Red flag screening reassuring overall - bowel/bladder changes: frequent urination, denies loss of control, states providers are aware and he is on medication, describes this as stable and  unchanging over past several months at least - bucking/overt weakness: pt denies - fevers/night sweats: does endorse night sweats over past few months, unchanging - encouraged pt to reach out to provider to discuss - bilateral LE numbness: none  - unintentional weight loss: none    COGNITION: Overall cognitive status: Within functional limits for tasks assessed     SENSATION/NEURO: - Light touch intact BIL LE aside from BIL toes, pt endorses chronic numbness issues from surgeries - No clonus either LE - Negative hoffmann and tromner sign BIL - No ataxia with gait   POSTURE: forward head, reduced lordosis  PALPATION: deferred  LUMBAR ROM:   AROM eval  Flexion To knees, painful  Extension 50%  Right lateral flexion   Left lateral flexion   Right rotation 25% s  Left rotation 25% s   (Blank rows = not tested) (Key: WFL = within functional limits not formally assessed, * = concordant pain, s = stiffness/stretching sensation, NT = not tested)   LOWER EXTREMITY ROM:     Active  Right eval Left eval  Hip flexion    Hip extension    Hip internal rotation    Hip external rotation    Knee extension    Knee flexion    (Blank rows = not tested) (Key: WFL = within functional limits not formally assessed, * = concordant pain, s = stiffness/stretching sensation, NT = not tested)  Comments:    LOWER EXTREMITY MMT:    MMT Right eval Left eval  Hip flexion 4- * 4+  Hip abduction (modified sitting) 5 5  Hip internal rotation    Hip external rotation    Knee flexion 4 4  Knee extension 4 4  Ankle dorsiflexion     (Blank rows = not tested) (Key: WFL = within functional limits not formally assessed, * = concordant pain, s = stiffness/stretching sensation, NT = not tested)  Comments:    LUMBAR SPECIAL TESTS:  Deferred  FUNCTIONAL TESTS:  30secSTS: 10 repetitions no UE support, mild knee pain  GAIT: Distance walked: within clinic Assistive device utilized: None Level  of assistance: Complete Independence Comments: reduced gait speed, reduced cadence, widened BOS, reduced trunk rotation  TODAY'S TREATMENT:   [facility] Adult PT Treatment:                                                DATE: 08-15-23 Therapeutic Exercise: Supine pelvic tilt attempted to perform but pt trying to valsalva, so DC Bridging 1/2 range 2 x 8 SKTC  5 x 10 sec each bil LTR  5 x 20 sec bil Standing hip ext/abduction  3 x 5  holding onto wall  Manual Therapy: Myofasical release of bil Quadratus lumborum STW of bil lumbar and gluteals LAD bil Lumbar rotation stretch  Trigger Point Dry-Needling performed     by Garen Lah Treatment instructions: Expect mild to moderate muscle soreness. S/S of pneumothorax if dry needled over a lung field, and to seek immediate medical attention should they occur. Patient verbalized understanding of these instructions and education.  Patient Consent Given: Yes Education handout provided: Previously provided Muscles treated:  Bil Quadratus Lumborum  bil gluteals  lumbar L-2 to L-5 bil Electrical stimulation performed: No Parameters: N/A Treatment response/outcome: twitch response noted, pt noted relief  Modalities: Moist hot pack  [facility] Adult PT Treatment:                                                DATE: 08/11/23 Therapeutic Exercise: Seated march, unsupported 2x8 BIL cues for posture  Seated thoracolumbar extension 3x8 cues for comfortable ROM and breath control Standing mini hinge at wall, 2x8 cues for posture and trunk mechanics Seated red band hip abduction 2x10 cues for posture, sitting unsupported Seated hip adduction iso w/ ball 2x12 cues for form, posture, and breath control  Standing hip ext/abduction x8 each at counter cues for form and posture HEP handout + education   PATIENT EDUCATION:  Education  details: rationale for interventions, HEP  Person educated: Patient Education method: Explanation, Demonstration, Tactile cues, Verbal cues Education comprehension: verbalized understanding, returned demonstration, verbal cues required, tactile cues required, and needs further education     HOME EXERCISE PROGRAM: Access Code: [email or web address] URL: [email or web address] Date: 08/11/2023 Prepared by: Fransisco Hertz  Exercises - Seated Hip Adduction Isometrics with Newman Pies  - 2-3 x daily - 1 sets - 12 reps - Standing Hip Abduction with Counter Support  - 2-3 x daily - 1 sets - 8 reps - Seated Thoracic Lumbar Extension  - 2-3 x daily - 1 sets - 8 reps Updated 08-15-23 - Supine Single Knee to Chest Stretch  - 2 x daily - 7 x weekly - 1 sets - 5 reps - 10 hold - Supine Lower Trunk Rotation  - 2 x daily - 7 x weekly - 1 sets - 5 reps - 20 hold - Supine Bridge  - 1 x daily - 7 x weekly - 2 sets - 10 reps - Seated Hamstring Stretch  - 1-2 x daily - 7 x weekly - 1 sets - 3 reps - 20-30 sec hold ASSESSMENT:  CLINICAL IMPRESSION: 08/15/2023 Pt arrives w/ 6-7/10 pain and would like to try TPDN today. Pt educated and consented to [facility] and was closely monitored throughout session. Pt with twitch response and decrease in pain as per pt report but no specific NPS rating.  Pt then was able to perform low back exercises in supine which had been difficult to do.  Pt departs today's session in no acute distress, all voiced questions/concerns addressed appropriately from PT perspective.  Pt was a joy for whom to serve.   Per eval - Patient is a pleasant 82 y.o. gentleman who was seen today for physical therapy evaluation and treatment for exacerbation of chronic low back pain, this episode ongoing since increased activity while on vacation December of last year. Describes symptoms as stable, limiting his tolerance to standing, walking, and bending forward. On exam pt demonstrates global limitations in lumbar  mobility (less tolerant to flexion than extension), concordant pain with R hip flexion, and mild LE weakness that is symmetrical. 30sec STS indicative of reduced exercise/activity tolerance although pt tolerates relatively well. No adverse events, denies any increase in pain on departure. Recommend trial of skilled PT to address aforementioned deficits with aim of improving functional tolerance and reducing pain with typical activities. Pt departs today's session in no acute distress, all voiced concerns/questions addressed appropriately from PT perspective.    OBJECTIVE IMPAIRMENTS: Abnormal gait, decreased activity tolerance, decreased endurance, decreased mobility, difficulty walking, decreased ROM, decreased strength, improper body mechanics, and pain.   ACTIVITY LIMITATIONS: carrying, lifting, bending, standing, stairs, transfers,  and locomotion level  PARTICIPATION LIMITATIONS: meal prep, cleaning, laundry, and community activity  PERSONAL FACTORS: Age, Time since onset of injury/illness/exacerbation, and 3+ comorbidities: HTN, bipolar disorder, prior R TKA, DOE  are also affecting patient's functional outcome.   REHAB POTENTIAL: Fair given chronicity, comorbidities and MSK history  CLINICAL DECISION MAKING: Stable/uncomplicated  EVALUATION COMPLEXITY: Low   GOALS: Goals reviewed with patient? Yes  SHORT TERM GOALS: Target date: 08/24/2023 Pt will demonstrate appropriate understanding and performance of initially prescribed HEP in order to facilitate improved independence with management of symptoms.  Baseline: HEP provided on eval Goal status: INITIAL   2. Pt will score greater than or equal to 42 on FOTO in order to demonstrate improved perception of function due to symptoms.  Baseline: 36  Goal status: INITIAL    LONG TERM GOALS: Target date: 09/21/2023 Pt will score 47 on FOTO in order to demonstrate improved perception of functional status due to symptoms.  Baseline: 36 Goal  status: INITIAL  2.  Pt will demonstrate lumbar flexion AROM to at least distal shin in order to demonstrate improved tolerance to fwd bending and ADLs. Baseline: see ROM chart above Goal status: INITIAL  3.  Pt will demonstrate hip flexion MMT of 4+/5 bilaterally in order to demonstrate improved strength for functional movements.  Baseline: see MMT chart above Goal status: INITIAL  4. Pt will be able to perform at least 14 repetitions during 30 second sit to stand test in order to demonstrate improved exercise/activity tolerance (cutoff score for low exercise tolerance 18 repetitions in males and 16 repetitions in females per Georgina Snell al 2024)  Baseline: 10 repetitions, no UE support  Goal status: INITIAL    5. Pt will report at least 50% decrease in overall pain levels in past week in order to facilitate improved tolerance to basic ADLs/mobility.   Baseline: 0-8/10  Goal status: INITIAL    PLAN:  PT FREQUENCY: 1-2x/week  PT DURATION: 8 weeks  PLANNED INTERVENTIONS: 97164- PT Re-evaluation, 97110-Therapeutic exercises, 97530- Therapeutic activity, 97112- Neuromuscular re-education, 97535- Self Care, 16109- Manual therapy, [phone]- Gait training, [phone]- Aquatic Therapy, Patient/Family education, Balance training, Stair training, Taping, Dry Needling, Joint mobilization, Spinal mobilization, Cryotherapy, and Moist heat.  PLAN FOR NEXT SESSION: review/update HEP PRN. Check in re: injection vs internal tens unit. Work on lumbar mobility within pt tolerance, lumbopelvic stability/activation. Symptom modification strategies as indicated/appropriate.   Garen Lah, PT, ATRIC Certified Exercise Expert for the Aging Adult  08/15/23 12:39 PM Phone: [phone] Fax: [phone]

## 2023-08-15 ENCOUNTER — Encounter: Payer: Self-pay | Admitting: Physical Therapy

## 2023-08-15 ENCOUNTER — Ambulatory Visit: Payer: PPO | Admitting: Physical Therapy

## 2023-08-15 DIAGNOSIS — M6281 Muscle weakness (generalized): Secondary | ICD-10-CM

## 2023-08-15 DIAGNOSIS — R2689 Other abnormalities of gait and mobility: Secondary | ICD-10-CM

## 2023-08-15 DIAGNOSIS — M5459 Other low back pain: Secondary | ICD-10-CM | POA: Diagnosis not present

## 2023-08-15 NOTE — Patient Instructions (Signed)

## 2023-08-22 ENCOUNTER — Encounter: Payer: Self-pay | Admitting: Adult Health

## 2023-08-22 DIAGNOSIS — M5416 Radiculopathy, lumbar region: Secondary | ICD-10-CM | POA: Diagnosis not present

## 2023-08-22 NOTE — Telephone Encounter (Signed)
Please advise 

## 2023-08-23 ENCOUNTER — Other Ambulatory Visit: Payer: PPO

## 2023-08-24 ENCOUNTER — Other Ambulatory Visit: Payer: Self-pay

## 2023-08-24 ENCOUNTER — Encounter (HOSPITAL_COMMUNITY): Payer: Self-pay

## 2023-08-24 ENCOUNTER — Ambulatory Visit (HOSPITAL_BASED_OUTPATIENT_CLINIC_OR_DEPARTMENT_OTHER): Payer: PPO

## 2023-08-24 VITALS — BP 124/72 | HR 74 | Ht 69.0 in | Wt 265.0 lb

## 2023-08-24 DIAGNOSIS — F319 Bipolar disorder, unspecified: Secondary | ICD-10-CM | POA: Diagnosis not present

## 2023-08-24 NOTE — Progress Notes (Signed)
Patient arrives today for his due injection of Abilify Maintena 400 mg. Patient was pleasant and cooperative upon approach. Patient said that he had a good thanksgiving and his family will be doing a chinese buffet for Christmas as they do not want to do more cooking! Injection was prepared as ordered and administered in patients RUOQ. Patient tolerated well and without complaint and will return in 28 days.     NDC: 16109-604-54 LOT: [id number] EXP: DEC 2026

## 2023-08-25 ENCOUNTER — Other Ambulatory Visit: Payer: Self-pay | Admitting: Gastroenterology

## 2023-08-29 ENCOUNTER — Other Ambulatory Visit: Payer: Self-pay | Admitting: Gastroenterology

## 2023-09-06 ENCOUNTER — Other Ambulatory Visit: Payer: PPO

## 2023-09-06 NOTE — Progress Notes (Signed)
09/06/2023 Name: Sean Hill MRN: [id number] DOB: September 20, 1940  Chief Complaint  Patient presents with   Hyperlipidemia   Medication Management    GRADON GERATY is a 82 y.o. year old male who presented for a telephone visit.   They were referred to the pharmacist by their PCP for assistance in managing hypertension and complex medication management.    Subjective:  Care Team: Primary Care Provider: Shirline Frees, NP   Medication Access/Adherence  Current Pharmacy:  [id number] - [place], [place] - [address] [address] Phone: [phone] Fax: [phone]   Patient reports affordability concerns with their medications: Yes  - vascepa, pcp aware and both agree okay to start 09/20/23, should not be an issue with elimination of coverage gap/donut hole for part d for 2025 Patient reports access/transportation concerns to their pharmacy: No  Patient reports adherence concerns with their medications:  No     Hyperlipidemia/ASCVD Risk Reduction  Current lipid/TG lowering medications: Vascepa 1gm (starting 09/20/23), Fenofibrate 145mg  daily Medications tried in the past: repatha, pravastatin, simvastatin  All past meds resulted in myalgias      Objective:  Lab Results  Component Value Date   HGBA1C 5.9 08/16/2022    Lab Results  Component Value Date   CREATININE 1.10 05/24/2023   BUN 15 05/24/2023   NA 142 05/24/2023   K 4.5 05/24/2023   CL 106 05/24/2023   CO2 26 05/24/2023    Lab Results  Component Value Date   CHOL 173 05/05/2022   HDL 39.20 05/05/2022   LDLDIRECT 106.0 05/05/2022   TRIG 218.0 (H) 05/05/2022   CHOLHDL 4 05/05/2022    Medications Reviewed Today     Reviewed by Sherrill Raring, RPH (Pharmacist) on 09/06/23 at 1021  Med List Status: <None>   Medication Order Taking? Sig Documenting Provider Last Dose Status Informant  albuterol (VENTOLIN HFA) 108 (90 Base) MCG/ACT inhaler  [id number]  Inhale 2 puffs into the lungs every 6 (six) hours as needed for wheezing or shortness of breath. Nafziger, Kandee Keen, NP  Active   ARIPiprazole ER (ABILIFY MAINTENA) 400 MG prefilled syringe 400 mg [id number]    Patient taking differently: Inject 400 mg into the muscle every 28 (twenty-eight) days. Reconstitute with the attached diluent before use.   Cleotis Nipper, MD  Active   ARIPiprazole ER (ABILIFY MAINTENA) 400 MG PRSY prefilled syringe [id number] Yes Inject 400 mg into the muscle every 28 (twenty-eight) days. Cleotis Nipper, MD Taking Active   azelastine (ASTELIN) 0.1 % nasal spray [id number] Yes Place 2 sprays into both nostrils 2 (two) times daily. Use in each nostril as directed Shirline Frees, NP Taking Active   cetirizine (ZYRTEC) 10 MG tablet [id number] Yes Take 1 tablet (10 mg total) by mouth every morning. Nafziger, Kandee Keen, NP Taking Active   citalopram (CELEXA) 20 MG tablet [id number]  Take 1 tablet (20 mg total) by mouth daily. Cleotis Nipper, MD  Expired 07/26/23 2359   Continuous Blood Gluc Receiver (FREESTYLE LIBRE 2 READER) DEVI [id number] No Use with libre sensors  Patient not taking: Reported on 09/06/2023   Shirline Frees, NP Not Taking Active   Continuous Blood Gluc Sensor (FREESTYLE LIBRE 2 SENSOR) MISC [id number] No 1 Device by Does not apply route every 14 (fourteen) days.  Patient not taking: Reported on 09/06/2023   Shirline Frees, NP Not Taking Active   fenofibrate (TRICOR) 145 MG tablet [id number] Yes Take 1 tablet (145 mg total)  by mouth daily. Nafziger, Kandee Keen, NP Taking Active   gabapentin (NEURONTIN) 100 MG capsule [id number] No Take one capsule (100 mg total) by mouth twice daily.  Patient not taking: Reported on 09/06/2023   Cleotis Nipper, MD Not Taking Active   HYDROcodone bit-homatropine [facility]) 5-1.5 MG/5ML syrup [id number] No Take 5 mLs by mouth every 8 (eight) hours as needed for cough.  Patient not taking: Reported on 09/06/2023   Shirline Frees, NP Not  Taking Active   ipratropium-albuterol (DUONEB) 0.5-2.5 (3) MG/3ML SOLN [id number]  INHALE 3 ML BY NEBULIZER EVERY 6 HOURS AS NEEDED Nafziger, Kandee Keen, NP  Active   losartan (COZAAR) 50 MG tablet [id number] Yes Take 1 tablet (50 mg total) by mouth every morning. Nafziger, Kandee Keen, NP Taking Active   metFORMIN (GLUCOPHAGE) 500 MG tablet [id number] Yes Take 1 tablet (500 mg total) by mouth daily. Nafziger, Kandee Keen, NP Taking Active   mirabegron ER (MYRBETRIQ) 50 MG TB24 tablet [id number] No Take 1 tablet (50 mg total) by mouth daily.  Patient not taking: Reported on 09/06/2023   Shirline Frees, NP Not Taking Active   Multiple Vitamin (MULTIVITAMIN WITH MINERALS) TABS tablet [id number] Yes Take 1 tablet by mouth daily. 50 + [provider] Taking Active Self  Multiple Vitamins-Minerals (PRESERVISION AREDS 2 PO) [id number] Yes Take 1 capsule by mouth 2 (two) times daily. [provider] Taking Active Self  naproxen sodium (ALEVE) 220 MG tablet [id number] No Take 220 mg by mouth daily as needed (pain).  Patient not taking: Reported on 09/06/2023   [provider] Not Taking Active Self  pantoprazole (PROTONIX) 40 MG tablet [id number] Yes Take 1 tablet (40 mg total) by mouth 2 (two) times daily. Please call [phone] to schedule an office visit for more refills. Cirigliano, Vito V, DO Taking Active   temazepam (RESTORIL) 15 MG capsule [id number] Yes Take 1 capsule (15 mg total) by mouth at bedtime. Cleotis Nipper, MD Taking Active   VASCEPA 1 g capsule [id number]  TAKE TWO CAPSULES BY MOUTH TWICE DAILY Nafziger, Kandee Keen, NP  Active   Vitamin D, Cholecalciferol, 1000 UNITS TABS [id number] Yes Take 1,000 Units by mouth daily.  [provider] Taking Active Self              Assessment/Plan:   Hyperlipidemia/ASCVD Risk Reduction: - Currently uncontrolled.  - Reviewed long term complications of uncontrolled cholesterol - Reviewed dietary recommendations including low fat/high fiber  diet - Recommend to consider alternative medications but patient declines any new meds other than the vascepa at this time     Follow Up Plan: 6 months  Sherrill Raring, PharmD Clinical Pharmacist [phone]

## 2023-09-21 ENCOUNTER — Other Ambulatory Visit: Payer: Self-pay

## 2023-09-21 ENCOUNTER — Encounter (HOSPITAL_COMMUNITY): Payer: Self-pay

## 2023-09-21 ENCOUNTER — Ambulatory Visit (HOSPITAL_BASED_OUTPATIENT_CLINIC_OR_DEPARTMENT_OTHER): Payer: PPO

## 2023-09-21 VITALS — BP 129/79 | HR 70 | Ht 69.0 in | Wt 268.0 lb

## 2023-09-21 DIAGNOSIS — F3131 Bipolar disorder, current episode depressed, mild: Secondary | ICD-10-CM

## 2023-09-21 DIAGNOSIS — F319 Bipolar disorder, unspecified: Secondary | ICD-10-CM | POA: Diagnosis not present

## 2023-09-21 NOTE — Progress Notes (Signed)
 Patient arrives today for his due injection of Abilify  Maintenna 400 mg. Patient is well groomed and in a good mood. Patient states that he is going to the ortho after this appointment as he has hurt his shoulder again and is worried it may need surgery. Patient denies SI/HI or AVH. Injection was prepared as ordered and administered in patients LUOQ. Patient tolerated well and without complaint. He will return in a month for his next due injection.      NDC: 40851-927-07 LOT: [id number] EXP: APR 2027

## 2023-09-22 ENCOUNTER — Other Ambulatory Visit: Payer: Self-pay | Admitting: Gastroenterology

## 2023-09-25 DIAGNOSIS — M19012 Primary osteoarthritis, left shoulder: Secondary | ICD-10-CM | POA: Diagnosis not present

## 2023-09-27 ENCOUNTER — Encounter: Payer: Self-pay | Admitting: Adult Health

## 2023-09-27 DIAGNOSIS — M25812 Other specified joint disorders, left shoulder: Secondary | ICD-10-CM | POA: Diagnosis not present

## 2023-09-28 NOTE — Telephone Encounter (Signed)
PPW filled out and faxed

## 2023-10-12 DIAGNOSIS — M4316 Spondylolisthesis, lumbar region: Secondary | ICD-10-CM | POA: Diagnosis not present

## 2023-10-12 DIAGNOSIS — Z6838 Body mass index (BMI) 38.0-38.9, adult: Secondary | ICD-10-CM | POA: Diagnosis not present

## 2023-10-17 ENCOUNTER — Other Ambulatory Visit: Payer: Self-pay | Admitting: Gastroenterology

## 2023-10-19 ENCOUNTER — Other Ambulatory Visit: Payer: Self-pay

## 2023-10-19 ENCOUNTER — Encounter (HOSPITAL_COMMUNITY): Payer: Self-pay

## 2023-10-19 ENCOUNTER — Ambulatory Visit (HOSPITAL_BASED_OUTPATIENT_CLINIC_OR_DEPARTMENT_OTHER): Payer: PPO

## 2023-10-19 VITALS — BP 121/76 | HR 69 | Ht 69.0 in | Wt 260.0 lb

## 2023-10-19 DIAGNOSIS — F3131 Bipolar disorder, current episode depressed, mild: Secondary | ICD-10-CM

## 2023-10-19 DIAGNOSIS — F319 Bipolar disorder, unspecified: Secondary | ICD-10-CM

## 2023-10-19 NOTE — Progress Notes (Signed)
Patient arrives today in a pleasant mood and well groomed for his monthly injection of Abilify maintenna 400 mg. Patient is glad that the cold is finally gone. Injection was prepared as ordered and administered in patients RUOQ. Patient tolerated well and without complaint and will return in 28 days for his next due injection.    NDC: 14782-956-21 LOT: [id number] EXP: APR 2027

## 2023-10-19 NOTE — H&P (Signed)
Patient's anticipated LOS is less than 2 midnights, meeting these requirements: - Younger than 60 - Lives within 1 hour of care - Has a competent adult at home to recover with post-op recover - NO history of  - Chronic pain requiring opiods  - Diabetes  - Coronary Artery Disease  - Heart failure  - Heart attack  - Stroke  - DVT/VTE  - Cardiac arrhythmia  - Respiratory Failure/COPD  - Renal failure  - Anemia  - Advanced Liver disease     CODI KERTZ is an 83 y.o. male.    Chief Complaint: left shoulder pain  HPI: Pt is a 83 y.o. male complaining of left shoulder pain for multiple years. Pain had continually increased since the beginning. X-rays in the clinic show end-stage arthritic changes of the left shoulder. Pt has tried various conservative treatments which have failed to alleviate their symptoms, including injections and therapy. Various options are discussed with the patient. Risks, benefits and expectations were discussed with the patient. Patient understand the risks, benefits and expectations and wishes to proceed with surgery.   PCP:  Shirline Frees, NP  D/C Plans: Home  PMH: Past Medical History:  Diagnosis Date   Anal fissure    Anxiety    Arthritis    right wrist; pt. states "everywhere"   Asthma    Bipolar disorder (HCC)    Cataract    Depression    Difficult airway for intubation    GERD (gastroesophageal reflux disease)    Hemorrhage of colon following colonoscopy 10/08/2019   History of kidney stones    History of MRSA infection    left knee after arthroplasty   Hyperlipidemia    Hypertension    states under control with med., has been on med. x 1 yr.   Impaired hearing    Left bundle branch block (LBBB) on electrocardiogram 10/29/2015   Macular degeneration (senile) of retina    left   Pre-diabetes     PSH: Past Surgical History:  Procedure Laterality Date   ANKLE FUSION Bilateral    ANKLE SURGERY Bilateral    ligament surgery    BIOPSY  04/29/2020   Procedure: BIOPSY;  Surgeon: Shellia Cleverly, DO;  Location: [facility];  Service: Gastroenterology;;   BIOPSY  07/14/2022   Procedure: BIOPSY;  Surgeon: Shellia Cleverly, DO;  Location: [facility];  Service: Gastroenterology;;   CARDIAC CATHETERIZATION  x 2   1983; 11/14/2003   CARPOMETACARPEL SUSPENSION PLASTY Right 09/22/2016   Procedure: right thumb carpometacarpal  ARTHROPLASTY;  Surgeon: Betha Loa, MD;  Location: [facility];  Service: Orthopedics;  Laterality: Right;  right thumb carpometacarpal  ARTHROPLASTY   CARPOMETACARPEL SUSPENSION PLASTY Right 11/02/2017   Procedure: RIGHT THUMB SUSPENSIONPLASTY WITH TIGHTROPE, DISTAL POLE SCAPHOID  EXCISION;  Surgeon: Betha Loa, MD;  Location: [facility];  Service: Orthopedics;  Laterality: Right;   COLONOSCOPY WITH PROPOFOL N/A 10/02/2019   Procedure: COLONOSCOPY WITH PROPOFOL;  Surgeon: Shellia Cleverly, DO;  Location: [facility];  Service: Gastroenterology;  Laterality: N/A;   COLONOSCOPY WITH PROPOFOL N/A 10/09/2019   Procedure: COLONOSCOPY WITH PROPOFOL;  Surgeon: Iva Boop, MD;  Location: [facility];  Service: Endoscopy;  Laterality: N/A;   COLONOSCOPY WITH PROPOFOL N/A 04/29/2020   Procedure: COLONOSCOPY WITH PROPOFOL;  Surgeon: Shellia Cleverly, DO;  Location: [facility];  Service: Gastroenterology;  Laterality: N/A;   COLONOSCOPY WITH PROPOFOL N/A 11/11/2021   Procedure: COLONOSCOPY WITH PROPOFOL;  Surgeon: Doristine Locks  V, DO;  Location: [facility];  Service: Gastroenterology;  Laterality: N/A;   ELBOW SURGERY Left    ENDOSCOPIC MUCOSAL RESECTION N/A 10/02/2019   Procedure: ENDOSCOPIC MUCOSAL RESECTION;  Surgeon: Shellia Cleverly, DO;  Location: [facility];  Service: Gastroenterology;  Laterality: N/A;   ESOPHAGOGASTRODUODENOSCOPY (EGD) WITH PROPOFOL N/A 07/14/2022   Procedure: ESOPHAGOGASTRODUODENOSCOPY (EGD) WITH PROPOFOL;  Surgeon: Shellia Cleverly, DO;  Location: [facility];  Service: Gastroenterology;  Laterality: N/A;   HEMOSTASIS CLIP PLACEMENT  10/09/2019   Procedure: HEMOSTASIS CLIP PLACEMENT;  Surgeon: Iva Boop, MD;  Location: [facility];  Service: Endoscopy;;   HEMOSTASIS CLIP PLACEMENT  11/11/2021   Procedure: HEMOSTASIS CLIP PLACEMENT;  Surgeon: Shellia Cleverly, DO;  Location: [facility];  Service: Gastroenterology;;   HEMOSTASIS CLIP PLACEMENT  07/14/2022   Procedure: HEMOSTASIS CLIP PLACEMENT;  Surgeon: Shellia Cleverly, DO;  Location: [facility];  Service: Gastroenterology;;   INGUINAL HERNIA REPAIR Right    KNEE SURGERY     OPEN REDUCTION INTERNAL FIXATION (ORIF) DISTAL RADIAL FRACTURE Right 10/29/2015   Procedure: OPEN REDUCTION INTERNAL FIXATION (ORIF) DISTAL RADIAL FRACTURE;  Surgeon: Betha Loa, MD;  Location: [facility];  Service: Orthopedics;  Laterality: Right;   ORIF ELBOW FRACTURE Right    POLYPECTOMY  04/29/2020   Procedure: POLYPECTOMY;  Surgeon: Shellia Cleverly, DO;  Location: [facility];  Service: Gastroenterology;;   POLYPECTOMY  11/11/2021   Procedure: POLYPECTOMY;  Surgeon: Shellia Cleverly, DO;  Location: [facility];  Service: Gastroenterology;;   POLYPECTOMY  07/14/2022   Procedure: POLYPECTOMY;  Surgeon: Shellia Cleverly, DO;  Location: [facility];  Service: Gastroenterology;;   Gaspar Bidding DILATION N/A 07/14/2022   Procedure: SAVORY DILATION;  Surgeon: Shellia Cleverly, DO;  Location: [facility];  Service: Gastroenterology;  Laterality: N/A;   SUBMUCOSAL LIFTING INJECTION  10/02/2019   Procedure: SUBMUCOSAL LIFTING INJECTION;  Surgeon: Shellia Cleverly, DO;  Location: [facility];  Service: Gastroenterology;;   TONSILLECTOMY     TOTAL KNEE ARTHROPLASTY Bilateral    TOTAL KNEE REVISION Right 08/25/2021   Procedure: Right knee polyethylene vs total knee arthroplasty revision;  Surgeon: Ollen Gross, MD;  Location: [facility];  Service: Orthopedics;   Laterality: Right;   ULNAR COLLATERAL LIGAMENT REPAIR Right 12/29/2015   Procedure: REPAIR  RIGHT LATERAL ULNAR COLLATERAL LIGAMENT TEAR  EXTENSOR ORIGIN ;  Surgeon: Betha Loa, MD;  Location: [facility];  Service: Orthopedics;  Laterality: Right;   WRIST ARTHROSCOPY WITH DEBRIDEMENT Right 07/14/2016   Procedure: RIGHT WRIST ARTHROSCOPY WITH DEBRIDEMENT  TRIANGULAR FIBROCARTILAGE COMPLEX;  Surgeon: Betha Loa, MD;  Location: [facility];  Service: Orthopedics;  Laterality: Right;    Social History:  reports that he has never smoked. He has never used smokeless tobacco. He reports that he does not currently use alcohol. He reports that he does not use drugs. BMI: Estimated body mass index is 39.58 kg/m as calculated from the following:   Height as of 09/21/23: 5\' 9"  (1.753 m).   Weight as of 09/21/23: 121.6 kg.  Lab Results  Component Value Date   ALBUMIN 4.5 05/05/2022   Diabetes: Patient does not have a diagnosis of diabetes.     Smoking Status:   reports that he has never smoked. He has never used smokeless tobacco.    Allergies:  Allergies  Allergen Reactions   Penicillins Other (See Comments)    BLISTERS Did it involve swelling of the face/tongue/throat, SOB, or low BP? No Did  it involve sudden or severe rash/hives, skin peeling, or any reaction on the inside of your mouth or nose? No Did you need to seek medical attention at a hospital or doctor's office? No When did it last happen? around 1980    If all above answers are "NO", may proceed with cephalosporin use. Tolerated Cephalosporin Date: 08/26/21.     Lisinopril Diarrhea and Cough    Dry cough, abdominal bloating and diarrhea   Zetia [Ezetimibe]     Myalgia    Codeine Rash    Medications: Current Facility-Administered Medications  Medication Dose Route Frequency Provider Last Rate Last Admin   ARIPiprazole ER (ABILIFY MAINTENA) 400 MG prefilled syringe 400 mg  400 mg Intramuscular  Q28 days Arfeen, Phillips Grout, MD   400 mg at 09/21/23 1126   Current Outpatient Medications  Medication Sig Dispense Refill   albuterol (VENTOLIN HFA) 108 (90 Base) MCG/ACT inhaler Inhale 2 puffs into the lungs every 6 (six) hours as needed for wheezing or shortness of breath. 6.7 g 3   ARIPiprazole ER (ABILIFY MAINTENA) 400 MG PRSY prefilled syringe Inject 400 mg into the muscle every 28 (twenty-eight) days.     azelastine (ASTELIN) 0.1 % nasal spray Place 2 sprays into both nostrils 2 (two) times daily. Use in each nostril as directed 30 mL 6   cetirizine (ZYRTEC) 10 MG tablet Take 1 tablet (10 mg total) by mouth every morning. 90 tablet 0   citalopram (CELEXA) 20 MG tablet Take 1 tablet (20 mg total) by mouth daily. 90 tablet 0   Continuous Blood Gluc Receiver (FREESTYLE LIBRE 2 READER) DEVI Use with libre sensors (Patient not taking: Reported on 09/06/2023) 1 each 0   Continuous Blood Gluc Sensor (FREESTYLE LIBRE 2 SENSOR) MISC 1 Device by Does not apply route every 14 (fourteen) days. (Patient not taking: Reported on 09/06/2023) 2 each 11   fenofibrate (TRICOR) 145 MG tablet Take 1 tablet (145 mg total) by mouth daily. 90 tablet 0   gabapentin (NEURONTIN) 100 MG capsule Take one capsule (100 mg total) by mouth twice daily. (Patient not taking: Reported on 09/06/2023) 180 capsule 0   HYDROcodone bit-homatropine (HYCODAN) 5-1.5 MG/5ML syrup Take 5 mLs by mouth every 8 (eight) hours as needed for cough. (Patient not taking: Reported on 09/06/2023) 120 mL 0   ipratropium-albuterol (DUONEB) 0.5-2.5 (3) MG/3ML SOLN INHALE 3 ML BY NEBULIZER EVERY 6 HOURS AS NEEDED 360 mL 0   losartan (COZAAR) 50 MG tablet Take 1 tablet (50 mg total) by mouth every morning. 90 tablet 0   metFORMIN (GLUCOPHAGE) 500 MG tablet Take 1 tablet (500 mg total) by mouth daily. 90 tablet 0   mirabegron ER (MYRBETRIQ) 50 MG TB24 tablet Take 1 tablet (50 mg total) by mouth daily. (Patient not taking: Reported on 09/06/2023) 90 tablet 3    Multiple Vitamin (MULTIVITAMIN WITH MINERALS) TABS tablet Take 1 tablet by mouth daily. 50 +     Multiple Vitamins-Minerals (PRESERVISION AREDS 2 PO) Take 1 capsule by mouth 2 (two) times daily.     naproxen sodium (ALEVE) 220 MG tablet Take 220 mg by mouth daily as needed (pain). (Patient not taking: Reported on 09/06/2023)     pantoprazole (PROTONIX) 40 MG tablet TAKE 1 TABLET BY MOUTH 2 (TWO) TIMES DAILY. PLEASE KEEP APPOINTMENT SCHEDULED FOR 12-04-23 AT 840AM 180 tablet 0   temazepam (RESTORIL) 15 MG capsule Take 1 capsule (15 mg total) by mouth at bedtime. 90 capsule 0   VASCEPA 1 g  capsule TAKE TWO CAPSULES BY MOUTH TWICE DAILY 360 capsule 3   Vitamin D, Cholecalciferol, 1000 UNITS TABS Take 1,000 Units by mouth daily.       No results found for this or any previous visit (from the past 48 hours). No results found.  ROS: Pain with rom of the left upper extremity  Physical Exam: Alert and oriented 83 y.o. male in no acute distress Cranial nerves 2-12 intact Cervical spine: full rom with no tenderness, nv intact distally Chest: active breath sounds bilaterally, no wheeze rhonchi or rales Heart: regular rate and rhythm, no murmur Abd: non tender non distended with active bowel sounds Hip is stable with rom  Left shoulder painful and weak rom Nv intact distally No rashes or edema distally  Assessment/Plan Assessment: left shoulder cuff arthropathy  Plan:  Patient will undergo a left reverse total shoulder by Dr. Ranell Patrick at [facility] Risks benefits and expectations were discussed with the patient. Patient understand risks, benefits and expectations and wishes to proceed. Preoperative templating of the joint replacement has been completed, documented, and submitted to the Operating Room personnel in order to optimize intra-operative equipment management.   Alphonsa Overall PA-C, MPAS [facility] is now [employer] [address], [address], [place], [place]  [address] Phone: [phone] [email or web address] Facebook  [employer]

## 2023-10-21 DIAGNOSIS — M47816 Spondylosis without myelopathy or radiculopathy, lumbar region: Secondary | ICD-10-CM | POA: Diagnosis not present

## 2023-10-23 ENCOUNTER — Telehealth (HOSPITAL_BASED_OUTPATIENT_CLINIC_OR_DEPARTMENT_OTHER): Payer: PPO | Admitting: Psychiatry

## 2023-10-23 ENCOUNTER — Encounter (HOSPITAL_COMMUNITY): Payer: Self-pay | Admitting: Psychiatry

## 2023-10-23 VITALS — Wt 263.0 lb

## 2023-10-23 DIAGNOSIS — F5101 Primary insomnia: Secondary | ICD-10-CM

## 2023-10-23 DIAGNOSIS — F319 Bipolar disorder, unspecified: Secondary | ICD-10-CM | POA: Diagnosis not present

## 2023-10-23 DIAGNOSIS — F411 Generalized anxiety disorder: Secondary | ICD-10-CM

## 2023-10-23 MED ORDER — TEMAZEPAM 15 MG PO CAPS
15.0000 mg | ORAL_CAPSULE | Freq: Every day | ORAL | 0 refills | Status: DC
Start: 2023-10-23 — End: 2024-01-30

## 2023-10-23 MED ORDER — ARIPIPRAZOLE ER 400 MG IM PRSY: 400.0000 mg | PREFILLED_SYRINGE | INTRAMUSCULAR | Status: DC

## 2023-10-23 MED ORDER — CITALOPRAM HYDROBROMIDE 20 MG PO TABS
20.0000 mg | ORAL_TABLET | Freq: Every day | ORAL | 0 refills | Status: DC
Start: 2023-10-23 — End: 2024-01-30

## 2023-10-23 NOTE — Progress Notes (Signed)
[facility] MD Virtual Progress Note   Patient Location: Home Provider Location: Home Office  I connect with patient by telephone and verified that I am speaking with correct person by using two identifiers. I discussed the limitations of evaluation and management by telemedicine and the availability of in person appointments. I also discussed with the patient that there may be a patient responsible charge related to this service. The patient expressed understanding and agreed to proceed.  Sean Hill [id number] 83 y.o.  10/23/2023 10:22 AM  History of Present Illness:  Patient is evaluated by phone session.  He could not do video today.  Patient told he has some anxiety because upcoming shoulder surgery and after that he will have back surgery.  However he feels the medicine is working.  He is taking Celexa, Abilify.  He is taking temazepam at night which helps some of his anxiety and gave few hours of sleep.  He is getting injection of Abilify regularly.  He denies any mania, psychosis, hallucination or any irritability.  Started him on gabapentin few months ago but he was complaining of poor attention concentration and memory issues and we recommended to discontinue.  He noticed much improvement in his attention concentration after he stopped the gabapentin.  He does not feel his anxiety is getting worse but realized it could be situational because of coming procedure.  He had a good Christmas.  His wife is very supportive.  Patient told he had cut down his driving and wants to stop driving because he is 83 years old and does not want to drive and like not to renew his license.  Denies drinking or using any illegal substances.  He does not want to change his medication since it is working well.  He has no tremors.  He has dry mouth but it is not as bad.  Past Psychiatric History: H/O multiple hospitalization.  Last inpatient in July 2014. H/O overdose on Latuda with alcohol.  H/O cutting wrist.  Took Tegretol, Depakote, Ambien, Remeron, Vistaril, Geodon, Abilify, lithium, Provigil, Zoloft, Neurontin, Lamictal, Wellbutrin, Risperdal and Pristiq. H/O mania, aggression, getting speeding tickets, excessive buying and impulsive behavior    Outpatient Encounter Medications as of 10/23/2023  Medication Sig   albuterol (VENTOLIN HFA) 108 (90 Base) MCG/ACT inhaler Inhale 2 puffs into the lungs every 6 (six) hours as needed for wheezing or shortness of breath.   ARIPiprazole ER (ABILIFY MAINTENA) 400 MG PRSY prefilled syringe Inject 400 mg into the muscle every 28 (twenty-eight) days.   azelastine (ASTELIN) 0.1 % nasal spray Place 2 sprays into both nostrils 2 (two) times daily. Use in each nostril as directed   cetirizine (ZYRTEC) 10 MG tablet Take 1 tablet (10 mg total) by mouth every morning.   citalopram (CELEXA) 20 MG tablet Take 1 tablet (20 mg total) by mouth daily.   Continuous Blood Gluc Receiver (FREESTYLE LIBRE 2 READER) DEVI Use with libre sensors (Patient not taking: Reported on 09/06/2023)   Continuous Blood Gluc Sensor (FREESTYLE LIBRE 2 SENSOR) MISC 1 Device by Does not apply route every 14 (fourteen) days. (Patient not taking: Reported on 09/06/2023)   fenofibrate (TRICOR) 145 MG tablet Take 1 tablet (145 mg total) by mouth daily.   gabapentin (NEURONTIN) 100 MG capsule Take one capsule (100 mg total) by mouth twice daily. (Patient not taking: Reported on 09/06/2023)   HYDROcodone bit-homatropine (HYCODAN) 5-1.5 MG/5ML syrup Take 5 mLs by mouth every 8 (eight) hours as needed for cough. (Patient  not taking: Reported on 09/06/2023)   ipratropium-albuterol (DUONEB) 0.5-2.5 (3) MG/3ML SOLN INHALE 3 ML BY NEBULIZER EVERY 6 HOURS AS NEEDED   losartan (COZAAR) 50 MG tablet Take 1 tablet (50 mg total) by mouth every morning.   metFORMIN (GLUCOPHAGE) 500 MG tablet Take 1 tablet (500 mg total) by mouth daily.   mirabegron ER (MYRBETRIQ) 50 MG TB24 tablet Take 1 tablet (50  mg total) by mouth daily. (Patient not taking: Reported on 09/06/2023)   Multiple Vitamin (MULTIVITAMIN WITH MINERALS) TABS tablet Take 1 tablet by mouth daily. 50 +   Multiple Vitamins-Minerals (PRESERVISION AREDS 2 PO) Take 1 capsule by mouth 2 (two) times daily.   naproxen sodium (ALEVE) 220 MG tablet Take 220 mg by mouth daily as needed (pain). (Patient not taking: Reported on 09/06/2023)   pantoprazole (PROTONIX) 40 MG tablet TAKE 1 TABLET BY MOUTH 2 (TWO) TIMES DAILY. PLEASE KEEP APPOINTMENT SCHEDULED FOR 12-04-23 AT 840AM   temazepam (RESTORIL) 15 MG capsule Take 1 capsule (15 mg total) by mouth at bedtime.   VASCEPA 1 g capsule TAKE TWO CAPSULES BY MOUTH TWICE DAILY   Vitamin D, Cholecalciferol, 1000 UNITS TABS Take 1,000 Units by mouth daily.    Facility-Administered Encounter Medications as of 10/23/2023  Medication   ARIPiprazole ER (ABILIFY MAINTENA) 400 MG prefilled syringe 400 mg    No results found for this or any previous visit (from the past 2160 hours).   Psychiatric Specialty Exam: Physical Exam  Review of Systems  Weight 263 lb (119.3 kg).There is no height or weight on file to calculate BMI.  General Appearance: NA  Eye Contact:  NA  Speech:  Slow  Volume:  Decreased  Mood:  Euthymic  Affect:  NA  Thought Process:  Goal Directed  Orientation:  Full (Time, Place, and Person)  Thought Content:  Logical  Suicidal Thoughts:  No  Homicidal Thoughts:  No  Memory:  Immediate;   Fair Recent;   Fair Remote;   Fair  Judgement:  Intact  Insight:  Present  Psychomotor Activity:  Decreased  Concentration:  Concentration: Fair and Attention Span: Fair  Recall:  Good  Fund of Knowledge:  Good  Language:  Good  Akathisia:  No  Handed:  Right  AIMS (if indicated):     Assets:  Communication Skills Desire for Improvement Housing Social Support Transportation  ADL's:  Intact  Cognition:  WNL  Sleep:  ok     Assessment/Plan: Bipolar I disorder (HCC) - Plan:  temazepam (RESTORIL) 15 MG capsule, ARIPiprazole ER (ABILIFY MAINTENA) 400 MG PRSY prefilled syringe  GAD (generalized anxiety disorder) - Plan: temazepam (RESTORIL) 15 MG capsule  Primary insomnia - Plan: temazepam (RESTORIL) 15 MG capsule  I reviewed current medication.  He stopped gabapentin after having memory issues.  He like to continue Celexa, Abilify and temazepam.  We discussed if continues to have issues with sleep in the future then should consider sleep study.  Patient agreed with the plan.  Patient was given about upcoming procedure for his shoulder.  Continue Abilify injection 400 mg intramuscular every 28 days, temazepam 15 mg at bedtime and citalopram 20 mg daily.  Discussed medication side effects and benefits.  Recommended to call us back if he has any question or any concern.  Encouraged to continue therapy.  Follow-up in 3 months.   Follow Up Instructions:     I discussed the assessment and treatment plan with the patient. The patient was provided an opportunity to ask questions  and all were answered. The patient agreed with the plan and demonstrated an understanding of the instructions.   The patient was advised to call back or seek an in-person evaluation if the symptoms worsen or if the condition fails to improve as anticipated.    Collaboration of Care: Other provider involved in patient's care AEB notes are available in epic to review  Patient/Guardian was advised Release of Information must be obtained prior to any record release in order to collaborate their care with an outside provider. Patient/Guardian was advised if they have not already done so to contact the registration department to sign all necessary forms in order for [place] to release information regarding their care.   Consent: Patient/Guardian gives verbal consent for treatment and assignment of benefits for services provided during this visit. Patient/Guardian expressed understanding and agreed to proceed.      I provided 19 minutes of non face to face time during this encounter.  Note: This document was prepared by [employer] voice dictation technology and any errors that results from this process are unintentional.    Cleotis Nipper, MD 10/23/2023

## 2023-10-25 ENCOUNTER — Other Ambulatory Visit: Payer: Self-pay | Admitting: Adult Health

## 2023-10-25 ENCOUNTER — Other Ambulatory Visit: Payer: Self-pay | Admitting: Gastroenterology

## 2023-10-25 ENCOUNTER — Other Ambulatory Visit (HOSPITAL_COMMUNITY): Payer: Self-pay | Admitting: Psychiatry

## 2023-10-25 DIAGNOSIS — F411 Generalized anxiety disorder: Secondary | ICD-10-CM

## 2023-10-25 DIAGNOSIS — I10 Essential (primary) hypertension: Secondary | ICD-10-CM

## 2023-10-25 DIAGNOSIS — F319 Bipolar disorder, unspecified: Secondary | ICD-10-CM

## 2023-10-30 NOTE — Progress Notes (Addendum)
COVID Vaccine received:  []  No [x]  Yes Date of any COVID positive Test in last 90 days: no PCP - Shirline Frees NP Cardiologist - Dr. Rollene Rotunda  Chest x-ray - 05/24/23 Epic EKG -  10/31/23 Stress Test -  ECHO - 05/03/22 Epic Cardiac Cath -   Bowel Prep - [x]  No  []   Yes ______  Pacemaker / ICD device [x]  No []  Yes   Spinal Cord Stimulator:[x]  No []  Yes       History of Sleep Apnea? [x]  No []  Yes   CPAP used?- [x]  No []  Yes    Does the patient monitor blood sugar?          [x]  No []  Yes  []  N/A  Patient has: [x]  NO Hx DM   []  Pre-DM                 []  DM1  []   DM2 Does patient have a [employer] or Dexacom? [x]  No []  Yes   Fasting Blood Sugar Ranges-  Checks Blood Sugar _____ times a day  GLP1 agonist / usual dose - no GLP1 instructions:  SGLT-2 inhibitors / usual dose - no SGLT-2 instructions:   Blood Thinner / Instructions:no Aspirin Instructions:no  Comments:   Activity level: Patient is able to climb a flight of stairs without difficulty; [x]  No CP  [x]  No SOB,   Patient can  perform ADLs without assistance.   Anesthesia review: L BBB, HTN,   Patient denies shortness of breath, fever, cough and chest pain at PAT appointment.  Patient verbalized understanding and agreement to the Pre-Surgical Instructions that were given to them at this PAT appointment. Patient was also educated of the need to review these PAT instructions again prior to his/her surgery.I reviewed the appropriate phone numbers to call if they have any and questions or concerns.

## 2023-10-30 NOTE — Patient Instructions (Addendum)
SURGICAL WAITING ROOM VISITATION  Patients having surgery or a procedure may have no more than 2 support people in the waiting area - these visitors may rotate.    Children under the age of 30 must have an adult with them who is not the patient.  Due to an increase in RSV and influenza rates and associated hospitalizations, children ages 20 and under may not visit patients in [facility] hospitals.  Visitors with respiratory illnesses are discouraged from visiting and should remain at home.  If the patient needs to stay at the hospital during part of their recovery, the visitor guidelines for inpatient rooms apply. Pre-op nurse will coordinate an appropriate time for 1 support person to accompany patient in pre-op.  This support person may not rotate.    Please refer to the [facility] website for the visitor guidelines for Inpatients (after your surgery is over and you are in a regular room).       Your procedure is scheduled on: 11/10/23   Report to [facility] Main Entrance    Report to admitting at 11:30 AM   Call this number if you have problems the morning of surgery [phone]   Do not eat food :After Midnight.   After Midnight you may have the following liquids until 11 AM DAY OF SURGERY  Water Non-Citrus Juices (without pulp, NO RED-Apple, White grape, White cranberry) Black Coffee (NO MILK/CREAM OR CREAMERS, sugar ok)  Clear Tea (NO MILK/CREAM OR CREAMERS, sugar ok) regular and decaf                             Plain Jell-O (NO RED)                                           Fruit ices (not with fruit pulp, NO RED)                                     Popsicles (NO RED)                                                               Sports drinks like Gatorade (NO RED)                  The day of surgery:  Drink ONE (1) Pre-Surgery G2 at  11:00 AM the morning of surgery. Drink in one sitting. Do not sip.  This drink was given to you during your hospital   pre-op appointment visit. Nothing else to drink after completing the  Pre-Surgery G2.      Oral Hygiene is also important to reduce your risk of infection.                                    Remember - BRUSH YOUR TEETH THE MORNING OF SURGERY WITH YOUR REGULAR TOOTHPASTE  DENTURES WILL BE REMOVED PRIOR TO SURGERY PLEASE DO NOT APPLY "Poly grip" OR ADHESIVES!!!   Stop all  vitamins and herbal supplements 7 days before surgery.   Take these medicines the morning of surgery with A SIP OF WATER: Cetirizine, Fenofibrate, Pantoprazole, Vascepa  DO NOT TAKE ANY ORAL DIABETIC MEDICATIONS DAY OF YOUR SURGERY Do not take Metformin the morning of surgery.             You may not have any metal on your body including hair pins, jewelry, and body piercing             Do not wear make-up, lotions, powders, perfumes/cologne, or deodorant              Men may shave face and neck.   Do not bring valuables to the hospital. [facility] IS NOT             RESPONSIBLE   FOR VALUABLES.   Contacts, glasses, dentures or bridgework may not be worn into surgery.   Bring small overnight bag day of surgery.   DO NOT BRING YOUR HOME MEDICATIONS TO THE HOSPITAL. PHARMACY WILL DISPENSE MEDICATIONS LISTED ON YOUR MEDICATION LIST TO YOU DURING YOUR ADMISSION IN THE HOSPITAL!    Patients discharged on the day of surgery will not be allowed to drive home.  Someone NEEDS to stay with you for the first 24 hours after anesthesia.   Special Instructions: Bring a copy of your healthcare power of attorney and living will documents the day of surgery if you haven't scanned them before.              Please read over the following fact sheets you were given: IF YOU HAVE QUESTIONS ABOUT YOUR PRE-OP INSTRUCTIONS PLEASE CALL [phone] [facility]   If you received a COVID test during your pre-op visit  it is requested that you wear a mask when out in public, stay away from anyone that may not be feeling well and notify your  surgeon if you develop symptoms. If you test positive for Covid or have been in contact with anyone that has tested positive in the last 10 days please notify you surgeon.      Pre-operative 5 CHG Bath Instructions   You can play a key role in reducing the risk of infection after surgery. Your skin needs to be as free of germs as possible. You can reduce the number of germs on your skin by washing with CHG (chlorhexidine gluconate) soap before surgery. CHG is an antiseptic soap that kills germs and continues to kill germs even after washing.   DO NOT use if you have an allergy to chlorhexidine/CHG or antibacterial soaps. If your skin becomes reddened or irritated, stop using the CHG and notify one of our RNs at [phone].   Please shower with the CHG soap starting 4 days before surgery using the following schedule:     Please keep in mind the following:  DO NOT shave, including legs and underarms, starting the day of your first shower.   You may shave your face at any point before/day of surgery.  Place clean sheets on your bed the day you start using CHG soap. Use a clean washcloth (not used since being washed) for each shower. DO NOT sleep with pets once you start using the CHG.   CHG Shower Instructions:  If you choose to wash your hair and private area, wash first with your normal shampoo/soap.  After you use shampoo/soap, rinse your hair and body thoroughly to remove shampoo/soap residue.  Turn the water OFF and apply about  3 tablespoons (45 ml) of CHG soap to a CLEAN washcloth.  Apply CHG soap ONLY FROM YOUR NECK DOWN TO YOUR TOES (washing for 3-5 minutes)  DO NOT use CHG soap on face, private areas, open wounds, or sores.  Pay special attention to the area where your surgery is being performed.  If you are having back surgery, having someone wash your back for you may be helpful. Wait 2 minutes after CHG soap is applied, then you may rinse off the CHG soap.  Pat dry with a  clean towel  Put on clean clothes/pajamas   If you choose to wear lotion, please use ONLY the CHG-compatible lotions on the back of this paper.     Additional instructions for the day of surgery: DO NOT APPLY any lotions, deodorants, cologne, or perfumes.   Put on clean/comfortable clothes.  Brush your teeth.  Ask your nurse before applying any prescription medications to the skin.      CHG Compatible Lotions   Aveeno Moisturizing lotion  Cetaphil Moisturizing Cream  Cetaphil Moisturizing Lotion  Clairol Herbal Essence Moisturizing Lotion, Dry Skin  Clairol Herbal Essence Moisturizing Lotion, Extra Dry Skin  Clairol Herbal Essence Moisturizing Lotion, Normal Skin  Curel Age Defying Therapeutic Moisturizing Lotion with Alpha Hydroxy  Curel Extreme Care Body Lotion  Curel Soothing Hands Moisturizing Hand Lotion  Curel Therapeutic Moisturizing Cream, Fragrance-Free  Curel Therapeutic Moisturizing Lotion, Fragrance-Free  Curel Therapeutic Moisturizing Lotion, Original Formula  Eucerin Daily Replenishing Lotion  Eucerin Dry Skin Therapy Plus Alpha Hydroxy Crme  Eucerin Dry Skin Therapy Plus Alpha Hydroxy Lotion  Eucerin Original Crme  Eucerin Original Lotion  Eucerin Plus Crme Eucerin Plus Lotion  Eucerin TriLipid Replenishing Lotion  Keri Anti-Bacterial Hand Lotion  Keri Deep Conditioning Original Lotion Dry Skin Formula Softly Scented  Keri Deep Conditioning Original Lotion, Fragrance Free Sensitive Skin Formula  Keri Lotion Fast Absorbing Fragrance Free Sensitive Skin Formula  Keri Lotion Fast Absorbing Softly Scented Dry Skin Formula  Keri Original Lotion  Keri Skin Renewal Lotion Keri Silky Smooth Lotion  Keri Silky Smooth Sensitive Skin Lotion  Nivea Body Creamy Conditioning Oil  Nivea Body Extra Enriched Lotion  Nivea Body Original Lotion  Nivea Body Sheer Moisturizing Lotion Nivea Crme  Nivea Skin Firming Lotion  NutraDerm 30 Skin Lotion  NutraDerm Skin  Lotion  NutraDerm Therapeutic Skin Cream  NutraDerm Therapeutic Skin Lotion  ProShield Protective Hand Cream    Incentive Spirometer  An incentive spirometer is a tool that can help keep your lungs clear and active. This tool measures how well you are filling your lungs with each breath. Taking long deep breaths may help reverse or decrease the chance of developing breathing (pulmonary) problems (especially infection) following: A long period of time when you are unable to move or be active. BEFORE THE PROCEDURE  If the spirometer includes an indicator to show your best effort, your nurse or respiratory therapist will set it to a desired goal. If possible, sit up straight or lean slightly forward. Try not to slouch. Hold the incentive spirometer in an upright position. INSTRUCTIONS FOR USE  Sit on the edge of your bed if possible, or sit up as far as you can in bed or on a chair. Hold the incentive spirometer in an upright position. Breathe out normally. Place the mouthpiece in your mouth and seal your lips tightly around it. Breathe in slowly and as deeply as possible, raising the piston or the ball toward the top of the  column. Hold your breath for 3-5 seconds or for as long as possible. Allow the piston or ball to fall to the bottom of the column. Remove the mouthpiece from your mouth and breathe out normally. Rest for a few seconds and repeat Steps 1 through 7 at least 10 times every 1-2 hours when you are awake. Take your time and take a few normal breaths between deep breaths. The spirometer may include an indicator to show your best effort. Use the indicator as a goal to work toward during each repetition. After each set of 10 deep breaths, practice coughing to be sure your lungs are clear. If you have an incision (the cut made at the time of surgery), support your incision when coughing by placing a pillow or rolled up towels firmly against it. Once you are able to get out of bed,  walk around indoors and cough well. You may stop using the incentive spirometer when instructed by your caregiver.  RISKS AND COMPLICATIONS Take your time so you do not get dizzy or light-headed. If you are in pain, you may need to take or ask for pain medication before doing incentive spirometry. It is harder to take a deep breath if you are having pain. AFTER USE Rest and breathe slowly and easily. It can be helpful to keep track of a log of your progress. Your caregiver can provide you with a simple table to help with this. If you are using the spirometer at home, follow these instructions: SEEK MEDICAL CARE IF:  You are having difficultly using the spirometer. You have trouble using the spirometer as often as instructed. Your pain medication is not giving enough relief while using the spirometer. You develop fever of 100.5 F (38.1 C) or higher. SEEK IMMEDIATE MEDICAL CARE IF:  You cough up bloody sputum that had not been present before. You develop fever of 102 F (38.9 C) or greater. You develop worsening pain at or near the incision site. MAKE SURE YOU:  Understand these instructions. Will watch your condition. Will get help right away if you are not doing well or get worse. Document Released: 01/16/2007 Document Revised: 11/28/2011 Document Reviewed: 03/19/2007 [facility] Patient Information 2014 [place], [place]- Preparing for Total Shoulder Arthroplasty    Before surgery, you can play an important role. Because skin is not sterile, your skin needs to be as free of germs as possible. You can reduce the number of germs on your skin by using the following products. Benzoyl Peroxide Gel Reduces the number of germs present on the skin Applied twice a day to shoulder area starting two days before surgery    ==================================================================  Please follow these instructions carefully:  BENZOYL PEROXIDE 5% GEL  Please do not use if you  have an allergy to benzoyl peroxide.   If your skin becomes reddened/irritated stop using the benzoyl peroxide.  Starting two days before surgery, apply as follows: Apply benzoyl peroxide in the morning and at night. Apply after taking a shower. If you are not taking a shower clean entire shoulder front, back, and side along with the armpit with a clean wet washcloth.  Place a quarter-sized dollop on your shoulder and rub in thoroughly, making sure to cover the front, back, and side of your shoulder, along with the armpit.   2 days before ____ AM   ____ PM              1 day before ____ AM   ____ PM  Do this twice a day for two days.  (Last application is the night before surgery, AFTER using the CHG soap as described below).  Do NOT apply benzoyl peroxide gel on the day of surgery.

## 2023-10-31 ENCOUNTER — Other Ambulatory Visit: Payer: Self-pay

## 2023-10-31 ENCOUNTER — Encounter (HOSPITAL_COMMUNITY): Payer: Self-pay

## 2023-10-31 ENCOUNTER — Encounter (HOSPITAL_COMMUNITY)
Admission: RE | Admit: 2023-10-31 | Discharge: 2023-10-31 | Disposition: A | Discharge status: Home / Self Care | Payer: PPO | Source: Ambulatory Visit | Attending: Orthopedic Surgery

## 2023-10-31 VITALS — BP 145/72 | HR 66 | Temp 98.2°F | Resp 18 | Ht 70.0 in | Wt 268.0 lb

## 2023-10-31 DIAGNOSIS — Z01812 Encounter for preprocedural laboratory examination: Secondary | ICD-10-CM | POA: Diagnosis present

## 2023-10-31 DIAGNOSIS — Z01818 Encounter for other preprocedural examination: Secondary | ICD-10-CM | POA: Diagnosis not present

## 2023-10-31 DIAGNOSIS — Z01810 Encounter for preprocedural cardiovascular examination: Secondary | ICD-10-CM | POA: Diagnosis present

## 2023-10-31 DIAGNOSIS — R9431 Abnormal electrocardiogram [ECG] [EKG]: Secondary | ICD-10-CM | POA: Insufficient documentation

## 2023-10-31 DIAGNOSIS — I10 Essential (primary) hypertension: Secondary | ICD-10-CM | POA: Diagnosis not present

## 2023-10-31 LAB — SURGICAL PCR SCREEN
MRSA, PCR: NEGATIVE
Staphylococcus aureus: POSITIVE — AB

## 2023-10-31 LAB — BASIC METABOLIC PANEL
Anion gap: 11 (ref 5–15)
BUN: 13 mg/dL (ref 8–23)
CO2: 23 mmol/L (ref 22–32)
Calcium: 9.6 mg/dL (ref 8.9–10.3)
Chloride: 104 mmol/L (ref 98–111)
Creatinine, Ser: 1.01 mg/dL (ref 0.61–1.24)
GFR, Estimated: >60 mL/min (ref >60)
Glucose, Bld: 97 mg/dL (ref 70–99)
Potassium: 3.9 mmol/L (ref 3.5–5.1)
Sodium: 138 mmol/L (ref 135–145)

## 2023-10-31 LAB — CBC
HCT: 42.6 % (ref 39.0–52.0)
Hemoglobin: 14.3 g/dL (ref 13.0–17.0)
MCH: 29.7 pg (ref 26.0–34.0)
MCHC: 33.6 g/dL (ref 30.0–36.0)
MCV: 88.4 fL (ref 80.0–100.0)
Platelets: 245 10*3/uL (ref 150–400)
RBC: 4.82 MIL/uL (ref 4.22–5.81)
RDW: 14.2 % (ref 11.5–15.5)
WBC: 5.2 10*3/uL (ref 4.0–10.5)
nRBC: 0 % (ref 0.0–0.2)

## 2023-10-31 NOTE — Progress Notes (Signed)
Please not pre op PCR result from 10/31/23.

## 2023-11-02 ENCOUNTER — Telehealth: Payer: PPO | Admitting: Family Medicine

## 2023-11-02 DIAGNOSIS — R300 Dysuria: Secondary | ICD-10-CM

## 2023-11-02 DIAGNOSIS — M47816 Spondylosis without myelopathy or radiculopathy, lumbar region: Secondary | ICD-10-CM | POA: Diagnosis not present

## 2023-11-02 NOTE — Progress Notes (Signed)
E-Visit for Urinary Problems ? ?Based on what you shared with me, I feel your condition warrants further evaluation and I recommend that you be seen for a face to face office visit.  Male bladder infections are not very common.  We worry about prostate or kidney conditions.  The standard of care is to examine the abdomen and kidneys, and to do a urine and blood test to make sure that something more serious is not going on.  We recommend that you see a provider today.  If your doctor's office is closed [place] has the following Urgent Cares: ? ?  ?NOTE: You will not be charged for this e-visit. ? ?If you are having a true medical emergency please call 911.   ? ?  ? For an urgent face to face visit, [place] has six urgent care centers for your convenience:  ?  ? [facility] at [facility] ?Get Driving Directions ?[phone] ?[phone] ?[place], [place] [address] ?  ? [facility]) ?Get Driving Directions ?[phone] ?[address] ?[place], [place] [address] ? ?[facility]) ?Get Driving Directions ?[phone] ?[address] ?[place],  [place]  [address] ? ?[facility] ?Get Driving Directions ?[phone] ?[address], [address] ?[place], [place] [address] ?  ?[facility] [place] ?Get Driving Directions  ?[phone] ?[address] ?[address] ?[place], [place] [address] ?  ? Urgent Care at [name] ?Get Driving Directions ?[phone] ?[address] ?[place], [place] [address] ? ?Your MyChart E-visit questionnaire answers were reviewed by a board certified advanced clinical practitioner to complete your personal care plan based on your specific symptoms.  Thank you for using e-Visits. ?

## 2023-11-02 NOTE — Progress Notes (Signed)
Case: [id number] Date/Time: 11/10/23 1345   Procedure: REVERSE SHOULDER ARTHROPLASTY (Left: Shoulder) - interscalene block, flip room   Anesthesia type: Choice   Pre-op diagnosis: Left shoulder rotator cuff arthropathy   Location: [facility] ROOM 06 / [facility]   Surgeons: Beverely Low, MD       DISCUSSION: Sean Hill is an 83 year old male who presents to PAT prior to surgery above.  Past medical history significant for hypertension, left bundle branch block, chronic cough and DOE, GERD, prediabetes, arthritis, anxiety, bipolar disorder, hearing impairment  Prior anesthesia complications include difficult intubation (unclear during which surgery).  Last airway note in 2022 notes grade 2 view with MAC 4 with no difficulty.  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  Patient with known history of LBBB and chronic DOE.  He has been evaluated by cardiology in the past for this. Echo in August 2023 demonstrated a low normal EF.  There was mild to moderate TR.  The aorta was estimated to be 41 mm.  Thre was very mild AS.  He had normal coronaries on cath in 2005. Last seen by Dr. Antoine Poche in 2023.  He recommended CTA of the chest due to DOE which showed scattered coronary artery calcifications but otherwise no acute findings.  Thought not to be cardiac etiology. Advised to complete PFTs which were normal.  Referred back to his PCP.  Patient has been evaluated by pulmonology.  Last seen on 9//24 by Dr. Sherene Sires for chronic cough and DOE. Per Dr. Sherene Sires: "most likely this is deconditioning assocw with wt gain / no evidence of airflow obst of any type so try off Adair dpi due to concerns for uacs (see separate a/p) and use neb prn"  Patient seen by PCP on 2/14 due to urinary symptoms. Treated for UTI with Cipro x 7 days. KUB obtained and results pending. Urine culture obtained and there is no growth. Discussed with Dr. Dietrich Pates office who are aware and ok to proceed.   VS: BP (!) 145/72   Pulse 66    Temp 36.8 C (Oral)   Resp 18   Ht 5\' 10"  (1.778 m)   Wt 121.6 kg   SpO2 96%   BMI 38.45 kg/m   PROVIDERS: Shirline Frees, NP   LABS: Labs reviewed: Acceptable for surgery. (all labs ordered are listed, but only abnormal results are displayed)  Labs Reviewed  SURGICAL PCR SCREEN - Abnormal; Notable for the following components:      Result Value   Staphylococcus aureus POSITIVE (*)    All other components within normal limits  BASIC METABOLIC PANEL  CBC     IMAGES: CTA chest 05/31/2022:  IMPRESSION: There is no evidence of central pulmonary artery embolism. There is no evidence of thoracic aortic dissection. There are scattered coronary artery calcifications. There is no focal pulmonary consolidation.   Fatty liver. Gallbladder stones. Enlarged spleen. Small hiatal hernia. Right renal cysts.   Other findings as described in the body of the report.  EKG 10/31/23:  Normal sinus rhythm, rate 66 Left bundle branch block  CV:  Echo 05/03/2022:  IMPRESSIONS    1. Abnormal septal motion from LBBB . Left ventricular ejection fraction, by estimation, is 50 to 55%. The left ventricle has low normal function. The left ventricle has no regional wall motion abnormalities. There is mild left ventricular hypertrophy. Left ventricular diastolic parameters were normal. The average left ventricular global longitudinal strain is -27.0 %. The global longitudinal strain is normal.  2. Right ventricular systolic function is normal. The right ventricular size is normal. There is normal pulmonary artery systolic pressure.  3. Left atrial size was moderately dilated.  4. The mitral valve is abnormal. Trivial mitral valve regurgitation. No evidence of mitral stenosis.  5. Tricuspid valve regurgitation is mild to moderate.  6. The aortic valve is tricuspid. There is mild calcification of the aortic valve. There is mild thickening of the aortic valve. Aortic valve regurgitation  is not visualized. Aortic valve sclerosis is present, with no evidence of aortic valve stenosis.  7. Aortic dilatation noted. There is moderate dilatation of the aortic root, measuring 41 mm. There is mild dilatation of the ascending aorta, measuring 38 mm.  8. The inferior vena cava is normal in size with greater than 50% respiratory variability, suggesting right atrial pressure of 3 mmHg.  LHC 11/14/2003:   IMPRESSION/RECOMMENDATIONS:  1. Minimal coronary disease.  2. Normal left ventricular size and systolic  function.  3. No aortic stenosis of mitral regurgitation.  4. Arch aortogram, no evidence of aortic dissection. The left common carotid     arises from the innominate artery. The left vertebral likely arises from     the arch just proximal to the takeoff of the left subclavian.  Past Medical History:  Diagnosis Date   Anal fissure    Anxiety    Arthritis    right wrist; pt. states "everywhere"   Asthma    Bipolar disorder (HCC)    Cataract    Depression    Difficult airway for intubation    GERD (gastroesophageal reflux disease)    Hemorrhage of colon following colonoscopy 10/08/2019   History of kidney stones    History of MRSA infection    left knee after arthroplasty   Hyperlipidemia    Hypertension    states under control with med., has been on med. x 1 yr.   Impaired hearing    Left bundle branch block (LBBB) on electrocardiogram 10/29/2015   Macular degeneration (senile) of retina    left   Pre-diabetes     Past Surgical History:  Procedure Laterality Date   ANKLE FUSION Bilateral    ANKLE SURGERY Bilateral    ligament surgery   BIOPSY  04/29/2020   Procedure: BIOPSY;  Surgeon: Shellia Cleverly, DO;  [facility];  Service: Gastroenterology;;   BIOPSY  07/14/2022   Procedure: BIOPSY;  Surgeon: Shellia Cleverly, DO;  [facility];  Service: Gastroenterology;;   CARDIAC CATHETERIZATION  x 2   1983; 11/14/2003   CARPOMETACARPEL  SUSPENSION PLASTY Right 09/22/2016   Procedure: right thumb carpometacarpal  ARTHROPLASTY;  Surgeon: Betha Loa, MD;  Location: [facility];  Service: Orthopedics;  Laterality: Right;  right thumb carpometacarpal  ARTHROPLASTY   CARPOMETACARPEL SUSPENSION PLASTY Right 11/02/2017   Procedure: RIGHT THUMB SUSPENSIONPLASTY WITH TIGHTROPE, DISTAL POLE SCAPHOID  EXCISION;  Surgeon: Betha Loa, MD;  Location: [facility];  Service: Orthopedics;  Laterality: Right;   COLONOSCOPY WITH PROPOFOL N/A 10/02/2019   Procedure: COLONOSCOPY WITH PROPOFOL;  Surgeon: Shellia Cleverly, DO;  [facility];  Service: Gastroenterology;  Laterality: N/A;   COLONOSCOPY WITH PROPOFOL N/A 10/09/2019   Procedure: COLONOSCOPY WITH PROPOFOL;  Surgeon: Iva Boop, MD;  [facility];  Service: Endoscopy;  Laterality: N/A;   COLONOSCOPY WITH PROPOFOL N/A 04/29/2020   Procedure: COLONOSCOPY WITH PROPOFOL;  Surgeon: Shellia Cleverly, DO;  [facility];  Service: Gastroenterology;  Laterality: N/A;  COLONOSCOPY WITH PROPOFOL N/A 11/11/2021   Procedure: COLONOSCOPY WITH PROPOFOL;  Surgeon: Shellia Cleverly, DO;  [facility];  Service: Gastroenterology;  Laterality: N/A;   ELBOW SURGERY Left    ENDOSCOPIC MUCOSAL RESECTION N/A 10/02/2019   Procedure: ENDOSCOPIC MUCOSAL RESECTION;  Surgeon: Shellia Cleverly, DO;  [facility];  Service: Gastroenterology;  Laterality: N/A;   ESOPHAGOGASTRODUODENOSCOPY (EGD) WITH PROPOFOL N/A 07/14/2022   Procedure: ESOPHAGOGASTRODUODENOSCOPY (EGD) WITH PROPOFOL;  Surgeon: Shellia Cleverly, DO;  [facility];  Service: Gastroenterology;  Laterality: N/A;   HEMOSTASIS CLIP PLACEMENT  10/09/2019   Procedure: HEMOSTASIS CLIP PLACEMENT;  Surgeon: Iva Boop, MD;  [facility];  Service: Endoscopy;;   HEMOSTASIS CLIP PLACEMENT  11/11/2021   Procedure: HEMOSTASIS CLIP PLACEMENT;  Surgeon:  Shellia Cleverly, DO;  [facility];  Service: Gastroenterology;;   HEMOSTASIS CLIP PLACEMENT  07/14/2022   Procedure: HEMOSTASIS CLIP PLACEMENT;  Surgeon: Shellia Cleverly, DO;  [facility];  Service: Gastroenterology;;   INGUINAL HERNIA REPAIR Right    KNEE SURGERY     OPEN REDUCTION INTERNAL FIXATION (ORIF) DISTAL RADIAL FRACTURE Right 10/29/2015   Procedure: OPEN REDUCTION INTERNAL FIXATION (ORIF) DISTAL RADIAL FRACTURE;  Surgeon: Betha Loa, MD;  Location: [facility];  Service: Orthopedics;  Laterality: Right;   ORIF ELBOW FRACTURE Right    POLYPECTOMY  04/29/2020   Procedure: POLYPECTOMY;  Surgeon: Shellia Cleverly, DO;  [facility];  Service: Gastroenterology;;   POLYPECTOMY  11/11/2021   Procedure: POLYPECTOMY;  Surgeon: Shellia Cleverly, DO;  [facility];  Service: Gastroenterology;;   POLYPECTOMY  07/14/2022   Procedure: POLYPECTOMY;  Surgeon: Shellia Cleverly, DO;  [facility];  Service: Gastroenterology;;   Gaspar Bidding DILATION N/A 07/14/2022   Procedure: SAVORY DILATION;  Surgeon: Shellia Cleverly, DO;  [facility];  Service: Gastroenterology;  Laterality: N/A;   SUBMUCOSAL LIFTING INJECTION  10/02/2019   Procedure: SUBMUCOSAL LIFTING INJECTION;  Surgeon: Shellia Cleverly, DO;  [facility];  Service: Gastroenterology;;   TONSILLECTOMY     TOTAL KNEE ARTHROPLASTY Bilateral    TOTAL KNEE REVISION Right 08/25/2021   Procedure: Right knee polyethylene vs total knee arthroplasty revision;  Surgeon: Ollen Gross, MD;  [facility];  Service: Orthopedics;  Laterality: Right;   ULNAR COLLATERAL LIGAMENT REPAIR Right 12/29/2015   Procedure: REPAIR  RIGHT LATERAL ULNAR COLLATERAL LIGAMENT TEAR  EXTENSOR ORIGIN ;  Surgeon: Betha Loa, MD;  Location: [facility];  Service: Orthopedics;  Laterality: Right;   WRIST ARTHROSCOPY WITH DEBRIDEMENT Right 07/14/2016   Procedure:  RIGHT WRIST ARTHROSCOPY WITH DEBRIDEMENT  TRIANGULAR FIBROCARTILAGE COMPLEX;  Surgeon: Betha Loa, MD;  Location: [facility];  Service: Orthopedics;  Laterality: Right;    MEDICATIONS:  albuterol (VENTOLIN HFA) 108 (90 Base) MCG/ACT inhaler   ARIPiprazole ER (ABILIFY MAINTENA) 400 MG PRSY prefilled syringe   azelastine (ASTELIN) 0.1 % nasal spray   cetirizine (ZYRTEC) 10 MG tablet   citalopram (CELEXA) 20 MG tablet   Continuous Blood Gluc Receiver (FREESTYLE LIBRE 2 READER) DEVI   Continuous Blood Gluc Sensor (FREESTYLE LIBRE 2 SENSOR) MISC   fenofibrate (TRICOR) 145 MG tablet   ipratropium-albuterol (DUONEB) 0.5-2.5 (3) MG/3ML SOLN   losartan (COZAAR) 50 MG tablet   metFORMIN (GLUCOPHAGE) 500 MG tablet   Multiple Vitamin (MULTIVITAMIN WITH MINERALS) TABS tablet   Multiple Vitamins-Minerals (PRESERVISION AREDS 2 PO)   pantoprazole (PROTONIX) 40 MG tablet   temazepam (RESTORIL) 15  MG capsule   VASCEPA 1 g capsule    ARIPiprazole ER (ABILIFY MAINTENA) 400 MG prefilled syringe 400 mg   Marcille Blanco [facility] Stay/Anesthesiology [facility] Phone [phone] 11/06/2023 1:26 PM

## 2023-11-03 ENCOUNTER — Encounter: Payer: Self-pay | Admitting: Adult Health

## 2023-11-03 ENCOUNTER — Ambulatory Visit: Payer: PPO

## 2023-11-03 ENCOUNTER — Ambulatory Visit: Payer: PPO | Admitting: Adult Health

## 2023-11-03 VITALS — BP 120/60 | HR 72 | Temp 98.5°F | Ht 70.0 in | Wt 266.0 lb

## 2023-11-03 DIAGNOSIS — R300 Dysuria: Secondary | ICD-10-CM

## 2023-11-03 DIAGNOSIS — N200 Calculus of kidney: Secondary | ICD-10-CM | POA: Diagnosis not present

## 2023-11-03 LAB — POCT URINALYSIS DIPSTICK
Bilirubin, UA: NEGATIVE
Blood, UA: NEGATIVE
Glucose, UA: NEGATIVE
Ketones, UA: NEGATIVE
Nitrite, UA: NEGATIVE
Protein, UA: POSITIVE — AB
Spec Grav, UA: 1.02 (ref 1.010–1.025)
Urobilinogen, UA: 0.2 U/dL
pH, UA: 6 (ref 5.0–8.0)

## 2023-11-03 MED ORDER — CIPROFLOXACIN HCL 500 MG PO TABS: 500.0000 mg | ORAL_TABLET | Freq: Two times a day (BID) | ORAL | 0 refills | Status: AC

## 2023-11-03 NOTE — Progress Notes (Signed)
Subjective:    Patient ID: Sean Hill, male    DOB: Nov 26, 1940, 83 y.o.   MRN: [id number]  HPI  83 year old male who  has a past medical history of Anal fissure, Anxiety, Arthritis, Asthma, Bipolar disorder (HCC), Cataract, Depression, Difficult airway for intubation, GERD (gastroesophageal reflux disease), Hemorrhage of colon following colonoscopy (10/08/2019), History of kidney stones, History of MRSA infection, Hyperlipidemia, Hypertension, Impaired hearing, Left bundle branch block (LBBB) on electrocardiogram (10/29/2015), Macular degeneration (senile) of retina, and Pre-diabetes.  He presents to the office today for concern of a UTI. He had an E - visit yesterday and was advised to follow up in person. His symptoms have been present for 7 days. . Symptoms include dysuria, urinary frequency, urgency, cloudy urine.   He does have a history of kidney stones many years ago.   Review of Systems See HPI   Past Medical History:  Diagnosis Date   Anal fissure    Anxiety    Arthritis    right wrist; pt. states "everywhere"   Asthma    Bipolar disorder (HCC)    Cataract    Depression    Difficult airway for intubation    GERD (gastroesophageal reflux disease)    Hemorrhage of colon following colonoscopy 10/08/2019   History of kidney stones    History of MRSA infection    left knee after arthroplasty   Hyperlipidemia    Hypertension    states under control with med., has been on med. x 1 yr.   Impaired hearing    Left bundle branch block (LBBB) on electrocardiogram 10/29/2015   Macular degeneration (senile) of retina    left   Pre-diabetes     Social History   Socioeconomic History   Marital status: Married    Spouse name: Not on file   Number of children: Not on file   Years of education: Not on file   Highest education level: Associate degree: occupational, Scientist, product/process development, or vocational program  Occupational History   Not on file  Tobacco Use   Smoking status:  Never   Smokeless tobacco: Never  Vaping Use   Vaping status: Never Used  Substance and Sexual Activity   Alcohol use: Not Currently    Comment: occasional.   Drug use: No   Sexual activity: Not Currently    Birth control/protection: None  Other Topics Concern   Not on file  Social History Narrative   Married, lives in [place].  Retired Quarry manager.   Two daughters, both married,3 grandchildren    No regular exercise.  Never smoker.  No ETOH.  No drugs.   Social Drivers of Corporate investment banker Strain: Low Risk  (11/02/2023)   Overall Financial Resource Strain (CARDIA)    Difficulty of Paying Living Expenses: Not hard at all  Food Insecurity: No Food Insecurity (11/02/2023)   Hunger Vital Sign    Worried About Running Out of Food in the Last Year: Never true    Ran Out of Food in the Last Year: Never true  Transportation Needs: No Transportation Needs (11/02/2023)   PRAPARE - Administrator, Civil Service (Medical): No    Lack of Transportation (Non-Medical): No  Physical Activity: Insufficiently Active (11/02/2023)   Exercise Vital Sign    Days of Exercise per Week: 1 day    Minutes of Exercise per Session: 10 min  Stress: No Stress Concern Present (11/02/2023)   [employer] of Occupational Health - Occupational  Stress Questionnaire    Feeling of Stress : Not at all  Social Connections: Moderately Isolated (11/02/2023)   Social Connection and Isolation Panel [NHANES]    Frequency of Communication with Friends and Family: Twice a week    Frequency of Social Gatherings with Friends and Family: Once a week    Attends Religious Services: Never    Database administrator or Organizations: No    Attends Engineer, structural: Not on file    Marital Status: Married  Catering manager Violence: Not At Risk (09/26/2022)   Humiliation, Afraid, Rape, and Kick questionnaire    Fear of Current or Ex-Partner: No    Emotionally Abused: No    Physically  Abused: No    Sexually Abused: No    Past Surgical History:  Procedure Laterality Date   ANKLE FUSION Bilateral    ANKLE SURGERY Bilateral    ligament surgery   BIOPSY  04/29/2020   Procedure: BIOPSY;  Surgeon: Shellia Cleverly, DO;  Location: [facility];  Service: Gastroenterology;;   BIOPSY  07/14/2022   Procedure: BIOPSY;  Surgeon: Shellia Cleverly, DO;  Location: [facility];  Service: Gastroenterology;;   CARDIAC CATHETERIZATION  x 2   1983; 11/14/2003   CARPOMETACARPEL SUSPENSION PLASTY Right 09/22/2016   Procedure: right thumb carpometacarpal  ARTHROPLASTY;  Surgeon: Betha Loa, MD;  Location: [facility];  Service: Orthopedics;  Laterality: Right;  right thumb carpometacarpal  ARTHROPLASTY   CARPOMETACARPEL SUSPENSION PLASTY Right 11/02/2017   Procedure: RIGHT THUMB SUSPENSIONPLASTY WITH TIGHTROPE, DISTAL POLE SCAPHOID  EXCISION;  Surgeon: Betha Loa, MD;  Location: [facility];  Service: Orthopedics;  Laterality: Right;   COLONOSCOPY WITH PROPOFOL N/A 10/02/2019   Procedure: COLONOSCOPY WITH PROPOFOL;  Surgeon: Shellia Cleverly, DO;  Location: [facility];  Service: Gastroenterology;  Laterality: N/A;   COLONOSCOPY WITH PROPOFOL N/A 10/09/2019   Procedure: COLONOSCOPY WITH PROPOFOL;  Surgeon: Iva Boop, MD;  Location: [facility];  Service: Endoscopy;  Laterality: N/A;   COLONOSCOPY WITH PROPOFOL N/A 04/29/2020   Procedure: COLONOSCOPY WITH PROPOFOL;  Surgeon: Shellia Cleverly, DO;  Location: [facility];  Service: Gastroenterology;  Laterality: N/A;   COLONOSCOPY WITH PROPOFOL N/A 11/11/2021   Procedure: COLONOSCOPY WITH PROPOFOL;  Surgeon: Shellia Cleverly, DO;  Location: [facility];  Service: Gastroenterology;  Laterality: N/A;   ELBOW SURGERY Left    ENDOSCOPIC MUCOSAL RESECTION N/A 10/02/2019   Procedure: ENDOSCOPIC MUCOSAL RESECTION;  Surgeon: Shellia Cleverly, DO;  Location: [facility];  Service: Gastroenterology;   Laterality: N/A;   ESOPHAGOGASTRODUODENOSCOPY (EGD) WITH PROPOFOL N/A 07/14/2022   Procedure: ESOPHAGOGASTRODUODENOSCOPY (EGD) WITH PROPOFOL;  Surgeon: Shellia Cleverly, DO;  Location: [facility];  Service: Gastroenterology;  Laterality: N/A;   HEMOSTASIS CLIP PLACEMENT  10/09/2019   Procedure: HEMOSTASIS CLIP PLACEMENT;  Surgeon: Iva Boop, MD;  Location: [facility];  Service: Endoscopy;;   HEMOSTASIS CLIP PLACEMENT  11/11/2021   Procedure: HEMOSTASIS CLIP PLACEMENT;  Surgeon: Shellia Cleverly, DO;  Location: [facility];  Service: Gastroenterology;;   HEMOSTASIS CLIP PLACEMENT  07/14/2022   Procedure: HEMOSTASIS CLIP PLACEMENT;  Surgeon: Shellia Cleverly, DO;  Location: [facility];  Service: Gastroenterology;;   INGUINAL HERNIA REPAIR Right    KNEE SURGERY     OPEN REDUCTION INTERNAL FIXATION (ORIF) DISTAL RADIAL FRACTURE Right 10/29/2015   Procedure: OPEN REDUCTION INTERNAL FIXATION (ORIF) DISTAL RADIAL FRACTURE;  Surgeon: Betha Loa, MD;  Location: [facility];  Service: Orthopedics;  Laterality:  Right;   ORIF ELBOW FRACTURE Right    POLYPECTOMY  04/29/2020   Procedure: POLYPECTOMY;  Surgeon: Shellia Cleverly, DO;  Location: [facility];  Service: Gastroenterology;;   POLYPECTOMY  11/11/2021   Procedure: POLYPECTOMY;  Surgeon: Shellia Cleverly, DO;  Location: [facility];  Service: Gastroenterology;;   POLYPECTOMY  07/14/2022   Procedure: POLYPECTOMY;  Surgeon: Shellia Cleverly, DO;  Location: [facility];  Service: Gastroenterology;;   Gaspar Bidding DILATION N/A 07/14/2022   Procedure: SAVORY DILATION;  Surgeon: Shellia Cleverly, DO;  Location: [facility];  Service: Gastroenterology;  Laterality: N/A;   SUBMUCOSAL LIFTING INJECTION  10/02/2019   Procedure: SUBMUCOSAL LIFTING INJECTION;  Surgeon: Shellia Cleverly, DO;  Location: [facility];  Service: Gastroenterology;;   TONSILLECTOMY     TOTAL KNEE ARTHROPLASTY Bilateral    TOTAL KNEE REVISION  Right 08/25/2021   Procedure: Right knee polyethylene vs total knee arthroplasty revision;  Surgeon: Ollen Gross, MD;  Location: [facility];  Service: Orthopedics;  Laterality: Right;   ULNAR COLLATERAL LIGAMENT REPAIR Right 12/29/2015   Procedure: REPAIR  RIGHT LATERAL ULNAR COLLATERAL LIGAMENT TEAR  EXTENSOR ORIGIN ;  Surgeon: Betha Loa, MD;  Location: [facility];  Service: Orthopedics;  Laterality: Right;   WRIST ARTHROSCOPY WITH DEBRIDEMENT Right 07/14/2016   Procedure: RIGHT WRIST ARTHROSCOPY WITH DEBRIDEMENT  TRIANGULAR FIBROCARTILAGE COMPLEX;  Surgeon: Betha Loa, MD;  Location: [facility];  Service: Orthopedics;  Laterality: Right;    Family History  Problem Relation Age of Onset   Alcohol abuse Mother    Ovarian cancer Mother    Alcohol abuse Father    Pancreatic cancer Father    Hyperlipidemia Brother    Barrett's esophagus Brother    Colon cancer Maternal Grandmother    Depression Daughter    Paranoid behavior Daughter    Esophageal cancer Neg Hx    Stomach cancer Neg Hx    Rectal cancer Neg Hx    Colon polyps Neg Hx     Allergies  Allergen Reactions   Penicillins Other (See Comments)    BLISTERS Did it involve swelling of the face/tongue/throat, SOB, or low BP? No Did it involve sudden or severe rash/hives, skin peeling, or any reaction on the inside of your mouth or nose? No Did you need to seek medical attention at a hospital or doctor's office? No When did it last happen? around 1980    If all above answers are "NO", may proceed with cephalosporin use. Tolerated Cephalosporin Date: 08/26/21.     Lisinopril Diarrhea and Cough    Dry cough, abdominal bloating and diarrhea   Zetia [Ezetimibe]     Myalgia    Codeine Rash    Current Outpatient Medications on File Prior to Visit  Medication Sig Dispense Refill   albuterol (VENTOLIN HFA) 108 (90 Base) MCG/ACT inhaler Inhale 2 puffs into the lungs every 6 (six) hours as needed for  wheezing or shortness of breath. 6.7 g 3   ARIPiprazole ER (ABILIFY MAINTENA) 400 MG PRSY prefilled syringe Inject 400 mg into the muscle every 28 (twenty-eight) days.     azelastine (ASTELIN) 0.1 % nasal spray Place 2 sprays into both nostrils 2 (two) times daily. Use in each nostril as directed 30 mL 6   cetirizine (ZYRTEC) 10 MG tablet TAKE 1 TABLET (10 MG TOTAL) BY MOUTH EVERY MORNING. 90 tablet 0   citalopram (CELEXA) 20 MG tablet Take 1 tablet (20 mg total) by mouth daily. 90 tablet 0  fenofibrate (TRICOR) 145 MG tablet TAKE 1 TABLET BY MOUTH EVERY DAY 90 tablet 0   ipratropium-albuterol (DUONEB) 0.5-2.5 (3) MG/3ML SOLN INHALE 3 ML BY NEBULIZER EVERY 6 HOURS AS NEEDED 360 mL 0   losartan (COZAAR) 50 MG tablet TAKE 1 TABLET BY MOUTH EVERY MORNING 90 tablet 0   metFORMIN (GLUCOPHAGE) 500 MG tablet TAKE 1 TABLET (500 MG TOTAL) BY MOUTH DAILY. 90 tablet 0   Multiple Vitamin (MULTIVITAMIN WITH MINERALS) TABS tablet Take 1 tablet by mouth daily.     Multiple Vitamins-Minerals (PRESERVISION AREDS 2 PO) Take 1 capsule by mouth 2 (two) times daily.     pantoprazole (PROTONIX) 40 MG tablet TAKE 1 TABLET BY MOUTH 2 (TWO) TIMES DAILY. PLEASE KEEP APPOINTMENT SCHEDULED FOR 12-04-23 AT 840AM 180 tablet 0   temazepam (RESTORIL) 15 MG capsule Take 1 capsule (15 mg total) by mouth at bedtime. 90 capsule 0   VASCEPA 1 g capsule TAKE TWO CAPSULES BY MOUTH TWICE DAILY 360 capsule 3   Current Facility-Administered Medications on File Prior to Visit  Medication Dose Route Frequency Provider Last Rate Last Admin   ARIPiprazole ER (ABILIFY MAINTENA) 400 MG prefilled syringe 400 mg  400 mg Intramuscular Q28 days Arfeen, Phillips Grout, MD   400 mg at 10/19/23 1107    BP 120/60   Pulse 72   Temp 98.5 F (36.9 C) (Oral)   Ht 5\' 10"  (1.778 m)   Wt 266 lb (120.7 kg)   SpO2 96%   BMI 38.17 kg/m       Objective:   Physical Exam Vitals and nursing note reviewed.  Constitutional:      Appearance: Normal appearance.  He is obese.  Cardiovascular:     Rate and Rhythm: Regular rhythm.     Pulses: Normal pulses.     Heart sounds: Normal heart sounds.  Pulmonary:     Effort: Pulmonary effort is normal.     Breath sounds: Normal breath sounds.  Musculoskeletal:        General: Normal range of motion.  Skin:    General: Skin is warm and dry.  Neurological:     General: No focal deficit present.     Mental Status: He is oriented to person, place, and time.  Psychiatric:        Mood and Affect: Mood normal.        Behavior: Behavior normal.        Thought Content: Thought content normal.        Judgment: Judgment normal.       Assessment & Plan:   1. Dysuria (Primary)  - POC Urinalysis Dipstick + leuks and protein.  - Will start on with Cipro since he is having shoulder replacement surgery next week, send culture and do KUB to look for kidney stone.  - Culture, Urine; Future - DG Abd 1 View; Future - ciprofloxacin (CIPRO) 500 MG tablet; Take 1 tablet (500 mg total) by mouth 2 (two) times daily for 7 days.  Dispense: 14 tablet; Refill: 0   Shirline Frees, NP  Time spent with patient today was 31  minutes which consisted of chart review, discussing UTI and kidney stones, work up, treatment answering questions and documentation.

## 2023-11-05 DIAGNOSIS — M5416 Radiculopathy, lumbar region: Secondary | ICD-10-CM | POA: Diagnosis not present

## 2023-11-05 LAB — URINE CULTURE
MICRO NUMBER:: 16086574
Result:: NO GROWTH
SPECIMEN QUALITY:: ADEQUATE

## 2023-11-07 DIAGNOSIS — H353232 Exudative age-related macular degeneration, bilateral, with inactive choroidal neovascularization: Secondary | ICD-10-CM | POA: Diagnosis not present

## 2023-11-07 DIAGNOSIS — H43813 Vitreous degeneration, bilateral: Secondary | ICD-10-CM | POA: Diagnosis not present

## 2023-11-10 ENCOUNTER — Ambulatory Visit (HOSPITAL_COMMUNITY): Payer: PPO | Admitting: Medical

## 2023-11-10 ENCOUNTER — Encounter (HOSPITAL_COMMUNITY): Payer: Self-pay | Admitting: Orthopedic Surgery

## 2023-11-10 ENCOUNTER — Ambulatory Visit (HOSPITAL_COMMUNITY): Payer: Self-pay | Admitting: Medical

## 2023-11-10 ENCOUNTER — Other Ambulatory Visit: Payer: Self-pay

## 2023-11-10 ENCOUNTER — Ambulatory Visit (HOSPITAL_COMMUNITY)
Admission: RE | Admit: 2023-11-10 | Discharge: 2023-11-10 | Disposition: A | Discharge status: Home / Self Care | Payer: PPO | Source: Ambulatory Visit | Attending: Orthopedic Surgery | Admitting: Orthopedic Surgery

## 2023-11-10 ENCOUNTER — Encounter (HOSPITAL_COMMUNITY)
Admission: RE | Disposition: A | Discharge status: Home / Self Care | Payer: Self-pay | Source: Ambulatory Visit | Attending: Orthopedic Surgery

## 2023-11-10 ENCOUNTER — Ambulatory Visit (HOSPITAL_COMMUNITY): Payer: PPO

## 2023-11-10 DIAGNOSIS — Z96612 Presence of left artificial shoulder joint: Secondary | ICD-10-CM | POA: Diagnosis not present

## 2023-11-10 DIAGNOSIS — M19012 Primary osteoarthritis, left shoulder: Secondary | ICD-10-CM | POA: Diagnosis not present

## 2023-11-10 DIAGNOSIS — I10 Essential (primary) hypertension: Secondary | ICD-10-CM | POA: Insufficient documentation

## 2023-11-10 DIAGNOSIS — E119 Type 2 diabetes mellitus without complications: Secondary | ICD-10-CM | POA: Insufficient documentation

## 2023-11-10 DIAGNOSIS — M75102 Unspecified rotator cuff tear or rupture of left shoulder, not specified as traumatic: Secondary | ICD-10-CM | POA: Diagnosis not present

## 2023-11-10 DIAGNOSIS — J45909 Unspecified asthma, uncomplicated: Secondary | ICD-10-CM

## 2023-11-10 DIAGNOSIS — Z7984 Long term (current) use of oral hypoglycemic drugs: Secondary | ICD-10-CM | POA: Diagnosis not present

## 2023-11-10 DIAGNOSIS — E669 Obesity, unspecified: Secondary | ICD-10-CM | POA: Insufficient documentation

## 2023-11-10 DIAGNOSIS — F319 Bipolar disorder, unspecified: Secondary | ICD-10-CM | POA: Insufficient documentation

## 2023-11-10 DIAGNOSIS — M12812 Other specific arthropathies, not elsewhere classified, left shoulder: Secondary | ICD-10-CM | POA: Diagnosis not present

## 2023-11-10 DIAGNOSIS — Z6838 Body mass index (BMI) 38.0-38.9, adult: Secondary | ICD-10-CM | POA: Insufficient documentation

## 2023-11-10 DIAGNOSIS — Z79899 Other long term (current) drug therapy: Secondary | ICD-10-CM | POA: Diagnosis not present

## 2023-11-10 DIAGNOSIS — G8918 Other acute postprocedural pain: Secondary | ICD-10-CM | POA: Diagnosis not present

## 2023-11-10 DIAGNOSIS — K219 Gastro-esophageal reflux disease without esophagitis: Secondary | ICD-10-CM | POA: Diagnosis not present

## 2023-11-10 HISTORY — PX: REVERSE SHOULDER ARTHROPLASTY: SHX5054

## 2023-11-10 LAB — GLUCOSE, CAPILLARY: Glucose-Capillary: 105 mg/dL — ABNORMAL HIGH (ref 70–99)

## 2023-11-10 SURGERY — ARTHROPLASTY, SHOULDER, TOTAL, REVERSE
Anesthesia: Regional | Site: Shoulder | Laterality: Left

## 2023-11-10 MED ORDER — PHENYLEPHRINE HCL-NACL 20-0.9 MG/250ML-% IV SOLN
INTRAVENOUS | Status: DC | PRN
Start: 2023-11-10 — End: 2023-11-10
  Administered 2023-11-10: 15 ug/min via INTRAVENOUS

## 2023-11-10 MED ORDER — ROCURONIUM BROMIDE 10 MG/ML (PF) SYRINGE
PREFILLED_SYRINGE | INTRAVENOUS | Status: DC | PRN
  Administered 2023-11-10: 50 mg via INTRAVENOUS

## 2023-11-10 MED ORDER — 0.9 % SODIUM CHLORIDE (POUR BTL) OPTIME
TOPICAL | Status: DC | PRN
  Administered 2023-11-10: 1000 mL

## 2023-11-10 MED ORDER — FENTANYL CITRATE PF 50 MCG/ML IJ SOSY
25.0000 ug | PREFILLED_SYRINGE | INTRAMUSCULAR | Status: DC | PRN
Start: 2023-11-10 — End: 2023-11-10

## 2023-11-10 MED ORDER — METHOCARBAMOL 500 MG PO TABS: 500.0000 mg | ORAL_TABLET | Freq: Three times a day (TID) | ORAL | 1 refills | Status: DC | PRN

## 2023-11-10 MED ORDER — ONDANSETRON HCL 4 MG/2ML IJ SOLN: 4.0000 mg | Freq: Once | INTRAMUSCULAR | Status: DC | PRN

## 2023-11-10 MED ORDER — DEXAMETHASONE SODIUM PHOSPHATE 10 MG/ML IJ SOLN
INTRAMUSCULAR | Status: DC | PRN
  Administered 2023-11-10: 8 mg via INTRAVENOUS

## 2023-11-10 MED ORDER — OXYCODONE HCL 5 MG PO TABS: 5.0000 mg | ORAL_TABLET | Freq: Once | ORAL | Status: DC | PRN

## 2023-11-10 MED ORDER — LIDOCAINE 2% (20 MG/ML) 5 ML SYRINGE
INTRAMUSCULAR | Status: DC | PRN
  Administered 2023-11-10: 40 mg via INTRAVENOUS

## 2023-11-10 MED ORDER — BUPIVACAINE LIPOSOME 1.3 % IJ SUSP
INTRAMUSCULAR | Status: DC | PRN
  Administered 2023-11-10: 10 mL via PERINEURAL

## 2023-11-10 MED ORDER — FENTANYL CITRATE PF 50 MCG/ML IJ SOSY
50.0000 ug | PREFILLED_SYRINGE | INTRAMUSCULAR | Status: DC
  Administered 2023-11-10: 50 ug via INTRAVENOUS
  Filled 2023-11-10: qty 2

## 2023-11-10 MED ORDER — ONDANSETRON HCL 4 MG/2ML IJ SOLN
INTRAMUSCULAR | Status: DC | PRN
  Administered 2023-11-10: 4 mg via INTRAVENOUS

## 2023-11-10 MED ORDER — BUPIVACAINE-EPINEPHRINE (PF) 0.25% -1:200000 IJ SOLN
INTRAMUSCULAR | Status: DC | PRN
  Administered 2023-11-10: 20 mL

## 2023-11-10 MED ORDER — ACETAMINOPHEN 325 MG PO TABS: 325.0000 mg | ORAL_TABLET | ORAL | Status: DC | PRN

## 2023-11-10 MED ORDER — SUGAMMADEX SODIUM 200 MG/2ML IV SOLN
INTRAVENOUS | Status: DC | PRN
  Administered 2023-11-10: 400 mg via INTRAVENOUS

## 2023-11-10 MED ORDER — STERILE WATER FOR IRRIGATION IR SOLN
Status: DC | PRN
  Administered 2023-11-10: 1000 mL

## 2023-11-10 MED ORDER — TRANEXAMIC ACID-NACL 1000-0.7 MG/100ML-% IV SOLN
1000.0000 mg | INTRAVENOUS | Status: AC
  Administered 2023-11-10: 1000 mg via INTRAVENOUS
  Filled 2023-11-10: qty 100

## 2023-11-10 MED ORDER — HYDROCODONE-ACETAMINOPHEN 7.5-325 MG/15ML PO SOLN: 15.0000 mL | ORAL | 0 refills | Status: DC | PRN

## 2023-11-10 MED ORDER — CHLORHEXIDINE GLUCONATE 0.12 % MT SOLN
15.0000 mL | Freq: Once | OROMUCOSAL | Status: AC
  Administered 2023-11-10: 15 mL via OROMUCOSAL

## 2023-11-10 MED ORDER — SUCCINYLCHOLINE CHLORIDE 200 MG/10ML IV SOSY
PREFILLED_SYRINGE | INTRAVENOUS | Status: DC | PRN
Start: 2023-11-10 — End: 2023-11-10
  Administered 2023-11-10: 120 mg via INTRAVENOUS

## 2023-11-10 MED ORDER — CEFAZOLIN SODIUM-DEXTROSE 3-4 GM/150ML-% IV SOLN
3.0000 g | INTRAVENOUS | Status: AC
  Administered 2023-11-10: 3 g via INTRAVENOUS
  Filled 2023-11-10: qty 150

## 2023-11-10 MED ORDER — BUPIVACAINE-EPINEPHRINE 0.25% -1:200000 IJ SOLN
INTRAMUSCULAR | Status: AC
  Filled 2023-11-10: qty 1

## 2023-11-10 MED ORDER — ORAL CARE MOUTH RINSE: 15.0000 mL | Freq: Once | OROMUCOSAL | Status: AC

## 2023-11-10 MED ORDER — PROPOFOL 10 MG/ML IV BOLUS
INTRAVENOUS | Status: DC | PRN
  Administered 2023-11-10: 50 mg via INTRAVENOUS
  Administered 2023-11-10: 150 mg via INTRAVENOUS

## 2023-11-10 MED ORDER — MEPERIDINE HCL 50 MG/ML IJ SOLN: 6.2500 mg | INTRAMUSCULAR | Status: DC | PRN

## 2023-11-10 MED ORDER — BUPIVACAINE HCL (PF) 0.5 % IJ SOLN
INTRAMUSCULAR | Status: DC | PRN
  Administered 2023-11-10: 10 mL via PERINEURAL

## 2023-11-10 MED ORDER — MUPIROCIN 2 % EX OINT: 1.0000 | TOPICAL_OINTMENT | Freq: Two times a day (BID) | CUTANEOUS | 0 refills | Status: AC

## 2023-11-10 MED ORDER — INSULIN ASPART 100 UNIT/ML IJ SOLN
0.0000 [IU] | INTRAMUSCULAR | Status: DC | PRN
Start: 2023-11-10 — End: 2023-11-10

## 2023-11-10 MED ORDER — CHLORHEXIDINE GLUCONATE 4 % EX SOLN: 1.0000 | CUTANEOUS | 1 refills | Status: DC

## 2023-11-10 MED ORDER — OXYCODONE HCL 5 MG/5ML PO SOLN: 5.0000 mg | Freq: Once | ORAL | Status: DC | PRN

## 2023-11-10 MED ORDER — LACTATED RINGERS IV SOLN: INTRAVENOUS | Status: DC

## 2023-11-10 MED ORDER — ACETAMINOPHEN 160 MG/5ML PO SOLN: 325.0000 mg | ORAL | Status: DC | PRN

## 2023-11-10 SURGICAL SUPPLY — 63 items
BAG COUNTER SPONGE SURGICOUNT (BAG) IMPLANT
BAG ZIPLOCK 12X15 (MISCELLANEOUS) IMPLANT
BIT DRILL 1.6MX128 (BIT) IMPLANT
BIT DRILL 170X2.5X (BIT) IMPLANT
BIT DRL 170X2.5X (BIT) ×1
BLADE SAG 18X100X1.27 (BLADE) ×1 IMPLANT
COVER BACK TABLE 60X90IN (DRAPES) ×1 IMPLANT
COVER SURGICAL LIGHT HANDLE (MISCELLANEOUS) ×1 IMPLANT
CUP HUMERAL 42 PLUS 3 (Orthopedic Implant) IMPLANT
DRAPE INCISE IOBAN 66X45 STRL (DRAPES) ×1 IMPLANT
DRAPE SHEET LG 3/4 BI-LAMINATE (DRAPES) ×1 IMPLANT
DRAPE SURG ORHT 6 SPLT 77X108 (DRAPES) ×2 IMPLANT
DRAPE TOP 10253 STERILE (DRAPES) ×1 IMPLANT
DRAPE U-SHAPE 47X51 STRL (DRAPES) ×1 IMPLANT
DRSG ADAPTIC 3X8 NADH LF (GAUZE/BANDAGES/DRESSINGS) ×1 IMPLANT
DRSG AQUACEL AG ADV 3.5X10 (GAUZE/BANDAGES/DRESSINGS) IMPLANT
DURAPREP 26ML APPLICATOR (WOUND CARE) ×1 IMPLANT
ELECT BLADE TIP CTD 4 INCH (ELECTRODE) ×1 IMPLANT
ELECT NDL TIP 2.8 STRL (NEEDLE) ×1 IMPLANT
ELECT NEEDLE TIP 2.8 STRL (NEEDLE) ×1
ELECT REM PT RETURN 15FT ADLT (MISCELLANEOUS) ×1 IMPLANT
EPIPHSYSI CENTER SZ 2 LT (Shoulder) ×1 IMPLANT
EPIPHYSIS CENTER SZ 2 LT (Shoulder) IMPLANT
FACESHIELD WRAPAROUND (MASK) ×1
FACESHIELD WRAPAROUND OR TEAM (MASK) ×1 IMPLANT
GAUZE PAD ABD 8X10 STRL (GAUZE/BANDAGES/DRESSINGS) ×1 IMPLANT
GAUZE SPONGE 4X4 12PLY STRL (GAUZE/BANDAGES/DRESSINGS) ×1 IMPLANT
GLENOSPHERE XTEND LAT 42+0 STD (Miscellaneous) IMPLANT
GLOVE BIOGEL PI IND STRL 7.5 (GLOVE) ×1 IMPLANT
GLOVE BIOGEL PI IND STRL 8.5 (GLOVE) ×1 IMPLANT
GLOVE ORTHO TXT STRL SZ7.5 (GLOVE) ×1 IMPLANT
GLOVE SURG ORTHO 8.5 STRL (GLOVE) ×1 IMPLANT
GOWN STRL REUS W/ TWL XL LVL3 (GOWN DISPOSABLE) ×2 IMPLANT
KIT BASIN OR (CUSTOM PROCEDURE TRAY) ×1 IMPLANT
KIT TURNOVER KIT A (KITS) IMPLANT
MANIFOLD NEPTUNE II (INSTRUMENTS) ×1 IMPLANT
METAGLENE DELTA EXTEND (Trauma) IMPLANT
METAGLENE DXTEND (Trauma) ×1 IMPLANT
NDL MAYO CATGUT SZ4 TPR NDL (NEEDLE) IMPLANT
NEEDLE MAYO CATGUT SZ4 (NEEDLE)
NS IRRIG 1000ML POUR BTL (IV SOLUTION) ×1 IMPLANT
PACK SHOULDER (CUSTOM PROCEDURE TRAY) ×1 IMPLANT
PIN GUIDE 1.2 (PIN) IMPLANT
PIN GUIDE GLENOPHERE 1.5MX300M (PIN) IMPLANT
PIN METAGLENE 2.5 (PIN) IMPLANT
RESTRAINT HEAD UNIVERSAL NS (MISCELLANEOUS) ×1 IMPLANT
SCREW BN 18X4.5XSTRL SHLDR (Screw) IMPLANT
SCREW LOCK 42 (Screw) IMPLANT
SCREW LOCK DELTA XTEND 4.5X30 (Screw) IMPLANT
SLING ARM FOAM STRAP LRG (SOFTGOODS) IMPLANT
SLING ARM IMMOBILIZER LRG (SOFTGOODS) IMPLANT
SPIKE FLUID TRANSFER (MISCELLANEOUS) ×1 IMPLANT
SPONGE T-LAP 4X18 ~~LOC~~+RFID (SPONGE) IMPLANT
STEM 12 HA (Stem) IMPLANT
STRIP CLOSURE SKIN 1/2X4 (GAUZE/BANDAGES/DRESSINGS) ×1 IMPLANT
SUT FIBERWIRE #2 38 T-5 BLUE (SUTURE) ×1
SUT MNCRL AB 4-0 PS2 18 (SUTURE) ×1 IMPLANT
SUT VIC AB 0 CT1 36 (SUTURE) ×1 IMPLANT
SUT VIC AB 0 CT2 27 (SUTURE) ×1 IMPLANT
SUT VIC AB 2-0 CT1 TAPERPNT 27 (SUTURE) ×1 IMPLANT
SUTURE FIBERWR #2 38 T-5 BLUE (SUTURE) ×1 IMPLANT
TOWEL GREEN STERILE FF (TOWEL DISPOSABLE) ×1 IMPLANT
TOWEL OR 17X26 10 PK STRL BLUE (TOWEL DISPOSABLE) ×1 IMPLANT

## 2023-11-10 NOTE — Op Note (Signed)
NAME: Sean Hill, Sean Hill MEDICAL RECORD NO: [id number] ACCOUNT NO: [id number] DATE OF BIRTH: 1940/10/22 FACILITY: [facility] LOCATION: [facility] PHYSICIAN: Almedia Balls. Ranell Patrick, MD  Operative Report   DATE OF PROCEDURE: 11/10/2023  PREOPERATIVE DIAGNOSES:  Left shoulder rotator cuff tear arthropathy and shoulder pain.  POSTOPERATIVE DIAGNOSES:  Left shoulder rotator cuff tear arthropathy and shoulder pain.  PROCEDURE PERFORMED:  Left reverse total shoulder arthroplasty utilizing DePuy Delta Xtend prosthesis with no subscapularis repair.  ATTENDING SURGEON:  Almedia Balls. Ranell Patrick, MD  ASSISTANT:  Konrad Felix Dixon, [place], who was scrubbed during the entire procedure, and necessary for satisfactory completion of surgery.  ANESTHESIA:  General anesthesia was used plus the interscalene block.  ESTIMATED BLOOD LOSS:  200 mL.  FLUID REPLACEMENT:  1500 mL crystalloid.  COUNTS:  Instrument counts were correct.  COMPLICATIONS:  There were no complications.  ANTIBIOTICS:  Perioperative antibiotics were given.  INDICATIONS:  The patient is an 83 year old male who presents with a history of worsening left shoulder pain secondary to a history of an old shoulder fracture with progressive pain thought likely to rotator cuff insufficiency.  The patient has had  ongoing pain despite conservative management.  He does have early arthropathy in the shoulder.  We discussed options recommending reverse total shoulder arthroplasty to reestablish proper shoulder mechanics, decreased pain and improved function.  The  patient agreed.  Informed consent was obtained.  DESCRIPTION OF PROCEDURE:  After an adequate level of anesthesia was achieved, the patient was positioned in the modified beach chair position.  The left shoulder was correctly identified and sterile prep and drape were performed.  Timeout call verifying  correct patient and correct site.  We entered the patient's shoulder using a standard deltopectoral  approach starting at the coracoid process and extending down to the anterior humerus.  Utilizing the 10 blade scalpel, dissection down through  subcutaneous tissues using Bovie.  Cephalic vein was identified and taken laterally with the deltoid, pectoralis taken medially.  The conjoined tendon was identified and retracted medially.  Deep retractors were placed.  We tenodesed the biceps in situ  with 0 Vicryl figure-of-eight suture x2.  We then released the subscapularis subperiosteally off the lesser tuberosity intact for protection of the axillary nerve.  There was extensive delamination and tearing of the subscapularis and we felt like it was  not repairable.  We then released the inferior capsule extending the shoulder and delivering the humeral head out of the wound.  There was advanced cartilage loss on the humeral head and somewhat on the glenoid with significant thinning.  We went ahead  and entered the proximal humerus with a 6-mm reamer, reaming it up to a size 12.  We then placed our 12-mm T-handle guide and resected the head at 20 degrees of retroversion with the oscillating saw.  We removed excess osteophytes with a rongeur.  We  then retracted the humerus posteriorly and then placed our deep retractors for the glenoid.  We removed the capsule and the labrum, the biceps stump, and removed the remaining cartilage off the glenoid.  We then placed our deep retractors gaining good  visualization.  We placed our guide pin centered low bicortically at the base of the glenoid for the reaming for the Metaglene baseplate.  Once we had our guide pin in position, we reamed down to subchondral bone with the powered reamer.  We then did our  peripheral T-handle reaming to clear space for the glenosphere.  Next, we drilled out our central peg  hole.  We then impacted the HA-coated press-fit baseplate into position.  We placed a 42 screw inferiorly, a 36 screw superiorly, and an 18 anteriorly  and an 18  posteriorly.  With good screw purchase and baseplate stable, we selected the 42+0 standard glenosphere and attached that to the baseplate with a screwdriver.  I did a finger sweep to make sure we had no soft tissue caught up between the  baseplate and the glenosphere.  We then went to the humeral side and reamed for the two left metaphysis.  We then trialed with the 12 stem and the two left metaphysis set in the 0 setting and placed in 20 degrees of retroversion.  We placed a 42+3 poly  trial on the humeral tray, reduced the shoulder and were happy with her soft tissue balancing with appropriate conjoined tension and no gapping with inferior pole or external rotation.  We then removed all trial components from the humerus.  We irrigated  thoroughly.  We used available bone graft from the humeral head and impaction grafting technique with the HA-coated 12 stem and the two left metaphysis set on the 0 setting and placed in 20 degrees of retroversion.  Once that stem was down, we selected  the real 42+3 poly, impacted that in the humeral tray, reduced the shoulder and we were happy with our soft tissue balancing.  We ranged the shoulder.  There was no impingement.  There was no gapping.  The conjoined was appropriately tight, stable  throughout a full arc of motion with no impingement.  We irrigated again.  We then went ahead and resected the subscapularis remnant.  We then repaired the deltopectoral interval with 0 Vicryl suture followed by 2-0 Vicryl for subcutaneous closure and  4-0 running Monocryl for skin.  Steri-Strips were applied followed by a sterile dressing.  The patient tolerated the surgery well.   PUS D: 11/10/2023 4:21:05 pm T: 11/10/2023 5:12:00 pm  JOB: [id number]/ [id number]

## 2023-11-10 NOTE — Care Plan (Signed)
Ortho Bundle Case Management Note  Patient Details  Name: Sean Hill MRN: [id number] Date of Birth: 11-16-1940                  Lt Reverse TSA on 11/10/23 DCP: Home with wife DME: No needs PT: HEP   DME Arranged:  N/A DME Agency:       Additional Comments: Please contact me with any questions of if this plan should need to change.   Ennis Forts, RN,CCM [facility]  [phone] 11/10/2023, 3:06 PM

## 2023-11-10 NOTE — Interval H&P Note (Signed)
History and Physical Interval Note:  11/10/2023 12:35 PM  Sean Hill  has presented today for surgery, with the diagnosis of Left shoulder rotator cuff arthropathy.  The various methods of treatment have been discussed with the patient and family. After consideration of risks, benefits and other options for treatment, the patient has consented to  Procedure(s) with comments: REVERSE SHOULDER ARTHROPLASTY (Left) - interscalene block, flip room as a surgical intervention.  The patient's history has been reviewed, patient examined, no change in status, stable for surgery.  I have reviewed the patient's chart and labs.  Questions were answered to the patient's satisfaction.     Verlee Rossetti

## 2023-11-10 NOTE — Anesthesia Postprocedure Evaluation (Signed)
Anesthesia Post Note  Patient: Sean Hill  Procedure(s) Performed: REVERSE SHOULDER ARTHROPLASTY (Left: Shoulder)     Patient location during evaluation: PACU Anesthesia Type: Regional and General Level of consciousness: awake and alert Pain management: pain level controlled Vital Signs Assessment: post-procedure vital signs reviewed and stable Respiratory status: spontaneous breathing, nonlabored ventilation, respiratory function stable and patient connected to nasal cannula oxygen Cardiovascular status: blood pressure returned to baseline and stable Postop Assessment: no apparent nausea or vomiting Anesthetic complications: no   No notable events documented.  Last Vitals:  Vitals:   11/10/23 1700 11/10/23 1734  BP: 125/74 126/74  Pulse: 82 76  Resp: 20   Temp:    SpO2: 91% 91%    Last Pain:  Vitals:   11/10/23 1734  TempSrc:   PainSc: 0-No pain                 Harney Nation

## 2023-11-10 NOTE — Anesthesia Procedure Notes (Signed)
Anesthesia Regional Block: Interscalene brachial plexus block   Pre-Anesthetic Checklist: , timeout performed,  Correct Patient, Correct Site, Correct Laterality,  Correct Procedure, Correct Position, site marked,  Risks and benefits discussed,  Surgical consent,  Pre-op evaluation,  At surgeon's request and post-op pain management  Laterality: Left  Prep: chloraprep       Needles:  Injection technique: Single-shot  Needle Type: Echogenic Stimulator Needle     Needle Length: 5cm  Needle Gauge: 22     Additional Needles:   Procedures:, nerve stimulator,,, ultrasound used (permanent image in chart),,     Nerve Stimulator or Paresthesia:  Response: hand, 0.45 mA  Additional Responses:   Narrative:  Start time: 11/10/2023 12:50 PM End time: 11/10/2023 1:01 PM Injection made incrementally with aspirations every 5 mL.  Performed by: Personally  Anesthesiologist: Bethena Midget, MD  Additional Notes: Functioning IV was confirmed and monitors were applied.  A 50mm 22ga Arrow echogenic stimulator needle was used. Sterile prep and drape,hand hygiene and sterile gloves were used. Ultrasound guidance: relevant anatomy identified, needle position confirmed, local anesthetic spread visualized around nerve(s)., vascular puncture avoided.  Image printed for medical record. Negative aspiration and negative test dose prior to incremental administration of local anesthetic. The patient tolerated the procedure well.

## 2023-11-10 NOTE — Discharge Instructions (Signed)
Ice to the shoulder constantly.  Change the bandage to the Aquacel on Sunday. Keep the incision covered and clean and dry for one week, then ok to get it wet in the shower. Leave the incision uncovered after one week and open to air.   Do exercise as instructed several times per day.  DO NOT reach behind your back or push up out of a chair with the operative arm.  Use a sling while you are up and around for comfort, may remove while seated.  Keep pillow propped behind the operative elbow.  Follow up with Dr Ranell Patrick in two weeks in the office, call [phone] for appt  Please call Dr Ranell Patrick (cell) [phone] with any questions or concerns

## 2023-11-10 NOTE — Transfer of Care (Signed)
Immediate Anesthesia Transfer of Care Note  Patient: Sean Hill  Procedure(s) Performed: Procedure(s) with comments: REVERSE SHOULDER ARTHROPLASTY (Left) - interscalene block, flip room  Patient Location: PACU  Anesthesia Type:GA combined with regional for post-op pain  Level of Consciousness:  sedated, patient cooperative and responds to stimulation  Airway & Oxygen Therapy:Patient Spontanous Breathing and Patient connected to face mask oxgen  Post-op Assessment:  Report given to PACU RN and Post -op Vital signs reviewed and stable  Post vital signs:  Reviewed and stable  Last Vitals:  Vitals:   11/10/23 1245 11/10/23 1250  BP: (!) 144/74 137/82  Pulse: 63 70  Resp: 15 18  Temp:    SpO2: 94% 95%    Complications: No apparent anesthesia complications

## 2023-11-10 NOTE — Anesthesia Preprocedure Evaluation (Addendum)
Anesthesia Evaluation  Patient identified by MRN, date of birth, ID band Patient awake    Reviewed: Allergy & Precautions, NPO status , Patient's Chart, lab work & pertinent test results  History of Anesthesia Complications (+) DIFFICULT AIRWAY and history of anesthetic complications  Airway Mallampati: IV  TM Distance: >3 FB Neck ROM: Full    Dental no notable dental hx. (+) Teeth Intact, Dental Advisory Given   Pulmonary asthma    Pulmonary exam normal breath sounds clear to auscultation       Cardiovascular hypertension, Pt. on medications Normal cardiovascular exam Rhythm:Regular Rate:Normal  TTE 2017 - Left ventricle: The cavity size was normal. Wall thickness was  increased in a pattern of mild LVH. Systolic function was normal.  The estimated ejection fraction was in the range of 60% to 65%.  Doppler parameters are consistent with abnormal left ventricular  relaxation (grade 1 diastolic dysfunction).  - Mitral valve: There was mild regurgitation.  - Pulmonary arteries: PA peak pressure: 33 mm Hg (S).   Neuro/Psych  PSYCHIATRIC DISORDERS Anxiety Depression Bipolar Disorder   negative neurological ROS     GI/Hepatic Neg liver ROS,GERD  Controlled and Medicated,,  Endo/Other  diabetes, Oral Hypoglycemic Agents  Obese BMI 37  Renal/GU negative Renal ROS  negative genitourinary   Musculoskeletal  (+) Arthritis , Osteoarthritis,    Abdominal  (+) + obese  Peds  Hematology negative hematology ROS (+)   Anesthesia Other Findings Previous airway Induction Type: IV induction Ventilation: Mask ventilation without difficulty and Oral airway inserted - appropriate to patient size Laryngoscope Size: Mac and 4 Grade View: Grade II Tube type: Oral Tube size: 7.5 mm Number of attempts: 1 Airway Equipment and Method: Stylet Placement Confirmation: ETT inserted through vocal cords under direct vision, positive  ETCO2 and breath sounds checked- equal and bilateral Secured at: 23 cm   Reproductive/Obstetrics                             Anesthesia Physical Anesthesia Plan  ASA: 3  Anesthesia Plan: General and Regional   Post-op Pain Management: Minimal or no pain anticipated and Regional block*   Induction: Intravenous  PONV Risk Score and Plan: 2 and Treatment may vary due to age or medical condition, Ondansetron and Dexamethasone  Airway Management Planned: Oral ETT and Video Laryngoscope Planned  Additional Equipment: None  Intra-op Plan:   Post-operative Plan: Extubation in OR  Informed Consent: I have reviewed the patients History and Physical, chart, labs and discussed the procedure including the risks, benefits and alternatives for the proposed anesthesia with the patient or authorized representative who has indicated his/her understanding and acceptance.     Dental advisory given  Plan Discussed with: CRNA and Anesthesiologist  Anesthesia Plan Comments: (DISCUSSION: Sean Hill is an 83 year old male who presents to PAT prior to surgery above.  Past medical history significant for hypertension, left bundle branch block, chronic cough and DOE, GERD, prediabetes, arthritis, anxiety, bipolar disorder, hearing impairment   Prior anesthesia complications include difficult intubation (unclear during which surgery).  Last airway note in 2022 notes grade 2 view with MAC 4 with no difficulty.  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   Patient with known history of LBBB and chronic DOE.  He has been evaluated by cardiology in the past for this. Echo in August 2023 demonstrated a low normal EF.  There was mild to moderate TR.  The aorta was estimated to be 41 mm.  Thre was very mild AS.  He had normal coronaries on cath in 2005. Last seen by Dr. Antoine Poche in 2023.  He recommended CTA of the chest due to DOE which showed scattered coronary artery  calcifications but otherwise no acute findings.  Thought not to be cardiac etiology. Advised to complete PFTs which were normal.  Referred back to his PCP.   Patient has been evaluated by pulmonology.  Last seen on 9//24 by Dr. Sherene Sires for chronic cough and DOE. Per Dr. Sherene Sires: "most likely this is deconditioning assocw with wt gain / no evidence of airflow obst of any type so try off Adair dpi due to concerns for uacs (see separate a/p) and use neb prn"   Patient seen by PCP on 2/14 due to urinary symptoms. Treated for UTI with Cipro x 7 days. KUB obtained and results pending. Urine culture obtained and there is no growth. Discussed with Dr. Dietrich Pates office who are aware and ok to proceed.   VS: BP (!) 145/72   Pulse 66   Temp 36.8 C (Oral)   Resp 18   Ht 5\' 10"  (1.778 m)   Wt 121.6 kg   SpO2 96%   BMI 38.45 kg/m  EKG 10/31/23:   Normal sinus rhythm, rate 66 Left bundle branch block   CV:   Echo 05/03/2022:   IMPRESSIONS      1. Abnormal septal motion from LBBB . Left ventricular ejection fraction, by estimation, is 50 to 55%. The left ventricle has low normal function. The left ventricle has no regional wall motion abnormalities. There is mild left ventricular hypertrophy. Left ventricular diastolic parameters were normal. The average left ventricular global longitudinal strain is -27.0 %. The global longitudinal strain is normal.  2. Right ventricular systolic function is normal. The right ventricular size is normal. There is normal pulmonary artery systolic pressure.  3. Left atrial size was moderately dilated.  4. The mitral valve is abnormal. Trivial mitral valve regurgitation. No evidence of mitral stenosis.  5. Tricuspid valve regurgitation is mild to moderate.  6. The aortic valve is tricuspid. There is mild calcification of the aortic valve. There is mild thickening of the aortic valve. Aortic valve regurgitation is not visualized. Aortic valve sclerosis is present,  with no evidence of aortic valve stenosis.  7. Aortic dilatation noted. There is moderate dilatation of the aortic root, measuring 41 mm. There is mild dilatation of the ascending aorta, measuring 38 mm.  8. The inferior vena cava is normal in size with greater than 50% respiratory variability, suggesting right atrial pressure of 3 mmHg.     )        Anesthesia Quick Evaluation

## 2023-11-10 NOTE — Evaluation (Signed)
Physical Therapy Brief Evaluation and Discharge Note Patient Details Name: Sean Hill MRN: [id number] DOB: 18-Aug-1941 Today's Date: 11/10/2023   History of Present Illness  83 yo s/p Reverse shoulder arthoplasty LEFT 11/10/2023. PMH: asthma, Gerd, depression, HTN, B ankle fusion , ORIF R elbow fracture, R TKA 2022  Clinical Impression  Pt p/o L reverse shldr arthoplasty. Wife and dtr presetn for education. Was able to assess mobility pt got off stretcher and walked with one hand held in Right UE for steady to recliner. Tolerated well. Then reviewed safety and education with sling wear, postioning in bed, bed mobility , ice, hand/elbow/ and wrist exercises, predicted pain and swelling, neck ROM, and all handouts checked below. Also pursed lip breathing and incentive spirometer due to O2 sats around 89 %with quiet breathing and 93%with pursed lip intentional breathing. Nurse aware.   Everything complete , no further question from family or patient. Ready for DC from our aspect, nurse with f/u with all other medical items.    PT Assessment Patient does not need any further PT services  Assistance Needed at Discharge       Equipment Recommendations None recommended by PT  Recommendations for Other Services       Precautions/Restrictions Precautions Precautions: Shoulder Type of Shoulder Precautions: sling at all times unless in sstable controlled environemnt let down by his side and do hand, elbow wrist exercises as shown Shoulder Interventions: Shoulder sling/immobilizer (Left) Precaution Booklet Issued: Yes (comment) Precaution/Restrictions Comments: as mentioned above Required Braces or Orthoses: Sling Restrictions Weight Bearing Restrictions Per Provider Order: No LUE Weight Bearing Per Provider Order: Non weight bearing        Mobility  Bed Mobility          Transfers Overall transfer level: Needs assistance Equipment used: 1 person hand held assist Transfers:  Sit to/from Stand Sit to Stand: Contact guard assist                Ambulation/Gait Ambulation/Gait assistance: Contact guard assist Gait Distance (Feet): 10 Feet (stretcher to recliner in PACU area) Assistive device: 1 person hand held assist Gait Pattern/deviations: Step-through pattern      Home Activity Instructions    Stairs            Modified Rankin (Stroke Patients Only)        Balance Overall balance assessment: No apparent balance deficits (not formally assessed)                        Pertinent Vitals/Pain PT - Brief Vital Signs All Vital Signs Stable: No Exception to Vital Signs Stable: O2 on room air between 89-90% . Pain Assessment Pain Assessment: 0-10 Pain Score: 0-No pain Pain Location: bno pain due to anethesia , and block. Pt does have some sensation in posterior shoulder area and 3 of his distal finger tips.     Home Living Family/patient expects to be discharged to:: Private residence Living Arrangements: Spouse/significant other Available Help at Discharge: Family     Home Equipment: None        Prior Function Level of Independence: Independent      UE/LE Assessment   UE ROM/Strength/Tone/Coordination:  (Not tested due to in PACIU post op shoulder surgery. all other UE and LE were WNL)           Communication   Communication Communication: No apparent difficulties     Cognition Overall Cognitive Status: Appears within functional limits for tasks assessed/performed  General Comments      Exercises Shoulder Exercises Elbow Flexion: Other (comment) (reviewed with dtr and wife) Elbow Extension: Other (comment) (reviewed with dtr and wife) Wrist Flexion: Other (comment) (reviewed with dtr and wife) Wrist Extension: Other (comment) (reviewed with dtr and wife) Digit Composite Flexion: Other (comment) (reviewed with dtr and wife) Donning/doffing shirt without moving shoulder: Caregiver independent  with task (reviewed with dtr and wife) Donning/doffing sling/immobilizer: Caregiver independent with task (reviewed with dtr and wife) Correct positioning of sling/immobilizer: Caregiver independent with task (reviewed with dtr and wife) ROM for elbow, wrist and digits of operated UE: Caregiver independent with task (reviewed with dtr and wife) Sling wearing schedule (on at all times/off for ADL's): Caregiver independent with task (reviewed with dtr and wife) Positioning of UE while sleeping: Caregiver independent with task (reviewed with dtr and wife)   Assessment/Plan    PT Problem List         PT Visit Diagnosis Other (comment);Other abnormalities of gait and mobility (R26.89) (p/o LEft shoulder instructions for exercises, postiioning and precautions)    No Skilled PT All education completed;Patient will have necessary level of assist by caregiver at discharge   Co-evaluation                AMPAC 6 Clicks Help needed turning from your back to your side while in a flat bed without using bedrails?: A Little Help needed moving from lying on your back to sitting on the side of a flat bed without using bedrails?: A Little Help needed moving to and from a bed to a chair (including a wheelchair)?: A Little Help needed standing up from a chair using your arms (e.g., wheelchair or bedside chair)?: A Little Help needed to walk in hospital room?: A Little Help needed climbing 3-5 steps with a railing? : A Little 6 Click Score: 18      End of Session   Activity Tolerance: Patient tolerated treatment well Patient left: in chair;with family/visitor present;with nursing/sitter in room Nurse Communication: Mobility status PT Visit Diagnosis: Other (comment);Other abnormalities of gait and mobility (R26.89) (p/o LEft shoulder instructions for exercises, postiioning and precautions)     Time: 1730-1810 PT Time Calculation (min) (ACUTE ONLY): 40 min  Charges:   PT Evaluation $PT Eval  Low Complexity: 1 Low      Dejah Droessler, PT, MPT Acute Rehabilitation Services Office: [phone] If a weekend: secure chat groups: [facility] PT, [facility] OT, [facility] SLP 11/10/2023   Deanza Upperman, Clois Dupes  11/10/2023, 6:26 PM

## 2023-11-10 NOTE — Anesthesia Procedure Notes (Signed)
Procedure Name: Intubation Date/Time: 11/10/2023 2:59 PM  Performed by: Theodosia Quay, CRNAPre-anesthesia Checklist: Patient identified, Emergency Drugs available, Suction available, Patient being monitored and Timeout performed Patient Re-evaluated:Patient Re-evaluated prior to induction Oxygen Delivery Method: Circle system utilized Preoxygenation: Pre-oxygenation with 100% oxygen Induction Type: IV induction Ventilation: Mask ventilation without difficulty and Oral airway inserted - appropriate to patient size Laryngoscope Size: Glidescope and 3 Grade View: Grade I Tube type: Oral Tube size: 7.0 mm Number of attempts: 1 Airway Equipment and Method: Stylet Placement Confirmation: ETT inserted through vocal cords under direct vision, positive ETCO2, CO2 detector and breath sounds checked- equal and bilateral Secured at: 23 cm Tube secured with: Tape Dental Injury: Teeth and Oropharynx as per pre-operative assessment  Comments: ATOI, elective glide, beard with missing teeth.

## 2023-11-10 NOTE — Brief Op Note (Signed)
11/10/2023  4:15 PM  PATIENT:  Sean Hill  83 y.o. male  PRE-OPERATIVE DIAGNOSIS:  Left shoulder rotator cuff arthropathy  POST-OPERATIVE DIAGNOSIS:  Left shoulder rotator cuff arthropathy  PROCEDURE:  Procedure(s) with comments: REVERSE SHOULDER ARTHROPLASTY (Left) - interscalene block, flip room DePuy Delta Xtend with no subscap repair  SURGEON:  Surgeons and Role:    Beverely Low, MD - Primary  PHYSICIAN ASSISTANT:   ASSISTANTS: Thea Gist, PA-C   ANESTHESIA:   regional and general  EBL:  150 mL   BLOOD ADMINISTERED:none  DRAINS: none   LOCAL MEDICATIONS USED:  MARCAINE     SPECIMEN:  No Specimen  DISPOSITION OF SPECIMEN:  N/A  COUNTS:  YES  TOURNIQUET:  * No tourniquets in log *  DICTATION: .Other Dictation: Dictation Number [id number]  PLAN OF CARE: Discharge to home after PACU  PATIENT DISPOSITION:  PACU - hemodynamically stable.   Delay start of Pharmacological VTE agent (>24hrs) due to surgical blood loss or risk of bleeding: not applicable

## 2023-11-13 ENCOUNTER — Encounter (HOSPITAL_COMMUNITY): Payer: Self-pay | Admitting: Orthopedic Surgery

## 2023-11-14 ENCOUNTER — Encounter: Payer: Self-pay | Admitting: Internal Medicine

## 2023-11-14 ENCOUNTER — Encounter: Payer: Self-pay | Admitting: Adult Health

## 2023-11-14 ENCOUNTER — Ambulatory Visit (INDEPENDENT_AMBULATORY_CARE_PROVIDER_SITE_OTHER): Payer: PPO | Admitting: Internal Medicine

## 2023-11-14 VITALS — BP 130/70 | HR 76 | Temp 98.0°F | Wt 275.7 lb

## 2023-11-14 DIAGNOSIS — J069 Acute upper respiratory infection, unspecified: Secondary | ICD-10-CM | POA: Diagnosis not present

## 2023-11-14 DIAGNOSIS — R059 Cough, unspecified: Secondary | ICD-10-CM

## 2023-11-14 LAB — POCT INFLUENZA A/B
Influenza A, POC: NEGATIVE
Influenza B, POC: NEGATIVE

## 2023-11-14 LAB — POC COVID19 BINAXNOW: SARS Coronavirus 2 Ag: NEGATIVE

## 2023-11-14 NOTE — Progress Notes (Signed)
 Established Patient Office Visit     CC/Reason for Visit: Cough  HPI: Sean Hill is a 83 y.o. male who is coming in today for the above mentioned reasons he had a left shoulder replacement 4 days ago.  3 days ago he was visited by a sick grandchild.  That evening he started experiencing congestion and cough productive of yellow mucus.  No fever.   Past Medical/Surgical History: Past Medical History:  Diagnosis Date   Anal fissure    Anxiety    Arthritis    right wrist; pt. states "everywhere"   Asthma    Bipolar disorder (HCC)    Cataract    Depression    Difficult airway for intubation    GERD (gastroesophageal reflux disease)    Hemorrhage of colon following colonoscopy 10/08/2019   History of kidney stones    History of MRSA infection    left knee after arthroplasty   Hyperlipidemia    Hypertension    states under control with med., has been on med. x 1 yr.   Impaired hearing    Left bundle branch block (LBBB) on electrocardiogram 10/29/2015   Macular degeneration (senile) of retina    left   Pre-diabetes     Past Surgical History:  Procedure Laterality Date   ANKLE FUSION Bilateral    ANKLE SURGERY Bilateral    ligament surgery   BIOPSY  04/29/2020   Procedure: BIOPSY;  Surgeon: Shellia Cleverly, DO;  Location: [facility];  Service: Gastroenterology;;   BIOPSY  07/14/2022   Procedure: BIOPSY;  Surgeon: Shellia Cleverly, DO;  Location: [facility];  Service: Gastroenterology;;   CARDIAC CATHETERIZATION  x 2   1983; 11/14/2003   CARPOMETACARPEL SUSPENSION PLASTY Right 09/22/2016   Procedure: right thumb carpometacarpal  ARTHROPLASTY;  Surgeon: Betha Loa, MD;  Location: [facility];  Service: Orthopedics;  Laterality: Right;  right thumb carpometacarpal  ARTHROPLASTY   CARPOMETACARPEL SUSPENSION PLASTY Right 11/02/2017   Procedure: RIGHT THUMB SUSPENSIONPLASTY WITH TIGHTROPE, DISTAL POLE SCAPHOID  EXCISION;  Surgeon: Betha Loa, MD;  Location: [facility];  Service: Orthopedics;  Laterality: Right;   COLONOSCOPY WITH PROPOFOL N/A 10/02/2019   Procedure: COLONOSCOPY WITH PROPOFOL;  Surgeon: Shellia Cleverly, DO;  Location: [facility];  Service: Gastroenterology;  Laterality: N/A;   COLONOSCOPY WITH PROPOFOL N/A 10/09/2019   Procedure: COLONOSCOPY WITH PROPOFOL;  Surgeon: Iva Boop, MD;  Location: [facility];  Service: Endoscopy;  Laterality: N/A;   COLONOSCOPY WITH PROPOFOL N/A 04/29/2020   Procedure: COLONOSCOPY WITH PROPOFOL;  Surgeon: Shellia Cleverly, DO;  Location: [facility];  Service: Gastroenterology;  Laterality: N/A;   COLONOSCOPY WITH PROPOFOL N/A 11/11/2021   Procedure: COLONOSCOPY WITH PROPOFOL;  Surgeon: Shellia Cleverly, DO;  Location: [facility];  Service: Gastroenterology;  Laterality: N/A;   ELBOW SURGERY Left    ENDOSCOPIC MUCOSAL RESECTION N/A 10/02/2019   Procedure: ENDOSCOPIC MUCOSAL RESECTION;  Surgeon: Shellia Cleverly, DO;  Location: [facility];  Service: Gastroenterology;  Laterality: N/A;   ESOPHAGOGASTRODUODENOSCOPY (EGD) WITH PROPOFOL N/A 07/14/2022   Procedure: ESOPHAGOGASTRODUODENOSCOPY (EGD) WITH PROPOFOL;  Surgeon: Shellia Cleverly, DO;  Location: [facility];  Service: Gastroenterology;  Laterality: N/A;   HEMOSTASIS CLIP PLACEMENT  10/09/2019   Procedure: HEMOSTASIS CLIP PLACEMENT;  Surgeon: Iva Boop, MD;  Location: [facility];  Service: Endoscopy;;   HEMOSTASIS CLIP PLACEMENT  11/11/2021   Procedure: HEMOSTASIS CLIP PLACEMENT;  Surgeon: Shellia Cleverly, DO;  Location:  [facility];  Service: Gastroenterology;;   HEMOSTASIS CLIP PLACEMENT  07/14/2022   Procedure: HEMOSTASIS CLIP PLACEMENT;  Surgeon: Shellia Cleverly, DO;  Location: [facility];  Service: Gastroenterology;;   INGUINAL HERNIA REPAIR Right    KNEE SURGERY     OPEN REDUCTION INTERNAL FIXATION (ORIF) DISTAL RADIAL FRACTURE Right 10/29/2015   Procedure: OPEN  REDUCTION INTERNAL FIXATION (ORIF) DISTAL RADIAL FRACTURE;  Surgeon: Betha Loa, MD;  Location: [facility];  Service: Orthopedics;  Laterality: Right;   ORIF ELBOW FRACTURE Right    POLYPECTOMY  04/29/2020   Procedure: POLYPECTOMY;  Surgeon: Shellia Cleverly, DO;  Location: [facility];  Service: Gastroenterology;;   POLYPECTOMY  11/11/2021   Procedure: POLYPECTOMY;  Surgeon: Shellia Cleverly, DO;  Location: [facility];  Service: Gastroenterology;;   POLYPECTOMY  07/14/2022   Procedure: POLYPECTOMY;  Surgeon: Shellia Cleverly, DO;  Location: [facility];  Service: Gastroenterology;;   REVERSE SHOULDER ARTHROPLASTY Left 11/10/2023   Procedure: REVERSE SHOULDER ARTHROPLASTY;  Surgeon: Beverely Low, MD;  Location: [facility];  Service: Orthopedics;  Laterality: Left;  interscalene block, flip room   SAVORY DILATION N/A 07/14/2022   Procedure: SAVORY DILATION;  Surgeon: Shellia Cleverly, DO;  Location: [facility];  Service: Gastroenterology;  Laterality: N/A;   SUBMUCOSAL LIFTING INJECTION  10/02/2019   Procedure: SUBMUCOSAL LIFTING INJECTION;  Surgeon: Shellia Cleverly, DO;  Location: [facility];  Service: Gastroenterology;;   TONSILLECTOMY     TOTAL KNEE ARTHROPLASTY Bilateral    TOTAL KNEE REVISION Right 08/25/2021   Procedure: Right knee polyethylene vs total knee arthroplasty revision;  Surgeon: Ollen Gross, MD;  Location: [facility];  Service: Orthopedics;  Laterality: Right;   ULNAR COLLATERAL LIGAMENT REPAIR Right 12/29/2015   Procedure: REPAIR  RIGHT LATERAL ULNAR COLLATERAL LIGAMENT TEAR  EXTENSOR ORIGIN ;  Surgeon: Betha Loa, MD;  Location: [facility];  Service: Orthopedics;  Laterality: Right;   WRIST ARTHROSCOPY WITH DEBRIDEMENT Right 07/14/2016   Procedure: RIGHT WRIST ARTHROSCOPY WITH DEBRIDEMENT  TRIANGULAR FIBROCARTILAGE COMPLEX;  Surgeon: Betha Loa, MD;  Location: [facility];  Service: Orthopedics;  Laterality: Right;     Social History:  reports that he has never smoked. He has never used smokeless tobacco. He reports that he does not currently use alcohol. He reports that he does not use drugs.  Allergies: Allergies  Allergen Reactions   Penicillins Other (See Comments)    BLISTERS Did it involve swelling of the face/tongue/throat, SOB, or low BP? No Did it involve sudden or severe rash/hives, skin peeling, or any reaction on the inside of your mouth or nose? No Did you need to seek medical attention at a hospital or doctor's office? No When did it last happen? around 1980    If all above answers are "NO", may proceed with cephalosporin use. Tolerated Cephalosporin Date: 08/26/21.     Lisinopril Diarrhea and Cough    Dry cough, abdominal bloating and diarrhea   Zetia [Ezetimibe]     Myalgia     Family History:  Family History  Problem Relation Age of Onset   Alcohol abuse Mother    Ovarian cancer Mother    Alcohol abuse Father    Pancreatic cancer Father    Hyperlipidemia Brother    Barrett's esophagus Brother    Colon cancer Maternal Grandmother    Depression Daughter    Paranoid behavior Daughter    Esophageal cancer Neg Hx    Stomach cancer Neg Hx  Rectal cancer Neg Hx    Colon polyps Neg Hx      Current Outpatient Medications:    albuterol (VENTOLIN HFA) 108 (90 Base) MCG/ACT inhaler, Inhale 2 puffs into the lungs every 6 (six) hours as needed for wheezing or shortness of breath., Disp: 6.7 g, Rfl: 3   ARIPiprazole ER (ABILIFY MAINTENA) 400 MG PRSY prefilled syringe, Inject 400 mg into the muscle every 28 (twenty-eight) days., Disp: , Rfl:    azelastine (ASTELIN) 0.1 % nasal spray, Place 2 sprays into both nostrils 2 (two) times daily. Use in each nostril as directed, Disp: 30 mL, Rfl: 6   cetirizine (ZYRTEC) 10 MG tablet, TAKE 1 TABLET (10 MG TOTAL) BY MOUTH EVERY MORNING., Disp: 90 tablet, Rfl: 0   chlorhexidine (HIBICLENS) 4 % external liquid, Apply 15 mLs (1 Application  total) topically as directed for 30 doses. Use as directed daily for 5 days every other week for 6 weeks., Disp: 946 mL, Rfl: 1   citalopram (CELEXA) 20 MG tablet, Take 1 tablet (20 mg total) by mouth daily., Disp: 90 tablet, Rfl: 0   fenofibrate (TRICOR) 145 MG tablet, TAKE 1 TABLET BY MOUTH EVERY DAY, Disp: 90 tablet, Rfl: 0   ipratropium-albuterol (DUONEB) 0.5-2.5 (3) MG/3ML SOLN, INHALE 3 ML BY NEBULIZER EVERY 6 HOURS AS NEEDED, Disp: 360 mL, Rfl: 0   losartan (COZAAR) 50 MG tablet, TAKE 1 TABLET BY MOUTH EVERY MORNING, Disp: 90 tablet, Rfl: 0   metFORMIN (GLUCOPHAGE) 500 MG tablet, TAKE 1 TABLET (500 MG TOTAL) BY MOUTH DAILY., Disp: 90 tablet, Rfl: 0   methocarbamol (ROBAXIN) 500 MG tablet, Take 1 tablet (500 mg total) by mouth every 8 (eight) hours as needed for muscle spasms., Disp: 40 tablet, Rfl: 1   Multiple Vitamin (MULTIVITAMIN WITH MINERALS) TABS tablet, Take 1 tablet by mouth daily., Disp: , Rfl:    Multiple Vitamins-Minerals (PRESERVISION AREDS 2 PO), Take 1 capsule by mouth 2 (two) times daily., Disp: , Rfl:    mupirocin ointment (BACTROBAN) 2 %, Place 1 Application into the nose 2 (two) times daily for 60 doses. Use as directed 2 times daily for 5 days every other week for 6 weeks., Disp: 60 g, Rfl: 0   pantoprazole (PROTONIX) 40 MG tablet, TAKE 1 TABLET BY MOUTH 2 (TWO) TIMES DAILY. PLEASE KEEP APPOINTMENT SCHEDULED FOR 12-04-23 AT 840AM, Disp: 180 tablet, Rfl: 0   temazepam (RESTORIL) 15 MG capsule, Take 1 capsule (15 mg total) by mouth at bedtime., Disp: 90 capsule, Rfl: 0   VASCEPA 1 g capsule, TAKE TWO CAPSULES BY MOUTH TWICE DAILY, Disp: 360 capsule, Rfl: 3  Review of Systems:  Negative unless indicated in HPI.   Physical Exam: Vitals:   11/14/23 0904  BP: 130/70  Pulse: 76  Temp: 98 F (36.7 C)  TempSrc: Oral  SpO2: 96%  Weight: 275 lb 11.2 oz (125.1 kg)    Body mass index is 39.56 kg/m.   Physical Exam Vitals reviewed.  Constitutional:      Appearance:  Normal appearance.  HENT:     Right Ear: Tympanic membrane, ear canal and external ear normal.     Left Ear: Tympanic membrane, ear canal and external ear normal.     Mouth/Throat:     Mouth: Mucous membranes are moist.     Pharynx: Oropharynx is clear.  Eyes:     Conjunctiva/sclera: Conjunctivae normal.  Cardiovascular:     Rate and Rhythm: Normal rate and regular rhythm.  Pulmonary:  Effort: Pulmonary effort is normal.     Breath sounds: Normal breath sounds.  Neurological:     Mental Status: He is alert.      Impression and Plan:  Cough, unspecified type -     POCT Influenza A/B -     POC COVID-19 BinaxNow  Viral URI with cough   -In office flu and COVID tests are negative. -Given exam findings, PNA, pharyngitis, ear infection are not likely, hence abx have not been prescribed. -Have advised rest, fluids, OTC antihistamines, cough suppressants and mucinex. -RTC if no improvement in 10-14 days.    Time spent:22 minutes reviewing chart, interviewing and examining patient and formulating plan of care.     Chaya Jan, MD [facility]

## 2023-11-15 NOTE — Telephone Encounter (Signed)
 FYI

## 2023-11-22 ENCOUNTER — Ambulatory Visit (HOSPITAL_BASED_OUTPATIENT_CLINIC_OR_DEPARTMENT_OTHER)

## 2023-11-22 ENCOUNTER — Encounter: Payer: Self-pay | Admitting: Adult Health

## 2023-11-22 ENCOUNTER — Ambulatory Visit (INDEPENDENT_AMBULATORY_CARE_PROVIDER_SITE_OTHER): Admitting: Adult Health

## 2023-11-22 VITALS — BP 136/68 | HR 93 | Ht 70.0 in | Wt 269.0 lb

## 2023-11-22 VITALS — BP 130/80 | HR 94 | Temp 98.1°F | Ht 70.0 in | Wt 268.0 lb

## 2023-11-22 DIAGNOSIS — N401 Enlarged prostate with lower urinary tract symptoms: Secondary | ICD-10-CM | POA: Diagnosis not present

## 2023-11-22 DIAGNOSIS — J069 Acute upper respiratory infection, unspecified: Secondary | ICD-10-CM

## 2023-11-22 DIAGNOSIS — F3131 Bipolar disorder, current episode depressed, mild: Secondary | ICD-10-CM

## 2023-11-22 MED ORDER — TAMSULOSIN HCL 0.4 MG PO CAPS
0.4000 mg | ORAL_CAPSULE | Freq: Every day | ORAL | 1 refills | Status: DC
Start: 2023-11-22 — End: 2024-05-17

## 2023-11-22 MED ORDER — ARIPIPRAZOLE ER 400 MG IM PRSY
400.0000 mg | PREFILLED_SYRINGE | INTRAMUSCULAR | Status: AC
  Administered 2023-11-22 – 2024-10-23 (×12): 400 mg via INTRAMUSCULAR

## 2023-11-22 MED ORDER — PREDNISONE 10 MG PO TABS: ORAL_TABLET | ORAL | 0 refills | Status: DC

## 2023-11-22 MED ORDER — FINASTERIDE 5 MG PO TABS: 5.0000 mg | ORAL_TABLET | Freq: Every day | ORAL | 1 refills | Status: DC

## 2023-11-22 MED ORDER — HYDROCODONE BIT-HOMATROP MBR 5-1.5 MG/5ML PO SOLN
5.0000 mL | Freq: Three times a day (TID) | ORAL | 0 refills | Status: AC | PRN
Start: 2023-11-22 — End: ?

## 2023-11-22 NOTE — Progress Notes (Signed)
 Subjective:    Patient ID: Sean Hill, male    DOB: 23-Jun-1941, 83 y.o.   MRN: [id number]  HPI 83 year old  male who  has a past medical history of Anal fissure, Anxiety, Arthritis, Asthma, Bipolar disorder (HCC), Cataract, Depression, Difficult airway for intubation, GERD (gastroesophageal reflux disease), Hemorrhage of colon following colonoscopy (10/08/2019), History of kidney stones, History of MRSA infection, Hyperlipidemia, Hypertension, Impaired hearing, Left bundle branch block (LBBB) on electrocardiogram (10/29/2015), Macular degeneration (senile) of retina, and Pre-diabetes.  He presents to the office today for an acute visit.  Was seen about a week ago by another provider in the office for reductive cough with yellow mucus and chest congestion.  His symptoms had only been present for roughly 3 days at that point.  It was thought that his symptoms were viral and he was advised to use over-the-counter medications.  He reports today that his symptoms are no better nor no worse either.  His cough has become drier but he still is coughing constantly which keeps him up at night and he is not able to sleep.  He does have wheezing and chest congestion.  He has not had any fevers or chills.  He does have some mild shortness of breath with exertion.  He has also been using his nebulizer at home which helps but this is short-term.  Additionally, he is also experiencing frequent urination, decreased stream, incomplete bladder emptying, and nocturia.  This has been an ongoing issue but he is urinating roughly 15 times a day.  He did have a urinalysis with urine culture done weeks ago which was negative.  He was experience symptoms at this time.  In the past he has trialed Flomax 0.8 mg and Myrbetriq without any improvement.  He is wondering if there is anything else he can use to help with BPH symptoms. He is not having any dysuria or hematuria    Review of Systems See HPI   Past Medical  History:  Diagnosis Date   Anal fissure    Anxiety    Arthritis    right wrist; pt. states "everywhere"   Asthma    Bipolar disorder (HCC)    Cataract    Depression    Difficult airway for intubation    GERD (gastroesophageal reflux disease)    Hemorrhage of colon following colonoscopy 10/08/2019   History of kidney stones    History of MRSA infection    left knee after arthroplasty   Hyperlipidemia    Hypertension    states under control with med., has been on med. x 1 yr.   Impaired hearing    Left bundle branch block (LBBB) on electrocardiogram 10/29/2015   Macular degeneration (senile) of retina    left   Pre-diabetes     Social History   Socioeconomic History   Marital status: Married    Spouse name: Not on file   Number of children: Not on file   Years of education: Not on file   Highest education level: Associate degree: occupational, Scientist, product/process development, or vocational program  Occupational History   Not on file  Tobacco Use   Smoking status: Never   Smokeless tobacco: Never  Vaping Use   Vaping status: Never Used  Substance and Sexual Activity   Alcohol use: Not Currently    Comment: occasional.   Drug use: No   Sexual activity: Not Currently    Birth control/protection: None  Other Topics Concern   Not on file  Social History Narrative   Married, lives in [place].  Retired Quarry manager.   Two daughters, both married,3 grandchildren    No regular exercise.  Never smoker.  No ETOH.  No drugs.   Social Drivers of Corporate investment banker Strain: Low Risk  (11/02/2023)   Overall Financial Resource Strain (CARDIA)    Difficulty of Paying Living Expenses: Not hard at all  Food Insecurity: No Food Insecurity (11/02/2023)   Hunger Vital Sign    Worried About Running Out of Food in the Last Year: Never true    Ran Out of Food in the Last Year: Never true  Transportation Needs: No Transportation Needs (11/02/2023)   PRAPARE - Scientist, research (physical sciences) (Medical): No    Lack of Transportation (Non-Medical): No  Physical Activity: Insufficiently Active (11/02/2023)   Exercise Vital Sign    Days of Exercise per Week: 1 day    Minutes of Exercise per Session: 10 min  Stress: No Stress Concern Present (11/02/2023)   [employer] of Occupational Health - Occupational Stress Questionnaire    Feeling of Stress : Not at all  Social Connections: Moderately Isolated (11/02/2023)   Social Connection and Isolation Panel [NHANES]    Frequency of Communication with Friends and Family: Twice a week    Frequency of Social Gatherings with Friends and Family: Once a week    Attends Religious Services: Never    Database administrator or Organizations: No    Attends Engineer, structural: Not on file    Marital Status: Married  Catering manager Violence: Not At Risk (09/26/2022)   Humiliation, Afraid, Rape, and Kick questionnaire    Fear of Current or Ex-Partner: No    Emotionally Abused: No    Physically Abused: No    Sexually Abused: No    Past Surgical History:  Procedure Laterality Date   ANKLE FUSION Bilateral    ANKLE SURGERY Bilateral    ligament surgery   BIOPSY  04/29/2020   Procedure: BIOPSY;  Surgeon: Shellia Cleverly, DO;  Location: [facility];  Service: Gastroenterology;;   BIOPSY  07/14/2022   Procedure: BIOPSY;  Surgeon: Shellia Cleverly, DO;  Location: [facility];  Service: Gastroenterology;;   CARDIAC CATHETERIZATION  x 2   1983; 11/14/2003   CARPOMETACARPEL SUSPENSION PLASTY Right 09/22/2016   Procedure: right thumb carpometacarpal  ARTHROPLASTY;  Surgeon: Betha Loa, MD;  Location: [facility];  Service: Orthopedics;  Laterality: Right;  right thumb carpometacarpal  ARTHROPLASTY   CARPOMETACARPEL SUSPENSION PLASTY Right 11/02/2017   Procedure: RIGHT THUMB SUSPENSIONPLASTY WITH TIGHTROPE, DISTAL POLE SCAPHOID  EXCISION;  Surgeon: Betha Loa, MD;  Location: [facility];   Service: Orthopedics;  Laterality: Right;   COLONOSCOPY WITH PROPOFOL N/A 10/02/2019   Procedure: COLONOSCOPY WITH PROPOFOL;  Surgeon: Shellia Cleverly, DO;  Location: [facility];  Service: Gastroenterology;  Laterality: N/A;   COLONOSCOPY WITH PROPOFOL N/A 10/09/2019   Procedure: COLONOSCOPY WITH PROPOFOL;  Surgeon: Iva Boop, MD;  Location: [facility];  Service: Endoscopy;  Laterality: N/A;   COLONOSCOPY WITH PROPOFOL N/A 04/29/2020   Procedure: COLONOSCOPY WITH PROPOFOL;  Surgeon: Shellia Cleverly, DO;  Location: [facility];  Service: Gastroenterology;  Laterality: N/A;   COLONOSCOPY WITH PROPOFOL N/A 11/11/2021   Procedure: COLONOSCOPY WITH PROPOFOL;  Surgeon: Shellia Cleverly, DO;  Location: [facility];  Service: Gastroenterology;  Laterality: N/A;   ELBOW SURGERY Left    ENDOSCOPIC MUCOSAL RESECTION N/A  10/02/2019   Procedure: ENDOSCOPIC MUCOSAL RESECTION;  Surgeon: Shellia Cleverly, DO;  Location: [facility];  Service: Gastroenterology;  Laterality: N/A;   ESOPHAGOGASTRODUODENOSCOPY (EGD) WITH PROPOFOL N/A 07/14/2022   Procedure: ESOPHAGOGASTRODUODENOSCOPY (EGD) WITH PROPOFOL;  Surgeon: Shellia Cleverly, DO;  Location: [facility];  Service: Gastroenterology;  Laterality: N/A;   HEMOSTASIS CLIP PLACEMENT  10/09/2019   Procedure: HEMOSTASIS CLIP PLACEMENT;  Surgeon: Iva Boop, MD;  Location: [facility];  Service: Endoscopy;;   HEMOSTASIS CLIP PLACEMENT  11/11/2021   Procedure: HEMOSTASIS CLIP PLACEMENT;  Surgeon: Shellia Cleverly, DO;  Location: [facility];  Service: Gastroenterology;;   HEMOSTASIS CLIP PLACEMENT  07/14/2022   Procedure: HEMOSTASIS CLIP PLACEMENT;  Surgeon: Shellia Cleverly, DO;  Location: [facility];  Service: Gastroenterology;;   INGUINAL HERNIA REPAIR Right    KNEE SURGERY     OPEN REDUCTION INTERNAL FIXATION (ORIF) DISTAL RADIAL FRACTURE Right 10/29/2015   Procedure: OPEN REDUCTION INTERNAL FIXATION (ORIF) DISTAL RADIAL  FRACTURE;  Surgeon: Betha Loa, MD;  Location: [facility];  Service: Orthopedics;  Laterality: Right;   ORIF ELBOW FRACTURE Right    POLYPECTOMY  04/29/2020   Procedure: POLYPECTOMY;  Surgeon: Shellia Cleverly, DO;  Location: [facility];  Service: Gastroenterology;;   POLYPECTOMY  11/11/2021   Procedure: POLYPECTOMY;  Surgeon: Shellia Cleverly, DO;  Location: [facility];  Service: Gastroenterology;;   POLYPECTOMY  07/14/2022   Procedure: POLYPECTOMY;  Surgeon: Shellia Cleverly, DO;  Location: [facility];  Service: Gastroenterology;;   REVERSE SHOULDER ARTHROPLASTY Left 11/10/2023   Procedure: REVERSE SHOULDER ARTHROPLASTY;  Surgeon: Beverely Low, MD;  Location: [facility];  Service: Orthopedics;  Laterality: Left;  interscalene block, flip room   SAVORY DILATION N/A 07/14/2022   Procedure: SAVORY DILATION;  Surgeon: Shellia Cleverly, DO;  Location: [facility];  Service: Gastroenterology;  Laterality: N/A;   SUBMUCOSAL LIFTING INJECTION  10/02/2019   Procedure: SUBMUCOSAL LIFTING INJECTION;  Surgeon: Shellia Cleverly, DO;  Location: [facility];  Service: Gastroenterology;;   TONSILLECTOMY     TOTAL KNEE ARTHROPLASTY Bilateral    TOTAL KNEE REVISION Right 08/25/2021   Procedure: Right knee polyethylene vs total knee arthroplasty revision;  Surgeon: Ollen Gross, MD;  Location: [facility];  Service: Orthopedics;  Laterality: Right;   ULNAR COLLATERAL LIGAMENT REPAIR Right 12/29/2015   Procedure: REPAIR  RIGHT LATERAL ULNAR COLLATERAL LIGAMENT TEAR  EXTENSOR ORIGIN ;  Surgeon: Betha Loa, MD;  Location: [facility];  Service: Orthopedics;  Laterality: Right;   WRIST ARTHROSCOPY WITH DEBRIDEMENT Right 07/14/2016   Procedure: RIGHT WRIST ARTHROSCOPY WITH DEBRIDEMENT  TRIANGULAR FIBROCARTILAGE COMPLEX;  Surgeon: Betha Loa, MD;  Location: [facility];  Service: Orthopedics;  Laterality: Right;    Family History  Problem Relation Age of  Onset   Alcohol abuse Mother    Ovarian cancer Mother    Alcohol abuse Father    Pancreatic cancer Father    Hyperlipidemia Brother    Barrett's esophagus Brother    Colon cancer Maternal Grandmother    Depression Daughter    Paranoid behavior Daughter    Esophageal cancer Neg Hx    Stomach cancer Neg Hx    Rectal cancer Neg Hx    Colon polyps Neg Hx     Allergies  Allergen Reactions   Penicillins Other (See Comments)    BLISTERS Did it involve swelling of the face/tongue/throat, SOB, or low BP? No Did it involve sudden or severe rash/hives, skin peeling, or any  reaction on the inside of your mouth or nose? No Did you need to seek medical attention at a hospital or doctor's office? No When did it last happen? around 1980    If all above answers are "NO", may proceed with cephalosporin use. Tolerated Cephalosporin Date: 08/26/21.     Lisinopril Diarrhea and Cough    Dry cough, abdominal bloating and diarrhea   Zetia [Ezetimibe]     Myalgia     Current Outpatient Medications on File Prior to Visit  Medication Sig Dispense Refill   pravastatin (PRAVACHOL) 10 MG tablet Take 10 mg by mouth once a week.     albuterol (VENTOLIN HFA) 108 (90 Base) MCG/ACT inhaler Inhale 2 puffs into the lungs every 6 (six) hours as needed for wheezing or shortness of breath. 6.7 g 3   ARIPiprazole ER (ABILIFY MAINTENA) 400 MG PRSY prefilled syringe Inject 400 mg into the muscle every 28 (twenty-eight) days.     azelastine (ASTELIN) 0.1 % nasal spray Place 2 sprays into both nostrils 2 (two) times daily. Use in each nostril as directed 30 mL 6   cetirizine (ZYRTEC) 10 MG tablet TAKE 1 TABLET (10 MG TOTAL) BY MOUTH EVERY MORNING. 90 tablet 0   chlorhexidine (HIBICLENS) 4 % external liquid Apply 15 mLs (1 Application total) topically as directed for 30 doses. Use as directed daily for 5 days every other week for 6 weeks. 946 mL 1   citalopram (CELEXA) 20 MG tablet Take 1 tablet (20 mg total) by mouth  daily. 90 tablet 0   fenofibrate (TRICOR) 145 MG tablet TAKE 1 TABLET BY MOUTH EVERY DAY 90 tablet 0   ipratropium-albuterol (DUONEB) 0.5-2.5 (3) MG/3ML SOLN INHALE 3 ML BY NEBULIZER EVERY 6 HOURS AS NEEDED 360 mL 0   losartan (COZAAR) 50 MG tablet TAKE 1 TABLET BY MOUTH EVERY MORNING 90 tablet 0   metFORMIN (GLUCOPHAGE) 500 MG tablet TAKE 1 TABLET (500 MG TOTAL) BY MOUTH DAILY. 90 tablet 0   methocarbamol (ROBAXIN) 500 MG tablet Take 1 tablet (500 mg total) by mouth every 8 (eight) hours as needed for muscle spasms. 40 tablet 1   Multiple Vitamin (MULTIVITAMIN WITH MINERALS) TABS tablet Take 1 tablet by mouth daily.     Multiple Vitamins-Minerals (PRESERVISION AREDS 2 PO) Take 1 capsule by mouth 2 (two) times daily.     mupirocin ointment (BACTROBAN) 2 % Place 1 Application into the nose 2 (two) times daily for 60 doses. Use as directed 2 times daily for 5 days every other week for 6 weeks. 60 g 0   pantoprazole (PROTONIX) 40 MG tablet TAKE 1 TABLET BY MOUTH 2 (TWO) TIMES DAILY. PLEASE KEEP APPOINTMENT SCHEDULED FOR 12-04-23 AT 840AM 180 tablet 0   temazepam (RESTORIL) 15 MG capsule Take 1 capsule (15 mg total) by mouth at bedtime. 90 capsule 0   VASCEPA 1 g capsule TAKE TWO CAPSULES BY MOUTH TWICE DAILY 360 capsule 3   Current Facility-Administered Medications on File Prior to Visit  Medication Dose Route Frequency Provider Last Rate Last Admin   ARIPiprazole ER (ABILIFY MAINTENA) 400 MG prefilled syringe 400 mg  400 mg Intramuscular Q28 days Plovsky, Earvin Hansen, MD   400 mg at 11/22/23 1329    BP 130/80   Pulse 94   Temp 98.1 F (36.7 C) (Oral)   Ht 5\' 10"  (1.778 m)   Wt 268 lb (121.6 kg)   SpO2 96%   BMI 38.45 kg/m       Objective:  Physical Exam Vitals and nursing note reviewed.  Constitutional:      Appearance: Normal appearance.  Cardiovascular:     Rate and Rhythm: Normal rate and regular rhythm.     Pulses: Normal pulses.     Heart sounds: Normal heart sounds.  Pulmonary:      Effort: Pulmonary effort is normal.     Breath sounds: Wheezing present. No rhonchi or rales.  Musculoskeletal:        General: Normal range of motion.  Skin:    General: Skin is warm and dry.  Neurological:     General: No focal deficit present.     Mental Status: He is alert and oriented to person, place, and time.  Psychiatric:        Mood and Affect: Mood normal.        Behavior: Behavior normal.        Thought Content: Thought content normal.        Judgment: Judgment normal.        Assessment & Plan:  1. Viral URI with cough (Primary) No signs of PNA. Will place him on prednisone taper.  - predniSONE (DELTASONE) 10 MG tablet; 40 mg x 3 days, 20 mg x 3 days, 10 mg x 3 days  Dispense: 21 tablet; Refill: 0 - HYDROcodone bit-homatropine (HYCODAN) 5-1.5 MG/5ML syrup; Take 5 mLs by mouth every 8 (eight) hours as needed for cough.  Dispense: 120 mL; Refill: 0  2. Benign prostatic hyperplasia with lower urinary tract symptoms, symptom details unspecified - Will restart on Flomax and add Proscar.  - If no improvement over the next few months then will refer to urology  - finasteride (PROSCAR) 5 MG tablet; Take 1 tablet (5 mg total) by mouth daily.  Dispense: 90 tablet; Refill: 1 - tamsulosin (FLOMAX) 0.4 MG CAPS capsule; Take 1 capsule (0.4 mg total) by mouth daily.  Dispense: 90 capsule; Refill: 1  Shirline Frees, NP  Time spent with patient today was 31 minutes which consisted of chart review, discussing cough and BPH, work up, treatment answering questions and documentation.

## 2023-11-22 NOTE — Progress Notes (Signed)
 Patient arrived today for his due injection of Abilify Maintena 400 mg. Patient was with his wife today, she is driving because of his shoulder surgery. Patient denies any SI/HI or AVH. Injection was prepared as ordered and administered in patients LUOQ. Patient tolerated well and without complaint. He will return for his next injection in 28 days    NDC: 59148-072-92 LOT: [id number] EXP: APR 2027

## 2023-11-23 DIAGNOSIS — Z4789 Encounter for other orthopedic aftercare: Secondary | ICD-10-CM | POA: Diagnosis not present

## 2023-12-04 ENCOUNTER — Encounter: Payer: Self-pay | Admitting: Gastroenterology

## 2023-12-04 ENCOUNTER — Ambulatory Visit: Payer: PPO | Admitting: Gastroenterology

## 2023-12-04 VITALS — BP 118/78 | HR 65 | Ht 69.0 in | Wt 262.0 lb

## 2023-12-04 DIAGNOSIS — Z860100 Personal history of colon polyps, unspecified: Secondary | ICD-10-CM | POA: Diagnosis not present

## 2023-12-04 DIAGNOSIS — K219 Gastro-esophageal reflux disease without esophagitis: Secondary | ICD-10-CM

## 2023-12-04 DIAGNOSIS — R1319 Other dysphagia: Secondary | ICD-10-CM

## 2023-12-04 DIAGNOSIS — R1310 Dysphagia, unspecified: Secondary | ICD-10-CM | POA: Diagnosis not present

## 2023-12-04 DIAGNOSIS — Z760 Encounter for issue of repeat prescription: Secondary | ICD-10-CM

## 2023-12-04 MED ORDER — PANTOPRAZOLE SODIUM 40 MG PO TBEC: 40.0000 mg | DELAYED_RELEASE_TABLET | Freq: Two times a day (BID) | ORAL | 3 refills | Status: DC

## 2023-12-04 NOTE — Progress Notes (Signed)
 Chief Complaint:    Medication refill, GERD  GI History: 83 year old male with history of anxiety, arthritis, asthma, bipolar disorder, depression, difficult airway for intubation, GERD, history of colon polyps, nephrolithiasis, HTN, HLD, LBBB, macular degeneration.   Endoscopic history: - Positive Cologuard in 2016 -Colonoscopy (08/2019, Dr. Barron Alvine): 12 tubular adenomas, 3-11 mm, 25-30 mm tubulovillous adenoma in ascending colon (biopsied), diverticulosis, internal hemorrhoids, normal TI -EGD (08/2019, Dr. Barron Alvine): 3 cm HH, irregular Z-line (biopsy: Reflux changes without intestinal metaplasia), Gastric hyperplastic polyps.  Empiric dilation with 51 Fr Savary -CT abdomen/pelvis (09/30/2019): No discrete mass.  No lymphadenopathy. -Colonoscopy (10/02/2019, Dr. Barron Alvine): EMR of 25 mm polyp in ascending colon (TVA), left-sided diverticulosis, internal hemorrhoids -Admitted January 19-21, 2021 with post polypectomy bleed.  Hemodynamically stable, stable hemoglobin.  No blood products needed -Colonoscopy (10/09/2019, Dr. Leone Payor): 20 mm ulcer in proximal ascending colon without active bleeding but positive stigmata.  Treated with 11 clips.  Difficult positioning and significant respiratory-dependent motion in colon. - Colonoscopy (04/29/2020): Tattoo in ascending colon.  Single retained clip in proximal ascending colon 1 cm away from post polypectomy scar.  Underlying tissue with polypoid appearance (path: Inflammatory tissue).  Subtle nodularity around post polypectomy site (path:).  2 mm ascending colon adenoma, 3 mm sigmoid adenoma.  Sigmoid diverticulosis, internal hemorrhoids.  Recommended repeat in 1 year - Colonoscopy (11/11/2021): 5 mm transverse colon polyp (path: Tubular adenoma), Diverticulosis, tattoo and post polypectomy scar in ascending colon.  Repeat 3 years - EGD (07/14/2022): 3 cm HH, normal esophagus empirically dilated with 18 mm Savary dilator, irregular Z-line at 39 cm (path  benign), benign fundic gland and gastric hyperplastic polyps.  If continued/recurrent symptoms, plan for MBS and esophageal manometry  HPI:     Patient is a 83 y.o. male presenting to the [facility] for follow-up.  Was last seen in the office on 04/18/2022.  Main issue at that time was dysphagia which has since significantly improved with EGD with dilation on 07/14/2022.  MBS on 06/13/2023: No aspiration, penetration, significant residuals.  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  Barium tablet otherwise passed without delay.  No further ST recommended.  Reflux otherwise well-controlled with Protonix 40 mg twice daily.  Requesting refill today.  Otherwise feeling well and no acute issues.  Left shoulder replacement 3 weeks ago.   Reviewed labs from 10/2023: Normal CBC, BMP.  Review of systems:     No chest pain, no SOB, no fevers, no urinary sx   Past Medical History:  Diagnosis Date   Anal fissure    Anxiety    Arthritis    right wrist; pt. states "everywhere"   Asthma    Bipolar disorder (HCC)    Cataract    Depression    Difficult airway for intubation    GERD (gastroesophageal reflux disease)    Hemorrhage of colon following colonoscopy 10/08/2019   History of kidney stones    History of MRSA infection    left knee after arthroplasty   Hyperlipidemia    Hypertension    states under control with med., has been on med. x 1 yr.   Impaired hearing    Left bundle branch block (LBBB) on electrocardiogram 10/29/2015   Macular degeneration (senile) of retina    left   Pre-diabetes     Patient's surgical history, family medical history, social history, medications and allergies were all reviewed in Epic    Current Outpatient Medications  Medication Sig Dispense Refill   albuterol (VENTOLIN HFA) 108 (90 Base) MCG/ACT inhaler Inhale 2 puffs into the lungs every 6 (six)  hours as needed for wheezing or shortness of breath. 6.7 g 3   ARIPiprazole ER (ABILIFY MAINTENA) 400 MG PRSY prefilled syringe Inject 400 mg into the muscle every 28 (twenty-eight) days.     azelastine (ASTELIN) 0.1 % nasal spray Place 2 sprays into both nostrils 2 (two) times daily. Use in each nostril as directed 30 mL 6   cetirizine (ZYRTEC) 10 MG tablet TAKE 1 TABLET (10 MG TOTAL) BY MOUTH EVERY MORNING. 90 tablet 0   chlorhexidine (HIBICLENS) 4 % external liquid Apply 15 mLs (1 Application total) topically as directed for 30 doses. Use as directed daily for 5 days every other week for 6 weeks. 946 mL 1   citalopram (CELEXA) 20 MG tablet Take 1 tablet (20 mg total) by mouth daily. 90 tablet 0   fenofibrate (TRICOR) 145 MG tablet TAKE 1 TABLET BY MOUTH EVERY DAY 90 tablet 0   finasteride (PROSCAR) 5 MG tablet Take 1 tablet (5 mg total) by mouth daily. 90 tablet 1   HYDROcodone bit-homatropine (HYCODAN) 5-1.5 MG/5ML syrup Take 5 mLs by mouth every 8 (eight) hours as needed for cough. 120 mL 0   ipratropium-albuterol (DUONEB) 0.5-2.5 (3) MG/3ML SOLN INHALE 3 ML BY NEBULIZER EVERY 6 HOURS AS NEEDED 360 mL 0   losartan (COZAAR) 50 MG tablet TAKE 1 TABLET BY MOUTH EVERY MORNING 90 tablet 0   metFORMIN (GLUCOPHAGE) 500 MG tablet TAKE 1 TABLET (500 MG TOTAL) BY MOUTH DAILY. 90 tablet 0   methocarbamol (ROBAXIN) 500 MG tablet Take 1 tablet (500 mg total) by mouth every 8 (eight) hours as needed for muscle spasms. 40 tablet 1   Multiple Vitamin (MULTIVITAMIN WITH MINERALS) TABS tablet Take 1 tablet by mouth daily.     Multiple Vitamins-Minerals (PRESERVISION AREDS 2 PO) Take 1 capsule by mouth 2 (two) times daily.     mupirocin ointment (BACTROBAN) 2 % Place 1 Application into the nose 2 (two) times daily for 60 doses. Use as directed 2 times daily for 5 days every other week for 6 weeks. 60 g 0   pantoprazole (PROTONIX) 40 MG tablet TAKE 1 TABLET BY MOUTH 2 (TWO) TIMES DAILY. PLEASE KEEP APPOINTMENT  SCHEDULED FOR 12-04-23 AT 840AM 180 tablet 0   pravastatin (PRAVACHOL) 10 MG tablet Take 10 mg by mouth once a week.     predniSONE (DELTASONE) 10 MG tablet 40 mg x 3 days, 20 mg x 3 days, 10 mg x 3 days 21 tablet 0   tamsulosin (FLOMAX) 0.4 MG CAPS capsule Take 1 capsule (0.4 mg total) by mouth daily. 90 capsule 1   temazepam (RESTORIL) 15 MG capsule Take 1 capsule (15 mg total) by mouth at bedtime. 90 capsule 0   VASCEPA 1 g capsule TAKE TWO CAPSULES BY MOUTH TWICE DAILY 360 capsule 3   Current Facility-Administered Medications  Medication Dose Route Frequency Provider Last Rate Last Admin   ARIPiprazole ER (ABILIFY MAINTENA) 400 MG prefilled syringe 400 mg  400 mg Intramuscular Q28 days Archer Asa, MD   400 mg at 11/22/23 1329    Physical Exam:     Ht 5\' 9"  (1.753 m)   Wt 262 lb (118.8 kg)   BMI 38.69 kg/m   GENERAL:  Pleasant male in NAD PSYCH: : Cooperative, normal affect CARDIAC:  RRR, no murmur heard, no peripheral edema PULM: Normal respiratory effort,  lungs CTA bilaterally, no wheezing ABDOMEN:  Nondistended, soft, nontender. No obvious masses, no hepatomegaly,  normal bowel sounds SKIN:  turgor, no lesions seen Musculoskeletal:  Normal muscle tone, normal strength NEURO: Alert and oriented x 3, no focal neurologic deficits   IMPRESSION and PLAN:    1) GERD - Refill for Protonix 40 mg twice daily, 90-day supply, RF 5 - Continue current therapy - Continue antireflux lifestyle/dietary modification  2) Dysphagia Good response to EGD with empiric dilation.  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  No current dysphagia. - If return of dysphagia symptoms, will plan on repeat EGD with empiric dilation - Continue cutting food into small pieces, chewing thoroughly, and drinking plenty of fluids with meals     3) History of colon polyps - Repeat colonoscopy in 10/2024 for ongoing polyp surveillance    4) History of difficult intubation Due to history of difficult airway and Mallampati 4, any endoscopic procedures need to be scheduled at [facility] unit.       Shellia Cleverly ,DO, FACG 12/04/2023, 8:48 AM

## 2023-12-04 NOTE — Patient Instructions (Addendum)
 We have sent the following medications to your pharmacy for you to pick up at your convenience: Pantoprazole 40mg   _______________________________________________________  If your blood pressure at your visit was 140/90 or greater, please contact your primary care physician to follow up on this.  _______________________________________________________  If you are age 81 or older, your body mass index should be between 23-30. Your Body mass index is 38.69 kg/m. If this is out of the aforementioned range listed, please consider follow up with your Primary Care Provider.  If you are age 83 or younger, your body mass index should be between 19-25. Your Body mass index is 38.69 kg/m. If this is out of the aformentioned range listed, please consider follow up with your Primary Care Provider.   ________________________________________________________  The [facility] providers would like to encourage you to use [facility] to communicate with providers for non-urgent requests or questions.  Due to long hold times on the telephone, sending your provider a message by [facility] may be a faster and more efficient way to get a response.  Please allow 48 business hours for a response.  Please remember that this is for non-urgent requests.  _______________________________________________________  Thank you for entrusting me with your care and choosing [facility].  It was a pleasure to see you today!  Doristine Locks, D.O.   Dr Barron Alvine

## 2023-12-05 DIAGNOSIS — H43813 Vitreous degeneration, bilateral: Secondary | ICD-10-CM | POA: Diagnosis not present

## 2023-12-05 DIAGNOSIS — H353221 Exudative age-related macular degeneration, left eye, with active choroidal neovascularization: Secondary | ICD-10-CM | POA: Diagnosis not present

## 2023-12-05 DIAGNOSIS — H353212 Exudative age-related macular degeneration, right eye, with inactive choroidal neovascularization: Secondary | ICD-10-CM | POA: Diagnosis not present

## 2023-12-20 ENCOUNTER — Ambulatory Visit (HOSPITAL_BASED_OUTPATIENT_CLINIC_OR_DEPARTMENT_OTHER)

## 2023-12-20 VITALS — BP 137/72 | HR 75 | Resp 20 | Ht 68.0 in | Wt 262.8 lb

## 2023-12-20 DIAGNOSIS — F319 Bipolar disorder, unspecified: Secondary | ICD-10-CM

## 2023-12-20 NOTE — Patient Instructions (Signed)
 Pt presents today for due Abilify Maintena 400 mg injection. Pt is appropriate and cooperative on approach. Affect appropriate to mood. Pt has no complaints of pain from recent shoulder surgery. Pt denies any mood lability, SI, HI, or AVH. Injection was prepared as ordered and administered in RUOQ without c/o. Pt to return in approximately 28 days for next due injection.

## 2023-12-21 DIAGNOSIS — Z4789 Encounter for other orthopedic aftercare: Secondary | ICD-10-CM | POA: Diagnosis not present

## 2023-12-26 ENCOUNTER — Other Ambulatory Visit: Payer: Self-pay | Admitting: Adult Health

## 2023-12-28 ENCOUNTER — Telehealth: Payer: Self-pay

## 2023-12-28 NOTE — Telephone Encounter (Signed)
 Copied from CRM [id number]. Topic: Clinical - Medication Question >> Dec 28, 2023  9:02 AM Adaysia C wrote: Reason for CRM: Patients wife(Sherry) has requested a call back from Marylene Land to ask questions about the patients medication:VASCEPA 1 g capsule; please follow up with patients wife Cordelia Pen [phone] when available

## 2023-12-29 NOTE — Telephone Encounter (Signed)
 Contacted patient. Vascepa copay was very expensive even with insurance coverage. Was able to get patient setup for [facility] coverage card to cover the copay. Contacted pharmacy to provide copay card info. Card info placed below for future reference.

## 2024-01-03 DIAGNOSIS — H353221 Exudative age-related macular degeneration, left eye, with active choroidal neovascularization: Secondary | ICD-10-CM | POA: Diagnosis not present

## 2024-01-03 DIAGNOSIS — H353212 Exudative age-related macular degeneration, right eye, with inactive choroidal neovascularization: Secondary | ICD-10-CM | POA: Diagnosis not present

## 2024-01-03 DIAGNOSIS — H43813 Vitreous degeneration, bilateral: Secondary | ICD-10-CM | POA: Diagnosis not present

## 2024-01-16 ENCOUNTER — Ambulatory Visit (HOSPITAL_BASED_OUTPATIENT_CLINIC_OR_DEPARTMENT_OTHER)

## 2024-01-16 VITALS — BP 126/74 | HR 75 | Ht 71.0 in | Wt 266.0 lb

## 2024-01-16 DIAGNOSIS — F319 Bipolar disorder, unspecified: Secondary | ICD-10-CM

## 2024-01-16 NOTE — Progress Notes (Signed)
 Patient arrived today for his due injection of Abilify  Maintena 400 mg. Patient was well groomed with an appropriate affect. Patient denies SI/HI or AVH. Patient is planning a trip to the beach in July, he has no other big plans for the summer. Injection was prepared as ordered and administered in patients RUOQ. Patient tolerated well and without complaint. He will return in 28 days for his next due injection.    NDC: 40981-191-47 LOT: [id number] EXP: AUG 2027

## 2024-01-18 ENCOUNTER — Telehealth (HOSPITAL_COMMUNITY): Payer: PPO | Admitting: Psychiatry

## 2024-01-18 DIAGNOSIS — M5416 Radiculopathy, lumbar region: Secondary | ICD-10-CM | POA: Diagnosis not present

## 2024-01-30 ENCOUNTER — Encounter (HOSPITAL_COMMUNITY): Payer: Self-pay | Admitting: Psychiatry

## 2024-01-30 ENCOUNTER — Telehealth (HOSPITAL_BASED_OUTPATIENT_CLINIC_OR_DEPARTMENT_OTHER): Admitting: Psychiatry

## 2024-01-30 DIAGNOSIS — F5101 Primary insomnia: Secondary | ICD-10-CM

## 2024-01-30 DIAGNOSIS — F319 Bipolar disorder, unspecified: Secondary | ICD-10-CM | POA: Diagnosis not present

## 2024-01-30 DIAGNOSIS — F411 Generalized anxiety disorder: Secondary | ICD-10-CM

## 2024-01-30 MED ORDER — CITALOPRAM HYDROBROMIDE 20 MG PO TABS: 20.0000 mg | ORAL_TABLET | Freq: Every day | ORAL | 0 refills | Status: DC

## 2024-01-30 MED ORDER — TEMAZEPAM 15 MG PO CAPS
15.0000 mg | ORAL_CAPSULE | Freq: Every day | ORAL | 0 refills | Status: DC
Start: 2024-01-30 — End: 2024-05-01

## 2024-01-30 MED ORDER — ARIPIPRAZOLE ER 400 MG IM PRSY: 400.0000 mg | PREFILLED_SYRINGE | INTRAMUSCULAR | Status: DC

## 2024-01-30 NOTE — Progress Notes (Signed)
 [facility] MD Virtual Progress Note   Patient Location: Home Provider Location: Home Office  I connect with patient by telephone and verified that I am speaking with correct person by using two identifiers. I discussed the limitations of evaluation and management by telemedicine and the availability of in person appointments. I also discussed with the patient that there may be a patient responsible charge related to this service. The patient expressed understanding and agreed to proceed.  Sean Hill [id number] 83 y.o.  01/30/2024 9:27 AM  History of Present Illness:  Patient is evaluated by phone session.  Patient cannot do video because he is not very familiar with technology but promised to come in person on his next visit.  He reported things are going well.  He recently had a shoulder surgery and it went well and his pain is much better.  He recently finished physical therapy sessions.  He used to walk but not lately since he was busy in physical therapy.  His wife takes him to the doctor's appointment.  He reports hallucination, paranoia is much better with Abilify .  He gets Abilify  injection every 4 weeks.  He is sleeping better with the temazepam .  He is also compliant with Celexa .  He has no tremors, shakes or any EPS.  He used to have constipation and dry mouth however since stopped the gabapentin , hydroxyzine  is symptoms are much better.  Denies any mania, agitation, aggression, excessive spending or mood lability.  He does not drive.  He denies drinking or using any illegal substances.  He like to keep his current medication.  He reported lately having back pain and not sure if he required back surgery because it is too old to go through the surgery again.  However so far pain is manageable.  Past Psychiatric History: H/O multiple hospitalization.  Last inpatient in July 2014. H/O overdose on Latuda  with alcohol. H/O cutting wrist.  Took Tegretol , Depakote , Ambien,  Remeron , Vistaril , Geodon , Abilify , lithium , Provigil, Zoloft, Neurontin , Lamictal , Wellbutrin , Risperdal  and Pristiq. H/O mania, aggression, getting speeding tickets, excessive buying and impulsive behavior    Outpatient Encounter Medications as of 01/30/2024  Medication Sig   albuterol  (VENTOLIN  HFA) 108 (90 Base) MCG/ACT inhaler Inhale 2 puffs into the lungs every 6 (six) hours as needed for wheezing or shortness of breath.   ARIPiprazole  ER (ABILIFY  MAINTENA) 400 MG PRSY prefilled syringe Inject 400 mg into the muscle every 28 (twenty-eight) days.   azelastine  (ASTELIN ) 0.1 % nasal spray Place 2 sprays into both nostrils 2 (two) times daily. Use in each nostril as directed   cetirizine  (ZYRTEC ) 10 MG tablet TAKE 1 TABLET (10 MG TOTAL) BY MOUTH EVERY MORNING.   chlorhexidine  (HIBICLENS ) 4 % external liquid Apply 15 mLs (1 Application total) topically as directed for 30 doses. Use as directed daily for 5 days every other week for 6 weeks.   citalopram  (CELEXA ) 20 MG tablet Take 1 tablet (20 mg total) by mouth daily.   fenofibrate  (TRICOR ) 145 MG tablet TAKE 1 TABLET BY MOUTH EVERY DAY   finasteride  (PROSCAR ) 5 MG tablet Take 1 tablet (5 mg total) by mouth daily.   HYDROcodone  bit-homatropine (HYCODAN) 5-1.5 MG/5ML syrup Take 5 mLs by mouth every 8 (eight) hours as needed for cough.   ipratropium-albuterol  (DUONEB) 0.5-2.5 (3) MG/3ML SOLN INHALE 3 ML BY NEBULIZER EVERY 6 HOURS AS NEEDED   losartan  (COZAAR ) 50 MG tablet TAKE 1 TABLET BY MOUTH EVERY MORNING   metFORMIN  (GLUCOPHAGE ) 500 MG  tablet TAKE 1 TABLET (500 MG TOTAL) BY MOUTH DAILY.   methocarbamol  (ROBAXIN ) 500 MG tablet Take 1 tablet (500 mg total) by mouth every 8 (eight) hours as needed for muscle spasms.   Multiple Vitamin (MULTIVITAMIN WITH MINERALS) TABS tablet Take 1 tablet by mouth daily.   Multiple Vitamins-Minerals (PRESERVISION AREDS 2 PO) Take 1 capsule by mouth 2 (two) times daily.   pantoprazole  (PROTONIX ) 40 MG tablet Take 1  tablet (40 mg total) by mouth 2 (two) times daily.   pravastatin  (PRAVACHOL ) 10 MG tablet Take 10 mg by mouth once a week.   predniSONE  (DELTASONE ) 10 MG tablet 40 mg x 3 days, 20 mg x 3 days, 10 mg x 3 days   tamsulosin  (FLOMAX ) 0.4 MG CAPS capsule Take 1 capsule (0.4 mg total) by mouth daily.   temazepam  (RESTORIL ) 15 MG capsule Take 1 capsule (15 mg total) by mouth at bedtime.   VASCEPA  1 g capsule TAKE 2 CAPSULES BY MOUTH TWICE DAILY   Facility-Administered Encounter Medications as of 01/30/2024  Medication   ARIPiprazole  ER (ABILIFY  MAINTENA) 400 MG prefilled syringe 400 mg    Recent Results (from the past 2160 hours)  POC Urinalysis Dipstick     Status: Abnormal   Collection Time: 11/03/23  8:16 AM  Result Value Ref Range   Color, UA yellow    Clarity, UA cloudy    Glucose, UA Negative Negative   Bilirubin, UA neg    Ketones, UA neg    Spec Grav, UA 1.020 1.010 - 1.025   Blood, UA neg    pH, UA 6.0 5.0 - 8.0   Protein, UA Positive (A) Negative   Urobilinogen, UA 0.2 0.2 or 1.0 E.U./dL   Nitrite, UA neg    Leukocytes, UA Moderate (2+) (A) Negative   Appearance     Odor    Culture, Urine     Status: None   Collection Time: 11/03/23 10:45 AM   Specimen: Urine  Result Value Ref Range   MICRO NUMBER: [id number]    SPECIMEN QUALITY: Adequate    Sample Source URINE    STATUS: FINAL    Result: No Growth   Glucose, capillary     Status: Abnormal   Collection Time: 11/10/23 12:07 PM  Result Value Ref Range   Glucose-Capillary 105 (H) 70 - 99 mg/dL    Comment: Glucose reference range applies only to samples taken after fasting for at least 8 hours.   Comment 1 Notify RN    Comment 2 Document in Chart   POC COVID-19     Status: None   Collection Time: 11/14/23  9:23 AM  Result Value Ref Range   SARS Coronavirus 2 Ag Negative Negative  POC Influenza A/B     Status: None   Collection Time: 11/14/23  9:24 AM  Result Value Ref Range   Influenza A, POC Negative Negative    Influenza B, POC Negative Negative     Psychiatric Specialty Exam: Physical Exam  Review of Systems  Musculoskeletal:  Positive for back pain.    Weight 266 lb (120.7 kg).There is no height or weight on file to calculate BMI.  General Appearance: NA  Eye Contact:  NA  Speech:  Slow  Volume:  Decreased  Mood:  NA  Affect:  NA  Thought Process:  Goal Directed  Orientation:  Full (Time, Place, and Person)  Thought Content:  Logical  Suicidal Thoughts:  No  Homicidal Thoughts:  No  Memory:  Immediate;  Fair Recent;   Fair Remote;   Fair  Judgement:  Intact  Insight:  Present  Psychomotor Activity:  Decreased  Concentration:  Concentration: Fair and Attention Span: Fair  Recall:  [employer] of Knowledge:  Fair  Language:  Good  Akathisia:  No  Handed:  Right  AIMS (if indicated):     Assets:  Communication Skills Desire for Improvement Housing Social Support  ADL's:  Intact  Cognition:  WNL  Sleep:  ok       01/17/2023   11:18 AM 01/03/2023    8:40 AM 09/26/2022    2:12 PM 07/29/2022    1:44 PM 05/05/2022    9:14 AM  Depression screen PHQ 2/9  Decreased Interest 2 2 0 0 0  Down, Depressed, Hopeless 1 1 0 0 0  PHQ - 2 Score 3 3 0 0 0  Altered sleeping 3 0   3  Tired, decreased energy 1 3   3   Change in appetite 1 2   3   Feeling bad or failure about yourself  0 0   0  Trouble concentrating 1 0   0  Moving slowly or fidgety/restless 1 0   0  Suicidal thoughts 0 0   0  PHQ-9 Score 10 8   9   Difficult doing work/chores Somewhat difficult Not difficult at all   Very difficult    Assessment/Plan: Bipolar I disorder (HCC) - Plan: ARIPiprazole  ER (ABILIFY  MAINTENA) 400 MG PRSY prefilled syringe, temazepam  (RESTORIL ) 15 MG capsule  GAD (generalized anxiety disorder) - Plan: citalopram  (CELEXA ) 20 MG tablet, temazepam  (RESTORIL ) 15 MG capsule  Primary insomnia - Plan: temazepam  (RESTORIL ) 15 MG capsule  Review collateral information from other provider.  Patient  stable on current medication.  He is no longer taking gabapentin  and that helps his attention and concentration.  Elect to continue Celexa , Abilify  and temazepam .  Discussed medication side effects and benefits.  He admitted some concern about back pain as doctor suggested back surgery but he is not too sure about it.  Continue temazepam  15 mg at bedtime, Celexa  20 mg daily, Abilify  400 mg intramuscular every 28 days.  Recommend to call us  back if there is any question or any concern.  Follow-up in 3 months.   Follow Up Instructions:     I discussed the assessment and treatment plan with the patient. The patient was provided an opportunity to ask questions and all were answered. The patient agreed with the plan and demonstrated an understanding of the instructions.   The patient was advised to call back or seek an in-person evaluation if the symptoms worsen or if the condition fails to improve as anticipated.    Collaboration of Care: Other provider involved in patient's care AEB notes are available in epic to review  Patient/Guardian was advised Release of Information must be obtained prior to any record release in order to collaborate their care with an outside provider. Patient/Guardian was advised if they have not already done so to contact the registration department to sign all necessary forms in order for us  to release information regarding their care.   Consent: Patient/Guardian gives verbal consent for treatment and assignment of benefits for services provided during this visit. Patient/Guardian expressed understanding and agreed to proceed.     Total encounter time 18 minutes which includes face-to-face time, chart reviewed, care coordination, order entry and documentation during this encounter.   Note: This document was prepared by [employer] and any  errors that results from this process are unintentional.    Arturo Late, MD 01/30/2024

## 2024-01-31 DIAGNOSIS — Z4789 Encounter for other orthopedic aftercare: Secondary | ICD-10-CM | POA: Diagnosis not present

## 2024-02-05 ENCOUNTER — Other Ambulatory Visit: Payer: Self-pay | Admitting: Adult Health

## 2024-02-05 ENCOUNTER — Other Ambulatory Visit (HOSPITAL_COMMUNITY): Payer: Self-pay | Admitting: Psychiatry

## 2024-02-05 DIAGNOSIS — F411 Generalized anxiety disorder: Secondary | ICD-10-CM

## 2024-02-05 DIAGNOSIS — I10 Essential (primary) hypertension: Secondary | ICD-10-CM

## 2024-02-09 DIAGNOSIS — H43813 Vitreous degeneration, bilateral: Secondary | ICD-10-CM | POA: Diagnosis not present

## 2024-02-09 DIAGNOSIS — H353221 Exudative age-related macular degeneration, left eye, with active choroidal neovascularization: Secondary | ICD-10-CM | POA: Diagnosis not present

## 2024-02-09 DIAGNOSIS — H353212 Exudative age-related macular degeneration, right eye, with inactive choroidal neovascularization: Secondary | ICD-10-CM | POA: Diagnosis not present

## 2024-02-21 ENCOUNTER — Ambulatory Visit (HOSPITAL_BASED_OUTPATIENT_CLINIC_OR_DEPARTMENT_OTHER)

## 2024-02-21 VITALS — BP 122/71 | HR 80 | Ht 70.0 in | Wt 269.0 lb

## 2024-02-21 DIAGNOSIS — F319 Bipolar disorder, unspecified: Secondary | ICD-10-CM

## 2024-02-21 NOTE — Progress Notes (Signed)
 Patient arrives today for his due injection of Abilify  Maintenna 400 mg. Patient presents well groomed with an appropriate affect. Patient denies any SI/HI or AVS. The injection was prepared as ordered and administered in patients LUOQ. Patient tolerated well and without complaint. Patient will return in 28 days.    NDC: 78295-621-30 LOT: [id number] EXP: SEP 2027

## 2024-03-06 ENCOUNTER — Other Ambulatory Visit: Payer: PPO

## 2024-03-19 ENCOUNTER — Ambulatory Visit (HOSPITAL_COMMUNITY): Admitting: *Deleted

## 2024-03-19 VITALS — BP 124/67 | HR 71 | Ht 69.5 in | Wt 270.6 lb

## 2024-03-19 DIAGNOSIS — F319 Bipolar disorder, unspecified: Secondary | ICD-10-CM | POA: Diagnosis not present

## 2024-03-19 NOTE — Patient Instructions (Signed)
 Pt in clinic today for due Abilify  Maintena 400 mg injection. Pt is appropriate, pleasant and cooperative on approach. Baseline anxious mood and affect. Affect/mood brightens appropriately. Pt c/o pain and swelling in his left arm r/t he says recent shoulder surgery. Pt also c/o chronic back pain. Pt denies any mood lability or any mood related issues. Injection was prepared as ordered an administered in pt's RUOQ without complaint. Pt to return in approximately 28 days for next due injection.

## 2024-03-19 NOTE — Progress Notes (Signed)
 270.6lb

## 2024-04-01 ENCOUNTER — Ambulatory Visit: Payer: Self-pay | Admitting: *Deleted

## 2024-04-01 ENCOUNTER — Emergency Department (HOSPITAL_BASED_OUTPATIENT_CLINIC_OR_DEPARTMENT_OTHER)

## 2024-04-01 ENCOUNTER — Encounter (HOSPITAL_BASED_OUTPATIENT_CLINIC_OR_DEPARTMENT_OTHER): Payer: Self-pay | Admitting: *Deleted

## 2024-04-01 ENCOUNTER — Emergency Department (HOSPITAL_BASED_OUTPATIENT_CLINIC_OR_DEPARTMENT_OTHER): Admitting: Radiology

## 2024-04-01 ENCOUNTER — Other Ambulatory Visit: Payer: Self-pay

## 2024-04-01 ENCOUNTER — Emergency Department (HOSPITAL_BASED_OUTPATIENT_CLINIC_OR_DEPARTMENT_OTHER): Admission: EM | Admit: 2024-04-01 | Discharge: 2024-04-01 | Disposition: A | Discharge status: Home / Self Care

## 2024-04-01 DIAGNOSIS — D72819 Decreased white blood cell count, unspecified: Secondary | ICD-10-CM | POA: Insufficient documentation

## 2024-04-01 DIAGNOSIS — R0789 Other chest pain: Secondary | ICD-10-CM | POA: Diagnosis not present

## 2024-04-01 DIAGNOSIS — Z79899 Other long term (current) drug therapy: Secondary | ICD-10-CM | POA: Insufficient documentation

## 2024-04-01 DIAGNOSIS — R918 Other nonspecific abnormal finding of lung field: Secondary | ICD-10-CM | POA: Diagnosis not present

## 2024-04-01 DIAGNOSIS — R600 Localized edema: Secondary | ICD-10-CM | POA: Diagnosis not present

## 2024-04-01 DIAGNOSIS — K8020 Calculus of gallbladder without cholecystitis without obstruction: Secondary | ICD-10-CM | POA: Diagnosis not present

## 2024-04-01 DIAGNOSIS — J984 Other disorders of lung: Secondary | ICD-10-CM | POA: Diagnosis not present

## 2024-04-01 DIAGNOSIS — I10 Essential (primary) hypertension: Secondary | ICD-10-CM | POA: Insufficient documentation

## 2024-04-01 DIAGNOSIS — J45909 Unspecified asthma, uncomplicated: Secondary | ICD-10-CM | POA: Insufficient documentation

## 2024-04-01 DIAGNOSIS — R0602 Shortness of breath: Secondary | ICD-10-CM | POA: Insufficient documentation

## 2024-04-01 DIAGNOSIS — Z96612 Presence of left artificial shoulder joint: Secondary | ICD-10-CM | POA: Diagnosis not present

## 2024-04-01 DIAGNOSIS — M79602 Pain in left arm: Secondary | ICD-10-CM | POA: Diagnosis not present

## 2024-04-01 LAB — CBC
HCT: 38.1 % — ABNORMAL LOW (ref 39.0–52.0)
Hemoglobin: 13.1 g/dL (ref 13.0–17.0)
MCH: 30 pg (ref 26.0–34.0)
MCHC: 34.4 g/dL (ref 30.0–36.0)
MCV: 87.2 fL (ref 80.0–100.0)
Platelets: 224 K/uL (ref 150–400)
RBC: 4.37 MIL/uL (ref 4.22–5.81)
RDW: 15.2 % (ref 11.5–15.5)
WBC: 3.8 K/uL — ABNORMAL LOW (ref 4.0–10.5)
nRBC: 0 % (ref 0.0–0.2)

## 2024-04-01 LAB — PRO BRAIN NATRIURETIC PEPTIDE: Pro Brain Natriuretic Peptide: 206 pg/mL (ref <300.0)

## 2024-04-01 LAB — BASIC METABOLIC PANEL WITH GFR
Anion gap: 13 (ref 5–15)
BUN: 11 mg/dL (ref 8–23)
CO2: 22 mmol/L (ref 22–32)
Calcium: 9.5 mg/dL (ref 8.9–10.3)
Chloride: 105 mmol/L (ref 98–111)
Creatinine, Ser: 1.11 mg/dL (ref 0.61–1.24)
GFR, Estimated: >60 mL/min (ref >60)
Glucose, Bld: 105 mg/dL — ABNORMAL HIGH (ref 70–99)
Potassium: 4.2 mmol/L (ref 3.5–5.1)
Sodium: 141 mmol/L (ref 135–145)

## 2024-04-01 LAB — TROPONIN T, HIGH SENSITIVITY
Troponin T High Sensitivity: <15 ng/L (ref <19)
Troponin T High Sensitivity: <15 ng/L (ref <19)

## 2024-04-01 MED ORDER — IOHEXOL 350 MG/ML SOLN
100.0000 mL | Freq: Once | INTRAVENOUS | Status: AC | PRN
  Administered 2024-04-01: 75 mL via INTRAVENOUS

## 2024-04-01 NOTE — Discharge Instructions (Addendum)
 Your CT scan showed a concerning density in the left lower lobe which has grown since previous CT scan in 2023.  It was not clear on the scan, it could represent some scarring, but there is also some suspicion of a possible malignancy.  The radiologist recommend that you get a PET scan.  I have placed ambulatory referral to the oncologist.  Your workup today was otherwise reassuring, there was no signs of developing heart failure, heart attack, pneumonia, or other causes to explain your shortness of breath.

## 2024-04-01 NOTE — ED Notes (Signed)
 DC paperwork given and verbally understood.

## 2024-04-01 NOTE — ED Triage Notes (Signed)
 Patient to ED reporting chest tightness and shortness of breath worsening over 3 days with cough and bilateral leg swelling. No CHF hx. LBB and RBB hx.

## 2024-04-01 NOTE — ED Provider Notes (Signed)
 [place] EMERGENCY DEPARTMENT AT [facility] Provider Note   CSN: [id number] Arrival date & time: 04/01/24  1357     Patient presents with: Chest Pain and Shortness of Breath   Sean Hill is a 83 y.o. male with past medical history significant hyperlipidemia, left bundle branch block, GERD, bipolar disorder, obesity presents concern for chest tightness, shortness of breath, bilateral leg and left upper arm swelling.  Reports surgery on left upper arm somewhat recently.  Reports he has been mostly worried about the shortness of breath.  No previous history of blood clots.  Denies any cough, fever, chills.  No history of tobacco abuse, previous stroke.    Chest Pain Associated symptoms: shortness of breath   Shortness of Breath Associated symptoms: chest pain        Prior to Admission medications   Medication Sig Start Date End Date Taking? Authorizing Provider  albuterol  (VENTOLIN  HFA) 108 (90 Base) MCG/ACT inhaler Inhale 2 puffs into the lungs every 6 (six) hours as needed for wheezing or shortness of breath. 03/30/23   Nafziger, Darleene, NP  ARIPiprazole  ER (ABILIFY  MAINTENA) 400 MG PRSY prefilled syringe Inject 400 mg into the muscle every 28 (twenty-eight) days. 01/30/24 01/29/25  Arfeen, Leni DASEN, MD  azelastine  (ASTELIN ) 0.1 % nasal spray Place 2 sprays into both nostrils 2 (two) times daily. Use in each nostril as directed 01/25/23   Nafziger, Darleene, NP  cetirizine  (ZYRTEC ) 10 MG tablet TAKE 1 TABLET (10 MG TOTAL) BY MOUTH EVERY MORNING. 02/07/24   Nafziger, Darleene, NP  chlorhexidine  (HIBICLENS ) 4 % external liquid Apply 15 mLs (1 Application total) topically as directed for 30 doses. Use as directed daily for 5 days every other week for 6 weeks. 11/10/23   Kay Kemps, MD  citalopram  (CELEXA ) 20 MG tablet Take 1 tablet (20 mg total) by mouth daily. 01/30/24 04/29/24  Arfeen, Leni DASEN, MD  fenofibrate  (TRICOR ) 145 MG tablet TAKE 1 TABLET BY MOUTH EVERY DAY 02/07/24   Nafziger,  Darleene, NP  finasteride  (PROSCAR ) 5 MG tablet Take 1 tablet (5 mg total) by mouth daily. 11/22/23   Nafziger, Darleene, NP  HYDROcodone  bit-homatropine (HYCODAN) 5-1.5 MG/5ML syrup Take 5 mLs by mouth every 8 (eight) hours as needed for cough. 11/22/23   Nafziger, Darleene, NP  ipratropium-albuterol  (DUONEB) 0.5-2.5 (3) MG/3ML SOLN INHALE 3 ML BY NEBULIZER EVERY 6 HOURS AS NEEDED 04/26/23   Nafziger, Darleene, NP  losartan  (COZAAR ) 50 MG tablet TAKE 1 TABLET BY MOUTH EVERY DAY IN THE MORNING 02/07/24   Nafziger, Darleene, NP  metFORMIN  (GLUCOPHAGE ) 500 MG tablet TAKE 1 TABLET (500 MG TOTAL) BY MOUTH DAILY. 02/07/24   Nafziger, Darleene, NP  methocarbamol  (ROBAXIN ) 500 MG tablet Take 1 tablet (500 mg total) by mouth every 8 (eight) hours as needed for muscle spasms. 11/10/23   Kay Kemps, MD  Multiple Vitamin (MULTIVITAMIN WITH MINERALS) TABS tablet Take 1 tablet by mouth daily.    [provider]  Multiple Vitamins-Minerals (PRESERVISION AREDS 2 PO) Take 1 capsule by mouth 2 (two) times daily.    [provider]  pantoprazole  (PROTONIX ) 40 MG tablet Take 1 tablet (40 mg total) by mouth 2 (two) times daily. 12/04/23   Cirigliano, Vito V, DO  pravastatin  (PRAVACHOL ) 10 MG tablet Take 10 mg by mouth once a week.    [provider]  predniSONE  (DELTASONE ) 10 MG tablet 40 mg x 3 days, 20 mg x 3 days, 10 mg x 3 days 11/22/23   Nafziger,  Darleene, NP  tamsulosin  (FLOMAX ) 0.4 MG CAPS capsule Take 1 capsule (0.4 mg total) by mouth daily. 11/22/23   Nafziger, Darleene, NP  temazepam  (RESTORIL ) 15 MG capsule Take 1 capsule (15 mg total) by mouth at bedtime. 01/30/24   Arfeen, Leni DASEN, MD  VASCEPA  1 g capsule TAKE 2 CAPSULES BY MOUTH TWICE DAILY 12/26/23   Nafziger, Cory, NP    Allergies: Penicillins, Lisinopril , and Zetia  [ezetimibe ]    Review of Systems  Respiratory:  Positive for shortness of breath.   Cardiovascular:  Positive for chest pain.  All other systems reviewed and are negative.   Updated Vital Signs BP (!)  113/55   Pulse (!) 59   Temp 98.1 F (36.7 C)   Resp 13   SpO2 96%   Physical Exam Vitals and nursing note reviewed.  Constitutional:      General: He is not in acute distress.    Appearance: Normal appearance.  HENT:     Head: Normocephalic and atraumatic.  Eyes:     General:        Right eye: No discharge.        Left eye: No discharge.  Cardiovascular:     Rate and Rhythm: Normal rate and regular rhythm.     Heart sounds: No murmur heard.    No friction rub. No gallop.  Pulmonary:     Effort: Pulmonary effort is normal.     Breath sounds: Normal breath sounds.     Comments: No wheezing, rhonchi, stridor, rales Abdominal:     General: Bowel sounds are normal.     Palpations: Abdomen is soft.  Musculoskeletal:     Comments: Subtle 0-1+ edema bilateral lower extremities, patient reporting some swelling of the left upper extremity compared to the right, difficult to assess whether this is the case on my exam.  Skin:    General: Skin is warm and dry.     Capillary Refill: Capillary refill takes less than 2 seconds.  Neurological:     Mental Status: He is alert and oriented to person, place, and time.  Psychiatric:        Mood and Affect: Mood normal.        Behavior: Behavior normal.     (all labs ordered are listed, but only abnormal results are displayed) Labs Reviewed  BASIC METABOLIC PANEL WITH GFR - Abnormal; Notable for the following components:      Result Value   Glucose, Bld 105 (*)    All other components within normal limits  CBC - Abnormal; Notable for the following components:   WBC 3.8 (*)    HCT 38.1 (*)    All other components within normal limits  PRO BRAIN NATRIURETIC PEPTIDE  TROPONIN T, HIGH SENSITIVITY  TROPONIN T, HIGH SENSITIVITY    EKG: None  Radiology: US  Venous Img Upper Left (DVT Study) Result Date: 04/01/2024 CLINICAL DATA:  LEFT upper extremity pain and edema EXAM: LEFT UPPER EXTREMITY VENOUS DOPPLER ULTRASOUND TECHNIQUE:  Gray-scale sonography with graded compression, as well as color Doppler and duplex ultrasound were performed to evaluate the upper extremity deep venous system from the level of the subclavian vein and including the jugular, axillary, basilic, radial, ulnar and upper cephalic vein. Spectral Doppler was utilized to evaluate flow at rest and with distal augmentation maneuvers. COMPARISON:  None available FINDINGS: Contralateral Subclavian Vein: Respiratory phasicity is normal and symmetric with the symptomatic side. No evidence of thrombus. Normal compressibility. Internal Jugular Vein: No evidence of  thrombus. Normal compressibility, respiratory phasicity and response to augmentation. Subclavian Vein: No evidence of thrombus. Normal compressibility, respiratory phasicity and response to augmentation. Axillary Vein: No evidence of thrombus. Normal compressibility, respiratory phasicity and response to augmentation. Cephalic Vein: No evidence of thrombus. Normal compressibility, respiratory phasicity and response to augmentation. Basilic Vein: No evidence of thrombus. Normal compressibility, respiratory phasicity and response to augmentation. Brachial Veins: No evidence of thrombus. Normal compressibility, respiratory phasicity and response to augmentation. Radial Veins: No evidence of thrombus. Normal compressibility, respiratory phasicity and response to augmentation. Ulnar Veins: No evidence of thrombus. Normal compressibility, respiratory phasicity and response to augmentation. Venous Reflux:  None visualized. Other Findings:  None visualized. IMPRESSION: No evidence of DVT within the left upper extremity. Electronically Signed   By: Aliene Lloyd M.D.   On: 04/01/2024 16:55   CT Angio Chest PE W and/or Wo Contrast Result Date: 04/01/2024 CLINICAL DATA:  Shortness of breath, chest tightness. EXAM: CT ANGIOGRAPHY CHEST WITH CONTRAST TECHNIQUE: Multidetector CT imaging of the chest was performed using the standard  protocol during bolus administration of intravenous contrast. Multiplanar CT image reconstructions and MIPs were obtained to evaluate the vascular anatomy. RADIATION DOSE REDUCTION: This exam was performed according to the departmental dose-optimization program which includes automated exposure control, adjustment of the mA and/or kV according to patient size and/or use of iterative reconstruction technique. CONTRAST:  75mL OMNIPAQUE  IOHEXOL  350 MG/ML SOLN COMPARISON:  May 31, 2022. FINDINGS: Cardiovascular: Satisfactory opacification of the pulmonary arteries to the segmental level. No evidence of pulmonary embolism. Normal heart size. No pericardial effusion. Mediastinum/Nodes: No enlarged mediastinal, hilar, or axillary lymph nodes. Thyroid  gland, trachea, and esophagus demonstrate no significant findings. Lungs/Pleura: No pneumothorax or pleural effusion is noted. 13 x 7 mm ill-defined density is seen in the left lower lobe which appears to calls retraction of adjacent major fissure. This is best seen on image number 168 of series 9. This may have been present to some degree on prior exam of 2023, but appears to have significantly progressed. Upper Abdomen: Cholelithiasis. Musculoskeletal: Status post left shoulder arthroplasty. No acute osseous abnormality is noted. Review of the MIP images confirms the above findings. IMPRESSION: No definite evidence of pulmonary embolus. 13 x 7 mm ill-defined density is noted in left lower lobe which appears to be resulting in retraction of adjacent left major fissure. This appears to have progressed significantly since prior exam of 2023. While this may simply represent scarring, malignancy cannot be excluded. PET scan is recommended for further evaluation. Cholelithiasis. Aortic Atherosclerosis (ICD10-I70.0). Electronically Signed   By: Lynwood Landy Raddle M.D.   On: 04/01/2024 16:42   DG Chest 2 View Result Date: 04/01/2024 CLINICAL DATA:  Three day history of  worsening shortness of breath and chest tightness associated with cough EXAM: CHEST - 2 VIEW COMPARISON:  Chest radiograph dated 05/24/2023 FINDINGS: Normal lung volumes. Increased conspicuity of bibasilar patchy opacities. No pleural effusion or pneumothorax. The heart size and mediastinal contours are within normal limits. Left shoulder arthroplasty. IMPRESSION: Increased conspicuity of bibasilar patchy opacities, which may represent atelectasis or pneumonia. Electronically Signed   By: Limin  Xu M.D.   On: 04/01/2024 14:59     Procedures   Medications Ordered in the ED  iohexol  (OMNIPAQUE ) 350 MG/ML injection 100 mL (75 mLs Intravenous Contrast Given 04/01/24 1620)  Medical Decision Making Amount and/or Complexity of Data Reviewed Labs: ordered. Radiology: ordered.  Risk Prescription drug management.   This patient is a 83 y.o. male  who presents to the ED for concern of shob, chest tightness.   Differential diagnoses prior to evaluation: The emergent differential diagnosis includes, but is not limited to,  asthma exacerbation, COPD exacerbation, acute upper respiratory infection, acute bronchitis, chronic bronchitis, interstitial lung disease, ARDS, PE, pneumonia, atypical ACS, carbon monoxide poisoning, spontaneous pneumothorax, new CHF vs CHF exacerbation, versus other . This is not an exhaustive differential.   Past Medical History / Co-morbidities / Social History: GERD, anxiety, bipolar disorder, left bundle branch block, asthma, hypertension  Physical Exam: Physical exam performed. The pertinent findings include: Stable vital signs, no wheezing, rhonchi, stridor, rales.  Question some subtle lower extremity edema, I do not appreciate clear left upper extremity weakness on my exam.  Vital signs stable other than mild hypertension, blood pressure 128/55.  Lab Tests/Imaging studies: I personally interpreted labs/imaging and the pertinent  results include: CBC with mild leukocytopenia, white blood cell 3.8, BMP overall unremarkable, negative troponin x 2, BNP is unremarkable, no evidence of acute heart failure, DVT study of the left upper extremity is negative, PE study negative for PE, there is a questionable nonspecific mass concerning for possible malignancy:  13 x 7 mm ill-defined density is noted in left lower lobe which  appears to be resulting in retraction of adjacent left major  fissure. This appears to have progressed significantly since prior  exam of 2023. While this may simply represent scarring, malignancy  cannot be excluded. PET scan is recommended for further evaluation.  . I agree with the radiologist interpretation.  Cardiac monitoring: EKG obtained and interpreted by myself and attending physician which shows: Normal sinus rhythm, left bundle branch block, T wave inversion in the inferior leads, less evident on today's EKG and lateral leads compared to previous.   Ambulatory referral placed to oncology, otherwise no acute treatment is indicated at this time.   Disposition: After consideration of the diagnostic results and the patients response to treatment, I feel that patient is stable for discharge with plan as above.   emergency department workup does not suggest an emergent condition requiring admission or immediate intervention beyond what has been performed at this time. The plan is: as above. The patient is safe for discharge and has been instructed to return immediately for worsening symptoms, change in symptoms or any other concerns.   Final diagnoses:  Shortness of breath  Lung density present on imaging study    ED Discharge Orders          Ordered    Ambulatory referral to Hematology / Oncology        04/01/24 1726               Rosan Sherlean DEL, PA-C 04/01/24 1726    Neysa Caron PARAS, DO 04/01/24 2313

## 2024-04-01 NOTE — Telephone Encounter (Signed)
       FYI Only or Action Required?: FYI only for provider.  Patient was last seen in primary care on 11/22/2023 by Merna Huxley, NP.  Called Nurse Triage reporting Shortness of Breath.  Symptoms began several days ago.  Interventions attempted: Rest, hydration, or home remedies.  Symptoms are: gradually worsening.  Triage Disposition: See HCP Within 4 Hours (Or PCP Triage)  Patient/caregiver understands and will follow disposition?: Yes     Copied from CRM [id number]. Topic: Clinical - Red Word Triage >> Apr 01, 2024 10:58 AM Frederich PARAS wrote: [facility] that prompted transfer to Nurse Triage: shortness of breath Reason for Disposition  [1] MILD difficulty breathing (e.g., minimal/no SOB at rest, SOB with walking, pulse < 100) AND [2] NEW-onset or WORSE than normal  Answer Assessment - Initial Assessment Questions Recommended to be seen within 4 hours recommended [facility] ED. Sx now severe but worsening and chest tightness. No available appt with PCP until tomorrow.      1. RESPIRATORY STATUS: Describe your breathing? (e.g., wheezing, shortness of breath, unable to speak, severe coughing)      Shortness of breath, chest tightness at times 2. ONSET: When did this breathing problem begin?      3-4 days ago  3. PATTERN Does the difficult breathing come and go, or has it been constant since it started?      Comes and goes 4. SEVERITY: How bad is your breathing? (e.g., mild, moderate, severe)      Ok  now but SOB with walking from bathroom to bedroom, or kitchen 5. RECURRENT SYMPTOM: Have you had difficulty breathing before? If Yes, ask: When was the last time? and What happened that time?      Yes  6. CARDIAC HISTORY: Do you have any history of heart disease? (e.g., heart attack, angina, bypass surgery, angioplasty)      Na  7. LUNG HISTORY: Do you have any history of lung disease?  (e.g., pulmonary embolus, asthma, emphysema)     Hx asthma  8.  CAUSE: What do you think is causing the breathing problem?      Not sure  9. OTHER SYMPTOMS: Do you have any other symptoms? (e.g., chest pain, cough, dizziness, fever, runny nose)     Dizziness with standing , SOB with exertion , chest tightness 10. O2 SATURATION MONITOR:  Do you use an oxygen saturation monitor (pulse oximeter) at home? If Yes, ask: What is your reading (oxygen level) today? What is your usual oxygen saturation reading? (e.g., 95%)       na 11. PREGNANCY: Is there any chance you are pregnant? When was your last menstrual period?       na 12. TRAVEL: Have you traveled out of the country in the last month? (e.g., travel history, exposures)       na  Protocols used: Breathing Difficulty-A-AH

## 2024-04-02 DIAGNOSIS — H353212 Exudative age-related macular degeneration, right eye, with inactive choroidal neovascularization: Secondary | ICD-10-CM | POA: Diagnosis not present

## 2024-04-02 DIAGNOSIS — H353221 Exudative age-related macular degeneration, left eye, with active choroidal neovascularization: Secondary | ICD-10-CM | POA: Diagnosis not present

## 2024-04-02 DIAGNOSIS — H43813 Vitreous degeneration, bilateral: Secondary | ICD-10-CM | POA: Diagnosis not present

## 2024-04-02 NOTE — Telephone Encounter (Signed)
 Pt went to ED yesterday. Pt is also scheduled for 04/04/2024.

## 2024-04-03 ENCOUNTER — Telehealth: Payer: Self-pay

## 2024-04-03 ENCOUNTER — Telehealth: Payer: Self-pay | Admitting: Adult Health

## 2024-04-03 NOTE — Transitions of Care (Post Inpatient/ED Visit) (Signed)
   04/03/2024  Name: Sean Hill MRN: [id number] DOB: Sep 23, 1940  Today's TOC FU Call Status: Today's TOC FU Call Status:: Successful TOC FU Call Completed TOC FU Call Complete Date: 04/03/24 Patient's Name and Date of Birth confirmed.  Transition Care Management Follow-up Telephone Call Date of Discharge: 04/01/24 Discharge Facility: [facility] ([facility]) Type of Discharge: Emergency Department Reason for ED Visit: Other: How have you been since you were released from the hospital?: Better Any questions or concerns?: No  Items Reviewed: Did you receive and understand the discharge instructions provided?: Yes Medications obtained,verified, and reconciled?: Yes (Medications Reviewed) Any new allergies since your discharge?: No Dietary orders reviewed?: No Do you have support at home?: Yes People in Home [RPT]: spouse Name of Support/Comfort Primary Source: sherry  Medications Reviewed Today: Medications Reviewed Today   Medications were not reviewed in this encounter     Home Care and Equipment/Supplies: Were Home Health Services Ordered?: No Any new equipment or medical supplies ordered?: No  Functional Questionnaire: Do you need assistance with bathing/showering or dressing?: No Do you need assistance with meal preparation?: No Do you need assistance with eating?: No Do you have difficulty maintaining continence: No Do you need assistance with getting out of bed/getting out of a chair/moving?: No Do you have difficulty managing or taking your medications?: No  Follow up appointments reviewed: PCP Follow-up appointment confirmed?: Yes Date of PCP follow-up appointment?: 04/04/24 Follow-up Provider: [facility] Follow-up appointment confirmed?: NA Do you need transportation to your follow-up appointment?: No Do you understand care options if your condition(s) worsen?: Yes-patient verbalized understanding    SIGNATURE Aysiah Jurado ,CMA

## 2024-04-03 NOTE — Telephone Encounter (Signed)
 Sherry requested to have [facility] oncology referral to be sent to Sean Hill at [facility]. As of today there is no referral in the system for Oncology.

## 2024-04-03 NOTE — Telephone Encounter (Signed)
Toc call. 

## 2024-04-04 ENCOUNTER — Ambulatory Visit: Admitting: Adult Health

## 2024-04-04 ENCOUNTER — Ambulatory Visit (INDEPENDENT_AMBULATORY_CARE_PROVIDER_SITE_OTHER): Admitting: Adult Health

## 2024-04-04 ENCOUNTER — Encounter: Payer: Self-pay | Admitting: Adult Health

## 2024-04-04 ENCOUNTER — Telehealth: Payer: Self-pay | Admitting: Adult Health

## 2024-04-04 ENCOUNTER — Telehealth: Payer: Self-pay

## 2024-04-04 VITALS — BP 130/80 | HR 97 | Temp 98.1°F | Ht 69.5 in | Wt 272.0 lb

## 2024-04-04 DIAGNOSIS — M25422 Effusion, left elbow: Secondary | ICD-10-CM | POA: Diagnosis not present

## 2024-04-04 DIAGNOSIS — R0602 Shortness of breath: Secondary | ICD-10-CM | POA: Diagnosis not present

## 2024-04-04 DIAGNOSIS — J984 Other disorders of lung: Secondary | ICD-10-CM | POA: Diagnosis not present

## 2024-04-04 NOTE — Telephone Encounter (Signed)
 Copied from CRM [id number]. Topic: Appointments - Scheduling Inquiry for Clinic >> Apr 04, 2024  3:29 PM Shona RAMAN wrote: Reason for CRM: patient needs to be scheduled to see dr wert, recently left hospital on Monday for suspected lung cancer. Please call and schedule  Called and spoke with wife per DPR. Patient was in ED 7/14 for possible lung cancer. I have scheduled pt in next avail spot with Dr. Darlean 8/5. Pts wife would like Dr. Darlean to review pts ED information and see if patient needs to be sooner.  Dr. Darlean can you please advise if patient needs to be seen sooner.  Routing to front staff as well to add patient to cancellation list.

## 2024-04-04 NOTE — Telephone Encounter (Signed)
 [name] called in to inquire about an oncology referral for Lung cancer from ED at [facility]. We do not have an Oncology referral for Sean Hill.

## 2024-04-04 NOTE — Telephone Encounter (Signed)
 Spoke with wife per DPR. Pt and wife is aware of MW note. Nfn

## 2024-04-04 NOTE — Patient Instructions (Addendum)
 I am going to order your a PET Scan   Please call Dr. Darlean and schedule an appointment   Phone: [phone]

## 2024-04-04 NOTE — Progress Notes (Signed)
 Subjective:    Patient ID: Sean Hill, male    DOB: April 22, 1941, 83 y.o.   MRN: [id number]  HPI 83 year old male who  has a past medical history of Anal fissure, Anxiety, Arthritis, Asthma, Bipolar disorder (HCC), Cataract, Depression, Difficult airway for intubation, GERD (gastroesophageal reflux disease), Hemorrhage of colon following colonoscopy (10/08/2019), History of kidney stones, History of MRSA infection, Hyperlipidemia, Hypertension, Impaired hearing, Left bundle branch block (LBBB) on electrocardiogram (10/29/2015), Macular degeneration (senile) of retina, and Pre-diabetes.  He presents to the office today for follow-up after being seen in the emergency room 3 days ago on 04/01/2024 with a concern of chest tightness, shortness of breath, bilateral leg and left upper extremities swelling.  He had no history of blood clots.  Was more worried about the shortness of breath.  He denied cough, fevers, chills.  No history of tobacco abuse  Exam in the emergency room there was no wheezing, rhonchi, stridor, or rales.  He had very subtle edema of the bilateral lower extremities and no noticeable left upper extremity swelling.  Blood work included a CBC which showed mild leukocytopenia with a white blood cell count of 3.8, BMP overall unremarkable, negative troponin x 2, BNP within normal limits.  DVT study of the left upper extremity was negative, PE study negative but show a 13 x 7 mm ill-defined density in the left lower lobe which appears to be resulting in retraction of adjacent left major fissure.  This was not seen on CT scan in 2023.  This may represent scarring but malignancy cannot be excluded.  PET scan recommended for further evaluation.  He was referred to oncology but it looks like the referral went to pulmonary.   He continues to feel short of breath and is wondering what the next steps are to find out what this abnormality is.   He also feels like his left arm has become  more swollen and at times the sleeves of his shirt get tight.    Review of Systems See HPI   Past Medical History:  Diagnosis Date   Anal fissure    Anxiety    Arthritis    right wrist; pt. 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 x 1 yr.   Impaired hearing    Left bundle branch block (LBBB) on electrocardiogram 10/29/2015   Macular degeneration (senile) of retina    left   Pre-diabetes     Social History   Socioeconomic History   Marital status: Married    Spouse name: Not on file   Number of children: Not on file   Years of education: Not on file   Highest education level: Associate degree: occupational, Scientist, product/process development, or vocational program  Occupational History   Not on file  Tobacco Use   Smoking status: Never   Smokeless tobacco: Never  Vaping Use   Vaping status: Never Used  Substance and Sexual Activity   Alcohol use: Not Currently    Comment: occasional.   Drug use: No   Sexual activity: Not Currently  Birth control/protection: None  Other Topics Concern   Not on file  Social History Narrative   Married, lives in [place].  Retired Quarry manager.   Two daughters, both married,3 grandchildren    No regular exercise.  Never smoker.  No ETOH.  No drugs.   Social Drivers of Corporate investment banker Strain: Low Risk  (04/03/2024)   Overall Financial Resource Strain (CARDIA)    Difficulty of Paying Living Expenses: Not hard at all  Food Insecurity: No Food Insecurity (04/03/2024)   Hunger Vital Sign    Worried About Running Out of Food in the Last Year: Never true    Ran Out of Food in the Last Year: Never  true  Transportation Needs: No Transportation Needs (04/03/2024)   PRAPARE - Administrator, Civil Service (Medical): No    Lack of Transportation (Non-Medical): No  Physical Activity: Inactive (04/03/2024)   Exercise Vital Sign    Days of Exercise per Week: 0 days    Minutes of Exercise per Session: Not on file  Stress: Stress Concern Present (04/03/2024)   [employer] of Occupational Health - Occupational Stress Questionnaire    Feeling of Stress: To some extent  Social Connections: Moderately Isolated (04/03/2024)   Social Connection and Isolation Panel    Frequency of Communication with Friends and Family: More than three times a week    Frequency of Social Gatherings with Friends and Family: More than three times a week    Attends Religious Services: Never    Database administrator or Organizations: No    Attends Banker Meetings: Not on file    Marital Status: Married  Intimate Partner Violence: Not At Risk (09/26/2022)   Humiliation, Afraid, Rape, and Kick questionnaire    Fear of Current or Ex-Partner: No    Emotionally Abused: No    Physically Abused: No    Sexually Abused: No    Past Surgical History:  Procedure Laterality Date   ANKLE FUSION Bilateral    ANKLE SURGERY Bilateral    ligament surgery   BIOPSY  04/29/2020   Procedure: BIOPSY;  Surgeon: San Sandor GAILS, DO;  Location: [facility];  Service: Gastroenterology;;   BIOPSY  07/14/2022   Procedure: BIOPSY;  Surgeon: San Sandor GAILS, DO;  Location: [facility];  Service: Gastroenterology;;   CARDIAC CATHETERIZATION  x 2   1983; 11/14/2003   CARPOMETACARPEL SUSPENSION PLASTY Right 09/22/2016   Procedure: right thumb carpometacarpal  ARTHROPLASTY;  Surgeon: Franky Curia, MD;  Location: [facility];  Service: Orthopedics;  Laterality: Right;  right thumb carpometacarpal  ARTHROPLASTY   CARPOMETACARPEL SUSPENSION PLASTY Right 11/02/2017   Procedure: RIGHT THUMB  SUSPENSIONPLASTY WITH TIGHTROPE, DISTAL POLE SCAPHOID  EXCISION;  Surgeon: Curia Franky, MD;  Location: [facility];  Service: Orthopedics;  Laterality: Right;   COLONOSCOPY WITH PROPOFOL  N/A 10/02/2019   Procedure: COLONOSCOPY WITH PROPOFOL ;  Surgeon: San Sandor GAILS, DO;  Location: [facility];  Service: Gastroenterology;  Laterality: N/A;   COLONOSCOPY WITH PROPOFOL  N/A 10/09/2019   Procedure: COLONOSCOPY WITH PROPOFOL ;  Surgeon: Avram Lupita BRAVO, MD;  Location: [facility];  Service: Endoscopy;  Laterality: N/A;   COLONOSCOPY WITH PROPOFOL  N/A 04/29/2020   Procedure: COLONOSCOPY WITH PROPOFOL ;  Surgeon: San Sandor GAILS, DO;  Location: [facility];  Service: Gastroenterology;  Laterality: N/A;   COLONOSCOPY WITH PROPOFOL  N/A 11/11/2021   Procedure: COLONOSCOPY WITH PROPOFOL ;  Surgeon: San Sandor GAILS, DO;  Location: [facility];  Service:  Gastroenterology;  Laterality: N/A;   ELBOW SURGERY Left    ENDOSCOPIC MUCOSAL RESECTION N/A 10/02/2019   Procedure: ENDOSCOPIC MUCOSAL RESECTION;  Surgeon: San Sandor GAILS, DO;  Location: [facility];  Service: Gastroenterology;  Laterality: N/A;   ESOPHAGOGASTRODUODENOSCOPY (EGD) WITH PROPOFOL  N/A 07/14/2022   Procedure: ESOPHAGOGASTRODUODENOSCOPY (EGD) WITH PROPOFOL ;  Surgeon: San Sandor GAILS, DO;  Location: [facility];  Service: Gastroenterology;  Laterality: N/A;   HEMOSTASIS CLIP PLACEMENT  10/09/2019   Procedure: HEMOSTASIS CLIP PLACEMENT;  Surgeon: Avram Lupita BRAVO, MD;  Location: [facility];  Service: Endoscopy;;   HEMOSTASIS CLIP PLACEMENT  11/11/2021   Procedure: HEMOSTASIS CLIP PLACEMENT;  Surgeon: San Sandor GAILS, DO;  Location: [facility];  Service: Gastroenterology;;   HEMOSTASIS CLIP PLACEMENT  07/14/2022   Procedure: HEMOSTASIS CLIP PLACEMENT;  Surgeon: San Sandor GAILS, DO;  Location: [facility];  Service: Gastroenterology;;   INGUINAL HERNIA REPAIR Right    KNEE SURGERY     OPEN REDUCTION  INTERNAL FIXATION (ORIF) DISTAL RADIAL FRACTURE Right 10/29/2015   Procedure: OPEN REDUCTION INTERNAL FIXATION (ORIF) DISTAL RADIAL FRACTURE;  Surgeon: Franky Curia, MD;  Location: [facility];  Service: Orthopedics;  Laterality: Right;   ORIF ELBOW FRACTURE Right    POLYPECTOMY  04/29/2020   Procedure: POLYPECTOMY;  Surgeon: San Sandor GAILS, DO;  Location: [facility];  Service: Gastroenterology;;   POLYPECTOMY  11/11/2021   Procedure: POLYPECTOMY;  Surgeon: San Sandor GAILS, DO;  Location: [facility];  Service: Gastroenterology;;   POLYPECTOMY  07/14/2022   Procedure: POLYPECTOMY;  Surgeon: San Sandor GAILS, DO;  Location: [facility];  Service: Gastroenterology;;   REVERSE SHOULDER ARTHROPLASTY Left 11/10/2023   Procedure: REVERSE SHOULDER ARTHROPLASTY;  Surgeon: Kay Kemps, MD;  Location: [facility];  Service: Orthopedics;  Laterality: Left;  interscalene block, flip room   SAVORY DILATION N/A 07/14/2022   Procedure: SAVORY DILATION;  Surgeon: San Sandor GAILS, DO;  Location: [facility];  Service: Gastroenterology;  Laterality: N/A;   SHOULDER ARTHROSCOPY     SUBMUCOSAL LIFTING INJECTION  10/02/2019   Procedure: SUBMUCOSAL LIFTING INJECTION;  Surgeon: San Sandor GAILS, DO;  Location: [facility];  Service: Gastroenterology;;   TONSILLECTOMY     TOTAL KNEE ARTHROPLASTY Bilateral    TOTAL KNEE REVISION Right 08/25/2021   Procedure: Right knee polyethylene vs total knee arthroplasty revision;  Surgeon: Melodi Lerner, MD;  Location: [facility];  Service: Orthopedics;  Laterality: Right;   ULNAR COLLATERAL LIGAMENT REPAIR Right 12/29/2015   Procedure: REPAIR  RIGHT LATERAL ULNAR COLLATERAL LIGAMENT TEAR  EXTENSOR ORIGIN ;  Surgeon: Franky Curia, MD;  Location: [facility];  Service: Orthopedics;  Laterality: Right;   WRIST ARTHROSCOPY WITH DEBRIDEMENT Right 07/14/2016   Procedure: RIGHT WRIST ARTHROSCOPY WITH DEBRIDEMENT  TRIANGULAR FIBROCARTILAGE  COMPLEX;  Surgeon: Franky Curia, MD;  Location: [facility];  Service: Orthopedics;  Laterality: Right;    Family History  Problem Relation Age of Onset   Alcohol abuse Mother    Ovarian cancer Mother    Alcohol abuse Father    Pancreatic cancer Father    Hyperlipidemia Brother    Barrett's esophagus Brother    Colon cancer Maternal Grandmother    Depression Daughter    Paranoid behavior Daughter    Esophageal cancer Neg Hx    Stomach cancer Neg Hx    Rectal cancer Neg Hx    Colon polyps Neg Hx     Allergies  Allergen Reactions   Penicillins Other (See Comments)    BLISTERS Did  it involve swelling of the face/tongue/throat, SOB, or low BP? No Did it involve sudden or severe rash/hives, skin peeling, or any reaction on the inside of your mouth or nose? No Did you need to seek medical attention at a hospital or doctor's office? No When did it last happen? around 1980    If all above answers are NO, may proceed with cephalosporin use. Tolerated Cephalosporin Date: 08/26/21.     Lisinopril  Diarrhea and Cough    Dry cough, abdominal bloating and diarrhea   Zetia  [Ezetimibe ]     Myalgia     Current Outpatient Medications on File Prior to Visit  Medication Sig Dispense Refill   albuterol  (VENTOLIN  HFA) 108 (90 Base) MCG/ACT inhaler Inhale 2 puffs into the lungs every 6 (six) hours as needed for wheezing or shortness of breath. 6.7 g 3   ARIPiprazole  ER (ABILIFY  MAINTENA) 400 MG PRSY prefilled syringe Inject 400 mg into the muscle every 28 (twenty-eight) days.     azelastine  (ASTELIN ) 0.1 % nasal spray Place 2 sprays into both nostrils 2 (two) times daily. Use in each nostril as directed 30 mL 6   cetirizine  (ZYRTEC ) 10 MG tablet TAKE 1 TABLET (10 MG TOTAL) BY MOUTH EVERY MORNING. 90 tablet 0   chlorhexidine  (HIBICLENS ) 4 % external liquid Apply 15 mLs (1 Application total) topically as directed for 30 doses. Use as directed daily for 5 days every other week for 6  weeks. 946 mL 1   citalopram  (CELEXA ) 20 MG tablet Take 1 tablet (20 mg total) by mouth daily. 90 tablet 0   fenofibrate  (TRICOR ) 145 MG tablet TAKE 1 TABLET BY MOUTH EVERY DAY 90 tablet 0   finasteride  (PROSCAR ) 5 MG tablet Take 1 tablet (5 mg total) by mouth daily. 90 tablet 1   HYDROcodone  bit-homatropine (HYCODAN) 5-1.5 MG/5ML syrup Take 5 mLs by mouth every 8 (eight) hours as needed for cough. 120 mL 0   ipratropium-albuterol  (DUONEB) 0.5-2.5 (3) MG/3ML SOLN INHALE 3 ML BY NEBULIZER EVERY 6 HOURS AS NEEDED 360 mL 0   losartan  (COZAAR ) 50 MG tablet TAKE 1 TABLET BY MOUTH EVERY DAY IN THE MORNING 90 tablet 0   metFORMIN  (GLUCOPHAGE ) 500 MG tablet TAKE 1 TABLET (500 MG TOTAL) BY MOUTH DAILY. 90 tablet 0   methocarbamol  (ROBAXIN ) 500 MG tablet Take 1 tablet (500 mg total) by mouth every 8 (eight) hours as needed for muscle spasms. 40 tablet 1   Multiple Vitamin (MULTIVITAMIN WITH MINERALS) TABS tablet Take 1 tablet by mouth daily.     Multiple Vitamins-Minerals (PRESERVISION AREDS 2 PO) Take 1 capsule by mouth 2 (two) times daily.     pantoprazole  (PROTONIX ) 40 MG tablet Take 1 tablet (40 mg total) by mouth 2 (two) times daily. 180 tablet 3   pravastatin  (PRAVACHOL ) 10 MG tablet Take 10 mg by mouth once a week.     predniSONE  (DELTASONE ) 10 MG tablet 40 mg x 3 days, 20 mg x 3 days, 10 mg x 3 days 21 tablet 0   tamsulosin  (FLOMAX ) 0.4 MG CAPS capsule Take 1 capsule (0.4 mg total) by mouth daily. 90 capsule 1   temazepam  (RESTORIL ) 15 MG capsule Take 1 capsule (15 mg total) by mouth at bedtime. 90 capsule 0   VASCEPA  1 g capsule TAKE 2 CAPSULES BY MOUTH TWICE DAILY 360 capsule 3   Current Facility-Administered Medications on File Prior to Visit  Medication Dose Route Frequency Provider Last Rate Last Admin   ARIPiprazole  ER (ABILIFY  MAINTENA)  400 MG prefilled syringe 400 mg  400 mg Intramuscular Q28 days Tasia Lung, MD   400 mg at 03/19/24 1432    BP 130/80   Pulse 97   Temp 98.1 F  (36.7 C) (Oral)   Ht 5' 9.5 (1.765 m)   Wt 272 lb (123.4 kg)   SpO2 96%   BMI 39.59 kg/m       Objective:   Physical Exam Vitals and nursing note reviewed.  Constitutional:      Appearance: Normal appearance.  Cardiovascular:     Rate and Rhythm: Normal rate and regular rhythm.     Pulses: Normal pulses.     Heart sounds: Normal heart sounds.  Musculoskeletal:        General: Normal range of motion.     Left upper arm: Swelling and edema present.     Comments: Noticeable edema noted from left upper arm to hand.   Skin:    General: Skin is warm.  Neurological:     General: No focal deficit present.     Mental Status: He is alert and oriented to person, place, and time.  Psychiatric:        Mood and Affect: Mood normal.        Behavior: Behavior normal.        Thought Content: Thought content normal.        Judgment: Judgment normal.       Assessment & Plan:  1. Shortness of breath (Primary) - I will order his PET scan today and I advised him to call his Pulmonologist Dr. Darlean and get in to see him. He will do this today. - NM PET Image Initial (PI) Skull Base To Thigh (F-18 FDG); Future  2. Lung density present on imaging study  - NM PET Image Initial (PI) Skull Base To Thigh (F-18 FDG); Future  3. Swelling of joint of upper arm, left - Possibly lymphedema. We know he does not have a blood clot or obstruction from recent imaging. He plans to follow up with his orthopedic surgeon    Darleene Shape, NP  Time spent with patient today was 34 minutes which consisted of chart review, discussing abnormal CT scan shortness of breath and left arm swelling , work up, treatment answering questions and documentation.

## 2024-04-12 ENCOUNTER — Ambulatory Visit (HOSPITAL_COMMUNITY)
Admission: RE | Admit: 2024-04-12 | Discharge: 2024-04-12 | Disposition: A | Discharge status: Home / Self Care | Source: Ambulatory Visit | Attending: Adult Health | Admitting: Adult Health

## 2024-04-12 DIAGNOSIS — K8020 Calculus of gallbladder without cholecystitis without obstruction: Secondary | ICD-10-CM | POA: Diagnosis not present

## 2024-04-12 DIAGNOSIS — K449 Diaphragmatic hernia without obstruction or gangrene: Secondary | ICD-10-CM | POA: Insufficient documentation

## 2024-04-12 DIAGNOSIS — K8689 Other specified diseases of pancreas: Secondary | ICD-10-CM | POA: Insufficient documentation

## 2024-04-12 DIAGNOSIS — J984 Other disorders of lung: Secondary | ICD-10-CM | POA: Diagnosis not present

## 2024-04-12 DIAGNOSIS — N200 Calculus of kidney: Secondary | ICD-10-CM | POA: Insufficient documentation

## 2024-04-12 DIAGNOSIS — R0602 Shortness of breath: Secondary | ICD-10-CM | POA: Insufficient documentation

## 2024-04-12 DIAGNOSIS — K5730 Diverticulosis of large intestine without perforation or abscess without bleeding: Secondary | ICD-10-CM | POA: Diagnosis not present

## 2024-04-12 DIAGNOSIS — R918 Other nonspecific abnormal finding of lung field: Secondary | ICD-10-CM | POA: Diagnosis not present

## 2024-04-12 DIAGNOSIS — R911 Solitary pulmonary nodule: Secondary | ICD-10-CM | POA: Insufficient documentation

## 2024-04-12 DIAGNOSIS — D1779 Benign lipomatous neoplasm of other sites: Secondary | ICD-10-CM | POA: Diagnosis not present

## 2024-04-12 LAB — GLUCOSE, CAPILLARY: Glucose-Capillary: 116 mg/dL — ABNORMAL HIGH (ref 70–99)

## 2024-04-12 MED ORDER — FLUDEOXYGLUCOSE F - 18 (FDG) INJECTION
13.5500 | Freq: Once | INTRAVENOUS | Status: AC
  Administered 2024-04-12: 13.55 via INTRAVENOUS

## 2024-04-16 ENCOUNTER — Ambulatory Visit: Payer: Self-pay | Admitting: Adult Health

## 2024-04-16 ENCOUNTER — Other Ambulatory Visit: Payer: Self-pay | Admitting: Adult Health

## 2024-04-16 DIAGNOSIS — R911 Solitary pulmonary nodule: Secondary | ICD-10-CM

## 2024-04-18 ENCOUNTER — Encounter (HOSPITAL_COMMUNITY): Payer: Self-pay

## 2024-04-18 ENCOUNTER — Ambulatory Visit (HOSPITAL_BASED_OUTPATIENT_CLINIC_OR_DEPARTMENT_OTHER)

## 2024-04-18 VITALS — BP 112/64 | HR 70 | Ht 70.0 in | Wt 270.0 lb

## 2024-04-18 DIAGNOSIS — F319 Bipolar disorder, unspecified: Secondary | ICD-10-CM

## 2024-04-18 NOTE — Progress Notes (Signed)
 Patient arrives today for his due injection of Abilify  Maintena 400 mg. Patient presents well groomed with an appropriate affect. Patient has no complaints or questions. He denies SI/HI or AVH. Injection was prepared as ordered and administered in patients LUOQ. Patient tolerated well and without complaint. He will return in 28 days.    NDC: 40851-927-07 LOT: [id number] EXP: SEP 2027

## 2024-04-19 ENCOUNTER — Other Ambulatory Visit (HOSPITAL_COMMUNITY)

## 2024-04-20 NOTE — Progress Notes (Unsigned)
 Sean Hill, male    DOB: [birth year]   MRN: [id number]   Brief patient profile:  41  yowm never smoker no resp problems as child then around 1972 bad cough in setting of uri had to go to ER and same pattern every 2 years or so and fine in between  while being exposed at work to Varsol (like kerosene) but stopped in 1980s and seemed some better  though continued similar pattern ? 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    Dyspnea:  limited by back more than breathing/ steps are a struggle with but can do s stopping .  Cough: 1st thing in am / mucus is clear today but it's variably been brown assoc with nasal congestion same color  Sleep: flat bed, 2 pillows  SABA use: has nebulizer doesn't really help 02 none/ nor cpap Dysphagia coughs p  food once a week esp rice Rec   Post hosp f/u   04/23/2024  f/u ov/Sean Hill re: ***   maint on ***  No chief complaint on file.   Dyspnea:  *** Cough: *** Sleeping: *** resp cc  SABA use: *** 02: ***  Lung cancer screening :  ***    No obvious day to day or daytime variability or assoc excess/ purulent sputum or mucus plugs or hemoptysis or cp or chest tightness, subjective wheeze or overt sinus or hb symptoms.    Also denies any obvious fluctuation of symptoms with weather or environmental changes or other aggravating or alleviating factors except as outlined above   No unusual exposure hx or h/o childhood pna/ asthma or knowledge of premature birth.  Current Allergies, Complete Past Medical History, Past Surgical History, Family History, and Social History were reviewed in [employer]  record.  ROS  The following are not active complaints unless bolded Hoarseness, sore throat, dysphagia, dental problems, itching, sneezing,  nasal congestion or discharge of excess mucus or purulent secretions, ear ache,   fever, chills, sweats, unintended wt loss or wt gain, classically pleuritic or exertional cp,  orthopnea pnd or arm/hand swelling  or leg swelling, presyncope, palpitations, abdominal pain, anorexia, nausea, vomiting, diarrhea  or change in bowel habits or change in bladder habits, change in stools or change in urine, dysuria, hematuria,  rash, arthralgias, visual complaints, headache, numbness, weakness or ataxia or problems with walking or coordination,  change in mood or  memory.        No outpatient medications have been marked as taking for the 04/23/24 encounter (Appointment) with Sean Monje B, MD.   Current Facility-Administered Medications for the 04/23/24 encounter (Appointment) with Sean Sean NOVAK, MD  Medication   ARIPiprazole  ER (ABILIFY  MAINTENA) 400 MG prefilled syringe 400 mg           Past Medical History:  Diagnosis Date   Anal fissure    Anxiety    Arthritis    right wrist; pt. states everywhere   Asthma    Bipolar disorder (HCC)    Cataract    Depression    Difficult airway for intubation    GERD (gastroesophageal reflux  disease)    Hemorrhage of colon following colonoscopy 10/08/2019   History of kidney stones    History of MRSA infection    left knee after arthroplasty   Hyperlipidemia    Hypertension    states under control with med., has been on med. x 1 yr.   Impaired hearing    Left bundle branch block (LBBB) on electrocardiogram 10/29/2015   Macular degeneration (senile) of retina    left   Pre-diabetes       Objective:    Wts  04/23/2024         ***   04/04/24 272 lb (123.4 kg)  12/04/23 262 lb (118.8 kg)  11/22/23 268 lb (121.6 kg)      Vital signs reviewed  04/23/2024  - Note at rest 02 sats  ***% on ***   General  appearance:    ***     poor dentition   trace bilateral pitting LE  edema ***       Assessment

## 2024-04-23 ENCOUNTER — Ambulatory Visit: Payer: Self-pay | Admitting: Internal Medicine

## 2024-04-23 ENCOUNTER — Ambulatory Visit: Admitting: Internal Medicine

## 2024-04-23 ENCOUNTER — Encounter: Payer: Self-pay | Admitting: Internal Medicine

## 2024-04-23 VITALS — BP 110/50 | HR 54 | Ht 69.0 in | Wt 269.0 lb

## 2024-04-23 DIAGNOSIS — R0609 Other forms of dyspnea: Secondary | ICD-10-CM | POA: Diagnosis not present

## 2024-04-23 DIAGNOSIS — Z6838 Body mass index (BMI) 38.0-38.9, adult: Secondary | ICD-10-CM | POA: Diagnosis not present

## 2024-04-23 LAB — CBC WITH DIFFERENTIAL/PLATELET
Basophils Absolute: 0.1 K/uL (ref 0.0–0.1)
Basophils Relative: 1.5 % (ref 0.0–3.0)
Eosinophils Absolute: 0.1 K/uL (ref 0.0–0.7)
Eosinophils Relative: 2.2 % (ref 0.0–5.0)
HCT: 41.4 % (ref 39.0–52.0)
Hemoglobin: 13.9 g/dL (ref 13.0–17.0)
Lymphocytes Relative: 50.9 % — ABNORMAL HIGH (ref 12.0–46.0)
Lymphs Abs: 2.3 K/uL (ref 0.7–4.0)
MCHC: 33.6 g/dL (ref 30.0–36.0)
MCV: 87.4 fl (ref 78.0–100.0)
Monocytes Absolute: 0.4 K/uL (ref 0.1–1.0)
Monocytes Relative: 7.8 % (ref 3.0–12.0)
Neutro Abs: 1.7 K/uL (ref 1.4–7.7)
Neutrophils Relative %: 37.6 % — ABNORMAL LOW (ref 43.0–77.0)
Platelets: 209 K/uL (ref 150.0–400.0)
RBC: 4.73 Mil/uL (ref 4.22–5.81)
RDW: 14.5 % (ref 11.5–15.5)
WBC: 4.5 K/uL (ref 4.0–10.5)

## 2024-04-23 LAB — BASIC METABOLIC PANEL WITH GFR
BUN: 18 mg/dL (ref 6–23)
CO2: 21 meq/L (ref 19–32)
Calcium: 9.2 mg/dL (ref 8.4–10.5)
Chloride: 107 meq/L (ref 96–112)
Creatinine, Ser: 1.18 mg/dL (ref 0.40–1.50)
GFR: 57.93 mL/min — ABNORMAL LOW (ref >60.00)
Glucose, Bld: 93 mg/dL (ref 70–99)
Potassium: 4.1 meq/L (ref 3.5–5.1)
Sodium: 139 meq/L (ref 135–145)

## 2024-04-23 LAB — TSH: TSH: 1.64 u[IU]/mL (ref 0.35–5.50)

## 2024-04-23 LAB — D-DIMER, QUANTITATIVE: D-Dimer, Quant: 1.02 ug{FEU}/mL — ABNORMAL HIGH (ref <0.50)

## 2024-04-23 LAB — BRAIN NATRIURETIC PEPTIDE: Pro B Natriuretic peptide (BNP): 32 pg/mL (ref 0.0–100.0)

## 2024-04-23 NOTE — Assessment & Plan Note (Signed)
 Onset ? 2000  - PFTs 06/08/22  wnl  - 05/24/2023 @ 272 lbs   Walked on RA  x  2  lap(s) =  approx 500  ft  @ mod  pace, stopped due to doe/fatigue with lowest 02 sats 95%  - BNP 04/23/2024     32  - 04/23/2024   Walked on RA  x  3  lap(s) =  approx 750  ft  @ mod pace, stopped due to end of study  with lowest 02 sats 98%

## 2024-04-23 NOTE — Patient Instructions (Addendum)
 Try duoneb up to every 4 hours if  you think it helps your breathing at all   PFT's next available and I will call you with result    Please remember to go to the lab department   for your tests - we will call you with the results when they are available.      Follow up will be arranged after results are back

## 2024-04-24 ENCOUNTER — Encounter: Payer: Self-pay | Admitting: Adult Health

## 2024-04-24 NOTE — Assessment & Plan Note (Signed)
 Body mass index is 39.72 kg/m.  -  trending down slightly  Lab Results  Component Value Date   TSH 1.64 04/23/2024      Contributing to doe and risk of GERD/dvt/ pet  >>>   reviewed the need and the process to achieve and maintain neg calorie balance > defer f/u primary care including intermittently monitoring thyroid  status      Each maintenance medication was reviewed in detail including emphasizing most importantly the difference between maintenance and prns and under what circumstances the prns are to be triggered using an action plan format where appropriate.  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

## 2024-04-25 ENCOUNTER — Ambulatory Visit: Admitting: Internal Medicine

## 2024-04-25 ENCOUNTER — Other Ambulatory Visit: Payer: Self-pay

## 2024-04-25 ENCOUNTER — Other Ambulatory Visit: Payer: Self-pay | Admitting: Adult Health

## 2024-04-25 DIAGNOSIS — R0602 Shortness of breath: Secondary | ICD-10-CM

## 2024-04-25 DIAGNOSIS — R0609 Other forms of dyspnea: Secondary | ICD-10-CM

## 2024-04-25 LAB — PULMONARY FUNCTION TEST
DL/VA % pred: 107 %
DL/VA: 4.19 ml/min/mmHg/L
DLCO cor % pred: 114 %
DLCO cor: 26.82 ml/min/mmHg
DLCO unc % pred: 111 %
DLCO unc: 26.27 ml/min/mmHg
FEF 25-75 Post: 3.43 L/s
FEF 25-75 Pre: 2.72 L/s
FEF2575-%Change-Post: 26 %
FEF2575-%Pred-Post: 187 %
FEF2575-%Pred-Pre: 148 %
FEV1-%Change-Post: 6 %
FEV1-%Pred-Post: 126 %
FEV1-%Pred-Pre: 118 %
FEV1-Post: 3.41 L
FEV1-Pre: 3.2 L
FEV1FVC-%Change-Post: 0 %
FEV1FVC-%Pred-Pre: 108 %
FEV6-%Change-Post: 5 %
FEV6-%Pred-Post: 121 %
FEV6-%Pred-Pre: 115 %
FEV6-Post: 4.31 L
FEV6-Pre: 4.09 L
FEV6FVC-%Change-Post: 0 %
FEV6FVC-%Pred-Post: 106 %
FEV6FVC-%Pred-Pre: 106 %
FVC-%Change-Post: 5 %
FVC-%Pred-Post: 113 %
FVC-%Pred-Pre: 108 %
FVC-Post: 4.35 L
FVC-Pre: 4.13 L
Post FEV1/FVC ratio: 78 %
Post FEV6/FVC ratio: 99 %
Pre FEV1/FVC ratio: 77 %
Pre FEV6/FVC Ratio: 99 %
RV % pred: 135 %
RV: 3.56 L
TLC % pred: 114 %
TLC: 7.87 L

## 2024-04-25 NOTE — Progress Notes (Signed)
short

## 2024-04-25 NOTE — Progress Notes (Signed)
 Full pft performed today.

## 2024-04-25 NOTE — Patient Instructions (Signed)
 Full pft performed today.

## 2024-04-25 NOTE — Telephone Encounter (Signed)
**Note De-identified  [place] Obfuscation** Please advise 

## 2024-04-27 ENCOUNTER — Ambulatory Visit: Payer: Self-pay | Admitting: Internal Medicine

## 2024-05-01 ENCOUNTER — Encounter (HOSPITAL_COMMUNITY): Payer: Self-pay | Admitting: Psychiatry

## 2024-05-01 ENCOUNTER — Telehealth (HOSPITAL_COMMUNITY): Admitting: Psychiatry

## 2024-05-01 DIAGNOSIS — F5101 Primary insomnia: Secondary | ICD-10-CM

## 2024-05-01 DIAGNOSIS — F319 Bipolar disorder, unspecified: Secondary | ICD-10-CM

## 2024-05-01 DIAGNOSIS — F411 Generalized anxiety disorder: Secondary | ICD-10-CM

## 2024-05-01 MED ORDER — TEMAZEPAM 15 MG PO CAPS: 15.0000 mg | ORAL_CAPSULE | Freq: Every day | ORAL | 0 refills | Status: DC

## 2024-05-01 MED ORDER — CITALOPRAM HYDROBROMIDE 20 MG PO TABS: 20.0000 mg | ORAL_TABLET | Freq: Every day | ORAL | 0 refills | Status: DC

## 2024-05-01 MED ORDER — ARIPIPRAZOLE ER 400 MG IM PRSY: 400.0000 mg | PREFILLED_SYRINGE | INTRAMUSCULAR | Status: DC

## 2024-05-01 NOTE — Progress Notes (Signed)
 [facility] MD Virtual Progress Note   Patient Location: Home Provider Location: Home Office  I connect with patient by telephone and verified that I am speaking with correct person by using two identifiers. I discussed the limitations of evaluation and management by telemedicine and the availability of in person appointments. I also discussed with the patient that there may be a patient responsible charge related to this service. The patient expressed understanding and agreed to proceed.  Sean Hill [id number] 83 y.o.  05/01/2024 10:13 AM  History of Present Illness:  Patient is evaluated by phone session.  He apologized not able to do the video because he does not have video device at home.  He promised to do in person next time.  He reported things are going okay now but few weeks ago he was very nervous and anxious.  He had a visit to the emergency room with shortness of breath and doctor recommended to see the pulmonologist for suspicion of tumor.  Patient told he was very anxious, nervous and recently seen by pulmonologist and find out that he has a lesion in the lung but not too concerned about it.  Patient told he will require scan every 6 months.  Patient told that news helped him a lot and he is feeling better.  He is sleeping good.  Denies any hallucination, paranoia, anger, irritability or any mood swings.  He is trying to lose weight.  He wanted to get on Ozempic but his insurance did not approved.  He is trying on his own to lose the weight.  He has mild tremors but not interfere in his daily activities.  He is consistent with Celexa , temazepam  and Abilify  injection.  Elect to keep his current medication.  He does not drive and his wife takes him to the doctor's appointment.  Past Psychiatric History: H/O multiple hospitalization.  Last inpatient in July 2014. H/O overdose on Latuda  with alcohol. H/O cutting wrist.  Took Tegretol , Depakote , Ambien, Remeron ,  Vistaril , Geodon , Abilify , lithium , Provigil, Zoloft, Neurontin , Lamictal , Wellbutrin , Risperdal  and Pristiq. H/O mania, aggression, getting speeding tickets, excessive buying and impulsive behavior   Past Medical History:  Diagnosis Date   Anal fissure    Anxiety    Arthritis    right wrist; pt. 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 x 1 yr.   Impaired hearing    Left bundle branch block (LBBB) on electrocardiogram 10/29/2015   Macular degeneration (senile) of retina    left   Pre-diabetes     Outpatient Encounter Medications as of 05/01/2024  Medication Sig   albuterol  (VENTOLIN  HFA) 108 (90 Base) MCG/ACT inhaler Inhale 2 puffs into the lungs every 6 (six) hours as needed for wheezing or shortness of breath.   ARIPiprazole  ER (ABILIFY  MAINTENA) 400 MG PRSY prefilled syringe Inject 400 mg into the muscle every 28 (twenty-eight) days.   azelastine  (ASTELIN ) 0.1 % nasal spray Place 2 sprays into both nostrils 2 (two) times daily. Use in each nostril as directed   cetirizine  (ZYRTEC ) 10 MG tablet TAKE  1 TABLET (10 MG TOTAL) BY MOUTH EVERY MORNING.   chlorhexidine  (HIBICLENS ) 4 % external liquid Apply 15 mLs (1 Application total) topically as directed for 30 doses. Use as directed daily for 5 days every other week for 6 weeks.   citalopram  (CELEXA ) 20 MG tablet Take 1 tablet (20 mg total) by mouth daily.   fenofibrate  (TRICOR ) 145 MG tablet TAKE 1 TABLET BY MOUTH EVERY DAY   finasteride  (PROSCAR ) 5 MG tablet Take 1 tablet (5 mg total) by mouth daily.   HYDROcodone  bit-homatropine (HYCODAN) 5-1.5 MG/5ML syrup Take 5 mLs by mouth every 8  (eight) hours as needed for cough.   ipratropium-albuterol  (DUONEB) 0.5-2.5 (3) MG/3ML SOLN INHALE 3 ML BY NEBULIZER EVERY 6 HOURS AS NEEDED   losartan  (COZAAR ) 50 MG tablet TAKE 1 TABLET BY MOUTH EVERY DAY IN THE MORNING   metFORMIN  (GLUCOPHAGE ) 500 MG tablet TAKE 1 TABLET (500 MG TOTAL) BY MOUTH DAILY.   Multiple Vitamin (MULTIVITAMIN WITH MINERALS) TABS tablet Take 1 tablet by mouth daily.   Multiple Vitamins-Minerals (PRESERVISION AREDS 2 PO) Take 1 capsule by mouth 2 (two) times daily.   pantoprazole  (PROTONIX ) 40 MG tablet Take 1 tablet (40 mg total) by mouth 2 (two) times daily.   tamsulosin  (FLOMAX ) 0.4 MG CAPS capsule Take 1 capsule (0.4 mg total) by mouth daily.   temazepam  (RESTORIL ) 15 MG capsule Take 1 capsule (15 mg total) by mouth at bedtime.   VASCEPA  1 g capsule TAKE 2 CAPSULES BY MOUTH TWICE DAILY   Facility-Administered Encounter Medications as of 05/01/2024  Medication   ARIPiprazole  ER (ABILIFY  MAINTENA) 400 MG prefilled syringe 400 mg    Recent Results (from the past 2160 hours)  Basic metabolic panel     Status: Abnormal   Collection Time: 04/01/24  2:07 PM  Result Value Ref Range   Sodium 141 135 - 145 mmol/L   Potassium 4.2 3.5 - 5.1 mmol/L   Chloride 105 98 - 111 mmol/L   CO2 22 22 - 32 mmol/L   Glucose, Bld 105 (H) 70 - 99 mg/dL    Comment: Glucose reference range applies only to samples taken after fasting for at least 8 hours.   BUN 11 8 - 23 mg/dL   Creatinine, Ser 8.88 0.61 - 1.24 mg/dL   Calcium  9.5 8.9 - 10.3 mg/dL   GFR, Estimated >39 >39 mL/min    Comment: (NOTE) Calculated using the CKD-EPI Creatinine Equation (2021)    Anion gap 13 5 - 15    Comment: Performed at [facility], [address] [place], [place] [address]  CBC     Status: Abnormal   Collection Time: 04/01/24  2:07 PM  Result Value Ref Range   WBC 3.8 (L) 4.0 - 10.5 K/uL   RBC 4.37 4.22 - 5.81 MIL/uL   Hemoglobin 13.1 13.0 - 17.0 g/dL   HCT 61.8 (L) 60.9 -  52.0 %   MCV 87.2 80.0 - 100.0 fL   MCH 30.0 26.0 - 34.0 pg   MCHC 34.4 30.0 - 36.0 g/dL   RDW 84.7 88.4 - 84.4 %   Platelets 224 150 - 400 K/uL   nRBC 0.0 0.0 - 0.2 %    Comment: Performed at [facility], [address] [place], [place] [address]  Troponin T, High Sensitivity     Status: None   Collection Time: 04/01/24  2:07 PM  Result Value Ref Range   Troponin T High Sensitivity <15 <19 ng/L  Comment: (NOTE) Biotin concentrations > 1000 ng/mL falsely decrease TnT results.  Serial cardiac troponin measurements are suggested.  Refer to the Links section for chest pain algorithms and additional  guidance. Performed at [facility], [address], [place], [place] [address]   Troponin T, High Sensitivity     Status: None   Collection Time: 04/01/24  4:00 PM  Result Value Ref Range   Troponin T High Sensitivity <15 <19 ng/L    Comment: (NOTE) Biotin concentrations > 1000 ng/mL falsely decrease TnT results.  Serial cardiac troponin measurements are suggested.  Refer to the Links section for chest pain algorithms and additional  guidance. Performed at [facility], [address], [place], [place] [address]   Pro Brain natriuretic peptide     Status: None   Collection Time: 04/01/24  4:00 PM  Result Value Ref Range   Pro Brain Natriuretic Peptide 206.0 <300.0 pg/mL    Comment: (NOTE) Age Group        Cut-Points    Interpretation  < 50 years     450 pg/mL       NT-proBNP > 450 pg/mL indicates                                ADHF is likely              50 to 75 years  900 pg/mL      NT-proBNP > 900 pg/mL indicates          ADHF is likely  > 75 years      1800 pg/mL     NT-proBNP > 1800 pg/mL indicates          ADHF is likely                           All ages    Results between       Indeterminate. Further clinical             300 and the cut-   information is needed to determine            point for age  group   if ADHF is present.                                                             Elecsys proBNP II/ Elecsys proBNP II STAT           Cut-Point                       Interpretation  300 pg/mL                    NT-proBNP <300pg/mL indicates                             ADHF is not likely  Performed at [facility], [address], [place], [place] [address]   Glucose, capillary     Status: Abnormal   Collection Time: 04/12/24  8:46 AM  Result Value Ref Range   Glucose-Capillary 116 (H) 70 - 99 mg/dL  Comment: Glucose reference range applies only to samples taken after fasting for at least 8 hours.  D-dimer, quantitative     Status: Abnormal   Collection Time: 04/23/24  2:30 PM  Result Value Ref Range   D-Dimer, Quant 1.02 (H) <0.50 mcg/mL FEU    Comment: Elevated D-dimer levels are associated with DIC, malignancies, inflammation, sepsis, surgery, trauma, and pregnancy. A D-dimer result less than 0.5 mcg/mL  FEU, in conjunction with a non-high clinical pre-test  probability assessment model, excludes deep vein thrombosis and pulmonary embolism. However, since  D-dimer values increase with age, the [employer] recommends an age-adjusted cut-off value in patients older than 50. The calculation for an age adjusted cut-off value is age (years) x 0.01 mcg/mL  FEU. For example, the cut-off for a 81 year old patient would be 70 x 0.01 mcg/mL FEU. . For additional information, please refer to http://education.[facility].com/faq/FAQ149 (This link is being provided for  informational/educational purposes only.)   TSH     Status: None   Collection Time: 04/23/24  2:31 PM  Result Value Ref Range   TSH 1.64 0.35 - 5.50 uIU/mL  Basic metabolic panel with GFR     Status: Abnormal   Collection Time: 04/23/24  2:31 PM  Result Value Ref Range   Sodium 139 135 - 145 mEq/L   Potassium 4.1 3.5 - 5.1 mEq/L   Chloride 107 96 - 112 mEq/L    CO2 21 19 - 32 mEq/L   Glucose, Bld 93 70 - 99 mg/dL   BUN 18 6 - 23 mg/dL   Creatinine, Ser 8.81 0.40 - 1.50 mg/dL   GFR 42.06 (L) >39.99 mL/min    Comment: Calculated using the CKD-EPI Creatinine Equation (2021)   Calcium  9.2 8.4 - 10.5 mg/dL  Brain natriuretic peptide     Status: None   Collection Time: 04/23/24  2:31 PM  Result Value Ref Range   Pro B Natriuretic peptide (BNP) 32.0 0.0 - 100.0 pg/mL  CBC with Differential/Platelet     Status: Abnormal   Collection Time: 04/23/24  2:31 PM  Result Value Ref Range   WBC 4.5 4.0 - 10.5 K/uL   RBC 4.73 4.22 - 5.81 Mil/uL   Hemoglobin 13.9 13.0 - 17.0 g/dL   HCT 58.5 60.9 - 47.9 %   MCV 87.4 78.0 - 100.0 fl   MCHC 33.6 30.0 - 36.0 g/dL   RDW 85.4 88.4 - 84.4 %   Platelets 209.0 150.0 - 400.0 K/uL   Neutrophils Relative % 37.6 (L) 43.0 - 77.0 %   Lymphocytes Relative 50.9 (H) 12.0 - 46.0 %   Monocytes Relative 7.8 3.0 - 12.0 %   Eosinophils Relative 2.2 0.0 - 5.0 %   Basophils Relative 1.5 0.0 - 3.0 %   Neutro Abs 1.7 1.4 - 7.7 K/uL   Lymphs Abs 2.3 0.7 - 4.0 K/uL   Monocytes Absolute 0.4 0.1 - 1.0 K/uL   Eosinophils Absolute 0.1 0.0 - 0.7 K/uL   Basophils Absolute 0.1 0.0 - 0.1 K/uL  Pulmonary function test     Status: None (Preliminary result)   Collection Time: 04/25/24  2:00 PM  Result Value Ref Range   FVC-Pre 4.13 L   FVC-%Pred-Pre 108 %   FVC-Post 4.35 L   FVC-%Pred-Post 113 %   FVC-%Change-Post 5 %   FEV1-Pre 3.20 L   FEV1-%Pred-Pre 118 %   FEV1-Post 3.41 L   FEV1-%Pred-Post 126 %   FEV1-%Change-Post 6 %   FEV6-Pre 4.09 L  FEV6-%Pred-Pre 115 %   FEV6-Post 4.31 L   FEV6-%Pred-Post 121 %   FEV6-%Change-Post 5 %   Pre FEV1/FVC ratio 77 %   FEV1FVC-%Pred-Pre 108 %   Post FEV1/FVC ratio 78 %   FEV1FVC-%Change-Post 0 %   Pre FEV6/FVC Ratio 99 %   FEV6FVC-%Pred-Pre 106 %   Post FEV6/FVC ratio 99 %   FEV6FVC-%Pred-Post 106 %   FEV6FVC-%Change-Post 0 %   FEF 25-75 Pre 2.72 L/sec   FEF2575-%Pred-Pre 148 %    FEF 25-75 Post 3.43 L/sec   FEF2575-%Pred-Post 187 %   FEF2575-%Change-Post 26 %   RV 3.56 L   RV % pred 135 %   TLC 7.87 L   TLC % pred 114 %   DLCO unc 26.27 ml/min/mmHg   DLCO unc % pred 111 %   DLCO cor 26.82 ml/min/mmHg   DLCO cor % pred 114 %   DL/VA 5.80 ml/min/mmHg/L   DL/VA % pred 892 %     Psychiatric Specialty Exam: Physical Exam  Review of Systems  Weight 269 lb (122 kg).There is no height or weight on file to calculate BMI.  General Appearance: NA  Eye Contact:  NA  Speech:  Slow  Volume:  Normal  Mood:  Euthymic  Affect:  Appropriate  Thought Process:  Goal Directed  Orientation:  Full (Time, Place, and Person)  Thought Content:  Rumination  Suicidal Thoughts:  No  Homicidal Thoughts:  No  Memory:  Immediate;   Fair Recent;   Fair Remote;   Fair  Judgement:  Intact  Insight:  Present  Psychomotor Activity:  NA  Concentration:  Concentration: Fair and Attention Span: Fair  Recall:  [employer] of Knowledge:  Fair  Language:  Good  Akathisia:  No  Handed:  Right  AIMS (if indicated):     Assets:  Communication Skills Desire for Improvement Housing Resilience Social Support Talents/Skills  ADL's:  Intact  Cognition:  WNL  Sleep:  ok       01/17/2023   11:18 AM 01/03/2023    8:40 AM 09/26/2022    2:12 PM 07/29/2022    1:44 PM 05/05/2022    9:14 AM  Depression screen PHQ 2/9  Decreased Interest 2 2 0 0 0  Down, Depressed, Hopeless 1 1 0 0 0  PHQ - 2 Score 3 3 0 0 0  Altered sleeping 3 0   3  Tired, decreased energy 1 3   3   Change in appetite 1 2   3   Feeling bad or failure about yourself  0 0   0  Trouble concentrating 1 0   0  Moving slowly or fidgety/restless 1 0   0  Suicidal thoughts 0 0   0  PHQ-9 Score 10 8   9   Difficult doing work/chores Somewhat difficult Not difficult at all   Very difficult    Assessment/Plan: Bipolar I disorder (HCC) - Plan: ARIPiprazole  ER (ABILIFY  MAINTENA) 400 MG PRSY prefilled syringe, temazepam   (RESTORIL ) 15 MG capsule  GAD (generalized anxiety disorder) - Plan: citalopram  (CELEXA ) 20 MG tablet, temazepam  (RESTORIL ) 15 MG capsule  Primary insomnia - Plan: temazepam  (RESTORIL ) 15 MG capsule  Reviewed blood work results and notes from the emergency room.  He is feeling better since he had a visit with pulmonologist will give him reassurance.  He does not want to change the medication.  Continue Celexa  20 mg daily, Abilify  400 mg intramuscular every 28 days and temazepam  50 mg at bedtime.  Patient agreed to have in person visit next time.  Will follow-up in 3 months.  Recommended to call back if is any question or any concern.   Follow Up Instructions:     I discussed the assessment and treatment plan with the patient. The patient was provided an opportunity to ask questions and all were answered. The patient agreed with the plan and demonstrated an understanding of the instructions.   The patient was advised to call back or seek an in-person evaluation if the symptoms worsen or if the condition fails to improve as anticipated.    Collaboration of Care: Other provider involved in patient's care AEB notes are available in epic to review  Patient/Guardian was advised Release of Information must be obtained prior to any record release in order to collaborate their care with an outside provider. Patient/Guardian was advised if they have not already done so to contact the registration department to sign all necessary forms in order for us  to release information regarding their care.   Consent: Patient/Guardian gives verbal consent for treatment and assignment of benefits for services provided during this visit. Patient/Guardian expressed understanding and agreed to proceed.     Total encounter time 17 minutes which includes face-to-face time, chart reviewed, care coordination, order entry and documentation during this encounter.   Note: This document was prepared by [employer] voice  dictation technology and any errors that results from this process are unintentional.    Leni ONEIDA Client, MD 05/01/2024

## 2024-05-04 ENCOUNTER — Other Ambulatory Visit: Payer: Self-pay | Admitting: Adult Health

## 2024-05-04 DIAGNOSIS — I10 Essential (primary) hypertension: Secondary | ICD-10-CM

## 2024-05-09 ENCOUNTER — Ambulatory Visit (HOSPITAL_COMMUNITY): Admitting: Psychiatry

## 2024-05-17 ENCOUNTER — Other Ambulatory Visit: Payer: Self-pay | Admitting: Adult Health

## 2024-05-17 DIAGNOSIS — N401 Enlarged prostate with lower urinary tract symptoms: Secondary | ICD-10-CM

## 2024-05-19 ENCOUNTER — Other Ambulatory Visit: Payer: Self-pay | Admitting: Adult Health

## 2024-05-22 ENCOUNTER — Ambulatory Visit (HOSPITAL_BASED_OUTPATIENT_CLINIC_OR_DEPARTMENT_OTHER): Admitting: *Deleted

## 2024-05-22 VITALS — BP 132/66 | HR 67 | Ht 71.0 in | Wt 263.2 lb

## 2024-05-22 DIAGNOSIS — F319 Bipolar disorder, unspecified: Secondary | ICD-10-CM | POA: Diagnosis not present

## 2024-05-22 NOTE — Telephone Encounter (Signed)
  The original prescription was discontinued on 05/24/2023 by Darlean Ozell NOVAK, MD. Renewing this prescription may not be appropriate.

## 2024-05-22 NOTE — Patient Instructions (Signed)
 Pt in clinic today for due Abilify  Maintena 400 mg injection. Pt is cooperative and appropriate on approach. Pt is visibly SOB and moving slowly. Says he is dieting and trying to lose weight and has lost 10 lbs so far. Pt relates some of his anxiety to the SOB but feels better since lung nodule appears benign. Pt denies lability, AVH, or SI. Injection prepared as ordered and given in RUOQ without complaint. Darwyn is to return in 28 days for next due injection.

## 2024-05-29 DIAGNOSIS — H353221 Exudative age-related macular degeneration, left eye, with active choroidal neovascularization: Secondary | ICD-10-CM | POA: Diagnosis not present

## 2024-05-29 DIAGNOSIS — H43813 Vitreous degeneration, bilateral: Secondary | ICD-10-CM | POA: Diagnosis not present

## 2024-05-29 DIAGNOSIS — Z961 Presence of intraocular lens: Secondary | ICD-10-CM | POA: Diagnosis not present

## 2024-05-29 DIAGNOSIS — H353212 Exudative age-related macular degeneration, right eye, with inactive choroidal neovascularization: Secondary | ICD-10-CM | POA: Diagnosis not present

## 2024-06-18 ENCOUNTER — Ambulatory Visit (HOSPITAL_BASED_OUTPATIENT_CLINIC_OR_DEPARTMENT_OTHER)

## 2024-06-18 VITALS — BP 112/69 | HR 66 | Ht 71.0 in | Wt 256.0 lb

## 2024-06-18 DIAGNOSIS — F319 Bipolar disorder, unspecified: Secondary | ICD-10-CM

## 2024-06-18 NOTE — Progress Notes (Signed)
 Patient arrives today for his due injection of Abilify  Maintena 400 mg. Patient presents well groomed with an appropriate affect. He has lost another 10 lbs and is adjusting to his new diet well. He misses bread! Patient denies SI/HI or AVH. Injection was prepared as ordered and administered in patients LUOQ. Patient tolerated well and without complaint, he will return in 28 days for his next due injection     NDC: 59148-072-92 LOT: [id number] EXP: SEP 2027

## 2024-06-21 NOTE — Progress Notes (Signed)
 Sean Hill                                          MRN: [id number]   06/21/2024   The [facility] Quality Team Specialist reviewed this patient medical record for the purposes of chart review for care gap closure. The following were reviewed: chart review for care gap closure-kidney health evaluation for diabetes:eGFR  and uACR.    [facility]I Quality Team

## 2024-06-25 ENCOUNTER — Ambulatory Visit
Admission: RE | Admit: 2024-06-25 | Discharge: 2024-06-25 | Disposition: A | Discharge status: Home / Self Care | Source: Ambulatory Visit | Attending: Adult Health | Admitting: Adult Health

## 2024-06-25 DIAGNOSIS — R911 Solitary pulmonary nodule: Secondary | ICD-10-CM

## 2024-06-28 ENCOUNTER — Ambulatory Visit: Payer: Self-pay | Admitting: Adult Health

## 2024-07-08 ENCOUNTER — Emergency Department (HOSPITAL_COMMUNITY)

## 2024-07-08 ENCOUNTER — Encounter (HOSPITAL_COMMUNITY): Payer: Self-pay

## 2024-07-08 ENCOUNTER — Other Ambulatory Visit: Payer: Self-pay

## 2024-07-08 ENCOUNTER — Inpatient Hospital Stay (HOSPITAL_COMMUNITY)
Admission: EM | Admit: 2024-07-08 | Discharge: 2024-07-12 | DRG: 244 | Disposition: A | Discharge status: Home / Self Care | Source: Ambulatory Visit | Attending: Student in an Organized Health Care Education/Training Program | Admitting: Student in an Organized Health Care Education/Training Program

## 2024-07-08 DIAGNOSIS — Z96653 Presence of artificial knee joint, bilateral: Secondary | ICD-10-CM | POA: Diagnosis present

## 2024-07-08 DIAGNOSIS — I10 Essential (primary) hypertension: Secondary | ICD-10-CM | POA: Diagnosis present

## 2024-07-08 DIAGNOSIS — I2510 Atherosclerotic heart disease of native coronary artery without angina pectoris: Secondary | ICD-10-CM | POA: Diagnosis present

## 2024-07-08 DIAGNOSIS — Z96612 Presence of left artificial shoulder joint: Secondary | ICD-10-CM | POA: Diagnosis not present

## 2024-07-08 DIAGNOSIS — Z79899 Other long term (current) drug therapy: Secondary | ICD-10-CM

## 2024-07-08 DIAGNOSIS — R296 Repeated falls: Secondary | ICD-10-CM | POA: Diagnosis present

## 2024-07-08 DIAGNOSIS — Z83438 Family history of other disorder of lipoprotein metabolism and other lipidemia: Secondary | ICD-10-CM

## 2024-07-08 DIAGNOSIS — K219 Gastro-esophageal reflux disease without esophagitis: Secondary | ICD-10-CM | POA: Diagnosis present

## 2024-07-08 DIAGNOSIS — E785 Hyperlipidemia, unspecified: Secondary | ICD-10-CM | POA: Diagnosis present

## 2024-07-08 DIAGNOSIS — R42 Dizziness and giddiness: Secondary | ICD-10-CM

## 2024-07-08 DIAGNOSIS — Z888 Allergy status to other drugs, medicaments and biological substances status: Secondary | ICD-10-CM

## 2024-07-08 DIAGNOSIS — I083 Combined rheumatic disorders of mitral, aortic and tricuspid valves: Secondary | ICD-10-CM | POA: Diagnosis present

## 2024-07-08 DIAGNOSIS — Z981 Arthrodesis status: Secondary | ICD-10-CM

## 2024-07-08 DIAGNOSIS — Z8614 Personal history of Methicillin resistant Staphylococcus aureus infection: Secondary | ICD-10-CM

## 2024-07-08 DIAGNOSIS — I447 Left bundle-branch block, unspecified: Secondary | ICD-10-CM | POA: Diagnosis present

## 2024-07-08 DIAGNOSIS — R55 Syncope and collapse: Secondary | ICD-10-CM | POA: Diagnosis present

## 2024-07-08 DIAGNOSIS — H9190 Unspecified hearing loss, unspecified ear: Secondary | ICD-10-CM | POA: Diagnosis present

## 2024-07-08 DIAGNOSIS — R001 Bradycardia, unspecified: Principal | ICD-10-CM | POA: Diagnosis present

## 2024-07-08 DIAGNOSIS — Z7984 Long term (current) use of oral hypoglycemic drugs: Secondary | ICD-10-CM

## 2024-07-08 DIAGNOSIS — N400 Enlarged prostate without lower urinary tract symptoms: Secondary | ICD-10-CM | POA: Diagnosis present

## 2024-07-08 DIAGNOSIS — I442 Atrioventricular block, complete: Principal | ICD-10-CM | POA: Diagnosis present

## 2024-07-08 DIAGNOSIS — R0789 Other chest pain: Secondary | ICD-10-CM | POA: Diagnosis not present

## 2024-07-08 DIAGNOSIS — F419 Anxiety disorder, unspecified: Secondary | ICD-10-CM | POA: Diagnosis present

## 2024-07-08 DIAGNOSIS — Z818 Family history of other mental and behavioral disorders: Secondary | ICD-10-CM

## 2024-07-08 DIAGNOSIS — Z880 Allergy status to penicillin: Secondary | ICD-10-CM

## 2024-07-08 DIAGNOSIS — F319 Bipolar disorder, unspecified: Secondary | ICD-10-CM | POA: Diagnosis present

## 2024-07-08 LAB — CBC
HCT: 38.2 % — ABNORMAL LOW (ref 39.0–52.0)
Hemoglobin: 12.9 g/dL — ABNORMAL LOW (ref 13.0–17.0)
MCH: 29.2 pg (ref 26.0–34.0)
MCHC: 33.8 g/dL (ref 30.0–36.0)
MCV: 86.4 fL (ref 80.0–100.0)
Platelets: 199 K/uL (ref 150–400)
RBC: 4.42 MIL/uL (ref 4.22–5.81)
RDW: 14.1 % (ref 11.5–15.5)
WBC: 4.2 K/uL (ref 4.0–10.5)
nRBC: 0 % (ref 0.0–0.2)

## 2024-07-08 LAB — TROPONIN I (HIGH SENSITIVITY)
Troponin I (High Sensitivity): 5 ng/L (ref <18)
Troponin I (High Sensitivity): 6 ng/L (ref <18)

## 2024-07-08 LAB — TSH: TSH: 1.331 u[IU]/mL (ref 0.350–4.500)

## 2024-07-08 LAB — BASIC METABOLIC PANEL WITH GFR
Anion gap: 11 (ref 5–15)
BUN: 16 mg/dL (ref 8–23)
CO2: 18 mmol/L — ABNORMAL LOW (ref 22–32)
Calcium: 8.7 mg/dL — ABNORMAL LOW (ref 8.9–10.3)
Chloride: 111 mmol/L (ref 98–111)
Creatinine, Ser: 1.02 mg/dL (ref 0.61–1.24)
GFR, Estimated: >60 mL/min (ref >60)
Glucose, Bld: 100 mg/dL — ABNORMAL HIGH (ref 70–99)
Potassium: 3.7 mmol/L (ref 3.5–5.1)
Sodium: 140 mmol/L (ref 135–145)

## 2024-07-08 LAB — MAGNESIUM: Magnesium: 1.7 mg/dL (ref 1.7–2.4)

## 2024-07-08 LAB — BRAIN NATRIURETIC PEPTIDE: B Natriuretic Peptide: 34.9 pg/mL (ref 0.0–100.0)

## 2024-07-08 MED ORDER — ACETAMINOPHEN 325 MG PO TABS: 650.0000 mg | ORAL_TABLET | ORAL | Status: DC | PRN

## 2024-07-08 MED ORDER — ONDANSETRON HCL 4 MG/2ML IJ SOLN: 4.0000 mg | Freq: Four times a day (QID) | INTRAMUSCULAR | Status: DC | PRN

## 2024-07-08 NOTE — ED Provider Notes (Signed)
 [place] EMERGENCY DEPARTMENT AT [facility] Provider Note   CSN: [id number] Arrival date & time: 07/08/24  1424     Patient presents with: Bradycardia   Sean Hill is a 83 y.o. male.   83 year old male history of hypertension, hyperlipidemia, and left bundle branch block who presents to the emergency department with dizziness and bradycardia.  Says he was watching TV and went to get up when he felt very lightheaded and woozy.  Had some mild chest discomfort as well.  Felt like he was going to pass out and called 911.  Did not fully lose consciousness.  When they arrived he was still bradycardic with a heart rate in the 40s.  See image below for the telemetry strip.  Without intervention heart rate went up into the 70s.  Says he is feeling better.  No chest discomfort at this point in time.  Not on any AV nodal blocking agents.  Passed out once several years ago and reports that since then will have occasional episodes where he feels woozy.       Prior to Admission medications   Medication Sig Start Date End Date Taking? Authorizing Provider  albuterol  (VENTOLIN  HFA) 108 (90 Base) MCG/ACT inhaler Inhale 2 puffs into the lungs every 6 (six) hours as needed for wheezing or shortness of breath. 03/30/23   Nafziger, Darleene, NP  ARIPiprazole  ER (ABILIFY  MAINTENA) 400 MG PRSY prefilled syringe Inject 400 mg into the muscle every 28 (twenty-eight) days. 05/01/24 05/01/25  Arfeen, Leni DASEN, MD  azelastine  (ASTELIN ) 0.1 % nasal spray Place 2 sprays into both nostrils 2 (two) times daily. Use in each nostril as directed 01/25/23   Nafziger, Darleene, NP  cetirizine  (ZYRTEC ) 10 MG tablet TAKE 1 TABLET (10 MG TOTAL) BY MOUTH EVERY MORNING. 05/08/24   Nafziger, Darleene, NP  chlorhexidine  (HIBICLENS ) 4 % external liquid Apply 15 mLs (1 Application total) topically as directed for 30 doses. Use as directed daily for 5 days every other week for 6 weeks. 11/10/23   Kay Kemps, MD  citalopram  (CELEXA )  20 MG tablet Take 1 tablet (20 mg total) by mouth daily. 05/01/24 07/30/24  Arfeen, Leni DASEN, MD  fenofibrate  (TRICOR ) 145 MG tablet TAKE 1 TABLET BY MOUTH EVERY DAY 05/08/24   Nafziger, Darleene, NP  finasteride  (PROSCAR ) 5 MG tablet TAKE 1 TABLET (5 MG TOTAL) BY MOUTH DAILY. 05/17/24   Nafziger, Darleene, NP  HYDROcodone  bit-homatropine (HYCODAN) 5-1.5 MG/5ML syrup Take 5 mLs by mouth every 8 (eight) hours as needed for cough. 11/22/23   Nafziger, Darleene, NP  ipratropium-albuterol  (DUONEB) 0.5-2.5 (3) MG/3ML SOLN INHALE 3 ML BY NEBULIZER EVERY 6 HOURS AS NEEDED 04/26/23   Nafziger, Darleene, NP  losartan  (COZAAR ) 50 MG tablet TAKE 1 TABLET BY MOUTH EVERY DAY IN THE MORNING 05/08/24   Nafziger, Darleene, NP  metFORMIN  (GLUCOPHAGE ) 500 MG tablet TAKE 1 TABLET (500 MG TOTAL) BY MOUTH DAILY. 05/08/24   Nafziger, Darleene, NP  Multiple Vitamin (MULTIVITAMIN WITH MINERALS) TABS tablet Take 1 tablet by mouth daily.    [provider]  Multiple Vitamins-Minerals (PRESERVISION AREDS 2 PO) Take 1 capsule by mouth 2 (two) times daily.    [provider]  pantoprazole  (PROTONIX ) 40 MG tablet Take 1 tablet (40 mg total) by mouth 2 (two) times daily. 12/04/23   Cirigliano, Vito V, DO  tamsulosin  (FLOMAX ) 0.4 MG CAPS capsule TAKE 1 CAPSULE BY MOUTH EVERY DAY 05/17/24   Nafziger, Darleene, NP  temazepam  (RESTORIL ) 15 MG capsule  Take 1 capsule (15 mg total) by mouth at bedtime. 05/01/24   Arfeen, Leni DASEN, MD  VASCEPA  1 g capsule TAKE 2 CAPSULES BY MOUTH TWICE DAILY 12/26/23   Nafziger, Darleene, NP    Allergies: Penicillins, Lisinopril , and Zetia  [ezetimibe ]    Review of Systems  Updated Vital Signs BP (!) 123/51   Pulse 62   Temp 98.4 F (36.9 C) (Oral)   Resp 13   Ht 5' 11 (1.803 m)   Wt 116 kg   SpO2 100%   BMI 35.67 kg/m   Physical Exam Vitals and nursing note reviewed.  Constitutional:      General: He is not in acute distress.    Appearance: He is well-developed.  HENT:     Head: Normocephalic and atraumatic.      Right Ear: External ear normal.     Left Ear: External ear normal.     Nose: Nose normal.  Eyes:     Extraocular Movements: Extraocular movements intact.     Conjunctiva/sclera: Conjunctivae normal.     Pupils: Pupils are equal, round, and reactive to light.  Cardiovascular:     Rate and Rhythm: Normal rate and regular rhythm.     Heart sounds: Normal heart sounds.  Pulmonary:     Effort: Pulmonary effort is normal. No respiratory distress.     Breath sounds: Normal breath sounds.  Musculoskeletal:     Cervical back: Normal range of motion and neck supple.     Right lower leg: No edema.     Left lower leg: No edema.  Skin:    General: Skin is warm and dry.  Neurological:     Mental Status: He is alert. Mental status is at baseline.  Psychiatric:        Mood and Affect: Mood normal.        Behavior: Behavior normal.     (all labs ordered are listed, but only abnormal results are displayed) Labs Reviewed  BASIC METABOLIC PANEL WITH GFR - Abnormal; Notable for the following components:      Result Value   CO2 18 (*)    Glucose, Bld 100 (*)    Calcium  8.7 (*)    All other components within normal limits  CBC - Abnormal; Notable for the following components:   Hemoglobin 12.9 (*)    HCT 38.2 (*)    All other components within normal limits  MAGNESIUM   BRAIN NATRIURETIC PEPTIDE  TROPONIN I (HIGH SENSITIVITY)  TROPONIN I (HIGH SENSITIVITY)    EKG: EKG Interpretation Date/Time:  Monday July 08 2024 15:05:13 EDT Ventricular Rate:  67 PR Interval:  193 QRS Duration:  166 QT Interval:  465 QTC Calculation: 491 R Axis:   51  Text Interpretation: Sinus rhythm Left bundle branch block Confirmed by Yolande Charleston [phone] on 07/08/2024 3:31:59 PM  EMS telemetry   Radiology: DG Chest Portable 1 View Result Date: 07/08/2024 EXAM: 1 VIEW(S) XRAY OF THE CHEST 07/08/2024 03:45:00 PM COMPARISON: 04/01/2024 CLINICAL HISTORY: cp, bradycardia. Triage notes:Pt to ED via EMS  with c/o episode of bradycardia earlier today. Pt reportedly stood up from sitting position and felt dizzy and diaphoretic. Pt denies LOC/falling. Pt c/o chest tightness but denies CP. EMS reports initial HR in 40s. HR ; now in 70s. Pt A\T\Ox4. Pt breathing even and unlabored.  FINDINGS: LUNGS AND PLEURA: No focal pulmonary opacity. No pulmonary edema. No pleural effusion. No pneumothorax. HEART AND MEDIASTINUM: No acute abnormality of the cardiac and mediastinal silhouettes. 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 No acute osseous abnormality. IMPRESSION: 1. No acute cardiopulmonary process identified. Electronically signed by: Waddell Calk MD 07/08/2024 04:17 PM EDT RP Workstation: [id number]     Procedures   Medications Ordered in the ED - No data to display  Clinical Course as of 07/08/24 1745  Mon Jul 08, 2024  1551 Trish from cardiology is sending someon to evaluate the patient [RP]  1721 Leontine Salen NP has seen the pt. Dr Lavona from cardiology will evaluate shortly as well. Signed out to Dr Simon awaiting final cardiology recommendations.  [RP]    Clinical Course User Index [RP] Yolande Lamar BROCKS, MD                                 Medical Decision Making Amount and/or Complexity of Data Reviewed Labs: ordered. Radiology: ordered.  Risk Decision regarding hospitalization.   KAHMARI KOLLER is a 83 year old male history of hypertension, hyperlipidemia, and left bundle branch block who presents to the emergency department with dizziness and bradycardia.   Initial Ddx:  Vasovagal event, MI, heart block, symptomatic bradycardia  MDM/Course:  Patient presents emergency department with symptomatic bradycardia.  Based on EMS EKGs either appears to be symptomatic bradycardia with first-degree heart block or potentially some form of a higher level block.  Somewhat interesting that his baseline is a left bundle branch block but  that he became symptomatic with bradycardia changed to a right bundle branch block.  Was complaining of some chest tightness prior to arrival but sounds like it is resolved.  He is currently in normal sinus rhythm today's exam stable.  Consulted cardiology given his symptomatic bradycardia and will see patient shortly.  Upon re-evaluation remained stable.  Signed out to the oncoming physician awaiting final cardiology recommendations  This patient presents to the ED for concern of complaints listed in HPI, this involves an extensive number of treatment options, and is a complaint that carries with it a high risk of complications and morbidity. Disposition including potential need for admission considered.   Dispo: Pending remainder of workup  Additional history obtained from spouse Records reviewed Outpatient Clinic Notes The following labs were independently interpreted: Chemistry and show no acute abnormality I independently reviewed the following imaging with scope of interpretation limited to determining acute life threatening conditions related to emergency care: Chest x-ray and agree with the radiologist interpretation with the following exceptions: none I personally reviewed and interpreted cardiac monitoring: normal sinus rhythm  I personally reviewed and interpreted the pt's EKG: see above for interpretation  I have reviewed the patients home medications and made adjustments as needed Consults: Cardiology Social Determinants of health:  Geriatric  Portions of this note were generated with Scientist, clinical (histocompatibility and immunogenetics). Dictation errors may occur despite best attempts at proofreading.     Final diagnoses:  Symptomatic bradycardia  Postural dizziness with presyncope    ED Discharge Orders     None          Yolande Lamar BROCKS, MD 07/08/24 1745

## 2024-07-08 NOTE — ED Provider Notes (Signed)
  Physical Exam  BP (!) 123/51   Pulse 62   Temp 98.4 F (36.9 C) (Oral)   Resp 13   Ht 5' 11 (1.803 m)   Wt 116 kg   SpO2 100%   BMI 35.67 kg/m   Physical Exam Vitals and nursing note reviewed.  Constitutional:      General: He is not in acute distress.    Appearance: He is well-developed.  HENT:     Head: Normocephalic and atraumatic.  Eyes:     Conjunctiva/sclera: Conjunctivae normal.  Cardiovascular:     Rate and Rhythm: Normal rate and regular rhythm.     Heart sounds: No murmur heard. Pulmonary:     Effort: Pulmonary effort is normal. No respiratory distress.     Breath sounds: Normal breath sounds.  Abdominal:     Palpations: Abdomen is soft.     Tenderness: There is no abdominal tenderness.  Musculoskeletal:        General: No swelling.     Cervical back: Neck supple.  Skin:    General: Skin is warm and dry.     Capillary Refill: Capillary refill takes less than 2 seconds.  Neurological:     Mental Status: He is alert.  Psychiatric:        Mood and Affect: Mood normal.     Procedures  Procedures  ED Course / MDM   Clinical Course as of 07/08/24 1735  Mon Jul 08, 2024  1551 Trish from cardiology is sending someon to evaluate the patient [RP]  1721 Leontine Salen NP has seen the pt. Dr Lavona from cardiology will evaluate shortly as well. Signed out to Dr Simon awaiting final cardiology recommendations.  [RP]    Clinical Course User Index [RP] Yolande Lamar BROCKS, MD   Medical Decision Making Amount and/or Complexity of Data Reviewed Labs: ordered. Radiology: ordered.  Risk Decision regarding hospitalization.   Received patient signout.  Episode of bradycardia at home.  Has been in normal sinus rhythm here in the ED since then.  Pending second troponin.  Cardiology consult.  Admitted      Simon Lavonia SAILOR, MD 07/08/24 2325

## 2024-07-08 NOTE — ED Triage Notes (Signed)
 Pt to ED via EMS with c/o episode of bradycardia earlier today. Pt reportedly stood up from sitting position and felt dizzy and diaphoretic. Pt denies LOC/falling. Pt c/o chest tightness but denies CP. EMS reports initial HR in 40s. HR now in 70s. Pt A&Ox4. Pt breathing even and unlabored.

## 2024-07-08 NOTE — ED Notes (Signed)
 Cardiology at bedside.

## 2024-07-08 NOTE — Consult Note (Cosign Needed)
 Error

## 2024-07-08 NOTE — H&P (Signed)
 Cardiology History and Physical     Patient ID: DEANDREW Hill MRN: [id number]; DOB: 11-Jul-1941   Admit date: 07/08/2024 Date of Consult: 07/08/2024   PCP:  Merna Huxley, NP              [facility] Providers Cardiologist:  Lynwood Schilling, MD          Patient Profile: Sean Hill is a 83 y.o. male with a hx of hypertension, hyperlipidemia, LBBB,GERD, Bipolar disorder, anxiety, and depression who is being seen 07/08/2024 for the evaluation of bradycardia at the request of Lamar Shan.   History of Present Illness: Mr. Sean Hill in 2017 patient did have a history of  shortness of breath when exerting himself as well as lightheadedness when getting up too fast.  During this time patient was being seen for an LBBB by Dr. Anner. He underwent a cardiac catheterization that showed normal coronaries.    He was last seen by cardiology on 05/26/2022, and this was with Dr. Schilling for DOE. Cardiac workup was unrevealing. Patient was referred to pulmonary by his PCP for DOE and chronic cough. He was last seen by pulmonary on 04/23/2024  at which time he was recommended a PET scan to assess LLL density. PET showed no abnormal radiotracer uptake. Follow up CT chest showed resolution of density, notable for coronary calcifications and possible smoking related respiratory bronchiolitis.    Presented to the ED today after he stood from a sitting position and felt dizzy and diaphoretic. Did have an episode of chest tightness. Per EMS HR in the 40s.  Spreadsheets in the Media tab: show possible sinus rhythm with AV dissociation with junctional escape HR 40, ST changes in V1-V2 though hard to tell if true TWI or p wave an t wave confluence: also RBBB BP: 121/72   HR 63 ECG here in the ED: Sinus rhythm with LBBB VR 67 [unchanged from previous] CXR unremarkable   Pertinent lab work:  Mostly unremarkable CBC & BMP Troponin 5 He has received no medication administration in the  ED.   PTA medications: abilify , celexa , fenofibrate  & vascepa , cozaar  50 mg, flomax , temazepam  On interview shared that he was sitting on the couch when he stood up and became very weak and had a flushed sensation.  The flushed sensation was very brief about 5 seconds, the weakness and lightheadedness dizziness and shortness of breath remained.  His wife then grabbed a heart monitor and noted his heart rate was in the 40s.  They called EMS.  EMS did note that he was bradycardic though strips are in the room.  Appears to be sinus bradycardia heart rate in the 40s.  Patient shared during interview he was still feeling a little weak though he did feel better. Denied recent illness. Denied chest pain or anginal symptoms. Denied significant change in his baseline shortness of breath on exertion. Patient reports 25 lbs weight loss in the last 2 months, stays hydrated       Past Medical History:  Diagnosis Date   Anal fissure     Anxiety     Arthritis      right wrist; pt. states everywhere   Asthma     Bipolar disorder (HCC)     Cataract     Depression     Difficult airway for intubation     GERD (gastroesophageal reflux disease)     Hemorrhage of colon following colonoscopy 10/08/2019   History of kidney stones  History of MRSA infection      left knee after arthroplasty   Hyperlipidemia     Hypertension      states under control with med., has been on med. x 1 yr.   Impaired hearing     Left bundle branch block (LBBB) on electrocardiogram 10/29/2015   Macular degeneration (senile) of retina      left   Pre-diabetes                 Past Surgical History:  Procedure Laterality Date   ANKLE FUSION Bilateral     ANKLE SURGERY Bilateral      ligament surgery   BIOPSY   04/29/2020    Procedure: BIOPSY;  Surgeon: San Sandor GAILS, DO;  Location: [facility];  Service: Gastroenterology;;   BIOPSY   07/14/2022    Procedure: BIOPSY;  Surgeon: San Sandor GAILS, DO;   Location: [facility];  Service: Gastroenterology;;   CARDIAC CATHETERIZATION   x 2    1983; 11/14/2003   CARPOMETACARPEL SUSPENSION PLASTY Right 09/22/2016    Procedure: right thumb carpometacarpal  ARTHROPLASTY;  Surgeon: Franky Curia, MD;  Location: [facility];  Service: Orthopedics;  Laterality: Right;  right thumb carpometacarpal  ARTHROPLASTY   CARPOMETACARPEL SUSPENSION PLASTY Right 11/02/2017    Procedure: RIGHT THUMB SUSPENSIONPLASTY WITH TIGHTROPE, DISTAL POLE SCAPHOID  EXCISION;  Surgeon: Curia Franky, MD;  Location: [facility];  Service: Orthopedics;  Laterality: Right;   COLONOSCOPY WITH PROPOFOL  N/A 10/02/2019    Procedure: COLONOSCOPY WITH PROPOFOL ;  Surgeon: San Sandor GAILS, DO;  Location: [facility];  Service: Gastroenterology;  Laterality: N/A;   COLONOSCOPY WITH PROPOFOL  N/A 10/09/2019    Procedure: COLONOSCOPY WITH PROPOFOL ;  Surgeon: Avram Lupita BRAVO, MD;  Location: [facility];  Service: Endoscopy;  Laterality: N/A;   COLONOSCOPY WITH PROPOFOL  N/A 04/29/2020    Procedure: COLONOSCOPY WITH PROPOFOL ;  Surgeon: San Sandor GAILS, DO;  Location: [facility];  Service: Gastroenterology;  Laterality: N/A;   COLONOSCOPY WITH PROPOFOL  N/A 11/11/2021    Procedure: COLONOSCOPY WITH PROPOFOL ;  Surgeon: San Sandor GAILS, DO;  Location: [facility];  Service: Gastroenterology;  Laterality: N/A;   ELBOW SURGERY Left     ENDOSCOPIC MUCOSAL RESECTION N/A 10/02/2019    Procedure: ENDOSCOPIC MUCOSAL RESECTION;  Surgeon: San Sandor GAILS, DO;  Location: [facility];  Service: Gastroenterology;  Laterality: N/A;   ESOPHAGOGASTRODUODENOSCOPY (EGD) WITH PROPOFOL  N/A 07/14/2022    Procedure: ESOPHAGOGASTRODUODENOSCOPY (EGD) WITH PROPOFOL ;  Surgeon: San Sandor GAILS, DO;  Location: [facility];  Service: Gastroenterology;  Laterality: N/A;   HEMOSTASIS CLIP PLACEMENT   10/09/2019    Procedure: HEMOSTASIS CLIP PLACEMENT;  Surgeon: Avram Lupita BRAVO, MD;   Location: [facility];  Service: Endoscopy;;   HEMOSTASIS CLIP PLACEMENT   11/11/2021    Procedure: HEMOSTASIS CLIP PLACEMENT;  Surgeon: San Sandor GAILS, DO;  Location: [facility];  Service: Gastroenterology;;   HEMOSTASIS CLIP PLACEMENT   07/14/2022    Procedure: HEMOSTASIS CLIP PLACEMENT;  Surgeon: San Sandor GAILS, DO;  Location: [facility];  Service: Gastroenterology;;   INGUINAL HERNIA REPAIR Right     KNEE SURGERY       OPEN REDUCTION INTERNAL FIXATION (ORIF) DISTAL RADIAL FRACTURE Right 10/29/2015    Procedure: OPEN REDUCTION INTERNAL FIXATION (ORIF) DISTAL RADIAL FRACTURE;  Surgeon: Franky Curia, MD;  Location: [facility];  Service: Orthopedics;  Laterality: Right;   ORIF ELBOW FRACTURE Right     POLYPECTOMY   04/29/2020    Procedure:  POLYPECTOMY;  Surgeon: San Sandor GAILS, DO;  Location: [facility];  Service: Gastroenterology;;   POLYPECTOMY   11/11/2021    Procedure: POLYPECTOMY;  Surgeon: San Sandor GAILS, DO;  Location: [facility];  Service: Gastroenterology;;   POLYPECTOMY   07/14/2022    Procedure: POLYPECTOMY;  Surgeon: San Sandor GAILS, DO;  Location: [facility];  Service: Gastroenterology;;   REVERSE SHOULDER ARTHROPLASTY Left 11/10/2023    Procedure: REVERSE SHOULDER ARTHROPLASTY;  Surgeon: Kay Kemps, MD;  Location: [facility];  Service: Orthopedics;  Laterality: Left;  interscalene block, flip room   SAVORY DILATION N/A 07/14/2022    Procedure: SAVORY DILATION;  Surgeon: San Sandor GAILS, DO;  Location: [facility];  Service: Gastroenterology;  Laterality: N/A;   SHOULDER ARTHROSCOPY       SUBMUCOSAL LIFTING INJECTION   10/02/2019    Procedure: SUBMUCOSAL LIFTING INJECTION;  Surgeon: San Sandor GAILS, DO;  Location: [facility];  Service: Gastroenterology;;   TONSILLECTOMY       TOTAL KNEE ARTHROPLASTY Bilateral     TOTAL KNEE REVISION Right 08/25/2021    Procedure: Right knee polyethylene vs total knee arthroplasty revision;   Surgeon: Melodi Lerner, MD;  Location: [facility];  Service: Orthopedics;  Laterality: Right;   ULNAR COLLATERAL LIGAMENT REPAIR Right 12/29/2015    Procedure: REPAIR  RIGHT LATERAL ULNAR COLLATERAL LIGAMENT TEAR  EXTENSOR ORIGIN ;  Surgeon: Franky Curia, MD;  Location: [facility];  Service: Orthopedics;  Laterality: Right;   WRIST ARTHROSCOPY WITH DEBRIDEMENT Right 07/14/2016    Procedure: RIGHT WRIST ARTHROSCOPY WITH DEBRIDEMENT  TRIANGULAR FIBROCARTILAGE COMPLEX;  Surgeon: Franky Curia, MD;  Location: [facility];  Service: Orthopedics;  Laterality: Right;              Scheduled Meds:  ARIPiprazole  ER  400 mg Intramuscular Q28 days        Continuous Infusions:       PRN Meds:         Allergies:    Allergies       Allergies  Allergen Reactions   Penicillins Other (See Comments)      BLISTERS Did it involve swelling of the face/tongue/throat, SOB, or low BP? No Did it involve sudden or severe rash/hives, skin peeling, or any reaction on the inside of your mouth or nose? No Did you need to seek medical attention at a hospital or doctor's office? No When did it last happen? around 1980    If all above answers are NO, may proceed with cephalosporin use. Tolerated Cephalosporin Date: 08/26/21.       Lisinopril  Diarrhea and Cough      Dry cough, abdominal bloating and diarrhea   Zetia  [Ezetimibe ]        Myalgia         Social History:   Social History         Socioeconomic History   Marital status: Married      Spouse name: Not on file   Number of children: Not on file   Years of education: Not on file   Highest education level: Associate degree: occupational, Scientist, product/process development, or vocational program  Occupational History   Not on file  Tobacco Use   Smoking status: Never   Smokeless tobacco: Never  Vaping Use   Vaping status: Never Used  Substance and Sexual Activity   Alcohol use: Not Currently      Comment: occasional.   Drug use: No    Sexual activity: Not Currently  Birth control/protection: None  Other Topics Concern   Not on file  Social History Narrative    Married, lives in [place].  Retired Quarry manager.    Two daughters, both married,3 grandchildren     No regular exercise.  Never smoker.  No ETOH.  No drugs.    Social Drivers of Acupuncturist Strain: Low Risk  (04/03/2024)    Overall Financial Resource Strain (CARDIA)     Difficulty of Paying Living Expenses: Not hard at all  Food Insecurity: No Food Insecurity (04/03/2024)    Hunger Vital Sign     Worried About Running Out of Food in the Last Year: Never true     Ran Out of Food in the Last Year: Never true  Transportation Needs: No Transportation Needs (04/03/2024)    PRAPARE - Therapist, art (Medical): No     Lack of Transportation (Non-Medical): No  Physical Activity: Inactive (04/03/2024)    Exercise Vital Sign     Days of Exercise per Week: 0 days     Minutes of Exercise per Session: Not on file  Stress: Stress Concern Present (04/03/2024)    [employer] of Occupational Health - Occupational Stress Questionnaire     Feeling of Stress: To some extent  Social Connections: Moderately Isolated (04/03/2024)    Social Connection and Isolation Panel     Frequency of Communication with Friends and Family: More than three times a week     Frequency of Social Gatherings with Friends and Family: More than three times a week     Attends Religious Services: Never     Database administrator or Organizations: No     Attends Engineer, structural: Not on file     Marital Status: Married  Catering manager Violence: Not At Risk (09/26/2022)    Humiliation, Afraid, Rape, and Kick questionnaire     Fear of Current or Ex-Partner: No     Emotionally Abused: No     Physically Abused: No     Sexually Abused: No    Family History:        Family History  Problem Relation Age of Onset   Alcohol abuse  Mother     Ovarian cancer Mother     Alcohol abuse Father     Pancreatic cancer Father     Hyperlipidemia Brother     Barrett's esophagus Brother     Colon cancer Maternal Grandmother     Depression Daughter     Paranoid behavior Daughter     Esophageal cancer Neg Hx     Stomach cancer Neg Hx     Rectal cancer Neg Hx     Colon polyps Neg Hx            ROS:  Please see the history of present illness.  All other ROS reviewed and negative.      Physical Exam/Data:     Vitals:    07/08/24 1432 07/08/24 1506  BP:   137/64  Pulse:   65  Resp:   16  Temp:   98.4 F (36.9 C)  TempSrc:   Oral  SpO2:   100%  Weight: 116 kg    Height: 5' 11 (1.803 m)      No intake or output data in the 24 hours ending 07/08/24 1602     07/08/2024    2:32 PM 06/18/2024   10:21 AM  05/22/2024   10:38 AM  Last 3 Weights  Weight (lbs) 255 lb 11.7 oz    Weight (kg) 116 kg       Information is confidential and restricted. Go to Review Flowsheets to unlock data.     Body mass index is 35.67 kg/m.  General:  Well nourished, well developed, in no acute distress HEENT: normal Vascular: Distal pulses 2+ bilaterally Cardiac:  normal S1, S2; RRR; no murmur  Lungs:  clear to auscultation bilaterally, no wheezing, rhonchi or rales  Ext: no edema Musculoskeletal:  No deformities, BUE and BLE strength normal and equal Skin: warm and dry  Neuro:  CNs 2-12 intact, no focal abnormalities noted Psych:  Normal affect    EKG:  The EKG was personally reviewed and demonstrates:  see hpi Telemetry:  Telemetry was personally reviewed and demonstrates:  sinus rhythm with LBBB VR ~65   Relevant CV Studies: Echocardiogram 05/03/2022 IMPRESSIONS     1. Abnormal septal motion from LBBB . Left ventricular ejection fraction,  by estimation, is 50 to 55%. The left ventricle has low normal function.  The left ventricle has no regional wall motion abnormalities. There is  mild left ventricular hypertrophy.  Left  ventricular diastolic parameters were normal. The average left  ventricular global longitudinal strain is -27.0 %. The global longitudinal  strain is normal.   2. Right ventricular systolic function is normal. The right ventricular  size is normal. There is normal pulmonary artery systolic pressure.   3. Left atrial size was moderately dilated.   4. The mitral valve is abnormal. Trivial mitral valve regurgitation. No  evidence of mitral stenosis.   5. Tricuspid valve regurgitation is mild to moderate.   6. The aortic valve is tricuspid. There is mild calcification of the  aortic valve. There is mild thickening of the aortic valve. Aortic valve  regurgitation is not visualized. Aortic valve sclerosis is present, with  no evidence of aortic valve stenosis.   7. Aortic dilatation noted. There is moderate dilatation of the aortic  root, measuring 41 mm. There is mild dilatation of the ascending aorta,  measuring 38 mm.   8. The inferior vena cava is normal in size with greater than 50%  respiratory variability, suggesting right atrial pressure of 3 mmHg.      Laboratory Data:  Hematology Last Labs     Recent Labs  Lab 07/08/24 1432  WBC 4.2  RBC 4.42  HGB 12.9*  HCT 38.2*  MCV 86.4  MCH 29.2  MCHC 33.8  RDW 14.1  PLT 199        Radiology/Studies:  No results found.     Assessment and Plan: Pre-syncope Patient presenting to ED after an episode of flushing, weakness, and lightheadedness/dizziness after standing.  Patient did not have a full syncopal episode He was noted to be bradycardic, and EMS run sheets show possible CHB, evidence of RBBB on run sheets though chronic LBBB. Telemetry unrevealing thus far.  He also reported a significant weight loss in the last 2 months.Has not been recording BP.    Most likely symptomatic bradycardia. He could have had an episode of CHB 2/2 vasovagal, though would like more data.  Continue telemetry, and patient most likely will  benefit from a heart monitor at discharge. Obtaining magnesium  level.    Ordered orthostatic vitals as well given positional change inciting symptoms. Currently on multiple medications that could cause orthostatic hypotension, plus with his weight loss could not be having more hypotension  at baseline. Recommend reducing cozaar  as below and potentially discontinuing flomax  or moving to bedtime. Unlikely ACS given negative troponin and lack of chest pain though through imaging he does have coronary calcifications. Will follow up on 2nd troponin.    Hypertension BP: 121/51 Will reduce cozaar  to 25 mg as his blood pressure may be over treated with current significant intentional weight loss.    Dyslipidemia Continue fenofibrate  Continue vascepa     Chronic DOE Ordered BNP for completeness, though no change from his baseline.  CXR unremarkable. No signs of volume overload.    Depression/Anxiety/Bipolar/GERD/T2DM/BPH PTA medications on hold, if patient is admitted most likely will need hospital service for management   Risk Assessment/Risk Scores:      For questions or updates, please contact [facility] Please consult [email or web address] for contact info under      Signed, Leontine LOISE Salen, PA-C  07/08/2024 4:02 PM  History and all data above reviewed.  I personally took the history today, performed the physical exam and formulated the assessment and plan.   The patient has a history of left bundle branch block.  He reports that he had previous syncope.  This was a while ago but more recently he has had episodes of presyncope including a significant episode today as outlined above.  EMS was called.  He has baseline left bundle branch block which is longstanding.  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  He is not on any AV nodal blocking agents.  He has had no shortness of  breath, PND or orthopnea.  He has had no palpitations, presyncope or syncope.  He had no weight gain or edema.   I reviewed all relevant tests and studies. Patient examined.  I agree with the findings as above.  The patient exam reveals COR: Regular rate and rhythm, no murmurs,  Lungs: Clear to auscultation bilaterally,  Abd: Positive bowel sounds normal frequency pitch, bruits, rebound, guarding, Ext 2+ pulses, no edema.    All available labs, radiology testing, previous records reviewed. Agree with documented assessment and plan.   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  Permanent pacemaker is indicated.  He will be admitted and EP consult in the morning.  Lynwood Shontavia Mickel  7:22 PM  07/08/2024

## 2024-07-09 ENCOUNTER — Observation Stay (HOSPITAL_COMMUNITY)

## 2024-07-09 DIAGNOSIS — I442 Atrioventricular block, complete: Secondary | ICD-10-CM | POA: Diagnosis not present

## 2024-07-09 LAB — ECHOCARDIOGRAM COMPLETE
Area-P 1/2: 2.83 cm2
Height: 70 in
S' Lateral: 4.5 cm
Weight: 4028.25 [oz_av]

## 2024-07-09 LAB — LIPID PANEL
Cholesterol: 187 mg/dL (ref 0–200)
HDL: 30 mg/dL — ABNORMAL LOW (ref >40)
LDL Cholesterol: 119 mg/dL — ABNORMAL HIGH (ref 0–99)
Total CHOL/HDL Ratio: 6.2 ratio
Triglycerides: 188 mg/dL — ABNORMAL HIGH (ref <150)
VLDL: 38 mg/dL (ref 0–40)

## 2024-07-09 LAB — BASIC METABOLIC PANEL WITH GFR
Anion gap: 11 (ref 5–15)
BUN: 19 mg/dL (ref 8–23)
CO2: 22 mmol/L (ref 22–32)
Calcium: 9.3 mg/dL (ref 8.9–10.3)
Chloride: 106 mmol/L (ref 98–111)
Creatinine, Ser: 1.1 mg/dL (ref 0.61–1.24)
GFR, Estimated: >60 mL/min (ref >60)
Glucose, Bld: 103 mg/dL — ABNORMAL HIGH (ref 70–99)
Potassium: 4.1 mmol/L (ref 3.5–5.1)
Sodium: 139 mmol/L (ref 135–145)

## 2024-07-09 LAB — GLUCOSE, CAPILLARY
Glucose-Capillary: 105 mg/dL — ABNORMAL HIGH (ref 70–99)
Glucose-Capillary: 148 mg/dL — ABNORMAL HIGH (ref 70–99)

## 2024-07-09 LAB — CBC
HCT: 41.4 % (ref 39.0–52.0)
Hemoglobin: 14 g/dL (ref 13.0–17.0)
MCH: 29 pg (ref 26.0–34.0)
MCHC: 33.8 g/dL (ref 30.0–36.0)
MCV: 85.9 fL (ref 80.0–100.0)
Platelets: 242 K/uL (ref 150–400)
RBC: 4.82 MIL/uL (ref 4.22–5.81)
RDW: 14 % (ref 11.5–15.5)
WBC: 7.9 K/uL (ref 4.0–10.5)
nRBC: 0 % (ref 0.0–0.2)

## 2024-07-09 LAB — HEMOGLOBIN A1C
Hgb A1c MFr Bld: 5.3 % (ref 4.8–5.6)
Mean Plasma Glucose: 105.41 mg/dL

## 2024-07-09 MED ORDER — TAMSULOSIN HCL 0.4 MG PO CAPS
0.4000 mg | ORAL_CAPSULE | Freq: Every day | ORAL | Status: DC
  Administered 2024-07-09 – 2024-07-12 (×4): 0.4 mg via ORAL
  Filled 2024-07-09 (×4): qty 1

## 2024-07-09 MED ORDER — TEMAZEPAM 15 MG PO CAPS
15.0000 mg | ORAL_CAPSULE | Freq: Every day | ORAL | Status: DC
  Administered 2024-07-09 – 2024-07-11 (×3): 15 mg via ORAL
  Filled 2024-07-09 (×3): qty 1

## 2024-07-09 MED ORDER — CITALOPRAM HYDROBROMIDE 20 MG PO TABS
20.0000 mg | ORAL_TABLET | Freq: Every day | ORAL | Status: DC
  Administered 2024-07-09 – 2024-07-12 (×4): 20 mg via ORAL
  Filled 2024-07-09 (×4): qty 1

## 2024-07-09 MED ORDER — LORATADINE 10 MG PO TABS
10.0000 mg | ORAL_TABLET | Freq: Every day | ORAL | Status: DC
  Administered 2024-07-09 – 2024-07-12 (×4): 10 mg via ORAL
  Filled 2024-07-09 (×4): qty 1

## 2024-07-09 MED ORDER — PANTOPRAZOLE SODIUM 40 MG PO TBEC
40.0000 mg | DELAYED_RELEASE_TABLET | Freq: Two times a day (BID) | ORAL | Status: DC
  Administered 2024-07-09 – 2024-07-12 (×6): 40 mg via ORAL
  Filled 2024-07-09 (×6): qty 1

## 2024-07-09 MED ORDER — INSULIN ASPART 100 UNIT/ML IJ SOLN: 0.0000 [IU] | Freq: Three times a day (TID) | INTRAMUSCULAR | Status: DC

## 2024-07-09 MED ORDER — FENOFIBRATE 160 MG PO TABS
160.0000 mg | ORAL_TABLET | Freq: Every day | ORAL | Status: DC
  Administered 2024-07-09 – 2024-07-12 (×4): 160 mg via ORAL
  Filled 2024-07-09 (×4): qty 1

## 2024-07-09 MED ORDER — LOSARTAN POTASSIUM 25 MG PO TABS
25.0000 mg | ORAL_TABLET | Freq: Every day | ORAL | Status: DC
  Administered 2024-07-09 – 2024-07-12 (×4): 25 mg via ORAL
  Filled 2024-07-09 (×4): qty 1

## 2024-07-09 MED ORDER — ICOSAPENT ETHYL 1 G PO CAPS
2.0000 g | ORAL_CAPSULE | Freq: Two times a day (BID) | ORAL | Status: DC
  Administered 2024-07-09 – 2024-07-12 (×6): 2 g via ORAL
  Filled 2024-07-09 (×6): qty 2

## 2024-07-09 MED ORDER — FINASTERIDE 5 MG PO TABS
5.0000 mg | ORAL_TABLET | Freq: Every day | ORAL | Status: DC
  Administered 2024-07-09 – 2024-07-12 (×4): 5 mg via ORAL
  Filled 2024-07-09 (×4): qty 1

## 2024-07-09 MED ORDER — ALBUTEROL SULFATE (2.5 MG/3ML) 0.083% IN NEBU: 3.0000 mL | INHALATION_SOLUTION | Freq: Four times a day (QID) | RESPIRATORY_TRACT | Status: DC | PRN

## 2024-07-09 NOTE — ED Notes (Signed)
 Floor notified patient coming up

## 2024-07-09 NOTE — Progress Notes (Signed)
 Echocardiogram 2D Echocardiogram has been performed.  Tinnie FORBES Gosling RDCS 07/09/2024, 2:46 PM

## 2024-07-09 NOTE — Plan of Care (Signed)
  Problem: Education: Goal: Knowledge of General Education information will improve Description: Including pain rating scale, medication(s)/side effects and non-pharmacologic comfort measures Outcome: Progressing   Problem: Health Behavior/Discharge Planning: Goal: Ability to manage health-related needs will improve Outcome: Progressing   Problem: Clinical Measurements: Goal: Will remain free from infection Outcome: Progressing Goal: Respiratory complications will improve Outcome: Progressing   Problem: Nutrition: Goal: Adequate nutrition will be maintained Outcome: Progressing   Problem: Elimination: Goal: Will not experience complications related to bowel motility Outcome: Progressing Goal: Will not experience complications related to urinary retention Outcome: Progressing   Problem: Safety: Goal: Ability to remain free from injury will improve Outcome: Progressing   Problem: Education: Goal: Knowledge of disease or condition will improve Outcome: Progressing Goal: Understanding of medication regimen will improve Outcome: Progressing   Problem: Clinical Measurements: Goal: Ability to maintain clinical measurements within normal limits will improve Outcome: Not Progressing Goal: Diagnostic test results will improve Outcome: Not Progressing Goal: Cardiovascular complication will be avoided Outcome: Not Progressing   Problem: Activity: Goal: Risk for activity intolerance will decrease Outcome: Not Progressing   Problem: Coping: Goal: Level of anxiety will decrease Outcome: Not Progressing   Problem: Pain Managment: Goal: General experience of comfort will improve and/or be controlled Outcome: Not Progressing   Problem: Activity: Goal: Ability to tolerate increased activity will improve Outcome: Not Progressing   Problem: Cardiac: Goal: Ability to achieve and maintain adequate cardiopulmonary perfusion will improve Outcome: Not Progressing

## 2024-07-09 NOTE — Progress Notes (Signed)
 Progress Note  Patient Name: Sean Hill Date of Encounter: 07/09/2024  Primary Cardiologist:   Lynwood Schilling, MD   Subjective   No light headedness overnight.  No SOB.   Inpatient Medications    Scheduled Meds:  ARIPiprazole  ER  400 mg Intramuscular Q28 days   Continuous Infusions:  PRN Meds: acetaminophen , ondansetron  (ZOFRAN ) IV   Vital Signs    Vitals:   07/09/24 0700 07/09/24 0730 07/09/24 0800 07/09/24 0815  BP: 122/71 123/66 130/63   Pulse: 69 66 64   Resp:  18 15   Temp:    98.3 F (36.8 C)  TempSrc:    Oral  SpO2: 100% 96% 96%   Weight:      Height:        Intake/Output Summary (Last 24 hours) at 07/09/2024 0841 Last data filed at 07/09/2024 0820 Gross per 24 hour  Intake --  Output 575 ml  Net -575 ml   Filed Weights   07/08/24 1432  Weight: 116 kg    Telemetry    NSR - Personally Reviewed  ECG    NA - Personally Reviewed  Physical Exam   GEN: No acute distress.   Neck: No  JVD Cardiac: RRR, no murmurs, rubs, or gallops.  Respiratory: Clear  to auscultation bilaterally. GI: Soft, nontender, non-distended  MS: No  edema; No deformity. Neuro:  Nonfocal  Psych: Normal affect   Labs    Chemistry Recent Labs  Lab 07/08/24 1432 07/09/24 0555  NA 140 139  K 3.7 4.1  CL 111 106  CO2 18* 22  GLUCOSE 100* 103*  BUN 16 19  CREATININE 1.02 1.10  CALCIUM  8.7* 9.3  GFRNONAA >60 >60  ANIONGAP 11 11     Hematology Recent Labs  Lab 07/08/24 1432 07/09/24 0555  WBC 4.2 7.9  RBC 4.42 4.82  HGB 12.9* 14.0  HCT 38.2* 41.4  MCV 86.4 85.9  MCH 29.2 29.0  MCHC 33.8 33.8  RDW 14.1 14.0  PLT 199 242    Cardiac EnzymesNo results for input(s): TROPONINI in the last 168 hours. No results for input(s): TROPIPOC in the last 168 hours.   BNP Recent Labs  Lab 07/08/24 1716  BNP 34.9     DDimer No results for input(s): DDIMER in the last 168 hours.   Radiology    DG Chest Portable 1 View Result Date:  07/08/2024 EXAM: 1 VIEW(S) XRAY OF THE CHEST 07/08/2024 03:45:00 PM COMPARISON: 04/01/2024 CLINICAL HISTORY: cp, bradycardia. Triage notes:Pt to ED via EMS with c/o episode of bradycardia earlier today. Pt reportedly stood up from sitting position and felt dizzy and diaphoretic. Pt denies LOC/falling. Pt c/o chest tightness but denies CP. EMS reports initial HR in 40s. HR ; now in 70s. Pt A\T\Ox4. Pt breathing even and unlabored.  FINDINGS: LUNGS AND PLEURA: No focal pulmonary opacity. No pulmonary edema. No pleural effusion. No pneumothorax. HEART AND MEDIASTINUM: No acute abnormality of the cardiac and mediastinal silhouettes. BONES AND SOFT TISSUES: Left shoulder reverse total arthroplasty device identified. No acute osseous abnormality. IMPRESSION: 1. No acute cardiopulmonary process identified. Electronically signed by: Waddell Calk MD 07/08/2024 04:17 PM EDT RP Workstation: [id number]    Cardiac Studies   Echo ordered.   Patient Profile     83 y.o. male with a hx of hypertension, hyperlipidemia, LBBB,GERD, Bipolar disorder, anxiety, and depression who is being seen 07/08/2024 for the evaluation of bradycardia at the request of Lamar Shan.   Assessment & Plan  Presyncope:  High degree heart block noted.  EP has been consulted.  He had a borderline LV function in the past and I would like to make sure that he has NL LV function before inserting a device.  BNP was normal.    For questions or updates, please contact [facility] Please consult [email or web address] for contact info under Cardiology/STEMI.   Signed, Lynwood Schilling, MD  07/09/2024, 8:41 AM

## 2024-07-09 NOTE — Consult Note (Signed)
 ELECTROPHYSIOLOGY CONSULT NOTE    Patient ID: Sean Hill MRN: [id number], DOB/AGE: Oct 08, 1940 83 y.o.  Admit date: 07/08/2024 Date of Consult: 07/09/2024  Primary Physician: Merna Huxley, NP Primary Cardiologist: Lynwood Schilling, MD  Electrophysiologist: Dr. Cindie   Referring Provider: Dr. Cindie  Patient Profile: Sean Hill is a 83 y.o. male with a history of LBBB, hypertension, hyperlipidemia, GERD, bipolar disorder, anxiety, depression who is being seen today for the evaluation of near syncope, CHB at the request of Dr. Schilling.  HPI:  Sean Hill is a 83 y.o. male with above noted medical history. He presented to the ED yesterday with EMS after standing from a sitting position and developing dizziness with associated diaphoresis/flushing. He continued to feel lightheaded and dizzy prompting his spouse to check HR, found to be in the 40s. EMS activated. Patient without recent illness, though does report ~25 pounds of intentional weight loss in the last 2 months. Today he acknowledges fairly frequent instances of near syncope over at least the last year, some of which have resulted in falls.   Cardiac history notable for prior LBBB. This ultimately prompted LHC, completed in 2017. No significant CAD found.    Labs Potassium4.1 (10/21 0555) Magnesium   1.7 (10/20 1716) Creatinine, ser  1.10 (10/21 0555) PLT  242 (10/21 0555) HGB  14.0 (10/21 0555) WBC 7.9 (10/21 0555) Troponin I (High Sensitivity)6 (10/20 1716).    Past Medical History:  Diagnosis Date   Anal fissure    Anxiety    Arthritis    right wrist; pt. states everywhere   Asthma    Bipolar disorder (HCC)    Cataract    Depression    Difficult airway for intubation    GERD (gastroesophageal reflux disease)    Hemorrhage of colon following colonoscopy 10/08/2019   History of kidney stones    History of MRSA infection    left knee after arthroplasty   Hyperlipidemia     Hypertension    states under control with med., has been on med. x 1 yr.   Impaired hearing    Left bundle branch block (LBBB) on electrocardiogram 10/29/2015   Macular degeneration (senile) of retina    left   Pre-diabetes      Surgical History:  Past Surgical History:  Procedure Laterality Date   ANKLE FUSION Bilateral    ANKLE SURGERY Bilateral    ligament surgery   BIOPSY  04/29/2020   Procedure: BIOPSY;  Surgeon: San Sandor GAILS, DO;  Location: [facility];  Service: Gastroenterology;;   BIOPSY  07/14/2022   Procedure: BIOPSY;  Surgeon: San Sandor GAILS, DO;  Location: [facility];  Service: Gastroenterology;;   CARDIAC CATHETERIZATION  x 2   1983; 11/14/2003   CARPOMETACARPEL SUSPENSION PLASTY Right 09/22/2016   Procedure: right thumb carpometacarpal  ARTHROPLASTY;  Surgeon: Franky Curia, MD;  Location: [facility];  Service: Orthopedics;  Laterality: Right;  right thumb carpometacarpal  ARTHROPLASTY   CARPOMETACARPEL SUSPENSION PLASTY Right 11/02/2017   Procedure: RIGHT THUMB SUSPENSIONPLASTY WITH TIGHTROPE, DISTAL POLE SCAPHOID  EXCISION;  Surgeon: Curia Franky, MD;  Location:  SURGERY CENTER;  Service: Orthopedics;  Laterality: Right;   COLONOSCOPY WITH PROPOFOL  N/A 10/02/2019   Procedure: COLONOSCOPY WITH PROPOFOL ;  Surgeon: San Sandor GAILS, DO;  Location: [facility];  Service: Gastroenterology;  Laterality: N/A;   COLONOSCOPY WITH PROPOFOL  N/A 10/09/2019   Procedure: COLONOSCOPY WITH PROPOFOL ;  Surgeon: Avram Lupita BRAVO, MD;  Location: [facility];  Service: Endoscopy;  Laterality: N/A;   COLONOSCOPY WITH PROPOFOL  N/A 04/29/2020   Procedure: COLONOSCOPY WITH PROPOFOL ;  Surgeon: San Sandor GAILS, DO;  Location: [facility];  Service: Gastroenterology;  Laterality: N/A;   COLONOSCOPY WITH PROPOFOL  N/A 11/11/2021   Procedure: COLONOSCOPY WITH PROPOFOL ;  Surgeon: San Sandor GAILS, DO;  Location: [facility];  Service: Gastroenterology;   Laterality: N/A;   ELBOW SURGERY Left    ENDOSCOPIC MUCOSAL RESECTION N/A 10/02/2019   Procedure: ENDOSCOPIC MUCOSAL RESECTION;  Surgeon: San Sandor GAILS, DO;  Location: [facility];  Service: Gastroenterology;  Laterality: N/A;   ESOPHAGOGASTRODUODENOSCOPY (EGD) WITH PROPOFOL  N/A 07/14/2022   Procedure: ESOPHAGOGASTRODUODENOSCOPY (EGD) WITH PROPOFOL ;  Surgeon: San Sandor GAILS, DO;  Location: [facility];  Service: Gastroenterology;  Laterality: N/A;   HEMOSTASIS CLIP PLACEMENT  10/09/2019   Procedure: HEMOSTASIS CLIP PLACEMENT;  Surgeon: Avram Lupita BRAVO, MD;  Location: [facility];  Service: Endoscopy;;   HEMOSTASIS CLIP PLACEMENT  11/11/2021   Procedure: HEMOSTASIS CLIP PLACEMENT;  Surgeon: San Sandor GAILS, DO;  [facility];  Service: Gastroenterology;;   HEMOSTASIS CLIP PLACEMENT  07/14/2022   Procedure: HEMOSTASIS CLIP PLACEMENT;  Surgeon: San Sandor GAILS, DO;  [facility];  Service: Gastroenterology;;   INGUINAL HERNIA REPAIR Right    KNEE SURGERY     OPEN REDUCTION INTERNAL FIXATION (ORIF) DISTAL RADIAL FRACTURE Right 10/29/2015   Procedure: OPEN REDUCTION INTERNAL FIXATION (ORIF) DISTAL RADIAL FRACTURE;  Surgeon: Franky Curia, MD;  Location: [facility];  Service: Orthopedics;  Laterality: Right;   ORIF ELBOW FRACTURE Right    POLYPECTOMY  04/29/2020   Procedure: POLYPECTOMY;  Surgeon: San Sandor GAILS, DO;  Location: [facility];  Service: Gastroenterology;;   POLYPECTOMY  11/11/2021   Procedure: POLYPECTOMY;  Surgeon: San Sandor GAILS, DO;  Location: [facility];  Service: Gastroenterology;;   POLYPECTOMY  07/14/2022   Procedure: POLYPECTOMY;  Surgeon: San Sandor GAILS, DO;  Location: [facility];  Service: Gastroenterology;;   REVERSE SHOULDER ARTHROPLASTY Left 11/10/2023   Procedure: REVERSE SHOULDER ARTHROPLASTY;  Surgeon: Kay Kemps, MD;  Location: [facility];  Service: Orthopedics;  Laterality: Left;  interscalene block,  flip room   SAVORY DILATION N/A 07/14/2022   Procedure: SAVORY DILATION;  Surgeon: San Sandor GAILS, DO;  Location: [facility];  Service: Gastroenterology;  Laterality: N/A;   SHOULDER ARTHROSCOPY     SUBMUCOSAL LIFTING INJECTION  10/02/2019   Procedure: SUBMUCOSAL LIFTING INJECTION;  Surgeon: San Sandor GAILS, DO;  Location: [facility];  Service: Gastroenterology;;   TONSILLECTOMY     TOTAL KNEE ARTHROPLASTY Bilateral    TOTAL KNEE REVISION Right 08/25/2021   Procedure: Right knee polyethylene vs total knee arthroplasty revision;  Surgeon: Melodi Lerner, MD;  Location: [facility];  Service: Orthopedics;  Laterality: Right;   ULNAR COLLATERAL LIGAMENT REPAIR Right 12/29/2015   Procedure: REPAIR  RIGHT LATERAL ULNAR COLLATERAL LIGAMENT TEAR  EXTENSOR ORIGIN ;  Surgeon: Franky Curia, MD;  Location: [facility];  Service: Orthopedics;  Laterality: Right;   WRIST ARTHROSCOPY WITH DEBRIDEMENT Right 07/14/2016   Procedure: RIGHT WRIST ARTHROSCOPY WITH DEBRIDEMENT  TRIANGULAR FIBROCARTILAGE COMPLEX;  Surgeon: Franky Curia, MD;  Location: [facility];  Service: Orthopedics;  Laterality: Right;     (Not in a hospital admission)   Inpatient Medications:   ARIPiprazole  ER  400 mg Intramuscular Q28 days    Allergies:  Allergies  Allergen Reactions   Penicillins Other (See Comments)    BLISTERS Did it involve swelling of the face/tongue/throat, SOB, or low BP? No  Did it involve sudden or severe rash/hives, skin peeling, or any reaction on the inside of your mouth or nose? No Did you need to seek medical attention at a hospital or doctor's office? No When did it last happen? around 1980    If all above answers are NO, may proceed with cephalosporin use. Tolerated Cephalosporin Date: 08/26/21.     Lisinopril  Diarrhea, Other (See Comments) and Cough    Dry cough, abdominal bloating and diarrhea   Zetia  [Ezetimibe ]     Myalgia     Family History  Problem  Relation Age of Onset   Alcohol abuse Mother    Ovarian cancer Mother    Alcohol abuse Father    Pancreatic cancer Father    Hyperlipidemia Brother    Barrett's esophagus Brother    Colon cancer Maternal Grandmother    Depression Daughter    Paranoid behavior Daughter    Esophageal cancer Neg Hx    Stomach cancer Neg Hx    Rectal cancer Neg Hx    Colon polyps Neg Hx      Physical Exam: Vitals:   07/09/24 0700 07/09/24 0730 07/09/24 0800 07/09/24 0815  BP: 122/71 123/66 130/63   Pulse: 69 66 64   Resp:  18 15   Temp:    98.3 F (36.8 C)  TempSrc:    Oral  SpO2: 100% 96% 96%   Weight:      Height:        GEN- NAD, A&O x 3, normal affect HEENT: Normocephalic, atraumatic Lungs- CTAB, Normal effort.  Heart- Regular rate and rhythm, No M/G/R.  GI- Soft, NT, ND.  Extremities- No clubbing, cyanosis, or edema   Radiology/Studies: DG Chest Portable 1 View Result Date: 07/08/2024 EXAM: 1 VIEW(S) XRAY OF THE CHEST 07/08/2024 03:45:00 PM COMPARISON: 04/01/2024 CLINICAL HISTORY: cp, bradycardia. Triage notes:Pt to ED via EMS with c/o episode of bradycardia earlier today. Pt reportedly stood up from sitting position and felt dizzy and diaphoretic. Pt denies LOC/falling. Pt c/o chest tightness but denies CP. EMS reports initial HR in 40s. HR ; now in 70s. Pt A\T\Ox4. Pt breathing even and unlabored.  FINDINGS: LUNGS AND PLEURA: No focal pulmonary opacity. No pulmonary edema. No pleural effusion. No pneumothorax. HEART AND MEDIASTINUM: No acute abnormality of the cardiac and mediastinal silhouettes. BONES AND SOFT TISSUES: Left shoulder reverse total arthroplasty device identified. No acute osseous abnormality. IMPRESSION: 1. No acute cardiopulmonary process identified. Electronically signed by: Waddell Calk MD 07/08/2024 04:17 PM EDT RP Workstation: [id number]   CT Chest Wo Contrast Result Date: 06/28/2024 CLINICAL DATA:  Follow-up pulmonary nodule * Tracking Code: BO * EXAM: CT  CHEST WITHOUT CONTRAST TECHNIQUE: Multidetector CT imaging of the chest was performed following the standard protocol without IV contrast. RADIATION DOSE REDUCTION: This exam was performed according to the departmental dose-optimization program which includes automated exposure control, adjustment of the mA and/or kV according to patient size and/or use of iterative reconstruction technique. COMPARISON:  PET-CT, 04/12/2024 FINDINGS: Cardiovascular: Scattered aortic atherosclerosis. Normal heart size. Left and right coronary artery calcifications. No pericardial effusion. Mediastinum/Nodes: No enlarged mediastinal, hilar, or axillary lymph nodes. Small hiatal hernia. Thyroid  gland, trachea, and esophagus demonstrate no significant findings. Lungs/Pleura: Interval resolution of the previously seen ground-glass nodule in the anterior infrahilar left lower lobe (series 8, image 66). Mild dependent bibasilar scarring or atelectasis. Background of fine centrilobular nodularity throughout the lungs, most concentrated in the lung apices. No pleural effusion or pneumothorax. Upper Abdomen: No acute abnormality.  Gallstones. Hepatic steatosis. Musculoskeletal: No chest wall abnormality. No acute osseous findings. 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 IMPRESSION: 1. Interval resolution of the previously seen ground-glass nodule in the anterior infrahilar left lower lobe, consistent with resolved infection or inflammation. 2. Background of fine centrilobular nodularity throughout the lungs, most concentrated in the lung apices, most commonly seen in smoking-related respiratory bronchiolitis. 3. Coronary artery disease. 4. Cholelithiasis. 5. Hepatic steatosis. Aortic Atherosclerosis (ICD10-I70.0). Electronically Signed   By: Marolyn JONETTA Jaksch M.D.   On: 06/28/2024 09:17   ZXH:dpwld rhythm with LBBB (personally reviewed)  TELEMETRY: sinus rhythm, rates 60s. No  pauses or bradycardia since admission (personally reviewed)  DEVICE HISTORY: n/a  Assessment/Plan:  Symptomatic bradycardia Complete heart block LBBB  Patient with baseline/longstanding LBBB admitted after developing pre-syncope associated with bradycardia and transient complete heart block. On EMS rhythm strips, RBBB escape morphology while in CHB. No recurrent arrhythmia since admission. No significant electrolyte derangements to explain his arrhythmia yesterday. Reported fatigue and near syncopal events over the last year are consistent with transient CHB. Tentatively plan for PPM this admission, Thursday 10/23.  Update echocardiogram      For questions or updates, please contact [facility] Please consult [email or web address] for contact info under     Signed, Artist Pouch, PA-C  07/09/2024, 9:12 AM

## 2024-07-09 NOTE — TOC CM/SW Note (Signed)
 Transition of Care [facility]) - Inpatient Brief Assessment   Patient Details  Name: Sean Hill MRN: [id number] Date of Birth: 1941-01-31  [facility] of [facility]) CM/SW Contact:    Waddell Barnie Rama, RN Phone Number: 07/09/2024, 10:37 AM   Clinical Narrative: From home with spouse, has PCP and insurance on file, states has no HH services in place at this time or DME at home.  States family member  (wife) will transport them home at [employer] and family is support system, states gets medications from [facility] in [employer] on [address].  Pta self ambulatory.   There are no ICM needs identified  at this time.  Please place consult for ICM needs.     Transition of Care Asessment: Insurance and Status: Insurance coverage has been reviewed Patient has primary care physician: Yes Home environment has been reviewed: home with wife Prior level of function:: indep Prior/Current Home Services: No current home services Social Drivers of Health Review: SDOH reviewed no interventions necessary Readmission risk has been reviewed: Yes Transition of care needs: no transition of care needs at this time

## 2024-07-10 DIAGNOSIS — Z880 Allergy status to penicillin: Secondary | ICD-10-CM | POA: Diagnosis not present

## 2024-07-10 DIAGNOSIS — K219 Gastro-esophageal reflux disease without esophagitis: Secondary | ICD-10-CM | POA: Diagnosis not present

## 2024-07-10 DIAGNOSIS — Z96612 Presence of left artificial shoulder joint: Secondary | ICD-10-CM | POA: Diagnosis not present

## 2024-07-10 DIAGNOSIS — I447 Left bundle-branch block, unspecified: Secondary | ICD-10-CM | POA: Diagnosis not present

## 2024-07-10 DIAGNOSIS — I442 Atrioventricular block, complete: Secondary | ICD-10-CM | POA: Diagnosis not present

## 2024-07-10 DIAGNOSIS — Z950 Presence of cardiac pacemaker: Secondary | ICD-10-CM | POA: Diagnosis not present

## 2024-07-10 DIAGNOSIS — I083 Combined rheumatic disorders of mitral, aortic and tricuspid valves: Secondary | ICD-10-CM | POA: Diagnosis not present

## 2024-07-10 DIAGNOSIS — R296 Repeated falls: Secondary | ICD-10-CM | POA: Diagnosis not present

## 2024-07-10 DIAGNOSIS — R001 Bradycardia, unspecified: Secondary | ICD-10-CM | POA: Diagnosis not present

## 2024-07-10 DIAGNOSIS — H9190 Unspecified hearing loss, unspecified ear: Secondary | ICD-10-CM | POA: Diagnosis not present

## 2024-07-10 DIAGNOSIS — Z981 Arthrodesis status: Secondary | ICD-10-CM | POA: Diagnosis not present

## 2024-07-10 DIAGNOSIS — I10 Essential (primary) hypertension: Secondary | ICD-10-CM | POA: Diagnosis not present

## 2024-07-10 DIAGNOSIS — F319 Bipolar disorder, unspecified: Secondary | ICD-10-CM | POA: Diagnosis not present

## 2024-07-10 DIAGNOSIS — Z79899 Other long term (current) drug therapy: Secondary | ICD-10-CM | POA: Diagnosis not present

## 2024-07-10 DIAGNOSIS — Z7984 Long term (current) use of oral hypoglycemic drugs: Secondary | ICD-10-CM | POA: Diagnosis not present

## 2024-07-10 DIAGNOSIS — Z83438 Family history of other disorder of lipoprotein metabolism and other lipidemia: Secondary | ICD-10-CM | POA: Diagnosis not present

## 2024-07-10 DIAGNOSIS — Z818 Family history of other mental and behavioral disorders: Secondary | ICD-10-CM | POA: Diagnosis not present

## 2024-07-10 DIAGNOSIS — Z96653 Presence of artificial knee joint, bilateral: Secondary | ICD-10-CM | POA: Diagnosis not present

## 2024-07-10 DIAGNOSIS — E785 Hyperlipidemia, unspecified: Secondary | ICD-10-CM | POA: Diagnosis not present

## 2024-07-10 DIAGNOSIS — Z8614 Personal history of Methicillin resistant Staphylococcus aureus infection: Secondary | ICD-10-CM | POA: Diagnosis not present

## 2024-07-10 DIAGNOSIS — I2510 Atherosclerotic heart disease of native coronary artery without angina pectoris: Secondary | ICD-10-CM | POA: Diagnosis not present

## 2024-07-10 DIAGNOSIS — N400 Enlarged prostate without lower urinary tract symptoms: Secondary | ICD-10-CM | POA: Diagnosis not present

## 2024-07-10 DIAGNOSIS — R55 Syncope and collapse: Secondary | ICD-10-CM | POA: Diagnosis not present

## 2024-07-10 DIAGNOSIS — Z888 Allergy status to other drugs, medicaments and biological substances status: Secondary | ICD-10-CM | POA: Diagnosis not present

## 2024-07-10 DIAGNOSIS — F419 Anxiety disorder, unspecified: Secondary | ICD-10-CM | POA: Diagnosis not present

## 2024-07-10 LAB — SURGICAL PCR SCREEN
MRSA, PCR: NEGATIVE
Staphylococcus aureus: NEGATIVE

## 2024-07-10 LAB — CBC
HCT: 42.1 % (ref 39.0–52.0)
Hemoglobin: 14.4 g/dL (ref 13.0–17.0)
MCH: 29.2 pg (ref 26.0–34.0)
MCHC: 34.2 g/dL (ref 30.0–36.0)
MCV: 85.4 fL (ref 80.0–100.0)
Platelets: 249 K/uL (ref 150–400)
RBC: 4.93 MIL/uL (ref 4.22–5.81)
RDW: 14 % (ref 11.5–15.5)
WBC: 5.5 K/uL (ref 4.0–10.5)
nRBC: 0 % (ref 0.0–0.2)

## 2024-07-10 LAB — GLUCOSE, CAPILLARY
Glucose-Capillary: 114 mg/dL — ABNORMAL HIGH (ref 70–99)
Glucose-Capillary: 119 mg/dL — ABNORMAL HIGH (ref 70–99)
Glucose-Capillary: 100 mg/dL — ABNORMAL HIGH (ref 70–99)
Glucose-Capillary: 98 mg/dL (ref 70–99)

## 2024-07-10 LAB — BASIC METABOLIC PANEL WITH GFR
Anion gap: 10 (ref 5–15)
BUN: 18 mg/dL (ref 8–23)
CO2: 23 mmol/L (ref 22–32)
Calcium: 9.2 mg/dL (ref 8.9–10.3)
Chloride: 106 mmol/L (ref 98–111)
Creatinine, Ser: 1.15 mg/dL (ref 0.61–1.24)
GFR, Estimated: >60 mL/min (ref >60)
Glucose, Bld: 110 mg/dL — ABNORMAL HIGH (ref 70–99)
Potassium: 3.9 mmol/L (ref 3.5–5.1)
Sodium: 139 mmol/L (ref 135–145)

## 2024-07-10 MED ORDER — CHLORHEXIDINE GLUCONATE 4 % EX SOLN
60.0000 mL | Freq: Once | CUTANEOUS | Status: AC
  Administered 2024-07-11: 4 via TOPICAL

## 2024-07-10 MED ORDER — SODIUM CHLORIDE 0.9% FLUSH
3.0000 mL | Freq: Two times a day (BID) | INTRAVENOUS | Status: DC
  Administered 2024-07-10 – 2024-07-12 (×4): 3 mL via INTRAVENOUS

## 2024-07-10 MED ORDER — SODIUM CHLORIDE 0.9 % IV SOLN
INTRAVENOUS | Status: DC
  Administered 2024-07-11: 50 mL via INTRAVENOUS

## 2024-07-10 MED ORDER — CHLORHEXIDINE GLUCONATE 4 % EX SOLN
60.0000 mL | Freq: Once | CUTANEOUS | Status: DC
  Filled 2024-07-10: qty 60

## 2024-07-10 MED ORDER — SODIUM CHLORIDE 0.9 % IV SOLN
80.0000 mg | INTRAVENOUS | Status: AC
  Administered 2024-07-11: 80 mg

## 2024-07-10 MED ORDER — CEFAZOLIN SODIUM-DEXTROSE 2-4 GM/100ML-% IV SOLN
2.0000 g | INTRAVENOUS | Status: AC
  Administered 2024-07-11: 2 g via INTRAVENOUS

## 2024-07-10 MED ORDER — SODIUM CHLORIDE 0.9% FLUSH: 3.0000 mL | INTRAVENOUS | Status: DC | PRN

## 2024-07-10 NOTE — Progress Notes (Signed)
  Patient Name: Sean Hill Date of Encounter: 07/10/2024  Primary Cardiologist: Lynwood Schilling, MD Electrophysiologist: None  Interval Summary   The patient is doing well today.  At this time, the patient denies chest pain, shortness of breath, or any new concerns.  Vital Signs    Vitals:   07/09/24 1607 07/09/24 2139 07/10/24 0040 07/10/24 0413  BP: (!) 142/72 (!) 123/54 111/67 102/63  Pulse: 71 79 83 88  Resp: 18 17 18 17   Temp: 97.9 F (36.6 C) 97.9 F (36.6 C) 97.9 F (36.6 C) 98.4 F (36.9 C)  TempSrc: Oral Oral Oral Oral  SpO2: 93% 100% 94% 94%  Weight:      Height:        Intake/Output Summary (Last 24 hours) at 07/10/2024 9342 Last data filed at 07/09/2024 2135 Gross per 24 hour  Intake 360 ml  Output 575 ml  Net -215 ml   Filed Weights   07/08/24 1432 07/09/24 1000  Weight: 116 kg 114.2 kg    Physical Exam    GEN- NAD, Alert and oriented  Lungs- Clear to ausculation bilaterally, normal work of breathing Cardiac- Regular rate and rhythm, no murmurs, rubs or gallops GI- soft, NT, ND, + BS Extremities- no clubbing or cyanosis. No edema  Telemetry    Sinus rhythm with first degree AVB, rates 60s-90s (personally reviewed)  Hospital Course    Sean Hill is a 83 y.o. male with a history of LBBB, hypertension, hyperlipidemia, GERD, bipolar disorder, anxiety, depression who is being seen today for the evaluation of near syncope, CHB at the request of Dr. Schilling. He presented to the ED yesterday with EMS after standing from a sitting position and developing dizziness with associated diaphoresis/flushing. He continued to feel lightheaded and dizzy prompting his spouse to check HR, found to be in the 40s. EMS activated.   Assessment & Plan    Symptomatic bradycardia Complete heart block LBBB   Patient with baseline/longstanding LBBB admitted after developing pre-syncope associated with bradycardia and transient complete heart block. On EMS  rhythm strips, RBBB escape morphology while in CHB. No recurrent arrhythmia since admission. No reversible causes. Reported fatigue and near syncopal events over the last year are consistent with transient CHB. Echocardiogram with preserved LVEF, paradoxical wall motion consistent with LBBB. Telemetry overnight without recurrent CHB. Plan for dual chamber PPM this admission, Thursday 10/23. Will make NPO after midnight tonight.      For questions or updates, please contact [facility] Please consult [email or web address] for contact info under     Signed, Artist Pouch, PA-C  07/10/2024, 6:57 AM

## 2024-07-10 NOTE — Progress Notes (Signed)
 Mobility Specialist Progress Note:   07/10/24 1138  Mobility  Activity Ambulated with assistance (In hallway)  Level of Assistance Standby assist, set-up cues, supervision of patient - no hands on  Assistive Device None  Distance Ambulated (ft) 210 ft  Activity Response Tolerated well  Mobility Referral Yes  Mobility visit 1 Mobility  Mobility Specialist Start Time (ACUTE ONLY) 0931  Mobility Specialist Stop Time (ACUTE ONLY) 0941  Mobility Specialist Time Calculation (min) (ACUTE ONLY) 10 min   Received pt in chair and agreeable to mobility. No physical assistance needed. HR <87 bpm. Returned to room without fault. Left pt in chair. Personal belongings and call light within reach. All needs met.  Lavanda Pollack Mobility Specialist  Please contact via [employer] or  Rehab Office [phone]

## 2024-07-10 NOTE — Plan of Care (Signed)
  Problem: Clinical Measurements: Goal: Will remain free from infection Outcome: Progressing Goal: Respiratory complications will improve Outcome: Progressing Goal: Cardiovascular complication will be avoided Outcome: Progressing   Problem: Elimination: Goal: Will not experience complications related to bowel motility Outcome: Progressing Goal: Will not experience complications related to urinary retention Outcome: Progressing   Problem: Tissue Perfusion: Goal: Adequacy of tissue perfusion will improve Outcome: Progressing

## 2024-07-10 NOTE — Care Management Obs Status (Signed)
 MEDICARE OBSERVATION STATUS NOTIFICATION   Patient Details  Name: Sean Hill MRN: [id number] Date of Birth: Mar 11, 1941   Medicare Observation Status Notification Given:  Yes    [name] 07/10/2024, 8:51 AM

## 2024-07-10 NOTE — Plan of Care (Signed)
  Problem: Nutrition: Goal: Adequate nutrition will be maintained Outcome: Progressing   Problem: Education: Goal: Understanding of medication regimen will improve Outcome: Progressing   Problem: Education: Goal: Knowledge of disease or condition will improve Outcome: Progressing

## 2024-07-11 ENCOUNTER — Inpatient Hospital Stay (HOSPITAL_COMMUNITY)
Admission: EM | Disposition: A | Discharge status: Home / Self Care | Payer: Self-pay | Source: Ambulatory Visit | Attending: Cardiology

## 2024-07-11 DIAGNOSIS — I442 Atrioventricular block, complete: Secondary | ICD-10-CM | POA: Diagnosis not present

## 2024-07-11 HISTORY — PX: PACEMAKER IMPLANT: EP1218

## 2024-07-11 LAB — BASIC METABOLIC PANEL WITH GFR
Anion gap: 9 (ref 5–15)
BUN: 15 mg/dL (ref 8–23)
CO2: 24 mmol/L (ref 22–32)
Calcium: 9.2 mg/dL (ref 8.9–10.3)
Chloride: 108 mmol/L (ref 98–111)
Creatinine, Ser: 1.14 mg/dL (ref 0.61–1.24)
GFR, Estimated: >60 mL/min (ref >60)
Glucose, Bld: 106 mg/dL — ABNORMAL HIGH (ref 70–99)
Potassium: 3.8 mmol/L (ref 3.5–5.1)
Sodium: 141 mmol/L (ref 135–145)

## 2024-07-11 LAB — CBC
HCT: 38.7 % — ABNORMAL LOW (ref 39.0–52.0)
Hemoglobin: 13.3 g/dL (ref 13.0–17.0)
MCH: 29.5 pg (ref 26.0–34.0)
MCHC: 34.4 g/dL (ref 30.0–36.0)
MCV: 85.8 fL (ref 80.0–100.0)
Platelets: 227 K/uL (ref 150–400)
RBC: 4.51 MIL/uL (ref 4.22–5.81)
RDW: 14 % (ref 11.5–15.5)
WBC: 5 K/uL (ref 4.0–10.5)
nRBC: 0 % (ref 0.0–0.2)

## 2024-07-11 LAB — GLUCOSE, CAPILLARY
Glucose-Capillary: 106 mg/dL — ABNORMAL HIGH (ref 70–99)
Glucose-Capillary: 108 mg/dL — ABNORMAL HIGH (ref 70–99)
Glucose-Capillary: 118 mg/dL — ABNORMAL HIGH (ref 70–99)
Glucose-Capillary: 101 mg/dL — ABNORMAL HIGH (ref 70–99)

## 2024-07-11 LAB — MAGNESIUM: Magnesium: 1.7 mg/dL (ref 1.7–2.4)

## 2024-07-11 SURGERY — PACEMAKER IMPLANT

## 2024-07-11 MED ORDER — LIDOCAINE HCL (PF) 1 % IJ SOLN
INTRAMUSCULAR | Status: AC
Start: 2024-07-11 — End: 2024-07-11
  Filled 2024-07-11: qty 60

## 2024-07-11 MED ORDER — MIDAZOLAM HCL 2 MG/2ML IJ SOLN
INTRAMUSCULAR | Status: AC
  Filled 2024-07-11: qty 2

## 2024-07-11 MED ORDER — HEPARIN (PORCINE) IN NACL 1000-0.9 UT/500ML-% IV SOLN
INTRAVENOUS | Status: DC | PRN
  Administered 2024-07-11: 500 mL

## 2024-07-11 MED ORDER — CEFAZOLIN SODIUM-DEXTROSE 2-4 GM/100ML-% IV SOLN
INTRAVENOUS | Status: AC
Start: 2024-07-11 — End: 2024-07-11
  Filled 2024-07-11: qty 100

## 2024-07-11 MED ORDER — SODIUM CHLORIDE 0.9 % IV SOLN
INTRAVENOUS | Status: AC
  Filled 2024-07-11: qty 2

## 2024-07-11 MED ORDER — MIDAZOLAM HCL 5 MG/5ML IJ SOLN
INTRAMUSCULAR | Status: DC | PRN
  Administered 2024-07-11: 1 mg via INTRAVENOUS

## 2024-07-11 MED ORDER — FENTANYL CITRATE (PF) 100 MCG/2ML IJ SOLN
INTRAMUSCULAR | Status: DC | PRN
  Administered 2024-07-11: 25 ug via INTRAVENOUS

## 2024-07-11 MED ORDER — MAGNESIUM SULFATE 2 GM/50ML IV SOLN
2.0000 g | Freq: Once | INTRAVENOUS | Status: AC
  Administered 2024-07-11: 2 g via INTRAVENOUS
  Filled 2024-07-11: qty 50

## 2024-07-11 MED ORDER — FENTANYL CITRATE (PF) 100 MCG/2ML IJ SOLN
INTRAMUSCULAR | Status: AC
  Filled 2024-07-11: qty 2

## 2024-07-11 SURGICAL SUPPLY — 11 items
CABLE SURGICAL S-101-97-12 (CABLE) ×1 IMPLANT
CATH SELECT PACE 669182 (CATHETERS) IMPLANT
CUTTER LV DELIVERY CATHETER 7 (MISCELLANEOUS) IMPLANT
LEAD INGEVITY 7841 52 (Lead) IMPLANT
LEAD INGEVITY 7842 59 (Lead) IMPLANT
PACEMAKER ACCOLADE DR-EL (Pacemaker) IMPLANT
PAD DEFIB RADIO PHYSIO CONN (PAD) ×1 IMPLANT
SHEATH 7FR PRELUDE SNAP 13 (SHEATH) IMPLANT
SHEATH 9FR PRELUDE SNAP 13 (SHEATH) IMPLANT
TRAY PACEMAKER INSERTION (PACKS) ×1 IMPLANT
WIRE HI TORQ VERSACORE-J 145CM (WIRE) IMPLANT

## 2024-07-11 NOTE — Plan of Care (Signed)
  Problem: Clinical Measurements: Goal: Will remain free from infection Outcome: Progressing   Problem: Nutrition: Goal: Adequate nutrition will be maintained Outcome: Progressing   Problem: Pain Managment: Goal: General experience of comfort will improve and/or be controlled Outcome: Progressing

## 2024-07-11 NOTE — Progress Notes (Signed)
 Mobility Specialist Progress Note:    07/11/24 1601  Mobility  Activity Ambulated with assistance (Assist from restroom)  Level of Assistance Contact guard assist, steadying assist  Assistive Device None  Distance Ambulated (ft) 10 ft  Activity Response Tolerated well  Mobility Referral Yes  Mobility visit 1 Mobility  Mobility Specialist Start Time (ACUTE ONLY) 1601  Mobility Specialist Stop Time (ACUTE ONLY) 1607  Mobility Specialist Time Calculation (min) (ACUTE ONLY) 6 min   Received request from NT to assist pt w/ restroom. No c/o any symptoms. Pt able to stand w/ light assist and ambulate w/ light assist. Returned pt to EOB w/ all needs met and family in room.   Venetia Keel Mobility Specialist Please Neurosurgeon or Rehab Office at [phone]

## 2024-07-11 NOTE — Plan of Care (Signed)
  Problem: Clinical Measurements: Goal: Will remain free from infection Outcome: Progressing Goal: Respiratory complications will improve Outcome: Progressing Goal: Cardiovascular complication will be avoided Outcome: Progressing   Problem: Activity: Goal: Risk for activity intolerance will decrease Outcome: Progressing   Problem: Coping: Goal: Level of anxiety will decrease Outcome: Progressing   Problem: Elimination: Goal: Will not experience complications related to bowel motility Outcome: Progressing Goal: Will not experience complications related to urinary retention Outcome: Progressing   Problem: Safety: Goal: Ability to remain free from injury will improve Outcome: Progressing

## 2024-07-11 NOTE — Progress Notes (Signed)
  Patient Name: Sean Hill Date of Encounter: 07/11/2024  Primary Cardiologist: Lynwood Schilling, MD Electrophysiologist: None  Interval Summary   The patient is doing well today.  At this time, the patient denies chest pain, shortness of breath, or any new concerns.  Vital Signs    Vitals:   07/10/24 2004 07/11/24 0013 07/11/24 0340 07/11/24 0520  BP: 126/60 124/70  136/73  Pulse: 62 73  79  Resp: 20 20  18   Temp: 98 F (36.7 C) 98 F (36.7 C)  97.6 F (36.4 C)  TempSrc: Oral Oral  Oral  SpO2: 91% 90%  92%  Weight:   115 kg   Height:        Intake/Output Summary (Last 24 hours) at 07/11/2024 0704 Last data filed at 07/11/2024 9364 Gross per 24 hour  Intake 175.81 ml  Output --  Net 175.81 ml   Filed Weights   07/08/24 1432 07/09/24 1000 07/11/24 0340  Weight: 116 kg 114.2 kg 115 kg    Physical Exam    GEN- NAD, Alert and oriented  Lungs- Clear to ausculation bilaterally, normal work of breathing Cardiac- Regular rate and rhythm, no murmurs, rubs or gallops GI- soft, NT, ND, + BS Extremities- no clubbing or cyanosis. No edema  Telemetry    Sinus rhythm, HR 60s-80s. No evidence of HB (personally reviewed)  Hospital Course    Sean Hill is a 83 y.o. male with a history of LBBB, hypertension, hyperlipidemia, GERD, bipolar disorder, anxiety, depression who is being seen today for the evaluation of near syncope, CHB at the request of Dr. Schilling. He presented to the ED yesterday with EMS after standing from a sitting position and developing dizziness with associated diaphoresis/flushing. He continued to feel lightheaded and dizzy prompting his spouse to check HR, found to be in the 40s. EMS activated.  Assessment & Plan    Symptomatic bradycardia Complete heart block Baseline LBBB  Patient with known LBBB admitted after presenting to the ED following an episode of presyncope at home. Patient noted to have HR in the 40s by his spouse and upon  activation of EMS, seen in CHB with RBBB escape morphology. Since admission, no reversible causes identified. TTE with preserved LVEF, paradoxical wall motion consistent with his LBBB. No recurrent CHB seen.  Patient on the schedule for dual chamber PPM today with Dr. Cindie.  Explained risks, benefits, and alternatives to PPM implantation, including but not limited to bleeding, infection, damage to heart or lungs, heart attack, stroke, or death.  Pt verbalized understanding and agrees to proceed.     For questions or updates, please contact [facility] Please consult [email or web address] for contact info under     Signed, Artist Pouch, PA-C  07/11/2024, 7:04 AM

## 2024-07-12 ENCOUNTER — Inpatient Hospital Stay (HOSPITAL_COMMUNITY)

## 2024-07-12 ENCOUNTER — Encounter (HOSPITAL_COMMUNITY): Payer: Self-pay | Admitting: Cardiology

## 2024-07-12 DIAGNOSIS — Z950 Presence of cardiac pacemaker: Secondary | ICD-10-CM | POA: Diagnosis not present

## 2024-07-12 DIAGNOSIS — Z96612 Presence of left artificial shoulder joint: Secondary | ICD-10-CM | POA: Diagnosis not present

## 2024-07-12 LAB — CBC
HCT: 38.7 % — ABNORMAL LOW (ref 39.0–52.0)
Hemoglobin: 13 g/dL (ref 13.0–17.0)
MCH: 28.9 pg (ref 26.0–34.0)
MCHC: 33.6 g/dL (ref 30.0–36.0)
MCV: 86 fL (ref 80.0–100.0)
Platelets: 226 K/uL (ref 150–400)
RBC: 4.5 MIL/uL (ref 4.22–5.81)
RDW: 13.9 % (ref 11.5–15.5)
WBC: 5.7 K/uL (ref 4.0–10.5)
nRBC: 0 % (ref 0.0–0.2)

## 2024-07-12 LAB — BASIC METABOLIC PANEL WITH GFR
Anion gap: 10 (ref 5–15)
BUN: 15 mg/dL (ref 8–23)
CO2: 23 mmol/L (ref 22–32)
Calcium: 8.7 mg/dL — ABNORMAL LOW (ref 8.9–10.3)
Chloride: 106 mmol/L (ref 98–111)
Creatinine, Ser: 1.19 mg/dL (ref 0.61–1.24)
GFR, Estimated: >60 mL/min (ref >60)
Glucose, Bld: 105 mg/dL — ABNORMAL HIGH (ref 70–99)
Potassium: 3.8 mmol/L (ref 3.5–5.1)
Sodium: 139 mmol/L (ref 135–145)

## 2024-07-12 LAB — GLUCOSE, CAPILLARY: Glucose-Capillary: 112 mg/dL — ABNORMAL HIGH (ref 70–99)

## 2024-07-12 MED FILL — Gentamicin Sulfate Inj 40 MG/ML: INTRAMUSCULAR | Qty: 80 | Status: CN

## 2024-07-12 MED FILL — Midazolam HCl Inj 2 MG/2ML (Base Equivalent): INTRAMUSCULAR | Qty: 1 | Status: CN

## 2024-07-12 MED FILL — Midazolam HCl Inj 2 MG/2ML (Base Equivalent): INTRAMUSCULAR | Qty: 1 | Status: AC

## 2024-07-12 MED FILL — Gentamicin Sulfate Inj 40 MG/ML: INTRAMUSCULAR | Qty: 80 | Status: AC

## 2024-07-12 MED FILL — Lidocaine HCl Local Preservative Free (PF) Inj 1%: INTRAMUSCULAR | Qty: 30 | Status: AC

## 2024-07-12 MED FILL — Lidocaine HCl Local Preservative Free (PF) Inj 1%: INTRAMUSCULAR | Qty: 30 | Status: CN

## 2024-07-12 NOTE — Plan of Care (Signed)
  Problem: Education: Goal: Knowledge of General Education information will improve Description: Including pain rating scale, medication(s)/side effects and non-pharmacologic comfort measures Outcome: Adequate for Discharge   Problem: Health Behavior/Discharge Planning: Goal: Ability to manage health-related needs will improve Outcome: Adequate for Discharge   Problem: Clinical Measurements: Goal: Ability to maintain clinical measurements within normal limits will improve Outcome: Adequate for Discharge Goal: Will remain free from infection Outcome: Adequate for Discharge Goal: Diagnostic test results will improve Outcome: Adequate for Discharge Goal: Respiratory complications will improve Outcome: Adequate for Discharge Goal: Cardiovascular complication will be avoided Outcome: Adequate for Discharge   Problem: Activity: Goal: Risk for activity intolerance will decrease Outcome: Adequate for Discharge   Problem: Nutrition: Goal: Adequate nutrition will be maintained Outcome: Adequate for Discharge   Problem: Coping: Goal: Level of anxiety will decrease Outcome: Adequate for Discharge   Problem: Elimination: Goal: Will not experience complications related to bowel motility Outcome: Adequate for Discharge Goal: Will not experience complications related to urinary retention Outcome: Adequate for Discharge   Problem: Pain Managment: Goal: General experience of comfort will improve and/or be controlled Outcome: Adequate for Discharge   Problem: Safety: Goal: Ability to remain free from injury will improve Outcome: Adequate for Discharge   Problem: Skin Integrity: Goal: Risk for impaired skin integrity will decrease Outcome: Adequate for Discharge   Problem: Education: Goal: Knowledge of disease or condition will improve Outcome: Adequate for Discharge Goal: Understanding of medication regimen will improve Outcome: Adequate for Discharge Goal: Individualized  Educational Video(s) Outcome: Adequate for Discharge   Problem: Activity: Goal: Ability to tolerate increased activity will improve Outcome: Adequate for Discharge   Problem: Cardiac: Goal: Ability to achieve and maintain adequate cardiopulmonary perfusion will improve Outcome: Adequate for Discharge   Problem: Health Behavior/Discharge Planning: Goal: Ability to safely manage health-related needs after discharge will improve Outcome: Adequate for Discharge   Problem: Education: Goal: Ability to describe self-care measures that may prevent or decrease complications (Diabetes Survival Skills Education) will improve Outcome: Adequate for Discharge Goal: Individualized Educational Video(s) Outcome: Adequate for Discharge   Problem: Coping: Goal: Ability to adjust to condition or change in health will improve Outcome: Adequate for Discharge   Problem: Fluid Volume: Goal: Ability to maintain a balanced intake and output will improve Outcome: Adequate for Discharge   Problem: Health Behavior/Discharge Planning: Goal: Ability to identify and utilize available resources and services will improve Outcome: Adequate for Discharge Goal: Ability to manage health-related needs will improve Outcome: Adequate for Discharge   Problem: Metabolic: Goal: Ability to maintain appropriate glucose levels will improve Outcome: Adequate for Discharge   Problem: Nutritional: Goal: Maintenance of adequate nutrition will improve Outcome: Adequate for Discharge Goal: Progress toward achieving an optimal weight will improve Outcome: Adequate for Discharge   Problem: Skin Integrity: Goal: Risk for impaired skin integrity will decrease Outcome: Adequate for Discharge   Problem: Tissue Perfusion: Goal: Adequacy of tissue perfusion will improve Outcome: Adequate for Discharge   Problem: Education: Goal: Knowledge of cardiac device and self-care will improve Outcome: Adequate for Discharge Goal:  Ability to safely manage health related needs after discharge will improve Outcome: Adequate for Discharge Goal: Individualized Educational Video(s) Outcome: Adequate for Discharge   Problem: Cardiac: Goal: Ability to achieve and maintain adequate cardiopulmonary perfusion will improve Outcome: Adequate for Discharge

## 2024-07-12 NOTE — TOC Transition Note (Addendum)
 Transition of Care [facility]) - Discharge Note   Patient Details  Name: Sean Hill MRN: [id number] Date of Birth: 1941/04/24  Transition of Care [facility]) CM/SW Contact:  Tom-Johnson, Harvest Muskrat, RN Phone Number: 07/12/2024, 9:30 AM   Clinical Narrative:     Patient is scheduled for discharge today.  Readmission Risk Assessment done. Outpatient f/u, hospital f/u and discharge instructions on AVS. No TOC/ICM needs or recommendations noted. Wife, [name] at bedside and will transport at discharge.  No further ICM needs noted.      Final next level of care: Home/Self Care Barriers to Discharge: Barriers Resolved   Patient Goals and CMS Choice Patient states their goals for this hospitalization and ongoing recovery are:: To return home CMS Medicare.gov Compare Post Acute Care list provided to:: Patient Choice offered to / list presented to : NA      Discharge Placement                Patient to be transferred to facility by: Wife Name of family member notified: [name]    Discharge Plan and Services Additional resources added to the After Visit Summary for                  DME Arranged: N/A DME Agency: NA       HH Arranged: NA HH Agency: NA        Social Drivers of Health (SDOH) Interventions SDOH Screenings   Food Insecurity: No Food Insecurity (07/09/2024)  Housing: Low Risk  (07/09/2024)  Transportation Needs: No Transportation Needs (07/09/2024)  Utilities: Not At Risk (07/09/2024)  Alcohol Screen: Low Risk  (01/02/2023)  Depression (PHQ2-9): Medium Risk (01/17/2023)  Financial Resource Strain: Low Risk  (04/03/2024)  Physical Activity: Inactive (04/03/2024)  Social Connections: Moderately Integrated (07/09/2024)  Stress: Stress Concern Present (04/03/2024)  Tobacco Use: Low Risk  (07/08/2024)     Readmission Risk Interventions    07/09/2024   10:30 AM  Readmission Risk Prevention Plan  Post Dischage Appt Complete  Medication  Screening Complete  Transportation Screening Complete

## 2024-07-12 NOTE — Progress Notes (Signed)
 Explained discharge instructions to patient. Reviewed follow up appointment and next medication administration times. Also reviewed education. Patient verbalized having an understanding for instructions given. All belongings are in the patient's possession. IV and telemetry were removed. CCMD was notified. No other needs verbalized. Volunteer will transport downstairs for discharge.

## 2024-07-12 NOTE — Discharge Instructions (Signed)
 After Your Pacemaker   You have a Environmental education officer  If you have a Medtronic or [employer]otron[employer] device, plug in your home monitor once you get home, and no manual interaction is required.   If you have an [employer] or [employer] device, plug your home monitor once you get home, sit near the device, and press the large activation button. Sit nearby until the process is complete, usually notated by lights on the monitor.   If you were set up for monitoring using an app on your phone, make sure the app remains open in the background and the Bluetooth remains on.  ACTIVITY Do not lift your arm above shoulder height for 1 week after your procedure. After 7 days, you may progress as below.  You should remove your sling 24 hours after your procedure, unless otherwise instructed by your provider.     Friday July 19, 2024  Saturday July 20, 2024 Sunday July 21, 2024 Monday July 22, 2024   Do not lift, push, pull, or carry anything over 10 pounds with the affected arm until 6 weeks (Friday August 23, 2024 ) after your procedure.   You may drive AFTER your wound check, unless you have been told otherwise by your provider.   Ask your healthcare provider when you can go back to work   INCISION/Dressing If you are on a blood thinner such as Coumadin, Xarelto, Eliquis, Plavix, or Pradaxa please confirm with your provider when this should be resumed.   If large square, outer bandage is left in place, this can be removed after 24 hours from your procedure. Do not remove steri-strips or glue as below.   If a PRESSURE DRESSING (a bulky dressing that usually goes up over your shoulder) was applied or left in place, please follow instructions given by your provider on when to return to have this removed.   Monitor your Pacemaker site for redness, swelling, and drainage. Call the device clinic at [phone] if you experience these symptoms or fever/chills.  If your incision is  sealed with Steri-strips or staples, you may shower 7 days after your procedure or when told by your provider. Do not remove the steri-strips or let the shower hit directly on your site. You may wash around your site with soap and water .    If you were discharged in a sling, please do not wear this during the day more than 48 hours after your surgery unless otherwise instructed. This may increase the risk of stiffness and soreness in your shoulder.   Avoid lotions, ointments, or perfumes over your incision until it is well-healed.  You may use a hot tub or a pool AFTER your wound check appointment if the incision is completely closed.  Pacemaker Alerts:  Some alerts are vibratory and others beep. These are NOT emergencies. Please call our office to let us  know. If this occurs at night or on weekends, it can wait until the next business day. Send a remote transmission.  If your device is capable of reading fluid status (for heart failure), you will be offered monthly monitoring to review this with you.   DEVICE MANAGEMENT Remote monitoring is used to monitor your pacemaker from home. This monitoring is scheduled every 91 days by our office. It allows us  to keep an eye on the functioning of your device to ensure it is working properly. You will routinely see your Electrophysiologist annually (more often if necessary).  This will appear as a REMOTE check on  your MyChart schedule. These are automatic and there is nothing for you to manually do unless otherwise instructed.  You should receive your ID card for your new device in 4-8 weeks. Keep this card with you at all times once received. Consider wearing a medical alert bracelet or necklace.  Your Pacemaker may be MRI compatible. This will be discussed at your next office visit/wound check.  You should avoid contact with strong electric or magnetic fields.   Do not use amateur (ham) radio equipment or electric (arc) welding torches. MP3 player  headphones with magnets should not be used. Some devices are safe to use if held at least 12 inches (30 cm) from your Pacemaker. These include power tools, lawn mowers, and speakers. If you are unsure if something is safe to use, ask your health care provider.  When using your cell phone, hold it to the ear that is on the opposite side from the Pacemaker. Do not leave your cell phone in a pocket over the Pacemaker.  You may safely use electric blankets, heating pads, computers, and microwave ovens.  Call the office right away if: You have chest pain. You feel more short of breath than you have felt before. You feel more light-headed than you have felt before. Your incision starts to open up.  This information is not intended to replace advice given to you by your health care provider. Make sure you discuss any questions you have with your health care provider.

## 2024-07-12 NOTE — Discharge Summary (Signed)
 ELECTROPHYSIOLOGY PROCEDURE DISCHARGE SUMMARY    Patient ID: Sean Hill,  MRN: [id number], DOB/AGE: 11/23/1940 83 y.o.  Admit date: 07/08/2024 Discharge date: 07/12/2024  Primary Care Physician: Merna Huxley, NP  Primary Cardiologist: Lynwood Schilling, MD  Electrophysiologist: Dr. 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 No Did it involve sudden or severe rash/hives, skin peeling, or any reaction on the inside of your mouth or nose? No Did you need to seek medical attention at a hospital or doctor's office? No When did it last happen? around 1980    If all above answers are NO, may proceed with cephalosporin use. Tolerated Cephalosporin Date: 08/26/21.     Lisinopril  Diarrhea, Other (See Comments) and Cough    Dry cough, abdominal bloating and diarrhea   Zetia  [Ezetimibe ]     Myalgia      Procedures This Admission:  1.  Implantation of a Administrator. See procedure note for details. There were no immediate post procedure complications.   2.  CXR on 07/12/2024 demonstrated no pneumothorax status post device implantation.       Brief HPI: Sean Hill is a 83 y.o. male was admitted for symptomatic complete heart block and electrophysiology team asked to see for consideration of PPM implantation.  Past medical history includes LBBB, hypertension, hyperlipidemia.  The patient has had complete heart block without reversible causes identified.  Risks, benefits, and alternatives to PPM implantation were reviewed with the patient who wished to proceed.   Hospital Course:  The patient was admitted and underwent implantation of a [employer]  dual chamber PPM with details as outlined above.  He was monitored on telemetry overnight which demonstrated NSR.  Left chest was without hematoma or ecchymosis.  The device was interrogated and found to be functioning normally.  CXR was obtained and demonstrated no pneumothorax status post device implantation.  Wound care, arm mobility, and restrictions were reviewed with the patient.  The patient was examined and considered stable for discharge to home.    Anticoagulation resumption This patient is not on anticoagulation    Physical Exam: Vitals:   07/11/24 2104 07/12/24 0006 07/12/24 0611 07/12/24 0729  BP: 131/65 118/64 (!) 125/55 (!) 143/74  Pulse: 65 75 67 63  Resp: 20  20 18   Temp: 98 F (36.7 C) 98.4 F (36.9 C) 97.9 F (36.6 C) 98.3 F (36.8 C)  TempSrc: Oral Oral Oral Oral  SpO2: 92% 92% 95% 94%  Weight:   114.6 kg   Height:        GEN- NAD. A&O x 3.  HEENT: Normocephalic, atraumatic Lungs- CTAB, Normal effort.  Heart- RRR, No M/G/R.  GI- Soft, NT, ND.  Extremities- No clubbing, cyanosis, or edema;  Skin- warm and dry, no rash or lesion, left chest without hematoma/ecchymosis  Discharge Medications:  Allergies as of 07/12/2024       Reactions   Penicillins Other (See Comments)   BLISTERS Did it involve swelling of the face/tongue/throat, SOB, or low BP? No Did it involve sudden or severe rash/hives, skin peeling, or any reaction on the inside of your mouth or nose? No Did you need to seek medical attention at a hospital or doctor's office? No When did it last  happen? around 1980    If all above answers are NO, may proceed with cephalosporin use. Tolerated Cephalosporin Date: 08/26/21.   Lisinopril  Diarrhea, Other (See Comments), Cough   Dry cough, abdominal bloating and diarrhea   Zetia  [ezetimibe ]    Myalgia         Medication List     TAKE these medications    albuterol  108 (90 Base) MCG/ACT inhaler Commonly known as: VENTOLIN  HFA Inhale 2 puffs  into the lungs every 6 (six) hours as needed for wheezing or shortness of breath.   ARIPiprazole  ER 400 MG Prsy prefilled syringe Commonly known as: ABILIFY  MAINTENA Inject 400 mg into the muscle every 28 (twenty-eight) days.   azelastine  0.1 % nasal spray Commonly known as: ASTELIN  Place 2 sprays into both nostrils 2 (two) times daily. Use in each nostril as directed What changed:  when to take this reasons to take this additional instructions   cetirizine  10 MG tablet Commonly known as: ZYRTEC  TAKE 1 TABLET (10 MG TOTAL) BY MOUTH EVERY MORNING.   chlorhexidine  4 % external liquid Commonly known as: HIBICLENS  Apply 15 mLs (1 Application total) topically as directed for 30 doses. Use as directed daily for 5 days every other week for 6 weeks.   citalopram  20 MG tablet Commonly known as: CeleXA  Take 1 tablet (20 mg total) by mouth daily.   fenofibrate  145 MG tablet Commonly known as: TRICOR  TAKE 1 TABLET BY MOUTH EVERY DAY   finasteride  5 MG tablet Commonly known as: PROSCAR  TAKE 1 TABLET (5 MG TOTAL) BY MOUTH DAILY.   HYDROcodone  bit-homatropine 5-1.5 MG/5ML syrup Commonly known as: HYCODAN Take 5 mLs by mouth every 8 (eight) hours as needed for cough.   ipratropium-albuterol  0.5-2.5 (3) MG/3ML Soln Commonly known as: DUONEB INHALE 3 ML BY NEBULIZER EVERY 6 HOURS AS NEEDED What changed: See the new instructions.   losartan  50 MG tablet Commonly known as: COZAAR  TAKE 1 TABLET BY MOUTH EVERY DAY IN THE MORNING   metFORMIN  500 MG tablet Commonly known as: GLUCOPHAGE  TAKE 1 TABLET (500 MG TOTAL) BY MOUTH DAILY. What changed: when to take this   multivitamin with minerals Tabs tablet Take 1 tablet by mouth daily with breakfast.   pantoprazole  40 MG tablet Commonly known as: PROTONIX  Take 1 tablet (40 mg total) by mouth 2 (two) times daily.   PRESERVISION AREDS 2 PO Take 1 capsule by mouth 2 (two) times daily.   tamsulosin  0.4 MG Caps capsule Commonly known as:  FLOMAX  TAKE 1 CAPSULE BY MOUTH EVERY DAY   temazepam  15 MG capsule Commonly known as: RESTORIL  Take 1 capsule (15 mg total) by mouth at bedtime.   Vascepa  1 g capsule Generic drug: icosapent  Ethyl TAKE 2 CAPSULES BY MOUTH TWICE DAILY        Disposition:  Home with usual follow up as in AVS   Duration of Discharge Encounter:  APP time: 20 minutes  Signed, Artist Pouch, PA-C  07/12/2024 9:07 AM

## 2024-07-15 ENCOUNTER — Telehealth: Payer: Self-pay

## 2024-07-15 ENCOUNTER — Encounter: Payer: Self-pay | Admitting: Emergency Medicine

## 2024-07-15 ENCOUNTER — Other Ambulatory Visit: Payer: Self-pay | Admitting: Medical Genetics

## 2024-07-15 DIAGNOSIS — Z006 Encounter for examination for normal comparison and control in clinical research program: Secondary | ICD-10-CM

## 2024-07-15 NOTE — Telephone Encounter (Signed)
 Follow-up after same day discharge: Implant date: 10/23/025 MD: lambert Device: ppm bsx Location: L chest    Wound check visit: 07/24/2024 90 day MD follow-up: 10/10/2024  Remote Transmission received:yes  Dressing/sling removed: n/a  Confirm OAC restart on: yes   Please continue to monitor your cardiac device site for redness, swelling, and drainage. Call the device clinic at [phone] if you experience these symptoms, fever/chills, or have questions about your device.   Remote monitoring is used to monitor your cardiac device from home. This monitoring is scheduled every 91 days by our office. It allows us  to keep an eye on the functioning of your device to ensure it is working properly.

## 2024-07-15 NOTE — Transitions of Care (Post Inpatient/ED Visit) (Signed)
 07/15/2024  Name: Sean Hill MRN: [id number] DOB: 04/28/1941  Today's TOC FU Call Status: Today's TOC FU Call Status:: Successful TOC FU Call Completed TOC FU Call Complete Date: 07/15/24 Patient's Name and Date of Birth confirmed.  Transition Care Management Follow-up Telephone Call Date of Discharge: 07/12/24 Discharge Facility: [facility]) Type of Discharge: Inpatient Admission Primary Inpatient Discharge Diagnosis:: Symptomatic bradycardia status post pacemaker implantation this admission How have you been since you were released from the hospital?: Better (feeling fine has come chest congestion) Any questions or concerns?: No  Items Reviewed: Did you receive and understand the discharge instructions provided?: Yes Medications obtained,verified, and reconciled?: Yes (Medications Reviewed) Any new allergies since your discharge?: No Dietary orders reviewed?: NA Do you have support at home?: Yes People in Home [RPT]: spouse Name of Support/Comfort Primary Source: Joen  Medications Reviewed Today: Medications Reviewed Today     Reviewed by Rumalda Alan PENNER, RN (Registered Nurse) on 07/15/24 at 1147  Med List Status: <None>   Medication Order Taking? Sig Documenting Provider Last Dose Status Informant  albuterol  (VENTOLIN  HFA) 108 (90 Base) MCG/ACT inhaler [id number] Yes Inhale 2 puffs into the lungs every 6 (six) hours as needed for wheezing or shortness of breath. Nafziger, Darleene, NP  Active Self  ARIPiprazole  ER (ABILIFY  MAINTENA) 400 MG prefilled syringe 400 mg [id number]    Patient taking differently: Inject 400 mg into the muscle every 28 (twenty-eight) days. Reconstitute with the attached diluent  before use.   Tasia Lung, MD  Active   ARIPiprazole  ER (ABILIFY  MAINTENA) 400 MG PRSY prefilled syringe [id number] Yes Inject 400 mg into the muscle every 28 (twenty-eight) days. Arfeen, Syed T, MD  Active Self  azelastine  (ASTELIN ) 0.1 % nasal spray [id number] Yes  Place 2 sprays into both nostrils 2 (two) times daily. Use in each nostril as directed Merna Darleene, NP  Active Self  cetirizine  (ZYRTEC ) 10 MG tablet [id number] Yes TAKE 1 TABLET (10 MG TOTAL) BY MOUTH EVERY MORNING. Nafziger, Darleene, NP  Active Self  chlorhexidine  (HIBICLENS ) 4 % external liquid [id number]  Apply 15 mLs (1 Application total) topically as directed for 30 doses. Use as directed daily for 5 days every other week for 6 weeks.  Patient not taking: Reported on 07/15/2024   Kay Kemps, MD  Active Self  citalopram  (CELEXA ) 20 MG tablet [id number] Yes Take 1 tablet (20 mg total) by mouth daily. Arfeen, Syed T, MD  Active Self  fenofibrate  (TRICOR ) 145 MG tablet [id number] Yes TAKE 1 TABLET BY MOUTH EVERY DAY Nafziger, Darleene, NP  Active Self  finasteride  (PROSCAR ) 5 MG tablet [id number] Yes TAKE 1 TABLET (5 MG TOTAL) BY MOUTH DAILY. Nafziger, Darleene, NP  Active Self  HYDROcodone  bit-homatropine (HYCODAN) 5-1.5 MG/5ML syrup [id number]  Take 5 mLs by mouth every 8 (eight) hours as needed for cough.  Patient not taking: Reported on 07/15/2024   Nafziger, Cory, NP  Active Self  ipratropium-albuterol  (DUONEB) 0.5-2.5 (3) MG/3ML SOLN [id number] Yes INHALE 3 ML BY NEBULIZER EVERY 6 HOURS AS NEEDED Nafziger, Darleene, NP  Active Self  losartan  (COZAAR ) 50 MG tablet [id number] Yes TAKE 1 TABLET BY MOUTH EVERY DAY IN THE MORNING Nafziger, Cory, NP  Active Self  metFORMIN  (GLUCOPHAGE ) 500 MG tablet [id number] Yes TAKE 1 TABLET (500 MG TOTAL) BY MOUTH DAILY. Nafziger, Darleene, NP  Active Self  Multiple Vitamin (MULTIVITAMIN WITH MINERALS) TABS tablet [id number] Yes Take 1 tablet by mouth daily with breakfast. [provider]  Active Self  Multiple  Vitamins-Minerals (PRESERVISION AREDS 2 PO) [id number] Yes Take 1 capsule by mouth 2 (two) times daily. [provider]  Active Self  pantoprazole  (PROTONIX ) 40 MG tablet [id number] Yes Take 1 tablet (40 mg total) by mouth 2 (two) times daily. Cirigliano, Vito  V, DO  Active Self  tamsulosin  (FLOMAX ) 0.4 MG CAPS capsule [id number] Yes TAKE 1 CAPSULE BY MOUTH EVERY DAY Nafziger, Darleene, NP  Active Self  temazepam  (RESTORIL ) 15 MG capsule [id number] Yes Take 1 capsule (15 mg total) by mouth at bedtime. Curry Leni DASEN, MD  Active Self  VASCEPA  1 g capsule [id number] Yes TAKE 2 CAPSULES BY MOUTH TWICE DAILY Nafziger, Darleene, NP  Active Self            Home Care and Equipment/Supplies: Were Home Health Services Ordered?: No Any new equipment or medical supplies ordered?: No  Functional Questionnaire: Do you need assistance with bathing/showering or dressing?: Yes (wife assist) Do you need assistance with meal preparation?: Yes (wife assist) Do you need assistance with eating?: No Do you have difficulty maintaining continence: No Do you need assistance with getting out of bed/getting out of a chair/moving?: No Do you have difficulty managing or taking your medications?: No  Follow up appointments reviewed: PCP Follow-up appointment confirmed?: Yes Date of PCP follow-up appointment?: 07/16/24 Follow-up Provider: PCP Specialist Hospital Follow-up appointment confirmed?: Yes Date of Specialist follow-up appointment?: 07/24/24 Follow-Up Specialty Provider:: Cardiology Do you need transportation to your follow-up appointment?: No Do you understand care options if your condition(s) worsen?: Yes-patient verbalized understanding  SDOH Interventions Today    Flowsheet Row Most Recent Value  SDOH Interventions   Food Insecurity Interventions Intervention Not Indicated  Housing Interventions Intervention Not Indicated  Transportation Interventions Intervention Not Indicated  Utilities Interventions Intervention Not Indicated   Patient reports that he is doing well. Reports that he has chest congestion and is seeing PCP tomorrow.  Reports that he is using his nebulizer and inhaler as needed.  Denies fever.  Interventions:  Reviewed all  medications Reviewed follow up appointments. Reviewed incision care  Reviewed S/S of infection Reviewed when to call 911.  Confirmed that patient has remote device for checking his pacemaker. Reviewed activity restrictions and arm movement restrictions.  Confirm patient has chosen to wear his sling to prevent raising his arm too high.   Reviewed and offered 30 day TOC program and patient has declined. Provided my contact information for patient to call me if needed.   Alan Ee, RN, BSN, CEN [employer]- Transition of Care Team.  [facility] [phone]

## 2024-07-16 ENCOUNTER — Encounter: Payer: Self-pay | Admitting: Adult Health

## 2024-07-16 ENCOUNTER — Ambulatory Visit: Admitting: Adult Health

## 2024-07-16 VITALS — BP 130/60 | HR 83 | Temp 98.2°F | Ht 70.0 in | Wt 254.0 lb

## 2024-07-16 DIAGNOSIS — E785 Hyperlipidemia, unspecified: Secondary | ICD-10-CM | POA: Diagnosis not present

## 2024-07-16 DIAGNOSIS — R053 Chronic cough: Secondary | ICD-10-CM

## 2024-07-16 DIAGNOSIS — R0609 Other forms of dyspnea: Secondary | ICD-10-CM | POA: Diagnosis not present

## 2024-07-16 DIAGNOSIS — R42 Dizziness and giddiness: Secondary | ICD-10-CM | POA: Diagnosis not present

## 2024-07-16 DIAGNOSIS — R7303 Prediabetes: Secondary | ICD-10-CM

## 2024-07-16 DIAGNOSIS — I10 Essential (primary) hypertension: Secondary | ICD-10-CM

## 2024-07-16 DIAGNOSIS — R55 Syncope and collapse: Secondary | ICD-10-CM

## 2024-07-16 NOTE — Progress Notes (Signed)
 Subjective:    Patient ID: Sean Hill, male    DOB: [birth year], 83 y.o.   MRN: [id number]  HPI 83 year old male who  has a past medical history of Anal fissure, Anxiety, Arthritis, Asthma, Bipolar disorder (HCC), Cataract, Depression, Difficult airway for intubation, GERD (gastroesophageal reflux disease), Hemorrhage of colon following colonoscopy (10/08/2019), History of kidney stones, History of MRSA infection, Hyperlipidemia, Hypertension, Impaired hearing, Left bundle branch block (LBBB) on electrocardiogram (10/29/2015), Macular degeneration (senile) of retina, and Pre-diabetes.  He presents to the office today for TCM visit  Admit Date: 07/08/2024 Discharge Date 07/12/2024  He presented to the emergency room after he stood up at home from a sitting position and felt dizzy and diaphoretic.  He did have an episode of chest tightness.  Per EMS his heart rate is in the 40s.  In the ER his blood pressure is 121/72 with a heart rate of 63.  EKG in the shows sinus rhythm with LBBB ER 6 7 which was unchanged from previous.  His chest x-ray was unremarkable.  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  Hypertension  - His losartan  was reduced to 25 mg as he had been working on weight loss through healthy eating to lose 25 pounds, will stop that due to the weight loss.  Overtreated his blood pressure medication  Dyslipidemia  - Continue on fenofibrate  and Vascepa   Chronic DOE  - BNP was at baseline  - Chest was unremarkable   Today he reports that since he was discharged from the hospital he has been feeling better overall. He denies fevers, chills, or chest pain. He does not remember being told to reduce his losartan  to 25 mg daily. Yesterday he got up too quickly and felt like he  was going to pass out but never did and this resolved pretty quickly.   He has an appointment with Cardiology next week.   He does reports that he has developed a cough that he usually gets this time of year with associated wheezing. He has been using his nebulizer and this seems to help, today the cough is better and he is not wheezing.    Review of Systems  Constitutional: Negative.   HENT: Negative.    Eyes: Negative.   Respiratory:  Positive for cough, shortness of breath and wheezing.   Cardiovascular: Negative.   Gastrointestinal: Negative.   Endocrine: Negative.   Genitourinary: Negative.   Musculoskeletal: Negative.   Skin: Negative.   Neurological: Negative.   Hematological: Negative.   Psychiatric/Behavioral: Negative.    All other systems reviewed and are negative.  Past Medical History:  Diagnosis Date   Anal fissure    Anxiety    Arthritis    right wrist; pt. 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 x 1 yr.   Impaired hearing    Left bundle  branch block (LBBB) on electrocardiogram 10/29/2015   Macular degeneration (senile) of retina    left   Pre-diabetes     Social History   Socioeconomic History   Marital status: Married    Spouse name: Not on file   Number of children: Not on file   Years of education: Not on file   Highest education level: Associate degree: occupational, scientist, product/process development, or vocational program  Occupational History   Not on file  Tobacco Use   Smoking status: Never   Smokeless tobacco: Never  Vaping Use   Vaping status: Never Used  Substance and Sexual Activity   Alcohol use: Not  Currently    Comment: occasional.   Drug use: No   Sexual activity: Not Currently    Birth control/protection: None  Other Topics Concern   Not on file  Social History Narrative   Married, lives in [place].  Retired quarry manager.   Two daughters, both married,3 grandchildren    No regular exercise.  Never smoker.  No ETOH.  No drugs.   Social Drivers of Corporate Investment Banker Strain: Low Risk  (04/03/2024)   Overall Financial Resource Strain (CARDIA)    Difficulty of Paying Living Expenses: Not hard at all  Food Insecurity: No Food Insecurity (07/15/2024)   Hunger Vital Sign    Worried About Running Out of Food in the Last Year: Never true    Ran Out of Food in the Last Year: Never true  Transportation Needs: No Transportation Needs (07/15/2024)   PRAPARE - Administrator, Civil Service (Medical): No    Lack of Transportation (Non-Medical): No  Physical Activity: Inactive (04/03/2024)   Exercise Vital Sign    Days of Exercise per Week: 0 days    Minutes of Exercise per Session: Not on file  Stress: Stress Concern Present (04/03/2024)   [employer] of Occupational Health - Occupational Stress Questionnaire    Feeling of Stress: To some extent  Social Connections: Moderately Integrated (07/09/2024)   Social Connection and Isolation Panel    Frequency of Communication with Friends and Family: More than three times a week    Frequency of Social Gatherings with Friends and Family: More than three times a week    Attends Religious Services: Never    Database Administrator or Organizations: No    Attends Engineer, Structural: More than 4 times per year    Marital Status: Married  Catering Manager Violence: Not At Risk (07/15/2024)   Humiliation, Afraid, Rape, and Kick questionnaire    Fear of Current or Ex-Partner: No    Emotionally Abused: No    Physically Abused: No    Sexually Abused: No    Past Surgical History:  Procedure Laterality Date    ANKLE FUSION Bilateral    ANKLE SURGERY Bilateral    ligament surgery   BIOPSY  04/29/2020   Procedure: BIOPSY;  Surgeon: San Sandor GAILS, DO;  Location: [facility];  Service: Gastroenterology;;   BIOPSY  07/14/2022   Procedure: BIOPSY;  Surgeon: San Sandor GAILS, DO;  Location: [facility];  Service: Gastroenterology;;   CARDIAC CATHETERIZATION  x 2   1983; 11/14/2003   CARPOMETACARPEL SUSPENSION PLASTY Right 09/22/2016   Procedure: right thumb carpometacarpal  ARTHROPLASTY;  Surgeon: Franky Curia, MD;  Location: [facility];  Service: Orthopedics;  Laterality: Right;  right thumb carpometacarpal  ARTHROPLASTY   CARPOMETACARPEL SUSPENSION PLASTY Right 11/02/2017   Procedure: RIGHT THUMB SUSPENSIONPLASTY WITH TIGHTROPE,  DISTAL POLE SCAPHOID  EXCISION;  Surgeon: Murrell Drivers, MD;  Location: [facility];  Service: Orthopedics;  Laterality: Right;   COLONOSCOPY WITH PROPOFOL  N/A 10/02/2019   Procedure: COLONOSCOPY WITH PROPOFOL ;  Surgeon: San Sandor GAILS, DO;  Location: [facility];  Service: Gastroenterology;  Laterality: N/A;   COLONOSCOPY WITH PROPOFOL  N/A 10/09/2019   Procedure: COLONOSCOPY WITH PROPOFOL ;  Surgeon: Avram Lupita BRAVO, MD;  Location: [facility];  Service: Endoscopy;  Laterality: N/A;   COLONOSCOPY WITH PROPOFOL  N/A 04/29/2020   Procedure: COLONOSCOPY WITH PROPOFOL ;  Surgeon: San Sandor GAILS, DO;  Location: [facility];  Service: Gastroenterology;  Laterality: N/A;   COLONOSCOPY WITH PROPOFOL  N/A 11/11/2021   Procedure: COLONOSCOPY WITH PROPOFOL ;  Surgeon: San Sandor GAILS, DO;  Location: [facility];  Service: Gastroenterology;  Laterality: N/A;   ELBOW SURGERY Left    ENDOSCOPIC MUCOSAL RESECTION N/A 10/02/2019   Procedure: ENDOSCOPIC MUCOSAL RESECTION;  Surgeon: San Sandor GAILS, DO;  Location: [facility];  Service: Gastroenterology;  Laterality: N/A;   ESOPHAGOGASTRODUODENOSCOPY (EGD) WITH PROPOFOL  N/A 07/14/2022    Procedure: ESOPHAGOGASTRODUODENOSCOPY (EGD) WITH PROPOFOL ;  Surgeon: San Sandor GAILS, DO;  Location: [facility];  Service: Gastroenterology;  Laterality: N/A;   HEMOSTASIS CLIP PLACEMENT  10/09/2019   Procedure: HEMOSTASIS CLIP PLACEMENT;  Surgeon: Avram Lupita BRAVO, MD;  Location: [facility];  Service: Endoscopy;;   HEMOSTASIS CLIP PLACEMENT  11/11/2021   Procedure: HEMOSTASIS CLIP PLACEMENT;  Surgeon: San Sandor GAILS, DO;  Location: [facility];  Service: Gastroenterology;;   HEMOSTASIS CLIP PLACEMENT  07/14/2022   Procedure: HEMOSTASIS CLIP PLACEMENT;  Surgeon: San Sandor GAILS, DO;  Location: [facility];  Service: Gastroenterology;;   INGUINAL HERNIA REPAIR Right    KNEE SURGERY     OPEN REDUCTION INTERNAL FIXATION (ORIF) DISTAL RADIAL FRACTURE Right 10/29/2015   Procedure: OPEN REDUCTION INTERNAL FIXATION (ORIF) DISTAL RADIAL FRACTURE;  Surgeon: Drivers Murrell, MD;  Location: [facility];  Service: Orthopedics;  Laterality: Right;   ORIF ELBOW FRACTURE Right    PACEMAKER IMPLANT N/A 07/11/2024   Procedure: PACEMAKER IMPLANT;  Surgeon: Cindie Ole DASEN, MD;  Location: [facility];  Service: Cardiovascular;  Laterality: N/A;   POLYPECTOMY  04/29/2020   Procedure: POLYPECTOMY;  Surgeon: San Sandor GAILS, DO;  Location: [facility];  Service: Gastroenterology;;   POLYPECTOMY  11/11/2021   Procedure: POLYPECTOMY;  Surgeon: San Sandor GAILS, DO;  Location: [facility];  Service: Gastroenterology;;   POLYPECTOMY  07/14/2022   Procedure: POLYPECTOMY;  Surgeon: San Sandor GAILS, DO;  Location: [facility];  Service: Gastroenterology;;   REVERSE SHOULDER ARTHROPLASTY Left 11/10/2023   Procedure: REVERSE SHOULDER ARTHROPLASTY;  Surgeon: Kay Kemps, MD;  Location: [facility];  Service: Orthopedics;  Laterality: Left;  interscalene block, flip room   SAVORY DILATION N/A 07/14/2022   Procedure: SAVORY DILATION;  Surgeon: San Sandor GAILS, DO;  Location: [facility];  Service: Gastroenterology;  Laterality: N/A;   SHOULDER ARTHROSCOPY     SUBMUCOSAL LIFTING INJECTION  10/02/2019   Procedure: SUBMUCOSAL LIFTING INJECTION;  Surgeon: San Sandor GAILS, DO;  Location: [facility];  Service: Gastroenterology;;   TONSILLECTOMY     TOTAL KNEE ARTHROPLASTY Bilateral    TOTAL KNEE REVISION Right 08/25/2021   Procedure: Right knee polyethylene vs total knee arthroplasty revision;  Surgeon: Melodi Lerner, MD;  Location: [facility];  Service: Orthopedics;  Laterality: Right;   ULNAR COLLATERAL LIGAMENT REPAIR Right 12/29/2015   Procedure: REPAIR  RIGHT LATERAL ULNAR COLLATERAL LIGAMENT TEAR  EXTENSOR ORIGIN ;  Surgeon: Franky Curia, MD;  Location: [facility];  Service: Orthopedics;  Laterality: Right;   WRIST ARTHROSCOPY WITH DEBRIDEMENT Right 07/14/2016   Procedure: RIGHT WRIST ARTHROSCOPY WITH DEBRIDEMENT  TRIANGULAR FIBROCARTILAGE COMPLEX;  Surgeon: Franky Curia, MD;  Location: [facility];  Service: Orthopedics;  Laterality: Right;    Family History  Problem Relation Age of Onset   Alcohol abuse Mother    Ovarian cancer Mother    Alcohol abuse Father    Pancreatic cancer Father    Hyperlipidemia Brother    Barrett's esophagus Brother    Colon cancer Maternal Grandmother    Depression Daughter    Paranoid behavior Daughter    Esophageal cancer Neg Hx    Stomach cancer Neg Hx    Rectal cancer Neg Hx    Colon polyps Neg Hx     Allergies  Allergen Reactions   Penicillins Other (See Comments)    BLISTERS Did it involve swelling of the face/tongue/throat, SOB, or low BP? No Did it involve sudden or severe rash/hives, skin peeling, or any reaction on the inside of your mouth or nose? No Did you need to seek medical attention at a hospital or doctor's office? No When did it last happen? around 1980    If all above answers are NO, may proceed with cephalosporin use. Tolerated Cephalosporin Date: 08/26/21.      Lisinopril  Diarrhea, Other (See Comments) and Cough    Dry cough, abdominal bloating and diarrhea   Zetia  [Ezetimibe ]     Myalgia     Current Outpatient Medications on File Prior to Visit  Medication Sig Dispense Refill   albuterol  (VENTOLIN  HFA) 108 (90 Base) MCG/ACT inhaler Inhale 2 puffs into the lungs every 6 (six) hours as needed for wheezing or shortness of breath. 6.7 g 3   ARIPiprazole  ER (ABILIFY  MAINTENA) 400 MG PRSY prefilled syringe Inject 400 mg into the muscle every 28 (twenty-eight) days.     azelastine  (ASTELIN ) 0.1 % nasal spray Place 2 sprays into both nostrils 2 (two) times daily. Use in each nostril as directed 30 mL 6   cetirizine  (ZYRTEC ) 10 MG tablet TAKE 1 TABLET (10 MG TOTAL) BY MOUTH EVERY MORNING. 90 tablet 0   chlorhexidine  (HIBICLENS ) 4 % external liquid Apply 15 mLs (1 Application total) topically as directed for 30 doses. Use as directed daily for 5 days every other week for 6 weeks. 946 mL 1   citalopram  (CELEXA ) 20 MG tablet Take 1 tablet (20 mg total) by mouth daily. 90 tablet 0   fenofibrate  (TRICOR ) 145 MG tablet TAKE 1 TABLET BY MOUTH EVERY DAY 90 tablet 0   finasteride  (PROSCAR ) 5 MG tablet TAKE 1 TABLET (5 MG TOTAL) BY MOUTH DAILY. 90 tablet 1   HYDROcodone  bit-homatropine (HYCODAN) 5-1.5 MG/5ML syrup Take 5 mLs by mouth every 8 (eight) hours as needed for cough. 120 mL 0   ipratropium-albuterol  (DUONEB) 0.5-2.5 (3) MG/3ML SOLN INHALE 3 ML BY NEBULIZER EVERY 6 HOURS AS NEEDED 360 mL 0   losartan  (COZAAR ) 50 MG tablet TAKE 1 TABLET BY MOUTH EVERY DAY IN THE MORNING 90 tablet 0   metFORMIN  (GLUCOPHAGE ) 500 MG tablet TAKE 1 TABLET (500 MG TOTAL) BY MOUTH DAILY. 90 tablet 0   Multiple Vitamin (MULTIVITAMIN WITH MINERALS) TABS tablet Take 1 tablet by mouth daily with breakfast.     Multiple Vitamins-Minerals (PRESERVISION AREDS 2 PO) Take 1 capsule by mouth 2 (two) times daily.     pantoprazole  (PROTONIX ) 40  MG tablet Take 1 tablet (40 mg total) by mouth 2  (two) times daily. 180 tablet 3   tamsulosin  (FLOMAX ) 0.4 MG CAPS capsule TAKE 1 CAPSULE BY MOUTH EVERY DAY 90 capsule 1   temazepam  (RESTORIL ) 15 MG capsule Take 1 capsule (15 mg total) by mouth at bedtime. 90 capsule 0   VASCEPA  1 g capsule TAKE 2 CAPSULES BY MOUTH TWICE DAILY 360 capsule 3   Current Facility-Administered Medications on File Prior to Visit  Medication Dose Route Frequency Provider Last Rate Last Admin   ARIPiprazole  ER (ABILIFY  MAINTENA) 400 MG prefilled syringe 400 mg  400 mg Intramuscular Q28 days Plovsky, Elna, MD   400 mg at 06/18/24 1029    BP 130/60   Pulse 83   Temp 98.2 F (36.8 C) (Oral)   Ht 5' 10 (1.778 m)   Wt 254 lb (115.2 kg)   SpO2 97%   BMI 36.45 kg/m       Objective:   Physical Exam Vitals and nursing note reviewed.  Constitutional:      Appearance: Normal appearance.  Cardiovascular:     Rate and Rhythm: Normal rate and regular rhythm.     Pulses: Normal pulses.     Heart sounds: Normal heart sounds.  Pulmonary:     Effort: Pulmonary effort is normal.     Breath sounds: Normal breath sounds. No wheezing or rhonchi.  Chest:     Comments: Bandage over incision from pacemaker noted on left chest wall  Musculoskeletal:        General: Normal range of motion.  Skin:    General: Skin is warm and dry.  Neurological:     General: No focal deficit present.     Mental Status: He is alert and oriented to person, place, and time.  Psychiatric:        Mood and Affect: Mood normal.        Behavior: Behavior normal.        Thought Content: Thought content normal.        Judgment: Judgment normal.        Assessment & Plan:  1. Postural dizziness with presyncope (Primary) - Reviewed hospital notes, discharge instructions, labs, imaging and medication changes. All questions answered to the best of my ability  - Follow up with Cardiology as directed  2. Essential hypertension - Will have him cut his losartan  in half until he is seen by  cardiology   3. Dyslipidemia - Continue Fenofibrate  and Vascepa    4. DOE (dyspnea on exertion) - Continue to monitor   5. Chronic cough - Advised using nebulizer since this seems to be helping   6. Prediabetes - Continue with Metformin  500 mg daily - Continue with weight loss measures  Justise Ehmann, NP

## 2024-07-18 ENCOUNTER — Ambulatory Visit (HOSPITAL_COMMUNITY)

## 2024-07-18 VITALS — BP 127/78 | HR 80 | Resp 22 | Wt 254.0 lb

## 2024-07-18 DIAGNOSIS — F319 Bipolar disorder, unspecified: Secondary | ICD-10-CM | POA: Diagnosis not present

## 2024-07-18 NOTE — Patient Instructions (Signed)
 Pt presents today for due Abilify  Maintena 400 mg injection. Pt is appropriate and cooperative on approach. Affect appropriate to mood. Pt has been dealing with cardiac and respiratory issues and very recently had a cardiac pacemaker implanted. Pt continues to intentionally lose weight he says. Denies SI/HI/AVH or mood lability. Injection was prepared as ordered and administered in RUOQ without complaint. Support and encouragement provided. Pt to return in 28 days for next due injection.

## 2024-07-24 ENCOUNTER — Ambulatory Visit: Attending: Cardiology

## 2024-07-24 DIAGNOSIS — I442 Atrioventricular block, complete: Secondary | ICD-10-CM

## 2024-07-24 NOTE — Patient Instructions (Signed)
  After Your Pacemaker   Monitor your pacemaker site for redness, swelling, and drainage. Call the device clinic at [phone] if you experience these symptoms or fever/chills.  Your incision was closed with Steri-strips or staples:  You may shower 7 days after your procedure and wash your incision with soap and water . Avoid lotions, ointments, or perfumes over your incision until it is well-healed.  You may use a hot tub or a pool after your wound check appointment if the incision is completely closed.  Do not lift, push or pull greater than 10 pounds with the affected arm until 6 weeks after your procedure. UNTIL AFTER DECEMBER 4TH.  There are no other restrictions in arm movement after your wound check appointment.  You may drive, unless driving has been restricted by your healthcare providers.  Remote monitoring is used to monitor your pacemaker from home. This monitoring is scheduled every 91 days by our office. It allows us  to keep an eye on the functioning of your device to ensure it is working properly. You will routinely see your Electrophysiologist annually (more often if necessary).

## 2024-07-24 NOTE — Progress Notes (Signed)
 Normal dual chamber pacemaker wound check. Presenting rhythm: AS/VS 66. Wound well healed. Routine testing performed. Thresholds, sensing, and impedance consistent with implant measurements and at 3.5V safety margin/auto capture until 3 month visit. No episodes. Reviewed arm restrictions to continue for 6 weeks total post op.  Pt enrolled in remote follow-up.

## 2024-07-25 DIAGNOSIS — H353212 Exudative age-related macular degeneration, right eye, with inactive choroidal neovascularization: Secondary | ICD-10-CM | POA: Diagnosis not present

## 2024-07-25 DIAGNOSIS — H43813 Vitreous degeneration, bilateral: Secondary | ICD-10-CM | POA: Diagnosis not present

## 2024-07-25 DIAGNOSIS — Z961 Presence of intraocular lens: Secondary | ICD-10-CM | POA: Diagnosis not present

## 2024-07-25 DIAGNOSIS — H353221 Exudative age-related macular degeneration, left eye, with active choroidal neovascularization: Secondary | ICD-10-CM | POA: Diagnosis not present

## 2024-07-25 LAB — CUP PACEART INCLINIC DEVICE CHECK
Date Time Interrogation Session: 20251105154118
Implantable Lead Connection Status: 753985
Implantable Lead Connection Status: 753985
Implantable Lead Implant Date: 20251023
Implantable Lead Implant Date: 20251023
Implantable Lead Location: 753859
Implantable Lead Location: 753860
Implantable Lead Model: 7841
Implantable Lead Model: 7842
Implantable Lead Serial Number: 1520700
Implantable Lead Serial Number: 1671053
Implantable Pulse Generator Implant Date: 20251023
Lead Channel Impedance Value: 738 Ohm
Lead Channel Impedance Value: 869 Ohm
Lead Channel Pacing Threshold Amplitude: 0.9 V
Lead Channel Pacing Threshold Amplitude: 1.2 V
Lead Channel Pacing Threshold Pulse Width: 0.4 ms
Lead Channel Pacing Threshold Pulse Width: 0.4 ms
Lead Channel Sensing Intrinsic Amplitude: 11.1 mV
Lead Channel Sensing Intrinsic Amplitude: 22.2 mV
Lead Channel Setting Pacing Amplitude: 3.5 V
Lead Channel Setting Pacing Amplitude: 3.5 V
Lead Channel Setting Pacing Pulse Width: 0.4 ms
Lead Channel Setting Sensing Sensitivity: 2.5 mV
Pulse Gen Serial Number: 203673
Zone Setting Status: 755011

## 2024-08-01 ENCOUNTER — Telehealth (HOSPITAL_BASED_OUTPATIENT_CLINIC_OR_DEPARTMENT_OTHER): Admitting: Psychiatry

## 2024-08-01 ENCOUNTER — Encounter (HOSPITAL_COMMUNITY): Payer: Self-pay | Admitting: Psychiatry

## 2024-08-01 VITALS — Wt 244.0 lb

## 2024-08-01 DIAGNOSIS — F319 Bipolar disorder, unspecified: Secondary | ICD-10-CM | POA: Diagnosis not present

## 2024-08-01 DIAGNOSIS — F411 Generalized anxiety disorder: Secondary | ICD-10-CM | POA: Diagnosis not present

## 2024-08-01 DIAGNOSIS — F5101 Primary insomnia: Secondary | ICD-10-CM

## 2024-08-01 MED ORDER — ARIPIPRAZOLE ER 400 MG IM PRSY
400.0000 mg | PREFILLED_SYRINGE | INTRAMUSCULAR | 3 refills | Status: AC
Start: 2024-08-01 — End: 2024-11-21

## 2024-08-01 MED ORDER — TEMAZEPAM 15 MG PO CAPS
15.0000 mg | ORAL_CAPSULE | Freq: Every day | ORAL | 0 refills | Status: AC
Start: 2024-08-01 — End: ?

## 2024-08-01 MED ORDER — CITALOPRAM HYDROBROMIDE 20 MG PO TABS
20.0000 mg | ORAL_TABLET | Freq: Every day | ORAL | 0 refills | Status: AC
Start: 2024-08-01 — End: 2024-10-30

## 2024-08-01 NOTE — Progress Notes (Signed)
 [facility] MD Virtual Progress Note   Patient Location: Home Provider Location: Office  I connect with patient by video and verified that I am speaking with correct person by using two identifiers. I discussed the limitations of evaluation and management by telemedicine and the availability of in person appointments. I also discussed with the patient that there may be a patient responsible charge related to this service. The patient expressed understanding and agreed to proceed.  Sean Hill [id number] 83 y.o.  08/01/2024 10:31 AM  History of Present Illness:  Patient is evaluated by video session.  He reported things are much better since he find out that his shortness of breath was due to heart issue and now he has a pacemaker.  He went to the hospital after not feeling well and given the diagnosis of bradycardia.  He is doing much better.  He is ready for upcoming holidays.  He reported his daughters, their husband's and sisters wife coming.  He denies any mania, psychosis, hallucination.  He denies any crying spells or any feeling of hopelessness or worthlessness.  He is trying to lose the weight on his own and his target is to lose 2 to 3 pounds every few days.  He cut down his portion and trying to be more active.  He lost more than 15 pounds since the last visit.  His last hemoglobin A1c 5.3.  He has no tremor or shakes or any EPS.  He is taking Celexa , temazepam  and Abilify  injection.  He does not get overwhelmed or anxious.  He sleeps good with the help of temazepam .  He denies drinking or using any illegal substances.  He like to keep his current medication.  Lives with his wife who is supportive.  Past Psychiatric History: H/O multiple hospitalization.  Last inpatient in July 2014. H/O overdose on Latuda  with alcohol. H/O cutting wrist.  Took Tegretol , Depakote , Ambien, Remeron , Vistaril , Geodon , Abilify , lithium , Provigil, Zoloft, Neurontin , Lamictal , Wellbutrin ,  Risperdal  and Pristiq. H/O mania, aggression, getting speeding tickets, excessive buying and impulsive behavior   Past Medical History:  Diagnosis Date   Anal fissure    Anxiety    Arthritis    right wrist; pt. 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 x 1 yr.   Impaired hearing    Left bundle branch block (LBBB) on electrocardiogram 10/29/2015   Macular degeneration (senile) of retina    left   Pre-diabetes     Outpatient Encounter Medications as of 08/01/2024  Medication Sig   albuterol  (VENTOLIN  HFA) 108 (90 Base) MCG/ACT inhaler Inhale 2 puffs into the lungs every 6 (six) hours as needed for wheezing or shortness of breath.   ARIPiprazole  ER (ABILIFY  MAINTENA) 400 MG PRSY prefilled syringe Inject 400 mg into the muscle every 28 (twenty-eight) days.   azelastine  (ASTELIN ) 0.1 % nasal spray Place 2 sprays into both nostrils 2 (two) times daily. Use in each nostril as directed   cetirizine  (ZYRTEC ) 10 MG tablet TAKE 1 TABLET (10 MG TOTAL) BY MOUTH EVERY MORNING.   chlorhexidine  (HIBICLENS )  4 % external liquid Apply 15 mLs (1 Application total) topically as directed for 30 doses. Use as directed daily for 5 days every other week for 6 weeks.   citalopram  (CELEXA ) 20 MG tablet Take 1 tablet (20 mg total) by mouth daily.   fenofibrate  (TRICOR ) 145 MG tablet TAKE 1 TABLET BY MOUTH EVERY DAY   finasteride  (PROSCAR ) 5 MG tablet TAKE 1 TABLET (5 MG TOTAL) BY MOUTH DAILY.   HYDROcodone  bit-homatropine (HYCODAN) 5-1.5 MG/5ML syrup Take 5 mLs by mouth every 8 (eight) hours as needed for cough.   ipratropium-albuterol  (DUONEB) 0.5-2.5 (3) MG/3ML  SOLN INHALE 3 ML BY NEBULIZER EVERY 6 HOURS AS NEEDED   losartan  (COZAAR ) 50 MG tablet TAKE 1 TABLET BY MOUTH EVERY DAY IN THE MORNING (Patient taking differently: Take 25 mg by mouth daily.)   metFORMIN  (GLUCOPHAGE ) 500 MG tablet TAKE 1 TABLET (500 MG TOTAL) BY MOUTH DAILY.   Multiple Vitamin (MULTIVITAMIN WITH MINERALS) TABS tablet Take 1 tablet by mouth daily with breakfast.   Multiple Vitamins-Minerals (PRESERVISION AREDS 2 PO) Take 1 capsule by mouth 2 (two) times daily.   pantoprazole  (PROTONIX ) 40 MG tablet Take 1 tablet (40 mg total) by mouth 2 (two) times daily.   tamsulosin  (FLOMAX ) 0.4 MG CAPS capsule TAKE 1 CAPSULE BY MOUTH EVERY DAY   temazepam  (RESTORIL ) 15 MG capsule Take 1 capsule (15 mg total) by mouth at bedtime.   VASCEPA  1 g capsule TAKE 2 CAPSULES BY MOUTH TWICE DAILY   Facility-Administered Encounter Medications as of 08/01/2024  Medication   ARIPiprazole  ER (ABILIFY  MAINTENA) 400 MG prefilled syringe 400 mg    Recent Results (from the past 2160 hours)  Basic metabolic panel     Status: Abnormal   Collection Time: 07/08/24  2:32 PM  Result Value Ref Range   Sodium 140 135 - 145 mmol/L   Potassium 3.7 3.5 - 5.1 mmol/L   Chloride 111 98 - 111 mmol/L   CO2 18 (L) 22 - 32 mmol/L   Glucose, Bld 100 (H) 70 - 99 mg/dL    Comment: Glucose reference range applies only to samples taken after fasting for at least 8 hours.   BUN 16 8 - 23 mg/dL   Creatinine, Ser 8.97 0.61 - 1.24 mg/dL   Calcium  8.7 (L) 8.9 - 10.3 mg/dL   GFR, Estimated >39 >39 mL/min    Comment: (NOTE) Calculated using the CKD-EPI Creatinine Equation (2021)    Anion gap 11 5 - 15    Comment: Performed at [facility], [address], [place], [place] [address]  CBC     Status: Abnormal   Collection Time: 07/08/24  2:32 PM  Result Value Ref Range   WBC 4.2 4.0 - 10.5 K/uL   RBC 4.42 4.22 - 5.81 MIL/uL   Hemoglobin 12.9 (L) 13.0 - 17.0 g/dL   HCT 61.7 (L) 60.9 - 47.9 %   MCV 86.4 80.0 - 100.0 fL    MCH 29.2 26.0 - 34.0 pg   MCHC 33.8 30.0 - 36.0 g/dL   RDW 85.8 88.4 - 84.4 %   Platelets 199 150 - 400 K/uL   nRBC 0.0 0.0 - 0.2 %    Comment: Performed at [facility], [address], [place], [place] [address]  Troponin I (High Sensitivity)     Status: None   Collection Time: 07/08/24  2:32 PM  Result Value Ref Range   Troponin I (High Sensitivity) 5 <18 ng/L  Comment: (NOTE) Elevated high sensitivity troponin I (hsTnI) values and significant  changes across serial measurements may suggest ACS but many other  chronic and acute conditions are known to elevate hsTnI results.  Refer to the Links section for chest pain algorithms and additional  guidance. Performed at [facility], [address], [place], [place] [address]   Troponin I (High Sensitivity)     Status: None   Collection Time: 07/08/24  5:16 PM  Result Value Ref Range   Troponin I (High Sensitivity) 6 <18 ng/L    Comment: (NOTE) Elevated high sensitivity troponin I (hsTnI) values and significant  changes across serial measurements may suggest ACS but many other  chronic and acute conditions are known to elevate hsTnI results.  Refer to the Links section for chest pain algorithms and additional  guidance. Performed at [facility], [address] [place], [place] [address]   Magnesium      Status: None   Collection Time: 07/08/24  5:16 PM  Result Value Ref Range   Magnesium  1.7 1.7 - 2.4 mg/dL    Comment: Performed at [facility], [address] [place], [place] [address]  Brain natriuretic peptide     Status: None   Collection Time: 07/08/24  5:16 PM  Result Value Ref Range   B Natriuretic Peptide 34.9 0.0 - 100.0 pg/mL    Comment: Performed at [facility], [address], [place], [place] [address]  TSH     Status: None   Collection Time: 07/08/24  5:16 PM  Result Value Ref Range   TSH 1.331 0.350 - 4.500 uIU/mL    Comment: Performed by a 3rd Generation assay with  a functional sensitivity of <=0.01 uIU/mL. Performed at [facility], [address] [place], [place] [address]   Basic metabolic panel     Status: Abnormal   Collection Time: 07/09/24  5:55 AM  Result Value Ref Range   Sodium 139 135 - 145 mmol/L   Potassium 4.1 3.5 - 5.1 mmol/L   Chloride 106 98 - 111 mmol/L   CO2 22 22 - 32 mmol/L   Glucose, Bld 103 (H) 70 - 99 mg/dL    Comment: Glucose reference range applies only to samples taken after fasting for at least 8 hours.   BUN 19 8 - 23 mg/dL   Creatinine, Ser 8.89 0.61 - 1.24 mg/dL   Calcium  9.3 8.9 - 10.3 mg/dL   GFR, Estimated >39 >39 mL/min    Comment: (NOTE) Calculated using the CKD-EPI Creatinine Equation (2021)    Anion gap 11 5 - 15    Comment: Performed at [facility], [address], [place], [place] [address]  CBC     Status: None   Collection Time: 07/09/24  5:55 AM  Result Value Ref Range   WBC 7.9 4.0 - 10.5 K/uL   RBC 4.82 4.22 - 5.81 MIL/uL   Hemoglobin 14.0 13.0 - 17.0 g/dL   HCT 58.5 60.9 - 47.9 %   MCV 85.9 80.0 - 100.0 fL   MCH 29.0 26.0 - 34.0 pg   MCHC 33.8 30.0 - 36.0 g/dL   RDW 85.9 88.4 - 84.4 %   Platelets 242 150 - 400 K/uL   nRBC 0.0 0.0 - 0.2 %    Comment: Performed at [facility], [address] [place], [place] [address]  Lipid panel     Status: Abnormal   Collection Time:  07/09/24  5:55 AM  Result Value Ref Range   Cholesterol 187 0 - 200 mg/dL   Triglycerides 811 (H) <150 mg/dL   HDL 30 (L) >59 mg/dL   Total CHOL/HDL Ratio 6.2 RATIO   VLDL 38 0 - 40 mg/dL   LDL Cholesterol 880 (H) 0 - 99 mg/dL    Comment:        Total Cholesterol/HDL:CHD Risk Coronary Heart Disease Risk Table                     Men   Women  1/2 Average Risk   3.4   3.3  Average Risk       5.0   4.4  2 X Average Risk   9.6   7.1  3 X Average Risk  23.4   11.0        Use the calculated Patient Ratio above and the CHD Risk Table to determine the patient's CHD Risk.        ATP III  CLASSIFICATION (LDL):  <100     mg/dL   Optimal  899-870  mg/dL   Near or Above                    Optimal  130-159  mg/dL   Borderline  839-810  mg/dL   High  >809     mg/dL   Very High Performed at [facility], [address], [place], [place] [address]   ECHOCARDIOGRAM COMPLETE     Status: None   Collection Time: 07/09/24  2:46 PM  Result Value Ref Range   Weight 4,028.25 oz   Height 70 in   BP 139/73 mmHg   S' Lateral 4.50 cm   Area-P 1/2 2.83 cm2   Est EF 55 - 60%   Hemoglobin A1c     Status: None   Collection Time: 07/09/24  3:06 PM  Result Value Ref Range   Hgb A1c MFr Bld 5.3 4.8 - 5.6 %    Comment: (NOTE) Diagnosis of Diabetes The following HbA1c ranges recommended by the American Diabetes Association (ADA) may be used as an aid in the diagnosis of diabetes mellitus.  Hemoglobin             Suggested A1C NGSP%              Diagnosis  <5.7                   Non Diabetic  5.7-6.4                Pre-Diabetic  >6.4                   Diabetic  <7.0                   Glycemic control for                       adults with diabetes.     Mean Plasma Glucose 105.41 mg/dL    Comment: Performed at [facility], [address], [place], [place] [address]  Glucose, capillary     Status: Abnormal   Collection Time: 07/09/24  4:03 PM  Result Value Ref Range   Glucose-Capillary 105 (H) 70 - 99 mg/dL    Comment: Glucose reference range applies only to samples taken after fasting for at least 8 hours.  Glucose, capillary  Status: Abnormal   Collection Time: 07/09/24 10:08 PM  Result Value Ref Range   Glucose-Capillary 148 (H) 70 - 99 mg/dL    Comment: Glucose reference range applies only to samples taken after fasting for at least 8 hours.   Comment 1 Notify RN    Comment 2 Document in Chart   Glucose, capillary     Status: Abnormal   Collection Time: 07/10/24  6:21 AM  Result Value Ref Range   Glucose-Capillary 114 (H) 70 - 99 mg/dL    Comment:  Glucose reference range applies only to samples taken after fasting for at least 8 hours.  Basic metabolic panel     Status: Abnormal   Collection Time: 07/10/24  7:36 AM  Result Value Ref Range   Sodium 139 135 - 145 mmol/L   Potassium 3.9 3.5 - 5.1 mmol/L   Chloride 106 98 - 111 mmol/L   CO2 23 22 - 32 mmol/L   Glucose, Bld 110 (H) 70 - 99 mg/dL    Comment: Glucose reference range applies only to samples taken after fasting for at least 8 hours.   BUN 18 8 - 23 mg/dL   Creatinine, Ser 8.84 0.61 - 1.24 mg/dL   Calcium  9.2 8.9 - 10.3 mg/dL   GFR, Estimated >39 >39 mL/min    Comment: (NOTE) Calculated using the CKD-EPI Creatinine Equation (2021)    Anion gap 10 5 - 15    Comment: Performed at [facility], [address] [place], [place] [address]  CBC     Status: None   Collection Time: 07/10/24  7:36 AM  Result Value Ref Range   WBC 5.5 4.0 - 10.5 K/uL   RBC 4.93 4.22 - 5.81 MIL/uL   Hemoglobin 14.4 13.0 - 17.0 g/dL   HCT 57.8 60.9 - 47.9 %   MCV 85.4 80.0 - 100.0 fL   MCH 29.2 26.0 - 34.0 pg   MCHC 34.2 30.0 - 36.0 g/dL   RDW 85.9 88.4 - 84.4 %   Platelets 249 150 - 400 K/uL   nRBC 0.0 0.0 - 0.2 %    Comment: Performed at [facility], [address] [place], [place] [address]  Glucose, capillary     Status: Abnormal   Collection Time: 07/10/24 12:13 PM  Result Value Ref Range   Glucose-Capillary 119 (H) 70 - 99 mg/dL    Comment: Glucose reference range applies only to samples taken after fasting for at least 8 hours.  Surgical PCR screen     Status: None   Collection Time: 07/10/24  1:06 PM   Specimen: Nasal Mucosa; Nasal Swab  Result Value Ref Range   MRSA, PCR NEGATIVE NEGATIVE   Staphylococcus aureus NEGATIVE NEGATIVE    Comment: (NOTE) The Xpert SA Assay (FDA approved for NASAL specimens in patients 79 years of age and older), is one component of a comprehensive surveillance program. It is not intended to diagnose infection nor to guide or monitor  treatment. Performed at [facility], [address], [place], [place] [address]   Glucose, capillary     Status: Abnormal   Collection Time: 07/10/24  3:39 PM  Result Value Ref Range   Glucose-Capillary 100 (H) 70 - 99 mg/dL    Comment: Glucose reference range applies only to samples taken after fasting for at least 8 hours.  Glucose, capillary     Status: None   Collection Time: 07/10/24  9:55 PM  Result Value Ref  Range   Glucose-Capillary 98 70 - 99 mg/dL    Comment: Glucose reference range applies only to samples taken after fasting for at least 8 hours.  Basic metabolic panel     Status: Abnormal   Collection Time: 07/11/24  2:33 AM  Result Value Ref Range   Sodium 141 135 - 145 mmol/L   Potassium 3.8 3.5 - 5.1 mmol/L   Chloride 108 98 - 111 mmol/L   CO2 24 22 - 32 mmol/L   Glucose, Bld 106 (H) 70 - 99 mg/dL    Comment: Glucose reference range applies only to samples taken after fasting for at least 8 hours.   BUN 15 8 - 23 mg/dL   Creatinine, Ser 8.85 0.61 - 1.24 mg/dL   Calcium  9.2 8.9 - 10.3 mg/dL   GFR, Estimated >39 >39 mL/min    Comment: (NOTE) Calculated using the CKD-EPI Creatinine Equation (2021)    Anion gap 9 5 - 15    Comment: Performed at [facility], [address] [place], [place] [address]  CBC     Status: Abnormal   Collection Time: 07/11/24  2:33 AM  Result Value Ref Range   WBC 5.0 4.0 - 10.5 K/uL   RBC 4.51 4.22 - 5.81 MIL/uL   Hemoglobin 13.3 13.0 - 17.0 g/dL   HCT 61.2 (L) 60.9 - 47.9 %   MCV 85.8 80.0 - 100.0 fL   MCH 29.5 26.0 - 34.0 pg   MCHC 34.4 30.0 - 36.0 g/dL   RDW 85.9 88.4 - 84.4 %   Platelets 227 150 - 400 K/uL   nRBC 0.0 0.0 - 0.2 %    Comment: Performed at [facility], [address] [place], [place] [address]  Magnesium      Status: None   Collection Time: 07/11/24  2:33 AM  Result Value Ref Range   Magnesium  1.7 1.7 - 2.4 mg/dL    Comment: Performed at [facility], [address] [place],  [place] [address]  Glucose, capillary     Status: Abnormal   Collection Time: 07/11/24  6:17 AM  Result Value Ref Range   Glucose-Capillary 106 (H) 70 - 99 mg/dL    Comment: Glucose reference range applies only to samples taken after fasting for at least 8 hours.  Glucose, capillary     Status: Abnormal   Collection Time: 07/11/24 11:21 AM  Result Value Ref Range   Glucose-Capillary 108 (H) 70 - 99 mg/dL    Comment: Glucose reference range applies only to samples taken after fasting for at least 8 hours.  Glucose, capillary     Status: Abnormal   Collection Time: 07/11/24  4:18 PM  Result Value Ref Range   Glucose-Capillary 118 (H) 70 - 99 mg/dL    Comment: Glucose reference range applies only to samples taken after fasting for at least 8 hours.  Glucose, capillary     Status: Abnormal   Collection Time: 07/11/24  9:50 PM  Result Value Ref Range   Glucose-Capillary 101 (H) 70 - 99 mg/dL    Comment: Glucose reference range applies only to samples taken after fasting for at least 8 hours.  Basic metabolic panel     Status: Abnormal   Collection Time: 07/12/24  2:33 AM  Result Value Ref Range   Sodium 139 135 - 145 mmol/L   Potassium 3.8 3.5 - 5.1 mmol/L   Chloride 106 98 - 111 mmol/L   CO2 23 22 -  32 mmol/L   Glucose, Bld 105 (H) 70 - 99 mg/dL    Comment: Glucose reference range applies only to samples taken after fasting for at least 8 hours.   BUN 15 8 - 23 mg/dL   Creatinine, Ser 8.80 0.61 - 1.24 mg/dL   Calcium  8.7 (L) 8.9 - 10.3 mg/dL   GFR, Estimated >39 >39 mL/min    Comment: (NOTE) Calculated using the CKD-EPI Creatinine Equation (2021)    Anion gap 10 5 - 15    Comment: Performed at [facility], [address] [place], [place] [address]  CBC     Status: Abnormal   Collection Time: 07/12/24  2:33 AM  Result Value Ref Range   WBC 5.7 4.0 - 10.5 K/uL   RBC 4.50 4.22 - 5.81 MIL/uL   Hemoglobin 13.0 13.0 - 17.0 g/dL   HCT 61.2 (L) 60.9 - 47.9 %   MCV 86.0 80.0 - 100.0  fL   MCH 28.9 26.0 - 34.0 pg   MCHC 33.6 30.0 - 36.0 g/dL   RDW 86.0 88.4 - 84.4 %   Platelets 226 150 - 400 K/uL   nRBC 0.0 0.0 - 0.2 %    Comment: Performed at [facility], [address] [place], [place] [address]  Glucose, capillary     Status: Abnormal   Collection Time: 07/12/24  6:08 AM  Result Value Ref Range   Glucose-Capillary 112 (H) 70 - 99 mg/dL    Comment: Glucose reference range applies only to samples taken after fasting for at least 8 hours.  CUP PACEART INCLINIC DEVICE CHECK     Status: None   Collection Time: 07/24/24  3:41 PM  Result Value Ref Range   Date Time Interrogation Session [id number]    Pulse Generator Manufacturer BOST    Pulse Gen Model L331 ACCOLADE MRI EL    Pulse Gen Serial Number [id number]    Clinic Name [facility]    Implantable Pulse Generator Type Implantable Pulse Generator    Implantable Pulse Generator Implant Date 79748976    Implantable Lead Manufacturer BOST    Implantable Lead Model 7841 Ingevity + MRI    Implantable Lead Serial Number [id number]    Implantable Lead Implant Date 79748976    Implantable Lead Location Detail 1 UNKNOWN    Implantable Lead Location [id number]    Implantable Lead Connection Status [id number]    Implantable Lead Manufacturer BOST    Implantable Lead Model 7842 Ingevity + MRI    Implantable Lead Serial Number [id number]    Implantable Lead Implant Date 79748976    Implantable Lead Location Detail 1 UNKNOWN    Implantable Lead Special Function Left Bundle Lead    Implantable Lead Location [id number]    Implantable Lead Connection Status [phone]    Lead Channel Setting Sensing Sensitivity 2.5 mV   Lead Channel Setting Sensing Adaptation Mode Fixed Pacing    Lead Channel Setting Pacing Amplitude 3.5 V   Lead Channel Setting Pacing Pulse Width 0.4 ms   Lead Channel Setting Pacing Amplitude 3.5 V   Zone Setting Status [id number]    Lead Channel Impedance Value 738.0 ohm   Lead Channel Sensing Intrinsic Amplitude  11.1 mV   Lead Channel Pacing Threshold Amplitude 0.9000 V   Lead Channel Pacing Threshold Pulse Width 0.4 ms   Lead Channel Impedance Value 869.0 ohm   Lead Channel Sensing Intrinsic Amplitude 22.2 mV   Lead Channel Pacing Threshold Amplitude 1.2000 V  Lead Channel Pacing Threshold Pulse Width 0.4 ms   Battery Status BOS    Eval Rhythm AS/VS 66      Psychiatric Specialty Exam: Physical Exam  Review of Systems  Weight 244 lb (110.7 kg).There is no height or weight on file to calculate BMI.  General Appearance: Casual  Eye Contact:  Fair  Speech:  Slow  Volume:  Decreased  Mood:  Euthymic  Affect:  Appropriate  Thought Process:  Goal Directed  Orientation:  Full (Time, Place, and Person)  Thought Content:  WDL  Suicidal Thoughts:  No  Homicidal Thoughts:  No  Memory:  Immediate;   Good Recent;   Good Remote;   Fair  Judgement:  Intact  Insight:  Present  Psychomotor Activity:  Decreased  Concentration:  Concentration: Fair and Attention Span: Fair  Recall:  [employer] of Knowledge:  Fair  Language:  Good  Akathisia:  No  Handed:  Right  AIMS (if indicated):     Assets:  Communication Skills Desire for Improvement Housing Social Support Transportation  ADL's:  Intact  Cognition:  WNL  Sleep: Good with temazepam        07/15/2024   11:56 AM 01/17/2023   11:18 AM 01/03/2023    8:40 AM 09/26/2022    2:12 PM 07/29/2022    1:44 PM  Depression screen PHQ 2/9  Decreased Interest 0 2 2 0 0  Down, Depressed, Hopeless 0 1 1 0 0  PHQ - 2 Score 0 3 3 0 0  Altered sleeping  3 0    Tired, decreased energy  1 3    Change in appetite  1 2    Feeling bad or failure about yourself   0 0    Trouble concentrating  1 0    Moving slowly or fidgety/restless  1 0    Suicidal thoughts  0 0    PHQ-9 Score  10  8     Difficult doing work/chores  Somewhat difficult Not difficult at all       Data saved with a previous flowsheet row definition    Assessment/Plan: Bipolar I  disorder (HCC) - Plan: ARIPiprazole  ER (ABILIFY  MAINTENA) 400 MG PRSY prefilled syringe, temazepam  (RESTORIL ) 15 MG capsule  GAD (generalized anxiety disorder) - Plan: citalopram  (CELEXA ) 20 MG tablet, temazepam  (RESTORIL ) 15 MG capsule  Primary insomnia - Plan: temazepam  (RESTORIL ) 15 MG capsule  Patient is 83 year old Caucasian married man with history of hypertension, 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  I reviewed blood work results from recent hospitalization.  His hemoglobin A1c 5.3.  He had lost weight as he trying and he feels more active with increased energy.  He does not want to change the medication since symptoms are under control.  Continue Celexa  20 mg daily, Abilify  400 mg intramuscular every 28 days and temazepam  15 mg at bedtime.  Discussed medication side effects and benefits.  Recommend to call back if is any question or any concern.  Follow-up in 3 months.   Follow Up Instructions:     I discussed the assessment and treatment plan with the patient. The patient was provided an opportunity to ask questions and all were answered. The patient agreed with the plan and demonstrated an understanding of the instructions.   The patient was advised to call back or seek an in-person evaluation if the symptoms worsen or if the condition fails to improve as anticipated.  Collaboration of Care: Other provider involved in patient's care AEB notes are available in epic to review  Patient/Guardian was advised Release of Information must be obtained prior to any record release in order to collaborate their care with an outside provider. Patient/Guardian was advised if they have not already done so to contact the registration department to sign all necessary forms in order for us  to release information regarding their care.   Consent: Patient/Guardian gives verbal consent for treatment and  assignment of benefits for services provided during this visit. Patient/Guardian expressed understanding and agreed to proceed.     Total encounter time 18 minutes which includes face-to-face time, chart reviewed, care coordination, order entry and documentation during this encounter.   Note: This document was prepared by [employer] voice dictation technology and any errors that results from this process are unintentional.    Leni ONEIDA Client, MD 08/01/2024

## 2024-08-06 ENCOUNTER — Other Ambulatory Visit (HOSPITAL_COMMUNITY): Payer: Self-pay | Admitting: Psychiatry

## 2024-08-06 DIAGNOSIS — F411 Generalized anxiety disorder: Secondary | ICD-10-CM

## 2024-08-12 ENCOUNTER — Other Ambulatory Visit: Payer: Self-pay | Admitting: Adult Health

## 2024-08-20 ENCOUNTER — Encounter (HOSPITAL_COMMUNITY): Payer: Self-pay | Admitting: *Deleted

## 2024-08-20 ENCOUNTER — Ambulatory Visit (HOSPITAL_COMMUNITY)

## 2024-08-20 VITALS — BP 156/74 | HR 65 | Ht 67.0 in | Wt 252.0 lb

## 2024-08-20 DIAGNOSIS — F319 Bipolar disorder, unspecified: Secondary | ICD-10-CM | POA: Diagnosis not present

## 2024-08-20 NOTE — Patient Instructions (Signed)
 Pt in clinic today for due Abilify  Maintena 400 mg injection. Pt appropriate, cooperative on approach. Affect baseline anxious, intermittent eye contact. Pt denies any lability, depression, or mania, since last injection. Pt did say that his anxiety has been high due to the Thanksgiving family gathering. Pt continues to diet, low impact exercise to lose weight as Medicare will not approve GLP-1's. Pacer implant would site healing appropriately. Injection prepared as ordered and given in [facility] without complaint. Pt to return in 28 days for next due injection. Support and encouragement provided.

## 2024-08-22 ENCOUNTER — Ambulatory Visit: Attending: Cardiovascular Disease

## 2024-08-22 ENCOUNTER — Ambulatory Visit: Payer: Self-pay | Admitting: Cardiovascular Disease

## 2024-08-22 LAB — CUP PACEART REMOTE DEVICE CHECK
Battery Remaining Longevity: 168 mo
Battery Remaining Percentage: 100 %
Brady Statistic RA Percent Paced: 22 %
Brady Statistic RV Percent Paced: 0 %
Date Time Interrogation Session: 20251204041100
Implantable Lead Connection Status: 753985
Implantable Lead Connection Status: 753985
Implantable Lead Implant Date: 20251023
Implantable Lead Implant Date: 20251023
Implantable Lead Location: 753859
Implantable Lead Location: 753860
Implantable Lead Model: 7841
Implantable Lead Model: 7842
Implantable Lead Serial Number: 1520700
Implantable Lead Serial Number: 1671053
Implantable Pulse Generator Implant Date: 20251023
Lead Channel Impedance Value: 720 Ohm
Lead Channel Impedance Value: 855 Ohm
Lead Channel Pacing Threshold Amplitude: 0.5 V
Lead Channel Pacing Threshold Amplitude: 1.3 V
Lead Channel Pacing Threshold Pulse Width: 0.4 ms
Lead Channel Pacing Threshold Pulse Width: 0.4 ms
Lead Channel Setting Pacing Amplitude: 3.5 V
Lead Channel Setting Pacing Amplitude: 3.5 V
Lead Channel Setting Pacing Pulse Width: 0.4 ms
Lead Channel Setting Sensing Sensitivity: 2.5 mV
Pulse Gen Serial Number: 203673
Zone Setting Status: 755011

## 2024-08-28 ENCOUNTER — Encounter: Payer: Self-pay | Admitting: Adult Health

## 2024-08-28 ENCOUNTER — Ambulatory Visit: Payer: PPO

## 2024-08-28 VITALS — Ht 70.0 in | Wt 245.0 lb

## 2024-08-28 DIAGNOSIS — Z0000 Encounter for general adult medical examination without abnormal findings: Secondary | ICD-10-CM | POA: Diagnosis not present

## 2024-08-28 NOTE — Telephone Encounter (Signed)
 Please advise

## 2024-08-28 NOTE — Progress Notes (Signed)
 Chief Complaint  Patient presents with   Medicare Wellness     Subjective:   Sean Hill is a 83 y.o. male who presents for a Medicare Annual Wellness Visit.  Visit info / Clinical Intake: Medicare Wellness Visit Type:: Subsequent Annual Wellness Visit Persons participating in visit and providing information:: patient Medicare Wellness Visit Mode:: Telephone If telephone:: video declined Since this visit was completed virtually, some vitals may be partially provided or unavailable. Missing vitals are due to the limitations of the virtual format.: Documented vitals are patient reported If Telephone or Video please confirm:: I connected with patient using audio/video enable telemedicine. I verified patient identity with two identifiers, discussed telehealth limitations, and patient agreed to proceed. Patient Location:: Home Provider Location:: Office Interpreter Needed?: No Pre-visit prep was completed: yes AWV questionnaire completed by patient prior to visit?: yes Date:: 08/25/24 Living arrangements:: lives with spouse/significant other Patient's Overall Health Status Rating: good Typical amount of pain: (!) a lot (Back pain. Followed by medical attention) Does pain affect daily life?: (!) yes (Followed by medical attention) Are you currently prescribed opioids?: (!) yes  Dietary Habits and Nutritional Risks How many meals a day?: 3 Eats fruit and vegetables daily?: (!) no Most meals are obtained by: having others provide food (Wife) In the last 2 weeks, have you had any of the following?: none Diabetic:: (!) yes Any non-healing wounds?: no How often do you check your BS?: as needed Would you like to be referred to a Nutritionist or for Diabetic Management? : no  Functional Status Activities of Daily Living (to include ambulation/medication): Independent Ambulation: Independent with device- listed below Home Assistive Devices/Equipment: Eyeglasses; Other (Comment);  Dentures (specify type) (Hearing Aids) Medication Administration: Independent Home Management (perform basic housework or laundry): Independent Manage your own finances?: yes Primary transportation is: family / friends Concerns about vision?: no *vision screening is required for WTM* Concerns about hearing?: (!) yes Uses hearing aids?: (!) yes Hear whispered voice?: (!) no *in-person visit only*  Fall Screening Falls in the past year?: 1 Number of falls in past year: 0 Was there an injury with Fall?: 0 Fall Risk Category Calculator: 1 Patient Fall Risk Level: Low Fall Risk  Fall Risk Patient at Risk for Falls Due to: Mental status change Fall risk Follow up: Falls evaluation completed  Home and Transportation Safety: All rugs have non-skid backing?: N/A, no rugs All stairs or steps have railings?: N/A, no stairs Grab bars in the bathtub or shower?: yes Have non-skid surface in bathtub or shower?: yes Good home lighting?: yes Regular seat belt use?: yes Hospital stays in the last year:: (!) yes How many hospital stays:: 1 Reason: Pace maker  Cognitive Assessment Difficulty concentrating, remembering, or making decisions? : no Will 6CIT or Mini Cog be Completed: yes What year is it?: 0 points What month is it?: 0 points Give patient an address phrase to remember (5 components): [address] [place] About what time is it?: 0 points Count backwards from 20 to 1: 0 points Say the months of the year in reverse: 0 points Repeat the address phrase from earlier: 0 points 6 CIT Score: 0 points  Advance Directives (For Healthcare) Does Patient Have a Medical Advance Directive?: No Would patient like information on creating a medical advance directive?: No - Patient declined  Reviewed/Updated  Reviewed/Updated: Reviewed All (Medical, Surgical, Family, Medications, Allergies, Care Teams, Patient Goals)    Allergies (verified) Penicillins, Lisinopril , and Zetia  [ezetimibe ]  Current Medications (verified) Outpatient Encounter Medications as of 08/28/2024  Medication Sig   albuterol  (VENTOLIN  HFA) 108 (90 Base) MCG/ACT inhaler Inhale 2 puffs into the lungs every 6 (six) hours as needed for wheezing or shortness of breath.   ARIPiprazole  ER (ABILIFY  MAINTENA) 400 MG PRSY prefilled syringe Inject 400 mg into the muscle every 28 (twenty-eight) days.   azelastine  (ASTELIN ) 0.1 % nasal spray Place 2 sprays into both nostrils 2 (two) times daily. Use in each nostril as directed   cetirizine  (ZYRTEC ) 10 MG tablet TAKE 1 TABLET (10 MG TOTAL) BY MOUTH EVERY MORNING.   chlorhexidine  (HIBICLENS ) 4 % external liquid Apply 15 mLs (1 Application total) topically as directed for 30 doses. Use as directed daily for 5 days every other week for 6 weeks.   citalopram  (CELEXA ) 20 MG tablet Take 1 tablet (20 mg total) by mouth daily.   fenofibrate  (TRICOR ) 145 MG tablet TAKE 1 TABLET BY MOUTH EVERY DAY   finasteride  (PROSCAR ) 5 MG tablet TAKE 1 TABLET (5 MG TOTAL) BY MOUTH DAILY.   HYDROcodone  bit-homatropine (HYCODAN) 5-1.5 MG/5ML syrup Take 5 mLs by mouth every 8 (eight) hours as needed for cough.   ipratropium-albuterol  (DUONEB) 0.5-2.5 (3) MG/3ML SOLN INHALE 3 ML BY NEBULIZER EVERY 6 HOURS AS NEEDED   losartan  (COZAAR ) 50 MG tablet TAKE 1 TABLET BY MOUTH EVERY DAY IN THE MORNING (Patient taking differently: Take 25 mg by mouth daily.)   metFORMIN  (GLUCOPHAGE ) 500 MG tablet TAKE 1 TABLET (500 MG TOTAL) BY MOUTH DAILY.   Multiple Vitamin (MULTIVITAMIN WITH MINERALS) TABS tablet Take 1 tablet by mouth daily with breakfast.   Multiple Vitamins-Minerals (PRESERVISION AREDS 2 PO) Take 1 capsule by mouth 2 (two) times daily.   pantoprazole  (PROTONIX ) 40 MG tablet Take 1 tablet (40 mg total) by mouth 2 (two) times daily.   tamsulosin  (FLOMAX ) 0.4 MG CAPS capsule TAKE 1 CAPSULE BY MOUTH EVERY DAY   temazepam  (RESTORIL ) 15 MG capsule Take 1 capsule (15 mg total) by mouth at bedtime.    VASCEPA  1 g capsule TAKE 2 CAPSULES BY MOUTH TWICE DAILY   Facility-Administered Encounter Medications as of 08/28/2024  Medication   ARIPiprazole  ER (ABILIFY  MAINTENA) 400 MG prefilled syringe 400 mg    History: Past Medical History:  Diagnosis Date   Anal fissure    Anxiety    Arthritis    right wrist; pt. 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 x 1 yr.   Impaired hearing    Left bundle branch block (LBBB) on electrocardiogram 10/29/2015   Macular degeneration (senile) of retina    left   Pre-diabetes    Past Surgical History:  Procedure Laterality Date   ANKLE FUSION Bilateral    ANKLE SURGERY Bilateral    ligament surgery   BIOPSY  04/29/2020   Procedure: BIOPSY;  Surgeon: San Sandor GAILS, DO;  Location: [facility];  Service: Gastroenterology;;   BIOPSY  07/14/2022   Procedure: BIOPSY;  Surgeon: San Sandor GAILS, DO;  Location: [facility];  Service: Gastroenterology;;   CARDIAC CATHETERIZATION  x  2   1983; 11/14/2003   CARPOMETACARPEL SUSPENSION PLASTY Right 09/22/2016   Procedure: right thumb carpometacarpal  ARTHROPLASTY;  Surgeon: Franky Curia, MD;  Location: [facility];  Service: Orthopedics;  Laterality: Right;  right thumb carpometacarpal  ARTHROPLASTY   CARPOMETACARPEL SUSPENSION PLASTY Right 11/02/2017   Procedure: RIGHT THUMB SUSPENSIONPLASTY WITH TIGHTROPE, DISTAL POLE SCAPHOID  EXCISION;  Surgeon: Curia Franky, MD;  Location: [facility];  Service: Orthopedics;  Laterality: Right;   COLONOSCOPY WITH PROPOFOL  N/A 10/02/2019   Procedure: COLONOSCOPY WITH PROPOFOL ;  Surgeon:  San Sandor GAILS, DO;  Location: [facility];  Service: Gastroenterology;  Laterality: N/A;   COLONOSCOPY WITH PROPOFOL  N/A 10/09/2019   Procedure: COLONOSCOPY WITH PROPOFOL ;  Surgeon: Avram Lupita BRAVO, MD;  Location: [facility];  Service: Endoscopy;  Laterality: N/A;   COLONOSCOPY WITH PROPOFOL  N/A 04/29/2020   Procedure: COLONOSCOPY WITH PROPOFOL ;  Surgeon: San Sandor GAILS, DO;  Location: [facility];  Service: Gastroenterology;  Laterality: N/A;   COLONOSCOPY WITH PROPOFOL  N/A 11/11/2021   Procedure: COLONOSCOPY WITH PROPOFOL ;  Surgeon: San Sandor GAILS, DO;  Location: [facility];  Service: Gastroenterology;  Laterality: N/A;   ELBOW SURGERY Left    ENDOSCOPIC MUCOSAL RESECTION N/A 10/02/2019   Procedure: ENDOSCOPIC MUCOSAL RESECTION;  Surgeon: San Sandor GAILS, DO;  Location: [facility];  Service: Gastroenterology;  Laterality: N/A;   ESOPHAGOGASTRODUODENOSCOPY (EGD) WITH PROPOFOL  N/A 07/14/2022   Procedure: ESOPHAGOGASTRODUODENOSCOPY (EGD) WITH PROPOFOL ;  Surgeon: San Sandor GAILS, DO;  Location: [facility];  Service: Gastroenterology;  Laterality: N/A;   HEMOSTASIS CLIP PLACEMENT  10/09/2019   Procedure: HEMOSTASIS CLIP PLACEMENT;  Surgeon: Avram Lupita BRAVO, MD;  Location: [facility];  Service: Endoscopy;;   HEMOSTASIS CLIP PLACEMENT  11/11/2021   Procedure: HEMOSTASIS CLIP PLACEMENT;  Surgeon: San Sandor GAILS, DO;  Location: [facility];  Service: Gastroenterology;;   HEMOSTASIS CLIP PLACEMENT  07/14/2022   Procedure: HEMOSTASIS CLIP PLACEMENT;  Surgeon: San Sandor GAILS, DO;  Location: [facility];  Service: Gastroenterology;;   INGUINAL HERNIA REPAIR Right    KNEE SURGERY     OPEN REDUCTION INTERNAL FIXATION (ORIF) DISTAL RADIAL FRACTURE Right 10/29/2015   Procedure: OPEN REDUCTION INTERNAL FIXATION (ORIF) DISTAL RADIAL FRACTURE;  Surgeon: Franky Curia, MD;  Location: [facility];  Service: Orthopedics;  Laterality: Right;   ORIF ELBOW FRACTURE  Right    PACEMAKER IMPLANT N/A 07/11/2024   Procedure: PACEMAKER IMPLANT;  Surgeon: Cindie Ole DASEN, MD;  Location: [facility];  Service: Cardiovascular;  Laterality: N/A;   POLYPECTOMY  04/29/2020   Procedure: POLYPECTOMY;  Surgeon: San Sandor GAILS, DO;  Location: [facility];  Service: Gastroenterology;;   POLYPECTOMY  11/11/2021   Procedure: POLYPECTOMY;  Surgeon: San Sandor GAILS, DO;  Location: [facility];  Service: Gastroenterology;;   POLYPECTOMY  07/14/2022   Procedure: POLYPECTOMY;  Surgeon: San Sandor GAILS, DO;  Location: [facility];  Service: Gastroenterology;;   REVERSE SHOULDER ARTHROPLASTY Left 11/10/2023   Procedure: REVERSE SHOULDER ARTHROPLASTY;  Surgeon: Kay Kemps, MD;  Location: [facility];  Service: Orthopedics;  Laterality: Left;  interscalene block, flip room   SAVORY DILATION N/A 07/14/2022   Procedure: SAVORY DILATION;  Surgeon: San Sandor GAILS, DO;  Location: [facility];  Service: Gastroenterology;  Laterality: N/A;   SHOULDER ARTHROSCOPY     SUBMUCOSAL LIFTING INJECTION  10/02/2019   Procedure: SUBMUCOSAL LIFTING INJECTION;  Surgeon: San Sandor GAILS, DO;  Location: [facility];  Service: Gastroenterology;;   TONSILLECTOMY     TOTAL KNEE ARTHROPLASTY Bilateral  TOTAL KNEE REVISION Right 08/25/2021   Procedure: Right knee polyethylene vs total knee arthroplasty revision;  Surgeon: Melodi Lerner, MD;  Location: [facility];  Service: Orthopedics;  Laterality: Right;   ULNAR COLLATERAL LIGAMENT REPAIR Right 12/29/2015   Procedure: REPAIR  RIGHT LATERAL ULNAR COLLATERAL LIGAMENT TEAR  EXTENSOR ORIGIN ;  Surgeon: Franky Curia, MD;  Location: [facility];  Service: Orthopedics;  Laterality: Right;   WRIST ARTHROSCOPY WITH DEBRIDEMENT Right 07/14/2016   Procedure: RIGHT WRIST ARTHROSCOPY WITH DEBRIDEMENT  TRIANGULAR FIBROCARTILAGE COMPLEX;  Surgeon: Franky Curia, MD;  Location: [facility];  Service: Orthopedics;   Laterality: Right;   Family History  Problem Relation Age of Onset   Alcohol abuse Mother    Ovarian cancer Mother    Alcohol abuse Father    Pancreatic cancer Father    Hyperlipidemia Brother    Barrett's esophagus Brother    Colon cancer Maternal Grandmother    Depression Daughter    Paranoid behavior Daughter    Esophageal cancer Neg Hx    Stomach cancer Neg Hx    Rectal cancer Neg Hx    Colon polyps Neg Hx    Social History   Occupational History   Not on file  Tobacco Use   Smoking status: Never   Smokeless tobacco: Never  Vaping Use   Vaping status: Never Used  Substance and Sexual Activity   Alcohol use: Not Currently    Comment: occasional.   Drug use: No   Sexual activity: Not Currently    Birth control/protection: None   Tobacco Counseling Counseling given: No  SDOH Screenings   Food Insecurity: No Food Insecurity (08/28/2024)  Housing: Low Risk  (08/28/2024)  Transportation Needs: No Transportation Needs (08/28/2024)  Utilities: Not At Risk (08/28/2024)  Alcohol Screen: Low Risk  (01/02/2023)  Depression (PHQ2-9): Low Risk  (08/28/2024)  Financial Resource Strain: Low Risk  (08/25/2024)  Physical Activity: Inactive (08/28/2024)  Social Connections: Moderately Isolated (08/28/2024)  Stress: No Stress Concern Present (08/28/2024)  Tobacco Use: Low Risk  (08/28/2024)  Health Literacy: Adequate Health Literacy (08/28/2024)   See flowsheets for full screening details  Depression Screen PHQ 2 & 9 Depression Scale- Over the past 2 weeks, how often have you been bothered by any of the following problems? Little interest or pleasure in doing things: 0 Feeling down, depressed, or hopeless (PHQ Adolescent also includes...irritable): 0 PHQ-2 Total Score: 0     Goals Addressed               This Visit's Progress     Remain active (pt-stated)               Objective:    Today's Vitals   08/28/24 0920  Weight: 245 lb (111.1 kg)  Height: 5'  10 (1.778 m)   Body mass index is 35.15 kg/m.  Hearing/Vision screen Hearing Screening - Comments:: Wears Hearing Aids Vision Screening - Comments:: Wears rx glasses - up to date with routine eye exams with  Dr Larnell Immunizations and Health Maintenance Health Maintenance  Topic Date Due   COVID-19 Vaccine (10 - 2025-26 season) 12/24/2024   Medicare Annual Wellness (AWV)  08/28/2025   DTaP/Tdap/Td (3 - Td or Tdap) 10/28/2025   Pneumococcal Vaccine: 50+ Years  Completed   Influenza Vaccine  Completed   Zoster Vaccines- Shingrix  Completed   Meningococcal B Vaccine  Aged Out   Hepatitis C Screening  Discontinued        Assessment/Plan:  This is a routine wellness examination for Ermon.  Patient Care Team: Merna Huxley, NP as PCP - General (Family Medicine) Lavona Agent, MD as PCP - Cardiology (Cardiology) Murrell Drivers, MD as Consulting Physician (Orthopedic Surgery) Lionell Jon DEL, [facility] (Pharmacist)  I have personally reviewed and noted the following in the patients chart:   Medical and social history Use of alcohol, tobacco or illicit drugs  Current medications and supplements including opioid prescriptions. Functional ability and status Nutritional status Physical activity Advanced directives List of other physicians Hospitalizations, surgeries, and ER visits in previous 12 months Vitals Screenings to include cognitive, depression, and falls Referrals and appointments  No orders of the defined types were placed in this encounter.  In addition, I have reviewed and discussed with patient certain preventive protocols, quality metrics, and best practice recommendations. A written personalized care plan for preventive services as well as general preventive health recommendations were provided to patient.   Rojelio LELON Blush, LPN   [id number]   Return in 53 weeks (on 09/03/2025).  After Visit Summary: (MyChart) Due to this being a telephonic visit, the after  visit summary with patients personalized plan was offered to patient via MyChart   Nurse Notes: No voiced or noted concerns at this time

## 2024-08-28 NOTE — Progress Notes (Signed)
 Remote PPM Transmission

## 2024-08-28 NOTE — Patient Instructions (Addendum)
 Mr. Sean Hill,  Thank you for taking the time for your Medicare Wellness Visit. I appreciate your continued commitment to your health goals. Please review the care plan we discussed, and feel free to reach out if I can assist you further.  Please note that Annual Wellness Visits do not include a physical exam. Some assessments may be limited, especially if the visit was conducted virtually. If needed, we may recommend an in-person follow-up with your provider.  Ongoing Care Seeing your primary care provider every 3 to 6 months helps us  monitor your health and provide consistent, personalized care.   Referrals If a referral was made during today's visit and you haven't received any updates within two weeks, please contact the referred provider directly to check on the status.  Recommended Screenings:  Health Maintenance  Topic Date Due   COVID-19 Vaccine (10 - 2025-26 season) 12/24/2024   Medicare Annual Wellness Visit  08/28/2025   DTaP/Tdap/Td vaccine (3 - Td or Tdap) 10/28/2025   Pneumococcal Vaccine for age over 75  Completed   Flu Shot  Completed   Zoster (Shingles) Vaccine  Completed   Meningitis B Vaccine  Aged Out   Hepatitis C Screening  Discontinued   Opioid Pain Medicine Management Opioids are powerful medicines that are used to treat moderate to severe pain. When used for short periods of time, they can help you to: Sleep better. Do better in physical or occupational therapy. Feel better in the first few days after an injury. Recover from surgery. Opioids should be taken with the supervision of a trained health care provider. They should be taken for the shortest period of time possible. This is because opioids can be addictive, and the longer you take opioids, the greater your risk of addiction. This addiction can also be called opioid use disorder. What are the risks? Using opioid pain medicines for longer than 3 days increases your risk of side effects. Side effects  include: Constipation. Nausea and vomiting. Breathing difficulties (respiratory depression). Drowsiness. Confusion. Opioid use disorder. Itching. Taking opioid pain medicine for a long period of time can affect your ability to do daily tasks. It also puts you at risk for: Motor vehicle crashes. Depression. Suicide. Heart attack. Overdose, which can be life-threatening. What is a pain treatment plan? A pain treatment plan is an agreement between you and your health care provider. Pain is unique to each person, and treatments vary depending on your condition. To manage your pain, you and your health care provider need to work together. To help you do this: Discuss the goals of your treatment, including how much pain you might expect to have and how you will manage the pain. Review the risks and benefits of taking opioid medicines. Remember that a good treatment plan uses more than one approach and minimizes the chance of side effects. Be honest about the amount of medicines you take and about any drug or alcohol use. Get pain medicine prescriptions from only one health care provider. Pain can be managed with many types of alternative treatments. Ask your health care provider to refer you to one or more specialists who can help you manage pain through: Physical or occupational therapy. Counseling (cognitive behavioral therapy). Good nutrition. Biofeedback. Massage. Meditation. Non-opioid medicine. Following a gentle exercise program. How to use opioid pain medicine Taking medicine Take your pain medicine exactly as told by your health care provider. Take it only when you need it. If your pain gets less severe, you may take less  than your prescribed dose if your health care provider approves. If you are not having pain, do nottake pain medicine unless your health care provider tells you to take it. If your pain is severe, do nottry to treat it yourself by taking more pills than  instructed on your prescription. Contact your health care provider for help. Write down the times when you take your pain medicine. It is easy to become confused while on pain medicine. Writing the time can help you avoid overdose. Take other over-the-counter or prescription medicines only as told by your health care provider. Keeping yourself and others safe  While you are taking opioid pain medicine: Do not drive, use machinery, or power tools. Do not sign legal documents. Do not drink alcohol. Do not take sleeping pills. Do not supervise children by yourself. Do not do activities that require climbing or being in high places. Do not go to a lake, river, ocean, spa, or swimming pool. Do not share your pain medicine with anyone. Keep pain medicine in a locked cabinet or in a secure area where pets and children cannot reach it. Stopping your use of opioids If you have been taking opioid medicine for more than a few weeks, you may need to slowly decrease (taper) how much you take until you stop completely. Tapering your use of opioids can decrease your risk of symptoms of withdrawal, such as: Pain and cramping in the abdomen. Nausea. Sweating. Sleepiness. Restlessness. Uncontrollable shaking (tremors). Cravings for the medicine. Do not attempt to taper your use of opioids on your own. Talk with your health care provider about how to do this. Your health care provider may prescribe a step-down schedule based on how much medicine you are taking and how long you have been taking it. Getting rid of leftover pills Do not save any leftover pills. Get rid of leftover pills safely by: Taking the medicine to a prescription take-back program. This is usually offered by the county or law enforcement. Bringing them to a pharmacy that has a drug disposal container. Flushing them down the toilet. Check the label or package insert of your medicine to see whether this is safe to do. Throwing them out in  the trash. Check the label or package insert of your medicine to see whether this is safe to do. If it is safe to throw it out, remove the medicine from the original container, put it into a sealable bag or container, and mix it with used coffee grounds, food scraps, dirt, or cat litter before putting it in the trash. Follow these instructions at home: Activity Do exercises as told by your health care provider. Avoid activities that make your pain worse. Return to your normal activities as told by your health care provider. Ask your health care provider what activities are safe for you. General instructions You may need to take these actions to prevent or treat constipation: Drink enough fluid to keep your urine pale yellow. Take over-the-counter or prescription medicines. Eat foods that are high in fiber, such as beans, whole grains, and fresh fruits and vegetables. Limit foods that are high in fat and processed sugars, such as fried or sweet foods. Keep all follow-up visits. This is important. Where to find support If you have been taking opioids for a long time, you may benefit from receiving support for quitting from a local support group or counselor. Ask your health care provider for a referral to these resources in your area. Where to find more  information Centers for Disease Control and Prevention (CDC): footballexhibition.com.br [place] Food and Drug Administration (FDA): pumpkinsearch.com.ee Get help right away if: You may have taken too much of an opioid (overdosed). Common symptoms of an overdose: Your breathing is slower or more shallow than normal. You have a very slow heartbeat (pulse). You have slurred speech. You have nausea and vomiting. Your pupils become very small. You have other potential symptoms: You are very confused. You faint or feel like you will faint. You have cold, clammy skin. You have blue lips or fingernails. You have thoughts of harming yourself or harming others. These  symptoms may represent a serious problem that is an emergency. Do not wait to see if the symptoms will go away. Get medical help right away. Call your local emergency services (911 in the [place] Do not drive yourself to the hospital.  If you ever feel like you may hurt yourself or others, or have thoughts about taking your own life, get help right away. Go to your nearest emergency department or: Call your local emergency services (911 in the [place] Call the [facility] (321-603-3734 in the [place] Call a suicide crisis helpline, such as the National Suicide Prevention Lifeline at 680-633-5586 or 988 in the [place] This is open 24 hours a day in the [place] If youre a Veteran: Call 988 and press 1. This is open 24 hours a day. Text the [employer] at 810-300-2921. Summary Opioid medicines can help you manage moderate to severe pain for a short period of time. A pain treatment plan is an agreement between you and your health care provider. Discuss the goals of your treatment, including how much pain you might expect to have and how you will manage the pain. If you think that you or someone else may have taken too much of an opioid, get medical help right away. This information is not intended to replace advice given to you by your health care provider. Make sure you discuss any questions you have with your health care provider. Document Revised: 06/12/2023 Document Reviewed: 12/16/2020 Elsevier Patient Education  2024 Elsevier Inc.    08/28/2024    9:21 AM  Advanced Directives  Does Patient Have a Medical Advance Directive? No  Would patient like information on creating a medical advance directive? No - Patient declined    Vision: Annual vision screenings are recommended for early detection of glaucoma, cataracts, and diabetic retinopathy. These exams can also reveal signs of chronic conditions such as diabetes and high blood pressure.  Dental: Annual dental screenings  help detect early signs of oral cancer, gum disease, and other conditions linked to overall health, including heart disease and diabetes.  Please see the attached documents for additional preventive care recommendations.

## 2024-09-02 LAB — LAB REPORT - SCANNED
Albumin, Urine POC: 18.6
Albumin/Creatinine Ratio, Urine, POC: 24
Creatinine, POC: 78.5 mg/dL

## 2024-09-08 ENCOUNTER — Other Ambulatory Visit: Payer: Self-pay | Admitting: Adult Health

## 2024-09-08 DIAGNOSIS — I10 Essential (primary) hypertension: Secondary | ICD-10-CM

## 2024-09-10 LAB — GENECONNECT MOLECULAR SCREEN: Genetic Analysis Overall Interpretation: NEGATIVE

## 2024-09-16 ENCOUNTER — Telehealth: Payer: Self-pay

## 2024-09-16 DIAGNOSIS — Z860100 Personal history of colon polyps, unspecified: Secondary | ICD-10-CM

## 2024-09-16 MED ORDER — NA SULFATE-K SULFATE-MG SULF 17.5-3.13-1.6 GM/177ML PO SOLN: 1.0000 | Freq: Once | ORAL | 0 refills | Status: AC

## 2024-09-16 NOTE — Telephone Encounter (Signed)
 Patient scheduled for 10-16-24 at 140 to see the doctor and scheduled for 11-07-24 at the hospital for a colonoscopy Case number [id number].   Instructions prep and amb ref done for hx of colonic polyps   Patient will need instructions instructed to him and he was told to call if there are any changes with his ov appointment

## 2024-09-16 NOTE — Telephone Encounter (Signed)
-----   Message from [facility] R sent at 12/05/2023  4:08 PM EDT ----- Regarding: [id number] 1 year flup, GERD, hx of colon polyps, VC

## 2024-09-16 NOTE — Addendum Note (Signed)
 Addended by: KATHIE BOTTCHER E on: 09/16/2024 10:32 AM   Modules accepted: Orders

## 2024-09-18 ENCOUNTER — Ambulatory Visit (HOSPITAL_COMMUNITY): Admitting: *Deleted

## 2024-09-18 VITALS — BP 126/70 | HR 65 | Ht 70.0 in | Wt 255.0 lb

## 2024-09-18 DIAGNOSIS — F319 Bipolar disorder, unspecified: Secondary | ICD-10-CM

## 2024-09-18 NOTE — Progress Notes (Unsigned)
 Patient arrived in clinic today for due injection of Abilify  Maintena 400mg . Patient denies any SI/HI/AVH. Patient continues to diet and lose weight while slightly disappointed in weight gain over the holidays. Patient arrived well groomed and with appropriate affect. Injection was prepared as ordered, given in RUOQ under supervision of [facility], LPN. Patient received injection well and without complaint.    NDC: 40851-927-19 ONU:[id number] EXP:FEB 2028

## 2024-09-20 NOTE — Progress Notes (Addendum)
 COSME JACOB                                          MRN: [id number]   09/20/2024   The [facility]BCI Quality Team Specialist reviewed this patient medical record for the purposes of chart review for care gap closure. The following were reviewed: abstraction for care gap closure-kidney health evaluation for diabetes:eGFR  and uACR. Also abstracted TRC documentation for 07/12/2024 d/c    VBCI Quality Team

## 2024-10-10 ENCOUNTER — Encounter: Payer: Self-pay | Admitting: Pulmonary Disease

## 2024-10-10 ENCOUNTER — Ambulatory Visit: Attending: Pulmonary Disease | Admitting: Cardiology

## 2024-10-10 VITALS — BP 120/80 | HR 60 | Ht 69.0 in | Wt 255.6 lb

## 2024-10-10 DIAGNOSIS — I442 Atrioventricular block, complete: Secondary | ICD-10-CM | POA: Diagnosis not present

## 2024-10-10 DIAGNOSIS — I10 Essential (primary) hypertension: Secondary | ICD-10-CM | POA: Diagnosis not present

## 2024-10-10 LAB — CUP PACEART INCLINIC DEVICE CHECK
Date Time Interrogation Session: 20260122120528
Implantable Lead Connection Status: 753985
Implantable Lead Connection Status: 753985
Implantable Lead Implant Date: 20251023
Implantable Lead Implant Date: 20251023
Implantable Lead Location: 753859
Implantable Lead Location: 753860
Implantable Lead Model: 7841
Implantable Lead Model: 7842
Implantable Lead Serial Number: 1520700
Implantable Lead Serial Number: 1671053
Implantable Pulse Generator Implant Date: 20251023
Lead Channel Impedance Value: 700 Ohm
Lead Channel Impedance Value: 948 Ohm
Lead Channel Pacing Threshold Amplitude: 0.8 V
Lead Channel Pacing Threshold Amplitude: 1.1 V
Lead Channel Pacing Threshold Pulse Width: 0.4 ms
Lead Channel Pacing Threshold Pulse Width: 0.6 ms
Lead Channel Sensing Intrinsic Amplitude: 20.4 mV
Lead Channel Sensing Intrinsic Amplitude: 7.6 mV
Lead Channel Setting Pacing Amplitude: 2 V
Lead Channel Setting Pacing Amplitude: 2.5 V
Lead Channel Setting Pacing Pulse Width: 0.6 ms
Lead Channel Setting Sensing Sensitivity: 2.5 mV
Pulse Gen Serial Number: 203673
Zone Setting Status: 755011

## 2024-10-10 NOTE — Progress Notes (Signed)
" °  Electrophysiology Office Note:   ID:  Sean Hill [birth year], MRN [id number]  Primary Cardiologist: Lynwood Schilling, MD Electrophysiologist: Eulas FORBES Furbish, MD      History of Present Illness:   Sean Hill is a 84 y.o. male with h/o LBBB, CHB, hypertension, hyperlipidemia, GERD, bipolar disorder, anxiety, depression  seen today for routine electrophysiology follow-up s/p Pacemaker implant.  Patient was admitted for symptomatic complete heart block and electrophysiology team asked to see for consideration of PPM implantation. He presented to the ED with EMS after standing from a sitting position and developing dizziness with associated diaphoresis/flushing. He continued to feel lightheaded and dizzy prompting his spouse to check HR, found to be in the 40s. He underwent implantation of a Environmental Manager dual chamber PPM   Since implant, patient reports doing well.  he denies chest pain, palpitations, dyspnea, PND, orthopnea, nausea, vomiting, dizziness, syncope, edema, weight gain, or early satiety.    Review of systems complete and found to be negative unless listed in HPI.   EP Information / Studies Reviewed:    EKG is ordered today. Personal review as below.  EKG Interpretation Date/Time:  Thursday October 10 2024 10:52:37 EST Ventricular Rate:  61 PR Interval:  186 QRS Duration:  156 QT Interval:  450 QTC Calculation: 453 R Axis:   -17  Text Interpretation: ATRIAL PACED RHYTHM Left bundle branch block When compared with ECG of 12-Jul-2024 05:08, PR interval has decreased Confirmed by Trudy [place] [phone]) on 10/10/2024 11:11:59 AM    PPM Interrogation-  reviewed in detail today,  See PACEART report.  Arrhythmia/Device History Dual chamber ppm BSX CL    Physical Exam:   VS:  BP 120/80   Pulse 60   Ht 5' 9 (1.753 m)   Wt 255 lb 9.6 oz (115.9 kg)   SpO2 92%   BMI 37.75 kg/m    Wt Readings from Last 3 Encounters:  10/10/24 255 lb 9.6 oz (115.9 kg)   08/28/24 245 lb (111.1 kg)  07/16/24 254 lb (115.2 kg)     GEN: No acute distress  NECK: No JVD; No carotid bruits CARDIAC: Regular rate and rhythm, no murmurs, rubs, gallops RESPIRATORY:  Clear to auscultation without rales, wheezing or rhonchi  ABDOMEN: Soft, non-tender, non-distended EXTREMITIES:  No edema; No deformity   ASSESSMENT AND PLAN:    CHB s/p [employer] PPM  Normal in-clinic dual chamber pacemaker check. Presenting Rhythm: AP/VS. Routine testing of thresholds, sensing, and impedance demonstrate stable parameters. Atrial outputs changed to chronic phase. RV threshold 1.4V @0 .4ms, 1.1 @0 .6ms. Ventricular  output programmed to 2.5V @0 .6ms. Lower rate limit decreased to 50bpm to reduce overall pacing burden. No episodes. Estimated longevity 15 yrs.   Hypertension Blood pressure is well-controlled on current antihypertensive regimen. Continue current medications and dosing.       Disposition:   Follow up with EP Team in 12 months  Signed, [place] Trudy, PA-C  "

## 2024-10-10 NOTE — Patient Instructions (Signed)
 Medication Instructions:  No medication changes were made during today's visit.  *If you need a refill on your cardiac medications before your next appointment, please call your pharmacy*  Lab Work: No labs were ordered during today's visit.  If you have labs (blood work) drawn today and your tests are completely normal, you will receive your results only by: MyChart Message (if you have MyChart) OR A paper copy in the mail If you have any lab test that is abnormal or we need to change your treatment, we will call you to review the results.  Testing/Procedures: No procedures were ordered during today's visit.   Follow-Up: At [facility], you and your health needs are our priority.  As part of our continuing mission to provide you with exceptional heart care, our providers are all part of one team.  This team includes your primary Cardiologist (physician) and Advanced Practice Providers or APPs (Physician Assistants and Nurse Practitioners) who all work together to provide you with the care you need, when you need it.  Your next appointment:   1 year(s)  Provider:   Artist Pouch   We recommend signing up for the patient portal called MyChart.  Sign up information is provided on this After Visit Summary.  MyChart is used to connect with patients for Virtual Visits (Telemedicine).  Patients are able to view lab/test results, encounter notes, upcoming appointments, etc.  Non-urgent messages can be sent to your provider as well.   To learn more about what you can do with MyChart, go to [email or web address].   Other Instructions

## 2024-10-15 ENCOUNTER — Ambulatory Visit: Payer: Self-pay | Admitting: Cardiovascular Disease

## 2024-10-16 ENCOUNTER — Ambulatory Visit: Admitting: Gastroenterology

## 2024-10-16 ENCOUNTER — Telehealth: Payer: Self-pay

## 2024-10-16 ENCOUNTER — Other Ambulatory Visit (INDEPENDENT_AMBULATORY_CARE_PROVIDER_SITE_OTHER)

## 2024-10-16 ENCOUNTER — Encounter: Payer: Self-pay | Admitting: Gastroenterology

## 2024-10-16 VITALS — BP 138/84 | HR 68 | Ht 70.0 in | Wt 254.0 lb

## 2024-10-16 DIAGNOSIS — Z9189 Other specified personal risk factors, not elsewhere classified: Secondary | ICD-10-CM

## 2024-10-16 DIAGNOSIS — K449 Diaphragmatic hernia without obstruction or gangrene: Secondary | ICD-10-CM

## 2024-10-16 DIAGNOSIS — Z860100 Personal history of colon polyps, unspecified: Secondary | ICD-10-CM

## 2024-10-16 DIAGNOSIS — K219 Gastro-esophageal reflux disease without esophagitis: Secondary | ICD-10-CM

## 2024-10-16 DIAGNOSIS — Z760 Encounter for issue of repeat prescription: Secondary | ICD-10-CM

## 2024-10-16 DIAGNOSIS — I442 Atrioventricular block, complete: Secondary | ICD-10-CM

## 2024-10-16 MED ORDER — PANTOPRAZOLE SODIUM 40 MG PO TBEC: 40.0000 mg | DELAYED_RELEASE_TABLET | Freq: Two times a day (BID) | ORAL | 3 refills | Status: AC

## 2024-10-16 NOTE — Telephone Encounter (Signed)
 Hi Evan, you recently saw this patient in clinic on 10/10/2024. Are you able to comment on surgical clearance for upcoming colonoscopy scheduled for 11/07/2024? Please route your response to P CV DIV PREOP. Thank you!  ~Brandey Vandalen

## 2024-10-16 NOTE — Progress Notes (Signed)
 "  Chief Complaint:    History of colon polyps, discuss colonoscopy, GERD, medication refill  GI History:  84 year old male with history of anxiety, arthritis, asthma, bipolar disorder, depression, difficult airway for intubation, GERD, history of colon polyps, nephrolithiasis, HTN, HLD, LBBB, complete heart block now s/p pacemaker placement, macular degeneration.   Endoscopic history: - Positive Cologuard in 2016 -Colonoscopy (08/2019, Dr. San): 12 tubular adenomas, 3-11 mm, 25-30 mm tubulovillous adenoma in ascending colon (biopsied), diverticulosis, internal hemorrhoids, normal TI -EGD (08/2019, Dr. San): 3 cm HH, irregular Z-line (biopsy: Reflux changes without intestinal metaplasia), Gastric hyperplastic polyps.  Empiric dilation with 51 Fr Savary -CT abdomen/pelvis (09/30/2019): No discrete mass.  No lymphadenopathy. -Colonoscopy (10/02/2019, Dr. San): EMR of 25 mm polyp in ascending colon (TVA), left-sided diverticulosis, internal hemorrhoids -Admitted January 19-21, 2021 with post polypectomy bleed.  Hemodynamically stable, stable hemoglobin.  No blood products needed -Colonoscopy (10/09/2019, Dr. Avram): 20 mm ulcer in proximal ascending colon without active bleeding but positive stigmata.  Treated with 11 clips.  Difficult positioning and significant respiratory-dependent motion in colon. - Colonoscopy (04/29/2020): Tattoo in ascending colon.  Single retained clip in proximal ascending colon 1 cm away from post polypectomy scar.  Underlying tissue with polypoid appearance (path: Inflammatory tissue).  Subtle nodularity around post polypectomy site (path:).  2 mm ascending colon adenoma, 3 mm sigmoid adenoma.  Sigmoid diverticulosis, internal hemorrhoids.  Recommended repeat in 1 year - Colonoscopy (11/11/2021): 5 mm transverse colon polyp (path: Tubular adenoma), Diverticulosis, tattoo and post polypectomy scar in ascending colon.  Repeat 3 years - EGD (07/14/2022): 3 cm  HH, normal esophagus empirically dilated with 18 mm Savary dilator, irregular Z-line at 39 cm (path benign), benign fundic gland and gastric hyperplastic polyps.  If continued/recurrent symptoms, plan for MBS and esophageal manometry - MBS (06/13/2023): No aspiration, penetration, significant residuals. 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 Barium tablet otherwise passed without delay.  No further speech therapy recommendations.  HPI:     Patient is a 84 y.o. male presenting to the Gastroenterology Clinic for follow-up.  Was last seen by me on 12/04/2023.  At that time, reflux well-controlled with Protonix  40 mg twice daily.  Hospital admission in 06/2024 with symptomatic complete heart block and underwent dual-chamber pacemaker placement.  Had follow-up in the Cardiology Clinic on 10/10/2024 with subsequent pacemaker interrogation.  He presents today for routine follow-up and medication refill.  Reflux still well-controlled with Protonix  40 mg twice daily.  Additionally wants to discuss his upcoming colonoscopy for polyp surveillance.  He is scheduled for colonoscopy at [facility] unit on 11/07/2024.  No lower GI symptoms.  30# intentional weight loss over the last 6 months or so through dieting.    Review of systems:     No chest pain, no SOB, no fevers, no urinary sx   Past Medical History:  Diagnosis Date   Anal fissure    Anxiety    Arthritis    right wrist; pt. states everywhere   Asthma    Bipolar disorder (HCC)    Cataract    Depression    Difficult airway for intubation    GERD (gastroesophageal reflux disease)    Hemorrhage of colon following colonoscopy 10/08/2019   History of kidney stones    History of MRSA infection    left knee after arthroplasty   Hyperlipidemia    Hypertension    states under control with med., has been  on med. x 1 yr.   Impaired hearing     Left bundle branch block (LBBB) on electrocardiogram 10/29/2015   Macular degeneration (senile) of retina    left   Pre-diabetes     Patient's surgical history, family medical history, social history, medications and allergies were all reviewed in Epic    Current Outpatient Medications  Medication Sig Dispense Refill   ARIPiprazole  ER (ABILIFY  MAINTENA) 400 MG PRSY prefilled syringe Inject 400 mg into the muscle every 28 (twenty-eight) days. 1 each 3   azelastine  (ASTELIN ) 0.1 % nasal spray Place 2 sprays into both nostrils 2 (two) times daily. Use in each nostril as directed 30 mL 6   cetirizine  (ZYRTEC ) 10 MG tablet TAKE 1 TABLET (10 MG TOTAL) BY MOUTH EVERY MORNING. 90 tablet 0   citalopram  (CELEXA ) 20 MG tablet Take 1 tablet (20 mg total) by mouth daily. 90 tablet 0   fenofibrate  (TRICOR ) 145 MG tablet TAKE 1 TABLET BY MOUTH EVERY DAY 90 tablet 0   finasteride  (PROSCAR ) 5 MG tablet TAKE 1 TABLET (5 MG TOTAL) BY MOUTH DAILY. 90 tablet 1   HYDROcodone  bit-homatropine (HYCODAN) 5-1.5 MG/5ML syrup Take 5 mLs by mouth every 8 (eight) hours as needed for cough. 120 mL 0   ipratropium-albuterol  (DUONEB) 0.5-2.5 (3) MG/3ML SOLN INHALE 3 ML BY NEBULIZER EVERY 6 HOURS AS NEEDED 360 mL 0   losartan  (COZAAR ) 50 MG tablet TAKE 1 TABLET BY MOUTH EVERY DAY IN THE MORNING 90 tablet 1   metFORMIN  (GLUCOPHAGE ) 500 MG tablet TAKE 1 TABLET (500 MG TOTAL) BY MOUTH DAILY. 90 tablet 0   Multiple Vitamin (MULTIVITAMIN WITH MINERALS) TABS tablet Take 1 tablet by mouth daily with breakfast.     Multiple Vitamins-Minerals (PRESERVISION AREDS 2 PO) Take 1 capsule by mouth 2 (two) times daily.     pantoprazole  (PROTONIX ) 40 MG tablet Take 1 tablet (40 mg total) by mouth 2 (two) times daily. 180 tablet 3   tamsulosin  (FLOMAX ) 0.4 MG CAPS capsule TAKE 1 CAPSULE BY MOUTH EVERY DAY 90 capsule 1   temazepam  (RESTORIL ) 15 MG capsule Take 1 capsule (15 mg total) by mouth at bedtime. 90 capsule 0   VASCEPA  1 g capsule TAKE  2 CAPSULES BY MOUTH TWICE DAILY 360 capsule 3   Current Facility-Administered Medications  Medication Dose Route Frequency Provider Last Rate Last Admin   ARIPiprazole  ER (ABILIFY  MAINTENA) 400 MG prefilled syringe 400 mg  400 mg Intramuscular Q28 days Tasia Lung, MD   400 mg at 09/18/24 1124    Physical Exam:     BP 138/84   Pulse 68   Ht 5' 10 (1.778 m)   Wt 254 lb (115.2 kg)   SpO2 95%   BMI 36.45 kg/m   GENERAL:  Pleasant male in NAD PSYCH: : Cooperative, normal affect Musculoskeletal:  Normal muscle tone, normal strength NEURO: Alert and oriented x 3, no focal neurologic deficits   IMPRESSION and PLAN:    1) GERD 2) Hiatal hernia - Refill for Protonix  40 mg twice daily, 90-day supply, RF 5 - Continue current therapy - Continue antireflux lifestyle/dietary modification - Check Vit D, B12, folate, iron panel for routine micronutrient panel on chronic PPI therapy - Recent normal BMP  3) History of colon polyps - Repeat colonoscopy scheduled for 11/07/2024 for ongoing polyp surveillance   4) History of difficult intubation Due to history of difficult airway and Mallampati 4, any endoscopic procedures need to be scheduled at [facility]oscopy unit.  5) Complete heart block s/p pacemaker placement - Will message his Cardiology team for clearance to proceed with colonoscopy next month as scheduled     The indications, risks, and benefits of colonoscopy were explained to the patient in detail. Risks include but are not limited to bleeding, perforation, adverse reaction to medications, and cardiopulmonary compromise. Sequelae include but are not limited to the possibility of surgery, hospitalization, and mortality. The patient verbalized understanding and wished to proceed. All questions answered.    Sandor LULLA Flatter ,DO, FACG 10/16/2024, 1:55 PM  "

## 2024-10-16 NOTE — Telephone Encounter (Signed)
 [facility] Pre-operative Risk Assessment     Request for surgical clearance:     Endoscopy Procedure  What type of surgery is being performed?     Colonoscopy  When is this surgery scheduled?     11-07-24  What type of clearance is required ?   Cardiac Clearance  Practice name and name of physician performing surgery?      [facility]  What is your office phone and fax number?      Phone- [phone]  Fax- [phone]  Anesthesia type (None, local, MAC, general) ?       MAC   Please route your response to Nat SAUNDERS, CMA

## 2024-10-16 NOTE — Patient Instructions (Signed)
 _______________________________________________________  If your blood pressure at your visit was 140/90 or greater, please contact your primary care physician to follow up on this.  _______________________________________________________  If you are age 82 or older, your body mass index should be between 23-30. Your Body mass index is 36.45 kg/m. If this is out of the aforementioned range listed, please consider follow up with your Primary Care Provider.  If you are age 82 or younger, your body mass index should be between 19-25. Your Body mass index is 36.45 kg/m. If this is out of the aformentioned range listed, please consider follow up with your Primary Care Provider.   ________________________________________________________  The [facility] providers would like to encourage you to use MYCHART to communicate with providers for non-urgent requests or questions.  Due to long hold times on the telephone, sending your provider a message by [facility] may be a faster and more efficient way to get a response.  Please allow 48 business hours for a response.  Please remember that this is for non-urgent requests.  _______________________________________________________  [facility] is using a team-based approach to care.  Your team is made up of your doctor and two to three APPS. Our APPS (Nurse Practitioners and Physician Assistants) work with your physician to ensure care continuity for you. They are fully qualified to address your health concerns and develop a treatment plan. They communicate directly with your gastroenterologist to care for you. Seeing the Advanced Practice Practitioners on your physician's team can help you by facilitating care more promptly, often allowing for earlier appointments, access to diagnostic testing, procedures, and other specialty referrals.   Your provider has requested that you go to the basement level for lab work before leaving today. Press B on the  elevator. The lab is located at the first door on the left as you exit the elevator.  Due to recent changes in healthcare laws, you may see the results of your imaging and laboratory studies on MyChart before your provider has had a chance to review them.  We understand that in some cases there may be results that are confusing or concerning to you. Not all laboratory results come back in the same time frame and the provider may be waiting for multiple results in order to interpret others.  Please give us  48 hours in order for your provider to thoroughly review all the results before contacting the office for clarification of your results.   Thank you for entrusting me with your care and choosing [facility].  Dr Teressa

## 2024-10-17 ENCOUNTER — Telehealth: Payer: Self-pay

## 2024-10-17 LAB — B12 AND FOLATE PANEL
Folate: >23.4 ng/mL
Vitamin B-12: 492 pg/mL (ref 211–911)

## 2024-10-17 LAB — IBC + FERRITIN
Ferritin: 94.7 ng/mL (ref 22.0–322.0)
Iron: 91 ug/dL (ref 42–165)
Saturation Ratios: 19.5 % — ABNORMAL LOW (ref 20.0–50.0)
TIBC: 467.6 ug/dL — ABNORMAL HIGH (ref 250.0–450.0)
Transferrin: 334 mg/dL (ref 212.0–360.0)

## 2024-10-17 LAB — VITAMIN D 25 HYDROXY (VIT D DEFICIENCY, FRACTURES): VITD: 51.61 ng/mL (ref 30.00–100.00)

## 2024-10-17 NOTE — Telephone Encounter (Deleted)
 [facility] Pre-operative Risk Assessment     Request for surgical clearance:     Endoscopy Procedure  What type of surgery is being performed?     Colonoscopy  When is this surgery scheduled?     12-13-24  What type of clearance is required ?   Pharmacy  Are there any medications that need to be held prior to surgery and how long? Xarelto x 2 days  Practice name and name of physician performing surgery?      [facility]  What is your office phone and fax number?      Phone- [phone]  Fax- [phone]  Anesthesia type (None, local, MAC, general) ?       MAC   Please route your response to Nat Sic, CMA

## 2024-10-17 NOTE — Telephone Encounter (Signed)
 Error

## 2024-10-21 ENCOUNTER — Telehealth (HOSPITAL_BASED_OUTPATIENT_CLINIC_OR_DEPARTMENT_OTHER): Payer: Self-pay | Admitting: *Deleted

## 2024-10-21 NOTE — Telephone Encounter (Signed)
 Pt has been scheduled tele preop appt 10/30/24. Med rec and consent are done    Patient Consent for Virtual Visit        [name] has provided verbal consent on 10/21/2024 for a virtual visit (video or telephone).   CONSENT FOR VIRTUAL VISIT FOR:  [place]  By participating in this virtual visit I agree to the following:  I hereby voluntarily request, consent and authorize [facility] and its employed or contracted physicians, physician assistants, nurse practitioners or other licensed health care professionals (the Practitioner), to provide me with telemedicine health care services (the Services) as deemed necessary by the treating Practitioner. I acknowledge and consent to receive the Services by the Practitioner via telemedicine. I understand that the telemedicine visit will involve communicating with the Practitioner through live audiovisual communication technology and the disclosure of certain medical information by electronic transmission. I acknowledge that I have been given the opportunity to request an in-person assessment or other available alternative prior to the telemedicine visit and am voluntarily participating in the telemedicine visit.  I understand that I have the right to withhold or withdraw my consent to the use of telemedicine in the course of my care at any time, without affecting my right to future care or treatment, and that the Practitioner or I may terminate the telemedicine visit at any time. I understand that I have the right to inspect all information obtained and/or recorded in the course of the telemedicine visit and may receive copies of available information for a reasonable fee.  I understand that some of the potential risks of receiving the Services via telemedicine include:  Delay or interruption in medical evaluation due to technological equipment failure or disruption; Information transmitted may not be sufficient (e.g. poor  resolution of images) to allow for appropriate medical decision making by the Practitioner; and/or  In rare instances, security protocols could fail, causing a breach of personal health information.  Furthermore, I acknowledge that it is my responsibility to provide information about my medical history, conditions and care that is complete and accurate to the best of my ability. I acknowledge that Practitioner's advice, recommendations, and/or decision may be based on factors not within their control, such as incomplete or inaccurate data provided by me or distortions of diagnostic images or specimens that may result from electronic transmissions. I understand that the practice of medicine is not an exact science and that Practitioner makes no warranties or guarantees regarding treatment outcomes. I acknowledge that a copy of this consent can be made available to me via my patient portal [facility] MyChart), or I can request a printed copy by calling the office of [facility].    I understand that my insurance will be billed for this visit.   I have read or had this consent read to me. I understand the contents of this consent, which adequately explains the benefits and risks of the Services being provided via telemedicine.  I have been provided ample opportunity to ask questions regarding this consent and the Services and have had my questions answered to my satisfaction. I give my informed consent for the services to be provided through the use of telemedicine in my medical care

## 2024-10-21 NOTE — Telephone Encounter (Signed)
 Pt has been scheduled tele preop appt 10/30/24. Med rec and consent are done.

## 2024-10-23 ENCOUNTER — Encounter (HOSPITAL_COMMUNITY): Payer: Self-pay

## 2024-10-23 ENCOUNTER — Ambulatory Visit (HOSPITAL_COMMUNITY)

## 2024-10-23 VITALS — BP 133/74 | HR 67 | Ht 70.0 in | Wt 257.0 lb

## 2024-10-23 DIAGNOSIS — F319 Bipolar disorder, unspecified: Secondary | ICD-10-CM

## 2024-10-23 NOTE — Progress Notes (Cosign Needed)
 Patient arrived today for his due injection of Abilify  Maintena 400 mg. Patient presents well groomed with an appropriate affect. Patient requested his shot be in the RUOQ because he is getting an injection in his left hip today. Patient denies any SI/HI or AVH. Injection was prepared as ordered and administered in the patients RUOQ. Patient tolerated well and without complaint and will return in 28 days.     NDC: 40851-927-07 LOT: [id number] EXP: SEP 2027

## 2024-10-25 ENCOUNTER — Ambulatory Visit: Payer: Self-pay | Admitting: Gastroenterology

## 2024-10-25 ENCOUNTER — Encounter (HOSPITAL_COMMUNITY): Payer: Self-pay | Admitting: Gastroenterology

## 2024-10-25 ENCOUNTER — Encounter: Payer: Self-pay | Admitting: Gastroenterology

## 2024-10-25 NOTE — Telephone Encounter (Signed)
 Spoke with patient and advised in order to do both Endo/Colon we would need to move his date to 3/2.  Patient declined, stating he would just go with his current appointment on 2/19 as his swallowing  :isn't that bad.

## 2024-10-25 NOTE — Progress Notes (Signed)
Understood. Thanks for the update.  

## 2024-10-30 ENCOUNTER — Ambulatory Visit

## 2024-11-07 ENCOUNTER — Ambulatory Visit (HOSPITAL_COMMUNITY): Admit: 2024-11-07 | Admitting: Gastroenterology

## 2024-11-07 ENCOUNTER — Encounter (HOSPITAL_COMMUNITY): Admission: RE | Payer: Self-pay | Source: Home / Self Care

## 2024-11-21 ENCOUNTER — Ambulatory Visit

## 2025-02-20 ENCOUNTER — Ambulatory Visit

## 2025-05-22 ENCOUNTER — Ambulatory Visit

## 2025-08-21 ENCOUNTER — Ambulatory Visit

## 2025-09-03 ENCOUNTER — Ambulatory Visit

## 2025-11-20 ENCOUNTER — Ambulatory Visit
